# Patient Record
Sex: Male | Born: 1955 | Race: White | Hispanic: No | Smoking: Never smoker
Health system: Southern US, Community
[De-identification: ages and names within clinical notes are randomized; demographics above are authoritative.]

## PROBLEM LIST (undated history)

## (undated) DIAGNOSIS — E785 Hyperlipidemia, unspecified: Secondary | ICD-10-CM

## (undated) DIAGNOSIS — I2510 Atherosclerotic heart disease of native coronary artery without angina pectoris: Secondary | ICD-10-CM

## (undated) DIAGNOSIS — G629 Polyneuropathy, unspecified: Secondary | ICD-10-CM

## (undated) DIAGNOSIS — G8929 Other chronic pain: Secondary | ICD-10-CM

## (undated) DIAGNOSIS — I739 Peripheral vascular disease, unspecified: Secondary | ICD-10-CM

## (undated) DIAGNOSIS — F482 Pseudobulbar affect: Secondary | ICD-10-CM

## (undated) DIAGNOSIS — I639 Cerebral infarction, unspecified: Secondary | ICD-10-CM

## (undated) DIAGNOSIS — I82409 Acute embolism and thrombosis of unspecified deep veins of unspecified lower extremity: Secondary | ICD-10-CM

## (undated) DIAGNOSIS — E119 Type 2 diabetes mellitus without complications: Secondary | ICD-10-CM

## (undated) DIAGNOSIS — I219 Acute myocardial infarction, unspecified: Secondary | ICD-10-CM

## (undated) DIAGNOSIS — G5600 Carpal tunnel syndrome, unspecified upper limb: Secondary | ICD-10-CM

## (undated) DIAGNOSIS — M1990 Unspecified osteoarthritis, unspecified site: Secondary | ICD-10-CM

## (undated) DIAGNOSIS — E1169 Type 2 diabetes mellitus with other specified complication: Secondary | ICD-10-CM

## (undated) DIAGNOSIS — M25519 Pain in unspecified shoulder: Secondary | ICD-10-CM

## (undated) DIAGNOSIS — C4490 Unspecified malignant neoplasm of skin, unspecified: Secondary | ICD-10-CM

## (undated) DIAGNOSIS — H269 Unspecified cataract: Secondary | ICD-10-CM

## (undated) DIAGNOSIS — Z87442 Personal history of urinary calculi: Secondary | ICD-10-CM

## (undated) DIAGNOSIS — I10 Essential (primary) hypertension: Secondary | ICD-10-CM

## (undated) DIAGNOSIS — E669 Obesity, unspecified: Secondary | ICD-10-CM

## (undated) HISTORY — DX: Carpal tunnel syndrome, unspecified upper limb: G56.00

## (undated) HISTORY — DX: Pseudobulbar affect: F48.2

## (undated) HISTORY — PX: FEMOROPOPLITEAL THROMBECTOMY / EMBOLECTOMY: SUR432

## (undated) HISTORY — PX: SKIN CANCER EXCISION: SHX779

## (undated) HISTORY — DX: Atherosclerotic heart disease of native coronary artery without angina pectoris: I25.10

## (undated) HISTORY — PX: ORIF RADIUS & ULNA FRACTURES: SHX2129

## (undated) HISTORY — PX: POPLITEAL ARTERY STENT: SHX2243

## (undated) HISTORY — DX: Peripheral vascular disease, unspecified: I73.9

## (undated) HISTORY — DX: Type 2 diabetes mellitus with other specified complication: E11.69

## (undated) HISTORY — PX: LITHOTRIPSY: SUR834

## (undated) HISTORY — DX: Unspecified cataract: H26.9

## (undated) HISTORY — PX: FRACTURE SURGERY: SHX138

## (undated) HISTORY — PX: CYSTOSCOPY: SUR368

## (undated) HISTORY — PX: SHOULDER ARTHROSCOPY: SHX128

## (undated) HISTORY — PX: CORONARY ANGIOPLASTY: SHX604

## (undated) HISTORY — DX: Hyperlipidemia, unspecified: E78.5

## (undated) HISTORY — DX: Obesity, unspecified: E66.9

## (undated) HISTORY — PX: KNEE ARTHROSCOPY: SHX127

## (undated) HISTORY — DX: Acute myocardial infarction, unspecified: I21.9

---

## 1975-07-21 HISTORY — PX: APPENDECTOMY: SHX54

## 1989-03-20 HISTORY — PX: LAPAROSCOPIC CHOLECYSTECTOMY: SUR755

## 1998-08-15 ENCOUNTER — Ambulatory Visit (HOSPITAL_COMMUNITY): Admission: RE | Admit: 1998-08-15 | Discharge: 1998-08-15 | Payer: Self-pay | Admitting: Internal Medicine

## 1998-11-27 ENCOUNTER — Ambulatory Visit (HOSPITAL_COMMUNITY): Admission: RE | Admit: 1998-11-27 | Discharge: 1998-11-27 | Payer: Self-pay | Admitting: Internal Medicine

## 1999-03-21 HISTORY — PX: CARPAL TUNNEL RELEASE: SHX101

## 1999-04-07 ENCOUNTER — Ambulatory Visit (HOSPITAL_COMMUNITY): Admission: RE | Admit: 1999-04-07 | Discharge: 1999-04-07 | Payer: Self-pay | Admitting: Internal Medicine

## 1999-06-23 ENCOUNTER — Encounter: Admission: RE | Admit: 1999-06-23 | Discharge: 1999-06-23 | Payer: Self-pay | Admitting: Internal Medicine

## 1999-06-27 ENCOUNTER — Encounter: Payer: Self-pay | Admitting: Internal Medicine

## 1999-06-27 ENCOUNTER — Encounter: Admission: RE | Admit: 1999-06-27 | Discharge: 1999-06-27 | Payer: Self-pay | Admitting: Internal Medicine

## 1999-11-26 ENCOUNTER — Encounter: Admission: RE | Admit: 1999-11-26 | Discharge: 1999-11-26 | Payer: Self-pay | Admitting: Urology

## 1999-11-26 ENCOUNTER — Encounter: Payer: Self-pay | Admitting: Urology

## 1999-12-02 ENCOUNTER — Ambulatory Visit (HOSPITAL_COMMUNITY): Admission: RE | Admit: 1999-12-02 | Discharge: 1999-12-02 | Payer: Self-pay | Admitting: Urology

## 1999-12-04 ENCOUNTER — Encounter: Payer: Self-pay | Admitting: Urology

## 1999-12-04 ENCOUNTER — Ambulatory Visit (HOSPITAL_COMMUNITY): Admission: RE | Admit: 1999-12-04 | Discharge: 1999-12-04 | Payer: Self-pay | Admitting: Urology

## 1999-12-10 ENCOUNTER — Encounter: Payer: Self-pay | Admitting: Emergency Medicine

## 1999-12-10 ENCOUNTER — Emergency Department (HOSPITAL_COMMUNITY): Admission: EM | Admit: 1999-12-10 | Discharge: 1999-12-10 | Payer: Self-pay | Admitting: Emergency Medicine

## 1999-12-11 ENCOUNTER — Encounter: Payer: Self-pay | Admitting: Urology

## 1999-12-11 ENCOUNTER — Ambulatory Visit (HOSPITAL_COMMUNITY): Admission: RE | Admit: 1999-12-11 | Discharge: 1999-12-11 | Payer: Self-pay | Admitting: Hematology and Oncology

## 1999-12-16 ENCOUNTER — Encounter: Payer: Self-pay | Admitting: Urology

## 1999-12-16 ENCOUNTER — Ambulatory Visit (HOSPITAL_COMMUNITY): Admission: RE | Admit: 1999-12-16 | Discharge: 1999-12-16 | Payer: Self-pay | Admitting: Urology

## 2000-01-02 ENCOUNTER — Encounter: Payer: Self-pay | Admitting: Urology

## 2000-01-02 ENCOUNTER — Inpatient Hospital Stay (HOSPITAL_COMMUNITY): Admission: RE | Admit: 2000-01-02 | Discharge: 2000-01-06 | Payer: Self-pay | Admitting: Urology

## 2000-01-03 ENCOUNTER — Encounter: Payer: Self-pay | Admitting: Urology

## 2000-01-05 ENCOUNTER — Encounter: Payer: Self-pay | Admitting: Urology

## 2000-03-02 ENCOUNTER — Encounter: Admission: RE | Admit: 2000-03-02 | Discharge: 2000-03-02 | Payer: Self-pay | Admitting: *Deleted

## 2000-03-02 ENCOUNTER — Encounter: Payer: Self-pay | Admitting: *Deleted

## 2000-07-09 ENCOUNTER — Encounter: Payer: Self-pay | Admitting: Urology

## 2000-07-09 ENCOUNTER — Encounter: Admission: RE | Admit: 2000-07-09 | Discharge: 2000-07-09 | Payer: Self-pay | Admitting: Urology

## 2000-07-20 HISTORY — PX: CARDIAC CATHETERIZATION: SHX172

## 2000-07-20 HISTORY — PX: CORONARY ARTERY BYPASS GRAFT: SHX141

## 2001-02-17 DIAGNOSIS — I219 Acute myocardial infarction, unspecified: Secondary | ICD-10-CM

## 2001-02-17 HISTORY — DX: Acute myocardial infarction, unspecified: I21.9

## 2001-03-18 ENCOUNTER — Encounter: Admission: RE | Admit: 2001-03-18 | Discharge: 2001-03-18 | Payer: Self-pay | Admitting: Internal Medicine

## 2001-03-18 ENCOUNTER — Encounter: Payer: Self-pay | Admitting: Internal Medicine

## 2001-03-24 ENCOUNTER — Inpatient Hospital Stay (HOSPITAL_COMMUNITY): Admission: EM | Admit: 2001-03-24 | Discharge: 2001-03-30 | Payer: Self-pay | Admitting: *Deleted

## 2001-03-24 ENCOUNTER — Encounter: Payer: Self-pay | Admitting: Thoracic Surgery (Cardiothoracic Vascular Surgery)

## 2001-03-25 ENCOUNTER — Encounter: Payer: Self-pay | Admitting: Thoracic Surgery (Cardiothoracic Vascular Surgery)

## 2001-03-26 ENCOUNTER — Encounter: Payer: Self-pay | Admitting: Thoracic Surgery (Cardiothoracic Vascular Surgery)

## 2001-03-27 ENCOUNTER — Encounter: Payer: Self-pay | Admitting: Surgery

## 2001-04-05 ENCOUNTER — Encounter: Payer: Self-pay | Admitting: Urology

## 2001-04-05 ENCOUNTER — Encounter: Admission: RE | Admit: 2001-04-05 | Discharge: 2001-04-05 | Payer: Self-pay | Admitting: Urology

## 2001-04-26 ENCOUNTER — Encounter (HOSPITAL_COMMUNITY): Admission: RE | Admit: 2001-04-26 | Discharge: 2001-07-25 | Payer: Self-pay | Admitting: *Deleted

## 2002-01-26 ENCOUNTER — Encounter: Payer: Self-pay | Admitting: Emergency Medicine

## 2002-01-26 ENCOUNTER — Emergency Department (HOSPITAL_COMMUNITY): Admission: AC | Admit: 2002-01-26 | Discharge: 2002-01-26 | Payer: Self-pay | Admitting: Emergency Medicine

## 2003-09-05 ENCOUNTER — Encounter: Admission: RE | Admit: 2003-09-05 | Discharge: 2003-09-05 | Payer: Self-pay | Admitting: Orthopedic Surgery

## 2004-02-12 ENCOUNTER — Ambulatory Visit (HOSPITAL_COMMUNITY): Admission: RE | Admit: 2004-02-12 | Discharge: 2004-02-12 | Payer: Self-pay | Admitting: Urology

## 2004-02-12 ENCOUNTER — Ambulatory Visit (HOSPITAL_BASED_OUTPATIENT_CLINIC_OR_DEPARTMENT_OTHER): Admission: RE | Admit: 2004-02-12 | Discharge: 2004-02-12 | Payer: Self-pay | Admitting: Urology

## 2004-02-14 ENCOUNTER — Ambulatory Visit (HOSPITAL_COMMUNITY): Admission: RE | Admit: 2004-02-14 | Discharge: 2004-02-14 | Payer: Self-pay | Admitting: Urology

## 2004-02-23 ENCOUNTER — Emergency Department (HOSPITAL_COMMUNITY): Admission: EM | Admit: 2004-02-23 | Discharge: 2004-02-23 | Payer: Self-pay | Admitting: Emergency Medicine

## 2004-11-26 ENCOUNTER — Encounter: Admission: RE | Admit: 2004-11-26 | Discharge: 2004-11-26 | Payer: Self-pay | Admitting: Internal Medicine

## 2005-01-13 ENCOUNTER — Encounter: Admission: RE | Admit: 2005-01-13 | Discharge: 2005-01-13 | Payer: Self-pay | Admitting: *Deleted

## 2005-01-15 ENCOUNTER — Ambulatory Visit (HOSPITAL_COMMUNITY): Admission: RE | Admit: 2005-01-15 | Discharge: 2005-01-15 | Payer: Self-pay | Admitting: *Deleted

## 2005-02-04 ENCOUNTER — Encounter: Admission: RE | Admit: 2005-02-04 | Discharge: 2005-02-04 | Payer: Self-pay | Admitting: Internal Medicine

## 2007-03-16 ENCOUNTER — Emergency Department (HOSPITAL_COMMUNITY): Admission: EM | Admit: 2007-03-16 | Discharge: 2007-03-17 | Payer: Self-pay | Admitting: Emergency Medicine

## 2009-07-29 ENCOUNTER — Encounter: Admission: RE | Admit: 2009-07-29 | Discharge: 2009-07-29 | Payer: Self-pay | Admitting: Cardiovascular Disease

## 2009-08-02 ENCOUNTER — Inpatient Hospital Stay (HOSPITAL_COMMUNITY): Admission: RE | Admit: 2009-08-02 | Discharge: 2009-08-03 | Payer: Self-pay | Admitting: Cardiovascular Disease

## 2009-11-18 ENCOUNTER — Ambulatory Visit (HOSPITAL_COMMUNITY): Admission: RE | Admit: 2009-11-18 | Discharge: 2009-11-19 | Payer: Self-pay | Admitting: Cardiovascular Disease

## 2009-11-22 ENCOUNTER — Emergency Department (HOSPITAL_COMMUNITY): Admission: EM | Admit: 2009-11-22 | Discharge: 2009-11-22 | Payer: Self-pay | Admitting: Emergency Medicine

## 2009-11-22 ENCOUNTER — Ambulatory Visit: Payer: Self-pay | Admitting: Vascular Surgery

## 2010-06-16 ENCOUNTER — Encounter: Admission: RE | Admit: 2010-06-16 | Discharge: 2010-06-16 | Payer: Self-pay | Admitting: Cardiovascular Disease

## 2010-06-23 ENCOUNTER — Ambulatory Visit (HOSPITAL_COMMUNITY)
Admission: RE | Admit: 2010-06-23 | Discharge: 2010-06-24 | Payer: Self-pay | Source: Home / Self Care | Admitting: Cardiovascular Disease

## 2010-07-07 ENCOUNTER — Inpatient Hospital Stay (HOSPITAL_COMMUNITY)
Admission: AD | Admit: 2010-07-07 | Discharge: 2010-07-10 | Payer: Self-pay | Attending: Cardiovascular Disease | Admitting: Cardiovascular Disease

## 2010-09-29 LAB — GLUCOSE, CAPILLARY
Glucose-Capillary: 149 mg/dL — ABNORMAL HIGH (ref 70–99)
Glucose-Capillary: 226 mg/dL — ABNORMAL HIGH (ref 70–99)
Glucose-Capillary: 249 mg/dL — ABNORMAL HIGH (ref 70–99)
Glucose-Capillary: 288 mg/dL — ABNORMAL HIGH (ref 70–99)
Glucose-Capillary: 378 mg/dL — ABNORMAL HIGH (ref 70–99)
Glucose-Capillary: 220 mg/dL — ABNORMAL HIGH (ref 70–99)

## 2010-09-29 LAB — DIFFERENTIAL
Basophils Absolute: 0 10*3/uL (ref 0.0–0.1)
Eosinophils Absolute: 0.1 10*3/uL (ref 0.0–0.7)
Monocytes Absolute: 0.6 10*3/uL (ref 0.1–1.0)
Neutro Abs: 4.5 10*3/uL (ref 1.7–7.7)

## 2010-09-29 LAB — BASIC METABOLIC PANEL
BUN: 10 mg/dL (ref 6–23)
BUN: 15 mg/dL (ref 6–23)
BUN: 13 mg/dL (ref 6–23)
CO2: 25 mEq/L (ref 19–32)
CO2: 28 mEq/L (ref 19–32)
CO2: 26 mEq/L (ref 19–32)
Calcium: 8.4 mg/dL (ref 8.4–10.5)
Calcium: 8.4 mg/dL (ref 8.4–10.5)
Chloride: 104 mEq/L (ref 96–112)
Chloride: 110 mEq/L (ref 96–112)
Chloride: 104 mEq/L (ref 96–112)
Creatinine, Ser: 0.72 mg/dL (ref 0.4–1.5)
Creatinine, Ser: 0.81 mg/dL (ref 0.4–1.5)
Creatinine, Ser: 0.87 mg/dL (ref 0.4–1.5)
Creatinine, Ser: 0.68 mg/dL (ref 0.4–1.5)
GFR calc Af Amer: >60 mL/min (ref >60)
GFR calc Af Amer: >60 mL/min (ref >60)
GFR calc Af Amer: >60 mL/min (ref >60)
GFR calc non Af Amer: >60 mL/min (ref >60)
Glucose, Bld: 217 mg/dL — ABNORMAL HIGH (ref 70–99)
Potassium: 3.9 mEq/L (ref 3.5–5.1)
Potassium: 4.2 mEq/L (ref 3.5–5.1)

## 2010-09-29 LAB — PROTIME-INR
INR: 0.94 (ref 0.00–1.49)
INR: 0.95 (ref 0.00–1.49)

## 2010-09-29 LAB — HEPATIC FUNCTION PANEL
Albumin: 2.7 g/dL — ABNORMAL LOW (ref 3.5–5.2)
Indirect Bilirubin: 0.9 mg/dL (ref 0.3–0.9)
Total Protein: 5.1 g/dL — ABNORMAL LOW (ref 6.0–8.3)

## 2010-09-29 LAB — MAGNESIUM
Magnesium: 2 mg/dL (ref 1.5–2.5)
Magnesium: 2 mg/dL (ref 1.5–2.5)
Magnesium: 2 mg/dL (ref 1.5–2.5)

## 2010-09-29 LAB — URINALYSIS, ROUTINE W REFLEX MICROSCOPIC
Bilirubin Urine: NEGATIVE
Glucose, UA: 500 mg/dL — AB
Glucose, UA: >1000 mg/dL — AB
Hgb urine dipstick: NEGATIVE
Ketones, ur: NEGATIVE mg/dL
Leukocytes, UA: NEGATIVE
Nitrite: NEGATIVE
Specific Gravity, Urine: >1.046 — ABNORMAL HIGH (ref 1.005–1.030)
Urobilinogen, UA: 0.2 mg/dL (ref 0.0–1.0)
Urobilinogen, UA: 1 mg/dL (ref 0.0–1.0)

## 2010-09-29 LAB — URINE MICROSCOPIC-ADD ON

## 2010-09-29 LAB — CBC
HCT: 41.6 % (ref 39.0–52.0)
HCT: 42.1 % (ref 39.0–52.0)
HCT: 40.5 % (ref 39.0–52.0)
Hemoglobin: 14.1 g/dL (ref 13.0–17.0)
Hemoglobin: 13.8 g/dL (ref 13.0–17.0)
MCH: 28.5 pg (ref 26.0–34.0)
MCHC: 34.1 g/dL (ref 30.0–36.0)
MCV: 84 fL (ref 78.0–100.0)
MCV: 84 fL (ref 78.0–100.0)
Platelets: 203 10*3/uL (ref 150–400)
Platelets: 233 10*3/uL (ref 150–400)
Platelets: 240 10*3/uL (ref 150–400)
RBC: 5.01 MIL/uL (ref 4.22–5.81)
RDW: 13.1 % (ref 11.5–15.5)
WBC: 7.3 10*3/uL (ref 4.0–10.5)
WBC: 8 10*3/uL (ref 4.0–10.5)
WBC: 8.6 10*3/uL (ref 4.0–10.5)

## 2010-09-29 LAB — COMPREHENSIVE METABOLIC PANEL
ALT: 13 U/L (ref 0–53)
AST: 17 U/L (ref 0–37)
Albumin: 3.5 g/dL (ref 3.5–5.2)
BUN: 13 mg/dL (ref 6–23)
GFR calc Af Amer: >60 mL/min (ref >60)
Glucose, Bld: 463 mg/dL — ABNORMAL HIGH (ref 70–99)
Potassium: 4.4 mEq/L (ref 3.5–5.1)
Total Bilirubin: 0.6 mg/dL (ref 0.3–1.2)
Total Protein: 6.7 g/dL (ref 6.0–8.3)

## 2010-09-29 LAB — CARDIAC PANEL(CRET KIN+CKTOT+MB+TROPI)
CK, MB: 1.6 ng/mL (ref 0.3–4.0)
Relative Index: INVALID (ref 0.0–2.5)
Total CK: 65 U/L (ref 7–232)

## 2010-09-29 LAB — APTT: aPTT: 27 seconds (ref 24–37)

## 2010-09-29 LAB — LACTATE DEHYDROGENASE: LDH: 531 U/L — ABNORMAL HIGH (ref 94–250)

## 2010-09-29 LAB — MRSA PCR SCREENING: MRSA by PCR: NEGATIVE

## 2010-10-04 LAB — CBC
HCT: 41.5 % (ref 39.0–52.0)
Hemoglobin: 14.5 g/dL (ref 13.0–17.0)
MCHC: 35.2 g/dL (ref 30.0–36.0)
MCV: 87.1 fL (ref 78.0–100.0)
MCV: 87.1 fL (ref 78.0–100.0)
Platelets: 201 10*3/uL (ref 150–400)
RDW: 12.6 % (ref 11.5–15.5)
RDW: 12.7 % (ref 11.5–15.5)
WBC: 8.2 10*3/uL (ref 4.0–10.5)

## 2010-10-04 LAB — BASIC METABOLIC PANEL
BUN: 14 mg/dL (ref 6–23)
BUN: 14 mg/dL (ref 6–23)
CO2: 26 mEq/L (ref 19–32)
Calcium: 9 mg/dL (ref 8.4–10.5)
Chloride: 101 mEq/L (ref 96–112)
Chloride: 102 mEq/L (ref 96–112)
Creatinine, Ser: 0.71 mg/dL (ref 0.4–1.5)
GFR calc non Af Amer: >60 mL/min (ref >60)
Glucose, Bld: 255 mg/dL — ABNORMAL HIGH (ref 70–99)
Glucose, Bld: 287 mg/dL — ABNORMAL HIGH (ref 70–99)
Potassium: 4.3 mEq/L (ref 3.5–5.1)
Sodium: 133 mEq/L — ABNORMAL LOW (ref 135–145)

## 2010-10-04 LAB — PROTIME-INR
INR: 0.89 (ref 0.00–1.49)
Prothrombin Time: 12 seconds (ref 11.6–15.2)

## 2010-10-04 LAB — GLUCOSE, CAPILLARY

## 2010-10-07 LAB — POCT I-STAT, CHEM 8
Chloride: 105 mEq/L (ref 96–112)
Glucose, Bld: 182 mg/dL — ABNORMAL HIGH (ref 70–99)
HCT: 44 % (ref 39.0–52.0)
Hemoglobin: 15 g/dL (ref 13.0–17.0)
Potassium: 4.6 mEq/L (ref 3.5–5.1)
Sodium: 139 mEq/L (ref 135–145)

## 2010-10-07 LAB — BASIC METABOLIC PANEL
CO2: 25 mEq/L (ref 19–32)
Calcium: 8.7 mg/dL (ref 8.4–10.5)
Creatinine, Ser: 0.66 mg/dL (ref 0.4–1.5)
GFR calc Af Amer: >60 mL/min (ref >60)
Sodium: 137 mEq/L (ref 135–145)

## 2010-10-07 LAB — GLUCOSE, CAPILLARY
Glucose-Capillary: 154 mg/dL — ABNORMAL HIGH (ref 70–99)
Glucose-Capillary: 166 mg/dL — ABNORMAL HIGH (ref 70–99)
Glucose-Capillary: 95 mg/dL (ref 70–99)

## 2010-10-07 LAB — CBC
Hemoglobin: 14 g/dL (ref 13.0–17.0)
RBC: 4.59 MIL/uL (ref 4.22–5.81)
WBC: 8.9 10*3/uL (ref 4.0–10.5)

## 2010-12-02 NOTE — Procedures (Signed)
[name], [name]                   ACCOUNT NO.:  [id number]   MEDICAL RECORD NO.:  [id number]          PATIENT TYPE:  INP   LOCATION:  2807                         FACILITY:  [facility]   PHYSICIAN:  Nanetta Batty, M.D.   DATE OF BIRTH:  20-Nov-1955   DATE OF PROCEDURE:  08/02/2009  DATE OF DISCHARGE:                    PERIPHERAL VASCULAR INVASIVE PROCEDURE   PROCEDURE:  Peripheral angiogram / percutaneous transluminal angioplasty  stent report.   HISTORY:  [name] is a delightful 55 year old mildly overweight widowed  white male father of two, grandfather to two grandchildren who was  referred by Dr. Patsy Lager for left lower extremity claudication.  His  cardiologist is Dr. Armanda Magic who he saw over a year ago.  He has had  coronary artery bypass graft surgery back in 2002 and has documented  occluded grafts.  He has had an ERCP in the past.  He is also positive  for hypertension, hyperlipidemia and insulin dependent diabetes.  Dopplers in our office suggested an occluded left popliteal with a right  ABI of 1.07 and a left at 0.57.  He presents now for angiography and  potential intervention for lifestyle limiting claudication.   DESCRIPTION OF PROCEDURE:  The patient was brought to the second floor  of the [facility] suite in post absorptive state.  He  was premedicated with p.o. Valium, IV Versed and fentanyl.  His right  groin was prepped and shaved in the usual sterile fashion.  Xylocaine 1%  was used for local anesthesia.  A 5 French sheath was inserted into the  right femoral artery using standard Seldinger technique.  A 5 French  pigtail catheter was used for abdominal aortography with bifemoral  runoff using digital subtraction with step table bolus chase technique.  Visipaque dye was used for the entirety of the case.  Retrograde aortic  pressure was monitored during the case.   ANGIOGRAPHIC RESULTS:  1. Abdominal aorta.      a.     Renal arteries -  normal.      b.     Infrarenal abdominal aorta - normal.  2. Left lower extremity.      a.     Short segment occlusion left above the knee popliteal with       three vessel runoff.  3. Right lower extremity.      a.     60% segmental right above the knee popliteal with two vessel       runoff.  The anterior tibial was occluded.   IMPRESSION:  [name] has an occluded above the knee popliteal.  We will  proceed with attempted PTCA and stenting.   Contralateral access was obtained with a 5 [place] crossover catheter.  The sheath was then exchanged over a Wholey wire for a 6 [place]  crossover sheath.  The patient received 3000 units of heparin  intravenously.  The [facility] wire was then advanced to the point of  occlusion and exchanged for an Ental pull across catheter.  Using a long  3-5 angled Glidewire I was able to cross the occlusion and document that  I remained intravascular with the wire in the below the knee pop.  This  had been exchanged for the [facility] wire and QT was performed with a 4 x  80 Powerflex.  Following this the above the knee pop was stented with a  5 x 60 Abbott precise Nitinol self-expanding stent making sure that the  stent did not cross the flexion point.  Arterial nitroglycerin 200 mcg  was administered.  Post dilatation was performed with 5-4 Powerflex  resulting in reduction of first segment total occlusion.  Change that to  [employer].  Short segment total occlusion at zero percent residual two  vessel runoff.  The patient tolerated the procedure well.  He was given  4 baby aspirin and 300 mg of p.o. Plavix along with 25 mg of IV Pepcid.  He left the lab in stable condition.  The long crossover sheath was  exchanged for a short 6 [place] sheath.  The sheaths will  be removed and pressure will be held on the groin to achieve hemostasis  __________.  The patient will be discharged home in the morning and will  get followup Dopplers and ABIs after which he will see  me back in  followup.  He will remain on aspirin and Plavix.      Nanetta Batty, M.D.  Electronically Signed     JB/MEDQ  D:  08/02/2009  T:  08/02/2009  Job:  [id number]   cc:   [facility]  Pearline Cables, MD

## 2010-12-02 NOTE — Consult Note (Signed)
[name], [name]                   ACCOUNT NO.:  [id number]   MEDICAL RECORD NO.:  [id number]          PATIENT TYPE:  EMS   LOCATION:  ED                           FACILITY:  [facility]   PHYSICIAN:  Hollice Espy, M.D.DATE OF BIRTH:  [birth year]   DATE OF CONSULTATION:  03/17/2007  DATE OF DISCHARGE:  03/17/2007                                 CONSULTATION   PRIMARY CARE PHYSICIAN:  Candyce Churn, M.D.   CHIEF COMPLAINT:  Abdominal pain.   HISTORY OF PRESENT ILLNESS:  The patient is a 55 year old white male  with past medical history of CAD with ongoing angina as well as obesity  and diabetes mellitus who was doing otherwise well when he had lunch  earlier on in the day on August 27 and started feeling some fullness in  his mid epigastric area.  He went to the restroom and was unable to move  his bowels, but made himself vomit a few hours later which he said  continued to improve.  He started having some form of some pain in the  midepigastric area and extending down into the rest of his belly.  When  the patient tried to eat some Jell-O in the evening, he started feeling  worse, the pain returned severely, and he started throwing up.  When he  came into the emergency room for further evaluation.  In the emergency  room, he underwent plain films which showed dilated air fluid levels in  the small bowel consistent with a possible small bowel obstruction,  although, difficult to say because of the patient's obesity.  CT scan  was done.  The patient was also noted to have a white count of 15.2.  At  this point the ER attending contacted myself and felt the patient, even  though he was starting to feel better in the emergency room, warranted  an overnight observation.  When the patient underwent his CT scan in the  early morning hours of March 17, 2007.  Soon after the CT scan,  however, he said he started feeling better and went to the bathroom in  the emergency room and moved  his bowels quite a bit.  He said this  tremendously relieved his symptoms and he no longer had any pain.  After  hearing this, the patient was given a meal to eat which he was able to  tolerate without any kind of nausea, vomiting, or belly pain.  In the  meantime, his CT scan came back and showed very early signs of small  bowel obstruction but looked to not be severe.  The patient is currently  feeling better, denies any headaches, vision changes, dysphagia, chest  pain, palpitations, shortness of breath, wheezing, coughing.  He has  some residual abdominal soreness, but he says nothing acute.  He denies  any hematuria, dysuria, or diarrhea.  He just moved his bowel, so he  says that his constipation is no longer an issue.  He denies any focal  extremity numbness, weakness, or pain.   REVIEW OF SYSTEMS:  Otherwise negative.  PAST MEDICAL HISTORY:  CABG, appendectomy, CAD, carpal tunnel release,  cholecystectomy, diabetes, hyperlipidemia, and ongoing CAD and he was  supposed to undergo heart surgery after failed catheterization attempt,  but has not had this done.  He also has a history of lithotripsy.   MEDICATIONS:  1. Micardis 80 mg.  2. Lantus 35 subcutaneous q.h.s.  3. IMDUR 30 mg.  4. Plavix 75 mg.  5. Vytorin 10/20 mg daily.  6. Metformin 1000 b.i.d.  7. Tricor 145 mg.  8. Norvasc 2.5.   ALLERGIES:  No known drug allergies.   SOCIAL HISTORY:  He denies any tobacco, alcohol, or drug use.   FAMILY HISTORY:  Noncontributory.   PHYSICAL EXAMINATION:  VITAL SIGNS:  Blood pressure 162/97, respirations  17, pulse 86, and temperature 97.8.  Systolic blood pressure in the  140s.   EKG notes some inverted T waves laterally, RSR pattern V1 and V2.  UA  shows greater than 1000 glucose, but only 0-2 red cells.  White count  15.2, H&H 15 and 43, MCV 341, 82% neutrophils.  CT and abdominal x-rays  are as per HPI.   ASSESSMENT:  1. Resolved small bowel obstruction.  Given the  fact that he showed      very early sign of obstruction on CT and x-ray and then the fact      the patient was able to move bowels and tolerate p.o. without any      complaint, he was amenable to the plan of being discharged home      with follow-up in the next 12 to 36 hours with his PCP, Dr. Kevan Ny.      I have advised the patient that should he start having abdominal      pain, nausea or vomiting, to return back to the emergency room      which he tells me he will indeed do so.  2. Coronary artery disease, ongoing.  We will continue his      medications.  3. Diabetes mellitus.  Continue Lantus.  4. Leukocytosis.  Likely stress related secondary to his small bowel      obstruction.  We will plan to repeat lab work in Dr. Kevan Ny office.      Hollice Espy, M.D.  Electronically Signed     SKK/MEDQ  D:  03/17/2007  T:  03/17/2007  Job:  [id number]   cc:   Candyce Churn, M.D.  Fax: [phone]

## 2010-12-02 NOTE — Procedures (Signed)
[name], [name]                   ACCOUNT NO.:  [id number]   MEDICAL RECORD NO.:  [id number]          PATIENT TYPE:  OIB   LOCATION:  6527                         FACILITY:  [facility]   PHYSICIAN:  Nanetta Batty, M.D.   DATE OF BIRTH:  Sep 29, 1955   DATE OF PROCEDURE:  06/23/2010  DATE OF DISCHARGE:                    PERIPHERAL VASCULAR INVASIVE PROCEDURE    [name] is a delightful 55 year old mildly overweight, widowed white  male, father of 3, grandfather of 2 grandchildren, who I last saw in the  office on November 15.  He has a history of CAD status post bypass  grafting x4 in 2002 after myocardial infarction.  He has been  catheterized since and has had 2 occluded grafts, has undergone EECP.  His other problems include remote tobacco abuse, dyslipidemia, diabetes,  and hypertension.  I initially saw him for claudication with left ABI  0.57.  He was found to have an occluded left popliteal angiographically  on August 02, 2009.  Ultimately, we stented him with marked improvement  in his symptoms on his Dopplers with some ABI looking greater than 1.  Subsequent to that, he developed recurrent symptoms.  He was re-  angiogramed on May 2 revealing 80% restenosis which I  angioplastied/atherectomized using cutting balloon.  His ABI increased  to 0.98.  He returns now for recurrent symptoms, an ABI of 0.81 and high-  frequency signal within the stent with the intent of using a Viabahn  self-expanding coverage stent to prevent in-stent restenosis.   PROCEDURE DESCRIPTION:  The patient was brought to the second floor  [facility] in the post absorptive state.  He was  premedicated with p.o. Valium, IV Versed, and fentanyl.  His right groin  was prepped and shaved in the usual sterile fashion.  Xylocaine 1% was  used for local anesthesia.  A short 6-French sheath was inserted into  the right femoral artery using standard Seldinger technique.  A  contralateral  access was obtained with a 5-French crossover catheter,  Versacore, and angled Glidewire.  A 6-French crossover sheath was then  advanced over the bifurcation.  The patient received 5000 units of  heparin intravenously with an ACT of 240 at the beginning of the case  and 190 at the end of the case.  The Versacore wire was then advanced to  the point of the popliteal through a 5-French 90-cm long end-hole  catheter.  This was then exchanged for a 300-cm long Spartacore wire  0.014.  PTA was performed with a 5 x 6 Sterling over-the-wire balloon at  8 atmospheres.  Stenting was then performed with a 6 x 100-mm length  Viabahn covered stent on 0.018 platform and postdilated with a same 5 x  6 balloon at 6-8 atmospheres resulting reduction of 99% in-stent  restenosis to 0% residual.  There was two-vessel runoff performed after  the case.  The patient tolerated the procedure well.  The sheath was  then withdrawn across the bifurcation over the [facility] wire and the  patient left lab in stable condition.  He was on aspirin and Plavix.  IMPRESSION:  Successful percutaneous transluminal angioplasty and  stenting using Gore Viabahn covered stent for a second episode of in-  stent restenosis.  The patient will be treated on aspirin and Plavix,  gently hydrated, discharged home in the morning.  I will get follow up  Dopplers and ABIs.  We will see him back in the office.  He left lab in  stable condition.      Nanetta Batty, M.D.      JB/MEDQ  D:  06/23/2010  T:  06/24/2010  Job:  [id number]   cc:   Second [facility]  [facility]  Dorisann Frames, M.D.  Candyce Churn, M.D.   Electronically Signed by Nanetta Batty M.D. on 07/06/2010 03:00:49 PM

## 2010-12-02 NOTE — Procedures (Signed)
[name], [name]                   ACCOUNT NO.:  [id number]   MEDICAL RECORD NO.:  [id number]          PATIENT TYPE:  INP   LOCATION:  2503                         FACILITY:  [facility]   PHYSICIAN:  Nanetta Batty, M.D.   DATE OF BIRTH:  04-20-1956   DATE OF PROCEDURE:  DATE OF DISCHARGE:                    PERIPHERAL VASCULAR INVASIVE PROCEDURE   [name] is a 55 year old mildly overweight widowed white male, father of  three, grandfather of two grandchildren, who I recently saw November 12, 2009.  He has a history of coronary artery bypass grafting x4 in 2002  after myocardial infarction.  He has other problems including marked  tobacco abuse, dyslipidemia, diabetes, and hypertension.  I stented his  left popliteal using a 5 x 6 absolute Pro Nitinol self-expanding stent  on August 02, 2009 which improved his symptoms and ABIs.  Recently he  has noticed increasing claudication with a decrease in his left ABI from  1.06 to 0.89 with high frequency signal in his left popliteal.  He  presents now for angiography and potentially intervention.   DESCRIPTION OF PROCEDURE:  The patient was brought to the 2nd floor  [facility] suite in the postabsorptive state.  He was  premedicated with p.o. Valium, IV Versed, and fentanyl.  His right groin  was prepped and shaved in the usual sterile fashion.  Xylocaine 1% was  used for local anesthesia.  Five upgrade to a 7 [place] crossover sheath  was inserted into the right femoral artery using standard Seldinger  technique.  A 5 [place] tennis racket catheter was used for abdominal  aortography with bifemoral runoff using digital subtraction  bolus chase  stop table technique.  Visipaque dye was used for the entirety of the  case.  Retrograde aortic pressure was monitored during the case.   ANGIOGRAPHIC RESULTS:  1. Left lower extremity.      a.     90% in-stent restenosis within the left popliteal stent.      b.     80% focal stenosis,  chest PI in the stented segment with 2-       vessel runoff.  2. Right lower extremity.      a.     60% segmental right popliteal with 2-vessel runoff.   The patient received 3000 units of heparin intravenously.  Using an 0.14  long Spartacore wire along with a 5 x 4 AngioSculpt atherectomy balloon,  a cutting balloon atherectomy was performed.  Overlapping inflations to  10 atmospheres were performed, resulting in reduction of a 90% in-stent  restenosis to less than 20%.  He did have a focal lesion just beyond  the stent which was also AngioSculpt at 6 atmospheres, resulting in  reduction of an 80% stenosis to 0% residual.   IMPRESSION:  Successful cutting balloon atherectomy of a previously  placed absolute Nitinol self-expanding stent 5 months ago within the  popliteal artery on the left with aggressive early restent restenosis  and successful cutting balloon atherectomy.  The guide wire was removed  and the sheath was pulled across the iliac bifurcation.  The  patient left the lab in stable condition.  The sheath will be removed  once the ACT falls below 200.  The patient will be gently hydrated  overnight, treated with aspirin and Plavix, and discharged home in the  morning, to follow up with Dopplers and ABIs.  Will see him back in the  office for follow-up.  He left the lab in stable condition.      Nanetta Batty, M.D.     JB/MEDQ  D:  11/18/2009  T:  11/19/2009  Job:  [id number]   cc:   [facility]  [name], M.D.   Electronically Signed by Nanetta Batty M.D. on 12/03/2009 03:25:39 PM

## 2010-12-02 NOTE — Procedures (Signed)
[name], [name]                   ACCOUNT NO.:  [id number]   MEDICAL RECORD NO.:  [id number]          PATIENT TYPE:  INP   LOCATION:  2906                         FACILITY:  [facility]   PHYSICIAN:  Nanetta Batty, M.D.   DATE OF BIRTH:  Apr 21, 1956   DATE OF PROCEDURE:  DATE OF DISCHARGE:                    PERIPHERAL VASCULAR INVASIVE PROCEDURE    PROCEDURE:  Peripheral angiogram/AngioJet rheolytic mechanical  thrombectomy/power-pulse spray pharmacologic thrombectomy, PTA and  stent.   HISTORY:  [name] is a 55 year old mildly overweight widowed white male,  father of three, grandfather of two grandchildren with a history of CAD,  status post bypass grafting x4 after myocardial infarction in 2002.  He  apparently has been catheterized since that time and was found to have  two occluded grafts and has undergone EECP in the past visit.   His other problems include remote tobacco abuse, diabetes, dyslipidemia,  and hypertension.  I recently saw him for evaluation of claudication.  Left ABI of 0.57 with an occluded popliteal, which I angiogrammed and  intervened in January of this year with PTA and stenting.  He had  recurrent symptoms in May.  I re-angiogrammed him, revealing 80% in-  stent restenosis, and I performed cutting balloon atherectomy with  excellent result.  He came back several weeks ago with recurrent in-  stent restenosis and underwent Viabahn endoprosthesis implantation with  excellent result.  Followup Dopplers a week or two later were normal.  Four days ago, he developed acute left calf pain and was dopplered in  our office yesterday, revealing an occluded SFA.  He was admitted from  the office, heparinized, and presents now for angiography and  endovascular recanalization.   PROCEDURE IN DETAIL:  The patient was brought to the second floor [facility] angi[facility] suite in the post-absorptive state.  He was  premedicated with p.o. Valium, IV Versed, and fentanyl.   His right leg  was prepped and shaved in the usual sterile fashion; 1% Xylocaine was  used for local anesthesia.  A 6-French cross-over sheath was inserted  into the right femoral artery using standard Seldinger technique.  Contralateral access was obtained with a cross-over catheter, glide  wire, Versacore wire.  The patient received Angiomax bolus and drip with  an ACT of 295.   The thrombosed segment was crossed with a stiff-angled glide wire and an  0.035 Quick-Cross catheter.  Once I crossed into the popliteal below the  knee after angiogram had revealed the occlusion, I exchanged for a NAV6  distal protection wire and then placed a large NAV6 distal protection  filter below the knee because of the large thrombus burden.  Following  this, Angiojet was performed to debulk and then power-pulse spray with  10 mg of TNK which was allowed to permeate the clot for 15 minutes.  Power pulse spray was then continued and 200 mcg of intra-arterial  nitroglycerin was administered.  There was excellent angiographic  result; however, there was residual disease just above the previously  stented segment, and this was re-stented with a 6 x 6 Abbott Absolute  Pro Nitinol Self-Expanding  stent and postdilated to 5 x 6 balloon.  There was thrombosis initially of the posterior tibial; however,  ultimately this was recanalized with thrombolysis.  The patient did  receive 600 mg of Plavix prior to getting off the table.  The sheath was  then withdrawn across the bifurcation after completion of the angiogram  was performed and exchanged for a short 6-French sheath.   IMPRESSION:  Successful AngioJet mechanical thrombectomy, power pulse  spray pharmacologic thrombectomy, PTA and stenting of an occluded  recently placed Viabahn stent.  The patient does have a 50% segmental  popliteal lesion beyond the knee.  It is unclear what the etiology of  the thrombosis was.  I am going to continue Angiomax at  a  reduced dose, overnight for 18 hours, given his thrombus burden, and  we will remove the sheath in the morning and treat him with aspirin and  Plavix.  He will probably go home at 48 hours at which time he will get  followup Dopplers.  He left the lab in stable condition with palpable  pedal pulse.      Nanetta Batty, M.D.      JB/MEDQ  D:  07/08/2010  T:  07/09/2010  Job:  [id number]   cc:   [facility]  Candyce Churn, M.D.   Electronically Signed by Nanetta Batty M.D. on 08/06/2010 05:39:44 PM

## 2010-12-05 NOTE — Discharge Summary (Signed)
[facility]  Patient:    [name], [name]                          MRN: [id number] Adm. Date:  [id number] Disc. Date: [id number] Attending:  Londell Moh CC:         Jamison Neighbor, M.D.                           Discharge Summary  ADMISSION DIAGNOSIS:  Renal calculus.  DISCHARGE DIAGNOSIS:  Renal calculus.  PRINCIPAL PROCEDURE:  Percutaneous nephrolithrotripsy and intraoperative flexible ureteroscopy report on January 02, 2000.  HISTORY:  This 55 -year-old male had ESWL and had stone fragments present. The patient did not tolerate her double-J catheter well.  It became somewhat obstructed hence removal of the stent.  The patient had a percutaneous nephrostomy tube placed and _____ undergo intraoperative lithotripsy removal of the residual stones.  The patient underwent nephrolithotripsy and had the stone fragments removed. His postoperative course unremarkable.  Foley catheter was removed.  The patient had some leakage around the nephrostomy tube.  When his catheter was plugged he developed increasing pain and it was felt that he was obstructed. The patient had an initial nephrostogram that showed what was first thought to be some filling defects in the distal ureter.  _____ it was felt that was most likely due to blood.  The patient had his nephrostomy tube clamped for 24 hours and was ready for discharge on January 06, 2000.  He was pain free at that time.  The nephrostomy tube was removed, a dressing was applied and the patient was sent home with a prescription for Levaquin as well as Lorcet.  He will continue on allopurinol and Polycitra to try to resolve his remaining stones which were felt to be uric acid, return to the office in follow-up in 2-3 weeks time. DD:  01/20/00 TD:  01/20/00 Job: [id number]

## 2010-12-05 NOTE — Discharge Summary (Signed)
[place]. [facility] [address]  Patient:    [name], [name] Visit Number: [id number] MRN: [id number]          Service Type: MED Location: 2000 2033 01 Attending Physician:  Charlett Lango Dictated by:   Areta Haber, P.A.C. Admit Date:  03/24/2001 Disc. Date: 03/30/01   CC:         Meade Maw, M.D.  Pearla Dubonnet, M.D.   Discharge Summary  HISTORY OF PRESENT ILLNESS:  This is a 55 year old male who has actually noted the onset of chest pain and decrease in exercise tolerance for the past several months.  In the recent past three weeks, however, the chest pain and shortness of breath have become progressively worse.  He is now unable to perform his job without resting frequently. The past week prior to admission while at the beach, he noted onset of chest tightness associated with left arm pain and shortness of breath.  He presented to the emergency room for evaluation but left prior to completion of the full work-up.  He was subsequently referred to Meade Maw, M.D., for further evaluation including stress testing.  On full Bruce stress Cardiolite at four minutes, the patient had his typical chest symptoms and developed ECG changes.  Cardiolite images had revealed decreased uptake in the anterior, anterolateral, as well as the inferior apical wall.  He was referred for cardiac catheterization which revealed a 90% proximal LAD lesion, a total occlusion of his mid and left circumflex before an OM-II which can be faintly seen filling via collaterals as well as a 90% ostial right coronary stenosis.  Due to these findings, the patient was felt to be a surgical revascularization candidate and consultation was obtained with Viviann Spare C. Dorris Fetch, M.D., who evaluated the patient and the studies and agreed with the recommendations for the procedure.  PAST MEDICAL HISTORY: 1. Type 2 diabetes mellitus. 2. Dyslipidemia and severe hypertriglyceridemia with  a previous level of    triglycerides measured at more than 4000. 3. History of proteinuria. 4. History of nephrolithiasis. 5. History of cholecystectomy. 6. History of appendectomy. 7. History of left knee arthroscopy. 8. Remote history of atrial fibrillation in 1980 that was resolved. 9. Depression and insomnia related to his wifes death approximately one year    ago.  MEDICATIONS ON ADMISSION: 1. Actos 30 mg p.o. q.d. 2. Aspirin 325 mg q.d. 3. Glucophage 1 g b.i.d. 4. Ramipril 5 mg q.d. 5. Lipitor 20 mg q.d. 6. Lopid 600 mg b.i.d. 7. Altace 5 mg q.d. 8. Sublingual nitroglycerin p.r.n.  ALLERGIES:  No known drug allergies.  For family history, social history, review of systems and physical examination please see the history and physical done at the time of admission.  HOSPITAL COURSE:  The patient was admitted and taken to the cardiac operating room on March 25, 2001, where he underwent the following procedure: Coronary artery bypass grafting x 3. The following grafts were placed:  (1) Left radial artery to the right coronary artery, (2) Saphenous vein graft to the obtuse marginal, and (3) Left internal mammary artery to the LAD. Crossclamp time was 61 minutes, pump time 98 minutes.  The patient tolerated the procedure well and was taken to the surgical intensive care unit in stable condition.  POSTOPERATIVE HOSPITAL COURSE:  The patient has done well.  He has remained hemodynamically stable in normal sinus rhythm.  He has responded well to a gentle diuresis.  Laboratory values are stable. He does have a mild postoperative  anemia.  Most recent hemoglobin and hematocrit dated March 27, 2001, are 9.2 and 26.6, respectively. These will be repeated on the date of discharge for follow-up comparison.  His electrolytes, BUN and creatinine have remained stable.  Upon examination, his incisions are healing well.  His left arm is healing well and neurovascularly intact.  He has  shown a good and gradual improvement in toleration of activities using cardiac rehabilitation phase I modalities.  Oxygen has been weaned and he maintained good saturations on room air.  Diabetic control has been good during the postoperative phase.  Overall, he is felt to be tentatively stable for discharge in the morning of March 30, 2001, pending morning round reevaluation.  MEDICATIONS ON DISCHARGE: 1. Aspirin 325 mg daily. 2. Toprol XL 50 mg daily. 3. Actos 45 mg daily. 4. Lipitor 40 mg daily. 5. Lasix 40 mg daily x 7 days. 6. Potassium chloride 20 mEq daily x 7 days. 7. Imdur 30 mg daily for radial artery graft. 8. Glucophage 1 g twice a day. 9. Tylox one or two every for to six hours as needed.  FINAL DIAGNOSES: 1. Severe coronary artery disease as described. 2. Diabetes mellitus. 3. Depression. 4. Nephrolithiasis. 5. Sciatica. 6. Remote atrial fibrillation. 7. Hyperlipidemia. 8. Previous cholecystectomy, appendectomy, and left knee arthroscopy.  DISCHARGE INSTRUCTIONS:  The patient received written instructions in regard to medications, activity, diet, wound care, and follow-up.  FOLLOW-UP:  He will follow up with Dr. Dorris Fetch on Wednesday, April 20, 2001, at 9:30, Dr. Fraser Din on April 12, 2001, with a chest x-ray to be done at that time. Dictated by:   Areta Haber, P.A.C. Attending Physician:  Charlett Lango DD:  03/29/01 TD:  03/29/01 Job: [id number]

## 2010-12-05 NOTE — Op Note (Signed)
[place]. [facility]  Patient:    [name], [name] Visit Number: [id number] MRN: [id number]          Service Type: MED Location: 2000 2033 01 Attending Physician:  Charlett Lango Dictated by:   Salvatore Decent. Dorris Fetch, M.D. Proc. Date: 03/25/01 Admit Date:  03/24/2001   CC:         Pearla Dubonnet, M.D.  Meade Maw, M.D.   Operative Report  PREOPERATIVE DIAGNOSIS:  Three vessel coronary disease with unstable angina.  POSTOPERATIVE DIAGNOSIS:  Three vessel coronary disease with unstable angina.  OPERATION:  Median sternotomy, extracorporeal circulation, coronary artery bypass grafting x 3 ( left internal mammary artery to LAD, left radial artery to distal right coronary, saphenous vein graft to obtuse marginal 2).  SURGEON:  Salvatore Decent. Dorris Fetch, M.D.  ASSISTANT:  Areta Haber, P.A.  ANESTHESIA:  General.  FINDINGS:  Difficult operation secondary to body habitus, good quality conduits, good quality targets, left ventricular hypertrophy and dilatation.  CLINICAL NOTE:  The patient is a 55 year old gentleman with no history of prior coronary disease.  He presents with progressive unstable angina.  He had an episode approximately 2 weeks ago, while at the beach, consistent with severe unstable angina, and possible non Q wave MI.  He sought medical attention at that time.  His first set of enzymes in the emergency room were negative, and he then left the hospital refusing further work-up.  He continued to have waxing and waning symptoms and was having shortness of breath and chest pressure with even minimal exertion.  He had a cardiolite test which was positive and then underwent cardiac catheterization which revealed 3 vessel disease with a 80-90% proximal LAD stenosis, 90% ostial right stenosis, and a total occlusion of his left circumflex before OM-2.  He was referred for coronary artery bypass grafting.  The indications, risks,  benefits and alternative treatments were discussed in detail with the patient.  The operative plan with left mammary artery to LAD, and left radial artery to the right was discussed with the patient.  He understood the additional risks of radial artery harvest as well as the likely benefits.  He understood and accepted the risks of the procedure, and agreed with the operative plan.  DESCRIPTION OF PROCEDURE:  The patient was brought to the preoperative holding area on March 25, 2001, anesthesia placed lines to monitor arterial central venous and pulmonary arterial pressure.  EKG leads were placed for continuous telemetry.  The patient was taken to the operating room, anesthetized and intubated.  A Foley catheter was placed.  Intravenous antibiotics were administered.  The chest, abdomen, legs, and left arm were prepped and draped in the usual fashion.  The patient had a normal Allens test preoperatively with rapid return of blood flow to the entire hand with release of ulnar compression, and a good pulse oxygen wave form on his thumb with radial compression.  An incision was made on the volar aspect of the left wrist over the palpable radial pulse.  The incision was approximately 10 cm in length.  It was carried through the skin and subcutaneous tissue.  Hemostasis was achieved with cautery.  The fascia overlying the radial artery was incised.  A short segment of the radial was mobilized.  Branches were doubly clipped and divided.  A soft bulldog clamp was placed across the radial artery.  There was an excellent pulse distally.  An incision was made to proceed with harvest at the radial  artery which was then performed using standard technique.  The patient was given 5000 units of heparin intravenously.  The distal end of the radial limb was divided.  There was an excellent backbleeding from the stump.  Papaverine was injected into the lumen of the radial artery and the bulldog clamp  was placed across it distally.  The artery was allowed to distend.  The artery then was divided proximally.  Both the proximal distal stumps were suture ligated with 4-0 Prolene sutures.  The wound then was closed in a standard fashion with a subcuticular skin closure.  An incision then was made in the medial aspect of the right leg and the greater saphenous vein was harvested from the ankle to the mid calf.  It was of good quality as was the radial artery.  Again, this wound was closed in a similar fashion.  Next, a median sternotomy was performed.  The left internal mammary artery was harvested in a standard fashion.  This was difficult secondary to the patients body habitus.  The mammary artery was a good quality vessel.  The patient was given the remainder of the full heparin dose prior to dividing the distal end of the mammary artery.  There was excellent flow through the cut end of the vessel.  The mammary was placed in a papaverine soaked sponge and placed into the left pleural space.  The pericardium was opened.  The ascending aorta was inspected and palpated. It was very difficult to reach the apex of the ascending aorta.  At the takeoff of the innominate the aorta was cannulated in that site via 2-0 Ethibond nonpledgetted pursestring sutures.  A dual stage venous cannula was placed via pursestring suture in the right atrial appendage.  Cardiopulmonary bypass was instituted and the patient was cooled to 32 degrees Celsius.  The coronary arteries were inspected and anastomotic sites were chosen.  The conduits were chosen and cut to length.  A foam pad was placed in the pericardium to protect the left phrenic nerve.  A temperature probe was placed in the myocardial septum and a cardioplegia cannula was placed in the ascending aorta.  The aorta was crossclamped.  The left ventricle was emptied via the aortic root vac.  Cardiac arrest was then achieved with a combination of  cold  antegrade blood cardioplegia and topical iced saline.  After achieving a complete diastolic arrest and a myocardial septal temperature of 12 degrees Celsius the following distal anastomoses were performed.  First a reverse saphenous vein graft was placed end-to-side to the second obtuse marginal branch and the left circumflex coronary artery.  The left circumflex was totalled prior to the takeoff of the OM-2.  The OM-2 itself was a good quality vessel.  It was 1.5 mm in diameter.  The vein was of good quality.  The anastomosis was performed with a running 7-0 Prolene suture in an end-to-side fashion.  There was good flow to the graft.  Cardioplegia was administered and there was good hemostasis.  Next, the distal end of the left radial artery was spatulated.  It was anastomosed end-to-side to the distal right coronary artery.  The arteriotomy on the distal right was performed and then carried on to the proximal aspect of the posterior descending.  There was some minimal atherosclerotic plaque laterally in the distal right coronary and none in the posterior descending. The end-to-side anastomosis was performed with a running 8-0 Prolene suture. The graft was gently flushed.  There was excellent flow.  Cardioplegia was administered down the aortic root.  There was good back bleeding.  The soft bulldog clamp then was placed on the radial proximally.  The left internal mammary artery then was brought through a window in the pericardium.  The distal end was spatulated.  It was a 2.5 mm good quality conduit.  The LAD was a 2.0 mm good quality target.  The anastomosis was performed to the mid LAD in an end-to-side fashion using a running 8-0 Prolene suture.  At the completion of this anastomosis the bulldog clamp was briefly removed from the mammary artery to inspect for hemostasis.  It then was replaced. There was good hemostasis.  The mammary pedicle was tacked to the  epicardial surface of the heart with 6-0 Prolene sutures.  Additional cardioplegia then was administered via the vein graft in the aortic root.  The grafts were cut to length.  The cardioplegia cannula was removed.  The proximal radial and vein graft anastomosis were performed to 4.0 mm punch aortotomies.  The radial anastomosis were performed first with a running 7-0 Prolene suture. The vein graft anastomosis then was performed with a running 6-0 Prolene suture.   At the completion of the vein graft anastomosis before tying the suture the patient was placed in Trendelenburg position.  The bulldog clamp was removed from the mammary artery.  Immediate and rapid septal rewarming was once again noted.  Lidocaine was administered.  Deairing maneuvers were performed.  The aortic crossclamp was removed. The total crossclamp time was 61 minutes.  The patient initially fibrillated and required a single defibrillation with 20 joules and then remained in sinus rhythm.  All proximal and distal anastomosis were inspected for hemostasis.  The epicardial pacing wires were placed on the right ventricle and the right atrium.  Rewarming was begun during the mammary to LAD anastomosis and when the core temperature reached 37 degrees Celsius the patient was weaned from cardiopulmonary bypass.  He was on no inotropic support at the time of separation from bypass.  The total bypass time was 98 minutes.  His initial cardiac index was greater than 3 liters per minute per meter squared and he remained hemodynamically stable throughout the post bypass period.  A low dose dopamine drip was subsequently started secondary to relatively low urine output while on bypass.  A test dose of protamine was administered and was well tolerated.  The atrial and aortic cannulae were removed.  The remainder of the protamine was administered without incident.  The chest was irrigated with 1 liter of warm normal saline  containing 1 gram of vancomycin.  Hemostasis was achieved.  A left pleural and 2 mediastinal chest tubes were placed through separate subcostal incisions.  The pericardium itself could not be reapproximated.  The sternum was closed with heavy gauge interrupted stainless steel wires.  The pectoralis fascia was closed with a running #1 Vicryl suture.  The subcutaneous tissue was closed with a running 2-0 Vicryl suture and the skin was closed with a subcuticular suture.  All sponge, needle and instrument counts were correct at the end of the procedure.  The patient remained hemodynamically stable and was taken from the operating room to the surgical intensive care unit.   y Dictated by:   Salvatore Decent. Dorris Fetch, M.D. Attending Physician:  Charlett Lango DD:  03/25/01 TD:  03/27/01 Job: [id number]

## 2010-12-05 NOTE — Consult Note (Signed)
[place]. [facility]  Patient:    [name], [name] Visit Number: [id number] MRN: [id number]          Service Type: Attending:  Salvatore Decent. Dorris Fetch, M.D. Dictated by:   Salvatore Decent Dorris Fetch, M.D. Proc. Date: 03/24/01 Adm. Date:  03/24/01   CC:         Pearla Dubonnet, M.D.  Meade Maw, M.D.   Consultation Report  REASON FOR CONSULTATION:  Three vessel coronary disease and unstable angina.  CHIEF COMPLAINT:  Chest pain and shortness of breath with minimal exertion.  HISTORY OF PRESENT ILLNESS:  The patient is a 55 year old gentleman with no prior history of coronary disease.  He does have a history of type 2 diabetes, hypertension and hypercholesterolemia.  He had noted over the past several months decreased exercise tolerance and easy fatigue.  Over the past month this has become quite severe.  He was at the beach 2 weeks ago for a golf tournament when he developed minimal exertion, an episode of severe chest pressure described as an elephant standing on my chest with radiation to his left arm with severe diaphoresis and shortness of breath.  He went to the emergency room there, and was found to have negative enzymes initially.  An EKG was done but the results were not available.  He was advised to stay for further testing to elected to leave.  He subsequently went and played golf without difficulty, although he did feel very tired throughout the entire weekend.  He saw Dr. Kevan Ny on March 15, 2001, and was scheduled for an adenosine cardiolite study which was positive.  The cardiolite showed an ejection fraction of 50%, with decreased uptake in the anterior wall and mid base approaching the apex and decreased uptake in the posterior wall as well.  He subsequently was scheduled for cardiac catheterization which was performed today and revealed 3 vessel disease.  He states that currently he has almost 0 exercise tolerance and if he walks  approximately 50 feet he becomes shortness of breath, and has to stop and rest.  He has not had any nocturnal symptoms. He has not had any syncope or presyncope and no palpitations.  PAST MEDICAL HISTORY:  Significant for: 1. Type 2 diabetes mellitus. 2. Dyslipidemia with severe hypertriglyceridemia with initial triglyceride    level of more than 4000. 3. Proteinuria resolved. 4. Nephrolithiasis recently treated. 5. A history of cholecystectomy. 6. History of appendectomy. 7. History of left knee arthroscopy. 8. History of atrial fibrillation in 1980 resolved. 9. Depression and insomnia related to his wifes death approximately 1 year    ago.  MEDICATIONS:  At admission: 1. Actos 30 mg p.o. q.d. 2. Aspirin 325 mg p.o. q.d. 3. Glucophage 1000 mg p.o. b.i.d. 4. Ramipril 5 mg p.o. q.d. 5. Lipitor 20 mg p.o. q.d. 6. Lopid 600 mg p.o. b.i.d. 7. Altace 5 mg p.o. q.d. 8. Sublingual nitroglycerin p.r.n.  ALLERGIES:  He had no known drug allergies.  FAMILY HISTORY:  Positive for cancer.  No cardiac disease.  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  He is widowed approximately 1 year ago.  He rides motorcycles frequently.  He does not use alcohol or tobacco.  REVIEW OF SYSTEMS:  Easy fatigue, even with minimal exertion.  He has been depressed over the past year secondary to the loss of his wife.  He has not had any stroke or TIA symptoms.  No seizures.  No orthopnea,  paroxysmal nocturnal dyspnea, or peripheral edema, no claudication, DVT, abnormal bleeding or clotting.  No recent change in bowel or bladder habits.  All other systems are negative.  PHYSICAL EXAMINATION:  GENERAL:  The patient is a 55 year old white male in no acute distress.  Blood pressure is 119/79, pulse is 81 and regular.  Respirations are 16.  Oxygen saturations is 99% on room air.  NEUROLOGIC:  He is alert and oriented x 3, grossly intact.  HEENT:   Unremarkable.  NECK:  Supple without thyromegaly, adenopathy or bruits.  LUNGS:  Clear to auscultation and percussion.  CARDIAC:  Regular rate and rhythm, normal S1, S2.  No rubs, murmurs or gallops.  ABDOMEN:  Obese, soft, and nontender.  EXTREMITIES:  Recent arthroscopy with well-healed scars on his left knee.  He has 1+ dorsalis pedis pulses bilaterally.  There is no edema and no varicosities.  SKIN:  Intact, warm and dry.  EXTREMITIES:  His left upper extremity has 2+ radial and ulnar pulses.  He has a normal Allens test with rapid reperfusion of the hand despite radial compression and pulse oxygen shows good wave form with radial compression.  LABORATORY DATA:  Hemoglobin 13.1, hematocrit 36, white count 8.2, platelets 341, PT is 10.9, PTT 30, glucose is 152, potassium 4.8, BUN and creatinine 13 and 0.9.  His EKG shows nonspecific T wave abnormalities and normal sinus rhythm.  His carotid Dopplers have shown no plaque bilaterally.  He has a normal Allens test bilaterally.  ABIs are greater than 1 bilaterally. Cardiac catheterization reveals a 90% proximal LAD lesion, a total occlusion of his mid and left circumflex, before an OM-2 which you can see faintly filling via collaterals and a 90% ostial right coronary stenosis.  IMPRESSION:  The patient is a 55 year old gentleman with multiple risk factors including non-insulin dependent diabetes mellitus, hypertension, hypercholesterolemia who presents with unstable angina, progressive angina with minimal exertion, and by catheterization has severe 3 vessel coronary disease with mildly impaired left ventricular function.  It is very likely that he had a non Q wave MI approximately 2 weeks ago.  He has continued to have pain since that time.  Given his 3 vessel disease and diabetes, the optimal treatment is coronary  artery bypass grafting for survival benefit, relief of symptoms and quality of life.  I have discussed with  the patient the indications, risks, benefits and alternative treatments.  He understands that coronary artery bypass grafting is the treatment most likely to provide survival benefit and good quality of life.  He understands that the procedure does have risks, and that the risks include but are not limited to death, stroke, MI, bleeding, possible need for blood transfusions, infections, DVT, PE and other organ system dysfunction including respiratory, renal, hepatic and GI.  He understands that the expected length of stay as well as postoperative recuperation and expected return to work.  Given his young age, a radial artery graft may provide increased interval before re-do procedures and a longer graft longevity and a possible avoidance of future re-do procedures.  We will plan to do a left radial artery graft to his right coronary saphenous vein to OM-2 and a left internal mammary artery to LAD.  He understands that there is a risk of hand ischemia or nerve damage with subsequent numbness in the thumb with radial artery harvest.  The patient understands and accepts all issues that were discussed.  He had an opportunity to ask questions.  He has done so, and  he agrees to proceed.  We will plan for surgery first case in the morning. Dictated by:   Salvatore Decent Dorris Fetch, M.D. Attending:  Salvatore Decent. Dorris Fetch, M.D. DD:  03/24/01 TD:  03/25/01 Job: [id number]

## 2010-12-05 NOTE — Op Note (Signed)
[facility]  Patient:    [name], [name]                          MRN: [id number] Proc. Date: 12/02/99 Adm. Date:  [id number] Disc. Date: [id number] Attending:  Londell Moh CC:         Jamison Neighbor, M.D.                           Operative Report  PREOPERATIVE DIAGNOSIS:  Left renal calculus.  POSTOPERATIVE DIAGNOSIS:  Left renal calculus.  OPERATION PERFORMED:  Cystoscopy, bilateral retrograde and left double-J catheter insertion.  SURGEON:  Jamison Neighbor, M.D.  ANESTHESIA:  General.  COMPLICATIONS:  None.  DRAINS:  None.  INDICATIONS FOR PROCEDURE:  This 55 year old male developed left-sided flank pain and CT scan showed a large stone 6 x 13 in the left renal pelvis. Patient is to undergo stent placement in preparation for ESWL later this week. He understands the risks and benefits of the procedure and gave full informed consent.  DESCRIPTION OF PROCEDURE:  After successful induction of general anesthesia, the patient was placed in dorsal lithotomy position and prepped with Betadine and draped in the usual sterile fashion.  Cystoscopy was performed and the bladder was carefully inspected and was free of any tumor or stones.  Both ureteral orifices were normal in configuration and location.  Retrogrades ____________ on the left hand side showed a normal ureter and what did appear to be a stone in the left renal pelvis.  It was quite blurry, however, due some problems with the fluoroscopy unit.  The wire was passed up into the kidney where it coiled normally.  A 6 French x 22 cm double-J catheter was passed over the wire and allowed to coil normally within the renal pelvis and within the bladder.  The retrograde study done on the opposite side showed a completely normal renal pelvis with no sign of any filling defect and drain out.  The bladder was drained.  The patient tolerated the procedure well and was taken to recovery room in good  condition. DD:  12/02/99 TD:  12/05/99 Job: [id number]

## 2010-12-05 NOTE — H&P (Signed)
. [facility]  Patient:    [name], [name] Visit Number: [id number] MRN: [id number]          Service Type: Attending:  Meade Maw, M.D. Dictated by:   Meade Maw, M.D.   CC:         Pearla Dubonnet, M.D.   History and Physical  REFERRING PHYSICIAN:  Pearla Dubonnet, M.D.  INDICATION FOR ADMISSION:  Unstable angina.  HISTORY:  [name] is a 55 year old gentleman who has actually noted onset of chest pain and decrease in exercise tolerance for the past several months.  In the recent past three weeks, however, the chest pain and shortness of breath has become more progressively worse.  He is now unable to perform his job without resting frequently.  This past week, while at the beach, he noted an onset of chest tightness associated with left arm pain and shortness of breath.  He presented to the local emergency room for evaluation but left prior to the completion of that evaluation.  He subsequently was transferred to me for further stress testing.  On a full Bruce stress Cardiolite at four minutes into the exertion he was noted to develop ECG changes and his typical chest tightness.  Cardiolite images had revealed decreased uptake in the anterior, anterolateral, as well as the inferior apical wall.  He was subsequently referred for cardiac catheterization.  Of note, he underwent an adenosine Cardiolite in 1998 which revealed no evidence of inadequate coronary flow reserve.  Review of systems reveals he has had decreased exercise tolerance for several months.  He now notes diaphoresis and chest tightness with minimal exertion.  CORONARY RISK FACTORS:  Male sex, diabetes mellitus, dislipidemia.  PAST MEDICAL HISTORY: 1. Severe hypertriglyceridemia with a triglyceride level of more than 4000    prior to treatment. 2. Adult onset diabetes mellitus. 3. Proteinuria. 4. Depression and insomnia related to the recent loss of his wife. 5.  Nephrolithiasis. 6. Sciatica of the right lower extremity. 7. Atrial fibrillation in 1980.  CURRENT MEDICATIONS: 1. Aspirin daily. 2. Actos 30 mg p.o. q.d. 3. Glucophage 1 gm b.i.d. 4. Lipitor 20 mg p.o. q.d. 5. Lopid 600 mg p.o. b.i.d. 6. Altace 5 mg p.o. q.d.  ALLERGIES:  No known drug allergies.  PAST SURGICAL HISTORY: 1. Extraction of kidney stone. 2. Appendectomy in December 1977. 3. Cholecystectomy in 1997.  FAMILY HISTORY:  Significant for breast cancer in his mother.  Father passed at age 40 with a meningioma.  SOCIAL HISTORY:  He is a nonsmoker, nondrinker.  Recent loss of his wife from brain cancer.  He has two daughters.  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  REVIEW OF SYSTEMS:  Is as noted above.  He has had no history of tachycardia, presyncope, syncope.  He has been compliant with his medical regimen.  LABORATORY DATA:  Coronary angiography was performed and revealed three-vessel disease involving the proximal LAD, total occlusion of the mid circumflex, and ostial occlusion of the right.  IMPRESSION: 1. Unstable angina with severe three-vessel disease and mild left ventricular    dysfunction.  He will be admitted.  Cardiovascular surgery consultation    will  be obtained.  He will be continued on aspirin daily.  Should he    develop ongoing chest pain, consideration to initiation of heparin. 2. Diabetes mellitus.  We will hold his Glucophage, continue with Altace and a    sliding scale insulin. 3. Dislipidemia.  Will continue  with Lipitor and Lopid as previously    prescribed. 4. Diabetic proteinuria.  Continue with Altace 5 mg p.o. q.d. Dictated by: Meade Maw, M.D. Attending:  Meade Maw, M.D. DD:  03/24/01 TD:  03/24/01 Job: [id number]

## 2010-12-05 NOTE — Op Note (Signed)
[facility]  Patient:    [name], [name]                          MRN: [id number] Proc. Date: 01/02/00 Adm. Date:  [id number] Attending:  Londell Moh CC:         Pearla Dubonnet, M.D.             Jamison Neighbor, M.D.                           Operative Report  SERVICE:  Urology.  PREOPERATIVE DIAGNOSIS:  Retained renal and ureteral fragments status post ESWL.  POSTOPERATIVE DIAGNOSIS:  Retained renal and ureteral fragments status post ESWL.  PROCEDURE:  Percutaneous nephrolithotripsy and intraoperative flexible ureteroscopy as well as flexible pyeloscopy.  SURGEON:  Jamison Neighbor, M.D.  RADIOLOGIST:  Arnell Sieving, M.D.  ANESTHESIA:  General.  COMPLICATIONS:  None.  DRAIN:  A 20 French Councill-tip Foley catheter as a nephrostomy tube.  HISTORY OF PRESENT ILLNESS:  This patient was known to have a 1.5 cm stone in the left renal pelvis. This was easily visualized on CT but poorly visualized on plane film and it was felt to possibly be due to uric acid. The patient had a double J catheter placed in preparation for ESWL. ESWl was done in order to fully visualize the stone. The patient needed to receive intravenous contrast. The stone did appear to fragment and the patient passed a large number of pieces but became obstructed after his double J catheter was removed. The patient had significant discomfort with the double J in place and refused to have this inserted and for that reason had a percutaneous nephrostomy tube placed on an emergency basis to alleviate his severe stone pain. The antegrade nephrotomogram done at that time showed residual stone fragments as well as fragments within the ureter. The patient has been placed on allopurinol and Polycitra to decrease the stone burden, and he is now scheduled to undergo percutaneous nephrolithotripsy and flexible ureteroscopy done through the nephrostomy tube in order to determine  if there is residual stone material. He understands the risks and benefits of the procedure including the possibility that he might require stent and he gave and full and informed consent.  DESCRIPTION OF PROCEDURE:  After successful induction of general anesthesia, the patient was placed on the operating room table in the prone position. His previously placed nephrostomy tube was disconnected from the drainage bag, the entire area was then prepped and draped. The nephrostomy tube was cut and Dr. Michail Jewels obtained access by passing a guidewire down through the nephrostomy tube and into the collecting system and then down the ureter. A combi catheter was then passed and a nephrostomy tube study through that showed good flow through the distal ureter. The patient had a second safety wire obtained. The track master assist was then used to dilate the percutaneous track and access was obtained. The nephroscope was then inserted. A few small clots were irrigated out of the system. Careful inspection showed a few tiny small stone fragments which were sucked out but most of the stone material appears to have cleared having dissolved in the Polycitra and allopurinol. The flexible ureteroscope was then used to carefully inspect caliceal system and nothing but tiny small dust fragments were seen and there were no large chunks of stone. The ureteroscope was then passed down along  the ureter and it did help to flush out stone fragments from the distal ureter and these should easily be voided. The scope was advanced all the way down into the bladder and then the ureter was visualized as it was withdrawn and there was no obstruction. The ureter itself appeared unremarkable with no mucosal abnormalities or any significant trauma. The sheath was removed and a Councill-tip catheter was then passed over the safety wire and allowed to enter the upper pole. The balloon was filled and sat nicely in the pelvis. This  tube was secured with a 3-0 silk. The patient tolerated the procedure well and was taken to the recovery room in good condition. He will have a nephrostomy tube study performed tomorrow, if that looks good that can be clamped and removed either tomorrow or after discharge. The patient should be able to return to work sometime next week. He will be sent home with a prescription for Lorcet plus to take as needed for pain. He will be covered with Levaquin postoperatively but during this hospital stay will be on Ancef and tobramycin. He will continue on the allopurinol and Polycitra for now. DD:  01/02/00 TD:  01/06/00 Job: [id number]

## 2010-12-05 NOTE — Cardiovascular Report (Signed)
[name], [name]                   ACCOUNT NO.:  [id number]   MEDICAL RECORD NO.:  [id number]          PATIENT TYPE:  OIB   LOCATION:  2899                         FACILITY:  [facility]   PHYSICIAN:  Meade Maw, M.D.    DATE OF BIRTH:  [birth year]   DATE OF PROCEDURE:  DATE OF DISCHARGE:                              CARDIAC CATHETERIZATION   REFERRING PHYSICIAN:  Candyce Churn, M.D.   INDICATIONS FOR PROCEDURE:  Unstable angina.   PROCEDURE:  After obtaining written informed consent. the patient was  brought to the cardiac catheterization lab in a post absorptive state.  Preoperative sedation was achieved using IV Versed. The right groin was  prepped and draped in the usual sterile fashion. Local anesthesia was  achieved using 1% Xylocaine. A 6-French hemostasis sheath was placed into  the right femoral artery using a modified Seldinger technique. Selective  coronary angiography was performed using JL-4, JR-4 Judkins catheter. The  left internal mammary artery was engaged using a JR-4.  Saphenous vein graft  to the first obtuse marginal was engaged using a JR-4. The free radial graft  to the right coronary artery was a difficult engagement, more than 45  minutes was attempted at engagement. Dr. Tresa Endo was consulted for additional  assistance.  Final engagement of the free radial graft was achieved using an  AR-1. There were no immediate complications. The patient was transferred to  the holding area. The hemostasis sheath was removed. The film will be  reviewed by Dr. Corliss Marcus for possible intervention on the distal LAD.   FINDINGS:  The aortic pressure is 139/88, LV pressure was 140/11. Single-  plane ventriculogram reveals normal wall motion, ejection fraction of 65%.  There was no mitral regurgitation noted.  Coronary angiography: The left  main coronary artery bifurcates into the left anterior descending and  circumflex vessel. There is no disease noted in the left main  coronary  artery. Left anterior descending: Left anterior descending has a proximal 70-  80% lesion, gives rise to the D1. The left anterior descending is then 100%  occluded. The left internal mammary artery to the left anterior descending  is patent. There is a distal 70-80% lesion prior to the apex noted.  Circumflex vessels: The circumflex vessel has a 60% proximal occlusion,  gives rise to a moderate first obtuse marginal, then is totally occluded.  The saphenous vein graft to the obtuse marginal is ostially occluded. The  right coronary artery: The right coronary artery is a 100% proximally  occluded. The free radial graft to the right coronary artery remains patent.   FINAL IMPRESSION:  1.  Occluded saphenous vein graft to the obtuse marginal.  2.  Critical distal left anterior descending disease distal to the      anastomotic site.  3.  Patent free radial graft.  4.  Normal wall motion, ejection fraction 65%.  5.  Dr. Amil Amen will be consulted for further intervention.      HP/MEDQ  D:  01/15/2005  T:  01/15/2005  Job:  [id number]

## 2010-12-05 NOTE — Op Note (Signed)
NAME:  [name], [name]                             ACCOUNT NO.:  [id number]   MEDICAL RECORD NO.:  [id number]                   PATIENT TYPE:  AMB   LOCATION:  [facility]                                 FACILITY:  [facility]   PHYSICIAN:  Jamison Neighbor, M.D.               DATE OF BIRTH:  11-May-1956   DATE OF PROCEDURE:  02/12/2004  DATE OF DISCHARGE:                                 OPERATIVE REPORT   PREOPERATIVE DIAGNOSIS:  Left renal calculus.   POSTOPERATIVE DIAGNOSIS:  Left renal calculus.   PROCEDURE:  Cystoscopy, left retrograde with interpretation.  Left double J  catheter insertion.   SURGEON:  Jamison Neighbor, M.D.   ANESTHESIA:  General anesthesia.   COMPLICATIONS:  None.   DRAINS:  [name] 6 French x 26 cm double J catheter.   BRIEF HISTORY:  This 55 year old male has [name] past history of stones.  He  required percutaneous nephrolithotripsy at one point.  He had been stone-  free for several years but now has [name] large stone in the left kidney.  This  is amendable to ESWL but because of the large __________ it was felt [name] stent  should be placed in preparation for the procedure.  The patient understands  the risks and benefits of the procedure and gave full informed consent.   DESCRIPTION OF PROCEDURE:  After successful induction of general anesthesia,  the patient was placed in the dorsal lithotomy position and prepped with  Betadine and draped in the usual sterile fashion.  Cystoscopy was performed.  The urethra was visualized in it entirety and found to be normal.  Beyond  the verumontanum the prostate was not particularly enlarged.  Bladder was  free of trabeculation, stones or other abnormalities.  Right ureteral  orifice was normal.  The left ureteral orifice was unremarkable.  [name]  retrograde study performed on that side showed somewhat dilated ureter  secondary to previous surgical intervention.  The patient was found to have  [name] stone impacted at the left UPJ and this was [name] large  stone and will be  difficult to pass.  For that reason [name] wire was passed beyond the stone and [name]  6 [place] x26 cm double J catheter was inserted.  The patient tolerated the  procedure well and was taken to the recovery room in good condition and will  be given several days' worth of Septra.  He will have ESWL in two days and  he does have pain medication.                                               Jamison Neighbor, M.D.    RJE/MEDQ  D:  02/12/2004  T:  02/12/2004  Job:  [id number]

## 2010-12-25 ENCOUNTER — Encounter (INDEPENDENT_AMBULATORY_CARE_PROVIDER_SITE_OTHER): Payer: BC Managed Care – PPO | Admitting: Vascular Surgery

## 2010-12-25 DIAGNOSIS — I70219 Atherosclerosis of native arteries of extremities with intermittent claudication, unspecified extremity: Secondary | ICD-10-CM

## 2010-12-26 NOTE — Assessment & Plan Note (Signed)
OFFICE VISIT  [name], [name] [name] DOB:  Nov 22, 1955                                       12/25/2010 BJYNW#:[id number]  CHIEF COMPLAINT:  Left leg pain.  HISTORY OF PRESENT ILLNESS:  The patient is [name] 55 year old male referred by Dr. Nanetta [name] for evaluation of left leg pain.  The patient stated that he began to develop pain in the left leg from the knee down with numbness and tingling since May 2012.  He has had rest pain for several weeks.  He was started on Vicodin yesterday by Dr. Allyson [name] but really has had no relief of his symptoms.  His right leg is asymptomatic.  The patient has extensive vascular history. Previously in January 2011, he had [name] stent placed in his left leg which by review of the arteriogram appears to be in the popliteal artery.  This subsequently occluded in April 2011 and the stenting procedure was redone.  After this, he re-occluded in November 2011 and he was restudied and recanalized.  He subsequently occluded again 1 week after this.  He has been managed conservatively since that time.  Of note, he previously had right saphenous vein harvested from the lower leg for coronary bypass grafting.  He also had [name] left radial artery harvesting for coronary bypass grafting.  He apparently had [name] recent stress test which was negative per the patient.  He is currently on Plavix and aspirin for his leg.  Chronic medical problems include diabetes, elevated cholesterol, and coronary artery disease.  These are all currently stable and followed by Dr. Kevan [name] and Dr. Allyson [name].  PAST SURGICAL HISTORY:  Coronary artery bypass grafting, cholecystectomy, appendectomy, 2 knee operations, shoulder operation, kidney stones, carpal tunnel release and the above-mentioned stenting procedures.  SOCIAL HISTORY:  He is widowed and he has 2 children.  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  FAMILY  HISTORY:  Not remarkable for early vascular disease or diabetes.  REVIEW OF SYSTEMS:  He is 5 feet 11 and 240 pounds.  All other systems are negative.  MEDICATIONS:  Aspirin 325 mg once [name] day, Plavix 75 mg once [name] day, glyburide/metformin 2.5/500 twice [name] day, metoprolol 25 mg once [name] day, simvastatin 80 mg once [name] day, TriCor 45 mg once [name] day, lisinopril hydrochlorothiazide 12.5 once [name] day, gabapentin 300 mg 3 times [name] day, Lantus insulin 75 units once [name] day.  ALLERGIES:  He has no known drug allergies.  PHYSICAL EXAMINATION:  Blood pressure 170/95 in the left arm, heart rate 79 and regular, respirations 20. HEENT:  Unremarkable. NECK:  Has 2+ carotid pulses without bruits. CHEST:  Clear to auscultation. CARDIAC:  Regular rate and rhythm without murmur. ABDOMEN:  Slightly obese, soft, nontender, nondistended.  No masses. MUSCULOSKELETAL:  Exam shows no obvious major joint deformities. NEUROLOGIC:  He has symmetric upper extremity and lower extremity motor strength.  He does have some decreased sensation to light touch in the left foot compared to the right. SKINS:  No open ulcers or rashes. VASCULAR:  He has 2+ femoral pulses bilaterally.  He had [name] left leg ABI performed at Dr. Hazle Coca office on June 21 which shows an ABI on the left of 0.37.  This is similar to May 2012.  In summary, the patient has  developed severe and progressive peripheral arterial disease and now has ABIs which would put him at risk of limb loss.  He currently has no ulcerations on the feet but certainly has [name] component of rest pain.  I believe the best option for him at this point would be to repeat his arteriogram for preoperative planning purposes. He will most likely need [name] tibial bypass based on his previous angiographic findings but potentially we could maybe do [name] bypass to the popliteal artery if this has been preserved after the recent stent occlusions.  I discussed all the risks, benefits, possible  complications and procedure details of the arteriogram as well as bypass grafting as well as patency rate of long-term bypass grafting as well as risk of long-term limb loss risk.  The patient understands and agrees to proceed.  His arteriogram is scheduled for December 31, 2010 by Dr. Arlys [name]. I have placed on [name] scheduled for [name] bypass operation on January 07, 2011 pending the results of the arteriogram.  I also discussed with him that if Dr. Myra [name] has room on the OR schedule and consents to this, he could potentially do his bypass before this since I am out of town next week.    [name] Hora. [name], MD Electronically Signed  CEF/MEDQ  D:  12/26/2010  T:  12/26/2010  Job:  [id number]  cc:   [name] [name], M.D.

## 2010-12-31 ENCOUNTER — Ambulatory Visit (HOSPITAL_COMMUNITY)
Admission: RE | Admit: 2010-12-31 | Discharge: 2010-12-31 | Disposition: A | Discharge status: Home / Self Care | Payer: BC Managed Care – PPO | Source: Ambulatory Visit | Attending: Surgery | Admitting: Surgery

## 2010-12-31 DIAGNOSIS — E119 Type 2 diabetes mellitus without complications: Secondary | ICD-10-CM | POA: Insufficient documentation

## 2010-12-31 DIAGNOSIS — I70219 Atherosclerosis of native arteries of extremities with intermittent claudication, unspecified extremity: Secondary | ICD-10-CM

## 2010-12-31 DIAGNOSIS — Z951 Presence of aortocoronary bypass graft: Secondary | ICD-10-CM | POA: Insufficient documentation

## 2010-12-31 DIAGNOSIS — E7800 Pure hypercholesterolemia, unspecified: Secondary | ICD-10-CM | POA: Insufficient documentation

## 2010-12-31 DIAGNOSIS — Z7902 Long term (current) use of antithrombotics/antiplatelets: Secondary | ICD-10-CM | POA: Insufficient documentation

## 2010-12-31 DIAGNOSIS — Z7982 Long term (current) use of aspirin: Secondary | ICD-10-CM | POA: Insufficient documentation

## 2010-12-31 DIAGNOSIS — Z794 Long term (current) use of insulin: Secondary | ICD-10-CM | POA: Insufficient documentation

## 2010-12-31 DIAGNOSIS — Z79899 Other long term (current) drug therapy: Secondary | ICD-10-CM | POA: Insufficient documentation

## 2010-12-31 DIAGNOSIS — I2510 Atherosclerotic heart disease of native coronary artery without angina pectoris: Secondary | ICD-10-CM | POA: Insufficient documentation

## 2010-12-31 LAB — POCT I-STAT, CHEM 8
BUN: 20 mg/dL (ref 6–23)
Calcium, Ion: 1.23 mmol/L (ref 1.12–1.32)
Chloride: 107 mEq/L (ref 96–112)
HCT: 42 % (ref 39.0–52.0)
Potassium: 4 mEq/L (ref 3.5–5.1)
Sodium: 139 mEq/L (ref 135–145)

## 2010-12-31 LAB — GLUCOSE, CAPILLARY: Glucose-Capillary: 193 mg/dL — ABNORMAL HIGH (ref 70–99)

## 2011-01-05 ENCOUNTER — Encounter (HOSPITAL_COMMUNITY)
Admit: 2011-01-05 | Discharge: 2011-01-05 | Disposition: A | Discharge status: Home / Self Care | Payer: BC Managed Care – PPO | Attending: Vascular Surgery | Admitting: Vascular Surgery

## 2011-01-05 ENCOUNTER — Encounter: Payer: Self-pay | Admitting: Surgery

## 2011-01-05 LAB — URINALYSIS, ROUTINE W REFLEX MICROSCOPIC
Glucose, UA: >1000 mg/dL — AB
Leukocytes, UA: NEGATIVE
Protein, ur: NEGATIVE mg/dL
Specific Gravity, Urine: 1.033 — ABNORMAL HIGH (ref 1.005–1.030)
Urobilinogen, UA: 0.2 mg/dL (ref 0.0–1.0)

## 2011-01-05 LAB — CBC
Hemoglobin: 14.3 g/dL (ref 13.0–17.0)
MCHC: 35 g/dL (ref 30.0–36.0)
WBC: 9.7 10*3/uL (ref 4.0–10.5)

## 2011-01-05 LAB — COMPREHENSIVE METABOLIC PANEL
BUN: 13 mg/dL (ref 6–23)
Calcium: 9.6 mg/dL (ref 8.4–10.5)
GFR calc Af Amer: >60 mL/min (ref >60)
Glucose, Bld: 269 mg/dL — ABNORMAL HIGH (ref 70–99)
Sodium: 136 mEq/L (ref 135–145)
Total Protein: 6.9 g/dL (ref 6.0–8.3)

## 2011-01-05 LAB — URINE MICROSCOPIC-ADD ON

## 2011-01-05 LAB — APTT: aPTT: 29 seconds (ref 24–37)

## 2011-01-05 LAB — PROTIME-INR: INR: 0.88 (ref 0.00–1.49)

## 2011-01-07 ENCOUNTER — Inpatient Hospital Stay (HOSPITAL_COMMUNITY): Payer: BC Managed Care – PPO

## 2011-01-07 ENCOUNTER — Ambulatory Visit (HOSPITAL_COMMUNITY)
Admission: RE | Admit: 2011-01-07 | Discharge: 2011-01-07 | Disposition: A | Discharge status: Home / Self Care | Payer: BC Managed Care – PPO | Source: Ambulatory Visit | Attending: Vascular Surgery | Admitting: Vascular Surgery

## 2011-01-07 ENCOUNTER — Ambulatory Visit (HOSPITAL_COMMUNITY): Admission: RE | Admit: 2011-01-07 | Payer: BC Managed Care – PPO | Source: Ambulatory Visit | Admitting: Surgery

## 2011-01-07 ENCOUNTER — Other Ambulatory Visit: Payer: Self-pay | Admitting: Surgery

## 2011-01-07 ENCOUNTER — Inpatient Hospital Stay (HOSPITAL_COMMUNITY)
Admission: RE | Admit: 2011-01-07 | Discharge: 2011-01-12 | DRG: 797 | Disposition: A | Discharge status: Home / Self Care | Payer: BC Managed Care – PPO | Source: Ambulatory Visit | Attending: Vascular Surgery | Admitting: Vascular Surgery

## 2011-01-07 DIAGNOSIS — I739 Peripheral vascular disease, unspecified: Principal | ICD-10-CM | POA: Diagnosis present

## 2011-01-07 DIAGNOSIS — E7800 Pure hypercholesterolemia, unspecified: Secondary | ICD-10-CM | POA: Diagnosis present

## 2011-01-07 DIAGNOSIS — Z951 Presence of aortocoronary bypass graft: Secondary | ICD-10-CM

## 2011-01-07 DIAGNOSIS — I70219 Atherosclerosis of native arteries of extremities with intermittent claudication, unspecified extremity: Secondary | ICD-10-CM

## 2011-01-07 DIAGNOSIS — I10 Essential (primary) hypertension: Secondary | ICD-10-CM | POA: Diagnosis present

## 2011-01-07 DIAGNOSIS — Z01812 Encounter for preprocedural laboratory examination: Secondary | ICD-10-CM

## 2011-01-07 DIAGNOSIS — I2510 Atherosclerotic heart disease of native coronary artery without angina pectoris: Secondary | ICD-10-CM | POA: Diagnosis present

## 2011-01-07 DIAGNOSIS — M1990 Unspecified osteoarthritis, unspecified site: Secondary | ICD-10-CM | POA: Diagnosis present

## 2011-01-07 DIAGNOSIS — Z7982 Long term (current) use of aspirin: Secondary | ICD-10-CM

## 2011-01-07 DIAGNOSIS — Z79899 Other long term (current) drug therapy: Secondary | ICD-10-CM

## 2011-01-07 DIAGNOSIS — I252 Old myocardial infarction: Secondary | ICD-10-CM

## 2011-01-07 DIAGNOSIS — E119 Type 2 diabetes mellitus without complications: Secondary | ICD-10-CM | POA: Diagnosis present

## 2011-01-07 HISTORY — PX: FEMORAL-TIBIAL BYPASS GRAFT: SHX938

## 2011-01-07 LAB — GLUCOSE, CAPILLARY
Glucose-Capillary: 264 mg/dL — ABNORMAL HIGH (ref 70–99)
Glucose-Capillary: 207 mg/dL — ABNORMAL HIGH (ref 70–99)
Glucose-Capillary: 163 mg/dL — ABNORMAL HIGH (ref 70–99)
Glucose-Capillary: 183 mg/dL — ABNORMAL HIGH (ref 70–99)

## 2011-01-08 LAB — CBC
Platelets: 219 10*3/uL (ref 150–400)
RDW: 14.4 % (ref 11.5–15.5)
WBC: 13 10*3/uL — ABNORMAL HIGH (ref 4.0–10.5)

## 2011-01-08 LAB — BASIC METABOLIC PANEL
Chloride: 100 mEq/L (ref 96–112)
GFR calc Af Amer: >60 mL/min (ref >60)
Potassium: 4.3 mEq/L (ref 3.5–5.1)

## 2011-01-08 LAB — GLUCOSE, CAPILLARY
Glucose-Capillary: 217 mg/dL — ABNORMAL HIGH (ref 70–99)
Glucose-Capillary: 248 mg/dL — ABNORMAL HIGH (ref 70–99)
Glucose-Capillary: 256 mg/dL — ABNORMAL HIGH (ref 70–99)

## 2011-01-09 LAB — GLUCOSE, CAPILLARY
Glucose-Capillary: 269 mg/dL — ABNORMAL HIGH (ref 70–99)
Glucose-Capillary: 240 mg/dL — ABNORMAL HIGH (ref 70–99)

## 2011-01-10 DIAGNOSIS — M79609 Pain in unspecified limb: Secondary | ICD-10-CM

## 2011-01-10 LAB — GLUCOSE, CAPILLARY
Glucose-Capillary: 168 mg/dL — ABNORMAL HIGH (ref 70–99)
Glucose-Capillary: 273 mg/dL — ABNORMAL HIGH (ref 70–99)
Glucose-Capillary: 289 mg/dL — ABNORMAL HIGH (ref 70–99)

## 2011-01-10 LAB — HEMOGLOBIN A1C: Mean Plasma Glucose: 226 mg/dL — ABNORMAL HIGH (ref <117)

## 2011-01-11 LAB — GLUCOSE, CAPILLARY
Glucose-Capillary: 323 mg/dL — ABNORMAL HIGH (ref 70–99)
Glucose-Capillary: 236 mg/dL — ABNORMAL HIGH (ref 70–99)
Glucose-Capillary: 328 mg/dL — ABNORMAL HIGH (ref 70–99)

## 2011-01-11 NOTE — Op Note (Addendum)
NAMECLESTON, [name]                   ACCOUNT NO.:  [id number]  MEDICAL RECORD NO.:  [id number]  LOCATION:  XRAY                         FACILITY:  [facility]  PHYSICIAN:  Janetta Hora. Fields, MD  DATE OF BIRTH:  [birth year]  DATE OF PROCEDURE:  01/07/2011 DATE OF DISCHARGE:  01/07/2011                              OPERATIVE REPORT   PROCEDURE:  Left femoral to posterior tibial artery bypass using reversed left greater saphenous vein.  PREOPERATIVE DIAGNOSIS:  Rest pain, left foot.  POSTOPERATIVE DIAGNOSIS:  Rest pain, left foot.  ANESTHESIA:  General.  ASSISTANT:  Pecola Leisure, PA  OPERATIVE FINDINGS: 1. Left greater saphenous vein of good quality approximately 3-3.5 mm     in diameter. 2. Diffuse spasm around arterial anastomosis on completion arteriogram     with 1-vessel runoff via the posterior tibial artery.  OPERATIVE DETAILS:  After obtaining informed consent, the patient was taken to the operating room.  The patient was placed in supine position on the operating table.  After induction of general anesthesia endotracheal intubation, the patient's entire left lower extremity was prepped and draped in usual sterile fashion.  Next, a curvilinear incision was made in the left groin, carried down through the subcutaneous tissues down the level of left common femoral artery.  This was soft in character.  Common femoral artery was dissected free circumferentially all the way at the level of the inguinal ligament and then down to the femoral bifurcation.  Vessel loops placed around the common femoral artery, profunda femoris, and superficial femoral artery. Attention was then turned to the medial portion incision and the greater saphenous vein was harvested through several skip incisions on the medial aspect of the left leg.  The vein was of good quality approximately 3.5-4 mm in diameter through most portions.  There were 2 varicosities, one in the proximal third of the vein,  and then one in the distal third of the vein.  The vein was harvested all the way down to the midcalf in the left leg.  The incision at the lowest portion of the left calf was deepened and the gastrocnemius muscle pulled posteriorly and the posterior tibial artery exposed by removing the overlying fascia.  The vessel was soft in caliber and of a reasonable diameter approximately 2.5 mm.  This was dissected free circumferentially and Vesseloops placed around it.  Vein was then doubly clipped distally and ligated proximally at the saphenofemoral junction.  The vein was then removed and inspected for hemostasis.  It was gently distended with heparinized saline.  A 2 areas of varicosity were oversewn with a running 7-0 Prolene suture to reinforce the strength in these 2 areas. The more proximal portion of the vein was an area approximately a 1.5 cm long and the more distal portion was about a centimeter long.  Vein was placed in a reversed configuration.  It was then slightly spatulated. The patient was given heparin bolus and after 2 minutes of circulation time, the common femoral artery was clamped and the profunda femoris and superficial profunda femoris arteries controlled with Vesseloops.  A longitudinal opening was made in the common femoral artery.  The vein  was then sewn in a reversed configuration end of vein to side of artery using a running 5-0 Prolene suture.  Just prior to completion anastomosis, it was forebled, backbled, and thoroughly flushed. Anastomosis was secured, clamps released, has good pulsatile flow in the vein graft immediately.  It was then marked for orientation and then brought through a tunnel.  A tunnel was created in a deep plane from the posterior tibial artery between the heads of the gastrocnemius muscle posterior to the knee and subsartorial all the way up to the level of the groin.  Vein was pulled down through this tunnel down to the level of the  posterior tibial artery.  The graft was briefly opened and there was good pulsatile flow confirming that there was no twist in the vein graft.  At this point, the posterior tibial artery was controlled proximally and distally with fine bulldog clamps.  Longitudinal opening was made in the posterior tibial artery and the vein graft was cut to length with the leg straightened and spatulated and sewn end of vein to side of artery using a running 6-0 Prolene suture.  Just prior to completion anastomosis, it was forebled, backbled, and thoroughly flushed.  Anastomosis was secured, clamps released.  There was good pulsatile flow in the artery immediately.  There was good Doppler flow around the anastomosis.  The posterior tibial Doppler signal at the ankle was faint initially, but then this became quite brisk and essentially triphasic in character after a few minutes.  Completion arteriogram was obtained using in-flow occlusion.  An introducer needle was placed at the level of the distal anastomosis, this showed a patent anastomosis with 1-vessel runoff via the posterior tibial artery.  There was diffuse spasm over a couple of centimeters proximal and distal anastomosis where the artery had been clamped.  This resolved almost completely by the end of case.  Introducer needle was removed from the vein graft and the hole repaired with a single 7-0 Prolene suture. Hemostasis was obtained in all incisions.  Then, the groin was closed in multiple layers of running 2-0 and 3-0 Vicryl suture and a 4-0 Vicryl subcuticular stitch in the skin.  The other incisions were closed with a running 3-0 Vicryl in the subcutaneous layer and staples in the skin. The patient had a palpable posterior tibial pulse at the level of the ankle at the end of the case.  The patient tolerate the procedure well and there were no complications.  Instrument, sponge, and needle counts were correct at the end of the case.  The  patient was taken to the recovery room in stable condition.     Janetta Hora. Fields, MD     CEF/MEDQ  D:  01/08/2011  T:  01/09/2011  Job:  [id number]  cc:   Nanetta Batty, M.D.  Electronically Signed by Fabienne Bruns MD on 03/19/2011 06:25:44 PM

## 2011-01-13 NOTE — Discharge Summary (Addendum)
[name], [name]                   ACCOUNT NO.:  [id number]  MEDICAL RECORD NO.:  [id number]  LOCATION:  2032                         FACILITY:  [facility]  PHYSICIAN:  Janetta Hora. Fields, MD  DATE OF BIRTH:  01-09-1956  DATE OF ADMISSION:  01/07/2011 DATE OF DISCHARGE:  01/12/2011                              DISCHARGE SUMMARY   CHIEF COMPLAINT:  Left femoral posterior tibial bypass with greater saphenous vein for rest pain in the left foot.  HISTORY OF PRESENT ILLNESS:  [name] is a 55 year old gentleman referred by Dr. [name] for evaluation of left leg pain.  He began to develop pain in the left leg and he developed numbness and tingling in May 2012.  He has had rest pain for several weeks.  His right leg is asymptomatic.  He has extensive vascular history.  He has stent placed in left leg in the popliteal area.  This subsequently occluded in April 2011 and the stenting procedure was redone.  He re-occluded in November 2012, and was restudied and recanalized.  He subsequently re-occluded. He has been managed conservatively at this time.  He had a previous right saphenous vein harvested from the lower leg for coronary bypass grafting as well as his left radial artery.  He is currently on Plavix and aspirin for the stents.  He was admitted for a left femoral to posterior tibial artery bypass.  PAST MEDICAL HISTORY:  Significant for: 1. Diabetes. 2. Hypercholesterolemia. 3. Coronary artery disease. 4. Peripheral vascular disease. 5. Coronary artery bypass grafting. 6. Cholecystectomy. 7. Appendectomy. 8. Two knee operations. 9. Kidney stones. 10.Carpal tunnel release.  HOSPITAL COURSE:  The patient was taken to the operating room on January 07, 2011, for a left femoral posterior tibial bypass using reverse left greater saphenous vein angiogram x1.  He had a palpable PT at the end of the case at the ankle.  He was begun on ambulation.  His sugars were followed daily.  His Lantus  was increased to 85 mg daily.  His calf was soft.  The wounds were healing well.  He was ambulating, voiding and taking p.o.  He had moderate swelling in the leg as expected.  He will be discharged to home on January 12, 2011.  DISCHARGE MEDICATIONS: 1. Hydrocodone 10/325 one tablet every 4-6 hours as needed for pain,     30 tablets were given. 2. Lantus was changed to 85 units at bedtime. 3. Aspirin 325 mg daily. 4. Plavix 75 mg daily. 5. Glyburide/metformin 2.5/500 mg twice daily. 6. Gabapentin 300 mg three times a day. 7. Lisinopril/hydrochlorothiazide 20/12.5 mg daily. 8. Metoprolol 25 mg daily. 9. Simvastatin 80 mg daily. 10.TriCor 145 mg daily.  FINAL DIAGNOSIS:  Rest pain.  The patient with left femoral to tibial occlusive disease status post left fem-pop bypass with vein.  His sugars were fairly well controlled while in-house.  His Lantus insulin was increased per diabetes coordinator.  Otherwise, his other medical issues were stable.  DISPOSITION:  The patient will be discharged to home.  He will follow up with Dr. Darrick Penna in 2 weeks for staple removal.     [name] Goo, PA-C  ______________________________ Janetta Hora Fields, MD    RR/MEDQ  D:  01/12/2011  T:  01/13/2011  Job:  [id number]  Electronically Signed by [name] Goo PA on 01/13/2011 02:28:03 PM Electronically Signed by Fabienne Bruns MD on 02/02/2011 09:47:26 AM

## 2011-01-19 ENCOUNTER — Encounter: Payer: Self-pay | Admitting: Vascular Surgery

## 2011-01-29 ENCOUNTER — Encounter: Payer: Self-pay | Admitting: Vascular Surgery

## 2011-01-29 ENCOUNTER — Ambulatory Visit (INDEPENDENT_AMBULATORY_CARE_PROVIDER_SITE_OTHER): Payer: BC Managed Care – PPO | Admitting: Vascular Surgery

## 2011-01-29 VITALS — BP 160/82 | HR 103 | Temp 97.4°F

## 2011-01-29 DIAGNOSIS — I739 Peripheral vascular disease, unspecified: Secondary | ICD-10-CM

## 2011-01-29 NOTE — Progress Notes (Signed)
Postop Left Fem-Tib on 01-07-11.

## 2011-01-29 NOTE — Progress Notes (Signed)
Patient is a 55 year old male who returns for followup today after a left femoral to posterior tibial bypass on 01/07/2011. He is ambulatory but is not really walking enough at this point to see if his claudication symptoms have resolved. He denies any fever or chills. He has had no significant drainage from his incisions.  Physical exam:  Left lower extremity-the groin incision is well healed all leg incisions are well-healed, he has a 2+ left posterior tibial pulse there is some edema in the left foot  Assessment: Patent left femoral to posterior tibial bypass  Plan: #1 patient will have a graft duplex in September  #2 patient will remain out of work for at least 2 more weeks

## 2011-02-01 NOTE — Op Note (Signed)
[name], [name]                   ACCOUNT NO.:  [id number]  MEDICAL RECORD NO.:  [id number]  LOCATION:  SDSC                         FACILITY:  [facility]  PHYSICIAN:  Juleen China IV, MDDATE OF BIRTH:  February 17, 1956  DATE OF PROCEDURE:  12/31/2010 DATE OF DISCHARGE:  12/31/2010                              OPERATIVE REPORT   SURGEON: 1. Durene Cal IV, MD  PREOPERATIVE DIAGNOSIS:  Left leg rest pain.  POSTOPERATIVE DIAGNOSIS:  Left leg rest pain.  PROCEDURES PERFORMED: 1. Ultrasound accessed right femoral artery. 2. Abdominal aortogram. 3. Bilateral runoff. 4. Second-order catheterization.  INDICATIONS:  [name] is a 55 year old gentleman who previously undergone left leg percutaneous revascularizations, which has subsequently occluded.  He now suffers from rest pain.  He comes in today for preoperative evaluation for bypass.  PROCEDURE:  The patient was identified in the holding area and taken to the room #8, and placed supine on the table.  Both groins were prepped and draped in usual fashion.  A time-out was called.  The right femoral artery was evaluated with ultrasound and was found to be patent. Lidocaine 1% was used for local anesthesia.  The right femoral artery was accessed under ultrasound guidance with an 18-gauge needle.  An 0.035 wire was advanced into the aorta under fluoroscopic visualization. A 5-French sheath was placed.  Over the wire, an Omniflush catheter was advanced to the level of L1.  Abdominal aortogram was obtained.  Next, using the Omniflush catheter and a Glidewire followed by an end-hole 4- [place] straight catheter, the aortic bifurcation was crossed.  Catheter was placed in the left external iliac artery and left leg runoff was performed.  Retrograde injections of the sheath were performed for right leg evaluation.  Aortogram:  Visualized portions of the suprarenal abdominal aorta showed no significant disease.  There are single renal  arteries bilaterally without stenosis.  The infrarenal abdominal aorta is widely patent. Bilateral common, external, and internal iliac arteries are widely patent.  Left lower extremity:  The left common femoral artery is widely patent. The left profunda femoral artery is widely patent.  The left superficial femoral artery is patent down to the adductor canal where there is a preexisting stent, which is occluded.  There is reconstitution of the popliteal artery at the level of the patella.  The patient has diffuse tibial disease.  The anterior tibial is patent down to the ankle.  The posterior tibial is occluded proximally, but reconstitutes in the midcalf and is the dominant vessel up to the foot.  Right lower extremity:  The right common femoral artery is widely patent.  The right profunda femoral artery is widely patent.  The right superficial femoral artery is widely patent.  There is approximately 30% stenosis of the proximal popliteal artery.  Popliteal artery behind the knee is somewhat irregular with luminal narrowing up to about 50%, diffuse tibial disease is present with peroneal being the dominant runoff.  After the above images were obtained, decision was made to terminate the procedure.  Catheters and wires were removed.  The patient was taken to the holding area for sheath pull.  IMPRESSION: 1. Occluded left superficial  femoral and popliteal stents with     reconstitution of the popliteal artery below the knee with diffuse     tibial disease.  Anterior tibial artery appears patent down to the     ankle where it terminates.  The posterior tibial artery is occluded     proximally, but reconstitutes in the midcalf and is the dominant     vessel across the ankle. 2. Moderate disease in the right leg.     Juleen China IV, MD     VWB/MEDQ  D:  12/31/2010  T:  01/01/2011  Job:  [id number]  Electronically Signed by Arelia Longest IV MD on 02/01/2011 10:57:36 PM

## 2011-02-18 ENCOUNTER — Ambulatory Visit (INDEPENDENT_AMBULATORY_CARE_PROVIDER_SITE_OTHER): Payer: BC Managed Care – PPO

## 2011-02-18 ENCOUNTER — Encounter (INDEPENDENT_AMBULATORY_CARE_PROVIDER_SITE_OTHER): Payer: BC Managed Care – PPO

## 2011-02-18 DIAGNOSIS — M7989 Other specified soft tissue disorders: Secondary | ICD-10-CM

## 2011-02-18 DIAGNOSIS — M79609 Pain in unspecified limb: Secondary | ICD-10-CM

## 2011-02-18 DIAGNOSIS — I7092 Chronic total occlusion of artery of the extremities: Secondary | ICD-10-CM

## 2011-02-18 NOTE — Assessment & Plan Note (Signed)
OFFICE VISIT  [name], [name] DOB:  [birth year]                                       02/18/2011 ZOXWR#:[id number]  The patient presents for evaluation of left lower extremity swelling. He is status post left femoral to posterior tibial artery bypass with vein on January 07, 2011 by Dr. Darrick [name].  The patient was seen in follow-up status post a procedure on January 29, 2011, at which time he was doing well.  He had a palpable pulse and denied fevers and chills.  The incisions were healing well, and he had minimal edema in the left foot. Today the patient states that he has continued to have persistent edema, and he is concerned about going back to work.  He does not complain of any increased pain.  However, he is concerned that when wearing steel- toed shoes at work, that with the increased swelling in his foot, that he will have difficulty.  He denies fevers, chills.  He states that he has increased his activity level significantly.  He is elevating his foot when he is not ambulating.  PHYSICAL EXAMINATION:  All the incisions are healing well.  There is 1+ edema present in the foot up to the level of the ankle.  The calf is soft and nontender.  The foot is warm and well-perfused.  Venous duplex performed on February 18, 2011 reveals no evidence of DVT. There is an enlarged lymph node noted in the left groin.  The left femoral to tibial bypass is patent.  ASSESSMENT/PLAN:  The patient is reassured that he does not have a DVT and that the swelling in his foot is to be expected with his surgery. He has increased his physical activity, and this is also likely a possible reason for increased edema.  The patient is encouraged to continue to increase his physical activity and to elevate his lower extremity when not active.  He has requested an additional week out of work and considering he stands on his feet for 10-12 hours a day wearing steel-toed shoes, this is reasonable.   The patient was written out of work until March 02, 2011.  He understands that this is set in stone unless he is reevaluated, and Dr. Darrick [name] feels that he is still not capable to return.  The patient knows to call with any questions, issues, or problems in the interim.  [name] Leisure, PA  [name] Kindle. Edilia [name], M.D. Electronically Signed  AY/MEDQ  D:  02/18/2011  T:  02/18/2011  Job:  [id number]

## 2011-03-03 NOTE — Procedures (Unsigned)
DUPLEX DEEP VENOUS EXAM - LOWER EXTREMITY  INDICATION:  Left lower extremity pain and swelling.  HISTORY:  Edema:  Yes. Trauma/Surgery:  Left femoral to tibial bypass graft placed 01/07/2011. Pain:  Yes. PE:  No. Previous DVT:  No. Anticoagulants:  No. Other:  DUPLEX EXAM:               CFV   SFV   PopV  PTV    GSV               R  L  R  L  R  L  R   L  R  L Thrombosis    o  o     o     o      o     Absent Spontaneous   +  +     +     +      + Phasic        +  +     +     +      + Augmentation  +  +     +     +      + Compressible  +  +     +     +      + Competent     +  +     +     +      +  Legend:  + - yes  o - no  p - partial  D - decreased  IMPRESSION: 1. No evidence of deep venous thrombosis involving the left lower     extremity. 2. Enlarged lymph node is present involving the left groin. 3. An anechoic area is noted at the level of the distal medial left     calf, measuring approximately 0.93 cm X 1.81 cm in diameter and     does not appear to be vascular in nature. 4. The proximal and distal anastomosis of the left femoral to tibial     bypass graft demonstrates normal multiphasic Doppler signals     present. 5. The contralateral common femoral vein appears patent with normal     spontaneous and phasic flow.   _____________________________ Janetta Hora Fields, MD  SH/MEDQ  D:  02/18/2011  T:  02/18/2011  Job:  [id number]

## 2011-03-13 ENCOUNTER — Encounter (INDEPENDENT_AMBULATORY_CARE_PROVIDER_SITE_OTHER): Payer: BC Managed Care – PPO

## 2011-03-13 DIAGNOSIS — I70229 Atherosclerosis of native arteries of extremities with rest pain, unspecified extremity: Secondary | ICD-10-CM

## 2011-03-13 DIAGNOSIS — L819 Disorder of pigmentation, unspecified: Secondary | ICD-10-CM

## 2011-03-13 DIAGNOSIS — Z48812 Encounter for surgical aftercare following surgery on the circulatory system: Secondary | ICD-10-CM

## 2011-03-18 ENCOUNTER — Encounter: Payer: Self-pay | Admitting: Vascular Surgery

## 2011-03-19 ENCOUNTER — Ambulatory Visit (INDEPENDENT_AMBULATORY_CARE_PROVIDER_SITE_OTHER): Payer: BC Managed Care – PPO | Admitting: Vascular Surgery

## 2011-03-19 ENCOUNTER — Encounter: Payer: Self-pay | Admitting: Vascular Surgery

## 2011-03-19 VITALS — BP 134/78 | HR 82 | Resp 16 | Ht 71.0 in | Wt 240.0 lb

## 2011-03-19 DIAGNOSIS — I70219 Atherosclerosis of native arteries of extremities with intermittent claudication, unspecified extremity: Secondary | ICD-10-CM

## 2011-03-19 NOTE — Progress Notes (Signed)
Pt returns for follow up today after left femoral to posterior tibial bypass with reversed greater saphenous vein in June.  He denies claudication or rest pain.  All incisions are healed.  PE:  Blood pressure 134/78, pulse 82, resp. rate 16, height 5\' 11"  (1.803 m), weight 240 lb (108.863 kg), SpO2 99.00%.  Left leg- all incisions healed mild edema, left great toenail ecchymosis from recent shoe trauma  Recent ABI 03/13/11 >1 biphasic  Assessment:  Patent bypass  Plan:   Followup Duplex of Graft in September

## 2011-04-08 ENCOUNTER — Encounter (INDEPENDENT_AMBULATORY_CARE_PROVIDER_SITE_OTHER): Payer: BC Managed Care – PPO | Admitting: *Deleted

## 2011-04-08 DIAGNOSIS — I739 Peripheral vascular disease, unspecified: Secondary | ICD-10-CM

## 2011-04-08 DIAGNOSIS — Z48812 Encounter for surgical aftercare following surgery on the circulatory system: Secondary | ICD-10-CM

## 2011-04-17 NOTE — Procedures (Unsigned)
BYPASS GRAFT EVALUATION  INDICATION:  Left fem PT graft.  HISTORY: Diabetes:  Yes. Cardiac: Hypertension:  Yes. Smoking:  No. Previous Surgery:  Left fem PTA graft, 01/07/2011.  SINGLE LEVEL ARTERIAL EXAM                              RIGHT              LEFT Brachial: Anterior tibial: Posterior tibial: Peroneal: Ankle/brachial index:  PREVIOUS ABI:  Date: 03/13/11  RIGHT:  1.12  LEFT:  1.01  LOWER EXTREMITY BYPASS GRAFT DUPLEX EXAM:  DUPLEX: 1. Patent left fem PTA graft with significantly elevated velocities at     the proximal anastomosis.  Inflow waveforms are triphasic, but     within the graft they are hyperemic. 2. This area is difficult to visualize due to collaterals, swelling,     and enlarged lymph nodes. 3. Unable to determine if elevated velocities are due to caliber     change from native vessel to vein graft or stenosis.  IMPRESSION:  Patent left femoral percutaneous transluminal angioplasty graft with elevated velocities, as described above.  ___________________________________________ Janetta Hora. Fields, MD  LT/MEDQ  D:  04/09/2011  T:  04/09/2011  Job:  [id number]

## 2011-04-22 ENCOUNTER — Encounter: Payer: Self-pay | Admitting: Vascular Surgery

## 2011-04-23 ENCOUNTER — Ambulatory Visit (INDEPENDENT_AMBULATORY_CARE_PROVIDER_SITE_OTHER): Payer: BC Managed Care – PPO | Admitting: Vascular Surgery

## 2011-04-23 ENCOUNTER — Encounter: Payer: Self-pay | Admitting: Vascular Surgery

## 2011-04-23 VITALS — BP 160/91 | HR 76 | Resp 20 | Ht 71.0 in | Wt 230.0 lb

## 2011-04-23 DIAGNOSIS — I70219 Atherosclerosis of native arteries of extremities with intermittent claudication, unspecified extremity: Secondary | ICD-10-CM

## 2011-04-23 DIAGNOSIS — I739 Peripheral vascular disease, unspecified: Secondary | ICD-10-CM | POA: Insufficient documentation

## 2011-04-23 NOTE — Progress Notes (Signed)
[facility] [place] HISTORY AND PHYSICAL   History of Present Illness:  Patient is a 55 y.o. year old male who presents for evaluation of possible recurrent stenosis of left femoral posterior tibial bypass with vein 12/2010.  Other medical problems include CAD, DM, hyperlipidemia.  All currently controlled and followed by primary care.  He denies claudication or rest pain in the left foot.  He still has some swelling.  His left leg is better than preop but still not 100%.  Recent duplex scan from September was reviewed which showed  Increased velocity at proximal anastomosis 461cm/s.  Past Medical History  Diagnosis Date  . CAD (coronary artery disease)   . Diabetes mellitus   . Hyperlipidemia   . Myocardial infarction 02/2001  . Peripheral vascular disease, unspecified   . Kidney stones   . Carpal tunnel syndrome     Past Surgical History  Procedure Date  . Coronary artery bypass graft   . Cholecystectomy   . Appendectomy   . Knee surgery     X 2  . Carpal tunnel release   . Shoulder surgery   . Femoral-tibial bypass graft 01/07/11    Left fem-posterior tibial BPG using reversed left GSV    Review of systems: Cardiac-denies chest pain, respiratory-denies shortness of breath  Social History History  Substance Use Topics  . Smoking status: Never Smoker   . Smokeless tobacco: Never Used  . Alcohol Use: No    Family History Family History  Problem Relation Age of Onset  . Cancer Mother   . Cancer Father     Allergies  No Known Allergies   Current Outpatient Prescriptions  Medication Sig Dispense Refill  . ANDROGEL PUMP 20.25 MG/ACT (1.62%) GEL       . aspirin 325 MG tablet Take 325 mg by mouth daily.        . clopidogrel (PLAVIX) 75 MG tablet Take 75 mg by mouth daily.        . fenofibrate (TRICOR) 145 MG tablet Take 145 mg by mouth daily.        [name] gabapentin (NEURONTIN) 300 MG capsule Take 300 mg by mouth 3 (three) times daily.        [name]  glyBURIDE-metformin (GLUCOVANCE) 2.5-500 MG per tablet Take 1 tablet by mouth 2 (two) times daily with a meal.        . insulin glargine (LANTUS) 100 UNIT/ML injection Inject 75 Units into the skin at bedtime.        [name] lisinopril-hydrochlorothiazide (PRINZIDE,ZESTORETIC) 20-12.5 MG per tablet Take 1 tablet by mouth daily.        . metoprolol tartrate (LOPRESSOR) 25 MG tablet Take 25 mg by mouth daily.        . simvastatin (ZOCOR) 80 MG tablet Take 80 mg by mouth at bedtime.          Physical Examination  Filed Vitals:   04/23/11 0840  BP: 160/91  Pulse: 76  Resp: 20  Height: 5\' 11"  (1.803 m)  Weight: 230 lb (104.327 kg)    Body mass index is 32.08 kg/(m^2).  General:  Alert and oriented, no acute distress HEENT: Normal Pulmonary: Clear to auscultation bilaterally Cardiac: Regular Rate and Rhythm without murmur Gastrointestinal: Soft, non-tender, non-distended, no mass, no scars Skin: No rash Extremity Pulses:  2+ right femoral, 1+ left femoral, 2+ left posterior tibial pulse Musculoskeletal: trace edema entire left lower extremity   DATA: Noninvasive vascular exam as above, ABI was 1.01  on the left 1.12 on the right   ASSESSMENT: Possible recurrent stenosis proximal left femoral posterior tibial bypass graft   PLAN: We'll schedule for arteriogram left lower extremity runoff possible angioplasty stenting of proximal anastomosis 4 05/08/2011. Risks benefits possible complications and procedure details were explained the patient today including but not limited to bleeding infection graft injury. He understands and agrees to proceed.

## 2011-05-01 LAB — URINALYSIS, ROUTINE W REFLEX MICROSCOPIC
Bilirubin Urine: NEGATIVE
Glucose, UA: >1000 — AB
Hgb urine dipstick: NEGATIVE
Ketones, ur: 40 — AB
Leukocytes, UA: NEGATIVE
Nitrite: NEGATIVE
Protein, ur: NEGATIVE
Specific Gravity, Urine: 1.044 — ABNORMAL HIGH
Urobilinogen, UA: 0.2
pH: 6

## 2011-05-01 LAB — DIFFERENTIAL
Basophils Absolute: 0
Basophils Relative: 0
Eosinophils Absolute: 0.1
Eosinophils Relative: 0
Lymphocytes Relative: 12
Lymphs Abs: 1.7
Monocytes Absolute: 0.9 — ABNORMAL HIGH
Monocytes Relative: 6
Neutro Abs: 12.5 — ABNORMAL HIGH
Neutrophils Relative %: 82 — ABNORMAL HIGH

## 2011-05-01 LAB — CBC
HCT: 43.7
Hemoglobin: 15.5
MCHC: 35.3
MCV: 83.9
Platelets: 341
RBC: 5.22
RDW: 12.4
WBC: 15.2 — ABNORMAL HIGH

## 2011-05-01 LAB — COMPREHENSIVE METABOLIC PANEL WITH GFR
ALT: 27
AST: 25
Albumin: 3.8
CO2: 25
Chloride: 99
Creatinine, Ser: 0.62
GFR calc Af Amer: >60
GFR calc non Af Amer: >60
Potassium: 4.1
Sodium: 136
Total Bilirubin: 1.6 — ABNORMAL HIGH

## 2011-05-01 LAB — POCT CARDIAC MARKERS
CKMB, poc: 4.9
CKMB, poc: 7.5
Myoglobin, poc: 116
Myoglobin, poc: 85.8
Operator id: 4761
Operator id: 4761
Troponin i, poc: <0.05
Troponin i, poc: <0.05

## 2011-05-01 LAB — LIPASE, BLOOD: Lipase: 12

## 2011-05-01 LAB — COMPREHENSIVE METABOLIC PANEL
Alkaline Phosphatase: 96
BUN: 12
Calcium: 9.9
Glucose, Bld: 379 — ABNORMAL HIGH
Total Protein: 5.5 — ABNORMAL LOW

## 2011-05-01 LAB — URINE MICROSCOPIC-ADD ON

## 2011-05-08 ENCOUNTER — Ambulatory Visit (HOSPITAL_COMMUNITY)
Admission: RE | Admit: 2011-05-08 | Discharge: 2011-05-08 | Disposition: A | Discharge status: Home / Self Care | Payer: BC Managed Care – PPO | Source: Ambulatory Visit | Attending: Vascular Surgery | Admitting: Vascular Surgery

## 2011-05-08 DIAGNOSIS — T82898A Other specified complication of vascular prosthetic devices, implants and grafts, initial encounter: Secondary | ICD-10-CM | POA: Insufficient documentation

## 2011-05-08 DIAGNOSIS — I70219 Atherosclerosis of native arteries of extremities with intermittent claudication, unspecified extremity: Secondary | ICD-10-CM

## 2011-05-08 DIAGNOSIS — Y832 Surgical operation with anastomosis, bypass or graft as the cause of abnormal reaction of the patient, or of later complication, without mention of misadventure at the time of the procedure: Secondary | ICD-10-CM | POA: Insufficient documentation

## 2011-05-08 LAB — POCT I-STAT, CHEM 8
Calcium, Ion: 1.18 mmol/L (ref 1.12–1.32)
Chloride: 106 mEq/L (ref 96–112)
HCT: 44 % (ref 39.0–52.0)
Hemoglobin: 15 g/dL (ref 13.0–17.0)
TCO2: 23 mmol/L (ref 0–100)

## 2011-05-08 LAB — GLUCOSE, CAPILLARY: Glucose-Capillary: 229 mg/dL — ABNORMAL HIGH (ref 70–99)

## 2011-05-12 NOTE — Op Note (Signed)
[name], [name]                   ACCOUNT NO.:  [id number]  MEDICAL RECORD NO.:  [id number]  LOCATION:  [facility]                         FACILITY:  [facility]  PHYSICIAN:  Janetta Hora. Yameli Delamater, MD  DATE OF BIRTH:  1955-09-07  DATE OF PROCEDURE:  05/08/2011 DATE OF DISCHARGE:                              OPERATIVE REPORT   PROCEDURE:  Left lower extremity arteriogram.  PREOPERATIVE DIAGNOSIS:  Vein graft stenosis.  POSTOPERATIVE DIAGNOSIS:  Vein graft stenosis.  ANESTHESIA:  Local.  INDICATIONS:  The patient recently had a left femoral to posterior tibial bypass in July of this year.  Recent duplex ultrasound showed stenosis at the proximal portion of the vein grafts.  He was taken to the [facility] today for further evaluation of this.  OPERATIVE DETAILS:  After obtaining informed consent, the patient was taken to the PV Lab.  The patient was placed in a supine position on the angio table.  Both groins were prepped and draped in usual fashion. Local anesthesia was infiltrated over the right common femoral artery. Introducer needle was used to cannulate the right common femoral artery without difficulty.  A 0.035 Versacore wire was then threaded up into the abdominal aorta under fluoroscopic guidance.  Next, a 5-French sheath was placed over the guidewire into the right common femoral artery.  This was thoroughly flushed with heparinized saline.  A 5- [place] crossover catheter was then brought up in the operative field. This was advanced over the guidewire and the guidewire pulled back and the left common iliac artery selectively catheterized.  The 0.035 angled Glidewire was then brought up in the operative field.  This was advanced down the left common iliac into the external iliac at the level of the common femoral artery.  The crossover catheter was then removed and replaced with a 5-French straight catheter.  Left lower extremity arteriogram was then performed through this catheter.   Left common femoral and distal external iliac artery was patent.  There was a 50% to 70% stenosis just after the hood of the anastomosis of the femoral posterior tibial bypass grafts.  The main portion diameter of the vein is 2.3 mm.  This was a reversed vein.  The stenosis extended over approximately 2 cm at most.  The profunda femoris artery was patent. The distal left superficial femoral artery was occluded chronically. The vein graft was then patent throughout the remainder of its course. The distal anastomosis was to the mid posterior tibial artery.  There was retrograde filling of the proximal posterior tibial artery as well as portions of the peroneal artery; however, the peroneal artery did not really fill at the level of the ankle.  The only visualized vessel at the level of the ankle of the foot was the posterior tibial artery.  The distal anastomosis was widely patent.  At this point, the 5-French straight catheter was removed over a guidewire.  The 5-French sheath was thoroughly flushed with heparinized saline and left in place to be pulled in the holding area.  The patient tolerated the procedure well.  There were no complications.  OPERATIVE FINDINGS:  50% to 70% proximal stenosis just after the proximal  anastomosis of the vein graft extending over 1-2 cm, although this is a reversed the vein graft and this may be due to caliber difference from that.  Actual measurements of the vein graft versus stenosis showed a 44% stenosis and is probably somewhat slightly higher than this.  It was discussed with the patient that we would obtain a followup duplex scan in approximately 4-6 weeks to see if the velocities have changed or stabilized and consider further intervention from that point if necessary.     Janetta Hora. Karen Kinnard, MD     CEF/MEDQ  D:  05/08/2011  T:  05/08/2011  Job:  [id number]  Electronically Signed by Fabienne Bruns MD on 05/12/2011 12:33:12 PM

## 2011-06-17 ENCOUNTER — Encounter: Payer: Self-pay | Admitting: Vascular Surgery

## 2011-06-18 ENCOUNTER — Encounter: Payer: Self-pay | Admitting: Vascular Surgery

## 2011-06-18 ENCOUNTER — Other Ambulatory Visit: Payer: Self-pay

## 2011-06-18 ENCOUNTER — Ambulatory Visit (INDEPENDENT_AMBULATORY_CARE_PROVIDER_SITE_OTHER): Payer: BC Managed Care – PPO | Admitting: Vascular Surgery

## 2011-06-18 VITALS — BP 148/82 | HR 84 | Resp 16 | Ht 71.0 in | Wt 239.0 lb

## 2011-06-18 DIAGNOSIS — I70219 Atherosclerosis of native arteries of extremities with intermittent claudication, unspecified extremity: Secondary | ICD-10-CM

## 2011-06-18 DIAGNOSIS — I739 Peripheral vascular disease, unspecified: Secondary | ICD-10-CM

## 2011-06-18 DIAGNOSIS — Z48812 Encounter for surgical aftercare following surgery on the circulatory system: Secondary | ICD-10-CM

## 2011-06-18 NOTE — Progress Notes (Signed)
HISTORY AND PHYSICAL     History of Present Illness:  Patient is a 55 y.o. year old male who presents for evaluation of possible recurrent stenosis of left femoral posterior tibial bypass with vein 12/2010.  Other medical problems include CAD, DM, hyperlipidemia.  All currently controlled and followed by primary care.  He now has claudication  in the left calf.  He still has some swelling.  Recent duplex scan from September was reviewed which showed  Increased velocity at proximal anastomosis 461cm/s. Arteriogram showed narrowing of the vein proximally mainly due to size caliber and it was not amenable to PTA.     Past Medical History   Diagnosis  Date   .  CAD (coronary artery disease)     .  Diabetes mellitus     .  Hyperlipidemia     .  Myocardial infarction  02/2001   .  Peripheral vascular disease, unspecified     .  Kidney stones     .  Carpal tunnel syndrome         Past Surgical History   Procedure  Date   .  Coronary artery bypass graft     .  Cholecystectomy     .  Appendectomy     .  Knee surgery         X 2   .  Carpal tunnel release     .  Shoulder surgery     .  Femoral-tibial bypass graft  01/07/11       Left fem-posterior tibial BPG using reversed left GSV      Review of systems: Cardiac-denies chest pain, respiratory-denies shortness of breath   Social History History   Substance Use Topics   .  Smoking status:  Never Smoker    .  Smokeless tobacco:  Never Used   .  Alcohol Use:  No      Family History Family History   Problem  Relation  Age of Onset   .  Cancer  Mother     .  Cancer  Father        Allergies   No Known Allergies      Current Outpatient Prescriptions   Medication  Sig  Dispense  Refill   .  ANDROGEL PUMP 20.25 MG/ACT (1.62%) GEL           .  aspirin 325 MG tablet  Take 325 mg by mouth daily.           .  clopidogrel (PLAVIX) 75 MG tablet  Take 75 mg by mouth daily.           .  fenofibrate (TRICOR) 145 MG tablet  Take 145 mg  by mouth daily.           [name]  gabapentin (NEURONTIN) 300 MG capsule  Take 300 mg by mouth 3 (three) times daily.           [name]  glyBURIDE-metformin (GLUCOVANCE) 2.5-500 MG per tablet  Take 1 tablet by mouth 2 (two) times daily with a meal.           .  insulin glargine (LANTUS) 100 UNIT/ML injection  Inject 75 Units into the skin at bedtime.           [name]  lisinopril-hydrochlorothiazide (PRINZIDE,ZESTORETIC) 20-12.5 MG per tablet  Take 1 tablet by mouth daily.           .  metoprolol tartrate (LOPRESSOR) 25 MG tablet  Take  25 mg by mouth daily.           .  simvastatin (ZOCOR) 80 MG tablet  Take 80 mg by mouth at bedtime.              Physical Examination    Filed Vitals:   06/18/11 1416  BP: 148/82  Pulse: 84  Resp: 16  Height: 5\' 11"  (1.803 m)  Weight: 239 lb (108.41 kg)  SpO2: 98%   Body mass index is 32.08 kg/(m^2).   General:  Alert and oriented, no acute distress HEENT: Normal Pulmonary: Clear to auscultation bilaterally Cardiac: Regular Rate and Rhythm without murmur Gastrointestinal: Soft, non-tender, non-distended, no mass, no scars Skin: No rash Extremity Pulses:  2+ right femoral, 1+ left femoral, absent pedal pulses on the left Musculoskeletal: trace edema entire left lower extremity       DATA: Noninvasive vascular exam as above, ABI was 1.01 on the left 0.67 on the right. 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 Risks benefits possible complications and procedure details were explained the patient today including but not limited to bleeding infection graft injury. He understands and agrees to proceed. It was also discussed with the patient today that being such a young  age with multiple failures a revascularization his left lower extremity that certainly he is at risk for limb loss. Stop his Plavix today and covered and aspirin alone. His bypass is scheduled for December the 5th         Not recorded              Level of Service     PR OFFICE/OUTPT VISIT,EST,LEVL III [id number]      Follow-up and Disposition     Return for Pt for arteriogram 05/08/11.        All Flowsheet Templates (all recorded)     Encounter Vitals Flowsheet    Custom Formula Data Flowsheet    Anthropometrics Flowsheet               Referring Provider          Pearla Dubonnet, MD       All Charges for This Encounter       Code Description Service Date Service Provider Modifiers Quantity    99213 PR OFFICE/OUTPT VISIT,EST,LEVL III 04/23/2011 Sherren Kerns, MD   1        Other Encounter Related Information     Allergies & Medications         Problem List         History         Patient-Entered Questionnaires     No data filed

## 2011-06-22 ENCOUNTER — Other Ambulatory Visit: Payer: Self-pay

## 2011-06-22 DIAGNOSIS — I739 Peripheral vascular disease, unspecified: Secondary | ICD-10-CM

## 2011-06-23 ENCOUNTER — Inpatient Hospital Stay (HOSPITAL_COMMUNITY): Admission: RE | Admit: 2011-06-23 | Discharge: 2011-06-23 | Payer: BC Managed Care – PPO | Source: Ambulatory Visit

## 2011-06-24 ENCOUNTER — Encounter (HOSPITAL_COMMUNITY): Admission: RE | Payer: Self-pay | Source: Ambulatory Visit

## 2011-06-24 ENCOUNTER — Inpatient Hospital Stay (HOSPITAL_COMMUNITY)
Admission: RE | Admit: 2011-06-24 | Payer: BC Managed Care – PPO | Source: Ambulatory Visit | Admitting: Vascular Surgery

## 2011-06-24 SURGERY — CREATION, BYPASS, ARTERIAL, FEMORAL TO TIBIAL, USING GRAFT
Anesthesia: General | Laterality: Left

## 2011-06-25 ENCOUNTER — Telehealth: Payer: Self-pay | Admitting: Vascular Surgery

## 2011-06-25 NOTE — Telephone Encounter (Signed)
I discussed with the patient that he has a subtotal occlusion of his left fe femoral to posterior tibial artery bypass and currently only has claudication symptoms. I believe that revision of this would most likely require prosthetic below the knee. The durability of this would be limited. I believe that this would potentially accelerate his symptoms it is occluded in the near future. He currently has claudication symptoms with an ABI of 0.76. I believe the best option at this point would be conservative management with continued observation. If he develops rest pain or tissue loss in the future and we would consider a redo posterior tibial bypass. I would not consider redo in a bypass at this point for claudication symptoms alone. The patient was in agreement with this plan. We will schedule him for followup in the near future. He'll return sooner if he has rest pain or tissue loss symptoms.

## 2011-07-16 ENCOUNTER — Encounter: Payer: Self-pay | Admitting: Vascular Surgery

## 2011-07-16 ENCOUNTER — Ambulatory Visit (INDEPENDENT_AMBULATORY_CARE_PROVIDER_SITE_OTHER): Payer: BC Managed Care – PPO | Admitting: Vascular Surgery

## 2011-07-16 VITALS — BP 147/81 | HR 89 | Resp 16 | Ht 71.0 in | Wt 240.0 lb

## 2011-07-16 DIAGNOSIS — Z48812 Encounter for surgical aftercare following surgery on the circulatory system: Secondary | ICD-10-CM

## 2011-07-16 DIAGNOSIS — I739 Peripheral vascular disease, unspecified: Secondary | ICD-10-CM

## 2011-07-16 NOTE — Progress Notes (Signed)
LLE arterial duplex performed @ [facility] 07/16/2011

## 2011-07-16 NOTE — Progress Notes (Signed)
History of Present Illness: Patient is a 55 y.o. year old male who presents for evaluation of PAD.  He has known recurrent stenosis and most likely occlusion of a left femoral posterior tibial bypass with vein 12/2010. Other medical problems include CAD, DM, hyperlipidemia. All currently controlled and followed by primary care. He now has claudication in the left calf. This occurs at 2-3 blocks.  He still has some swelling. Recent duplex scan from September was reviewed which showed Increased velocity at proximal anastomosis 461cm/s with no flow in the middle section of the graft. Arteriogram showed narrowing of the vein proximally mainly due to size caliber and it was not amenable to PTA prior to this.   Past Medical History  Diagnosis Date  . CAD (coronary artery disease)  . Diabetes mellitus  . Hyperlipidemia  . Myocardial infarction 02/2001  . Peripheral vascular disease, unspecified  . Kidney stones  . Carpal tunnel syndrome  Past Surgical History  Procedure Date  . Coronary artery bypass graft  . Cholecystectomy  . Appendectomy  . Knee surgery  X 2  . Carpal tunnel release  . Shoulder surgery  . Femoral-tibial bypass graft 01/07/11  Left fem-posterior tibial BPG using reversed left GSV   Review of systems: Cardiac-denies chest pain, respiratory-denies shortness of breath   Social History  History  Substance Use Topics  . Smoking status: Never Smoker  . Smokeless tobacco: Never Used  . Alcohol Use: No  Family History  Family History  Problem Relation Age of Onset  . Cancer Mother  . Cancer Father  Allergies  No Known Allergies  Current Outpatient Prescriptions  Medication Sig Dispense Refill  . ANDROGEL PUMP 20.25 MG/ACT (1.62%) GEL  . aspirin 325 MG tablet Take 325 mg by mouth daily.  . clopidogrel (PLAVIX) 75 MG tablet Take 75 mg by mouth daily.  . fenofibrate (TRICOR) 145 MG tablet Take 145 mg by mouth daily.  [name] gabapentin (NEURONTIN) 300 MG capsule Take 300 mg by  mouth 3 (three) times daily.  [name] glyBURIDE-metformin (GLUCOVANCE) 2.5-500 MG per tablet Take 1 tablet by mouth 2 (two) times daily with a meal.  . insulin glargine (LANTUS) 100 UNIT/ML injection Inject 75 Units into the skin at bedtime.  [name] lisinopril-hydrochlorothiazide (PRINZIDE,ZESTORETIC) 20-12.5 MG per tablet Take 1 tablet by mouth daily.  . metoprolol tartrate (LOPRESSOR) 25 MG tablet Take 25 mg by mouth daily.  . simvastatin (ZOCOR) 80 MG tablet Take 80 mg by mouth at bedtime.  Physical Examination  Filed Vitals:    06/18/11 1416   BP:  148/82   Pulse:  84   Resp:  16   Height:  5\' 11"  (1.803 m)   Weight:  239 lb (108.41 kg)   SpO2:  98%   Body mass index is 32.08 kg/(m^2).  General: Alert and oriented, no acute distress  Extremity Pulses: 2+ right femoral, 2+ left femoral, absent pedal pulses on the left  Musculoskeletal: trace edema entire left lower extremity   DATA: Noninvasive vascular exam as above, ABI was 1.01 on the left 0.67 on the right. That his duplex scan which shows either a very high-grade stenosis or possibly occlusion with some filling of the distal and proximal aspect of the graft This is unchanged from his prior visit.   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 Since he currently has an ABI  of 0.67 and has claudication symptoms alone I'm going to reserve this procedure and until that time he has critical ischemia. He will continue to walk 30 minutes daily. We will see him in close followup. He'll return for followup in 3 months time with repeat ABIs.

## 2011-07-16 NOTE — Progress Notes (Signed)
ABI performed 07/16/2011 @ [facility]

## 2011-07-17 NOTE — Procedures (Unsigned)
BYPASS GRAFT EVALUATION  INDICATION:  Peripheral vascular disease.  HISTORY: Diabetes:  Yes. Cardiac:  No. Hypertension:  Yes. Smoking:  No. Previous Surgery:  Left femoral to posterior tibial artery bypass graft on 01/07/2011.  SINGLE LEVEL ARTERIAL EXAM                              RIGHT              LEFT Brachial: Anterior tibial: Posterior tibial: Peroneal: Ankle/brachial index:        0.95               0.67  PREVIOUS ABI:  Date:  04/08/2011  RIGHT:  1.07  LEFT:  0.97  LOWER EXTREMITY BYPASS GRAFT DUPLEX EXAM:  DUPLEX: 1. Elevated velocities suggestive of greater than 75% stenosis with     small vessel caliber present and a peak systolic velocity of 544     cm/s. 2. No Doppler waveform or color flow could be obtained within the mid     graft segment, suggestive of graft occlusion. 3. Reconstitution of flow identified at the knee segment and followed     in continuity to a patent distal anastomosis.   IMPRESSION: 1. Short segment occlusion with proximal anastomotic stenosis of the     left femoral to posterior tibial artery bypass graft. 2. Right ankle brachial index is 0.95 and considered normal. 3. The left ankle brachial index is 0.67 and is in the moderate     claudication range. 4. A decrease in the ankle brachial index with stenosis present on the     left since study on 04/08/2011.  ___________________________________________ Janetta Hora. Fields, MD  SH/MEDQ  D:  06/18/2011  T:  06/18/2011  Job:  [id number]

## 2011-07-31 NOTE — Procedures (Unsigned)
BYPASS GRAFT EVALUATION  INDICATION:  Follow up peripheral vascular disease.  HISTORY: Diabetes:  Yes. Cardiac:  No. Hypertension:  Yes. Smoking:  No. Previous Surgery:  Left femoral to posterior tibial artery bypass graft on 01/07/2011.  SINGLE LEVEL ARTERIAL EXAM                              RIGHT              LEFT Brachial: Anterior tibial: Posterior tibial: Peroneal: Ankle/brachial index:        1.07               0.60  PREVIOUS ABI:  Date: 06/18/2011  RIGHT:  0.95  LEFT:  0.67  LOWER EXTREMITY BYPASS GRAFT DUPLEX EXAM:  DUPLEX: 1. Elevated velocities of >75% stenosis with small vessel caliber     present, and a peak systolic velocity of 508 cm/s. 2. No Doppler waveform or color-flow could be obtained within the mid     graft segment, suggestive of graft occlusion. 3. Reconstitution of flow identified at the knee segment and followed     in continuity to a patent distal anastomosis.  IMPRESSION: 1. Short segment occlusion with proximal anastomotic stenosis of the     left femoral to posterior tibial bypass graft. 2. Right ankle brachial index is 1.07 and considered normal. 3. Left ankle brachial index is 0.61 and in the moderate claudication     range with a slight decrease since the study on 06/18/2011.  ___________________________________________ Janetta Hora. Fields, MD  SH/MEDQ  D:  07/16/2011  T:  07/16/2011  Job:  [id number]

## 2011-10-12 ENCOUNTER — Other Ambulatory Visit: Payer: Self-pay | Admitting: *Deleted

## 2011-10-12 DIAGNOSIS — I739 Peripheral vascular disease, unspecified: Secondary | ICD-10-CM

## 2011-10-14 ENCOUNTER — Encounter: Payer: Self-pay | Admitting: Vascular Surgery

## 2011-10-15 ENCOUNTER — Encounter: Payer: Self-pay | Admitting: Vascular Surgery

## 2011-10-15 ENCOUNTER — Ambulatory Visit (INDEPENDENT_AMBULATORY_CARE_PROVIDER_SITE_OTHER): Payer: BC Managed Care – PPO | Admitting: Vascular Surgery

## 2011-10-15 ENCOUNTER — Other Ambulatory Visit: Payer: Self-pay | Admitting: *Deleted

## 2011-10-15 VITALS — BP 145/75 | HR 89 | Resp 20 | Ht 71.0 in | Wt 238.0 lb

## 2011-10-15 DIAGNOSIS — I70219 Atherosclerosis of native arteries of extremities with intermittent claudication, unspecified extremity: Secondary | ICD-10-CM

## 2011-10-15 DIAGNOSIS — I739 Peripheral vascular disease, unspecified: Secondary | ICD-10-CM

## 2011-10-15 DIAGNOSIS — Z48812 Encounter for surgical aftercare following surgery on the circulatory system: Secondary | ICD-10-CM

## 2011-10-15 NOTE — Progress Notes (Signed)
Patient returns for followup today. He previously underwent a left femoral to posterior tibial artery bypass graft which failed. He currently still extremity claudication symptoms in his left leg. He currently has claudication at about one block. 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 However, he does not appear to have rest pain. He has had no nonhealing ulcers. He states his walking distance has not changed. He is trying to walk on a daily basis and also try to walk some on-and-off worse rather than riding a cart.  Review of systems: He denies chest pain or shortness of breath  Physical exam: Filed Vitals:   10/15/11 1412  BP: 145/75  Pulse: 89  Resp: 20  Height: 5\' 11"  (1.803 m)  Weight: 238 lb (107.956 kg)  SpO2: 98%    Lower extremities: 2+ femoral pulses, absent popliteal and pedal pulses left foot  Skin: No ulcer or rash on the foot  Data: Patient had bilateral ABIs today which I reviewed and interpreted. The right leg is an ABI 1.01 with triphasic waveforms left is 0.64 with monophasic waveforms this is unchanged from 07/16/2011.  Assessment: Claudication left lower extremity with superficial femoral popliteal occlusive disease. He currently does not have limb threatening ischemia. His ABIs are stable. He has short distance claudication.  Plan: Continue daily walking program and risk factor modification. The patient will return for followup ABIs in 6 months time. He'll return sooner if he develops rest pain or nonhealing wounds on the left foot.  Fabienne Bruns, MD [facility] Office: [phone] Pager: [phone]

## 2011-12-08 ENCOUNTER — Encounter (HOSPITAL_COMMUNITY): Payer: Self-pay | Admitting: Pharmacy Technician

## 2011-12-10 ENCOUNTER — Other Ambulatory Visit (HOSPITAL_COMMUNITY): Payer: Self-pay | Admitting: Orthopaedic Surgery

## 2011-12-15 ENCOUNTER — Encounter (HOSPITAL_COMMUNITY)
Admission: RE | Admit: 2011-12-15 | Discharge: 2011-12-15 | Disposition: A | Discharge status: Home / Self Care | Payer: BC Managed Care – PPO | Source: Ambulatory Visit | Attending: Orthopaedic Surgery | Admitting: Orthopaedic Surgery

## 2011-12-15 ENCOUNTER — Encounter (HOSPITAL_COMMUNITY): Payer: Self-pay

## 2011-12-15 LAB — BASIC METABOLIC PANEL
CO2: 28 mEq/L (ref 19–32)
Chloride: 101 mEq/L (ref 96–112)
Potassium: 4.3 mEq/L (ref 3.5–5.1)
Sodium: 140 mEq/L (ref 135–145)

## 2011-12-15 LAB — CBC
HCT: 42 % (ref 39.0–52.0)
MCV: 85.9 fL (ref 78.0–100.0)
RBC: 4.89 MIL/uL (ref 4.22–5.81)
WBC: 9.7 10*3/uL (ref 4.0–10.5)

## 2011-12-15 NOTE — Progress Notes (Signed)
12/15/11 1444  OBSTRUCTIVE SLEEP APNEA  Have you ever been diagnosed with sleep apnea through a sleep study? No  Do you snore loudly (loud enough to be heard through closed doors)?  0  Do you often feel tired, fatigued, or sleepy during the daytime? 0  Has anyone observed you stop breathing during your sleep? 0  Do you have, or are you being treated for high blood pressure? 1  BMI more than 35 kg/m2? 1  Age over 58 years old? 1  Neck circumference greater than 40 cm/18 inches? 0  Gender: 1  Obstructive Sleep Apnea Score 4   Score 4 or greater  Updated health history;Results sent to PCP

## 2011-12-15 NOTE — Patient Instructions (Addendum)
20 BURLIN MCNAIR  12/15/2011   Your procedure is scheduled on:  12/18/11  Friday   Surgery [id number]  Report to [facility] at  0515     AM.  Call this number if you have problems the morning of surgery: [phone]     Or PST   [id number]  [facility]             DO NOT TAKE ANY MEDICINE FOR BLOOD SUGAR ON Friday MORNING  Remember:             TAKE 1/2 INSULIN INJECTION ON Thursday NIGHT  Do not eat food: or fluids After Midnight. Thursday NIGHT        BUT HAVE SNACK BEFORE BEDTIME ON THURSDAY     Clear liquids include soda, tea, black coffee, apple or grape juice, broth.  Take these medicines the morning of surgery with A SIP OF WATER: NEURONTIN, LOPRESSOR, PLAVIX   Do not wear jewelry, make-up or nail polish.  Do not wear lotions, powders, or perfumes. You may wear deodorant.  Do not shave 48 hours prior to surgery.  Do not bring valuables to the hospital.  Contacts, dentures or bridgework may not be worn into surgery.  Leave suitcase in the car. After surgery it may be brought to your room.  For patients admitted to the hospital, checkout time is 11:00 AM the day of discharge.   Patients discharged the day of surgery will not be allowed to drive home.  Name and phone number of your driver: Flint Melter                                                                     Special Instructions: CHG Shower Use Special Wash: 1/2 bottle night before surgery and 1/2 bottle morning of surgery. REGULAR SOAP FACE AND PRIVATES                           MEN-MAY SHAVE FACE MORNING OF SURGERY  Please read over the following fact sheets that you were given: MRSA Information

## 2011-12-15 NOTE — Pre-Procedure Instructions (Signed)
Per Dr Acey Lav- OK to take Plavix am of surgery- pt instructed at PST visit

## 2011-12-15 NOTE — Pre-Procedure Instructions (Signed)
Per Cordelia Pen at Dr Vevelyn Royals office- per MD OK for pt to continue aspirin and Plavix

## 2011-12-18 ENCOUNTER — Ambulatory Visit (HOSPITAL_COMMUNITY)
Admission: RE | Admit: 2011-12-18 | Discharge: 2011-12-19 | Disposition: A | Discharge status: Home / Self Care | Payer: BC Managed Care – PPO | Source: Ambulatory Visit | Attending: Orthopaedic Surgery | Admitting: Orthopaedic Surgery

## 2011-12-18 ENCOUNTER — Encounter (HOSPITAL_COMMUNITY)
Admission: RE | Disposition: A | Discharge status: Home / Self Care | Payer: Self-pay | Source: Ambulatory Visit | Attending: Orthopaedic Surgery

## 2011-12-18 ENCOUNTER — Encounter (HOSPITAL_COMMUNITY): Payer: Self-pay | Admitting: *Deleted

## 2011-12-18 ENCOUNTER — Ambulatory Visit (HOSPITAL_COMMUNITY): Payer: BC Managed Care – PPO | Admitting: *Deleted

## 2011-12-18 DIAGNOSIS — G5602 Carpal tunnel syndrome, left upper limb: Secondary | ICD-10-CM

## 2011-12-18 DIAGNOSIS — Z794 Long term (current) use of insulin: Secondary | ICD-10-CM | POA: Insufficient documentation

## 2011-12-18 DIAGNOSIS — G4730 Sleep apnea, unspecified: Secondary | ICD-10-CM | POA: Insufficient documentation

## 2011-12-18 DIAGNOSIS — I2510 Atherosclerotic heart disease of native coronary artery without angina pectoris: Secondary | ICD-10-CM | POA: Insufficient documentation

## 2011-12-18 DIAGNOSIS — M7542 Impingement syndrome of left shoulder: Secondary | ICD-10-CM

## 2011-12-18 DIAGNOSIS — M719 Bursopathy, unspecified: Secondary | ICD-10-CM | POA: Insufficient documentation

## 2011-12-18 DIAGNOSIS — G5600 Carpal tunnel syndrome, unspecified upper limb: Secondary | ICD-10-CM | POA: Insufficient documentation

## 2011-12-18 DIAGNOSIS — Z79899 Other long term (current) drug therapy: Secondary | ICD-10-CM | POA: Insufficient documentation

## 2011-12-18 DIAGNOSIS — Z01812 Encounter for preprocedural laboratory examination: Secondary | ICD-10-CM | POA: Insufficient documentation

## 2011-12-18 DIAGNOSIS — E119 Type 2 diabetes mellitus without complications: Secondary | ICD-10-CM | POA: Insufficient documentation

## 2011-12-18 DIAGNOSIS — M67919 Unspecified disorder of synovium and tendon, unspecified shoulder: Secondary | ICD-10-CM | POA: Insufficient documentation

## 2011-12-18 DIAGNOSIS — M7500 Adhesive capsulitis of unspecified shoulder: Secondary | ICD-10-CM

## 2011-12-18 DIAGNOSIS — M25819 Other specified joint disorders, unspecified shoulder: Secondary | ICD-10-CM | POA: Insufficient documentation

## 2011-12-18 DIAGNOSIS — E785 Hyperlipidemia, unspecified: Secondary | ICD-10-CM | POA: Insufficient documentation

## 2011-12-18 DIAGNOSIS — I252 Old myocardial infarction: Secondary | ICD-10-CM | POA: Insufficient documentation

## 2011-12-18 DIAGNOSIS — S43439A Superior glenoid labrum lesion of unspecified shoulder, initial encounter: Secondary | ICD-10-CM | POA: Insufficient documentation

## 2011-12-18 DIAGNOSIS — X58XXXA Exposure to other specified factors, initial encounter: Secondary | ICD-10-CM | POA: Insufficient documentation

## 2011-12-18 HISTORY — PX: CARPAL TUNNEL RELEASE: SHX101

## 2011-12-18 HISTORY — PX: SHOULDER ARTHROSCOPY: SHX128

## 2011-12-18 LAB — GLUCOSE, CAPILLARY
Glucose-Capillary: 234 mg/dL — ABNORMAL HIGH (ref 70–99)
Glucose-Capillary: 183 mg/dL — ABNORMAL HIGH (ref 70–99)
Glucose-Capillary: 196 mg/dL — ABNORMAL HIGH (ref 70–99)
Glucose-Capillary: 196 mg/dL — ABNORMAL HIGH (ref 70–99)

## 2011-12-18 SURGERY — SHOULDER ARTHROSCOPY WITH SUBACROMIAL DECOMPRESSION
Anesthesia: General | Site: Shoulder | Laterality: Left

## 2011-12-18 MED ORDER — ACETAMINOPHEN 10 MG/ML IV SOLN
INTRAVENOUS | Status: AC
  Filled 2011-12-18: qty 100

## 2011-12-18 MED ORDER — INSULIN ASPART 100 UNIT/ML ~~LOC~~ SOLN: 0.0000 [IU] | SUBCUTANEOUS | Status: DC

## 2011-12-18 MED ORDER — MEPERIDINE HCL 50 MG/ML IJ SOLN: 6.2500 mg | INTRAMUSCULAR | Status: DC | PRN

## 2011-12-18 MED ORDER — EPINEPHRINE HCL 1 MG/ML IJ SOLN
INTRAMUSCULAR | Status: DC | PRN
  Administered 2011-12-18: 1 mg

## 2011-12-18 MED ORDER — NEOSTIGMINE METHYLSULFATE 1 MG/ML IJ SOLN
INTRAMUSCULAR | Status: DC | PRN
  Administered 2011-12-18: 3 mg via INTRAVENOUS

## 2011-12-18 MED ORDER — METHOCARBAMOL 500 MG PO TABS
500.0000 mg | ORAL_TABLET | Freq: Four times a day (QID) | ORAL | Status: DC | PRN
  Administered 2011-12-18 – 2011-12-19 (×2): 500 mg via ORAL
  Filled 2011-12-18 (×2): qty 1

## 2011-12-18 MED ORDER — HYDROMORPHONE HCL PF 1 MG/ML IJ SOLN: 0.2500 mg | INTRAMUSCULAR | Status: DC | PRN

## 2011-12-18 MED ORDER — INSULIN ASPART 100 UNIT/ML ~~LOC~~ SOLN
SUBCUTANEOUS | Status: AC
  Administered 2011-12-18: 5 [IU] via SUBCUTANEOUS
  Filled 2011-12-18: qty 1

## 2011-12-18 MED ORDER — METOCLOPRAMIDE HCL 10 MG PO TABS: 5.0000 mg | ORAL_TABLET | Freq: Three times a day (TID) | ORAL | Status: DC | PRN

## 2011-12-18 MED ORDER — GLYCOPYRROLATE 0.2 MG/ML IJ SOLN
INTRAMUSCULAR | Status: DC | PRN
  Administered 2011-12-18: 0.4 mg via INTRAVENOUS

## 2011-12-18 MED ORDER — CISATRACURIUM BESYLATE (PF) 10 MG/5ML IV SOLN
INTRAVENOUS | Status: DC | PRN
  Administered 2011-12-18: 6 mg via INTRAVENOUS
  Administered 2011-12-18 (×2): 2 mg via INTRAVENOUS

## 2011-12-18 MED ORDER — INSULIN ASPART 100 UNIT/ML ~~LOC~~ SOLN
0.0000 [IU] | Freq: Three times a day (TID) | SUBCUTANEOUS | Status: DC
  Administered 2011-12-18 – 2011-12-19 (×2): 4 [IU] via SUBCUTANEOUS

## 2011-12-18 MED ORDER — ZOLPIDEM TARTRATE 5 MG PO TABS: 5.0000 mg | ORAL_TABLET | Freq: Every evening | ORAL | Status: DC | PRN

## 2011-12-18 MED ORDER — PROMETHAZINE HCL 25 MG/ML IJ SOLN
6.2500 mg | INTRAMUSCULAR | Status: DC | PRN
  Administered 2011-12-18: 6.25 mg via INTRAVENOUS

## 2011-12-18 MED ORDER — MORPHINE SULFATE 2 MG/ML IJ SOLN
1.0000 mg | INTRAMUSCULAR | Status: DC | PRN
  Administered 2011-12-18 – 2011-12-19 (×3): 1 mg via INTRAVENOUS
  Filled 2011-12-18 (×3): qty 1

## 2011-12-18 MED ORDER — OXYCODONE HCL 5 MG PO TABS
5.0000 mg | ORAL_TABLET | ORAL | Status: DC | PRN
  Administered 2011-12-18 – 2011-12-19 (×2): 10 mg via ORAL
  Filled 2011-12-18 (×2): qty 2

## 2011-12-18 MED ORDER — MIDAZOLAM HCL 5 MG/5ML IJ SOLN
INTRAMUSCULAR | Status: DC | PRN
  Administered 2011-12-18 (×2): 1 mg via INTRAVENOUS

## 2011-12-18 MED ORDER — ASPIRIN 325 MG PO TABS
325.0000 mg | ORAL_TABLET | Freq: Every day | ORAL | Status: DC
  Administered 2011-12-19: 325 mg via ORAL
  Filled 2011-12-18 (×2): qty 1

## 2011-12-18 MED ORDER — GABAPENTIN 300 MG PO CAPS
300.0000 mg | ORAL_CAPSULE | Freq: Three times a day (TID) | ORAL | Status: DC
  Administered 2011-12-18 (×2): 300 mg via ORAL
  Filled 2011-12-18 (×5): qty 1

## 2011-12-18 MED ORDER — CLOPIDOGREL BISULFATE 75 MG PO TABS
75.0000 mg | ORAL_TABLET | Freq: Every day | ORAL | Status: DC
  Filled 2011-12-18: qty 1

## 2011-12-18 MED ORDER — INSULIN ASPART 100 UNIT/ML ~~LOC~~ SOLN: 0.0000 [IU] | Freq: Every day | SUBCUTANEOUS | Status: DC

## 2011-12-18 MED ORDER — ACETAMINOPHEN 10 MG/ML IV SOLN
INTRAVENOUS | Status: DC | PRN
  Administered 2011-12-18: 1000 mg via INTRAVENOUS

## 2011-12-18 MED ORDER — ROPIVACAINE HCL 5 MG/ML IJ SOLN
INTRAMUSCULAR | Status: DC | PRN
  Administered 2011-12-18: 30 mL

## 2011-12-18 MED ORDER — LACTATED RINGERS IV SOLN
INTRAVENOUS | Status: DC | PRN
  Administered 2011-12-18 (×2): via INTRAVENOUS

## 2011-12-18 MED ORDER — ONDANSETRON HCL 4 MG/2ML IJ SOLN: 4.0000 mg | Freq: Four times a day (QID) | INTRAMUSCULAR | Status: DC | PRN

## 2011-12-18 MED ORDER — SUCCINYLCHOLINE CHLORIDE 20 MG/ML IJ SOLN
INTRAMUSCULAR | Status: DC | PRN
  Administered 2011-12-18: 100 mg via INTRAVENOUS

## 2011-12-18 MED ORDER — BUPIVACAINE-EPINEPHRINE 0.25% -1:200000 IJ SOLN
INTRAMUSCULAR | Status: AC
  Filled 2011-12-18: qty 1

## 2011-12-18 MED ORDER — INSULIN GLARGINE 100 UNIT/ML ~~LOC~~ SOLN
70.0000 [IU] | Freq: Two times a day (BID) | SUBCUTANEOUS | Status: DC
  Administered 2011-12-18 – 2011-12-19 (×2): 70 [IU] via SUBCUTANEOUS

## 2011-12-18 MED ORDER — LISINOPRIL-HYDROCHLOROTHIAZIDE 20-12.5 MG PO TABS: 1.0000 | ORAL_TABLET | Freq: Every day | ORAL | Status: DC

## 2011-12-18 MED ORDER — METOPROLOL TARTRATE 25 MG PO TABS
25.0000 mg | ORAL_TABLET | Freq: Every day | ORAL | Status: DC
  Filled 2011-12-18: qty 1

## 2011-12-18 MED ORDER — EPINEPHRINE HCL 1 MG/ML IJ SOLN
INTRAMUSCULAR | Status: AC
  Filled 2011-12-18: qty 2

## 2011-12-18 MED ORDER — OXYCODONE-ACETAMINOPHEN 5-325 MG PO TABS: 1.0000 | ORAL_TABLET | ORAL | Status: AC | PRN

## 2011-12-18 MED ORDER — CEFAZOLIN SODIUM-DEXTROSE 2-3 GM-% IV SOLR: 2.0000 g | INTRAVENOUS | Status: DC

## 2011-12-18 MED ORDER — HYDROCODONE-ACETAMINOPHEN 5-325 MG PO TABS
1.0000 | ORAL_TABLET | ORAL | Status: DC | PRN
  Administered 2011-12-18 (×2): 1 via ORAL
  Filled 2011-12-18 (×2): qty 1

## 2011-12-18 MED ORDER — METFORMIN HCL 500 MG PO TABS
500.0000 mg | ORAL_TABLET | Freq: Two times a day (BID) | ORAL | Status: DC
  Administered 2011-12-18 – 2011-12-19 (×2): 500 mg via ORAL
  Filled 2011-12-18 (×4): qty 1

## 2011-12-18 MED ORDER — ONDANSETRON HCL 4 MG/2ML IJ SOLN
INTRAMUSCULAR | Status: DC | PRN
  Administered 2011-12-18 (×2): 2 mg via INTRAVENOUS

## 2011-12-18 MED ORDER — BUPIVACAINE HCL (PF) 0.25 % IJ SOLN
INTRAMUSCULAR | Status: AC
  Filled 2011-12-18: qty 30

## 2011-12-18 MED ORDER — HYDROMORPHONE HCL PF 1 MG/ML IJ SOLN
INTRAMUSCULAR | Status: DC | PRN
  Administered 2011-12-18 (×2): 1 mg via INTRAVENOUS

## 2011-12-18 MED ORDER — CEFAZOLIN SODIUM-DEXTROSE 2-3 GM-% IV SOLR
INTRAVENOUS | Status: AC
  Filled 2011-12-18: qty 50

## 2011-12-18 MED ORDER — DIPHENHYDRAMINE HCL 12.5 MG/5ML PO ELIX: 12.5000 mg | ORAL_SOLUTION | ORAL | Status: DC | PRN

## 2011-12-18 MED ORDER — METHOCARBAMOL 100 MG/ML IJ SOLN
500.0000 mg | Freq: Four times a day (QID) | INTRAVENOUS | Status: DC | PRN
  Filled 2011-12-18: qty 5

## 2011-12-18 MED ORDER — LACTATED RINGERS IR SOLN
Status: DC | PRN
  Administered 2011-12-18: 3000 mL

## 2011-12-18 MED ORDER — SODIUM CHLORIDE 0.9 % IV SOLN
INTRAVENOUS | Status: DC
  Administered 2011-12-18: 20 mL/h via INTRAVENOUS

## 2011-12-18 MED ORDER — ROPIVACAINE HCL 5 MG/ML IJ SOLN
INTRAMUSCULAR | Status: AC
  Filled 2011-12-18: qty 30

## 2011-12-18 MED ORDER — METOCLOPRAMIDE HCL 5 MG/ML IJ SOLN: 5.0000 mg | Freq: Three times a day (TID) | INTRAMUSCULAR | Status: DC | PRN

## 2011-12-18 MED ORDER — CEFAZOLIN SODIUM 1-5 GM-% IV SOLN
INTRAVENOUS | Status: DC | PRN
  Administered 2011-12-18: 2 g via INTRAVENOUS

## 2011-12-18 MED ORDER — LISINOPRIL 20 MG PO TABS
20.0000 mg | ORAL_TABLET | Freq: Every day | ORAL | Status: DC
  Filled 2011-12-18: qty 1

## 2011-12-18 MED ORDER — PHENYLEPHRINE HCL 10 MG/ML IJ SOLN
INTRAMUSCULAR | Status: DC | PRN
  Administered 2011-12-18 (×4): 20 ug via INTRAVENOUS
  Administered 2011-12-18: 40 ug via INTRAVENOUS

## 2011-12-18 MED ORDER — ONDANSETRON HCL 4 MG PO TABS: 4.0000 mg | ORAL_TABLET | Freq: Four times a day (QID) | ORAL | Status: DC | PRN

## 2011-12-18 MED ORDER — ETOMIDATE 2 MG/ML IV SOLN
INTRAVENOUS | Status: DC | PRN
  Administered 2011-12-18: 16 mg via INTRAVENOUS

## 2011-12-18 MED ORDER — LIDOCAINE HCL (CARDIAC) 20 MG/ML IV SOLN
INTRAVENOUS | Status: DC | PRN
  Administered 2011-12-18: 75 mg via INTRAVENOUS

## 2011-12-18 MED ORDER — FENTANYL CITRATE 0.05 MG/ML IJ SOLN
INTRAMUSCULAR | Status: DC | PRN
  Administered 2011-12-18 (×3): 50 ug via INTRAVENOUS

## 2011-12-18 MED ORDER — LACTATED RINGERS IV SOLN
INTRAVENOUS | Status: DC
  Administered 2011-12-18: 11:00:00 via INTRAVENOUS

## 2011-12-18 MED ORDER — GLYBURIDE-METFORMIN 2.5-500 MG PO TABS: 1.0000 | ORAL_TABLET | Freq: Two times a day (BID) | ORAL | Status: DC

## 2011-12-18 MED ORDER — HYDROCHLOROTHIAZIDE 12.5 MG PO CAPS
12.5000 mg | ORAL_CAPSULE | Freq: Every day | ORAL | Status: DC
  Filled 2011-12-18: qty 1

## 2011-12-18 MED ORDER — PROMETHAZINE HCL 25 MG/ML IJ SOLN
INTRAMUSCULAR | Status: AC
  Filled 2011-12-18: qty 1

## 2011-12-18 MED ORDER — BUPIVACAINE HCL 0.25 % IJ SOLN
INTRAMUSCULAR | Status: DC | PRN
  Administered 2011-12-18: 8 mL

## 2011-12-18 MED ORDER — FENOFIBRATE 160 MG PO TABS
160.0000 mg | ORAL_TABLET | Freq: Every day | ORAL | Status: DC
  Filled 2011-12-18 (×2): qty 1

## 2011-12-18 MED ORDER — GLYBURIDE 1.25 MG PO TABS
1.2500 mg | ORAL_TABLET | Freq: Two times a day (BID) | ORAL | Status: DC
  Administered 2011-12-18 – 2011-12-19 (×2): 1.25 mg via ORAL
  Filled 2011-12-18 (×4): qty 1

## 2011-12-18 SURGICAL SUPPLY — 62 items
ANCHOR PUSHLOCK PEEK 3.5X19.5 (Anchor) ×3 IMPLANT
BANDAGE ELASTIC 3 VELCRO ST LF (GAUZE/BANDAGES/DRESSINGS) ×6 IMPLANT
BANDAGE ELASTIC 4 VELCRO ST LF (GAUZE/BANDAGES/DRESSINGS) ×3 IMPLANT
BANDAGE GAUZE ELAST BULKY 4 IN (GAUZE/BANDAGES/DRESSINGS) ×3 IMPLANT
BLADE 4.2CUDA (BLADE) ×3 IMPLANT
BLADE SURG SZ11 CARB STEEL (BLADE) ×3 IMPLANT
BOOTIES KNEE HIGH SLOAN (MISCELLANEOUS) ×6 IMPLANT
BUR VERTEX HOODED 4.5 (BURR) ×3 IMPLANT
CANNULA 5.75X7 CRYSTAL CLEAR (CANNULA) ×3 IMPLANT
CANNULA SHOULDER 7CM (CANNULA) ×3 IMPLANT
CANNULA TWIST IN 8.25X7CM (CANNULA) IMPLANT
CLOTH BEACON ORANGE TIMEOUT ST (SAFETY) ×3 IMPLANT
CORDS BIPOLAR (ELECTRODE) ×3 IMPLANT
CUFF TOURN SGL QUICK 18 (TOURNIQUET CUFF) IMPLANT
CUFF TOURN SGL QUICK 24 (TOURNIQUET CUFF)
CUFF TRNQT CYL 24X4X40X1 (TOURNIQUET CUFF) IMPLANT
DRAPE INCISE IOBAN 66X45 STRL (DRAPES) ×3 IMPLANT
DRAPE SHOULDER BEACH CHAIR (DRAPES) ×3 IMPLANT
DRAPE SURG 17X11 SM STRL (DRAPES) IMPLANT
DRAPE U-SHAPE 47X51 STRL (DRAPES) ×3 IMPLANT
DRSG PAD ABDOMINAL 8X10 ST (GAUZE/BANDAGES/DRESSINGS) ×3 IMPLANT
DURAPREP 26ML APPLICATOR (WOUND CARE) ×3 IMPLANT
GAUZE XEROFORM 1X8 LF (GAUZE/BANDAGES/DRESSINGS) ×6 IMPLANT
GLOVE BIO SURGEON STRL SZ7.5 (GLOVE) ×3 IMPLANT
GLOVE BIO SURGEON STRL SZ8 (GLOVE) ×6 IMPLANT
GLOVE BIOGEL PI IND STRL 8 (GLOVE) ×2 IMPLANT
GLOVE BIOGEL PI INDICATOR 8 (GLOVE) ×1
GLOVE ECLIPSE 8.0 STRL XLNG CF (GLOVE) ×3 IMPLANT
GLOVE ORTHO TXT STRL SZ7.5 (GLOVE) ×3 IMPLANT
GOWN PREVENTION PLUS LG XLONG (DISPOSABLE) ×3 IMPLANT
GOWN PREVENTION PLUS XLARGE (GOWN DISPOSABLE) ×3 IMPLANT
GOWN STRL NON-REIN LRG LVL3 (GOWN DISPOSABLE) ×3 IMPLANT
GOWN STRL REIN XL XLG (GOWN DISPOSABLE) ×6 IMPLANT
IV LACTATED RINGER IRRG 3000ML (IV SOLUTION) ×3
IV LR IRRIG 3000ML ARTHROMATIC (IV SOLUTION) ×6 IMPLANT
KIT BASIN OR (CUSTOM PROCEDURE TRAY) ×3 IMPLANT
MANIFOLD NEPTUNE II (INSTRUMENTS) ×3 IMPLANT
NEEDLE HYPO 22GX1.5 SAFETY (NEEDLE) IMPLANT
NEEDLE HYPO 25X1 1.5 SAFETY (NEEDLE) IMPLANT
NEEDLE SCORPION (NEEDLE) ×3 IMPLANT
NEEDLE SPNL 18GX3.5 QUINCKE PK (NEEDLE) ×3 IMPLANT
NS IRRIG 1000ML POUR BTL (IV SOLUTION) ×3 IMPLANT
PACK LOWER EXTREMITY WL (CUSTOM PROCEDURE TRAY) ×3 IMPLANT
PACK SHOULDER CUSTOM OPM052 (CUSTOM PROCEDURE TRAY) ×3 IMPLANT
PAD CAST 4YDX4 CTTN HI CHSV (CAST SUPPLIES) ×2 IMPLANT
PADDING CAST COTTON 4X4 STRL (CAST SUPPLIES) ×1
POSITIONER SURGICAL ARM (MISCELLANEOUS) ×3 IMPLANT
SET ARTHROSCOPY TUBING (MISCELLANEOUS) ×1
SET ARTHROSCOPY TUBING LN (MISCELLANEOUS) ×2 IMPLANT
SLING ARM IMMOBILIZER LRG (SOFTGOODS) IMPLANT
SPONGE GAUZE 4X4 12PLY (GAUZE/BANDAGES/DRESSINGS) ×6 IMPLANT
STAPLER VISISTAT 35W (STAPLE) IMPLANT
STRIP CLOSURE SKIN 1/2X4 (GAUZE/BANDAGES/DRESSINGS) IMPLANT
SUCTION FRAZIER TIP 10 FR DISP (SUCTIONS) ×3 IMPLANT
SUT ETHILON 3 0 PS 1 (SUTURE) ×3 IMPLANT
SUT ETHILON 4 0 PS 2 18 (SUTURE) ×3 IMPLANT
SYR CONTROL 10ML LL (SYRINGE) IMPLANT
TAPE CLOTH SURG 4X10 WHT LF (GAUZE/BANDAGES/DRESSINGS) ×3 IMPLANT
TOWEL OR 17X26 10 PK STRL BLUE (TOWEL DISPOSABLE) ×6 IMPLANT
TUBING CONNECTING 10 (TUBING) ×3 IMPLANT
WAND 90 DEG TURBOVAC W/CORD (SURGICAL WAND) ×3 IMPLANT
WATER STERILE IRR 1500ML POUR (IV SOLUTION) ×3 IMPLANT

## 2011-12-18 NOTE — Anesthesia Postprocedure Evaluation (Signed)
  Anesthesia Post-op Note  Patient: [name]  Procedure(s) Performed: Procedure(s) (LRB): SHOULDER ARTHROSCOPY WITH SUBACROMIAL DECOMPRESSION (Left) CARPAL TUNNEL RELEASE (Left)  Patient Location: PACU  Anesthesia Type: GA combined with regional for post-op pain  Level of Consciousness: awake and alert   Airway and Oxygen Therapy: Patient Spontanous Breathing  Post-op Pain: mild  Post-op Assessment: Post-op Vital signs reviewed, Patient's Cardiovascular Status Stable, Respiratory Function Stable, Patent Airway and No signs of Nausea or vomiting  Post-op Vital Signs: stable  Complications: No apparent anesthesia complications

## 2011-12-18 NOTE — Anesthesia Preprocedure Evaluation (Addendum)
Anesthesia Evaluation  Patient identified by MRN, date of birth, ID band Patient awake    Reviewed: Allergy & Precautions, H&P , NPO status , Patient's Chart, lab work & pertinent test results  Airway Mallampati: II TM Distance: >3 FB Neck ROM: Full    Dental No notable dental hx.    Pulmonary neg pulmonary ROS, sleep apnea ,  breath sounds clear to auscultation  Pulmonary exam normal       Cardiovascular + CAD and + Past MI negative cardio ROS  Rhythm:Regular Rate:Normal     Neuro/Psych negative neurological ROS  negative psych ROS   GI/Hepatic negative GI ROS, Neg liver ROS,   Endo/Other  negative endocrine ROSDiabetes mellitus-, Insulin Dependent  Renal/GU negative Renal ROS  negative genitourinary   Musculoskeletal negative musculoskeletal ROS (+)   Abdominal   Peds negative pediatric ROS (+)  Hematology negative hematology ROS (+)   Anesthesia Other Findings   Reproductive/Obstetrics negative OB ROS                          Anesthesia Physical Anesthesia Plan  ASA: II  Anesthesia Plan: General   Post-op Pain Management:    Induction: Intravenous  Airway Management Planned: Oral ETT  Additional Equipment:   Intra-op Plan:   Post-operative Plan: Extubation in OR  Informed Consent: I have reviewed the patients History and Physical, chart, labs and discussed the procedure including the risks, benefits and alternatives for the proposed anesthesia with the patient or authorized representative who has indicated his/her understanding and acceptance.   Dental advisory given  Plan Discussed with: CRNA  Anesthesia Plan Comments:        Anesthesia Quick Evaluation

## 2011-12-18 NOTE — Preoperative (Signed)
Beta Blockers   Reason not to administer Beta Blockers:Not Applicablept took beta blocker this a.m. 

## 2011-12-18 NOTE — Anesthesia Procedure Notes (Signed)
Anesthesia Regional Block:  Supraclavicular block  Pre-Anesthetic Checklist: ,, timeout performed, Correct Patient, Correct Site, Correct Laterality, Correct Procedure, Correct Position, site marked, Risks and benefits discussed,  Surgical consent,  Pre-op evaluation,  At surgeon's request and post-op pain management  Laterality: Left  Prep: chloraprep       Needles:  Injection technique: Single-shot  Needle Type: Stimiplex     Needle Length:cm 4 cm Needle Gauge: 21 G    Additional Needles:  Procedures: ultrasound guided and nerve stimulator Supraclavicular block Narrative:  Injection made incrementally with aspirations every 5 mL.  Performed by: Personally  Anesthesiologist: Phillips Grout MD  Additional Notes: Risks, benefits and alternative to block explained extensively.  Patient tolerated procedure well, without complications.  Supraclavicular block

## 2011-12-18 NOTE — Progress Notes (Signed)
Dr. Acey Lav made aware of patient's CBG results in PACU- 198- no Insulin coverage to be given at this time- made aware of patient being diaphoretic when he first came in to PACU- now less diaphoretic- EKG readings- NSR- no complainats voiced by patient- Dr. Magnus Ivan plans to discharge patient today.

## 2011-12-18 NOTE — H&P (Signed)
[name] is an 56 y.o. male.   Chief Complaint:  1)  Left shoulder pain 2) left hand pain/numbness/tingling HPI:   56 yo male with multiple medical problems including severe PVD and heart disease.  I've followed him for some time now with severe left shoulder impingement and severe left carpal tunnel syndrome.  He has failed conservative treatment with multiple injections.  He wishes to proceed with surgery.  He understands the risks fully given his health status and does wish to proceed.  Past Medical History  Diagnosis Date  . Diabetes mellitus   . Hyperlipidemia   . Myocardial infarction 02/2001  . Kidney stones   . Sleep apnea     STOP BANG SCORE 4  . Carpal tunnel syndrome     peripheral neuropathy  . CAD (coronary artery disease)     OV, Dr Doristine Bosworth, [facility] 5/12 on chart  EKG 10/12 EPIC,  chest x ray 01/07/11 EPIC  . Peripheral vascular disease, unspecified     Past Surgical History  Procedure Date  . Cholecystectomy   . Appendectomy   . Knee surgery       arthroscopy  left x 2  . Carpal tunnel release     right  . Shoulder surgery     right arthroscopy  . Femoral-tibial bypass graft 01/07/11    Left fem-posterior tibial BPG using reversed left GSV               12/15/11 OK BY DR Magnus Ivan TO CONTINUE ASA AND PLAVIX  . Coronary artery bypass graft 2002  . Cardiac catheterization 2006  . Fracture surgery     left  radius/ulna  . Popiteal artery stent 2011    x 4    LEFT/ s/p thrombectomy    Family History  Problem Relation Age of Onset  . Cancer Mother   . Cancer Father    Social History:  reports that he has never smoked. He has never used smokeless tobacco. He reports that he does not drink alcohol or use illicit drugs.  Allergies: No Known Allergies  Medications Prior to Admission  Medication Sig Dispense Refill  . aspirin 325 MG tablet Take 325 mg by mouth daily with breakfast.       . clopidogrel (PLAVIX) 75 MG tablet Take 75 mg by mouth daily with  breakfast.       . fenofibrate (TRICOR) 145 MG tablet Take 145 mg by mouth daily with breakfast.       . gabapentin (NEURONTIN) 300 MG capsule Take 300 mg by mouth 3 (three) times daily.       [name] glyBURIDE-metformin (GLUCOVANCE) 2.5-500 MG per tablet Take 1 tablet by mouth 2 (two) times daily with a meal.       . insulin glargine (LANTUS) 100 UNIT/ML injection Inject 70 Units into the skin 2 (two) times daily.       [name] lisinopril-hydrochlorothiazide (PRINZIDE,ZESTORETIC) 20-12.5 MG per tablet Take 1 tablet by mouth daily with breakfast.       . metoprolol tartrate (LOPRESSOR) 25 MG tablet Take 25 mg by mouth daily with breakfast.         Results for orders placed during the hospital encounter of 12/18/11 (from the past 48 hour(s))  GLUCOSE, CAPILLARY     Status: Abnormal   Collection Time   12/18/11  6:00 AM      Component Value Range Comment   Glucose-Capillary 234 (*) 70 - 99 (mg/dL)    Comment 1  Documented in Chart      No results found.  Review of Systems  All other systems reviewed and are negative.    Blood pressure 138/81, pulse 78, temperature 97.8 F (36.6 C), temperature source Oral, resp. rate 18, SpO2 97.00%. Physical Exam  Constitutional: He is oriented to person, place, and time. He appears well-developed and well-nourished.  HENT:  Head: Normocephalic and atraumatic.  Eyes: EOM are normal. Pupils are equal, round, and reactive to light.  Neck: Normal range of motion. Neck supple.  Cardiovascular: Normal rate and regular rhythm.   Respiratory: Effort normal and breath sounds normal.  GI: Soft. Bowel sounds are normal.  Musculoskeletal:       Left shoulder: He exhibits decreased range of motion, tenderness and bony tenderness.       Left hand: decreased sensation noted. Decreased sensation is present in the medial distribution.  Neurological: He is alert and oriented to person, place, and time.  Skin: Skin is warm.  Psychiatric: He has a normal mood and affect.      Assessment/Plan To the OR today for a left shoulder arthroscopy with debridement and subacromial decompression and a left open carpal tunnel release.  Janissa Bertram Y 12/18/2011, 7:17 AM

## 2011-12-18 NOTE — Brief Op Note (Signed)
12/18/2011  9:18 AM  PATIENT:  Redge Gainer Akhavan  56 y.o. male  PRE-OPERATIVE DIAGNOSIS:  Left shoulder impingement syndrome, left severe carpal tunnel syndrome  POST-OPERATIVE DIAGNOSIS:  syndrome, left severe carpal tunnel syndrome, left shoulder impingment syndrome   PROCEDURE:  Procedure(s) (LRB): SHOULDER ARTHROSCOPY WITH SUBACROMIAL DECOMPRESSION (Left) CARPAL TUNNEL RELEASE (Left)  SURGEON:  Surgeon(s) and Role:    * [name], MD - Primary  PHYSICIAN ASSISTANT:   ASSISTANTS: none   ANESTHESIA:   local, regional and general  EBL:  Total I/O In: 1000 [I.V.:1000] Out: -   BLOOD ADMINISTERED:none  DRAINS: none   LOCAL MEDICATIONS USED:  MARCAINE     SPECIMEN:  No Specimen  DISPOSITION OF SPECIMEN:  N/A  COUNTS:  YES  TOURNIQUET:   Total Tourniquet Time Documented: Upper Arm (Left) - 9 minutes  DICTATION: .Other Dictation: Dictation Number [id number]  PLAN OF CARE: Discharge to home after PACU  PATIENT DISPOSITION:  PACU - hemodynamically stable.   Delay start of Pharmacological VTE agent (>24hrs) due to surgical blood loss or risk of bleeding: no

## 2011-12-18 NOTE — Transfer of Care (Signed)
Immediate Anesthesia Transfer of Care Note  Patient: [name]  Procedure(s) Performed: Procedure(s) (LRB): SHOULDER ARTHROSCOPY WITH SUBACROMIAL DECOMPRESSION (Left) CARPAL TUNNEL RELEASE (Left)  Patient Location: PACU  Anesthesia Type: GA combined with regional for post-op pain  Level of Consciousness: awake, oriented, patient cooperative and lethargic  Airway & Oxygen Therapy: Patient Spontanous Breathing and Patient connected to face mask oxygen  Post-op Assessment: Report given to PACU RN, Post -op Vital signs reviewed and stable and Patient moving all extremities  Post vital signs: Reviewed and stable  Complications: No apparent anesthesia complications

## 2011-12-19 MED ORDER — OXYCODONE-ACETAMINOPHEN 5-325 MG PO TABS: 1.0000 | ORAL_TABLET | ORAL | Status: AC | PRN

## 2011-12-19 NOTE — Progress Notes (Signed)
Discharged from floor via w/c, spouse with pt. No changes in assessment. Viona Hosking   

## 2011-12-19 NOTE — Plan of Care (Signed)
Problem: Discharge Progression Outcomes Goal: Tubes and drains discontinued if indicated Outcome: Completed/Met Date Met:  12/19/11 iv

## 2011-12-19 NOTE — Progress Notes (Signed)
Patient ID: [name], male   DOB: August 02, 1955, 56 y.o.   MRN: [id number] Doing great.  Can d/c home.

## 2011-12-19 NOTE — Op Note (Signed)
[name], [name]                   ACCOUNT NO.:  [id number]  MEDICAL RECORD NO.:  [id number]  LOCATION:  1610                         FACILITY:  [facility]  PHYSICIAN:  Vanita Panda. Magnus Ivan, M.D.DATE OF BIRTH:  [birth year]  DATE OF PROCEDURE:  12/18/2011 DATE OF DISCHARGE:                              OPERATIVE REPORT   PREOPERATIVE DIAGNOSES: 1. Left carpal tunnel syndrome. 2. Left shoulder impingement syndrome.  POSTOPERATIVE DIAGNOSES: 1. Left carpal tunnel syndrome. 2. Left shoulder impingement syndrome.  PROCEDURES: 1. Left open carpal tunnel release. 2. Left shoulder arthroscopy with limited debridement and subacromial     decompression.  SURGEON:  Vanita Panda. Magnus Ivan, M.D.  ANESTHESIA: 1. Left regional shoulder block. 2. General. 3. Local infiltration of left hand incision with 0.5% plain Marcaine.  BLOOD LOSS:  Minimal.  COMPLICATIONS:  None.  INDICATIONS:  Mr. Padget is a 56 year old gentleman with severe left shoulder impingement syndrome and severe carpal tunnel syndrome involving his left upper extremity.  He is someone also with heart disease and peripheral vascular disease, and it is recommended he undergo a left open carpal tunnel release and a left shoulder arthroscopy on the same setting due to his peripheral vascular disease event in clearance from his primary care physician, as well as vascular surgeon for this.  He has been on Plavix, but we are not stopping this secondary to the nature of this and severity of this disease.  He understands the risks and benefits of the surgery in detail, and does wish to proceed.  PROCEDURE DESCRIPTION:  After informed consent was obtained, appropriate left shoulder and upper extremity were marked.  Anesthesia was obtained with regional shoulder block.  He was brought to the operating room, placed supine on the operating table.  General anesthesia was then obtained.  We addressed the left carpal tunnel first.  A  nonsterile tourniquet was placed around his upper left arm, and his left hand and wrist and forearm were prepped and draped with DuraPrep and sterile drapes.  Time-out was called, identified the correct patient, correct left wrist.  I then used an Esmarch wrap out the wrist and tourniquet was inflated to 250 mm pressure.  I made an incision in the palm and dissected down to the distal edge of the transverse carpal ligament, which I was able to identify.  I protected the median nerve and then divided the transverse carpal ligament in its entirety, but under direct visualization.  This was very tight and thickened hypertrophic tissue. I then identified the median nerve and its motor branch.  I irrigated this tissues and then closed the incisions with interrupted 4-0 nylon suture.  I infiltrated the incision with 0.5% plain Marcaine.  I placed Xeroform and a well-padded sterile dressing.  We took the drapes off. We then repositioned him for the shoulder arthroscopic portion of the case.  His left shoulder was prepped and draped with DuraPrep and sterile arthroscopy drapes including sterile stockinette, and his left arm was placed in an in-line skeletal traction using the fishing pole traction device and 10 pounds of traction.  Time-out was called again, identified the correct patient, correct left shoulder.  I then made  a posterior arthroscopy portal after setting him up in the beach-chair position as well.  Of note, we did have sequential compression devices on his legs during the case as well.  I made a posterolateral arthroscopy portal then and entered the glenohumeral joint.  I found some degenerative labral tearing.  I made an anterior portal in the rotator interval.  I was able to perform a minimal debridement in the glenohumeral joint.  I found the subscapularis tendon and biceps tendon and the rotator cuff, all to be intact, and minimal inflamed tissue over the dense degenerative  SLAP tear that was debrided back to a stable margin.  I then entered the subacromial space through the posterior portal, made a separate lateral portal.  I found extensive bursitis and tendinosis of the rotator cuff in somewhat adequate space.  Using a soft tissue ablation wand, I was able to perform a complete bursectomy and clean the tissue off the rotator cuff as well.  We did get into some bleeding at that point, and I did not perform a distal clavicle resection secondary to the disrupted view of the camera.  I removed all instrumentation and I closed each portal site with interrupted nylon suture.  Xeroform followed by well-padded sterile dressing was applied. His arm was placed in a sling.  He was awakened, extubated, and taken to the recovery room in stable condition.  All final counts were correct and there were no complications noted.     Vanita Panda. Magnus Ivan, M.D.     CYB/MEDQ  D:  12/18/2011  T:  12/19/2011  Job:  [id number]

## 2011-12-19 NOTE — Discharge Summary (Signed)
Patient ID: [name] MRN: [id number] DOB/AGE: January 18, 1956 56 y.o.  Admit date: 12/18/2011 Discharge date: 12/19/2011  Admission Diagnoses:  Principal Problem:  *Shoulder impingement syndrome, left Active Problems:  Carpal tunnel syndrome of left wrist   Discharge Diagnoses:  Same  Past Medical History  Diagnosis Date  . Diabetes mellitus   . Hyperlipidemia   . Myocardial infarction 02/2001  . Kidney stones   . Sleep apnea     STOP BANG SCORE 4  . Carpal tunnel syndrome     peripheral neuropathy  . CAD (coronary artery disease)     OV, Dr Doristine Bosworth, [facility] 5/12 on chart  EKG 10/12 EPIC,  chest x ray 01/07/11 EPIC  . Peripheral vascular disease, unspecified     Surgeries: Procedure(s): SHOULDER ARTHROSCOPY WITH SUBACROMIAL DECOMPRESSION CARPAL TUNNEL RELEASE on 12/18/2011   Consultants:    Discharged Condition: Improved  Hospital Course: [name] is an 56 y.o. male who was admitted 12/18/2011 for operative treatment ofShoulder impingement syndrome, left. Patient has severe unremitting pain that affects sleep, daily activities, and work/hobbies. After pre-op clearance the patient was taken to the operating room on 12/18/2011 and underwent  Procedure(s): SHOULDER ARTHROSCOPY WITH SUBACROMIAL DECOMPRESSION CARPAL TUNNEL RELEASE.    Patient was given perioperative antibiotics: Anti-infectives     Start     Dose/Rate Route Frequency Ordered Stop   12/18/11 0536   ceFAZolin (ANCEF) IVPB 2 g/50 mL premix  Status:  Discontinued        2 g 100 mL/hr over 30 Minutes Intravenous 60 min pre-op 12/18/11 0536 12/18/11 1148           Patient was given sequential compression devices, early ambulation, and chemoprophylaxis to prevent DVT.  Patient benefited maximally from hospital stay and there were no complications.    Recent vital signs: Patient Vitals for the past 24 hrs:  BP Temp Temp src Pulse Resp SpO2 Height Weight  12/19/11 0535 131/79 mmHg 98.6 F (37 C) - 83  15   100 % - -  12/19/11 0152 151/80 mmHg 98.7 F (37.1 C) Oral 99  16  99 % - -  12/19/11 0050 - - - 97  - 99 % - -  12/18/11 2154 147/78 mmHg 99 F (37.2 C) Oral 104  19  100 % - -  12/18/11 1820 122/65 mmHg 97.3 F (36.3 C) Oral 68  14  100 % - -  12/18/11 1430 126/78 mmHg 97.1 F (36.2 C) Oral 65  14  99 % - -  12/18/11 1330 125/78 mmHg 97.2 F (36.2 C) Oral 70  14  100 % - -  12/18/11 1230 108/71 mmHg 97.6 F (36.4 C) Oral 72  15  100 % - -  12/18/11 1130 133/80 mmHg 97.8 F (36.6 C) Oral 62  13  99 % 5\' 11"  (1.803 m) 113.399 kg (250 lb)  12/18/11 1127 133/80 mmHg 97.8 F (36.6 C) - 62  13  99 % - -  12/18/11 1100 116/63 mmHg 97.5 F (36.4 C) - 74  12  99 % - -  12/18/11 1045 113/58 mmHg - - 74  12  99 % - -  12/18/11 1030 128/52 mmHg - - 65  12  100 % - -  12/18/11 1015 123/55 mmHg 98.1 F (36.7 C) - 61  12  100 % - -  12/18/11 1000 128/52 mmHg - - 62  13  98 % - -  12/18/11 9563 - - - 79  15  98 % - -  12/18/11 0945 146/71 mmHg - - 64  11  100 % - -  12/18/11 0930 153/83 mmHg - - 65  9  100 % - -  12/18/11 0916 150/75 mmHg 98.2 F (36.8 C) - 72  10  100 % - -  12/18/11 0726 - - - 75  - 95 % - -  12/18/11 0722 - - - 75  - 97 % - -  12/18/11 0720 147/74 mmHg - - 73  - 98 % - -     Recent laboratory studies: No results found for this basename: WBC:2,HGB:2,HCT:2,PLT:2,NA:2,K:2,CL:2,CO2:2,BUN:2,CREATININE:2,GLUCOSE:2,PT:2,INR:2,CALCIUM,2: in the last 72 hours   Discharge Medications:   Medication List  As of 12/19/2011  6:58 AM   TAKE these medications         aspirin 325 MG tablet   Take 325 mg by mouth daily with breakfast.      clopidogrel 75 MG tablet   Commonly known as: PLAVIX   Take 75 mg by mouth daily with breakfast.      fenofibrate 145 MG tablet   Commonly known as: TRICOR   Take 145 mg by mouth daily with breakfast.      gabapentin 300 MG capsule   Commonly known as: NEURONTIN   Take 300 mg by mouth 3 (three) times daily.      glyBURIDE-metformin  2.5-500 MG per tablet   Commonly known as: GLUCOVANCE   Take 1 tablet by mouth 2 (two) times daily with a meal.      insulin glargine 100 UNIT/ML injection   Commonly known as: LANTUS   Inject 70 Units into the skin 2 (two) times daily.      lisinopril-hydrochlorothiazide 20-12.5 MG per tablet   Commonly known as: PRINZIDE,ZESTORETIC   Take 1 tablet by mouth daily with breakfast.      metoprolol tartrate 25 MG tablet   Commonly known as: LOPRESSOR   Take 25 mg by mouth daily with breakfast.      oxyCODONE-acetaminophen 5-325 MG per tablet   Commonly known as: PERCOCET   Take 1-2 tablets by mouth every 4 (four) hours as needed for pain.      oxyCODONE-acetaminophen 5-325 MG per tablet   Commonly known as: PERCOCET   Take 1-2 tablets by mouth every 4 (four) hours as needed for pain.            Diagnostic Studies: No results found.  Disposition: 01-Home or Self Care  Discharge Orders    Future Appointments: Provider: Department: Dept Phone: Center:   04/14/2012 10:00 AM [facility] [phone] [facility]   04/14/2012 10:30 AM Sherren Kerns, MD [facility] [phone] [facility]     Future Orders Please Complete By Expires   Diet - low sodium heart healthy      Diet - low sodium heart healthy      Call MD / Call 911      Comments:   If you experience chest pain or shortness of breath, CALL 911 and be transported to the hospital emergency room.  If you develope a fever above 101 F, pus (white drainage) or increased drainage or redness at the wound, or calf pain, call your surgeon's office.   Constipation Prevention      Comments:   Drink plenty of fluids.  Prune juice may be helpful.  You may use a stool softener, such as Colace (over the counter) 100 mg twice a day.  Use MiraLax (over the  counter) for constipation as needed.   Increase activity slowly as tolerated      Discharge instructions      Comments:   Increase your activities as comfort allows. Expect  swelling with your shoulder and hand.  Ice and elevation as needed. You may remove your dressings in 2 days and place daily band-aids as needed. Wait about 4 days before getting your incisions wet in the shower. Follow-up in the office in 1 week. Call [phone] with questions/concerns.   Call MD / Call 911      Comments:   If you experience chest pain or shortness of breath, CALL 911 and be transported to the hospital emergency room.  If you develope a fever above 101 F, pus (white drainage) or increased drainage or redness at the wound, or calf pain, call your surgeon's office.   Constipation Prevention      Comments:   Drink plenty of fluids.  Prune juice may be helpful.  You may use a stool softener, such as Colace (over the counter) 100 mg twice a day.  Use MiraLax (over the counter) for constipation as needed.   Increase activity slowly as tolerated      Discharge instructions      Comments:   Increase your activities as comfort allows. No heavy lifting or gripping with your left hand. Remove your left wrist dressing Sunday and place big band-aid daily. You can get your incisions wet starting Monday.   Discharge patient      Discharge patient            Signed: [name] 12/19/2011, 6:58 AM

## 2011-12-21 ENCOUNTER — Encounter (HOSPITAL_COMMUNITY): Payer: Self-pay | Admitting: Orthopaedic Surgery

## 2012-04-14 ENCOUNTER — Ambulatory Visit: Payer: BC Managed Care – PPO | Admitting: Neurosurgery

## 2012-04-25 ENCOUNTER — Encounter: Payer: Self-pay | Admitting: Neurosurgery

## 2012-04-26 ENCOUNTER — Encounter: Payer: Self-pay | Admitting: Neurosurgery

## 2012-04-26 ENCOUNTER — Encounter (INDEPENDENT_AMBULATORY_CARE_PROVIDER_SITE_OTHER): Payer: BC Managed Care – PPO | Admitting: *Deleted

## 2012-04-26 ENCOUNTER — Ambulatory Visit (INDEPENDENT_AMBULATORY_CARE_PROVIDER_SITE_OTHER): Payer: BC Managed Care – PPO | Admitting: Neurosurgery

## 2012-04-26 VITALS — BP 138/77 | HR 82 | Resp 20 | Ht 71.0 in | Wt 247.0 lb

## 2012-04-26 DIAGNOSIS — I739 Peripheral vascular disease, unspecified: Secondary | ICD-10-CM | POA: Insufficient documentation

## 2012-04-26 DIAGNOSIS — Z48812 Encounter for surgical aftercare following surgery on the circulatory system: Secondary | ICD-10-CM

## 2012-04-26 DIAGNOSIS — I70219 Atherosclerosis of native arteries of extremities with intermittent claudication, unspecified extremity: Secondary | ICD-10-CM

## 2012-04-26 NOTE — Progress Notes (Signed)
[facility] [place] [facility] Office Note  CC: Six-month PVD surveillance Referring Physician: Fields  History of Present Illness: 56 year old male patient of Dr. 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 The patient reports no worsening of his symptoms, he still has claudication left greater than right. The patient denies rest pain or ulcerations. The patient denies any other vascular problems.  Past Medical History  Diagnosis Date  . Diabetes mellitus   . Hyperlipidemia   . Myocardial infarction 02/2001  . Kidney stones   . Sleep apnea     STOP BANG SCORE 4  . Carpal tunnel syndrome     peripheral neuropathy  . CAD (coronary artery disease)     OV, Dr Doristine Bosworth, [facility] 5/12 on chart  EKG 10/12 EPIC,  chest x ray 01/07/11 EPIC  . Peripheral vascular disease, unspecified     ROS: [x]  Positive   [ ]  Denies    General: [ ]  Weight loss, [ ]  Fever, [ ]  chills Neurologic: [ ]  Dizziness, [ ]  Blackouts, [ ]  Seizure [ ]  Stroke, [ ]  "Mini stroke", [ ]  Slurred speech, [ ]  Temporary blindness; [ ]  weakness in arms or legs, [ ]  Hoarseness Cardiac: [ ]  Chest pain/pressure, [ ]  Shortness of breath at rest [ ]  Shortness of breath with exertion, [ ]  Atrial fibrillation or irregular heartbeat Vascular: [ ]  Pain in legs with walking, [ ]  Pain in legs at rest, [ ]  Pain in legs at night,  [ ]  Non-healing ulcer, [ ]  Blood clot in vein/DVT,   Pulmonary: [ ]  Home oxygen, [ ]  Productive cough, [ ]  Coughing up blood, [ ]  Asthma,  [ ]  Wheezing Musculoskeletal:  [ ]  Arthritis, [ ]  Low back pain, [ ]  Joint pain Hematologic: [ ]  Easy Bruising, [ ]  Anemia; [ ]  Hepatitis Gastrointestinal: [ ]  Blood in stool, [ ]  Gastroesophageal Reflux/heartburn, [ ]  Trouble swallowing Urinary: [ ]  chronic Kidney disease, [ ]  on HD - [ ]  MWF or [ ]  TTHS, [ ]  Burning with urination, [ ]  Difficulty urinating Skin: [ ]  Rashes, [ ]   Wounds Psychological: [ ]  Anxiety, [ ]  Depression   Social History History  Substance Use Topics  . Smoking status: Never Smoker   . Smokeless tobacco: Never Used  . Alcohol Use: No    Family History Family History  Problem Relation Age of Onset  . Cancer Mother   . Cancer Father     No Known Allergies  Current Outpatient Prescriptions  Medication Sig Dispense Refill  . aspirin 325 MG tablet Take 325 mg by mouth daily with breakfast.       . clopidogrel (PLAVIX) 75 MG tablet Take 75 mg by mouth daily with breakfast.       . fenofibrate (TRICOR) 145 MG tablet Take 145 mg by mouth daily with breakfast.       . gabapentin (NEURONTIN) 300 MG capsule Take 300 mg by mouth 3 (three) times daily.       [name] glyBURIDE-metformin (GLUCOVANCE) 2.5-500 MG per tablet Take 1 tablet by mouth 2 (two) times daily with a meal.       . insulin glargine (LANTUS) 100 UNIT/ML injection Inject 70 Units into the skin 2 (two) times daily.       [name] lisinopril-hydrochlorothiazide (PRINZIDE,ZESTORETIC) 20-12.5 MG per tablet Take 1 tablet by mouth daily with breakfast.       . metoprolol  tartrate (LOPRESSOR) 25 MG tablet Take 25 mg by mouth daily with breakfast.         Physical Examination  Filed Vitals:   04/26/12 1610  BP: 138/77  Pulse: 82  Resp: 20    Body mass index is 34.45 kg/(m^2).  General:  WDWN in NAD Gait: Normal HEENT: WNL Eyes: Pupils equal Pulmonary: normal non-labored breathing , without Rales, rhonchi,  wheezing Cardiac: RRR, without  Murmurs, rubs or gallops; No carotid bruits Abdomen: soft, NT, no masses Skin: no rashes, ulcers noted Vascular Exam/Pulses: Palpable PT and DP pulses on the right no palpable lower extremity pulses on the left but he is well perfused  Extremities without ischemic changes, no Gangrene , no cellulitis; no open wounds;  Musculoskeletal: no muscle wasting or atrophy  Neurologic: A&O X 3; Appropriate Affect ; SENSATION: normal; MOTOR FUNCTION:   moving all extremities equally. Speech is fluent/normal  Non-Invasive Vascular Imaging: ABIs today are 1.03 and triphasic on the right, 0.66 and monophasic on the left which is consistent with previous exam in March 2013  ASSESSMENT/PLAN: Assessment as above, the patient will followup here in 6 months with repeat ABIs. The patient will be seen in my clinic when Dr. Darrick Penna is in the clinic. The patient's questions were encouraged and answered, he is in agreement with this plan.  Lauree Chandler ANP  Clinic M.D.: [facility]

## 2012-04-27 NOTE — Addendum Note (Signed)
Addended by: Sharee Pimple on: 04/27/2012 10:24 AM   Modules accepted: Orders

## 2012-06-21 ENCOUNTER — Other Ambulatory Visit (HOSPITAL_COMMUNITY): Payer: Self-pay | Admitting: Orthopaedic Surgery

## 2012-07-01 ENCOUNTER — Encounter (HOSPITAL_COMMUNITY): Payer: Self-pay | Admitting: Pharmacy Technician

## 2012-07-05 ENCOUNTER — Encounter (HOSPITAL_COMMUNITY)
Admission: RE | Admit: 2012-07-05 | Discharge: 2012-07-05 | Disposition: A | Discharge status: Home / Self Care | Payer: BC Managed Care – PPO | Source: Ambulatory Visit | Attending: Orthopaedic Surgery | Admitting: Orthopaedic Surgery

## 2012-07-05 ENCOUNTER — Encounter (HOSPITAL_COMMUNITY): Payer: Self-pay

## 2012-07-05 ENCOUNTER — Ambulatory Visit (HOSPITAL_COMMUNITY)
Admission: RE | Admit: 2012-07-05 | Discharge: 2012-07-05 | Disposition: A | Discharge status: Home / Self Care | Payer: BC Managed Care – PPO | Source: Ambulatory Visit | Attending: Orthopaedic Surgery | Admitting: Orthopaedic Surgery

## 2012-07-05 DIAGNOSIS — Z01818 Encounter for other preprocedural examination: Secondary | ICD-10-CM | POA: Insufficient documentation

## 2012-07-05 DIAGNOSIS — Z01812 Encounter for preprocedural laboratory examination: Secondary | ICD-10-CM | POA: Insufficient documentation

## 2012-07-05 DIAGNOSIS — I9589 Other hypotension: Secondary | ICD-10-CM | POA: Insufficient documentation

## 2012-07-05 HISTORY — DX: Polyneuropathy, unspecified: G62.9

## 2012-07-05 HISTORY — DX: Unspecified osteoarthritis, unspecified site: M19.90

## 2012-07-05 HISTORY — DX: Essential (primary) hypertension: I10

## 2012-07-05 LAB — CBC
HCT: 41.4 % (ref 39.0–52.0)
MCHC: 33.8 g/dL (ref 30.0–36.0)
MCV: 83.8 fL (ref 78.0–100.0)
RDW: 12.8 % (ref 11.5–15.5)

## 2012-07-05 LAB — BASIC METABOLIC PANEL
BUN: 19 mg/dL (ref 6–23)
Chloride: 95 mEq/L — ABNORMAL LOW (ref 96–112)
Creatinine, Ser: 0.92 mg/dL (ref 0.50–1.35)
GFR calc Af Amer: >90 mL/min (ref >90)
GFR calc non Af Amer: >90 mL/min (ref >90)

## 2012-07-05 NOTE — Pre-Procedure Instructions (Signed)
PREOP CBC, BMET, CXR WERE DONE TODAY AT [facility] AS PER ANESTHESIOLOGIST'S GUIDELINES.  PT HAS EKG REPORT AND CARDIOLOGY OFFICE NOTE ON CHART FROM DR. BERRY FROM 05/04/12.

## 2012-07-05 NOTE — Patient Instructions (Signed)
YOUR SURGERY IS SCHEDULED AT [facility]  ON:  Friday  12/20  REPORT TO [facility] AT:  12:30 PM      PHONE # FOR SHORT STAY IS [phone]  STOP ASPIRIN AND PLAVIX AS OF TODAY --Tuesday 07/05/12 - PER DR. C. BLACKMAN  YOU MAY HAVE LIGHT BREAKFAST OF TOAST AND CEREAL WITH MILK - BEFORE 6:30 AM DAY OF SURGERY.   YOU ARE NOT TO EAT ANYTHING AFTER 6:30 AM. YOU MAY HAVE CLEAR LIQUIDS TO DRINK FROM MIDNIGHT UNTIL 4 HOURS BEFORE SURGERY -UNTIL 10:30 AM DAY OF SURGERY -WATER AND TEA.  NOTHING TO DRINK AFTER 10:30 AM DAY OF SURGERY.  PLEASE TAKE THE FOLLOWING MEDICATIONS THE AM OF YOUR SURGERY WITH A FEW SIPS OF WATER:  GABAPENTIN AND METOPROLOL.  IF YOU USE INHALERS--USE YOUR INHALERS THE AM OF YOUR SURGERY AND BRING INHALERS TO THE HOSPITAL -TAKE TO SURGERY.    IF YOU ARE DIABETIC:  DO NOT TAKE ANY DIABETIC MEDICATIONS THE AM OF YOUR SURGERY.  IF YOU TAKE INSULIN IN THE EVENINGS--PLEASE ONLY TAKE 1/2 NORMAL EVENING DOSE THE NIGHT BEFORE YOUR SURGERY.  NO INSULIN THE AM OF YOUR SURGERY.  IF YOU HAVE SLEEP APNEA AND USE CPAP OR BIPAP--PLEASE BRING THE MASK AND THE TUBING.  DO NOT BRING YOUR MACHINE.  DO NOT BRING VALUABLES, MONEY, CREDIT CARDS.  DO NOT WEAR JEWELRY, MAKE-UP, NAIL POLISH AND NO METAL PINS OR CLIPS IN YOUR HAIR. CONTACT LENS, DENTURES / PARTIALS, GLASSES SHOULD NOT BE WORN TO SURGERY AND IN MOST CASES-HEARING AIDS WILL NEED TO BE REMOVED.  BRING YOUR GLASSES CASE, ANY EQUIPMENT NEEDED FOR YOUR CONTACT LENS. FOR PATIENTS ADMITTED TO THE HOSPITAL--CHECK OUT TIME THE DAY OF DISCHARGE IS 11:00 AM.  ALL INPATIENT ROOMS ARE PRIVATE - WITH BATHROOM, TELEPHONE, TELEVISION AND WIFI INTERNET.  IF YOU ARE BEING DISCHARGED THE SAME DAY OF YOUR SURGERY--YOU CAN NOT DRIVE YOURSELF HOME--AND SHOULD NOT GO HOME ALONE BY TAXI OR BUS.  NO DRIVING OR OPERATING MACHINERY FOR 24 HOURS FOLLOWING ANESTHESIA / PAIN MEDICATIONS.  PLEASE MAKE ARRANGEMENTS FOR SOMEONE TO BE WITH YOU  AT HOME THE FIRST 24 HOURS AFTER SURGERY. RESPONSIBLE DRIVER'S NAME___________________________                                               PHONE #   _______________________                               PLEASE READ OVER ANY  FACT SHEETS THAT YOU WERE GIVEN: MRSA INFORMATION, BLOOD TRANSFUSION INFORMATION, INCENTIVE SPIROMETER INFORMATION. FAILURE TO FOLLOW THESE INSTRUCTIONS MAY RESULT IN THE CANCELLATION OF YOUR SURGERY.   PATIENT SIGNATURE_________________________________

## 2012-07-05 NOTE — Pre-Procedure Instructions (Signed)
SHERRY-DR. Alben Spittle SURGERY SCHEDULER NOTIFIED PT TAKING PLAVIX AND ASPIRIN -HAS CAD AND PERIPHERAL VASCULAR DISEASE--DOES DR. BLACKMAN WANT PT TO CONTINUE PLAVIX AND ASPIRIN-PT STATES HE WAS NOT GIVEN ANY INSTRUCTIONS ABOUT STOPPING--HE LAST TOOK PLAVIX AND ASPIRIN YESTERDAY 07/04/12.  SHERRY ASKED DR. [facility] AND DR. BLACKMAN DOES WANT PT TO STOP PLAVIX AND ASPIRIN AS OF TODAY.  PT INSTRUCTED. DR. Council Mechanic NOTIFIED OF PT'S SURGERY SCHEDULED FOR 07/05/12 AT 2:30 PM AND PT STATES DR. C. BLACKMAN TOLD HIM HE COULD HAVE A LIGHT BREAKFAST DAY OF SURGERY--PT STATES HE IS DIABETIC AND CAN'T GO FROM MIDNIGHT UNTIL 2:30PM WITHOUT EATING.  DR. Council Mechanic STATES TO INSTRUCT PT LIGHT BREAKFAST OF TOAST AND CEREAL WITH MILK BEFORE 6:30 AM DAY OF SURGERY-NOTHING TO EAT AFTER 6:30 AM AND PT MAY HAVE CLEAR LIQUIDS TO DRINK UNTIL 4 HOURS BEFORE SURGERY ( 10:30 AM)  - WATER, TEA AND THEN NOTHING TO DRINK AFTER 10:30 AM.  PT INSTRUCTED.

## 2012-07-06 NOTE — Pre-Procedure Instructions (Signed)
Pt's preop bmet report faxed to Dr. Alben Spittle office and Cordelia Pen at the office (surgery scheduler) notified of fax-pt's blood sugar [phone]--please ask Dr. Magnus Ivan to review in epic.

## 2012-07-08 ENCOUNTER — Ambulatory Visit (HOSPITAL_COMMUNITY)
Admission: RE | Admit: 2012-07-08 | Discharge: 2012-07-09 | Disposition: A | Discharge status: Home / Self Care | Payer: BC Managed Care – PPO | Source: Ambulatory Visit | Attending: Orthopaedic Surgery | Admitting: Orthopaedic Surgery

## 2012-07-08 ENCOUNTER — Ambulatory Visit (HOSPITAL_COMMUNITY): Payer: BC Managed Care – PPO | Admitting: Anesthesiology

## 2012-07-08 ENCOUNTER — Encounter (HOSPITAL_COMMUNITY): Payer: Self-pay | Admitting: Anesthesiology

## 2012-07-08 ENCOUNTER — Encounter (HOSPITAL_COMMUNITY): Payer: Self-pay | Admitting: *Deleted

## 2012-07-08 ENCOUNTER — Encounter (HOSPITAL_COMMUNITY)
Admission: RE | Disposition: A | Discharge status: Home / Self Care | Payer: Self-pay | Source: Ambulatory Visit | Attending: Orthopaedic Surgery

## 2012-07-08 DIAGNOSIS — M25619 Stiffness of unspecified shoulder, not elsewhere classified: Secondary | ICD-10-CM | POA: Insufficient documentation

## 2012-07-08 DIAGNOSIS — I739 Peripheral vascular disease, unspecified: Secondary | ICD-10-CM | POA: Insufficient documentation

## 2012-07-08 DIAGNOSIS — M25519 Pain in unspecified shoulder: Secondary | ICD-10-CM | POA: Insufficient documentation

## 2012-07-08 DIAGNOSIS — E785 Hyperlipidemia, unspecified: Secondary | ICD-10-CM | POA: Insufficient documentation

## 2012-07-08 DIAGNOSIS — I252 Old myocardial infarction: Secondary | ICD-10-CM | POA: Insufficient documentation

## 2012-07-08 DIAGNOSIS — E119 Type 2 diabetes mellitus without complications: Secondary | ICD-10-CM | POA: Insufficient documentation

## 2012-07-08 DIAGNOSIS — M7500 Adhesive capsulitis of unspecified shoulder: Secondary | ICD-10-CM | POA: Insufficient documentation

## 2012-07-08 DIAGNOSIS — I10 Essential (primary) hypertension: Secondary | ICD-10-CM | POA: Insufficient documentation

## 2012-07-08 DIAGNOSIS — I2510 Atherosclerotic heart disease of native coronary artery without angina pectoris: Secondary | ICD-10-CM | POA: Insufficient documentation

## 2012-07-08 DIAGNOSIS — Z951 Presence of aortocoronary bypass graft: Secondary | ICD-10-CM | POA: Insufficient documentation

## 2012-07-08 HISTORY — PX: SHOULDER ARTHROSCOPY: SHX128

## 2012-07-08 LAB — GLUCOSE, CAPILLARY
Glucose-Capillary: 290 mg/dL — ABNORMAL HIGH (ref 70–99)
Glucose-Capillary: 231 mg/dL — ABNORMAL HIGH (ref 70–99)
Glucose-Capillary: 210 mg/dL — ABNORMAL HIGH (ref 70–99)

## 2012-07-08 SURGERY — ARTHROSCOPY, SHOULDER
Anesthesia: General | Site: Shoulder | Laterality: Left | Wound class: Clean

## 2012-07-08 MED ORDER — INSULIN ASPART 100 UNIT/ML ~~LOC~~ SOLN
0.0000 [IU] | Freq: Three times a day (TID) | SUBCUTANEOUS | Status: DC
  Administered 2012-07-08: 7 [IU] via SUBCUTANEOUS
  Administered 2012-07-09: 11 [IU] via SUBCUTANEOUS

## 2012-07-08 MED ORDER — LIDOCAINE HCL (CARDIAC) 20 MG/ML IV SOLN
INTRAVENOUS | Status: DC | PRN
  Administered 2012-07-08: 80 mg via INTRAVENOUS

## 2012-07-08 MED ORDER — LACTATED RINGERS IR SOLN
Status: DC | PRN
  Administered 2012-07-08: 6000 mL

## 2012-07-08 MED ORDER — HYDROMORPHONE HCL PF 1 MG/ML IJ SOLN
1.0000 mg | INTRAMUSCULAR | Status: DC | PRN
  Administered 2012-07-09: 1 mg via INTRAVENOUS
  Filled 2012-07-08: qty 1

## 2012-07-08 MED ORDER — ROPIVACAINE HCL 5 MG/ML IJ SOLN
INTRAMUSCULAR | Status: DC | PRN
  Administered 2012-07-08: 25 mL

## 2012-07-08 MED ORDER — EPINEPHRINE HCL 1 MG/ML IJ SOLN
INTRAMUSCULAR | Status: DC | PRN
  Administered 2012-07-08: 2 mg

## 2012-07-08 MED ORDER — METFORMIN HCL 500 MG PO TABS
500.0000 mg | ORAL_TABLET | Freq: Two times a day (BID) | ORAL | Status: DC
  Administered 2012-07-09: 500 mg via ORAL
  Filled 2012-07-08 (×3): qty 1

## 2012-07-08 MED ORDER — DIPHENHYDRAMINE HCL 12.5 MG/5ML PO ELIX: 12.5000 mg | ORAL_SOLUTION | ORAL | Status: DC | PRN

## 2012-07-08 MED ORDER — ONDANSETRON HCL 4 MG/2ML IJ SOLN
INTRAMUSCULAR | Status: DC | PRN
  Administered 2012-07-08: 4 mg via INTRAVENOUS

## 2012-07-08 MED ORDER — FENTANYL CITRATE 0.05 MG/ML IJ SOLN
50.0000 ug | Freq: Once | INTRAMUSCULAR | Status: AC
  Administered 2012-07-08: 100 ug via INTRAVENOUS

## 2012-07-08 MED ORDER — FENOFIBRATE 160 MG PO TABS
160.0000 mg | ORAL_TABLET | Freq: Every day | ORAL | Status: DC
  Administered 2012-07-08 – 2012-07-09 (×2): 160 mg via ORAL
  Filled 2012-07-08 (×2): qty 1

## 2012-07-08 MED ORDER — FENTANYL CITRATE 0.05 MG/ML IJ SOLN
INTRAMUSCULAR | Status: AC
  Filled 2012-07-08: qty 2

## 2012-07-08 MED ORDER — CLOPIDOGREL BISULFATE 75 MG PO TABS
75.0000 mg | ORAL_TABLET | Freq: Every day | ORAL | Status: DC
  Administered 2012-07-09: 75 mg via ORAL
  Filled 2012-07-08 (×2): qty 1

## 2012-07-08 MED ORDER — MIDAZOLAM HCL 2 MG/2ML IJ SOLN
INTRAMUSCULAR | Status: AC
  Filled 2012-07-08: qty 2

## 2012-07-08 MED ORDER — ONDANSETRON HCL 4 MG/2ML IJ SOLN: 4.0000 mg | Freq: Four times a day (QID) | INTRAMUSCULAR | Status: DC | PRN

## 2012-07-08 MED ORDER — HYDROCHLOROTHIAZIDE 12.5 MG PO CAPS
12.5000 mg | ORAL_CAPSULE | Freq: Every day | ORAL | Status: DC
  Administered 2012-07-09: 12.5 mg via ORAL
  Filled 2012-07-08: qty 1

## 2012-07-08 MED ORDER — PROPOFOL 10 MG/ML IV BOLUS
INTRAVENOUS | Status: DC | PRN
  Administered 2012-07-08: 200 mg via INTRAVENOUS

## 2012-07-08 MED ORDER — LACTATED RINGERS IV SOLN
INTRAVENOUS | Status: DC | PRN
  Administered 2012-07-08: 14:00:00 via INTRAVENOUS

## 2012-07-08 MED ORDER — BUPIVACAINE HCL (PF) 0.25 % IJ SOLN
INTRAMUSCULAR | Status: AC
  Filled 2012-07-08: qty 30

## 2012-07-08 MED ORDER — KETOROLAC TROMETHAMINE 15 MG/ML IJ SOLN
15.0000 mg | Freq: Three times a day (TID) | INTRAMUSCULAR | Status: DC | PRN
Start: 2012-07-08 — End: 2012-07-09

## 2012-07-08 MED ORDER — HYDROCODONE-ACETAMINOPHEN 5-325 MG PO TABS
1.0000 | ORAL_TABLET | ORAL | Status: DC | PRN
  Administered 2012-07-08: 1 via ORAL
  Administered 2012-07-09 (×3): 2 via ORAL
  Filled 2012-07-08 (×2): qty 2
  Filled 2012-07-08: qty 1
  Filled 2012-07-08: qty 2

## 2012-07-08 MED ORDER — LISINOPRIL-HYDROCHLOROTHIAZIDE 20-12.5 MG PO TABS: 1.0000 | ORAL_TABLET | Freq: Every day | ORAL | Status: DC

## 2012-07-08 MED ORDER — SODIUM CHLORIDE 0.9 % IV SOLN
INTRAVENOUS | Status: DC
  Administered 2012-07-08: 20 mL/h via INTRAVENOUS

## 2012-07-08 MED ORDER — GLYBURIDE-METFORMIN 2.5-500 MG PO TABS: 1.0000 | ORAL_TABLET | Freq: Two times a day (BID) | ORAL | Status: DC

## 2012-07-08 MED ORDER — GABAPENTIN 300 MG PO CAPS
300.0000 mg | ORAL_CAPSULE | Freq: Three times a day (TID) | ORAL | Status: DC
  Administered 2012-07-08 – 2012-07-09 (×2): 300 mg via ORAL
  Filled 2012-07-08 (×4): qty 1

## 2012-07-08 MED ORDER — BUPIVACAINE-EPINEPHRINE PF 0.25-1:200000 % IJ SOLN
INTRAMUSCULAR | Status: AC
  Filled 2012-07-08: qty 30

## 2012-07-08 MED ORDER — METHOCARBAMOL 500 MG PO TABS
500.0000 mg | ORAL_TABLET | Freq: Four times a day (QID) | ORAL | Status: DC | PRN
  Administered 2012-07-08 – 2012-07-09 (×2): 500 mg via ORAL
  Filled 2012-07-08 (×2): qty 1

## 2012-07-08 MED ORDER — EPINEPHRINE HCL 1 MG/ML IJ SOLN
INTRAMUSCULAR | Status: AC
  Filled 2012-07-08: qty 2

## 2012-07-08 MED ORDER — INSULIN ASPART 100 UNIT/ML ~~LOC~~ SOLN
10.0000 [IU] | Freq: Once | SUBCUTANEOUS | Status: AC
  Administered 2012-07-08: 10 [IU] via SUBCUTANEOUS
  Filled 2012-07-08: qty 1

## 2012-07-08 MED ORDER — ONDANSETRON HCL 4 MG PO TABS: 4.0000 mg | ORAL_TABLET | Freq: Four times a day (QID) | ORAL | Status: DC | PRN

## 2012-07-08 MED ORDER — METOCLOPRAMIDE HCL 5 MG/ML IJ SOLN
5.0000 mg | Freq: Three times a day (TID) | INTRAMUSCULAR | Status: DC | PRN
Start: 2012-07-08 — End: 2012-07-09

## 2012-07-08 MED ORDER — METHOCARBAMOL 100 MG/ML IJ SOLN
500.0000 mg | Freq: Four times a day (QID) | INTRAVENOUS | Status: DC | PRN
  Filled 2012-07-08: qty 5

## 2012-07-08 MED ORDER — GLYBURIDE 2.5 MG PO TABS
2.5000 mg | ORAL_TABLET | Freq: Two times a day (BID) | ORAL | Status: DC
  Administered 2012-07-09: 2.5 mg via ORAL
  Filled 2012-07-08 (×3): qty 1

## 2012-07-08 MED ORDER — SUCCINYLCHOLINE CHLORIDE 20 MG/ML IJ SOLN
INTRAMUSCULAR | Status: DC | PRN
  Administered 2012-07-08: 140 mg via INTRAVENOUS

## 2012-07-08 MED ORDER — CEFAZOLIN SODIUM-DEXTROSE 2-3 GM-% IV SOLR
2.0000 g | INTRAVENOUS | Status: AC
  Administered 2012-07-08: 2 g via INTRAVENOUS

## 2012-07-08 MED ORDER — METOCLOPRAMIDE HCL 10 MG PO TABS: 5.0000 mg | ORAL_TABLET | Freq: Three times a day (TID) | ORAL | Status: DC | PRN

## 2012-07-08 MED ORDER — METOPROLOL TARTRATE 25 MG PO TABS
25.0000 mg | ORAL_TABLET | Freq: Every day | ORAL | Status: DC
  Administered 2012-07-09: 25 mg via ORAL
  Filled 2012-07-08 (×2): qty 1

## 2012-07-08 MED ORDER — OXYCODONE HCL 5 MG PO TABS: 5.0000 mg | ORAL_TABLET | ORAL | Status: DC | PRN

## 2012-07-08 MED ORDER — BUPIVACAINE-EPINEPHRINE PF 0.25-1:200000 % IJ SOLN
INTRAMUSCULAR | Status: DC | PRN
  Administered 2012-07-08: 30 mL

## 2012-07-08 MED ORDER — INSULIN ASPART 100 UNIT/ML ~~LOC~~ SOLN
0.0000 [IU] | Freq: Every day | SUBCUTANEOUS | Status: DC
  Administered 2012-07-08: 2 [IU] via SUBCUTANEOUS

## 2012-07-08 MED ORDER — MIDAZOLAM HCL 2 MG/2ML IJ SOLN
1.0000 mg | INTRAMUSCULAR | Status: DC | PRN
  Administered 2012-07-08: 2 mg via INTRAVENOUS

## 2012-07-08 MED ORDER — FENTANYL CITRATE 0.05 MG/ML IJ SOLN
INTRAMUSCULAR | Status: DC | PRN
  Administered 2012-07-08: 50 ug via INTRAVENOUS

## 2012-07-08 MED ORDER — INSULIN GLARGINE 100 UNIT/ML ~~LOC~~ SOLN
70.0000 [IU] | Freq: Two times a day (BID) | SUBCUTANEOUS | Status: DC
  Administered 2012-07-08 – 2012-07-09 (×2): 70 [IU] via SUBCUTANEOUS

## 2012-07-08 MED ORDER — MORPHINE SULFATE 2 MG/ML IJ SOLN: 1.0000 mg | INTRAMUSCULAR | Status: DC | PRN

## 2012-07-08 MED ORDER — ASPIRIN 325 MG PO TABS
325.0000 mg | ORAL_TABLET | Freq: Every day | ORAL | Status: DC
  Administered 2012-07-09: 325 mg via ORAL
  Filled 2012-07-08 (×2): qty 1

## 2012-07-08 MED ORDER — ZOLPIDEM TARTRATE 5 MG PO TABS: 5.0000 mg | ORAL_TABLET | Freq: Every evening | ORAL | Status: DC | PRN

## 2012-07-08 MED ORDER — ROPIVACAINE HCL 5 MG/ML IJ SOLN
INTRAMUSCULAR | Status: AC
  Filled 2012-07-08: qty 30

## 2012-07-08 MED ORDER — LISINOPRIL 20 MG PO TABS
20.0000 mg | ORAL_TABLET | Freq: Every day | ORAL | Status: DC
  Administered 2012-07-09: 20 mg via ORAL
  Filled 2012-07-08: qty 1

## 2012-07-08 MED ORDER — CEFAZOLIN SODIUM-DEXTROSE 2-3 GM-% IV SOLR
INTRAVENOUS | Status: AC
  Filled 2012-07-08: qty 50

## 2012-07-08 SURGICAL SUPPLY — 42 items
BAG ZIPLOCK 12X15 (MISCELLANEOUS) ×2 IMPLANT
BLADE 4.2CUDA (BLADE) ×2 IMPLANT
BLADE SURG SZ11 CARB STEEL (BLADE) ×2 IMPLANT
BOOTIES KNEE HIGH SLOAN (MISCELLANEOUS) IMPLANT
BUR OVAL 4.0 (BURR) ×2 IMPLANT
BUR VERTEX HOODED 4.5 (BURR) ×2 IMPLANT
CANNULA SHOULDER 7CM (CANNULA) IMPLANT
CANNULA TWIST IN 8.25X7CM (CANNULA) IMPLANT
CLOTH BEACON ORANGE TIMEOUT ST (SAFETY) ×2 IMPLANT
COVER SURGICAL LIGHT HANDLE (MISCELLANEOUS) ×2 IMPLANT
DECANTER SPIKE VIAL GLASS SM (MISCELLANEOUS) ×2 IMPLANT
DRAPE SHOULDER BEACH CHAIR (DRAPES) ×2 IMPLANT
DRAPE U-SHAPE 47X51 STRL (DRAPES) ×2 IMPLANT
DRSG PAD ABDOMINAL 8X10 ST (GAUZE/BANDAGES/DRESSINGS) ×2 IMPLANT
ELECT REM PT RETURN 9FT ADLT (ELECTROSURGICAL) ×2
ELECTRODE REM PT RTRN 9FT ADLT (ELECTROSURGICAL) ×1 IMPLANT
GAUZE XEROFORM 1X8 LF (GAUZE/BANDAGES/DRESSINGS) ×2 IMPLANT
GLOVE BIO SURGEON STRL SZ7.5 (GLOVE) ×2 IMPLANT
GLOVE ORTHO TXT STRL SZ7.5 (GLOVE) ×2 IMPLANT
GOWN STRL REIN XL XLG (GOWN DISPOSABLE) ×4 IMPLANT
IV LACTATED RINGER IRRG 3000ML (IV SOLUTION) ×6
IV LR IRRIG 3000ML ARTHROMATIC (IV SOLUTION) ×6 IMPLANT
KIT BASIN OR (CUSTOM PROCEDURE TRAY) ×2 IMPLANT
KIT SHOULDER TRACTION (DRAPES) ×2 IMPLANT
MANIFOLD NEPTUNE II (INSTRUMENTS) ×2 IMPLANT
NDL SAFETY ECLIPSE 18X1.5 (NEEDLE) ×2 IMPLANT
NEEDLE HYPO 18GX1.5 SHARP (NEEDLE) ×2
NEEDLE HYPO 22GX1.5 SAFETY (NEEDLE) ×2 IMPLANT
NEEDLE SCORPION (NEEDLE) IMPLANT
NEEDLE SPNL 18GX3.5 QUINCKE PK (NEEDLE) ×2 IMPLANT
PACK SHOULDER CUSTOM OPM052 (CUSTOM PROCEDURE TRAY) ×2 IMPLANT
POSITIONER SURGICAL ARM (MISCELLANEOUS) ×4 IMPLANT
SET ARTHROSCOPY TUBING (MISCELLANEOUS) ×1
SET ARTHROSCOPY TUBING LN (MISCELLANEOUS) ×1 IMPLANT
SLING ARM IMMOBILIZER LRG (SOFTGOODS) ×2 IMPLANT
SLING ARM IMMOBILIZER MED (SOFTGOODS) IMPLANT
SPONGE GAUZE 4X4 12PLY (GAUZE/BANDAGES/DRESSINGS) ×2 IMPLANT
SUT ETHILON 3 0 PS 1 (SUTURE) ×2 IMPLANT
SYR 3ML LL SCALE MARK (SYRINGE) ×4 IMPLANT
TAPE CLOTH SURG 6X10 WHT LF (GAUZE/BANDAGES/DRESSINGS) ×2 IMPLANT
TOWEL OR 17X26 10 PK STRL BLUE (TOWEL DISPOSABLE) ×4 IMPLANT
WAND 90 DEG TURBOVAC W/CORD (SURGICAL WAND) ×2 IMPLANT

## 2012-07-08 NOTE — Progress Notes (Signed)
Patient taken to OR prior to time for glucose re-check. Left note on chart and notified Sandy in OR to check glucose.

## 2012-07-08 NOTE — Addendum Note (Signed)
Addendum  created 07/08/12 1702 by Thornell Mule, CRNA   Modules edited:Charges VN

## 2012-07-08 NOTE — Anesthesia Procedure Notes (Signed)
Anesthesia Regional Block:  Interscalene brachial plexus block  Pre-Anesthetic Checklist: ,, timeout performed, Correct Patient, Correct Site, Correct Laterality, Correct Procedure, Correct Position, site marked, Risks and benefits discussed,  Surgical consent,  Pre-op evaluation,  At surgeon's request and post-op pain management   Prep: chloraprep       Needles:  Injection technique: Single-shot  Needle Type: Echogenic Needle     Needle Length:cm 9 cm Needle Gauge: 21 G    Additional Needles:  Procedures: ultrasound guided (picture in chart) Interscalene brachial plexus block Narrative:  Injection made incrementally with aspirations every 5 mL.  Performed by: Personally  Anesthesiologist: Eilene Ghazi MD  Additional Notes: Patient tolerated the procedure well without complications  Interscalene brachial plexus block

## 2012-07-08 NOTE — Transfer of Care (Signed)
Immediate Anesthesia Transfer of Care Note  Patient: [name]  Procedure(s) Performed: Procedure(s) (LRB) with comments: ARTHROSCOPY SHOULDER (Left) - Left Shoulder Arthroscopy with Manipulation and Extensive Debridement  Patient Location: PACU  Anesthesia Type:General  Level of Consciousness: awake, alert , oriented, patient cooperative and responds to stimulation  Airway & Oxygen Therapy: Patient Spontanous Breathing and Patient connected to face mask oxygen  Post-op Assessment: Report given to PACU RN, Post -op Vital signs reviewed and stable and Patient moving all extremities X 4  Post vital signs: Reviewed and stable  Complications: No apparent anesthesia complications

## 2012-07-08 NOTE — H&P (Signed)
[name] is an 56 y.o. male.   Chief Complaint:   Severe left shoulder pain and limited motion HPI:   56 yo diabetic male who underwent a left shoulder scope in May of this year.  He has slowly developed worsening pain and clinical signs of a frozen shoulder.  He has failed multiple injections, PT, NSAID, rest and time.  He wishes to proceed with a repeat arthroscopy.  Past Medical History  Diagnosis Date  . Diabetes mellitus   . Hyperlipidemia   . Myocardial infarction 02/2001  . CAD (coronary artery disease)     OV, Dr Doristine Bosworth, [facility] 5/12 on chart  EKG 10/12 EP[facility],  chest x ray 01/07/11 EP[facility]  . Peripheral vascular disease, unspecified   . Hypertension   . Arthritis   . Pain     LEFT SHOULDER - PREVIOUS LEFT SHOULDER SURGERY MAY 2013  . Neuropathy, peripheral     Past Surgical History  Procedure Date  . Cholecystectomy   . Appendectomy   . Knee surgery       arthroscopy  left x 2  . Carpal tunnel release     right  . Shoulder surgery     right arthroscopy  . Femoral-tibial bypass graft 01/07/11    Left fem-posterior tibial BPG using reversed left GSV               12/15/11 OK BY DR Magnus Ivan TO CONTINUE ASA AND PLAVIX  . Coronary artery bypass graft 2002  . Cardiac catheterization 2006  . Fracture surgery     left  radius/ulna  . Popiteal artery stent 2011    x 4    LEFT/ s/p thrombectomy  . Carpal tunnel release 12/18/2011    Procedure: CARPAL TUNNEL RELEASE;  Surgeon: [name], MD;  Location: [facility];  Service: Orthopedics;  Laterality: Left;  Left Open Carpal Tunnel Release  . Left shoulder surgery  12/18/2011    Family History  Problem Relation Age of Onset  . Cancer Mother   . Cancer Father    Social History:  reports that he has never smoked. He has never used smokeless tobacco. He reports that he does not drink alcohol or use illicit drugs.  Allergies: No Known Allergies  No prescriptions prior to admission    No results found for this or  any previous visit (from the past 48 hour(s)). No results found.  Review of Systems  All other systems reviewed and are negative.    There were no vitals taken for this visit. Physical Exam  Constitutional: He is oriented to person, place, and time. He appears well-developed and well-nourished.  HENT:  Head: Normocephalic and atraumatic.  Eyes: EOM are normal. Pupils are equal, round, and reactive to light.  Neck: Normal range of motion. Neck supple.  Cardiovascular: Normal rate and regular rhythm.   Respiratory: Effort normal and breath sounds normal.  GI: Bowel sounds are normal.  Musculoskeletal:       Left shoulder: He exhibits decreased range of motion, tenderness, bony tenderness and decreased strength.  Neurological: He is alert and oriented to person, place, and time.  Skin: Skin is warm and dry.  Psychiatric: He has a normal mood and affect.     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  Puja Caffey Y 07/08/2012, 11:50 AM

## 2012-07-08 NOTE — Brief Op Note (Signed)
07/08/2012  4:19 PM  PATIENT:  [name]  56 y.o. male  PRE-OPERATIVE DIAGNOSIS:  Left shoulder bursitis and frozen shoulder  POST-OPERATIVE DIAGNOSIS:  Left shoulder bursitis and frozen shoulder  PROCEDURE:  Procedure(s) (LRB) with comments: ARTHROSCOPY SHOULDER (Left) - Left Shoulder Arthroscopy with Manipulation and Extensive Debridement  SURGEON:  Surgeon(s) and Role:    * [name], MD - Primary  PHYSICIAN ASSISTANT:   ASSISTANTS: none   ANESTHESIA:   regional and general  EBL:     BLOOD ADMINISTERED:none  DRAINS: none   LOCAL MEDICATIONS USED:  NONE  SPECIMEN:  No Specimen  DISPOSITION OF SPECIMEN:  N/A  COUNTS:  YES  TOURNIQUET:  * No tourniquets in log *  DICTATION: .Other Dictation: Dictation Number [id number]  PLAN OF CARE: Admit for overnight observation  PATIENT DISPOSITION:  PACU - hemodynamically stable.   Delay start of Pharmacological VTE agent (>24hrs) due to surgical blood loss or risk of bleeding: no

## 2012-07-08 NOTE — Anesthesia Postprocedure Evaluation (Signed)
  Anesthesia Post-op Note  Patient: [name]  Procedure(s) Performed: Procedure(s) (LRB): ARTHROSCOPY SHOULDER (Left)  Patient Location: PACU  Anesthesia Type: GA combined with regional for post-op pain  Level of Consciousness: awake and alert   Airway and Oxygen Therapy: Patient Spontanous Breathing  Post-op Pain: mild  Post-op Assessment: Post-op Vital signs reviewed, Patient's Cardiovascular Status Stable, Respiratory Function Stable, Patent Airway and No signs of Nausea or vomiting  Last Vitals:  Filed Vitals:   07/08/12 1625  BP: 165/85  Pulse: 92  Temp: 37.2 C  Resp: 18    Post-op Vital Signs: stable   Complications: No apparent anesthesia complications

## 2012-07-08 NOTE — Anesthesia Preprocedure Evaluation (Addendum)
Anesthesia Evaluation  Patient identified by MRN, date of birth, ID band Patient awake    Reviewed: Allergy & Precautions, H&P , NPO status , Patient's Chart, lab work & pertinent test results  Airway Mallampati: III TM Distance: <3 FB Neck ROM: Full    Dental No notable dental hx.    Pulmonary neg pulmonary ROS,  breath sounds clear to auscultation  + decreased breath sounds      Cardiovascular hypertension, + CAD, + Past MI, + CABG and + Peripheral Vascular Disease Rhythm:Regular Rate:Normal     Neuro/Psych negative neurological ROS  negative psych ROS   GI/Hepatic negative GI ROS, Neg liver ROS,   Endo/Other  diabetes, Type 1, Insulin Dependent  Renal/GU negative Renal ROS  negative genitourinary   Musculoskeletal negative musculoskeletal ROS (+)   Abdominal   Peds negative pediatric ROS (+)  Hematology negative hematology ROS (+)   Anesthesia Other Findings   Reproductive/Obstetrics negative OB ROS                           Anesthesia Physical Anesthesia Plan  ASA: III  Anesthesia Plan: General   Post-op Pain Management:    Induction: Intravenous  Airway Management Planned: Oral ETT and Video Laryngoscope Planned  Additional Equipment:   Intra-op Plan:   Post-operative Plan: Extubation in OR  Informed Consent: I have reviewed the patients History and Physical, chart, labs and discussed the procedure including the risks, benefits and alternatives for the proposed anesthesia with the patient or authorized representative who has indicated his/her understanding and acceptance.   Dental advisory given  Plan Discussed with: CRNA and Surgeon  Anesthesia Plan Comments:        Anesthesia Quick Evaluation

## 2012-07-08 NOTE — Progress Notes (Signed)
Notified Dr. Okey Dupre about patient's glucose and NPO status. Order received for insulin.

## 2012-07-09 LAB — GLUCOSE, CAPILLARY: Glucose-Capillary: 257 mg/dL — ABNORMAL HIGH (ref 70–99)

## 2012-07-09 NOTE — Op Note (Signed)
NAMEMARISOL, [name]                   ACCOUNT NO.:  [id number]  MEDICAL RECORD NO.:  [id number]  LOCATION:  1607                         FACILITY:  [facility]  PHYSICIAN:  Vanita Panda. 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  PROCEDURES: 1. Left shoulder manipulation under anesthesia. 2. Left shoulder arthroscopy with extensive debridement.  FINDINGS:  Scar tissue, bursitis and inflamed tissue, and synovitis throughout the left shoulder.  SURGEON:  Vanita Panda. Magnus Ivan, M.D.  ANESTHESIA: 1. Regional left shoulder block. 2. General.  BLOOD LOSS:  Minimal.  COMPLICATIONS:  None.  INDICATIONS:  [name] is a 56 year old poorly-controlled diabetic, who had some left shoulder and impingement syndrome, which we took him to the operating room in May of this year.  Postoperatively, he did well for a little while.  He went through extensive physical therapy and his pain has come back to the point that he was starting to have a lot of guarding of his shoulder, developed an arthrofibrosis and frozen shoulder.  We have tried some steroid injections in his shoulder.  At this point, we could not do that anymore because his blood glucose has been under such poor control.  At this point, due to the failure of conservative treatment and physical therapy, rest time, and even anti- inflammatories, we had to proceed with an arthroscopic intervention. The risks and benefits of surgery were explained to him in detail and he did wish to proceed with the manipulation under anesthesia  and extensive debridement with the goal of getting his shoulder moving as quickly as possible.  PROCEDURE DESCRIPTION:  After informed consent was obtained, appropriate left shoulder was marked, anesthesia was obtained at regional shoulder block, he was then brought to the operating room and placed on the operating table, general anesthesia was then obtained.  He was then fashioned into a beach-chair position with appropriate position of the head and neck, bending of the waist and knees, and palpable pulses in his feet.  He had padding of the down on the operative right arm, and his left arm was prepped and draped with DuraPrep and sterile drapes and then placed in in-line skeletal traction using the fishing pole traction device and 10 pounds of traction.  Of note, prior to setting him up, a time-out was called, and he was identified as correct patient and correct left lower extremity.  I then was able to manipulate his shoulder before setting him up with bringing his shoulder all the way to an abducted and forward flexion and extended positions as well as externally rotated.  I could feel the scar tissue freeing up and I was able to get his overhead motion full.  I then entered the glenohumeral joint to the posterior portal and I found a scarring of the labrum and inflamed tissue.  The anterior capsule was scar tissue.  I found the undersurface of the rotator cuff, and his biceps tendon and subscapularis tendons all be intact.  I made an incision in the rotator interval in the front of his shoulder and placed an arthroscopic soft tissue ablation wand and released tissue and capsule anteriorly, complete frayed tissue that was all around the labrum and the undersurface of the rotator cuff as an inflamed tissue.  I then entered the subacromial space to the posterior portal, made a separate lateral portal.  I found his rotator cuff to be intact and it looked like he had adequate space  between the rotator cuff and the acromion with at least two widths of high-speed bur in between.  We used a soft tissue ablation wand and carried out extensive debridement in the subacromial space.  I then debrided between the distal clavicle and the acromion with the high- speed bur and the soft tissue ablation wand.  I released the remnants of CA ligament again once his shoulder moved fluently.  I did a lot of cleaning again in the subdeltoid area as well.  I then removed all instrumentation and closed each portal site with interrupted nylon suture.  Xeroform followed by well-padded sterile dressings were applied.  His arm was placed in a sling.  He was awakened, extubated and taken to the recovery room in stable condition.  All final counts were correct.  There were no complications noted.  Postoperatively, I am going to have him work on shoulder motion as much as possible with follow up in the office in a week.     Vanita Panda. Magnus Ivan, M.D.     CYB/MEDQ  D:  07/08/2012  T:  07/09/2012  Job:  [id number]

## 2012-07-09 NOTE — Progress Notes (Signed)
Patient ID: [name], male   DOB: 06-06-1956, 56 y.o.   MRN: [id number] Looks good.  Can discharge to home today.

## 2012-07-09 NOTE — Discharge Summary (Signed)
Patient ID: [name] MRN: [id number] DOB/AGE: 1956-06-03 56 y.o.  Admit date: 07/08/2012 Discharge date: 07/09/2012  Admission Diagnoses:  Active Problems:  Frozen shoulder syndrome   Discharge Diagnoses:  Same  Past Medical History  Diagnosis Date  . Diabetes mellitus   . Hyperlipidemia   . Myocardial infarction 02/2001  . CAD (coronary artery disease)     OV, Dr Doristine Bosworth, MYOVIEW 5/12 on chart  EKG 10/12 EPIC,  chest x ray 01/07/11 EPIC  . Peripheral vascular disease, unspecified   . Hypertension   . Arthritis   . Pain     LEFT SHOULDER - PREVIOUS LEFT SHOULDER SURGERY MAY 2013  . Neuropathy, peripheral     Surgeries: Procedure(s): ARTHROSCOPY SHOULDER on 07/08/2012   Consultants:    Discharged Condition: Improved  Hospital Course: [name] is an 56 y.o. male who was admitted 07/08/2012 for operative treatment of<principal problem not specified>. Patient has severe unremitting pain that affects sleep, daily activities, and work/hobbies. After pre-op clearance the patient was taken to the operating room on 07/08/2012 and underwent  Procedure(s): ARTHROSCOPY SHOULDER.    Patient was given perioperative antibiotics: Anti-infectives     Start     Dose/Rate Route Frequency Ordered Stop   07/08/12 1241   ceFAZolin (ANCEF) IVPB 2 g/50 mL premix        2 g 100 mL/hr over 30 Minutes Intravenous 60 min pre-op 07/08/12 1241 07/08/12 1503           Patient was given sequential compression devices, early ambulation, and chemoprophylaxis to prevent DVT.  Patient benefited maximally from hospital stay and there were no complications.    Recent vital signs: Patient Vitals for the past 24 hrs:  BP Temp Temp src Pulse Resp SpO2 Height Weight  07/09/12 0513 130/76 mmHg 98.2 F (36.8 C) Oral 73  16  97 % - -  07/09/12 0150 161/88 mmHg 98.5 F (36.9 C) Oral 84  20  96 % - -  07/08/12 2040 158/75 mmHg 98.3 F (36.8 C) Oral 86  16  99 % - -  07/08/12 1945 149/72 mmHg  98.7 F (37.1 C) Oral 88  16  100 % - -  07/08/12 1830 172/77 mmHg 98.4 F (36.9 C) - 79  16  98 % - -  07/08/12 1737 156/84 mmHg 98.2 F (36.8 C) - 88  16  95 % - -  07/08/12 1730 - 98.2 F (36.8 C) - 93  16  95 % 5\' 11"  (1.803 m) 111.131 kg (245 lb)  07/08/12 1719 153/85 mmHg 98.4 F (36.9 C) - 87  16  - - -  07/08/12 1715 153/85 mmHg - - 87  16  100 % - -  07/08/12 1700 141/81 mmHg - - 89  16  99 % - -  07/08/12 1645 158/84 mmHg - - 89  17  100 % - -  07/08/12 1630 157/86 mmHg - - 91  18  100 % - -  07/08/12 1625 165/85 mmHg 98.9 F (37.2 C) - 92  18  100 % - -  07/08/12 1445 - - - - - 97 % - -  07/08/12 1435 - - - 89  - 98 % - -  07/08/12 1434 - - - 90  - 96 % - -  07/08/12 1433 - - - 89  - 95 % - -  07/08/12 1432 - - - 91  - 96 % - -  07/08/12 1431 - - -  91  - 95 % - -  07/08/12 1430 153/78 mmHg - - 88  - 94 % - -  07/08/12 1429 - - - 88  - 95 % - -  07/08/12 1428 - - - 90  - 95 % - -  07/08/12 1427 - - - 91  - 96 % - -  07/08/12 1426 - - - 87  - 98 % - -  07/08/12 1425 - - - 84  - 99 % - -  07/08/12 1424 - - - 84  - 98 % - -  07/08/12 1423 - - - 82  - 98 % - -  07/08/12 1422 - - - 82  - 98 % - -  07/08/12 1421 - - - 82  - 98 % - -  07/08/12 1420 - - - 81  - 98 % - -  07/08/12 1419 - - - 83  - 99 % - -  07/08/12 1418 - - - 82  - 98 % - -  07/08/12 1237 155/81 mmHg 98.7 F (37.1 C) Oral 91  18  99 % - -     Recent laboratory studies: No results found for this basename: WBC:2,HGB:2,HCT:2,PLT:2,NA:2,K:2,CL:2,CO2:2,BUN:2,CREATININE:2,GLUCOSE:2,PT:2,INR:2,CALCIUM,2: in the last 72 hours   Discharge Medications:     Medication List     As of 07/09/2012 10:21 AM    TAKE these medications         aspirin 325 MG tablet   Take 325 mg by mouth daily with breakfast.      clopidogrel 75 MG tablet   Commonly known as: PLAVIX   Take 75 mg by mouth daily with breakfast.      fenofibrate 145 MG tablet   Commonly known as: TRICOR   Take 145 mg by mouth daily with  breakfast.      gabapentin 300 MG capsule   Commonly known as: NEURONTIN   Take 300 mg by mouth 3 (three) times daily.      glyBURIDE-metformin 2.5-500 MG per tablet   Commonly known as: GLUCOVANCE   Take 1 tablet by mouth 2 (two) times daily with a meal.      insulin glargine 100 UNIT/ML injection   Commonly known as: LANTUS   Inject 70 Units into the skin 2 (two) times daily.      lisinopril-hydrochlorothiazide 20-12.5 MG per tablet   Commonly known as: PRINZIDE,ZESTORETIC   Take 1 tablet by mouth daily with breakfast.      metoprolol tartrate 25 MG tablet   Commonly known as: LOPRESSOR   Take 25 mg by mouth daily with breakfast.        Diagnostic Studies: Dg Chest 2 View  07/05/2012  *RADIOLOGY REPORT*  Clinical Data: Preop left shoulder arthroplasty  CHEST - 2 VIEW  Comparison: 01/07/2011  Findings: Previous median sternotomy and CABG.  The heart size and mediastinal contours are within normal limits.  Both lungs are clear.  The visualized skeletal structures are unremarkable.  IMPRESSION: No active cardiopulmonary abnormalities.   Original Report Authenticated By: Signa Kell, M.D.     Disposition: 01-Home or Self Care      Discharge Orders    Future Appointments: Provider: Department: Dept Phone: Center:   10/27/2012 10:30 AM [facility] [facility] [phone] [facility]   10/27/2012 11:00 AM Evern Bio, NP [facility] [phone] [facility]     Future Orders Please Complete By Expires   Diet - low sodium heart  healthy      Call MD / Call 911      Comments:   If you experience chest pain or shortness of breath, CALL 911 and be transported to the hospital emergency room.  If you develope a fever above 101 F, pus (white drainage) or increased drainage or redness at the wound, or calf pain, call your surgeon's office.   Constipation Prevention      Comments:   Drink plenty of fluids.  Prune juice may be helpful.   You may use a stool softener, such as Colace (over the counter) 100 mg twice a day.  Use MiraLax (over the counter) for constipation as needed.   Increase activity slowly as tolerated      Discharge instructions      Comments:   You can shower as soon as you want and get your incisions wet.  Daily band-aids as needed. Ice several times throughout the day for the next several days. Come out of your sling as comfort allows and get your shoulder moving as much as possible.   Discharge patient         Follow-up Information    Follow up with [name], MD. In 1 week.   Contact information:   [address] [place] [address] [phone]           Signed: [name] 07/09/2012, 10:21 AM

## 2012-07-09 NOTE — Progress Notes (Signed)
Pt to d/c home. AVS reviewed and "My Chart" discussed with pt. Pt capable of verbalizing medications and follow-up appointments. Remains hemodynamically stable. No signs and symptoms of distress. Educated pt to return to ER in the case of SOB, dizziness, or chest pain.  

## 2012-07-11 ENCOUNTER — Encounter (HOSPITAL_COMMUNITY): Payer: Self-pay | Admitting: Orthopaedic Surgery

## 2012-10-04 ENCOUNTER — Encounter: Payer: Self-pay | Admitting: Dietician

## 2012-10-04 ENCOUNTER — Encounter: Payer: BC Managed Care – PPO | Attending: Endocrinology | Admitting: Dietician

## 2012-10-04 VITALS — Ht 71.0 in | Wt 243.9 lb

## 2012-10-04 DIAGNOSIS — E119 Type 2 diabetes mellitus without complications: Secondary | ICD-10-CM

## 2012-10-04 DIAGNOSIS — Z713 Dietary counseling and surveillance: Secondary | ICD-10-CM | POA: Insufficient documentation

## 2012-10-04 NOTE — Progress Notes (Signed)
Medical Nutrition Therapy:  Appt start time: 1615 end time:  1745.    Assessment:  Primary concerns today: Comes for dietary review. History of Type 2 diabetes for 16 years.  The majority of the time he has not controlled his glucose levels.  He recently learned that on lab testing, he has the beginnings of some issues with his kidneys.  He has started to try to control his blood glucose.  He has eliminated sweetened tea, concentrated sweets, and diet soda from his diet.  He is regularly checking his blood glucose.  He has a history of CABG and cardiac rehab in the past.  In the last year, he has had 2 surgeries to his left shoulder and is now doing rehab exercises at home and says that the shoulder "is not where it should be."  He is having pain in his left leg where he previously had a venous graph placed.  This circulation pain is accompanied by some peripheral neuropathy in both legs.  Mobility is becoming more of an issue and he verbalizes distress regarding the proposed complex surgical regimen for the LF leg venous repair.  Today he is accompanied by his significant other who is supportive of him.  MEDICATIONS: Completed review of medications.  DM meds include:  Lantus 70 units in AM and PM; Glyburide/Metformin 5/200 2 tabs in the morning.  HYPERGLYCEMIA:  Having fewer of the symptoms.  Continues to drink a lot of water.  HYPOGLYCEMIA:  Has had 1-2 instances of the shaky, sweaty symptoms and blood glucose was at the 80-90 range.  Treated with eating something.  Reviewed the Rule of 15 with him.  FOOT SELF-EXAM:  Wife checks his feet 3 times per week.  Ask that given his neuropathy and circulation problems, they do daily exams.  Dilated Eye Exam:  Had exam in 2013.  BLOOD GLUCOSE MONITORING:  Currently monitoring fasting and PRN levels.  Fasting: 120-150 mg/dl  ("if I eat late or the wrong thing, it can go to 200 mg, but not like it use to.")   DIETARY INTAKE:  Usual eating pattern  includes 3 meals and 1-2 snacks per day.   Avoided foods include Sweetened tea and diet soda.    24-hr recall:  B ( AM): 5:00 Use to have big ice tea (sweet) with a biscuit or sandwich.  Now:  Sandwich or pack of cracker or bottle of water, or unsweetened tea.  8:30 breakfast oatmeal 8 oz with a little brown sugar. 2 slices of wheat toast rare margarine.   OR a couple of scrambled eggs with 2 slice of whole wheat toast OR an egg sandwich  Snk ( AM): None  L ( PM): 12:15-12:30  Steak and nothing with it.   Snk ( PM): Have unsweetened tea and balona and cheese sandwich. D ( PM): 6:00 BBQ, coleslaw and french fries and unsweeten tea Snk ( PM): rarely Beverages: Unsweetened tea, water.   Usual physical activity: Currently not doing any aerobic exercise.  He is doing shoulder and upper body exercises 3-4 times per week.  Acknowledges that he needs to do some walking.  He and wife verbalized a plan to begin walking.  Estimated energy needs:HT: 71 in   WT: 243.9 lb   BMI:  34/1 kg/m2  Adj WT: 199 lb (91 kg) 1800 calories 200-205 g carbohydrates 130-135 g protein 48-50 g fat  Progress Towards Goal(s):  No progress.   Nutritional Diagnosis:  [place]-2.1 Inpaired nutrition utilization As related  to glucose.  As evidenced by History of DM type 2 for 16 years, A1C at 10.8%, and peripheral neuropathy and circulation issures..    Intervention:  Nutrition Has eliminated sweetened tea, diet soda and concentrated sweets from diet.  Eating more whole grain foods.  Is reading some food labels and generally trying to eat better and gain control of his blood glucose.  Needs to start some low level walking and advance as the claudication and the peripheral neuropathy will allow.  Handouts given during visit include:  Living Well with Diabetes  Controlling Blood Glucose  Yellow card with diet prescription and change list  Carb Counting Guide   Monitoring/Evaluation:  Dietary intake, exercise, blood  glucose levels, and body weight as needed. and to call or e-mail with questions.

## 2012-10-05 ENCOUNTER — Encounter: Payer: Self-pay | Admitting: Dietician

## 2012-10-05 NOTE — Patient Instructions (Signed)
   You or your wife check your feet daily for any changes that indicate possible infection or inflammation.  If problem occurs, have MD examine immediately.  Continue to check daily glucose levels.  Check especially with the symptoms of low blood sugar.  Record and take to all MD appointments.  Try to get some after meal glucose levels to give you an idea of what the meals are doing to your blood glucose.  Continue to eliminate the sweetened beverages and the concentrated sweets in your diet.  Try to start walking.   Start low and slow, but start.  Aim for 30-45 minutes 3-4 times per week.  Take your insulin at the same time every day.  You get better coverage taking it every 12 hours.  Call the [phone] for questions.

## 2012-10-27 ENCOUNTER — Ambulatory Visit: Payer: BC Managed Care – PPO | Admitting: Neurosurgery

## 2012-11-09 ENCOUNTER — Encounter: Payer: Self-pay | Admitting: Vascular Surgery

## 2012-11-10 ENCOUNTER — Encounter: Payer: Self-pay | Admitting: Vascular Surgery

## 2012-11-10 ENCOUNTER — Encounter (INDEPENDENT_AMBULATORY_CARE_PROVIDER_SITE_OTHER): Payer: BC Managed Care – PPO | Admitting: *Deleted

## 2012-11-10 ENCOUNTER — Ambulatory Visit (INDEPENDENT_AMBULATORY_CARE_PROVIDER_SITE_OTHER): Payer: BC Managed Care – PPO | Admitting: Vascular Surgery

## 2012-11-10 VITALS — BP 122/75 | HR 84 | Resp 20 | Ht 70.0 in | Wt 245.0 lb

## 2012-11-10 DIAGNOSIS — Z48812 Encounter for surgical aftercare following surgery on the circulatory system: Secondary | ICD-10-CM | POA: Insufficient documentation

## 2012-11-10 DIAGNOSIS — I739 Peripheral vascular disease, unspecified: Secondary | ICD-10-CM

## 2012-11-10 NOTE — Progress Notes (Signed)
Patient is a 57 year old male who returns for followup today. He has known peripheral arterial disease. He previously underwent multiple peripheral interventions in his lower extremities by Dr. Gery Pray. He ultimately ended up with a left leg bypass which failed early. He has previously had vein harvested from the right leg in the left leg. He denies rest pain. He can walk greater than 1 mile currently without claudication symptoms. He is not smoking. He has changed his diet significantly to try to improve his diabetes. He is seeing a nutritionist. I congratulated him today on many positive for lifestyle changes. He does get some shortness of breath at a mile and this occurs certainly before any claudication symptoms.  Review of systems: Dyspnea with exertion as mentioned above. He denies chest pain.  Physical exam:  Filed Vitals:   11/10/12 1308  BP: 122/75  Pulse: 84  Resp: 20  Height: 5\' 10"  (1.778 m)  Weight: 245 lb (111.131 kg)  SpO2: 100%   Neck: No carotid bruits  S.: Clear to auscultation bilaterally  Cardiac: Regular rate and rhythm without murmur  Abdomen: Soft nontender slightly obese  Chimneys: 2+ femoral pulses bilaterally with absent popliteal and pedal pulses  Skin: No ulcers  Patient had bilateral ABIs today which I reviewed and interpreted. ABI on the right was 1.07 with toe pressure of 87 left was 0.98 with toe pressure of 61.  He also had evidence of medial calcification  Assessment: Stable peripheral arterial disease bilateral lower extremities  Plan: Follow up one year with repeat ABIs  Fabienne Bruns, MD [facility] Office: [phone] Pager: [phone]

## 2012-11-10 NOTE — Addendum Note (Signed)
Addended by: Adria Dill L on: 11/10/2012 03:38 PM   Modules accepted: Orders

## 2012-11-11 ENCOUNTER — Telehealth (HOSPITAL_COMMUNITY): Payer: Self-pay | Admitting: *Deleted

## 2013-01-18 ENCOUNTER — Other Ambulatory Visit (HOSPITAL_COMMUNITY): Payer: Self-pay | Admitting: Cardiovascular Disease

## 2013-01-18 ENCOUNTER — Telehealth (HOSPITAL_COMMUNITY): Payer: Self-pay | Admitting: Cardiovascular Disease

## 2013-01-18 DIAGNOSIS — I25810 Atherosclerosis of coronary artery bypass graft(s) without angina pectoris: Secondary | ICD-10-CM

## 2013-01-18 NOTE — Telephone Encounter (Signed)
LEFT MESSAGE FOR PATIENT TO CALL AND SCHEDULE APPT 

## 2013-02-13 ENCOUNTER — Telehealth (HOSPITAL_COMMUNITY): Payer: Self-pay | Admitting: Cardiovascular Disease

## 2013-02-27 ENCOUNTER — Telehealth (HOSPITAL_COMMUNITY): Payer: Self-pay | Admitting: Cardiovascular Disease

## 2013-02-27 NOTE — Telephone Encounter (Signed)
Sent letter to patient to call back and schedule testing ordered by Dr. [name]

## 2013-03-06 ENCOUNTER — Encounter (HOSPITAL_COMMUNITY): Payer: Self-pay | Admitting: Cardiovascular Disease

## 2013-03-16 ENCOUNTER — Ambulatory Visit (HOSPITAL_COMMUNITY)
Admission: RE | Admit: 2013-03-16 | Discharge: 2013-03-16 | Disposition: A | Discharge status: Home / Self Care | Payer: BC Managed Care – PPO | Source: Ambulatory Visit | Attending: Cardiovascular Disease | Admitting: Cardiovascular Disease

## 2013-03-16 DIAGNOSIS — Z794 Long term (current) use of insulin: Secondary | ICD-10-CM | POA: Insufficient documentation

## 2013-03-16 DIAGNOSIS — I10 Essential (primary) hypertension: Secondary | ICD-10-CM | POA: Insufficient documentation

## 2013-03-16 DIAGNOSIS — R5381 Other malaise: Secondary | ICD-10-CM | POA: Insufficient documentation

## 2013-03-16 DIAGNOSIS — I252 Old myocardial infarction: Secondary | ICD-10-CM | POA: Insufficient documentation

## 2013-03-16 DIAGNOSIS — E669 Obesity, unspecified: Secondary | ICD-10-CM | POA: Insufficient documentation

## 2013-03-16 DIAGNOSIS — I739 Peripheral vascular disease, unspecified: Secondary | ICD-10-CM | POA: Insufficient documentation

## 2013-03-16 DIAGNOSIS — I2510 Atherosclerotic heart disease of native coronary artery without angina pectoris: Secondary | ICD-10-CM

## 2013-03-16 DIAGNOSIS — E119 Type 2 diabetes mellitus without complications: Secondary | ICD-10-CM | POA: Insufficient documentation

## 2013-03-16 DIAGNOSIS — R0609 Other forms of dyspnea: Secondary | ICD-10-CM | POA: Insufficient documentation

## 2013-03-16 DIAGNOSIS — Z951 Presence of aortocoronary bypass graft: Secondary | ICD-10-CM | POA: Insufficient documentation

## 2013-03-16 DIAGNOSIS — I25810 Atherosclerosis of coronary artery bypass graft(s) without angina pectoris: Secondary | ICD-10-CM

## 2013-03-16 DIAGNOSIS — R0989 Other specified symptoms and signs involving the circulatory and respiratory systems: Secondary | ICD-10-CM | POA: Insufficient documentation

## 2013-03-16 DIAGNOSIS — Z87891 Personal history of nicotine dependence: Secondary | ICD-10-CM | POA: Insufficient documentation

## 2013-03-16 DIAGNOSIS — R42 Dizziness and giddiness: Secondary | ICD-10-CM | POA: Insufficient documentation

## 2013-03-16 MED ORDER — TECHNETIUM TC 99M SESTAMIBI GENERIC - CARDIOLITE
10.4000 | Freq: Once | INTRAVENOUS | Status: AC | PRN
  Administered 2013-03-16: 10 via INTRAVENOUS

## 2013-03-16 MED ORDER — REGADENOSON 0.4 MG/5ML IV SOLN
0.4000 mg | Freq: Once | INTRAVENOUS | Status: AC
  Administered 2013-03-16: 0.4 mg via INTRAVENOUS

## 2013-03-16 MED ORDER — TECHNETIUM TC 99M SESTAMIBI GENERIC - CARDIOLITE
30.1000 | Freq: Once | INTRAVENOUS | Status: AC | PRN
  Administered 2013-03-16: 30.1 via INTRAVENOUS

## 2013-03-16 NOTE — Procedures (Addendum)
[facility] [address] [place] [address] [place] [address] [phone]  Cardiology Nuclear Med Study  [name] is a 57 y.o. male     MRN : [id number]     DOB: 1955/08/03  Procedure Date: 03/16/2013  Nuclear Med Background Indication for Stress Test:  Graft Patency History:  cad;mi;cabg x4--03/2001 Cardiac Risk Factors: History of Smoking, Hypertension, IDDM Type 2, Lipids, Obesity and PVD  Symptoms:  DOE, Fatigue and Light-Headedness   Nuclear Pre-Procedure Caffeine/Decaff Intake:  12:00am NPO After: 10am   IV Site: R Hand  IV 0.9% NS with Angio Cath:  22g  Chest Size (in):  46"  IV Started by: Emmit Pomfret, RN  Height: 5\' 10"  (1.778 m)  Cup Size: n/a  BMI:  Body mass index is 35.15 kg/(m^2). Weight:  245 lb (111.131 kg)   Tech Comments:  n/a    Nuclear Med Study 1 or 2 day study: 1 day  Stress Test Type:  Lexiscan  Order Authorizing Provider:  Christiane Ha berry,md   Resting Radionuclide: Technetium 44m Sestamibi  Resting Radionuclide Dose: 10.4 mCi   Stress Radionuclide:  Technetium 58m Sestamibi  Stress Radionuclide Dose: 30.1 mCi           Stress Protocol Rest HR: 85 Stress HR: 105  Rest BP: 149/84 Stress BP: 149/85  Exercise Time (min): n/a METS: n/a   Predicted Max HR: 162 bpm % Max HR: 64.81 bpm Rate Pressure Product: 46962  Dose of Adenosine (mg):  n/a Dose of Lexiscan: 0.4 mg  Dose of Atropine (mg): n/a Dose of Dobutamine: n/a mcg/kg/min (at max HR)  Stress Test Technologist: Esperanza Sheets, CCT Nuclear Technologist: Gonzella Lex, CNMT   Rest Procedure:  Myocardial perfusion imaging was performed at rest 45 minutes following the intravenous administration of Technetium 69m Sestamibi. Stress Procedure:  The patient received IV Lexiscan 0.4 mg over 15-seconds.  Technetium 37m Sestamibi injected at 30-seconds.  There were no significant changes with Lexiscan.  Quantitative spect images were obtained after a 45  minute delay.  Transient Ischemic Dilatation (Normal <1.22):  0.98 Lung/Heart Ratio (Normal <0.45):  0.34 QGS EDV:  110 ml QGS ESV:  51 ml LV Ejection Fraction: 54%  Signed by      Rest ECG: NSR with non-specific inferolateral T wave changes   Stress ECG: No significant change from baseline ECG  QPS Raw Data Images:  Normal; no motion artifact; normal heart/lung ratio. Stress Images:  Normal homogeneous uptake in all areas of the myocardium. Rest Images:  Normal homogeneous uptake in all areas of the myocardium. Subtraction (SDS):  No evidence of ischemia.  Impression Exercise Capacity:  Lexiscan with no exercise. BP Response:  Normal blood pressure response. Clinical Symptoms:  No significant symptoms noted. ECG Impression:  No significant new ST segment change suggestive of ischemia. Comparison with Prior Nuclear Study: No significant change from previous study; EF has improved from 49% to 54%.  Overall Impression:  Normal stress nuclear study.  LV Wall Motion:  NL LV Function, EF 54%; NL Wall Motion   Lennette Bihari, MD  03/16/2013 6:24 PM

## 2013-03-23 ENCOUNTER — Encounter: Payer: Self-pay | Admitting: *Deleted

## 2013-05-18 ENCOUNTER — Other Ambulatory Visit: Payer: Self-pay | Admitting: Orthopaedic Surgery

## 2013-05-18 DIAGNOSIS — M25512 Pain in left shoulder: Secondary | ICD-10-CM

## 2013-05-27 ENCOUNTER — Ambulatory Visit
Admission: RE | Admit: 2013-05-27 | Discharge: 2013-05-27 | Disposition: A | Discharge status: Home / Self Care | Payer: BC Managed Care – PPO | Source: Ambulatory Visit | Attending: Orthopaedic Surgery | Admitting: Orthopaedic Surgery

## 2013-05-27 DIAGNOSIS — M25512 Pain in left shoulder: Secondary | ICD-10-CM

## 2013-06-14 ENCOUNTER — Other Ambulatory Visit (HOSPITAL_COMMUNITY): Payer: Self-pay | Admitting: Orthopaedic Surgery

## 2013-07-03 ENCOUNTER — Other Ambulatory Visit (HOSPITAL_COMMUNITY): Payer: Self-pay | Admitting: Cardiovascular Disease

## 2013-07-06 ENCOUNTER — Encounter (HOSPITAL_COMMUNITY): Payer: Self-pay

## 2013-07-06 ENCOUNTER — Encounter (HOSPITAL_COMMUNITY)
Admission: RE | Admit: 2013-07-06 | Discharge: 2013-07-06 | Disposition: A | Discharge status: Home / Self Care | Payer: BC Managed Care – PPO | Source: Ambulatory Visit | Attending: Orthopaedic Surgery | Admitting: Orthopaedic Surgery

## 2013-07-06 ENCOUNTER — Encounter (HOSPITAL_COMMUNITY): Payer: Self-pay | Admitting: Pharmacy Technician

## 2013-07-06 ENCOUNTER — Other Ambulatory Visit (HOSPITAL_COMMUNITY): Payer: Self-pay | Admitting: Orthopaedic Surgery

## 2013-07-06 ENCOUNTER — Ambulatory Visit (HOSPITAL_COMMUNITY)
Admission: RE | Admit: 2013-07-06 | Discharge: 2013-07-06 | Disposition: A | Discharge status: Home / Self Care | Payer: BC Managed Care – PPO | Source: Ambulatory Visit | Attending: Orthopaedic Surgery | Admitting: Orthopaedic Surgery

## 2013-07-06 DIAGNOSIS — Z01818 Encounter for other preprocedural examination: Secondary | ICD-10-CM | POA: Insufficient documentation

## 2013-07-06 DIAGNOSIS — Z01812 Encounter for preprocedural laboratory examination: Secondary | ICD-10-CM | POA: Insufficient documentation

## 2013-07-06 DIAGNOSIS — Z951 Presence of aortocoronary bypass graft: Secondary | ICD-10-CM | POA: Insufficient documentation

## 2013-07-06 DIAGNOSIS — M7500 Adhesive capsulitis of unspecified shoulder: Secondary | ICD-10-CM | POA: Insufficient documentation

## 2013-07-06 DIAGNOSIS — I10 Essential (primary) hypertension: Secondary | ICD-10-CM | POA: Insufficient documentation

## 2013-07-06 DIAGNOSIS — E119 Type 2 diabetes mellitus without complications: Secondary | ICD-10-CM | POA: Insufficient documentation

## 2013-07-06 LAB — BASIC METABOLIC PANEL
Chloride: 97 mEq/L (ref 96–112)
Creatinine, Ser: 0.86 mg/dL (ref 0.50–1.35)
GFR calc Af Amer: >90 mL/min (ref >90)
Potassium: 4.3 mEq/L (ref 3.5–5.1)

## 2013-07-06 LAB — CBC
HCT: 37.1 % — ABNORMAL LOW (ref 39.0–52.0)
RDW: 12.6 % (ref 11.5–15.5)
WBC: 9.4 10*3/uL (ref 4.0–10.5)

## 2013-07-06 NOTE — Telephone Encounter (Signed)
Rx was sent to pharmacy electronically. 

## 2013-07-06 NOTE — Patient Instructions (Addendum)
20 LEAMAN ABE  07/06/2013   Your procedure is scheduled on: 07-14-2013  Report to [facility] at 530  AM.  Call this number if you have problems the morning of surgery [phone]   Remember: call DR [facility] AND ASK ABOUT STOPPING ASPIRIN AND PLAVIX   Do not eat food or drink liquids :After Midnight.     Take these medicines the morning of surgery with A SIP OF WATER: Take 1/2 dose  PM LEVEMIR insulin on 07-13-2013, GABAPENTIN, METOPROLOL SUCCINATE                                SEE [place] PREPARING FOR SURGERY SHEET             You may not have any metal on your body including hair pins and piercings  Do not wear jewelry, make-up.  Do not wear lotions, powders, or perfumes. You may wear deodorant.   Men may shave face and neck.  Do not bring valuables to the hospital.  IS NOT RESPONSIBLE FOR VALUEABLES.  Contacts, dentures or bridgework may not be worn into surgery.  Leave suitcase in the car. After surgery it may be brought to your room.  For patients admitted to the hospital, checkout time is 11:00 AM the day of discharge.   Patients discharged the day of surgery will not be allowed to drive home.  Name and phone number of your driver:  Special Instructions: N/A   Please read over the following fact sheets that you were given:   Call Cain Sieve RN pre op nurse if needed [phone]    FAILURE TO FOLLOW THESE INSTRUCTIONS MAY RESULT IN THE CANCELLATION OF YOUR SURGERY.  PATIENT SIGNATURE___________________________________________  NURSE SIGNATURE_____________________________________________

## 2013-07-06 NOTE — Progress Notes (Signed)
07/06/13 1438  OBSTRUCTIVE SLEEP APNEA  Have you ever been diagnosed with sleep apnea through a sleep study? No  Do you snore loudly (loud enough to be heard through closed doors)?  1  Do you often feel tired, fatigued, or sleepy during the daytime? 1  Has anyone observed you stop breathing during your sleep? 1  Do you have, or are you being treated for high blood pressure? 1  BMI more than 35 kg/m2? 0  Age over 59 years old? 1  Neck circumference greater than 40 cm/18 inches? 0  Gender: 1  Obstructive Sleep Apnea Score 6  Score 4 or greater  Results sent to PCP

## 2013-07-06 NOTE — Progress Notes (Addendum)
Myocardial perfusion with resting ekg 03-16-13 epic under cv proc lov dr fields vascular 11-10-12 epic lov dr berry cardiology 05-04-12 on chart ekg 03-16-13 epic

## 2013-07-07 NOTE — Progress Notes (Signed)
bmet results faxed by epic to dr olin 

## 2013-07-14 ENCOUNTER — Encounter (HOSPITAL_COMMUNITY): Payer: BC Managed Care – PPO | Admitting: Anesthesiology

## 2013-07-14 ENCOUNTER — Encounter (HOSPITAL_COMMUNITY)
Admission: RE | Disposition: A | Discharge status: Home / Self Care | Payer: Self-pay | Source: Ambulatory Visit | Attending: Orthopaedic Surgery

## 2013-07-14 ENCOUNTER — Ambulatory Visit (HOSPITAL_COMMUNITY): Payer: BC Managed Care – PPO | Admitting: Anesthesiology

## 2013-07-14 ENCOUNTER — Ambulatory Visit (HOSPITAL_COMMUNITY)
Admission: RE | Admit: 2013-07-14 | Discharge: 2013-07-14 | Disposition: A | Discharge status: Home / Self Care | Payer: BC Managed Care – PPO | Source: Ambulatory Visit | Attending: Orthopaedic Surgery | Admitting: Orthopaedic Surgery

## 2013-07-14 ENCOUNTER — Encounter (HOSPITAL_COMMUNITY): Payer: Self-pay | Admitting: *Deleted

## 2013-07-14 DIAGNOSIS — Z9889 Other specified postprocedural states: Secondary | ICD-10-CM

## 2013-07-14 DIAGNOSIS — E109 Type 1 diabetes mellitus without complications: Secondary | ICD-10-CM | POA: Insufficient documentation

## 2013-07-14 DIAGNOSIS — M75110 Incomplete rotator cuff tear or rupture of unspecified shoulder, not specified as traumatic: Secondary | ICD-10-CM

## 2013-07-14 DIAGNOSIS — M19019 Primary osteoarthritis, unspecified shoulder: Secondary | ICD-10-CM | POA: Insufficient documentation

## 2013-07-14 DIAGNOSIS — M7500 Adhesive capsulitis of unspecified shoulder: Secondary | ICD-10-CM | POA: Insufficient documentation

## 2013-07-14 DIAGNOSIS — Z79899 Other long term (current) drug therapy: Secondary | ICD-10-CM | POA: Insufficient documentation

## 2013-07-14 DIAGNOSIS — E785 Hyperlipidemia, unspecified: Secondary | ICD-10-CM | POA: Insufficient documentation

## 2013-07-14 DIAGNOSIS — Z7982 Long term (current) use of aspirin: Secondary | ICD-10-CM | POA: Insufficient documentation

## 2013-07-14 DIAGNOSIS — Z794 Long term (current) use of insulin: Secondary | ICD-10-CM | POA: Insufficient documentation

## 2013-07-14 DIAGNOSIS — I739 Peripheral vascular disease, unspecified: Secondary | ICD-10-CM | POA: Insufficient documentation

## 2013-07-14 DIAGNOSIS — I2510 Atherosclerotic heart disease of native coronary artery without angina pectoris: Secondary | ICD-10-CM | POA: Insufficient documentation

## 2013-07-14 DIAGNOSIS — M24619 Ankylosis, unspecified shoulder: Secondary | ICD-10-CM | POA: Insufficient documentation

## 2013-07-14 DIAGNOSIS — Z951 Presence of aortocoronary bypass graft: Secondary | ICD-10-CM | POA: Insufficient documentation

## 2013-07-14 DIAGNOSIS — I252 Old myocardial infarction: Secondary | ICD-10-CM | POA: Insufficient documentation

## 2013-07-14 DIAGNOSIS — M75112 Incomplete rotator cuff tear or rupture of left shoulder, not specified as traumatic: Secondary | ICD-10-CM

## 2013-07-14 DIAGNOSIS — I10 Essential (primary) hypertension: Secondary | ICD-10-CM | POA: Insufficient documentation

## 2013-07-14 DIAGNOSIS — G5790 Unspecified mononeuropathy of unspecified lower limb: Secondary | ICD-10-CM | POA: Insufficient documentation

## 2013-07-14 HISTORY — PX: SHOULDER ARTHROSCOPY WITH ROTATOR CUFF REPAIR: SHX5685

## 2013-07-14 LAB — GLUCOSE, CAPILLARY
Glucose-Capillary: 240 mg/dL — ABNORMAL HIGH (ref 70–99)
Glucose-Capillary: 239 mg/dL — ABNORMAL HIGH (ref 70–99)

## 2013-07-14 SURGERY — ARTHROSCOPY, SHOULDER, WITH ROTATOR CUFF REPAIR
Anesthesia: General | Site: Shoulder | Laterality: Left

## 2013-07-14 MED ORDER — INSULIN ASPART 100 UNIT/ML ~~LOC~~ SOLN: 15.0000 [IU] | Freq: Three times a day (TID) | SUBCUTANEOUS | Status: DC

## 2013-07-14 MED ORDER — SODIUM CHLORIDE 0.9 % IV SOLN
10.0000 mg | INTRAVENOUS | Status: DC | PRN
  Administered 2013-07-14: 20 ug/min via INTRAVENOUS

## 2013-07-14 MED ORDER — INSULIN ASPART 100 UNIT/ML ~~LOC~~ SOLN: 0.0000 [IU] | Freq: Three times a day (TID) | SUBCUTANEOUS | Status: DC

## 2013-07-14 MED ORDER — SUCCINYLCHOLINE CHLORIDE 20 MG/ML IJ SOLN
INTRAMUSCULAR | Status: DC | PRN
  Administered 2013-07-14: 100 mg via INTRAVENOUS

## 2013-07-14 MED ORDER — LACTATED RINGERS IR SOLN
Status: DC | PRN
  Administered 2013-07-14: 1000 mL
  Administered 2013-07-14: 6000 mL

## 2013-07-14 MED ORDER — LACTATED RINGERS IV SOLN: INTRAVENOUS | Status: DC

## 2013-07-14 MED ORDER — FENTANYL CITRATE 0.05 MG/ML IJ SOLN
INTRAMUSCULAR | Status: AC
  Filled 2013-07-14: qty 2

## 2013-07-14 MED ORDER — CEFAZOLIN SODIUM-DEXTROSE 2-3 GM-% IV SOLR
INTRAVENOUS | Status: AC
  Filled 2013-07-14: qty 50

## 2013-07-14 MED ORDER — BUPIVACAINE-EPINEPHRINE PF 0.5-1:200000 % IJ SOLN
INTRAMUSCULAR | Status: AC
  Filled 2013-07-14: qty 30

## 2013-07-14 MED ORDER — FENTANYL CITRATE 0.05 MG/ML IJ SOLN
INTRAMUSCULAR | Status: DC | PRN
  Administered 2013-07-14 (×2): 50 ug via INTRAVENOUS

## 2013-07-14 MED ORDER — DEXAMETHASONE SODIUM PHOSPHATE 10 MG/ML IJ SOLN
INTRAMUSCULAR | Status: AC
  Filled 2013-07-14: qty 1

## 2013-07-14 MED ORDER — DEXAMETHASONE SODIUM PHOSPHATE 10 MG/ML IJ SOLN
INTRAMUSCULAR | Status: DC | PRN
  Administered 2013-07-14: 10 mg via INTRAVENOUS

## 2013-07-14 MED ORDER — METOCLOPRAMIDE HCL 5 MG/ML IJ SOLN: 5.0000 mg | Freq: Three times a day (TID) | INTRAMUSCULAR | Status: DC | PRN

## 2013-07-14 MED ORDER — PROPOFOL 10 MG/ML IV BOLUS
INTRAVENOUS | Status: DC | PRN
  Administered 2013-07-14: 150 mg via INTRAVENOUS

## 2013-07-14 MED ORDER — CEFAZOLIN SODIUM-DEXTROSE 2-3 GM-% IV SOLR
2.0000 g | INTRAVENOUS | Status: AC
  Administered 2013-07-14: 2 g via INTRAVENOUS

## 2013-07-14 MED ORDER — DIPHENHYDRAMINE HCL 12.5 MG/5ML PO ELIX: 12.5000 mg | ORAL_SOLUTION | ORAL | Status: DC | PRN

## 2013-07-14 MED ORDER — ROPIVACAINE HCL 5 MG/ML IJ SOLN
INTRAMUSCULAR | Status: DC | PRN
  Administered 2013-07-14: 30.9 mL

## 2013-07-14 MED ORDER — FENOFIBRATE 160 MG PO TABS
160.0000 mg | ORAL_TABLET | Freq: Every day | ORAL | Status: DC
  Filled 2013-07-14: qty 1

## 2013-07-14 MED ORDER — MEPERIDINE HCL 50 MG/ML IJ SOLN: 6.2500 mg | INTRAMUSCULAR | Status: DC | PRN

## 2013-07-14 MED ORDER — MORPHINE SULFATE 2 MG/ML IJ SOLN: 2.0000 mg | INTRAMUSCULAR | Status: DC | PRN

## 2013-07-14 MED ORDER — METFORMIN HCL ER 500 MG PO TB24
500.0000 mg | ORAL_TABLET | Freq: Two times a day (BID) | ORAL | Status: DC
  Filled 2013-07-14 (×2): qty 1

## 2013-07-14 MED ORDER — METHOCARBAMOL 500 MG PO TABS: 500.0000 mg | ORAL_TABLET | Freq: Four times a day (QID) | ORAL | Status: DC | PRN

## 2013-07-14 MED ORDER — SODIUM CHLORIDE 0.9 % IV SOLN: INTRAVENOUS | Status: DC

## 2013-07-14 MED ORDER — ONDANSETRON HCL 4 MG/2ML IJ SOLN
INTRAMUSCULAR | Status: AC
  Filled 2013-07-14: qty 2

## 2013-07-14 MED ORDER — HYDROCHLOROTHIAZIDE 12.5 MG PO CAPS: 12.5000 mg | ORAL_CAPSULE | Freq: Every day | ORAL | Status: DC

## 2013-07-14 MED ORDER — EPINEPHRINE HCL 1 MG/ML IJ SOLN
INTRAMUSCULAR | Status: AC
  Filled 2013-07-14: qty 2

## 2013-07-14 MED ORDER — ROCURONIUM BROMIDE 100 MG/10ML IV SOLN
INTRAVENOUS | Status: AC
  Filled 2013-07-14: qty 1

## 2013-07-14 MED ORDER — LIDOCAINE HCL (CARDIAC) 20 MG/ML IV SOLN
INTRAVENOUS | Status: DC | PRN
  Administered 2013-07-14: 100 mg via INTRAVENOUS

## 2013-07-14 MED ORDER — PROMETHAZINE HCL 25 MG/ML IJ SOLN: 6.2500 mg | INTRAMUSCULAR | Status: DC | PRN

## 2013-07-14 MED ORDER — GABAPENTIN 300 MG PO CAPS
300.0000 mg | ORAL_CAPSULE | Freq: Three times a day (TID) | ORAL | Status: DC
  Filled 2013-07-14 (×2): qty 1

## 2013-07-14 MED ORDER — FENTANYL CITRATE 0.05 MG/ML IJ SOLN: 25.0000 ug | INTRAMUSCULAR | Status: DC | PRN

## 2013-07-14 MED ORDER — METOCLOPRAMIDE HCL 10 MG PO TABS: 5.0000 mg | ORAL_TABLET | Freq: Three times a day (TID) | ORAL | Status: DC | PRN

## 2013-07-14 MED ORDER — ONDANSETRON HCL 4 MG/2ML IJ SOLN
INTRAMUSCULAR | Status: DC | PRN
  Administered 2013-07-14: 4 mg via INTRAVENOUS

## 2013-07-14 MED ORDER — METHOCARBAMOL 100 MG/ML IJ SOLN
500.0000 mg | Freq: Four times a day (QID) | INTRAVENOUS | Status: DC | PRN
  Filled 2013-07-14: qty 5

## 2013-07-14 MED ORDER — INSULIN DETEMIR 100 UNIT/ML ~~LOC~~ SOLN
70.0000 [IU] | Freq: Two times a day (BID) | SUBCUTANEOUS | Status: DC
  Filled 2013-07-14: qty 0.7

## 2013-07-14 MED ORDER — 0.9 % SODIUM CHLORIDE (POUR BTL) OPTIME
TOPICAL | Status: DC | PRN
  Administered 2013-07-14: 1000 mL

## 2013-07-14 MED ORDER — OXYCODONE HCL 5 MG PO TABS: 5.0000 mg | ORAL_TABLET | ORAL | Status: DC | PRN

## 2013-07-14 MED ORDER — ONDANSETRON HCL 4 MG PO TABS: 4.0000 mg | ORAL_TABLET | Freq: Four times a day (QID) | ORAL | Status: DC | PRN

## 2013-07-14 MED ORDER — LACTATED RINGERS IV SOLN
INTRAVENOUS | Status: DC | PRN
  Administered 2013-07-14 (×2): via INTRAVENOUS

## 2013-07-14 MED ORDER — CEFAZOLIN SODIUM 1-5 GM-% IV SOLN
1.0000 g | Freq: Four times a day (QID) | INTRAVENOUS | Status: DC
  Administered 2013-07-14: 1 g via INTRAVENOUS
  Filled 2013-07-14 (×2): qty 50

## 2013-07-14 MED ORDER — CLOPIDOGREL BISULFATE 75 MG PO TABS: 75.0000 mg | ORAL_TABLET | Freq: Every day | ORAL | Status: DC

## 2013-07-14 MED ORDER — LIDOCAINE HCL (CARDIAC) 20 MG/ML IV SOLN
INTRAVENOUS | Status: AC
  Filled 2013-07-14: qty 5

## 2013-07-14 MED ORDER — HYDROCODONE-ACETAMINOPHEN 5-325 MG PO TABS: 1.0000 | ORAL_TABLET | ORAL | Status: DC | PRN

## 2013-07-14 MED ORDER — MIDAZOLAM HCL 2 MG/2ML IJ SOLN
INTRAMUSCULAR | Status: AC
  Filled 2013-07-14: qty 2

## 2013-07-14 MED ORDER — MIDAZOLAM HCL 5 MG/5ML IJ SOLN
INTRAMUSCULAR | Status: DC | PRN
  Administered 2013-07-14: 2 mg via INTRAVENOUS

## 2013-07-14 MED ORDER — METOPROLOL SUCCINATE ER 25 MG PO TB24: 25.0000 mg | ORAL_TABLET | Freq: Every day | ORAL | Status: DC

## 2013-07-14 MED ORDER — PHENYLEPHRINE HCL 10 MG/ML IJ SOLN
INTRAMUSCULAR | Status: AC
  Filled 2013-07-14: qty 1

## 2013-07-14 MED ORDER — ZOLPIDEM TARTRATE 5 MG PO TABS: 5.0000 mg | ORAL_TABLET | Freq: Every evening | ORAL | Status: DC | PRN

## 2013-07-14 MED ORDER — ROPIVACAINE HCL 5 MG/ML IJ SOLN
Freq: Once | INTRAMUSCULAR | Status: DC
  Filled 2013-07-14 (×5): qty 30

## 2013-07-14 MED ORDER — INSULIN ASPART 100 UNIT/ML ~~LOC~~ SOLN: 0.0000 [IU] | Freq: Every day | SUBCUTANEOUS | Status: DC

## 2013-07-14 MED ORDER — PROPOFOL 10 MG/ML IV BOLUS
INTRAVENOUS | Status: AC
  Filled 2013-07-14: qty 20

## 2013-07-14 MED ORDER — LISINOPRIL-HYDROCHLOROTHIAZIDE 20-12.5 MG PO TABS: 1.0000 | ORAL_TABLET | Freq: Every day | ORAL | Status: DC

## 2013-07-14 MED ORDER — ONDANSETRON HCL 4 MG/2ML IJ SOLN: 4.0000 mg | Freq: Four times a day (QID) | INTRAMUSCULAR | Status: DC | PRN

## 2013-07-14 MED ORDER — PHENYLEPHRINE HCL 10 MG/ML IJ SOLN
INTRAMUSCULAR | Status: DC | PRN
  Administered 2013-07-14: 80 ug via INTRAVENOUS

## 2013-07-14 MED ORDER — LISINOPRIL 20 MG PO TABS: 20.0000 mg | ORAL_TABLET | Freq: Every day | ORAL | Status: DC

## 2013-07-14 MED ORDER — ASPIRIN 325 MG PO TABS
325.0000 mg | ORAL_TABLET | Freq: Every day | ORAL | Status: DC
  Filled 2013-07-14: qty 1

## 2013-07-14 MED ORDER — BUPIVACAINE-EPINEPHRINE 0.5% -1:200000 IJ SOLN
INTRAMUSCULAR | Status: DC | PRN
  Administered 2013-07-14: 20 mL

## 2013-07-14 SURGICAL SUPPLY — 37 items
BLADE CUTTER GATOR 3.5 (BLADE) ×2 IMPLANT
BLADE SURG SZ11 CARB STEEL (BLADE) ×2 IMPLANT
BUR OVAL 4.0 (BURR) ×2 IMPLANT
CANNULA ACUFO 5X76 (CANNULA) ×2 IMPLANT
CHLORAPREP W/TINT 26ML (MISCELLANEOUS) IMPLANT
DECANTER SPIKE VIAL GLASS SM (MISCELLANEOUS) ×2 IMPLANT
DRAPE ORTHO SPLIT 77X108 STRL (DRAPES)
DRAPE SHOULDER BEACH CHAIR (DRAPES) ×2 IMPLANT
DRAPE SURG ORHT 6 SPLT 77X108 (DRAPES) IMPLANT
DRAPE U-SHAPE 47X51 STRL (DRAPES) ×4 IMPLANT
DURAPREP 26ML APPLICATOR (WOUND CARE) ×2 IMPLANT
GAUZE XEROFORM 1X8 LF (GAUZE/BANDAGES/DRESSINGS) ×2 IMPLANT
GLOVE BIO SURGEON STRL SZ7.5 (GLOVE) IMPLANT
GLOVE BIOGEL PI IND STRL 8 (GLOVE) ×2 IMPLANT
GLOVE BIOGEL PI INDICATOR 8 (GLOVE) ×2
GLOVE ECLIPSE 8.0 STRL XLNG CF (GLOVE) ×4 IMPLANT
GLOVE INDICATOR 6.5 STRL GRN (GLOVE) ×2 IMPLANT
GLOVE SURG SS PI 6.5 STRL IVOR (GLOVE) ×4 IMPLANT
GOWN PREVENTION PLUS LG XLONG (DISPOSABLE) ×4 IMPLANT
GOWN PREVENTION PLUS XLARGE (GOWN DISPOSABLE) IMPLANT
GOWN STRL REIN XL XLG (GOWN DISPOSABLE) ×2 IMPLANT
KIT BASIN OR (CUSTOM PROCEDURE TRAY) ×2 IMPLANT
MANIFOLD NEPTUNE II (INSTRUMENTS) ×4 IMPLANT
NEEDLE SPNL 18GX3.5 QUINCKE PK (NEEDLE) ×2 IMPLANT
PACK SHOULDER CUSTOM OPM052 (CUSTOM PROCEDURE TRAY) ×2 IMPLANT
PAD ABD 8X10 STRL (GAUZE/BANDAGES/DRESSINGS) ×4 IMPLANT
POSITIONER SURGICAL ARM (MISCELLANEOUS) ×2 IMPLANT
SET ARTHROSCOPY TUBING (MISCELLANEOUS) ×1
SET ARTHROSCOPY TUBING LN (MISCELLANEOUS) ×1 IMPLANT
SLING ARM IMMOBILIZER LRG (SOFTGOODS) ×2 IMPLANT
SLING ARM IMMOBILIZER MED (SOFTGOODS) IMPLANT
SPONGE GAUZE 4X4 12PLY (GAUZE/BANDAGES/DRESSINGS) ×2 IMPLANT
SUT ETHILON 4 0 PS 2 18 (SUTURE) ×2 IMPLANT
SUT VIC AB 2-0 CT2 27 (SUTURE) IMPLANT
SUT VICRYL 0-0 OS 2 NEEDLE (SUTURE) ×2 IMPLANT
TUBING CONNECTING 10 (TUBING) ×2 IMPLANT
WAND 90 DEG TURBOVAC W/CORD (SURGICAL WAND) ×2 IMPLANT

## 2013-07-14 NOTE — Anesthesia Postprocedure Evaluation (Signed)
  Anesthesia Post-op Note  Patient: [name]  Procedure(s) Performed: Procedure(s) (LRB): LEFT SHOULDER ARTHROSCOPY WITH EXTENSIVE DEBRIDEMENT, DISTAL CLAVICLE REPAIR (Left)  Patient Location: PACU  Anesthesia Type: GA combined with regional for post-op pain  Level of Consciousness: awake and alert   Airway and Oxygen Therapy: Patient Spontanous Breathing  Post-op Pain: mild  Post-op Assessment: Post-op Vital signs reviewed, Patient's Cardiovascular Status Stable, Respiratory Function Stable, Patent Airway and No signs of Nausea or vomiting  Last Vitals:  Filed Vitals:   07/14/13 1030  BP: 105/50  Pulse: 75  Temp:   Resp: 16    Post-op Vital Signs: stable   Complications: No apparent anesthesia complications

## 2013-07-14 NOTE — Brief Op Note (Signed)
07/14/2013  8:54 AM  PATIENT:  [name]  57 y.o. 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  PROCEDURE:  Procedure(s): LEFT SHOULDER ARTHROSCOPY WITH EXTENSIVE DEBRIDEMENT, DISTAL CLAVICLE REPAIR (Left)  SURGEON:  Surgeon(s) and Role:    * [name], MD - Primary  PHYSICIAN ASSISTANT:   ASSISTANTS: none   ANESTHESIA:   regional and general  EBL:  Total I/O In: 1600 [I.V.:1600] Out: -   BLOOD ADMINISTERED:none  DRAINS: none   LOCAL MEDICATIONS USED:  MARCAINE     SPECIMEN:  No Specimen  DISPOSITION OF SPECIMEN:  N/A  COUNTS:  YES  TOURNIQUET:  * No tourniquets in log *  DICTATION: .Other Dictation: Dictation Number [id number]  PLAN OF CARE: Admit for overnight observation  PATIENT DISPOSITION:  PACU - hemodynamically stable.   Delay start of Pharmacological VTE agent (>24hrs) due to surgical blood loss or risk of bleeding: not applicable

## 2013-07-14 NOTE — Progress Notes (Signed)
Patient ID: [name], male   DOB: [birth year], 57 y.o.   MRN: [id number] He looks real good post-op.  His left shoulder block is working nicely.  His vitals are stable.  He can be discharged to home this evening.

## 2013-07-14 NOTE — Op Note (Signed)
[name], [name]                   ACCOUNT NO.:  [id number]  MEDICAL RECORD NO.:  [id number]  LOCATION:  [facility]                         FACILITY:  [facility]  PHYSICIAN:  Vanita Panda. Magnus Ivan, M.D.DATE OF BIRTH:  [birth year]  DATE OF PROCEDURE:  07/14/2013 DATE OF DISCHARGE:                              OPERATIVE REPORT   PREOPERATIVE DIAGNOSES:  Recurrent left shoulder inflammation with tendinosis of the rotator cuff, bursitis, and acromioclavicular joint arthropathy, and questionable rotator cuff tear.  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  PROCEDURE:  Left shoulder arthroscopy with debridement, and arthroscopic distal clavicle resection.  SURGEON:  Vanita Panda. Magnus Ivan, MD  ANESTHESIA: 1. Regional left shoulder block. 2. General.  BLOOD LOSS:  Minimal.  COMPLICATIONS:  None.  INDICATIONS:  [name] is a 57 year old poorly controlled diabetic with recurrent left shoulder pain, weakness and decreased motion.  He has been through 2 arthroscopic interventions and significant physical therapy and numerous injections and this has not helped his shoulder. We obtained a recent MRI that showed severe tendinosis again of the rotator cuff and AC joint arthropathy with downsloping acromion even though we performed a minimal acromioplasty before and it suggested a small full-thickness tear of the rotator cuff tendon.  Due to his continued pain, I even offered him another opinion for his shoulder but he wished for another intervention with the failure of all conservative treatment modalities.  PROCEDURE DESCRIPTION:  After informed consent was obtained, appropriate left shoulder was marked, anesthesia was obtained to the region of the shoulder block, he was then brought to the operating room, placed supine on the operating table.  General anesthesia was then obtained.  He was then fashioned in a  beach-chair position with appropriate positioning of the head and neck and padding of the down operative right arm.  His left shoulder was prepped and draped with ChloraPrep and sterile drapes.  He was placed in an in-line skeletal traction using a fishing pole traction device and 10 pounds of traction, neutral rotation and 45 degrees forward flexion.  Time-out was called to identify the correct patient, correct left shoulder.  I then made a posterolateral arthroscopy portal and entered the glenohumeral joint and found degenerative tearing of the anterior labrum and some mild inflamed capsular thickening anterior but I found no rotator cuff tear from the undersurface and just mild inflamed tissue.  I made an anterior portal in the rotator interval and carried out a capsular release in the front of the capsule and in the axillary recess.  I debrided the anterior labrum and the undersurfaces of inflamed tissue and the cuff itself but found really the main structures to be intact.  The cartilage was also much better and the glenohumeral joint than the MRI indicated.  The arthritic changes were very mild.  I then entered the subacromial space through the posterior portal and made a separate lateral portal.  The bursal surface of the rotator cuff showed significant thickened tissue again and some subdeltoid bursitis.  Using a soft tissue ablation wand, I carried out thinning of the rotator cuff, bursa tissue, as well as thickened tissue on  the cuff itself, but the cuff was entirely intact with internal and external rotation and probing it did not find any tear at all.  Finally, at the [facility] joint, I did have an anterior portal that I performed an arthroscopic distal clavicle resection as well as a partial acromioplasty in this area.  I then allowed fluid lavage to the shoulder and removed all instrumentation.  I closed the 3 portal sites with interrupted nylon suture, placed Xeroform and  well-padded sterile dressing, and put his arm in a sling.  He was awakened, extubated, and taken to recovery room in stable condition.  All final counts were correct and there were no complications noted.     Vanita Panda. Magnus Ivan, M.D.     CYB/MEDQ  D:  07/14/2013  T:  07/14/2013  Job:  [id number]

## 2013-07-14 NOTE — Anesthesia Preprocedure Evaluation (Signed)
Anesthesia Evaluation  Patient identified by MRN, date of birth, ID band Patient awake    Reviewed: Allergy & Precautions, H&P , NPO status , Patient's Chart, lab work & pertinent test results  Airway Mallampati: III TM Distance: <3 FB Neck ROM: Full    Dental no notable dental hx.    Pulmonary neg pulmonary ROS,  breath sounds clear to auscultation  + decreased breath sounds      Cardiovascular hypertension, + CAD, + Past MI, + CABG and + Peripheral Vascular Disease Rhythm:Regular Rate:Normal     Neuro/Psych negative neurological ROS  negative psych ROS   GI/Hepatic negative GI ROS, Neg liver ROS,   Endo/Other  diabetes, Type 1, Insulin Dependent  Renal/GU negative Renal ROS  negative genitourinary   Musculoskeletal negative musculoskeletal ROS (+)   Abdominal   Peds negative pediatric ROS (+)  Hematology negative hematology ROS (+)   Anesthesia Other Findings   Reproductive/Obstetrics negative OB ROS                           Anesthesia Physical  Anesthesia Plan  ASA: III  Anesthesia Plan: General   Post-op Pain Management:    Induction: Intravenous  Airway Management Planned: Oral ETT and Video Laryngoscope Planned  Additional Equipment:   Intra-op Plan:   Post-operative Plan: Extubation in OR  Informed Consent: I have reviewed the patients History and Physical, chart, labs and discussed the procedure including the risks, benefits and alternatives for the proposed anesthesia with the patient or authorized representative who has indicated his/her understanding and acceptance.   Dental advisory given  Plan Discussed with: CRNA and Surgeon  Anesthesia Plan Comments: (SCB)        Anesthesia Quick Evaluation

## 2013-07-14 NOTE — Anesthesia Procedure Notes (Signed)
Anesthesia Regional Block:  Supraclavicular block  Pre-Anesthetic Checklist: ,, timeout performed, Correct Patient, Correct Site, Correct Laterality, Correct Procedure, Correct Position, site marked, Risks and benefits discussed,  Surgical consent,  Pre-op evaluation,  At surgeon's request and post-op pain management  Laterality: Left and Upper  Prep: chloraprep       Needles:  Injection technique: Single-shot  Needle Type: Echogenic Needle     Needle Length: 10cm 10 cm Needle Gauge: 20 and 20 G    Additional Needles:  Procedures: ultrasound guided (picture in chart) and nerve stimulator Supraclavicular block Narrative:  Injection made incrementally with aspirations every 5 mL.  Performed by: Personally  Anesthesiologist: Phillips Grout MD  Additional Notes: Risks, benefits and alternative to block explained extensively.  Patient tolerated procedure well, without complications.

## 2013-07-14 NOTE — Transfer of Care (Signed)
Immediate Anesthesia Transfer of Care Note  Patient: [name]  Procedure(s) Performed: Procedure(s): LEFT SHOULDER ARTHROSCOPY WITH EXTENSIVE DEBRIDEMENT, DISTAL CLAVICLE REPAIR (Left)  Patient Location: PACU  Anesthesia Type:General  Level of Consciousness: sedated  Airway & Oxygen Therapy: Patient Spontanous Breathing and Patient connected to face mask oxygen  Post-op Assessment: Report given to PACU RN and Post -op Vital signs reviewed and stable  Post vital signs: Reviewed and stable  Complications: No apparent anesthesia complications

## 2013-07-14 NOTE — Discharge Summary (Signed)
Patient ID: [name] MRN: [id number] DOB/AGE: January 29, 1956 57 y.o.  Admit date: 07/14/2013 Discharge date: 07/14/2013  Admission Diagnoses:  Principal Problem:   Incomplete rotator cuff tear left shoulder Active Problems:   S/P arthroscopy of shoulder   Discharge Diagnoses:  Same  Past Medical History  Diagnosis Date  . Diabetes mellitus   . Hyperlipidemia   . Myocardial infarction 02/2001  . CAD (coronary artery disease)     OV, Dr Doristine Bosworth, MYOVIEW 5/12 on chart  EKG 10/12 EPIC,  chest x ray 01/07/11 EPIC  . Peripheral vascular disease, unspecified   . Hypertension   . Arthritis   . Pain     LEFT SHOULDER - PREVIOUS LEFT SHOULDER SURGERY MAY 2013  . Neuropathy, peripheral     both feet    Surgeries: Procedure(s): LEFT SHOULDER ARTHROSCOPY WITH EXTENSIVE DEBRIDEMENT, DISTAL CLAVICLE REPAIR on 07/14/2013   Consultants:    Discharged Condition: Improved  Hospital Course: [name] is an 57 y.o. male who was admitted 07/14/2013 for operative treatment ofIncomplete rotator cuff tear. Patient has severe unremitting pain that affects sleep, daily activities, and work/hobbies. After pre-op clearance the patient was taken to the operating room on 07/14/2013 and underwent  Procedure(s): LEFT SHOULDER ARTHROSCOPY WITH EXTENSIVE DEBRIDEMENT, DISTAL CLAVICLE REPAIR.    Patient was given perioperative antibiotics: Anti-infectives   Start     Dose/Rate Route Frequency Ordered Stop   07/14/13 1300  ceFAZolin (ANCEF) IVPB 1 g/50 mL premix     1 g 100 mL/hr over 30 Minutes Intravenous Every 6 hours 07/14/13 1225 07/15/13 0659   07/14/13 0535  ceFAZolin (ANCEF) IVPB 2 g/50 mL premix     2 g 100 mL/hr over 30 Minutes Intravenous On call to O.R. 07/14/13 0535 07/14/13 0727       Patient was given sequential compression devices, early ambulation, and chemoprophylaxis to prevent DVT.  Patient benefited maximally from hospital stay and there were no complications.    Recent  vital signs: Patient Vitals for the past 24 hrs:  BP Temp Temp src Pulse Resp SpO2 Height Weight  07/14/13 1532 118/73 mmHg - - 82 16 96 % - -  07/14/13 1429 132/72 mmHg 97.8 F (36.6 C) - 85 16 96 % - -  07/14/13 1331 123/74 mmHg 97.8 F (36.6 C) - 80 16 95 % - -  07/14/13 1300 - - - - - - 5\' 11"  (1.803 m) 111.041 kg (244 lb 12.8 oz)  07/14/13 1219 129/75 mmHg 98 F (36.7 C) - 69 - 97 % - -  07/14/13 1200 117/55 mmHg 99.2 F (37.3 C) - 72 16 99 % - -  07/14/13 1130 109/51 mmHg - - 74 18 99 % - -  07/14/13 1100 122/47 mmHg - - 74 18 99 % - -  07/14/13 1030 105/50 mmHg - - 75 16 99 % - -  07/14/13 1015 109/51 mmHg - - 72 15 100 % - -  07/14/13 1000 108/49 mmHg 98.8 F (37.1 C) - 72 15 100 % - -  07/14/13 0945 115/51 mmHg - - 72 16 99 % - -  07/14/13 0930 110/47 mmHg - - 71 14 100 % - -  07/14/13 0915 100/48 mmHg - - 80 18 100 % - -  07/14/13 0900 113/52 mmHg - - 77 17 100 % - -  07/14/13 0858 112/49 mmHg 98.3 F (36.8 C) - 75 12 100 % - -  07/14/13 0535 139/75 mmHg 97.6 F (36.4 C)  Oral 90 18 97 % - -     Recent laboratory studies: No results found for this basename: WBC, HGB, HCT, PLT, NA, K, CL, CO2, BUN, CREATININE, GLUCOSE, PT, INR, CALCIUM, 2,  in the last 72 hours   Discharge Medications:     Medication List         aspirin 325 MG tablet  Take 325 mg by mouth daily with breakfast.     clopidogrel 75 MG tablet  Commonly known as:  PLAVIX  Take 1 tablet (75 mg total) by mouth daily.     fenofibrate 145 MG tablet  Commonly known as:  TRICOR  Take 145 mg by mouth every morning.     gabapentin 300 MG capsule  Commonly known as:  NEURONTIN  Take 300 mg by mouth 3 (three) times daily.     insulin aspart 100 UNIT/ML injection  Commonly known as:  novoLOG  Inject 15-20 Units into the skin 3 (three) times daily before meals.     insulin detemir 100 UNIT/ML injection  Commonly known as:  LEVEMIR  Inject 70 Units into the skin 2 (two) times daily.      lisinopril-hydrochlorothiazide 20-12.5 MG per tablet  Commonly known as:  PRINZIDE,ZESTORETIC  Take 1 tablet by mouth daily with breakfast.     metFORMIN 500 MG 24 hr tablet  Commonly known as:  GLUCOPHAGE-XR  Take 500 mg by mouth 2 (two) times daily.     metoprolol succinate 25 MG 24 hr tablet  Commonly known as:  TOPROL-XL  TAKE 1 TABLET BY MOUTH EVERY DAY, NEEDS OFFICE VISIT     oxyCODONE 5 MG immediate release tablet  Commonly known as:  Oxy IR/ROXICODONE  Take 1-2 tablets (5-10 mg total) by mouth every 4 (four) hours as needed for severe pain or breakthrough pain.     simvastatin 80 MG tablet  Commonly known as:  ZOCOR  Take 80 mg by mouth at bedtime.        Diagnostic Studies: Dg Chest 2 View  07/06/2013   CLINICAL DATA:  Preoperative for left shoulder arthroscopy, history of diabetes and hypertension and CABG  EXAM: CHEST  2 VIEW  COMPARISON:  July 05, 2012.  [name]  FINDINGS: The lungs are reasonably well inflated. There is no focal infiltrate. There is no pleural effusion or pneumothorax. The mediastinum is normal in width. The cardiopericardial silhouette is normal in size. The pulmonary vascularity is not engorged. The patient has undergone previous median sternotomy and CABG. There are 6 intact sternal wires demonstrated. Mild degenerative disc change of the mid thoracic spine is demonstrated. There is calcification of portions of the anterior longitudinal ligament.  IMPRESSION: There is no evidence of pneumonia nor CHF nor other acute cardiopulmonary abnormality.   Electronically Signed   By: David  [place]   On: 07/06/2013 15:31    Disposition: 01-Home or Self Care      Discharge Orders   Future Appointments Provider Department Dept Phone   08/01/2013 9:00 AM Runell Gess, MD [facility] [phone]   11/16/2013 12:30 PM [facility] [address] [phone]   11/16/2013 1:15 PM Sherren Kerns, MD [facility] [phone]   Future Orders Complete By Expires   Call MD / Call 911  As directed    Comments:     If you experience chest pain or shortness of breath, CALL 911 and be transported to the hospital emergency room.  If you  develope a fever above 101 F, pus (white drainage) or increased drainage or redness at the wound, or calf pain, call your surgeon's office.   Constipation Prevention  As directed    Comments:     Drink plenty of fluids.  Prune juice may be helpful.  You may use a stool softener, such as Colace (over the counter) 100 mg twice a day.  Use MiraLax (over the counter) for constipation as needed.   Diet - low sodium heart healthy  As directed    Discharge instructions  As directed    Comments:     Increase activity as comfort allows. Ice as needed for swelling. You can remove your dressing 12/27 and get your incisions wet in the shower. Daily band-aids over your incisions. Move your shoulder as much as comfort allows.   Discharge patient  As directed    Increase activity slowly as tolerated  As directed       Follow-up Information   Follow up with [name], MD. Schedule an appointment as soon as possible for a visit in 10 days.   Specialty:  Orthopedic Surgery   Contact information:   [address] [place] [address] [phone]        Signed: [name] 07/14/2013, 4:31 PM

## 2013-07-14 NOTE — H&P (Signed)
[name] is an 57 y.o. male.   Chief Complaint:   Left shoulder pain, weakness, decreased motion HPI:   57 yo diabetic male with recurrent left should pain, decreased motion, and weakness.  He has had two previous shoulder scopes, but continues to have signs of arthrofibrosis and increased weakness as well as severe pain.  He wishes to return to the OR today for a repeat arthroscopy.  A recent MRI does show signs of capsulitis in his shoulder as well as a new rotator cuff tear.  Past Medical History  Diagnosis Date  . Diabetes mellitus   . Hyperlipidemia   . Myocardial infarction 02/2001  . CAD (coronary artery disease)     OV, Dr Doristine Bosworth, MYOVIEW 5/12 on chart  EKG 10/12 EPIC,  chest x ray 01/07/11 EPIC  . Peripheral vascular disease, unspecified   . Hypertension   . Arthritis   . Pain     LEFT SHOULDER - PREVIOUS LEFT SHOULDER SURGERY MAY 2013  . Neuropathy, peripheral     both feet    Past Surgical History  Procedure Laterality Date  . Cholecystectomy    . Appendectomy    . Knee surgery        arthroscopy  left x 2  . Carpal tunnel release      right  . Shoulder surgery      right arthroscopy  . Femoral-tibial bypass graft  01/07/11    Left fem-posterior tibial BPG using reversed left GSV               12/15/11 OK BY DR Magnus Ivan TO CONTINUE ASA AND PLAVIX  . Cardiac catheterization  2006  . Fracture surgery      left  radius/ulna  . Popiteal artery stent  2011    x 4    LEFT/ s/p thrombectomy  . Carpal tunnel release  12/18/2011    Procedure: CARPAL TUNNEL RELEASE;  Surgeon: [name], MD;  Location: [facility];  Service: Orthopedics;  Laterality: Left;  Left Open Carpal Tunnel Release  . Left shoulder surgery   12/18/2011  . Shoulder arthroscopy  07/08/2012    Procedure: ARTHROSCOPY SHOULDER;  Surgeon: [name], MD;  Location: [facility];  Service: Orthopedics;  Laterality: Left;  Left Shoulder Arthroscopy with Manipulation and Extensive Debridement   . Coronary artery bypass graft  2002    x 4  . Cystoscopy      several done in past  . Lithotrispy      several done in past    Family History  Problem Relation Age of Onset  . Cancer Mother   . Cancer Father    Social History:  reports that he has never smoked. He has never used smokeless tobacco. He reports that he does not drink alcohol or use illicit drugs.  Allergies: No Known Allergies  Medications Prior to Admission  Medication Sig Dispense Refill  . fenofibrate (TRICOR) 145 MG tablet Take 145 mg by mouth every morning.      . gabapentin (NEURONTIN) 300 MG capsule Take 300 mg by mouth 3 (three) times daily.       . insulin aspart (NOVOLOG) 100 UNIT/ML injection Inject 15-20 Units into the skin 3 (three) times daily before meals.      . insulin detemir (LEVEMIR) 100 UNIT/ML injection Inject 70 Units into the skin 2 (two) times daily.      [name] lisinopril-hydrochlorothiazide (PRINZIDE,ZESTORETIC) 20-12.5 MG per tablet Take 1 tablet by mouth daily  with breakfast.       . metFORMIN (GLUCOPHAGE-XR) 500 MG 24 hr tablet Take 500 mg by mouth 2 (two) times daily.      . metoprolol succinate (TOPROL-XL) 25 MG 24 hr tablet TAKE 1 TABLET BY MOUTH EVERY DAY, NEEDS OFFICE VISIT  90 tablet  0  . simvastatin (ZOCOR) 80 MG tablet Take 80 mg by mouth at bedtime.      [name] aspirin 325 MG tablet Take 325 mg by mouth daily with breakfast.       . clopidogrel (PLAVIX) 75 MG tablet Take 1 tablet (75 mg total) by mouth daily.  90 tablet  0    Results for orders placed during the hospital encounter of 07/14/13 (from the past 48 hour(s))  GLUCOSE, CAPILLARY     Status: Abnormal   Collection Time    07/14/13  5:49 AM      Result Value Range   Glucose-Capillary 240 (*) 70 - 99 mg/dL   Comment 1 Documented in Chart     No results found.  Review of Systems  All other systems reviewed and are negative.    Blood pressure 139/75, pulse 90, temperature 97.6 F (36.4 C), temperature source Oral, resp.  rate 18, SpO2 97.00%. Physical Exam  Constitutional: He is oriented to person, place, and time. He appears well-developed and well-nourished.  HENT:  Head: Normocephalic and atraumatic.  Eyes: EOM are normal. Pupils are equal, round, and reactive to light.  Neck: Normal range of motion. Neck supple.  Cardiovascular: Normal rate and regular rhythm.   Respiratory: Effort normal and breath sounds normal.  GI: Soft. Bowel sounds are normal.  Musculoskeletal:       Left shoulder: He exhibits decreased range of motion, bony tenderness and decreased strength.  Neurological: He is alert and oriented to person, place, and time.  Skin: Skin is warm and dry.  Psychiatric: He has a normal mood and affect.     Assessment/Plan Left shoulder adhesive capsulitis and rotator cuff tear 1)   To the OR today for a repeat left shoulder arthroscopy.  Shaleigh Laubscher Y 07/14/2013, 7:21 AM

## 2013-07-17 ENCOUNTER — Encounter (HOSPITAL_COMMUNITY): Payer: Self-pay | Admitting: Orthopaedic Surgery

## 2013-08-01 ENCOUNTER — Encounter: Payer: Self-pay | Admitting: Cardiovascular Disease

## 2013-08-01 ENCOUNTER — Ambulatory Visit (INDEPENDENT_AMBULATORY_CARE_PROVIDER_SITE_OTHER): Payer: BC Managed Care – PPO | Admitting: Cardiovascular Disease

## 2013-08-01 VITALS — BP 120/82 | HR 91 | Ht 71.0 in | Wt 239.0 lb

## 2013-08-01 DIAGNOSIS — E785 Hyperlipidemia, unspecified: Secondary | ICD-10-CM | POA: Insufficient documentation

## 2013-08-01 DIAGNOSIS — I2510 Atherosclerotic heart disease of native coronary artery without angina pectoris: Secondary | ICD-10-CM | POA: Insufficient documentation

## 2013-08-01 DIAGNOSIS — I739 Peripheral vascular disease, unspecified: Secondary | ICD-10-CM

## 2013-08-01 DIAGNOSIS — I10 Essential (primary) hypertension: Secondary | ICD-10-CM | POA: Insufficient documentation

## 2013-08-01 NOTE — Assessment & Plan Note (Signed)
History of coronary artery bypass grafting x4 after a myocardial infarction that occurred back in 2002 his last cardiac catheterization occurred 01/15/05 by Dr. Jaci Standard revealing an occluded into an obtuse marginal branch as well as critical disease in the LAD distal to the left internal mammary artery which apparently was intervened upon by Dr. Ilda Foil. He denies chest pain or shortness of breath. He had a Myoview stress test performed in August of last year which was nonischemic done prior to orthopedic surgery

## 2013-08-01 NOTE — Patient Instructions (Signed)
Your physician wants you to follow-up in: 1 year with Dr Berry. You will receive a reminder letter in the mail two months in advance. If you don't receive a letter, please call our office to schedule the follow-up appointment.  

## 2013-08-01 NOTE — Assessment & Plan Note (Signed)
Status post multiple interventions of the left lower extremity the most recent performed by myself recanalizing an occluded distal SFA with a Viabahn stent graft 07/08/2010. Was ultimately closed and the patient underwent above to below the knee bypass grafting by Dr. Juanda Crumble fields

## 2013-08-01 NOTE — Assessment & Plan Note (Signed)
On statin therapy followed by his PCP 

## 2013-08-01 NOTE — Progress Notes (Signed)
08/01/2013 [name]   06/30/1956  [id number]  Primary Physician Lorretta Harp, MD Primary Cardiologist: Lorretta Harp MD Renae Gloss   HPI:  The patient returns today for followup. He was last seen 10/16/13He is a 58 year old, moderately overweight, widowed Caucasian male, father of 31, grandfather to 3 grandchildren who has a history of ischemic heart disease status post MI back in 2002 with bypass grafting x4 at that time. He has had occluded graft by cath and has had EECP in the past. His other problems include remote tobacco abuse, diabetes with peripheral neuropathy, dyslipidemia and hypertension. He also has peripheral vascular occlusive disease. I stented his left lower extremity several times, the most recent with a Viabahn endoprosthesis with an excellent result. This ultimated closed probably because of his failure to continue his Plavix. He ultimately underwent above-to-below-the-knee bypass grafting by Dr. Ruta Hinds which eventually closed as well. Dr. Oneida Alar recently did Dopplers on him revealing a right ABI of 1.03 and a left of 0.66. His last Myoview performed Dec 12, 2011, revealed inferolateral thinning towards the apex. He is otherwise asymptomatic.his lipids are followed by Dr. Chalmers Cater, his endocrinologist. He did have a negative Myoview in August of last year prior to orthopedic surgery. He denies chest pain or shortness of breath.    Current Outpatient Prescriptions  Medication Sig Dispense Refill  . aspirin 325 MG tablet Take 325 mg by mouth daily with breakfast.       . clopidogrel (PLAVIX) 75 MG tablet Take 1 tablet (75 mg total) by mouth daily.  90 tablet  0  . doxycycline (VIBRA-TABS) 100 MG tablet Take 100 mg by mouth 2 (two) times daily.      . fenofibrate (TRICOR) 145 MG tablet Take 145 mg by mouth every morning.      . gabapentin (NEURONTIN) 300 MG capsule Take 300 mg by mouth 3 (three) times daily.       . insulin aspart (NOVOLOG) 100  UNIT/ML injection Inject 15-20 Units into the skin 3 (three) times daily before meals.      . insulin detemir (LEVEMIR) 100 UNIT/ML injection Inject 70 Units into the skin 2 (two) times daily.      [name] lisinopril-hydrochlorothiazide (PRINZIDE,ZESTORETIC) 20-12.5 MG per tablet Take 1 tablet by mouth daily with breakfast.       . metFORMIN (GLUCOPHAGE-XR) 500 MG 24 hr tablet Take 500 mg by mouth 2 (two) times daily.      . metoprolol succinate (TOPROL-XL) 25 MG 24 hr tablet TAKE 1 TABLET BY MOUTH EVERY DAY, NEEDS OFFICE VISIT  90 tablet  0  . oxyCODONE (OXY IR/ROXICODONE) 5 MG immediate release tablet Take 1-2 tablets (5-10 mg total) by mouth every 4 (four) hours as needed for severe pain or breakthrough pain.  90 tablet  0  . simvastatin (ZOCOR) 80 MG tablet Take 80 mg by mouth at bedtime.       No current facility-administered medications for this visit.    No Known Allergies  History   Social History  . Marital Status: Single    Spouse Name: N/A    Number of Children: N/A  . Years of Education: N/A   Occupational History  . Not on file.   Social History Main Topics  . Smoking status: Never Smoker   . Smokeless tobacco: Never Used  . Alcohol Use: No  . Drug Use: No  . Sexual Activity: Not on file   Other Topics Concern  . Not  on file   Social History Narrative  . No narrative on file     Review of Systems: General: negative for chills, fever, night sweats or weight changes.  Cardiovascular: negative for chest pain, dyspnea on exertion, edema, orthopnea, palpitations, paroxysmal nocturnal dyspnea or shortness of breath Dermatological: negative for rash Respiratory: negative for cough or wheezing Urologic: negative for hematuria Abdominal: negative for nausea, vomiting, diarrhea, bright red blood per rectum, melena, or hematemesis Neurologic: negative for visual changes, syncope, or dizziness All other systems reviewed and are otherwise negative except as noted  above.    Blood pressure 120/82, pulse 91, height 5\' 11"  (1.803 m), weight 239 lb (108.41 kg).  General appearance: alert and no distress Neck: no adenopathy, no carotid bruit, no JVD, supple, symmetrical, trachea midline and thyroid not enlarged, symmetric, no tenderness/mass/nodules Lungs: clear to auscultation bilaterally Heart: regular rate and rhythm, S1, S2 normal, no murmur, click, rub or gallop Extremities: extremities normal, atraumatic, no cyanosis or edema and 11+ left pedal pulse  EKG normal sinus rhythm at 91 with poor progression  ASSESSMENT AND PLAN:   Coronary artery disease History of coronary artery bypass grafting x4 after a myocardial infarction that occurred back in 2002 his last cardiac catheterization occurred 01/15/05 by Dr. Jaci Standard revealing an occluded into an obtuse marginal branch as well as critical disease in the LAD distal to the left internal mammary artery which apparently was intervened upon by Dr. Ilda Foil. He denies chest pain or shortness of breath. He had a Myoview stress test performed in August of last year which was nonischemic done prior to orthopedic surgery  Essential hypertension Controlled on current medications  Hyperlipidemia On statin therapy followed by his PCP  PAD (peripheral artery disease) Status post multiple interventions of the left lower extremity the most recent performed by myself recanalizing an occluded distal SFA with a Viabahn stent graft 07/08/2010. Was ultimately closed and the patient underwent above to below the knee bypass grafting by Dr. Juanda Crumble fields      Lorretta Harp MD [facility], [facility] 08/01/2013 9:42 AM

## 2013-08-01 NOTE — Assessment & Plan Note (Signed)
Controlled on current medications 

## 2013-08-18 ENCOUNTER — Ambulatory Visit (INDEPENDENT_AMBULATORY_CARE_PROVIDER_SITE_OTHER): Payer: BC Managed Care – PPO | Admitting: Physical Therapy

## 2013-08-18 DIAGNOSIS — M25519 Pain in unspecified shoulder: Secondary | ICD-10-CM

## 2013-08-18 DIAGNOSIS — M25619 Stiffness of unspecified shoulder, not elsewhere classified: Secondary | ICD-10-CM

## 2013-08-18 DIAGNOSIS — M7500 Adhesive capsulitis of unspecified shoulder: Secondary | ICD-10-CM

## 2013-08-21 ENCOUNTER — Encounter (INDEPENDENT_AMBULATORY_CARE_PROVIDER_SITE_OTHER): Payer: BC Managed Care – PPO | Admitting: Physical Therapy

## 2013-08-21 DIAGNOSIS — M7500 Adhesive capsulitis of unspecified shoulder: Secondary | ICD-10-CM

## 2013-08-21 DIAGNOSIS — M25519 Pain in unspecified shoulder: Secondary | ICD-10-CM

## 2013-08-21 DIAGNOSIS — M25619 Stiffness of unspecified shoulder, not elsewhere classified: Secondary | ICD-10-CM

## 2013-08-23 ENCOUNTER — Encounter (INDEPENDENT_AMBULATORY_CARE_PROVIDER_SITE_OTHER): Payer: BC Managed Care – PPO | Admitting: Physical Therapy

## 2013-08-23 DIAGNOSIS — M25619 Stiffness of unspecified shoulder, not elsewhere classified: Secondary | ICD-10-CM

## 2013-08-23 DIAGNOSIS — M7500 Adhesive capsulitis of unspecified shoulder: Secondary | ICD-10-CM

## 2013-08-23 DIAGNOSIS — M25519 Pain in unspecified shoulder: Secondary | ICD-10-CM

## 2013-08-24 ENCOUNTER — Encounter (INDEPENDENT_AMBULATORY_CARE_PROVIDER_SITE_OTHER): Payer: BC Managed Care – PPO

## 2013-08-24 DIAGNOSIS — M7500 Adhesive capsulitis of unspecified shoulder: Secondary | ICD-10-CM

## 2013-08-24 DIAGNOSIS — M25619 Stiffness of unspecified shoulder, not elsewhere classified: Secondary | ICD-10-CM

## 2013-08-24 DIAGNOSIS — M25519 Pain in unspecified shoulder: Secondary | ICD-10-CM

## 2013-08-28 ENCOUNTER — Encounter (INDEPENDENT_AMBULATORY_CARE_PROVIDER_SITE_OTHER): Payer: BC Managed Care – PPO

## 2013-08-28 DIAGNOSIS — M25519 Pain in unspecified shoulder: Secondary | ICD-10-CM

## 2013-08-28 DIAGNOSIS — M7500 Adhesive capsulitis of unspecified shoulder: Secondary | ICD-10-CM

## 2013-08-28 DIAGNOSIS — M25619 Stiffness of unspecified shoulder, not elsewhere classified: Secondary | ICD-10-CM

## 2013-08-30 ENCOUNTER — Encounter: Payer: BC Managed Care – PPO | Admitting: Physical Therapy

## 2013-09-01 ENCOUNTER — Encounter (INDEPENDENT_AMBULATORY_CARE_PROVIDER_SITE_OTHER): Payer: BC Managed Care – PPO | Admitting: Physical Therapy

## 2013-09-01 DIAGNOSIS — M25619 Stiffness of unspecified shoulder, not elsewhere classified: Secondary | ICD-10-CM

## 2013-09-01 DIAGNOSIS — M7500 Adhesive capsulitis of unspecified shoulder: Secondary | ICD-10-CM

## 2013-09-01 DIAGNOSIS — M25519 Pain in unspecified shoulder: Secondary | ICD-10-CM

## 2013-09-04 ENCOUNTER — Encounter (INDEPENDENT_AMBULATORY_CARE_PROVIDER_SITE_OTHER): Payer: BC Managed Care – PPO | Admitting: Physical Therapy

## 2013-09-04 DIAGNOSIS — M24519 Contracture, unspecified shoulder: Secondary | ICD-10-CM

## 2013-09-04 DIAGNOSIS — M7500 Adhesive capsulitis of unspecified shoulder: Secondary | ICD-10-CM

## 2013-09-04 DIAGNOSIS — M25519 Pain in unspecified shoulder: Secondary | ICD-10-CM

## 2013-09-04 DIAGNOSIS — M25619 Stiffness of unspecified shoulder, not elsewhere classified: Secondary | ICD-10-CM

## 2013-09-06 ENCOUNTER — Encounter: Payer: BC Managed Care – PPO | Admitting: Physical Therapy

## 2013-09-08 ENCOUNTER — Encounter (INDEPENDENT_AMBULATORY_CARE_PROVIDER_SITE_OTHER): Payer: BC Managed Care – PPO | Admitting: Physical Therapy

## 2013-09-08 DIAGNOSIS — M4802 Spinal stenosis, cervical region: Secondary | ICD-10-CM

## 2013-09-08 DIAGNOSIS — M25619 Stiffness of unspecified shoulder, not elsewhere classified: Secondary | ICD-10-CM

## 2013-09-08 DIAGNOSIS — M25519 Pain in unspecified shoulder: Secondary | ICD-10-CM

## 2013-09-11 ENCOUNTER — Encounter (INDEPENDENT_AMBULATORY_CARE_PROVIDER_SITE_OTHER): Payer: BC Managed Care – PPO | Admitting: Physical Therapy

## 2013-09-11 DIAGNOSIS — M25519 Pain in unspecified shoulder: Secondary | ICD-10-CM

## 2013-09-11 DIAGNOSIS — M7500 Adhesive capsulitis of unspecified shoulder: Secondary | ICD-10-CM

## 2013-09-11 DIAGNOSIS — M25619 Stiffness of unspecified shoulder, not elsewhere classified: Secondary | ICD-10-CM

## 2013-09-13 ENCOUNTER — Encounter (INDEPENDENT_AMBULATORY_CARE_PROVIDER_SITE_OTHER): Payer: BC Managed Care – PPO

## 2013-09-13 DIAGNOSIS — M25619 Stiffness of unspecified shoulder, not elsewhere classified: Secondary | ICD-10-CM

## 2013-09-13 DIAGNOSIS — M25519 Pain in unspecified shoulder: Secondary | ICD-10-CM

## 2013-09-13 DIAGNOSIS — M7500 Adhesive capsulitis of unspecified shoulder: Secondary | ICD-10-CM

## 2013-09-15 ENCOUNTER — Encounter (INDEPENDENT_AMBULATORY_CARE_PROVIDER_SITE_OTHER): Payer: BC Managed Care – PPO | Admitting: Physical Therapy

## 2013-09-15 DIAGNOSIS — M25519 Pain in unspecified shoulder: Secondary | ICD-10-CM

## 2013-09-15 DIAGNOSIS — M25619 Stiffness of unspecified shoulder, not elsewhere classified: Secondary | ICD-10-CM

## 2013-09-15 DIAGNOSIS — M7500 Adhesive capsulitis of unspecified shoulder: Secondary | ICD-10-CM

## 2013-09-18 ENCOUNTER — Encounter (INDEPENDENT_AMBULATORY_CARE_PROVIDER_SITE_OTHER): Payer: BC Managed Care – PPO | Admitting: Physical Therapy

## 2013-09-18 ENCOUNTER — Other Ambulatory Visit: Payer: Self-pay | Admitting: Cardiovascular Disease

## 2013-09-18 DIAGNOSIS — M25519 Pain in unspecified shoulder: Secondary | ICD-10-CM

## 2013-09-18 DIAGNOSIS — M25619 Stiffness of unspecified shoulder, not elsewhere classified: Secondary | ICD-10-CM

## 2013-09-18 DIAGNOSIS — M7500 Adhesive capsulitis of unspecified shoulder: Secondary | ICD-10-CM

## 2013-09-18 NOTE — Telephone Encounter (Signed)
Rx was sent to pharmacy electronically. 

## 2013-09-21 ENCOUNTER — Encounter (INDEPENDENT_AMBULATORY_CARE_PROVIDER_SITE_OTHER): Payer: BC Managed Care – PPO | Admitting: Physical Therapy

## 2013-09-21 DIAGNOSIS — M25619 Stiffness of unspecified shoulder, not elsewhere classified: Secondary | ICD-10-CM

## 2013-09-21 DIAGNOSIS — M7500 Adhesive capsulitis of unspecified shoulder: Secondary | ICD-10-CM

## 2013-09-21 DIAGNOSIS — M25519 Pain in unspecified shoulder: Secondary | ICD-10-CM

## 2013-09-25 ENCOUNTER — Encounter (INDEPENDENT_AMBULATORY_CARE_PROVIDER_SITE_OTHER): Payer: BC Managed Care – PPO | Admitting: Physical Therapy

## 2013-09-25 DIAGNOSIS — M25519 Pain in unspecified shoulder: Secondary | ICD-10-CM

## 2013-09-25 DIAGNOSIS — M7500 Adhesive capsulitis of unspecified shoulder: Secondary | ICD-10-CM

## 2013-09-25 DIAGNOSIS — M25619 Stiffness of unspecified shoulder, not elsewhere classified: Secondary | ICD-10-CM

## 2013-09-28 ENCOUNTER — Encounter (INDEPENDENT_AMBULATORY_CARE_PROVIDER_SITE_OTHER): Payer: BC Managed Care – PPO | Admitting: Physical Therapy

## 2013-09-28 DIAGNOSIS — M25519 Pain in unspecified shoulder: Secondary | ICD-10-CM

## 2013-09-28 DIAGNOSIS — M7500 Adhesive capsulitis of unspecified shoulder: Secondary | ICD-10-CM

## 2013-09-28 DIAGNOSIS — M25619 Stiffness of unspecified shoulder, not elsewhere classified: Secondary | ICD-10-CM

## 2013-10-02 ENCOUNTER — Encounter (INDEPENDENT_AMBULATORY_CARE_PROVIDER_SITE_OTHER): Payer: BC Managed Care – PPO | Admitting: Physical Therapy

## 2013-10-02 DIAGNOSIS — M7500 Adhesive capsulitis of unspecified shoulder: Secondary | ICD-10-CM

## 2013-10-02 DIAGNOSIS — M25676 Stiffness of unspecified foot, not elsewhere classified: Secondary | ICD-10-CM

## 2013-10-02 DIAGNOSIS — M25673 Stiffness of unspecified ankle, not elsewhere classified: Secondary | ICD-10-CM

## 2013-10-02 DIAGNOSIS — M25519 Pain in unspecified shoulder: Secondary | ICD-10-CM

## 2013-10-04 ENCOUNTER — Encounter (INDEPENDENT_AMBULATORY_CARE_PROVIDER_SITE_OTHER): Payer: BC Managed Care – PPO

## 2013-10-04 DIAGNOSIS — M7500 Adhesive capsulitis of unspecified shoulder: Secondary | ICD-10-CM

## 2013-10-04 DIAGNOSIS — M25519 Pain in unspecified shoulder: Secondary | ICD-10-CM

## 2013-10-04 DIAGNOSIS — M25619 Stiffness of unspecified shoulder, not elsewhere classified: Secondary | ICD-10-CM

## 2013-10-05 ENCOUNTER — Other Ambulatory Visit: Payer: Self-pay

## 2013-10-05 MED ORDER — METOPROLOL SUCCINATE ER 25 MG PO TB24: 25.0000 mg | ORAL_TABLET | Freq: Every day | ORAL | Status: DC

## 2013-10-05 NOTE — Telephone Encounter (Signed)
Rx was sent to pharmacy electronically. 

## 2013-10-06 ENCOUNTER — Encounter (INDEPENDENT_AMBULATORY_CARE_PROVIDER_SITE_OTHER): Payer: BC Managed Care – PPO | Admitting: Physical Therapy

## 2013-10-06 DIAGNOSIS — M7500 Adhesive capsulitis of unspecified shoulder: Secondary | ICD-10-CM

## 2013-10-06 DIAGNOSIS — M25519 Pain in unspecified shoulder: Secondary | ICD-10-CM

## 2013-10-06 DIAGNOSIS — M25619 Stiffness of unspecified shoulder, not elsewhere classified: Secondary | ICD-10-CM

## 2013-10-11 ENCOUNTER — Encounter (INDEPENDENT_AMBULATORY_CARE_PROVIDER_SITE_OTHER): Payer: BC Managed Care – PPO | Admitting: Physical Therapy

## 2013-10-11 DIAGNOSIS — M7500 Adhesive capsulitis of unspecified shoulder: Secondary | ICD-10-CM

## 2013-10-11 DIAGNOSIS — M25519 Pain in unspecified shoulder: Secondary | ICD-10-CM

## 2013-10-11 DIAGNOSIS — M25619 Stiffness of unspecified shoulder, not elsewhere classified: Secondary | ICD-10-CM

## 2013-10-13 ENCOUNTER — Encounter (INDEPENDENT_AMBULATORY_CARE_PROVIDER_SITE_OTHER): Payer: BC Managed Care – PPO | Admitting: Physical Therapy

## 2013-10-13 DIAGNOSIS — R293 Abnormal posture: Secondary | ICD-10-CM

## 2013-10-13 DIAGNOSIS — M545 Low back pain, unspecified: Secondary | ICD-10-CM

## 2013-10-13 DIAGNOSIS — M6281 Muscle weakness (generalized): Secondary | ICD-10-CM

## 2013-10-16 ENCOUNTER — Encounter (INDEPENDENT_AMBULATORY_CARE_PROVIDER_SITE_OTHER): Payer: BC Managed Care – PPO | Admitting: Physical Therapy

## 2013-10-16 DIAGNOSIS — M25519 Pain in unspecified shoulder: Secondary | ICD-10-CM

## 2013-10-16 DIAGNOSIS — M25619 Stiffness of unspecified shoulder, not elsewhere classified: Secondary | ICD-10-CM

## 2013-10-16 DIAGNOSIS — M7500 Adhesive capsulitis of unspecified shoulder: Secondary | ICD-10-CM

## 2013-10-17 ENCOUNTER — Other Ambulatory Visit: Payer: Self-pay | Admitting: *Deleted

## 2013-10-17 MED ORDER — CLOPIDOGREL BISULFATE 75 MG PO TABS: 75.0000 mg | ORAL_TABLET | Freq: Every day | ORAL | Status: DC

## 2013-10-17 NOTE — Telephone Encounter (Signed)
Rx refill sent to patients pharmacy  

## 2013-10-23 ENCOUNTER — Encounter (INDEPENDENT_AMBULATORY_CARE_PROVIDER_SITE_OTHER): Payer: BC Managed Care – PPO | Admitting: Physical Therapy

## 2013-10-23 DIAGNOSIS — M25619 Stiffness of unspecified shoulder, not elsewhere classified: Secondary | ICD-10-CM

## 2013-10-23 DIAGNOSIS — M25519 Pain in unspecified shoulder: Secondary | ICD-10-CM

## 2013-10-23 DIAGNOSIS — M7500 Adhesive capsulitis of unspecified shoulder: Secondary | ICD-10-CM

## 2013-11-16 ENCOUNTER — Ambulatory Visit: Payer: BC Managed Care – PPO | Admitting: Vascular Surgery

## 2013-11-16 ENCOUNTER — Encounter (HOSPITAL_COMMUNITY): Payer: BC Managed Care – PPO

## 2013-12-06 ENCOUNTER — Encounter: Payer: Self-pay | Admitting: Vascular Surgery

## 2013-12-07 ENCOUNTER — Ambulatory Visit (HOSPITAL_COMMUNITY)
Admission: RE | Admit: 2013-12-07 | Discharge: 2013-12-07 | Disposition: A | Discharge status: Home / Self Care | Payer: BC Managed Care – PPO | Source: Ambulatory Visit | Attending: Vascular Surgery | Admitting: Vascular Surgery

## 2013-12-07 ENCOUNTER — Encounter: Payer: Self-pay | Admitting: Vascular Surgery

## 2013-12-07 ENCOUNTER — Ambulatory Visit (INDEPENDENT_AMBULATORY_CARE_PROVIDER_SITE_OTHER): Payer: BC Managed Care – PPO | Admitting: Vascular Surgery

## 2013-12-07 VITALS — BP 134/73 | HR 73 | Ht 71.0 in | Wt 237.0 lb

## 2013-12-07 DIAGNOSIS — I739 Peripheral vascular disease, unspecified: Secondary | ICD-10-CM

## 2013-12-07 DIAGNOSIS — Z48812 Encounter for surgical aftercare following surgery on the circulatory system: Secondary | ICD-10-CM | POA: Insufficient documentation

## 2013-12-07 NOTE — Progress Notes (Signed)
Patient is a 58 year old male who returns for followup today. He has known peripheral arterial disease. He previously underwent multiple peripheral interventions in his lower extremities by Dr. Gwenlyn Found. He ultimately ended up with a left leg bypass which failed early. He has previously had vein harvested from the right leg in the left leg. He denies rest pain. He can walk greater than 1 mile currently without claudication symptoms. He is not smoking. He has changed his diet significantly to try to improve his diabetes. I congratulated him today on many positive for lifestyle changes. He does get some shortness of breath at a mile and this occurs certainly before any claudication symptoms.  Review of systems: Dyspnea with exertion as mentioned above. He denies chest pain.  Physical exam:  Filed Vitals:   12/07/13 1104  BP: 134/73  Pulse: 73  Height: 5\' 11"  (1.803 m)  Weight: 237 lb (107.502 kg)  SpO2: 98%   Neck: No carotid bruits  Chest: Clear to auscultation bilaterally  Cardiac: Regular rate and rhythm without murmur  Abdomen: Soft nontender slightly obese  Chimneys: 2+ femoral pulses bilaterally with absent popliteal and pedal pulses  Skin: No ulcers  Patient had bilateral ABIs today which I reviewed and interpreted. ABI on the right was 1.01 left was 0.74   Assessment: Stable peripheral arterial disease bilateral lower extremities  Plan: Follow up one year with repeat ABIs  Ruta Hinds, MD [facility] [place] Office: [phone] Pager: [phone]

## 2013-12-07 NOTE — Addendum Note (Signed)
Addended by: Mena Goes on: 12/07/2013 02:02 PM   Modules accepted: Orders

## 2014-01-23 ENCOUNTER — Other Ambulatory Visit: Payer: Self-pay | Admitting: Cardiovascular Disease

## 2014-01-23 NOTE — Telephone Encounter (Signed)
Rx refill sent to patient pharmacy   

## 2014-08-10 ENCOUNTER — Other Ambulatory Visit: Payer: Self-pay | Admitting: Cardiovascular Disease

## 2014-11-06 ENCOUNTER — Other Ambulatory Visit: Payer: Self-pay | Admitting: Cardiovascular Disease

## 2014-11-06 NOTE — Telephone Encounter (Signed)
Rx refill sent to patient pharmacy   

## 2014-11-30 ENCOUNTER — Other Ambulatory Visit: Payer: Self-pay | Admitting: Cardiovascular Disease

## 2014-12-12 ENCOUNTER — Encounter: Payer: Self-pay | Admitting: Family

## 2014-12-13 ENCOUNTER — Ambulatory Visit (INDEPENDENT_AMBULATORY_CARE_PROVIDER_SITE_OTHER): Payer: BLUE CROSS/BLUE SHIELD | Admitting: Family

## 2014-12-13 ENCOUNTER — Encounter: Payer: Self-pay | Admitting: Family

## 2014-12-13 ENCOUNTER — Ambulatory Visit (HOSPITAL_COMMUNITY)
Admission: RE | Admit: 2014-12-13 | Discharge: 2014-12-13 | Disposition: A | Discharge status: Home / Self Care | Payer: BLUE CROSS/BLUE SHIELD | Source: Ambulatory Visit | Attending: Vascular Surgery | Admitting: Vascular Surgery

## 2014-12-13 VITALS — BP 122/74 | HR 83 | Resp 16 | Ht 71.0 in | Wt 241.0 lb

## 2014-12-13 DIAGNOSIS — I779 Disorder of arteries and arterioles, unspecified: Secondary | ICD-10-CM

## 2014-12-13 DIAGNOSIS — E1159 Type 2 diabetes mellitus with other circulatory complications: Secondary | ICD-10-CM

## 2014-12-13 DIAGNOSIS — I739 Peripheral vascular disease, unspecified: Secondary | ICD-10-CM

## 2014-12-13 DIAGNOSIS — R252 Cramp and spasm: Secondary | ICD-10-CM | POA: Insufficient documentation

## 2014-12-13 DIAGNOSIS — Z87891 Personal history of nicotine dependence: Secondary | ICD-10-CM | POA: Diagnosis not present

## 2014-12-13 DIAGNOSIS — M25569 Pain in unspecified knee: Secondary | ICD-10-CM | POA: Insufficient documentation

## 2014-12-13 DIAGNOSIS — E1151 Type 2 diabetes mellitus with diabetic peripheral angiopathy without gangrene: Secondary | ICD-10-CM

## 2014-12-13 NOTE — Patient Instructions (Signed)

## 2014-12-13 NOTE — Progress Notes (Signed)
[facility] [place] HISTORY AND PHYSICAL -PAD  History of Present Illness MARKEEM [name] is a 59 y.o. male patient of Dr. Oneida Alar who returns for followup today. He has known peripheral arterial disease. He previously underwent multiple peripheral interventions in his lower extremities by Dr. Gwenlyn Found. He ultimately ended up with a left leg bypass which failed early. He has previously had vein harvested from the right leg in the left leg. He denies rest pain.   He has changed his diet significantly to try to improve his diabetes. After walking about 100 yards his left calf cramps, relieved by rest, denies non healing wounds.  He reports that the swelling in his lower legs is less in the morning. Pt denies any history of stroke or TIA.   The patient denies New Medical or Surgical History.  Pt Diabetic: Yes, home glucose 230-235 in the last 3 months: fasting and HS, uncontrolled Pt smoker: never smoked  Pt meds include: Statin :Yes Betablocker: Yes ASA: Yes Other anticoagulants/antiplatelets: Plavix  Past Medical History  Diagnosis Date  . Diabetes mellitus   . Hyperlipidemia   . Myocardial infarction 02/2001  . CAD (coronary artery disease)     OV, Dr Harlow Asa, MYOVIEW 5/12 on chart  EKG 10/12 EPIC,  chest x ray 01/07/11 EPIC  . Peripheral vascular disease, unspecified   . Hypertension   . Arthritis   . Pain     LEFT SHOULDER - PREVIOUS LEFT SHOULDER SURGERY MAY 2013  . Neuropathy, peripheral     both feet    Social History History  Substance Use Topics  . Smoking status: Never Smoker   . Smokeless tobacco: Never Used  . Alcohol Use: No    Family History Family History  Problem Relation Age of Onset  . Cancer Mother     Breast and Brain tumor  . Cancer Father     Blood vessel tumor    Past Surgical History  Procedure Laterality Date  . Cholecystectomy    . Appendectomy    . Knee surgery        arthroscopy  left x 2  . Carpal tunnel release       right  . Shoulder surgery      right arthroscopy  . Femoral-tibial bypass graft  01/07/11    Left fem-posterior tibial BPG using reversed left GSV               12/15/11 OK BY DR Ninfa Linden TO CONTINUE ASA AND PLAVIX  . Cardiac catheterization  2006  . Fracture surgery      left  radius/ulna  . Popiteal artery stent  2011    x 4    LEFT/ s/p thrombectomy  . Carpal tunnel release  12/18/2011    Procedure: CARPAL TUNNEL RELEASE;  Surgeon: Mcarthur Rossetti, MD;  Location: [facility];  Service: Orthopedics;  Laterality: Left;  Left Open Carpal Tunnel Release  . Left shoulder surgery   12/18/2011  . Shoulder arthroscopy  07/08/2012    Procedure: ARTHROSCOPY SHOULDER;  Surgeon: Mcarthur Rossetti, MD;  Location: [facility];  Service: Orthopedics;  Laterality: Left;  Left Shoulder Arthroscopy with Manipulation and Extensive Debridement  . Coronary artery bypass graft  2002    x 4  . Cystoscopy      several done in past  . Lithotrispy      several done in past  . Shoulder arthroscopy with rotator cuff repair Left 07/14/2013    Procedure: LEFT  SHOULDER ARTHROSCOPY WITH EXTENSIVE DEBRIDEMENT, DISTAL CLAVICLE REPAIR;  Surgeon: Mcarthur Rossetti, MD;  Location: [facility];  Service: Orthopedics;  Laterality: Left;    No Known Allergies  Current Outpatient Prescriptions  Medication Sig Dispense Refill  . aspirin 325 MG tablet Take 325 mg by mouth daily with breakfast.     . clopidogrel (PLAVIX) 75 MG tablet Take 1 tablet (75 mg total) by mouth daily. 90 tablet 3  . doxycycline (VIBRA-TABS) 100 MG tablet Take 100 mg by mouth 2 (two) times daily.    . fenofibrate (TRICOR) 145 MG tablet TAKE 1 TABLET AT BEDTIME 90 tablet 3  . gabapentin (NEURONTIN) 300 MG capsule Take 300 mg by mouth 3 (three) times daily.     . insulin aspart (NOVOLOG) 100 UNIT/ML injection Inject 15-20 Units into the skin 3 (three) times daily before meals.    . insulin detemir (LEVEMIR) 100 UNIT/ML injection Inject 70 Units  into the skin 2 (two) times daily.    [name] lisinopril-hydrochlorothiazide (PRINZIDE,ZESTORETIC) 20-12.5 MG per tablet Take 1 tablet by mouth daily with breakfast.     . metFORMIN (GLUCOPHAGE-XR) 500 MG 24 hr tablet Take 500 mg by mouth 2 (two) times daily.    . metoprolol succinate (TOPROL-XL) 25 MG 24 hr tablet TAKE 1 TABLET BY MOUTH DAILY, NEED APPT FOR FURTHER REFILLS 30 tablet 1  . simvastatin (ZOCOR) 80 MG tablet Take 80 mg by mouth at bedtime.    [name] oxyCODONE (OXY IR/ROXICODONE) 5 MG immediate release tablet Take 1-2 tablets (5-10 mg total) by mouth every 4 (four) hours as needed for severe pain or breakthrough pain. (Patient not taking: Reported on 12/13/2014) 90 tablet 0   No current facility-administered medications for this visit.    ROS: See HPI for pertinent positives and negatives.   Physical Examination  Filed Vitals:   12/13/14 1124  BP: 122/74  Pulse: 83  Resp: 16  Height: 5\' 11"  (1.803 m)  Weight: 241 lb (109.317 kg)  SpO2: 97%   Body mass index is 33.63 kg/(m^2).  General: A&O x 3, WDWN, obese male. Gait: normal Eyes: PERRLA. Pulmonary: CTAB, without wheezes , rales or rhonchi. Cardiac: regular Rythm , without detected murmur.         Carotid Bruits Right Left   Negative Negative  Aorta is not palpable. Radial pulses: 2+ palpable and =                           VASCULAR EXAM: Extremities without ischemic changes,  without Gangrene; without open wounds. 2+ pitting edema in both lower legs.                                                                                                          LE Pulses Right Left       FEMORAL  1+ palpable  2+ palpable        POPLITEAL  not palpable   not palpable       POSTERIOR TIBIAL  not palpable, biphasic by  Doppler   not palpable, monophasic by Doppler        DORSALIS PEDIS      ANTERIOR TIBIAL not palpable, biphasic by  Doppler  not palpable, monophasic by Doppler    Abdomen: soft, NT, no palpable  masses. Skin: no rashes, no ulcers. Musculoskeletal: no muscle wasting or atrophy.  Neurologic: A&O X 3; Appropriate Affect ; SENSATION: normal; MOTOR FUNCTION:  moving all extremities equally, motor strength 5/5 throughout. Speech is fluent/normal. CN 2-12 intact.    Non-Invasive Vascular Imaging: DATE: 12/13/2014 ABI: RIGHT: 0.79 (1.01, 12/07/13), Waveforms: biphasic, TBI: not performed;  LEFT: 0.66 (0.74),  Waveforms: monophasic, TBI: not performed   ASSESSMENT: [name] is a 59 y.o. male who previously underwent multiple peripheral interventions in his lower extremities by Dr. Gwenlyn Found. He ultimately ended up with a left leg bypass which failed early. He has previously had vein harvested from the right leg in the left leg. He denies rest pain.  After walking about 100 yards his left calf cramps, relieved by rest, denies non healing wounds. ABI's today are significantly worse in both legs compared to a year ago with moderate bilateral arterial occlusive disease. His prominent atherosclerotic risk factor is his uncontrolled DM. Fortunately he has never used tobacco.    PLAN:  Pt advised to work closely with his medical provider that helps him manage his DM to get his DM under as good control as possible.  Graduated walking program discussed in detail.  I discussed in depth with the patient the nature of atherosclerosis, and emphasized the importance of maximal medical management including strict control of blood pressure, blood glucose, and lipid levels, obtaining regular exercise, and continued cessation of smoking.  The patient is aware that without maximal medical management the underlying atherosclerotic disease process will progress, limiting the benefit of any interventions.  Based on the patient's vascular studies and examination, pt will return to clinic in 6 months with ABI's; he knows to call sooner if he develops non healing wounds or worsening claudication.  The patient was  given information about PAD including signs, symptoms, treatment, what symptoms should prompt the patient to seek immediate medical care, and risk reduction measures to take.  Clemon Chambers, RN, MSN, FNP-C [facility] Phone: [phone]  Clinic MD: [facility]  12/13/2014 11:44 AM

## 2015-01-02 ENCOUNTER — Other Ambulatory Visit: Payer: Self-pay | Admitting: Cardiovascular Disease

## 2015-01-15 ENCOUNTER — Other Ambulatory Visit: Payer: Self-pay | Admitting: Cardiovascular Disease

## 2015-01-15 NOTE — Telephone Encounter (Signed)
REFILL 

## 2015-01-16 ENCOUNTER — Encounter: Payer: Self-pay | Admitting: Cardiovascular Disease

## 2015-01-16 ENCOUNTER — Ambulatory Visit (INDEPENDENT_AMBULATORY_CARE_PROVIDER_SITE_OTHER): Payer: BLUE CROSS/BLUE SHIELD | Admitting: Cardiovascular Disease

## 2015-01-16 VITALS — BP 132/76 | HR 82 | Ht 71.0 in | Wt 235.0 lb

## 2015-01-16 DIAGNOSIS — I739 Peripheral vascular disease, unspecified: Secondary | ICD-10-CM

## 2015-01-16 DIAGNOSIS — I2510 Atherosclerotic heart disease of native coronary artery without angina pectoris: Secondary | ICD-10-CM | POA: Diagnosis not present

## 2015-01-16 DIAGNOSIS — R0602 Shortness of breath: Secondary | ICD-10-CM

## 2015-01-16 DIAGNOSIS — I2583 Coronary atherosclerosis due to lipid rich plaque: Secondary | ICD-10-CM

## 2015-01-16 DIAGNOSIS — E785 Hyperlipidemia, unspecified: Secondary | ICD-10-CM

## 2015-01-16 DIAGNOSIS — I10 Essential (primary) hypertension: Secondary | ICD-10-CM | POA: Diagnosis not present

## 2015-01-16 DIAGNOSIS — Z79899 Other long term (current) drug therapy: Secondary | ICD-10-CM | POA: Diagnosis not present

## 2015-01-16 NOTE — Progress Notes (Signed)
01/16/2015 [name]   1956/01/16  [id number]  Primary Physician No PCP Per Patient Primary Cardiologist: Lorretta Harp MD Renae Gloss   HPI:  The patient returns today for followup. He was last seen 08/01/13. He is a 59 year old, moderately overweight, widowed Caucasian male, father of 7, grandfather to 3 grandchildren who has a history of ischemic heart disease status post MI back in 2002 with bypass grafting x4 at that time. He has had occluded graft by cath and has had EECP in the past. His other problems include remote tobacco abuse, diabetes with peripheral neuropathy, dyslipidemia and hypertension. He also has peripheral vascular occlusive disease. I stented his left lower extremity several times, the most recent with a Viabahn endoprosthesis with an excellent result. This ultimated closed probably because of his failure to continue his Plavix. He ultimately underwent above-to-below-the-knee bypass grafting by Dr. Ruta Hinds which eventually closed as well. Dr. Oneida Alar recently did Dopplers on him revealing a right ABI of 1.03 and a left of 0.66. His last Myoview performed Dec 12, 2011, revealed inferolateral thinning towards the apex. He is otherwise asymptomatic.his lipids are followed by Dr. Chalmers Cater, his endocrinologist. He did have a negative Myoview in August of last year prior to orthopedic surgery. He denies chest pain but does complain of increasing dyspnea on exertion. Recent lower extremity Dopplers performed by Dr. Oneida Alar revealed a right ABI of 0.76 and a left of 0.61. He does complain of bilateral lower extremity lifestyle limiting claudication.  Current Outpatient Prescriptions  Medication Sig Dispense Refill  . aspirin 325 MG tablet Take 325 mg by mouth daily with breakfast.     . clopidogrel (PLAVIX) 75 MG tablet Take 1 tablet (75 mg total) by mouth daily. PLEASE KEEP SCHEDULED APPOINTMENT FOR 01/16/15 IN ORDER TO RECEIVE FUTURE REFILLS. THANKS. 15 tablet 0    . gabapentin (NEURONTIN) 300 MG capsule Take 300 mg by mouth 3 (three) times daily.     [name] HYDROcodone-acetaminophen (NORCO/VICODIN) 5-325 MG per tablet Take 1 tablet by mouth as needed.  0  . insulin aspart (NOVOLOG) 100 UNIT/ML injection Inject 15-20 Units into the skin 3 (three) times daily before meals.    . insulin detemir (LEVEMIR) 100 UNIT/ML injection Inject 70 Units into the skin 2 (two) times daily.    [name] lisinopril-hydrochlorothiazide (PRINZIDE,ZESTORETIC) 20-12.5 MG per tablet Take 1 tablet by mouth daily with breakfast.     . metFORMIN (GLUCOPHAGE-XR) 500 MG 24 hr tablet Take 500 mg by mouth 2 (two) times daily.    . metoprolol succinate (TOPROL-XL) 25 MG 24 hr tablet Take 1 tablet (25 mg total) by mouth daily. PLEASE KEEP SCHEDULED APPOINTMENT TO RECEIVE FUTURE REFILLS. THANKS. 15 tablet 0  . simvastatin (ZOCOR) 80 MG tablet Take 80 mg by mouth at bedtime.     No current facility-administered medications for this visit.    No Known Allergies  History   Social History  . Marital Status: Single    Spouse Name: N/A  . Number of Children: N/A  . Years of Education: N/A   Occupational History  . Not on file.   Social History Main Topics  . Smoking status: Never Smoker   . Smokeless tobacco: Never Used  . Alcohol Use: No  . Drug Use: No  . Sexual Activity: Not on file   Other Topics Concern  . Not on file   Social History Narrative     Review of Systems: General: negative for chills, fever, night  sweats or weight changes.  Cardiovascular: negative for chest pain, dyspnea on exertion, edema, orthopnea, palpitations, paroxysmal nocturnal dyspnea or shortness of breath Dermatological: negative for rash Respiratory: negative for cough or wheezing Urologic: negative for hematuria Abdominal: negative for nausea, vomiting, diarrhea, bright red blood per rectum, melena, or hematemesis Neurologic: negative for visual changes, syncope, or dizziness All other systems  reviewed and are otherwise negative except as noted above.    Blood pressure 132/76, pulse 82, height 5\' 11"  (1.803 m), weight 235 lb (106.595 kg).  General appearance: alert and no distress Neck: no adenopathy, no carotid bruit, no JVD, supple, symmetrical, trachea midline and thyroid not enlarged, symmetric, no tenderness/mass/nodules Lungs: clear to auscultation bilaterally Heart: regular rate and rhythm, S1, S2 normal, no murmur, click, rub or gallop Extremities: extremities normal, atraumatic, no cyanosis or edema  EKG normal sinus rhythm at 82 with inferolateral T-wave inversion new from prior EKGs. I personally reviewed this EKG  ASSESSMENT AND PLAN:   PVD (peripheral vascular disease) History of PAD status post multiple attempts at PTCA and stenting of the left SFA CTO as recently as 07/07/10 for "in-stent restenosis ultimately requiring above to the below the knee bypass grafting by Dr. Oneida Alar. This resulted in marked improvement in his symptoms. He does complain of lifestyle limiting claudication. His most recent Dopplers performed by Dr. Oneida Alar 12/13/14 revealed a right ABI 0.79 and a left upward 68. I suspect that he has had progression of disease on his right side involving his distal right SFA based on his prior angiogram and potentially of his bypass graft. I'm going to repeat his lower extremity arterial Dopplers and we'll see him back after that for further evaluation.  Hyperlipidemia History of hyperlipidemia on simvastatin to 80 mg a day. We'll recheck a lipid and liver profile  Essential hypertension History of hypertension with blood pressure measured at 132/76. He is on metoprolol, lisinopril and hydrochlorothiazide. Continue current meds at current dosing  Coronary artery disease History of coronary artery disease status post myocardial infarction back in 2002 with subsequent bypass grafting. He had an occluded graft by cath and has had EECP in the past. His most recent  Myoview performed 12/12/11 revealed inferolateral thinning towards the apex without ischemia. He does complain of increasing dyspnea on exertion but denies chest pain. I'm going to repeat a functional study as well as a 2-D echocardiogram.      Lorretta Harp MD [facility], [facility] 01/16/2015 12:09 PM

## 2015-01-16 NOTE — Assessment & Plan Note (Signed)
History of coronary artery disease status post myocardial infarction back in 2002 with subsequent bypass grafting. He had an occluded graft by cath and has had EECP in the past. His most recent Myoview performed 12/12/11 revealed inferolateral thinning towards the apex without ischemia. He does complain of increasing dyspnea on exertion but denies chest pain. I'm going to repeat a functional study as well as a 2-D echocardiogram.

## 2015-01-16 NOTE — Assessment & Plan Note (Signed)
History of hypertension with blood pressure measured at 132/76. He is on metoprolol, lisinopril and hydrochlorothiazide. Continue current meds at current dosing

## 2015-01-16 NOTE — Patient Instructions (Addendum)
Medication Instructions:  Dr Gwenlyn Found has made no changes in your current medications or treatment plan.  Labwork: Your physician recommends that you return for lab work at your earliest [place].  Testing/Procedures: Your physician has requested that you have a lexiscan myoview. For further information please visit [email or web address]. Please follow instruction sheet, as given.  Your physician has requested that you have an echocardiogram. Echocardiography is a painless test that uses sound waves to create images of your heart. It provides your doctor with information about the size and shape of your heart and how well your heart's chambers and valves are working. This procedure takes approximately one hour. There are no restrictions for this procedure.  Your physician has requested that you have a lower extremity arterial duplex. This test is an ultrasound of the arteries in the legs. It looks at arterial blood flow in the legs. Allow one hour for Lower Arterial scans. There are no restrictions or special instructions.  Follow-Up: Dr Gwenlyn Found recommends that you schedule a follow-up appointment in after you have completed these studies.

## 2015-01-16 NOTE — Assessment & Plan Note (Signed)
History of PAD status post multiple attempts at PTCA and stenting of the left SFA CTO as recently as 07/07/10 for "in-stent restenosis ultimately requiring above to the below the knee bypass grafting by Dr. Oneida Alar. This resulted in marked improvement in his symptoms. He does complain of lifestyle limiting claudication. His most recent Dopplers performed by Dr. Oneida Alar 12/13/14 revealed a right ABI 0.79 and a left upward 68. I suspect that he has had progression of disease on his right side involving his distal right SFA based on his prior angiogram and potentially of his bypass graft. I'm going to repeat his lower extremity arterial Dopplers and we'll see him back after that for further evaluation.

## 2015-01-16 NOTE — Assessment & Plan Note (Signed)
History of hyperlipidemia on simvastatin to 80 mg a day. We'll recheck a lipid and liver profile

## 2015-01-19 LAB — HEPATIC FUNCTION PANEL
ALK PHOS: 107 U/L (ref 39–117)
ALT: 10 U/L (ref 0–53)
AST: 10 U/L (ref 0–37)
Albumin: 3.8 g/dL (ref 3.5–5.2)
BILIRUBIN INDIRECT: 0.3 mg/dL (ref 0.2–1.2)
Bilirubin, Direct: 0.1 mg/dL (ref 0.0–0.3)
Total Bilirubin: 0.4 mg/dL (ref 0.2–1.2)
Total Protein: 6.8 g/dL (ref 6.0–8.3)

## 2015-01-19 LAB — LIPID PANEL
CHOL/HDL RATIO: 13.2 ratio
Cholesterol: 500 mg/dL — ABNORMAL HIGH (ref 0–200)
HDL: 38 mg/dL — ABNORMAL LOW (ref >=40)
Triglycerides: 1769 mg/dL — ABNORMAL HIGH (ref <150)

## 2015-01-19 LAB — LDL CHOLESTEROL, DIRECT: LDL DIRECT: 87 mg/dL

## 2015-01-22 ENCOUNTER — Other Ambulatory Visit: Payer: Self-pay | Admitting: *Deleted

## 2015-01-22 MED ORDER — METOPROLOL SUCCINATE ER 25 MG PO TB24: 25.0000 mg | ORAL_TABLET | Freq: Every day | ORAL | Status: DC

## 2015-01-24 ENCOUNTER — Telehealth: Payer: Self-pay | Admitting: Cardiovascular Disease

## 2015-01-24 ENCOUNTER — Encounter: Payer: Self-pay | Admitting: *Deleted

## 2015-01-24 DIAGNOSIS — E785 Hyperlipidemia, unspecified: Secondary | ICD-10-CM

## 2015-01-24 DIAGNOSIS — Z79899 Other long term (current) drug therapy: Secondary | ICD-10-CM

## 2015-01-24 MED ORDER — FENOFIBRATE 145 MG PO TABS: 145.0000 mg | ORAL_TABLET | Freq: Every day | ORAL | Status: DC

## 2015-01-24 MED ORDER — SIMVASTATIN 80 MG PO TABS: 80.0000 mg | ORAL_TABLET | Freq: Every day | ORAL | Status: DC

## 2015-01-24 NOTE — Telephone Encounter (Signed)
I contacted patient.  He does not recall taking his simvastatin and/or Tricor.  He commented that the just lines up the prescription bottles that he has and takes the meds.  He admits to not following up if he runs out of a medication.  According to our records, he was still taking the simvastatin 80, but stopped the Tricor some time ago.  He is not sure what he is taking.  I advised that I sent in both RX and will mail a lab slip for recheck.  He says that he is not concerned about his cholesterol and that his numbers are always high.

## 2015-01-24 NOTE — Telephone Encounter (Signed)
Per Answering Service: Returning your call,concerning his test results.

## 2015-01-24 NOTE — Telephone Encounter (Signed)
-----   Message from Lorretta Harp, MD sent at 01/20/2015 12:54 PM EDT ----- Pt needs to be referred to Dr Lavetta Nielsen at [facility] for more intensive lipid management

## 2015-01-26 ENCOUNTER — Other Ambulatory Visit: Payer: Self-pay | Admitting: Cardiovascular Disease

## 2015-02-05 ENCOUNTER — Encounter (HOSPITAL_COMMUNITY): Payer: BLUE CROSS/BLUE SHIELD

## 2015-02-05 ENCOUNTER — Telehealth (HOSPITAL_COMMUNITY): Payer: Self-pay

## 2015-02-05 ENCOUNTER — Ambulatory Visit (HOSPITAL_COMMUNITY)
Admission: RE | Admit: 2015-02-05 | Discharge: 2015-02-05 | Disposition: A | Discharge status: Home / Self Care | Payer: BLUE CROSS/BLUE SHIELD | Source: Ambulatory Visit | Attending: Cardiovascular Disease | Admitting: Cardiovascular Disease

## 2015-02-05 DIAGNOSIS — I70201 Unspecified atherosclerosis of native arteries of extremities, right leg: Secondary | ICD-10-CM | POA: Insufficient documentation

## 2015-02-05 DIAGNOSIS — I708 Atherosclerosis of other arteries: Secondary | ICD-10-CM | POA: Insufficient documentation

## 2015-02-05 DIAGNOSIS — I739 Peripheral vascular disease, unspecified: Secondary | ICD-10-CM | POA: Diagnosis not present

## 2015-02-05 NOTE — Telephone Encounter (Signed)
Encounter complete. 

## 2015-02-07 ENCOUNTER — Ambulatory Visit (HOSPITAL_COMMUNITY)
Admission: RE | Admit: 2015-02-07 | Discharge: 2015-02-07 | Disposition: A | Discharge status: Home / Self Care | Payer: BLUE CROSS/BLUE SHIELD | Source: Ambulatory Visit | Attending: Cardiovascular Disease | Admitting: Cardiovascular Disease

## 2015-02-07 ENCOUNTER — Ambulatory Visit (HOSPITAL_BASED_OUTPATIENT_CLINIC_OR_DEPARTMENT_OTHER)
Admission: RE | Admit: 2015-02-07 | Discharge: 2015-02-07 | Disposition: A | Discharge status: Home / Self Care | Payer: BLUE CROSS/BLUE SHIELD | Source: Ambulatory Visit | Attending: Cardiovascular Disease | Admitting: Cardiovascular Disease

## 2015-02-07 DIAGNOSIS — R0600 Dyspnea, unspecified: Secondary | ICD-10-CM | POA: Diagnosis present

## 2015-02-07 DIAGNOSIS — I340 Nonrheumatic mitral (valve) insufficiency: Secondary | ICD-10-CM | POA: Insufficient documentation

## 2015-02-07 DIAGNOSIS — R0602 Shortness of breath: Secondary | ICD-10-CM | POA: Diagnosis not present

## 2015-02-07 LAB — MYOCARDIAL PERFUSION IMAGING
CHL CUP NUCLEAR SSS: 7
LV dias vol: 123 mL
LVSYSVOL: 60 mL
NUC STRESS TID: 1
Peak HR: 98 {beats}/min
Rest HR: 75 {beats}/min
SDS: 4
SRS: 3

## 2015-02-07 MED ORDER — TECHNETIUM TC 99M SESTAMIBI GENERIC - CARDIOLITE
32.0000 | Freq: Once | INTRAVENOUS | Status: AC | PRN
  Administered 2015-02-07: 32 via INTRAVENOUS

## 2015-02-07 MED ORDER — TECHNETIUM TC 99M SESTAMIBI GENERIC - CARDIOLITE
10.9000 | Freq: Once | INTRAVENOUS | Status: AC | PRN
  Administered 2015-02-07: 10.9 via INTRAVENOUS

## 2015-02-07 MED ORDER — REGADENOSON 0.4 MG/5ML IV SOLN
0.4000 mg | Freq: Once | INTRAVENOUS | Status: AC
  Administered 2015-02-07: 0.4 mg via INTRAVENOUS

## 2015-02-11 ENCOUNTER — Telehealth: Payer: Self-pay | Admitting: Cardiovascular Disease

## 2015-02-11 NOTE — Telephone Encounter (Signed)
Opened in error---[name] took call without another encounter.

## 2015-02-22 ENCOUNTER — Encounter: Payer: Self-pay | Admitting: Cardiovascular Disease

## 2015-02-22 ENCOUNTER — Ambulatory Visit (INDEPENDENT_AMBULATORY_CARE_PROVIDER_SITE_OTHER): Payer: BLUE CROSS/BLUE SHIELD | Admitting: Cardiovascular Disease

## 2015-02-22 VITALS — BP 149/80 | HR 88 | Ht 70.0 in | Wt 232.8 lb

## 2015-02-22 DIAGNOSIS — I739 Peripheral vascular disease, unspecified: Secondary | ICD-10-CM

## 2015-02-22 DIAGNOSIS — D689 Coagulation defect, unspecified: Secondary | ICD-10-CM | POA: Diagnosis not present

## 2015-02-22 DIAGNOSIS — Z01811 Encounter for preprocedural respiratory examination: Secondary | ICD-10-CM

## 2015-02-22 DIAGNOSIS — Z79899 Other long term (current) drug therapy: Secondary | ICD-10-CM

## 2015-02-22 DIAGNOSIS — R0609 Other forms of dyspnea: Secondary | ICD-10-CM

## 2015-02-22 DIAGNOSIS — Z01812 Encounter for preprocedural laboratory examination: Secondary | ICD-10-CM

## 2015-02-22 DIAGNOSIS — R0600 Dyspnea, unspecified: Secondary | ICD-10-CM

## 2015-02-22 NOTE — Patient Instructions (Addendum)
Dr. Gwenlyn Found has ordered a peripheral angiogram to be done at [facility].  This procedure is going to look at the bloodflow in your lower extremities.  If Dr. Gwenlyn Found is able to open up the arteries, you will have to spend one night in the hospital.  If he is not able to open the arteries, you will be able to go home that same day.    After the procedure, you will not be allowed to drive for 3 days or push, pull, or lift anything greater than 10 lbs for one week.    You will be required to have the following tests prior to the procedure:  1. Blood work-the blood work can be done no more than 7 days prior to the procedure.  It can be done at any [facility] lab.  There is one downstairs on the first floor of this building and one in the [facility] building [phone])  2. Chest Xray-the chest xray order has already been placed at the [facility].     *REPS Scott Left groin   You have an appointment with Erasmo Downer, our pharmacist, 8/11 at 2:30 in the afternoon.

## 2015-02-22 NOTE — Assessment & Plan Note (Signed)
[name] was complaining of increasing dyspnea on exertion. 2-D echo was normal as was a Myoview stress . He is not a smoker.

## 2015-02-22 NOTE — Assessment & Plan Note (Signed)
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 His recent lipid profile performed 01/16/15 revealed total cholesterol 500, triglyceride  level of 1769. He has not had pancreatitis to my knowledge. He may benefit from being on a PCS K9 monoclonal injectable

## 2015-02-22 NOTE — Assessment & Plan Note (Signed)
History of hypertension blood pressure measured today 149/80. He is in lisinopril, hydrochlorothiazide and metoprolol. Continue current meds at current dosing

## 2015-02-22 NOTE — Assessment & Plan Note (Signed)
Mr. Decaire severe limiting claudication with known peripheral vascular disease. Recent multiple procedures in his left popliteal status post stenting and ultimately SFA to below-the-knee popliteal bypass grafting by Dr. Oneida Alar which has since occluded. Recent lower extremity arterial Doppler studies performed 02/05/15 revealed a right ABI of 0.79 with a high-frequency signal in his right popliteal artery and one vessel runoff via posterior tibial, left ABI 0.66 with an occluded popliteal and 1 vessel runoff via a posterior tibial are in agreement with him and undergo angiography and potential intervention on his right leg imaging of his left leg as well.

## 2015-02-22 NOTE — Progress Notes (Signed)
02/22/2015 [name]   August 02, 1955  [id number]  Primary Physician No PCP Per Patient Primary Cardiologist: Lorretta Harp MD Renae Gloss   HPI:  The patient returns today for followup. He was last seen 01/16/15. He is a 59 year old, moderately overweight, widowed Caucasian male, father of 64, grandfather to 3 grandchildren who has a history of ischemic heart disease status post MI back in 2002 with bypass grafting x4 at that time. He has had occluded graft by cath and has had EECP in the past. His other problems include remote tobacco abuse, diabetes with peripheral neuropathy, dyslipidemia and hypertension. He also has peripheral vascular occlusive disease. I stented his left lower extremity several times, the most recent with a Viabahn endoprosthesis with an excellent result. This ultimated closed probably because of his failure to continue his Plavix. He ultimately underwent above-to-below-the-knee bypass grafting by Dr. Ruta Hinds which eventually closed as well. Dr. Oneida Alar recently did Dopplers on him revealing a right ABI of 1.03 and a left of 0.66. His last Myoview performed Dec 12, 2011, revealed inferolateral thinning towards the apex. He is otherwise asymptomatic.his lipids are followed by Dr. Chalmers Cater, his endocrinologist. He did have a negative Myoview in August of last year prior to orthopedic surgery. He denies chest pain but does complain of increasing dyspnea on exertion. Recent lower extremity Dopplers performed by Dr. Oneida Alar revealed a right ABI of 0.76 and a left of 0.61. He does complain of bilateral lower extremity lifestyle limiting claudication. Recent Dopplers performed 02/05/15 revealed a right ABI 0.79 with hypertonicity signal in the right popliteal artery and one-vessel runoff, left ABI of 0.66 with an occluded popliteal artery. A 2-D echo and Myoview stress test were unrevealing.   Current Outpatient Prescriptions  Medication Sig Dispense Refill  . aspirin  325 MG tablet Take 325 mg by mouth daily with breakfast.     . clopidogrel (PLAVIX) 75 MG tablet Take 1 tablet (75 mg total) by mouth daily. PLEASE KEEP SCHEDULED APPOINTMENT FOR 01/16/15 IN ORDER TO RECEIVE FUTURE REFILLS. THANKS. 15 tablet 0  . fenofibrate (TRICOR) 145 MG tablet Take 1 tablet (145 mg total) by mouth daily. 90 tablet 3  . gabapentin (NEURONTIN) 300 MG capsule Take 300 mg by mouth 3 (three) times daily.     . insulin aspart (NOVOLOG) 100 UNIT/ML injection Inject 15-20 Units into the skin 3 (three) times daily before meals.    . insulin detemir (LEVEMIR) 100 UNIT/ML injection Inject 70 Units into the skin 2 (two) times daily.    [name] lisinopril-hydrochlorothiazide (PRINZIDE,ZESTORETIC) 20-12.5 MG per tablet Take 1 tablet by mouth daily with breakfast.     . metFORMIN (GLUCOPHAGE-XR) 500 MG 24 hr tablet Take 500 mg by mouth 2 (two) times daily.    . metoprolol succinate (TOPROL-XL) 25 MG 24 hr tablet Take 1 tablet (25 mg total) by mouth daily. 30 tablet 6  . simvastatin (ZOCOR) 80 MG tablet Take 1 tablet (80 mg total) by mouth at bedtime. 90 tablet 3   No current facility-administered medications for this visit.    No Known Allergies  History   Social History  . Marital Status: Single    Spouse Name: N/A  . Number of Children: N/A  . Years of Education: N/A   Occupational History  . Not on file.   Social History Main Topics  . Smoking status: Never Smoker   . Smokeless tobacco: Never Used  . Alcohol Use: No  . Drug Use: No  .  Sexual Activity: Not on file   Other Topics Concern  . Not on file   Social History Narrative     Review of Systems: General: negative for chills, fever, night sweats or weight changes.  Cardiovascular: negative for chest pain, dyspnea on exertion, edema, orthopnea, palpitations, paroxysmal nocturnal dyspnea or shortness of breath Dermatological: negative for rash Respiratory: negative for cough or wheezing Urologic: negative for  hematuria Abdominal: negative for nausea, vomiting, diarrhea, bright red blood per rectum, melena, or hematemesis Neurologic: negative for visual changes, syncope, or dizziness All other systems reviewed and are otherwise negative except as noted above.    Blood pressure 149/80, pulse 88, height 5\' 10"  (1.778 m), weight 232 lb 12.8 oz (105.597 kg).  General appearance: alert and no distress Neck: no adenopathy, no carotid bruit, no JVD, supple, symmetrical, trachea midline and thyroid not enlarged, symmetric, no tenderness/mass/nodules Lungs: clear to auscultation bilaterally Heart: regular rate and rhythm, S1, S2 normal, no murmur, click, rub or gallop Extremities: extremities normal, atraumatic, no cyanosis or edema  EKG not performed today  ASSESSMENT AND PLAN:   PVD (peripheral vascular disease) [name] severe limiting claudication with known peripheral vascular disease. Recent multiple procedures in his left popliteal status post stenting and ultimately SFA to below-the-knee popliteal bypass grafting by Dr. Oneida Alar which has since occluded. Recent lower extremity arterial Doppler studies performed 02/05/15 revealed a right ABI of 0.79 with a high-frequency signal in his right popliteal artery and one vessel runoff via posterior tibial, left ABI 0.66 with an occluded popliteal and 1 vessel runoff via a posterior tibial are in agreement with him and undergo angiography and potential intervention on his right leg imaging of his left leg as well.  Hyperlipidemia History of familial hyperlipidemia on simvastatin and fenofibrate. His recent lipid profile performed 01/16/15 revealed total cholesterol 500, triglyceride  level of 1769. He has not had pancreatitis to my knowledge. He may benefit from being on a PCS K9 monoclonal injectable  Essential hypertension History of hypertension blood pressure measured today 149/80. He is in lisinopril, hydrochlorothiazide and metoprolol. Continue current  meds at current dosing  Dyspnea on exertion [name] was complaining of increasing dyspnea on exertion. 2-D echo was normal as was a Myoview stress . He is not a smoker.      Lorretta Harp MD FACP,FACC,FAHA, [facility] 02/22/2015 10:30 AM

## 2015-02-26 ENCOUNTER — Ambulatory Visit
Admission: RE | Admit: 2015-02-26 | Discharge: 2015-02-26 | Disposition: A | Discharge status: Home / Self Care | Payer: BLUE CROSS/BLUE SHIELD | Source: Ambulatory Visit | Attending: Cardiovascular Disease | Admitting: Cardiovascular Disease

## 2015-02-26 DIAGNOSIS — Z01811 Encounter for preprocedural respiratory examination: Secondary | ICD-10-CM

## 2015-02-27 ENCOUNTER — Encounter: Payer: Self-pay | Admitting: *Deleted

## 2015-02-28 ENCOUNTER — Ambulatory Visit (INDEPENDENT_AMBULATORY_CARE_PROVIDER_SITE_OTHER): Payer: BLUE CROSS/BLUE SHIELD | Admitting: Pharmacist Clinician (PhC)/ Clinical Pharmacy Specialist

## 2015-02-28 ENCOUNTER — Encounter: Payer: Self-pay | Admitting: Pharmacist Clinician (PhC)/ Clinical Pharmacy Specialist

## 2015-02-28 VITALS — Ht 70.0 in | Wt 232.7 lb

## 2015-02-28 DIAGNOSIS — E785 Hyperlipidemia, unspecified: Secondary | ICD-10-CM | POA: Diagnosis not present

## 2015-02-28 NOTE — Assessment & Plan Note (Signed)
Patient has highly elevated triglycerides.  He has been taking fenofibrate 145 mg, although admits to not having great compliance.  He also admits that his DM is out of control, last Aic ws > 13.  He is on Novolog and Levimir and admits that he misses morning and lunch Novolog doses most days.  He states Dr Chalmers Cater told him it was okay to take Novolog within a hour of his meals, as his sugar usually runs between 300-400, but he still doesn't comply.  I explained today that until he gets better control of the blood sugars, there isn't much we can do to lower his triglycerides.  Reviewed the risk of pancreatitis with elevated trigs as well.  Patient understands that he needs to have better control of his DM.  For now I gave him some tips to be more compliant with insulins and stressed the need to take his fenofibrate daily with food.  He is due to repeat labs with Dr. Chalmers Cater in late September.  We will review those and decide what to do at that point.  He is not eligible for PCSK-9 inhibitor therapy, as his direct LDL is <100.

## 2015-02-28 NOTE — Progress Notes (Signed)
02/28/2015 [name] [birth year] [id number]   HPI:  [name] is a 59 y.o. male patient of Dr Gwenlyn Found, who presents today for a lipid clinic evaluation.    RF:  DM type 2, uncontrolled; PVD  Meds: simvastatini 80 mg qd, fenofibrate 145 mg qd   Family history: no significant CV family history, both parents died from cancer, 38 of 4 siblings may be diabetic  Diet: admits not the best.  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    Exercise: not able because of claudication in legs    Labs:  12/2014 - TC 500, TG 1769. HDL 38, LDL (direct) 87   Current Outpatient Prescriptions  Medication Sig Dispense Refill  . aspirin 325 MG tablet Take 325 mg by mouth daily with breakfast.     . clopidogrel (PLAVIX) 75 MG tablet Take 1 tablet (75 mg total) by mouth daily. PLEASE KEEP SCHEDULED APPOINTMENT FOR 01/16/15 IN ORDER TO RECEIVE FUTURE REFILLS. THANKS. 15 tablet 0  . fenofibrate (TRICOR) 145 MG tablet Take 1 tablet (145 mg total) by mouth daily. 90 tablet 3  . gabapentin (NEURONTIN) 300 MG capsule Take 300 mg by mouth 3 (three) times daily.     . insulin aspart (NOVOLOG) 100 UNIT/ML injection Inject 15-20 Units into the skin 3 (three) times daily before meals.    . insulin detemir (LEVEMIR) 100 UNIT/ML injection Inject 70 Units into the skin 2 (two) times daily.    [name] lisinopril-hydrochlorothiazide (PRINZIDE,ZESTORETIC) 20-12.5 MG per tablet Take 1 tablet by mouth daily with breakfast.     . metFORMIN (GLUCOPHAGE-XR) 500 MG 24 hr tablet Take 500 mg by mouth 2 (two) times daily.    . metoprolol succinate (TOPROL-XL) 25 MG 24 hr tablet Take 1 tablet (25 mg total) by mouth daily. 30 tablet 6  . simvastatin (ZOCOR) 80 MG tablet Take 1 tablet (80 mg total) by mouth at bedtime. 90 tablet 3   No current facility-administered medications for this visit.    No Known Allergies  Past Medical  History  Diagnosis Date  . Diabetes mellitus   . Hyperlipidemia   . Myocardial infarction 02/2001  . CAD (coronary artery disease)     OV, Dr Harlow Asa, MYOVIEW 5/12 on chart  EKG 10/12 EPIC,  chest x ray 01/07/11 EPIC  . Peripheral vascular disease, unspecified   . Hypertension   . Arthritis   . Pain     LEFT SHOULDER - PREVIOUS LEFT SHOULDER SURGERY MAY 2013  . Neuropathy, peripheral     both feet  . Kidney stones   . Sleep apnea     Per hospital discharge 12/19/2011--Stop Bang score 4  . Carpal tunnel syndrome     peripheral neuropathy    Height 5\' 10"  (1.778 m), weight 232 lb 11.2 oz (105.552 kg).   ASSESSMENT AND PLAN:  Tommy Medal PharmD CPP [facility]

## 2015-02-28 NOTE — Patient Instructions (Signed)
Continue with your fenofibrate daily with dinner.  It is best taken with food.  Work to get your blood sugars better controlled.  Try to get your insulin in daily and check your blood sugars regularly.  Repeat cholesterol labs in mid to late September.

## 2015-03-21 DIAGNOSIS — I739 Peripheral vascular disease, unspecified: Secondary | ICD-10-CM

## 2015-03-21 HISTORY — DX: Peripheral vascular disease, unspecified: I73.9

## 2015-03-22 LAB — BASIC METABOLIC PANEL
BUN: 14 mg/dL (ref 7–25)
CHLORIDE: 103 mmol/L (ref 98–110)
CO2: 30 mmol/L (ref 20–31)
Calcium: 9.2 mg/dL (ref 8.6–10.3)
Creat: 0.93 mg/dL (ref 0.70–1.25)
GLUCOSE: 224 mg/dL — AB (ref 65–99)
POTASSIUM: 4.9 mmol/L (ref 3.5–5.3)
SODIUM: 140 mmol/L (ref 135–146)

## 2015-03-22 LAB — CBC
HEMATOCRIT: 38.5 % — AB (ref 39.0–52.0)
HEMOGLOBIN: 12.9 g/dL — AB (ref 13.0–17.0)
MCH: 28.7 pg (ref 26.0–34.0)
MCHC: 33.5 g/dL (ref 30.0–36.0)
MCV: 85.6 fL (ref 78.0–100.0)
MPV: 9.7 fL (ref 8.6–12.4)
Platelets: 268 10*3/uL (ref 150–400)
RBC: 4.5 MIL/uL (ref 4.22–5.81)
RDW: 13.4 % (ref 11.5–15.5)
WBC: 7.2 10*3/uL (ref 4.0–10.5)

## 2015-03-22 LAB — PROTIME-INR
INR: 0.89 (ref <1.50)
Prothrombin Time: 12.1 seconds (ref 11.6–15.2)

## 2015-03-22 LAB — APTT: aPTT: 27 seconds (ref 24–37)

## 2015-03-22 LAB — TSH: TSH: 2.272 u[IU]/mL (ref 0.350–4.500)

## 2015-03-26 ENCOUNTER — Encounter: Payer: Self-pay | Admitting: *Deleted

## 2015-03-28 ENCOUNTER — Other Ambulatory Visit: Payer: Self-pay | Admitting: Physician Assistant

## 2015-03-28 ENCOUNTER — Encounter (HOSPITAL_COMMUNITY): Payer: Self-pay | Admitting: Cardiovascular Disease

## 2015-03-28 ENCOUNTER — Encounter (HOSPITAL_COMMUNITY)
Admission: RE | Disposition: A | Discharge status: Home / Self Care | Payer: Self-pay | Source: Ambulatory Visit | Attending: Cardiovascular Disease

## 2015-03-28 ENCOUNTER — Ambulatory Visit (HOSPITAL_COMMUNITY)
Admission: RE | Admit: 2015-03-28 | Discharge: 2015-03-29 | Disposition: A | Discharge status: Home / Self Care | Payer: BLUE CROSS/BLUE SHIELD | Source: Ambulatory Visit | Attending: Cardiovascular Disease | Admitting: Cardiovascular Disease

## 2015-03-28 DIAGNOSIS — Z7982 Long term (current) use of aspirin: Secondary | ICD-10-CM | POA: Diagnosis not present

## 2015-03-28 DIAGNOSIS — I739 Peripheral vascular disease, unspecified: Secondary | ICD-10-CM

## 2015-03-28 DIAGNOSIS — Z794 Long term (current) use of insulin: Secondary | ICD-10-CM | POA: Insufficient documentation

## 2015-03-28 DIAGNOSIS — E785 Hyperlipidemia, unspecified: Secondary | ICD-10-CM | POA: Diagnosis not present

## 2015-03-28 DIAGNOSIS — I10 Essential (primary) hypertension: Secondary | ICD-10-CM | POA: Diagnosis not present

## 2015-03-28 DIAGNOSIS — Z23 Encounter for immunization: Secondary | ICD-10-CM | POA: Insufficient documentation

## 2015-03-28 DIAGNOSIS — I70211 Atherosclerosis of native arteries of extremities with intermittent claudication, right leg: Secondary | ICD-10-CM | POA: Diagnosis not present

## 2015-03-28 DIAGNOSIS — I70213 Atherosclerosis of native arteries of extremities with intermittent claudication, bilateral legs: Secondary | ICD-10-CM | POA: Insufficient documentation

## 2015-03-28 DIAGNOSIS — E1165 Type 2 diabetes mellitus with hyperglycemia: Secondary | ICD-10-CM | POA: Insufficient documentation

## 2015-03-28 DIAGNOSIS — R0609 Other forms of dyspnea: Secondary | ICD-10-CM

## 2015-03-28 DIAGNOSIS — Z01811 Encounter for preprocedural respiratory examination: Secondary | ICD-10-CM

## 2015-03-28 DIAGNOSIS — I252 Old myocardial infarction: Secondary | ICD-10-CM | POA: Diagnosis not present

## 2015-03-28 DIAGNOSIS — R0600 Dyspnea, unspecified: Secondary | ICD-10-CM

## 2015-03-28 DIAGNOSIS — E119 Type 2 diabetes mellitus without complications: Secondary | ICD-10-CM

## 2015-03-28 DIAGNOSIS — D689 Coagulation defect, unspecified: Secondary | ICD-10-CM

## 2015-03-28 DIAGNOSIS — Z79899 Other long term (current) drug therapy: Secondary | ICD-10-CM

## 2015-03-28 DIAGNOSIS — Z01812 Encounter for preprocedural laboratory examination: Secondary | ICD-10-CM

## 2015-03-28 HISTORY — DX: Type 2 diabetes mellitus without complications: E11.9

## 2015-03-28 HISTORY — DX: Unspecified malignant neoplasm of skin, unspecified: C44.90

## 2015-03-28 HISTORY — DX: Acute embolism and thrombosis of unspecified deep veins of unspecified lower extremity: I82.409

## 2015-03-28 HISTORY — DX: Other chronic pain: G89.29

## 2015-03-28 HISTORY — DX: Other chronic pain: M25.519

## 2015-03-28 HISTORY — PX: PERIPHERAL VASCULAR CATHETERIZATION: SHX172C

## 2015-03-28 LAB — GLUCOSE, CAPILLARY
GLUCOSE-CAPILLARY: 215 mg/dL — AB (ref 65–99)
GLUCOSE-CAPILLARY: 291 mg/dL — AB (ref 65–99)
Glucose-Capillary: 320 mg/dL — ABNORMAL HIGH (ref 65–99)
Glucose-Capillary: 277 mg/dL — ABNORMAL HIGH (ref 65–99)
Glucose-Capillary: 231 mg/dL — ABNORMAL HIGH (ref 65–99)
Glucose-Capillary: 348 mg/dL — ABNORMAL HIGH (ref 65–99)

## 2015-03-28 LAB — POCT ACTIVATED CLOTTING TIME
ACTIVATED CLOTTING TIME: 214 s
ACTIVATED CLOTTING TIME: 239 s
Activated Clotting Time: 190 seconds

## 2015-03-28 SURGERY — LOWER EXTREMITY ANGIOGRAPHY
Laterality: Right

## 2015-03-28 MED ORDER — INSULIN ASPART 100 UNIT/ML ~~LOC~~ SOLN
0.0000 [IU] | Freq: Every day | SUBCUTANEOUS | Status: DC
  Administered 2015-03-28: 4 [IU] via SUBCUTANEOUS

## 2015-03-28 MED ORDER — INSULIN ASPART 100 UNIT/ML ~~LOC~~ SOLN: 0.0000 [IU] | Freq: Three times a day (TID) | SUBCUTANEOUS | Status: DC

## 2015-03-28 MED ORDER — FENOFIBRATE 160 MG PO TABS
160.0000 mg | ORAL_TABLET | Freq: Every day | ORAL | Status: DC
  Administered 2015-03-28 – 2015-03-29 (×2): 160 mg via ORAL
  Filled 2015-03-28 (×2): qty 1

## 2015-03-28 MED ORDER — INSULIN DETEMIR 100 UNIT/ML ~~LOC~~ SOLN
70.0000 [IU] | Freq: Two times a day (BID) | SUBCUTANEOUS | Status: DC
  Administered 2015-03-28 – 2015-03-29 (×2): 70 [IU] via SUBCUTANEOUS
  Filled 2015-03-28 (×4): qty 0.7

## 2015-03-28 MED ORDER — HYDROCHLOROTHIAZIDE 12.5 MG PO CAPS
12.5000 mg | ORAL_CAPSULE | Freq: Every day | ORAL | Status: DC
  Administered 2015-03-29: 10:00:00 12.5 mg via ORAL
  Filled 2015-03-28: qty 1

## 2015-03-28 MED ORDER — LIDOCAINE HCL (PF) 1 % IJ SOLN
INTRAMUSCULAR | Status: AC
  Filled 2015-03-28: qty 30

## 2015-03-28 MED ORDER — MIDAZOLAM HCL 2 MG/2ML IJ SOLN
INTRAMUSCULAR | Status: AC
  Filled 2015-03-28: qty 4

## 2015-03-28 MED ORDER — IODIXANOL 320 MG/ML IV SOLN
INTRAVENOUS | Status: DC | PRN
  Administered 2015-03-28: 225 mL via INTRA_ARTERIAL

## 2015-03-28 MED ORDER — CLOPIDOGREL BISULFATE 75 MG PO TABS
75.0000 mg | ORAL_TABLET | Freq: Every day | ORAL | Status: DC
Start: 2015-03-28 — End: 2015-03-29
  Administered 2015-03-28 – 2015-03-29 (×2): 75 mg via ORAL
  Filled 2015-03-28 (×2): qty 1

## 2015-03-28 MED ORDER — INSULIN ASPART 100 UNIT/ML ~~LOC~~ SOLN
SUBCUTANEOUS | Status: AC
  Administered 2015-03-28: 11 [IU] via SUBCUTANEOUS
  Filled 2015-03-28: qty 1

## 2015-03-28 MED ORDER — INSULIN ASPART 100 UNIT/ML ~~LOC~~ SOLN: 15.0000 [IU] | Freq: Three times a day (TID) | SUBCUTANEOUS | Status: DC

## 2015-03-28 MED ORDER — SODIUM CHLORIDE 0.9 % IV SOLN: INTRAVENOUS | Status: AC

## 2015-03-28 MED ORDER — ASPIRIN 81 MG PO CHEW
CHEWABLE_TABLET | ORAL | Status: AC
  Administered 2015-03-28: 81 mg via ORAL
  Filled 2015-03-28: qty 1

## 2015-03-28 MED ORDER — HEPARIN SODIUM (PORCINE) 1000 UNIT/ML IJ SOLN
INTRAMUSCULAR | Status: DC | PRN
  Administered 2015-03-28: 4000 [IU] via INTRAVENOUS
  Administered 2015-03-28: 6000 [IU] via INTRAVENOUS

## 2015-03-28 MED ORDER — ACETAMINOPHEN 325 MG PO TABS: 650.0000 mg | ORAL_TABLET | ORAL | Status: DC | PRN

## 2015-03-28 MED ORDER — SODIUM CHLORIDE 0.9 % IJ SOLN: 3.0000 mL | INTRAMUSCULAR | Status: DC | PRN

## 2015-03-28 MED ORDER — METOPROLOL SUCCINATE ER 25 MG PO TB24
25.0000 mg | ORAL_TABLET | Freq: Every day | ORAL | Status: DC
  Administered 2015-03-28 – 2015-03-29 (×2): 25 mg via ORAL
  Filled 2015-03-28 (×2): qty 1

## 2015-03-28 MED ORDER — HEPARIN (PORCINE) IN NACL 2-0.9 UNIT/ML-% IJ SOLN
INTRAMUSCULAR | Status: AC
Start: 2015-03-28 — End: 2015-03-28
  Filled 2015-03-28: qty 1000

## 2015-03-28 MED ORDER — HEPARIN (PORCINE) IN NACL 2-0.9 UNIT/ML-% IJ SOLN
INTRAMUSCULAR | Status: DC | PRN
  Administered 2015-03-28: 25 mL

## 2015-03-28 MED ORDER — GABAPENTIN 300 MG PO CAPS
300.0000 mg | ORAL_CAPSULE | Freq: Three times a day (TID) | ORAL | Status: DC
  Administered 2015-03-28 – 2015-03-29 (×2): 300 mg via ORAL
  Filled 2015-03-28 (×2): qty 1

## 2015-03-28 MED ORDER — PNEUMOCOCCAL VAC POLYVALENT 25 MCG/0.5ML IJ INJ
0.5000 mL | INJECTION | INTRAMUSCULAR | Status: AC
  Administered 2015-03-29: 10:00:00 0.5 mL via INTRAMUSCULAR
  Filled 2015-03-28: qty 0.5

## 2015-03-28 MED ORDER — MIDAZOLAM HCL 2 MG/2ML IJ SOLN
INTRAMUSCULAR | Status: DC | PRN
  Administered 2015-03-28 (×2): 1 mg via INTRAVENOUS

## 2015-03-28 MED ORDER — ONDANSETRON HCL 4 MG/2ML IJ SOLN: 4.0000 mg | Freq: Four times a day (QID) | INTRAMUSCULAR | Status: DC | PRN

## 2015-03-28 MED ORDER — ATORVASTATIN CALCIUM 40 MG PO TABS
40.0000 mg | ORAL_TABLET | Freq: Every day | ORAL | Status: DC
  Administered 2015-03-28: 22:00:00 40 mg via ORAL
  Filled 2015-03-28: qty 1

## 2015-03-28 MED ORDER — SODIUM CHLORIDE 0.9 % WEIGHT BASED INFUSION: 1.0000 mL/kg/h | INTRAVENOUS | Status: DC

## 2015-03-28 MED ORDER — LISINOPRIL-HYDROCHLOROTHIAZIDE 20-12.5 MG PO TABS: 1.0000 | ORAL_TABLET | Freq: Every day | ORAL | Status: DC

## 2015-03-28 MED ORDER — SODIUM CHLORIDE 0.9 % WEIGHT BASED INFUSION
3.0000 mL/kg/h | INTRAVENOUS | Status: DC
  Administered 2015-03-28: 3 mL/kg/h via INTRAVENOUS

## 2015-03-28 MED ORDER — LIVING WELL WITH DIABETES BOOK
Freq: Once | Status: AC
  Administered 2015-03-28
  Filled 2015-03-28: qty 1

## 2015-03-28 MED ORDER — MORPHINE SULFATE (PF) 2 MG/ML IV SOLN: 2.0000 mg | INTRAVENOUS | Status: DC | PRN

## 2015-03-28 MED ORDER — FENTANYL CITRATE (PF) 100 MCG/2ML IJ SOLN
INTRAMUSCULAR | Status: AC
  Filled 2015-03-28: qty 4

## 2015-03-28 MED ORDER — INSULIN ASPART 100 UNIT/ML ~~LOC~~ SOLN
0.0000 [IU] | Freq: Three times a day (TID) | SUBCUTANEOUS | Status: DC
  Administered 2015-03-28: 14:00:00 2 [IU] via SUBCUTANEOUS
  Administered 2015-03-28: 11 [IU] via SUBCUTANEOUS
  Administered 2015-03-29: 5 [IU] via SUBCUTANEOUS
  Filled 2015-03-28: qty 0.15

## 2015-03-28 MED ORDER — ASPIRIN 81 MG PO CHEW
81.0000 mg | CHEWABLE_TABLET | ORAL | Status: AC
  Administered 2015-03-28: 81 mg via ORAL

## 2015-03-28 MED ORDER — LISINOPRIL 10 MG PO TABS
20.0000 mg | ORAL_TABLET | Freq: Every day | ORAL | Status: DC
  Administered 2015-03-29: 20 mg via ORAL
  Filled 2015-03-28: qty 2

## 2015-03-28 MED ORDER — HEPARIN SODIUM (PORCINE) 1000 UNIT/ML IJ SOLN
INTRAMUSCULAR | Status: AC
  Filled 2015-03-28: qty 1

## 2015-03-28 MED ORDER — FENTANYL CITRATE (PF) 100 MCG/2ML IJ SOLN
INTRAMUSCULAR | Status: DC | PRN
  Administered 2015-03-28 (×2): 25 ug via INTRAVENOUS

## 2015-03-28 MED ORDER — ASPIRIN 325 MG PO TABS
325.0000 mg | ORAL_TABLET | Freq: Every day | ORAL | Status: DC
  Administered 2015-03-29: 10:00:00 325 mg via ORAL
  Filled 2015-03-28: qty 1

## 2015-03-28 MED ORDER — HYDRALAZINE HCL 20 MG/ML IJ SOLN: 10.0000 mg | INTRAMUSCULAR | Status: DC | PRN

## 2015-03-28 SURGICAL SUPPLY — 26 items
BAG SNAP BAND KOVER 36X36 (MISCELLANEOUS) ×3 IMPLANT
BALLN LUTONIX DCB 4X80X130 (BALLOONS) ×3
BALLOON LUTONIX DCB 4X80X130 (BALLOONS) ×2 IMPLANT
CATH ANGIO 5F PIGTAIL 65CM (CATHETERS) ×3 IMPLANT
CATH CROSS OVER TEMPO 5F (CATHETERS) ×3 IMPLANT
CATH HAWKONE LS STANDARD TIP (CATHETERS) ×3
CATH HAWKONE LS STD TIP (CATHETERS) ×2 IMPLANT
CATH STRAIGHT 5FR 65CM (CATHETERS) ×3 IMPLANT
DEVICE CONTINUOUS FLUSH (MISCELLANEOUS) ×3 IMPLANT
DEVICE SPIDERFX EMB PROT 5MM (WIRE) ×3 IMPLANT
KIT ENCORE 26 ADVANTAGE (KITS) ×3 IMPLANT
KIT PV (KITS) ×3 IMPLANT
SHEATH BRITE TIP 8FR 35CM (SHEATH) ×3 IMPLANT
SHEATH HIGHFLEX ANSEL 7FR 55CM (SHEATH) ×3 IMPLANT
SHEATH PINNACLE 5F 10CM (SHEATH) ×3 IMPLANT
SHEATH PINNACLE 7F 10CM (SHEATH) ×3 IMPLANT
SHEATH PINNACLE MP 7F 45CM (SHEATH) ×3 IMPLANT
STOPCOCK MORSE 400PSI 3WAY (MISCELLANEOUS) ×3 IMPLANT
SYRINGE MEDRAD AVANTA MACH 7 (SYRINGE) ×3 IMPLANT
TAPE RADIOPAQUE TURBO (MISCELLANEOUS) ×3 IMPLANT
TRANSDUCER W/STOPCOCK (MISCELLANEOUS) ×3 IMPLANT
TRAY PV CATH (CUSTOM PROCEDURE TRAY) ×3 IMPLANT
TUBING CIL FLEX 10 FLL-RA (TUBING) ×3 IMPLANT
WIRE AMPLATZ SS-J .035X180CM (WIRE) ×3 IMPLANT
WIRE HITORQ VERSACORE ST 145CM (WIRE) ×3 IMPLANT
WIRE SPARTACORE .014X300CM (WIRE) ×3 IMPLANT

## 2015-03-28 NOTE — H&P (View-Only) (Signed)
02/28/2015 [name] 07/19/1956 [id number]   HPI:  [name] is a 58 y.o. male patient of Dr Berry, who presents today for a lipid clinic evaluation.    RF:  DM type 2, uncontrolled; PVD  Meds: simvastatini 80 mg qd, fenofibrate 145 mg qd   Family history: no significant CV family history, both parents died from cancer, 1 of 4 siblings may be diabetic  Diet: admits not the best.  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    Exercise: not able because of claudication in legs    Labs:  12/2014 - TC 500, TG 1769. HDL 38, LDL (direct) 87   Current Outpatient Prescriptions  Medication Sig Dispense Refill  . aspirin 325 MG tablet Take 325 mg by mouth daily with breakfast.     . clopidogrel (PLAVIX) 75 MG tablet Take 1 tablet (75 mg total) by mouth daily. PLEASE KEEP SCHEDULED APPOINTMENT FOR 01/16/15 IN ORDER TO RECEIVE FUTURE REFILLS. THANKS. 15 tablet 0  . fenofibrate (TRICOR) 145 MG tablet Take 1 tablet (145 mg total) by mouth daily. 90 tablet 3  . gabapentin (NEURONTIN) 300 MG capsule Take 300 mg by mouth 3 (three) times daily.     . insulin aspart (NOVOLOG) 100 UNIT/ML injection Inject 15-20 Units into the skin 3 (three) times daily before meals.    . insulin detemir (LEVEMIR) 100 UNIT/ML injection Inject 70 Units into the skin 2 (two) times daily.    . lisinopril-hydrochlorothiazide (PRINZIDE,ZESTORETIC) 20-12.5 MG per tablet Take 1 tablet by mouth daily with breakfast.     . metFORMIN (GLUCOPHAGE-XR) 500 MG 24 hr tablet Take 500 mg by mouth 2 (two) times daily.    . metoprolol succinate (TOPROL-XL) 25 MG 24 hr tablet Take 1 tablet (25 mg total) by mouth daily. 30 tablet 6  . simvastatin (ZOCOR) 80 MG tablet Take 1 tablet (80 mg total) by mouth at bedtime. 90 tablet 3   No current facility-administered medications for this visit.    No Known Allergies  Past Medical  History  Diagnosis Date  . Diabetes mellitus   . Hyperlipidemia   . Myocardial infarction 02/2001  . CAD (coronary artery disease)     OV, Dr Berry,[facility], MYOVIEW 5/12 on chart  EKG 10/12 EPIC,  chest x ray 01/07/11 EPIC  . Peripheral vascular disease, unspecified   . Hypertension   . Arthritis   . Pain     LEFT SHOULDER - PREVIOUS LEFT SHOULDER SURGERY MAY 2013  . Neuropathy, peripheral     both feet  . Kidney stones   . Sleep apnea     Per hospital discharge 12/19/2011--Stop Bang score 4  . Carpal tunnel syndrome     peripheral neuropathy    Height 5' 10" (1.778 m), weight 232 lb 11.2 oz (105.552 kg).   ASSESSMENT AND PLAN:  [name] PharmD CPP [facility]      

## 2015-03-28 NOTE — Interval H&P Note (Signed)
History and Physical Interval Note:  03/28/2015 7:38 AM  [name]  has presented today for surgery, with the diagnosis of pvd  The various methods of treatment have been discussed with the patient and family. After consideration of risks, benefits and other options for treatment, the patient has consented to  Procedure(s): Lower Extremity Angiography (N/A) as a surgical intervention .  The patient's history has been reviewed, patient examined, no change in status, stable for surgery.  I have reviewed the patient's chart and labs.  Questions were answered to the patient's satisfaction.     Quay Burow

## 2015-03-29 ENCOUNTER — Encounter (HOSPITAL_COMMUNITY): Payer: Self-pay | Admitting: Physician Assistant

## 2015-03-29 DIAGNOSIS — I739 Peripheral vascular disease, unspecified: Secondary | ICD-10-CM

## 2015-03-29 DIAGNOSIS — I70213 Atherosclerosis of native arteries of extremities with intermittent claudication, bilateral legs: Secondary | ICD-10-CM | POA: Diagnosis not present

## 2015-03-29 LAB — BASIC METABOLIC PANEL
Anion gap: 9 (ref 5–15)
BUN: 14 mg/dL (ref 6–20)
CHLORIDE: 101 mmol/L (ref 101–111)
CO2: 25 mmol/L (ref 22–32)
CREATININE: 0.67 mg/dL (ref 0.61–1.24)
Calcium: 8.5 mg/dL — ABNORMAL LOW (ref 8.9–10.3)
GFR calc non Af Amer: >60 mL/min (ref >60)
Glucose, Bld: 256 mg/dL — ABNORMAL HIGH (ref 65–99)
POTASSIUM: 3.8 mmol/L (ref 3.5–5.1)
Sodium: 135 mmol/L (ref 135–145)

## 2015-03-29 LAB — CBC
HEMATOCRIT: 36.9 % — AB (ref 39.0–52.0)
HEMOGLOBIN: 12.5 g/dL — AB (ref 13.0–17.0)
MCH: 29.6 pg (ref 26.0–34.0)
MCHC: 33.9 g/dL (ref 30.0–36.0)
MCV: 87.4 fL (ref 78.0–100.0)
PLATELETS: 219 10*3/uL (ref 150–400)
RBC: 4.22 MIL/uL (ref 4.22–5.81)
RDW: 13.3 % (ref 11.5–15.5)
WBC: 7.4 10*3/uL (ref 4.0–10.5)

## 2015-03-29 LAB — GLUCOSE, CAPILLARY: Glucose-Capillary: 250 mg/dL — ABNORMAL HIGH (ref 65–99)

## 2015-03-29 LAB — POCT ACTIVATED CLOTTING TIME: Activated Clotting Time: 177 seconds

## 2015-03-29 MED ORDER — CLOPIDOGREL BISULFATE 75 MG PO TABS: 75.0000 mg | ORAL_TABLET | Freq: Every day | ORAL | Status: DC

## 2015-03-29 NOTE — Discharge Instructions (Signed)

## 2015-03-29 NOTE — Discharge Summary (Signed)
CARDIOLOGY DISCHARGE SUMMARY   Patient ID: [name] MRN: [id number] DOB/AGE: 11-12-1955 59 y.o.  Admit date: 03/28/2015 Discharge date: 03/29/2015  PCP: No PCP Per Patient Primary Cardiologist: Dr Gwenlyn Found  Primary Discharge Diagnosis:    PVD (peripheral vascular disease) Secondary Discharge Diagnosis:    Claudication  Procedures Performed: 1. Abdominal aortogram, bilateral iliac angiogram, bifemoral runoff 2. Placement of spider distal protection device in the below-the-knee popliteal artery on the right 3. Talk 1 directional atherectomy right P1 segment of popliteal artery 4. Drug eluding balloon angioplasty right P1 segment popliteal artery   Hospital Course: KAIMANI CLAYSON is a 59 y.o. male with a history of MI back in 2002 with bypass grafting x4 at that time. He has had occluded graft by cath and has had EECP in the past. His other problems include remote tobacco abuse, diabetes with peripheral neuropathy, dyslipidemia and hypertension. He was evaluated by Dr Gwenlyn Found for claudication symptoms and scheduled for peripheral catheterization. He came to the hospital for the procedure on 03/28/2015.  Procedure results are below. He had directional atherectomy and balloon angioplasty of the right popliteal artery and tolerated the procedure well.  On 03/29/2015, he was seen by Dr. Martinique and all data were reviewed. His cath site was without significant ecchymosis or hematoma. He was having no chest pain or shortness of breath. His labs were stable. No further inpatient workup was indicated and he is considered stable for discharge, to follow-up as an outpatient.   BP 142/75 mmHg  Pulse 73  Temp(Src) 97.6 F (36.4 C) (Oral)  Resp 19  Ht 5\' 11"  (1.803 m)  Wt 233 lb 14.5 oz (106.1 kg)  BMI 32.64 kg/m2  SpO2 97% General: Well developed, well nourished, male in no acute distress Head: Eyes PERRLA, No xanthomas.   Normocephalic and  atraumatic  Lungs: Clear bilaterally to auscultation. Heart: HRRR S1 S2, without RG. Soft systolic murmur noted. No carotid bruit. No JVD. Abdomen: Bowel sounds are present, abdomen soft and non-tender without masses or  hernias noted. Msk: Normal strength and tone for age. Extremities: No clubbing, cyanosis or edema. Distal pulses are weak but palpable. Bilateral femoral bruits are soft but noted. There is no hematoma or ecchymosis at the left groin cath site. Skin:  No rashes or lesions noted. Neuro: Alert and oriented X 3. Psych:  Good affect, responds appropriately  Labs:   Lab Results  Component Value Date   WBC 7.4 03/29/2015   HGB 12.5* 03/29/2015   HCT 36.9* 03/29/2015   MCV 87.4 03/29/2015   PLT 219 03/29/2015     Recent Labs Lab 03/29/15 0334  NA 135  K 3.8  CL 101  CO2 25  BUN 14  CREATININE 0.67  CALCIUM 8.5*  GLUCOSE 256*   Peripheral Cath: 03/28/2015 1: Abdominal aortogram-distal dominant aorta was free of significant disease 2: Left lower extremity-there was no occluded left SFA in the midportion. There was a patent left femoral posterior tibial bypass graft which filled the peroneal retrograde. There was two-vessel runoff 3: Right lower extremity-60% focal mid right SFA, 50% segmental distal right SFA, 80-90% segmental P1 segment right popliteal artery, occluded peroneal IMPRESSION:Mr. Saralyn Pilar has and it occluded left SFA with a patent left femoral posterior tibial bypass graft. He does have high-grade right popliteal artery stenosis corresponding to his duplex ultrasound. Will proceed with directional atherectomy and drug-eluting balloon angioplasty Final Impression: successful Hawk 1 directional atherectomy, PTA using drug-eluting balloon of high-grade right P1  segment of popliteal artery stenosis for lifestyle limiting claudication. The patient tolerated the procedure well. The sheath will be removed once the ACT falls below 170 and pressure held. He'll be  hydrated overnight and will be treated with dual antiplatelets therapy. He'll be discharged on the morning and will obtain lower extremity arterial Doppler studies in our [place] office next week. I will see him back in the office in 2-3 weeks for follow-up. He left the lab in stable condition.   FOLLOW UP PLANS AND APPOINTMENTS No Known Allergies   Medication List    TAKE these medications        aspirin 325 MG tablet  Take 325 mg by mouth daily with breakfast.     clopidogrel 75 MG tablet  Commonly known as:  PLAVIX  Take 1 tablet (75 mg total) by mouth daily.     fenofibrate 145 MG tablet  Commonly known as:  TRICOR  Take 1 tablet (145 mg total) by mouth daily.     gabapentin 300 MG capsule  Commonly known as:  NEURONTIN  Take 300 mg by mouth 3 (three) times daily.     insulin aspart 100 UNIT/ML injection  Commonly known as:  novoLOG  Inject 15-20 Units into the skin 3 (three) times daily before meals.     insulin detemir 100 UNIT/ML injection  Commonly known as:  LEVEMIR  Inject 70 Units into the skin 2 (two) times daily.     lisinopril-hydrochlorothiazide 20-12.5 MG per tablet  Commonly known as:  PRINZIDE,ZESTORETIC  Take 1 tablet by mouth daily with breakfast.     metFORMIN 500 MG 24 hr tablet  Commonly known as:  GLUCOPHAGE-XR  Take 500 mg by mouth 2 (two) times daily.  Notes to Patient:  HOLD for 48 hours, restart on 04/01/2015     metoprolol succinate 25 MG 24 hr tablet  Commonly known as:  TOPROL-XL  Take 1 tablet (25 mg total) by mouth daily.     simvastatin 80 MG tablet  Commonly known as:  ZOCOR  Take 1 tablet (80 mg total) by mouth at bedtime.        Discharge Instructions    Diet - low sodium heart healthy    Complete by:  As directed      Diet Carb Modified    Complete by:  As directed      Increase activity slowly    Complete by:  As directed           Follow-up Information    Follow up with [facility] On 04/03/2015.   Why:   Arterial Dopplers at 1:30 pm, please arrive 15 minutes early for paperwork.   Contact information:   [address] [place] [address] [phone]      Follow up with Quay Burow, MD On 04/16/2015.   Specialties:  Cardiology, Radiology   Why:  See MD at 3:45 pm, please arrive 15 minutes early for paperwork.   Contact information:   [place] [address] [phone]       BRING ALL MEDICATIONS WITH YOU TO FOLLOW UP APPOINTMENTS  Time spent with patient to include physician time: 38 min Signed: Rosaria Ferries, PA-C 03/29/2015, 7:52 AM Co-Sign MD  Patient seen and examined and history reviewed. Agree with above findings and plan. Patient is doing well post PCI of the right popliteal stenosis. Continue ASA and Plavix. No groin hematoma. Vitals stable. Will plan DC today with follow up  dopplers in office. Will need to hold metformin for 2 days.  Jacari Iannello Martinique, [place] 03/29/2015 8:38 AM

## 2015-04-03 ENCOUNTER — Ambulatory Visit (HOSPITAL_COMMUNITY)
Admission: RE | Admit: 2015-04-03 | Discharge: 2015-04-03 | Disposition: A | Discharge status: Home / Self Care | Payer: BLUE CROSS/BLUE SHIELD | Source: Ambulatory Visit | Attending: Physician Assistant | Admitting: Physician Assistant

## 2015-04-03 ENCOUNTER — Other Ambulatory Visit: Payer: Self-pay | Admitting: Physician Assistant

## 2015-04-03 DIAGNOSIS — I739 Peripheral vascular disease, unspecified: Secondary | ICD-10-CM | POA: Insufficient documentation

## 2015-04-03 DIAGNOSIS — I70201 Unspecified atherosclerosis of native arteries of extremities, right leg: Secondary | ICD-10-CM | POA: Insufficient documentation

## 2015-04-03 DIAGNOSIS — E785 Hyperlipidemia, unspecified: Secondary | ICD-10-CM | POA: Insufficient documentation

## 2015-04-03 DIAGNOSIS — E119 Type 2 diabetes mellitus without complications: Secondary | ICD-10-CM | POA: Diagnosis not present

## 2015-04-03 DIAGNOSIS — I10 Essential (primary) hypertension: Secondary | ICD-10-CM | POA: Diagnosis not present

## 2015-04-16 ENCOUNTER — Encounter: Payer: Self-pay | Admitting: Cardiovascular Disease

## 2015-04-16 ENCOUNTER — Ambulatory Visit (INDEPENDENT_AMBULATORY_CARE_PROVIDER_SITE_OTHER): Payer: BLUE CROSS/BLUE SHIELD | Admitting: Cardiovascular Disease

## 2015-04-16 VITALS — BP 116/64 | HR 80 | Ht 71.0 in | Wt 231.0 lb

## 2015-04-16 DIAGNOSIS — I739 Peripheral vascular disease, unspecified: Secondary | ICD-10-CM | POA: Diagnosis not present

## 2015-04-16 DIAGNOSIS — E785 Hyperlipidemia, unspecified: Secondary | ICD-10-CM

## 2015-04-16 NOTE — Patient Instructions (Signed)
Medication Instructions:  Your physician recommends that you continue on your current medications as directed. Please refer to the Current Medication list given to you today.   Labwork: none  Testing/Procedures: Your physician has requested that you have a lower extremity arterial doppler- During this test, ultrasound is used to evaluate arterial blood flow in the legs. Allow approximately one hour for this exam.  IN 6 MONTHS  Follow-Up: Your physician wants you to follow-up in: [place]...AFTER HE HAS LEA COMPLETED. You will receive a reminder letter in the mail two months in advance. If you don't receive a letter, please call our office to schedule the follow-up appointment.   Any Other Special Instructions Will Be Listed Below (If Applicable).  Dr. Gwenlyn Found has referred you to: Dr. Lavetta Nielsen with [facility].

## 2015-04-16 NOTE — Assessment & Plan Note (Addendum)
[name] returns for follow-up of his recent lower extremity endovascular procedure that I performed on 03/28/15. He has a history of multiple attempts to revascularize a physician of his left SFA which ultimately failed resulting in left femoral posterior tibial bypass by Dr. Oneida Alar. He was complaining of right calf claudication at Doppler suggesting a popliteal lesion. Angiogram him revealing an 80% stenosis in the P1 segment of the right lower extremity and he underwent Hawk one directional atherectomy and drug-eluting balloon angioplasty.his Dopplers improved with an ABI that went from 0.79 2.84 as did his velocities. His claudication resolved.

## 2015-04-16 NOTE — Progress Notes (Signed)
04/16/2015 [name]   [birth year]  [id number]  Primary Physician No PCP Per Patient Primary Cardiologist: Lorretta Harp MD Renae Gloss   HPI:  Mr. Piercefield is a 59 year old, moderately overweight, widowed Caucasian male, father of 11, grandfather to 3 grandchildren who has a history of ischemic heart disease status post MI back in 2002 with bypass grafting x4 at that time. He has had occluded graft by cath and has had EECP in the past. His other problems include remote tobacco abuse, diabetes with peripheral neuropathy, dyslipidemia and hypertension. He also has peripheral vascular occlusive disease. I stented his left lower extremity several times, the most recent with a Viabahn endoprosthesis with an excellent result. This ultimated closed probably because of his failure to continue his Plavix. He ultimately underwent above-to-below-the-knee bypass grafting by Dr. Ruta Hinds which eventually closed as well. Dr. Oneida Alar recently did Dopplers on him revealing a right ABI of 1.03 and a left of 0.66. His last Myoview performed Dec 12, 2011, revealed inferolateral thinning towards the apex. He is otherwise asymptomatic.his lipids are followed by Dr. Chalmers Cater, his endocrinologist. He did have a negative Myoview in August of last year prior to orthopedic surgery. He denies chest pain but does complain of increasing dyspnea on exertion. Recent lower extremity Dopplers performed by Dr. Oneida Alar revealed a right ABI of 0.76 and a left of 0.61. He does complain of bilateral lower extremity lifestyle limiting claudication. Recent Dopplers performed 02/05/15 revealed a right ABI 0.79 with a high frequency signal in the right popliteal artery and one-vessel runoff, left ABI of 0.66 with an occluded popliteal artery. A 2-D echo and Myoview stress test were unrevealing. I performed angiography on him 03/28/15 revealing an occluded left SFA with a patent left phlegm to posterior tibial bypass. He had an 80%  stenosis in the P1 segment of the right lower extremity with two-vessel runoff (occluded peroneal). I performed [facility]  one directional atherectomy followed by drug eluding financial plasty with excellent angiographic and clinical result. His Dopplers improved and his claudication resolved.  Current Outpatient Prescriptions  Medication Sig Dispense Refill  . aspirin 325 MG tablet Take 325 mg by mouth daily with breakfast.     . clopidogrel (PLAVIX) 75 MG tablet Take 1 tablet (75 mg total) by mouth daily. 30 tablet 11  . fenofibrate (TRICOR) 145 MG tablet Take 1 tablet (145 mg total) by mouth daily. 90 tablet 3  . gabapentin (NEURONTIN) 300 MG capsule Take 300 mg by mouth 3 (three) times daily.     . insulin aspart (NOVOLOG) 100 UNIT/ML injection Inject 15-20 Units into the skin 3 (three) times daily before meals.    . insulin detemir (LEVEMIR) 100 UNIT/ML injection Inject 70 Units into the skin 2 (two) times daily.    [name] lisinopril-hydrochlorothiazide (PRINZIDE,ZESTORETIC) 20-12.5 MG per tablet Take 1 tablet by mouth daily with breakfast.     . metFORMIN (GLUCOPHAGE-XR) 500 MG 24 hr tablet Take 500 mg by mouth 2 (two) times daily.    . metoprolol succinate (TOPROL-XL) 25 MG 24 hr tablet Take 1 tablet (25 mg total) by mouth daily. 30 tablet 6  . simvastatin (ZOCOR) 80 MG tablet Take 1 tablet (80 mg total) by mouth at bedtime. 90 tablet 3   No current facility-administered medications for this visit.    No Known Allergies  Social History   Social History  . Marital Status: Widowed    Spouse Name: N/A  . Number of Children: N/A  .  Years of Education: N/A   Occupational History  . Not on file.   Social History Main Topics  . Smoking status: Never Smoker   . Smokeless tobacco: Never Used  . Alcohol Use: No  . Drug Use: No  . Sexual Activity: Yes   Other Topics Concern  . Not on file   Social History Narrative     Review of Systems: General: negative for chills, fever, night  sweats or weight changes.  Cardiovascular: negative for chest pain, dyspnea on exertion, edema, orthopnea, palpitations, paroxysmal nocturnal dyspnea or shortness of breath Dermatological: negative for rash Respiratory: negative for cough or wheezing Urologic: negative for hematuria Abdominal: negative for nausea, vomiting, diarrhea, bright red blood per rectum, melena, or hematemesis Neurologic: negative for visual changes, syncope, or dizziness All other systems reviewed and are otherwise negative except as noted above.    Blood pressure 116/64, pulse 80, height 5\' 11"  (1.803 m), weight 231 lb (104.781 kg).  General appearance: alert and no distress Neck: no adenopathy, no carotid bruit, no JVD, supple, symmetrical, trachea midline and thyroid not enlarged, symmetric, no tenderness/mass/nodules Lungs: clear to auscultation bilaterally Heart: regular rate and rhythm, S1, S2 normal, no murmur, click, rub or gallop Extremities: 2+ bipedal pulse  EKG not performed today  ASSESSMENT AND PLAN:   PAD (peripheral artery disease) Mr. Sleeth returns for follow-up of his recent lower extremity endovascular procedure that I performed on 03/28/15. He has a history of multiple attempts to revascularize a physician of his left SFA which ultimately failed resulting in left femoral posterior tibial bypass by Dr. Oneida Alar. He was complaining of right calf claudication at Doppler suggesting a popliteal lesion. Angiogram him revealing an 80% stenosis in the P1 segment of the right lower extremity and he underwent Hawk one directional atherectomy and drug-eluting balloon angioplasty.his Dopplers improved with an ABI that went from 0.79 2.84 as did his velocities. His claudication resolved.  Hyperlipidemia History of hyperlipidemia of fenofibrate and high-dose statin therapy with recent lipid profile performed 01/16/15 revealed a total cholesterol 500, tenderness or level of 1789 and HDL of 38. I'm going to refer him  to Dr. Arther Dames at [facility] for further evaluation and treatment.      Lorretta Harp MD FACP,FACC,FAHA, FSCAI 04/16/2015 4:00 PM

## 2015-04-16 NOTE — Assessment & Plan Note (Signed)
History of hyperlipidemia of fenofibrate and high-dose statin therapy with recent lipid profile performed 01/16/15 revealed a total cholesterol 500, tenderness or level of 1789 and HDL of 38. I'm going to refer him to Dr. Arther Dames at [facility] for further evaluation and treatment.

## 2015-04-17 ENCOUNTER — Telehealth: Payer: Self-pay | Admitting: *Deleted

## 2015-04-17 NOTE — Telephone Encounter (Signed)
Called and gave patient appointment date, time location ([address]--[place], [place]?) for appointment with Dr. Lavetta Nielsen.  Patient was also given phone number if he needs to reschedule--[phone].  Patient should receive a new patient packet in the mail with appointment information and directions.  Patient voiced his understanding.

## 2015-07-04 ENCOUNTER — Encounter (HOSPITAL_COMMUNITY): Payer: BLUE CROSS/BLUE SHIELD

## 2015-07-04 ENCOUNTER — Ambulatory Visit: Payer: BLUE CROSS/BLUE SHIELD | Admitting: Family

## 2015-07-25 ENCOUNTER — Other Ambulatory Visit: Payer: Self-pay | Admitting: Cardiovascular Disease

## 2015-07-25 DIAGNOSIS — I739 Peripheral vascular disease, unspecified: Secondary | ICD-10-CM

## 2015-07-25 DIAGNOSIS — E785 Hyperlipidemia, unspecified: Secondary | ICD-10-CM

## 2015-08-02 ENCOUNTER — Encounter: Payer: Self-pay | Admitting: Family

## 2015-08-07 ENCOUNTER — Other Ambulatory Visit: Payer: Self-pay | Admitting: *Deleted

## 2015-08-07 DIAGNOSIS — I739 Peripheral vascular disease, unspecified: Secondary | ICD-10-CM

## 2015-08-08 ENCOUNTER — Ambulatory Visit (HOSPITAL_COMMUNITY)
Admission: RE | Admit: 2015-08-08 | Discharge: 2015-08-08 | Disposition: A | Discharge status: Home / Self Care | Payer: BLUE CROSS/BLUE SHIELD | Source: Ambulatory Visit | Attending: Family | Admitting: Family

## 2015-08-08 ENCOUNTER — Other Ambulatory Visit: Payer: Self-pay

## 2015-08-08 ENCOUNTER — Ambulatory Visit (INDEPENDENT_AMBULATORY_CARE_PROVIDER_SITE_OTHER): Payer: BLUE CROSS/BLUE SHIELD | Admitting: Vascular Surgery

## 2015-08-08 ENCOUNTER — Encounter: Payer: Self-pay | Admitting: Family

## 2015-08-08 VITALS — BP 135/74 | HR 78 | Temp 98.6°F | Resp 16 | Ht 71.0 in | Wt 241.0 lb

## 2015-08-08 DIAGNOSIS — I739 Peripheral vascular disease, unspecified: Secondary | ICD-10-CM | POA: Diagnosis not present

## 2015-08-08 NOTE — Progress Notes (Signed)
Patient is a 60 year old male who returns for followup today. He has known peripheral arterial disease. He previously underwent multiple peripheral interventions in his lower extremities by Dr. Gwenlyn Found. He ultimately ended up with a left leg bypass which failed was thought to have failed early. He has previously had vein harvested from the right leg in the left leg. He describes numbness and tingling in both legs extending from the knee down that the foot. He also describes pain in his calf which may represent some element of claudication. He does not really describe classic rest pain. However, he does complain of worsening pain overall in the left leg and foot. He states this is been slowly progressive but does not really recall if this is been worse since his arteriogram in September. He has no nonhealing wounds on his feet. He is not smoking. He has changed his diet significantly to try to improve his diabetes but still reports his glucoses regularly greater than 200.  He does get some shortness of breath at a mile and this occurs certainly before any claudication symptoms.   He recently had atherectomy and drug coated balloon angioplasty of his right popliteal artery by Dr. Gwenlyn Found.  Review of systems: Dyspnea with exertion as mentioned above. He denies chest pain.  Physical exam:  Filed Vitals:   08/08/15 1144  BP: 135/74  Pulse: 78  Temp: 98.6 F (37 C)  TempSrc: Oral  Resp: 16  Height: 5\' 11"  (1.803 m)  Weight: 241 lb (109.317 kg)  SpO2: 98%      Neck: No carotid bruits  Chest: Clear to auscultation bilaterally  Cardiac: Regular rate and rhythm without murmur  Abdomen: Soft nontender slightly obese  Extremities: 2+ femoral pulses bilaterally with absent popliteal and pedal pulses  Skin: No ulcers  Data:  Patient had bilateral ABIs today which I reviewed and interpreted. ABI on the right was 0.71 left was 0.38.  September 2016 he was 0.7 on the left and 0.8 on the right I  reviewed the patient's arteriogram by Dr. Gwenlyn Found from September 2016. This actually showed that the left femoral to posterior tibial artery bypass is widely patent rather than occluded.  Assessment: Patient with worsening symptoms in his left foot.  His posterior tibial artery bypass in the left leg was patent as of 4 months ago. The fact that his ABIs have decreased by 50% are concerning that his bypass may have occluded at this point.  Plan: Aortogram bilateral lower extremity runoff possible intervention scheduled for 08/23/2015. Risks benefits possible, locations and procedure details were discussed with the patient today including not limited to bleeding infection vessel injury contrast reaction. He understands and agrees to proceed. If we find that his bypass is patent and most likely return to regular scheduled follow-up. If his bypass is occluded we will need to consider whether or not he is a candidate for redo bypass.  Ruta Hinds, MD [facility] Office: [phone] Pager: [phone]

## 2015-08-23 ENCOUNTER — Telehealth: Payer: Self-pay | Admitting: Vascular Surgery

## 2015-08-23 ENCOUNTER — Encounter (HOSPITAL_COMMUNITY)
Admission: RE | Disposition: A | Discharge status: Home / Self Care | Payer: Self-pay | Source: Ambulatory Visit | Attending: Vascular Surgery

## 2015-08-23 ENCOUNTER — Other Ambulatory Visit: Payer: Self-pay | Admitting: *Deleted

## 2015-08-23 ENCOUNTER — Ambulatory Visit (HOSPITAL_COMMUNITY)
Admission: RE | Admit: 2015-08-23 | Discharge: 2015-08-23 | Disposition: A | Discharge status: Home / Self Care | Payer: BLUE CROSS/BLUE SHIELD | Source: Ambulatory Visit | Attending: Vascular Surgery | Admitting: Vascular Surgery

## 2015-08-23 DIAGNOSIS — I70222 Atherosclerosis of native arteries of extremities with rest pain, left leg: Secondary | ICD-10-CM | POA: Diagnosis not present

## 2015-08-23 DIAGNOSIS — Z01810 Encounter for preprocedural cardiovascular examination: Secondary | ICD-10-CM

## 2015-08-23 DIAGNOSIS — I739 Peripheral vascular disease, unspecified: Secondary | ICD-10-CM

## 2015-08-23 DIAGNOSIS — I70223 Atherosclerosis of native arteries of extremities with rest pain, bilateral legs: Secondary | ICD-10-CM | POA: Insufficient documentation

## 2015-08-23 HISTORY — PX: PERIPHERAL VASCULAR CATHETERIZATION: SHX172C

## 2015-08-23 LAB — POCT I-STAT, CHEM 8
BUN: 18 mg/dL (ref 6–20)
CREATININE: 0.8 mg/dL (ref 0.61–1.24)
Calcium, Ion: 1.1 mmol/L — ABNORMAL LOW (ref 1.13–1.30)
Chloride: 102 mmol/L (ref 101–111)
Glucose, Bld: 342 mg/dL — ABNORMAL HIGH (ref 65–99)
HEMATOCRIT: 39 % (ref 39.0–52.0)
HEMOGLOBIN: 13.3 g/dL (ref 13.0–17.0)
POTASSIUM: 4.2 mmol/L (ref 3.5–5.1)
SODIUM: 135 mmol/L (ref 135–145)
TCO2: 24 mmol/L (ref 0–100)

## 2015-08-23 LAB — GLUCOSE, CAPILLARY
GLUCOSE-CAPILLARY: 278 mg/dL — AB (ref 65–99)
Glucose-Capillary: 252 mg/dL — ABNORMAL HIGH (ref 65–99)

## 2015-08-23 SURGERY — ABDOMINAL AORTOGRAM
Anesthesia: LOCAL

## 2015-08-23 MED ORDER — INSULIN ASPART 100 UNIT/ML ~~LOC~~ SOLN
10.0000 [IU] | Freq: Once | SUBCUTANEOUS | Status: AC
  Administered 2015-08-23: 10 [IU] via SUBCUTANEOUS

## 2015-08-23 MED ORDER — SODIUM CHLORIDE 0.45 % IV SOLN: INTRAVENOUS | Status: DC

## 2015-08-23 MED ORDER — ONDANSETRON HCL 4 MG/2ML IJ SOLN: 4.0000 mg | Freq: Four times a day (QID) | INTRAMUSCULAR | Status: DC | PRN

## 2015-08-23 MED ORDER — METOPROLOL TARTRATE 1 MG/ML IV SOLN: 2.0000 mg | INTRAVENOUS | Status: DC | PRN

## 2015-08-23 MED ORDER — HYDRALAZINE HCL 20 MG/ML IJ SOLN: 5.0000 mg | INTRAMUSCULAR | Status: DC | PRN

## 2015-08-23 MED ORDER — LABETALOL HCL 5 MG/ML IV SOLN: 10.0000 mg | INTRAVENOUS | Status: DC | PRN

## 2015-08-23 MED ORDER — INSULIN ASPART 100 UNIT/ML ~~LOC~~ SOLN
SUBCUTANEOUS | Status: AC
  Administered 2015-08-23: 10 [IU] via SUBCUTANEOUS
  Filled 2015-08-23: qty 1

## 2015-08-23 MED ORDER — LIDOCAINE HCL (PF) 1 % IJ SOLN
INTRAMUSCULAR | Status: DC | PRN
  Administered 2015-08-23: 11 mL

## 2015-08-23 MED ORDER — OXYCODONE HCL 5 MG PO TABS: 5.0000 mg | ORAL_TABLET | ORAL | Status: DC | PRN

## 2015-08-23 MED ORDER — MORPHINE SULFATE (PF) 10 MG/ML IV SOLN
2.0000 mg | INTRAVENOUS | Status: DC | PRN
Start: 2015-08-23 — End: 2015-08-23

## 2015-08-23 MED ORDER — ACETAMINOPHEN 325 MG PO TABS: 325.0000 mg | ORAL_TABLET | ORAL | Status: DC | PRN

## 2015-08-23 MED ORDER — SODIUM CHLORIDE 0.9 % IV SOLN
INTRAVENOUS | Status: DC
  Administered 2015-08-23: 09:00:00 via INTRAVENOUS

## 2015-08-23 MED ORDER — ACETAMINOPHEN 325 MG RE SUPP: 325.0000 mg | RECTAL | Status: DC | PRN

## 2015-08-23 SURGICAL SUPPLY — 9 items
CATH ANGIO 5F PIGTAIL 65CM (CATHETERS) ×2 IMPLANT
COVER PRB 48X5XTLSCP FOLD TPE (BAG) ×1 IMPLANT
COVER PROBE 5X48 (BAG) ×1
KIT PV (KITS) ×2 IMPLANT
SHEATH PINNACLE 5F 10CM (SHEATH) ×2 IMPLANT
SYR MEDRAD MARK V 150ML (SYRINGE) ×2 IMPLANT
TRANSDUCER W/STOPCOCK (MISCELLANEOUS) ×2 IMPLANT
TRAY PV CATH (CUSTOM PROCEDURE TRAY) ×2 IMPLANT
WIRE HITORQ VERSACORE ST 145CM (WIRE) ×2 IMPLANT

## 2015-08-23 NOTE — Op Note (Signed)
Procedure: Abdominal aortogram with bilateral lower extremity runoff  Preoperative diagnosis: Rest pain left foot postoperative diagnosis: Same Anesthesia: Local  Operative findings: #1 occlusion left common femoral to posterior tibial artery bypass #2 high-grade stenosis right above-knee and distal popliteal artery greater than 70% two-vessel runoff right foot anterior posterior tibial artery  Operative details: After obtaining informed consent, the patient was taken PV lab. The patient was placed in supine position the Angio table. Both groins were prepped and draped in usual sterile fashion. Local anesthesia was insured of the right common femoral artery. Ultrasound was used to identify the right common femoral artery. An introducer needle was used to cannulate the right common femoral artery under ultrasound guidance. An 035 versacore wire was then threaded up the abdominal aorta under fluoroscopic guidance. A 5 French sheath was placed over the guidewire the right common femoral artery. This was thoroughly flushed with heparinized saline. A 5 French pigtail catheter was then placed over the guidewire and advanced into the abdominal aorta. Abdominal aortogram was obtained. Left and right renal arteries are widely patent. The infrarenal abdominal aorta is patent. The left and right common external and internal iliac arteries are all widely patent.  Next the pectoral catheter was pulled down just above the bifurcation and a pelvic Angio Phillip Heal obtained confirming the above findings. We then proceeded to do lower extremity runoff views through the pigtail catheter.  In the left lower extremity, the left common femoral artery is patent. The left profunda femoris artery is patent. A previously placed left femoral to posterior tibial artery bypass is occluded throughout its entirety. The left superficial femoral artery is patent. However this occludes at the adductor hiatus. The popliteal artery is  occluded throughout its course. There is reconstitution of a left posterior tibial artery approximately 10 cm above the ankle.  In the right lower extremity, the right common femoral artery is patent. The right profunda femoris and superficial femoral arteries are patent. The right posterior tibial artery has a 70% stenosis above the below-knee popliteal artery is diffusely diseased proximal 70%. There are two-vessel runoff via the anterior tibial and posterior tibial arteries. The posterior tibial artery is small and very diseased. The anterior tibial artery is a dominant runoff vessel to the right foot.  At this point because catheter was pulled back over the guidewire. The 5 French sheath was thoroughly flushed with heparinized saline. The patient was taken to the holding area to have the sheath removed in the holding area. The patient tolerated procedure well and there were no complications. The patient was taken to the holding area in stable condition.  Operative management: The patient will have an evaluation of his upper extremities with vein mapping to see if he has adequate conduit to redo his left leg bypass which would be a superficial femoral artery to posterior tibial artery bypass. We will also have him have a cardiac evaluation prior to this.  Ruta Hinds, MD [facility] Office: [phone] Pager: [phone]

## 2015-08-23 NOTE — Interval H&P Note (Signed)
History and Physical Interval Note:  08/23/2015 8:40 AM  [name]  has presented today for surgery, with the diagnosis of pvd  The various methods of treatment have been discussed with the patient and family. After consideration of risks, benefits and other options for treatment, the patient has consented to  Procedure(s): Abdominal Aortogram (N/A) as a surgical intervention .  The patient's history has been reviewed, patient examined, no change in status, stable for surgery.  I have reviewed the patient's chart and labs.  Questions were answered to the patient's satisfaction.     Ruta Hinds

## 2015-08-23 NOTE — Telephone Encounter (Signed)
LM for pt with both appts"  Vein Mapping: 08/26/15 @ 2:00pm Dr Gwenlyn Found: 08/30/15 @ 10:45am

## 2015-08-23 NOTE — H&P (View-Only) (Signed)
Patient is a 60 year old male who returns for followup today. He has known peripheral arterial disease. He previously underwent multiple peripheral interventions in his lower extremities by Dr. Gwenlyn Found. He ultimately ended up with a left leg bypass which failed was thought to have failed early. He has previously had vein harvested from the right leg in the left leg. He describes numbness and tingling in both legs extending from the knee down that the foot. He also describes pain in his calf which may represent some element of claudication. He does not really describe classic rest pain. However, he does complain of worsening pain overall in the left leg and foot. He states this is been slowly progressive but does not really recall if this is been worse since his arteriogram in September. He has no nonhealing wounds on his feet. He is not smoking. He has changed his diet significantly to try to improve his diabetes but still reports his glucoses regularly greater than 200.  He does get some shortness of breath at a mile and this occurs certainly before any claudication symptoms.   He recently had atherectomy and drug coated balloon angioplasty of his right popliteal artery by Dr. Gwenlyn Found.  Review of systems: Dyspnea with exertion as mentioned above. He denies chest pain.  Physical exam:  Filed Vitals:   08/08/15 1144  BP: 135/74  Pulse: 78  Temp: 98.6 F (37 C)  TempSrc: Oral  Resp: 16  Height: 5\' 11"  (1.803 m)  Weight: 241 lb (109.317 kg)  SpO2: 98%      Neck: No carotid bruits  Chest: Clear to auscultation bilaterally  Cardiac: Regular rate and rhythm without murmur  Abdomen: Soft nontender slightly obese  Extremities: 2+ femoral pulses bilaterally with absent popliteal and pedal pulses  Skin: No ulcers  Data:  Patient had bilateral ABIs today which I reviewed and interpreted. ABI on the right was 0.71 left was 0.38.  September 2016 he was 0.7 on the left and 0.8 on the right I  reviewed the patient's arteriogram by Dr. Gwenlyn Found from September 2016. This actually showed that the left femoral to posterior tibial artery bypass is widely patent rather than occluded.  Assessment: Patient with worsening symptoms in his left foot.  His posterior tibial artery bypass in the left leg was patent as of 4 months ago. The fact that his ABIs have decreased by 50% are concerning that his bypass may have occluded at this point.  Plan: Aortogram bilateral lower extremity runoff possible intervention scheduled for 08/23/2015. Risks benefits possible, locations and procedure details were discussed with the patient today including not limited to bleeding infection vessel injury contrast reaction. He understands and agrees to proceed. If we find that his bypass is patent and most likely return to regular scheduled follow-up. If his bypass is occluded we will need to consider whether or not he is a candidate for redo bypass.  Ruta Hinds, MD [facility] Office: [phone] Pager: [phone]

## 2015-08-23 NOTE — Discharge Instructions (Signed)
Do not Restart Metformin until Monday 08-26-2015  Angiogram, Care After These instructions give you information about caring for yourself after your procedure. Your doctor may also give you more specific instructions. Call your doctor if you have any problems or questions after your procedure.  HOME CARE  Take medicines only as told by your doctor.  Follow your doctor's instructions about:  Care of the area where the tube was inserted.  Bandage (dressing) changes and removal.  You may shower 24-48 hours after the procedure or as told by your doctor.  Do not take baths, swim, or use a hot tub until your doctor approves.  Every day, check the area where the tube was inserted. Watch for:  Redness, swelling, or pain.  Fluid, blood, or pus.  Do not apply powder or lotion to the site.  Do not lift anything that is heavier than 10 lb (4.5 kg) for 5 days or as told by your doctor.  Ask your doctor when you can:  Return to work or school.  Do physical activities or play sports.  Have sex.  Do not drive or operate heavy machinery for 24 hours or as told by your doctor.  Have someone with you for the first 24 hours after the procedure.  Keep all follow-up visits as told by your doctor. This is important. GET HELP IF:  You have a fever.   You have chills.   You have more bleeding from the area where the tube was inserted. Hold pressure on the area.  You have redness, swelling, or pain in the area where the tube was inserted.  You have fluid or pus coming from the area. GET HELP RIGHT AWAY IF:   You have a lot of pain in the area where the tube was inserted.  The area where the tube was inserted is bleeding, and the bleeding does not stop after 30 minutes of holding steady pressure on the area.  The area near or just beyond the insertion site becomes pale, cool, tingly, or numb.   This information is not intended to replace advice given to you by your health care  provider. Make sure you discuss any questions you have with your health care provider.   Document Released: 10/02/2008 Document Revised: 07/27/2014 Document Reviewed: 12/07/2012 Elsevier Interactive Patient Education [employer].

## 2015-08-23 NOTE — Telephone Encounter (Signed)
-----   Message from Mena Goes, RN sent at 08/23/2015 11:50 AM EST ----- Regarding: Surgery preop appt   ----- Message -----    From: Elam Dutch, MD    Sent: 08/23/2015  11:33 AM      To: [facility] Charge Pool  Aortogram with bilat runoff Right groin [place]  He needs redo left SFA to PT bypass He needs cardiac eval by Gwenlyn Found first  He also needs bilateral arm vein mapping in our vascular lab preop cephalic and basilic vein  Please schedule after cardiac eval and vein map done  [employer]

## 2015-08-26 ENCOUNTER — Encounter (HOSPITAL_COMMUNITY): Payer: Self-pay | Admitting: Vascular Surgery

## 2015-08-26 ENCOUNTER — Ambulatory Visit (HOSPITAL_COMMUNITY)
Admit: 2015-08-26 | Discharge: 2015-08-26 | Disposition: A | Discharge status: Home / Self Care | Payer: BLUE CROSS/BLUE SHIELD | Attending: Surgery | Admitting: Surgery

## 2015-08-26 ENCOUNTER — Other Ambulatory Visit: Payer: Self-pay | Admitting: *Deleted

## 2015-08-26 DIAGNOSIS — I739 Peripheral vascular disease, unspecified: Secondary | ICD-10-CM

## 2015-08-26 DIAGNOSIS — Z951 Presence of aortocoronary bypass graft: Secondary | ICD-10-CM | POA: Insufficient documentation

## 2015-08-26 DIAGNOSIS — Z01810 Encounter for preprocedural cardiovascular examination: Secondary | ICD-10-CM | POA: Diagnosis not present

## 2015-08-26 DIAGNOSIS — I70222 Atherosclerosis of native arteries of extremities with rest pain, left leg: Secondary | ICD-10-CM

## 2015-08-26 MED ORDER — HYDROCODONE-ACETAMINOPHEN 5-325 MG PO TABS: 1.0000 | ORAL_TABLET | ORAL | Status: DC | PRN

## 2015-08-30 ENCOUNTER — Encounter: Payer: Self-pay | Admitting: Cardiovascular Disease

## 2015-08-30 ENCOUNTER — Other Ambulatory Visit: Payer: Self-pay

## 2015-08-30 ENCOUNTER — Ambulatory Visit (INDEPENDENT_AMBULATORY_CARE_PROVIDER_SITE_OTHER): Payer: BLUE CROSS/BLUE SHIELD | Admitting: Cardiovascular Disease

## 2015-08-30 VITALS — BP 120/68 | HR 60 | Ht 71.0 in | Wt 242.4 lb

## 2015-08-30 DIAGNOSIS — E785 Hyperlipidemia, unspecified: Secondary | ICD-10-CM | POA: Diagnosis not present

## 2015-08-30 DIAGNOSIS — I10 Essential (primary) hypertension: Secondary | ICD-10-CM

## 2015-08-30 DIAGNOSIS — I739 Peripheral vascular disease, unspecified: Secondary | ICD-10-CM | POA: Diagnosis not present

## 2015-08-30 DIAGNOSIS — I2510 Atherosclerotic heart disease of native coronary artery without angina pectoris: Secondary | ICD-10-CM | POA: Diagnosis not present

## 2015-08-30 DIAGNOSIS — I2583 Coronary atherosclerosis due to lipid rich plaque: Secondary | ICD-10-CM

## 2015-08-30 NOTE — Assessment & Plan Note (Signed)
History of peripheral arterial disease status post left above the below the knee bypass grafting by Dr. Oneida Alar. I recently performed directional atherectomy and drug-eluting balloon angioplasty on his right popliteal artery 03/28/15 with excellent angiographic and Doppler result which resulted in improvement in his claudication. Dr. Oneida Alar recently angiogramed him as well showing an occluded left femoropopliteal bypass graft. Patient is symptomatic and is scheduled to have this read done next week. He had a low risk Myoview in July of last year and will be cleared at low risk.

## 2015-08-30 NOTE — Patient Instructions (Signed)
Cleared low risk for surgery    Your physician wants you to follow-up in: 12 months. You will receive a reminder letter in the mail two months in advance. If you don't receive a letter, please call our office to schedule the follow-up appointment.

## 2015-08-30 NOTE — Assessment & Plan Note (Signed)
History of hyperlipidemia on statin therapy with recent lipid profile performed 01/16/15 revealed a total cholesterol 500, triglyceride level of 1769 and HDL 38. He is scheduled to see Dr. Lavetta Nielsen at [facility] who is a lipid office for further evaluation and treatment

## 2015-08-30 NOTE — Progress Notes (Signed)
08/30/2015 [name]   Aug 26, 1955  [id number]  Primary Physician No PCP Per Patient Primary Cardiologist: Lorretta Harp MD Renae Gloss   HPI:  [name] is a 60 year old, moderately overweight, widowed Caucasian male, father of 6, grandfather to 3 grandchildren who has a history of ischemic heart disease status post MI back in 2002 with bypass grafting x4 at that time. I last saw him in the office 04/16/15.He has had occluded graft by cath and has had EECP in the past. His other problems include remote tobacco abuse, diabetes with peripheral neuropathy, dyslipidemia and hypertension. He also has peripheral vascular occlusive disease. I stented his left lower extremity several times, the most recent with a Viabahn endoprosthesis with an excellent result. This ultimated closed probably because of his failure to continue his Plavix. He ultimately underwent above-to-below-the-knee bypass grafting by Dr. Ruta Hinds which eventually closed as well. Dr. Oneida Alar recently did Dopplers on him revealing a right ABI of 1.03 and a left of 0.66. His last Myoview performed Dec 12, 2011, revealed inferolateral thinning towards the apex. He is otherwise asymptomatic.his lipids are followed by Dr. Chalmers Cater, his endocrinologist. He did have a negative Myoview in August of last year prior to orthopedic surgery. He denies chest pain but does complain of increasing dyspnea on exertion. Recent lower extremity Dopplers performed by Dr. Oneida Alar revealed a right ABI of 0.76 and a left of 0.61. He does complain of bilateral lower extremity lifestyle limiting claudication. Recent Dopplers performed 02/05/15 revealed a right ABI 0.79 with a high frequency signal in the right popliteal artery and one-vessel runoff, left ABI of 0.66 with an occluded popliteal artery. A 2-D echo and Myoview stress test were unrevealing. I performed angiography on him 03/28/15 revealing an occluded left SFA with a patent left phlegm to  posterior tibial bypass. He had an 80% stenosis in the P1 segment of the right lower extremity with two-vessel runoff (occluded peroneal). I performed [facility] one directional atherectomy followed by drug eluding balloon angioplasty  with excellent angiographic and clinical result. His Dopplers improved and his claudication resolved. Here he had antrum by Dr. Oneida Alar that revealed a patent popliteal artery on the right with occluded left femoropopliteal bypass graft and is scheduled for redo surgery next week. He denies chest pain or shortness of breath. Given his recent negative Myoview stress test I'm clearing him for surgery at low risk.   Current Outpatient Prescriptions  Medication Sig Dispense Refill  . aspirin 325 MG tablet Take 325 mg by mouth daily with breakfast.     . clopidogrel (PLAVIX) 75 MG tablet Take 1 tablet (75 mg total) by mouth daily. 30 tablet 11  . fenofibrate (TRICOR) 145 MG tablet Take 1 tablet (145 mg total) by mouth daily. 90 tablet 3  . gabapentin (NEURONTIN) 300 MG capsule Take 300 mg by mouth 3 (three) times daily.     [name] HYDROcodone-acetaminophen (NORCO/VICODIN) 5-325 MG tablet Take 1 tablet by mouth every 4 (four) hours as needed. 50 tablet 0  . insulin aspart (NOVOLOG) 100 UNIT/ML injection Inject 35 Units into the skin 3 (three) times daily before meals.     [name] LEVEMIR FLEXTOUCH 100 UNIT/ML Pen Inject 75 Units into the skin daily at 10 pm. Reported on 08/21/2015  4  . lisinopril-hydrochlorothiazide (PRINZIDE,ZESTORETIC) 20-12.5 MG per tablet Take 1 tablet by mouth daily with breakfast.     . metFORMIN (GLUCOPHAGE-XR) 500 MG 24 hr tablet Take 500 mg by mouth 2 (two)  times daily.    . metoprolol succinate (TOPROL-XL) 25 MG 24 hr tablet Take 1 tablet (25 mg total) by mouth daily. 30 tablet 6  . simvastatin (ZOCOR) 80 MG tablet Take 1 tablet (80 mg total) by mouth at bedtime. 90 tablet 3   No current facility-administered medications for this visit.    No Known  Allergies  Social History   Social History  . Marital Status: Widowed    Spouse Name: N/A  . Number of Children: N/A  . Years of Education: N/A   Occupational History  . Not on file.   Social History Main Topics  . Smoking status: Never Smoker   . Smokeless tobacco: Never Used  . Alcohol Use: No  . Drug Use: No  . Sexual Activity: Yes   Other Topics Concern  . Not on file   Social History Narrative     Review of Systems: General: negative for chills, fever, night sweats or weight changes.  Cardiovascular: negative for chest pain, dyspnea on exertion, edema, orthopnea, palpitations, paroxysmal nocturnal dyspnea or shortness of breath Dermatological: negative for rash Respiratory: negative for cough or wheezing Urologic: negative for hematuria Abdominal: negative for nausea, vomiting, diarrhea, bright red blood per rectum, melena, or hematemesis Neurologic: negative for visual changes, syncope, or dizziness All other systems reviewed and are otherwise negative except as noted above.    Blood pressure 120/68, pulse 60, height 5\' 11"  (1.803 m), weight 242 lb 7 oz (109.969 kg).  General appearance: alert and no distress Neck: no adenopathy, no carotid bruit, no JVD, supple, symmetrical, trachea midline and thyroid not enlarged, symmetric, no tenderness/mass/nodules Lungs: clear to auscultation bilaterally Heart: regular rate and rhythm, S1, S2 normal, no murmur, click, rub or gallop Extremities: extremities normal, atraumatic, no cyanosis or edema  EKG normal sinus rhythm at 61 with nonspecific ST and T-wave changes. I personally reviewed his EKG  ASSESSMENT AND PLAN:   PAD (peripheral artery disease) History of peripheral arterial disease status post left above the below the knee bypass grafting by Dr. Oneida Alar. I recently performed directional atherectomy and drug-eluting balloon angioplasty on his right popliteal artery 03/28/15 with excellent angiographic and Doppler  result which resulted in improvement in his claudication. Dr. Oneida Alar recently angiogramed him as well showing an occluded left femoropopliteal bypass graft. Patient is symptomatic and is scheduled to have this read done next week. He had a low risk Myoview in July of last year and will be cleared at low risk.  Essential hypertension History of hypertension blood pressure measured today at 120/68. He is on lisinopril, hydrochlorothiazide and metoprolol. Continue current meds at current dosing  Hyperlipidemia History of hyperlipidemia on statin therapy with recent lipid profile performed 01/16/15 revealed a total cholesterol 500, triglyceride level of 1769 and HDL 38. He is scheduled to see [facility] Lavetta [facility] at [facility] who is a lipid office for further evaluation and treatment      Lorretta Harp MD [facility], [facility] 08/30/2015 11:33 AM

## 2015-08-30 NOTE — Assessment & Plan Note (Signed)
History of coronary artery disease status post myocardial infarction in 2002 with bypass grafting at that time. He's had an occluded graft by EECP in the past. His Last Myoview Stress Test Performed 02/11/15 Was Nonischemic.

## 2015-08-30 NOTE — Assessment & Plan Note (Signed)
History of hypertension blood pressure measured today at 120/68. He is on lisinopril, hydrochlorothiazide and metoprolol. Continue current meds at current dosing

## 2015-09-04 ENCOUNTER — Encounter (HOSPITAL_COMMUNITY): Payer: Self-pay

## 2015-09-04 ENCOUNTER — Encounter (HOSPITAL_COMMUNITY)
Admission: RE | Admit: 2015-09-04 | Discharge: 2015-09-04 | Disposition: A | Discharge status: Home / Self Care | Payer: BLUE CROSS/BLUE SHIELD | Source: Ambulatory Visit | Attending: Vascular Surgery | Admitting: Vascular Surgery

## 2015-09-04 HISTORY — DX: Personal history of urinary calculi: Z87.442

## 2015-09-04 LAB — URINALYSIS, ROUTINE W REFLEX MICROSCOPIC
Bilirubin Urine: NEGATIVE
Glucose, UA: >1000 mg/dL — AB
Hgb urine dipstick: NEGATIVE
Ketones, ur: NEGATIVE mg/dL
Leukocytes, UA: NEGATIVE
NITRITE: NEGATIVE
PROTEIN: NEGATIVE mg/dL
SPECIFIC GRAVITY, URINE: 1.039 — AB (ref 1.005–1.030)
pH: 5 (ref 5.0–8.0)

## 2015-09-04 LAB — TYPE AND SCREEN
ABO/RH(D): O NEG
Antibody Screen: NEGATIVE

## 2015-09-04 LAB — COMPREHENSIVE METABOLIC PANEL
ALBUMIN: 3.5 g/dL (ref 3.5–5.0)
ALK PHOS: 59 U/L (ref 38–126)
ALT: 17 U/L (ref 17–63)
AST: 15 U/L (ref 15–41)
Anion gap: 12 (ref 5–15)
BUN: 13 mg/dL (ref 6–20)
CALCIUM: 9.7 mg/dL (ref 8.9–10.3)
CHLORIDE: 100 mmol/L — AB (ref 101–111)
CO2: 23 mmol/L (ref 22–32)
CREATININE: 1.14 mg/dL (ref 0.61–1.24)
GFR calc Af Amer: >60 mL/min (ref >60)
GFR calc non Af Amer: >60 mL/min (ref >60)
GLUCOSE: 440 mg/dL — AB (ref 65–99)
Potassium: 4.4 mmol/L (ref 3.5–5.1)
SODIUM: 135 mmol/L (ref 135–145)
Total Bilirubin: 0.3 mg/dL (ref 0.3–1.2)
Total Protein: 6.5 g/dL (ref 6.5–8.1)

## 2015-09-04 LAB — SURGICAL PCR SCREEN
MRSA, PCR: NEGATIVE
Staphylococcus aureus: NEGATIVE

## 2015-09-04 LAB — PROTIME-INR
INR: 0.95 (ref 0.00–1.49)
Prothrombin Time: 12.9 seconds (ref 11.6–15.2)

## 2015-09-04 LAB — CBC
HCT: 38.2 % — ABNORMAL LOW (ref 39.0–52.0)
Hemoglobin: 12.6 g/dL — ABNORMAL LOW (ref 13.0–17.0)
MCH: 28.8 pg (ref 26.0–34.0)
MCHC: 33 g/dL (ref 30.0–36.0)
MCV: 87.2 fL (ref 78.0–100.0)
Platelets: 243 10*3/uL (ref 150–400)
RBC: 4.38 MIL/uL (ref 4.22–5.81)
RDW: 12.7 % (ref 11.5–15.5)
WBC: 8.6 10*3/uL (ref 4.0–10.5)

## 2015-09-04 LAB — URINE MICROSCOPIC-ADD ON
BACTERIA UA: NONE SEEN
RBC / HPF: NONE SEEN RBC/hpf (ref 0–5)

## 2015-09-04 LAB — GLUCOSE, CAPILLARY: GLUCOSE-CAPILLARY: 390 mg/dL — AB (ref 65–99)

## 2015-09-04 LAB — APTT: APTT: 27 s (ref 24–37)

## 2015-09-04 NOTE — Progress Notes (Signed)
   09/04/15 1518  OBSTRUCTIVE SLEEP APNEA  Have you ever been diagnosed with sleep apnea through a sleep study? No  Do you snore loudly (loud enough to be heard through closed doors)?  0  Do you often feel tired, fatigued, or sleepy during the daytime (such as falling asleep during driving or talking to someone)? 0  Has anyone observed you stop breathing during your sleep? 1  Do you have, or are you being treated for high blood pressure? 1  BMI more than 35 kg/m2? 0  Age > 50 (1-yes) 1  Neck circumference greater than:Male 16 inches or larger, Male 17inches or larger? 1 (70)  Male Gender (Yes=1) 1  Obstructive Sleep Apnea Score 5  Score 5 or greater  Results sent to PCP

## 2015-09-04 NOTE — Pre-Procedure Instructions (Addendum)
[name]  09/04/2015      [id number] [place], [place] - [address] [place] [address] Phone: [phone] Fax: [phone]    Your procedure is scheduled on 09/06/15  Report to [facility] Admitting at 530 A.M.  Call this number if you have problems the morning of surgery:  [phone]   Remember:  Do not eat food or drink liquids after midnight.  Take these medicines the morning of surgery with A SIP OF WATER gabapentin, metoprolol, hydrocodone if needed for pain  Aspirin per dr  [name] all herbel meds, nsaids (aleve,naproxen,advil,ibuprofen) starting TODAY including vitamns    NO metformin am of surgery            plavix per dr   How to Manage Your Diabetes Before Surgery   Why is it important to control my blood sugar before and after surgery?   Improving blood sugar levels before and after surgery helps healing and can limit problems.  A way of improving blood sugar control is eating a healthy diet by:  - Eating less sugar and carbohydrates  - Increasing activity/exercise  - Talk with your doctor about reaching your blood sugar goals  High blood sugars (greater than 180 mg/dL) can raise your risk of infections and slow down your recovery so you will need to focus on controlling your diabetes during the weeks before surgery.  Make sure that the doctor who takes care of your diabetes knows about your planned surgery including the date and location.  How do I manage my blood sugars before surgery?   Check your blood sugar at least 4 times a day, 2 days before surgery to make sure that they are not too high or low.   Check your blood sugar the morning of your surgery when you wake up and every 2               hours until you get to the Short-Stay unit.  If your blood sugar is less than 70 mg/dL, you will need to treat for low blood sugar by:  Treat a low blood sugar (less than 70 mg/dL) with 1/2 cup of clear juice  (cranberry or apple), 4 glucose tablets, OR glucose gel.  Recheck blood sugar in 15 minutes after treatment (to make sure it is greater than 70 mg/dL).  If blood sugar is not greater than 70 mg/dL on re-check, call [phone] for further instructions.   Report your blood sugar to the Short-Stay nurse when you get to Short-Stay.  References:  [facility], 2007 "How to Manage your Diabetes Before and After Surgery".  What do I do about my diabetes medications?   Do not take oral diabetes medicines (pills) the morning of surgery.(metformin)                                                                                 THE NIGHT BEFORE SURGERY, take 60 units of levimir Insulin. Dinner/bedtime dose    novolog  take reg meal dose/ no bedtime dose   THE MORNING OF SURGERY, take37 units of levimir Insulin.    novolog  No am dose except as follows:   If your CBG is greater than 220 mg/dL, you may take 1/2 of your sliding scale (correction) dose of insulin.    Do not wear jewelry, make-up or nail polish.  Do not wear lotions, powders, or perfumes.  You may wear deodorant.  Do not shave 48 hours prior to surgery.  Men may shave face and neck.  Do not bring valuables to the hospital.  [facility] is not responsible for any belongings or valuables.  Contacts, dentures or bridgework may not be worn into surgery.  Leave your suitcase in the car.  After surgery it may be brought to your room.  For patients admitted to the hospital, discharge time will be determined by your treatment team.  Patients discharged the day of surgery will not be allowed to drive home.   Name and phone number of your driver:    Special instructions:   Special Instructions: [facility] - Preparing for Surgery  Before surgery, you can play an important role.  Because skin is not sterile, your skin needs to be as free of germs as possible.  You can reduce the number of germs on you skin  by washing with CHG (chlorahexidine gluconate) soap before surgery.  CHG is an antiseptic cleaner which kills germs and bonds with the skin to continue killing germs even after washing.  Please DO NOT use if you have an allergy to CHG or antibacterial soaps.  If your skin becomes reddened/irritated stop using the CHG and inform your nurse when you arrive at Short Stay.  Do not shave (including legs and underarms) for at least 48 hours prior to the first CHG shower.  You may shave your face.  Please follow these instructions carefully:   1.  Shower with CHG Soap the night before surgery and the morning of Surgery.  2.  If you choose to wash your hair, wash your hair first as usual with your normal shampoo.  3.  After you shampoo, rinse your hair and body thoroughly to remove the Shampoo.  4.  Use CHG as you would any other liquid soap.  You can apply chg directly  to the skin and wash gently with scrungie or a clean washcloth.  5.  Apply the CHG Soap to your body ONLY FROM THE NECK DOWN.  Do not use on open wounds or open sores.  Avoid contact with your eyes ears, mouth and genitals (private parts).  Wash genitals (private parts)       with your normal soap.  6.  Wash thoroughly, paying special attention to the area where your surgery will be performed.  7.  Thoroughly rinse your body with warm water from the neck down.  8.  DO NOT shower/wash with your normal soap after using and rinsing off the CHG Soap.  9.  Pat yourself dry with a clean towel.            10.  Wear clean pajamas.            11.  Place clean sheets on your bed the night of your first shower and do not sleep with pets.  Day of Surgery  Do not apply any lotions/deodorants the morning of surgery.  Please wear clean clothes to the hospital/surgery center.  Please read over the following fact sheets that you were given. Pain Booklet, Coughing and Deep Breathing, Blood Transfusion Information, MRSA Information and Surgical Site  Infection Prevention

## 2015-09-04 NOTE — Progress Notes (Addendum)
cbg 390 at preadmit. inst pt to try to lower before surgery - chance could be canceled if too high am of surgery. req'd office notes from endocrinologist dr balan at [facility]. plavix d/ c 08/29/15.  Patient stated was taking vein from right arm--- no iv's or sticks please Pos apnea screening  No pcp

## 2015-09-05 LAB — HEMOGLOBIN A1C
Hgb A1c MFr Bld: 12.7 % — ABNORMAL HIGH (ref 4.8–5.6)
Mean Plasma Glucose: 318 mg/dL

## 2015-09-05 MED ORDER — DEXTROSE 5 % IV SOLN
1.5000 g | INTRAVENOUS | Status: AC
  Administered 2015-09-06: 1.5 g via INTRAVENOUS
  Filled 2015-09-05: qty 1.5

## 2015-09-05 MED ORDER — CHLORHEXIDINE GLUCONATE CLOTH 2 % EX PADS: 6.0000 | MEDICATED_PAD | Freq: Once | CUTANEOUS | Status: DC

## 2015-09-05 MED ORDER — SODIUM CHLORIDE 0.9 % IV SOLN: INTRAVENOUS | Status: DC

## 2015-09-05 NOTE — Progress Notes (Signed)
Anesthesia Chart Review: Patient is a 60 year old male scheduled for redo left SFA to PTA bypass on 09/06/15 by Dr. Oneida Alar.  History includes CAD/MI s/p CABG (LIMA-LAD, left RA-distal RCA, SVG-OM2) 03/24/01 (with occluded OM graft by '06 cath), former smoker, DM2 with peripheral neuropathy, dyslipidemia, HTN, PAD s/p multiple intervention (last right popliteal stent) and left CFA-PTA bypass (occluded). OSA screening score was 5.   Cardiologist is Dr. Gwenlyn Found, last visit 08/30/15. He cleared patient with low CV risk.  Endocrinologist is Dr. Chalmers Cater.   Meds include ASA, Plavix, Tricor, Neurontin, Novolog, Levemir, lisinopril-HCTZ, Toprol XL, Zocor. Plavix on hold since 08/29/15.  08/30/15 EKG: SR, first degree AVB, poor anterior r wave progression, inferolateral T wave abnormality, consider ischemia. Occasional PVC. PVC is new, but otherwise I think EKG appears stable when compared to 01/16/15 tracing.   02/07/15 Nuclear stress test:  Overall Study Impression Myocardial perfusion is normal. The study is normal. This is a low risk study. Overall left ventricular systolic function was normal. Nuclear stress EF: 52%. The left ventricular ejection fraction is mildly decreased (45-54%). There are no significant changes in comparison to the prior study.  02/07/15 Echo: Study Conclusions - Left ventricle: The cavity size was normal. Wall thickness was normal. Systolic function was normal. The estimated ejection fraction was in the range of 55% to 60%. Left ventricular diastolic function parameters were normal. - Mitral valve: There was mild regurgitation. - Left atrium: The atrium was mildly dilated.  01/15/05 Cardiac cath: FINAL IMPRESSION: 1. Occluded saphenous vein graft to the obtuse marginal. 2. Critical distal left anterior descending disease distal to the  anastomotic site. 3. Patent free radial graft. 4. Normal wall motion, ejection fraction 65%. 5. Dr. Leonia Reeves will be consulted for  further intervention. (I could not locate any other cath report in Epic. Dr. Kennon Holter notes in Epic mention history of occluded graft and older notes scanned under the Media tab mention history of two occluded grafts. He has had multiple stress tests since, last in 2016.)  02/26/15 CXR: IMPRESSION: No acute findings.  Preoperative labs noted. Non-fasting glucose 440 with A1c 12.7, consistent with mean plasma glucose of 318. I left a voice message for patient to call me, but according to his PAT RN notes, his typical fasting glucose runs around 245. I have notified [facility] RN Colletta Maryland about patient's poor glucose control. Patient was also told by his PAT RN that a significantly high fasting glucose on the day of surgery could cancel his procedure.  She reviewed with Dr. Oneida Alar who felt procedure needed to proceed as planned, even if it meant starting patient on a glucomander on arrival. Of note, patient told us that we should not access his right arm because it may be used for vein harvest.   Myra Gianotti, PA-C [facility]/Anesthesiology Phone [phone] 09/05/2015 11:48 AM

## 2015-09-05 NOTE — Anesthesia Preprocedure Evaluation (Addendum)
Anesthesia Evaluation  Patient identified by MRN, date of birth, ID band Patient awake    Reviewed: Allergy & Precautions, NPO status , Patient's Chart, lab work & pertinent test results  Airway Mallampati: II  TM Distance: >3 FB Neck ROM: Full    Dental no notable dental hx. (+) Missing, Dental Advisory Given   Pulmonary    Pulmonary exam normal breath sounds clear to auscultation       Cardiovascular hypertension, Pt. on medications + CAD, + Past MI and + Peripheral Vascular Disease  Normal cardiovascular exam Rhythm:Regular Rate:Normal     Neuro/Psych    GI/Hepatic   Endo/Other  diabetes, Poorly Controlled, Type 2  Renal/GU      Musculoskeletal   Abdominal   Peds  Hematology   Anesthesia Other Findings   Reproductive/Obstetrics                         Anesthesia Physical Anesthesia Plan  ASA: III  Anesthesia Plan: General   Post-op Pain Management:    Induction: Intravenous  Airway Management Planned: Oral ETT  Additional Equipment: Arterial line  Intra-op Plan:   Post-operative Plan: Extubation in OR  Informed Consent: I have reviewed the patients History and Physical, chart, labs and discussed the procedure including the risks, benefits and alternatives for the proposed anesthesia with the patient or authorized representative who has indicated his/her understanding and acceptance.   Dental advisory given  Plan Discussed with: CRNA  Anesthesia Plan Comments: (Aline, 2x piv)      Anesthesia Quick Evaluation                                  Anesthesia Evaluation  Patient identified by MRN, date of birth, ID band Patient awake    Reviewed: Allergy & Precautions, H&P , NPO status , Patient's Chart, lab work & pertinent test results  Airway Mallampati: III TM Distance: <3 FB Neck ROM: Full    Dental No notable dental hx.    Pulmonary neg pulmonary ROS,   breath sounds clear to auscultation  + decreased breath sounds      Cardiovascular hypertension, + CAD, + Past MI, + CABG and + Peripheral Vascular Disease Rhythm:Regular Rate:Normal     Neuro/Psych negative neurological ROS  negative psych ROS   GI/Hepatic negative GI ROS, Neg liver ROS,   Endo/Other  diabetes, Type 1, Insulin Dependent  Renal/GU negative Renal ROS  negative genitourinary   Musculoskeletal negative musculoskeletal ROS (+)   Abdominal   Peds negative pediatric ROS (+)  Hematology negative hematology ROS (+)   Anesthesia Other Findings   Reproductive/Obstetrics negative OB ROS                           Anesthesia Physical Anesthesia Plan  ASA: III  Anesthesia Plan: General   Post-op Pain Management:    Induction: Intravenous  Airway Management Planned: Oral ETT and Video Laryngoscope Planned  Additional Equipment:   Intra-op Plan:   Post-operative Plan: Extubation in OR  Informed Consent: I have reviewed the patients History and Physical, chart, labs and discussed the procedure including the risks, benefits and alternatives for the proposed anesthesia with the patient or authorized representative who has indicated his/her understanding and acceptance.   Dental advisory given  Plan Discussed with: CRNA and Surgeon  Anesthesia Plan Comments:  Anesthesia Quick Evaluation                                   Anesthesia Evaluation  Patient identified by MRN, date of birth, ID band Patient awake    Reviewed: Allergy & Precautions, H&P , NPO status , Patient's Chart, lab work & pertinent test results  Airway Mallampati: III TM Distance: <3 FB Neck ROM: Full    Dental no notable dental hx.    Pulmonary neg pulmonary ROS,  breath sounds clear to auscultation  + decreased breath sounds      Cardiovascular hypertension, + CAD, + Past MI, + CABG and + Peripheral Vascular Disease  Rhythm:Regular Rate:Normal     Neuro/Psych negative neurological ROS  negative psych ROS   GI/Hepatic negative GI ROS, Neg liver ROS,   Endo/Other  diabetes, Type 1, Insulin Dependent  Renal/GU negative Renal ROS  negative genitourinary   Musculoskeletal negative musculoskeletal ROS (+)   Abdominal   Peds negative pediatric ROS (+)  Hematology negative hematology ROS (+)   Anesthesia Other Findings   Reproductive/Obstetrics negative OB ROS                           Anesthesia Physical  Anesthesia Plan  ASA: III  Anesthesia Plan: General   Post-op Pain Management:    Induction: Intravenous  Airway Management Planned: Oral ETT and Video Laryngoscope Planned  Additional Equipment:   Intra-op Plan:   Post-operative Plan: Extubation in OR  Informed Consent: I have reviewed the patients History and Physical, chart, labs and discussed the procedure including the risks, benefits and alternatives for the proposed anesthesia with the patient or authorized representative who has indicated his/her understanding and acceptance.   Dental advisory given  Plan Discussed with: CRNA and Surgeon  Anesthesia Plan Comments: (SCB)        Anesthesia Quick Evaluation

## 2015-09-06 ENCOUNTER — Encounter (HOSPITAL_COMMUNITY)
Admission: RE | Disposition: A | Discharge status: Home / Self Care | Payer: Self-pay | Source: Ambulatory Visit | Attending: Vascular Surgery

## 2015-09-06 ENCOUNTER — Inpatient Hospital Stay (HOSPITAL_COMMUNITY)
Admission: RE | Admit: 2015-09-06 | Discharge: 2015-09-09 | DRG: 253 | Disposition: A | Discharge status: Home / Self Care | Payer: BLUE CROSS/BLUE SHIELD | Source: Ambulatory Visit | Attending: Vascular Surgery | Admitting: Vascular Surgery

## 2015-09-06 ENCOUNTER — Inpatient Hospital Stay (HOSPITAL_COMMUNITY): Payer: BLUE CROSS/BLUE SHIELD | Admitting: Certified Registered Nurse Anesthetist

## 2015-09-06 ENCOUNTER — Encounter (HOSPITAL_COMMUNITY): Payer: Self-pay | Admitting: *Deleted

## 2015-09-06 ENCOUNTER — Inpatient Hospital Stay (HOSPITAL_COMMUNITY): Payer: BLUE CROSS/BLUE SHIELD | Admitting: Vascular Surgery

## 2015-09-06 DIAGNOSIS — I739 Peripheral vascular disease, unspecified: Secondary | ICD-10-CM | POA: Diagnosis present

## 2015-09-06 DIAGNOSIS — I10 Essential (primary) hypertension: Secondary | ICD-10-CM | POA: Diagnosis present

## 2015-09-06 DIAGNOSIS — Z85828 Personal history of other malignant neoplasm of skin: Secondary | ICD-10-CM

## 2015-09-06 DIAGNOSIS — Z794 Long term (current) use of insulin: Secondary | ICD-10-CM | POA: Diagnosis not present

## 2015-09-06 DIAGNOSIS — Z87891 Personal history of nicotine dependence: Secondary | ICD-10-CM

## 2015-09-06 DIAGNOSIS — I252 Old myocardial infarction: Secondary | ICD-10-CM | POA: Diagnosis not present

## 2015-09-06 DIAGNOSIS — Z86718 Personal history of other venous thrombosis and embolism: Secondary | ICD-10-CM | POA: Diagnosis not present

## 2015-09-06 DIAGNOSIS — I70222 Atherosclerosis of native arteries of extremities with rest pain, left leg: Secondary | ICD-10-CM | POA: Diagnosis not present

## 2015-09-06 DIAGNOSIS — E1151 Type 2 diabetes mellitus with diabetic peripheral angiopathy without gangrene: Principal | ICD-10-CM | POA: Diagnosis present

## 2015-09-06 DIAGNOSIS — E785 Hyperlipidemia, unspecified: Secondary | ICD-10-CM | POA: Diagnosis present

## 2015-09-06 DIAGNOSIS — Z79899 Other long term (current) drug therapy: Secondary | ICD-10-CM | POA: Diagnosis not present

## 2015-09-06 DIAGNOSIS — E1142 Type 2 diabetes mellitus with diabetic polyneuropathy: Secondary | ICD-10-CM | POA: Diagnosis present

## 2015-09-06 DIAGNOSIS — Y832 Surgical operation with anastomosis, bypass or graft as the cause of abnormal reaction of the patient, or of later complication, without mention of misadventure at the time of the procedure: Secondary | ICD-10-CM | POA: Diagnosis not present

## 2015-09-06 DIAGNOSIS — Z7902 Long term (current) use of antithrombotics/antiplatelets: Secondary | ICD-10-CM | POA: Diagnosis not present

## 2015-09-06 DIAGNOSIS — Z95820 Peripheral vascular angioplasty status with implants and grafts: Secondary | ICD-10-CM

## 2015-09-06 DIAGNOSIS — L7632 Postprocedural hematoma of skin and subcutaneous tissue following other procedure: Secondary | ICD-10-CM | POA: Diagnosis not present

## 2015-09-06 DIAGNOSIS — I25810 Atherosclerosis of coronary artery bypass graft(s) without angina pectoris: Secondary | ICD-10-CM | POA: Diagnosis present

## 2015-09-06 DIAGNOSIS — Y92239 Unspecified place in hospital as the place of occurrence of the external cause: Secondary | ICD-10-CM | POA: Diagnosis not present

## 2015-09-06 DIAGNOSIS — I2510 Atherosclerotic heart disease of native coronary artery without angina pectoris: Secondary | ICD-10-CM | POA: Diagnosis present

## 2015-09-06 DIAGNOSIS — Z9889 Other specified postprocedural states: Secondary | ICD-10-CM | POA: Diagnosis not present

## 2015-09-06 HISTORY — PX: VEIN HARVEST: SHX6363

## 2015-09-06 HISTORY — PX: FEMORAL-TIBIAL BYPASS GRAFT: SHX938

## 2015-09-06 LAB — GLUCOSE, CAPILLARY
GLUCOSE-CAPILLARY: 242 mg/dL — AB (ref 65–99)
Glucose-Capillary: 229 mg/dL — ABNORMAL HIGH (ref 65–99)
Glucose-Capillary: 171 mg/dL — ABNORMAL HIGH (ref 65–99)

## 2015-09-06 LAB — MRSA PCR SCREENING: MRSA by PCR: NEGATIVE

## 2015-09-06 SURGERY — CREATION, BYPASS, ARTERIAL, FEMORAL TO TIBIAL, USING GRAFT
Anesthesia: General | Site: Leg Upper | Laterality: Right

## 2015-09-06 MED ORDER — STERILE WATER FOR INJECTION IJ SOLN
INTRAMUSCULAR | Status: AC
  Filled 2015-09-06: qty 10

## 2015-09-06 MED ORDER — LACTATED RINGERS IV SOLN
INTRAVENOUS | Status: DC | PRN
  Administered 2015-09-06 (×4): via INTRAVENOUS

## 2015-09-06 MED ORDER — HYDROMORPHONE HCL 1 MG/ML IJ SOLN
0.2500 mg | INTRAMUSCULAR | Status: DC | PRN
  Administered 2015-09-06 (×2): 0.5 mg via INTRAVENOUS

## 2015-09-06 MED ORDER — MIDAZOLAM HCL 5 MG/5ML IJ SOLN
INTRAMUSCULAR | Status: DC | PRN
  Administered 2015-09-06 (×2): 2 mg via INTRAVENOUS

## 2015-09-06 MED ORDER — ALBUMIN HUMAN 5 % IV SOLN
INTRAVENOUS | Status: DC | PRN
  Administered 2015-09-06: 12:00:00 via INTRAVENOUS

## 2015-09-06 MED ORDER — GUAIFENESIN-DM 100-10 MG/5ML PO SYRP: 15.0000 mL | ORAL_SOLUTION | ORAL | Status: DC | PRN

## 2015-09-06 MED ORDER — DEXTROSE 5 % IV SOLN
1.5000 g | Freq: Two times a day (BID) | INTRAVENOUS | Status: AC
  Administered 2015-09-06 – 2015-09-07 (×2): 1.5 g via INTRAVENOUS
  Filled 2015-09-06 (×3): qty 1.5

## 2015-09-06 MED ORDER — ACETAMINOPHEN 325 MG RE SUPP
325.0000 mg | RECTAL | Status: DC | PRN
  Filled 2015-09-06: qty 2

## 2015-09-06 MED ORDER — DEXTROSE 5 % IV SOLN
1.5000 g | INTRAVENOUS | Status: AC
  Administered 2015-09-06: 1.5 g via INTRAVENOUS
  Filled 2015-09-06: qty 1.5

## 2015-09-06 MED ORDER — GABAPENTIN 300 MG PO CAPS
300.0000 mg | ORAL_CAPSULE | Freq: Three times a day (TID) | ORAL | Status: DC
  Administered 2015-09-06 – 2015-09-08 (×7): 300 mg via ORAL
  Filled 2015-09-06 (×7): qty 1

## 2015-09-06 MED ORDER — OXYCODONE-ACETAMINOPHEN 5-325 MG PO TABS
1.0000 | ORAL_TABLET | ORAL | Status: DC | PRN
  Administered 2015-09-06 – 2015-09-07 (×2): 2 via ORAL
  Administered 2015-09-07: 1 via ORAL
  Administered 2015-09-07 – 2015-09-09 (×6): 2 via ORAL
  Filled 2015-09-06 (×5): qty 2
  Filled 2015-09-06: qty 1
  Filled 2015-09-06 (×3): qty 2

## 2015-09-06 MED ORDER — ROCURONIUM BROMIDE 100 MG/10ML IV SOLN
INTRAVENOUS | Status: DC | PRN
  Administered 2015-09-06 (×3): 50 mg via INTRAVENOUS
  Administered 2015-09-06: 20 mg via INTRAVENOUS

## 2015-09-06 MED ORDER — EPHEDRINE SULFATE 50 MG/ML IJ SOLN
INTRAMUSCULAR | Status: AC
  Filled 2015-09-06: qty 1

## 2015-09-06 MED ORDER — SUCCINYLCHOLINE CHLORIDE 20 MG/ML IJ SOLN
INTRAMUSCULAR | Status: AC
  Filled 2015-09-06: qty 1

## 2015-09-06 MED ORDER — PHENYLEPHRINE HCL 10 MG/ML IJ SOLN
10.0000 mg | INTRAMUSCULAR | Status: DC | PRN
  Administered 2015-09-06: 12:00:00 via INTRAVENOUS
  Administered 2015-09-06: 40 ug/min via INTRAVENOUS

## 2015-09-06 MED ORDER — FENTANYL CITRATE (PF) 250 MCG/5ML IJ SOLN
INTRAMUSCULAR | Status: AC
  Filled 2015-09-06: qty 5

## 2015-09-06 MED ORDER — PROPOFOL 10 MG/ML IV BOLUS
INTRAVENOUS | Status: AC
  Filled 2015-09-06: qty 20

## 2015-09-06 MED ORDER — FENTANYL CITRATE (PF) 250 MCG/5ML IJ SOLN
INTRAMUSCULAR | Status: DC | PRN
  Administered 2015-09-06 (×2): 50 ug via INTRAVENOUS
  Administered 2015-09-06: 150 ug via INTRAVENOUS
  Administered 2015-09-06 (×2): 50 ug via INTRAVENOUS
  Administered 2015-09-06: 100 ug via INTRAVENOUS
  Administered 2015-09-06 (×2): 50 ug via INTRAVENOUS
  Administered 2015-09-06: 100 ug via INTRAVENOUS
  Administered 2015-09-06: 50 ug via INTRAVENOUS

## 2015-09-06 MED ORDER — ATORVASTATIN CALCIUM 40 MG PO TABS
40.0000 mg | ORAL_TABLET | Freq: Every day | ORAL | Status: DC
  Administered 2015-09-07 – 2015-09-08 (×2): 40 mg via ORAL
  Filled 2015-09-06 (×2): qty 1

## 2015-09-06 MED ORDER — INSULIN DETEMIR 100 UNIT/ML ~~LOC~~ SOLN
75.0000 [IU] | Freq: Two times a day (BID) | SUBCUTANEOUS | Status: DC
  Administered 2015-09-06 – 2015-09-08 (×5): 75 [IU] via SUBCUTANEOUS
  Filled 2015-09-06 (×10): qty 0.75

## 2015-09-06 MED ORDER — MIDAZOLAM HCL 2 MG/2ML IJ SOLN
INTRAMUSCULAR | Status: AC
  Filled 2015-09-06: qty 2

## 2015-09-06 MED ORDER — ENOXAPARIN SODIUM 40 MG/0.4ML ~~LOC~~ SOLN
40.0000 mg | SUBCUTANEOUS | Status: DC
  Administered 2015-09-07 – 2015-09-09 (×3): 40 mg via SUBCUTANEOUS
  Filled 2015-09-06 (×3): qty 0.4

## 2015-09-06 MED ORDER — MAGNESIUM SULFATE 2 GM/50ML IV SOLN
2.0000 g | Freq: Every day | INTRAVENOUS | Status: DC | PRN
  Filled 2015-09-06: qty 50

## 2015-09-06 MED ORDER — HEPARIN SODIUM (PORCINE) 1000 UNIT/ML IJ SOLN
INTRAMUSCULAR | Status: AC
  Filled 2015-09-06: qty 1

## 2015-09-06 MED ORDER — LIDOCAINE HCL (CARDIAC) 20 MG/ML IV SOLN
INTRAVENOUS | Status: AC
  Filled 2015-09-06: qty 5

## 2015-09-06 MED ORDER — METOPROLOL SUCCINATE ER 25 MG PO TB24
25.0000 mg | ORAL_TABLET | Freq: Every day | ORAL | Status: DC
  Administered 2015-09-06 – 2015-09-08 (×3): 25 mg via ORAL
  Filled 2015-09-06 (×4): qty 1

## 2015-09-06 MED ORDER — LISINOPRIL-HYDROCHLOROTHIAZIDE 20-12.5 MG PO TABS: 1.0000 | ORAL_TABLET | Freq: Every day | ORAL | Status: DC

## 2015-09-06 MED ORDER — HYDROMORPHONE HCL 1 MG/ML IJ SOLN
INTRAMUSCULAR | Status: AC
Start: 2015-09-06 — End: 2015-09-06
  Administered 2015-09-06: 0.5 mg via INTRAVENOUS
  Filled 2015-09-06: qty 1

## 2015-09-06 MED ORDER — ONDANSETRON HCL 4 MG/2ML IJ SOLN: 4.0000 mg | Freq: Four times a day (QID) | INTRAMUSCULAR | Status: DC | PRN

## 2015-09-06 MED ORDER — SUGAMMADEX SODIUM 500 MG/5ML IV SOLN
INTRAVENOUS | Status: AC
  Filled 2015-09-06: qty 5

## 2015-09-06 MED ORDER — FENOFIBRATE 54 MG PO TABS
54.0000 mg | ORAL_TABLET | Freq: Every day | ORAL | Status: DC
  Administered 2015-09-06 – 2015-09-08 (×3): 54 mg via ORAL
  Filled 2015-09-06 (×6): qty 1

## 2015-09-06 MED ORDER — 0.9 % SODIUM CHLORIDE (POUR BTL) OPTIME
TOPICAL | Status: DC | PRN
  Administered 2015-09-06 (×2): 1000 mL

## 2015-09-06 MED ORDER — FLEET ENEMA 7-19 GM/118ML RE ENEM: 1.0000 | ENEMA | Freq: Once | RECTAL | Status: DC | PRN

## 2015-09-06 MED ORDER — METOCLOPRAMIDE HCL 5 MG/ML IJ SOLN
INTRAMUSCULAR | Status: AC
  Filled 2015-09-06: qty 2

## 2015-09-06 MED ORDER — DOCUSATE SODIUM 100 MG PO CAPS
100.0000 mg | ORAL_CAPSULE | Freq: Every day | ORAL | Status: DC
  Administered 2015-09-07 – 2015-09-08 (×2): 100 mg via ORAL
  Filled 2015-09-06 (×2): qty 1

## 2015-09-06 MED ORDER — ONDANSETRON HCL 4 MG/2ML IJ SOLN
INTRAMUSCULAR | Status: DC | PRN
  Administered 2015-09-06: 4 mg via INTRAVENOUS

## 2015-09-06 MED ORDER — METFORMIN HCL ER 500 MG PO TB24
500.0000 mg | ORAL_TABLET | Freq: Two times a day (BID) | ORAL | Status: DC
  Administered 2015-09-06 – 2015-09-08 (×5): 500 mg via ORAL
  Filled 2015-09-06 (×5): qty 1

## 2015-09-06 MED ORDER — THROMBIN 20000 UNITS EX SOLR
CUTANEOUS | Status: DC | PRN
  Administered 2015-09-06: 20 mL via TOPICAL

## 2015-09-06 MED ORDER — PAPAVERINE HCL 30 MG/ML IJ SOLN
INTRAMUSCULAR | Status: AC
  Filled 2015-09-06: qty 2

## 2015-09-06 MED ORDER — POTASSIUM CHLORIDE CRYS ER 20 MEQ PO TBCR: 20.0000 meq | EXTENDED_RELEASE_TABLET | Freq: Every day | ORAL | Status: DC | PRN

## 2015-09-06 MED ORDER — PHENOL 1.4 % MT LIQD: 1.0000 | OROMUCOSAL | Status: DC | PRN

## 2015-09-06 MED ORDER — CLOPIDOGREL BISULFATE 75 MG PO TABS
75.0000 mg | ORAL_TABLET | Freq: Every day | ORAL | Status: DC
  Administered 2015-09-07 – 2015-09-08 (×2): 75 mg via ORAL
  Filled 2015-09-06 (×2): qty 1

## 2015-09-06 MED ORDER — THROMBIN 20000 UNITS EX SOLR
CUTANEOUS | Status: AC
  Filled 2015-09-06: qty 20000

## 2015-09-06 MED ORDER — ASPIRIN 325 MG PO TABS
325.0000 mg | ORAL_TABLET | Freq: Every day | ORAL | Status: DC
  Administered 2015-09-07 – 2015-09-09 (×3): 325 mg via ORAL
  Filled 2015-09-06 (×3): qty 1

## 2015-09-06 MED ORDER — METOCLOPRAMIDE HCL 5 MG/ML IJ SOLN
INTRAMUSCULAR | Status: DC | PRN
  Administered 2015-09-06 (×2): 5 mg via INTRAVENOUS

## 2015-09-06 MED ORDER — PANTOPRAZOLE SODIUM 40 MG PO TBEC
40.0000 mg | DELAYED_RELEASE_TABLET | Freq: Every day | ORAL | Status: DC
  Administered 2015-09-07 – 2015-09-08 (×2): 40 mg via ORAL
  Filled 2015-09-06 (×2): qty 1

## 2015-09-06 MED ORDER — SODIUM CHLORIDE 0.9 % IV SOLN
INTRAVENOUS | Status: DC
  Administered 2015-09-06 – 2015-09-08 (×2): via INTRAVENOUS

## 2015-09-06 MED ORDER — HEPARIN SODIUM (PORCINE) 1000 UNIT/ML IJ SOLN
INTRAMUSCULAR | Status: DC | PRN
  Administered 2015-09-06: 10 mL via INTRAVENOUS

## 2015-09-06 MED ORDER — ROCURONIUM BROMIDE 50 MG/5ML IV SOLN
INTRAVENOUS | Status: AC
  Filled 2015-09-06: qty 1

## 2015-09-06 MED ORDER — METOPROLOL TARTRATE 1 MG/ML IV SOLN: 2.0000 mg | INTRAVENOUS | Status: DC | PRN

## 2015-09-06 MED ORDER — BISACODYL 5 MG PO TBEC: 5.0000 mg | DELAYED_RELEASE_TABLET | Freq: Every day | ORAL | Status: DC | PRN

## 2015-09-06 MED ORDER — ROCURONIUM BROMIDE 50 MG/5ML IV SOLN
INTRAVENOUS | Status: AC
  Filled 2015-09-06: qty 2

## 2015-09-06 MED ORDER — INSULIN ASPART 100 UNIT/ML ~~LOC~~ SOLN
35.0000 [IU] | Freq: Three times a day (TID) | SUBCUTANEOUS | Status: DC
  Administered 2015-09-07 – 2015-09-09 (×7): 35 [IU] via SUBCUTANEOUS

## 2015-09-06 MED ORDER — SUCCINYLCHOLINE CHLORIDE 20 MG/ML IJ SOLN
INTRAMUSCULAR | Status: DC | PRN
  Administered 2015-09-06: 100 mg via INTRAVENOUS

## 2015-09-06 MED ORDER — PROMETHAZINE HCL 25 MG/ML IJ SOLN: 6.2500 mg | INTRAMUSCULAR | Status: DC | PRN

## 2015-09-06 MED ORDER — HYDRALAZINE HCL 20 MG/ML IJ SOLN: 5.0000 mg | INTRAMUSCULAR | Status: DC | PRN

## 2015-09-06 MED ORDER — PHENYLEPHRINE HCL 10 MG/ML IJ SOLN
INTRAMUSCULAR | Status: AC
  Filled 2015-09-06: qty 1

## 2015-09-06 MED ORDER — LIDOCAINE HCL (CARDIAC) 20 MG/ML IV SOLN
INTRAVENOUS | Status: DC | PRN
  Administered 2015-09-06: 100 mg via INTRAVENOUS

## 2015-09-06 MED ORDER — SUGAMMADEX SODIUM 200 MG/2ML IV SOLN
INTRAVENOUS | Status: DC | PRN
  Administered 2015-09-06: 500 mg via INTRAVENOUS

## 2015-09-06 MED ORDER — MEPERIDINE HCL 25 MG/ML IJ SOLN: 6.2500 mg | INTRAMUSCULAR | Status: DC | PRN

## 2015-09-06 MED ORDER — HYDROCHLOROTHIAZIDE 12.5 MG PO CAPS
12.5000 mg | ORAL_CAPSULE | Freq: Every day | ORAL | Status: DC
  Administered 2015-09-07 – 2015-09-08 (×2): 12.5 mg via ORAL
  Filled 2015-09-06 (×2): qty 1

## 2015-09-06 MED ORDER — INSULIN ASPART 100 UNIT/ML ~~LOC~~ SOLN
0.0000 [IU] | Freq: Three times a day (TID) | SUBCUTANEOUS | Status: DC
  Administered 2015-09-07: 8 [IU] via SUBCUTANEOUS
  Administered 2015-09-07: 3 [IU] via SUBCUTANEOUS
  Administered 2015-09-08: 2 [IU] via SUBCUTANEOUS
  Administered 2015-09-08: 5 [IU] via SUBCUTANEOUS

## 2015-09-06 MED ORDER — ACETAMINOPHEN 325 MG PO TABS: 325.0000 mg | ORAL_TABLET | ORAL | Status: DC | PRN

## 2015-09-06 MED ORDER — SODIUM CHLORIDE 0.9 % IV SOLN
INTRAVENOUS | Status: DC | PRN
  Administered 2015-09-06: 500 mL

## 2015-09-06 MED ORDER — LABETALOL HCL 5 MG/ML IV SOLN: 10.0000 mg | INTRAVENOUS | Status: DC | PRN

## 2015-09-06 MED ORDER — PROPOFOL 10 MG/ML IV BOLUS
INTRAVENOUS | Status: DC | PRN
  Administered 2015-09-06: 200 mg via INTRAVENOUS

## 2015-09-06 MED ORDER — SENNOSIDES-DOCUSATE SODIUM 8.6-50 MG PO TABS: 1.0000 | ORAL_TABLET | Freq: Every evening | ORAL | Status: DC | PRN

## 2015-09-06 MED ORDER — LISINOPRIL 10 MG PO TABS
20.0000 mg | ORAL_TABLET | Freq: Every day | ORAL | Status: DC
  Administered 2015-09-07 – 2015-09-08 (×2): 20 mg via ORAL
  Filled 2015-09-06: qty 2
  Filled 2015-09-06: qty 1

## 2015-09-06 MED ORDER — ALUM & MAG HYDROXIDE-SIMETH 200-200-20 MG/5ML PO SUSP: 15.0000 mL | ORAL | Status: DC | PRN

## 2015-09-06 MED ORDER — SODIUM CHLORIDE 0.9 % IV SOLN: 500.0000 mL | Freq: Once | INTRAVENOUS | Status: DC | PRN

## 2015-09-06 SURGICAL SUPPLY — 73 items
BAG DECANTER FOR FLEXI CONT (MISCELLANEOUS) ×3 IMPLANT
BAG ISOLATION DRAPE 18X18 (DRAPES) IMPLANT
BANDAGE ACE 6X5 VEL STRL LF (GAUZE/BANDAGES/DRESSINGS) ×3 IMPLANT
BANDAGE ESMARK 6X9 LF (GAUZE/BANDAGES/DRESSINGS) IMPLANT
BIOPATCH RED 1 DISK 7.0 (GAUZE/BANDAGES/DRESSINGS) ×3 IMPLANT
BNDG ESMARK 6X9 LF (GAUZE/BANDAGES/DRESSINGS)
CANISTER SUCTION 2500CC (MISCELLANEOUS) ×3 IMPLANT
CANNULA VESSEL 3MM 2 BLNT TIP (CANNULA) ×6 IMPLANT
CLIP TI MEDIUM 24 (CLIP) ×3 IMPLANT
CLIP TI WIDE RED SMALL 24 (CLIP) ×6 IMPLANT
CUFF TOURNIQUET SINGLE 24IN (TOURNIQUET CUFF) IMPLANT
CUFF TOURNIQUET SINGLE 34IN LL (TOURNIQUET CUFF) IMPLANT
CUFF TOURNIQUET SINGLE 44IN (TOURNIQUET CUFF) IMPLANT
DRAIN SNY WOU (WOUND CARE) IMPLANT
DRAPE ISOLATION BAG 18X18 (DRAPES)
DRAPE ORTHO SPLIT 77X108 STRL (DRAPES) ×2
DRAPE PROXIMA HALF (DRAPES) IMPLANT
DRAPE SURG ORHT 6 SPLT 77X108 (DRAPES) ×4 IMPLANT
DRAPE U-SHAPE 47X51 STRL (DRAPES) ×3 IMPLANT
DRAPE X-RAY CASS 24X20 (DRAPES) IMPLANT
DRSG COVADERM 4X8 (GAUZE/BANDAGES/DRESSINGS) ×9 IMPLANT
ELECT CAUTERY BLADE 6.4 (BLADE) ×6 IMPLANT
ELECT REM PT RETURN 9FT ADLT (ELECTROSURGICAL) ×3
ELECTRODE REM PT RTRN 9FT ADLT (ELECTROSURGICAL) ×2 IMPLANT
EVACUATOR SILICONE 100CC (DRAIN) IMPLANT
GAUZE SPONGE 4X4 16PLY XRAY LF (GAUZE/BANDAGES/DRESSINGS) ×3 IMPLANT
GLOVE BIO SURGEON STRL SZ 6 (GLOVE) ×3 IMPLANT
GLOVE BIO SURGEON STRL SZ 6.5 (GLOVE) ×6 IMPLANT
GLOVE BIO SURGEON STRL SZ7.5 (GLOVE) ×6 IMPLANT
GLOVE BIOGEL PI IND STRL 6.5 (GLOVE) ×10 IMPLANT
GLOVE BIOGEL PI IND STRL 7.0 (GLOVE) ×4 IMPLANT
GLOVE BIOGEL PI IND STRL 7.5 (GLOVE) ×6 IMPLANT
GLOVE BIOGEL PI INDICATOR 6.5 (GLOVE) ×5
GLOVE BIOGEL PI INDICATOR 7.0 (GLOVE) ×2
GLOVE BIOGEL PI INDICATOR 7.5 (GLOVE) ×3
GLOVE ECLIPSE 6.5 STRL STRAW (GLOVE) ×12 IMPLANT
GLOVE SURG SS PI 7.5 STRL IVOR (GLOVE) ×9 IMPLANT
GOWN STRL REUS W/ TWL LRG LVL3 (GOWN DISPOSABLE) ×14 IMPLANT
GOWN STRL REUS W/ TWL XL LVL3 (GOWN DISPOSABLE) ×8 IMPLANT
GOWN STRL REUS W/TWL LRG LVL3 (GOWN DISPOSABLE) ×7
GOWN STRL REUS W/TWL XL LVL3 (GOWN DISPOSABLE) ×4
GRAFT PROPATEN THIN WALL 6X40 (Vascular Products) ×3 IMPLANT
KIT BASIN OR (CUSTOM PROCEDURE TRAY) ×3 IMPLANT
KIT ROOM TURNOVER OR (KITS) ×3 IMPLANT
LIQUID BAND (GAUZE/BANDAGES/DRESSINGS) ×3 IMPLANT
NS IRRIG 1000ML POUR BTL (IV SOLUTION) ×6 IMPLANT
PACK PERIPHERAL VASCULAR (CUSTOM PROCEDURE TRAY) ×3 IMPLANT
PAD ARMBOARD 7.5X6 YLW CONV (MISCELLANEOUS) ×6 IMPLANT
PADDING CAST COTTON 6X4 STRL (CAST SUPPLIES) IMPLANT
PENCIL BUTTON HOLSTER BLD 10FT (ELECTRODE) ×6 IMPLANT
SET COLLECT BLD 21X3/4 12 (NEEDLE) IMPLANT
SPONGE LAP 18X18 X RAY DECT (DISPOSABLE) ×3 IMPLANT
SPONGE SURGIFOAM ABS GEL 100 (HEMOSTASIS) ×3 IMPLANT
STAPLER VISISTAT 35W (STAPLE) ×3 IMPLANT
STOPCOCK 4 WAY LG BORE MALE ST (IV SETS) IMPLANT
SUT PROLENE 5 0 C 1 24 (SUTURE) ×3 IMPLANT
SUT PROLENE 6 0 CC (SUTURE) ×9 IMPLANT
SUT PROLENE 7 0 BV 1 (SUTURE) ×15 IMPLANT
SUT PROLENE 7 0 BV1 MDA (SUTURE) IMPLANT
SUT SILK 2 0 FS (SUTURE) ×3 IMPLANT
SUT SILK 2 0 SH (SUTURE) ×3 IMPLANT
SUT SILK 3 0 (SUTURE) ×3
SUT SILK 3-0 18XBRD TIE 12 (SUTURE) ×6 IMPLANT
SUT VIC AB 2-0 SH 27 (SUTURE) ×2
SUT VIC AB 2-0 SH 27XBRD (SUTURE) ×4 IMPLANT
SUT VIC AB 3-0 SH 27 (SUTURE) ×7
SUT VIC AB 3-0 SH 27X BRD (SUTURE) ×14 IMPLANT
SUT VICRYL 4-0 PS2 18IN ABS (SUTURE) ×18 IMPLANT
TAPE UMBILICAL COTTON 1/8X30 (MISCELLANEOUS) ×3 IMPLANT
TRAY FOLEY W/METER SILVER 16FR (SET/KITS/TRAYS/PACK) ×3 IMPLANT
TUBING EXTENTION W/L.L. (IV SETS) IMPLANT
UNDERPAD 30X30 INCONTINENT (UNDERPADS AND DIAPERS) ×3 IMPLANT
WATER STERILE IRR 1000ML POUR (IV SOLUTION) ×3 IMPLANT

## 2015-09-06 NOTE — Transfer of Care (Signed)
Immediate Anesthesia Transfer of Care Note  Patient: [name]  Procedure(s) Performed: Procedure(s): LEFT FEMORAL-POSTERIOR TIBIAL ARTERY BYPASS GRAFT WITH COMPOSITE PTFE AND RIGHT ARM VEIN (Left) RIGHT ARM VEIN HARVEST (Right)  Patient Location: PACU  Anesthesia Type:General  Level of Consciousness: awake and alert   Airway & Oxygen Therapy: Patient Spontanous Breathing and Patient connected to nasal cannula oxygen  Post-op Assessment: Report given to RN, Post -op Vital signs reviewed and stable and Patient moving all extremities X 4  Post vital signs: Reviewed and stable  Last Vitals:  Filed Vitals:   09/06/15 0606 09/06/15 1444  BP: 124/67   Pulse: 79   Temp: 37.4 C 36.9 C  Resp: 16     Complications: No apparent anesthesia complications

## 2015-09-06 NOTE — Anesthesia Procedure Notes (Signed)
Procedure Name: Intubation Date/Time: 09/06/2015 7:54 AM Performed by: Ollen Bowl Pre-anesthesia Checklist: Emergency Drugs available, Patient identified, Suction available, Patient being monitored and Timeout performed Patient Re-evaluated:Patient Re-evaluated prior to inductionOxygen Delivery Method: Circle system utilized and Simple face mask Preoxygenation: Pre-oxygenation with 100% oxygen Intubation Type: IV induction Ventilation: Mask ventilation without difficulty Laryngoscope Size: Miller and 3 Grade View: Grade I Tube type: Oral Tube size: 7.5 mm Number of attempts: 1 Airway Equipment and Method: Patient positioned with wedge pillow and Stylet Placement Confirmation: ETT inserted through vocal cords under direct vision,  positive ETCO2 and breath sounds checked- equal and bilateral Secured at: 22 cm Tube secured with: Tape Dental Injury: Teeth and Oropharynx as per pre-operative assessment

## 2015-09-06 NOTE — Progress Notes (Signed)
  Day of Surgery Note    Subjective:  No complaints  Filed Vitals:   09/06/15 1444 09/06/15 1445  BP:  146/78  Pulse:  93  Temp: 98.4 F (36.9 C)   Resp:  17    Incisions:   Bandages are clean and dry Extremities:  + doppler signal left DP/PT Cardiac:  regular Lungs:  Non labored   Assessment/Plan:  This is a 60 y.o. male who is s/p Left superficial femoral to posterior tibial artery bypass using non-reversed right upper extremity vein composite PTFE  -there is +doppler signal left DP/PT -right arm bandaged -to [facility] when bed available   Leontine Locket, PA-C 09/06/2015 3:43 PM

## 2015-09-06 NOTE — Interval H&P Note (Signed)
History and Physical Interval Note:  09/06/2015 7:11 AM  [name]  has presented today for surgery, with the diagnosis of Peripheral vascular disease with left lower extremity rest pain I70.222  The various methods of treatment have been discussed with the patient and family. After consideration of risks, benefits and other options for treatment, the patient has consented to  Procedure(s): BYPASS GRAFT FEMORAL-POSTERIOR TIBIAL ARTERY (Left) as a surgical intervention .  The patient's history has been reviewed, patient examined, no change in status, stable for surgery.  I have reviewed the patient's chart and labs.  Questions were answered to the patient's satisfaction.     Ruta Hinds

## 2015-09-06 NOTE — Op Note (Signed)
Procedure: Left superficial femoral to posterior tibial artery bypass using non-reversed right upper extremity vein composite PTFE  Preoperative diagnosis: Rest pain with nonhealing wound left foot  Postoperative diagnosis: Same  Anesthesia: Gen.  Assistant: Silva Bandy PA-C, Gerri Lins PA-C  Operative findings: #16 mm PTFE extending from the distal left superficial femoral artery to just above the knee joint non-reversed right upper extremity arm vein extending from the knee joint to the distal posterior tibial artery  Operative details: After obtaining informed consent, the patient was taken to the operating room. The patient was placed in supine position operating table. After induction of general anesthesia and endotracheal intubation, the patient's entire right upper extremity and left lower extremities were prepped and draped in usual sterile fashion. Next a longitudinal incision was made in the right arm at the level of the wrist over the cephalic vein. Cephalic vein was fairly small at the wrist level. This proceeded to be of much better quality vein as I proceeded of the arm. The cephalic vein was dissected free circumferentially and small side branches ligated and divided between silk ties. Several skip incisions were placed in the forearm. At the level of the antecubital fossa, the cephalic vein became smaller and crossing the antecubital area the basilic vein seemed to be of better quality so I followed this up to the upper arm. The basilic vein and then ended in the mid upper arm so I harvested extending into the deep brachial vein all the way up to the level of the axilla. It was of good quality through this section approximately 3-4 mm diameter. The vein was transected at the wrist level and ligated with 0 silk tie. The vein was then also ligated with a 2-0 silk tie up in the axilla and vein divided and passed to the heparinized saline solution.  Next I proceeded to expose the left  superficial femoral artery approximately 10 cm above the knee joint. A longitudinal incision was made in this location and the sartorius muscle reflected posteriorly and the superficial femoral artery dissected free circumferentially. It was soft and character. A small side branches were ligated and divided between silk ties to provide extra mobilization.  Next the posterior tibial artery was exposed through a medial incision on the leg approximately 10 cm above the medial malleolus. Incision was carried down through the saphenous tissues and down through the fascia. The posterior tibial artery was dissected free circumferentially. It was approximately 1-1/2 mm in diameter. A tunnel was then created by making an additional incision on the medial aspect of the leg and deepening this to the above-knee popliteal space. The tunnel was then created connecting the posterior tibial incision to the above-knee popliteal space and then up to the subsartorial space and the thigh at the superficial femoral artery. Patient was given 10,000 units of intravenous heparin. Superficial femoral artery was inspected and did not feel I could go any more distally because the artery became more diseased. I noticed that the punctate the vein was not going to reach all the way to the posterior tibial artery. Therefore a piece of 6 mm propaten was brought in the operative field and this was spatulated as well as the vein and these were sewn end in together using a running 7-0 Prolene suture. The vein was placed in a non-reversed configuration. At this point the graft was cut to length and the superficial femoral artery. Proximal and distally with Henley clamps. A longitudinal opening was made in the superficial  femoral artery graft was beveled and sewn end of graft to side of artery using a running 6-0 Prolene suture. Just prior completion of the anastomosis it was forebled backbled and thoroughly flushed. Anastomosis was completed and  this was packed with thrombin and Gelfoam. The vein portion of the graft was then marked for orientation and brought through the tunnel down to the posterior tibial artery. The posterior tibial artery was controlled proximally and distally with fine bulldog clamps. Longitudinal opening was made in the posterior tibial artery and several small side branches of the artery controlled with clips. The vein was cut to length and spatulated and sewn end of vein to side of artery using running 6-0 Prolene suture. Just prior completion of the anastomosis it was forebled backbled and thoroughly flushed. Anastomosis was secured clamps released there was good pulsatile flow posterior tibial artery immediately. There was good biphasic Doppler flow at the level of the ankle which augmented approximate 75% with unclamping the graft. At this point hemostasis was obtained in all incisions using thrombin Gelfoam. The arm incisions were closed in multiple layers or running 3 0 and 4 0 Vicryl subcuticular in the skin. After hemostasis was obtained in the leg the deeper layers the incisions were closed running 3-0 Vicryl suture. The skin was then closed staples. The patient tolerated procedure well and there were no complications. Instrument sponge and needle count was correct in the case. The patient was taken to the recovery room in stable condition.  Ruta Hinds, MD [facility] Office: [phone] Pager: [phone]

## 2015-09-06 NOTE — Progress Notes (Signed)
Inpatient Diabetes Program Recommendations  AACE/ADA: New Consensus Statement on Inpatient Glycemic Control (2015)  Target Ranges:  Prepandial:   less than 140 mg/dL      Peak postprandial:   less than 180 mg/dL (1-2 hours)      Critically ill patients:  140 - 180 mg/dL  Results for [name], [name] (MRN [id number]) as of 09/06/2015 08:32  Ref. Range 09/06/2015 06:23  Glucose-Capillary Latest Ref Range: 65-99 mg/dL 229 (H)   Results for [name], [name] (MRN [id number]) as of 09/06/2015 08:32  Ref. Range 09/04/2015 15:31  Hemoglobin A1C Latest Ref Range: 4.8-5.6 % 12.7 (H)   Review of Glycemic Control  Diabetes history: DM2 Outpatient Diabetes medications: Levemir 75 units BID, Novolog 35 units TID, Metformin XR 500 mg BID Current orders for Inpatient glycemic control: None; in OR for surgical procedure   Inpatient Diabetes Program Recommendations: HgbA1C: A1C 12.7% on 09/04/15 which indicates an average glucose of 318 mg/dl. Patient needs to follow up with PCP and/or Endocrinologist regarding glycemic control.  Thanks, Barnie Alderman, RN, MSN, CDE Diabetes Coordinator Inpatient Diabetes Program [phone] (Team Pager from [place] to [place]) [phone] ([facility] office) [phone] [facility] office) [phone] [facility] office)

## 2015-09-06 NOTE — Anesthesia Postprocedure Evaluation (Signed)
Anesthesia Post Note  Patient: [name]  Procedure(s) Performed: Procedure(s) (LRB): LEFT FEMORAL-POSTERIOR TIBIAL ARTERY BYPASS GRAFT WITH COMPOSITE PTFE AND RIGHT ARM VEIN (Left) RIGHT ARM VEIN HARVEST (Right)  Patient location during evaluation: PACU Anesthesia Type: General Level of consciousness: sedated and patient cooperative Pain management: pain level controlled Vital Signs Assessment: post-procedure vital signs reviewed and stable Respiratory status: spontaneous breathing Cardiovascular status: stable Anesthetic complications: no    Last Vitals:  Filed Vitals:   09/06/15 1515 09/06/15 1600  BP: 135/82 133/68  Pulse: 90 82  Temp:    Resp: 12 14    Last Pain:  Filed Vitals:   09/06/15 1613  PainSc: [name]

## 2015-09-06 NOTE — H&P (View-Only) (Signed)
Patient is a 60 year old male who returns for followup today. He has known peripheral arterial disease. He previously underwent multiple peripheral interventions in his lower extremities by Dr. Gwenlyn Found. He ultimately ended up with a left leg bypass which failed was thought to have failed early. He has previously had vein harvested from the right leg in the left leg. He describes numbness and tingling in both legs extending from the knee down that the foot. He also describes pain in his calf which may represent some element of claudication. He does not really describe classic rest pain. However, he does complain of worsening pain overall in the left leg and foot. He states this is been slowly progressive but does not really recall if this is been worse since his arteriogram in September. He has no nonhealing wounds on his feet. He is not smoking. He has changed his diet significantly to try to improve his diabetes but still reports his glucoses regularly greater than 200.  He does get some shortness of breath at a mile and this occurs certainly before any claudication symptoms.   He recently had atherectomy and drug coated balloon angioplasty of his right popliteal artery by Dr. Gwenlyn Found.  Review of systems: Dyspnea with exertion as mentioned above. He denies chest pain.  Physical exam:  Filed Vitals:   08/08/15 1144  BP: 135/74  Pulse: 78  Temp: 98.6 F (37 C)  TempSrc: Oral  Resp: 16  Height: 5\' 11"  (1.803 m)  Weight: 241 lb (109.317 kg)  SpO2: 98%      Neck: No carotid bruits  Chest: Clear to auscultation bilaterally  Cardiac: Regular rate and rhythm without murmur  Abdomen: Soft nontender slightly obese  Extremities: 2+ femoral pulses bilaterally with absent popliteal and pedal pulses  Skin: No ulcers  Data:  Patient had bilateral ABIs today which I reviewed and interpreted. ABI on the right was 0.71 left was 0.38.  September 2016 he was 0.7 on the left and 0.8 on the right I  reviewed the patient's arteriogram by Dr. Gwenlyn Found from September 2016. This actually showed that the left femoral to posterior tibial artery bypass is widely patent rather than occluded.  Assessment: Patient with worsening symptoms in his left foot.  His posterior tibial artery bypass in the left leg was patent as of 4 months ago. The fact that his ABIs have decreased by 50% are concerning that his bypass may have occluded at this point.  Plan: Aortogram bilateral lower extremity runoff possible intervention scheduled for 08/23/2015. Risks benefits possible, locations and procedure details were discussed with the patient today including not limited to bleeding infection vessel injury contrast reaction. He understands and agrees to proceed. If we find that his bypass is patent and most likely return to regular scheduled follow-up. If his bypass is occluded we will need to consider whether or not he is a candidate for redo bypass.  Ruta Hinds, MD [facility] Office: [phone] Pager: [phone]

## 2015-09-07 LAB — GLUCOSE, CAPILLARY
Glucose-Capillary: 190 mg/dL — ABNORMAL HIGH (ref 65–99)
Glucose-Capillary: 251 mg/dL — ABNORMAL HIGH (ref 65–99)
Glucose-Capillary: 102 mg/dL — ABNORMAL HIGH (ref 65–99)
Glucose-Capillary: 150 mg/dL — ABNORMAL HIGH (ref 65–99)

## 2015-09-07 LAB — CBC
HCT: 31 % — ABNORMAL LOW (ref 39.0–52.0)
Hemoglobin: 10.3 g/dL — ABNORMAL LOW (ref 13.0–17.0)
MCH: 29.3 pg (ref 26.0–34.0)
MCHC: 33.2 g/dL (ref 30.0–36.0)
MCV: 88.3 fL (ref 78.0–100.0)
PLATELETS: 206 10*3/uL (ref 150–400)
RBC: 3.51 MIL/uL — AB (ref 4.22–5.81)
RDW: 13.4 % (ref 11.5–15.5)
WBC: 10.1 10*3/uL (ref 4.0–10.5)

## 2015-09-07 LAB — BASIC METABOLIC PANEL
ANION GAP: 12 (ref 5–15)
BUN: 14 mg/dL (ref 6–20)
CO2: 24 mmol/L (ref 22–32)
Calcium: 8.3 mg/dL — ABNORMAL LOW (ref 8.9–10.3)
Chloride: 102 mmol/L (ref 101–111)
Creatinine, Ser: 1.01 mg/dL (ref 0.61–1.24)
Glucose, Bld: 238 mg/dL — ABNORMAL HIGH (ref 65–99)
POTASSIUM: 4 mmol/L (ref 3.5–5.1)
SODIUM: 138 mmol/L (ref 135–145)

## 2015-09-07 NOTE — Progress Notes (Signed)
   09/07/15 2135  Vitals  Temp 99.7 F (37.6 C)  Temp Source Oral  BP (!) 101/57 mmHg  BP Location Left Arm  BP Method Automatic  Patient Position (if appropriate) Sitting  Pulse Rate (!) 109  Pulse Rate Source Dinamap  Resp 20  Oxygen Therapy  SpO2 97 %  O2 Device Room Air   Patient arrived from [facility]. Pt. Oriented to room, call bell, and call light. CCmd called and verified. Family at bedside. All needs met. No further needs at this time. Will continue to monitor closely.

## 2015-09-07 NOTE — Evaluation (Signed)
Physical Therapy Evaluation Patient Details Name: [name] MRN: [id number] DOB: [birth year] Today's Date: 09/07/2015   History of Present Illness  This is a 60 y.o. male who is s/p Left superficial femoral to posterior tibial artery bypass using non-reversed right upper extremity vein composite PTFE. PMH/PSH: DM, previous peipheral intervention to LEs by Dr. Gwenlyn Found.  Clinical Impression  Pt tolerated OOB mobility well despite pain but even reported decrease in pain s/p ambulation. Suspect pt with progress well to d/c home with assist of girlfriend with use of recommended DME.    Follow Up Recommendations No PT follow up;Supervision/Assistance - 24 hour    Equipment Recommendations  Rolling walker with 5" wheels;3in1 (PT)    Recommendations for Other Services       Precautions / Restrictions Precautions Precautions: Fall Restrictions Weight Bearing Restrictions: No      Mobility  Bed Mobility Overal bed mobility: Needs Assistance Bed Mobility: Supine to Sit     Supine to sit: Min assist     General bed mobility comments: pt able to bring LEs off EOB, minA for trunk elevation due to limited use of R UE due to pain  Transfers Overall transfer level: Needs assistance Equipment used: Rolling walker (2 wheeled) Transfers: Sit to/from Stand Sit to Stand: Min guard         General transfer comment: v/c's to push up from bed, increased time but did not need physical assist  Ambulation/Gait Ambulation/Gait assistance: Min guard Ambulation Distance (Feet): 75 Feet Assistive device: Rolling walker (2 wheeled) Gait Pattern/deviations: Step-through pattern;Decreased stride length Gait velocity: slow Gait velocity interpretation: Below normal speed for age/gender General Gait Details: pt initially with antalgic gait with short steps however progressed to increased step length and more fluid gait pattern with time. pt reported decreased pain as well.  Stairs             Wheelchair Mobility    Modified Rankin (Stroke Patients Only)       Balance Overall balance assessment: Needs assistance             Standing balance comment: needs RW for amb                             Pertinent Vitals/Pain Pain Assessment: 0-10 Pain Score: 10-Worst pain ever (10 prior to PT, 6-7 after ambulation) Pain Location: L LE, heel primarily Pain Descriptors / Indicators: Burning;Throbbing Pain Intervention(s): Monitored during session    Home Living Family/patient expects to be discharged to:: Private residence Living Arrangements: Spouse/significant other Available Help at Discharge: Family;Friend(s);Available 24 hours/day Type of Home: House Home Access: Stairs to enter Entrance Stairs-Rails: Can reach both;Right;Left Entrance Stairs-Number of Steps: 3 Home Layout: One level Home Equipment: None      Prior Function Level of Independence: Independent               Hand Dominance        Extremity/Trunk Assessment   Upper Extremity Assessment: RUE deficits/detail RUE Deficits / Details: full AROM just slow and guarded due to soreness         Lower Extremity Assessment: LLE deficits/detail   LLE Deficits / Details: slow and guarded due to pain  Cervical / Trunk Assessment: Normal  Communication   Communication: No difficulties  Cognition Arousal/Alertness: Awake/alert Behavior During Therapy: WFL for tasks assessed/performed Overall Cognitive Status: Within Functional Limits for tasks assessed  General Comments General comments (skin integrity, edema, etc.): L foot red and swollen    Exercises General Exercises - Lower Extremity Ankle Circles/Pumps: AROM;Both;10 reps;Seated Long Arc Quad: AROM;Both;10 reps;Seated Hip Flexion/Marching: AROM;Left;5 reps;Seated      Assessment/Plan    PT Assessment Patient needs continued PT services  PT Diagnosis Difficulty walking;Abnormality of  gait;Generalized weakness;Acute pain   PT Problem List Decreased strength;Decreased activity tolerance;Decreased balance;Decreased mobility;Decreased knowledge of use of DME  PT Treatment Interventions DME instruction;Gait training;Stair training;Functional mobility training;Therapeutic activities;Therapeutic exercise;Balance training   PT Goals (Current goals can be found in the Care Plan section) Acute Rehab PT Goals Patient Stated Goal: home PT Goal Formulation: With patient Time For Goal Achievement: 09/14/15 Potential to Achieve Goals: Good    Frequency Min 3X/week   Barriers to discharge        Co-evaluation               End of Session Equipment Utilized During Treatment: Gait belt Activity Tolerance: Patient tolerated treatment well Patient left: in chair;with call bell/phone within reach Nurse Communication: Mobility status         Time: [id number] PT Time Calculation (min) (ACUTE ONLY): 33 min   Charges:   PT Evaluation $PT Eval Moderate Complexity: 1 Procedure PT Treatments $Gait Training: 8-22 mins   PT G CodesKingsley Callander 09/07/2015, 9:28 AM  Kittie Plater, PT, DPT Pager #: [phone] Office #: [phone]

## 2015-09-07 NOTE — Progress Notes (Addendum)
  Progress Note    09/07/2015 9:08 AM 1 Day Post-Op  Subjective:  No complaints  Afebrile HR  80's-90's NSR 99991111 systolic [id number]  Filed Vitals:   09/07/15 0400 09/07/15 0755  BP: 120/65 112/60  Pulse: 82 87  Temp:  98.2 F (36.8 C)  Resp: 17 18    Physical Exam: Cardiac:  regular Lungs:  Non labored Incisions:  Right arm incisions are c/d/i; left lower incisions are clean and dry with staples in tact.  Left thigh incision with clean bandage Extremities:  Brisk left peroneal/PT doppler signals and monophasic left DP   CBC    Component Value Date/Time   WBC 10.1 09/07/2015 0032   RBC 3.51* 09/07/2015 0032   HGB 10.3* 09/07/2015 0032   HCT 31.0* 09/07/2015 0032   PLT 206 09/07/2015 0032   MCV 88.3 09/07/2015 0032   MCH 29.3 09/07/2015 0032   MCHC 33.2 09/07/2015 0032   RDW 13.4 09/07/2015 0032   LYMPHSABS 2.1 07/07/2010 1613   MONOABS 0.6 07/07/2010 1613   EOSABS 0.1 07/07/2010 1613   BASOSABS 0.0 07/07/2010 1613    BMET    Component Value Date/Time   NA 138 09/07/2015 0032   K 4.0 09/07/2015 0032   CL 102 09/07/2015 0032   CO2 24 09/07/2015 0032   GLUCOSE 238* 09/07/2015 0032   BUN 14 09/07/2015 0032   CREATININE 1.01 09/07/2015 0032   CREATININE 0.93 03/22/2015 0001   CALCIUM 8.3* 09/07/2015 0032   GFRNONAA >60 09/07/2015 0032   GFRAA >60 09/07/2015 0032    INR    Component Value Date/Time   INR 0.95 09/04/2015 1530     Intake/Output Summary (Last 24 hours) at 09/07/15 0908 Last data filed at 09/07/15 0800  Gross per 24 hour  Intake 5624.58 ml  Output   3505 ml  Net 2119.58 ml     Assessment:  60 y.o. male is s/p:  Left superficial femoral to posterior tibial artery bypass using non-reversed right upper extremity vein composite PTFE  1 Day Post-Op  Plan: -pt doing well this morning with patent left leg bypass with brisk doppler flow left PT/peroneal.  Monophasic flow left DP -walked with PT already this morning and did  well -transfer to 2 west and continue increasing mobilization -elevate right arm -DVT prophylaxis:  Lovenox   Leontine Locket, PA-C [facility] [phone] 09/07/2015 9:08 AM   Addendum  I have independently interviewed and examined the patient, and I agree with the physician assistant's findings.   Minimal right drainage at incision.  Mild L leg swelling with faintly palpable PT pulse.  Adele Barthel, MD [facility] [place] Office: [phone] Pager: [phone]  09/08/2015, 9:14 AM

## 2015-09-08 ENCOUNTER — Inpatient Hospital Stay (HOSPITAL_COMMUNITY): Payer: BLUE CROSS/BLUE SHIELD

## 2015-09-08 DIAGNOSIS — Z9889 Other specified postprocedural states: Secondary | ICD-10-CM

## 2015-09-08 LAB — GLUCOSE, CAPILLARY
GLUCOSE-CAPILLARY: 130 mg/dL — AB (ref 65–99)
Glucose-Capillary: 241 mg/dL — ABNORMAL HIGH (ref 65–99)
Glucose-Capillary: 88 mg/dL (ref 65–99)

## 2015-09-08 NOTE — Progress Notes (Addendum)
  Progress Note    09/08/2015 7:28 AM 2 Days Post-Op  Subjective:  C/o some soreness  Tm 99.7 now afebrile HR  90's-110's NSR/ST 0000000 systolic [id number] RA  Filed Vitals:   09/07/15 2135 09/08/15 0612  BP: 101/57 112/60  Pulse: 109 88  Temp: 99.7 F (37.6 C) 98.3 F (36.8 C)  Resp: 20 17    Physical Exam: Cardiac:  regular Lungs:  Non labored Incisions:  Some bloody ooze from distal incision overnight, but not actively oozing now.  Proximal incision with small hematoma laterally Extremities:  Palpable left PT pulse; LLE edema   CBC    Component Value Date/Time   WBC 10.1 09/07/2015 0032   RBC 3.51* 09/07/2015 0032   HGB 10.3* 09/07/2015 0032   HCT 31.0* 09/07/2015 0032   PLT 206 09/07/2015 0032   MCV 88.3 09/07/2015 0032   MCH 29.3 09/07/2015 0032   MCHC 33.2 09/07/2015 0032   RDW 13.4 09/07/2015 0032   LYMPHSABS 2.1 07/07/2010 1613   MONOABS 0.6 07/07/2010 1613   EOSABS 0.1 07/07/2010 1613   BASOSABS 0.0 07/07/2010 1613    BMET    Component Value Date/Time   NA 138 09/07/2015 0032   K 4.0 09/07/2015 0032   CL 102 09/07/2015 0032   CO2 24 09/07/2015 0032   GLUCOSE 238* 09/07/2015 0032   BUN 14 09/07/2015 0032   CREATININE 1.01 09/07/2015 0032   CREATININE 0.93 03/22/2015 0001   CALCIUM 8.3* 09/07/2015 0032   GFRNONAA >60 09/07/2015 0032   GFRAA >60 09/07/2015 0032    INR    Component Value Date/Time   INR 0.95 09/04/2015 1530     Intake/Output Summary (Last 24 hours) at 09/08/15 0728 Last data filed at 09/07/15 1800  Gross per 24 hour  Intake   1615 ml  Output   1150 ml  Net    465 ml     Assessment:  60 y.o. male is s/p:  Left superficial femoral to posterior tibial artery bypass using non-reversed right upper extremity vein composite PTFE   2 Days Post-Op  Plan: -pt with patent bypass graft with palpable left PT -bloody ooze from distal incision-not actively oozing now; some fullness at proximal incision-most likely hematoma,  which will resolve. -ambulated 3x yesterday -heplock IVF -DVT prophylaxis:  Lovenox -anticipate discharge home soon   Leontine Locket, [place] [facility] [phone] 09/08/2015 7:28 AM   Addendum  I have independently interviewed and examined the patient, and I agree with the physician assistant's findings.  More swollen left leg today, not unexpected.  Awaiting ABI.  Suspect home Mon/Tuesday  Adele Barthel, MD [facility] [place] Office: [phone] Pager: [phone]  09/08/2015, 10:02 AM

## 2015-09-08 NOTE — Progress Notes (Signed)
VASCULAR LAB PRELIMINARY  ARTERIAL  ABI completed:    RIGHT    LEFT    PRESSURE WAVEFORM  PRESSURE WAVEFORM  BRACHIAL 131 Tri BRACHIAL 127 Tri  DP 66 mono DP    AT   AT 100 mono  PT 82 mono PT 152 Tri   PER   PER    GREAT TOE  NA GREAT TOE  NA    RIGHT LEFT  ABI 0.63 1.16   Dr. Bridgett Larsson ok'd doing ABI even though cuffs would go over incisions.     Landry Mellow, RDMS, RVT  09/08/2015, 1:00 PM

## 2015-09-09 ENCOUNTER — Encounter (HOSPITAL_COMMUNITY): Payer: Self-pay | Admitting: Vascular Surgery

## 2015-09-09 LAB — POCT I-STAT 4, (NA,K, GLUC, HGB,HCT)
Glucose, Bld: 210 mg/dL — ABNORMAL HIGH (ref 65–99)
HCT: 33 % — ABNORMAL LOW (ref 39.0–52.0)
Hemoglobin: 11.2 g/dL — ABNORMAL LOW (ref 13.0–17.0)
POTASSIUM: 4.2 mmol/L (ref 3.5–5.1)
SODIUM: 135 mmol/L (ref 135–145)

## 2015-09-09 LAB — GLUCOSE, CAPILLARY
GLUCOSE-CAPILLARY: 92 mg/dL (ref 65–99)
Glucose-Capillary: 107 mg/dL — ABNORMAL HIGH (ref 65–99)

## 2015-09-09 MED ORDER — METOPROLOL SUCCINATE ER 25 MG PO TB24: 25.0000 mg | ORAL_TABLET | Freq: Every day | ORAL | Status: DC

## 2015-09-09 MED ORDER — OXYCODONE-ACETAMINOPHEN 5-325 MG PO TABS: 1.0000 | ORAL_TABLET | ORAL | Status: DC | PRN

## 2015-09-09 NOTE — Progress Notes (Signed)
Physical Therapy Treatment Patient Details Name: [name] MRN: [id number] DOB: 02/12/1956 Today's Date: 09/09/2015    History of Present Illness This is a 60 y.o. male who is s/p Left superficial femoral to posterior tibial artery bypass using non-reversed right upper extremity vein composite PTFE. PMH/PSH: DM, previous peipheral intervention to LEs by Dr. Gwenlyn Found.    PT Comments    Pt performed increased gait distance to 250 ft with decreased pain. Pt performed stair training x 3 stairs with S for technique.  Pt tolerated tx well and set to d/c this am.  Pt educated on continued mobility and ROM at d/c.  Pt able to teach back education post education from PTA.    Follow Up Recommendations  No PT follow up;Supervision/Assistance - 24 hour     Equipment Recommendations       Recommendations for Other Services       Precautions / Restrictions Precautions Precautions: Fall Restrictions Weight Bearing Restrictions: No    Mobility  Bed Mobility Overal bed mobility:  (not assessed discussed technique with patient and no further concerns noted.  )                Transfers Overall transfer level: Independent (Pt standing at sink brushing beard in front of mirror at sink.  Pt performed toilet transfer independently as well.  )                  Ambulation/Gait Ambulation/Gait assistance: Independent;Supervision (supervision initially for cueing of reciprocal armswing and weight shifting.  ) Ambulation Distance (Feet): 250 Feet Assistive device: None Gait Pattern/deviations: Decreased stance time - left;Antalgic;Step-through pattern Gait velocity: slow   General Gait Details: Step length improved with decreased knee flexion in swing phase on LLE.  Pt edcuated to continue walking at home to improve gait symmetry as pain resolves.     Stairs Stairs: Yes Stairs assistance: Supervision Stair Management: Two rails Number of Stairs: 3 General stair comments: Cues for  sequencing to ascend.  Pt performed descending sequencing correctly without cueing.  Pt performed forward non-reciprocal pattern.    Wheelchair Mobility    Modified Rankin (Stroke Patients Only)       Balance                                    Cognition Arousal/Alertness: Awake/alert Behavior During Therapy: WFL for tasks assessed/performed Overall Cognitive Status: Within Functional Limits for tasks assessed                      Exercises      General Comments        Pertinent Vitals/Pain Pain Assessment: 0-10 Pain Score: 2  Pain Intervention(s): Monitored during session    Home Living                      Prior Function            PT Goals (current goals can now be found in the care plan section) Acute Rehab PT Goals Potential to Achieve Goals: Good Progress towards PT goals: Progressing toward goals (all goals met for d/c only required supervision for stair training due to first time performing.  )    Frequency  Min 3X/week    PT Plan      Co-evaluation             End of  Session Equipment Utilized During Treatment: Gait belt Activity Tolerance: Patient tolerated treatment well Patient left: in chair;with call bell/phone within reach     Time: 0828-0842 PT Time Calculation (min) (ACUTE ONLY): 14 min  Charges:  $Gait Training: 8-22 mins                    G Codes:      Cristela Blue 09/30/2015, 8:54 AM  Governor Rooks, PTA pager [phone]

## 2015-09-09 NOTE — Progress Notes (Signed)
Order received to discharge.  Telemetry removed and CCMD notified.  IV removed with catheter intact.  Discharge education given with wife at bedside.  All questions answered.  Pt states does not  Need 3in1 or walker.  Pt denies pain to incision sites.  Pt denies chest pain or sob.  Pt stable at discharge.

## 2015-09-09 NOTE — Care Management Note (Signed)
Case Management Note Marvetta Gibbons RN, BSN Unit 2W-Case Manager [phone]  Patient Details  Name: [name] MRN: [id number] Date of Birth: [birth year]  Subjective/Objective:  Pt admitted with PAD s/p bypass graft                  Action/Plan: PTA pt lived at home- no recommendations for PT follow up- DME orders placed for RW and 3n1- on day of discharge- pt stated that he did not want DME any longer- did not feel like he needed them- no DME delivered to pt. No further DM needs noted  Expected Discharge Date:    09/09/15             Expected Discharge Plan:  Home/Self Care  In-House Referral:     Discharge planning Services  CM Consult  Post Acute Care Choice:  Durable Medical Equipment Choice offered to:     DME Arranged:  3-N-1, Walker rolling DME Agency:  [place]:    [place] Agency:     Status of Service:  Completed, signed off  Medicare Important Message Given:    Date Medicare IM Given:    Medicare IM give by:    Date Additional Medicare IM Given:    Additional Medicare Important Message give by:     If discussed at Industry of Stay Meetings, dates discussed:    Discharge Disposition: Home/self care   Additional Comments:  Dawayne Patricia, RN 09/09/2015, 11:03 AM
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
## 2015-09-09 NOTE — Progress Notes (Addendum)
  [facility] Progress Note  Subjective  - POD #3  No complaints today. Ready to go home.   Objective Filed Vitals:   09/08/15 2113 09/09/15 0435  BP: 119/62 110/62  Pulse: 93 89  Temp: 98.9 F (37.2 C) 98.3 F (36.8 C)  Resp:  17    Intake/Output Summary (Last 24 hours) at 09/09/15 [id number] Last data filed at 09/08/15 1902  Gross per 24 hour  Intake    480 ml  Output    800 ml  Net   -320 ml    Right arm incisions c/d/i Left leg swollen intact incisions. No drainage seen.  Brisk left PT and DP doppler flow.   ABIs 09/08/15  RIGHT    LEFT    PRESSURE WAVEFORM  PRESSURE WAVEFORM  BRACHIAL 131 Tri BRACHIAL 127 Tri  DP 66 mono DP    AT   AT 100 mono  PT 82 mono PT 152 Tri   PER   PER    GREAT TOE  NA GREAT TOE  NA    RIGHT LEFT  ABI 0.63 1.16          Assessment/Planning: 60 y.o. male is s/p: Left superficial femoral to posterior tibial artery bypass using non-reversed right upper extremity vein composite PTFE 3 Days Post-Op   Bypass is patent. ABIs reflect bypass patency.  Incisions clean and intact. Encourage elevation for leg swelling.  Has ambulated well in halls.  Discharge home today. Follow-up in 2 weeks.   Alvia Grove 09/09/2015 7:42 AM --  Laboratory CBC    Component Value Date/Time   WBC 10.1 09/07/2015 0032   HGB 10.3* 09/07/2015 0032   HCT 31.0* 09/07/2015 0032   PLT 206 09/07/2015 0032    BMET    Component Value Date/Time   NA 138 09/07/2015 0032   K 4.0 09/07/2015 0032   CL 102 09/07/2015 0032   CO2 24 09/07/2015 0032   GLUCOSE 238* 09/07/2015 0032   BUN 14 09/07/2015 0032   CREATININE 1.01 09/07/2015 0032   CREATININE 0.93 03/22/2015 0001   CALCIUM 8.3* 09/07/2015 0032   GFRNONAA >60 09/07/2015 0032   GFRAA >60 09/07/2015 0032    COAG Lab Results  Component Value Date   INR 0.95 09/04/2015   INR 0.89 03/22/2015   INR 0.88 01/05/2011   No  results found for: PTT  Antibiotics Anti-infectives    Start     Dose/Rate Route Frequency Ordered Stop   09/06/15 2000  cefUROXime (ZINACEF) 1.5 g in dextrose 5 % 50 mL IVPB     1.5 g 100 mL/hr over 30 Minutes Intravenous Every 12 hours 09/06/15 1842 09/07/15 1134   09/06/15 1200  cefUROXime (ZINACEF) 1.5 g in dextrose 5 % 50 mL IVPB     1.5 g 100 mL/hr over 30 Minutes Intravenous To Surgery 09/06/15 1154 09/06/15 1210   09/06/15 0700  cefUROXime (ZINACEF) 1.5 g in dextrose 5 % 50 mL IVPB     1.5 g 100 mL/hr over 30 Minutes Intravenous To ShortStay Surgical 09/05/15 0940 09/06/15 0815       Virgina Jock, PA-C [facility] Office: [phone] Pager: [phone] 09/09/2015 7:42 AM  Addendum  I have independently interviewed and examined the patient, and I agree with the physician assistant's findings.  PT clears the pt.  He can be d/c today.  Adele Barthel, MD [facility] [place] Office: [phone] Pager: [phone]  09/09/2015, 7:56 AM

## 2015-09-09 NOTE — Evaluation (Signed)
Occupational Therapy Evaluation Patient Details Name: [name] MRN: [id number] DOB: [birth year] Today's Date: 09/09/2015    History of Present Illness This is a 60 y.o. male who is s/p Left superficial femoral to posterior tibial artery bypass using non-reversed right upper extremity vein composite PTFE. PMH/PSH: DM, previous peipheral intervention to LEs by Dr. Gwenlyn Found.   Clinical Impression   Pt is performing ADL and ADL transfers at a min assist to modified independent level.  No OT needs.  Wife will be home to help him for one week.     Follow Up Recommendations  No OT follow up    Equipment Recommendations  None recommended by OT    Recommendations for Other Services       Precautions / Restrictions Precautions Precautions: Fall Restrictions Weight Bearing Restrictions: No      Mobility Bed Mobility              General bed mobility comments: pt in chair  Transfers Overall transfer level: Modified independent Equipment used: None   Sit to Stand: Modified independent (Device/Increase time)         General transfer comment: no assist from chair    Balance                                            ADL Overall ADL's : Needs assistance/impaired Eating/Feeding: Independent   Grooming: Wash/dry hands;Standing;Modified independent   Upper Body Bathing: Set up;Standing   Lower Body Bathing: Sit to/from stand;Minimal assistance   Upper Body Dressing : Set up;Sitting   Lower Body Dressing: Minimal assistance;Sit to/from stand   Toilet Transfer: Modified Independent;Ambulation   Toileting- Clothing Manipulation and Hygiene: Modified independent         General ADL Comments: Pt's wife will assist with LB ADL, educated in availability of AE.     Vision     Perception     Praxis      Pertinent Vitals/Pain Pain Assessment: 0-10 Pain Score: 2  Pain Location: lLE Pain Descriptors / Indicators: Sore Pain Intervention(s):  Repositioned     Hand Dominance Right   Extremity/Trunk Assessment Upper Extremity Assessment Upper Extremity Assessment: Overall WFL for tasks assessed (multiple incision sites on R UE, guarded use)   Lower Extremity Assessment Lower Extremity Assessment: Defer to PT evaluation   Cervical / Trunk Assessment Cervical / Trunk Assessment: Normal   Communication Communication Communication: No difficulties   Cognition Arousal/Alertness: Awake/alert Behavior During Therapy: WFL for tasks assessed/performed Overall Cognitive Status: Within Functional Limits for tasks assessed                     General Comments       Exercises       Shoulder Instructions      Home Living Family/patient expects to be discharged to:: Private residence Living Arrangements: Spouse/significant other Available Help at Discharge: Family;Available 24 hours/day Type of Home: House Home Access: Stairs to enter [employer] of Steps: 3 Entrance Stairs-Rails: Can reach both;Right;Left Home Layout: One level     Bathroom Shower/Tub: Occupational psychologist: Standard     Home Equipment: None          Prior Functioning/Environment Level of Independence: Independent             OT Diagnosis: Generalized weakness;Acute pain   OT Problem List:  OT Treatment/Interventions:      OT Goals(Current goals can be found in the care plan section) Acute Rehab OT Goals Patient Stated Goal: home  OT Frequency:     Barriers to D/C:            Co-evaluation              End of Session Equipment Utilized During Treatment: Gait belt  Activity Tolerance: Patient tolerated treatment well Patient left: in chair;with call bell/phone within reach;with family/visitor present   Time: [id number] OT Time Calculation (min): 15 min Charges:  OT General Charges $OT Visit: 1 Procedure OT Evaluation $OT Eval Low Complexity: 1 Procedure G-Codes:    Malka So 09/09/2015, 9:45 AM

## 2015-09-11 ENCOUNTER — Telehealth: Payer: Self-pay | Admitting: Vascular Surgery

## 2015-09-11 NOTE — Telephone Encounter (Signed)
Spoke with pt, dpm °

## 2015-09-11 NOTE — Telephone Encounter (Signed)
-----   Message from Mena Goes, RN sent at 09/09/2015  9:41 AM EST ----- Regarding: schedule   ----- Message -----    From: Gabriel Earing, PA-C    Sent: 09/07/2015   9:14 AM      To: [facility] Charge Pool  S/p left leg bypass with right arm vein harvest.  F/u with [facility] in 2 weeks.  Thanks, Aldona Bar

## 2015-09-20 ENCOUNTER — Other Ambulatory Visit: Payer: Self-pay | Admitting: Cardiovascular Disease

## 2015-09-20 NOTE — Telephone Encounter (Signed)
Rx request sent to pharmacy.  

## 2015-09-23 ENCOUNTER — Encounter: Payer: Self-pay | Admitting: Vascular Surgery

## 2015-09-26 ENCOUNTER — Encounter: Payer: Self-pay | Admitting: Vascular Surgery

## 2015-09-26 ENCOUNTER — Ambulatory Visit (INDEPENDENT_AMBULATORY_CARE_PROVIDER_SITE_OTHER): Payer: Self-pay | Admitting: Vascular Surgery

## 2015-09-26 VITALS — BP 144/67 | HR 70 | Temp 98.9°F | Resp 18 | Ht 71.0 in | Wt 244.0 lb

## 2015-09-26 DIAGNOSIS — I739 Peripheral vascular disease, unspecified: Secondary | ICD-10-CM

## 2015-09-26 NOTE — Progress Notes (Signed)
Filed Vitals:   09/26/15 1507 09/26/15 1511  BP: 156/75 144/67  Pulse: 70   Temp: 98.9 F (37.2 C)   TempSrc: Oral   Resp: 18   Height: 5\' 11"  (1.803 m)   Weight: 244 lb (110.678 kg)   SpO2: 97%

## 2015-09-26 NOTE — Progress Notes (Signed)
Patient name: [name] MRN: [id number] DOB: 10/17/1955 Sex: male  REASON FOR VISIT: Postoperative follow-up   HPI: [name] is a 60 y.o. male who presents for follow-up status post left superficial femoral to posterior tibial artery bypass using composite non-reversed right upper extremity vein and PTFE on 09/06/2015. This was performed for rest pain. Today, the patient states that he has rest pain has completely resolved. The patient is doing well without any other complaints. He is not smoking.   Current Outpatient Prescriptions  Medication Sig Dispense Refill  . aspirin 325 MG tablet Take 325 mg by mouth daily with breakfast.     . clopidogrel (PLAVIX) 75 MG tablet TAKE 1 TABLET BY MOUTH EVERY DAY 90 tablet 3  . fenofibrate (TRICOR) 145 MG tablet Take 1 tablet (145 mg total) by mouth daily. 90 tablet 3  . gabapentin (NEURONTIN) 300 MG capsule Take 300 mg by mouth 3 (three) times daily.     [name] HYDROcodone-acetaminophen (NORCO/VICODIN) 5-325 MG tablet Take 1 tablet by mouth every 4 (four) hours as needed. 50 tablet 0  . insulin aspart (NOVOLOG) 100 UNIT/ML injection Inject 35 Units into the skin 3 (three) times daily before meals.     [name] LEVEMIR FLEXTOUCH 100 UNIT/ML Pen Inject 75 Units into the skin 2 (two) times daily. Reported on 08/21/2015  4  . lisinopril-hydrochlorothiazide (PRINZIDE,ZESTORETIC) 20-12.5 MG per tablet Take 1 tablet by mouth daily with breakfast.     . metFORMIN (GLUCOPHAGE-XR) 500 MG 24 hr tablet Take 500 mg by mouth 2 (two) times daily.    . metoprolol succinate (TOPROL-XL) 25 MG 24 hr tablet Take 1 tablet (25 mg total) by mouth daily. 30 tablet 6  . oxyCODONE-acetaminophen (PERCOCET/ROXICET) 5-325 MG tablet Take 1-2 tablets by mouth every 4 (four) hours as needed for moderate pain. 30 tablet 0  . simvastatin (ZOCOR) 80 MG tablet Take 1 tablet (80 mg total) by mouth at bedtime. 90 tablet 3   No current facility-administered medications for this visit.    REVIEW  OF SYSTEMS:  [X]  denotes positive finding, [ ]  denotes negative finding Cardiac  Comments:  Chest pain or chest pressure:    Shortness of breath upon exertion:    Short of breath when lying flat:    Irregular heart rhythm:    Constitutional    Fever or chills:      PHYSICAL EXAM: Filed Vitals:   09/26/15 1507 09/26/15 1511  BP: 156/75 144/67  Pulse: 70   Temp: 98.9 F (37.2 C)   TempSrc: Oral   Resp: 18   Height: 5\' 11"  (1.803 m)   Weight: 244 lb (110.678 kg)   SpO2: 97%     GENERAL: The patient is a well-nourished male, in no acute distress. The vital signs are documented above. PULMONARY: Nonlabored respiratory effort. VASCULAR: Brisk left PT Doppler flow. Left thigh swelling. Left thigh and lower leg stapled incisions clean and intact. Minimal serous sanguinous drainage at proximal thigh incision. Right upper arm incisions healing well. Minor official separation at proximal arm incision.   MEDICAL ISSUES: Status post left superficial femoral to posterior tibial artery bypass using composite non-reversed right upper extremity vein and PTFE on 09/06/2015  The patient is doing well postoperatively. His rest pain has resolved. His bypass is patent with brisk Doppler flow to left PT. Given that he does have some swelling in his left leg, we will remove half of his staples today. He does have some superficial separation of  his proximal right arm incision. Advised the patient to wash the wounds daily and keep them dry. He will follow-up in 2 weeks to have his remaining staples removed.  Virgina Jock, PA-C [facility] [place]   History and exam details as above. Patient has a patent left posterior tibial bypass with resolution of his rest pain symptoms. He will follow-up in a couple weeks for removal of the remainder staples. He will then need follow-up in 3 months for graft duplex scan and ABIs.  Ruta Hinds, MD [facility]  [place] Office: [phone] Pager: [phone]

## 2015-09-27 ENCOUNTER — Inpatient Hospital Stay (HOSPITAL_COMMUNITY): Admission: RE | Admit: 2015-09-27 | Payer: BLUE CROSS/BLUE SHIELD | Source: Ambulatory Visit

## 2015-10-07 ENCOUNTER — Encounter: Payer: Self-pay | Admitting: Family

## 2015-10-07 DIAGNOSIS — Z0279 Encounter for issue of other medical certificate: Secondary | ICD-10-CM

## 2015-10-10 ENCOUNTER — Encounter (HOSPITAL_BASED_OUTPATIENT_CLINIC_OR_DEPARTMENT_OTHER): Payer: Self-pay | Admitting: *Deleted

## 2015-10-10 ENCOUNTER — Inpatient Hospital Stay (HOSPITAL_BASED_OUTPATIENT_CLINIC_OR_DEPARTMENT_OTHER)
Admission: EM | Admit: 2015-10-10 | Discharge: 2015-10-12 | DRG: 065 | Disposition: A | Discharge status: Home / Self Care | Payer: BLUE CROSS/BLUE SHIELD | Attending: Internal Medicine | Admitting: Internal Medicine

## 2015-10-10 ENCOUNTER — Emergency Department (HOSPITAL_BASED_OUTPATIENT_CLINIC_OR_DEPARTMENT_OTHER): Payer: BLUE CROSS/BLUE SHIELD

## 2015-10-10 DIAGNOSIS — R29810 Facial weakness: Secondary | ICD-10-CM | POA: Diagnosis present

## 2015-10-10 DIAGNOSIS — E669 Obesity, unspecified: Secondary | ICD-10-CM | POA: Diagnosis present

## 2015-10-10 DIAGNOSIS — Z951 Presence of aortocoronary bypass graft: Secondary | ICD-10-CM | POA: Diagnosis not present

## 2015-10-10 DIAGNOSIS — Z7902 Long term (current) use of antithrombotics/antiplatelets: Secondary | ICD-10-CM

## 2015-10-10 DIAGNOSIS — I252 Old myocardial infarction: Secondary | ICD-10-CM | POA: Diagnosis not present

## 2015-10-10 DIAGNOSIS — E1142 Type 2 diabetes mellitus with diabetic polyneuropathy: Secondary | ICD-10-CM | POA: Diagnosis present

## 2015-10-10 DIAGNOSIS — Z7982 Long term (current) use of aspirin: Secondary | ICD-10-CM | POA: Diagnosis not present

## 2015-10-10 DIAGNOSIS — I639 Cerebral infarction, unspecified: Secondary | ICD-10-CM | POA: Diagnosis present

## 2015-10-10 DIAGNOSIS — Z9861 Coronary angioplasty status: Secondary | ICD-10-CM | POA: Diagnosis not present

## 2015-10-10 DIAGNOSIS — I651 Occlusion and stenosis of basilar artery: Secondary | ICD-10-CM | POA: Diagnosis present

## 2015-10-10 DIAGNOSIS — Z85828 Personal history of other malignant neoplasm of skin: Secondary | ICD-10-CM

## 2015-10-10 DIAGNOSIS — I739 Peripheral vascular disease, unspecified: Secondary | ICD-10-CM | POA: Diagnosis not present

## 2015-10-10 DIAGNOSIS — R4781 Slurred speech: Secondary | ICD-10-CM | POA: Diagnosis present

## 2015-10-10 DIAGNOSIS — E781 Pure hyperglyceridemia: Secondary | ICD-10-CM | POA: Diagnosis present

## 2015-10-10 DIAGNOSIS — G8194 Hemiplegia, unspecified affecting left nondominant side: Secondary | ICD-10-CM | POA: Diagnosis present

## 2015-10-10 DIAGNOSIS — I10 Essential (primary) hypertension: Secondary | ICD-10-CM | POA: Diagnosis present

## 2015-10-10 DIAGNOSIS — IMO0002 Reserved for concepts with insufficient information to code with codable children: Secondary | ICD-10-CM

## 2015-10-10 DIAGNOSIS — R531 Weakness: Secondary | ICD-10-CM | POA: Diagnosis present

## 2015-10-10 DIAGNOSIS — Z6833 Body mass index (BMI) 33.0-33.9, adult: Secondary | ICD-10-CM | POA: Diagnosis not present

## 2015-10-10 DIAGNOSIS — E1165 Type 2 diabetes mellitus with hyperglycemia: Secondary | ICD-10-CM | POA: Diagnosis present

## 2015-10-10 DIAGNOSIS — Z79899 Other long term (current) drug therapy: Secondary | ICD-10-CM

## 2015-10-10 DIAGNOSIS — Z794 Long term (current) use of insulin: Secondary | ICD-10-CM

## 2015-10-10 DIAGNOSIS — R4701 Aphasia: Secondary | ICD-10-CM | POA: Diagnosis present

## 2015-10-10 DIAGNOSIS — Z7984 Long term (current) use of oral hypoglycemic drugs: Secondary | ICD-10-CM

## 2015-10-10 DIAGNOSIS — E0865 Diabetes mellitus due to underlying condition with hyperglycemia: Secondary | ICD-10-CM | POA: Diagnosis not present

## 2015-10-10 DIAGNOSIS — E1151 Type 2 diabetes mellitus with diabetic peripheral angiopathy without gangrene: Secondary | ICD-10-CM

## 2015-10-10 DIAGNOSIS — I6789 Other cerebrovascular disease: Secondary | ICD-10-CM | POA: Diagnosis not present

## 2015-10-10 DIAGNOSIS — I2510 Atherosclerotic heart disease of native coronary artery without angina pectoris: Secondary | ICD-10-CM | POA: Diagnosis present

## 2015-10-10 DIAGNOSIS — Z86718 Personal history of other venous thrombosis and embolism: Secondary | ICD-10-CM

## 2015-10-10 DIAGNOSIS — E119 Type 2 diabetes mellitus without complications: Secondary | ICD-10-CM

## 2015-10-10 HISTORY — DX: Cerebral infarction, unspecified: I63.9

## 2015-10-10 LAB — COMPREHENSIVE METABOLIC PANEL
ALBUMIN: 3.4 g/dL — AB (ref 3.5–5.0)
ALK PHOS: 69 U/L (ref 38–126)
ALT: 18 U/L (ref 17–63)
ANION GAP: 11 (ref 5–15)
AST: 22 U/L (ref 15–41)
BUN: 20 mg/dL (ref 6–20)
CALCIUM: 9.4 mg/dL (ref 8.9–10.3)
CHLORIDE: 99 mmol/L — AB (ref 101–111)
CO2: 25 mmol/L (ref 22–32)
Creatinine, Ser: 1.02 mg/dL (ref 0.61–1.24)
GFR calc non Af Amer: >60 mL/min (ref >60)
GLUCOSE: 379 mg/dL — AB (ref 65–99)
Potassium: 4.1 mmol/L (ref 3.5–5.1)
SODIUM: 135 mmol/L (ref 135–145)
Total Bilirubin: 0.7 mg/dL (ref 0.3–1.2)
Total Protein: 6.8 g/dL (ref 6.5–8.1)

## 2015-10-10 LAB — RAPID URINE DRUG SCREEN, HOSP PERFORMED
AMPHETAMINES: NOT DETECTED
BARBITURATES: NOT DETECTED
BENZODIAZEPINES: NOT DETECTED
COCAINE: NOT DETECTED
Opiates: NOT DETECTED
TETRAHYDROCANNABINOL: NOT DETECTED

## 2015-10-10 LAB — URINALYSIS, ROUTINE W REFLEX MICROSCOPIC
BILIRUBIN URINE: NEGATIVE
HGB URINE DIPSTICK: NEGATIVE
KETONES UR: NEGATIVE mg/dL
Leukocytes, UA: NEGATIVE
Nitrite: NEGATIVE
PROTEIN: NEGATIVE mg/dL
Specific Gravity, Urine: 1.037 — ABNORMAL HIGH (ref 1.005–1.030)
pH: 6.5 (ref 5.0–8.0)

## 2015-10-10 LAB — CBC
HCT: 38 % — ABNORMAL LOW (ref 39.0–52.0)
Hemoglobin: 12.4 g/dL — ABNORMAL LOW (ref 13.0–17.0)
MCH: 28.5 pg (ref 26.0–34.0)
MCHC: 32.6 g/dL (ref 30.0–36.0)
MCV: 87.4 fL (ref 78.0–100.0)
PLATELETS: 296 10*3/uL (ref 150–400)
RBC: 4.35 MIL/uL (ref 4.22–5.81)
RDW: 13.6 % (ref 11.5–15.5)
WBC: 8.4 10*3/uL (ref 4.0–10.5)

## 2015-10-10 LAB — ETHANOL

## 2015-10-10 LAB — DIFFERENTIAL
BASOS PCT: 0 %
Basophils Absolute: 0 10*3/uL (ref 0.0–0.1)
EOS PCT: 3 %
Eosinophils Absolute: 0.3 10*3/uL (ref 0.0–0.7)
LYMPHS PCT: 24 %
Lymphs Abs: 2 10*3/uL (ref 0.7–4.0)
MONO ABS: 0.7 10*3/uL (ref 0.1–1.0)
Monocytes Relative: 9 %
NEUTROS ABS: 5.3 10*3/uL (ref 1.7–7.7)
NEUTROS PCT: 64 %

## 2015-10-10 LAB — PROTIME-INR
INR: 0.9 (ref 0.00–1.49)
PROTHROMBIN TIME: 12.4 s (ref 11.6–15.2)

## 2015-10-10 LAB — TROPONIN I

## 2015-10-10 LAB — CBG MONITORING, ED
GLUCOSE-CAPILLARY: 284 mg/dL — AB (ref 65–99)
Glucose-Capillary: 227 mg/dL — ABNORMAL HIGH (ref 65–99)
Glucose-Capillary: 297 mg/dL — ABNORMAL HIGH (ref 65–99)

## 2015-10-10 LAB — URINE MICROSCOPIC-ADD ON
BACTERIA UA: NONE SEEN
WBC UA: NONE SEEN WBC/hpf (ref 0–5)

## 2015-10-10 LAB — APTT: aPTT: 28 seconds (ref 24–37)

## 2015-10-10 LAB — GLUCOSE, CAPILLARY: Glucose-Capillary: 269 mg/dL — ABNORMAL HIGH (ref 65–99)

## 2015-10-10 MED ORDER — HYDROCHLOROTHIAZIDE 12.5 MG PO CAPS
12.5000 mg | ORAL_CAPSULE | Freq: Every day | ORAL | Status: DC
  Administered 2015-10-11 – 2015-10-12 (×2): 12.5 mg via ORAL
  Filled 2015-10-10 (×2): qty 1

## 2015-10-10 MED ORDER — STROKE: EARLY STAGES OF RECOVERY BOOK
Freq: Once | Status: AC
  Administered 2015-10-11
  Filled 2015-10-10 (×2): qty 1

## 2015-10-10 MED ORDER — LISINOPRIL-HYDROCHLOROTHIAZIDE 20-12.5 MG PO TABS
1.0000 | ORAL_TABLET | Freq: Every day | ORAL | Status: DC
Start: 2015-10-11 — End: 2015-10-10

## 2015-10-10 MED ORDER — ATORVASTATIN CALCIUM 40 MG PO TABS
40.0000 mg | ORAL_TABLET | Freq: Every day | ORAL | Status: DC
  Administered 2015-10-11: 40 mg via ORAL
  Filled 2015-10-10: qty 1

## 2015-10-10 MED ORDER — FENOFIBRATE 54 MG PO TABS
54.0000 mg | ORAL_TABLET | Freq: Every day | ORAL | Status: DC
  Administered 2015-10-11 – 2015-10-12 (×2): 54 mg via ORAL
  Filled 2015-10-10 (×3): qty 1

## 2015-10-10 MED ORDER — INSULIN DETEMIR 100 UNIT/ML ~~LOC~~ SOLN
75.0000 [IU] | Freq: Two times a day (BID) | SUBCUTANEOUS | Status: DC
  Administered 2015-10-11 – 2015-10-12 (×4): 75 [IU] via SUBCUTANEOUS
  Filled 2015-10-10 (×6): qty 0.75

## 2015-10-10 MED ORDER — INSULIN ASPART 100 UNIT/ML ~~LOC~~ SOLN
6.0000 [IU] | Freq: Once | SUBCUTANEOUS | Status: AC
  Administered 2015-10-10: 6 [IU] via SUBCUTANEOUS
  Filled 2015-10-10: qty 1

## 2015-10-10 MED ORDER — LISINOPRIL 20 MG PO TABS
20.0000 mg | ORAL_TABLET | Freq: Every day | ORAL | Status: DC
  Administered 2015-10-11 – 2015-10-12 (×2): 20 mg via ORAL
  Filled 2015-10-10 (×2): qty 1

## 2015-10-10 MED ORDER — ASPIRIN 81 MG PO CHEW
324.0000 mg | CHEWABLE_TABLET | Freq: Once | ORAL | Status: AC
  Administered 2015-10-10: 324 mg via ORAL
  Filled 2015-10-10: qty 4

## 2015-10-10 MED ORDER — METOPROLOL SUCCINATE ER 25 MG PO TB24
25.0000 mg | ORAL_TABLET | Freq: Every day | ORAL | Status: DC
  Administered 2015-10-11 – 2015-10-12 (×2): 25 mg via ORAL
  Filled 2015-10-10 (×2): qty 1

## 2015-10-10 MED ORDER — CLOPIDOGREL BISULFATE 75 MG PO TABS
75.0000 mg | ORAL_TABLET | Freq: Every day | ORAL | Status: DC
Start: 2015-10-11 — End: 2015-10-12
  Administered 2015-10-11 – 2015-10-12 (×2): 75 mg via ORAL
  Filled 2015-10-10 (×2): qty 1

## 2015-10-10 MED ORDER — INSULIN ASPART 100 UNIT/ML ~~LOC~~ SOLN
0.0000 [IU] | Freq: Three times a day (TID) | SUBCUTANEOUS | Status: DC
  Administered 2015-10-11: 3 [IU] via SUBCUTANEOUS
  Administered 2015-10-11: 7 [IU] via SUBCUTANEOUS
  Administered 2015-10-11 – 2015-10-12 (×2): 4 [IU] via SUBCUTANEOUS
  Administered 2015-10-12: 10 [IU] via SUBCUTANEOUS

## 2015-10-10 MED ORDER — SENNOSIDES-DOCUSATE SODIUM 8.6-50 MG PO TABS: 1.0000 | ORAL_TABLET | Freq: Every evening | ORAL | Status: DC | PRN

## 2015-10-10 MED ORDER — INSULIN ASPART 100 UNIT/ML ~~LOC~~ SOLN
0.0000 [IU] | Freq: Every day | SUBCUTANEOUS | Status: DC
  Administered 2015-10-11: 3 [IU] via SUBCUTANEOUS

## 2015-10-10 MED ORDER — GABAPENTIN 300 MG PO CAPS
300.0000 mg | ORAL_CAPSULE | Freq: Three times a day (TID) | ORAL | Status: DC
  Administered 2015-10-11 – 2015-10-12 (×5): 300 mg via ORAL
  Filled 2015-10-10 (×5): qty 1

## 2015-10-10 MED ORDER — ASPIRIN 325 MG PO TABS
325.0000 mg | ORAL_TABLET | Freq: Every day | ORAL | Status: DC
  Administered 2015-10-11 – 2015-10-12 (×2): 325 mg via ORAL
  Filled 2015-10-10 (×2): qty 1

## 2015-10-10 MED ORDER — METFORMIN HCL ER 500 MG PO TB24
500.0000 mg | ORAL_TABLET | Freq: Two times a day (BID) | ORAL | Status: DC
  Administered 2015-10-11 – 2015-10-12 (×4): 500 mg via ORAL
  Filled 2015-10-10 (×4): qty 1

## 2015-10-10 MED ORDER — ENOXAPARIN SODIUM 40 MG/0.4ML ~~LOC~~ SOLN
40.0000 mg | SUBCUTANEOUS | Status: DC
  Administered 2015-10-11 – 2015-10-12 (×2): 40 mg via SUBCUTANEOUS
  Filled 2015-10-10 (×2): qty 0.4

## 2015-10-10 MED ORDER — CLOPIDOGREL BISULFATE 75 MG PO TABS
75.0000 mg | ORAL_TABLET | Freq: Once | ORAL | Status: AC
  Administered 2015-10-10: 75 mg via ORAL
  Filled 2015-10-10: qty 1

## 2015-10-10 NOTE — ED Notes (Signed)
[facility] is aware of the room5 to  5C13  Transfer.

## 2015-10-10 NOTE — ED Notes (Signed)
Pt in CT.
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
## 2015-10-10 NOTE — ED Notes (Signed)
Pt has a urinal at bedside and is aware of need for sample 

## 2015-10-10 NOTE — ED Notes (Signed)
Family at bedside. 

## 2015-10-10 NOTE — Progress Notes (Signed)
60 y/o male presents with left side weakness, and aphasia X 2 days, rCT head with no acute CVA, but high clinical suspicion for CVA ,patient accepted to telemetry for CVA workup. - Recent left lower extremity bypass surgery by vascular surgery, still have staples . - Patient on aspirin and Plavix at home, he already received minutes in ED. Phillips Climes MD

## 2015-10-10 NOTE — Progress Notes (Signed)
Received from [facility] into 5C04.  A&Ox4, unsteady gait, tele applied, oriented to room, safety precautions & orders.  Family at bedside.

## 2015-10-10 NOTE — ED Notes (Signed)
Patient states around 1200 noon two days ago he developed a sudden onset of left side weakness and slurred speech.  States he had vascular surgery on his left leg four weeks ago, and was doing great until two days ago.  States now, he does not feel good.  States his family has been trying to get him to come in.  Patient has an obvious left facial drooping.

## 2015-10-10 NOTE — ED Notes (Signed)
Pt on automatic VS, continuous cardiac and pulse ox monitoring. 

## 2015-10-10 NOTE — ED Notes (Signed)
Swallow screen completed without difficulty. Pt reports he has had some episodes of coughing after eating or drinking since his surgery in February
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
## 2015-10-11 ENCOUNTER — Inpatient Hospital Stay (HOSPITAL_COMMUNITY): Payer: BLUE CROSS/BLUE SHIELD

## 2015-10-11 ENCOUNTER — Ambulatory Visit: Payer: BLUE CROSS/BLUE SHIELD | Admitting: Family

## 2015-10-11 DIAGNOSIS — I639 Cerebral infarction, unspecified: Secondary | ICD-10-CM

## 2015-10-11 DIAGNOSIS — I6789 Other cerebrovascular disease: Secondary | ICD-10-CM

## 2015-10-11 LAB — LIPID PANEL
CHOLESTEROL: 392 mg/dL — AB (ref 0–200)
LDL CALC: UNDETERMINED mg/dL (ref 0–99)
TRIGLYCERIDES: 1408 mg/dL — AB (ref <150)
VLDL: UNDETERMINED mg/dL (ref 0–40)

## 2015-10-11 LAB — GLUCOSE, CAPILLARY
GLUCOSE-CAPILLARY: 182 mg/dL — AB (ref 65–99)
GLUCOSE-CAPILLARY: 186 mg/dL — AB (ref 65–99)
Glucose-Capillary: 215 mg/dL — ABNORMAL HIGH (ref 65–99)
Glucose-Capillary: 213 mg/dL — ABNORMAL HIGH (ref 65–99)

## 2015-10-11 LAB — BASIC METABOLIC PANEL
Anion gap: 10 (ref 5–15)
BUN: 18 mg/dL (ref 6–20)
CO2: 24 mmol/L (ref 22–32)
CREATININE: 0.82 mg/dL (ref 0.61–1.24)
Calcium: 8.9 mg/dL (ref 8.9–10.3)
Chloride: 102 mmol/L (ref 101–111)
GFR calc Af Amer: >60 mL/min (ref >60)
GLUCOSE: 244 mg/dL — AB (ref 65–99)
POTASSIUM: 3.8 mmol/L (ref 3.5–5.1)
Sodium: 136 mmol/L (ref 135–145)

## 2015-10-11 LAB — ECHOCARDIOGRAM COMPLETE
Height: 71 in
Weight: 3840 oz

## 2015-10-11 MED ORDER — INSULIN ASPART 100 UNIT/ML ~~LOC~~ SOLN
10.0000 [IU] | Freq: Three times a day (TID) | SUBCUTANEOUS | Status: DC
Start: 2015-10-11 — End: 2015-10-12
  Administered 2015-10-11 – 2015-10-12 (×4): 10 [IU] via SUBCUTANEOUS

## 2015-10-11 NOTE — Progress Notes (Signed)
*  PRELIMINARY RESULTS* Vascular Ultrasound Carotid Duplex (Doppler) has been completed.  Preliminary findings: Bilateral: No significant (1-39%) ICA stenosis. Antegrade vertebral flow.    Landry Mellow, RDMS, RVT  10/11/2015, 2:13 PM

## 2015-10-11 NOTE — Evaluation (Signed)
Physical Therapy Evaluation Patient Details Name: [name] MRN: [id number] DOB: 1955/09/15 Today's Date: 10/11/2015   History of Present Illness  This 60 y.o. male admitted with Lt sided weakness and slurred speech, onset ~ 2 days PTA.  Head CT negative for acute infarct.  Blood glucose was 1000.  PMH includes:  DM, PVD, HTN, CAD, s/p CABG  Clinical Impression  Pt near/at baseline with mobility.  Unable to do stairs due to neurology needing to see pt, but pt and wife have no concerns about managing these.  No further PT needs identified and will sign off.    Follow Up Recommendations No PT follow up    Equipment Recommendations  None recommended by PT    Recommendations for Other Services       Precautions / Restrictions Precautions Precautions: None      Mobility  Bed Mobility Overal bed mobility: Independent                Transfers Overall transfer level: Independent                  Ambulation/Gait Ambulation/Gait assistance: Modified independent (Device/Increase time) Ambulation Distance (Feet): 100 Feet Assistive device: None Gait Pattern/deviations: Step-through pattern     General Gait Details: Slight decrease in balance with head nods, but no LOB.  Neuroogy PA came by during gait and needed to see pt at that time.  Stairs            Wheelchair Mobility    Modified Rankin (Stroke Patients Only) Modified Rankin (Stroke Patients Only) Pre-Morbid Rankin Score: No symptoms Modified Rankin: No symptoms     Balance Overall balance assessment: No apparent balance deficits (not formally assessed)                                           Pertinent Vitals/Pain Pain Assessment: No/denies pain    Home Living Family/patient expects to be discharged to:: Private residence Living Arrangements: Spouse/significant other Available Help at Discharge: Family;Available 24 hours/day;Friend(s) Type of Home: House Home Access:  Stairs to enter Entrance Stairs-Rails: Can reach both;Right;Left Entrance Stairs-Number of Steps: 2 Home Layout: One level Home Equipment: None      Prior Function Level of Independence: Independent         Comments: Pt works full time      Journalist, newspaper   Dominant Hand: Right    Extremity/Trunk Assessment   Upper Extremity Assessment: Defer to OT evaluation           Lower Extremity Assessment: Overall WFL for tasks assessed      Cervical / Trunk Assessment: Normal  Communication   Communication: No difficulties  Cognition Arousal/Alertness: Awake/alert Behavior During Therapy: WFL for tasks assessed/performed Overall Cognitive Status: Within Functional Limits for tasks assessed                      General Comments General comments (skin integrity, edema, etc.): Wife present.  Pt and wife both feel that pt is at/near baseline.  Session shortened by PA needing to see pt, but stopped by later and spoke with wife, while pt with OT.  OT reviewed s/s of CVA.  They are not concerned about stairs.    Exercises        Assessment/Plan    PT Assessment Patent does not need any further PT services  PT Diagnosis Difficulty walking   PT Problem List    PT Treatment Interventions     PT Goals (Current goals can be found in the Care Plan section) Acute Rehab PT Goals Patient Stated Goal: to go home  PT Goal Formulation: All assessment and education complete, DC therapy    Frequency     Barriers to discharge        Co-evaluation               End of Session Equipment Utilized During Treatment: Gait belt Activity Tolerance: Patient tolerated treatment well Patient left: in chair;with call bell/phone within reach;with family/visitor present;Other (comment) (neuro PA) Nurse Communication: Mobility status         Time: [id number] PT Time Calculation (min) (ACUTE ONLY): 10 min   Charges:   PT Evaluation $PT Eval Low Complexity: 1  Procedure     PT G Codes:        Sari Cogan LUBECK 10/11/2015, 12:52 PM

## 2015-10-11 NOTE — Progress Notes (Signed)
  Echocardiogram 2D Echocardiogram has been performed.  [name] 10/11/2015, 2:42 PM

## 2015-10-11 NOTE — Consult Note (Signed)
Pt was scheduled for office visit today from left posterior tibial bypass about 5 weeks ago.  He was admitted with stroke today.  His right arm and left leg incisions are healed.  He has no symptoms of rest pain or claudication.  Will d/c staples today.  Carotid duplex pending.  Please call us if he has significant carotid disease as source of his CVA  Ruta Hinds, MD [facility]: [phone] Pager: [phone]

## 2015-10-11 NOTE — Care Management Note (Signed)
Case Management Note  Patient Details  Name: [name] MRN: [id number] Date of Birth: 10/26/1955  Subjective/Objective:                    Action/Plan: Patient was admitted for a CVA. Will follow for discharge needs pending PT/OT evals and physician orders.  Expected Discharge Date:  10/12/15               Expected Discharge Plan:     In-House Referral:     Discharge planning Services     Post Acute Care Choice:    Choice offered to:     DME Arranged:    DME Agency:     HH Arranged:    HH Agency:     Status of Service:  In process, will continue to follow  Medicare Important Message Given:    Date Medicare IM Given:    Medicare IM give by:    Date Additional Medicare IM Given:    Additional Medicare Important Message give by:     If discussed at [place] of Stay Meetings, dates discussed:    Additional CommentsRolm Baptise, RN 10/11/2015, 9:09 AM [phone]

## 2015-10-11 NOTE — Evaluation (Signed)
Occupational Therapy Evaluation Patient Details Name: [name] MRN: [id number] DOB: 10-Nov-1955 Today's Date: 10/11/2015    History of Present Illness This 60 y.o. male admitted with Lt sided weakness and slurred speech, onset ~ 2 days PTA.  Head CT negative for acute infarct.  Blood glucose was 1000.  PMH includes:  DM, PVD, HTN, CAD, s/p CABG   Clinical Impression   .Patient evaluated by Occupational Therapy with no further acute OT needs identified. All education has been completed and the patient has no further questions. Pt is back to baseline.  No deficits noted.  He is independent with ADLs.  He was instructed in [facility]FAST.   See below for any follow-up Occupational Therapy or equipment needs. OT is signing off. Thank you for this referral.      Follow Up Recommendations  No OT follow up    Equipment Recommendations  None recommended by OT    Recommendations for Other Services       Precautions / Restrictions Precautions Precautions: None      Mobility Bed Mobility Overal bed mobility: Independent                Transfers Overall transfer level: Independent                    Balance Overall balance assessment: No apparent balance deficits (not formally assessed)                                          ADL Overall ADL's : Independent                                             Vision Vision Assessment?: Yes Eye Alignment: Within Functional Limits Alignment/Gaze Preference: Within Defined Limits Tracking/Visual Pursuits: Able to track stimulus in all quads without difficulty Saccades: Within functional limits Convergence: Within functional limits Additional Comments: Pt able to read without difficulty    Perception Perception Perception Tested?: Yes   Praxis Praxis Praxis tested?: Within functional limits    Pertinent Vitals/Pain Pain Assessment: No/denies pain     Hand Dominance Right    Extremity/Trunk Assessment Upper Extremity Assessment Upper Extremity Assessment: Overall WFL for tasks assessed   Lower Extremity Assessment Lower Extremity Assessment: Defer to PT evaluation   Cervical / Trunk Assessment Cervical / Trunk Assessment: Normal   Communication Communication Communication: No difficulties   Cognition Arousal/Alertness: Awake/alert Behavior During Therapy: WFL for tasks assessed/performed Overall Cognitive Status: Within Functional Limits for tasks assessed                     General Comments       Exercises       Shoulder Instructions      Home Living Family/patient expects to be discharged to:: Private residence Living Arrangements: Spouse/significant other Available Help at Discharge: Family;Available 24 hours/day;Friend(s) Type of Home: House Home Access: Stairs to enter [employer] of Steps: 2 Entrance Stairs-Rails: Can reach both;Right;Left Home Layout: One level     Bathroom Shower/Tub: Occupational psychologist: Standard     Home Equipment: None          Prior Functioning/Environment Level of Independence: Independent        Comments: Pt works  full time     OT Diagnosis:     OT Problem List:     OT Treatment/Interventions:      OT Goals(Current goals can be found in the care plan section) Acute Rehab OT Goals Patient Stated Goal: to go home  OT Goal Formulation: All assessment and education complete, DC therapy  OT Frequency:     Barriers to D/C:            Co-evaluation              End of Session Nurse Communication: Mobility status  Activity Tolerance: Patient tolerated treatment well Patient left: in bed;with call bell/phone within reach;with family/visitor present   Time: [id number] OT Time Calculation (min): 19 min Charges:  OT General Charges $OT Visit: 1 Procedure OT Evaluation $OT Eval Low Complexity: 1 Procedure G-Codes:    [name] 05-Nov-2015,  12:18 PM

## 2015-10-11 NOTE — Progress Notes (Signed)
[facility]                                                                              Patient Demographics  [name], is a 60 y.o. male, DOB - Dec 15, 1955, [id number]  Admit date - 10/10/2015   Admitting Physician Albertine Patricia, MD  Outpatient Primary MD for the patient is No PCP Per Patient  LOS - 1  days    Chief Complaint  Patient presents with  . Numbness       Brief HPI  Patient is a 60 year old male with DM2, HTN, PVD and CAD sp CABG , presented to ED this am with left sided weakness and slurred speech for 2 days. Patient reported that he was recovering well from his left lower extremity fempop tibial bypass graft done on February 17 and was supposed to see vascular surgeon on 3/24 to get the staples out. However on Tuesday, he felt weakness in his left leg, left arm with slurred speech and facial drooping. Patient felt the symptoms persisting the next day, however came to the ER on 3/23 2 days later. In ED, CT head showed mild cerebral atrophy, small lacunar infarcts in left basal ganglia and right periventricular adjacent to caudate nucleus  Assessment & Plan    Left-sided weakness with left facial droop: Symptoms have significantly improved - Not a TPA candidate due to delayed presentation - MRI/MRA pending, 2-D echo, carotid Dopplers pending - Lipid panel showed cholesterol 392, triglycerides 1408, LDL unable to calculate, on Lipitor and fenofibrate. Patient reports that he has a known history of hypertriglyceridemia, and his cardiologist, Dr Gwenlyn Found has referred him to endocrinology at [facility] for further workup. - Hemoglobin A1c pending -Patient is on aspirin 325 mg daily with Plavix prior to admission, will continue - Neurology consult called, spoke with Dr Cheral Marker    Active Problems:   PVD (peripheral vascular disease) ([place]) with recentleft LE fem-pop tibial bypass on Feb 17 - Patient still has staples, discussed with Dr. Oneida Alar, will see the  patient to assess if the staples need to be taken out this time -Continue aspirin and Plavix     Essential hypertension -Currently stable continue ACE inhibitor, beta blocker, HCTZ     Coronary artery disease, history of CABG - Currently no chest pain, shortness of breath, continue aspirin, Plavix, beta blocker,ACEI, statin, fenofibrate    Diabetes type 2, uncontrolled ([place]) - Follow hemoglobin A1c,  -Continue Levemir, added meal coverage, continue sliding scale insulin    Code Status:Full CODE STATUS  Family Communication: Discussed in detail with the patient, all imaging results, lab results explained to the patient and wife at the bedside   Disposition Plan:   Time Spent in minutes  25 minutes  Procedures  CT head  Consults   Neuro VVS  DVT Prophylaxis  Lovenox  Medications  Scheduled Meds: . aspirin  325 mg Oral Q breakfast  . atorvastatin  40 mg Oral q1800  . clopidogrel  75 mg Oral Daily  . enoxaparin (LOVENOX) injection  40 mg Subcutaneous Q24H  . fenofibrate  54 mg Oral Daily  . gabapentin  300 mg  Oral TID  . lisinopril  20 mg Oral Q breakfast   And  . hydrochlorothiazide  12.5 mg Oral Q breakfast  . insulin aspart  0-20 Units Subcutaneous TID WC  . insulin aspart  0-5 Units Subcutaneous QHS  . insulin detemir  75 Units Subcutaneous BID  . metFORMIN  500 mg Oral BID WC  . metoprolol succinate  25 mg Oral Daily   Continuous Infusions:  PRN Meds:.senna-docusate   Antibiotics   Anti-infectives    None        Subjective:   Jaan Filar was seen and examined today. Left-sided weakness improved, wife at the bedside confirms back to baseline speech.  Patient denies dizziness, chest pain, shortness of breath, abdominal pain, N/V/D/C, new weakness, numbess, tingling. No acute events overnight.    Objective:   Filed Vitals:   10/11/15 0300 10/11/15 0500 10/11/15 0700 10/11/15 0924  BP: 150/80 116/78 142/80 141/72  Pulse: 78 72 76 80  Temp: 98.6 F (37  C) 98.6 F (37 C) 98.6 F (37 C) 97.9 F (36.6 C)  TempSrc: Oral Oral Oral Oral  Resp: 18 20  20   Height:      Weight:      SpO2: 100% 98% 100% 98%    Intake/Output Summary (Last 24 hours) at 10/11/15 1106 Last data filed at 10/11/15 0545  Gross per 24 hour  Intake      0 ml  Output    550 ml  Net   -550 ml     Wt Readings from Last 3 Encounters:  10/10/15 108.863 kg (240 lb)  09/26/15 110.678 kg (244 lb)  09/06/15 121.201 kg (267 lb 3.2 oz)     Exam  General: Alert and oriented x 3, NAD  HEENT:  PERRLA, EOMI, Anicteric Sclera, mucous membranes moist.   Neck: Supple, no JVD, no masses  CVS: S1 S2 auscultated, no rubs, murmurs or gallops. Regular rate and rhythm.  Respiratory: Clear to auscultation bilaterally, no wheezing, rales or rhonchi  Abdomen: Soft, nontender, nondistended, + bowel sounds  Ext: no cyanosis clubbing or edema  Neuro: AAOx3, Cr N's II- XII.  mild left UE, LLE 4+/5, right upper and lower extremity 5/5.   Skin: Staples intact on left lower extremity   Psych: Normal affect and demeanor, alert and oriented x3    Data Reviewed:  I have personally reviewed following labs and imaging studies  Micro Results No results found for this or any previous visit (from the past 240 hour(s)).  Radiology Reports Ct Head Wo Contrast  10/10/2015  CLINICAL DATA:  Left arm and left leg weakness and numbness for 2 days, slurred speech EXAM: CT HEAD WITHOUT CONTRAST TECHNIQUE: Contiguous axial images were obtained from the base of the skull through the vertex without intravenous contrast. COMPARISON:  None. FINDINGS: No skull fracture is noted. Paranasal sinuses and mastoid air cells are unremarkable. No intracranial hemorrhage, mass effect or midline shift. No mass lesion is noted on this unenhanced scan. Small lacunar infarcts are noted left basal ganglia and right periventricular adjacent to caudate nucleus. No definite acute cortical infarction. No mass lesion  is noted on this unenhanced scan. Mild cerebral atrophy. Paranasal sinuses and mastoid air cells are unremarkable. Mild atherosclerotic calcifications of carotid siphon. Atherosclerotic calcifications of vertebral arteries. IMPRESSION: Mild cerebral atrophy. Small lacunar infarcts are noted left basal ganglia and right periventricular adjacent to caudate nucleus. No definite acute cortical infarction. No intracranial hemorrhage. If there is high clinical suspicious for acute  infarction further correlation with MRI with diffusion imaging is recommended. Electronically Signed   By: Lahoma Crocker M.D.   On: 10/10/2015 11:48    CBC  Recent Labs Lab 10/10/15 1055  WBC 8.4  HGB 12.4*  HCT 38.0*  PLT 296  MCV 87.4  MCH 28.5  MCHC 32.6  RDW 13.6  LYMPHSABS 2.0  MONOABS 0.7  EOSABS 0.3  BASOSABS 0.0    Chemistries   Recent Labs Lab 10/10/15 1055 10/11/15 0800  NA 135 136  K 4.1 3.8  CL 99* 102  CO2 25 24  GLUCOSE 379* 244*  BUN 20 18  CREATININE 1.02 0.82  CALCIUM 9.4 8.9  AST 22  --   ALT 18  --   ALKPHOS 69  --   BILITOT 0.7  --    ------------------------------------------------------------------------------------------------------------------ estimated creatinine clearance is 118.7 mL/min (by C-G formula based on Cr of 0.82). ------------------------------------------------------------------------------------------------------------------ No results for input(s): HGBA1C in the last 72 hours. ------------------------------------------------------------------------------------------------------------------  Recent Labs  10/11/15 0440  CHOL 392*  HDL NOT REPORTED DUE TO HIGH TRIGLYCERIDES  LDLCALC UNABLE TO CALCULATE IF TRIGLYCERIDE OVER 400 mg/dL  TRIG 1408*  CHOLHDL NOT REPORTED DUE TO HIGH TRIGLYCERIDES   ------------------------------------------------------------------------------------------------------------------ No results for input(s): TSH, T4TOTAL, T3FREE,  THYROIDAB in the last 72 hours.  Invalid input(s): FREET3 ------------------------------------------------------------------------------------------------------------------ No results for input(s): VITAMINB12, FOLATE, FERRITIN, TIBC, IRON, RETICCTPCT in the last 72 hours.  Coagulation profile  Recent Labs Lab 10/10/15 1055  INR 0.90    No results for input(s): DDIMER in the last 72 hours.  Cardiac Enzymes  Recent Labs Lab 10/10/15 1055  TROPONINI <0.03   ------------------------------------------------------------------------------------------------------------------ Invalid input(s): POCBNP   Recent Labs  10/10/15 1424 10/10/15 1557 10/10/15 1909 10/10/15 2259 10/11/15 0645  GLUCAP 284* 227* 297* 60* 215*     RAI,RIPUDEEP M.D. [facility] 10/11/2015, 11:06 AM  Pager: [phone] Between 7am to 7pm - call Pager - [phone]  After 7pm go to [email or web address] - password [email or web address]  Call night coverage person covering after 7pm
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
## 2015-10-12 ENCOUNTER — Other Ambulatory Visit: Payer: Self-pay | Admitting: *Deleted

## 2015-10-12 DIAGNOSIS — Z48812 Encounter for surgical aftercare following surgery on the circulatory system: Secondary | ICD-10-CM

## 2015-10-12 DIAGNOSIS — E0865 Diabetes mellitus due to underlying condition with hyperglycemia: Secondary | ICD-10-CM

## 2015-10-12 DIAGNOSIS — I639 Cerebral infarction, unspecified: Principal | ICD-10-CM

## 2015-10-12 DIAGNOSIS — Z794 Long term (current) use of insulin: Secondary | ICD-10-CM

## 2015-10-12 DIAGNOSIS — E1165 Type 2 diabetes mellitus with hyperglycemia: Secondary | ICD-10-CM

## 2015-10-12 DIAGNOSIS — I739 Peripheral vascular disease, unspecified: Secondary | ICD-10-CM

## 2015-10-12 DIAGNOSIS — E781 Pure hyperglyceridemia: Secondary | ICD-10-CM

## 2015-10-12 LAB — GLUCOSE, CAPILLARY
GLUCOSE-CAPILLARY: 193 mg/dL — AB (ref 65–99)
Glucose-Capillary: 153 mg/dL — ABNORMAL HIGH (ref 65–99)

## 2015-10-12 LAB — BASIC METABOLIC PANEL
Anion gap: 10 (ref 5–15)
BUN: 17 mg/dL (ref 6–20)
CHLORIDE: 104 mmol/L (ref 101–111)
CO2: 23 mmol/L (ref 22–32)
CREATININE: 0.76 mg/dL (ref 0.61–1.24)
Calcium: 8.9 mg/dL (ref 8.9–10.3)
GFR calc non Af Amer: >60 mL/min (ref >60)
Glucose, Bld: 179 mg/dL — ABNORMAL HIGH (ref 65–99)
POTASSIUM: 4 mmol/L (ref 3.5–5.1)
Sodium: 137 mmol/L (ref 135–145)

## 2015-10-12 LAB — HEMOGLOBIN A1C
Hgb A1c MFr Bld: 10.3 % — ABNORMAL HIGH (ref 4.8–5.6)
MEAN PLASMA GLUCOSE: 249 mg/dL

## 2015-10-12 MED ORDER — INSULIN ASPART 100 UNIT/ML ~~LOC~~ SOLN: 40.0000 [IU] | Freq: Three times a day (TID) | SUBCUTANEOUS | Status: DC

## 2015-10-12 MED ORDER — LEVEMIR FLEXTOUCH 100 UNIT/ML ~~LOC~~ SOPN: 80.0000 [IU] | PEN_INJECTOR | Freq: Two times a day (BID) | SUBCUTANEOUS | Status: DC

## 2015-10-12 MED ORDER — ATORVASTATIN CALCIUM 80 MG PO TABS: 80.0000 mg | ORAL_TABLET | Freq: Every day | ORAL | Status: DC

## 2015-10-12 NOTE — Progress Notes (Signed)
Patient in room with family at his bedside. He denies having any pain this morning, has no requests this morning-Will continue to monitor.

## 2015-10-12 NOTE — Discharge Summary (Signed)
Physician Discharge Summary  [name] PVV:[id number] DOB: [birth year] DOA: 10/10/2015  PCP: Patient does not have a PCP Admit date: 10/10/2015 Discharge date: 10/12/2015  Time spent: 35 minutes  Recommendations for Outpatient Follow-up:  1. Discharge home with outpatient follow-up with endocrinologist  2. Referral made to outpatient neurology.   Discharge Diagnoses:  Principal Problem:   Acute ischemic stroke [facility])   Active Problems:   PVD (peripheral vascular disease) ([place])   Essential hypertension   Coronary artery disease, history of CABG   Diabetes type 2, controlled ([place])   CVA (cerebral infarction)   Hypertriglyceridemia   Uncontrolled diabetes mellitus with hyperglycemia, with long-term current use of insulin ([place])   Discharge Condition: Fair  Diet recommendation: Heart healthy/diabetic  CODE STATUS: Full code  Filed Weights   10/10/15 1029  Weight: 108.863 kg (240 lb)    History of present illness:  Please refer to admission H&P for details, in brief, 60 year old male with history of uncontrolled diabetes mellitus on insulin, hypertension, peripheral vascular disease, significant hypertriglyceridemia ,CAD status post CABG, PVD with recent left posterior tibial bypass 5 weeks back presented to the ED with left-sided weakness and slurred speech. Symptoms started 2 days back. Head CT in the ED was unremarkable. Blood work unremarkable except for UA showing glucosuria. Symptoms started with left leg weakness followed by weakness in his left arm. His family noticed that his speech was slurred. Patient was unable to walk or lift things up with his left hand. The symptoms persisted next day so he came to the ED. Patient was out of the therapeutic window for IV tPA showed he did not receive it. Patient admitted to hospital service and neurology consulted. MRI of the brain showed an acute lacunar infarct in the right pons with no hemorrhage or mass effect. Occlusion of the  distal right vertebral artery proximal to PICA seen. Moderately advanced chronic small vessel disease primarily in the bilateral basal ganglia and cerebral white matter also seen.    Hospital Course:  Acute ischemic stroke Acute lacunar infarct in the right pons. Symptoms resolved by the time he came to the hospital. Patient already on full dose aspirin and Plavix at home which was continued. 2-D echo showed normal EF without cardiac source of emboli. Carotid Doppler without significant stenosis. Lipid panel showed triglycerides of 1400 and unable to calculate LDL. A1c of 10.3. -Seen by PT/OT and no further recommendations. -Seen by stroke team this afternoon and recommended to continue aspirin and Plavix. Patient is on Zocor 80 mg daily which I will switch to Lipitor 80 mg daily. -Counseled on diet and medication adherence. Needs aggressive stroke risk  reduction. -Outpatient referral to neurology met.  Dyslipidemia with severe hypertriglyceridemia Continue fibrate. Zocor switched to Lipitor. His cardiologist Dr. Gwenlyn Found has referred him to a specialist at [facility] for his dyslipidemia. Reports having an appointment next week.  Uncontrolled diabetes mellitus type 2 One long-acting insulin and pre-meal coverage. Also on metformin. A1c of 10.3. I have increased his Levemir to 75 units twice a day and pre-meal aspart to 40 units 3 times a day. Continue Neurontin for peripheral neuropathy.  Coronary artery disease/A. fib vascular disease Continue aspirin and Plavix and statin. Continue beta blocker . Recent left posterior tibial bypass. Vascular surgery had removed staples during this hospitalization and will follow up with him as outpatient.  Essential hypertension   continue lisinopril and HCTZ.   Procedures: MRI brain/MRA head 2-D echo Carotid Doppler   Consultations:  Neurology  Vascular surgery  Discharge Exam: Filed Vitals:   10/12/15 0934 10/12/15 1355  BP: 137/71 138/64   Pulse: 78 84  Temp:  98.6 F (37 C)  Resp: 20 20    General: Middle aged male not in distress HEENT: No pallor, moist mucosa Chest: Clear bilaterally, no added sounds CVS: Normal S1 and S2, no murmurs rub or gallop GI: Soft, nondistended, nontender, bowel sounds present Musculoskeletal: Warm, no edema CNS: Alert and oriented, nonfocal   Discharge Instructions   Discharge Instructions    Ambulatory referral to Neurology    Complete by:  As directed   An appointment is requested in approximately: 8 weeks          Current Discharge Medication List    START taking these medications   Details  atorvastatin (LIPITOR) 80 MG tablet Take 1 tablet (80 mg total) by mouth daily. Qty: 30 tablet, Refills: 0      CONTINUE these medications which have CHANGED   Details  insulin aspart (NOVOLOG) 100 UNIT/ML injection Inject 40 Units into the skin 3 (three) times daily before meals. Qty: 10 mL, Refills: 11    LEVEMIR FLEXTOUCH 100 UNIT/ML Pen Inject 80 Units into the skin 2 (two) times daily. Reported on 08/21/2015 Qty: 15 mL, Refills: 4      CONTINUE these medications which have NOT CHANGED   Details  aspirin 325 MG tablet Take 325 mg by mouth daily with breakfast.     clopidogrel (PLAVIX) 75 MG tablet TAKE 1 TABLET BY MOUTH EVERY DAY Qty: 90 tablet, Refills: 3    fenofibrate (TRICOR) 145 MG tablet Take 1 tablet (145 mg total) by mouth daily. Qty: 90 tablet, Refills: 3    gabapentin (NEURONTIN) 300 MG capsule Take 300 mg by mouth 3 (three) times daily.     HYDROcodone-acetaminophen (NORCO/VICODIN) 5-325 MG tablet Take 1 tablet by mouth every 4 (four) hours as needed. Qty: 50 tablet, Refills: 0   Associated Diagnoses: Atherosclerosis of native artery of left lower extremity with rest pain (HCC)    lisinopril-hydrochlorothiazide (PRINZIDE,ZESTORETIC) 20-12.5 MG per tablet Take 1 tablet by mouth daily with breakfast.     metFORMIN (GLUCOPHAGE-XR) 500 MG 24 hr tablet Take 500  mg by mouth 2 (two) times daily.    metoprolol succinate (TOPROL-XL) 25 MG 24 hr tablet Take 1 tablet (25 mg total) by mouth daily. Qty: 30 tablet, Refills: 6    oxyCODONE-acetaminophen (PERCOCET/ROXICET) 5-325 MG tablet Take 1-2 tablets by mouth every 4 (four) hours as needed for moderate pain. Qty: 30 tablet, Refills: 0      STOP taking these medications     simvastatin (ZOCOR) 80 MG tablet        No Known Allergies Follow-up Information    Please follow up.   Why:  referral to neurology in 8 weeks       The results of significant diagnostics from this hospitalization (including imaging, microbiology, ancillary and laboratory) are listed below for reference.    Significant Diagnostic Studies: Dg Chest 2 View  10/11/2015  CLINICAL DATA:  Patient had a stroke. EXAM: CHEST  2 VIEW COMPARISON:  02/26/2015. FINDINGS: The heart size and mediastinal contours are within normal limits. Prior median sternotomy for CABG. Both lungs are clear. The visualized skeletal structures are unremarkable. IMPRESSION: No active cardiopulmonary disease.  Stable appearance from priors. Electronically Signed   By: Staci Righter M.D.   On: 10/11/2015 21:29   Ct Head Wo Contrast  10/10/2015  CLINICAL DATA:  Left arm and left leg weakness and numbness for 2 days, slurred speech EXAM: CT HEAD WITHOUT CONTRAST TECHNIQUE: Contiguous axial images were obtained from the base of the skull through the vertex without intravenous contrast. COMPARISON:  None. FINDINGS: No skull fracture is noted. Paranasal sinuses and mastoid air cells are unremarkable. No intracranial hemorrhage, mass effect or midline shift. No mass lesion is noted on this unenhanced scan. Small lacunar infarcts are noted left basal ganglia and right periventricular adjacent to caudate nucleus. No definite acute cortical infarction. No mass lesion is noted on this unenhanced scan. Mild cerebral atrophy. Paranasal sinuses and mastoid air cells are  unremarkable. Mild atherosclerotic calcifications of carotid siphon. Atherosclerotic calcifications of vertebral arteries. IMPRESSION: Mild cerebral atrophy. Small lacunar infarcts are noted left basal ganglia and right periventricular adjacent to caudate nucleus. No definite acute cortical infarction. No intracranial hemorrhage. If there is high clinical suspicious for acute infarction further correlation with MRI with diffusion imaging is recommended. Electronically Signed   By: Lahoma Crocker M.D.   On: 10/10/2015 11:48   Mr Brain Wo Contrast  10/11/2015  ADDENDUM REPORT: 10/11/2015 21:22 ADDENDUM: Study discussed by telephone with Provider M. Donnal Debar (covering for Provider Roney Jaffe ) on 10/11/2015 at 2115 hours. Electronically Signed   By: Genevie Ann M.D.   On: 10/11/2015 21:22  10/11/2015  CLINICAL DATA:  60 year old male with left side weakness and slurred speech for 2 days. Initial encounter. EXAM: MRI HEAD WITHOUT CONTRAST MRA HEAD WITHOUT CONTRAST TECHNIQUE: Multiplanar, multiecho pulse sequences of the brain and surrounding structures were obtained without intravenous contrast. Angiographic images of the head were obtained using MRA technique without contrast. COMPARISON:  Head CT without contrast 10/10/2015. FINDINGS: MRI HEAD FINDINGS Patchy and nodular restricted diffusion along the ventral surface of the right pons (series 4, image 13). Associated mild T2 and FLAIR hyperintensity with no mass effect or definite hemorrhage. No other restricted diffusion. Loss of the distal right vertebral artery flow void proximal to the right PICA. See below. Other Major intracranial vascular flow voids are preserved. Chronic lacunar infarcts in the bilateral caudate nuclei. Additional T2 hyperintensity in the bilateral lentiform nuclei may simply be perivascular spaces. Scattered bilateral cerebral white matter T2 and FLAIR hyperintensity which for the most part most resembles chronic lacunar infarcts. No cortical  encephalomalacia. Chronic micro hemorrhage near the posterior limb of the left internal capsule. No other chronic cerebral blood products. No midline shift, mass effect, evidence of mass lesion, ventriculomegaly, extra-axial collection or acute intracranial hemorrhage. Cervicomedullary junction and pituitary are within normal limits. Negative visualized cervical spine. Visible internal auditory structures appear normal. Mastoids are clear. Trace paranasal sinus mucosal thickening. Negative orbit and scalp soft tissues. Normal bone marrow signal. MRA HEAD FINDINGS Loss of flow signal in the distal right vertebral artery aside from minimal probably retrograde flow from the vertebrobasilar junction to the right PICA. Asymmetrically decreased right PICA flow. Preserved distal left vertebral artery flow. Preserved basilar artery flow, but with moderate to severe irregularity and stenosis of the distal basilar artery just proximal to the SCA origins (series 5, image 101). Both SCA and PCA origins remain within normal limits. Posterior communicating arteries are diminutive or absent. Bilateral PCA branches are within normal limits. Antegrade flow in both ICA siphons with mild tortuosity and irregularity, no siphon stenosis. Normal ophthalmic artery origins. Patent carotid termini. MCA and ACA origins are within normal limits. Anterior communicating artery and visualized bilateral ACA branches are within normal limits. Mild bilateral MCA  M1 segment irregularity. Both MCA bifurcations/trifurcations are patent and visualized bilateral MCA branches are within normal limits. IMPRESSION: 1. Acute lacunar infarct in the right pons with no hemorrhage or mass effect. 2. Occlusion of the distal right vertebral artery proximal to PICA, suspicious for associated acute emergent large vessel occlusion. 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 3. Comparatively mild anterior circulation atherosclerosis  with no significant stenosis. 4. Underlying moderately advanced chronic small vessel disease primarily in the bilateral basal ganglia and cerebral white matter. Electronically Signed: By: Genevie Ann M.D. On: 10/11/2015 20:29   [name] Head/brain Wo Cm  10/11/2015  ADDENDUM REPORT: 10/11/2015 21:22 ADDENDUM: Study discussed by telephone with Provider M. Donnal Debar (covering for Provider Roney Jaffe ) on 10/11/2015 at 2115 hours. Electronically Signed   By: Genevie Ann M.D.   On: 10/11/2015 21:22  10/11/2015  CLINICAL DATA:  60 year old male with left side weakness and slurred speech for 2 days. Initial encounter. EXAM: MRI HEAD WITHOUT CONTRAST MRA HEAD WITHOUT CONTRAST TECHNIQUE: Multiplanar, multiecho pulse sequences of the brain and surrounding structures were obtained without intravenous contrast. Angiographic images of the head were obtained using MRA technique without contrast. COMPARISON:  Head CT without contrast 10/10/2015. FINDINGS: MRI HEAD FINDINGS Patchy and nodular restricted diffusion along the ventral surface of the right pons (series 4, image 13). Associated mild T2 and FLAIR hyperintensity with no mass effect or definite hemorrhage. No other restricted diffusion. Loss of the distal right vertebral artery flow void proximal to the right PICA. See below. Other Major intracranial vascular flow voids are preserved. Chronic lacunar infarcts in the bilateral caudate nuclei. Additional T2 hyperintensity in the bilateral lentiform nuclei may simply be perivascular spaces. Scattered bilateral cerebral white matter T2 and FLAIR hyperintensity which for the most part most resembles chronic lacunar infarcts. No cortical encephalomalacia. Chronic micro hemorrhage near the posterior limb of the left internal capsule. No other chronic cerebral blood products. No midline shift, mass effect, evidence of mass lesion, ventriculomegaly, extra-axial collection or acute intracranial hemorrhage. Cervicomedullary junction and  pituitary are within normal limits. Negative visualized cervical spine. Visible internal auditory structures appear normal. Mastoids are clear. Trace paranasal sinus mucosal thickening. Negative orbit and scalp soft tissues. Normal bone marrow signal. MRA HEAD FINDINGS Loss of flow signal in the distal right vertebral artery aside from minimal probably retrograde flow from the vertebrobasilar junction to the right PICA. Asymmetrically decreased right PICA flow. Preserved distal left vertebral artery flow. Preserved basilar artery flow, but with moderate to severe irregularity and stenosis of the distal basilar artery just proximal to the SCA origins (series 5, image 101). Both SCA and PCA origins remain within normal limits. Posterior communicating arteries are diminutive or absent. Bilateral PCA branches are within normal limits. Antegrade flow in both ICA siphons with mild tortuosity and irregularity, no siphon stenosis. Normal ophthalmic artery origins. Patent carotid termini. MCA and ACA origins are within normal limits. Anterior communicating artery and visualized bilateral ACA branches are within normal limits. Mild bilateral MCA M1 segment irregularity. Both MCA bifurcations/trifurcations are patent and visualized bilateral MCA branches are within normal limits. IMPRESSION: 1. Acute lacunar infarct in the right pons with no hemorrhage or mass effect. 2. Occlusion of the distal right vertebral artery proximal to PICA, suspicious for associated acute emergent large vessel occlusion. 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 3. Comparatively mild anterior circulation atherosclerosis with no significant stenosis. 4. Underlying moderately advanced chronic  small vessel disease primarily in the bilateral basal ganglia and cerebral white matter. Electronically Signed: By: Genevie Ann M.D. On: 10/11/2015 20:29    Microbiology: No results found for this or any previous visit (from the  past 240 hour(s)).   Labs: Basic Metabolic Panel:  Recent Labs Lab 10/10/15 1055 10/11/15 0800 10/12/15 0621  NA 135 136 137  K 4.1 3.8 4.0  CL 99* 102 104  CO2 25 24 23   GLUCOSE 379* 244* 179*  BUN 20 18 17   CREATININE 1.02 0.82 0.76  CALCIUM 9.4 8.9 8.9   Liver Function Tests:  Recent Labs Lab 10/10/15 1055  AST 22  ALT 18  ALKPHOS 69  BILITOT 0.7  PROT 6.8  ALBUMIN 3.4*   No results for input(s): LIPASE, AMYLASE in the last 168 hours. No results for input(s): AMMONIA in the last 168 hours. CBC:  Recent Labs Lab 10/10/15 1055  WBC 8.4  NEUTROABS 5.3  HGB 12.4*  HCT 38.0*  MCV 87.4  PLT 296   Cardiac Enzymes:  Recent Labs Lab 10/10/15 1055  TROPONINI <0.03   BNP: BNP (last 3 results) No results for input(s): BNP in the last 8760 hours.  ProBNP (last 3 results) No results for input(s): PROBNP in the last 8760 hours.  CBG:  Recent Labs Lab 10/11/15 1140 10/11/15 1603 10/11/15 2142 10/12/15 0632 10/12/15 1137  GLUCAP 213* 182* 186* 153* 193*       Signed:  Louellen Molder MD.  [facility] 10/12/2015, 4:16 PM

## 2015-10-12 NOTE — Progress Notes (Addendum)
Patient is educated and encouraged on the importance of adhering to reccommended diet--> stated he's trying

## 2015-10-12 NOTE — Progress Notes (Signed)
[facility]  Subjective  - feels ok   Objective 117/66 85 97.9 F (36.6 C) (Oral) 20 99%  Intake/Output Summary (Last 24 hours) at 10/12/15 0825 Last data filed at 10/11/15 1332  Gross per 24 hour  Intake    720 ml  Output      0 ml  Net    720 ml   Staples out yesterday Assessment/Planning: No significant carotid stenosis We will arrange to see him in follow up in 3 months  Ruta Hinds 10/12/2015 8:25 AM --  Laboratory Lab Results:  Recent Labs  10/10/15 1055  WBC 8.4  HGB 12.4*  HCT 38.0*  PLT 296   BMET  Recent Labs  10/11/15 0800 10/12/15 0621  NA 136 137  K 3.8 4.0  CL 102 104  CO2 24 23  GLUCOSE 244* 179*  BUN 18 17  CREATININE 0.82 0.76  CALCIUM 8.9 8.9    COAG Lab Results  Component Value Date   INR 0.90 10/10/2015   INR 0.95 09/04/2015   INR 0.89 03/22/2015   No results found for: PTT

## 2015-10-12 NOTE — Progress Notes (Addendum)
STROKE TEAM PROGRESS NOTE   HISTORY OF PRESENT ILLNESS AT [facility] TIME OF ADMISSION [name] is an 60 y.o. male with hx DM2, HTN, PVD and CAD s/p CABG , presented to ED this am with left sided weakness and slurred speech.Two days ago on Tuesday night he felt weakness in the left leg. A while later his left arm felt weak also. His family was telling him that his speech was slurred.He also noted he could not hold objects in his left hand and when he went to pick up stuff he was clumsy. Head CT in ED was negative for anything acute but did show small lacunar infarcts are noted left basal ganglia and right periventricular adjacent to caudate nucleus. Currently he feels the symptoms have resolved. He states he takes his ASA and Plavix daily.   Date last known well: 3.21.2017 Time last known well: Unable to determine tPA Given: No: out of window    SUBJECTIVE (INTERVAL HISTORY) His significant other is at the bedside.  Overall he feels his condition is rapidly improving. He reports improvement in slurred speech, left arm is 100% and left left is 90%.  He explained he challenges with compliance and stated understanding of the importance.  He was once referred to a [facility] specialist for the >>>>TGs, but did not go.   OBJECTIVE Temp:  [97.7 F (36.5 C)-97.9 F (36.6 C)] 97.9 F (36.6 C) (03/25 0509) Pulse Rate:  [80-91] 85 (03/25 0509) Cardiac Rhythm:  [-] Heart block (03/24 1913) Resp:  [20] 20 (03/25 0509) BP: (117-156)/(64-88) 117/66 mmHg (03/25 0509) SpO2:  [98 %-100 %] 99 % (03/25 0509)  CBC:  Recent Labs Lab 10/10/15 1055  WBC 8.4  NEUTROABS 5.3  HGB 12.4*  HCT 38.0*  MCV 87.4  PLT 0000000    Basic Metabolic Panel:  Recent Labs Lab 10/11/15 0800 10/12/15 0621  NA 136 137  K 3.8 4.0  CL 102 104  CO2 24 23  GLUCOSE 244* 179*  BUN 18 17  CREATININE 0.82 0.76  CALCIUM 8.9 8.9    Lipid Panel:    Component Value Date/Time   CHOL 392* 10/11/2015 0440   TRIG 1408* 10/11/2015  0440   HDL NOT REPORTED DUE TO HIGH TRIGLYCERIDES 10/11/2015 0440   CHOLHDL NOT REPORTED DUE TO HIGH TRIGLYCERIDES 10/11/2015 0440   VLDL UNABLE TO CALCULATE IF TRIGLYCERIDE OVER 400 mg/dL 10/11/2015 0440   LDLCALC UNABLE TO CALCULATE IF TRIGLYCERIDE OVER 400 mg/dL 10/11/2015 0440   HgbA1c:  Lab Results  Component Value Date   HGBA1C 10.3* 10/11/2015   Urine Drug Screen:    Component Value Date/Time   LABOPIA NONE DETECTED 10/10/2015 1140   COCAINSCRNUR NONE DETECTED 10/10/2015 1140   LABBENZ NONE DETECTED 10/10/2015 1140   AMPHETMU NONE DETECTED 10/10/2015 1140   THCU NONE DETECTED 10/10/2015 1140   LABBARB NONE DETECTED 10/10/2015 1140      IMAGING  Dg Chest 2 View 10/11/2015   No active cardiopulmonary disease.  Stable appearance from priors.   Ct Head Wo Contrast 10/10/2015   Mild cerebral atrophy. Small lacunar infarcts are noted left basal ganglia and right periventricular adjacent to caudate nucleus. No definite acute cortical infarction. No intracranial hemorrhage. If there is high clinical suspicious for acute infarction further correlation with MRI with diffusion imaging is recommended.   [name] Head/brain Wo Cm 10/11/2015  1. Acute lacunar infarct in the right pons with no hemorrhage or mass effect.  2. 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  3. Comparatively mild anterior circulation atherosclerosis with no significant stenosis.  4. Underlying moderately advanced chronic small vessel disease primarily in the bilateral basal ganglia and cerebral white matter.   Physical Exam General - Well nourished, well developed, in NAD   Cardiovascular - Regular rate and rhythm Pulmonary: CTA Abdomen: NT, ND, normal bowel sounds Extremities: No C/C/E; well healed and healing incisions on both arms and left  LE  Neurological Exam Mental Status: Normal Orientation:  Oriented to person, place and time Speech:  Fluent; slight dysarthria Cranial Nerves:  PERRL; EOMI; visual fields full, face grossly symmetric, hearing grossly intact; shrug symmetric and tongue midline  Motor Exam:  Tone:  Within normal limits; Strength: 5/5 throughout; there is minor drift in the Left UE  Sensory: Intact to light touch throughout  Coordination:  Intact finger to nose  Gait: slightly wide based but steady  ASSESSMENT/PLAN [name] is a 60 y.o. male with history of hyperlipidemia, coronary artery disease, peripheral vascular disease, hypertension, previous MI, previous DVT, and diabetes mellitus,  presenting with left hemiparesis and slurred speech.. He did not receive IV t-PA due to late presentation.  Stroke:  Non-dominant infarct secondary to small vessel disease.  Resultant  Slurred speech, left-sided  MRI  Acute lacunar infarct in the right pons with no hemorrhage or mass effect.   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 antegrade vertebral flow.  2D Echo  EF 55-60%. No cardiac source of emboli identified.  LDL not calculated secondary to elevated triglycerides of 1408  HgbA1c 10.3  VTE prophylaxis - Lovenox  Diet heart healthy/carb modified Room service appropriate?: Yes; Fluid consistency:: Thin  aspirin 325 mg daily and clopidogrel 75 mg daily prio to adm., now on aspirin 325 mg daily and clopidogrel 75 mg daily  Patient counseled to be compliant with his antithrombotic medications  Ongoing aggressive stroke risk factor management   Therapy recommendations: no follow-up physical therapy  Disposition: pending  Hypertension  Stable  Permissive hypertension (OK if < 220/120) but gradually normalize in 5-7 days  Hyperlipidemia  Home meds:   fenofibrate 145 mg daily  prior to admission  LDL unable to calculate, goal < 70  Now on Lipitor 40 mg daily and fenofibrate 24 mg daily  Continue statin at discharge  Diabetes  HgbA1c 10.3, goal < 7.0  Uncontrolled   Other Stroke Risk Factors  Advanced age  Obesity, Body mass index is 33.49 kg/(m^2).   Coronary artery disease   Hospital day # 2  ATTENDING NOTE: Patient was seen and examined by me personally. Documentation reflects findings. The laboratory and radiographic studies reviewed by me. ROS completed by me personally and pertinent positives fully documented.  No new complaints  Condition:  Stable for discharge  Assessment and plan completed by me personally and fully documented above. Plans/Recommendations include:     Please discharge with clear follow-up instructions for lipid and diabetes management  May wish to increase Lipitor for aggressive management and encourage compliance with referral to specialist  Request RN to provide nutrition education  I explained the dangers of brainstem strokes in detail; and risk factors in details   SIGNED BY: Dr. Elissa Hefty     To contact Stroke Continuity provider, please  refer to [email or web address] After hours, contact General Neurology

## 2015-10-12 NOTE — Discharge Instructions (Signed)
Stroke Prevention Some medical conditions and behaviors are associated with an increased chance of having a stroke. You may prevent a stroke by making healthy choices and managing medical conditions. HOW CAN I REDUCE MY RISK OF HAVING A STROKE?   Stay physically active. Get at least 30 minutes of activity on most or all days.  Do not smoke. It may also be helpful to avoid exposure to secondhand smoke.  Limit alcohol use. Moderate alcohol use is considered to be:  No more than 2 drinks per day for men.  No more than 1 drink per day for nonpregnant women.  Eat healthy foods. This involves:  Eating 5 or more servings of fruits and vegetables a day.  Making dietary changes that address high blood pressure (hypertension), high cholesterol, diabetes, or obesity.  Manage your cholesterol levels.  Making food choices that are high in fiber and low in saturated fat, trans fat, and cholesterol may control cholesterol levels.  Take any prescribed medicines to control cholesterol as directed by your health care provider.  Manage your diabetes.  Controlling your carbohydrate and sugar intake is recommended to manage diabetes.  Take any prescribed medicines to control diabetes as directed by your health care provider.  Control your hypertension.  Making food choices that are low in salt (sodium), saturated fat, trans fat, and cholesterol is recommended to manage hypertension.  Ask your health care provider if you need treatment to lower your blood pressure. Take any prescribed medicines to control hypertension as directed by your health care provider.  If you are 18-39 years of age, have your blood pressure checked every 3-5 years. If you are 40 years of age or older, have your blood pressure checked every year.  Maintain a healthy weight.  Reducing calorie intake and making food choices that are low in sodium, saturated fat, trans fat, and cholesterol are recommended to manage  weight.  Stop drug abuse.  Avoid taking birth control pills.  Talk to your health care provider about the risks of taking birth control pills if you are over 35 years old, smoke, get migraines, or have ever had a blood clot.  Get evaluated for sleep disorders (sleep apnea).  Talk to your health care provider about getting a sleep evaluation if you snore a lot or have excessive sleepiness.  Take medicines only as directed by your health care provider.  For some people, aspirin or blood thinners (anticoagulants) are helpful in reducing the risk of forming abnormal blood clots that can lead to stroke. If you have the irregular heart rhythm of atrial fibrillation, you should be on a blood thinner unless there is a good reason you cannot take them.  Understand all your medicine instructions.  Make sure that other conditions (such as anemia or atherosclerosis) are addressed. SEEK IMMEDIATE MEDICAL CARE IF:   You have sudden weakness or numbness of the face, arm, or leg, especially on one side of the body.  Your face or eyelid droops to one side.  You have sudden confusion.  You have trouble speaking (aphasia) or understanding.  You have sudden trouble seeing in one or both eyes.  You have sudden trouble walking.  You have dizziness.  You have a loss of balance or coordination.  You have a sudden, severe headache with no known cause.  You have new chest pain or an irregular heartbeat. Any of these symptoms may represent a serious problem that is an emergency. Do not wait to see if the symptoms will   go away. Get medical help at once. Call your local emergency services (911 in [place] Do not drive yourself to the hospital.   This information is not intended to replace advice given to you by your health care provider. Make sure you discuss any questions you have with your health care provider.   Document Released: 08/13/2004 Document Revised: 07/27/2014 Document Reviewed:  01/06/2013 Elsevier Interactive Patient Education 2016 [employer]  

## 2015-10-12 NOTE — Progress Notes (Signed)
D./c instructions reviewed with patient. He assisted downstairs with wheelchair downstairs to be picked up by spouse.

## 2015-10-16 ENCOUNTER — Telehealth: Payer: Self-pay

## 2015-10-16 ENCOUNTER — Other Ambulatory Visit: Payer: Self-pay | Admitting: Physical Medicine and Rehabilitation

## 2015-10-16 ENCOUNTER — Telehealth: Payer: Self-pay | Admitting: Vascular Surgery

## 2015-10-16 DIAGNOSIS — M545 Low back pain: Secondary | ICD-10-CM

## 2015-10-16 NOTE — Telephone Encounter (Signed)
Requesting surgical clearance:   1. Type of surgery: Spinal Epidural Steroid Injection  2. Surgeon: Dr. Ernestina Patches   3. Surgical date: pending clearance  4. Medications that need to be held: Plavix - 7days before  5. CAD: Yes     6. I will defer to: Berry/PharmD  ** Pt had vascular surgery with Dr. Oneida Alar, [facility] in 08/2015 and PV angio with stents in 03/2015.    Ulyess Mort; Dr. Rolla Flatten Scheduling [phone] fax [phone] phone

## 2015-10-16 NOTE — Telephone Encounter (Signed)
Spoke with pt, dpm °

## 2015-10-16 NOTE — Telephone Encounter (Signed)
-----   Message from Mena Goes, RN sent at 10/12/2015  3:31 PM EDT ----- Regarding: schedule   ----- Message -----    From: Elam Dutch, MD    Sent: 10/12/2015   8:26 AM      To: [facility] Charge Pool  Needs follow up appt in 3 months with bilateral ABI and graft duplex  Ruta Hinds

## 2015-10-20 NOTE — Telephone Encounter (Signed)
OK to interrupt plavix for spinal injection. Stop at least 7 days prior to the injection.  JJB

## 2015-10-23 ENCOUNTER — Telehealth: Payer: Self-pay

## 2015-10-23 ENCOUNTER — Ambulatory Visit
Admission: RE | Admit: 2015-10-23 | Discharge: 2015-10-23 | Disposition: A | Discharge status: Home / Self Care | Payer: BLUE CROSS/BLUE SHIELD | Source: Ambulatory Visit | Attending: Physical Medicine and Rehabilitation | Admitting: Physical Medicine and Rehabilitation

## 2015-10-23 DIAGNOSIS — M545 Low back pain: Secondary | ICD-10-CM

## 2015-10-23 NOTE — Telephone Encounter (Signed)
Attempted to return call to pt. In response to voice message left on Triage phone with report of sore on his leg; left voice message for pt. to call back to the office.

## 2015-10-23 NOTE — Telephone Encounter (Signed)
Fax sent to [facility].

## 2015-10-24 ENCOUNTER — Encounter: Payer: Self-pay | Admitting: Vascular Surgery

## 2015-10-24 ENCOUNTER — Encounter: Payer: Self-pay | Admitting: *Deleted

## 2015-10-24 ENCOUNTER — Ambulatory Visit (INDEPENDENT_AMBULATORY_CARE_PROVIDER_SITE_OTHER): Payer: Self-pay | Admitting: Vascular Surgery

## 2015-10-24 VITALS — BP 140/74 | HR 84 | Temp 98.5°F | Resp 16 | Ht 71.0 in | Wt 242.0 lb

## 2015-10-24 DIAGNOSIS — R269 Unspecified abnormalities of gait and mobility: Secondary | ICD-10-CM

## 2015-10-24 DIAGNOSIS — I6930 Unspecified sequelae of cerebral infarction: Secondary | ICD-10-CM

## 2015-10-24 NOTE — Progress Notes (Signed)
Patient name: [name]    MRN: [id number]        DOB: [birth year]         Sex: male  REASON FOR VISIT: Postoperative follow-up   HPI: [name] is a 60 y.o. male who presents for follow-up status post left superficial femoral to posterior tibial artery bypass using composite non-reversed right upper extremity vein and PTFE on 09/06/2015. This was performed for rest pain. Today, the patient states that he has rest pain has completely resolved. The patient is doing well without any other complaints. He is not smoking.  He recently developed several ulcers posterior to his distal incision. This was after an event or his leg swelled up significantly. He also had a recent stroke which involved his left side. Carotid duplex workup was negative.     Current Outpatient Prescriptions   Medication  Sig  Dispense  Refill   .  aspirin 325 MG tablet  Take 325 mg by mouth daily with breakfast.        .  clopidogrel (PLAVIX) 75 MG tablet  TAKE 1 TABLET BY MOUTH EVERY DAY  90 tablet  3   .  fenofibrate (TRICOR) 145 MG tablet  Take 1 tablet (145 mg total) by mouth daily.  90 tablet  3   .  gabapentin (NEURONTIN) 300 MG capsule  Take 300 mg by mouth 3 (three) times daily.        [name]  HYDROcodone-acetaminophen (NORCO/VICODIN) 5-325 MG tablet  Take 1 tablet by mouth every 4 (four) hours as needed.  50 tablet  0   .  insulin aspart (NOVOLOG) 100 UNIT/ML injection  Inject 35 Units into the skin 3 (three) times daily before meals.        [name]  LEVEMIR FLEXTOUCH 100 UNIT/ML Pen  Inject 75 Units into the skin 2 (two) times daily. Reported on 08/21/2015    4   .  lisinopril-hydrochlorothiazide (PRINZIDE,ZESTORETIC) 20-12.5 MG per tablet  Take 1 tablet by mouth daily with breakfast.        .  metFORMIN (GLUCOPHAGE-XR) 500 MG 24 hr tablet  Take 500 mg by mouth 2 (two) times daily.       .  metoprolol succinate (TOPROL-XL) 25 MG 24 hr tablet  Take 1 tablet (25 mg total) by mouth daily.  30 tablet  6   .   oxyCODONE-acetaminophen (PERCOCET/ROXICET) 5-325 MG tablet  Take 1-2 tablets by mouth every 4 (four) hours as needed for moderate pain.  30 tablet  0   .  simvastatin (ZOCOR) 80 MG tablet  Take 1 tablet (80 mg total) by mouth at bedtime.  90 tablet  3      No current facility-administered medications for this visit.     REVIEW OF SYSTEMS:   [X]  denotes positive finding, [ ]  denotes negative finding Cardiac    Comments:   Chest pain or chest pressure:       Shortness of breath upon exertion:       Short of breath when lying flat:       Irregular heart rhythm:       Constitutional       Fever or chills:         PHYSICAL EXAM:  Filed Vitals:   10/24/15 1135  BP: 140/74  Pulse: 84  Temp: 98.5 F (36.9 C)  TempSrc: Oral  Resp: 16  Height: 5\' 11"  (1.803 m)  Weight: 242 lb (109.77 kg)  SpO2:  97%   GENERAL: The patient is a well-nourished male, in no acute distress. The vital signs are documented above. PULMONARY: Nonlabored respiratory effort. VASCULAR: Left leg edema improved 3 ulcers just posterior to the distal most incision each about 2 cm in diameter with some dry eschar no compromise of the skin incision itself which is well-healed.   MEDICAL ISSUES: Status post left superficial femoral to posterior tibial artery bypass using composite non-reversed right upper extremity vein and PTFE on 09/06/2015  Soap and water cleansing of wounds daily otherwise keep dry follow-up one month to recheck  Ruta Hinds, MD [facility]: [phone] Pager: [phone]

## 2015-10-24 NOTE — Telephone Encounter (Signed)
Contacted pt. this AM.  Reported left lower leg incision had blisters that have opened, and now has "1 large and 2 small sores with scabs."  Reported that there has been a thin white / light bloody drainage.  Denied fever/ chills.  Denied any signs of redness or warmth.  Reported that with his diabetes, he is concerned that this is not healing.  Requested an appt. To have the incision checked.  Appt. given at 11:00 AM with [facility].  Pt. Agreed.

## 2015-10-28 ENCOUNTER — Other Ambulatory Visit: Payer: Self-pay | Admitting: *Deleted

## 2015-10-28 DIAGNOSIS — Z8673 Personal history of transient ischemic attack (TIA), and cerebral infarction without residual deficits: Secondary | ICD-10-CM

## 2015-10-28 DIAGNOSIS — R2689 Other abnormalities of gait and mobility: Secondary | ICD-10-CM

## 2015-11-07 ENCOUNTER — Encounter: Payer: BLUE CROSS/BLUE SHIELD | Admitting: *Deleted

## 2015-11-11 ENCOUNTER — Ambulatory Visit: Payer: BLUE CROSS/BLUE SHIELD | Attending: Vascular Surgery | Admitting: Occupational Therapy

## 2015-11-11 ENCOUNTER — Encounter: Payer: Self-pay | Admitting: Occupational Therapy

## 2015-11-11 DIAGNOSIS — M6281 Muscle weakness (generalized): Secondary | ICD-10-CM | POA: Diagnosis present

## 2015-11-11 DIAGNOSIS — R270 Ataxia, unspecified: Secondary | ICD-10-CM

## 2015-11-11 DIAGNOSIS — I69318 Other symptoms and signs involving cognitive functions following cerebral infarction: Secondary | ICD-10-CM
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
## 2015-11-11 NOTE — Patient Instructions (Signed)
Home program for putty grip, pinches at home 1-2 x/day x5-10 reps each. Min vc's required, handout issued and reviewed and performed in clinic today left hand.

## 2015-11-13 ENCOUNTER — Ambulatory Visit: Payer: BLUE CROSS/BLUE SHIELD | Admitting: Physical Therapy

## 2015-11-17 ENCOUNTER — Other Ambulatory Visit: Payer: Self-pay | Admitting: Cardiovascular Disease

## 2015-11-18 NOTE — Telephone Encounter (Signed)
Rx(s) sent to pharmacy electronically.  

## 2015-11-19 ENCOUNTER — Ambulatory Visit: Payer: BLUE CROSS/BLUE SHIELD | Admitting: Physical Therapy

## 2015-11-19 ENCOUNTER — Ambulatory Visit: Payer: BLUE CROSS/BLUE SHIELD | Attending: Family Medicine | Admitting: Occupational Therapy

## 2015-11-19 DIAGNOSIS — R2689 Other abnormalities of gait and mobility: Secondary | ICD-10-CM | POA: Diagnosis present

## 2015-11-19 DIAGNOSIS — M6281 Muscle weakness (generalized): Secondary | ICD-10-CM

## 2015-11-19 DIAGNOSIS — I69318 Other symptoms and signs involving cognitive functions following cerebral infarction: Secondary | ICD-10-CM | POA: Diagnosis present

## 2015-11-19 DIAGNOSIS — R2681 Unsteadiness on feet: Secondary | ICD-10-CM | POA: Insufficient documentation

## 2015-11-19 DIAGNOSIS — M5441 Lumbago with sciatica, right side: Secondary | ICD-10-CM | POA: Diagnosis present

## 2015-11-19 DIAGNOSIS — R270 Ataxia, unspecified: Secondary | ICD-10-CM | POA: Diagnosis present

## 2015-11-19 NOTE — Patient Instructions (Addendum)
  Coordination Activities  Perform the following activities for 20 minutes 1 times per day with left hand(s).   Rotate ball in fingertips (clockwise and counter-clockwise).  Toss ball in air and catch with the same hand.  Juggle 2 balls.  Flip cards 1 at a time as fast as you can.  Deal cards with your thumb (Hold deck in hand and push card off top with thumb).  Pick up coins and stack.  Pick up coins one at a time until you get 5-10 in your hand, then move coins from palm to fingertips to place in coin bank/container one at a time.  Flip card between each finger.  Rotate 2 golf balls in your hand each direction as able.  1. Grip Strengthening (Resistive Putty)   Squeeze putty using thumb and all fingers. Repeat 20 times. Do 1 sessions per day.   Extension (Assistive Putty)   Roll putty back and forth, being sure to use all fingertips. Repeat 3 times. Do 1 sessions per day.  Then pinch as below.   Palmar Pinch Strengthening (Resistive Putty)   Pinch putty between thumb and each fingertip in turn after rolling out    Finger and Thumb Extension (Resistive Putty)   With thumb and all fingers in center of putty donut, stretch out. Repeat 5-10 times. Do 1 sessions per day.   MP Flexion (Resistive Putty)   Bending only at large knuckles, press putty down against thumb. Keep fingertips straight. Repeat 10 times. Do 1 sessions per day.
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
## 2015-11-21 ENCOUNTER — Ambulatory Visit: Payer: BLUE CROSS/BLUE SHIELD | Admitting: Occupational Therapy

## 2015-11-21 ENCOUNTER — Ambulatory Visit: Payer: BLUE CROSS/BLUE SHIELD | Admitting: Physical Therapy

## 2015-11-21 DIAGNOSIS — R270 Ataxia, unspecified: Secondary | ICD-10-CM

## 2015-11-21 DIAGNOSIS — M6281 Muscle weakness (generalized): Secondary | ICD-10-CM

## 2015-11-21 DIAGNOSIS — R2689 Other abnormalities of gait and mobility: Secondary | ICD-10-CM

## 2015-11-21 DIAGNOSIS — R2681 Unsteadiness on feet: Secondary | ICD-10-CM
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
## 2015-11-21 NOTE — Patient Instructions (Signed)
With Support    Stand on one leg in neutral spine holding support. Hold _10-15___ seconds.  Gradually decrease support of hands. Repeat on other leg. Do _5___ repetitions, _1___ sets. Perform 2-3 times per day.  http://bt.exer.us/34   Copyright  VHI. All rights reserved.

## 2015-11-21 NOTE — Patient Instructions (Addendum)
Strengthening: Resisted Flexion   Attach tube to door.  Hold tubing with one arm at side. Pull forward and up. Move shoulder through pain-free range of motion. Repeat 15 times per set.  Do 1-2 sessions per day.    Strengthening: Resisted Extension   Attach one end to door.  Hold tubing in one hand, arm forward. Pull arm back, elbow straight. Repeat 15 times per set. Do 1-2 sessions per day.   Resisted Horizontal Abduction: Bilateral   Sit or stand, tubing in both hands, arms out in front. Keeping arms straight, pinch shoulder blades together and stretch arms out. Repeat 15 times per set.  Do 1-2 sessions per day.
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
## 2015-11-22 ENCOUNTER — Encounter: Payer: Self-pay | Admitting: Vascular Surgery

## 2015-11-25 ENCOUNTER — Ambulatory Visit: Payer: BLUE CROSS/BLUE SHIELD | Admitting: Physical Therapy

## 2015-11-25 ENCOUNTER — Encounter: Payer: Self-pay | Admitting: Occupational Therapy

## 2015-11-25 ENCOUNTER — Ambulatory Visit: Payer: BLUE CROSS/BLUE SHIELD | Admitting: Occupational Therapy

## 2015-11-25 DIAGNOSIS — M6281 Muscle weakness (generalized): Secondary | ICD-10-CM

## 2015-11-25 DIAGNOSIS — R2681 Unsteadiness on feet: Secondary | ICD-10-CM

## 2015-11-25 DIAGNOSIS — R2689 Other abnormalities of gait and mobility: Secondary | ICD-10-CM

## 2015-11-25 DIAGNOSIS — I69318 Other symptoms and signs involving cognitive functions following cerebral infarction: Secondary | ICD-10-CM

## 2015-11-25 DIAGNOSIS — R270 Ataxia, unspecified: Secondary | ICD-10-CM

## 2015-11-25 NOTE — Patient Instructions (Signed)
ABDUCTION: Standing - Resistance Band (Active)   Stand, feet flat. Against red resistance band, lift right leg out to side.  Repeat to other side. Complete _1__ sets of _10__ repetitions. Perform _2-3__ sessions per day.  ADDUCTION: Standing - Stable: Resistance Band (Active)   Stand, right leg out to side as far as possible. Against red resistance band, draw leg in across midline.  Repeat with other leg. Complete __1_ sets of __10_ repetitions. Perform _2-3__ sessions per day.  Strengthening: Hip Flexion - Resisted   With tubing around left ankle, anchor behind, bring leg forward, keeping knee straight.  Repeat with other leg. Repeat __10__ times per set. Do _1___ sets per session. Do _2-3___ sessions per day.  Strengthening: Hip Extension - Resisted   With tubing around right ankle, face anchor and pull leg straight back.  Repeat with other leg. Repeat __10__ times per set. Do _1___ sets per session. Do __2-3__ sessions per day.
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
## 2015-11-27 ENCOUNTER — Ambulatory Visit: Payer: BLUE CROSS/BLUE SHIELD | Admitting: Physical Therapy

## 2015-11-27 ENCOUNTER — Encounter: Payer: Self-pay | Admitting: Occupational Therapy

## 2015-11-27 ENCOUNTER — Ambulatory Visit: Payer: BLUE CROSS/BLUE SHIELD | Admitting: Occupational Therapy

## 2015-11-27 ENCOUNTER — Encounter: Payer: BLUE CROSS/BLUE SHIELD | Admitting: *Deleted

## 2015-11-27 ENCOUNTER — Ambulatory Visit: Payer: BLUE CROSS/BLUE SHIELD

## 2015-11-27 DIAGNOSIS — R2689 Other abnormalities of gait and mobility: Secondary | ICD-10-CM

## 2015-11-27 DIAGNOSIS — M6281 Muscle weakness (generalized): Secondary | ICD-10-CM

## 2015-11-27 DIAGNOSIS — R270 Ataxia, unspecified: Secondary | ICD-10-CM

## 2015-11-27 DIAGNOSIS — I69318 Other symptoms and signs involving cognitive functions following cerebral infarction: Secondary | ICD-10-CM

## 2015-11-27 DIAGNOSIS — R2681 Unsteadiness on feet: Secondary | ICD-10-CM
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
## 2015-11-28 ENCOUNTER — Other Ambulatory Visit: Payer: Self-pay

## 2015-11-28 ENCOUNTER — Encounter: Payer: Self-pay | Admitting: Vascular Surgery

## 2015-11-28 ENCOUNTER — Ambulatory Visit (INDEPENDENT_AMBULATORY_CARE_PROVIDER_SITE_OTHER): Payer: Self-pay | Admitting: Vascular Surgery

## 2015-11-28 VITALS — BP 138/82 | HR 85 | Ht 71.0 in | Wt 238.0 lb

## 2015-11-28 DIAGNOSIS — I739 Peripheral vascular disease, unspecified: Secondary | ICD-10-CM

## 2015-11-28 NOTE — Progress Notes (Signed)
Patient name: [name]    MRN: [id number]        DOB: Dec 24, 1955        Sex: male  REASON FOR VISIT: Postoperative follow-up   HPI: [name] is a 60 y.o. male who presents for follow-up status post left superficial femoral to posterior tibial artery bypass using composite non-reversed right upper extremity vein and PTFE on 09/06/2015. This was performed for rest pain. Today, the patient states that he has rest pain has completely resolved. The patient is doing well without any other complaints. He is not smoking.   He also had a recent stroke which involved his left side. Carotid duplex workup was negative.  He continues to walk but is still having some weakness difficulties and some cognitive difficulties post stroke. I reviewed his physical therapy and occupational therapy assessment today.     Current Outpatient Prescriptions    Medication   Sig   Dispense   Refill    .   aspirin 325 MG tablet   Take 325 mg by mouth daily with breakfast.           .   clopidogrel (PLAVIX) 75 MG tablet   TAKE 1 TABLET BY MOUTH EVERY DAY   90 tablet   3    .   fenofibrate (TRICOR) 145 MG tablet   Take 1 tablet (145 mg total) by mouth daily.   90 tablet   3    .   gabapentin (NEURONTIN) 300 MG capsule   Take 300 mg by mouth 3 (three) times daily.           [name]   HYDROcodone-acetaminophen (NORCO/VICODIN) 5-325 MG tablet   Take 1 tablet by mouth every 4 (four) hours as needed.   50 tablet   0    .   insulin aspart (NOVOLOG) 100 UNIT/ML injection   Inject 35 Units into the skin 3 (three) times daily before meals.           [name]   LEVEMIR FLEXTOUCH 100 UNIT/ML Pen   Inject 75 Units into the skin 2 (two) times daily. Reported on 08/21/2015      4    .   lisinopril-hydrochlorothiazide (PRINZIDE,ZESTORETIC) 20-12.5 MG per tablet   Take 1 tablet by mouth daily with breakfast.           .   metFORMIN (GLUCOPHAGE-XR) 500 MG 24 hr tablet   Take 500 mg by mouth 2 (two) times daily.          .   metoprolol succinate  (TOPROL-XL) 25 MG 24 hr tablet   Take 1 tablet (25 mg total) by mouth daily.   30 tablet   6    .   oxyCODONE-acetaminophen (PERCOCET/ROXICET) 5-325 MG tablet   Take 1-2 tablets by mouth every 4 (four) hours as needed for moderate pain.   30 tablet   0    .   simvastatin (ZOCOR) 80 MG tablet   Take 1 tablet (80 mg total) by mouth at bedtime.   90 tablet   3       No current facility-administered medications for this visit.      REVIEW OF SYSTEMS:   [X]  denotes positive finding, [ ]  denotes negative finding Cardiac      Comments:    Chest pain or chest pressure:          Shortness of breath upon exertion:  Short of breath when lying flat:          Irregular heart rhythm:          Constitutional          Fever or chills:            PHYSICAL EXAM:  Filed Vitals:   11/28/15 0921  BP: 138/82  Pulse: 85  Height: 5\' 11"  (1.803 m)  Weight: 238 lb (107.956 kg)  SpO2: 97%   VASCULAR: Left leg edema improved All incisions healed 2+ posterior tibial pulse left foot Neuro: Symmetric upper extremity and lower extremity motor strength although subtle weakness in the left leg   MEDICAL ISSUES: Patient slowly recovering from his recent stroke. He also still has some weakness in his left leg from his operation. I do not believe he is quite ready to go back to work yet. He should be able to return to work towards the end of May. He was given a note for work today. He will follow-up with me in 3 months with a graft duplex at that time and ABIs.  Ruta Hinds, MD [facility] Office: [phone] Pager: [phone]

## 2015-11-28 NOTE — Addendum Note (Signed)
Addended by: Dorothyann Gibbs on: 11/28/2015 11:22 AM   Modules accepted: Orders

## 2015-12-02 ENCOUNTER — Ambulatory Visit: Payer: BLUE CROSS/BLUE SHIELD | Admitting: Physical Therapy

## 2015-12-02 ENCOUNTER — Ambulatory Visit: Payer: BLUE CROSS/BLUE SHIELD | Admitting: Occupational Therapy

## 2015-12-02 ENCOUNTER — Encounter: Payer: BLUE CROSS/BLUE SHIELD | Admitting: Occupational Therapy

## 2015-12-02 DIAGNOSIS — M6281 Muscle weakness (generalized): Secondary | ICD-10-CM

## 2015-12-02 DIAGNOSIS — R2689 Other abnormalities of gait and mobility: Secondary | ICD-10-CM

## 2015-12-02 DIAGNOSIS — R2681 Unsteadiness on feet: Secondary | ICD-10-CM

## 2015-12-02 DIAGNOSIS — R270 Ataxia, unspecified: Secondary | ICD-10-CM
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
## 2015-12-04 ENCOUNTER — Ambulatory Visit: Payer: BLUE CROSS/BLUE SHIELD | Admitting: Occupational Therapy

## 2015-12-04 ENCOUNTER — Encounter: Payer: Self-pay | Admitting: Occupational Therapy

## 2015-12-04 ENCOUNTER — Ambulatory Visit: Payer: BLUE CROSS/BLUE SHIELD | Admitting: Physical Therapy

## 2015-12-04 DIAGNOSIS — M6281 Muscle weakness (generalized): Secondary | ICD-10-CM

## 2015-12-04 DIAGNOSIS — R270 Ataxia, unspecified: Secondary | ICD-10-CM

## 2015-12-04 DIAGNOSIS — R2681 Unsteadiness on feet: Secondary | ICD-10-CM

## 2015-12-04 DIAGNOSIS — R2689 Other abnormalities of gait and mobility: Secondary | ICD-10-CM

## 2015-12-04 NOTE — Therapy (Signed)
[facility] [address]  [place], [place], [address] Phone: [phone]   Fax:  [phone]  Occupational Therapy Treatment  Patient Details  Name: [name] MRN: [id number] Date of Birth: Dec 23, 1955 Referring Provider: Dr Oneida Alar  Encounter Date: 12/04/2015      OT End of Session - 12/04/15 0859    Visit Number 7   Number of Visits 12   Date for OT Re-Evaluation 01/06/16   Authorization Type BCBS/BCBS, no visit limit, no auth required   OT Start Time 0800   OT Stop Time 0845   OT Time Calculation (min) 45 min   Activity Tolerance Patient tolerated treatment well      Past Medical History  Diagnosis Date  . Hyperlipidemia   . CAD (coronary artery disease)     OV, Dr Harlow Asa, [facility] 5/12 on chart  EKG 10/12 EPIC,  chest x ray 01/07/11 EPIC  . Peripheral vascular disease, unspecified ([place]) 03/2015    PCI to the right popliteal  . Hypertension   . Neuropathy, peripheral (HCC)     both feet  . Carpal tunnel syndrome     peripheral neuropathy  . Myocardial infarction ([place]) 02/2001  . DVT (deep venous thrombosis) (HCC)     hx LLE  . Type II diabetes mellitus ([place])   . Arthritis     "hx; cleaned it out of both shoulders"  . Skin cancer     "have had them cut or burned off my face" (03/28/2015)  . Chronic shoulder pain     "both"  . History of kidney stones     Past Surgical History  Procedure Laterality Date  . Knee arthroscopy Left X 2  . Carpal tunnel release Right 2000's  . Shoulder arthroscopy Left   . Femoral-tibial bypass graft Left 01/07/11    fem-posterior tibial BPG using reversed left GSV               12/15/11 OK BY DR Ninfa Linden TO CONTINUE ASA AND PLAVIX  . Orif radius & ulna fractures Left   . Popliteal artery stent Left 2010-2012 X 4  . Carpal tunnel release  12/18/2011    Procedure: CARPAL TUNNEL RELEASE;  Surgeon: Mcarthur Rossetti, MD;  Location: [facility];  Service: Orthopedics;   Laterality: Left;  Left Open Carpal Tunnel Release  . Shoulder arthroscopy Right 12/18/2011  . Shoulder arthroscopy  07/08/2012    Procedure: ARTHROSCOPY SHOULDER;  Surgeon: Mcarthur Rossetti, MD;  [facility]: [facility];  Service: Orthopedics;  Laterality: Left;  Left Shoulder Arthroscopy with Manipulation and Extensive Debridement  . Cystoscopy  several done in past  . Lithotripsy  several done in past  . Shoulder arthroscopy with rotator cuff repair Left 07/14/2013    Procedure: LEFT SHOULDER ARTHROSCOPY WITH EXTENSIVE DEBRIDEMENT, DISTAL CLAVICLE REPAIR;  Surgeon: Mcarthur Rossetti, MD;  [facility];  Service: Orthopedics;  Laterality: Left;  . Peripheral vascular catheterization N/A 03/28/2015    Procedure: Lower Extremity Angiography;  Surgeon: Lorretta Harp, MD;  Location: [facility];  Service: Cardiovascular;  Laterality: N/A;  . Peripheral vascular catheterization Right 03/28/2015    Procedure: Peripheral Vascular Atherectomy;  Surgeon: Lorretta Harp, MD;  Location: [facility];  Service: Cardiovascular;  Laterality: Right;  popliteal;   . Appendectomy  1977  . Laparoscopic cholecystectomy  1990's  . Femoropopliteal thrombectomy / embolectomy  ~ 2010  . Skin cancer excision      "  left side of my forehead"  . Coronary artery bypass graft  2002    CABG X 4  . Cardiac catheterization  2002       . Fracture surgery    . Coronary angioplasty    . Peripheral vascular catheterization N/A 08/23/2015    Procedure: Abdominal Aortogram;  Surgeon: Elam Dutch, MD;  Location: [facility];  Service: Cardiovascular;  Laterality: N/A;  . Femoral-tibial bypass graft Left 09/06/2015    Procedure: LEFT FEMORAL-POSTERIOR TIBIAL ARTERY BYPASS GRAFT WITH COMPOSITE PTFE AND RIGHT ARM VEIN;  Surgeon: Elam Dutch, MD;  Location: [facility];  Service: Vascular;  Laterality: Left;  . Vein harvest Right 09/06/2015    Procedure: RIGHT ARM VEIN HARVEST;  Surgeon: Elam Dutch, MD;  Location: [facility];  Service: Vascular;  Laterality: Right;    There were no vitals filed for this visit.      Subjective Assessment - 12/04/15 0801    Pertinent History hx of neuropathy bilateral hands; hx of L frozen shoulder with 3 surgeries   Patient Stated Goals I need to get back to work   Currently in Pain? Yes   Pain Location Leg  and lower back   Pain Orientation Right   Pain Descriptors / Indicators Aching;Dull   Pain Type Chronic pain   Pain Frequency Intermittent   Aggravating Factors  riding in the car too long   Pain Relieving Factors rest            [facility] OT Assessment - 12/04/15 0001    Hand Function   Right Hand Grip (lbs) 90   Left Hand Grip (lbs) 73                  OT Treatments/Exercises (OP) - 12/04/15 0001    ADLs   Functional Mobility Ambulating while tossing ball, bouncing ball, catching ball while walking forward/backwards with LOB x 1. Pt then retrieving/replacing items from high shelf with back stepping Rt/Lt side  and LOB x2 on LLE when lifting Rt for directional changes   Shoulder Exercises: ROM/Strengthening   UBE (Upper Arm Bike) x 5 minutes Level 5 resistance for strength/endurance   Hand Exercises   Other Hand Exercises Gripper set at 55 lbs resistance to pick up blocks Lt hand for sustained grip strength with min difficulty    Functional Reaching Activities   High Level High level reaching to place large pegs in pegboard vertical surface with 2 lb weight on LUE requiring 1 rest break to complete 4 rows                  OT Short Term Goals - 11/27/15 0904    OT SHORT TERM GOAL #1   Title Pt will be I home program LUE   Time 4   Period Weeks   Status Achieved   OT SHORT TERM GOAL #2   Title Pt will demonstrate increased functional use LUE as seen by ability to button and fasten clothing Mod I   Time 4   Period Weeks   Status Achieved  11/21/15:  met per pt report   OT SHORT TERM GOAL #3   Title  Further assess vision in functional context during therapy sessions   Time 4   Period Weeks   Status Achieved  Pt appears to have resolved overshooting/depth perception issues.    OT SHORT TERM GOAL #4   Title Further assess cognition in functional context of therapy session -  ie. cooking,home safety assessments at mod I level   Time 4   Period Weeks   Status Achieved   OT SHORT TERM GOAL #5   Title Pt will I'ly state signs, symptoms, and precautions CVA   Status Achieved  11/21/15           OT Long Term Goals - 11/27/15 0905    OT LONG TERM GOAL #1   Title Pt will demonstrate improved coordination LUE as seen by improved 9 Hold peg test score by 5 seconds or greater   Time 8   Period Weeks   Status On-going   OT LONG TERM GOAL #2   Title Pt will be I upgraded home program for LUE   Time 8   Period Weeks   Status On-going   OT LONG TERM GOAL #3   Title Pt will be Mod I simple snack prep in kitchen demonstrating Mod I safety awareness   Time 8   Period Weeks   Status Achieved   OT LONG TERM GOAL #4   Title Pt will demonstrate improved cognition as seen by his ability to I'ly state precautions related to ADL's/home safety   Time 8   Period Weeks   Status On-going               Plan - 12/04/15 0900    Clinical Impression Statement Pt with improved grip strength Lt hand. Pt with overall increased endurance/strength   OT Treatment/Interventions Self-care/ADL training;Therapeutic exercise;Patient/family education;Neuromuscular education;Therapeutic exercises;Therapeutic activities;DME and/or AE instruction;Cognitive remediation/compensation;Visual/perceptual remediation/compensation   Plan Theraband ex's    OT Home Exercise Plan Education provided:  11/19/15 coordination HEP, green putty HEP; 11/21/15 yellow HEP   Consulted and Agree with Plan of Care Patient      Patient will benefit from skilled therapeutic intervention in order to improve the following deficits and  impairments:  Abnormal gait, Decreased coordination, Impaired sensation, Decreased safety awareness, Impaired UE functional use, Decreased knowledge of use of DME, Decreased cognition, Decreased strength, Impaired vision/preception, Decreased activity tolerance  Visit Diagnosis: Muscle weakness (generalized)  Other abnormalities of gait and mobility  Ataxia, unspecified    Problem List Patient Active Problem List   Diagnosis Date Noted  . Hypertriglyceridemia 10/12/2015  . Uncontrolled diabetes mellitus with hyperglycemia, with long-term current use of insulin ([place]) 10/12/2015  . Acute ischemic stroke ([place]) 10/10/2015  . Diabetes type 2, controlled ([place]) 10/10/2015  . CVA (cerebral infarction) 10/10/2015  . Claudication ([place]) 03/28/2015  . Dyspnea on exertion 02/22/2015  . Pain in joint, lower leg 12/13/2014  . Cramp of both lower extremities 12/13/2014  . Essential hypertension 08/01/2013  . Hyperlipidemia 08/01/2013  . Coronary artery disease, history of CABG 08/01/2013  . Incomplete rotator cuff tear left shoulder 07/14/2013  . S/P arthroscopy of shoulder 07/14/2013  . Aftercare following surgery of the circulatory system, [place] 11/10/2012  . PVD (peripheral vascular disease) ([place]) 04/26/2012  . 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 Carpal tunnel syndrome of left wrist 12/18/2011  . Peripheral vascular disease, unspecified ([place]) 07/16/2011  . PAD (peripheral artery disease) ([place]) 04/23/2011    Carey Bullocks, OTR/L 12/04/2015, 9:02 AM  [facility] [address]  [place], [place], [address] Phone: [phone]   Fax:  [phone]  Name: [name] MRN: [id number] Date of Birth: 12-24-1955

## 2015-12-04 NOTE — Therapy (Addendum)
[facility] [address]  [place], [place], [address] Phone: [phone]   Fax:  [phone]  Physical Therapy Treatment  Patient Details  Name: [name] MRN: [id number] Date of Birth: [birth year] Referring Provider: Dr. Oneida Alar  Encounter Date: 12/04/2015      PT End of Session - 12/04/15 0904    Visit Number 5   Number of Visits 8   Date for PT Re-Evaluation 12/19/15   PT Start Time [id number]   PT Stop Time 0934   PT Time Calculation (min) 40 min   Activity Tolerance Patient tolerated treatment well   Behavior During Therapy [facility] for tasks assessed/performed      Past Medical History  Diagnosis Date  . Hyperlipidemia   . CAD (coronary artery disease)     OV, Dr Harlow Asa, MYOVIEW 5/12 on chart  EKG 10/12 EPIC,  chest x ray 01/07/11 EPIC  . Peripheral vascular disease, unspecified ([place]) 03/2015    PCI to the right popliteal  . Hypertension   . Neuropathy, peripheral (HCC)     both feet  . Carpal tunnel syndrome     peripheral neuropathy  . Myocardial infarction ([place]) 02/2001  . DVT (deep venous thrombosis) (HCC)     hx LLE  . Type II diabetes mellitus ([place])   . Arthritis     "hx; cleaned it out of both shoulders"  . Skin cancer     "have had them cut or burned off my face" (03/28/2015)  . Chronic shoulder pain     "both"  . History of kidney stones     Past Surgical History  Procedure Laterality Date  . Knee arthroscopy Left X 2  . Carpal tunnel release Right 2000's  . Shoulder arthroscopy Left   . Femoral-tibial bypass graft Left 01/07/11    fem-posterior tibial BPG using reversed left GSV               12/15/11 OK BY DR Ninfa Linden TO CONTINUE ASA AND PLAVIX  . Orif radius & ulna fractures Left   . Popliteal artery stent Left 2010-2012 X 4  . Carpal tunnel release  12/18/2011    Procedure: CARPAL TUNNEL RELEASE;  Surgeon: Mcarthur Rossetti, MD;  [facility];  Service: Orthopedics;   Laterality: Left;  Left Open Carpal Tunnel Release  . Shoulder arthroscopy Right 12/18/2011  . Shoulder arthroscopy  07/08/2012    Procedure: ARTHROSCOPY SHOULDER;  Surgeon: Mcarthur Rossetti, MD;  [facility];  Service: Orthopedics;  Laterality: Left;  Left Shoulder Arthroscopy with Manipulation and Extensive Debridement  . Cystoscopy  several done in past  . Lithotripsy  several done in past  . Shoulder arthroscopy with rotator cuff repair Left 07/14/2013    Procedure: LEFT SHOULDER ARTHROSCOPY WITH EXTENSIVE DEBRIDEMENT, DISTAL CLAVICLE REPAIR;  Surgeon: Mcarthur Rossetti, MD;  [facility];  Service: Orthopedics;  Laterality: Left;  . Peripheral vascular catheterization N/A 03/28/2015    Procedure: Lower Extremity Angiography;  Surgeon: Lorretta Harp, MD;  Location: [facility];  Service: Cardiovascular;  Laterality: N/A;  . Peripheral vascular catheterization Right 03/28/2015    Procedure: Peripheral Vascular Atherectomy;  Surgeon: Lorretta Harp, MD;  Location: [facility];  Service: Cardiovascular;  Laterality: Right;  popliteal;   . Appendectomy  1977  . Laparoscopic cholecystectomy  1990's  . Femoropopliteal thrombectomy / embolectomy  ~ 2010  . Skin cancer excision      "  left side of my forehead"  . Coronary artery bypass graft  2002    CABG X 4  . Cardiac catheterization  2002       . Fracture surgery    . Coronary angioplasty    . Peripheral vascular catheterization N/A 08/23/2015    Procedure: Abdominal Aortogram;  Surgeon: Elam Dutch, MD;  Location: [facility];  Service: Cardiovascular;  Laterality: N/A;  . Femoral-tibial bypass graft Left 09/06/2015    Procedure: LEFT FEMORAL-POSTERIOR TIBIAL ARTERY BYPASS GRAFT WITH COMPOSITE PTFE AND RIGHT ARM VEIN;  Surgeon: Elam Dutch, MD;  Location: [facility];  Service: Vascular;  Laterality: Left;  . Vein harvest Right 09/06/2015    Procedure: RIGHT ARM VEIN HARVEST;  Surgeon: Elam Dutch, MD;  Location: [facility];  Service: Vascular;  Laterality: Right;    There were no vitals filed for this visit.      Subjective Assessment - 12/04/15 0859    Subjective Pt noting more back pain & R buttucok pain today. States he has received an epidural injection for this ~3 wks ago but it hasn't really helped.   Currently in Pain? Yes   Pain Score 5    Pain Location Back   Pain Orientation Right   Pain Radiating Towards R buttock & posterior upper thigh   Aggravating Factors  riding in the car too long   Pain Relieving Factors rest           Today's Treatment  TherEx NuStep - lvl 4 x 4' B SKTC stretch 2x30" LTR 10x5" B Supine hamstring stretch - manual x30", with strap 2x30" Bridge + Hip ABD isometric with black TB x15 Bridge + Alternating Hip ABD/ER with black TB 5x2 Hooklying march with black TB x15 B Sidelying Hip ABD clam with black TB x15 Seated L Ankle PF with black TB x15 Seated L Ankle DF/IV/EV with red TB x15 each           PT Education - 12/04/15 1042    Education provided Yes   Education Details Low back stretches, Ankle TB exercises   Person(s) Educated Patient   Methods Explanation;Demonstration;Handout   Comprehension Verbalized understanding;Returned demonstration             PT Long Term Goals - 11/27/15 1619    PT LONG TERM GOAL #1   Title verbalize understanding of CVA risk factors/warning signs (12/19/15)   Time 4   Period Weeks   Status On-going   PT LONG TERM GOAL #2   Title independent with HEP (12/19/15)   Time 4   Period Weeks   Status On-going   PT LONG TERM GOAL #3   Title improve SLS on LLE to > 10 sec for improved strength and balance (12/19/15)   Time 4   Period Weeks   Status On-going   PT LONG TERM GOAL #4   Title improve dynamic gait index to >/= 21/24 for improved balance and mobility (12/19/15)   Time 4   Period Weeks   Status On-going   PT LONG TERM GOAL #5   Title improve timed up and go to < 9 sec for  improved mobility (12/19/15)   Time 4   Period Weeks   Status On-going               Plan - 12/04/15 1016    Clinical Impression Statement Pt with c/o increased LBP with radiation into R buttock, posterior upper thigh today, therefore  added stretching for low back and proximal LE musculature and continued focus on core strengthening. Reviewed ankle exercises and added to HEP along with low back stretches. Will continue to plan to progress to more CKC/weight bearing strengthening and balance training as able.    PT Treatment/Interventions ADLs/Self Care Home Management;Patient/family education;Neuromuscular re-education;Balance training;Therapeutic exercise;Therapeutic activities;Functional mobility training;Stair training;Gait training;Orthotic Fit/Training;Vestibular   PT Next Visit Plan Add balance/strengthening exercises to HEP PRN; compliant surface activities and dynamic gait   Consulted and Agree with Plan of Care Patient      Patient will benefit from skilled therapeutic intervention in order to improve the following deficits and impairments:  Abnormal gait, Decreased strength, Postural dysfunction, Difficulty walking, Decreased mobility, Decreased balance, Decreased coordination  Visit Diagnosis: Muscle weakness (generalized)  Unsteadiness on feet  Other abnormalities of gait and mobility     Problem List Patient Active Problem List   Diagnosis Date Noted  . Hypertriglyceridemia 10/12/2015  . Uncontrolled diabetes mellitus with hyperglycemia, with long-term current use of insulin ([place]) 10/12/2015  . Acute ischemic stroke ([place]) 10/10/2015  . Diabetes type 2, controlled ([place]) 10/10/2015  . CVA (cerebral infarction) 10/10/2015  . Claudication ([place]) 03/28/2015  . Dyspnea on exertion 02/22/2015  . Pain in joint, lower leg 12/13/2014  . Cramp of both lower extremities 12/13/2014  . Essential hypertension 08/01/2013  . Hyperlipidemia 08/01/2013  . Coronary artery  disease, history of CABG 08/01/2013  . Incomplete rotator cuff tear left shoulder 07/14/2013  . S/P arthroscopy of shoulder 07/14/2013  . Aftercare following surgery of the circulatory system, [place] 11/10/2012  . PVD (peripheral vascular disease) ([place]) 04/26/2012  . 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 Carpal tunnel syndrome of left wrist 12/18/2011  . Peripheral vascular disease, unspecified ([place]) 07/16/2011  . PAD (peripheral artery disease) ([place]) 04/23/2011    Percival Spanish, PT, MPT 12/04/2015, 10:43 AM  [facility] [address]  [place], [place], [address] Phone: [phone]   Fax:  [phone]  Name: KELYNN STAVELY MRN: [id number] Date of Birth: 03/08/1956

## 2015-12-05 ENCOUNTER — Encounter: Payer: BLUE CROSS/BLUE SHIELD | Admitting: Occupational Therapy

## 2015-12-05 ENCOUNTER — Ambulatory Visit: Payer: BLUE CROSS/BLUE SHIELD | Admitting: Physical Therapy

## 2015-12-09 ENCOUNTER — Ambulatory Visit: Payer: BLUE CROSS/BLUE SHIELD | Admitting: Physical Therapy

## 2015-12-09 ENCOUNTER — Ambulatory Visit: Payer: BLUE CROSS/BLUE SHIELD | Admitting: Occupational Therapy

## 2015-12-09 ENCOUNTER — Encounter: Payer: BLUE CROSS/BLUE SHIELD | Admitting: *Deleted

## 2015-12-11 ENCOUNTER — Ambulatory Visit: Payer: BLUE CROSS/BLUE SHIELD

## 2015-12-11 ENCOUNTER — Ambulatory Visit: Payer: BLUE CROSS/BLUE SHIELD | Admitting: Occupational Therapy

## 2015-12-11 ENCOUNTER — Encounter: Payer: Self-pay | Admitting: Occupational Therapy

## 2015-12-11 DIAGNOSIS — M6281 Muscle weakness (generalized): Secondary | ICD-10-CM

## 2015-12-11 DIAGNOSIS — R270 Ataxia, unspecified: Secondary | ICD-10-CM

## 2015-12-11 DIAGNOSIS — R2689 Other abnormalities of gait and mobility: Secondary | ICD-10-CM

## 2015-12-11 DIAGNOSIS — R2681 Unsteadiness on feet: Secondary | ICD-10-CM
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
## 2015-12-12 ENCOUNTER — Encounter: Payer: BLUE CROSS/BLUE SHIELD | Admitting: Occupational Therapy

## 2015-12-12 ENCOUNTER — Ambulatory Visit: Payer: BLUE CROSS/BLUE SHIELD | Admitting: Physical Therapy

## 2015-12-12 DIAGNOSIS — M6281 Muscle weakness (generalized): Secondary | ICD-10-CM

## 2015-12-12 DIAGNOSIS — R2689 Other abnormalities of gait and mobility: Secondary | ICD-10-CM

## 2015-12-12 DIAGNOSIS — M5441 Lumbago with sciatica, right side: Secondary | ICD-10-CM

## 2015-12-12 DIAGNOSIS — R2681 Unsteadiness on feet: Secondary | ICD-10-CM
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
## 2015-12-13 ENCOUNTER — Encounter: Payer: Self-pay | Admitting: *Deleted

## 2015-12-18 ENCOUNTER — Ambulatory Visit: Payer: BLUE CROSS/BLUE SHIELD

## 2015-12-23 ENCOUNTER — Encounter: Payer: Self-pay | Admitting: Occupational Therapy

## 2015-12-23 ENCOUNTER — Ambulatory Visit: Payer: BLUE CROSS/BLUE SHIELD | Attending: Family Medicine | Admitting: Occupational Therapy

## 2015-12-23 ENCOUNTER — Ambulatory Visit: Payer: BLUE CROSS/BLUE SHIELD

## 2015-12-23 VITALS — BP 142/90 | HR 92

## 2015-12-23 DIAGNOSIS — I69318 Other symptoms and signs involving cognitive functions following cerebral infarction: Secondary | ICD-10-CM | POA: Diagnosis present

## 2015-12-23 DIAGNOSIS — M6281 Muscle weakness (generalized): Secondary | ICD-10-CM | POA: Insufficient documentation

## 2015-12-23 DIAGNOSIS — R2681 Unsteadiness on feet: Secondary | ICD-10-CM | POA: Insufficient documentation
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
## 2015-12-25 ENCOUNTER — Ambulatory Visit: Payer: BLUE CROSS/BLUE SHIELD | Admitting: Physical Therapy

## 2015-12-25 ENCOUNTER — Ambulatory Visit: Payer: BLUE CROSS/BLUE SHIELD | Admitting: Occupational Therapy

## 2016-01-16 ENCOUNTER — Encounter (HOSPITAL_COMMUNITY): Payer: BLUE CROSS/BLUE SHIELD

## 2016-01-16 ENCOUNTER — Other Ambulatory Visit (HOSPITAL_COMMUNITY): Payer: BLUE CROSS/BLUE SHIELD

## 2016-01-16 ENCOUNTER — Ambulatory Visit: Payer: BLUE CROSS/BLUE SHIELD | Admitting: Vascular Surgery

## 2016-01-22 DIAGNOSIS — Z0279 Encounter for issue of other medical certificate: Secondary | ICD-10-CM | POA: Diagnosis not present

## 2016-01-28 ENCOUNTER — Emergency Department (HOSPITAL_COMMUNITY): Payer: BLUE CROSS/BLUE SHIELD

## 2016-01-28 ENCOUNTER — Encounter (HOSPITAL_COMMUNITY): Payer: Self-pay | Admitting: Emergency Medicine

## 2016-01-28 ENCOUNTER — Inpatient Hospital Stay (HOSPITAL_COMMUNITY)
Admission: EM | Admit: 2016-01-28 | Discharge: 2016-01-31 | DRG: 066 | Disposition: A | Discharge status: Home / Self Care | Payer: BLUE CROSS/BLUE SHIELD | Attending: Internal Medicine | Admitting: Internal Medicine

## 2016-01-28 DIAGNOSIS — I69320 Aphasia following cerebral infarction: Secondary | ICD-10-CM

## 2016-01-28 DIAGNOSIS — I6350 Cerebral infarction due to unspecified occlusion or stenosis of unspecified cerebral artery: Secondary | ICD-10-CM

## 2016-01-28 DIAGNOSIS — R471 Dysarthria and anarthria: Secondary | ICD-10-CM | POA: Diagnosis present

## 2016-01-28 DIAGNOSIS — G8929 Other chronic pain: Secondary | ICD-10-CM | POA: Diagnosis present

## 2016-01-28 DIAGNOSIS — I2510 Atherosclerotic heart disease of native coronary artery without angina pectoris: Secondary | ICD-10-CM | POA: Diagnosis present

## 2016-01-28 DIAGNOSIS — E1159 Type 2 diabetes mellitus with other circulatory complications: Secondary | ICD-10-CM

## 2016-01-28 DIAGNOSIS — Z87891 Personal history of nicotine dependence: Secondary | ICD-10-CM

## 2016-01-28 DIAGNOSIS — Z7984 Long term (current) use of oral hypoglycemic drugs: Secondary | ICD-10-CM

## 2016-01-28 DIAGNOSIS — Z794 Long term (current) use of insulin: Secondary | ICD-10-CM

## 2016-01-28 DIAGNOSIS — I771 Stricture of artery: Secondary | ICD-10-CM

## 2016-01-28 DIAGNOSIS — M25511 Pain in right shoulder: Secondary | ICD-10-CM | POA: Diagnosis present

## 2016-01-28 DIAGNOSIS — Z6833 Body mass index (BMI) 33.0-33.9, adult: Secondary | ICD-10-CM

## 2016-01-28 DIAGNOSIS — E669 Obesity, unspecified: Secondary | ICD-10-CM | POA: Diagnosis present

## 2016-01-28 DIAGNOSIS — Z7982 Long term (current) use of aspirin: Secondary | ICD-10-CM

## 2016-01-28 DIAGNOSIS — E1142 Type 2 diabetes mellitus with diabetic polyneuropathy: Secondary | ICD-10-CM | POA: Diagnosis present

## 2016-01-28 DIAGNOSIS — Z79899 Other long term (current) drug therapy: Secondary | ICD-10-CM

## 2016-01-28 DIAGNOSIS — G4730 Sleep apnea, unspecified: Secondary | ICD-10-CM | POA: Diagnosis present

## 2016-01-28 DIAGNOSIS — E1151 Type 2 diabetes mellitus with diabetic peripheral angiopathy without gangrene: Secondary | ICD-10-CM | POA: Diagnosis present

## 2016-01-28 DIAGNOSIS — I739 Peripheral vascular disease, unspecified: Secondary | ICD-10-CM | POA: Diagnosis present

## 2016-01-28 DIAGNOSIS — E781 Pure hyperglyceridemia: Secondary | ICD-10-CM | POA: Diagnosis present

## 2016-01-28 DIAGNOSIS — E1165 Type 2 diabetes mellitus with hyperglycemia: Secondary | ICD-10-CM

## 2016-01-28 DIAGNOSIS — I480 Paroxysmal atrial fibrillation: Secondary | ICD-10-CM

## 2016-01-28 DIAGNOSIS — Z7902 Long term (current) use of antithrombotics/antiplatelets: Secondary | ICD-10-CM

## 2016-01-28 DIAGNOSIS — I639 Cerebral infarction, unspecified: Secondary | ICD-10-CM | POA: Diagnosis present

## 2016-01-28 DIAGNOSIS — I252 Old myocardial infarction: Secondary | ICD-10-CM

## 2016-01-28 DIAGNOSIS — M25512 Pain in left shoulder: Secondary | ICD-10-CM | POA: Diagnosis present

## 2016-01-28 DIAGNOSIS — Z85828 Personal history of other malignant neoplasm of skin: Secondary | ICD-10-CM

## 2016-01-28 DIAGNOSIS — I651 Occlusion and stenosis of basilar artery: Secondary | ICD-10-CM | POA: Insufficient documentation

## 2016-01-28 DIAGNOSIS — Z951 Presence of aortocoronary bypass graft: Secondary | ICD-10-CM

## 2016-01-28 DIAGNOSIS — E785 Hyperlipidemia, unspecified: Secondary | ICD-10-CM | POA: Diagnosis present

## 2016-01-28 DIAGNOSIS — I10 Essential (primary) hypertension: Secondary | ICD-10-CM | POA: Diagnosis present

## 2016-01-28 HISTORY — DX: Cerebral infarction, unspecified: I63.9

## 2016-01-28 LAB — URINALYSIS, ROUTINE W REFLEX MICROSCOPIC
Bilirubin Urine: NEGATIVE
Hgb urine dipstick: NEGATIVE
KETONES UR: NEGATIVE mg/dL
LEUKOCYTES UA: NEGATIVE
Nitrite: NEGATIVE
PROTEIN: NEGATIVE mg/dL
Specific Gravity, Urine: 1.041 — ABNORMAL HIGH (ref 1.005–1.030)
pH: 5.5 (ref 5.0–8.0)

## 2016-01-28 LAB — BASIC METABOLIC PANEL
Anion gap: 10 (ref 5–15)
BUN: 16 mg/dL (ref 6–20)
CALCIUM: 9.8 mg/dL (ref 8.9–10.3)
CHLORIDE: 100 mmol/L — AB (ref 101–111)
CO2: 23 mmol/L (ref 22–32)
CREATININE: 0.95 mg/dL (ref 0.61–1.24)
GFR calc Af Amer: >60 mL/min (ref >60)
Glucose, Bld: 390 mg/dL — ABNORMAL HIGH (ref 65–99)
Potassium: 3.9 mmol/L (ref 3.5–5.1)
SODIUM: 133 mmol/L — AB (ref 135–145)

## 2016-01-28 LAB — CBC
HCT: 40.4 % (ref 39.0–52.0)
Hemoglobin: 13.4 g/dL (ref 13.0–17.0)
MCH: 28.5 pg (ref 26.0–34.0)
MCHC: 33.2 g/dL (ref 30.0–36.0)
MCV: 86 fL (ref 78.0–100.0)
PLATELETS: 310 10*3/uL (ref 150–400)
RBC: 4.7 MIL/uL (ref 4.22–5.81)
RDW: 13.4 % (ref 11.5–15.5)
WBC: 10.2 10*3/uL (ref 4.0–10.5)

## 2016-01-28 LAB — URINE MICROSCOPIC-ADD ON
Bacteria, UA: NONE SEEN
RBC / HPF: NONE SEEN RBC/hpf (ref 0–5)
Squamous Epithelial / LPF: NONE SEEN

## 2016-01-28 NOTE — ED Notes (Signed)
Dr. Glick at bedside.  

## 2016-01-28 NOTE — ED Notes (Signed)
Pt states yesterday morning he started feeling weakness in both lower legs and noticed that his speech was a little slurred. Pt states throughout the day his speech has gotten worse. Pt states he had a stroke 5 weeks ago. Pt denies any headache, dizziness, or blurred vision. Pt states while walking he feels "wobbing" since Sunday. Pt has equal grip strengths, no drift noted in arms or legs.

## 2016-01-28 NOTE — ED Notes (Addendum)
Pts girlfriend states that since Sunday he started coughing and gagging every time he would try to drink or eat something. She states that this is not normal for him. Pt states that since his leg surgery in February he would sometimes cough when he drank something.
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
## 2016-01-29 ENCOUNTER — Encounter (HOSPITAL_COMMUNITY): Payer: Self-pay | Admitting: Internal Medicine

## 2016-01-29 ENCOUNTER — Observation Stay (HOSPITAL_COMMUNITY): Payer: BLUE CROSS/BLUE SHIELD

## 2016-01-29 ENCOUNTER — Emergency Department (HOSPITAL_COMMUNITY): Payer: BLUE CROSS/BLUE SHIELD

## 2016-01-29 DIAGNOSIS — Z7984 Long term (current) use of oral hypoglycemic drugs: Secondary | ICD-10-CM | POA: Diagnosis not present

## 2016-01-29 DIAGNOSIS — Z79899 Other long term (current) drug therapy: Secondary | ICD-10-CM | POA: Diagnosis not present

## 2016-01-29 DIAGNOSIS — I252 Old myocardial infarction: Secondary | ICD-10-CM | POA: Diagnosis not present

## 2016-01-29 DIAGNOSIS — I2510 Atherosclerotic heart disease of native coronary artery without angina pectoris: Secondary | ICD-10-CM | POA: Diagnosis present

## 2016-01-29 DIAGNOSIS — I639 Cerebral infarction, unspecified: Secondary | ICD-10-CM | POA: Diagnosis present

## 2016-01-29 DIAGNOSIS — Z794 Long term (current) use of insulin: Secondary | ICD-10-CM | POA: Diagnosis not present

## 2016-01-29 DIAGNOSIS — E1142 Type 2 diabetes mellitus with diabetic polyneuropathy: Secondary | ICD-10-CM | POA: Diagnosis present

## 2016-01-29 DIAGNOSIS — Z85828 Personal history of other malignant neoplasm of skin: Secondary | ICD-10-CM | POA: Diagnosis not present

## 2016-01-29 DIAGNOSIS — I480 Paroxysmal atrial fibrillation: Secondary | ICD-10-CM | POA: Diagnosis present

## 2016-01-29 DIAGNOSIS — R471 Dysarthria and anarthria: Secondary | ICD-10-CM | POA: Diagnosis present

## 2016-01-29 DIAGNOSIS — Z951 Presence of aortocoronary bypass graft: Secondary | ICD-10-CM | POA: Diagnosis not present

## 2016-01-29 DIAGNOSIS — I10 Essential (primary) hypertension: Secondary | ICD-10-CM | POA: Diagnosis present

## 2016-01-29 DIAGNOSIS — G8929 Other chronic pain: Secondary | ICD-10-CM | POA: Diagnosis present

## 2016-01-29 DIAGNOSIS — I6350 Cerebral infarction due to unspecified occlusion or stenosis of unspecified cerebral artery: Secondary | ICD-10-CM

## 2016-01-29 DIAGNOSIS — M25511 Pain in right shoulder: Secondary | ICD-10-CM | POA: Diagnosis present

## 2016-01-29 DIAGNOSIS — Z87891 Personal history of nicotine dependence: Secondary | ICD-10-CM | POA: Diagnosis not present

## 2016-01-29 DIAGNOSIS — G4730 Sleep apnea, unspecified: Secondary | ICD-10-CM | POA: Diagnosis present

## 2016-01-29 DIAGNOSIS — I6789 Other cerebrovascular disease: Secondary | ICD-10-CM | POA: Diagnosis not present

## 2016-01-29 DIAGNOSIS — E781 Pure hyperglyceridemia: Secondary | ICD-10-CM | POA: Diagnosis present

## 2016-01-29 DIAGNOSIS — Z7982 Long term (current) use of aspirin: Secondary | ICD-10-CM | POA: Diagnosis not present

## 2016-01-29 DIAGNOSIS — E785 Hyperlipidemia, unspecified: Secondary | ICD-10-CM | POA: Diagnosis present

## 2016-01-29 DIAGNOSIS — Z7902 Long term (current) use of antithrombotics/antiplatelets: Secondary | ICD-10-CM | POA: Diagnosis not present

## 2016-01-29 DIAGNOSIS — I69320 Aphasia following cerebral infarction: Secondary | ICD-10-CM | POA: Diagnosis not present

## 2016-01-29 DIAGNOSIS — I739 Peripheral vascular disease, unspecified: Secondary | ICD-10-CM | POA: Diagnosis present

## 2016-01-29 DIAGNOSIS — Z6833 Body mass index (BMI) 33.0-33.9, adult: Secondary | ICD-10-CM | POA: Diagnosis not present

## 2016-01-29 DIAGNOSIS — E1151 Type 2 diabetes mellitus with diabetic peripheral angiopathy without gangrene: Secondary | ICD-10-CM | POA: Diagnosis present

## 2016-01-29 DIAGNOSIS — I63532 Cerebral infarction due to unspecified occlusion or stenosis of left posterior cerebral artery: Secondary | ICD-10-CM | POA: Diagnosis not present

## 2016-01-29 DIAGNOSIS — I651 Occlusion and stenosis of basilar artery: Secondary | ICD-10-CM | POA: Insufficient documentation

## 2016-01-29 DIAGNOSIS — M25512 Pain in left shoulder: Secondary | ICD-10-CM | POA: Diagnosis present

## 2016-01-29 DIAGNOSIS — E669 Obesity, unspecified: Secondary | ICD-10-CM | POA: Diagnosis present

## 2016-01-29 DIAGNOSIS — E1165 Type 2 diabetes mellitus with hyperglycemia: Secondary | ICD-10-CM | POA: Diagnosis present

## 2016-01-29 LAB — CBC
HCT: 39.9 % (ref 39.0–52.0)
Hemoglobin: 12.9 g/dL — ABNORMAL LOW (ref 13.0–17.0)
MCH: 27.9 pg (ref 26.0–34.0)
MCHC: 32.3 g/dL (ref 30.0–36.0)
MCV: 86.4 fL (ref 78.0–100.0)
Platelets: 271 10*3/uL (ref 150–400)
RBC: 4.62 MIL/uL (ref 4.22–5.81)
RDW: 13.6 % (ref 11.5–15.5)
WBC: 9.4 10*3/uL (ref 4.0–10.5)

## 2016-01-29 LAB — GLUCOSE, CAPILLARY
GLUCOSE-CAPILLARY: 370 mg/dL — AB (ref 65–99)
GLUCOSE-CAPILLARY: 293 mg/dL — AB (ref 65–99)
GLUCOSE-CAPILLARY: 217 mg/dL — AB (ref 65–99)
Glucose-Capillary: 319 mg/dL — ABNORMAL HIGH (ref 65–99)
Glucose-Capillary: 95 mg/dL (ref 65–99)
Glucose-Capillary: 231 mg/dL — ABNORMAL HIGH (ref 65–99)

## 2016-01-29 LAB — COMPREHENSIVE METABOLIC PANEL
ALBUMIN: 3.4 g/dL — AB (ref 3.5–5.0)
ALT: 19 U/L (ref 17–63)
ANION GAP: 9 (ref 5–15)
AST: 17 U/L (ref 15–41)
Alkaline Phosphatase: 74 U/L (ref 38–126)
BUN: 17 mg/dL (ref 6–20)
CHLORIDE: 102 mmol/L (ref 101–111)
CO2: 25 mmol/L (ref 22–32)
Calcium: 9.4 mg/dL (ref 8.9–10.3)
Creatinine, Ser: 0.95 mg/dL (ref 0.61–1.24)
GFR calc Af Amer: >60 mL/min (ref >60)
GFR calc non Af Amer: >60 mL/min (ref >60)
GLUCOSE: 357 mg/dL — AB (ref 65–99)
POTASSIUM: 4.4 mmol/L (ref 3.5–5.1)
SODIUM: 136 mmol/L (ref 135–145)
Total Bilirubin: 0.7 mg/dL (ref 0.3–1.2)
Total Protein: 6.5 g/dL (ref 6.5–8.1)

## 2016-01-29 LAB — RAPID URINE DRUG SCREEN, HOSP PERFORMED
AMPHETAMINES: NOT DETECTED
BARBITURATES: NOT DETECTED
BENZODIAZEPINES: NOT DETECTED
Cocaine: NOT DETECTED
Opiates: NOT DETECTED
TETRAHYDROCANNABINOL: NOT DETECTED

## 2016-01-29 LAB — HEMOGLOBIN A1C
Hgb A1c MFr Bld: 11.5 % — ABNORMAL HIGH (ref 4.8–5.6)
MEAN PLASMA GLUCOSE: 283 mg/dL

## 2016-01-29 LAB — LIPID PANEL
Cholesterol: 523 mg/dL — ABNORMAL HIGH (ref 0–200)
LDL CALC: UNDETERMINED mg/dL (ref 0–99)
Triglycerides: 1925 mg/dL — ABNORMAL HIGH (ref <150)
VLDL: UNDETERMINED mg/dL (ref 0–40)

## 2016-01-29 LAB — TROPONIN I: Troponin I: <0.03 ng/mL (ref <0.03)

## 2016-01-29 LAB — TSH: TSH: 2.497 u[IU]/mL (ref 0.350–4.500)

## 2016-01-29 MED ORDER — STROKE: EARLY STAGES OF RECOVERY BOOK
Freq: Once | Status: AC
  Administered 2016-01-29: 04:00:00
  Filled 2016-01-29: qty 1

## 2016-01-29 MED ORDER — CLOPIDOGREL BISULFATE 75 MG PO TABS
75.0000 mg | ORAL_TABLET | Freq: Every day | ORAL | Status: DC
  Administered 2016-01-29: 75 mg via ORAL
  Filled 2016-01-29: qty 1

## 2016-01-29 MED ORDER — INSULIN ASPART 100 UNIT/ML ~~LOC~~ SOLN
0.0000 [IU] | Freq: Every day | SUBCUTANEOUS | Status: DC
  Administered 2016-01-30 (×2): 3 [IU] via SUBCUTANEOUS

## 2016-01-29 MED ORDER — INSULIN DETEMIR 100 UNIT/ML FLEXPEN: 75.0000 [IU] | PEN_INJECTOR | Freq: Two times a day (BID) | SUBCUTANEOUS | Status: DC

## 2016-01-29 MED ORDER — LISINOPRIL 20 MG PO TABS
20.0000 mg | ORAL_TABLET | Freq: Every day | ORAL | Status: DC
  Administered 2016-01-29 – 2016-01-31 (×2): 20 mg via ORAL
  Filled 2016-01-29 (×3): qty 1

## 2016-01-29 MED ORDER — HEPARIN (PORCINE) IN NACL 100-0.45 UNIT/ML-% IJ SOLN
1450.0000 [IU]/h | INTRAMUSCULAR | Status: DC
  Administered 2016-01-29: 1200 [IU]/h via INTRAVENOUS
  Filled 2016-01-29 (×3): qty 250

## 2016-01-29 MED ORDER — FENOFIBRATE 160 MG PO TABS
160.0000 mg | ORAL_TABLET | Freq: Every day | ORAL | Status: DC
  Administered 2016-01-29 – 2016-01-31 (×2): 160 mg via ORAL
  Filled 2016-01-29 (×3): qty 1

## 2016-01-29 MED ORDER — IOPAMIDOL (ISOVUE-370) INJECTION 76%
50.0000 mL | Freq: Once | INTRAVENOUS | Status: AC | PRN
  Administered 2016-01-29: 50 mL via INTRAVENOUS

## 2016-01-29 MED ORDER — ASPIRIN 325 MG PO TABS
325.0000 mg | ORAL_TABLET | Freq: Every day | ORAL | Status: DC
  Administered 2016-01-29: 325 mg via ORAL
  Filled 2016-01-29: qty 1

## 2016-01-29 MED ORDER — INSULIN DETEMIR 100 UNIT/ML ~~LOC~~ SOLN
75.0000 [IU] | Freq: Two times a day (BID) | SUBCUTANEOUS | Status: DC
  Administered 2016-01-29 (×2): 75 [IU] via SUBCUTANEOUS
  Filled 2016-01-29 (×4): qty 0.75

## 2016-01-29 MED ORDER — GABAPENTIN 300 MG PO CAPS
300.0000 mg | ORAL_CAPSULE | Freq: Every day | ORAL | Status: DC
  Administered 2016-01-29 – 2016-01-30 (×2): 300 mg via ORAL
  Filled 2016-01-29 (×2): qty 1

## 2016-01-29 MED ORDER — INSULIN ASPART 100 UNIT/ML ~~LOC~~ SOLN
0.0000 [IU] | Freq: Three times a day (TID) | SUBCUTANEOUS | Status: DC
  Administered 2016-01-31: 5 [IU] via SUBCUTANEOUS
  Administered 2016-01-31: 2 [IU] via SUBCUTANEOUS
  Administered 2016-01-31: 5 [IU] via SUBCUTANEOUS

## 2016-01-29 MED ORDER — INSULIN ASPART 100 UNIT/ML ~~LOC~~ SOLN
0.0000 [IU] | Freq: Three times a day (TID) | SUBCUTANEOUS | Status: DC
  Administered 2016-01-29: 7 [IU] via SUBCUTANEOUS
  Administered 2016-01-29: 9 [IU] via SUBCUTANEOUS

## 2016-01-29 MED ORDER — GABAPENTIN 300 MG PO CAPS: 300.0000 mg | ORAL_CAPSULE | Freq: Two times a day (BID) | ORAL | Status: DC

## 2016-01-29 MED ORDER — ATORVASTATIN CALCIUM 80 MG PO TABS
80.0000 mg | ORAL_TABLET | Freq: Every day | ORAL | Status: DC
Start: 2016-01-29 — End: 2016-01-31
  Administered 2016-01-29 – 2016-01-31 (×3): 80 mg via ORAL
  Filled 2016-01-29 (×4): qty 1

## 2016-01-29 MED ORDER — INSULIN ASPART 100 UNIT/ML ~~LOC~~ SOLN
35.0000 [IU] | Freq: Three times a day (TID) | SUBCUTANEOUS | Status: DC
  Administered 2016-01-29 – 2016-01-30 (×3): 35 [IU] via SUBCUTANEOUS

## 2016-01-29 MED ORDER — ENOXAPARIN SODIUM 40 MG/0.4ML ~~LOC~~ SOLN: 40.0000 mg | SUBCUTANEOUS | Status: DC

## 2016-01-29 MED ORDER — METOPROLOL SUCCINATE ER 25 MG PO TB24
25.0000 mg | ORAL_TABLET | Freq: Every day | ORAL | Status: DC
Start: 2016-01-29 — End: 2016-01-31
  Administered 2016-01-29 – 2016-01-31 (×2): 25 mg via ORAL
  Filled 2016-01-29 (×3): qty 1

## 2016-01-29 MED ORDER — ENOXAPARIN SODIUM 40 MG/0.4ML ~~LOC~~ SOLN
40.0000 mg | SUBCUTANEOUS | Status: DC
  Administered 2016-01-29: 40 mg via SUBCUTANEOUS
  Filled 2016-01-29: qty 0.4

## 2016-01-29 MED ORDER — GABAPENTIN 600 MG PO TABS
600.0000 mg | ORAL_TABLET | Freq: Every day | ORAL | Status: DC
  Administered 2016-01-29 – 2016-01-31 (×2): 600 mg via ORAL
  Filled 2016-01-29 (×3): qty 1

## 2016-01-29 MED ORDER — ASPIRIN 300 MG RE SUPP: 300.0000 mg | Freq: Every day | RECTAL | Status: DC

## 2016-01-29 MED ORDER — SODIUM CHLORIDE 0.9 % IV SOLN
INTRAVENOUS | Status: DC
  Administered 2016-01-29: 1000 mL via INTRAVENOUS

## 2016-01-29 NOTE — Progress Notes (Signed)
Shift event: This NP paged at shift change regarding Mr. Condello by attending, Dr. Algis Liming. Apparently, around shift change, pt began to have worsening stroke symptoms with increased weakness of RLE (I can't walk now), increased facial droop and slurred speech. Pt was getting emergency CTA. CTA showed no change since MRI. However, this NP called Dr. Nicole Kindred of neuro who reviewed the CTA and saw the pt. He agreed with the worsening of sx and advised d/c Plavix and ASA and place pt on Heparin drip, low dose, no bolus. Orders placed to that affect and RN knows plan.  Dr. Hal Hope of [facility] notified of pt change and new plan as NP is not in the house on [facility]. KJKG, NP [facility]

## 2016-01-29 NOTE — ED Notes (Signed)
Patient transported to MRI 

## 2016-01-29 NOTE — Progress Notes (Signed)
OT Cancellation Note  Patient Details Name: [name] MRN: [id number] DOB: March 21, 1956   Cancelled Treatment:    Reason Eval/Treat Not Completed: Patient at procedure or test/ unavailable  Vonita Moss   OTR/L Pager: [phone] Office: [phone] .  01/29/2016, 2:44 PM

## 2016-01-29 NOTE — Progress Notes (Addendum)
PROGRESS NOTE  [name]  [id number] DOB: April 27, 1956  DOA: 01/28/2016 PCP: No PCP Per Patient  Primary Cardiologist: Dr. Quay Burow.  Brief Narrative:  60 y.o. male with history of stroke in March 2017, CAD status post CABG, DM type 2, HLD, PAD status post femoro/tibial bypass in February 2017, HTN presents to the ER because of persistent difficulty with expressive aphasia and dizziness. Patient's symptoms started on 01/27/2016 when he woke up in the morning. CT of the head did not show anything acute. MRI of the brain shows left pontine infarct. Neurology consulted. Cardiology consulted for evaluation of new onset A. fib.   Assessment & Plan:   Principal Problem:   Left pontine stroke ([place]) Active Problems:   PVD (peripheral vascular disease) ([place])   Essential hypertension   Coronary artery disease, history of CABG   Hypertriglyceridemia   Uncontrolled diabetes mellitus with hyperglycemia, with long-term current use of insulin ([facility])   Stroke (cerebrum) ([facility])   Acute left brain stroke - Resultant dysarthria and facial asymmetry - CT head 7/11: No acute intracranial pathology. - MRI brain 01/29/16: Acute small left pontine infarct. Old right pontine infarcts. 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 Request repeat 2-D echo. - Carotid Dopplers: 10/11/15: 1-39 percent stenosis involving the right internal carotid artery and the left internal carotid artery. - LDL: Unable to calculate due to hypertriglyceridemia - A1c: 10.3 on 10/11/15. Follow repeat A1c. - Patient was on aspirin 325 MG daily and Plavix 75 MG daily prior to admission,  continue same. Concern for new onset A. fib-pending cardiology and neurology follow-up, may consider anticoagulation with a NOAC. - Etiology: ? Embolic. - PT, OT and ST evaluation.  Type II DM, uncontrolled with peripheral neuropathy - A1c: 10.3 on 10/11/15. Follow repeat A1c. Patient on high doses of Levemir and aspart insulin. SSI added. Monitor closely. Will need further adjustment.  Essential hypertension - Allowing for permissive hypertension given recent stroke. HCTZ held. Lisinopril and metoprolol continue.  Dyslipidemia - Cholesterol 523, triglycerides 1925, unable to calculate HDL, LDL or VLDL. - Continue atorvastatin 80 mg daily and fenofibrate 160 daily. May need further titration.  CAD status post CABG - Asymptomatic of chest pain  PAD status post left lower extremity bypass February 2017 - No acute issues. If anticoagulation started for suspected A. fib, we will need to stop aspirin and Plavix.  Suspected new onset A. Fib, paroxysmal - Requested cardiology consultation. Currently in sinus rhythm.   DVT prophylaxis: lovenox Code Status: Full Family Communication: Discussed with patient's male friend at bedside. Disposition Plan: DC home when medically stable.   Consultants:   Neurology  Cardiology-pending  Procedures:   None  Antimicrobials:   None    Subjective: States that his speech feels slightly better. Denies headache, chest pain, palpitations or dyspnea. No asymmetric limb weakness or numbness.  Objective:  Filed Vitals:   01/29/16 0800 01/29/16 0900 01/29/16 1008 01/29/16 1100  BP: 138/113 155/79 142/77 130/70  Pulse: 86 72  72  Temp:      TempSrc:      Resp: 20 16  16   Height:      Weight:      SpO2: 98% 98%  98%    Intake/Output  Summary (Last 24 hours) at 01/29/16 1251 Last data filed at 01/29/16 1000  Gross per 24 hour  Intake    240 ml  Output      0 ml  Net    240 ml   Filed Weights   01/28/16 1933 01/29/16 0327  Weight:  107.956 kg (238 lb) 108.863 kg (240 lb)    Examination:  General exam: Pleasant middle-aged male sitting up comfortably in chair this morning. Respiratory system: Clear to auscultation. Respiratory effort normal. Cardiovascular system: S1 & S2 heard, RRR. No JVD, murmurs, rubs, gallops or clicks. No pedal edema. Telemetry: Was in A. fib/A flutter during initial part of admission with controlled ventricular rate. Anteverted to sinus rhythm. Gastrointestinal system: Abdomen is nondistended, soft and nontender. No organomegaly or masses felt. Normal bowel sounds heard. Central nervous system: Alert and oriented. Dysarthria +. Mild right lower facial weakness (diminished right nasolabial fold). Extremities: Symmetric 5 x 5 power. No pronator drift. Surgical scar left mid thigh. Skin: No rashes, lesions or ulcers Psychiatry: Judgement and insight appear normal. Mood & affect appropriate.     Data Reviewed: I have personally reviewed following labs and imaging studies  CBC:  Recent Labs Lab 01/28/16 1951 01/29/16 0540  WBC 10.2 9.4  HGB 13.4 12.9*  HCT 40.4 39.9  MCV 86.0 86.4  PLT 310 [id number]   Basic Metabolic Panel:  Recent Labs Lab 01/28/16 1951 01/29/16 0540  NA 133* 136  K 3.9 4.4  CL 100* 102  CO2 23 25  GLUCOSE 390* 357*  BUN 16 17  CREATININE 0.95 0.95  CALCIUM 9.8 9.4   GFR: Estimated Creatinine Clearance: 102.4 mL/min (by C-G formula based on Cr of 0.95). Liver Function Tests:  Recent Labs Lab 01/29/16 0540  AST 17  ALT 19  ALKPHOS 74  BILITOT 0.7  PROT 6.5  ALBUMIN 3.4*   No results for input(s): LIPASE, AMYLASE in the last 168 hours. No results for input(s): AMMONIA in the last 168 hours. Coagulation Profile: No results for input(s): INR, PROTIME in the last 168 hours. Cardiac Enzymes: No results for input(s): CKTOTAL, CKMB, CKMBINDEX, TROPONINI in the last 168 hours. BNP (last 3 results) No results for input(s): PROBNP in the last 8760  hours. HbA1C: No results for input(s): HGBA1C in the last 72 hours. CBG:  Recent Labs Lab 01/29/16 0551 01/29/16 1018 01/29/16 1153  GLUCAP 370* 319* 293*   Lipid Profile:  Recent Labs  01/29/16 0538  CHOL 523*  HDL NOT REPORTED DUE TO HIGH TRIGLYCERIDES  LDLCALC UNABLE TO CALCULATE IF TRIGLYCERIDE OVER 400 mg/dL  TRIG 1925*  CHOLHDL NOT REPORTED DUE TO HIGH TRIGLYCERIDES   Thyroid Function Tests: No results for input(s): TSH, T4TOTAL, FREET4, T3FREE, THYROIDAB in the last 72 hours. Anemia Panel: No results for input(s): VITAMINB12, FOLATE, FERRITIN, TIBC, IRON, RETICCTPCT in the last 72 hours.  Sepsis Labs: No results for input(s): PROCALCITON, LATICACIDVEN in the last 168 hours.  No results found for this or any previous visit (from the past 240 hour(s)).       Radiology Studies: Ct Head Wo Contrast  01/28/2016  CLINICAL DATA:  Slurred speech, lethargic for 3 days EXAM: CT HEAD WITHOUT CONTRAST TECHNIQUE: Contiguous axial images were obtained from the base of the skull through the vertex without intravenous contrast. COMPARISON:  MR brain 10/11/2015 FINDINGS: There is no evidence of mass effect, midline shift or extra-axial fluid collections. There is no evidence of a space-occupying lesion or intracranial hemorrhage. There is no  evidence of a cortical-based area of acute infarction. There is a small lacunar infarct along the roof of the left lateral ventricle. The ventricles and sulci are appropriate for the patient's age. The basal cisterns are patent. Visualized portions of the orbits are unremarkable. The visualized portions of the paranasal sinuses and mastoid air cells are unremarkable. The osseous structures are unremarkable. IMPRESSION: No acute intracranial pathology. Electronically Signed   By: Kathreen Devoid   On: 01/28/2016 20:31   Mr Brain Wo Contrast  01/29/2016  CLINICAL DATA:  Lower extremity weakness and slurred speech beginning yesterday. History of stroke  5 weeks ago, with known RIGHT vertebral artery occlusion. Aphasia and ataxia. History of hypertension, hyperlipidemia, diabetes. EXAM: MRI HEAD WITHOUT CONTRAST TECHNIQUE: Multiplanar, multiecho pulse sequences of the brain and surrounding structures were obtained without intravenous contrast. COMPARISON:  CT HEAD January 28, 2016 and MRI of the head October 11, 2015 FINDINGS: INTRACRANIAL CONTENTS: 3 subcentimeter foci of reduced diffusion LEFT ventral pons with low ADC values. No susceptibility artifact to suggest hemorrhage. The ventricles and sulci are normal for patient's age. Scattered subcentimeter supratentorial white matter FLAIR T2 hyperintensities compatible with chronic small vessel ischemic disease, a few which demonstrate underlying cystic changes. RIGHT ventral pontine old infarcts. Old bilateral caudate head lacunar infarcts. No suspicious parenchymal signal, masses or mass effect. No abnormal extra-axial fluid collections. No extra-axial masses though, contrast enhanced sequences would be more sensitive. Loss of normal RIGHT vertebral artery flow void. Irregular signal central basilar artery was present previously suggesting thrombus or dissection . ORBITS: The included ocular globes and orbital contents are non-suspicious. SINUSES: The mastoid air-cells and included paranasal sinuses are well-aerated. SKULL/SOFT TISSUES: No abnormal sellar expansion. No suspicious calvarial bone marrow signal. Craniocervical junction maintained. IMPRESSION: Acute small LEFT pontine infarcts. Old RIGHT pontine infarcts. 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 Stable chronic changes including moderate chronic small vessel ischemic disease and old caudate lacunar infarcts. Electronically Signed   By: Elon Alas M.D.   On: 01/29/2016 01:11   [name] Head/brain Wo Cm  01/29/2016  CLINICAL DATA:  60 year old male with acute left brainstem infarct discovered on  MRI for lower extremity weakness and slurred speech. Known right vertebral artery occlusion. Initial encounter. EXAM: MRA HEAD WITHOUT CONTRAST TECHNIQUE: Angiographic images of the Circle of Willis were obtained using MRA technique without intravenous contrast. COMPARISON:  Brain MRI 01/29/2016. Brain MRI and intracranial MRA 10/11/2015 FINDINGS: Continued absence of antegrade flow in the distal right vertebral artery. Preserved antegrade flow in the distal left vertebral artery, but increased left V4 irregularity and mild stenosis (series 3, image 119). This is just proximal to the left PICA origin which remains patent. The left vertebral supplies the basilar. Multifocal basilar artery irregularity again noted. Suspect progressed chronic stenosis at the distal 3rd basilar artery, moderate to severe (series 306, image 9). AICA origins appear to remain patent. SCA and PCA origins are stable and patent. Left PCA branches are stable. Suggestion of increased and moderate to severe stenosis in the right distal PCA superior division today. Antegrade flow in both ICA siphons is stable. Both ophthalmic artery origins remain patent. No siphon stenosis. Carotid termini are stable and patent. Mild irregularity at the left ICA terminus. The left ACA A1 segment is non dominant. MCA and ACA origins otherwise within normal limits. Anterior communicating artery and visualized bilateral ACA branches are stable and within normal limits. MCA M1 segments and bifurcations are stable with mild irregularity.  Visualized bilateral MCA branches are stable, no major branch occlusion. IMPRESSION: 1. 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 2. Chronic distal right vertebral artery occlusion. Progressed mild atherosclerotic stenosis in the distal left vertebral artery. 3. Stable anterior circulation with comparatively mild  atherosclerosis. Electronically Signed   By: Genevie Ann M.D.   On: 01/29/2016 08:30        Scheduled Meds: . aspirin  300 mg Rectal Daily   Or  . aspirin  325 mg Oral Daily  . atorvastatin  80 mg Oral q1800  . clopidogrel  75 mg Oral Daily  . enoxaparin (LOVENOX) injection  40 mg Subcutaneous Q24H  . fenofibrate  160 mg Oral Daily  . gabapentin  300 mg Oral QHS  . gabapentin  600 mg Oral Daily  . insulin aspart  0-9 Units Subcutaneous TID WC  . insulin aspart  35 Units Subcutaneous TID AC  . insulin detemir  75 Units Subcutaneous BID  . lisinopril  20 mg Oral Daily  . metoprolol succinate  25 mg Oral Daily   Continuous Infusions: . sodium chloride 1,000 mL (01/29/16 0345)     LOS: 0 days    Time spent: 45 minutes.    [facility], MD [facility] Pager [phone]  If 7PM-7AM, please contact night-coverage [email or web address] Password [facility] 01/29/2016, 12:51 PM

## 2016-01-29 NOTE — Progress Notes (Signed)
RN spoke with [facility], Lionel December, which is consulting neurologist on patients treatment plan going forward. [facility] informed of patient assessment and modified NIH. Patient is having right facial droop, slurred/garbled speech, RLE decreased sensation; see flow sheet. Order for heparin drip with no bolus will be entered per MD. RN will continue to monitor.

## 2016-01-29 NOTE — Progress Notes (Signed)
Pt to CT 1 stat with Mertie Moores NT

## 2016-01-29 NOTE — Progress Notes (Signed)
[name] is demonstrating worsening of clinical status with worsening of dysarthria and dysphagia as well as right facial droop and right-sided weakness, not present on examination by me at 2:00 AM today. Repeat CT scan of his head showed no acute abnormality. CT angiogram showed right vertebral artery occlusion at the origin and patent left vertebral artery in the neck can 50% stenosis at the foramen magnum. 70% stenosis of distal basilar artery was noted. Patient has known posterior circulation disease with evidence of worsening since March 2017. No evidence of thrombus was reported. Patient is currently on aspirin and Plavix, and is demonstrating progressive worsening of deficits, in addition to acute left pontine infarct seen on MRI earlier today.  Recommend discontinuing aspirin and Plavix at this point and initiating anticoagulation with IV heparin, low-dose with no bolus. Patient is scheduled for catheter angiogram on 01/30/2016.  Rush Farmer M.D. [facility] [phone]

## 2016-01-29 NOTE — ED Notes (Signed)
Dr. Stewart at the bedside. 

## 2016-01-29 NOTE — Progress Notes (Signed)
Pt has had a n acute change in mental status. This morning he was able to ambulate to bathroom, with little to no help. Pt had drooping in right side of face, mouth and eye. Pt was unable to ambulate without increased difficulty. He also had a hard time swallowing. This nurse was concerned due to pt getting choked.

## 2016-01-29 NOTE — Evaluation (Signed)
Physical Therapy Evaluation Patient Details Name: [name] MRN: [id number] DOB: 1956/04/21 Today's Date: 01/29/2016   History of Present Illness  [name] is a 60 y.o. male with history of stroke in March 2017, CAD status post CABG, diabetes mellitus type 2, hypertriglyceridemia, peripheral vascular disease status post femoro/tibial bypass in February 2017. Pt was admitted to the hospital due to persistent difficulty with expressive aphasia and dizziness. MRI of the brain shows left pontine infarct.  Clinical Impression  Pt was admitted with the above. Pt continues to have difficultly with expressive aphasia. Strength is WFL but does have loss of sensation on L LE below the knee. Pt denies dizziness or change in vision. Pt ambulated 300 with min guard due to occasional staggering to the right.  He was able to complete 3+2 stairs with R handrail only, min guard. Pt states that he does not feel like he is back at baseline, but states he is better than back in March. PT recommended outpatient neuro therapy and pt agreed, stating that he went there before and wants to work on balance. Pt lives with his significant other and the patient's daughter is able to take patient to OP therapy. Continue acute rehab follow.       Follow Up Recommendations Outpatient PT;Supervision - Intermittent (OP Neuro)    Equipment Recommendations  None recommended by PT    Recommendations for Other Services Rehab consult     Precautions / Restrictions Precautions Precautions: Fall Restrictions Weight Bearing Restrictions: No      Mobility  Bed Mobility               General bed mobility comments: Pt found sitting in recliner.  Transfers Overall transfer level: Needs assistance Equipment used: None Transfers: Sit to/from Stand Sit to Stand: Supervision            Ambulation/Gait Ambulation/Gait assistance: Min guard Ambulation Distance (Feet): 300 Feet Assistive device: None Gait  Pattern/deviations: Step-through pattern;Staggering right   Gait velocity interpretation: at or above normal speed for age/gender General Gait Details: Pt ocassionally staggering to the right but able to self-correct.  Stairs Stairs: Yes Stairs assistance: Min guard Stair Management: One rail Right;Alternating pattern Number of Stairs: 3 (+2 x 4) General stair comments: Pt initially used two rails but was able to use just the right when prompted.  Wheelchair Mobility    Modified Rankin (Stroke Patients Only) Modified Rankin (Stroke Patients Only) Pre-Morbid Rankin Score: No symptoms Modified Rankin: Moderate disability     Balance Overall balance assessment: Needs assistance Sitting-balance support: Feet supported;No upper extremity supported Sitting balance-Leahy Scale: Good     Standing balance support: No upper extremity supported Standing balance-Leahy Scale: Fair                   Standardized Balance Assessment Standardized Balance Assessment : Dynamic Gait Index   Dynamic Gait Index Level Surface: Mild Impairment Change in Gait Speed: Mild Impairment Gait with Horizontal Head Turns: Mild Impairment Gait with Vertical Head Turns: Mild Impairment Gait and Pivot Turn: Normal Step Over Obstacle: Mild Impairment Step Around Obstacles: Normal Steps: Mild Impairment Total Score: 18       Pertinent Vitals/Pain Pain Assessment: No/denies pain    Home Living Family/patient expects to be discharged to:: Private residence Living Arrangements: Spouse/significant other Available Help at Discharge: Family Type of Home: House Home Access: Stairs to enter Entrance Stairs-Rails: Can reach both;Right;Left Entrance Stairs-Number of Steps: 2 Home Layout: One level Home  Equipment: Grab bars - tub/shower      Prior Function Level of Independence: Independent               Hand Dominance        Extremity/Trunk Assessment   Upper Extremity Assessment:  Overall WFL for tasks assessed           Lower Extremity Assessment: LLE deficits/detail   LLE Deficits / Details: MMT WFL   Cervical / Trunk Assessment: Kyphotic  Communication   Communication: Expressive difficulties  Cognition Arousal/Alertness: Awake/alert Behavior During Therapy: WFL for tasks assessed/performed Overall Cognitive Status: Within Functional Limits for tasks assessed                      General Comments General comments (skin integrity, edema, etc.): Pt denied any change of vision from before the incident. He demonstrated decreased ability to track R to end of range with bilateral eyes. Pt denied diplopia or blurred vision during PT vision test, but shook his head twice as if he were trying to clear his vision.     Exercises        Assessment/Plan    PT Assessment Patient needs continued PT services  PT Diagnosis Difficulty walking;Abnormality of gait;Generalized weakness   PT Problem List Decreased strength;Decreased balance;Decreased mobility;Decreased safety awareness;Impaired sensation;Decreased range of motion;Decreased coordination  PT Treatment Interventions Gait training;Stair training;Functional mobility training;Therapeutic activities;Therapeutic exercise;Balance training;Neuromuscular re-education;Patient/family education   PT Goals (Current goals can be found in the Care Plan section) Acute Rehab PT Goals Patient Stated Goal: Work on speech. PT Goal Formulation: With patient Time For Goal Achievement: 02/05/16 Potential to Achieve Goals: Good Additional Goals Additional Goal #1: Pt will achieve a score of >19 on the dynamic gait index to demonstrate decreased risk of falls.    Frequency Min 4X/week   Barriers to discharge        Co-evaluation               End of Session Equipment Utilized During Treatment: Gait belt Activity Tolerance: Patient tolerated treatment well Patient left: in chair;with call bell/phone within  reach;with family/visitor present Nurse Communication: Mobility status    Functional Assessment Tool Used: clinical judgement Functional Limitation: Mobility: Walking and moving around Mobility: Walking and Moving Around Current Status [id number]): At least 20 percent but less than 40 percent impaired, limited or restricted Mobility: Walking and Moving Around Goal Status [phone]): At least 1 percent but less than 20 percent impaired, limited or restricted    Time: 1354-1420 PT Time Calculation (min) (ACUTE ONLY): 26 min   Charges:   PT Evaluation $PT Eval Moderate Complexity: 1 Procedure PT Treatments $Gait Training: 8-22 mins   PT G Codes:   PT G-Codes **NOT FOR INPATIENT CLASS** Functional Assessment Tool Used: clinical judgement Functional Limitation: Mobility: Walking and moving around Mobility: Walking and Moving Around Current Status [id number]): At least 20 percent but less than 40 percent impaired, limited or restricted Mobility: Walking and Moving Around Goal Status [phone]): At least 1 percent but less than 20 percent impaired, limited or restricted    Daymon Larsen 01/29/2016, 4:11 PM  Tawni Millers, SPT (student physical therapist) [place] [phone]
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
## 2016-01-29 NOTE — Consult Note (Addendum)
Reason for Consult: atrial fib and CVA  Referring Physician: Dr. Algis Liming   PCP:  No PCP Per Patient  Primary Cardiologist:Dr. Burnett Harry is an 60 y.o. male.    Chief Complaint: aphasia- new Stroke admitted 01/29/16    HPI: Asked to see 60 year old male with a history of ischemic heart disease status post MI back in 2002 with bypass grafting x4 at that time. He has had occluded graft to VG->OM, Patent free radial graft and critical distal left anterior descending disease distal to the anastomotic site by cath 2006 and has had EECP in the past. His other problems include remote tobacco abuse, diabetes with peripheral neuropathy, dyslipidemia and hypertension. He also has peripheral vascular occlusive disease. Dr. Adora Fridge stented his left lower extremity several times, the most recent with a Viabahn endoprosthesis with an excellent result. This ultimated closed probably because of his failure to continue his Plavix. He ultimately underwent above-to-below-the-knee bypass grafting by Dr. Ruta Hinds which eventually closed as well. Dr. Oneida Alar recently did Dopplers on him revealing a right ABI of 1.03 and a left of 0.66. His last Myoview performed 02/10/2015, revealed  EF 52% and low risk study, normal. He is otherwise asymptomatic.his lipids are followed by Dr. Chalmers Cater, his endocrinologist. Recent lower extremity Dopplers performed by Dr. Oneida Alar revealed a right ABI of 0.76 and a left of 0.61.  09/05/12 he underwent left superficial femoral to posterior tibial artery bypass using non-reversed right upper extremity vein composite PTFE by Dr. Oneida Alar  In 09/2015 he had acute ischemic stroke MRI of the brain showed an acute lacunar infarct in the right pons with no hemorrhage or mass effect. Occlusion of the distal right vertebral artery proximal to PICA seen. Moderately advanced chronic small vessel disease primarily in the bilateral basal ganglia and cerebral white matter also seen.   Echo then EF 55-60% LA mildly dilated and no source of cardiac emboli.  Pt was discharged on plavix and ASA.   Pt now admitted 01/29/16 early AM with expressive aphasia and dizziness.  MRI reveals multiple small left pontine infarcts.  EKG with lots of artifact - SR no acute changes.  At 0455 AM pt went into a fib rate controlled and converted back to SR at 0520.  Now in SR with PACs and non conducted PACs. Looking back through other hospital stays no a fib noted.     CHA2DS2VASC score of 5 needs anticoagulation, if clear with Neuro.  No blood in his stool or urine, no hx of GI bleed per pt.    He has had some chest pressure over last few days seems worse with deep breath -he thought he was catching a cold.  Improved today.  No troponins drawn.     His girlfriend tells him he stops breathing at night.   Will need sleep study.    Past Medical History  Diagnosis Date  . Hyperlipidemia   . CAD (coronary artery disease)     OV, Dr Harlow Asa, MYOVIEW 5/12 on chart  EKG 10/12 EPIC,  chest x ray 01/07/11 EPIC  . Peripheral vascular disease, unspecified ([place]) 03/2015    PCI to the right popliteal  . Hypertension   . Neuropathy, peripheral (HCC)     both feet  . Carpal tunnel syndrome     peripheral neuropathy  . Myocardial infarction ([place]) 02/2001  . DVT (deep venous thrombosis) (HCC)     hx LLE  .  Type II diabetes mellitus ([place])   . Arthritis     "hx; cleaned it out of both shoulders"  . Skin cancer     "have had them cut or burned off my face" (03/28/2015)  . Chronic shoulder pain     "both"  . History of kidney stones   . Stroke [facility]) 10/10/2015    Past Surgical History  Procedure Laterality Date  . Knee arthroscopy Left X 2  . Carpal tunnel release Right 2000's  . Shoulder arthroscopy Left   . Femoral-tibial bypass graft Left 01/07/11    fem-posterior tibial BPG using reversed left GSV               12/15/11 OK BY DR Ninfa Linden TO CONTINUE ASA AND PLAVIX  . Orif radius & ulna  fractures Left   . Popliteal artery stent Left 2010-2012 X 4  . Carpal tunnel release  12/18/2011    Procedure: CARPAL TUNNEL RELEASE;  Surgeon: Mcarthur Rossetti, MD;  Location: [facility];  Service: Orthopedics;  Laterality: Left;  Left Open Carpal Tunnel Release  . Shoulder arthroscopy Right 12/18/2011  . Shoulder arthroscopy  07/08/2012    Procedure: ARTHROSCOPY SHOULDER;  Surgeon: Mcarthur Rossetti, MD;  Location: [facility];  Service: Orthopedics;  Laterality: Left;  Left Shoulder Arthroscopy with Manipulation and Extensive Debridement  . Cystoscopy  several done in past  . Lithotripsy  several done in past  . Shoulder arthroscopy with rotator cuff repair Left 07/14/2013    Procedure: LEFT SHOULDER ARTHROSCOPY WITH EXTENSIVE DEBRIDEMENT, DISTAL CLAVICLE REPAIR;  Surgeon: Mcarthur Rossetti, MD;  Location: [facility];  Service: Orthopedics;  Laterality: Left;  . Peripheral vascular catheterization N/A 03/28/2015    Procedure: Lower Extremity Angiography;  Surgeon: Lorretta Harp, MD;  Location: [place] [facility];  Service: Cardiovascular;  Laterality: N/A;  . Peripheral vascular catheterization Right 03/28/2015    Procedure: Peripheral Vascular Atherectomy;  Surgeon: Lorretta Harp, MD;  Location: [place] [facility];  Service: Cardiovascular;  Laterality: Right;  popliteal;   . Appendectomy  1977  . Laparoscopic cholecystectomy  1990's  . Femoropopliteal thrombectomy / embolectomy  ~ 2010  . Skin cancer excision      "left side of my forehead"  . Coronary artery bypass graft  2002    CABG X 4  . Cardiac catheterization  2002       . Fracture surgery    . Coronary angioplasty    . Peripheral vascular catheterization N/A 08/23/2015    Procedure: Abdominal Aortogram;  Surgeon: Elam Dutch, MD;  Location: [place] [facility];  Service: Cardiovascular;  Laterality: N/A;  . Femoral-tibial bypass graft Left 09/06/2015    Procedure: LEFT FEMORAL-POSTERIOR TIBIAL ARTERY BYPASS GRAFT WITH  COMPOSITE PTFE AND RIGHT ARM VEIN;  Surgeon: Elam Dutch, MD;  Location: [place];  Service: Vascular;  Laterality: Left;  . Vein harvest Right 09/06/2015    Procedure: RIGHT ARM VEIN HARVEST;  Surgeon: Elam Dutch, MD;  Location[facility] OR;  Service: Vascular;  Laterality: Right;    Family History  Problem Relation Age of Onset  . Cancer Mother     Breast and Brain tumor  . Cancer Father     Blood vessel tumor   Social History:  reports that he has never smoked. He has never used smokeless tobacco. He reports that he does not drink alcohol or use illicit drugs.  Allergies: No Known Allergies  OUTPATIENT MEDICATIONS: No current facility-administered medications  on file prior to encounter.   Current Outpatient Prescriptions on File Prior to Encounter  Medication Sig Dispense Refill  . aspirin 325 MG tablet Take 325 mg by mouth daily with breakfast.     . atorvastatin (LIPITOR) 80 MG tablet Take 1 tablet (80 mg total) by mouth daily. 30 tablet 0  . clopidogrel (PLAVIX) 75 MG tablet TAKE 1 TABLET BY MOUTH EVERY DAY 90 tablet 3  . fenofibrate (TRICOR) 145 MG tablet Take 1 tablet (145 mg total) by mouth daily. 90 tablet 3  . gabapentin (NEURONTIN) 300 MG capsule Take 300-600 mg by mouth 2 (two) times daily. Take 683m in the morning and 3087min the evening.    . insulin aspart (NOVOLOG) 100 UNIT/ML injection Inject 40 Units into the skin 3 (three) times daily before meals. (Patient taking differently: Inject 35 Units into the skin 3 (three) times daily before meals. ) 10 mL 11  . LEVEMIR FLEXTOUCH 100 UNIT/ML Pen Inject 80 Units into the skin 2 (two) times daily. Reported on 08/21/2015 (Patient taking differently: Inject 75 Units into the skin 2 (two) times daily. Reported on 08/21/2015) 15 mL 4  . lisinopril-hydrochlorothiazide (PRINZIDE,ZESTORETIC) 20-12.5 MG per tablet Take 1 tablet by mouth daily with breakfast.     . metFORMIN (GLUCOPHAGE-XR) 500 MG 24 hr tablet Take 500 mg by mouth 2  (two) times daily.    . metoprolol succinate (TOPROL-XL) 25 MG 24 hr tablet TAKE 1 TABLET (25 MG TOTAL) BY MOUTH DAILY. 30 tablet 8   CURRENT MEDICATIONS: Scheduled Meds: . aspirin  300 mg Rectal Daily   Or  . aspirin  325 mg Oral Daily  . atorvastatin  80 mg Oral q1800  . clopidogrel  75 mg Oral Daily  . enoxaparin (LOVENOX) injection  40 mg Subcutaneous Q24H  . fenofibrate  160 mg Oral Daily  . gabapentin  300 mg Oral QHS  . gabapentin  600 mg Oral Daily  . insulin aspart  0-9 Units Subcutaneous TID WC  . insulin aspart  35 Units Subcutaneous TID AC  . insulin detemir  75 Units Subcutaneous BID  . lisinopril  20 mg Oral Daily  . metoprolol succinate  25 mg Oral Daily   Continuous Infusions: . sodium chloride 1,000 mL (01/29/16 0345)   PRN Meds:.    Results for orders placed or performed during the hospital encounter of 01/28/16 (from the past 48 hour(s))  Urinalysis, Routine w reflex microscopic     Status: Abnormal   Collection Time: 01/28/16  7:40 PM  Result Value Ref Range   Color, Urine YELLOW YELLOW   APPearance CLEAR CLEAR   Specific Gravity, Urine 1.041 (H) 1.005 - 1.030   pH 5.5 5.0 - 8.0   Glucose, UA >1000 (A) NEGATIVE mg/dL   Hgb urine dipstick NEGATIVE NEGATIVE   Bilirubin Urine NEGATIVE NEGATIVE   Ketones, ur NEGATIVE NEGATIVE mg/dL   Protein, ur NEGATIVE NEGATIVE mg/dL   Nitrite NEGATIVE NEGATIVE   Leukocytes, UA NEGATIVE NEGATIVE  Urine microscopic-add on     Status: None   Collection Time: 01/28/16  7:40 PM  Result Value Ref Range   Squamous Epithelial / LPF NONE SEEN NONE SEEN   WBC, UA 0-5 0 - 5 WBC/hpf   RBC / HPF NONE SEEN 0 - 5 RBC/hpf   Bacteria, UA NONE SEEN NONE SEEN  Basic metabolic panel     Status: Abnormal   Collection Time: 01/28/16  7:51 PM  Result Value Ref Range  Sodium 133 (L) 135 - 145 mmol/L   Potassium 3.9 3.5 - 5.1 mmol/L   Chloride 100 (L) 101 - 111 mmol/L   CO2 23 22 - 32 mmol/L   Glucose, Bld 390 (H) 65 - 99 mg/dL    BUN 16 6 - 20 mg/dL   Creatinine, Ser 0.95 0.61 - 1.24 mg/dL   Calcium 9.8 8.9 - 10.3 mg/dL   GFR calc non Af Amer >60 >60 mL/min   GFR calc Af Amer >60 >60 mL/min    Comment: (NOTE) The eGFR has been calculated using the CKD EPI equation. This calculation has not been validated in all clinical situations. eGFR's persistently <60 mL/min signify possible Chronic Kidney Disease.    Anion gap 10 5 - 15  CBC     Status: None   Collection Time: 01/28/16  7:51 PM  Result Value Ref Range   WBC 10.2 4.0 - 10.5 K/uL   RBC 4.70 4.22 - 5.81 MIL/uL   Hemoglobin 13.4 13.0 - 17.0 g/dL   HCT 40.4 39.0 - 52.0 %   MCV 86.0 78.0 - 100.0 fL   MCH 28.5 26.0 - 34.0 pg   MCHC 33.2 30.0 - 36.0 g/dL   RDW 13.4 11.5 - 15.5 %   Platelets 310 150 - 400 K/uL  Lipid panel     Status: Abnormal   Collection Time: 01/29/16  5:38 AM  Result Value Ref Range   Cholesterol 523 (H) 0 - 200 mg/dL   Triglycerides 1925 (H) <150 mg/dL    Comment: RESULTS CONFIRMED BY MANUAL DILUTION SLIGHT LIPEMIA    HDL NOT REPORTED DUE TO HIGH TRIGLYCERIDES >40 mg/dL   Total CHOL/HDL Ratio NOT REPORTED DUE TO HIGH TRIGLYCERIDES RATIO   VLDL UNABLE TO CALCULATE IF TRIGLYCERIDE OVER 400 mg/dL 0 - 40 mg/dL   LDL Cholesterol UNABLE TO CALCULATE IF TRIGLYCERIDE OVER 400 mg/dL 0 - 99 mg/dL  CBC     Status: Abnormal   Collection Time: 01/29/16  5:40 AM  Result Value Ref Range   WBC 9.4 4.0 - 10.5 K/uL   RBC 4.62 4.22 - 5.81 MIL/uL   Hemoglobin 12.9 (L) 13.0 - 17.0 g/dL   HCT 39.9 39.0 - 52.0 %   MCV 86.4 78.0 - 100.0 fL   MCH 27.9 26.0 - 34.0 pg   MCHC 32.3 30.0 - 36.0 g/dL   RDW 13.6 11.5 - 15.5 %   Platelets 271 150 - 400 K/uL  Comprehensive metabolic panel     Status: Abnormal   Collection Time: 01/29/16  5:40 AM  Result Value Ref Range   Sodium 136 135 - 145 mmol/L   Potassium 4.4 3.5 - 5.1 mmol/L   Chloride 102 101 - 111 mmol/L   CO2 25 22 - 32 mmol/L   Glucose, Bld 357 (H) 65 - 99 mg/dL    Comment: SERUM SL LIPEMIC     BUN 17 6 - 20 mg/dL   Creatinine, Ser 0.95 0.61 - 1.24 mg/dL   Calcium 9.4 8.9 - 10.3 mg/dL   Total Protein 6.5 6.5 - 8.1 g/dL   Albumin 3.4 (L) 3.5 - 5.0 g/dL   AST 17 15 - 41 U/L   ALT 19 17 - 63 U/L   Alkaline Phosphatase 74 38 - 126 U/L   Total Bilirubin 0.7 0.3 - 1.2 mg/dL   GFR calc non Af Amer >60 >60 mL/min   GFR calc Af Amer >60 >60 mL/min    Comment: (NOTE) The eGFR has been  calculated using the CKD EPI equation. This calculation has not been validated in all clinical situations. eGFR's persistently <60 mL/min signify possible Chronic Kidney Disease.    Anion gap 9 5 - 15  Glucose, capillary     Status: Abnormal   Collection Time: 01/29/16  5:51 AM  Result Value Ref Range   Glucose-Capillary 370 (H) 65 - 99 mg/dL   Comment 1 Notify RN    Comment 2 Document in Chart   Glucose, capillary     Status: Abnormal   Collection Time: 01/29/16 10:18 AM  Result Value Ref Range   Glucose-Capillary 319 (H) 65 - 99 mg/dL  Glucose, capillary     Status: Abnormal   Collection Time: 01/29/16 11:53 AM  Result Value Ref Range   Glucose-Capillary 293 (H) 65 - 99 mg/dL   Ct Head Wo Contrast  01/28/2016  CLINICAL DATA:  Slurred speech, lethargic for 3 days EXAM: CT HEAD WITHOUT CONTRAST TECHNIQUE: Contiguous axial images were obtained from the base of the skull through the vertex without intravenous contrast. COMPARISON:  MR brain 10/11/2015 FINDINGS: There is no evidence of mass effect, midline shift or extra-axial fluid collections. There is no evidence of a space-occupying lesion or intracranial hemorrhage. There is no evidence of a cortical-based area of acute infarction. There is a small lacunar infarct along the roof of the left lateral ventricle. The ventricles and sulci are appropriate for the patient's age. The basal cisterns are patent. Visualized portions of the orbits are unremarkable. The visualized portions of the paranasal sinuses and mastoid air cells are unremarkable. The  osseous structures are unremarkable. IMPRESSION: No acute intracranial pathology. Electronically Signed   By: Kathreen Devoid   On: 01/28/2016 20:31   Mr Brain Wo Contrast  01/29/2016  CLINICAL DATA:  Lower extremity weakness and slurred speech beginning yesterday. History of stroke 5 weeks ago, with known RIGHT vertebral artery occlusion. Aphasia and ataxia. History of hypertension, hyperlipidemia, diabetes. EXAM: MRI HEAD WITHOUT CONTRAST TECHNIQUE: Multiplanar, multiecho pulse sequences of the brain and surrounding structures were obtained without intravenous contrast. COMPARISON:  CT HEAD January 28, 2016 and MRI of the head October 11, 2015 FINDINGS: INTRACRANIAL CONTENTS: 3 subcentimeter foci of reduced diffusion LEFT ventral pons with low ADC values. No susceptibility artifact to suggest hemorrhage. The ventricles and sulci are normal for patient's age. Scattered subcentimeter supratentorial white matter FLAIR T2 hyperintensities compatible with chronic small vessel ischemic disease, a few which demonstrate underlying cystic changes. RIGHT ventral pontine old infarcts. Old bilateral caudate head lacunar infarcts. No suspicious parenchymal signal, masses or mass effect. No abnormal extra-axial fluid collections. No extra-axial masses though, contrast enhanced sequences would be more sensitive. Loss of normal RIGHT vertebral artery flow void. Irregular signal central basilar artery was present previously suggesting thrombus or dissection . ORBITS: The included ocular globes and orbital contents are non-suspicious. SINUSES: The mastoid air-cells and included paranasal sinuses are well-aerated. SKULL/SOFT TISSUES: No abnormal sellar expansion. No suspicious calvarial bone marrow signal. Craniocervical junction maintained. IMPRESSION: Acute small LEFT pontine infarcts. Old RIGHT pontine infarcts. 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 Stable chronic changes  including moderate chronic small vessel ischemic disease and old caudate lacunar infarcts. Electronically Signed   By: Elon Alas M.D.   On: 01/29/2016 01:11   [name] Head/brain Wo Cm  01/29/2016  CLINICAL DATA:  60 year old male with acute left brainstem infarct discovered on MRI for lower extremity weakness and slurred speech. Known right vertebral artery  occlusion. Initial encounter. EXAM: MRA HEAD WITHOUT CONTRAST TECHNIQUE: Angiographic images of the Circle of Willis were obtained using MRA technique without intravenous contrast. COMPARISON:  Brain MRI 01/29/2016. Brain MRI and intracranial MRA 10/11/2015 FINDINGS: Continued absence of antegrade flow in the distal right vertebral artery. Preserved antegrade flow in the distal left vertebral artery, but increased left V4 irregularity and mild stenosis (series 3, image 119). This is just proximal to the left PICA origin which remains patent. The left vertebral supplies the basilar. Multifocal basilar artery irregularity again noted. Suspect progressed chronic stenosis at the distal 3rd basilar artery, moderate to severe (series 306, image 9). AICA origins appear to remain patent. SCA and PCA origins are stable and patent. Left PCA branches are stable. Suggestion of increased and moderate to severe stenosis in the right distal PCA superior division today. Antegrade flow in both ICA siphons is stable. Both ophthalmic artery origins remain patent. No siphon stenosis. Carotid termini are stable and patent. Mild irregularity at the left ICA terminus. The left ACA A1 segment is non dominant. MCA and ACA origins otherwise within normal limits. Anterior communicating artery and visualized bilateral ACA branches are stable and within normal limits. MCA M1 segments and bifurcations are stable with mild irregularity. Visualized bilateral MCA branches are stable, no major branch occlusion. IMPRESSION: 1. 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 2. Chronic distal right vertebral artery occlusion. Progressed mild atherosclerotic stenosis in the distal left vertebral artery. 3. Stable anterior circulation with comparatively mild atherosclerosis. Electronically Signed   By: Genevie Ann M.D.   On: 01/29/2016 08:30    ROS: General:no colds or fevers, no weight changes Skin:no rashes or ulcers HEENT:no blurred vision, no congestion, aphasia  CV:see HPI PUL:see HPI GI:no diarrhea constipation or melena, no indigestion GU:no hematuria, no dysuria MS:no joint pain, chronic claudication Neuro:no syncope, no lightheadedness Endo:+ diabetes poorly controlled, no thyroid disease  Blood pressure 130/70, pulse 72, temperature 98.5 F (36.9 C), temperature source Oral, resp. rate 16, height 5' 11"  (1.803 m), weight 240 lb (108.863 kg), SpO2 98 %.  Wt Readings from Last 3 Encounters:  01/29/16 240 lb (108.863 kg)  11/28/15 238 lb (107.956 kg)  10/24/15 242 lb (109.77 kg)    PE: General:Pleasant affect, NAD Skin:Warm and dry, brisk capillary refill HEENT:normocephalic, sclera clear, mucus membranes moist, speech slurred Neck:supple, no JVD, no bruits  Heart:S1S2 RRR without murmur, gallup, rub or click Lungs:clear without rales, rhonchi, or wheezes TGP:QDIYM, soft, non tender, + BS, do not palpate liver spleen or masses Ext:no lower ext edema, 2+ pedal pulses, 2+ radial pulses Neuro:alert and oriented, MAE, follows commands, + facial symmetry  Tele: SR with PACs and non conducted PACs and one episode of 25 min of a fib rate controlled.   Assessment/Plan Principal Problem:   Left pontine stroke ([place])- acute- and now with 25 min of PAF. CHA2DS2VASC score of 5 if   Neuro agrees would begin NOAC    --Carotid dopplers:  09/2015 Findings consistent with 1-39 percent stenosis involving the right internal carotid artery and the left internal carotid  artery.  PAF new, see above, for anticoagulation possible increase BB check TSH  Active Problems:   PVD (peripheral vascular disease) (HCC)   Essential hypertension- mostly controlled on toprol 25, lisinopril 20 increase BB to 50 BID if BP allows    Coronary artery disease, history of CABG and graft dysfunction some chest pressure for  last day or so, would come and go seems worse with deep breath. Check troponin and EKG    Hypertriglyceridemia- significant --on lipitor 80- on fenofibrate- needs control of diabetes. Needs PCP    Uncontrolled diabetes mellitus with hyperglycemia, with long-term current use of insulin ([place]) followed by IM     Stroke (cerebrum) (HCC)      Probable Sleep apnea, periods of apnea in his sleep will need outpt sleep study.     Cecilie Kicks  Nurse Practitioner Certified [place] Pager [phone] or after 5pm or weekends call [phone] 01/29/2016, 12:18 PM  The patient has been seen in conjunction with Cecilie Kicks, NP. All aspects of care have been considered and discussed. The patient has been personally interviewed, examined, and all clinical data has been reviewed.   I agree with interpretation of strips as atrial fibrillation.  Agree with chronic long term anticoagulation. If we use NOAC, I would recommend discontinuation of aspirin and plavix. Before making changes, we should allow IR to perform angio and decide about treatment. If endovascualr therapy recommended then antiplatelet therapy will need continuation but at increased risk of bleeding.

## 2016-01-29 NOTE — Progress Notes (Signed)
ANTICOAGULATION CONSULT NOTE - Initial Consult  Pharmacy Consult for Heparin Indication: atrial fibrillation and stroke  No Known Allergies  Patient Measurements: Height: 5\' 11"  (180.3 cm) Weight: 240 lb (108.863 kg) IBW/kg (Calculated) : 75.3 Heparin Dosing Weight: 98 kg  Vital Signs: Temp: 97.9 F (36.6 C) (07/12 1811) Temp Source: Oral (07/12 1811) BP: 114/70 mmHg (07/12 1836) Pulse Rate: 85 (07/12 1836)  Labs:  Recent Labs  01/28/16 1951 01/29/16 0540 01/29/16 1516  HGB 13.4 12.9*  --   HCT 40.4 39.9  --   PLT 310 271  --   CREATININE 0.95 0.95  --   TROPONINI  --   --  <0.03    Estimated Creatinine Clearance: 102.4 mL/min (by C-G formula based on Cr of 0.95).   Medical History: Past Medical History  Diagnosis Date  . Hyperlipidemia   . CAD (coronary artery disease)     OV, Dr Harlow Asa, [facility] 5/12 on chart  EKG 10/12 EP[facility],  chest x ray 01/07/11 EP[facility]  . Peripheral vascular disease, unspecified ([place]) 03/2015    PCI to the right popliteal  . Hypertension   . Neuropathy, peripheral ([facility])     both feet  . Carpal tunnel syndrome     peripheral neuropathy  . Myocardial infarction ([place]) 02/2001  . DVT (deep venous thrombosis) ([facility])     hx LLE  . Type II diabetes mellitus ([place])   . Arthritis     "hx; cleaned it out of both shoulders"  . Skin cancer     "have had them cut or burned off my face" (03/28/2015)  . Chronic shoulder pain     "both"  . History of kidney stones   . Stroke ([place]) 10/10/2015    Medications:  Prescriptions prior to admission  Medication Sig Dispense Refill Last Dose  . aspirin 325 MG tablet Take 325 mg by mouth daily with breakfast.    01/28/2016 at Unknown time  . atorvastatin (LIPITOR) 80 MG tablet Take 1 tablet (80 mg total) by mouth daily. 30 tablet 0 01/27/2016 at Unknown time  . clopidogrel (PLAVIX) 75 MG tablet TAKE 1 TABLET BY MOUTH EVERY DAY 90 tablet 3 01/28/2016 at Unknown time  . fenofibrate (TRICOR) 145 MG tablet Take 1  tablet (145 mg total) by mouth daily. 90 tablet 3 01/27/2016 at Unknown time  . gabapentin (NEURONTIN) 300 MG capsule Take 300-600 mg by mouth 2 (two) times daily. Take 600mg  in the morning and 300mg  in the evening.   01/28/2016 at am  . insulin aspart (NOVOLOG) 100 UNIT/ML injection Inject 40 Units into the skin 3 (three) times daily before meals. (Patient taking differently: Inject 35 Units into the skin 3 (three) times daily before meals. ) 10 mL 11 01/28/2016 at Unknown time  . LEVEMIR FLEXTOUCH 100 UNIT/ML Pen Inject 80 Units into the skin 2 (two) times daily. Reported on 08/21/2015 (Patient taking differently: Inject 75 Units into the skin 2 (two) times daily. Reported on 08/21/2015) 15 mL 4 01/28/2016 at Unknown time  . lisinopril-hydrochlorothiazide (PRINZIDE,ZESTORETIC) 20-12.5 MG per tablet Take 1 tablet by mouth daily with breakfast.    01/28/2016 at Unknown time  . metFORMIN (GLUCOPHAGE-XR) 500 MG 24 hr tablet Take 500 mg by mouth 2 (two) times daily.   01/28/2016 at am  . metoprolol succinate (TOPROL-XL) 25 MG 24 hr tablet TAKE 1 TABLET (25 MG TOTAL) BY MOUTH DAILY. 30 tablet 8 01/28/2016 at 1200   Scheduled:  . atorvastatin  80 mg Oral q1800  . [  START ON 01/31/2016] enoxaparin (LOVENOX) injection  40 mg Subcutaneous Q24H  . fenofibrate  160 mg Oral Daily  . gabapentin  300 mg Oral QHS  . gabapentin  600 mg Oral Daily  . insulin aspart  0-15 Units Subcutaneous TID WC  . insulin aspart  0-5 Units Subcutaneous QHS  . insulin aspart  35 Units Subcutaneous TID AC  . insulin detemir  75 Units Subcutaneous BID  . lisinopril  20 mg Oral Daily  . metoprolol succinate  25 mg Oral Daily   Infusions:    Assessment: 60yo male presents with stroke and is found to be in Afib. Pharmacy is consulted to dose heparin for stroke. Will start heparin with no bolus and at lower goal.  Goal of Therapy:  Heparin level 0.3-0.5 units/ml Monitor platelets by anticoagulation protocol: Yes   Plan:  Start heparin  infusion at 1200 units/hr Check anti-Xa level in 8 hours and daily while on heparin Continue to monitor H&H and platelets  Andrey Cota. Diona Foley, PharmD, [place] Clinical Pharmacist Pager [phone] 01/29/2016,8:12 PM

## 2016-01-29 NOTE — Progress Notes (Signed)
STROKE TEAM PROGRESS NOTE   HISTORY OF PRESENT ILLNESS (per record) [name] is a 60 y.o. male with history of stroke in March 2017, CAD status post CABG, diabetes mellitus type 2, hypertriglyceridemia, peripheral vascular disease status post femoro/tibial bypass in February 2017 presents to the ER because of persistent difficulty with expressive aphasia and dizziness. Patient's symptoms started on 01/27/2016 when he woke up in the morning. CT of the head did not show anything acute. MRI of the brain shows left pontine infarct. On-call neurologist has been consulted and patient admitted for further workup. Patient on exam is able to move all extremities without difficulty but still has expressive aphasia. No obvious facial asymmetry. Patient is beyond the window period for TPA. He was admitted for further evaluation and treatment.   SUBJECTIVE (INTERVAL HISTORY) His wife and daughter are at the bedside.  Overall he feels his condition is stable. He was told he may go home this morning, that he may need to stay.   OBJECTIVE Temp:  [98.3 F (36.8 C)-98.5 F (36.9 C)] 98.5 F (36.9 C) (07/12 0327) Pulse Rate:  [63-118] 72 (07/12 1100) Cardiac Rhythm:  [-] Heart block (07/12 0700) Resp:  [15-30] 16 (07/12 1100) BP: (110-165)/(16-113) 130/70 mmHg (07/12 1100) SpO2:  [96 %-99 %] 98 % (07/12 1100) Weight:  [107.956 kg (238 lb)-108.863 kg (240 lb)] 108.863 kg (240 lb) (07/12 0327)  CBC:  Recent Labs Lab 01/28/16 1951 01/29/16 0540  WBC 10.2 9.4  HGB 13.4 12.9*  HCT 40.4 39.9  MCV 86.0 86.4  PLT 310 99991111    Basic Metabolic Panel:  Recent Labs Lab 01/28/16 1951 01/29/16 0540  NA 133* 136  K 3.9 4.4  CL 100* 102  CO2 23 25  GLUCOSE 390* 357*  BUN 16 17  CREATININE 0.95 0.95  CALCIUM 9.8 9.4    Lipid Panel:    Component Value Date/Time   CHOL 523* 01/29/2016 0538   TRIG 1925* 01/29/2016 0538   HDL NOT REPORTED DUE TO HIGH TRIGLYCERIDES 01/29/2016 0538   CHOLHDL NOT  REPORTED DUE TO HIGH TRIGLYCERIDES 01/29/2016 0538   VLDL UNABLE TO CALCULATE IF TRIGLYCERIDE OVER 400 mg/dL 01/29/2016 0538   LDLCALC UNABLE TO CALCULATE IF TRIGLYCERIDE OVER 400 mg/dL 01/29/2016 0538   HgbA1c:  Lab Results  Component Value Date   HGBA1C 10.3* 10/11/2015   Urine Drug Screen:    Component Value Date/Time   LABOPIA NONE DETECTED 10/10/2015 1140   COCAINSCRNUR NONE DETECTED 10/10/2015 1140   LABBENZ NONE DETECTED 10/10/2015 1140   AMPHETMU NONE DETECTED 10/10/2015 1140   THCU NONE DETECTED 10/10/2015 1140   LABBARB NONE DETECTED 10/10/2015 1140      IMAGING  Ct Head Wo Contrast  01/28/2016  CLINICAL DATA:  Slurred speech, lethargic for 3 days EXAM: CT HEAD WITHOUT CONTRAST TECHNIQUE: Contiguous axial images were obtained from the base of the skull through the vertex without intravenous contrast. COMPARISON:  MR brain 10/11/2015 FINDINGS: There is no evidence of mass effect, midline shift or extra-axial fluid collections. There is no evidence of a space-occupying lesion or intracranial hemorrhage. There is no evidence of a cortical-based area of acute infarction. There is a small lacunar infarct along the roof of the left lateral ventricle. The ventricles and sulci are appropriate for the patient's age. The basal cisterns are patent. Visualized portions of the orbits are unremarkable. The visualized portions of the paranasal sinuses and mastoid air cells are unremarkable. The osseous structures are unremarkable. IMPRESSION: No acute  intracranial pathology. Electronically Signed   By: Kathreen Devoid   On: 01/28/2016 20:31   Mr Brain Wo Contrast  01/29/2016  CLINICAL DATA:  Lower extremity weakness and slurred speech beginning yesterday. History of stroke 5 weeks ago, with known RIGHT vertebral artery occlusion. Aphasia and ataxia. History of hypertension, hyperlipidemia, diabetes. EXAM: MRI HEAD WITHOUT CONTRAST TECHNIQUE: Multiplanar, multiecho pulse sequences of the brain and  surrounding structures were obtained without intravenous contrast. COMPARISON:  CT HEAD January 28, 2016 and MRI of the head October 11, 2015 FINDINGS: INTRACRANIAL CONTENTS: 3 subcentimeter foci of reduced diffusion LEFT ventral pons with low ADC values. No susceptibility artifact to suggest hemorrhage. The ventricles and sulci are normal for patient's age. Scattered subcentimeter supratentorial white matter FLAIR T2 hyperintensities compatible with chronic small vessel ischemic disease, a few which demonstrate underlying cystic changes. RIGHT ventral pontine old infarcts. Old bilateral caudate head lacunar infarcts. No suspicious parenchymal signal, masses or mass effect. No abnormal extra-axial fluid collections. No extra-axial masses though, contrast enhanced sequences would be more sensitive. Loss of normal RIGHT vertebral artery flow void. Irregular signal central basilar artery was present previously suggesting thrombus or dissection . ORBITS: The included ocular globes and orbital contents are non-suspicious. SINUSES: The mastoid air-cells and included paranasal sinuses are well-aerated. SKULL/SOFT TISSUES: No abnormal sellar expansion. No suspicious calvarial bone marrow signal. Craniocervical junction maintained. IMPRESSION: Acute small LEFT pontine infarcts. Old RIGHT pontine infarcts. 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 Stable chronic changes including moderate chronic small vessel ischemic disease and old caudate lacunar infarcts. Electronically Signed   By: Elon Alas M.D.   On: 01/29/2016 01:11   [name] Head/brain Wo Cm  01/29/2016  CLINICAL DATA:  60 year old male with acute left brainstem infarct discovered on MRI for lower extremity weakness and slurred speech. Known right vertebral artery occlusion. Initial encounter. EXAM: MRA HEAD WITHOUT CONTRAST TECHNIQUE: Angiographic images of the Circle of Willis were obtained using MRA  technique without intravenous contrast. COMPARISON:  Brain MRI 01/29/2016. Brain MRI and intracranial MRA 10/11/2015 FINDINGS: Continued absence of antegrade flow in the distal right vertebral artery. Preserved antegrade flow in the distal left vertebral artery, but increased left V4 irregularity and mild stenosis (series 3, image 119). This is just proximal to the left PICA origin which remains patent. The left vertebral supplies the basilar. Multifocal basilar artery irregularity again noted. Suspect progressed chronic stenosis at the distal 3rd basilar artery, moderate to severe (series 306, image 9). AICA origins appear to remain patent. SCA and PCA origins are stable and patent. Left PCA branches are stable. Suggestion of increased and moderate to severe stenosis in the right distal PCA superior division today. Antegrade flow in both ICA siphons is stable. Both ophthalmic artery origins remain patent. No siphon stenosis. Carotid termini are stable and patent. Mild irregularity at the left ICA terminus. The left ACA A1 segment is non dominant. MCA and ACA origins otherwise within normal limits. Anterior communicating artery and visualized bilateral ACA branches are stable and within normal limits. MCA M1 segments and bifurcations are stable with mild irregularity. Visualized bilateral MCA branches are stable, no major branch occlusion. IMPRESSION: 1. 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 2. Chronic distal right vertebral artery occlusion. Progressed mild atherosclerotic stenosis in the distal left vertebral artery. 3. Stable anterior circulation with comparatively mild atherosclerosis. Electronically Signed   By: Herminio Heads.D.  On: 01/29/2016 08:30       PHYSICAL EXAM  Physical exam: Exam: Gen: NAD, conversant                CV: RRR, no MRG. No Carotid Bruits. No peripheral edema, warm,  nontender Eyes: Conjunctivae clear without exudates or hemorrhage  Neuro: Detailed Neurologic Exam  Speech:    Speech is dysarthric Cognition:    The patient is oriented to person, place, and time;  Cranial Nerves:    The pupils are equal, round, and reactive to light. The fundi are normal and spontaneous venous pulsations are present. Visual fields are full to finger confrontation. Extraocular movements are intact. Trigeminal sensation is intact and the muscles of mastication are normal. Left droop. The palate elevates in the midline. Hearing intact. Voice is normal. Shoulder shrug is normal. The tongue has normal motion without fasciculations.   Coordination:    No dysmetria  Motor Observation:    No asymmetry, no atrophy, and no involuntary movements noted. Tone:    Normal muscle tone.      Strength:    Strength is V/V in the upper and lower limbs.      Sensation: intact to LT     Reflex Exam:  DTR's:    Deep tendon reflexes in the upper and lower extremities are normal bilaterally.   Toes:    The toes are downgoing bilaterally.   Clonus:    Clonus is absent.    ASSESSMENT/PLAN [name] is a 60 y.o. male with history of stroke in March 2017, CAD status post CABG, diabetes mellitus type 2, hypertriglyceridemia, peripheral vascular disease status post femoro/tibial bypass in February 2017 presenting with expressive aphasia and dizziness. He did not receive IV t-PA due to delay in arrival.   Stroke:  L pontine infarct in setting of recent R pontine infarct with progressive BA stenosis  MRI  Acute small left pontine infarct. Old right pontine infarct. 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 Chronic right VA occlusion.progress atherosclerotic disease left VA. Stable anterior circulation  Carotid  Doppler  No carotid disease per Doppler done March 2017  2D Echo  No source of embolus, EF 55-60% per echo done March 2017. 2-D echo this admission pending.  Cerebral angiogram recommended to look at progressive basilar artery stenosis since March. Order placed. Dr. Estanislado Pandy to see.  LDL unable to calculate  HgbA1c 10.3 and March  Lovenox 40 mg sq daily for VTE prophylaxis  Diet Heart Room service appropriate?: Yes; Fluid consistency:: Thin  aspirin 325 mg daily and clopidogrel 75 mg daily prior to admission, now on aspirin 325 mg daily and clopidogrel 75 mg daily  Patient counseled to be compliant with his antithrombotic medications  Ongoing aggressive stroke risk factor management  Therapy recommendations:  pending   Disposition:  pending   Hypertension  Stable  Permissive hypertension (OK if < 220/120) but gradually normalize in 5-7 days  Long-term BP goal normotensive  Hyperlipidemia Hypertriglyceridemia  Home meds:  Lipitor 80, and TriCor 145, resumed in hospital  LDL unable to calculate, goal < 70  Continue statin at discharge  Diabetes type II  HgbA1c 10.3 in March, goal < 7.0  Uncontrolled  Other Stroke Risk Factors  Obesity, Body mass index is 33.49 kg/(m^2)., recommend weight loss, diet and exercise as appropriate   Hx stroke/TIA  R pontine  infarct i setting of R VA occlusion and moderate to severe VA stenosis  Coronary artery disease s/p CABG 2002  PVD s/p feb-tib bypass 08/2015  Hospital day # 0  BIBY,SHARON  [place] for Pager information 01/29/2016 1:24 PM    Personally examined patient and images, and have participated in and made any corrections needed to history, physical, neuro exam,assessment and plan as stated above.  I have personally obtained the history, evaluated lab date, reviewed imaging studies and agree with radiology interpretations. Patient presented with dysarthria, previous history of pontine  stroke and basilar stenosis. Repeat imaging shows MRI  Acute small left pontine infarct, Old right pontine infarct, and probable mid BA thrombosis/dissection. Given the findings in the basilar artery we have referred patient for cerebral angiogram and possible intervention. At this point we'll keep him on aspirin and Plavix. Had a discussion with family at bedside and they agree with the plan.    Sarina Ill, MD Stroke Neurology [phone] [facility]      To contact Stroke Continuity provider, please refer to [email or web address] After hours, contact General Neurology

## 2016-01-29 NOTE — Progress Notes (Signed)
   01/29/16 0900  SLP G-Codes **NOT FOR INPATIENT CLASS**  Functional Assessment Tool Used (clinical judgement)  Functional Limitations Swallowing  Swallow Current Status [id number]) CK  Swallow Goal Status [id number]) CI  SLP Evaluations  $ SLP Speech Visit 1 Procedure  SLP Evaluations  $BSS Swallow 1 Procedure  $Swallowing Treatment 1 Procedure
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
## 2016-01-29 NOTE — Consult Note (Signed)
Admission H&P    Chief Complaint: New onset dizziness and slurred speech.  HPI: [name] is an 60 y.o. male with a history of hypertension, hyperlipidemia, coronary artery disease, diabetes mellitus, deep vein thrombosis and stroke in March 2017, presenting with new-onset dizziness and slurred speech. Patient was last known well on 01/26/2016. Deficits were present when he woke up following morning and have gotten somewhat worse. He's been taking aspirin and Plavix daily. CT scan of his head showed no acute intracranial abnormality. MRI showed multiple small left pontine acute ischemic infarctions. NIH stroke score was 2.  LSN: 01/26/2016 tPA Given: No: Beyond time under for treatment consideration mRankin:  Past Medical History  Diagnosis Date  . Hyperlipidemia   . CAD (coronary artery disease)     OV, Dr Harlow Asa, [facility] 5/12 on chart  EKG 10/12 EPIC,  chest x ray 01/07/11 EPIC  . Peripheral vascular disease, unspecified ([place]) 03/2015    PCI to the right popliteal  . Hypertension   . Neuropathy, peripheral (HCC)     both feet  . Carpal tunnel syndrome     peripheral neuropathy  . Myocardial infarction ([place]) 02/2001  . DVT (deep venous thrombosis) (HCC)     hx LLE  . Type II diabetes mellitus ([place])   . Arthritis     "hx; cleaned it out of both shoulders"  . Skin cancer     "have had them cut or burned off my face" (03/28/2015)  . Chronic shoulder pain     "both"  . History of kidney stones   . Stroke [facility]) 10/10/2015    Past Surgical History  Procedure Laterality Date  . Knee arthroscopy Left X 2  . Carpal tunnel release Right 2000's  . Shoulder arthroscopy Left   . Femoral-tibial bypass graft Left 01/07/11    fem-posterior tibial BPG using reversed left GSV               12/15/11 OK BY DR Ninfa Linden TO CONTINUE ASA AND PLAVIX  . Orif radius & ulna fractures Left   . Popliteal artery stent Left 2010-2012 X 4  . Carpal tunnel release  12/18/2011    Procedure: CARPAL TUNNEL  RELEASE;  Surgeon: Mcarthur Rossetti, MD;  Location: [facility];  Service: Orthopedics;  Laterality: Left;  Left Open Carpal Tunnel Release  . Shoulder arthroscopy Right 12/18/2011  . Shoulder arthroscopy  07/08/2012    Procedure: ARTHROSCOPY SHOULDER;  Surgeon: Mcarthur Rossetti, MD;  Location: [facility];  Service: Orthopedics;  Laterality: Left;  Left Shoulder Arthroscopy with Manipulation and Extensive Debridement  . Cystoscopy  several done in past  . Lithotripsy  several done in past  . Shoulder arthroscopy with rotator cuff repair Left 07/14/2013    Procedure: LEFT SHOULDER ARTHROSCOPY WITH EXTENSIVE DEBRIDEMENT, DISTAL CLAVICLE REPAIR;  Surgeon: Mcarthur Rossetti, MD;  Location: [facility];  Service: Orthopedics;  Laterality: Left;  . Peripheral vascular catheterization N/A 03/28/2015    Procedure: Lower Extremity Angiography;  Surgeon: Lorretta Harp, MD;  Location: [place] [facility] LAB;  Service: Cardiovascular;  Laterality: N/A;  . Peripheral vascular catheterization Right 03/28/2015    Procedure: Peripheral Vascular Atherectomy;  Surgeon: Lorretta Harp, MD;  Location: [place] [facility] LAB;  Service: Cardiovascular;  Laterality: Right;  popliteal;   . Appendectomy  1977  . Laparoscopic cholecystectomy  1990's  . Femoropopliteal thrombectomy / embolectomy  ~ 2010  . Skin cancer excision      "left side of my  forehead"  . Coronary artery bypass graft  2002    CABG X 4  . Cardiac catheterization  2002       . Fracture surgery    . Coronary angioplasty    . Peripheral vascular catheterization N/A 08/23/2015    Procedure: Abdominal Aortogram;  Surgeon: Elam Dutch, MD;  Location: [place] [facility];  Service: Cardiovascular;  Laterality: N/A;  . Femoral-tibial bypass graft Left 09/06/2015    Procedure: LEFT FEMORAL-POSTERIOR TIBIAL ARTERY BYPASS GRAFT WITH COMPOSITE PTFE AND RIGHT ARM VEIN;  Surgeon: Elam Dutch, MD;  Location: [place];  Service: Vascular;  Laterality: Left;  .  Vein harvest Right 09/06/2015    Procedure: RIGHT ARM VEIN HARVEST;  Surgeon: Elam Dutch, MD;  Location: [facility] OR;  Service: Vascular;  Laterality: Right;    Family History  Problem Relation Age of Onset  . Cancer Mother     Breast and Brain tumor  . Cancer Father     Blood vessel tumor   Social History:  reports that he has never smoked. He has never used smokeless tobacco. He reports that he does not drink alcohol or use illicit drugs.  Allergies: No Known Allergies  Medications: Preadmission medications were reviewed by me.  ROS: History obtained from spouse and the patient  General ROS: negative for - chills, fatigue, fever, night sweats, weight gain or weight loss Psychological ROS: negative for - behavioral disorder, hallucinations, memory difficulties, mood swings or suicidal ideation Ophthalmic ROS: negative for - blurry vision, double vision, eye pain or loss of vision ENT ROS: negative for - epistaxis, nasal discharge, oral lesions, sore throat, tinnitus or vertigo Allergy and Immunology ROS: negative for - hives or itchy/watery eyes Hematological and Lymphatic ROS: negative for - bleeding problems, bruising or swollen lymph nodes Endocrine ROS: negative for - galactorrhea, hair pattern changes, polydipsia/polyuria or temperature intolerance Respiratory ROS: negative for - cough, hemoptysis, shortness of breath or wheezing Cardiovascular ROS: negative for - chest pain, dyspnea on exertion, edema or irregular heartbeat Gastrointestinal ROS: Swallowing difficulty as well as frequent hiccups over the past several weeks Genito-Urinary ROS: negative for - dysuria, hematuria, incontinence or urinary frequency/urgency Musculoskeletal ROS: negative for - joint swelling or muscular weakness Neurological ROS: as noted in HPI Dermatological ROS: negative for rash and skin lesion changes  Physical Examination: Blood pressure 134/75, pulse 63, temperature 98.4 F (36.9 C),  temperature source Oral, resp. rate 19, height 5' 11"  (1.803 m), weight 107.956 kg (238 lb), SpO2 96 %.  HEENT-  Normocephalic, no lesions, without obvious abnormality.  Normal external eye and conjunctiva.  Normal TM's bilaterally.  Normal auditory canals and external ears. Normal external nose, mucus membranes and septum.  Normal pharynx. Neck supple with no masses, nodes, nodules or enlargement. Cardiovascular - irregularly irregular rhythm, S1, S2 normal and no S3 or S4 Lungs - chest clear, no wheezing, rales, normal symmetric air entry Abdomen - soft, non-tender; bowel sounds normal; no masses,  no organomegaly Extremities - no joint deformities, effusion, or inflammation  Neurologic Examination: Mental Status: Alert, oriented, thought content appropriate.  Speech moderately slurred without evidence of aphasia. Able to follow commands without difficulty. Cranial Nerves: II-Visual fields were normal. III/IV/VI-Pupils were equal and reacted normally to light. Extraocular movements were full and conjugate.    V/VII-no facial numbness; mild left lower facial weakness. VIII-normal. X-moderate dysarthria symmetrical palatal movement. XI: trapezius strength/neck flexion strength normal bilaterally XII-midline tongue extension with normal strength. Motor: 5/5 bilaterally with  normal tone and bulk Sensory: Normal throughout. Deep Tendon Reflexes: 1+ and symmetric. Plantars: Mute bilaterally Cerebellar: Normal finger-to-nose testing. Carotid auscultation: Normal  Results for orders placed or performed during the hospital encounter of 01/28/16 (from the past 48 hour(s))  Urinalysis, Routine w reflex microscopic     Status: Abnormal   Collection Time: 01/28/16  7:40 PM  Result Value Ref Range   Color, Urine YELLOW YELLOW   APPearance CLEAR CLEAR   Specific Gravity, Urine 1.041 (H) 1.005 - 1.030   pH 5.5 5.0 - 8.0   Glucose, UA >1000 (A) NEGATIVE mg/dL   Hgb urine dipstick NEGATIVE  NEGATIVE   Bilirubin Urine NEGATIVE NEGATIVE   Ketones, ur NEGATIVE NEGATIVE mg/dL   Protein, ur NEGATIVE NEGATIVE mg/dL   Nitrite NEGATIVE NEGATIVE   Leukocytes, UA NEGATIVE NEGATIVE  Urine microscopic-add on     Status: None   Collection Time: 01/28/16  7:40 PM  Result Value Ref Range   Squamous Epithelial / LPF NONE SEEN NONE SEEN   WBC, UA 0-5 0 - 5 WBC/hpf   RBC / HPF NONE SEEN 0 - 5 RBC/hpf   Bacteria, UA NONE SEEN NONE SEEN  Basic metabolic panel     Status: Abnormal   Collection Time: 01/28/16  7:51 PM  Result Value Ref Range   Sodium 133 (L) 135 - 145 mmol/L   Potassium 3.9 3.5 - 5.1 mmol/L   Chloride 100 (L) 101 - 111 mmol/L   CO2 23 22 - 32 mmol/L   Glucose, Bld 390 (H) 65 - 99 mg/dL   BUN 16 6 - 20 mg/dL   Creatinine, Ser 0.95 0.61 - 1.24 mg/dL   Calcium 9.8 8.9 - 10.3 mg/dL   GFR calc non Af Amer >60 >60 mL/min   GFR calc Af Amer >60 >60 mL/min    Comment: (NOTE) The eGFR has been calculated using the CKD EPI equation. This calculation has not been validated in all clinical situations. eGFR's persistently <60 mL/min signify possible Chronic Kidney Disease.    Anion gap 10 5 - 15  CBC     Status: None   Collection Time: 01/28/16  7:51 PM  Result Value Ref Range   WBC 10.2 4.0 - 10.5 K/uL   RBC 4.70 4.22 - 5.81 MIL/uL   Hemoglobin 13.4 13.0 - 17.0 g/dL   HCT 40.4 39.0 - 52.0 %   MCV 86.0 78.0 - 100.0 fL   MCH 28.5 26.0 - 34.0 pg   MCHC 33.2 30.0 - 36.0 g/dL   RDW 13.4 11.5 - 15.5 %   Platelets 310 150 - 400 K/uL   Ct Head Wo Contrast  01/28/2016  CLINICAL DATA:  Slurred speech, lethargic for 3 days EXAM: CT HEAD WITHOUT CONTRAST TECHNIQUE: Contiguous axial images were obtained from the base of the skull through the vertex without intravenous contrast. COMPARISON:  MR brain 10/11/2015 FINDINGS: There is no evidence of mass effect, midline shift or extra-axial fluid collections. There is no evidence of a space-occupying lesion or intracranial hemorrhage. There  is no evidence of a cortical-based area of acute infarction. There is a small lacunar infarct along the roof of the left lateral ventricle. The ventricles and sulci are appropriate for the patient's age. The basal cisterns are patent. Visualized portions of the orbits are unremarkable. The visualized portions of the paranasal sinuses and mastoid air cells are unremarkable. The osseous structures are unremarkable. IMPRESSION: No acute intracranial pathology. Electronically Signed   By: Kathreen Devoid  On: 01/28/2016 20:31   Mr Brain Wo Contrast  01/29/2016  CLINICAL DATA:  Lower extremity weakness and slurred speech beginning yesterday. History of stroke 5 weeks ago, with known RIGHT vertebral artery occlusion. Aphasia and ataxia. History of hypertension, hyperlipidemia, diabetes. EXAM: MRI HEAD WITHOUT CONTRAST TECHNIQUE: Multiplanar, multiecho pulse sequences of the brain and surrounding structures were obtained without intravenous contrast. COMPARISON:  CT HEAD January 28, 2016 and MRI of the head October 11, 2015 FINDINGS: INTRACRANIAL CONTENTS: 3 subcentimeter foci of reduced diffusion LEFT ventral pons with low ADC values. No susceptibility artifact to suggest hemorrhage. The ventricles and sulci are normal for patient's age. Scattered subcentimeter supratentorial white matter FLAIR T2 hyperintensities compatible with chronic small vessel ischemic disease, a few which demonstrate underlying cystic changes. RIGHT ventral pontine old infarcts. Old bilateral caudate head lacunar infarcts. No suspicious parenchymal signal, masses or mass effect. No abnormal extra-axial fluid collections. No extra-axial masses though, contrast enhanced sequences would be more sensitive. Loss of normal RIGHT vertebral artery flow void. Irregular signal central basilar artery was present previously suggesting thrombus or dissection . ORBITS: The included ocular globes and orbital contents are non-suspicious. SINUSES: The mastoid  air-cells and included paranasal sinuses are well-aerated. SKULL/SOFT TISSUES: No abnormal sellar expansion. No suspicious calvarial bone marrow signal. Craniocervical junction maintained. IMPRESSION: Acute small LEFT pontine infarcts. Old RIGHT pontine infarcts. 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 Stable chronic changes including moderate chronic small vessel ischemic disease and old caudate lacunar infarcts. Electronically Signed   By: Elon Alas M.D.   On: 01/29/2016 01:11    Assessment: 60 y.o. male with multiple risk factors for stroke as well as history of stroke in March 2017, presenting with multiple small acute left pontine ischemic infarctions. Patient is also presenting with atrial fibrillation, which apparently has not been previously documented.  Stroke Risk Factors - atrial fibrillation, diabetes mellitus, hyperlipidemia and hypertension  Plan: 1. HgbA1c, fasting lipid panel 2. MRA  of the brain without contrast 3. PT consult, OT consult, Speech consult 4. Prophylactic therapy-Antiplatelet med: Aspirin and Plavix, may need anticoagulation instead for atrial fibrillation 5. Risk factor modification 6. Telemetry monitoring  C.R. Nicole Kindred, MD [facility] [phone]  01/29/2016, 2:16 AM

## 2016-01-29 NOTE — Progress Notes (Signed)
Admitted to 5C17 via ED W/C.  Gait steady, slurred speech, states difficulty swallowing water at home.  ST eval ordered in ED, although they passed him on Swallow Screen.  Denies pain, placed on tele & CCMD notified / verified.  Dr Hal Hope notified of admit location.

## 2016-01-30 ENCOUNTER — Ambulatory Visit (HOSPITAL_COMMUNITY): Payer: BLUE CROSS/BLUE SHIELD

## 2016-01-30 ENCOUNTER — Inpatient Hospital Stay (HOSPITAL_COMMUNITY): Payer: BLUE CROSS/BLUE SHIELD

## 2016-01-30 DIAGNOSIS — E1165 Type 2 diabetes mellitus with hyperglycemia: Secondary | ICD-10-CM

## 2016-01-30 DIAGNOSIS — I639 Cerebral infarction, unspecified: Secondary | ICD-10-CM | POA: Insufficient documentation

## 2016-01-30 DIAGNOSIS — I63532 Cerebral infarction due to unspecified occlusion or stenosis of left posterior cerebral artery: Secondary | ICD-10-CM

## 2016-01-30 DIAGNOSIS — I2510 Atherosclerotic heart disease of native coronary artery without angina pectoris: Secondary | ICD-10-CM

## 2016-01-30 DIAGNOSIS — I480 Paroxysmal atrial fibrillation: Secondary | ICD-10-CM

## 2016-01-30 DIAGNOSIS — Z794 Long term (current) use of insulin: Secondary | ICD-10-CM

## 2016-01-30 DIAGNOSIS — I10 Essential (primary) hypertension: Secondary | ICD-10-CM

## 2016-01-30 DIAGNOSIS — I6350 Cerebral infarction due to unspecified occlusion or stenosis of unspecified cerebral artery: Secondary | ICD-10-CM | POA: Insufficient documentation

## 2016-01-30 LAB — GLUCOSE, CAPILLARY
GLUCOSE-CAPILLARY: 270 mg/dL — AB (ref 65–99)
GLUCOSE-CAPILLARY: 213 mg/dL — AB (ref 65–99)
GLUCOSE-CAPILLARY: 270 mg/dL — AB (ref 65–99)
Glucose-Capillary: 110 mg/dL — ABNORMAL HIGH (ref 65–99)
Glucose-Capillary: 156 mg/dL — ABNORMAL HIGH (ref 65–99)
Glucose-Capillary: 241 mg/dL — ABNORMAL HIGH (ref 65–99)
Glucose-Capillary: 301 mg/dL — ABNORMAL HIGH (ref 65–99)

## 2016-01-30 LAB — BASIC METABOLIC PANEL
ANION GAP: 7 (ref 5–15)
BUN: 19 mg/dL (ref 6–20)
CALCIUM: 9.3 mg/dL (ref 8.9–10.3)
CO2: 26 mmol/L (ref 22–32)
CREATININE: 0.88 mg/dL (ref 0.61–1.24)
Chloride: 102 mmol/L (ref 101–111)
Glucose, Bld: 251 mg/dL — ABNORMAL HIGH (ref 65–99)
Potassium: 4.1 mmol/L (ref 3.5–5.1)
Sodium: 135 mmol/L (ref 135–145)

## 2016-01-30 LAB — CBC
HEMATOCRIT: 38.8 % — AB (ref 39.0–52.0)
HEMOGLOBIN: 12.6 g/dL — AB (ref 13.0–17.0)
MCH: 28.1 pg (ref 26.0–34.0)
MCHC: 32.5 g/dL (ref 30.0–36.0)
MCV: 86.6 fL (ref 78.0–100.0)
Platelets: 289 10*3/uL (ref 150–400)
RBC: 4.48 MIL/uL (ref 4.22–5.81)
RDW: 13.7 % (ref 11.5–15.5)
WBC: 10.4 10*3/uL (ref 4.0–10.5)

## 2016-01-30 LAB — PROTIME-INR
INR: 1.05 (ref 0.00–1.49)
PROTHROMBIN TIME: 13.9 s (ref 11.6–15.2)

## 2016-01-30 LAB — APTT: APTT: 34 s (ref 24–37)

## 2016-01-30 LAB — HEPARIN LEVEL (UNFRACTIONATED)
Heparin Unfractionated: <0.1 IU/mL — ABNORMAL LOW (ref 0.30–0.70)
Heparin Unfractionated: <0.1 IU/mL — ABNORMAL LOW (ref 0.30–0.70)

## 2016-01-30 MED ORDER — MIDAZOLAM HCL 2 MG/2ML IJ SOLN
INTRAMUSCULAR | Status: AC
  Filled 2016-01-30: qty 2

## 2016-01-30 MED ORDER — HEPARIN SODIUM (PORCINE) 1000 UNIT/ML IJ SOLN
INTRAMUSCULAR | Status: AC
  Filled 2016-01-30: qty 2

## 2016-01-30 MED ORDER — HEPARIN SODIUM (PORCINE) 1000 UNIT/ML IJ SOLN
INTRAMUSCULAR | Status: AC | PRN
  Administered 2016-01-30: 1000 [IU] via INTRAVENOUS

## 2016-01-30 MED ORDER — IOPAMIDOL (ISOVUE-300) INJECTION 61%
INTRAVENOUS | Status: AC
  Filled 2016-01-30: qty 50

## 2016-01-30 MED ORDER — SODIUM CHLORIDE 0.9 % IV SOLN
INTRAVENOUS | Status: AC
  Administered 2016-01-30: 17:00:00 via INTRAVENOUS

## 2016-01-30 MED ORDER — INSULIN DETEMIR 100 UNIT/ML ~~LOC~~ SOLN
50.0000 [IU] | Freq: Two times a day (BID) | SUBCUTANEOUS | Status: DC
  Administered 2016-01-30: 50 [IU] via SUBCUTANEOUS
  Filled 2016-01-30 (×2): qty 0.5

## 2016-01-30 MED ORDER — MIDAZOLAM HCL 2 MG/2ML IJ SOLN
INTRAMUSCULAR | Status: AC | PRN
  Administered 2016-01-30: 1 mg via INTRAVENOUS

## 2016-01-30 MED ORDER — LIDOCAINE HCL 1 % IJ SOLN
INTRAMUSCULAR | Status: AC
  Filled 2016-01-30: qty 20

## 2016-01-30 MED ORDER — FENTANYL CITRATE (PF) 100 MCG/2ML IJ SOLN
INTRAMUSCULAR | Status: AC | PRN
  Administered 2016-01-30: 25 ug via INTRAVENOUS

## 2016-01-30 MED ORDER — LIDOCAINE HCL (PF) 1 % IJ SOLN
INTRAMUSCULAR | Status: DC | PRN
  Administered 2016-01-30: 10 mL

## 2016-01-30 MED ORDER — HEPARIN (PORCINE) IN NACL 100-0.45 UNIT/ML-% IJ SOLN
1750.0000 [IU]/h | INTRAMUSCULAR | Status: DC
  Administered 2016-01-31: 1450 [IU]/h via INTRAVENOUS
  Filled 2016-01-30 (×2): qty 250

## 2016-01-30 MED ORDER — IOPAMIDOL (ISOVUE-300) INJECTION 61%
INTRAVENOUS | Status: AC
  Administered 2016-01-30: 70 mL
  Filled 2016-01-30: qty 150

## 2016-01-30 MED ORDER — FENTANYL CITRATE (PF) 100 MCG/2ML IJ SOLN
INTRAMUSCULAR | Status: AC
  Filled 2016-01-30: qty 2

## 2016-01-30 NOTE — Sedation Documentation (Signed)
Patient is resting comfortably. 

## 2016-01-30 NOTE — Progress Notes (Addendum)
ANTICOAGULATION CONSULT NOTE - Follow-up Consult  Pharmacy Consult for Heparin Indication: atrial fibrillation and stroke  No Known Allergies  Patient Measurements: Height: 5\' 11"  (180.3 cm) Weight: 240 lb (108.863 kg) IBW/kg (Calculated) : 75.3 Heparin Dosing Weight: 98 kg  Vital Signs: Temp: 98.1 F (36.7 C) (07/13 1330) Temp Source: Oral (07/13 1330) BP: 132/42 mmHg (07/13 1330) Pulse Rate: 72 (07/13 1330)  Labs:  Recent Labs  01/28/16 1951 01/29/16 0540 01/29/16 1516 01/30/16 0425 01/30/16 0434 01/30/16 1215  HGB 13.4 12.9*  --  12.6*  --   --   HCT 40.4 39.9  --  38.8*  --   --   PLT 310 271  --  289  --   --   APTT  --   --   --  34  --   --   LABPROT  --   --   --  13.9  --   --   INR  --   --   --  1.05  --   --   HEPARINUNFRC  --   --   --   --  <0.10* <0.10*  CREATININE 0.95 0.95  --  0.88  --   --   TROPONINI  --   --  <0.03  --   --   --     Estimated Creatinine Clearance: 110.6 mL/min (by C-G formula based on Cr of 0.88).  Assessment: 60yo male presented 01/28/16 PM. Admitted  with stroke and was found to be in Afib. Pharmacy was consulted on 7/12 to dose heparin for stroke - recommended by neurology. Heparin rate was increased this AM to 1450 units/hr.  The 6 hour HL <0.1 again,  BUT.... Heparin drip was stopped @~09:30 for TEE, pt got bumped on the cerebral angio schedule today (ie. Not done yet) per RN's report. RN states that patient returned to [facility] and she restarted IV heparin drip at ~ 11:00 AM, thus HL is LOW & doesn't tell us anything. RN not sure when he will go for cerebral angio but hopefully today. No bleeding per RN's report  Goal of Therapy:  Heparin level 0.3-0.5 units/ml Monitor platelets by anticoagulation protocol: Yes   Plan:  Continue heparin at current rate 1450 units/hr I will f/u later today to either check 6h HL if he does not go for cerebral angio today or f/u after cerebral angio for restart of IV heparin and order heparin  level.     Nicole Cella, RPh Clinical Pharmacist Pager: [phone] 01/30/2016,1:40 PM

## 2016-01-30 NOTE — Progress Notes (Signed)
Pt's heparin stopped to go to IR, also blood sugar is 110

## 2016-01-30 NOTE — Sedation Documentation (Signed)
Patient denies pain and is resting comfortably.  

## 2016-01-30 NOTE — Progress Notes (Signed)
Subjective: Not in room in IR  Another episode of a fib this AM rate controlled.   Objective: Vital signs in last 24 hours: Temp:  [97.7 F (36.5 C)-98.2 F (36.8 C)] 97.9 F (36.6 C) (07/13 0930) Pulse Rate:  [56-93] 84 (07/13 0930) Resp:  [16-20] 18 (07/13 0930) BP: (105-142)/(65-77) 120/71 mmHg (07/13 0930) SpO2:  [97 %-99 %] 99 % (07/13 0930) Weight change:  Last BM Date: 01/28/16 Intake/Output from previous day: not managed 07/12 0701 - 07/13 0700 In: 240 [P.O.:240] Out: -  Intake/Output this shift:    [id number] in IR  Tele:  SR with PACs and non conducted PACs A fib rate controlled around 0900 AM  Lab Results:  Recent Labs  01/29/16 0540 01/30/16 0425  WBC 9.4 10.4  HGB 12.9* 12.6*  HCT 39.9 38.8*  PLT 271 289   BMET  Recent Labs  01/29/16 0540 01/30/16 0425  NA 136 135  K 4.4 4.1  CL 102 102  CO2 25 26  GLUCOSE 357* 251*  BUN 17 19  CREATININE 0.95 0.88  CALCIUM 9.4 9.3    Recent Labs  01/29/16 1516  TROPONINI <0.03    Lab Results  Component Value Date   CHOL 523* 01/29/2016   HDL NOT REPORTED DUE TO HIGH TRIGLYCERIDES 01/29/2016   LDLCALC UNABLE TO CALCULATE IF TRIGLYCERIDE OVER 400 mg/dL 01/29/2016   LDLDIRECT 87 01/16/2015   TRIG 1925* 01/29/2016   CHOLHDL NOT REPORTED DUE TO HIGH TRIGLYCERIDES 01/29/2016   Lab Results  Component Value Date   HGBA1C 11.5* 01/29/2016     Lab Results  Component Value Date   TSH 2.497 01/29/2016    Hepatic Function Panel  Recent Labs  01/29/16 0540  PROT 6.5  ALBUMIN 3.4*  AST 17  ALT 19  ALKPHOS 74  BILITOT 0.7    Recent Labs  01/29/16 0538  CHOL 523*   No results for input(s): PROTIME in the last 72 hours.     Studies/Results: Ct Angio Head W Or Wo Contrast  01/29/2016  CLINICAL DATA:  Followup code stroke.  New neurological symptoms. EXAM: CT ANGIOGRAPHY HEAD AND NECK TECHNIQUE: Multidetector CT imaging of the head and neck was performed using the  standard protocol during bolus administration of intravenous contrast. Multiplanar CT image reconstructions and MIPs were obtained to evaluate the vascular anatomy. Carotid stenosis measurements (when applicable) are obtained utilizing NASCET criteria, using the distal internal carotid diameter as the denominator. CONTRAST:  50 cc Isovue 370 COMPARISON:  MR exams same day.  CT 01/28/2016.  MRI 10/11/2015. FINDINGS: CT HEAD No sign of acute infarction by CT. Acute infarction demonstrated in the pons by MRI is not resolved. No cerebellar insult. Old lacunar infarctions in the basal ganglia appear the same. Mild chronic small-vessel ischemic changes of the white matter appear the same. No hemorrhage, hydrocephalus or extra-axial collection. No calvarial abnormality. Sinuses are clear. CTA NECK Aortic arch: Aortic atherosclerosis. No aneurysm or dissection. Previous CABG. Right carotid system: Common carotid artery widely patent to the bifurcation. Atherosclerotic plaque affecting the carotid bifurcation. Minimal diameter in the ICA bulb measures 3.5 mm. Compared to a more distal cervical ICA diameter of 5 mm, this indicates a 30% stenosis. Left carotid system: Common carotid artery widely patent to the bifurcation. Mild atherosclerotic plaque at carotid bifurcation but no stenosis. 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 Left vertebral artery origin widely patent. Left vertebral is a large vessel widely  patent through the cervical region. Skeleton: Ordinary mild cervical spondylosis. Other neck: No mass or lymphadenopathy.  Lung apices are clear. CTA HEAD Anterior circulation: Internal carotid arteries are widely patent through the skullbase and siphon regions. Atherosclerotic calcification in the carotid siphon regions but no stenosis greater than 30%. Supraclinoid internal carotid artery is widely patent. The anterior and middle cerebral vessels are patent without proximal stenosis,  aneurysm or vascular malformation. No missing branch vessels detected. Posterior circulation: No antegrade flow in the distal right vertebral artery. Left vertebral artery is widely patent to the foramen magnum. There is atherosclerotic disease of the left vertebral artery at the foramen magnum with stenosis of 50%. Beyond that, the vertebral is widely patent to the basilar. Retrograde flow in the right vertebral artery to PICA. Focal stenosis of the basilar artery as demonstrated at MR angiography estimated at 70%. Venous sinuses: Patent and normal Anatomic variants: None significant Delayed phase: No abnormal enhancement IMPRESSION: Atherosclerotic plaque affecting the right carotid bifurcation. 30% stenosis of the proximal ICA. No left carotid bifurcation stenosis. 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 Left vertebral artery widely patent in the neck. 50% stenosis of the left vertebral artery at the foramen magnum. 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 30% stenosis of the right carotid siphon. Anterior and middle cerebral vessels widely patent. CT the brain does not show any new finding. Electronically Signed   By: Nelson Chimes M.D.   On: 01/29/2016 19:41   Ct Head Wo Contrast  01/29/2016  CLINICAL DATA:  Followup code stroke.  New neurological symptoms. EXAM: CT ANGIOGRAPHY HEAD AND NECK TECHNIQUE: Multidetector CT imaging of the head and neck was performed using the standard protocol during bolus administration of intravenous contrast. Multiplanar CT image reconstructions and MIPs were obtained to evaluate the vascular anatomy. Carotid stenosis measurements (when applicable) are obtained utilizing NASCET criteria, using the distal internal carotid diameter as the denominator. CONTRAST:  50 cc Isovue 370 COMPARISON:  MR exams same day.  CT 01/28/2016.  MRI 10/11/2015. FINDINGS: CT HEAD No sign of acute infarction by CT. Acute infarction demonstrated in the pons by MRI is not resolved. No  cerebellar insult. Old lacunar infarctions in the basal ganglia appear the same. Mild chronic small-vessel ischemic changes of the white matter appear the same. No hemorrhage, hydrocephalus or extra-axial collection. No calvarial abnormality. Sinuses are clear. CTA NECK Aortic arch: Aortic atherosclerosis. No aneurysm or dissection. Previous CABG. Right carotid system: Common carotid artery widely patent to the bifurcation. Atherosclerotic plaque affecting the carotid bifurcation. Minimal diameter in the ICA bulb measures 3.5 mm. Compared to a more distal cervical ICA diameter of 5 mm, this indicates a 30% stenosis. Left carotid system: Common carotid artery widely patent to the bifurcation. Mild atherosclerotic plaque at carotid bifurcation but no stenosis. 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 Left vertebral artery origin widely patent. Left vertebral is a large vessel widely patent through the cervical region. Skeleton: Ordinary mild cervical spondylosis. Other neck: No mass or lymphadenopathy.  Lung apices are clear. CTA HEAD Anterior circulation: Internal carotid arteries are widely patent through the skullbase and siphon regions. Atherosclerotic calcification in the carotid siphon regions but no stenosis greater than 30%. Supraclinoid internal carotid artery is widely patent. The anterior and middle cerebral vessels are patent without proximal stenosis, aneurysm or vascular malformation. No missing branch vessels detected. Posterior circulation: No antegrade flow in the distal right vertebral artery. Left vertebral artery is widely patent to the foramen  magnum. There is atherosclerotic disease of the left vertebral artery at the foramen magnum with stenosis of 50%. Beyond that, the vertebral is widely patent to the basilar. Retrograde flow in the right vertebral artery to PICA. Focal stenosis of the basilar artery as demonstrated at MR angiography estimated at 70%. Venous  sinuses: Patent and normal Anatomic variants: None significant Delayed phase: No abnormal enhancement IMPRESSION: Atherosclerotic plaque affecting the right carotid bifurcation. 30% stenosis of the proximal ICA. No left carotid bifurcation stenosis. 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 Left vertebral artery widely patent in the neck. 50% stenosis of the left vertebral artery at the foramen magnum. 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 30% stenosis of the right carotid siphon. Anterior and middle cerebral vessels widely patent. CT the brain does not show any new finding. Electronically Signed   By: Nelson Chimes M.D.   On: 01/29/2016 19:41   Ct Head Wo Contrast  01/28/2016  CLINICAL DATA:  Slurred speech, lethargic for 3 days EXAM: CT HEAD WITHOUT CONTRAST TECHNIQUE: Contiguous axial images were obtained from the base of the skull through the vertex without intravenous contrast. COMPARISON:  MR brain 10/11/2015 FINDINGS: There is no evidence of mass effect, midline shift or extra-axial fluid collections. There is no evidence of a space-occupying lesion or intracranial hemorrhage. There is no evidence of a cortical-based area of acute infarction. There is a small lacunar infarct along the roof of the left lateral ventricle. The ventricles and sulci are appropriate for the patient's age. The basal cisterns are patent. Visualized portions of the orbits are unremarkable. The visualized portions of the paranasal sinuses and mastoid air cells are unremarkable. The osseous structures are unremarkable. IMPRESSION: No acute intracranial pathology. Electronically Signed   By: Kathreen Devoid   On: 01/28/2016 20:31   Ct Angio Neck W Or Wo Contrast  01/29/2016  CLINICAL DATA:  Followup code stroke.  New neurological symptoms. EXAM: CT ANGIOGRAPHY HEAD AND NECK TECHNIQUE: Multidetector CT imaging of the head and neck was performed using the standard protocol during bolus administration of intravenous contrast.  Multiplanar CT image reconstructions and MIPs were obtained to evaluate the vascular anatomy. Carotid stenosis measurements (when applicable) are obtained utilizing NASCET criteria, using the distal internal carotid diameter as the denominator. CONTRAST:  50 cc Isovue 370 COMPARISON:  MR exams same day.  CT 01/28/2016.  MRI 10/11/2015. FINDINGS: CT HEAD No sign of acute infarction by CT. Acute infarction demonstrated in the pons by MRI is not resolved. No cerebellar insult. Old lacunar infarctions in the basal ganglia appear the same. Mild chronic small-vessel ischemic changes of the white matter appear the same. No hemorrhage, hydrocephalus or extra-axial collection. No calvarial abnormality. Sinuses are clear. CTA NECK Aortic arch: Aortic atherosclerosis. No aneurysm or dissection. Previous CABG. Right carotid system: Common carotid artery widely patent to the bifurcation. Atherosclerotic plaque affecting the carotid bifurcation. Minimal diameter in the ICA bulb measures 3.5 mm. Compared to a more distal cervical ICA diameter of 5 mm, this indicates a 30% stenosis. Left carotid system: Common carotid artery widely patent to the bifurcation. Mild atherosclerotic plaque at carotid bifurcation but no stenosis. 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 Left vertebral artery origin widely patent. Left vertebral is a large vessel widely patent through the cervical region. Skeleton: Ordinary mild cervical spondylosis. Other neck: No mass or lymphadenopathy.  Lung apices are clear. CTA HEAD Anterior circulation: Internal carotid arteries are widely patent through the skullbase and siphon regions.  Atherosclerotic calcification in the carotid siphon regions but no stenosis greater than 30%. Supraclinoid internal carotid artery is widely patent. The anterior and middle cerebral vessels are patent without proximal stenosis, aneurysm or vascular malformation. No missing branch vessels detected.  Posterior circulation: No antegrade flow in the distal right vertebral artery. Left vertebral artery is widely patent to the foramen magnum. There is atherosclerotic disease of the left vertebral artery at the foramen magnum with stenosis of 50%. Beyond that, the vertebral is widely patent to the basilar. Retrograde flow in the right vertebral artery to PICA. Focal stenosis of the basilar artery as demonstrated at MR angiography estimated at 70%. Venous sinuses: Patent and normal Anatomic variants: None significant Delayed phase: No abnormal enhancement IMPRESSION: Atherosclerotic plaque affecting the right carotid bifurcation. 30% stenosis of the proximal ICA. No left carotid bifurcation stenosis. 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 Left vertebral artery widely patent in the neck. 50% stenosis of the left vertebral artery at the foramen magnum. 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 30% stenosis of the right carotid siphon. Anterior and middle cerebral vessels widely patent. CT the brain does not show any new finding. Electronically Signed   By: Nelson Chimes M.D.   On: 01/29/2016 19:41   Mr Brain Wo Contrast  01/29/2016  CLINICAL DATA:  Lower extremity weakness and slurred speech beginning yesterday. History of stroke 5 weeks ago, with known RIGHT vertebral artery occlusion. Aphasia and ataxia. History of hypertension, hyperlipidemia, diabetes. EXAM: MRI HEAD WITHOUT CONTRAST TECHNIQUE: Multiplanar, multiecho pulse sequences of the brain and surrounding structures were obtained without intravenous contrast. COMPARISON:  CT HEAD January 28, 2016 and MRI of the head October 11, 2015 FINDINGS: INTRACRANIAL CONTENTS: 3 subcentimeter foci of reduced diffusion LEFT ventral pons with low ADC values. No susceptibility artifact to suggest hemorrhage. The ventricles and sulci are normal for patient's age. Scattered subcentimeter supratentorial white matter FLAIR T2 hyperintensities compatible with chronic small  vessel ischemic disease, a few which demonstrate underlying cystic changes. RIGHT ventral pontine old infarcts. Old bilateral caudate head lacunar infarcts. No suspicious parenchymal signal, masses or mass effect. No abnormal extra-axial fluid collections. No extra-axial masses though, contrast enhanced sequences would be more sensitive. Loss of normal RIGHT vertebral artery flow void. Irregular signal central basilar artery was present previously suggesting thrombus or dissection . ORBITS: The included ocular globes and orbital contents are non-suspicious. SINUSES: The mastoid air-cells and included paranasal sinuses are well-aerated. SKULL/SOFT TISSUES: No abnormal sellar expansion. No suspicious calvarial bone marrow signal. Craniocervical junction maintained. IMPRESSION: Acute small LEFT pontine infarcts. Old RIGHT pontine infarcts. 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 Stable chronic changes including moderate chronic small vessel ischemic disease and old caudate lacunar infarcts. Electronically Signed   By: Elon Alas M.D.   On: 01/29/2016 01:11   [name] Head/brain Wo Cm  01/29/2016  CLINICAL DATA:  60 year old male with acute left brainstem infarct discovered on MRI for lower extremity weakness and slurred speech. Known right vertebral artery occlusion. Initial encounter. EXAM: MRA HEAD WITHOUT CONTRAST TECHNIQUE: Angiographic images of the Circle of Willis were obtained using MRA technique without intravenous contrast. COMPARISON:  Brain MRI 01/29/2016. Brain MRI and intracranial MRA 10/11/2015 FINDINGS: Continued absence of antegrade flow in the distal right vertebral artery. Preserved antegrade flow in the distal left vertebral artery, but increased left V4 irregularity and mild stenosis (series 3, image 119). This is just proximal to the left PICA origin which remains patent.  The left vertebral supplies the basilar. Multifocal basilar  artery irregularity again noted. Suspect progressed chronic stenosis at the distal 3rd basilar artery, moderate to severe (series 306, image 9). AICA origins appear to remain patent. SCA and PCA origins are stable and patent. Left PCA branches are stable. Suggestion of increased and moderate to severe stenosis in the right distal PCA superior division today. Antegrade flow in both ICA siphons is stable. Both ophthalmic artery origins remain patent. No siphon stenosis. Carotid termini are stable and patent. Mild irregularity at the left ICA terminus. The left ACA A1 segment is non dominant. MCA and ACA origins otherwise within normal limits. Anterior communicating artery and visualized bilateral ACA branches are stable and within normal limits. MCA M1 segments and bifurcations are stable with mild irregularity. Visualized bilateral MCA branches are stable, no major branch occlusion. IMPRESSION: 1. 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 2. Chronic distal right vertebral artery occlusion. Progressed mild atherosclerotic stenosis in the distal left vertebral artery. 3. Stable anterior circulation with comparatively mild atherosclerosis. Electronically Signed   By: Genevie Ann M.D.   On: 01/29/2016 08:30    Medications: I have reviewed the patient's current medications. Scheduled Meds: . atorvastatin  80 mg Oral q1800  . fenofibrate  160 mg Oral Daily  . gabapentin  300 mg Oral QHS  . gabapentin  600 mg Oral Daily  . insulin aspart  0-15 Units Subcutaneous TID WC  . insulin aspart  0-5 Units Subcutaneous QHS  . insulin aspart  35 Units Subcutaneous TID AC  . insulin detemir  75 Units Subcutaneous BID  . lisinopril  20 mg Oral Daily  . metoprolol succinate  25 mg Oral Daily   Continuous Infusions: . heparin 1,450 Units/hr (01/30/16 0559)   PRN Meds:.  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 CHA2DS2VASC score of 5 if Neuro agrees would begin NOAC   --Carotid dopplers: 09/2015 Findings consistent with 1-39 percent stenosis involving the right internal carotid artery and the left internal carotid artery. 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 -- Chronic distal right vertebral artery occlusion. Progressed mild atherosclerotic stenosis in the distal left vertebral artery. -- Stable anterior circulation with comparatively mild Atherosclerosis. For cerebral angiogram today.    PAF new, see above, for anticoagulation possible increase BB check TSH  Now on IV heparin.    PVD (peripheral vascular disease) ([place])- recent surgery with Dr. Oneida Alar    Essential hypertension- mostly controlled on toprol 25, lisinopril 20 increase BB to 50 BID if BP allows    Coronary artery disease, history of CABG and graft dysfunction some chest pressure for last day or so, would come and go seems worse with deep breath. Check troponin and EKG   Hypertriglyceridemia- significant --on lipitor 80- on fenofibrate- needs control of diabetes. Needs PCP   Uncontrolled diabetes mellitus with hyperglycemia, with long-term current use of insulin ([place]) followed by IM    Stroke (cerebrum) ([facility])    Probable Sleep apnea, periods of apnea in his sleep will need outpt sleep study.   LOS: 1 day   Time spent with pt. : 10 minutes. To review chart and strips Cecilie Kicks  Nurse Practitioner Certified Pager [phone] or after 5pm and on weekends call [phone] 01/30/2016, 10:47 AM  The patient has been  seen in conjunction with Cecilie Kicks, NP. All aspects of care have been considered and discussed. The patient has been personally interviewed, examined, and all clinical data has been reviewed.   Awaiting cerebral angiography.  Events  over the evening are noted. Agree with IV heparin. We'll start oral Xa-inhibitor therapy (Eliquis) once invasive evaluation is completed. Agree with discontinuing antiplatelet therapy.  Beta blocker therapy for rate control when in atrial fibrillation.  Watch blood pressure closely.

## 2016-01-30 NOTE — Progress Notes (Signed)
Addendum  Status post cerebral angiogram. Diet resumed. Reduce Levemir to 50 units twice a day (missed this morning's dose), continue NovoLog SSI. Mealtime NovoLog discontinued this morning (had been nothing by mouth since morning). Monitor overnight and quickly up titrate insulin's in the morning based on CBGs.  Vernell Leep, MD, FACP, FHM. [facility] Pager [phone]  If 7PM-7AM, please contact night-coverage [email or web address] Password [email or web address] 01/30/2016, 4:55 PM

## 2016-01-30 NOTE — Care Management Note (Signed)
Case Management Note  Patient Details  Name: [name] MRN: [id number] Date of Birth: 1956/06/27  Subjective/Objective:    Pt admitted with CVA. He is from home with his spouse.                 Action/Plan: PT recommends outpatient services. Awaiting OT recommendations. CM following for discharge needs.   Expected Discharge Date:  01/31/16               Expected Discharge Plan:  Home/Self Care  In-House Referral:     Discharge planning Services     Post Acute Care Choice:    Choice offered to:     DME Arranged:    DME Agency:     HH Arranged:    HH Agency:     Status of Service:  In process, will continue to follow  If discussed at Long Length of Stay Meetings, dates discussed:    Additional Comments:  Pollie Friar, RN 01/30/2016, 12:05 PM

## 2016-01-30 NOTE — Procedures (Signed)
S/P bilateral common carotid and LT Vertbral arteriogram. Rt cfa APPROACH. FINDINGS. 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 2.Mild to moderate stenosis of RT ICA supraclinoid seg. 3.Occluded RT VA , with no distal reconstituition. 4 NO PCOMS bilaterally

## 2016-01-30 NOTE — Progress Notes (Addendum)
Inpatient Diabetes Program Recommendations  AACE/ADA: New Consensus Statement on Inpatient Glycemic Control (2015)  Target Ranges:  Prepandial:   less than 140 mg/dL      Peak postprandial:   less than 180 mg/dL (1-2 hours)      Critically ill patients:  140 - 180 mg/dL  Results for SHONDALE, SLAVINSKI (MRN [id number]) as of 01/30/2016 11:34  Ref. Range 01/29/2016 20:21 01/30/2016 00:06 01/30/2016 03:59 01/30/2016 09:01 01/30/2016 10:45  Glucose-Capillary Latest Ref Range: 65-99 mg/dL 231 (H) 301 (H) 270 (H) 241 (H) 213 (H)   Review of Glycemic Control  Diabetes history: DM 2 Outpatient Diabetes medications: Levemir 75 units bid + Nov 35 tid + Metformin 500 bid Current orders for Inpatient glycemic control: Levemir 75 bid + Nov 35 tid + 0-9 tid Inpatient Diabetes Program Recommendations:  Patient received 35 units Novolog this am (patient is NPO). Spoke with nurse Deedre to clarify insulin dose and requested to check CBGs frequently to determine if post hypoglycemia. If patient is NPO, will need decrease in Levemir dosage  (0.5 units/ kg = 55 units daily) and change Novolog correction 0-20 to q 4 hrs. While NPO and stop meal coverage.  Thank you, Nani Gasser. Teresita Fanton, RN, MSN, CDE Inpatient Glycemic Control Team Team Pager [phone] (8am-5pm) 01/30/2016 11:42 AM

## 2016-01-30 NOTE — Progress Notes (Signed)
ANTICOAGULATION CONSULT NOTE - Follow-up Consult  Pharmacy Consult for Heparin Indication: atrial fibrillation and stroke  No Known Allergies  Patient Measurements: Height: 5\' 11"  (180.3 cm) Weight: 240 lb (108.863 kg) IBW/kg (Calculated) : 75.3 Heparin Dosing Weight: 98 kg  Vital Signs: Temp: 98.2 F (36.8 C) (07/13 0212) Temp Source: Oral (07/13 0212) BP: 131/71 mmHg (07/13 0212) Pulse Rate: 83 (07/13 0212)  Labs:  Recent Labs  01/28/16 1951 01/29/16 0540 01/29/16 1516 01/30/16 0425 01/30/16 0434  HGB 13.4 12.9*  --  12.6*  --   HCT 40.4 39.9  --  38.8*  --   PLT 310 271  --  289  --   HEPARINUNFRC  --   --   --   --  <0.10*  CREATININE 0.95 0.95  --   --   --   TROPONINI  --   --  <0.03  --   --     Estimated Creatinine Clearance: 102.4 mL/min (by C-G formula based on Cr of 0.95).  Assessment: 60yo male presents with stroke and is found to be in Afib. Pharmacy is consulted to dose heparin for stroke - recommended by neurology. Heparin level undetectable on 1200 units/hr. CBC stable. No issues with bleeding reported per RN. Heparin line was off/occluded from ~2316 to midnight so this level is only ~4.5 hrs post restart.  Goal of Therapy:  Heparin level 0.3-0.5 units/ml Monitor platelets by anticoagulation protocol: Yes   Plan:  Increase heparin to 1450 units/hr F/u 6 hr heparin level  Sherlon Handing, PharmD, BCPS Clinical pharmacist, pager [phone] 01/30/2016,5:33 AM

## 2016-01-30 NOTE — Progress Notes (Signed)
RN spoke with pharmacy technician. Heparin increased to 14.5 ml/h. Pharmacy tech informed that patient required peripheral IV maintenance and infusion was reconnected after 0000 on 01/30/2016. RN will increase infusion and continue to monitor patient.

## 2016-01-30 NOTE — Sedation Documentation (Signed)
Pt rerurning to floor- MD has emergency case. Will try to do his case later today. MD spoke with pt.

## 2016-01-30 NOTE — Progress Notes (Signed)
PT Cancellation Note  Patient Details Name: [name] MRN: [id number] DOB: 1955-12-19   Cancelled Treatment:    Reason Eval/Treat Not Completed: Patient not medically ready Pt recently back from medical procedure on bed rest   Nash M[place], [place] [id number]  01/30/2016, 4:18 PM

## 2016-01-30 NOTE — Progress Notes (Signed)
STROKE TEAM PROGRESS NOTE    SUBJECTIVE (INTERVAL HISTORY) His wife and daughter and other family members are at the bedside. He was supposed to have a cerebral angio today but has been delayed given acute stroke in the ED. Had neuro worsening over night for which IB heparin was started. He also was confirmed to have atrial fibrillation, again, on IV heparin with recommendations for [place]. Stat CT done with neuro symptoms worsening during the night, stable. RN unsure rhythm at that time. RN reports waxing and waning of symptoms. Patient feels dysarthria better today than yesterday.    OBJECTIVE Temp:  [97.7 F (36.5 C)-98.2 F (36.8 C)] 97.9 F (36.6 C) (07/13 0930) Pulse Rate:  [56-93] 80 (07/13 1116) Cardiac Rhythm:  [-] Atrial fibrillation (07/13 1258) Resp:  [16-20] 20 (07/13 1116) BP: (105-142)/(65-77) 128/71 mmHg (07/13 1116) SpO2:  [96 %-99 %] 96 % (07/13 1116)  CBC:   Recent Labs Lab 01/29/16 0540 01/30/16 0425  WBC 9.4 10.4  HGB 12.9* 12.6*  HCT 39.9 38.8*  MCV 86.4 86.6  PLT 271 [id number]    Basic Metabolic Panel:   Recent Labs Lab 01/29/16 0540 01/30/16 0425  NA 136 135  K 4.4 4.1  CL 102 102  CO2 25 26  GLUCOSE 357* 251*  BUN 17 19  CREATININE 0.95 0.88  CALCIUM 9.4 9.3    Lipid Panel:     Component Value Date/Time   CHOL 523* 01/29/2016 0538   TRIG 1925* 01/29/2016 0538   HDL NOT REPORTED DUE TO HIGH TRIGLYCERIDES 01/29/2016 0538   CHOLHDL NOT REPORTED DUE TO HIGH TRIGLYCERIDES 01/29/2016 0538   VLDL UNABLE TO CALCULATE IF TRIGLYCERIDE OVER 400 mg/dL 01/29/2016 0538   LDLCALC UNABLE TO CALCULATE IF TRIGLYCERIDE OVER 400 mg/dL 01/29/2016 0538   HgbA1c:  Lab Results  Component Value Date   HGBA1C 11.5* 01/29/2016   Urine Drug Screen:     Component Value Date/Time   LABOPIA NONE DETECTED 01/29/2016 1326   COCAINSCRNUR NONE DETECTED 01/29/2016 1326   LABBENZ NONE DETECTED 01/29/2016 1326   AMPHETMU NONE DETECTED 01/29/2016 1326   THCU NONE  DETECTED 01/29/2016 1326   LABBARB NONE DETECTED 01/29/2016 1326      IMAGING  Ct Angio Head W Or Wo Contrast  01/29/2016  CLINICAL DATA:  Followup code stroke.  New neurological symptoms. EXAM: CT ANGIOGRAPHY HEAD AND NECK TECHNIQUE: Multidetector CT imaging of the head and neck was performed using the standard protocol during bolus administration of intravenous contrast. Multiplanar CT image reconstructions and MIPs were obtained to evaluate the vascular anatomy. Carotid stenosis measurements (when applicable) are obtained utilizing NASCET criteria, using the distal internal carotid diameter as the denominator. CONTRAST:  50 cc Isovue 370 COMPARISON:  MR exams same day.  CT 01/28/2016.  MRI 10/11/2015. FINDINGS: CT HEAD No sign of acute infarction by CT. Acute infarction demonstrated in the pons by MRI is not resolved. No cerebellar insult. Old lacunar infarctions in the basal ganglia appear the same. Mild chronic small-vessel ischemic changes of the white matter appear the same. No hemorrhage, hydrocephalus or extra-axial collection. No calvarial abnormality. Sinuses are clear. CTA NECK Aortic arch: Aortic atherosclerosis. No aneurysm or dissection. Previous CABG. Right carotid system: Common carotid artery widely patent to the bifurcation. Atherosclerotic plaque affecting the carotid bifurcation. Minimal diameter in the ICA bulb measures 3.5 mm. Compared to a more distal cervical ICA diameter of 5 mm, this indicates a 30% stenosis. Left carotid system: Common carotid artery widely patent to the  bifurcation. Mild atherosclerotic plaque at carotid bifurcation but no stenosis. 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 Left vertebral artery origin widely patent. Left vertebral is a large vessel widely patent through the cervical region. Skeleton: Ordinary mild cervical spondylosis. Other neck: No mass or lymphadenopathy.  Lung apices are clear. CTA HEAD Anterior circulation:  Internal carotid arteries are widely patent through the skullbase and siphon regions. Atherosclerotic calcification in the carotid siphon regions but no stenosis greater than 30%. Supraclinoid internal carotid artery is widely patent. The anterior and middle cerebral vessels are patent without proximal stenosis, aneurysm or vascular malformation. No missing branch vessels detected. Posterior circulation: No antegrade flow in the distal right vertebral artery. Left vertebral artery is widely patent to the foramen magnum. There is atherosclerotic disease of the left vertebral artery at the foramen magnum with stenosis of 50%. Beyond that, the vertebral is widely patent to the basilar. Retrograde flow in the right vertebral artery to PICA. Focal stenosis of the basilar artery as demonstrated at MR angiography estimated at 70%. Venous sinuses: Patent and normal Anatomic variants: None significant Delayed phase: No abnormal enhancement IMPRESSION: Atherosclerotic plaque affecting the right carotid bifurcation. 30% stenosis of the proximal ICA. No left carotid bifurcation stenosis. 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 Left vertebral artery widely patent in the neck. 50% stenosis of the left vertebral artery at the foramen magnum. 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 30% stenosis of the right carotid siphon. Anterior and middle cerebral vessels widely patent. CT the brain does not show any new finding. Electronically Signed   By: Nelson Chimes M.D.   On: 01/29/2016 19:41   Ct Head Wo Contrast  01/29/2016  CLINICAL DATA:  Followup code stroke.  New neurological symptoms. EXAM: CT ANGIOGRAPHY HEAD AND NECK TECHNIQUE: Multidetector CT imaging of the head and neck was performed using the standard protocol during bolus administration of intravenous contrast. Multiplanar CT image reconstructions and MIPs were obtained to evaluate the vascular anatomy. Carotid stenosis measurements (when applicable) are obtained  utilizing NASCET criteria, using the distal internal carotid diameter as the denominator. CONTRAST:  50 cc Isovue 370 COMPARISON:  MR exams same day.  CT 01/28/2016.  MRI 10/11/2015. FINDINGS: CT HEAD No sign of acute infarction by CT. Acute infarction demonstrated in the pons by MRI is not resolved. No cerebellar insult. Old lacunar infarctions in the basal ganglia appear the same. Mild chronic small-vessel ischemic changes of the white matter appear the same. No hemorrhage, hydrocephalus or extra-axial collection. No calvarial abnormality. Sinuses are clear. CTA NECK Aortic arch: Aortic atherosclerosis. No aneurysm or dissection. Previous CABG. Right carotid system: Common carotid artery widely patent to the bifurcation. Atherosclerotic plaque affecting the carotid bifurcation. Minimal diameter in the ICA bulb measures 3.5 mm. Compared to a more distal cervical ICA diameter of 5 mm, this indicates a 30% stenosis. Left carotid system: Common carotid artery widely patent to the bifurcation. Mild atherosclerotic plaque at carotid bifurcation but no stenosis. 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 Left vertebral artery origin widely patent. Left vertebral is a large vessel widely patent through the cervical region. Skeleton: Ordinary mild cervical spondylosis. Other neck: No mass or lymphadenopathy.  Lung apices are clear. CTA HEAD Anterior circulation: Internal carotid arteries are widely patent through the skullbase and siphon regions. Atherosclerotic calcification in the carotid siphon regions but no stenosis greater than 30%. Supraclinoid internal carotid artery is widely patent. The anterior and middle cerebral vessels are patent  without proximal stenosis, aneurysm or vascular malformation. No missing branch vessels detected. Posterior circulation: No antegrade flow in the distal right vertebral artery. Left vertebral artery is widely patent to the foramen magnum. There is  atherosclerotic disease of the left vertebral artery at the foramen magnum with stenosis of 50%. Beyond that, the vertebral is widely patent to the basilar. Retrograde flow in the right vertebral artery to PICA. Focal stenosis of the basilar artery as demonstrated at MR angiography estimated at 70%. Venous sinuses: Patent and normal Anatomic variants: None significant Delayed phase: No abnormal enhancement IMPRESSION: Atherosclerotic plaque affecting the right carotid bifurcation. 30% stenosis of the proximal ICA. No left carotid bifurcation stenosis. 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 Left vertebral artery widely patent in the neck. 50% stenosis of the left vertebral artery at the foramen magnum. 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 30% stenosis of the right carotid siphon. Anterior and middle cerebral vessels widely patent. CT the brain does not show any new finding. Electronically Signed   By: Nelson Chimes M.D.   On: 01/29/2016 19:41   Ct Head Wo Contrast  01/28/2016  CLINICAL DATA:  Slurred speech, lethargic for 3 days EXAM: CT HEAD WITHOUT CONTRAST TECHNIQUE: Contiguous axial images were obtained from the base of the skull through the vertex without intravenous contrast. COMPARISON:  MR brain 10/11/2015 FINDINGS: There is no evidence of mass effect, midline shift or extra-axial fluid collections. There is no evidence of a space-occupying lesion or intracranial hemorrhage. There is no evidence of a cortical-based area of acute infarction. There is a small lacunar infarct along the roof of the left lateral ventricle. The ventricles and sulci are appropriate for the patient's age. The basal cisterns are patent. Visualized portions of the orbits are unremarkable. The visualized portions of the paranasal sinuses and mastoid air cells are unremarkable. The osseous structures are unremarkable. IMPRESSION: No acute intracranial pathology. Electronically Signed   By: Kathreen Devoid   On: 01/28/2016  20:31   Ct Angio Neck W Or Wo Contrast  01/29/2016  CLINICAL DATA:  Followup code stroke.  New neurological symptoms. EXAM: CT ANGIOGRAPHY HEAD AND NECK TECHNIQUE: Multidetector CT imaging of the head and neck was performed using the standard protocol during bolus administration of intravenous contrast. Multiplanar CT image reconstructions and MIPs were obtained to evaluate the vascular anatomy. Carotid stenosis measurements (when applicable) are obtained utilizing NASCET criteria, using the distal internal carotid diameter as the denominator. CONTRAST:  50 cc Isovue 370 COMPARISON:  MR exams same day.  CT 01/28/2016.  MRI 10/11/2015. FINDINGS: CT HEAD No sign of acute infarction by CT. Acute infarction demonstrated in the pons by MRI is not resolved. No cerebellar insult. Old lacunar infarctions in the basal ganglia appear the same. Mild chronic small-vessel ischemic changes of the white matter appear the same. No hemorrhage, hydrocephalus or extra-axial collection. No calvarial abnormality. Sinuses are clear. CTA NECK Aortic arch: Aortic atherosclerosis. No aneurysm or dissection. Previous CABG. Right carotid system: Common carotid artery widely patent to the bifurcation. Atherosclerotic plaque affecting the carotid bifurcation. Minimal diameter in the ICA bulb measures 3.5 mm. Compared to a more distal cervical ICA diameter of 5 mm, this indicates a 30% stenosis. Left carotid system: Common carotid artery widely patent to the bifurcation. Mild atherosclerotic plaque at carotid bifurcation but no stenosis. 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 Left vertebral artery origin widely patent. Left vertebral is a large vessel widely patent through the cervical region.  Skeleton: Ordinary mild cervical spondylosis. Other neck: No mass or lymphadenopathy.  Lung apices are clear. CTA HEAD Anterior circulation: Internal carotid arteries are widely patent through the skullbase and siphon  regions. Atherosclerotic calcification in the carotid siphon regions but no stenosis greater than 30%. Supraclinoid internal carotid artery is widely patent. The anterior and middle cerebral vessels are patent without proximal stenosis, aneurysm or vascular malformation. No missing branch vessels detected. Posterior circulation: No antegrade flow in the distal right vertebral artery. Left vertebral artery is widely patent to the foramen magnum. There is atherosclerotic disease of the left vertebral artery at the foramen magnum with stenosis of 50%. Beyond that, the vertebral is widely patent to the basilar. Retrograde flow in the right vertebral artery to PICA. Focal stenosis of the basilar artery as demonstrated at MR angiography estimated at 70%. Venous sinuses: Patent and normal Anatomic variants: None significant Delayed phase: No abnormal enhancement IMPRESSION: Atherosclerotic plaque affecting the right carotid bifurcation. 30% stenosis of the proximal ICA. No left carotid bifurcation stenosis. 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 Left vertebral artery widely patent in the neck. 50% stenosis of the left vertebral artery at the foramen magnum. 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 30% stenosis of the right carotid siphon. Anterior and middle cerebral vessels widely patent. CT the brain does not show any new finding. Electronically Signed   By: Nelson Chimes M.D.   On: 01/29/2016 19:41   Mr Brain Wo Contrast  01/29/2016  CLINICAL DATA:  Lower extremity weakness and slurred speech beginning yesterday. History of stroke 5 weeks ago, with known RIGHT vertebral artery occlusion. Aphasia and ataxia. History of hypertension, hyperlipidemia, diabetes. EXAM: MRI HEAD WITHOUT CONTRAST TECHNIQUE: Multiplanar, multiecho pulse sequences of the brain and surrounding structures were obtained without intravenous contrast. COMPARISON:  CT HEAD January 28, 2016 and MRI of the head October 11, 2015 FINDINGS:  INTRACRANIAL CONTENTS: 3 subcentimeter foci of reduced diffusion LEFT ventral pons with low ADC values. No susceptibility artifact to suggest hemorrhage. The ventricles and sulci are normal for patient's age. Scattered subcentimeter supratentorial white matter FLAIR T2 hyperintensities compatible with chronic small vessel ischemic disease, a few which demonstrate underlying cystic changes. RIGHT ventral pontine old infarcts. Old bilateral caudate head lacunar infarcts. No suspicious parenchymal signal, masses or mass effect. No abnormal extra-axial fluid collections. No extra-axial masses though, contrast enhanced sequences would be more sensitive. Loss of normal RIGHT vertebral artery flow void. Irregular signal central basilar artery was present previously suggesting thrombus or dissection . ORBITS: The included ocular globes and orbital contents are non-suspicious. SINUSES: The mastoid air-cells and included paranasal sinuses are well-aerated. SKULL/SOFT TISSUES: No abnormal sellar expansion. No suspicious calvarial bone marrow signal. Craniocervical junction maintained. IMPRESSION: Acute small LEFT pontine infarcts. Old RIGHT pontine infarcts. 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 Stable chronic changes including moderate chronic small vessel ischemic disease and old caudate lacunar infarcts. Electronically Signed   By: Elon Alas M.D.   On: 01/29/2016 01:11   [name] Head/brain Wo Cm  01/29/2016  CLINICAL DATA:  60 year old male with acute left brainstem infarct discovered on MRI for lower extremity weakness and slurred speech. Known right vertebral artery occlusion. Initial encounter. EXAM: MRA HEAD WITHOUT CONTRAST TECHNIQUE: Angiographic images of the Circle of Willis were obtained using MRA technique without intravenous contrast. COMPARISON:  Brain MRI 01/29/2016. Brain MRI and intracranial MRA 10/11/2015 FINDINGS: Continued absence of  antegrade flow in the distal right vertebral artery.  Preserved antegrade flow in the distal left vertebral artery, but increased left V4 irregularity and mild stenosis (series 3, image 119). This is just proximal to the left PICA origin which remains patent. The left vertebral supplies the basilar. Multifocal basilar artery irregularity again noted. Suspect progressed chronic stenosis at the distal 3rd basilar artery, moderate to severe (series 306, image 9). AICA origins appear to remain patent. SCA and PCA origins are stable and patent. Left PCA branches are stable. Suggestion of increased and moderate to severe stenosis in the right distal PCA superior division today. Antegrade flow in both ICA siphons is stable. Both ophthalmic artery origins remain patent. No siphon stenosis. Carotid termini are stable and patent. Mild irregularity at the left ICA terminus. The left ACA A1 segment is non dominant. MCA and ACA origins otherwise within normal limits. Anterior communicating artery and visualized bilateral ACA branches are stable and within normal limits. MCA M1 segments and bifurcations are stable with mild irregularity. Visualized bilateral MCA branches are stable, no major branch occlusion. IMPRESSION: 1. 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 2. Chronic distal right vertebral artery occlusion. Progressed mild atherosclerotic stenosis in the distal left vertebral artery. 3. Stable anterior circulation with comparatively mild atherosclerosis. Electronically Signed   By: Genevie Ann M.D.   On: 01/29/2016 08:30    PHYSICAL EXAM Exam: Gen: NAD, conversant                CV: RRR, no MRG. No Carotid Bruits. No peripheral edema, warm, nontender Eyes: Conjunctivae clear without exudates or hemorrhage  Neuro: Detailed Neurologic Exam  Speech:    Speech is mildly dysarthric Cognition:    The patient  is oriented to person, place, and time;  Cranial Nerves:    The pupils are equal, round, and reactive to light. The fundi are normal and spontaneous venous pulsations are present. Visual fields are full to finger confrontation. Extraocular movements are intact. Trigeminal sensation is intact and the muscles of mastication are normal. Left droop. The palate elevates in the midline. Hearing intact. Voice is normal. Shoulder shrug is normal. The tongue has normal motion without fasciculations.   Coordination:    No dysmetria  Motor Observation:    No asymmetry, no atrophy, and no involuntary movements noted. Tone:    Normal muscle tone.     Strength:    Strength is V/V in the upper and lower limbs.      Sensation: intact to LT  DTR's:    Deep tendon reflexes in the upper and lower extremities are normal bilaterally.   Toes:    The toes are downgoing bilaterally.   Clonus:    Clonus is absent.    ASSESSMENT/PLAN [name] is a 60 y.o. male with history of stroke in March 2017, CAD status post CABG, diabetes mellitus type 2, hypertriglyceridemia, peripheral vascular disease status post femoro/tibial bypass in February 2017 presenting with expressive aphasia and dizziness. He did not receive IV t-PA due to delay in arrival.   Stroke:  L pontine infarct in setting of recent R pontine infarct with progressive BA stenosis  MRI  Acute small left pontine infarct. Old right pontine infarct. 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 Chronic right VA occlusion.progress atherosclerotic disease left VA. Stable anterior circulation  Carotid Doppler  No carotid disease per Doppler done March 2017  2D Echo  No source of embolus, EF 55-60% per echo done March 2017. 2-D echo this admission pending.  Cerebral angiogram pending  (progressive basilar artery stenosis, ? Need for stent. Concerned for dual antiplatelets in setting of anticoagulation)  LDL unable to calculate  HgbA1c 10.3 and March  Lovenox 40 mg sq daily for VTE prophylaxis Diet NPO time specified Except for: Sips with Meds  aspirin 325 mg daily and clopidogrel 75 mg daily prior to admission, now on aspirin 325 mg daily and clopidogrel 75 mg daily  Patient counseled to be compliant with his antithrombotic medications  Ongoing aggressive stroke risk factor management  Therapy recommendations:  OP OT, PT  Disposition:  pending   Take it easy today, monitor  Consider discharge tomorrow pending testing   Atrial Fibrillation  New diagnosis  Not felt to be cause of recurrent posterior circulation infarcts  Agree with IV heparin with transition to [facility] following cerebral angiogram   Hypertension  Stable  Permissive hypertension (OK if < 220/120) but gradually normalize in 5-7 days  Long-term BP goal normotensive  Hyperlipidemia Hypertriglyceridemia  Home meds:  Lipitor 80, and TriCor 145, resumed in hospital  LDL unable to calculate, goal < 70  Lipids poorly controlled  Continue statin at discharge  Diabetes type II  HgbA1c 10.3 in March, goal < 7.0  Uncontrolled  Other Stroke Risk Factors  Obesity, Body mass index is 33.49 kg/(m^2)., recommend weight loss, diet and exercise as appropriate   Hx stroke/TIA  R pontine infarct i setting of R VA occlusion and moderate to severe VA stenosis  Coronary artery disease s/p CABG 2002  PVD s/p feb-tib bypass 08/2015  Hospital day # [employer] for Pager information 01/30/2016 1:10 PM  I have personally examined this patient, reviewed notes, independently viewed imaging studies, participated in medical decision making and plan of care. I have made any additions or clarifications directly to the above note. Agree with note above. The  patient has symptomatic severe basilar stenosis as well as new onset atrial fibrillation flutter which puts him at significant risk for recurrent stroke and TIAs. He has failed medical therapy and may be considered for endovascular treatment with basilar angioplasty/stenting but he is at high risk for bleeding as he likely will need to stay on long-term anticoagulation plus antiplatelet therapy if he gets a stent. I had a long discussion with the patient, wife, daughter and discussed this difficult and tricky situation. They voiced understanding. Greater than 50% time during this 35 minute visit was spent on counseling and coordination of care about his stroke risk, prevention and treatment options  Antony Contras, MD Medical Director [facility] Pager: [phone] 01/30/2016 4:36 PM   To contact Stroke Continuity provider, please refer to [email or web address] After hours, contact General Neurology

## 2016-01-30 NOTE — Progress Notes (Signed)
PROGRESS NOTE  [name]  [id number] DOB: 06/03/1956  DOA: 01/28/2016 PCP: No PCP Per Patient  Primary Cardiologist: Dr. Quay Burow.  Brief Narrative:  60 y.o. male with history of stroke in March 2017, CAD status post CABG, DM type 2, HLD, PAD status post femoro/tibial bypass in February 2017, HTN presents to the ER because of persistent difficulty with expressive aphasia and dizziness. Patient's symptoms started on 01/27/2016 when he woke up in the morning. CT of the head did not show anything acute. MRI of the brain shows left pontine infarct. Neurology consulted. Cardiology consulted for evaluation of new onset A. fib.   Assessment & Plan:   Principal Problem:   Left pontine stroke ([place]) Active Problems:   PVD (peripheral vascular disease) ([place])   Essential hypertension   Coronary artery disease, history of CABG   Hypertriglyceridemia   Uncontrolled diabetes mellitus with hyperglycemia, with long-term current use of insulin ([facility])   Stroke (cerebrum) ([facility])   Occlusion and stenosis of basilar artery   Acute CVA (cerebrovascular accident) ([place])   Paroxysmal atrial fibrillation ([place])   Acute left brain stroke: Left pontine infarct in the setting of recent right pontine infarct with progressive basilar artery stenosis - Resultant dysarthria and facial asymmetry - CT head 7/11: No acute intracranial pathology. - MRI brain 01/29/16: Acute small left pontine infarct. Old right pontine infarcts. 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 Repeat 2-D echo this admission is pending. - Carotid Dopplers: 10/11/15: 1-39 percent stenosis involving  the right internal carotid artery and the left internal carotid artery. - CT Brain 01/29/16: No new finding. - CTA head 01/29/16:30% stenosis of the proximal ICA. 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 Left vertebral artery widely patent in the neck. 50% stenosis of the left vertebral artery at the foramen magnum. 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 30% stenosis of the right carotid siphon. - LDL: Unable to calculate due to hypertriglyceridemia - A1c: 10.3 on 10/11/15. Repeat hemoglobin A1c 7/12:11.5 - Patient was on aspirin 325 MG daily and Plavix 75 MG daily prior to admission, was continued on these on admission until evening of 7/12 when he developed worsening of neurological symptoms. Repeat CTA head was performed following which neurology discontinued aspirin and Plavix and placed him on IV heparin drip pending cerebral angiography 7/13.  - Cardiology consulted and recommend starting Eliquis once invasive evaluation is completed at which time antiplatelet therapy to be discontinued. - Etiology: ? Embolic. - PT, OT and ST evaluation: Outpatient PT. Awaiting others to evaluate.  Paroxysmal A. Fib - New diagnosis - Currently on IV heparin drip awaiting cerebral angiogram. - Cardiology consulted and recommend starting Eliquis once invasive evaluation is completed at which time antiplatelet therapy to be discontinued.   Type II DM, uncontrolled with peripheral neuropathy - A1c: 10.3 on 10/11/15. Follow repeat A1c: 11.5 suggesting poor outpatient control. Patient on high doses of Levemir and aspart insulin. SSI added. Monitor closely. Will need further adjustment. - 7/12 PM dose of Levemir not given by RN due to concern for hypoglycemia. Patient was nothing by mouth after midnight last night for cerebral angiogram this morning. Missed a.m. dose of Levemir but did receive NovoLog-CBGs monitored closely without hypoglycemia. If diet  not resume post cerebral angiogram, we'll have to cut back  on Levemir dose. Meal NovoLog currently held. Discussed at length with patient's RN  Essential hypertension - Allowing for permissive hypertension given recent stroke. HCTZ held. Lisinopril and metoprolol continue.  Dyslipidemia - Cholesterol 523, triglycerides 1925, unable to calculate HDL, LDL or VLDL. - Continue atorvastatin 80 mg daily and fenofibrate 160 daily. May need further titration.  CAD status post CABG - Asymptomatic of chest pain  PAD status post left lower extremity bypass February 2017 - No acute issues. If anticoagulation started for suspected A. fib, we will need to stop aspirin and Plavix.   DVT prophylaxis:Currently on IV heparin  Code Status: Full Family Communication: Discussed with patient's male friend at bedside. Disposition Plan: DC home when medically stable.   Consultants:   Neurology  Cardiology  Procedures:   None  Antimicrobials:   None    Subjective: Overnight events noted. Acute onset of worsening left facial droop, slurred speech and lower extremity weakness. Patient and friend stated that this lasted for about 45 minutes to an hour and then gradually improved. This morning indicates that he feels much better. Speech has improved but not back to baseline. States that he ambulated to the bathroom with assistance. Patient was seen prior to procedure.  Objective:  Filed Vitals:   01/30/16 1506 01/30/16 1510 01/30/16 1519 01/30/16 1522  BP: 133/72 129/75 126/73 146/76  Pulse: 84 85 88 88  Temp:      TempSrc:      Resp: 15 14 17 15   Height:      Weight:      SpO2: 96% 96% 98% 94%   No intake or output data in the 24 hours ending 01/30/16 1548 Filed Weights   01/28/16 1933 01/29/16 0327  Weight: 107.956 kg (238 lb) 108.863 kg (240 lb)    Examination:  General exam: Pleasant middle-aged male sitting up comfortably in bed this morning. Respiratory system: Clear to auscultation. Respiratory effort normal. Cardiovascular system:  S1 & S2 heard, RRR. No JVD, murmurs, rubs, gallops or clicks. No pedal edema. Telemetry: sinus rhythm. Gastrointestinal system: Abdomen is nondistended, soft and nontender. No organomegaly or masses felt. Normal bowel sounds heard. Central nervous system: Alert and oriented. Dysarthria +. Mild right lower facial weakness (diminished right nasolabial fold). Findings about the same as 7/12 AM.  Extremities: Symmetric 5 x 5 power. No pronator drift. Surgical scar left mid thigh. Skin: No rashes, lesions or ulcers Psychiatry: Judgement and insight appear normal. Mood & affect appropriate.     Data Reviewed: I have personally reviewed following labs and imaging studies  CBC:  Recent Labs Lab 01/28/16 1951 01/29/16 0540 01/30/16 0425  WBC 10.2 9.4 10.4  HGB 13.4 12.9* 12.6*  HCT 40.4 39.9 38.8*  MCV 86.0 86.4 86.6  PLT 310 271 [id number]   Basic Metabolic Panel:  Recent Labs Lab 01/28/16 1951 01/29/16 0540 01/30/16 0425  NA 133* 136 135  K 3.9 4.4 4.1  CL 100* 102 102  CO2 23 25 26   GLUCOSE 390* 357* 251*  BUN 16 17 19   CREATININE 0.95 0.95 0.88  CALCIUM 9.8 9.4 9.3   GFR: Estimated Creatinine Clearance: 110.6 mL/min (by C-G formula based on Cr of 0.88). Liver Function Tests:  Recent Labs Lab 01/29/16 0540  AST 17  ALT 19  ALKPHOS 74  BILITOT 0.7  PROT 6.5  ALBUMIN 3.4*   No results for input(s): LIPASE, AMYLASE in the last 168 hours. No results  for input(s): AMMONIA in the last 168 hours. Coagulation Profile:  Recent Labs Lab 01/30/16 0425  INR 1.05   Cardiac Enzymes:  Recent Labs Lab 01/29/16 1516  TROPONINI <0.03   BNP (last 3 results) No results for input(s): PROBNP in the last 8760 hours. HbA1C:  Recent Labs  01/29/16 0538  HGBA1C 11.5*   CBG:  Recent Labs Lab 01/30/16 0359 01/30/16 0901 01/30/16 1045 01/30/16 1221 01/30/16 1351  GLUCAP 270* 241* 213* 156* 110*   Lipid Profile:  Recent Labs  01/29/16 0538  CHOL 523*  HDL NOT  REPORTED DUE TO HIGH TRIGLYCERIDES  LDLCALC UNABLE TO CALCULATE IF TRIGLYCERIDE OVER 400 mg/dL  TRIG 1925*  CHOLHDL NOT REPORTED DUE TO HIGH TRIGLYCERIDES   Thyroid Function Tests:  Recent Labs  01/29/16 1516  TSH 2.497   Anemia Panel: No results for input(s): VITAMINB12, FOLATE, FERRITIN, TIBC, IRON, RETICCTPCT in the last 72 hours.  Sepsis Labs: No results for input(s): PROCALCITON, LATICACIDVEN in the last 168 hours.  No results found for this or any previous visit (from the past 240 hour(s)).       Radiology Studies: Ct Angio Head W Or Wo Contrast  01/29/2016  CLINICAL DATA:  Followup code stroke.  New neurological symptoms. EXAM: CT ANGIOGRAPHY HEAD AND NECK TECHNIQUE: Multidetector CT imaging of the head and neck was performed using the standard protocol during bolus administration of intravenous contrast. Multiplanar CT image reconstructions and MIPs were obtained to evaluate the vascular anatomy. Carotid stenosis measurements (when applicable) are obtained utilizing NASCET criteria, using the distal internal carotid diameter as the denominator. CONTRAST:  50 cc Isovue 370 COMPARISON:  MR exams same day.  CT 01/28/2016.  MRI 10/11/2015. FINDINGS: CT HEAD No sign of acute infarction by CT. Acute infarction demonstrated in the pons by MRI is not resolved. No cerebellar insult. Old lacunar infarctions in the basal ganglia appear the same. Mild chronic small-vessel ischemic changes of the white matter appear the same. No hemorrhage, hydrocephalus or extra-axial collection. No calvarial abnormality. Sinuses are clear. CTA NECK Aortic arch: Aortic atherosclerosis. No aneurysm or dissection. Previous CABG. Right carotid system: Common carotid artery widely patent to the bifurcation. Atherosclerotic plaque affecting the carotid bifurcation. Minimal diameter in the ICA bulb measures 3.5 mm. Compared to a more distal cervical ICA diameter of 5 mm, this indicates a 30% stenosis. Left carotid  system: Common carotid artery widely patent to the bifurcation. Mild atherosclerotic plaque at carotid bifurcation but no stenosis. 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 Left vertebral artery origin widely patent. Left vertebral is a large vessel widely patent through the cervical region. Skeleton: Ordinary mild cervical spondylosis. Other neck: No mass or lymphadenopathy.  Lung apices are clear. CTA HEAD Anterior circulation: Internal carotid arteries are widely patent through the skullbase and siphon regions. Atherosclerotic calcification in the carotid siphon regions but no stenosis greater than 30%. Supraclinoid internal carotid artery is widely patent. The anterior and middle cerebral vessels are patent without proximal stenosis, aneurysm or vascular malformation. No missing branch vessels detected. Posterior circulation: No antegrade flow in the distal right vertebral artery. Left vertebral artery is widely patent to the foramen magnum. There is atherosclerotic disease of the left vertebral artery at the foramen magnum with stenosis of 50%. Beyond that, the vertebral is widely patent to the basilar. Retrograde flow in the right vertebral artery to PICA. Focal stenosis of the basilar artery as demonstrated at MR angiography estimated at 70%. Venous sinuses: Patent and normal  Anatomic variants: None significant Delayed phase: No abnormal enhancement IMPRESSION: Atherosclerotic plaque affecting the right carotid bifurcation. 30% stenosis of the proximal ICA. No left carotid bifurcation stenosis. 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 Left vertebral artery widely patent in the neck. 50% stenosis of the left vertebral artery at the foramen magnum. 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 30% stenosis of the right carotid siphon. Anterior and middle cerebral vessels widely patent. CT the brain does not show any new finding. Electronically Signed   By: Nelson Chimes M.D.   On:  01/29/2016 19:41   Ct Head Wo Contrast  01/29/2016  CLINICAL DATA:  Followup code stroke.  New neurological symptoms. EXAM: CT ANGIOGRAPHY HEAD AND NECK TECHNIQUE: Multidetector CT imaging of the head and neck was performed using the standard protocol during bolus administration of intravenous contrast. Multiplanar CT image reconstructions and MIPs were obtained to evaluate the vascular anatomy. Carotid stenosis measurements (when applicable) are obtained utilizing NASCET criteria, using the distal internal carotid diameter as the denominator. CONTRAST:  50 cc Isovue 370 COMPARISON:  MR exams same day.  CT 01/28/2016.  MRI 10/11/2015. FINDINGS: CT HEAD No sign of acute infarction by CT. Acute infarction demonstrated in the pons by MRI is not resolved. No cerebellar insult. Old lacunar infarctions in the basal ganglia appear the same. Mild chronic small-vessel ischemic changes of the white matter appear the same. No hemorrhage, hydrocephalus or extra-axial collection. No calvarial abnormality. Sinuses are clear. CTA NECK Aortic arch: Aortic atherosclerosis. No aneurysm or dissection. Previous CABG. Right carotid system: Common carotid artery widely patent to the bifurcation. Atherosclerotic plaque affecting the carotid bifurcation. Minimal diameter in the ICA bulb measures 3.5 mm. Compared to a more distal cervical ICA diameter of 5 mm, this indicates a 30% stenosis. Left carotid system: Common carotid artery widely patent to the bifurcation. Mild atherosclerotic plaque at carotid bifurcation but no stenosis. 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 Left vertebral artery origin widely patent. Left vertebral is a large vessel widely patent through the cervical region. Skeleton: Ordinary mild cervical spondylosis. Other neck: No mass or lymphadenopathy.  Lung apices are clear. CTA HEAD Anterior circulation: Internal carotid arteries are widely patent through the skullbase and siphon  regions. Atherosclerotic calcification in the carotid siphon regions but no stenosis greater than 30%. Supraclinoid internal carotid artery is widely patent. The anterior and middle cerebral vessels are patent without proximal stenosis, aneurysm or vascular malformation. No missing branch vessels detected. Posterior circulation: No antegrade flow in the distal right vertebral artery. Left vertebral artery is widely patent to the foramen magnum. There is atherosclerotic disease of the left vertebral artery at the foramen magnum with stenosis of 50%. Beyond that, the vertebral is widely patent to the basilar. Retrograde flow in the right vertebral artery to PICA. Focal stenosis of the basilar artery as demonstrated at MR angiography estimated at 70%. Venous sinuses: Patent and normal Anatomic variants: None significant Delayed phase: No abnormal enhancement IMPRESSION: Atherosclerotic plaque affecting the right carotid bifurcation. 30% stenosis of the proximal ICA. No left carotid bifurcation stenosis. 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 Left vertebral artery widely patent in the neck. 50% stenosis of the left vertebral artery at the foramen magnum. 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 30% stenosis of the right carotid siphon. Anterior and middle cerebral vessels widely patent. CT the brain does not show any new finding. Electronically Signed   By: Nelson Chimes M.D.   On: 01/29/2016  19:41   Ct Head Wo Contrast  01/28/2016  CLINICAL DATA:  Slurred speech, lethargic for 3 days EXAM: CT HEAD WITHOUT CONTRAST TECHNIQUE: Contiguous axial images were obtained from the base of the skull through the vertex without intravenous contrast. COMPARISON:  MR brain 10/11/2015 FINDINGS: There is no evidence of mass effect, midline shift or extra-axial fluid collections. There is no evidence of a space-occupying lesion or intracranial hemorrhage. There is no evidence of a cortical-based area of acute infarction.  There is a small lacunar infarct along the roof of the left lateral ventricle. The ventricles and sulci are appropriate for the patient's age. The basal cisterns are patent. Visualized portions of the orbits are unremarkable. The visualized portions of the paranasal sinuses and mastoid air cells are unremarkable. The osseous structures are unremarkable. IMPRESSION: No acute intracranial pathology. Electronically Signed   By: Kathreen Devoid   On: 01/28/2016 20:31   Ct Angio Neck W Or Wo Contrast  01/29/2016  CLINICAL DATA:  Followup code stroke.  New neurological symptoms. EXAM: CT ANGIOGRAPHY HEAD AND NECK TECHNIQUE: Multidetector CT imaging of the head and neck was performed using the standard protocol during bolus administration of intravenous contrast. Multiplanar CT image reconstructions and MIPs were obtained to evaluate the vascular anatomy. Carotid stenosis measurements (when applicable) are obtained utilizing NASCET criteria, using the distal internal carotid diameter as the denominator. CONTRAST:  50 cc Isovue 370 COMPARISON:  MR exams same day.  CT 01/28/2016.  MRI 10/11/2015. FINDINGS: CT HEAD No sign of acute infarction by CT. Acute infarction demonstrated in the pons by MRI is not resolved. No cerebellar insult. Old lacunar infarctions in the basal ganglia appear the same. Mild chronic small-vessel ischemic changes of the white matter appear the same. No hemorrhage, hydrocephalus or extra-axial collection. No calvarial abnormality. Sinuses are clear. CTA NECK Aortic arch: Aortic atherosclerosis. No aneurysm or dissection. Previous CABG. Right carotid system: Common carotid artery widely patent to the bifurcation. Atherosclerotic plaque affecting the carotid bifurcation. Minimal diameter in the ICA bulb measures 3.5 mm. Compared to a more distal cervical ICA diameter of 5 mm, this indicates a 30% stenosis. Left carotid system: Common carotid artery widely patent to the bifurcation. Mild atherosclerotic  plaque at carotid bifurcation but no stenosis. 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 Left vertebral artery origin widely patent. Left vertebral is a large vessel widely patent through the cervical region. Skeleton: Ordinary mild cervical spondylosis. Other neck: No mass or lymphadenopathy.  Lung apices are clear. CTA HEAD Anterior circulation: Internal carotid arteries are widely patent through the skullbase and siphon regions. Atherosclerotic calcification in the carotid siphon regions but no stenosis greater than 30%. Supraclinoid internal carotid artery is widely patent. The anterior and middle cerebral vessels are patent without proximal stenosis, aneurysm or vascular malformation. No missing branch vessels detected. Posterior circulation: No antegrade flow in the distal right vertebral artery. Left vertebral artery is widely patent to the foramen magnum. There is atherosclerotic disease of the left vertebral artery at the foramen magnum with stenosis of 50%. Beyond that, the vertebral is widely patent to the basilar. Retrograde flow in the right vertebral artery to PICA. Focal stenosis of the basilar artery as demonstrated at MR angiography estimated at 70%. Venous sinuses: Patent and normal Anatomic variants: None significant Delayed phase: No abnormal enhancement IMPRESSION: Atherosclerotic plaque affecting the right carotid bifurcation. 30% stenosis of the proximal ICA. No left carotid bifurcation stenosis. 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 Left vertebral  artery widely patent in the neck. 50% stenosis of the left vertebral artery at the foramen magnum. 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 30% stenosis of the right carotid siphon. Anterior and middle cerebral vessels widely patent. CT the brain does not show any new finding. Electronically Signed   By: Nelson Chimes M.D.   On: 01/29/2016 19:41   Mr Brain Wo Contrast  01/29/2016  CLINICAL DATA:  Lower  extremity weakness and slurred speech beginning yesterday. History of stroke 5 weeks ago, with known RIGHT vertebral artery occlusion. Aphasia and ataxia. History of hypertension, hyperlipidemia, diabetes. EXAM: MRI HEAD WITHOUT CONTRAST TECHNIQUE: Multiplanar, multiecho pulse sequences of the brain and surrounding structures were obtained without intravenous contrast. COMPARISON:  CT HEAD January 28, 2016 and MRI of the head October 11, 2015 FINDINGS: INTRACRANIAL CONTENTS: 3 subcentimeter foci of reduced diffusion LEFT ventral pons with low ADC values. No susceptibility artifact to suggest hemorrhage. The ventricles and sulci are normal for patient's age. Scattered subcentimeter supratentorial white matter FLAIR T2 hyperintensities compatible with chronic small vessel ischemic disease, a few which demonstrate underlying cystic changes. RIGHT ventral pontine old infarcts. Old bilateral caudate head lacunar infarcts. No suspicious parenchymal signal, masses or mass effect. No abnormal extra-axial fluid collections. No extra-axial masses though, contrast enhanced sequences would be more sensitive. Loss of normal RIGHT vertebral artery flow void. Irregular signal central basilar artery was present previously suggesting thrombus or dissection . ORBITS: The included ocular globes and orbital contents are non-suspicious. SINUSES: The mastoid air-cells and included paranasal sinuses are well-aerated. SKULL/SOFT TISSUES: No abnormal sellar expansion. No suspicious calvarial bone marrow signal. Craniocervical junction maintained. IMPRESSION: Acute small LEFT pontine infarcts. Old RIGHT pontine infarcts. 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 Stable chronic changes including moderate chronic small vessel ischemic disease and old caudate lacunar infarcts. Electronically Signed   By: Elon Alas M.D.   On: 01/29/2016 01:11   [name] Head/brain Wo Cm  01/29/2016   CLINICAL DATA:  60 year old male with acute left brainstem infarct discovered on MRI for lower extremity weakness and slurred speech. Known right vertebral artery occlusion. Initial encounter. EXAM: MRA HEAD WITHOUT CONTRAST TECHNIQUE: Angiographic images of the Circle of Willis were obtained using MRA technique without intravenous contrast. COMPARISON:  Brain MRI 01/29/2016. Brain MRI and intracranial MRA 10/11/2015 FINDINGS: Continued absence of antegrade flow in the distal right vertebral artery. Preserved antegrade flow in the distal left vertebral artery, but increased left V4 irregularity and mild stenosis (series 3, image 119). This is just proximal to the left PICA origin which remains patent. The left vertebral supplies the basilar. Multifocal basilar artery irregularity again noted. Suspect progressed chronic stenosis at the distal 3rd basilar artery, moderate to severe (series 306, image 9). AICA origins appear to remain patent. SCA and PCA origins are stable and patent. Left PCA branches are stable. Suggestion of increased and moderate to severe stenosis in the right distal PCA superior division today. Antegrade flow in both ICA siphons is stable. Both ophthalmic artery origins remain patent. No siphon stenosis. Carotid termini are stable and patent. Mild irregularity at the left ICA terminus. The left ACA A1 segment is non dominant. MCA and ACA origins otherwise within normal limits. Anterior communicating artery and visualized bilateral ACA branches are stable and within normal limits. MCA M1 segments and bifurcations are stable with mild irregularity. Visualized bilateral MCA branches are stable, no major branch occlusion. IMPRESSION: 1. Progressed chronic posterior circulation atherosclerosis since March,  including evidence now of moderate to severe distal 3rd basilar artery stenosis. 2. Chronic distal right vertebral artery occlusion. Progressed mild atherosclerotic stenosis in the distal left  vertebral artery. 3. Stable anterior circulation with comparatively mild atherosclerosis. Electronically Signed   By: Genevie Ann M.D.   On: 01/29/2016 08:30        Scheduled Meds: . atorvastatin  80 mg Oral q1800  . fenofibrate  160 mg Oral Daily  . fentaNYL      . gabapentin  300 mg Oral QHS  . gabapentin  600 mg Oral Daily  . heparin      . insulin aspart  0-15 Units Subcutaneous TID WC  . insulin aspart  0-5 Units Subcutaneous QHS  . insulin detemir  75 Units Subcutaneous BID  . lidocaine      . lisinopril  20 mg Oral Daily  . metoprolol succinate  25 mg Oral Daily  . midazolam       Continuous Infusions: . heparin 1,450 Units/hr (01/30/16 0559)     LOS: 1 day    Time spent: 45 minutes.    [facility], MD [facility] Pager [phone]  If 7PM-7AM, please contact night-coverage [email or web address] Password [facility] 01/30/2016, 3:48 PM

## 2016-01-30 NOTE — Progress Notes (Signed)
SLP Cancellation Note  Patient Details Name: [name] MRN: [id number] DOB: 1956-06-30   Cancelled treatment:       Reason Eval/Treat Not Completed: Patient at procedure or test/unavailable. SLP aware of change in status and worsening dysphagia. Pt apparently will have a procedure today, so SLP will plan to f/u tomorrow unless procedure is completed early in the day. Please page if pt ready for swallow eval by 12pm . [name], MA CCC-SLP [phone]    Fabrizzio Marcella, Katherene Ponto 01/30/2016, 7:20 AM

## 2016-01-30 NOTE — Progress Notes (Signed)
Occupational Therapy Evaluation Patient Details Name: [name] MRN: [id number] DOB: April 14, 1956 Today's Date: 01/30/2016    History of Present Illness [name] is a 60 y.o. male with history of stroke in March 2017, CAD status post CABG, diabetes mellitus type 2, hypertriglyceridemia, peripheral vascular disease status post femoro/tibial bypass in February 2017. Pt was admitted to the hospital due to persistent difficulty with expressive aphasia and dizziness. MRI of the brain shows left pontine infarct.   Clinical Impression   PTA, pt independent with ADL and mobility, including driving. Pt presents with deficits listed below, including mild RUE coordination deficits, and currently requires min A for ADL and minguard A for mobility. Recommend pt follow up with OT at the neuro outpt center. Will follow up prior to D/C to establish a HEP and educate pt on use of AE for LB ADL.     Follow Up Recommendations  Outpatient OT;Supervision - Intermittent    Equipment Recommendations  3 in 1 bedside comode    Recommendations for Other Services       Precautions / Restrictions Precautions Precautions: Fall Restrictions Weight Bearing Restrictions: No      Mobility Bed Mobility Overal bed mobility: Needs Assistance Bed Mobility: Supine to Sit     Supine to sit: Min assist     General bed mobility comments: from flat  Transfers Overall transfer level: Needs assistance Equipment used: None Transfers: Sit to/from [facility] Sit to Stand: Supervision Stand pivot transfers: Supervision            Balance     Sitting balance-Leahy Scale: Good       Standing balance-Leahy Scale: Fair                              ADL Overall ADL's : Needs assistance/impaired Eating/Feeding: NPO   Grooming: Set up;Standing   Upper Body Bathing: Set up;Sitting   Lower Body Bathing: Minimal assistance;Sit to/from stand   Upper Body Dressing : Set  up;Sitting   Lower Body Dressing: Minimal assistance;Sit to/from stand   Toilet Transfer: Min guard;Ambulation   Toileting- Clothing Manipulation and Hygiene: Supervision/safety;Sit to/from stand       Functional mobility during ADLs: Min guard General ADL Comments: Pt has difficulty reaching his feet and would benefit from use of AE.     Vision Vision Assessment?: Yes;No apparent visual deficits Eye Alignment: Within Functional Limits Ocular Range of Motion: Within Functional Limits Alignment/Gaze Preference: Within Defined Limits Tracking/Visual Pursuits: Able to track stimulus in all quads without difficulty Saccades: Within functional limits Convergence: Within functional limits Visual Fields: No apparent deficits   Perception     Praxis Praxis Praxis tested?: Within functional limits    Pertinent Vitals/Pain Pain Assessment: No/denies pain     Hand Dominance Right   Extremity/Trunk Assessment Upper Extremity Assessment Upper Extremity Assessment: RUE deficits/detail RUE Deficits / Details: strength WFL. mild difficulty with coordination. Apparent ataxia RUE Coordination: decreased fine motor;decreased gross motor   Lower Extremity Assessment Lower Extremity Assessment: Defer to PT evaluation LLE Sensation:  (~60% from knee down )   Cervical / Trunk Assessment Cervical / Trunk Assessment: Normal   Communication Communication Communication: Expressive difficulties   Cognition Arousal/Alertness: Awake/alert Behavior During Therapy: WFL for tasks assessed/performed Overall Cognitive Status: Within Functional Limits for tasks assessed                     General  Comments       Exercises Exercises: Other exercises Other Exercises Other Exercises: BUE integrated tasks Other Exercises: gross motor coordination activities   Shoulder Instructions      Home Living Family/patient expects to be discharged to:: Private residence Living Arrangements:  Spouse/significant other Available Help at Discharge: Family Type of Home: House Home Access: Stairs to enter Technical brewer of Steps: 2 Entrance Stairs-Rails: Can reach both;Right;Left Home Layout: One level     Bathroom Shower/Tub: Tub/shower unit;Walk-in shower Shower/tub characteristics: Architectural technologist: Standard Bathroom Accessibility: Yes How Accessible: Accessible via walker;Accessible via wheelchair Home Equipment: Grab bars - tub/shower          Prior Functioning/Environment Level of Independence: Independent             OT Diagnosis: Generalized weakness;Ataxia   OT Problem List: Impaired balance (sitting and/or standing);Decreased coordination;Decreased knowledge of use of DME or AE;Obesity;Impaired sensation;Impaired UE functional use   OT Treatment/Interventions: Self-care/ADL training;Therapeutic exercise;Neuromuscular education;DME and/or AE instruction;Therapeutic activities;Patient/family education;Balance training    OT Goals(Current goals can be found in the care plan section) Acute Rehab OT Goals Patient Stated Goal: Get back to normal OT Goal Formulation: With patient Time For Goal Achievement: 02/13/16 Potential to Achieve Goals: Good ADL Goals Pt Will Perform Lower Body Bathing: with set-up;with supervision;with caregiver independent in assisting;sit to/from stand;with adaptive equipment Pt Will Perform Lower Body Dressing: with set-up;with supervision;with adaptive equipment;with caregiver independent in assisting;sit to/from stand Pt Will Transfer to Toilet: with modified independence;ambulating;bedside commode Pt/caregiver will Perform Home Exercise Program: Right Upper extremity;With Supervision;With written HEP provided (fine/gross motor/coordinaiton/ BUE integrated tasks)  OT Frequency: Min 2X/week   Barriers to D/C:            Co-evaluation              End of Session Equipment Utilized During Treatment: Gait  belt Nurse Communication: Mobility status  Activity Tolerance: Patient tolerated treatment well Patient left: in chair;with call bell/phone within reach;with family/visitor present   Time: [id number] OT Time Calculation (min): 27 min Charges:  OT General Charges $OT Visit: 1 Procedure OT Evaluation $OT Eval Moderate Complexity: 1 Procedure OT Treatments $Self Care/Home Management : 8-22 mins G-Codes:    Alexandar Weisenberger,HILLARY 02/25/16, 9:26 AM   Maurie Boettcher, OTR/L  [phone] 02/25/16

## 2016-01-31 ENCOUNTER — Inpatient Hospital Stay (HOSPITAL_COMMUNITY): Payer: BLUE CROSS/BLUE SHIELD

## 2016-01-31 DIAGNOSIS — I6789 Other cerebrovascular disease: Secondary | ICD-10-CM

## 2016-01-31 DIAGNOSIS — I739 Peripheral vascular disease, unspecified: Secondary | ICD-10-CM

## 2016-01-31 DIAGNOSIS — I639 Cerebral infarction, unspecified: Principal | ICD-10-CM

## 2016-01-31 LAB — CBC
HEMATOCRIT: 39.9 % (ref 39.0–52.0)
HEMOGLOBIN: 13.2 g/dL (ref 13.0–17.0)
MCH: 28.7 pg (ref 26.0–34.0)
MCHC: 33.1 g/dL (ref 30.0–36.0)
MCV: 86.7 fL (ref 78.0–100.0)
Platelets: 308 10*3/uL (ref 150–400)
RBC: 4.6 MIL/uL (ref 4.22–5.81)
RDW: 13.6 % (ref 11.5–15.5)
WBC: 8.8 10*3/uL (ref 4.0–10.5)

## 2016-01-31 LAB — HEPARIN LEVEL (UNFRACTIONATED): Heparin Unfractionated: 0.18 IU/mL — ABNORMAL LOW (ref 0.30–0.70)

## 2016-01-31 LAB — GLUCOSE, CAPILLARY
GLUCOSE-CAPILLARY: 115 mg/dL — AB (ref 65–99)
GLUCOSE-CAPILLARY: 225 mg/dL — AB (ref 65–99)
GLUCOSE-CAPILLARY: 153 mg/dL — AB (ref 65–99)
Glucose-Capillary: 251 mg/dL — ABNORMAL HIGH (ref 65–99)

## 2016-01-31 LAB — ECHOCARDIOGRAM COMPLETE
Height: 71 in
Weight: 3840 oz

## 2016-01-31 MED ORDER — PERFLUTREN LIPID MICROSPHERE
1.0000 mL | INTRAVENOUS | Status: AC | PRN
Start: 2016-01-31 — End: 2016-01-31
  Administered 2016-01-31: 4 mL via INTRAVENOUS
  Filled 2016-01-31: qty 10

## 2016-01-31 MED ORDER — ASPIRIN 81 MG PO CHEW
81.0000 mg | CHEWABLE_TABLET | Freq: Every day | ORAL | Status: DC
  Administered 2016-01-31: 81 mg via ORAL
  Filled 2016-01-31: qty 1

## 2016-01-31 MED ORDER — INSULIN ASPART 100 UNIT/ML ~~LOC~~ SOLN
20.0000 [IU] | Freq: Three times a day (TID) | SUBCUTANEOUS | Status: DC
Start: 2016-01-31 — End: 2016-01-31
  Administered 2016-01-31 (×3): 20 [IU] via SUBCUTANEOUS

## 2016-01-31 MED ORDER — METFORMIN HCL ER 500 MG PO TB24: 500.0000 mg | ORAL_TABLET | Freq: Two times a day (BID) | ORAL | Status: DC

## 2016-01-31 MED ORDER — CLOPIDOGREL BISULFATE 75 MG PO TABS
75.0000 mg | ORAL_TABLET | Freq: Every day | ORAL | Status: DC
Start: 2016-01-31 — End: 2016-01-31
  Administered 2016-01-31: 75 mg via ORAL
  Filled 2016-01-31: qty 1

## 2016-01-31 MED ORDER — HEPARIN (PORCINE) IN NACL 100-0.45 UNIT/ML-% IJ SOLN
2000.0000 [IU]/h | INTRAMUSCULAR | Status: DC
  Administered 2016-01-31: 2000 [IU]/h via INTRAVENOUS
  Filled 2016-01-31 (×2): qty 250

## 2016-01-31 MED ORDER — INSULIN DETEMIR 100 UNIT/ML ~~LOC~~ SOLN
65.0000 [IU] | Freq: Two times a day (BID) | SUBCUTANEOUS | Status: DC
  Administered 2016-01-31: 65 [IU] via SUBCUTANEOUS
  Filled 2016-01-31 (×2): qty 0.65

## 2016-01-31 MED ORDER — PERFLUTREN LIPID MICROSPHERE
INTRAVENOUS | Status: AC
  Filled 2016-01-31: qty 10

## 2016-01-31 MED ORDER — ASPIRIN EC 81 MG PO TBEC: 81.0000 mg | DELAYED_RELEASE_TABLET | Freq: Every day | ORAL | Status: DC

## 2016-01-31 NOTE — Discharge Summary (Signed)
Physician Discharge Summary  [name] [id number] DOB: [birth year]  PCP: No PCP Per Patient  Admit date: 01/28/2016 Discharge date: 01/31/2016  Admitted From: Home  Disposition:  Home  Recommendations for Outpatient Follow-up:  1. Dr. Luanne Bras, IR for procedure on 02/04/16. 2. Dr. Jacelyn Pi, Endocrinology in 2 weeks- re management of DM. 3. Dr. Quay Burow, Cardiology in 1 month.  Home Health: Outpatient PT and OT. Equipment/Devices: None    Discharge Condition: Improved and stable  CODE STATUS: Full  Diet recommendation: Heart Healthy & Diabetic diet.  Discharge Diagnoses:  Principal Problem:   Left pontine stroke [facility]) Active Problems:   PVD (peripheral vascular disease) ([place])   Essential hypertension   Coronary artery disease, history of CABG   Hypertriglyceridemia   Uncontrolled diabetes mellitus with hyperglycemia, with long-term current use of insulin (HCC)   Stroke (cerebrum) (H[facility])   Occlusion and stenosis of basilar artery   Acute CVA (cerebrovascular accident) ([place])   Paroxysmal atrial fibrillation ([place])   Left pontine CVA ([place])   Brief/Interim Summary: 60 y.o. male with history of stroke in March 2017, CAD status post CABG, DM type 2, HLD, PAD status post femoro/tibial bypass in February 2017, HTN presents to the ER because of persistent difficulty with expressive aphasia and dizziness. Patient's symptoms started on 01/27/2016 when he woke up in the morning. CT of the head did not show anything acute. MRI of the brain shows left pontine infarct. Neurology consulted. Cardiology consulted for evaluation of new onset A. fib.  Acute left brain stroke: Left pontine infarct in the setting of recent right pontine infarct with progressive severe basilar artery stenosis - Resultant dysarthria and facial asymmetry - CT head 7/11: No acute intracranial pathology. - MRI brain 01/29/16: Acute small left pontine infarct. Old right pontine infarcts. 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 Repeat 2-D echo this admission: LVEF 45-50%. - Carotid Dopplers: 10/11/15: 1-39 percent stenosis involving the right internal carotid artery and the left internal carotid artery. - CT Brain 01/29/16: No new finding. - CTA head 01/29/16:30% stenosis of the proximal ICA. 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 Left vertebral artery widely patent in the neck. 50% stenosis of the left vertebral artery at the foramen magnum. 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 30% stenosis of the right carotid siphon. - LDL: Unable to calculate due to hypertriglyceridemia - A1c: 10.3 on 10/11/15. Repeat hemoglobin A1c 7/12:11.5 - Patient was on aspirin 325 MG daily and Plavix 75 MG daily prior to admission, was continued on these on admission until evening of 7/12 when he developed worsening of neurological symptoms. Repeat CTA head was performed following which neurology placed him on IV heparin drip pending cerebral angiography 7/13.  - Cardiology consulted and recommend starting Eliquis once invasive evaluation is completed at which time antiplatelet therapy to be discontinued. - Etiology: ? Embolic. - PT, OT and ST evaluation: Outpatient PT.  - Stroke team follow up noted: Patient has symptomatic severe basilar stenosis as well as new onset atrial fibrillation which puts him at significant risk of recurrent stroke and TIAs. He has failed medical therapy and may be considered for endovascular treatment with basilar angioplasty/stenting but he is at high risk for bleeding  as he likely will need to stay on long-term anticoagulation plus  antiplatelet therapy if he gets a stent. This was discussed in detail by neurology with patient verbalized understanding.. Discussed with Dr. Leonie Man, Neurology who recommended discharging home on aspirin and Plavix and return for elective basilar stenting on 02/04/16 following which anticoagulation with Eliquis + Plavix continue and DC Aspirin.  Paroxysmal A. Fib - New diagnosis - Cardiology consultation and follow-up appreciated. Management of antiplatelets and anticoagulation therapy as indicated above. Outpatient follow-up with cardiology. Discussed with Dr. Tamala Julian. - This was not felt to be the cause of recurrent posterior circulation infarcts.  Type II DM, uncontrolled with peripheral neuropathy - A1c: 10.3 on 10/11/15. Follow repeat A1c: 11.5 suggesting poor outpatient control. Patient on high doses of Levemir and aspart insulin. SSI added while hospitalized.  - Advised close outpatient follow-up with endocrinology for better control of his diabetes and he verbalizes understanding.  Essential hypertension - Allowed for permissive hypertension given recent stroke. Resume home antihypertensives at discharge.  Dyslipidemia - Cholesterol 523, triglycerides 1925, unable to calculate HDL, LDL or VLDL. - Continue atorvastatin 80 mg daily and fenofibrate 160 daily. May need further titration. Outpatient follow-up with endocrinology.-   CAD status post CABG - Asymptomatic of chest pain  PAD status post left lower extremity bypass February 2017 - No acute issues. Anticoagulation as discussed above.  Obesity/Body mass index is 33.49 kg/(m^2). - Recommended diet, exercise and weight loss.   Discussed with patient's friend at bedside. Updated care and answered questions.   Consultants:   Neurology  Cardiology  IR  Procedures:   2-D echo 01/31/16: Study Conclusions  - Procedure narrative: Transthoracic echocardiography. Image  quality was suboptimal. The study was technically  difficult, as a  result of poor acoustic windows, poor sound wave transmission,  and body habitus. Intravenous contrast (Definity) was  administered. - Left ventricle: The cavity size was normal. Wall thickness was  increased in a pattern of mild LVH. Systolic function was mildly  reduced. The estimated ejection fraction was in the range of 45%  to 50%.   Cerebral angiogram 01/30/16: FINDINGS. 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 2.Mild to moderate stenosis of RT ICA supraclinoid seg. 3.Occluded RT VA , with no distal reconstituition. 4 NO PCOMS bilaterally         Discharge Instructions      Discharge Instructions    Ambulatory referral to Occupational Therapy    Complete by:  As directed      Ambulatory referral to Physical Therapy    Complete by:  As directed      Call MD for:    Complete by:  As directed   Strokelike symptoms.     Diet - low sodium heart healthy    Complete by:  As directed      Diet Carb Modified    Complete by:  As directed      Increase activity slowly    Complete by:  As directed             Medication List    STOP taking these medications        aspirin 325 MG tablet  Replaced by:  aspirin EC 81 MG tablet      TAKE these medications        aspirin EC 81 MG tablet  Take 1 tablet (81 mg total) by mouth daily.     atorvastatin 80 MG tablet  Commonly  known as:  LIPITOR  Take 1 tablet (80 mg total) by mouth daily.     clopidogrel 75 MG tablet  Commonly known as:  PLAVIX  TAKE 1 TABLET BY MOUTH EVERY DAY     fenofibrate 145 MG tablet  Commonly known as:  TRICOR  Take 1 tablet (145 mg total) by mouth daily.     gabapentin 300 MG capsule  Commonly known as:  NEURONTIN  Take 300-600 mg by mouth 2 (two) times daily. Take 600mg  in the morning and 300mg  in the evening.     insulin aspart 100 UNIT/ML injection  Commonly known as:  novoLOG  Inject 40 Units into the skin 3 (three) times  daily before meals.     LEVEMIR FLEXTOUCH 100 UNIT/ML Pen  Generic drug:  Insulin Detemir  Inject 80 Units into the skin 2 (two) times daily. Reported on 08/21/2015     lisinopril-hydrochlorothiazide 20-12.5 MG tablet  Commonly known as:  PRINZIDE,ZESTORETIC  Take 1 tablet by mouth daily with breakfast.     metFORMIN 500 MG 24 hr tablet  Commonly known as:  GLUCOPHAGE-XR  Take 1 tablet (500 mg total) by mouth 2 (two) times daily.  Start taking on:  02/01/2016     metoprolol succinate 25 MG 24 hr tablet  Commonly known as:  TOPROL-XL  TAKE 1 TABLET (25 MG TOTAL) BY MOUTH DAILY.       Follow-up Information    Follow up with [facility].   Specialty:  Rehabilitation   Why:  they will contact you to set up the appointment.   Contact information:   [place] [id number] [place] [phone]      Follow up with Rob Hickman, MD On 02/04/2016.   Specialty:  Interventional Radiology   Why:  Keep your appointment for vascular procedure.   Contact information:   [address] [place] [address] [phone]       Follow up with Jacelyn Pi, MD. Schedule an appointment as soon as possible for a visit in 2 weeks.   Specialty:  Endocrinology   Why:  Follow-up regarding better control of your diabetes.   Contact information:   [address] [place] [address] [phone]       Follow up with Quay Burow, MD. Schedule an appointment as soon as possible for a visit in 1 month.   Specialties:  Cardiology, Radiology   Contact information:   [address] [place] [address] [phone]      No Known Allergies   Procedures/Studies: Ct Angio Head W Or Wo Contrast  01/29/2016  CLINICAL DATA:  Followup code stroke.  New neurological symptoms. EXAM: CT ANGIOGRAPHY HEAD AND NECK TECHNIQUE: Multidetector CT imaging of the head and  neck was performed using the standard protocol during bolus administration of intravenous contrast. Multiplanar CT image reconstructions and MIPs were obtained to evaluate the vascular anatomy. Carotid stenosis measurements (when applicable) are obtained utilizing NASCET criteria, using the distal internal carotid diameter as the denominator. CONTRAST:  50 cc Isovue 370 COMPARISON:  MR exams same day.  CT 01/28/2016.  MRI 10/11/2015. FINDINGS: CT HEAD No sign of acute infarction by CT. Acute infarction demonstrated in the pons by MRI is not resolved. No cerebellar insult. Old lacunar infarctions in the basal ganglia appear the same. Mild chronic small-vessel ischemic changes of the white matter appear the same. No hemorrhage, hydrocephalus or  extra-axial collection. No calvarial abnormality. Sinuses are clear. CTA NECK Aortic arch: Aortic atherosclerosis. No aneurysm or dissection. Previous CABG. Right carotid system: Common carotid artery widely patent to the bifurcation. Atherosclerotic plaque affecting the carotid bifurcation. Minimal diameter in the ICA bulb measures 3.5 mm. Compared to a more distal cervical ICA diameter of 5 mm, this indicates a 30% stenosis. Left carotid system: Common carotid artery widely patent to the bifurcation. Mild atherosclerotic plaque at carotid bifurcation but no stenosis. 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 Left vertebral artery origin widely patent. Left vertebral is a large vessel widely patent through the cervical region. Skeleton: Ordinary mild cervical spondylosis. Other neck: No mass or lymphadenopathy.  Lung apices are clear. CTA HEAD Anterior circulation: Internal carotid arteries are widely patent through the skullbase and siphon regions. Atherosclerotic calcification in the carotid siphon regions but no stenosis greater than 30%. Supraclinoid internal carotid artery is widely patent. The anterior and middle cerebral vessels are patent  without proximal stenosis, aneurysm or vascular malformation. No missing branch vessels detected. Posterior circulation: No antegrade flow in the distal right vertebral artery. Left vertebral artery is widely patent to the foramen magnum. There is atherosclerotic disease of the left vertebral artery at the foramen magnum with stenosis of 50%. Beyond that, the vertebral is widely patent to the basilar. Retrograde flow in the right vertebral artery to PICA. Focal stenosis of the basilar artery as demonstrated at MR angiography estimated at 70%. Venous sinuses: Patent and normal Anatomic variants: None significant Delayed phase: No abnormal enhancement IMPRESSION: Atherosclerotic plaque affecting the right carotid bifurcation. 30% stenosis of the proximal ICA. No left carotid bifurcation stenosis. 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 Left vertebral artery widely patent in the neck. 50% stenosis of the left vertebral artery at the foramen magnum. 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 30% stenosis of the right carotid siphon. Anterior and middle cerebral vessels widely patent. CT the brain does not show any new finding. Electronically Signed   By: Nelson Chimes M.D.   On: 01/29/2016 19:41   Ct Head Wo Contrast  01/29/2016  CLINICAL DATA:  Followup code stroke.  New neurological symptoms. EXAM: CT ANGIOGRAPHY HEAD AND NECK TECHNIQUE: Multidetector CT imaging of the head and neck was performed using the standard protocol during bolus administration of intravenous contrast. Multiplanar CT image reconstructions and MIPs were obtained to evaluate the vascular anatomy. Carotid stenosis measurements (when applicable) are obtained utilizing NASCET criteria, using the distal internal carotid diameter as the denominator. CONTRAST:  50 cc Isovue 370 COMPARISON:  MR exams same day.  CT 01/28/2016.  MRI 10/11/2015. FINDINGS: CT HEAD No sign of acute infarction by CT. Acute infarction demonstrated in the pons by MRI  is not resolved. No cerebellar insult. Old lacunar infarctions in the basal ganglia appear the same. Mild chronic small-vessel ischemic changes of the white matter appear the same. No hemorrhage, hydrocephalus or extra-axial collection. No calvarial abnormality. Sinuses are clear. CTA NECK Aortic arch: Aortic atherosclerosis. No aneurysm or dissection. Previous CABG. Right carotid system: Common carotid artery widely patent to the bifurcation. Atherosclerotic plaque affecting the carotid bifurcation. Minimal diameter in the ICA bulb measures 3.5 mm. Compared to a more distal cervical ICA diameter of 5 mm, this indicates a 30% stenosis. Left carotid system: Common carotid artery widely patent to the bifurcation. Mild atherosclerotic plaque at carotid bifurcation but no stenosis. 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 Left vertebral artery origin widely patent.  Left vertebral is a large vessel widely patent through the cervical region. Skeleton: Ordinary mild cervical spondylosis. Other neck: No mass or lymphadenopathy.  Lung apices are clear. CTA HEAD Anterior circulation: Internal carotid arteries are widely patent through the skullbase and siphon regions. Atherosclerotic calcification in the carotid siphon regions but no stenosis greater than 30%. Supraclinoid internal carotid artery is widely patent. The anterior and middle cerebral vessels are patent without proximal stenosis, aneurysm or vascular malformation. No missing branch vessels detected. Posterior circulation: No antegrade flow in the distal right vertebral artery. Left vertebral artery is widely patent to the foramen magnum. There is atherosclerotic disease of the left vertebral artery at the foramen magnum with stenosis of 50%. Beyond that, the vertebral is widely patent to the basilar. Retrograde flow in the right vertebral artery to PICA. Focal stenosis of the basilar artery as demonstrated at MR angiography estimated at  70%. Venous sinuses: Patent and normal Anatomic variants: None significant Delayed phase: No abnormal enhancement IMPRESSION: Atherosclerotic plaque affecting the right carotid bifurcation. 30% stenosis of the proximal ICA. No left carotid bifurcation stenosis. 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 Left vertebral artery widely patent in the neck. 50% stenosis of the left vertebral artery at the foramen magnum. 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 30% stenosis of the right carotid siphon. Anterior and middle cerebral vessels widely patent. CT the brain does not show any new finding. Electronically Signed   By: Nelson Chimes M.D.   On: 01/29/2016 19:41   Ct Head Wo Contrast  01/28/2016  CLINICAL DATA:  Slurred speech, lethargic for 3 days EXAM: CT HEAD WITHOUT CONTRAST TECHNIQUE: Contiguous axial images were obtained from the base of the skull through the vertex without intravenous contrast. COMPARISON:  MR brain 10/11/2015 FINDINGS: There is no evidence of mass effect, midline shift or extra-axial fluid collections. There is no evidence of a space-occupying lesion or intracranial hemorrhage. There is no evidence of a cortical-based area of acute infarction. There is a small lacunar infarct along the roof of the left lateral ventricle. The ventricles and sulci are appropriate for the patient's age. The basal cisterns are patent. Visualized portions of the orbits are unremarkable. The visualized portions of the paranasal sinuses and mastoid air cells are unremarkable. The osseous structures are unremarkable. IMPRESSION: No acute intracranial pathology. Electronically Signed   By: Kathreen Devoid   On: 01/28/2016 20:31   Ct Angio Neck W Or Wo Contrast  01/29/2016  CLINICAL DATA:  Followup code stroke.  New neurological symptoms. EXAM: CT ANGIOGRAPHY HEAD AND NECK TECHNIQUE: Multidetector CT imaging of the head and neck was performed using the standard protocol during bolus administration of intravenous  contrast. Multiplanar CT image reconstructions and MIPs were obtained to evaluate the vascular anatomy. Carotid stenosis measurements (when applicable) are obtained utilizing NASCET criteria, using the distal internal carotid diameter as the denominator. CONTRAST:  50 cc Isovue 370 COMPARISON:  MR exams same day.  CT 01/28/2016.  MRI 10/11/2015. FINDINGS: CT HEAD No sign of acute infarction by CT. Acute infarction demonstrated in the pons by MRI is not resolved. No cerebellar insult. Old lacunar infarctions in the basal ganglia appear the same. Mild chronic small-vessel ischemic changes of the white matter appear the same. No hemorrhage, hydrocephalus or extra-axial collection. No calvarial abnormality. Sinuses are clear. CTA NECK Aortic arch: Aortic atherosclerosis. No aneurysm or dissection. Previous CABG. Right carotid system: Common carotid artery widely patent to the bifurcation. Atherosclerotic plaque affecting the carotid bifurcation.  Minimal diameter in the ICA bulb measures 3.5 mm. Compared to a more distal cervical ICA diameter of 5 mm, this indicates a 30% stenosis. Left carotid system: Common carotid artery widely patent to the bifurcation. Mild atherosclerotic plaque at carotid bifurcation but no stenosis. 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 Left vertebral artery origin widely patent. Left vertebral is a large vessel widely patent through the cervical region. Skeleton: Ordinary mild cervical spondylosis. Other neck: No mass or lymphadenopathy.  Lung apices are clear. CTA HEAD Anterior circulation: Internal carotid arteries are widely patent through the skullbase and siphon regions. Atherosclerotic calcification in the carotid siphon regions but no stenosis greater than 30%. Supraclinoid internal carotid artery is widely patent. The anterior and middle cerebral vessels are patent without proximal stenosis, aneurysm or vascular malformation. No missing branch vessels  detected. Posterior circulation: No antegrade flow in the distal right vertebral artery. Left vertebral artery is widely patent to the foramen magnum. There is atherosclerotic disease of the left vertebral artery at the foramen magnum with stenosis of 50%. Beyond that, the vertebral is widely patent to the basilar. Retrograde flow in the right vertebral artery to PICA. Focal stenosis of the basilar artery as demonstrated at MR angiography estimated at 70%. Venous sinuses: Patent and normal Anatomic variants: None significant Delayed phase: No abnormal enhancement IMPRESSION: Atherosclerotic plaque affecting the right carotid bifurcation. 30% stenosis of the proximal ICA. No left carotid bifurcation stenosis. 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 Left vertebral artery widely patent in the neck. 50% stenosis of the left vertebral artery at the foramen magnum. 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 30% stenosis of the right carotid siphon. Anterior and middle cerebral vessels widely patent. CT the brain does not show any new finding. Electronically Signed   By: Nelson Chimes M.D.   On: 01/29/2016 19:41   Mr Brain Wo Contrast  01/29/2016  CLINICAL DATA:  Lower extremity weakness and slurred speech beginning yesterday. History of stroke 5 weeks ago, with known RIGHT vertebral artery occlusion. Aphasia and ataxia. History of hypertension, hyperlipidemia, diabetes. EXAM: MRI HEAD WITHOUT CONTRAST TECHNIQUE: Multiplanar, multiecho pulse sequences of the brain and surrounding structures were obtained without intravenous contrast. COMPARISON:  CT HEAD January 28, 2016 and MRI of the head October 11, 2015 FINDINGS: INTRACRANIAL CONTENTS: 3 subcentimeter foci of reduced diffusion LEFT ventral pons with low ADC values. No susceptibility artifact to suggest hemorrhage. The ventricles and sulci are normal for patient's age. Scattered subcentimeter supratentorial white matter FLAIR T2 hyperintensities compatible with  chronic small vessel ischemic disease, a few which demonstrate underlying cystic changes. RIGHT ventral pontine old infarcts. Old bilateral caudate head lacunar infarcts. No suspicious parenchymal signal, masses or mass effect. No abnormal extra-axial fluid collections. No extra-axial masses though, contrast enhanced sequences would be more sensitive. Loss of normal RIGHT vertebral artery flow void. Irregular signal central basilar artery was present previously suggesting thrombus or dissection . ORBITS: The included ocular globes and orbital contents are non-suspicious. SINUSES: The mastoid air-cells and included paranasal sinuses are well-aerated. SKULL/SOFT TISSUES: No abnormal sellar expansion. No suspicious calvarial bone marrow signal. Craniocervical junction maintained. IMPRESSION: Acute small LEFT pontine infarcts. Old RIGHT pontine infarcts. 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 Stable chronic changes including moderate chronic small vessel ischemic disease and old caudate lacunar infarcts. Electronically Signed   By: Elon Alas M.D.   On: 01/29/2016 01:11   [name] Head/brain Wo Cm  01/29/2016  CLINICAL DATA:  60 year old male with acute left brainstem infarct discovered on MRI for lower extremity weakness and slurred speech. Known right vertebral artery occlusion. Initial encounter. EXAM: MRA HEAD WITHOUT CONTRAST TECHNIQUE: Angiographic images of the Circle of Willis were obtained using MRA technique without intravenous contrast. COMPARISON:  Brain MRI 01/29/2016. Brain MRI and intracranial MRA 10/11/2015 FINDINGS: Continued absence of antegrade flow in the distal right vertebral artery. Preserved antegrade flow in the distal left vertebral artery, but increased left V4 irregularity and mild stenosis (series 3, image 119). This is just proximal to the left PICA origin which remains patent. The left vertebral supplies the basilar.  Multifocal basilar artery irregularity again noted. Suspect progressed chronic stenosis at the distal 3rd basilar artery, moderate to severe (series 306, image 9). AICA origins appear to remain patent. SCA and PCA origins are stable and patent. Left PCA branches are stable. Suggestion of increased and moderate to severe stenosis in the right distal PCA superior division today. Antegrade flow in both ICA siphons is stable. Both ophthalmic artery origins remain patent. No siphon stenosis. Carotid termini are stable and patent. Mild irregularity at the left ICA terminus. The left ACA A1 segment is non dominant. MCA and ACA origins otherwise within normal limits. Anterior communicating artery and visualized bilateral ACA branches are stable and within normal limits. MCA M1 segments and bifurcations are stable with mild irregularity. Visualized bilateral MCA branches are stable, no major branch occlusion. IMPRESSION: 1. 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 2. Chronic distal right vertebral artery occlusion. Progressed mild atherosclerotic stenosis in the distal left vertebral artery. 3. Stable anterior circulation with comparatively mild atherosclerosis. Electronically Signed   By: Genevie Ann M.D.   On: 01/29/2016 08:30      Subjective: Speech still slightly slurred but better than on admission. Denies any other complaints.   Discharge Exam:  Filed Vitals:   01/31/16 0139 01/31/16 0649 01/31/16 1000 01/31/16 1400  BP: 134/58 133/75 144/75 138/72  Pulse: 71 76 79 80  Temp: 98.4 F (36.9 C) 98.4 F (36.9 C) 98 F (36.7 C) 98.2 F (36.8 C)  TempSrc: Oral Oral Oral Oral  Resp: 17 18 16 17   Height:      Weight:      SpO2: 96% 98% 98% 99%    General exam: Pleasant middle-aged male sitting up comfortably in bed this morning.Friend at bedside.  Respiratory system: Clear to auscultation.  Respiratory effort normal. Cardiovascular system: S1 & S2 heard, RRR. No JVD, murmurs, rubs, gallops or clicks. No pedal edema. Telemetry: sinus rhythm. Gastrointestinal system: Abdomen is nondistended, soft and nontender. No organomegaly or masses felt. Normal bowel sounds heard. Central nervous system: Alert and oriented. Dysarthria +. Mild right lower facial weakness (diminished right nasolabial fold).  Extremities: Symmetric 5 x 5 power. No pronator drift. Surgical scar left mid thigh. Skin: No rashes, lesions or ulcers Psychiatry: Judgement and insight appear normal. Mood & affect appropriate.     The results of significant diagnostics from this hospitalization (including imaging, microbiology, ancillary and laboratory) are listed below for reference.     Microbiology: No results found for this or any previous visit (from the past 240 hour(s)).   Labs: BNP (last 3 results) No results for input(s): BNP in the last 8760 hours. Basic Metabolic Panel:  Recent Labs Lab 01/28/16 1951 01/29/16 0540 01/30/16 0425  NA 133* 136 135  K 3.9 4.4 4.1  CL 100* 102 102  CO2 23 25 26   GLUCOSE 390* 357* 251*  BUN 16 17 19   CREATININE 0.95 0.95 0.88  CALCIUM 9.8 9.4 9.3   Liver Function Tests:  Recent Labs Lab 01/29/16 0540  AST 17  ALT 19  ALKPHOS 74  BILITOT 0.7  PROT 6.5  ALBUMIN 3.4*   No results for input(s): LIPASE, AMYLASE in the last 168 hours. No results for input(s): AMMONIA in the last 168 hours. CBC:  Recent Labs Lab 01/28/16 1951 01/29/16 0540 01/30/16 0425 01/31/16 1603  WBC 10.2 9.4 10.4 8.8  HGB 13.4 12.9* 12.6* 13.2  HCT 40.4 39.9 38.8* 39.9  MCV 86.0 86.4 86.6 86.7  PLT 310 271 289 308   Cardiac Enzymes:  Recent Labs Lab 01/29/16 1516  TROPONINI <0.03   BNP: Invalid input(s): POCBNP CBG:  Recent Labs Lab 01/30/16 1558 01/30/16 2217 01/31/16 0648 01/31/16 1151 01/31/16 1701  GLUCAP 115* 270* 251* 225* 153*   D-Dimer No results for  input(s): DDIMER in the last 72 hours. Hgb A1c  Recent Labs  01/29/16 0538  HGBA1C 11.5*   Lipid Profile  Recent Labs  01/29/16 0538  CHOL 523*  HDL NOT REPORTED DUE TO HIGH TRIGLYCERIDES  LDLCALC UNABLE TO CALCULATE IF TRIGLYCERIDE OVER 400 mg/dL  TRIG 1925*  CHOLHDL NOT REPORTED DUE TO HIGH TRIGLYCERIDES   Thyroid function studies  Recent Labs  01/29/16 1516  TSH 2.497   Anemia work up No results for input(s): VITAMINB12, FOLATE, FERRITIN, TIBC, IRON, RETICCTPCT in the last 72 hours. Urinalysis    Component Value Date/Time   COLORURINE YELLOW 01/28/2016 1940   APPEARANCEUR CLEAR 01/28/2016 1940   LABSPEC 1.041* 01/28/2016 1940   PHURINE 5.5 01/28/2016 1940   GLUCOSEU >1000* 01/28/2016 1940   HGBUR NEGATIVE 01/28/2016 1940   BILIRUBINUR NEGATIVE 01/28/2016 1940   KETONESUR NEGATIVE 01/28/2016 1940   PROTEINUR NEGATIVE 01/28/2016 1940   UROBILINOGEN 0.2 01/05/2011 0918   NITRITE NEGATIVE 01/28/2016 1940   LEUKOCYTESUR NEGATIVE 01/28/2016 1940   Sepsis Labs Invalid input(s): PROCALCITONIN,  WBC,  LACTICIDVEN    Time coordinating discharge: Over 30 minutes  SIGNED:  Vernell Leep, MD, FACP, [place]. [facility] Pager [phone]  If 7PM-7AM, please contact night-coverage [email or web address] Password [facility] 01/31/2016, 5:48 PM

## 2016-01-31 NOTE — Discharge Instructions (Signed)
Atrial Fibrillation °Atrial fibrillation is a type of irregular or rapid heartbeat (arrhythmia). In atrial fibrillation, the heart quivers continuously in a chaotic pattern. This occurs when parts of the heart receive disorganized signals that make the heart unable to pump blood normally. This can increase the risk for stroke, heart failure, and other heart-related conditions. There are different types of atrial fibrillation, including: °· Paroxysmal atrial fibrillation. This type starts suddenly, and it usually stops on its own shortly after it starts. °· Persistent atrial fibrillation. This type often lasts longer than a week. It may stop on its own or with treatment. °· Long-lasting persistent atrial fibrillation. This type lasts longer than 12 months. °· Permanent atrial fibrillation. This type does not go away. °Talk with your health care provider to learn about the type of atrial fibrillation that you have. °CAUSES °This condition is caused by some heart-related conditions or procedures, including: °· A heart attack. °· Coronary artery disease. °· Heart failure. °· Heart valve conditions. °· High blood pressure. °· Inflammation of the sac that surrounds the heart (pericarditis). °· Heart surgery. °· Certain heart rhythm disorders, such as Wolf-Parkinson-White syndrome. °Other causes include: °· Pneumonia. °· Obstructive sleep apnea. °· Blockage of an artery in the lungs (pulmonary embolism, or PE). °· Lung cancer. °· Chronic lung disease. °· Thyroid problems, especially if the thyroid is overactive (hyperthyroidism). °· Caffeine. °· Excessive alcohol use or illegal drug use. °· Use of some medicines, including certain decongestants and diet pills. °Sometimes, the cause cannot be found. °RISK FACTORS °This condition is more likely to develop in: °· People who are older in age. °· People who smoke. °· People who have diabetes mellitus. °· People who are overweight (obese). °· Athletes who exercise  vigorously. °SYMPTOMS °Symptoms of this condition include: °· A feeling that your heart is beating rapidly or irregularly. °· A feeling of discomfort or pain in your chest. °· Shortness of breath. °· Sudden light-headedness or weakness. °· Getting tired easily during exercise. °In some cases, there are no symptoms. °DIAGNOSIS °Your health care provider may be able to detect atrial fibrillation when taking your pulse. If detected, this condition may be diagnosed with: °· An electrocardiogram (ECG). °· A Holter monitor test that records your heartbeat patterns over a 24-hour period. °· Transthoracic echocardiogram (TTE) to evaluate how blood flows through your heart. °· Transesophageal echocardiogram (TEE) to view more detailed images of your heart. °· A stress test. °· Imaging tests, such as a CT scan or chest X-ray. °· Blood tests. °TREATMENT °The main goals of treatment are to prevent blood clots from forming and to keep your heart beating at a normal rate and rhythm. The type of treatment that you receive depends on many factors, such as your underlying medical conditions and how you feel when you are experiencing atrial fibrillation. °This condition may be treated with: °· Medicine to slow down the heart rate, bring the heart's rhythm back to normal, or prevent clots from forming. °· Electrical cardioversion. This is a procedure that resets your heart's rhythm by delivering a controlled, low-energy shock to the heart through your skin. °· Different types of ablation, such as catheter ablation, catheter ablation with pacemaker, or surgical ablation. These procedures destroy the heart tissues that send abnormal signals. When the pacemaker is used, it is placed under your skin to help your heart beat in a regular rhythm. °HOME CARE INSTRUCTIONS °· Take over-the counter and prescription medicines only as told by your health care provider. °·   If your health care provider prescribed a blood-thinning medicine  (anticoagulant), take it exactly as told. Taking too much blood-thinning medicine can cause bleeding. If you do not take enough blood-thinning medicine, you will not have the protection that you need against stroke and other problems.  Do not use tobacco products, including cigarettes, chewing tobacco, and e-cigarettes. If you need help quitting, ask your health care provider.  If you have obstructive sleep apnea, manage your condition as told by your health care provider.  Do not drink alcohol.  Do not drink beverages that contain caffeine, such as coffee, soda, and tea.  Maintain a healthy weight. Do not use diet pills unless your health care provider approves. Diet pills may make heart problems worse.  Follow diet instructions as told by your health care provider.  Exercise regularly as told by your health care provider.  Keep all follow-up visits as told by your health care provider. This is important. PREVENTION  Avoid drinking beverages that contain caffeine or alcohol.  Avoid certain medicines, especially medicines that are used for breathing problems.  Avoid certain herbs and herbal medicines, such as those that contain ephedra or ginseng.  Do not use illegal drugs, such as cocaine and amphetamines.  Do not smoke.  Manage your high blood pressure. SEEK MEDICAL CARE IF:  You notice a change in the rate, rhythm, or strength of your heartbeat.  You are taking an anticoagulant and you notice increased bruising.  You tire more easily when you exercise or exert yourself. SEEK IMMEDIATE MEDICAL CARE IF:  You have chest pain, abdominal pain, sweating, or weakness.  You feel nauseous.  You notice blood in your vomit, bowel movement, or urine.  You have shortness of breath.  You suddenly have swollen feet and ankles.  You feel dizzy.  You have sudden weakness or numbness of the face, arm, or leg, especially on one side of the body.  You have trouble speaking,  trouble understanding, or both (aphasia).  Your face or your eyelid droops on one side. These symptoms may represent a serious problem that is an emergency. Do not wait to see if the symptoms will go away. Get medical help right away. Call your local emergency services (911 in the [place] Do not drive yourself to the hospital.   This information is not intended to replace advice given to you by your health care provider. Make sure you discuss any questions you have with your health care provider.   Document Released: 07/06/2005 Document Revised: 03/27/2015 Document Reviewed: 10/31/2014 Elsevier Interactive Patient Education 2016 [employer].  Ischemic Stroke Treated Without Warfarin An ischemic stroke (cerebrovascular accident) is the sudden death of brain tissue. It is a medical emergency. An ischemic stroke can cause permanent loss of brain function. This can cause problems with different parts of your body. CAUSES An ischemic stroke is caused by a decrease of oxygen supply to an area of your brain. It is usually the result of a small blood clot (embolus) or collection of cholesterol or fat (plaque) that blocks blood flow in the brain. An ischemic stroke can also be caused by blocked or damaged carotid arteries. RISK FACTORS  High blood pressure (hypertension).  High cholesterol.  Diabetes mellitus.  Heart disease.  The buildup of plaque in the blood vessels (peripheral artery disease or atherosclerosis).  The buildup of plaque in the blood vessels that provide blood and oxygen to the brain (carotid artery stenosis).  An abnormal heart rhythm (atrial fibrillation).  Obesity.  Smoking cigarettes.  Taking oral contraceptives, especially in combination with using tobacco.  Physical inactivity.  A diet that is high in fats, salt (sodium), and calories.  Excessive alcohol use.  Use of illegal drugs, especially cocaine and methamphetamine.  Being African American.  Being  over the age of 63 years.  Family history of stroke.  Previous history of blood clots, stroke, TIA (transient ischemic attack), or heart attack.  Sickle cell disease. SIGNS AND SYMPTOMS These symptoms usually develop suddenly, or you may notice them after waking up from sleep. Symptoms may include sudden:  Weakness or numbness in your face, arm, or leg, especially on one side of your body.  Confusion.  Trouble speaking (aphasia) or understanding speech.  Trouble seeing with one or both eyes.  Trouble walking or difficulty moving your arms or legs.  Dizziness.  Loss of balance or coordination.  Severe headache with no known cause. The headache is often described as the worst headache ever experienced. DIAGNOSIS Your health care provider can often determine the presence or absence of an ischemic stroke based on your symptoms, history, and physical exam. CT (computed tomography) of the brain is usually performed to confirm the stroke, determine causes, and determine stroke severity. Other tests may be done to find the cause of the stroke. These tests may include:  ECG (electrocardiogram).  Continuous heart monitoring.  Echocardiogram.  Carotid ultrasound.  MRI.  A scan of the brain circulation.  Blood tests. TREATMENT It is very important to seek treatment at the first sign of stroke symptoms. Your health care provider may perform the following treatments within 6 hours of the onset of stroke symptoms:  Medicine to dissolve the blood clot (thrombolytic).  Inserting a device into the affected artery to remove the blood clot. These treatments may not be effective if too much time has passed since your stroke symptoms began. Even if you do not know when your symptoms began, get treatment as soon as possible. There are other treatment options that may be given, such as:  Oxygen.  IV fluids.  Medicines to thin the blood (anticoagulants).  A procedure to widen blocked  arteries. Your treatment will depend on how long you have had your symptoms, the severity of your symptoms, and the cause of your symptoms. Your health care provider will take measures to prevent short-term and long-term complications of stroke, such as:  Breathing foreign material into the lungs (aspiration pneumonia).  Blood clots in the legs.  Bedsores.  Falls. Medicines and dietary changes may be used to help treat and manage risk factors for stroke, such as diabetes and high blood pressure. If any of your body's functions were impaired by stroke, you may work with physical, speech, or occupational therapists to help you recover. HOME CARE INSTRUCTIONS  Take medicines only as directed by your health care provider. Follow the directions carefully. Medicines may be used to control risk factors for a stroke. Be sure that you understand all your medicine instructions.  If swallow studies have determined that your swallowing reflex is present, you should eat healthy foods. Foods may need to be a soft or pureed consistency, or you may need to take small bites in order to avoid aspirating or choking.  Follow physical activity guidelines as directed by your health care team.  Do not use any tobacco products, including cigarettes, chewing tobacco, or electronic cigarettes. If you smoke, quit. If you need help quitting, ask your health care provider.  Limit or stop alcohol use.  A safe home environment is important to reduce the risk of falls. Your health care provider may arrange for specialists to evaluate your home. Having grab bars in the bedroom and bathroom is often important. Your health care provider may arrange for equipment to be used at home, such as raised toilets and a seat for the shower.  Ongoing physical, occupational, and speech therapy may be needed to maximize your recovery after a stroke. If you have been advised to use a walker or a cane, use it at all times. Be sure to keep  your therapy appointments.  Keep all follow-up visits with your health care provider. This is very important. This includes any referrals, therapy, rehabilitation, and lab tests. Proper follow-up can prevent another stroke from occurring. PREVENTION The risk of a stroke can be decreased by appropriately treating high blood pressure, high cholesterol, diabetes, heart disease, and obesity. It can also be decreased by quitting smoking, limiting alcohol, and staying physically active. SEEK IMMEDIATE MEDICAL CARE IF:  You have sudden weakness or numbness in your face, arm, or leg, especially on one side of your body.  You have sudden confusion.  You have sudden trouble speaking (aphasia) or understanding.  You have sudden trouble seeing with one or both eyes.  You have sudden trouble walking or difficulty moving your arms or legs.  You have sudden dizziness.  You have a sudden loss of balance or coordination.  You have a sudden, severe headache with no known cause.  You have a partial or total loss of consciousness. Any of these symptoms may represent a serious problem that is an emergency. Do not wait to see if the symptoms will go away. Get medical help right away. Call your local emergency services (911 in [place] Do not drive yourself to the hospital.   This information is not intended to replace advice given to you by your health care provider. Make sure you discuss any questions you have with your health care provider.   Document Released: 04/20/2014 Document Reviewed: 04/20/2014 Elsevier Interactive Patient Education [employer].

## 2016-01-31 NOTE — Progress Notes (Signed)
  Echocardiogram 2D Echocardiogram has been performed.  [name] 01/31/2016, 11:26 AM

## 2016-01-31 NOTE — Progress Notes (Signed)
ANTICOAGULATION CONSULT NOTE - Follow-up Consult  Pharmacy Consult for Heparin Indication: atrial fibrillation and stroke  No Known Allergies  Patient Measurements: Height: 5\' 11"  (180.3 cm) Weight: 240 lb (108.863 kg) IBW/kg (Calculated) : 75.3 Heparin Dosing Weight: 98 kg  Vital Signs: Temp: 98.4 F (36.9 C) (07/14 0649) Temp Source: Oral (07/14 0649) BP: 133/75 mmHg (07/14 0649) Pulse Rate: 76 (07/14 0649)  Labs:  Recent Labs  01/28/16 1951 01/29/16 0540 01/29/16 1516 01/30/16 0425  01/30/16 1215 01/31/16 0105 01/31/16 0832  HGB 13.4 12.9*  --  12.6*  --   --   --   --   HCT 40.4 39.9  --  38.8*  --   --   --   --   PLT 310 271  --  289  --   --   --   --   APTT  --   --   --  34  --   --   --   --   LABPROT  --   --   --  13.9  --   --   --   --   INR  --   --   --  1.05  --   --   --   --   HEPARINUNFRC  --   --   --   --   < > <0.10* <0.10* 0.18*  CREATININE 0.95 0.95  --  0.88  --   --   --   --   TROPONINI  --   --  <0.03  --   --   --   --   --   < > = values in this interval not displayed.  Estimated Creatinine Clearance: 110.6 mL/min (by C-G formula based on Cr of 0.88).  Assessment: 60yo male presented 01/28/16 PM. Admitted  with stroke and was found to be in Afib. Pharmacy was consulted on 7/12 to dose heparin for stroke - recommended by neurology. Heparin level this AM is 0.18, still subtherapeutic but has  finally started to increase toward goal after heparin rate increased to 1750 units/hr ~2am this AM. Previous heparin levels have been undetectable, although usefullness of these low HLs complicated due to IV occlusion and infusion had been stopped and resumed yesterday. Patient is obese, TBW 108.9 kg,  Dosing weight 98kg. No issues with line or bleeding reported today per RN.  Goal of Therapy:  Heparin level 0.3-0.5 units/ml Monitor platelets by anticoagulation protocol: Yes   Plan:  Increase heparin to 2000 units/hr F/u 6 hr heparin level Daily  HL and CBC.  Nicole Cella, RPh Clinical Pharmacist Pager: [phone] 01/31/2016,9:28 AM

## 2016-01-31 NOTE — Progress Notes (Signed)
Occupational Therapy Treatment Patient Details Name: [name] MRN: [id number] DOB: [birth year] Today's Date: 01/31/2016    History of present illness [name] is a 60 y.o. male with history of stroke in March 2017, CAD status post CABG, diabetes mellitus type 2, hypertriglyceridemia, peripheral vascular disease status post femoro/tibial bypass in February 2017. Pt was admitted to the hospital due to persistent difficulty with expressive aphasia and dizziness. MRI of the brain shows left pontine infarct.   OT comments  Pt provided fine motor handout and demonstration. Pt provided small objects to pick up off table surface and place in foam cup with small hole in lid. Pt at adequate level to return home at this time.    Follow Up Recommendations  Outpatient OT;Supervision - Intermittent    Equipment Recommendations  3 in 1 bedside comode    Recommendations for Other Services      Precautions / Restrictions Precautions Precautions: Fall       Mobility Bed Mobility Overal bed mobility: Needs Assistance Bed Mobility: Supine to Sit     Supine to sit: Min guard;HOB elevated     General bed mobility comments: heavy use of bed rails  Transfers                      Balance                                   ADL Overall ADL's : Needs assistance/impaired                     Lower Body Dressing: Modified independent;With adaptive equipment Lower Body Dressing Details (indicate cue type and reason): pt educated on sock aide and where he can purchase. pt plans to purchase [employer] and has the number written down Toilet Transfer: Copy Details (indicate cue type and reason): simulated eob to chair           General ADL Comments: pt educated on fine motor with x2 handout and reviewed in detail. pt with good return. pt with girlfriend present. Pt educated on use of cell phone apps to help with fine motor       Vision                     Perception     Praxis      Cognition   Behavior During Therapy: [facility] for tasks assessed/performed Overall Cognitive Status: Within Functional Limits for tasks assessed                       Extremity/Trunk Assessment               Exercises     Shoulder Instructions       General Comments      Pertinent Vitals/ Pain       Pain Assessment: No/denies pain  Home Living                                          Prior Functioning/Environment              Frequency Min 2X/week     Progress Toward Goals  OT Goals(current goals can now be found in the care plan section)  Progress towards  OT goals: Progressing toward goals  Acute Rehab OT Goals Patient Stated Goal: Get back to normal OT Goal Formulation: With patient Time For Goal Achievement: 02/13/16 Potential to Achieve Goals: Good ADL Goals Pt Will Perform Lower Body Bathing: with set-up;with supervision;with caregiver independent in assisting;sit to/from stand;with adaptive equipment Pt Will Perform Lower Body Dressing: with set-up;with supervision;with adaptive equipment;with caregiver independent in assisting;sit to/from stand Pt Will Transfer to Toilet: with modified independence;ambulating;bedside commode Pt/caregiver will Perform Home Exercise Program: Right Upper extremity;With Supervision;With written HEP provided  Plan Discharge plan remains appropriate    Co-evaluation                 End of Session Equipment Utilized During Treatment: Gait belt   Activity Tolerance Patient tolerated treatment well   Patient Left in chair;with call bell/phone within reach;with family/visitor present   Nurse Communication Mobility status        Time: [id number] OT Time Calculation (min): 20 min  Charges: OT General Charges $OT Visit: 1 Procedure OT Treatments $Therapeutic Exercise: 8-22 mins  [name] 01/31/2016, 8:08  AM   [name]   OTR/L Pager: [phone] Office: [phone] .

## 2016-01-31 NOTE — Progress Notes (Signed)
STROKE TEAM PROGRESS NOTE    SUBJECTIVE (INTERVAL HISTORY) His family is not at the bedside .He is neurologically stable. Cerebral catheter angiogram yesterday showed severe basilar stenosis. Plans are for elective revascularization next week.. Patient wants to go home and return for the procedure  OBJECTIVE Temp:  [98 F (36.7 C)-99.6 F (37.6 C)] 98.2 F (36.8 C) (07/14 1400) Pulse Rate:  [67-91] 80 (07/14 1400) Cardiac Rhythm:  [-] Heart block (07/14 0700) Resp:  [16-18] 17 (07/14 1400) BP: (129-144)/(58-75) 138/72 mmHg (07/14 1400) SpO2:  [96 %-99 %] 99 % (07/14 1400)  CBC:   Recent Labs Lab 01/30/16 0425 01/31/16 1603  WBC 10.4 8.8  HGB 12.6* 13.2  HCT 38.8* 39.9  MCV 86.6 86.7  PLT 289 [id number]    Basic Metabolic Panel:   Recent Labs Lab 01/29/16 0540 01/30/16 0425  NA 136 135  K 4.4 4.1  CL 102 102  CO2 25 26  GLUCOSE 357* 251*  BUN 17 19  CREATININE 0.95 0.88  CALCIUM 9.4 9.3    Lipid Panel:     Component Value Date/Time   CHOL 523* 01/29/2016 0538   TRIG 1925* 01/29/2016 0538   HDL NOT REPORTED DUE TO HIGH TRIGLYCERIDES 01/29/2016 0538   CHOLHDL NOT REPORTED DUE TO HIGH TRIGLYCERIDES 01/29/2016 0538   VLDL UNABLE TO CALCULATE IF TRIGLYCERIDE OVER 400 mg/dL 01/29/2016 0538   LDLCALC UNABLE TO CALCULATE IF TRIGLYCERIDE OVER 400 mg/dL 01/29/2016 0538   HgbA1c:  Lab Results  Component Value Date   HGBA1C 11.5* 01/29/2016   Urine Drug Screen:     Component Value Date/Time   LABOPIA NONE DETECTED 01/29/2016 1326   COCAINSCRNUR NONE DETECTED 01/29/2016 1326   LABBENZ NONE DETECTED 01/29/2016 1326   AMPHETMU NONE DETECTED 01/29/2016 1326   THCU NONE DETECTED 01/29/2016 1326   LABBARB NONE DETECTED 01/29/2016 1326      IMAGING  Ct Angio Head W Or Wo Contrast  01/29/2016  CLINICAL DATA:  Followup code stroke.  New neurological symptoms. EXAM: CT ANGIOGRAPHY HEAD AND NECK TECHNIQUE: Multidetector CT imaging of the head and neck was performed using  the standard protocol during bolus administration of intravenous contrast. Multiplanar CT image reconstructions and MIPs were obtained to evaluate the vascular anatomy. Carotid stenosis measurements (when applicable) are obtained utilizing NASCET criteria, using the distal internal carotid diameter as the denominator. CONTRAST:  50 cc Isovue 370 COMPARISON:  MR exams same day.  CT 01/28/2016.  MRI 10/11/2015. FINDINGS: CT HEAD No sign of acute infarction by CT. Acute infarction demonstrated in the pons by MRI is not resolved. No cerebellar insult. Old lacunar infarctions in the basal ganglia appear the same. Mild chronic small-vessel ischemic changes of the white matter appear the same. No hemorrhage, hydrocephalus or extra-axial collection. No calvarial abnormality. Sinuses are clear. CTA NECK Aortic arch: Aortic atherosclerosis. No aneurysm or dissection. Previous CABG. Right carotid system: Common carotid artery widely patent to the bifurcation. Atherosclerotic plaque affecting the carotid bifurcation. Minimal diameter in the ICA bulb measures 3.5 mm. Compared to a more distal cervical ICA diameter of 5 mm, this indicates a 30% stenosis. Left carotid system: Common carotid artery widely patent to the bifurcation. Mild atherosclerotic plaque at carotid bifurcation but no stenosis. 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 Left vertebral artery origin widely patent. Left vertebral is a large vessel widely patent through the cervical region. Skeleton: Ordinary mild cervical spondylosis. Other neck: No mass or lymphadenopathy.  Lung apices are clear. CTA HEAD  Anterior circulation: Internal carotid arteries are widely patent through the skullbase and siphon regions. Atherosclerotic calcification in the carotid siphon regions but no stenosis greater than 30%. Supraclinoid internal carotid artery is widely patent. The anterior and middle cerebral vessels are patent without proximal stenosis,  aneurysm or vascular malformation. No missing branch vessels detected. Posterior circulation: No antegrade flow in the distal right vertebral artery. Left vertebral artery is widely patent to the foramen magnum. There is atherosclerotic disease of the left vertebral artery at the foramen magnum with stenosis of 50%. Beyond that, the vertebral is widely patent to the basilar. Retrograde flow in the right vertebral artery to PICA. Focal stenosis of the basilar artery as demonstrated at MR angiography estimated at 70%. Venous sinuses: Patent and normal Anatomic variants: None significant Delayed phase: No abnormal enhancement IMPRESSION: Atherosclerotic plaque affecting the right carotid bifurcation. 30% stenosis of the proximal ICA. No left carotid bifurcation stenosis. 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 Left vertebral artery widely patent in the neck. 50% stenosis of the left vertebral artery at the foramen magnum. 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 30% stenosis of the right carotid siphon. Anterior and middle cerebral vessels widely patent. CT the brain does not show any new finding. Electronically Signed   By: Nelson Chimes M.D.   On: 01/29/2016 19:41   Ct Head Wo Contrast  01/29/2016  CLINICAL DATA:  Followup code stroke.  New neurological symptoms. EXAM: CT ANGIOGRAPHY HEAD AND NECK TECHNIQUE: Multidetector CT imaging of the head and neck was performed using the standard protocol during bolus administration of intravenous contrast. Multiplanar CT image reconstructions and MIPs were obtained to evaluate the vascular anatomy. Carotid stenosis measurements (when applicable) are obtained utilizing NASCET criteria, using the distal internal carotid diameter as the denominator. CONTRAST:  50 cc Isovue 370 COMPARISON:  MR exams same day.  CT 01/28/2016.  MRI 10/11/2015. FINDINGS: CT HEAD No sign of acute infarction by CT. Acute infarction demonstrated in the pons by MRI is not resolved. No  cerebellar insult. Old lacunar infarctions in the basal ganglia appear the same. Mild chronic small-vessel ischemic changes of the white matter appear the same. No hemorrhage, hydrocephalus or extra-axial collection. No calvarial abnormality. Sinuses are clear. CTA NECK Aortic arch: Aortic atherosclerosis. No aneurysm or dissection. Previous CABG. Right carotid system: Common carotid artery widely patent to the bifurcation. Atherosclerotic plaque affecting the carotid bifurcation. Minimal diameter in the ICA bulb measures 3.5 mm. Compared to a more distal cervical ICA diameter of 5 mm, this indicates a 30% stenosis. Left carotid system: Common carotid artery widely patent to the bifurcation. Mild atherosclerotic plaque at carotid bifurcation but no stenosis. 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 Left vertebral artery origin widely patent. Left vertebral is a large vessel widely patent through the cervical region. Skeleton: Ordinary mild cervical spondylosis. Other neck: No mass or lymphadenopathy.  Lung apices are clear. CTA HEAD Anterior circulation: Internal carotid arteries are widely patent through the skullbase and siphon regions. Atherosclerotic calcification in the carotid siphon regions but no stenosis greater than 30%. Supraclinoid internal carotid artery is widely patent. The anterior and middle cerebral vessels are patent without proximal stenosis, aneurysm or vascular malformation. No missing branch vessels detected. Posterior circulation: No antegrade flow in the distal right vertebral artery. Left vertebral artery is widely patent to the foramen magnum. There is atherosclerotic disease of the left vertebral artery at the foramen magnum with stenosis of 50%. Beyond that, the vertebral is  widely patent to the basilar. Retrograde flow in the right vertebral artery to PICA. Focal stenosis of the basilar artery as demonstrated at MR angiography estimated at 70%. Venous  sinuses: Patent and normal Anatomic variants: None significant Delayed phase: No abnormal enhancement IMPRESSION: Atherosclerotic plaque affecting the right carotid bifurcation. 30% stenosis of the proximal ICA. No left carotid bifurcation stenosis. 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 Left vertebral artery widely patent in the neck. 50% stenosis of the left vertebral artery at the foramen magnum. 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 30% stenosis of the right carotid siphon. Anterior and middle cerebral vessels widely patent. CT the brain does not show any new finding. Electronically Signed   By: Nelson Chimes M.D.   On: 01/29/2016 19:41   Ct Angio Neck W Or Wo Contrast  01/29/2016  CLINICAL DATA:  Followup code stroke.  New neurological symptoms. EXAM: CT ANGIOGRAPHY HEAD AND NECK TECHNIQUE: Multidetector CT imaging of the head and neck was performed using the standard protocol during bolus administration of intravenous contrast. Multiplanar CT image reconstructions and MIPs were obtained to evaluate the vascular anatomy. Carotid stenosis measurements (when applicable) are obtained utilizing NASCET criteria, using the distal internal carotid diameter as the denominator. CONTRAST:  50 cc Isovue 370 COMPARISON:  MR exams same day.  CT 01/28/2016.  MRI 10/11/2015. FINDINGS: CT HEAD No sign of acute infarction by CT. Acute infarction demonstrated in the pons by MRI is not resolved. No cerebellar insult. Old lacunar infarctions in the basal ganglia appear the same. Mild chronic small-vessel ischemic changes of the white matter appear the same. No hemorrhage, hydrocephalus or extra-axial collection. No calvarial abnormality. Sinuses are clear. CTA NECK Aortic arch: Aortic atherosclerosis. No aneurysm or dissection. Previous CABG. Right carotid system: Common carotid artery widely patent to the bifurcation. Atherosclerotic plaque affecting the carotid bifurcation. Minimal diameter in the ICA bulb  measures 3.5 mm. Compared to a more distal cervical ICA diameter of 5 mm, this indicates a 30% stenosis. Left carotid system: Common carotid artery widely patent to the bifurcation. Mild atherosclerotic plaque at carotid bifurcation but no stenosis. 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 Left vertebral artery origin widely patent. Left vertebral is a large vessel widely patent through the cervical region. Skeleton: Ordinary mild cervical spondylosis. Other neck: No mass or lymphadenopathy.  Lung apices are clear. CTA HEAD Anterior circulation: Internal carotid arteries are widely patent through the skullbase and siphon regions. Atherosclerotic calcification in the carotid siphon regions but no stenosis greater than 30%. Supraclinoid internal carotid artery is widely patent. The anterior and middle cerebral vessels are patent without proximal stenosis, aneurysm or vascular malformation. No missing branch vessels detected. Posterior circulation: No antegrade flow in the distal right vertebral artery. Left vertebral artery is widely patent to the foramen magnum. There is atherosclerotic disease of the left vertebral artery at the foramen magnum with stenosis of 50%. Beyond that, the vertebral is widely patent to the basilar. Retrograde flow in the right vertebral artery to PICA. Focal stenosis of the basilar artery as demonstrated at MR angiography estimated at 70%. Venous sinuses: Patent and normal Anatomic variants: None significant Delayed phase: No abnormal enhancement IMPRESSION: Atherosclerotic plaque affecting the right carotid bifurcation. 30% stenosis of the proximal ICA. No left carotid bifurcation stenosis. 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 Left vertebral artery widely patent in the neck. 50% stenosis of the left vertebral artery at the foramen magnum. 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  30% stenosis of the right carotid siphon. Anterior and middle cerebral  vessels widely patent. CT the brain does not show any new finding. Electronically Signed   By: Nelson Chimes M.D.   On: 01/29/2016 19:41   Cerebral catheter angio 01/30/16 shows severe focal midbasilar stenosis  PHYSICAL EXAM Exam: Gen: NAD, conversant                CV: RRR, no MRG. No Carotid Bruits. No peripheral edema, warm, nontender Eyes: Conjunctivae clear without exudates or hemorrhage  Neuro: Detailed Neurologic Exam  Speech:    Speech is mildly dysarthric Cognition:    The patient is oriented to person, place, and time;  Cranial Nerves:    The pupils are equal, round, and reactive to light. The fundi are normal and spontaneous venous pulsations are present. Visual fields are full to finger confrontation. Extraocular movements are intact. Trigeminal sensation is intact and the muscles of mastication are normal. Left droop. The palate elevates in the midline. Hearing intact. Voice is normal. Shoulder shrug is normal. The tongue has normal motion without fasciculations.   Coordination:    No dysmetria  Motor Observation:    No asymmetry, no atrophy, and no involuntary movements noted. Tone:    Normal muscle tone.     Strength:    Strength is V/V in the upper and lower limbs.      Sensation: intact to LT  DTR's:    Deep tendon reflexes in the upper and lower extremities are normal bilaterally.   Toes:    The toes are downgoing bilaterally.   Clonus:    Clonus is absent.    ASSESSMENT/PLAN [name] is a 60 y.o. male with history of stroke in March 2017, CAD status post CABG, diabetes mellitus type 2, hypertriglyceridemia, peripheral vascular disease status post femoro/tibial bypass in February 2017 presenting with expressive aphasia and dizziness. He did not receive IV t-PA due to delay in arrival.   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 Old right pontine  infarct. 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 Chronic right VA occlusion.progress atherosclerotic disease left VA. Stable anterior circulation  Carotid Doppler  No carotid disease per Doppler done March 2017  2D Echo  No source of embolus, EF 55-60% per echo done March 2017. 2-D echo this admission pending.  Cerebral angiogram pending (progressive basilar artery stenosis, ? Need for stent. Concerned for dual antiplatelets in setting of anticoagulation)  LDL unable to calculate  HgbA1c 10.3 and March  Lovenox 40 mg sq daily for VTE prophylaxis Diet Carb Modified Fluid consistency:: Thin; Room service appropriate?: Yes  aspirin 325 mg daily and clopidogrel 75 mg daily prior to admission, now on aspirin 325 mg daily and clopidogrel 75 mg daily  Patient counseled to be compliant with his antithrombotic medications  Ongoing aggressive stroke risk factor management  Therapy recommendations:  OP OT, PT  Disposition:  pending   Take it easy today, monitor  Consider discharge today with elcetive basilar stenting next weekting   Atrial Fibrillation  New diagnosis  Not felt to be cause of recurrent posterior circulation infarcts  Agree with IV heparin with transition to [facility] following cerebral angiogram   Hypertension  Stable  Permissive hypertension (OK if < 220/120)  but gradually normalize in 5-7 days  Long-term BP goal normotensive  Hyperlipidemia Hypertriglyceridemia  Home meds:  Lipitor 80, and Tricor 145, resumed in hospital  LDL unable to calculate, goal < 70  Lipids poorly controlled  Continue statin at discharge  Diabetes type II  HgbA1c 10.3 in March, goal < 7.0  Uncontrolled  Other Stroke Risk Factors  Obesity, Body mass index is 33.49 kg/(m^2)., recommend weight loss, diet and  exercise as appropriate   Hx stroke/TIA  R pontine infarct i setting of R VA occlusion and moderate to severe VA stenosis  Coronary artery disease s/p CABG 2002  PVD s/p feb-tib bypass 08/2015  Hospital day # [place] for Pager information 01/31/2016 5:13 PM  I have personally examined this patient, reviewed notes, independently viewed imaging studies, participated in medical decision making and plan of care. I have made any additions or clarifications directly to the above note. Agree with note above. The patient has symptomatic severe basilar stenosis as well as new onset atrial fibrillation flutter which puts him at significant risk for recurrent stroke and TIAs. He has failed medical therapy and may be considered for endovascular treatment with basilar angioplasty/stenting but he is at high risk for bleeding as he likely will need to stay on long-term anticoagulation plus antiplatelet therapy if he gets a stent. I had a long discussion with the patient, and discussed this difficult and tricky situation. They voiced understanding. Greater than 50% time during this 25 minute visit was spent on counseling and coordination of care about his stroke risk, prevention and treatment options Recommend discharge home today on aspirin and Plavix and return for elective basilar stenting next week on next Tuesday. Start anticoagulation with eliquis for atrial fibrillation after the basilar stenting procedure Discussed with the patient, Dr. Estanislado Pandy and Dr. Queen Slough, [place] Pager: [phone] 01/31/2016 5:13 PM   To contact Stroke Continuity provider, please refer to [email or web address] After hours, contact General Neurology

## 2016-01-31 NOTE — Progress Notes (Signed)
ANTICOAGULATION CONSULT NOTE - Follow-up Consult  Pharmacy Consult for Heparin Indication: atrial fibrillation and stroke  No Known Allergies  Patient Measurements: Height: 5\' 11"  (180.3 cm) Weight: 240 lb (108.863 kg) IBW/kg (Calculated) : 75.3 Heparin Dosing Weight: 98 kg  Vital Signs: Temp: 98.4 F (36.9 C) (07/14 0139) Temp Source: Oral (07/14 0139) BP: 134/58 mmHg (07/14 0139) Pulse Rate: 71 (07/14 0139)  Labs:  Recent Labs  01/28/16 1951 01/29/16 0540 01/29/16 1516 01/30/16 0425 01/30/16 0434 01/30/16 1215 01/31/16 0105  HGB 13.4 12.9*  --  12.6*  --   --   --   HCT 40.4 39.9  --  38.8*  --   --   --   PLT 310 271  --  289  --   --   --   APTT  --   --   --  34  --   --   --   LABPROT  --   --   --  13.9  --   --   --   INR  --   --   --  1.05  --   --   --   HEPARINUNFRC  --   --   --   --  <0.10* <0.10* <0.10*  CREATININE 0.95 0.95  --  0.88  --   --   --   TROPONINI  --   --  <0.03  --   --   --   --     Estimated Creatinine Clearance: 110.6 mL/min (by C-G formula based on Cr of 0.88).  Assessment: 60yo male presented 01/28/16 PM. Admitted  with stroke and was found to be in Afib. Pharmacy was consulted on 7/12 to dose heparin for stroke - recommended by neurology. Heparin level continues to be undetectable on 1450 units/hr. No issues with line or bleeding reported per RN.  Goal of Therapy:  Heparin level 0.3-0.5 units/ml Monitor platelets by anticoagulation protocol: Yes   Plan:  Increase heparin to 1750 units/hr F/u 6 hr heparin level  Sherlon Handing, PharmD, BCPS Clinical pharmacist, pager [phone] 01/31/2016,2:07 AM

## 2016-01-31 NOTE — Care Management Note (Signed)
Case Management Note  Patient Details  Name: [name] MRN: [id number] Date of Birth: [birth year]  Subjective/Objective:                    Action/Plan: Plan is for patient to discharge home today with family. CM consulted for outpatient therapy. CM met with the patient and his family and asked about outpatient therapy. Pt interested in attending therapy at the [place] [facility]. Orders placed in EPIC and information on the AVS. Bedside RN updated.   Expected Discharge Date:  01/31/16               Expected Discharge Plan:  Home/Self Care  In-House Referral:     Discharge planning Services     Post Acute Care Choice:    Choice offered to:     DME Arranged:    DME Agency:     HH Arranged:    HH Agency:     Status of Service:  Completed, signed off  If discussed at [employer] of Stay Meetings, dates discussed:    Additional Comments:  Pollie Friar, RN 01/31/2016, 4:54 PM

## 2016-01-31 NOTE — Progress Notes (Signed)
   I spoke with Dr.Deveshwar this a.m. The patient has severe distal basal artery obstruction and will require interventional therapy. Typically after intracranial interventional procedures, dual antiplatelet therapy will be required.  I recommend continuing IV heparin as protection against embolic CVA due to intermittent atrial fibrillation. Would not start oral anticoagulation therapy until intracranial endovascular therapy has been completed.  Postprocedure the patient may require triple drug therapy for a period of time (aspirin/Plavix/anticoagulation). Follow recommendations concerning the exact treatment components will be dependent upon course  Will resume aspirin and Plavix today in preparation for basal artery intervention.

## 2016-01-31 NOTE — Progress Notes (Signed)
Discharge instruction reviewed with patient/family. All questions answered at this time. Transport home by family.  Kymani Shimabukuro, RN 

## 2016-01-31 NOTE — Progress Notes (Signed)
Physical Therapy Treatment Patient Details Name: [name] MRN: [id number] DOB: 1955/07/25 Today's Date: 01/31/2016    History of Present Illness BENCE MI is a 60 y.o. male with history of stroke in March 2017, CAD status post CABG, diabetes mellitus type 2, hypertriglyceridemia, peripheral vascular disease status post femoro/tibial bypass in February 2017. Pt was admitted to the hospital due to persistent difficulty with expressive aphasia and dizziness. MRI of the brain shows left pontine infarct.    PT Comments    Patient continues to demo balance deficits especially with vertical head turns. Min guard/min A for safe ambulation with unsteady gait pattern. Current plan remains appropriate.   Follow Up Recommendations  Outpatient PT;Supervision - Intermittent (OP Neuro)     Equipment Recommendations  None recommended by PT    Recommendations for Other Services Rehab consult     Precautions / Restrictions Precautions Precautions: Fall Restrictions Weight Bearing Restrictions: No    Mobility  Bed Mobility               General bed mobility comments: OOB in chair upon arrival  Transfers Overall transfer level: Needs assistance Equipment used: None   Sit to Stand: Supervision         General transfer comment: supervision for safety  Ambulation/Gait Ambulation/Gait assistance: Min guard;Min assist Ambulation Distance (Feet): 315 Feet Assistive device: None Gait Pattern/deviations: Step-through pattern;Staggering left;Staggering right;Wide base of support     General Gait Details: pt with unsteady gait and required min A to regain balance X2 with vertical head turns   Stairs   Stairs assistance: Min guard Stair Management: Two rails;Forwards Number of Stairs:  (2 steps X2 and 3 steps X 2) General stair comments: Pt initially used two rails but was able to use just the right when prompted.  Wheelchair Mobility    Modified Rankin (Stroke Patients  Only) Modified Rankin (Stroke Patients Only) Pre-Morbid Rankin Score: No symptoms Modified Rankin: Moderate disability     Balance     Sitting balance-Leahy Scale: Good       Standing balance-Leahy Scale: Fair                      Cognition Arousal/Alertness: Awake/alert Behavior During Therapy: WFL for tasks assessed/performed Overall Cognitive Status: Within Functional Limits for tasks assessed                      Exercises      General Comments        Pertinent Vitals/Pain Pain Assessment: No/denies pain    Home Living                      Prior Function            PT Goals (current goals can now be found in the care plan section) Acute Rehab PT Goals Patient Stated Goal: Work on speech. PT Goal Formulation: With patient Time For Goal Achievement: 02/05/16 Potential to Achieve Goals: Good Progress towards PT goals: Progressing toward goals    Frequency  Min 4X/week    PT Plan Current plan remains appropriate    Co-evaluation             End of Session Equipment Utilized During Treatment: Gait belt Activity Tolerance: Patient tolerated treatment well Patient left: in chair;with call bell/phone within reach;with family/visitor present     Time: [id number] PT Time Calculation (min) (ACUTE ONLY): 23 min  Charges:  $Gait  Training: 8-22 mins $Therapeutic Activity: 8-22 mins                       Salina April, [place] Pager: [phone]   01/31/2016, 3:10 PM

## 2016-02-03 ENCOUNTER — Other Ambulatory Visit (HOSPITAL_COMMUNITY): Payer: Self-pay | Admitting: *Deleted

## 2016-02-03 ENCOUNTER — Other Ambulatory Visit: Payer: Self-pay | Admitting: General Surgery

## 2016-02-03 ENCOUNTER — Telehealth (HOSPITAL_COMMUNITY): Payer: Self-pay | Admitting: *Deleted

## 2016-02-03 NOTE — Progress Notes (Signed)
I spoke with [name] and notified him of arrival time of 5:30 am, main entrance [facility].  Patient was instructed to not take any Insulin in am.  I told him to not take Metformin in am. I instructed patient to take 1/2 of evening dose of Levemir- patient reports that he takes 75 units 2 times a day.  I asked him to take 37 units of Levemir.  I also instructed patient to not eat or drink anything except a sip of water to take pills: Aspirin, Plavix, Metoprolol, Gabapentin.  I asked patient to check CBG in am and if CBG > 220 to take 1/2 of SS coverage per hospital protocal..

## 2016-02-04 ENCOUNTER — Ambulatory Visit (HOSPITAL_COMMUNITY): Payer: BLUE CROSS/BLUE SHIELD | Admitting: Certified Registered Nurse Anesthetist

## 2016-02-04 ENCOUNTER — Inpatient Hospital Stay (HOSPITAL_COMMUNITY)
Admission: RE | Admit: 2016-02-04 | Discharge: 2016-02-05 | DRG: 039 | Disposition: A | Discharge status: Home / Self Care | Payer: BLUE CROSS/BLUE SHIELD | Source: Ambulatory Visit | Attending: Interventional Radiology | Admitting: Interventional Radiology

## 2016-02-04 ENCOUNTER — Encounter (HOSPITAL_COMMUNITY): Payer: Self-pay

## 2016-02-04 ENCOUNTER — Ambulatory Visit (HOSPITAL_COMMUNITY)
Admission: RE | Admit: 2016-02-04 | Discharge: 2016-02-04 | Disposition: A | Discharge status: Home / Self Care | Payer: BLUE CROSS/BLUE SHIELD | Source: Ambulatory Visit | Attending: Nurse Practitioner | Admitting: Nurse Practitioner

## 2016-02-04 ENCOUNTER — Encounter (HOSPITAL_COMMUNITY): Payer: Self-pay | Admitting: Certified Registered Nurse Anesthetist

## 2016-02-04 ENCOUNTER — Encounter (HOSPITAL_COMMUNITY)
Admission: RE | Disposition: A | Discharge status: Home / Self Care | Payer: Self-pay | Source: Ambulatory Visit | Attending: Interventional Radiology

## 2016-02-04 ENCOUNTER — Ambulatory Visit (HOSPITAL_COMMUNITY)
Admission: RE | Admit: 2016-02-04 | Discharge: 2016-02-04 | Disposition: A | Discharge status: Home / Self Care | Payer: BLUE CROSS/BLUE SHIELD | Source: Ambulatory Visit | Attending: Interventional Radiology | Admitting: Interventional Radiology

## 2016-02-04 DIAGNOSIS — I252 Old myocardial infarction: Secondary | ICD-10-CM | POA: Diagnosis not present

## 2016-02-04 DIAGNOSIS — Z7984 Long term (current) use of oral hypoglycemic drugs: Secondary | ICD-10-CM

## 2016-02-04 DIAGNOSIS — Z85828 Personal history of other malignant neoplasm of skin: Secondary | ICD-10-CM | POA: Diagnosis not present

## 2016-02-04 DIAGNOSIS — M1990 Unspecified osteoarthritis, unspecified site: Secondary | ICD-10-CM | POA: Diagnosis present

## 2016-02-04 DIAGNOSIS — Z7982 Long term (current) use of aspirin: Secondary | ICD-10-CM

## 2016-02-04 DIAGNOSIS — Z794 Long term (current) use of insulin: Secondary | ICD-10-CM | POA: Diagnosis not present

## 2016-02-04 DIAGNOSIS — I2510 Atherosclerotic heart disease of native coronary artery without angina pectoris: Secondary | ICD-10-CM | POA: Diagnosis present

## 2016-02-04 DIAGNOSIS — M25511 Pain in right shoulder: Secondary | ICD-10-CM | POA: Diagnosis present

## 2016-02-04 DIAGNOSIS — I10 Essential (primary) hypertension: Secondary | ICD-10-CM | POA: Diagnosis present

## 2016-02-04 DIAGNOSIS — E785 Hyperlipidemia, unspecified: Secondary | ICD-10-CM | POA: Diagnosis present

## 2016-02-04 DIAGNOSIS — Z86718 Personal history of other venous thrombosis and embolism: Secondary | ICD-10-CM

## 2016-02-04 DIAGNOSIS — IMO0002 Reserved for concepts with insufficient information to code with codable children: Secondary | ICD-10-CM | POA: Diagnosis present

## 2016-02-04 DIAGNOSIS — E1151 Type 2 diabetes mellitus with diabetic peripheral angiopathy without gangrene: Secondary | ICD-10-CM | POA: Diagnosis present

## 2016-02-04 DIAGNOSIS — E781 Pure hyperglyceridemia: Secondary | ICD-10-CM | POA: Diagnosis present

## 2016-02-04 DIAGNOSIS — M25512 Pain in left shoulder: Secondary | ICD-10-CM | POA: Diagnosis present

## 2016-02-04 DIAGNOSIS — Z79899 Other long term (current) drug therapy: Secondary | ICD-10-CM | POA: Diagnosis not present

## 2016-02-04 DIAGNOSIS — Z951 Presence of aortocoronary bypass graft: Secondary | ICD-10-CM

## 2016-02-04 DIAGNOSIS — E1142 Type 2 diabetes mellitus with diabetic polyneuropathy: Secondary | ICD-10-CM | POA: Diagnosis present

## 2016-02-04 DIAGNOSIS — G8929 Other chronic pain: Secondary | ICD-10-CM | POA: Diagnosis present

## 2016-02-04 DIAGNOSIS — Z7902 Long term (current) use of antithrombotics/antiplatelets: Secondary | ICD-10-CM

## 2016-02-04 DIAGNOSIS — Z8673 Personal history of transient ischemic attack (TIA), and cerebral infarction without residual deficits: Secondary | ICD-10-CM | POA: Diagnosis not present

## 2016-02-04 DIAGNOSIS — I6322 Cerebral infarction due to unspecified occlusion or stenosis of basilar arteries: Secondary | ICD-10-CM | POA: Diagnosis present

## 2016-02-04 DIAGNOSIS — I4891 Unspecified atrial fibrillation: Secondary | ICD-10-CM | POA: Diagnosis present

## 2016-02-04 DIAGNOSIS — I639 Cerebral infarction, unspecified: Secondary | ICD-10-CM | POA: Diagnosis present

## 2016-02-04 DIAGNOSIS — E1159 Type 2 diabetes mellitus with other circulatory complications: Secondary | ICD-10-CM | POA: Diagnosis not present

## 2016-02-04 DIAGNOSIS — I651 Occlusion and stenosis of basilar artery: Principal | ICD-10-CM | POA: Diagnosis present

## 2016-02-04 HISTORY — PX: RADIOLOGY WITH ANESTHESIA: SHX6223

## 2016-02-04 LAB — CBC WITH DIFFERENTIAL/PLATELET
Basophils Absolute: 0 10*3/uL (ref 0.0–0.1)
Basophils Relative: 0 %
Eosinophils Absolute: 0.2 10*3/uL (ref 0.0–0.7)
Eosinophils Relative: 2 %
HEMATOCRIT: 41.3 % (ref 39.0–52.0)
HEMOGLOBIN: 13.4 g/dL (ref 13.0–17.0)
LYMPHS ABS: 2.2 10*3/uL (ref 0.7–4.0)
LYMPHS PCT: 22 %
MCH: 28 pg (ref 26.0–34.0)
MCHC: 32.4 g/dL (ref 30.0–36.0)
MCV: 86.4 fL (ref 78.0–100.0)
MONO ABS: 0.8 10*3/uL (ref 0.1–1.0)
MONOS PCT: 8 %
NEUTROS ABS: 6.8 10*3/uL (ref 1.7–7.7)
Neutrophils Relative %: 68 %
Platelets: 299 10*3/uL (ref 150–400)
RBC: 4.78 MIL/uL (ref 4.22–5.81)
RDW: 13.6 % (ref 11.5–15.5)
WBC: 10 10*3/uL (ref 4.0–10.5)

## 2016-02-04 LAB — BASIC METABOLIC PANEL
ANION GAP: 11 (ref 5–15)
BUN: 20 mg/dL (ref 6–20)
CALCIUM: 9.8 mg/dL (ref 8.9–10.3)
CHLORIDE: 103 mmol/L (ref 101–111)
CO2: 22 mmol/L (ref 22–32)
Creatinine, Ser: 1.07 mg/dL (ref 0.61–1.24)
GFR calc Af Amer: >60 mL/min (ref >60)
GFR calc non Af Amer: >60 mL/min (ref >60)
GLUCOSE: 191 mg/dL — AB (ref 65–99)
POTASSIUM: 3.8 mmol/L (ref 3.5–5.1)
Sodium: 136 mmol/L (ref 135–145)

## 2016-02-04 LAB — HEPARIN LEVEL (UNFRACTIONATED): Heparin Unfractionated: <0.1 IU/mL — ABNORMAL LOW (ref 0.30–0.70)

## 2016-02-04 LAB — PROTIME-INR
INR: 1.01 (ref 0.00–1.49)
Prothrombin Time: 13.5 seconds (ref 11.6–15.2)

## 2016-02-04 LAB — GLUCOSE, CAPILLARY
GLUCOSE-CAPILLARY: 171 mg/dL — AB (ref 65–99)
GLUCOSE-CAPILLARY: 183 mg/dL — AB (ref 65–99)
Glucose-Capillary: 184 mg/dL — ABNORMAL HIGH (ref 65–99)
Glucose-Capillary: 226 mg/dL — ABNORMAL HIGH (ref 65–99)

## 2016-02-04 LAB — PLATELET INHIBITION P2Y12: Platelet Function  P2Y12: 79 [PRU] — ABNORMAL LOW (ref 194–418)

## 2016-02-04 LAB — POCT ACTIVATED CLOTTING TIME
ACTIVATED CLOTTING TIME: 158 s
ACTIVATED CLOTTING TIME: 153 s
Activated Clotting Time: 164 seconds

## 2016-02-04 LAB — APTT: aPTT: 28 seconds (ref 24–37)

## 2016-02-04 SURGERY — RADIOLOGY WITH ANESTHESIA
Anesthesia: General

## 2016-02-04 MED ORDER — ACETAMINOPHEN 500 MG PO TABS: 1000.0000 mg | ORAL_TABLET | Freq: Four times a day (QID) | ORAL | Status: DC | PRN

## 2016-02-04 MED ORDER — ACETAMINOPHEN 650 MG RE SUPP: 650.0000 mg | Freq: Four times a day (QID) | RECTAL | Status: DC | PRN

## 2016-02-04 MED ORDER — HEPARIN (PORCINE) IN NACL 100-0.45 UNIT/ML-% IJ SOLN
950.0000 [IU]/h | INTRAMUSCULAR | Status: AC
  Filled 2016-02-04: qty 250

## 2016-02-04 MED ORDER — PHENYLEPHRINE HCL 10 MG/ML IJ SOLN
10.0000 mg | INTRAVENOUS | Status: DC | PRN
  Administered 2016-02-04: 40 ug/min via INTRAVENOUS

## 2016-02-04 MED ORDER — SODIUM CHLORIDE 0.9 % IV SOLN
INTRAVENOUS | Status: DC
  Administered 2016-02-05: 02:00:00 via INTRAVENOUS

## 2016-02-04 MED ORDER — ONDANSETRON HCL 4 MG/2ML IJ SOLN
INTRAMUSCULAR | Status: DC | PRN
  Administered 2016-02-04: 4 mg via INTRAVENOUS

## 2016-02-04 MED ORDER — ROCURONIUM BROMIDE 100 MG/10ML IV SOLN
INTRAVENOUS | Status: DC | PRN
  Administered 2016-02-04: 5 mg via INTRAVENOUS
  Administered 2016-02-04: 25 mg via INTRAVENOUS
  Administered 2016-02-04: 15 mg via INTRAVENOUS

## 2016-02-04 MED ORDER — MEPERIDINE HCL 25 MG/ML IJ SOLN: 6.2500 mg | INTRAMUSCULAR | Status: DC | PRN

## 2016-02-04 MED ORDER — ASPIRIN EC 325 MG PO TBEC: 325.0000 mg | DELAYED_RELEASE_TABLET | ORAL | Status: DC

## 2016-02-04 MED ORDER — IOPAMIDOL (ISOVUE-300) INJECTION 61%
INTRAVENOUS | Status: AC
  Administered 2016-02-04: 40 mL
  Filled 2016-02-04: qty 150

## 2016-02-04 MED ORDER — HYDRALAZINE HCL 20 MG/ML IJ SOLN
INTRAMUSCULAR | Status: DC | PRN
  Administered 2016-02-04 (×2): 10 mg via INTRAVENOUS

## 2016-02-04 MED ORDER — SUCCINYLCHOLINE CHLORIDE 20 MG/ML IJ SOLN
INTRAMUSCULAR | Status: DC | PRN
  Administered 2016-02-04: 120 mg via INTRAVENOUS

## 2016-02-04 MED ORDER — PROMETHAZINE HCL 25 MG/ML IJ SOLN: 6.2500 mg | INTRAMUSCULAR | Status: DC | PRN

## 2016-02-04 MED ORDER — CLOPIDOGREL BISULFATE 75 MG PO TABS: 75.0000 mg | ORAL_TABLET | ORAL | Status: DC

## 2016-02-04 MED ORDER — INSULIN ASPART 100 UNIT/ML ~~LOC~~ SOLN
0.0000 [IU] | Freq: Three times a day (TID) | SUBCUTANEOUS | Status: DC
  Administered 2016-02-04: 3 [IU] via SUBCUTANEOUS

## 2016-02-04 MED ORDER — SODIUM CHLORIDE 0.9 % IV SOLN: INTRAVENOUS | Status: DC

## 2016-02-04 MED ORDER — LACTATED RINGERS IV SOLN
INTRAVENOUS | Status: DC | PRN
  Administered 2016-02-04 (×2): via INTRAVENOUS

## 2016-02-04 MED ORDER — FENTANYL CITRATE (PF) 100 MCG/2ML IJ SOLN
INTRAMUSCULAR | Status: DC | PRN
  Administered 2016-02-04: 50 ug via INTRAVENOUS
  Administered 2016-02-04: 100 ug via INTRAVENOUS
  Administered 2016-02-04: 50 ug via INTRAVENOUS

## 2016-02-04 MED ORDER — ONDANSETRON HCL 4 MG/2ML IJ SOLN: 4.0000 mg | Freq: Four times a day (QID) | INTRAMUSCULAR | Status: DC | PRN

## 2016-02-04 MED ORDER — NITROGLYCERIN 1 MG/10 ML FOR IR/CATH LAB
INTRA_ARTERIAL | Status: AC
  Filled 2016-02-04: qty 10

## 2016-02-04 MED ORDER — HYDROMORPHONE HCL 1 MG/ML IJ SOLN: 0.2500 mg | INTRAMUSCULAR | Status: DC | PRN

## 2016-02-04 MED ORDER — INSULIN ASPART 100 UNIT/ML ~~LOC~~ SOLN
0.0000 [IU] | Freq: Three times a day (TID) | SUBCUTANEOUS | Status: DC
  Administered 2016-02-05 (×2): 8 [IU] via SUBCUTANEOUS

## 2016-02-04 MED ORDER — ASPIRIN 325 MG PO TABS
325.0000 mg | ORAL_TABLET | Freq: Every day | ORAL | Status: DC
  Administered 2016-02-05: 325 mg via ORAL
  Filled 2016-02-04: qty 1

## 2016-02-04 MED ORDER — NIMODIPINE 30 MG PO CAPS: 0.0000 mg | ORAL_CAPSULE | ORAL | Status: DC

## 2016-02-04 MED ORDER — CLOPIDOGREL BISULFATE 75 MG PO TABS
75.0000 mg | ORAL_TABLET | Freq: Every day | ORAL | Status: DC
  Administered 2016-02-05: 75 mg via ORAL
  Filled 2016-02-04: qty 1

## 2016-02-04 MED ORDER — PROPOFOL 10 MG/ML IV BOLUS
INTRAVENOUS | Status: DC | PRN
  Administered 2016-02-04: 200 mg via INTRAVENOUS

## 2016-02-04 MED ORDER — CEFAZOLIN SODIUM-DEXTROSE 2-4 GM/100ML-% IV SOLN
INTRAVENOUS | Status: AC
  Filled 2016-02-04: qty 100

## 2016-02-04 MED ORDER — CEFAZOLIN SODIUM-DEXTROSE 2-4 GM/100ML-% IV SOLN
2.0000 g | INTRAVENOUS | Status: AC
  Administered 2016-02-04: 2 g via INTRAVENOUS

## 2016-02-04 MED ORDER — PHENYLEPHRINE HCL 10 MG/ML IJ SOLN
INTRAMUSCULAR | Status: DC | PRN
  Administered 2016-02-04: 40 ug via INTRAVENOUS
  Administered 2016-02-04: 80 ug via INTRAVENOUS

## 2016-02-04 MED ORDER — LIDOCAINE HCL (CARDIAC) 20 MG/ML IV SOLN
INTRAVENOUS | Status: DC | PRN
  Administered 2016-02-04: 100 mg via INTRAVENOUS

## 2016-02-04 MED ORDER — LABETALOL HCL 5 MG/ML IV SOLN
INTRAVENOUS | Status: DC | PRN
  Administered 2016-02-04 (×2): 10 mg via INTRAVENOUS

## 2016-02-04 MED ORDER — NITROGLYCERIN 0.2 MG/ML ON CALL CATH LAB
INTRAVENOUS | Status: DC | PRN
  Administered 2016-02-04: 40 ug via INTRAVENOUS
  Administered 2016-02-04 (×2): 80 ug via INTRAVENOUS
  Administered 2016-02-04: 120 ug via INTRAVENOUS
  Administered 2016-02-04: 80 ug via INTRAVENOUS
  Administered 2016-02-04: 120 ug via INTRAVENOUS

## 2016-02-04 MED ORDER — HEPARIN SODIUM (PORCINE) 1000 UNIT/ML IJ SOLN
INTRAMUSCULAR | Status: DC | PRN
  Administered 2016-02-04 (×3): 500 [IU] via INTRAVENOUS
  Administered 2016-02-04: 3000 [IU] via INTRAVENOUS

## 2016-02-04 MED ORDER — NICARDIPINE HCL IN NACL 20-0.86 MG/200ML-% IV SOLN
5.0000 mg/h | INTRAVENOUS | Status: DC
  Filled 2016-02-04 (×2): qty 200

## 2016-02-04 MED ORDER — SUGAMMADEX SODIUM 200 MG/2ML IV SOLN
INTRAVENOUS | Status: DC | PRN
  Administered 2016-02-04: 200 mg via INTRAVENOUS

## 2016-02-04 MED ORDER — INSULIN ASPART 100 UNIT/ML ~~LOC~~ SOLN
0.0000 [IU] | Freq: Once | SUBCUTANEOUS | Status: AC
  Administered 2016-02-04: 2 [IU] via SUBCUTANEOUS

## 2016-02-04 MED ORDER — MIDAZOLAM HCL 5 MG/5ML IJ SOLN
INTRAMUSCULAR | Status: DC | PRN
  Administered 2016-02-04 (×2): 1 mg via INTRAVENOUS

## 2016-02-04 MED ORDER — HEPARIN (PORCINE) IN NACL 100-0.45 UNIT/ML-% IJ SOLN
800.0000 [IU]/h | INTRAMUSCULAR | Status: DC
  Administered 2016-02-04: 800 [IU]/h via INTRAVENOUS
  Filled 2016-02-04 (×2): qty 250

## 2016-02-04 MED ORDER — EPTIFIBATIDE 20 MG/10ML IV SOLN
INTRAVENOUS | Status: AC
  Filled 2016-02-04: qty 10

## 2016-02-04 MED ORDER — IOPAMIDOL (ISOVUE-300) INJECTION 61%
INTRAVENOUS | Status: AC
  Administered 2016-02-04: 60 mL
  Filled 2016-02-04: qty 150

## 2016-02-04 NOTE — Progress Notes (Signed)
   02/04/16 0715  OBSTRUCTIVE SLEEP APNEA  Have you ever been diagnosed with sleep apnea through a sleep study? No  Do you snore loudly (loud enough to be heard through closed doors)?  0  Do you often feel tired, fatigued, or sleepy during the daytime (such as falling asleep during driving or talking to someone)? 0  Has anyone observed you stop breathing during your sleep? 1  Do you have, or are you being treated for high blood pressure? 1  BMI more than 35 kg/m2? 0  Age > 50 (1-yes) 1  Neck circumference greater than:Male 16 inches or larger, Male 17inches or larger? 1  Male Gender (Yes=1) 1  Obstructive Sleep Apnea Score 5  Score 5 or greater  Results sent to PCP
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
## 2016-02-04 NOTE — Anesthesia Postprocedure Evaluation (Signed)
Anesthesia Post Note  Patient: [name]  Procedure(s) Performed: Procedure(s) (LRB): Basilar artery angioplasty with stenting (N/A)  Patient location during evaluation: PACU Anesthesia Type: General Level of consciousness: sedated Pain management: pain level controlled Vital Signs Assessment: post-procedure vital signs reviewed and stable Respiratory status: spontaneous breathing Cardiovascular status: stable Postop Assessment: no signs of nausea or vomiting Anesthetic complications: no     Last Vitals:  Filed Vitals:   02/04/16 1215 02/04/16 1230  BP: 101/60 109/59  Pulse: 80 81  Temp:    Resp: 13 16    Last Pain:  Filed Vitals:   02/04/16 1244  PainSc: 0-No pain   Pain Goal: Patients Stated Pain Goal: 2 (02/04/16 0718)  LLE Motor Response: Purposeful movement;Responds to commands (02/04/16 1230)   RLE Motor Response: Purposeful movement;Responds to commands (02/04/16 1230)        [place]

## 2016-02-04 NOTE — Progress Notes (Signed)
Patient ID: [name], male   DOB: 06-12-1956, 60 y.o.   MRN: [id number]    Referring Physician(s): Jaynee Eagles  Supervising Physician: Luanne Bras  Patient Status: inpt  Chief Complaint: S/p basilar artery angioplasty and stenting  Subjective: Patient feels well.  No complaints.  Hungry.  Allergies: Review of patient's allergies indicates no known allergies.  Medications: Prior to Admission medications   Medication Sig Start Date End Date Taking? Authorizing Provider  aspirin 325 MG tablet Take 325 mg by mouth daily.   Yes Historical Provider, MD  aspirin EC 81 MG tablet Take 1 tablet (81 mg total) by mouth daily. 01/31/16  Yes Modena Jansky, MD  atorvastatin (LIPITOR) 80 MG tablet Take 1 tablet (80 mg total) by mouth daily. 10/12/15  Yes Nishant Dhungel, MD  clopidogrel (PLAVIX) 75 MG tablet TAKE 1 TABLET BY MOUTH EVERY DAY 09/20/15  Yes Lorretta Harp, MD  fenofibrate (TRICOR) 145 MG tablet Take 1 tablet (145 mg total) by mouth daily. 01/24/15  Yes Lorretta Harp, MD  gabapentin (NEURONTIN) 300 MG capsule Take 300-600 mg by mouth 2 (two) times daily. Take 600mg  in the morning and 300mg  in the evening.   Yes Historical Provider, MD  insulin aspart (NOVOLOG) 100 UNIT/ML injection Inject 40 Units into the skin 3 (three) times daily before meals. Patient taking differently: Inject 35 Units into the skin 3 (three) times daily before meals.  10/12/15  Yes Nishant Dhungel, MD  LEVEMIR FLEXTOUCH 100 UNIT/ML Pen Inject 80 Units into the skin 2 (two) times daily. Reported on 08/21/2015 Patient taking differently: Inject 75 Units into the skin 2 (two) times daily. Reported on 08/21/2015 10/12/15  Yes Nishant Dhungel, MD  lisinopril-hydrochlorothiazide (PRINZIDE,ZESTORETIC) 20-12.5 MG per tablet Take 1 tablet by mouth daily with breakfast.    Yes Historical Provider, MD  metFORMIN (GLUCOPHAGE-XR) 500 MG 24 hr tablet Take 1 tablet (500 mg total) by mouth 2 (two) times daily. 02/01/16  Yes Modena Jansky, MD  metoprolol succinate (TOPROL-XL) 25 MG 24 hr tablet TAKE 1 TABLET (25 MG TOTAL) BY MOUTH DAILY. 11/18/15  Yes Lorretta Harp, MD    Vital Signs: BP 114/59 mmHg  Pulse 68  Temp(Src) 97.8 F (36.6 C)  Resp 15  Ht 5\' 11"  (1.803 m)  Wt 246 lb 4.1 oz (111.7 kg)  BMI 34.36 kg/m2  SpO2 98%  Physical Exam: Neuro: Neuro exam is normal except decreased fine motor of LUE along with zig zagging of his LUE with finger to nose movements.  Imaging: No results found.  Labs:  CBC:  Recent Labs  01/29/16 0540 01/30/16 0425 01/31/16 1603 02/04/16 0710  WBC 9.4 10.4 8.8 10.0  HGB 12.9* 12.6* 13.2 13.4  HCT 39.9 38.8* 39.9 41.3  PLT 271 289 308 299    COAGS:  Recent Labs  09/04/15 1530 10/10/15 1055 01/30/16 0425 02/04/16 0710  INR 0.95 0.90 1.05 1.01  APTT 27 28 34 28    BMP:  Recent Labs  01/28/16 1951 01/29/16 0540 01/30/16 0425 02/04/16 0710  NA 133* 136 135 136  K 3.9 4.4 4.1 3.8  CL 100* 102 102 103  CO2 23 25 26 22   GLUCOSE 390* 357* 251* 191*  BUN 16 17 19 20   CALCIUM 9.8 9.4 9.3 9.8  CREATININE 0.95 0.95 0.88 1.07  GFRNONAA >60 >60 >60 >60  GFRAA >60 >60 >60 >60    LIVER FUNCTION TESTS:  Recent Labs  09/04/15 1530 10/10/15 1055 01/29/16 0540  BILITOT 0.3 0.7 0.7  AST 15 22 17   ALT 17 18 19   ALKPHOS 59 69 74  PROT 6.5 6.8 6.5  ALBUMIN 3.5 3.4* 3.4*    Assessment and Plan: 1. S/p angiogram with angioplasty and stenting of the basilar artery -doing well. -start on a carb mod diet -SSI for DM -cont heparin drip while here -cont plavix and ASA -check P2Y12 in am -hopefully home in am.  Electronically Signed: Lataysha Vohra E 02/04/2016, 3:38 PM   I spent a total of 15 Minutes at the the patient's bedside AND on the patient's hospital floor or unit, greater than 50% of which was counseling/coordinating care for basilar artery stenosis

## 2016-02-04 NOTE — Transfer of Care (Signed)
Immediate Anesthesia Transfer of Care Note  Patient: [name]  Procedure(s) Performed: Procedure(s): Basilar artery angioplasty with stenting (N/A)  Patient Location: PACU  Anesthesia Type:General  Level of Consciousness: awake, alert  and patient cooperative  Airway & Oxygen Therapy: Patient Spontanous Breathing and Patient connected to nasal cannula oxygen  Post-op Assessment: Report given to RN, Post -op Vital signs reviewed and stable, Patient moving all extremities X 4 and Patient able to stick tongue midline  Post vital signs: Reviewed and stable  Last Vitals:  Filed Vitals:   02/04/16 0723  BP: 113/63    Last Pain: There were no vitals filed for this visit.    Patients Stated Pain Goal: 2 ([id number])  Complications: No apparent anesthesia complications

## 2016-02-04 NOTE — Progress Notes (Signed)
BP 113/63 Nimotop held as ordered.

## 2016-02-04 NOTE — Anesthesia Preprocedure Evaluation (Signed)
Anesthesia Evaluation  Patient identified by MRN, date of birth, ID band Patient awake    Reviewed: Allergy & Precautions, NPO status , Patient's Chart, lab work & pertinent test results  Airway Mallampati: II  TM Distance: >3 FB Neck ROM: Full    Dental  (+) Missing, Dental Advisory Given   Pulmonary    Pulmonary exam normal breath sounds clear to auscultation       Cardiovascular hypertension, Pt. on medications + CAD, + Past MI and + Peripheral Vascular Disease  Normal cardiovascular exam Rhythm:Regular Rate:Normal     Neuro/Psych    GI/Hepatic   Endo/Other  diabetes, Poorly Controlled, Type 2  Renal/GU      Musculoskeletal   Abdominal (+) + obese,   Peds  Hematology   Anesthesia Other Findings   Reproductive/Obstetrics                             Anesthesia Physical  Anesthesia Plan  ASA: III  Anesthesia Plan: General   Post-op Pain Management:    Induction: Intravenous  Airway Management Planned: Oral ETT  Additional Equipment: Arterial line  Intra-op Plan:   Post-operative Plan: Extubation in OR  Informed Consent: I have reviewed the patients History and Physical, chart, labs and discussed the procedure including the risks, benefits and alternatives for the proposed anesthesia with the patient or authorized representative who has indicated his/her understanding and acceptance.   Dental advisory given  Plan Discussed with: CRNA and Surgeon  Anesthesia Plan Comments: (Aline, 2x piv)        Anesthesia Quick Evaluation                                  Anesthesia Evaluation  Patient identified by MRN, date of birth, ID band Patient awake    Reviewed: Allergy & Precautions, H&P , NPO status , Patient's Chart, lab work & pertinent test results  Airway Mallampati: III TM Distance: <3 FB Neck ROM: Full    Dental No notable dental hx.    Pulmonary neg  pulmonary ROS,  breath sounds clear to auscultation  + decreased breath sounds      Cardiovascular hypertension, + CAD, + Past MI, + CABG and + Peripheral Vascular Disease Rhythm:Regular Rate:Normal     Neuro/Psych negative neurological ROS  negative psych ROS   GI/Hepatic negative GI ROS, Neg liver ROS,   Endo/Other  diabetes, Type 1, Insulin Dependent  Renal/GU negative Renal ROS  negative genitourinary   Musculoskeletal negative musculoskeletal ROS (+)   Abdominal   Peds negative pediatric ROS (+)  Hematology negative hematology ROS (+)   Anesthesia Other Findings   Reproductive/Obstetrics negative OB ROS                           Anesthesia Physical Anesthesia Plan  ASA: III  Anesthesia Plan: General   Post-op Pain Management:    Induction: Intravenous  Airway Management Planned: Oral ETT and Video Laryngoscope Planned  Additional Equipment:   Intra-op Plan:   Post-operative Plan: Extubation in OR  Informed Consent: I have reviewed the patients History and Physical, chart, labs and discussed the procedure including the risks, benefits and alternatives for the proposed anesthesia with the patient or authorized representative who has indicated his/her understanding and acceptance.   Dental advisory given  Plan Discussed with: CRNA and  Surgeon  Anesthesia Plan Comments:        Anesthesia Quick Evaluation                                   Anesthesia Evaluation  Patient identified by MRN, date of birth, ID band Patient awake    Reviewed: Allergy & Precautions, H&P , NPO status , Patient's Chart, lab work & pertinent test results  Airway Mallampati: III TM Distance: <3 FB Neck ROM: Full    Dental no notable dental hx.    Pulmonary neg pulmonary ROS,  breath sounds clear to auscultation  + decreased breath sounds      Cardiovascular hypertension, + CAD, + Past MI, + CABG and + Peripheral Vascular  Disease Rhythm:Regular Rate:Normal     Neuro/Psych negative neurological ROS  negative psych ROS   GI/Hepatic negative GI ROS, Neg liver ROS,   Endo/Other  diabetes, Type 1, Insulin Dependent  Renal/GU negative Renal ROS  negative genitourinary   Musculoskeletal negative musculoskeletal ROS (+)   Abdominal   Peds negative pediatric ROS (+)  Hematology negative hematology ROS (+)   Anesthesia Other Findings   Reproductive/Obstetrics negative OB ROS                           Anesthesia Physical  Anesthesia Plan  ASA: III  Anesthesia Plan: General   Post-op Pain Management:    Induction: Intravenous  Airway Management Planned: Oral ETT and Video Laryngoscope Planned  Additional Equipment:   Intra-op Plan:   Post-operative Plan: Extubation in OR  Informed Consent: I have reviewed the patients History and Physical, chart, labs and discussed the procedure including the risks, benefits and alternatives for the proposed anesthesia with the patient or authorized representative who has indicated his/her understanding and acceptance.   Dental advisory given  Plan Discussed with: CRNA and Surgeon  Anesthesia Plan Comments: (SCB)        Anesthesia Quick Evaluation

## 2016-02-04 NOTE — Anesthesia Procedure Notes (Signed)
Procedure Name: Intubation Date/Time: 02/04/2016 8:52 AM Performed by: Rejeana Brock L Pre-anesthesia Checklist: Patient identified, Emergency Drugs available, Suction available, Patient being monitored and Timeout performed Patient Re-evaluated:Patient Re-evaluated prior to inductionOxygen Delivery Method: Circle system utilized Preoxygenation: Pre-oxygenation with 100% oxygen Intubation Type: IV induction Ventilation: Mask ventilation without difficulty and Oral airway inserted - appropriate to patient size Laryngoscope Size: Mac and 4 Grade View: Grade I Tube type: Oral Tube size: 7.5 mm Number of attempts: 1 Airway Equipment and Method: Stylet Placement Confirmation: ETT inserted through vocal cords under direct vision,  positive ETCO2 and breath sounds checked- equal and bilateral Secured at: 22 cm Tube secured with: Tape Dental Injury: Teeth and Oropharynx as per pre-operative assessment

## 2016-02-04 NOTE — Progress Notes (Addendum)
ANTICOAGULATION CONSULT NOTE - FOLLOW UP    HL = < 0.1 (goal 0.1 - 0.25 units/mL) Heparin dosing weight = 98kg   Assessment: 59 YOM continues on IV heparin s/p left vertebral arteriogram followed by stent assisted angioplasty.  Heparin level is undetectable.  No bleeding reported.   Plan: - Increase heparin gtt to 950 units/hr - D/C tomorrow at 0700 per protocol   Sofija Antwi D. Mina Marble, PharmD, BCPS 02/04/2016, 7:50 PM

## 2016-02-04 NOTE — Procedures (Signed)
S/P Lt vertebral arteriogram,followed by stent assisted angioplasty of symptomaiic severe stenosis of mid basilar artery

## 2016-02-05 ENCOUNTER — Encounter (HOSPITAL_COMMUNITY): Payer: Self-pay | Admitting: Interventional Radiology

## 2016-02-05 ENCOUNTER — Other Ambulatory Visit: Payer: Self-pay | Admitting: Neurology

## 2016-02-05 DIAGNOSIS — I6350 Cerebral infarction due to unspecified occlusion or stenosis of unspecified cerebral artery: Secondary | ICD-10-CM

## 2016-02-05 DIAGNOSIS — E785 Hyperlipidemia, unspecified: Secondary | ICD-10-CM

## 2016-02-05 DIAGNOSIS — I639 Cerebral infarction, unspecified: Secondary | ICD-10-CM

## 2016-02-05 DIAGNOSIS — E1159 Type 2 diabetes mellitus with other circulatory complications: Secondary | ICD-10-CM

## 2016-02-05 DIAGNOSIS — Z8673 Personal history of transient ischemic attack (TIA), and cerebral infarction without residual deficits: Secondary | ICD-10-CM

## 2016-02-05 LAB — PLATELET INHIBITION P2Y12: PLATELET FUNCTION P2Y12: 228 [PRU] (ref 194–418)

## 2016-02-05 LAB — GLUCOSE, CAPILLARY
Glucose-Capillary: 290 mg/dL — ABNORMAL HIGH (ref 65–99)
Glucose-Capillary: 272 mg/dL — ABNORMAL HIGH (ref 65–99)

## 2016-02-05 LAB — BASIC METABOLIC PANEL
ANION GAP: 8 (ref 5–15)
BUN: 14 mg/dL (ref 6–20)
CALCIUM: 8.8 mg/dL — AB (ref 8.9–10.3)
CO2: 24 mmol/L (ref 22–32)
CREATININE: 0.86 mg/dL (ref 0.61–1.24)
Chloride: 105 mmol/L (ref 101–111)
GFR calc Af Amer: >60 mL/min (ref >60)
GLUCOSE: 267 mg/dL — AB (ref 65–99)
Potassium: 4.2 mmol/L (ref 3.5–5.1)
Sodium: 137 mmol/L (ref 135–145)

## 2016-02-05 LAB — CBC WITH DIFFERENTIAL/PLATELET
BASOS ABS: 0 10*3/uL (ref 0.0–0.1)
BASOS PCT: 0 %
EOS ABS: 0.3 10*3/uL (ref 0.0–0.7)
EOS PCT: 3 %
HCT: 33.5 % — ABNORMAL LOW (ref 39.0–52.0)
Hemoglobin: 10.8 g/dL — ABNORMAL LOW (ref 13.0–17.0)
Lymphocytes Relative: 23 %
Lymphs Abs: 2 10*3/uL (ref 0.7–4.0)
MCH: 28.1 pg (ref 26.0–34.0)
MCHC: 32.2 g/dL (ref 30.0–36.0)
MCV: 87.2 fL (ref 78.0–100.0)
MONO ABS: 0.7 10*3/uL (ref 0.1–1.0)
MONOS PCT: 8 %
NEUTROS ABS: 5.6 10*3/uL (ref 1.7–7.7)
Neutrophils Relative %: 66 %
PLATELETS: 218 10*3/uL (ref 150–400)
RBC: 3.84 MIL/uL — ABNORMAL LOW (ref 4.22–5.81)
RDW: 13.9 % (ref 11.5–15.5)
WBC: 8.6 10*3/uL (ref 4.0–10.5)

## 2016-02-05 LAB — HEPARIN LEVEL (UNFRACTIONATED)

## 2016-02-05 MED ORDER — METOPROLOL SUCCINATE ER 25 MG PO TB24
25.0000 mg | ORAL_TABLET | Freq: Every day | ORAL | Status: DC
  Administered 2016-02-05: 25 mg via ORAL
  Filled 2016-02-05: qty 1

## 2016-02-05 MED ORDER — CLOPIDOGREL BISULFATE 75 MG PO TABS
75.0000 mg | ORAL_TABLET | Freq: Once | ORAL | Status: AC
  Administered 2016-02-05: 75 mg via ORAL
  Filled 2016-02-05: qty 1

## 2016-02-05 MED ORDER — LISINOPRIL-HYDROCHLOROTHIAZIDE 20-12.5 MG PO TABS: 1.0000 | ORAL_TABLET | Freq: Every day | ORAL | Status: DC

## 2016-02-05 MED ORDER — LISINOPRIL 20 MG PO TABS
20.0000 mg | ORAL_TABLET | Freq: Every day | ORAL | Status: DC
  Administered 2016-02-05: 20 mg via ORAL
  Filled 2016-02-05: qty 1

## 2016-02-05 MED ORDER — APIXABAN 5 MG PO TABS: 5.0000 mg | ORAL_TABLET | Freq: Two times a day (BID) | ORAL | Status: DC

## 2016-02-05 MED ORDER — INSULIN DETEMIR 100 UNIT/ML ~~LOC~~ SOLN
75.0000 [IU] | Freq: Two times a day (BID) | SUBCUTANEOUS | Status: DC
  Administered 2016-02-05: 75 [IU] via SUBCUTANEOUS
  Filled 2016-02-05 (×2): qty 0.75

## 2016-02-05 MED ORDER — INSULIN ASPART 100 UNIT/ML ~~LOC~~ SOLN
35.0000 [IU] | Freq: Three times a day (TID) | SUBCUTANEOUS | Status: DC
  Administered 2016-02-05: 35 [IU] via SUBCUTANEOUS

## 2016-02-05 MED ORDER — GABAPENTIN 300 MG PO CAPS
600.0000 mg | ORAL_CAPSULE | Freq: Every day | ORAL | Status: DC
  Administered 2016-02-05: 600 mg via ORAL
  Filled 2016-02-05: qty 2

## 2016-02-05 MED ORDER — HYDROCHLOROTHIAZIDE 12.5 MG PO CAPS
12.5000 mg | ORAL_CAPSULE | Freq: Every day | ORAL | Status: DC
  Administered 2016-02-05: 12.5 mg via ORAL
  Filled 2016-02-05: qty 1

## 2016-02-05 MED ORDER — FENOFIBRATE 54 MG PO TABS
54.0000 mg | ORAL_TABLET | Freq: Every day | ORAL | Status: DC
  Administered 2016-02-05: 54 mg via ORAL
  Filled 2016-02-05: qty 1

## 2016-02-05 MED ORDER — GABAPENTIN 300 MG PO CAPS: 300.0000 mg | ORAL_CAPSULE | Freq: Every day | ORAL | Status: DC

## 2016-02-05 MED ORDER — ATORVASTATIN CALCIUM 80 MG PO TABS
80.0000 mg | ORAL_TABLET | Freq: Every day | ORAL | Status: DC
  Administered 2016-02-05: 80 mg via ORAL
  Filled 2016-02-05: qty 1

## 2016-02-05 NOTE — Discharge Summary (Signed)
Patient ID: [name] MRN: [id number] DOB/AGE: December 28, 1955 60 y.o.  Admit date: 02/04/2016 Discharge date: 02/05/2016  Supervising Physician: Luanne Bras  Admission Diagnoses: Basilar artery stenosis/CVA  Discharge Diagnoses:  Active Problems:   Basilar artery stenosis, infarct within 8 weeks [facility])   Discharged Condition: improved  Hospital Course: Hx CVA March and July 2017 Basilar artery stenosis per imaging Basilar artery angioplasty/stent placed in IR with Dr Estanislado Pandy 02/14/16 Tolerated procedure well; no complications Overnight stay in Neuro ICU without event Dr Estanislado Pandy has seen and evaluated pt. Eating well; slept well Denies headache; N/V Denies numbness; tingling; visual changes Speech still slightly slurred; maybe some better than prior to procedure UOP great VSS Neurology has seen pt---Recommendations: change ASA to Eliquis; Continue Plavix daily Plan for discharge home today To see Dr Estanislado Pandy in follow up 2 weeks  Consults: Neurology  Significant Diagnostic Studies: Cerebral arteriogram  Treatments:  Procedures    CEREBRAL ANGIOGRAM [id number] (Custom)]      Expand All Collapse All   S/P Lt vertebral arteriogram,followed by stent assisted angioplasty of symptomaiic severe stenosis of mid basilar artery       Discharge Exam: Blood pressure 139/92, pulse 67, temperature 98.6 F (37 C), temperature source Oral, resp. rate 13, height 5\' 11"  (1.803 m), weight 246 lb 4.1 oz (111.7 kg), SpO2 98 %.  PE: A/O Pleasant EOM Tongue midline Face symmetrical Speech minimally slurred--- as pre procedure Finger to nose normal with Rt; slower with left but improved Heart: RRR Lungs: CTA Abd: soft; +BS; NT Rt groin: NT no bleeding; no hematoma Rt foot 2+ pulses Extr: FROM Good strength and sensation B UOP good; yellow  Results for orders placed or performed during the hospital encounter of 02/04/16  APTT  Result Value Ref Range   aPTT 28  24 - 37 seconds  Basic metabolic panel  Result Value Ref Range   Sodium 136 135 - 145 mmol/L   Potassium 3.8 3.5 - 5.1 mmol/L   Chloride 103 101 - 111 mmol/L   CO2 22 22 - 32 mmol/L   Glucose, Bld 191 (H) 65 - 99 mg/dL   BUN 20 6 - 20 mg/dL   Creatinine, Ser 1.07 0.61 - 1.24 mg/dL   Calcium 9.8 8.9 - 10.3 mg/dL   GFR calc non Af Amer >60 >60 mL/min   GFR calc Af Amer >60 >60 mL/min   Anion gap 11 5 - 15  CBC WITH DIFFERENTIAL  Result Value Ref Range   WBC 10.0 4.0 - 10.5 K/uL   RBC 4.78 4.22 - 5.81 MIL/uL   Hemoglobin 13.4 13.0 - 17.0 g/dL   HCT 41.3 39.0 - 52.0 %   MCV 86.4 78.0 - 100.0 fL   MCH 28.0 26.0 - 34.0 pg   MCHC 32.4 30.0 - 36.0 g/dL   RDW 13.6 11.5 - 15.5 %   Platelets 299 150 - 400 K/uL   Neutrophils Relative % 68 %   Neutro Abs 6.8 1.7 - 7.7 K/uL   Lymphocytes Relative 22 %   Lymphs Abs 2.2 0.7 - 4.0 K/uL   Monocytes Relative 8 %   Monocytes Absolute 0.8 0.1 - 1.0 K/uL   Eosinophils Relative 2 %   Eosinophils Absolute 0.2 0.0 - 0.7 K/uL   Basophils Relative 0 %   Basophils Absolute 0.0 0.0 - 0.1 K/uL  Platelet inhibition p2y12 (not at [facility])  Result Value Ref Range   Platelet Function  P2Y12 79 (L) 194 - 418 PRU  Protime-INR  Result Value Ref Range   Prothrombin Time 13.5 11.6 - 15.2 seconds   INR 1.01 0.00 - 1.49  Glucose, capillary  Result Value Ref Range   Glucose-Capillary 184 (H) 65 - 99 mg/dL  Glucose, capillary  Result Value Ref Range   Glucose-Capillary 171 (H) 65 - 99 mg/dL   Comment 1 Notify RN    Comment 2 Document in Chart   Heparin level (unfractionated)  Result Value Ref Range   Heparin Unfractionated <0.10 (L) 0.30 - 0.70 IU/mL  Heparin level (unfractionated)  Result Value Ref Range   Heparin Unfractionated <0.10 (L) 0.30 - 0.70 IU/mL  Basic metabolic panel  Result Value Ref Range   Sodium 137 135 - 145 mmol/L   Potassium 4.2 3.5 - 5.1 mmol/L   Chloride 105 101 - 111 mmol/L   CO2 24 22 - 32 mmol/L   Glucose, Bld 267 (H) 65 - 99  mg/dL   BUN 14 6 - 20 mg/dL   Creatinine, Ser 0.86 0.61 - 1.24 mg/dL   Calcium 8.8 (L) 8.9 - 10.3 mg/dL   GFR calc non Af Amer >60 >60 mL/min   GFR calc Af Amer >60 >60 mL/min   Anion gap 8 5 - 15  CBC WITH DIFFERENTIAL  Result Value Ref Range   WBC 8.6 4.0 - 10.5 K/uL   RBC 3.84 (L) 4.22 - 5.81 MIL/uL   Hemoglobin 10.8 (L) 13.0 - 17.0 g/dL   HCT 33.5 (L) 39.0 - 52.0 %   MCV 87.2 78.0 - 100.0 fL   MCH 28.1 26.0 - 34.0 pg   MCHC 32.2 30.0 - 36.0 g/dL   RDW 13.9 11.5 - 15.5 %   Platelets 218 150 - 400 K/uL   Neutrophils Relative % 66 %   Neutro Abs 5.6 1.7 - 7.7 K/uL   Lymphocytes Relative 23 %   Lymphs Abs 2.0 0.7 - 4.0 K/uL   Monocytes Relative 8 %   Monocytes Absolute 0.7 0.1 - 1.0 K/uL   Eosinophils Relative 3 %   Eosinophils Absolute 0.3 0.0 - 0.7 K/uL   Basophils Relative 0 %   Basophils Absolute 0.0 0.0 - 0.1 K/uL  Platelet inhibition p2y12 (Not at [facility])  Result Value Ref Range   Platelet Function  P2Y12 228 194 - 418 PRU  Glucose, capillary  Result Value Ref Range   Glucose-Capillary 183 (H) 65 - 99 mg/dL  Glucose, capillary  Result Value Ref Range   Glucose-Capillary 226 (H) 65 - 99 mg/dL    Disposition: Basilar artery angioplasty/stent placed in IR with Dr Estanislado Pandy 02/04/16 Has done well overnight Plan for discharge to home Continue all home meds; restart Glucophage 7/20 am Neurology will discontinue ASA and change to Eliquis; Continue Plavix daily 2 week follow up with Dr Estanislado Pandy Pt will hear from scheduler for time and date Pt has good understanding of discharge instructions   Discharge Instructions    Call MD for:  difficulty breathing, headache or visual disturbances    Complete by:  As directed      Call MD for:  extreme fatigue    Complete by:  As directed      Call MD for:  hives    Complete by:  As directed      Call MD for:  persistant dizziness or light-headedness    Complete by:  As directed      Call MD for:  persistant nausea and  vomiting    Complete by:  As  directed      Call MD for:  redness, tenderness, or signs of infection (pain, swelling, redness, odor or green/yellow discharge around incision site)    Complete by:  As directed      Call MD for:  severe uncontrolled pain    Complete by:  As directed      Call MD for:  temperature >100.4    Complete by:  As directed      Diet - low sodium heart healthy    Complete by:  As directed      Discharge instructions    Complete by:  As directed   Follow up 2 weeks with Dr Estanislado Pandy; scheduler will call pt with time and date; call [phone] with question/concerns; resume all home meds; restart Glucophage 7/20     Discharge wound care:    Complete by:  As directed   May shower today; replace bandage with bandaid today; replace daily new band aid x 1 week     Driving Restrictions    Complete by:  As directed   No driving 2 weeks     Increase activity slowly    Complete by:  As directed      Lifting restrictions    Complete by:  As directed   No lifting over 10 lbs x 2 weeks            Medication List    TAKE these medications        aspirin 325 MG tablet  Take 325 mg by mouth daily.     aspirin EC 81 MG tablet  Take 1 tablet (81 mg total) by mouth daily.     atorvastatin 80 MG tablet  Commonly known as:  LIPITOR  Take 1 tablet (80 mg total) by mouth daily.     clopidogrel 75 MG tablet  Commonly known as:  PLAVIX  TAKE 1 TABLET BY MOUTH EVERY DAY     fenofibrate 145 MG tablet  Commonly known as:  TRICOR  Take 1 tablet (145 mg total) by mouth daily.     gabapentin 300 MG capsule  Commonly known as:  NEURONTIN  Take 300-600 mg by mouth 2 (two) times daily. Take 600mg  in the morning and 300mg  in the evening.     insulin aspart 100 UNIT/ML injection  Commonly known as:  novoLOG  Inject 40 Units into the skin 3 (three) times daily before meals.     LEVEMIR FLEXTOUCH 100 UNIT/ML Pen  Generic drug:  Insulin Detemir  Inject 80 Units into the  skin 2 (two) times daily. Reported on 08/21/2015     lisinopril-hydrochlorothiazide 20-12.5 MG tablet  Commonly known as:  PRINZIDE,ZESTORETIC  Take 1 tablet by mouth daily with breakfast.     metFORMIN 500 MG 24 hr tablet  Commonly known as:  GLUCOPHAGE-XR  Take 1 tablet (500 mg total) by mouth 2 (two) times daily.     metoprolol succinate 25 MG 24 hr tablet  Commonly known as:  TOPROL-XL  TAKE 1 TABLET (25 MG TOTAL) BY MOUTH DAILY.           Follow-up Information    Follow up with DEVESHWAR, Fritz Pickerel, MD In 2 weeks.   Specialty:  Interventional Radiology   Why:  will hear from scheduler with appt date and d=time of 2 week follow up; call [phone] if questions or concerns   Contact information:   [address] [place] [address] [phone]  Electronically Signed: Monia Sabal A 02/05/2016, 10:49 AM   I have spent Greater Than 30 Minutes discharging [place].

## 2016-02-05 NOTE — Consult Note (Signed)
STROKE TEAM CONSULT NOTE   HPI 60 yo M with hx of CAD s/p CABG, DM, HLD, PVD s/p bypass in 08/2015 had stroke in 09/2015. MRI showed right pontine infarct and MRA showed right VA occlusion and BA moderate to severe stenosis. A1C 10.3 and LDL not able to calculate. He was continued on ASA and plavix and lipitor 80 on discharge.   He was again admitted on 01/29/16 for severe dysarthria and dizziness. Found to have new diagnosed afib in ER but also MRI showed left pontine acute infarct. CTA again showed right VA chronic occlusion but this time BA stenosis progressed to high grade stenosis. He was continued with same medication on discharge but recommended BA stenting this week.   Patient admitted yesterday post BA stent placement by Dr. Estanislado Pandy. Admitted to neuro ICU. Doing well post procedure. On ASA and plavix. Due to afib, neurology was consulted for antithrombotic medication management. His P2Y12 is high this morning but in good level yesterday.   His HLD had been severe and TG near 2000 and total Chol 500s. On lipitor 80 but not effective. His DM still not in good control. He follows with Dr. Gwenlyn Found in cardiology.    Past Medical History  Diagnosis Date  . Hyperlipidemia   . CAD (coronary artery disease)     OV, Dr Harlow Asa, MYOVIEW 5/12 on chart  EKG 10/12 EPIC,  chest x ray 01/07/11 EPIC  . Peripheral vascular disease, unspecified ([place]) 03/2015    PCI to the right popliteal  . Hypertension   . Neuropathy, peripheral ([facility])     both feet  . Carpal tunnel syndrome     peripheral neuropathy  . Myocardial infarction ([place]) 02/2001  . DVT (deep venous thrombosis) ([facility])     hx LLE  . Type II diabetes mellitus ([place])   . Arthritis     "hx; cleaned it out of both shoulders"  . Skin cancer     "have had them cut or burned off my face" (03/28/2015)  . Chronic shoulder pain     "both"  . History of kidney stones   . Stroke [facility]) 10/10/2015   Social History   Social History  . Marital  Status: Widowed    Spouse Name: N/A  . Number of Children: N/A  . Years of Education: N/A   Occupational History  . Not on file.   Social History Main Topics  . Smoking status: Never Smoker   . Smokeless tobacco: Never Used  . Alcohol Use: No  . Drug Use: No  . Sexual Activity: Yes   Other Topics Concern  . Not on file   Social History Narrative    Family History  Problem Relation Age of Onset  . Cancer Mother     Breast and Brain tumor  . Cancer Father     Blood vessel tumor     OBJECTIVE Temp:  [98.2 F (36.8 C)-98.6 F (37 C)] 98.2 F (36.8 C) (07/19 1200) Pulse Rate:  [64-76] 64 (07/19 0900) Cardiac Rhythm:  [-] Normal sinus rhythm (07/19 0800) Resp:  [12-22] 17 (07/19 1200) BP: (117-151)/(54-92) 123/62 mmHg (07/19 1200) SpO2:  [96 %-100 %] 98 % (07/19 1200) Arterial Line BP: (134-179)/(59-72) 179/65 mmHg (07/19 0900)  CBC:   Recent Labs Lab 02/04/16 0710 02/05/16 0500  WBC 10.0 8.6  NEUTROABS 6.8 5.6  HGB 13.4 10.8*  HCT 41.3 33.5*  MCV 86.4 87.2  PLT 299 99991111    Basic Metabolic Panel:   Recent  Labs Lab 02/04/16 0710 02/05/16 0500  NA 136 137  K 3.8 4.2  CL 103 105  CO2 22 24  GLUCOSE 191* 267*  BUN 20 14  CREATININE 1.07 0.86  CALCIUM 9.8 8.8*    Lipid Panel:     Component Value Date/Time   CHOL 523* 01/29/2016 0538   TRIG 1925* 01/29/2016 0538   HDL NOT REPORTED DUE TO HIGH TRIGLYCERIDES 01/29/2016 0538   CHOLHDL NOT REPORTED DUE TO HIGH TRIGLYCERIDES 01/29/2016 0538   VLDL UNABLE TO CALCULATE IF TRIGLYCERIDE OVER 400 mg/dL 01/29/2016 0538   LDLCALC UNABLE TO CALCULATE IF TRIGLYCERIDE OVER 400 mg/dL 01/29/2016 0538   HgbA1c:  Lab Results  Component Value Date   HGBA1C 11.5* 01/29/2016   Urine Drug Screen:     Component Value Date/Time   LABOPIA NONE DETECTED 01/29/2016 1326   COCAINSCRNUR NONE DETECTED 01/29/2016 1326   LABBENZ NONE DETECTED 01/29/2016 1326   AMPHETMU NONE DETECTED 01/29/2016 1326   THCU NONE DETECTED  01/29/2016 1326   LABBARB NONE DETECTED 01/29/2016 1326      IMAGING I have personally reviewed the radiological images below and agree with the radiology interpretations.  [name] Head/brain Wo Cm 10/11/2015  1. Acute lacunar infarct in the right pons with no hemorrhage or mass effect.  2. Occlusion of the distal right vertebral artery proximal to PICA, suspicious for associated acute emergent large vessel occlusion. 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  3. Comparatively mild anterior circulation atherosclerosis with no significant stenosis.  4. Underlying moderately advanced chronic small vessel disease primarily in the bilateral basal ganglia and cerebral white matter.   Cerebral catheter angio 01/30/16 shows severe focal midbasilar stenosis  Ct Angio Head and neck W Or Wo Contrast  01/29/2016 IMPRESSION: Atherosclerotic plaque affecting the right carotid bifurcation. 30% stenosis of the proximal ICA. No left carotid bifurcation stenosis. 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 Left vertebral artery widely patent in the neck. 50% stenosis of the left vertebral artery at the foramen magnum. 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 30% stenosis of the right carotid siphon. Anterior and middle cerebral vessels widely patent. CT the brain does not show any new finding.    PHYSICAL EXAM  Temp:  [98.2 F (36.8 C)-98.6 F (37 C)] 98.2 F (36.8 C) (07/19 1200) Pulse Rate:  [64-76] 64 (07/19 0900) Resp:  [12-22] 17 (07/19 1200) BP: (117-151)/(54-92) 123/62 mmHg (07/19 1200) SpO2:  [96 %-100 %] 98 % (07/19 1200) Arterial Line BP: (134-179)/(59-72) 179/65 mmHg (07/19 0900)  General - Well nourished, well developed, in no apparent distress.  Ophthalmologic - Fundi not visualized due to small pupils.  Cardiovascular - Regular rate and rhythm.  Mental Status -  Level of arousal and orientation to time, place, and person were  intact. Language including expression, naming, repetition, comprehension was assessed and found intact. Fund of Knowledge was assessed and was intact.  Cranial Nerves II - XII - II - Visual field intact OU. III, IV, VI - Extraocular movements intact. V - Facial sensation intact bilaterally. VII - Facial movement intact bilaterally. VIII - Hearing & vestibular intact bilaterally. X - Palate elevates symmetrically, mild dysarthria. XI - Chin turning & shoulder shrug intact bilaterally. XII - Tongue protrusion intact.  Motor Strength - The patient's strength was normal in all extremities and pronator drift was absent.  Bulk was normal and fasciculations were absent.   Motor Tone - Muscle tone was assessed at the  neck and appendages and was normal.  Reflexes - The patient's reflexes were 1+ in all extremities and he had no pathological reflexes.  Sensory - Light touch, temperature/pinprick were assessed and were symmetrical.    Coordination - The patient had normal movements in the hands and feet with no ataxia or dysmetria.  Tremor was absent.  Gait and Station - deferred.   ASSESSMENT/PLAN Mr. DEMARR KANGAS is a 60 y.o. 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 Bronson Curb.   Mid basilar artery stenosis s/p BA stent  Stent placed 02/04/2016 by Dr. Bronson Curb Diet - low sodium heart healthy  aspirin 325 mg daily and clopidogrel 75 mg daily prior to admission held for procedure, was on IV heparin post procedure, off at 0700 this am, now on aspirin 325 mg daily and clopidogrel 75 mg daily as post elective basilar stenting on 02/04/16. will start anticoagulation with Eliquis + Plavix continue and DC Aspirin.  Patient counseled to be compliant with his  antithrombotic medications  Ongoing aggressive stroke risk factor management  Therapy recommendations:  Continue OP PT and OT as ordered last week  Disposition:  Discharge home planned  Atrial Fibrillation  New diagnosis during hospitalization last week   Home anticoagulation:  No anticoagulation  Start eliquis today along with plavix   Hypertension  Stable  Hyperlipidemia Hypertriglyceridemia  Home meds:  lipitor 80 and Tricor 145, resumed in hospital  LDL unable to calculate, goal < 70  High TG at near 2000 and high total Chol at 500s  Continue statin at discharge  Needs aggressive cholesterol management with PCSK-9 inhibitors such as praluent or repatha   Can follow up with PCP or cardiology Gwenlyn Found) for management - need PA  Pt stated that he will make appointment with Dr. Gwenlyn Found soon for consideration of PCSK 9 inhibitors  Diabetes type II  HgbA1c 11.5, goal < 7.0  Uncontrolled  Need close PCP follow up  DM education  Other Stroke Risk Factors  Obesity, Body mass index is 34.36 kg/(m^2)., recommend weight loss, diet and exercise as appropriate   Hx stroke/TIA  01/2016 L pontine infarct in setting of recent R pontine infarct with progressive severe BA stenosis  09/2015 R pontine infarct i setting of R VA occlusion and moderate to severe VA stenosis  Coronary artery disease s/p CABG  PVD s/p fem-tib bypass 08/2015  Hospital day # 1  Neurology will sign off. Please call with questions. Pt will follow up with [facility]. Leonie Man at [facility] in about 2 months. Thanks for the consult.  Rosalin Hawking, MD PhD Stroke Neurology 02/05/2016 6:50 PM      To contact Stroke Continuity provider, please refer to [email or web address] After hours, contact General Neurology

## 2016-02-06 ENCOUNTER — Telehealth (HOSPITAL_COMMUNITY): Payer: Self-pay

## 2016-02-06 ENCOUNTER — Telehealth: Payer: Self-pay | Admitting: Cardiovascular Disease

## 2016-02-06 NOTE — Telephone Encounter (Signed)
No answer. Left message to call back.   

## 2016-02-06 NOTE — Telephone Encounter (Signed)
New message   Pt just got out of hospital and needs to see Dr. Gwenlyn Found at an earlier time than available. He was offered the next available time and he he declined that time and to see a PA. Please call.

## 2016-02-06 NOTE — Telephone Encounter (Signed)
Called to schedule f/u, left message for pt to return call. AW 

## 2016-02-07 NOTE — Telephone Encounter (Signed)
Called patient on 02/07/16 at 4:47pm to call me back to schedule appt

## 2016-02-18 ENCOUNTER — Ambulatory Visit (HOSPITAL_COMMUNITY)
Admission: RE | Admit: 2016-02-18 | Discharge: 2016-02-18 | Disposition: A | Discharge status: Home / Self Care | Payer: BLUE CROSS/BLUE SHIELD | Source: Ambulatory Visit | Attending: Radiology | Admitting: Radiology

## 2016-02-18 DIAGNOSIS — I651 Occlusion and stenosis of basilar artery: Secondary | ICD-10-CM

## 2016-02-18 HISTORY — PX: IR GENERIC HISTORICAL: IMG1180011

## 2016-02-19 ENCOUNTER — Encounter: Payer: Self-pay | Admitting: Cardiovascular Disease

## 2016-02-19 ENCOUNTER — Ambulatory Visit (INDEPENDENT_AMBULATORY_CARE_PROVIDER_SITE_OTHER): Payer: BLUE CROSS/BLUE SHIELD | Admitting: Cardiovascular Disease

## 2016-02-19 VITALS — BP 156/76 | HR 78 | Ht 71.0 in | Wt 245.0 lb

## 2016-02-19 DIAGNOSIS — I2583 Coronary atherosclerosis due to lipid rich plaque: Secondary | ICD-10-CM

## 2016-02-19 DIAGNOSIS — I480 Paroxysmal atrial fibrillation: Secondary | ICD-10-CM

## 2016-02-19 DIAGNOSIS — R0600 Dyspnea, unspecified: Secondary | ICD-10-CM

## 2016-02-19 DIAGNOSIS — R0609 Other forms of dyspnea: Secondary | ICD-10-CM | POA: Diagnosis not present

## 2016-02-19 DIAGNOSIS — I2510 Atherosclerotic heart disease of native coronary artery without angina pectoris: Secondary | ICD-10-CM | POA: Diagnosis not present

## 2016-02-19 DIAGNOSIS — E785 Hyperlipidemia, unspecified: Secondary | ICD-10-CM

## 2016-02-19 DIAGNOSIS — I639 Cerebral infarction, unspecified: Secondary | ICD-10-CM

## 2016-02-19 DIAGNOSIS — I10 Essential (primary) hypertension: Secondary | ICD-10-CM | POA: Diagnosis not present

## 2016-02-19 DIAGNOSIS — I6350 Cerebral infarction due to unspecified occlusion or stenosis of unspecified cerebral artery: Secondary | ICD-10-CM

## 2016-02-19 NOTE — Assessment & Plan Note (Signed)
History of CAD status post coronary artery bypass grafting X 4  after a myocardial infarction in 2002. Subsequent catheter showed occluded grafts. He did have EECP because of intractable angina. His last Myoview stress test performed 01/18/15 was nonischemic. He does complain of increasing shortness of breath but denies chest pain.

## 2016-02-19 NOTE — Assessment & Plan Note (Signed)
History of prior left femoropopliteal bypass graft by Dr. Oneida Alar. I performed on 1 directional atherectomy followed by drug-eluting [place] plasty on his right popliteal artery 03/28/15 at that time I mistreated a patent femoropopliteal bypass graft. Subsequent to that doctor feels angiogram him revealing an occluded femoropopliteal bypass graft and he underwent redo grafting on 09/06/15 with composite non-reversed right upper extremity vein and PTFE.

## 2016-02-19 NOTE — Assessment & Plan Note (Signed)
The patient had PAF and subsequent stroke currently on oral to coagulation maintaining sinus rhythm.

## 2016-02-19 NOTE — Assessment & Plan Note (Signed)
History of hyperlipidemia and hypertriglyceridemia on fenofibrate and Lipitor. His lipid profile still nowhere near being at goal. He would be a candidate for aPC SK9 monoclonal injectable. He has appointment to see Dr. Lavetta Nielsen, Lipidologist , at [facility] in the next several weeks.

## 2016-02-19 NOTE — Assessment & Plan Note (Signed)
History of hypertension blood pressure measured 156/76. He is on lisinopril, hydrochlorothiazide and metoprolol. Continue current meds at current dosing

## 2016-02-19 NOTE — Assessment & Plan Note (Signed)
Mr. Olafson had a stroke back in March and again recently. He is on have a basilar artery stenosis and underwent basilar artery stenting by Dr. Patrecia Pour. He was also placed on oral hydration because of proximal atrial fibrillation.

## 2016-02-19 NOTE — Progress Notes (Signed)
02/19/2016 [name]   [birth year]  [id number]  Primary Physician No PCP Per Patient Primary Cardiologist: Lorretta Harp MD Renae Gloss  HPI:  [name] is a 60 year old, moderately overweight, widowed Caucasian male, father of 3, grandfather to 3 grandchildren who has a history of ischemic heart disease status post MI back in 2002 with bypass grafting x4 at that time. I last saw him in the office 08/30/15 .He has had occluded graft by cath and has had EECP in the past. His other problems include remote tobacco abuse, diabetes with peripheral neuropathy, dyslipidemia and hypertension. He also has peripheral vascular occlusive disease. I stented his left lower extremity several times, the most recent with a Viabahn endoprosthesis with an excellent result. This ultimated closed probably because of his failure to continue his Plavix. He ultimately underwent above-to-below-the-knee bypass grafting by Dr. Ruta Hinds which eventually closed as well. Dr. Oneida Alar recently did Dopplers on him revealing a right ABI of 1.03 and a left of 0.66. His last Myoview performed Dec 12, 2011, revealed inferolateral thinning towards the apex. He is otherwise asymptomatic.his lipids are followed by Dr. Chalmers Cater, his endocrinologist. He did have a negative Myoview in August of last year prior to orthopedic surgery. He denies chest pain but does complain of increasing dyspnea on exertion. Recent lower extremity Dopplers performed by Dr. Oneida Alar revealed a right ABI of 0.76 and a left of 0.61. He does complain of bilateral lower extremity lifestyle limiting claudication. Recent Dopplers performed 02/05/15 revealed a right ABI 0.79 with a high frequency signal in the right popliteal artery and one-vessel runoff, left ABI of 0.66 with an occluded popliteal artery. A 2-D echo and Myoview stress test were unrevealing. I performed angiography on him 03/28/15 revealing an occluded left SFA with a patent left phlegm to  posterior tibial bypass. He had an 80% stenosis in the P1 segment of the right lower extremity with two-vessel runoff (occluded peroneal). I performed [facility] one directional atherectomy followed by drug eluding balloon angioplasty  with excellent angiographic and clinical result. His Dopplers improved and his claudication resolved. He saw Dr. Oneida Alar after my last office visit at that time was noted that his left femoropopliteal bypass graft had occluded by angiography. He underwent redo composite vein/PTFE left fem-tib bypass grafting. He had a stroke back in March and a subsequent stroke last month. He was noted to be in paroxysmal A. fib and was started on Eliquis. He also underwent basilar artery stenting by Dr. Patrecia Pour. He is supposed to start drug rehabilitation. He also complains of increasing dyspnea on exertion since his stroke. A 2-D echo reveals slight decline in his EF down to the 45-50% range..   Current Outpatient Prescriptions  Medication Sig Dispense Refill  . apixaban (ELIQUIS) 5 MG TABS tablet Take 1 tablet (5 mg total) by mouth 2 (two) times daily. 60 tablet 2  . atorvastatin (LIPITOR) 80 MG tablet Take 1 tablet (80 mg total) by mouth daily. 30 tablet 0  . clopidogrel (PLAVIX) 75 MG tablet TAKE 1 TABLET BY MOUTH EVERY DAY 90 tablet 3  . fenofibrate (TRICOR) 145 MG tablet Take 1 tablet (145 mg total) by mouth daily. 90 tablet 3  . gabapentin (NEURONTIN) 300 MG capsule Take 300-600 mg by mouth 2 (two) times daily. Take 600mg  in the morning and 300mg  in the evening.    . insulin aspart (NOVOLOG) 100 UNIT/ML injection Inject 40 Units into the skin 3 (three) times daily before  meals. (Patient taking differently: Inject 35 Units into the skin 3 (three) times daily before meals. ) 10 mL 11  . LEVEMIR FLEXTOUCH 100 UNIT/ML Pen Inject 80 Units into the skin 2 (two) times daily. Reported on 08/21/2015 (Patient taking differently: Inject 75 Units into the skin 2 (two) times daily. Reported on  08/21/2015) 15 mL 4  . lisinopril-hydrochlorothiazide (PRINZIDE,ZESTORETIC) 20-12.5 MG per tablet Take 1 tablet by mouth daily with breakfast.     . metFORMIN (GLUCOPHAGE-XR) 500 MG 24 hr tablet Take 1 tablet (500 mg total) by mouth 2 (two) times daily.    . metoprolol succinate (TOPROL-XL) 25 MG 24 hr tablet TAKE 1 TABLET (25 MG TOTAL) BY MOUTH DAILY. 30 tablet 8   No current facility-administered medications for this visit.     No Known Allergies  Social History   Social History  . Marital status: Widowed    Spouse name: N/A  . Number of children: N/A  . Years of education: N/A   Occupational History  . Not on file.   Social History Main Topics  . Smoking status: Never Smoker  . Smokeless tobacco: Never Used  . Alcohol use No  . Drug use: No  . Sexual activity: Yes   Other Topics Concern  . Not on file   Social History Narrative  . No narrative on file     Review of Systems: General: negative for chills, fever, night sweats or weight changes.  Cardiovascular: negative for chest pain, dyspnea on exertion, edema, orthopnea, palpitations, paroxysmal nocturnal dyspnea or shortness of breath Dermatological: negative for rash Respiratory: negative for cough or wheezing Urologic: negative for hematuria Abdominal: negative for nausea, vomiting, diarrhea, bright red blood per rectum, melena, or hematemesis Neurologic: negative for visual changes, syncope, or dizziness All other systems reviewed and are otherwise negative except as noted above.    Blood pressure (!) 156/76, pulse 78, height 5\' 11"  (1.803 m), weight 245 lb (111.1 kg).  General appearance: alert and no distress Neck: no adenopathy, no carotid bruit, no JVD, supple, symmetrical, trachea midline and thyroid not enlarged, symmetric, no tenderness/mass/nodules Lungs: clear to auscultation bilaterally Heart: regular rate and rhythm, S1, S2 normal, no murmur, click, rub or gallop Extremities: extremities normal,  atraumatic, no cyanosis or edema  EKG normal sinus rhythm 78 with septal Q waves and lateral T-wave inversion. I personally reviewed this EKG  ASSESSMENT AND PLAN:   Coronary artery disease, history of CABG History of CAD status post coronary artery bypass grafting X 4  after a myocardial infarction in 2002. Subsequent catheter showed occluded grafts. He did have EECP because of intractable angina. His last Myoview stress test performed 01/18/15 was nonischemic. He does complain of increasing shortness of breath but denies chest pain.  PAD (peripheral artery disease) (HCC) History of prior left femoropopliteal bypass graft by Dr. Oneida Alar. I performed on 1 directional atherectomy followed by drug-eluting Whitaker plasty on his right popliteal artery 03/28/15 at that time I mistreated a patent femoropopliteal bypass graft. Subsequent to that doctor feels angiogram him revealing an occluded femoropopliteal bypass graft and he underwent redo grafting on 09/06/15 with composite non-reversed right upper extremity vein and PTFE.  Essential hypertension History of hypertension blood pressure measured 156/76. He is on lisinopril, hydrochlorothiazide and metoprolol. Continue current meds at current dosing  Hyperlipidemia History of hyperlipidemia and hypertriglyceridemia on fenofibrate and Lipitor. His lipid profile still nowhere near being at goal. He would be a candidate for aPC SK9 monoclonal injectable. He  has appointment to see Dr. Lavetta Nielsen, Lipidologist , at [facility] in the next several weeks.  Dyspnea on exertion Patient complains of increasing dyspnea on exertion exertion since his last stroke. His most recent 2-D echo reveals a slight decline in his LV function measured 45-50% on 01/31/16. We will continue to monitor this.  Paroxysmal atrial fibrillation (HCC) The patient had PAF and subsequent stroke currently on oral to coagulation maintaining sinus rhythm.  Left pontine stroke [facility]) Mr. Henegar had  a stroke back in March and again recently. He is on have a basilar artery stenosis and underwent basilar artery stenting by Dr. Patrecia Pour. He was also placed on oral hydration because of proximal atrial fibrillation.      Lorretta Harp MD FACP,FACC,FAHA, [facility] 02/19/2016 2:41 PM

## 2016-02-19 NOTE — Patient Instructions (Signed)
Medication Instructions:  Your physician recommends that you continue on your current medications as directed. Please refer to the Current Medication list given to you today.  Follow-Up: Your physician wants you to follow-up in: [place].   You will receive a reminder letter in the mail two months in advance. If you don't receive a letter, please call our office to schedule the follow-up appointment.   If you need a refill on your cardiac medications before your next appointment, please call your pharmacy.

## 2016-02-19 NOTE — Assessment & Plan Note (Signed)
Patient complains of increasing dyspnea on exertion exertion since his last stroke. His most recent 2-D echo reveals a slight decline in his LV function measured 45-50% on 01/31/16. We will continue to monitor this.

## 2016-02-21 ENCOUNTER — Encounter (HOSPITAL_COMMUNITY): Payer: Self-pay | Admitting: Interventional Radiology

## 2016-02-25 ENCOUNTER — Other Ambulatory Visit: Payer: Self-pay | Admitting: Cardiovascular Disease

## 2016-02-25 NOTE — Telephone Encounter (Signed)
Rx(s) sent to pharmacy electronically.  

## 2016-03-02 ENCOUNTER — Other Ambulatory Visit: Payer: Self-pay | Admitting: Cardiovascular Disease

## 2016-03-03 ENCOUNTER — Encounter: Payer: Self-pay | Admitting: Vascular Surgery

## 2016-03-04 ENCOUNTER — Other Ambulatory Visit: Payer: Self-pay

## 2016-03-04 ENCOUNTER — Telehealth: Payer: Self-pay | Admitting: Neurology

## 2016-03-04 DIAGNOSIS — I639 Cerebral infarction, unspecified: Secondary | ICD-10-CM

## 2016-03-04 DIAGNOSIS — I6350 Cerebral infarction due to unspecified occlusion or stenosis of unspecified cerebral artery: Secondary | ICD-10-CM

## 2016-03-04 NOTE — Telephone Encounter (Signed)
DR.Sethi this patient had outpatient order in [facility]. Pt is saying he cannot speak, walk well. Also he needs speech therapy. The pt was call by [facility] and now he wants in patient home health. Advise me thanks

## 2016-03-04 NOTE — Telephone Encounter (Signed)
Pt called in requesting rehab orders. 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 He has not been seen here before. He has an appt in Sept. But would like to start rehab as soon as possible. He says he needs help with walking and speaking . He can not walk, speak or swallow well. Pt says the hospital did not set any of this up for him. Please call and advise [phone]

## 2016-03-04 NOTE — Telephone Encounter (Signed)
Rn spoke with VI[facility] patients family member. Rn ask for patient but he was unavailable at the time. Rn stated pt refuse outpatient rehab and wanted in home therapy. Jocelyn Lamer stated pt can go to outpatient now. ORders put in for outpatient therapy at [facility].

## 2016-03-05 ENCOUNTER — Encounter: Payer: Self-pay | Admitting: Vascular Surgery

## 2016-03-05 ENCOUNTER — Encounter: Payer: Self-pay | Admitting: *Deleted

## 2016-03-05 ENCOUNTER — Ambulatory Visit (HOSPITAL_COMMUNITY)
Admission: RE | Admit: 2016-03-05 | Discharge: 2016-03-05 | Disposition: A | Discharge status: Home / Self Care | Payer: BLUE CROSS/BLUE SHIELD | Source: Ambulatory Visit | Attending: Cardiovascular Disease | Admitting: Cardiovascular Disease

## 2016-03-05 ENCOUNTER — Other Ambulatory Visit: Payer: Self-pay | Admitting: Vascular Surgery

## 2016-03-05 ENCOUNTER — Ambulatory Visit (INDEPENDENT_AMBULATORY_CARE_PROVIDER_SITE_OTHER): Payer: BLUE CROSS/BLUE SHIELD | Admitting: Vascular Surgery

## 2016-03-05 ENCOUNTER — Ambulatory Visit (INDEPENDENT_AMBULATORY_CARE_PROVIDER_SITE_OTHER)
Admission: RE | Admit: 2016-03-05 | Discharge: 2016-03-05 | Disposition: A | Discharge status: Home / Self Care | Payer: BLUE CROSS/BLUE SHIELD | Source: Ambulatory Visit | Attending: Vascular Surgery | Admitting: Vascular Surgery

## 2016-03-05 VITALS — BP 135/81 | HR 62 | Ht 71.0 in | Wt 246.6 lb

## 2016-03-05 DIAGNOSIS — Z95828 Presence of other vascular implants and grafts: Secondary | ICD-10-CM | POA: Insufficient documentation

## 2016-03-05 DIAGNOSIS — I739 Peripheral vascular disease, unspecified: Secondary | ICD-10-CM

## 2016-03-05 NOTE — Progress Notes (Signed)
Patient name: [name]    MRN: [id number]        DOB: 1955/12/02        Sex: male   REASON FOR VISIT: Postoperative follow-up    HPI: [name] is a 60 y.o. male who presents for follow-up status post left superficial femoral to posterior tibial artery bypass using composite non-reversed right arm vein and PTFE on 09/06/2015. This was performed for rest pain. Today, the patient states that he has rest pain has completely resolved. The patient is doing well without any other complaints in his legs. He is not smoking. He has had multiple strokes in the past year. This has left him fairly disabled with lower extremity weakness making his gait very slow and needing support. He also now has some swallowing issues and is unable to drink thin liquids. He also has some speech difficulty. He is on Plavix and Eliquis.    Carotid duplex workup was negative.   He continues to walk but is still having some weakness difficulties and some cognitive difficulties post stroke. I reviewed his physical therapy and occupational therapy assessment today.    Current Outpatient Prescriptions on File Prior to Visit  Medication Sig Dispense Refill  . apixaban (ELIQUIS) 5 MG TABS tablet Take 1 tablet (5 mg total) by mouth 2 (two) times daily. 60 tablet 2  . atorvastatin (LIPITOR) 80 MG tablet Take 1 tablet (80 mg total) by mouth daily. 30 tablet 0  . clopidogrel (PLAVIX) 75 MG tablet TAKE 1 TABLET BY MOUTH EVERY DAY 90 tablet 3  . fenofibrate (TRICOR) 145 MG tablet TAKE 1 TABLET (145 MG TOTAL) BY MOUTH DAILY. 90 tablet 3  . gabapentin (NEURONTIN) 300 MG capsule Take 300-600 mg by mouth 2 (two) times daily. Take 600mg  in the morning and 300mg  in the evening.    . insulin aspart (NOVOLOG) 100 UNIT/ML injection Inject 40 Units into the skin 3 (three) times daily before meals. (Patient taking differently: Inject 35 Units into the skin 3 (three) times daily before meals. ) 10 mL 11  . LEVEMIR FLEXTOUCH 100 UNIT/ML Pen  Inject 80 Units into the skin 2 (two) times daily. Reported on 08/21/2015 (Patient taking differently: Inject 75 Units into the skin 2 (two) times daily. Reported on 08/21/2015) 15 mL 4  . lisinopril-hydrochlorothiazide (PRINZIDE,ZESTORETIC) 20-12.5 MG per tablet Take 1 tablet by mouth daily with breakfast.     . metFORMIN (GLUCOPHAGE-XR) 500 MG 24 hr tablet Take 1 tablet (500 mg total) by mouth 2 (two) times daily.    . metoprolol succinate (TOPROL-XL) 25 MG 24 hr tablet TAKE 1 TABLET (25 MG TOTAL) BY MOUTH DAILY. 30 tablet 8   No current facility-administered medications on file prior to visit.      REVIEW OF SYSTEMS:    He has shortness of breath with exertion. He denies chest pain.    PHYSICAL EXAM:    Vitals:   03/05/16 1148  BP: 135/81  Pulse: 62  SpO2: 96%  Weight: 246 lb 9.6 oz (111.9 kg)  Height: 5\' 11"  (1.803 m)   VASCULAR: Left leg edema improved All incisions healed 2+ posterior tibial pulse left footAbsent pedal pulses right leg Neuro: Symmetric upper extremity and lower extremity motor strength although subtle weakness in the left leg  Data: Duplex ultrasound shows no significant narrowing in the bypass graft there is a caliber size mismatch at the distal anastomosis. ABI was greater than 1 on the left leg 0.75 on  the right leg     MEDICAL ISSUES: Patient slowly recovering from his recent stroke. He also still has some weakness in his left leg from his operation. He is unable to work at this point due to multiple disabilities from his strokes. He will follow-up with me in 3 months with a graft duplex at that time and ABIs.   Ruta Hinds, MD [facility] Office: [phone] Pager: [phone]

## 2016-03-05 NOTE — Addendum Note (Signed)
Addended by: Reola Calkins on: 03/05/2016 03:37 PM   Modules accepted: Orders

## 2016-03-07 ENCOUNTER — Other Ambulatory Visit: Payer: Self-pay | Admitting: Cardiovascular Disease

## 2016-03-09 DIAGNOSIS — Z0279 Encounter for issue of other medical certificate: Secondary | ICD-10-CM | POA: Diagnosis not present

## 2016-03-13 ENCOUNTER — Encounter: Payer: Self-pay | Admitting: *Deleted

## 2016-03-24 ENCOUNTER — Other Ambulatory Visit: Payer: Self-pay

## 2016-03-24 MED ORDER — METOPROLOL SUCCINATE ER 25 MG PO TB24: 25.0000 mg | ORAL_TABLET | Freq: Every day | ORAL | 3 refills | Status: DC

## 2016-03-30 ENCOUNTER — Telehealth: Payer: Self-pay | Admitting: Neurology

## 2016-03-30 NOTE — Telephone Encounter (Signed)
Patient was r/s with Dr. Leonie Man on Wednesday at 0800am.

## 2016-03-30 NOTE — Telephone Encounter (Signed)
Patient is returning your call.  

## 2016-04-01 ENCOUNTER — Encounter: Payer: Self-pay | Admitting: Neurology

## 2016-04-01 ENCOUNTER — Telehealth: Payer: Self-pay | Admitting: Neurology

## 2016-04-01 ENCOUNTER — Ambulatory Visit (INDEPENDENT_AMBULATORY_CARE_PROVIDER_SITE_OTHER): Payer: BLUE CROSS/BLUE SHIELD | Admitting: Neurology

## 2016-04-01 VITALS — BP 131/81 | HR 81 | Ht 71.0 in | Wt 245.8 lb

## 2016-04-01 DIAGNOSIS — I639 Cerebral infarction, unspecified: Secondary | ICD-10-CM

## 2016-04-01 NOTE — Patient Instructions (Addendum)
I had a long d/w patient and his wife about his recent Pontine strokes, basilar stenosis and angioplasty, atrial fibrillation, risk for recurrent stroke/TIAs, personally independently reviewed imaging studies and stroke evaluation results and answered questions.Continue clopidogrel 75 mg daily and Eliquis (apixaban) daily  for secondary stroke prevention and maintain strict control of hypertension with blood pressure goal below 130/90, diabetes with hemoglobin A1c goal below 6.5% and lipids with LDL cholesterol goal below 70 mg/dL. I also advised the patient to eat a healthy diet with plenty of whole grains, cereals, fruits and vegetables, exercise regularly and maintain ideal body weight .polysomnogram for sleep apnea and transcranial Doppler studies to follow his basilar stenosis. Followup in the future with my nurse practitioner in 6 months or call earlier if necessary. Stroke Prevention Some medical conditions and behaviors are associated with an increased chance of having a stroke. You may prevent a stroke by making healthy choices and managing medical conditions. HOW CAN I REDUCE MY RISK OF HAVING A STROKE?   Stay physically active. Get at least 30 minutes of activity on most or all days.  Do not smoke. It may also be helpful to avoid exposure to secondhand smoke.  Limit alcohol use. Moderate alcohol use is considered to be:  No more than 2 drinks per day for men.  No more than 1 drink per day for nonpregnant women.  Eat healthy foods. This involves:  Eating 5 or more servings of fruits and vegetables a day.  Making dietary changes that address high blood pressure (hypertension), high cholesterol, diabetes, or obesity.  Manage your cholesterol levels.  Making food choices that are high in fiber and low in saturated fat, trans fat, and cholesterol may control cholesterol levels.  Take any prescribed medicines to control cholesterol as directed by your health care provider.  Manage  your diabetes.  Controlling your carbohydrate and sugar intake is recommended to manage diabetes.  Take any prescribed medicines to control diabetes as directed by your health care provider.  Control your hypertension.  Making food choices that are low in salt (sodium), saturated fat, trans fat, and cholesterol is recommended to manage hypertension.  Ask your health care provider if you need treatment to lower your blood pressure. Take any prescribed medicines to control hypertension as directed by your health care provider.  If you are 69-94 years of age, have your blood pressure checked every 3-5 years. If you are 34 years of age or older, have your blood pressure checked every year.  Maintain a healthy weight.  Reducing calorie intake and making food choices that are low in sodium, saturated fat, trans fat, and cholesterol are recommended to manage weight.  Stop drug abuse.  Avoid taking birth control pills.  Talk to your health care provider about the risks of taking birth control pills if you are over 72 years old, smoke, get migraines, or have ever had a blood clot.  Get evaluated for sleep disorders (sleep apnea).  Talk to your health care provider about getting a sleep evaluation if you snore a lot or have excessive sleepiness.  Take medicines only as directed by your health care provider.  For some people, aspirin or blood thinners (anticoagulants) are helpful in reducing the risk of forming abnormal blood clots that can lead to stroke. If you have the irregular heart rhythm of atrial fibrillation, you should be on a blood thinner unless there is a good reason you cannot take them.  Understand all your medicine instructions.  Make  sure that other conditions (such as anemia or atherosclerosis) are addressed. SEEK IMMEDIATE MEDICAL CARE IF:   You have sudden weakness or numbness of the face, arm, or leg, especially on one side of the body.  Your face or eyelid droops to  one side.  You have sudden confusion.  You have trouble speaking (aphasia) or understanding.  You have sudden trouble seeing in one or both eyes.  You have sudden trouble walking.  You have dizziness.  You have a loss of balance or coordination.  You have a sudden, severe headache with no known cause.  You have new chest pain or an irregular heartbeat. Any of these symptoms may represent a serious problem that is an emergency. Do not wait to see if the symptoms will go away. Get medical help at once. Call your local emergency services (911 in [place] Do not drive yourself to the hospital.   This information is not intended to replace advice given to you by your health care provider. Make sure you discuss any questions you have with your health care provider.   Document Released: 08/13/2004 Document Revised: 07/27/2014 Document Reviewed: 01/06/2013 Elsevier Interactive Patient Education [employer].

## 2016-04-01 NOTE — Progress Notes (Signed)
[facility] [address] [place]. [place] [address] [phone]       OFFICE FOLLOW-UP NOTE  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 He is accompanied today by his wife.[name] is a 60 y.o. male with history of stroke in March 2017, CAD status post CABG, diabetes mellitus type 2, hypertriglyceridemia, peripheral vascular disease status post femoro/tibial bypass in February 2017 presents to the ER because of persistent difficulty with expressive aphasia and dizziness. Patient's symptoms started on 01/27/2016 when he woke up in the morning. CT of the head did not show anything acute. MRI of the brain shows left pontine infarct.  . Patient on exam is able to move all extremities without difficulty but still has expressive aphasia. No obvious facial asymmetry. Patient is beyond the window period for TPA. He was admitted for further evaluation and treatment. On the night after admission he developed worsening neurological status with dysarthria and dysphagia and right facial droop as well as right-sided weakness. Repeat CT scan showed no acute abnormality. 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 Patient had known evidence of posterior circulation disease from previous admission in March 2017. 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 He was seen by Dr. Estanislado Pandy and plan was to do elective basilar artery stenting. He returned a week later and underwent stenting by Dr. Estanislado Pandy on 02/04/16 which was uneventful. Patient postprocedure developed new-onset atrial fibrillation and was started on eliquis and Plavix was continued for the stent. Patient has multiple vascular risk factors including  peripheral vascular disease severe hyperlipidemia diabetes hypertension and mild obesity. He states his done well since discharge. He still has some slurred speech. His finish home physical occupational therapy but is starting outpatient therapy soon. Still has some occasional slurred speech and is tired. He can walk independently. His stamina and walking is limited mainly from his peripheral vascular disease. Patient is having only minor bruising but no bleeding. Patient does admit to snoring as well as wife has noticed witnessed apneas. He is not been evaluated for sleep apnea with a polysomnogram yet but is willing to get it done. He started seeing a physician and to help manage his triglycerides and lipids. He has an upcoming appointment to see Dr. Estanislado Pandy to follow this basilar stent.   ROS:   14 system review of systems is positive for fatigue, leg swelling, shortness of breath, easy bruising, slurred speech, decreased energy, runny nose, importance and all the systems negative  PMH:  Past Medical History:  Diagnosis Date  . Arthritis    "hx; cleaned it out of both shoulders"  . CAD (coronary artery disease)    OV, Dr Harlow Asa, MYOVIEW 5/12 on chart  EKG 10/12 EPIC,  chest x ray 01/07/11 EPIC  . Carpal tunnel syndrome    peripheral neuropathy  . Chronic shoulder pain    "both"  . DVT (deep venous thrombosis) (HCC)    hx LLE  . History of kidney stones   . Hyperlipidemia   . Hypertension   . Myocardial infarction ([place]) 02/2001  . Neuropathy, peripheral (HCC)    both feet  . Peripheral vascular disease, unspecified ([facility]) 03/2015   PCI to the right popliteal  . Skin cancer    "  have had them cut or burned off my face" (03/28/2015)  . Stroke ([place]) 10/10/2015  . Type II diabetes mellitus ([facility])     Social History:  Social History   Social History  . Marital status: Widowed    Spouse name: N/A  . Number of children: N/A  . Years of education: N/A   Occupational History  . Not  on file.   Social History Main Topics  . Smoking status: Never Smoker  . Smokeless tobacco: Never Used  . Alcohol use No  . Drug use: No  . Sexual activity: Yes   Other Topics Concern  . Not on file   Social History Narrative  . No narrative on file    Medications:   Current Outpatient Prescriptions on File Prior to Visit  Medication Sig Dispense Refill  . apixaban (ELIQUIS) 5 MG TABS tablet Take 1 tablet (5 mg total) by mouth 2 (two) times daily. 60 tablet 2  . atorvastatin (LIPITOR) 80 MG tablet Take 1 tablet (80 mg total) by mouth daily. 30 tablet 0  . clopidogrel (PLAVIX) 75 MG tablet TAKE 1 TABLET BY MOUTH EVERY [name] 90 tablet 3  . fenofibrate (TRICOR) 145 MG tablet TAKE 1 TABLET (145 MG TOTAL) BY MOUTH DAILY. 90 tablet 3  . gabapentin (NEURONTIN) 300 MG capsule Take 300-600 mg by mouth 2 (two) times daily. Take 600mg  in the morning and 300mg  in the evening.    . insulin aspart (NOVOLOG) 100 UNIT/ML injection Inject 40 Units into the skin 3 (three) times daily before meals. 10 mL 11  . LEVEMIR FLEXTOUCH 100 UNIT/ML Pen Inject 80 Units into the skin 2 (two) times daily. Reported on 08/21/2015 (Patient taking differently: Inject 75 Units into the skin 2 (two) times daily. Reported on 08/21/2015) 15 mL 4  . lisinopril-hydrochlorothiazide (PRINZIDE,ZESTORETIC) 20-12.5 MG per tablet Take 1 tablet by mouth daily with breakfast.     . metFORMIN (GLUCOPHAGE-XR) 500 MG 24 hr tablet Take 1 tablet (500 mg total) by mouth 2 (two) times daily.    . metoprolol succinate (TOPROL-XL) 25 MG 24 hr tablet Take 1 tablet (25 mg total) by mouth daily. 90 tablet 3   No current facility-administered medications on file prior to visit.     Allergies:  No Known Allergies  Physical Exam General: Mildly obese middle-aged Caucasian male seated, in no evident distress Head: head normocephalic and atraumatic.  Neck: supple with no carotid or supraclavicular bruits Cardiovascular: regular rate and rhythm, no  murmurs Musculoskeletal: no deformity Skin:  no rash/petichiae.Scars from prior peripheral vascular surgery in the forearm and legs Vascular:  Normal pulses all extremities Vitals:   04/01/16 0817  BP: 131/81  Pulse: 81   Neurologic Exam Mental Status: Awake and fully alert. Oriented to place and time. Recent and remote memory intact. Attention span, concentration and fund of knowledge appropriate. Mood and affect appropriate. Occasional slurs a few words. No aphasia Cranial Nerves: Fundoscopic exam reveals sharp disc margins. Pupils equal, briskly reactive to light. Extraocular movements full without nystagmus. Visual fields full to confrontation. Hearing intact. Facial sensation intact. Face, tongue, palate moves normally and symmetrically.  Motor: Normal bulk and tone. Normal strength in all tested extremity muscles. Sensory.: intact to touch ,pinprick .position and vibratory sensation.  Coordination: Rapid alternating movements normal in all extremities. Finger-to-nose and heel-to-shin performed accurately bilaterally. Gait and Station: Arises from chair without difficulty. Stance is normal. Gait demonstrates normal stride length and balance . Not able to heel, toe and  tandem walk without difficulty.  Reflexes: 1+ and symmetric. Toes downgoing.   NIHSS  1 Modified Rankin  2   ASSESSMENT: 54 year Caucasian male with recurrent pontine infarct secondary to symptomatic basilar stenosis in July 2017 status post elective angioplasty stenting. Also new diagnosis of atrial fibrillation. Multiple vascular risk factors of obesity, diabetes, hypertension, hyperlipidemia, peripheral vascular disease and intracranial stenosis    PLAN: I had a long d/w patient and his wife about his recent Pontine strokes, basilar stenosis and angioplasty, atrial fibrillation, risk for recurrent stroke/TIAs, personally independently reviewed imaging studies and stroke evaluation results and answered  questions.Continue clopidogrel 75 mg daily and Eliquis (apixaban) daily  for secondary stroke prevention and maintain strict control of hypertension with blood pressure goal below 130/90, diabetes with hemoglobin A1c goal below 6.5% and lipids with LDL cholesterol goal below 70 mg/dL. I also advised the patient to eat a healthy diet with plenty of whole grains, cereals, fruits and vegetables, exercise regularly and maintain ideal body weight .polysomnogram for sleep apnea and transcranial Doppler studies to follow his basilar stenosis. Followup in the future with my nurse practitioner in 6 months or call earlier if necessary. Greater than 50% of time during this 25 minute visit was spent on counseling,explanation of diagnosis, planning of further management, discussion with patient and family and coordination of care Antony Contras, MD  [facility] [address] [place], [place] [address]  Phone [phone] Fax [phone] Note: This document was prepared with digital dictation and possible smart phrase technology. Any transcriptional errors that result from this process are unintentional

## 2016-04-01 NOTE — Telephone Encounter (Signed)
Hi [name] Patient is ready to be scheduled for a Doppler. No Pa Needed. Thanks Hinton Dyer.

## 2016-04-03 ENCOUNTER — Ambulatory Visit: Payer: Self-pay | Admitting: Neurology

## 2016-04-08 ENCOUNTER — Ambulatory Visit (INDEPENDENT_AMBULATORY_CARE_PROVIDER_SITE_OTHER): Payer: BLUE CROSS/BLUE SHIELD

## 2016-04-08 DIAGNOSIS — I639 Cerebral infarction, unspecified: Secondary | ICD-10-CM

## 2016-04-09 ENCOUNTER — Encounter: Payer: Self-pay | Admitting: Neurology

## 2016-04-09 ENCOUNTER — Ambulatory Visit (INDEPENDENT_AMBULATORY_CARE_PROVIDER_SITE_OTHER): Payer: BLUE CROSS/BLUE SHIELD | Admitting: Neurology

## 2016-04-09 VITALS — BP 130/68 | HR 88 | Resp 20 | Ht 70.0 in | Wt 248.0 lb

## 2016-04-09 DIAGNOSIS — I25708 Atherosclerosis of coronary artery bypass graft(s), unspecified, with other forms of angina pectoris: Secondary | ICD-10-CM

## 2016-04-09 DIAGNOSIS — I739 Peripheral vascular disease, unspecified: Secondary | ICD-10-CM | POA: Diagnosis not present

## 2016-04-09 DIAGNOSIS — G4730 Sleep apnea, unspecified: Secondary | ICD-10-CM | POA: Diagnosis not present

## 2016-04-09 DIAGNOSIS — G4710 Hypersomnia, unspecified: Secondary | ICD-10-CM

## 2016-04-09 DIAGNOSIS — I6350 Cerebral infarction due to unspecified occlusion or stenosis of unspecified cerebral artery: Secondary | ICD-10-CM

## 2016-04-09 DIAGNOSIS — I639 Cerebral infarction, unspecified: Secondary | ICD-10-CM

## 2016-04-09 NOTE — Progress Notes (Signed)
SLEEP MEDICINE CLINIC   Provider:  Larey Seat, M D  Referring Provider: Garvin Fila, MD Primary Care Physician:  No PCP Per Patient  Chief Complaint  Patient presents with  . New Patient (Initial Visit)    snoring, never had sleep study    HPI:  [name] is a 60 y.o. male , seen here as a referral/ revisit  from Dr. Leonie [name] for sleep apnea evaluation, status post brainstem stroke, multi-morbid.  [name] is seen here today in the presence of his wife, who has noticed him to stop breathing at night and has witnessed snoring. Dr. city is concerned that this patient suffered a stroke and has additional risk factors such as peripheral vascular disease, type 2 diabetes mellitus, and a history of a deep venous thrombosis. He also has coronary artery disease he was followed with a Myoview in 2012 which is reviewable on Epic. The patient has chronic shoulder pain, history of kidney stones, hyperlipidemia, hypertension, peripheral neuropathy and has a history of skin cancer. 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 He had leg and arm veins harvested for coronary bypass as well. The surgery has been performed by Dr. Ruta Hinds. He suffered a brainstem or pontine stroke in July 2017.   Since [name] is no longer gainfully employed his sleep habits have not been as regular. Sleep habits are as follows: He usually goes to bed between 11 and 11:30, and he is very promptly asleep.[name] He may go to the bathroom 2-3 times each night, and this may be influenced by diabetes and diuretics. He seems to wake up spontaneously by 7 AM every morning but she may rise as late as 8:30 AM. His wife has noticed him to snore the loudest when on his back. The bedroom is described as cool quiet and dark. He rests on one pillow and usually sleeps mostly on his left poor in supine position. A comfortable  sleep position has changed since the stroke. Usually, if he stays at home he will watch TV after lunch and sometimes will fall asleep in front of the TV. These on scheduled naps may last 30 minutes. Some days he has 2 naps.  He usually does not have a sudden, irresistible urge to go to sleep. He has never before been evaluated for a sleep disorder.   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 He is accompanied today by his wife.[name] is a 60 y.o. male with history of stroke in March 2017, CAD status post CABG, diabetes mellitus type 2, hypertriglyceridemia, peripheral vascular disease status post femoro/tibial bypass in February 2017 presents to the ER because of persistent difficulty with expressive aphasia and dizziness. Patient's symptoms started on 01/27/2016 when he woke up in the morning. CT of the head did not show anything acute. MRI of the brain shows left pontine infarct.  . Patient on exam is able to move all extremities without difficulty but still has expressive aphasia. No obvious facial asymmetry. Patient is beyond the window period for TPA. He was admitted for further evaluation and treatment. On the night after admission he developed worsening neurological status with dysarthria and dysphagia and right facial droop  as well as right-sided weakness. Repeat CT scan showed no acute abnormality. 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 Patient had known evidence of posterior circulation disease from previous admission in March 2017. 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 He was seen by Dr. Estanislado Pandy and plan was to do elective basilar artery stenting. He returned a week later and underwent stenting by Dr.  Estanislado Pandy on 02/04/16 which was uneventful. Patient postprocedure developed new-onset atrial fibrillation and was started on eliquis and Plavix was continued for the stent. Patient has multiple vascular risk factors including peripheral vascular disease severe hyperlipidemia diabetes hypertension and mild obesity. He states his done well since discharge. He still has some slurred speech. His finish home physical occupational therapy but is starting outpatient therapy soon. Still has some occasional slurred speech and is tired. He can walk independently. His stamina and walking is limited mainly from his peripheral vascular disease. Patient is having only minor bruising but no bleeding. Patient does admit to snoring as well as wife has noticed witnessed apneas. He is not been evaluated for sleep apnea with a polysomnogram yet but is willing to get it done. He started seeing a physician and to help manage his triglycerides and lipids. He has an upcoming appointment to see Dr. Estanislado Pandy to follow this basilar stent.  Social history - caffeine user, no coffee, -iced tea, no soda. 24 ounces.  Review of Systems: Out of a complete 14 system review, the patient complains of only the following symptoms, and all other reviewed systems are negative. Patient has a history of diabetes for the last 20 years. He had peripheral arterial vascular occlusive disease for the last 3 years his first surgery was a femoropopliteal bypass.  Epworth score is endorsed at 13 points and the fatigue severity scale-FSS at 39 points. The geriatric depression score at 5 out of 15 points, the patient further endorsed snoring, easy bruising, muscle cramps and aches, trouble with swallowing and voice modulation, weakness, slurring of speech sleepiness and daytime and desinterest in activities.   Social History   Social History  . Marital status: Widowed    Spouse name: N/A  . Number of children: N/A  . Years of education: N/A    Occupational History  . Not on file.   Social History Main Topics  . Smoking status: Never Smoker  . Smokeless tobacco: Never Used  . Alcohol use No  . Drug use: No  . Sexual activity: Yes   Other Topics Concern  . Not on file   Social History Narrative  . No narrative on file    Family History  Problem Relation Age of Onset  . Cancer Mother     Breast and Brain tumor  . Cancer Father     Blood vessel tumor    Past Medical History:  Diagnosis Date  . Arthritis    "hx; cleaned it out of both shoulders"  . CAD (coronary artery disease)    OV, Dr Harlow Asa, MYOVIEW 5/12 on chart  EKG 10/12 EPIC,  chest x ray 01/07/11 EPIC  . Carpal tunnel syndrome    peripheral neuropathy  . Chronic shoulder pain    "both"  . DVT (deep venous thrombosis) (HCC)    hx LLE  . History of kidney stones   . Hyperlipidemia   . Hypertension   . Myocardial infarction ([place]) 02/2001  . Neuropathy, peripheral (HCC)    both feet  .  Peripheral vascular disease, unspecified ([place]) 03/2015   PCI to the right popliteal  . Skin cancer    "have had them cut or burned off my face" (03/28/2015)  . Stroke ([place]) 10/10/2015  . Type II diabetes mellitus ([place])     Past Surgical History:  Procedure Laterality Date  . APPENDECTOMY  1977  . CARDIAC CATHETERIZATION  2002      . CARPAL TUNNEL RELEASE Right 2000's  . CARPAL TUNNEL RELEASE  12/18/2011   Procedure: CARPAL TUNNEL RELEASE;  Surgeon: Mcarthur Rossetti, MD;  Location: [facility];  Service: Orthopedics;  Laterality: Left;  Left Open Carpal Tunnel Release  . CORONARY ANGIOPLASTY    . CORONARY ARTERY BYPASS GRAFT  2002   CABG X 4  . CYSTOSCOPY  several done in past  . FEMORAL-TIBIAL BYPASS GRAFT Left 01/07/11   fem-posterior tibial BPG using reversed left GSV               12/15/11 OK BY DR Ninfa Linden TO CONTINUE ASA AND PLAVIX  . FEMORAL-TIBIAL BYPASS GRAFT Left 09/06/2015   Procedure: LEFT FEMORAL-POSTERIOR TIBIAL ARTERY BYPASS GRAFT WITH  COMPOSITE PTFE AND RIGHT ARM VEIN;  Surgeon: Elam Dutch, MD;  Location: [facility];  Service: Vascular;  Laterality: Left;  . FEMOROPOPLITEAL THROMBECTOMY / EMBOLECTOMY  ~ 2010  . FRACTURE SURGERY    . IR GENERIC HISTORICAL  02/18/2016   IR RADIOLOGIST EVAL & MGMT 02/18/2016 [facility]  . KNEE ARTHROSCOPY Left X 2  . LAPAROSCOPIC CHOLECYSTECTOMY  1990's  . LITHOTRIPSY  several done in past  . ORIF RADIUS & ULNA FRACTURES Left   . PERIPHERAL VASCULAR CATHETERIZATION N/A 03/28/2015   Procedure: Lower Extremity Angiography;  Surgeon: Lorretta Harp, MD;  Location: [facility];  Service: Cardiovascular;  Laterality: N/A;  . PERIPHERAL VASCULAR CATHETERIZATION Right 03/28/2015   Procedure: Peripheral Vascular Atherectomy;  Surgeon: Lorretta Harp, MD;  Location: [facility];  Service: Cardiovascular;  Laterality: Right;  popliteal;   . PERIPHERAL VASCULAR CATHETERIZATION N/A 08/23/2015   Procedure: Abdominal Aortogram;  Surgeon: Elam Dutch, MD;  Location: [facility];  Service: Cardiovascular;  Laterality: N/A;  . POPLITEAL ARTERY STENT Left 2010-2012 X 4  . RADIOLOGY WITH ANESTHESIA N/A 02/04/2016   Procedure: Basilar artery angioplasty with stenting;  Surgeon: Luanne Bras, MD;  Location: [facility];  Service: Radiology;  Laterality: N/A;  . SHOULDER ARTHROSCOPY Left   . SHOULDER ARTHROSCOPY Right 12/18/2011  . SHOULDER ARTHROSCOPY  07/08/2012   Procedure: ARTHROSCOPY SHOULDER;  Surgeon: Mcarthur Rossetti, MD;  Location: [facility];  Service: Orthopedics;  Laterality: Left;  Left Shoulder Arthroscopy with Manipulation and Extensive Debridement  . SHOULDER ARTHROSCOPY WITH ROTATOR CUFF REPAIR Left 07/14/2013   Procedure: LEFT SHOULDER ARTHROSCOPY WITH EXTENSIVE DEBRIDEMENT, DISTAL CLAVICLE REPAIR;  Surgeon: Mcarthur Rossetti, MD;  Location: [facility];  Service: Orthopedics;  Laterality: Left;  . SKIN CANCER EXCISION     "left side of my forehead"  . VEIN HARVEST Right  09/06/2015   Procedure: RIGHT ARM VEIN HARVEST;  Surgeon: Elam Dutch, MD;  Location: [facility];  Service: Vascular;  Laterality: Right;    Current Outpatient Prescriptions  Medication Sig Dispense Refill  . apixaban (ELIQUIS) 5 MG TABS tablet Take 1 tablet (5 mg total) by mouth 2 (two) times daily. 60 tablet 2  . atorvastatin (LIPITOR) 80 MG tablet Take 1 tablet (80 mg total) by mouth daily. 30 tablet 0  . clopidogrel (PLAVIX)  75 MG tablet TAKE 1 TABLET BY MOUTH EVERY DAY 90 tablet 3  . fenofibrate (TRICOR) 145 MG tablet TAKE 1 TABLET (145 MG TOTAL) BY MOUTH DAILY. 90 tablet 3  . gabapentin (NEURONTIN) 300 MG capsule Take 300-600 mg by mouth 2 (two) times daily. Take 600mg  in the morning and 300mg  in the evening.    . insulin aspart (NOVOLOG) 100 UNIT/ML injection Inject 40 Units into the skin 3 (three) times daily before meals. 10 mL 11  . LEVEMIR FLEXTOUCH 100 UNIT/ML Pen Inject 80 Units into the skin 2 (two) times daily. Reported on 08/21/2015 (Patient taking differently: Inject 75 Units into the skin 2 (two) times daily. Reported on 08/21/2015) 15 mL 4  . lisinopril-hydrochlorothiazide (PRINZIDE,ZESTORETIC) 20-12.5 MG per tablet Take 1 tablet by mouth daily with breakfast.     . metFORMIN (GLUCOPHAGE-XR) 500 MG 24 hr tablet Take 1 tablet (500 mg total) by mouth 2 (two) times daily.    . metoprolol succinate (TOPROL-XL) 25 MG 24 hr tablet Take 1 tablet (25 mg total) by mouth daily. 90 tablet 3   No current facility-administered medications for this visit.     Allergies as of 04/09/2016  . (No Known Allergies)    Vitals: BP 130/68   Pulse 88   Resp 20   Ht 5\' 10"  (1.778 m)   Wt 248 lb (112.5 kg)   BMI 35.58 kg/m  Last Weight:  Wt Readings from Last 1 Encounters:  04/09/16 248 lb (112.5 kg)   [id number] mass index is 35.58 kg/m.     Last Height:   Ht Readings from Last 1 Encounters:  04/09/16 5\' 10"  (1.778 m)    Physical exam:  General: The patient is awake, alert and appears  not in acute distress. The patient is well groomed. Head: Normocephalic, atraumatic. Neck is supple. Mallampati  5,  neck circumference: 18. 5 Nasal airflow  Congested , Retrognathia is*seen. The patient has facial hair, and natural teeth. Cardiovascular:  Regular rate and rhythm , without  murmurs or carotid bruit, and without distended neck veins. Respiratory: Lungs are clear to auscultation. Skin:  Without evidence of edema, or rash Trunk: BMI is elevated .   Neurologic exam : The patient is awake and alert, oriented to place and time.   Memory subjective described as intact. Attention span & concentration ability appears normal.  Speech is fluent,  with dysarthria, dysphonia.  Mood and affect are appropriate.  Cranial nerves: Pupils are equal and briskly reactive to light. . Extraocular movements  in vertical and horizontal planes intact and without nystagmus. Visual fields by finger perimetry are intact. Hearing to finger rub intact.   Facial sensation intact to fine touch.  Facial motor strength droop in the left,  but tongue  moves midline.  I cannot appreciate the uvula. Shoulder shrug was symmetrical.   The patient was advised of the nature of the diagnosed sleep disorder , the treatment options and risks for general a health and wellness arising from not treating the condition.  I spent more than 35 minutes of face to face time with the patient. Greater than 50% of time was spent in counseling and coordination of care. We have discussed the diagnosis and differential and I answered the patient's questions.     Assessment:  After physical and neurologic examination, review of laboratory studies,  Personal review of imaging studies, reports of other /same  Imaging studies ,  Results of polysomnography/ neurophysiology testing and pre-existing records as far  as provided in visit., my assessment is   1) given Mr. Rockel past medical history and comorbidities as well as the observations  of his wife, I would like for him to undergo a sleep study ASAP and will order an in lab split-night polysomnography. I have no doubt that the patient will have an AHI between 20 and 40. Given that he has the comorbidities of peripheral arterial disease, coronary artery disease, diabetes, hypertension and previous stroke in the brainstem area.  Plan:  Treatment plan and additional workup :  SPLIT night, watch for hypoxemia.      Asencion Partridge Corra Kaine MD  04/09/2016   CC: [name], [place] [address] [place], [place] [address]

## 2016-04-13 ENCOUNTER — Ambulatory Visit: Payer: BLUE CROSS/BLUE SHIELD | Admitting: *Deleted

## 2016-04-13 ENCOUNTER — Ambulatory Visit: Payer: BLUE CROSS/BLUE SHIELD | Attending: Family Medicine

## 2016-04-13 VITALS — BP 145/89 | HR 89

## 2016-04-13 DIAGNOSIS — R2689 Other abnormalities of gait and mobility: Secondary | ICD-10-CM

## 2016-04-13 DIAGNOSIS — R2681 Unsteadiness on feet: Secondary | ICD-10-CM

## 2016-04-13 DIAGNOSIS — R471 Dysarthria and anarthria: Secondary | ICD-10-CM | POA: Insufficient documentation

## 2016-04-13 DIAGNOSIS — M6281 Muscle weakness (generalized): Secondary | ICD-10-CM | POA: Diagnosis present

## 2016-04-13 NOTE — Therapy (Signed)
[facility] [address] [place], [place], [address] Phone: [phone]   Fax:  [phone]  Speech Language Pathology Evaluation  Patient Details  Name: [name] MRN: [id number] Date of Birth: 1956-04-12 Referring Provider: Dr. Leonie Man  Encounter Date: 04/13/2016      End of Session - 04/13/16 1640    Visit Number 1   Number of Visits 1   SLP Start Time [id number]   SLP Stop Time  1400   SLP Time Calculation (min) 45 min   Activity Tolerance Patient tolerated treatment well      Past Medical History:  Diagnosis Date  . Arthritis    "hx; cleaned it out of both shoulders"  . CAD (coronary artery disease)    OV, Dr Harlow Asa, MYOVIEW 5/12 on chart  EKG 10/12 EPIC,  chest x ray 01/07/11 EPIC  . Carpal tunnel syndrome    peripheral neuropathy  . Chronic shoulder pain    "both"  . DVT (deep venous thrombosis) (HCC)    hx LLE  . History of kidney stones   . Hyperlipidemia   . Hypertension   . Myocardial infarction ([facility]) 02/2001  . Neuropathy, peripheral (HCC)    both feet  . Peripheral vascular disease, unspecified ([place]) 03/2015   PCI to the right popliteal  . Skin cancer    "have had them cut or burned off my face" (03/28/2015)  . Stroke ([place]) 10/10/2015  . Type II diabetes mellitus ([place])     Past Surgical History:  Procedure Laterality Date  . APPENDECTOMY  1977  . CARDIAC CATHETERIZATION  2002      . CARPAL TUNNEL RELEASE Right 2000's  . CARPAL TUNNEL RELEASE  12/18/2011   Procedure: CARPAL TUNNEL RELEASE;  Surgeon: Mcarthur Rossetti, MD;  Location: [facility];  Service: Orthopedics;  Laterality: Left;  Left Open Carpal Tunnel Release  . CORONARY ANGIOPLASTY    . CORONARY ARTERY BYPASS GRAFT  2002   CABG X 4  . CYSTOSCOPY  several done in past  . FEMORAL-TIBIAL BYPASS GRAFT Left 01/07/11   fem-posterior tibial BPG using reversed left GSV               12/15/11 OK BY DR Ninfa Linden TO CONTINUE ASA AND PLAVIX   . FEMORAL-TIBIAL BYPASS GRAFT Left 09/06/2015   Procedure: LEFT FEMORAL-POSTERIOR TIBIAL ARTERY BYPASS GRAFT WITH COMPOSITE PTFE AND RIGHT ARM VEIN;  Surgeon: Elam Dutch, MD;  Location: [facility];  Service: Vascular;  Laterality: Left;  . FEMOROPOPLITEAL THROMBECTOMY / EMBOLECTOMY  ~ 2010  . FRACTURE SURGERY    . IR GENERIC HISTORICAL  02/18/2016   IR RADIOLOGIST EVAL & MGMT 02/18/2016 [facility] RAD  . KNEE ARTHROSCOPY Left X 2  . LAPAROSCOPIC CHOLECYSTECTOMY  1990's  . LITHOTRIPSY  several done in past  . ORIF RADIUS & ULNA FRACTURES Left   . PERIPHERAL VASCULAR CATHETERIZATION N/A 03/28/2015   Procedure: Lower Extremity Angiography;  Surgeon: Lorretta Harp, MD;  Location: [facility];  Service: Cardiovascular;  Laterality: N/A;  . PERIPHERAL VASCULAR CATHETERIZATION Right 03/28/2015   Procedure: Peripheral Vascular Atherectomy;  Surgeon: Lorretta Harp, MD;  Location: [facility];  Service: Cardiovascular;  Laterality: Right;  popliteal;   . PERIPHERAL VASCULAR CATHETERIZATION N/A 08/23/2015   Procedure: Abdominal Aortogram;  Surgeon: Elam Dutch, MD;  Location: [facility];  Service: Cardiovascular;  Laterality: N/A;  . POPLITEAL ARTERY STENT Left 2010-2012 X 4  . RADIOLOGY  WITH ANESTHESIA N/A 02/04/2016   Procedure: Basilar artery angioplasty with stenting;  Surgeon: Luanne Bras, MD;  Location: [facility];  Service: Radiology;  Laterality: N/A;  . SHOULDER ARTHROSCOPY Left   . SHOULDER ARTHROSCOPY Right 12/18/2011  . SHOULDER ARTHROSCOPY  07/08/2012   Procedure: ARTHROSCOPY SHOULDER;  Surgeon: Mcarthur Rossetti, MD;  Location: [facility];  Service: Orthopedics;  Laterality: Left;  Left Shoulder Arthroscopy with Manipulation and Extensive Debridement  . SHOULDER ARTHROSCOPY WITH ROTATOR CUFF REPAIR Left 07/14/2013   Procedure: LEFT SHOULDER ARTHROSCOPY WITH EXTENSIVE DEBRIDEMENT, DISTAL CLAVICLE REPAIR;  Surgeon: Mcarthur Rossetti, MD;  Location: [facility];   Service: Orthopedics;  Laterality: Left;  . SKIN CANCER EXCISION     "left side of my forehead"  . VEIN HARVEST Right 09/06/2015   Procedure: RIGHT ARM VEIN HARVEST;  Surgeon: Elam Dutch, MD;  Location: [facility];  Service: Vascular;  Laterality: Right;    There were no vitals filed for this visit.      Subjective Assessment - 04/13/16 1327    Subjective "My speech gets slurred."   Currently in Pain? No/denies            SLP Evaluation [facility] - 04/13/16 1327      SLP Visit Information   SLP Received On 04/13/16   Referring Provider Dr. Leonie Man   Onset Date 01/27/16   Medical Diagnosis pontine CVA     Subjective   Subjective "I don't like coming over here."   Patient/Family Stated Goal "Don't want to have another stroke."     General Information   HPI Pt had a pontine CVA in July with resultant dysarthria.    Behavioral/Cognition WFL   Mobility Status walks without a device, PT eval today     Prior Functional Status   Cognitive/Linguistic Baseline Within functional limits  multiple recent strokes, but pt reports only the most recent    Lives With Significant other     Cognition   Overall Cognitive Status Within Functional Limits for tasks assessed     Auditory Comprehension   Overall Auditory Comprehension Appears within functional limits for tasks assessed     Visual Recognition/Discrimination   Discrimination --  ? if higher level deficits noted     Reading Comprehension   Reading Status Within funtional limits     Expression   Primary Mode of Expression Verbal     Verbal Expression   Overall Verbal Expression Appears within functional limits for tasks assessed     Oral Motor/Sensory Function   Overall Oral Motor/Sensory Function Impaired     Motor Speech   Overall Motor Speech Impaired   Respiration Impaired   Level of Impairment Conversation   Phonation Normal   Resonance Within functional limits   Articulation Impaired   Level of Impairment  Conversation   Intelligibility Intelligible   Motor Planning Witnin functional limits   Effective Techniques Over-articulate;Slow rate     Standardized Assessments   Standardized Assessments  Montreal Cognitive Assessment (MOCA)   Montreal Cognitive Assessment [facility])  29/30 possible points                         SLP Education - 04/13/16 1639    Education provided Yes   Education Details compensatory strategies for dysarthria   Person(s) Educated Patient   Methods Explanation;Demonstration   Comprehension Verbalized understanding;Returned demonstration              Plan - 04/13/16 1641  Clinical Impression Statement Pt seen for SLP eval following pontine CVA in July. Pt denies any ongoing dysphagia, aphasia or cognitive impairment. Mild dysarthria noted, however pt able to independently manage dysarthria with compensatory strategies. Assessment supported pt's opinion that cognitive-linguistic function is [facility] with a score of 29/30 possible points on the [place]. Given that pt is independent with speech strategies and fully intelligible, will not pursue SLP services at this time. Pt verbalized agreement with POC.   Consulted and Agree with Plan of Care Patient      Patient will benefit from skilled therapeutic intervention in order to improve the following deficits and impairments:   Dysarthria and anarthria      G-Codes - 2016/05/09 1644    Functional Assessment Tool Used clinician judgement and ASHA NOMS   Functional Limitations Motor speech   Motor Speech Current Status [phone]) At least 1 percent but less than 20 percent impaired, limited or restricted   Motor Speech Goal Status [id number]) At least 1 percent but less than 20 percent impaired, limited or restricted   Motor Speech Goal Status [id number]) At least 1 percent but less than 20 percent impaired, limited or restricted      Problem List Patient Active Problem List   Diagnosis Date Noted  . Basilar  artery stenosis, infarct within 8 weeks ([place]) 02/04/2016  . Paroxysmal atrial fibrillation ([place]) 01/30/2016  . Left pontine CVA ([place])   . Left pontine stroke ([place]) 01/29/2016  . Stroke (cerebrum) ([place]) 01/29/2016  . Acute CVA (cerebrovascular accident) ([place]) 01/29/2016  . Occlusion and stenosis of basilar artery   . Hypertriglyceridemia 10/12/2015  . Uncontrolled diabetes mellitus with hyperglycemia, with long-term current use of insulin ([facility]) 10/12/2015  . Acute ischemic stroke ([place]) 10/10/2015  . Diabetes type 2, controlled ([place]) 10/10/2015  . CVA (cerebral infarction) 10/10/2015  . Claudication ([facility]) 03/28/2015  . Dyspnea on exertion 02/22/2015  . Pain in joint, lower leg 12/13/2014  . Cramp of both lower extremities 12/13/2014  . Essential hypertension 08/01/2013  . Hyperlipidemia 08/01/2013  . Coronary artery disease, history of CABG 08/01/2013  . Incomplete rotator cuff tear left shoulder 07/14/2013  . S/P arthroscopy of shoulder 07/14/2013  . Aftercare following surgery of the circulatory system, [place] 11/10/2012  . PVD (peripheral vascular disease) ([place]) 04/26/2012  . 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 Carpal tunnel syndrome of left wrist 12/18/2011  . Peripheral vascular disease, unspecified ([place]) 07/16/2011  . PAD (peripheral artery disease) ([place]) 04/23/2011    Vinetta Bergamo MA, CCC-SLP 05/09/2016, 4:47 PM  [facility] [address] [place], [place], [address] Phone: [phone]   Fax:  [phone]  Name: [name] MRN: [id number] Date of Birth: 03-Oct-1955

## 2016-04-13 NOTE — Therapy (Signed)
[facility] [address] [place], [place], [address] Phone: [phone]   Fax:  [phone]  Physical Therapy Evaluation  Patient Details  Name: [name] MRN: [id number] Date of Birth: 08/22/1955 Referring Provider: Dr. Leonie Man  Encounter Date: 04/13/2016      PT End of Session - 04/13/16 1517    Visit Number 1   Number of Visits 17   Date for PT Re-Evaluation 06/12/16   Authorization Type BCBS but pt in the process of applying for disability   PT Start Time 1402   PT Stop Time 1451   PT Time Calculation (min) 49 min   Equipment Utilized During Treatment --  min guard to S prn   Activity Tolerance Patient tolerated treatment well   Behavior During Therapy [facility] for tasks assessed/performed      Past Medical History:  Diagnosis Date  . Arthritis    "hx; cleaned it out of both shoulders"  . CAD (coronary artery disease)    OV, Dr Harlow Asa, MYOVIEW 5/12 on chart  EKG 10/12 EPIC,  chest x ray 01/07/11 EPIC  . Carpal tunnel syndrome    peripheral neuropathy  . Chronic shoulder pain    "both"  . DVT (deep venous thrombosis) (HCC)    hx LLE  . History of kidney stones   . Hyperlipidemia   . Hypertension   . Myocardial infarction ([place]) 02/2001  . Neuropathy, peripheral (HCC)    both feet  . Peripheral vascular disease, unspecified ([place]) 03/2015   PCI to the right popliteal  . Skin cancer    "have had them cut or burned off my face" (03/28/2015)  . Stroke ([place]) 10/10/2015  . Type II diabetes mellitus ([place])     Past Surgical History:  Procedure Laterality Date  . APPENDECTOMY  1977  . CARDIAC CATHETERIZATION  2002      . CARPAL TUNNEL RELEASE Right 2000's  . CARPAL TUNNEL RELEASE  12/18/2011   Procedure: CARPAL TUNNEL RELEASE;  Surgeon: Mcarthur Rossetti, MD;  Location: [facility];  Service: Orthopedics;  Laterality: Left;  Left Open Carpal Tunnel Release  . CORONARY ANGIOPLASTY    . CORONARY ARTERY  BYPASS GRAFT  2002   CABG X 4  . CYSTOSCOPY  several done in past  . FEMORAL-TIBIAL BYPASS GRAFT Left 01/07/11   fem-posterior tibial BPG using reversed left GSV               12/15/11 OK BY DR Ninfa Linden TO CONTINUE ASA AND PLAVIX  . FEMORAL-TIBIAL BYPASS GRAFT Left 09/06/2015   Procedure: LEFT FEMORAL-POSTERIOR TIBIAL ARTERY BYPASS GRAFT WITH COMPOSITE PTFE AND RIGHT ARM VEIN;  Surgeon: Elam Dutch, MD;  Location: [facility];  Service: Vascular;  Laterality: Left;  . FEMOROPOPLITEAL THROMBECTOMY / EMBOLECTOMY  ~ 2010  . FRACTURE SURGERY    . IR GENERIC HISTORICAL  02/18/2016   IR RADIOLOGIST EVAL & MGMT 02/18/2016 [facility] RAD  . KNEE ARTHROSCOPY Left X 2  . LAPAROSCOPIC CHOLECYSTECTOMY  1990's  . LITHOTRIPSY  several done in past  . ORIF RADIUS & ULNA FRACTURES Left   . PERIPHERAL VASCULAR CATHETERIZATION N/A 03/28/2015   Procedure: Lower Extremity Angiography;  Surgeon: Lorretta Harp, MD;  Location: [facility];  Service: Cardiovascular;  Laterality: N/A;  . PERIPHERAL VASCULAR CATHETERIZATION Right 03/28/2015   Procedure: Peripheral Vascular Atherectomy;  Surgeon: Lorretta Harp, MD;  Location: [facility];  Service: Cardiovascular;  Laterality: Right;  popliteal;   .  PERIPHERAL VASCULAR CATHETERIZATION N/A 08/23/2015   Procedure: Abdominal Aortogram;  Surgeon: Elam Dutch, MD;  Location: [facility];  Service: Cardiovascular;  Laterality: N/A;  . POPLITEAL ARTERY STENT Left 2010-2012 X 4  . RADIOLOGY WITH ANESTHESIA N/A 02/04/2016   Procedure: Basilar artery angioplasty with stenting;  Surgeon: Luanne Bras, MD;  Location: [facility];  Service: Radiology;  Laterality: N/A;  . SHOULDER ARTHROSCOPY Left   . SHOULDER ARTHROSCOPY Right 12/18/2011  . SHOULDER ARTHROSCOPY  07/08/2012   Procedure: ARTHROSCOPY SHOULDER;  Surgeon: Mcarthur Rossetti, MD;  [facility];  Service: Orthopedics;  Laterality: Left;  Left Shoulder Arthroscopy with Manipulation and Extensive  Debridement  . SHOULDER ARTHROSCOPY WITH ROTATOR CUFF REPAIR Left 07/14/2013   Procedure: LEFT SHOULDER ARTHROSCOPY WITH EXTENSIVE DEBRIDEMENT, DISTAL CLAVICLE REPAIR;  Surgeon: Mcarthur Rossetti, MD;  [facility];  Service: Orthopedics;  Laterality: Left;  . SKIN CANCER EXCISION     "left side of my forehead"  . VEIN HARVEST Right 09/06/2015   Procedure: RIGHT ARM VEIN HARVEST;  Surgeon: Elam Dutch, MD;  Location: [facility];  Service: Vascular;  Laterality: Right;    Vitals:   04/13/16 1446  BP: (!) 145/89  Pulse: 89         Subjective Assessment - 04/13/16 1411    Subjective Pt reported LE weakness and balance issues during amb. began after first CVA 09/2015 but improved after he had PT. However, balance became worse after most recent CVA in 01/2016. He now has difficulty with walking and LE weakness. Pt reports he's really dedicated to getting better and will listen this time around.  Pt reports he becomes fatigued and experiences SOB after amb. Pt currently being seen by neurologist to test for sleep apnea.   Pertinent History Hx of CVA, newly diagnosed a-fib, peripheral neuropathy in B Feet, L fem-pop with RUE vein harvest 09/06/15; hx of CAD s/p CABG, HLD, PVD, HTN, MI, DM, arthritis, DVT, bil shoulder arthroscopy, bil knee arthroscopy, multiple LE bypass grafting, basilar (s/p basilar stent angioplasty) and vertebral artery stenosis   Patient Stated Goals Improve endurance, get legs strong enough to hold up my motorcycle, and improve balance.    Currently in Pain? No/denies            [facility] PT Assessment - 04/13/16 1418      Assessment   Medical Diagnosis L pontine CVA   Referring Provider Dr. Leonie Man   Onset Date/Surgical Date 01/28/16   Hand Dominance Right     Precautions   Precautions Fall   Precaution Comments based on FGA score     Restrictions   Weight Bearing Restrictions No     Balance Screen   Has the patient fallen in the past 6 months No   Has  the patient had a decrease in activity level because of a fear of falling?  Yes   Is the patient reluctant to leave their home because of a fear of falling?  No     Home Environment   Living Environment Private residence   Living Arrangements Spouse/significant other  girlfriend, Jocelyn Lamer   Available Help at Discharge Family   Type of [place] Beds to enter   Entrance Stairs-Number of Steps 2   Entrance Stairs-Rails Can reach both   [place] One level   [place] bars - tub/shower     Prior Function   Level of Independence Independent   Vocation Other (comment)  filed for disability   Vocation Requirements not currently working   Leisure golf, riding motorcycle, gamble, swim     Cognition   Overall Cognitive Status Impaired/Different from baseline  pt reports incr. time for processing     Observation/Other Assessments-Edema    Edema --  Pitting edema in BLE.     Sensation   Light Touch Impaired by gross assessment   Additional Comments Pt reports decr. light touch starting at knees and distal to feet. Pt N/T in feet constant.      Coordination   Gross Motor Movements are Fluid and Coordinated Yes   Fine Motor Movements are Fluid and Coordinated No   Heel Shin Test Decr. speed 2/2 weakness.     Posture/Postural Control   Posture/Postural Control Postural limitations   Postural Limitations Rounded Shoulders;Forward head     ROM / Strength   AROM / PROM / Strength AROM;Strength     AROM   Overall AROM  Deficits   Overall AROM Comments B shoulder flexion, ER and IR limited 2/2 pain. BLE AROM WFL.     Strength   Overall Strength Deficits  B hip ext and PF weakness suspected 2/2 gait deviations   Overall Strength Comments tested in sitting  hip abd/add: 4/5 grossly in sitting   Strength Assessment Site Hip;Knee;Ankle   Right/Left Hip Right;Left   Right Hip Flexion 4/5   Left Hip Flexion 4/5   Right Knee Flexion 3+/5   Right Knee  Extension 5/5   Left Knee Flexion 3+/5   Left Knee Extension 5/5   Right Ankle Dorsiflexion 4/5   Left Ankle Dorsiflexion 4/5     Transfers   Transfers Sit to Stand;Stand to Sit   Sit to Stand 5: Supervision;With upper extremity assist;From chair/3-in-1   Stand to Sit 5: Supervision;With upper extremity assist;To chair/3-in-1     Ambulation/Gait   Ambulation/Gait Yes   Ambulation/Gait Assistance 5: Supervision   Ambulation Distance (Feet) 100 Feet   Assistive device None   Gait Pattern Trendelenburg;Step-through pattern;Decreased stride length;Wide base of support;Decreased trunk rotation;Antalgic   Ambulation Surface Level;Indoor   Gait velocity 2.67ft/sec. no AD     Functional Gait  Assessment   Gait assessed  Yes   Gait Level Surface Walks 20 ft, slow speed, abnormal gait pattern, evidence for imbalance or deviates 10-15 in outside of the 12 in walkway width. Requires more than 7 sec to ambulate 20 ft.  7.7sec.   Change in Gait Speed Able to change speed, demonstrates mild gait deviations, deviates 6-10 in outside of the 12 in walkway width, or no gait deviations, unable to achieve a major change in velocity, or uses a change in velocity, or uses an assistive device.   Gait with Horizontal Head Turns Performs head turns smoothly with slight change in gait velocity (eg, minor disruption to smooth gait path), deviates 6-10 in outside 12 in walkway width, or uses an assistive device.   Gait with Vertical Head Turns Performs task with slight change in gait velocity (eg, minor disruption to smooth gait path), deviates 6 - 10 in outside 12 in walkway width or uses assistive device   Gait and Pivot Turn Pivot turns safely in greater than 3 sec and stops with no loss of balance, or pivot turns safely within 3 sec and stops with mild imbalance, requires small steps to catch balance.   Step Over Obstacle Is able to step over 2 stacked shoe boxes taped together (9 in total height) without  changing gait speed. No evidence of imbalance.   Gait with Narrow Base of Support Ambulates 4-7 steps.   Gait with Eyes Closed Walks 20 ft, uses assistive device, slower speed, mild gait deviations, deviates 6-10 in outside 12 in walkway width. Ambulates 20 ft in less than 9 sec but greater than 7 sec.   Ambulating Backwards Walks 20 ft, uses assistive device, slower speed, mild gait deviations, deviates 6-10 in outside 12 in walkway width.   Steps Alternating feet, must use rail.   Total Score 19                           PT Education - 04/13/16 1515    Education provided Yes   Education Details PT discussed frequency/duration and outcome measure results. PT spoke with speech therapist prior to PT eval and she reported pt c/o visuospatial issues and would benefit from OT eval, pt stated he will schedule OT eval. PT educated pt that PT can also perform occulomotor motor assessment next session prn.    Person(s) Educated Patient   Methods Explanation   Comprehension Verbalized understanding          PT Short Term Goals - 04/13/16 1524      PT SHORT TERM GOAL #1   Title Pt will verbalize understanding of risk factors and s/s of CVA, to reduce risk of another CVA. TARGET DATE FOR ALL STGS: 05/11/16   Time 4   Period Weeks   Status New     PT SHORT TERM GOAL #2   Title Pt will improve FGA score to >/=24/30 to reduce falls risk.   Time 4   Period Weeks   Status New     PT SHORT TERM GOAL #3   Title Pt will amb. 500' over even terrain with LRAD at MOD I level to improve functional mobility.    Time 4   Period Weeks   Status New     PT SHORT TERM GOAL #4   Title Pt will improve gait speed to >/=2.74ft/sec. with LRAD to safely amb. in the community.    Time 4   Period Weeks   Status New           PT Long Term Goals - 04/13/16 1526      PT LONG TERM GOAL #1   Title Pt will amb. 700' over even/uneven terrain with LRAD at MOD I level in order to travel  with family. TARGET DATE FOR ALL LTGS: 06/08/16   Time 8   Period Weeks   Status New     PT LONG TERM GOAL #2   Title Pt will improve FGA score to >/=28/30 to reduce falls risk.    Time 8   Period Weeks   Status New     PT LONG TERM GOAL #3   Title Perform 6MWT test and write goal as indicated.    Time 8   Period Weeks   Status New               Plan - 04/13/16 1517    Clinical Impression Statement Pt is a pleasant 60 y/o male presenting s/p multiple CVA (pontine) in 09/2015 and 01/2016. Pt's PMH significant for the following: Hx of CVA, newly diagnosed a-fib, peripheral neuropathy in B Feet, L fem-pop with RUE vein harvest 09/06/15; hx of CAD s/p CABG, HLD, PVD, HTN, MI, DM, arthritis, DVT, bil shoulder arthroscopy, bil knee arthroscopy, multiple LE  bypass grafting, basilar (s/p basilar stent angioplasty) and vertebral artery stenosis. Pt presented with the following deficits: gait deviations, impaired balance, decreased strength, decr. endurance, pitting edema, impaired posture, impaired sensation, and impaired coordination. Pt's FGA score indicates pt is at medium risk for falls. Pt's gait speed indicates pt is not able to safely amb. in the community. Pt requesting OPPT in [facility] [place] location, as this is close to his home.   Rehab Potential Good   Clinical Impairments Affecting Rehab Potential co-morbidities   PT Frequency 2x / week   PT Duration 8 weeks   PT Treatment/Interventions ADLs/Self Care Home Management;Patient/family education;Neuromuscular re-education;Balance training;Therapeutic exercise;Therapeutic activities;Functional mobility training;Stair training;Gait training;Orthotic Fit/Training;Vestibular;Manual techniques;Moist Heat;Electrical Stimulation;Cryotherapy;Biofeedback;DME Instruction   PT Next Visit Plan Perform 6MWT and write goal prn. Initiate walking program, strengthening and balance HEP.   Recommended Other Services OT consult, speech therapist performed  consult only   Consulted and Agree with Plan of Care Patient      Patient will benefit from skilled therapeutic intervention in order to improve the following deficits and impairments:  Abnormal gait, Decreased strength, Postural dysfunction, Difficulty walking, Decreased mobility, Decreased balance, Decreased coordination, Impaired flexibility, Pain, Decreased endurance, Decreased knowledge of use of DME, Increased edema, Impaired sensation  Visit Diagnosis: Other abnormalities of gait and mobility - Plan: PT plan of care cert/re-cert  Muscle weakness (generalized) - Plan: PT plan of care cert/re-cert  Unsteadiness on feet - Plan: PT plan of care cert/re-cert     Problem List Patient Active Problem List   Diagnosis Date Noted  . Basilar artery stenosis, infarct within 8 weeks ([place]) 02/04/2016  . Paroxysmal atrial fibrillation ([place]) 01/30/2016  . Left pontine CVA ([place])   . Left pontine stroke ([place]) 01/29/2016  . Stroke (cerebrum) ([place]) 01/29/2016  . Acute CVA (cerebrovascular accident) ([place]) 01/29/2016  . Occlusion and stenosis of basilar artery   . Hypertriglyceridemia 10/12/2015  . Uncontrolled diabetes mellitus with hyperglycemia, with long-term current use of insulin ([place]) 10/12/2015  . Acute ischemic stroke ([place]) 10/10/2015  . Diabetes type 2, controlled ([place]) 10/10/2015  . CVA (cerebral infarction) 10/10/2015  . Claudication ([place]) 03/28/2015  . Dyspnea on exertion 02/22/2015  . Pain in joint, lower leg 12/13/2014  . Cramp of both lower extremities 12/13/2014  . Essential hypertension 08/01/2013  . Hyperlipidemia 08/01/2013  . Coronary artery disease, history of CABG 08/01/2013  . Incomplete rotator cuff tear left shoulder 07/14/2013  . S/P arthroscopy of shoulder 07/14/2013  . Aftercare following surgery of the circulatory system, [place] 11/10/2012  . PVD (peripheral vascular disease) ([place]) 04/26/2012  . 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 Carpal tunnel syndrome of  left wrist 12/18/2011  . Peripheral vascular disease, unspecified ([place]) 07/16/2011  . PAD (peripheral artery disease) ([place]) 04/23/2011    Orlander Norwood L 04/13/2016, 3:31 PM  [facility] [address] [place], [place], [address] Phone: [phone]   Fax:  [phone]  Name: [name] MRN: [id number] Date of Birth: [birth year]  Geoffry Paradise, PT,DPT 04/13/16 3:31 PM Phone: [phone] Fax: [phone]

## 2016-04-15 ENCOUNTER — Ambulatory Visit: Payer: BLUE CROSS/BLUE SHIELD | Admitting: Physical Therapy

## 2016-04-17 ENCOUNTER — Ambulatory Visit: Payer: BLUE CROSS/BLUE SHIELD | Admitting: Physical Therapy

## 2016-04-17 DIAGNOSIS — R2689 Other abnormalities of gait and mobility: Secondary | ICD-10-CM | POA: Diagnosis not present

## 2016-04-17 DIAGNOSIS — M6281 Muscle weakness (generalized): Secondary | ICD-10-CM

## 2016-04-17 DIAGNOSIS — R2681 Unsteadiness on feet: Secondary | ICD-10-CM
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
## 2016-04-17 NOTE — Patient Instructions (Signed)
Standing Marching   Using a chair if necessary, march in place. Repeat __10__ times on each side. Do _1-2___ sessions per day.  http://gt2.exer.us/344   Copyright  VHI. All rights reserved.   Hip Backward Kick   Using a chair for balance, keep legs shoulder width apart and toes pointed for- ward. Slowly extend one leg back, keeping knee straight. Do not lean forward. Repeat with other leg. Repeat __10__ times on each side. Do __1-2__ sessions per day.  http://gt2.exer.us/340   Copyright  VHI. All rights reserved.   Hip Side Kick   Holding a chair for balance, keep legs shoulder width apart and toes pointed forward. Swing a leg out to side, keeping knee straight. Do not lean. Repeat using other leg. Repeat __10__ times. Do __1-2__ sessions per day.  http://gt2.exer.us/342   Copyright  VHI. All rights reserved.    Ankle Awareness    Using support for balance, stand with feet shoulder width apart. Rise up on toes. Hold __1-2__ seconds. Relax. Repeat __20__ times. Do __1-2__ sessions per day.  http://gt2.exer.us/856   Copyright  VHI. All rights reserved.

## 2016-04-20 ENCOUNTER — Ambulatory Visit: Payer: BLUE CROSS/BLUE SHIELD | Attending: Family Medicine

## 2016-04-20 ENCOUNTER — Ambulatory Visit: Payer: BLUE CROSS/BLUE SHIELD | Admitting: Occupational Therapy

## 2016-04-20 VITALS — BP 138/80

## 2016-04-20 DIAGNOSIS — R278 Other lack of coordination: Secondary | ICD-10-CM | POA: Insufficient documentation

## 2016-04-20 DIAGNOSIS — M6281 Muscle weakness (generalized): Secondary | ICD-10-CM | POA: Insufficient documentation

## 2016-04-20 DIAGNOSIS — R2681 Unsteadiness on feet: Secondary | ICD-10-CM

## 2016-04-20 DIAGNOSIS — R2689 Other abnormalities of gait and mobility: Secondary | ICD-10-CM
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
## 2016-04-20 NOTE — Therapy (Signed)
[facility] [address]  [place], [place], [address] Phone: [phone]   Fax:  [phone]  Physical Therapy Treatment  Patient Details  Name: [name] MRN: [id number] Date of Birth: Mar 07, 1956 Referring Provider: Dr. Leonie Man  Encounter Date: 04/20/2016      PT End of Session - 04/20/16 0938    Visit Number 3   Number of Visits 17   Date for PT Re-Evaluation 06/12/16   Authorization Type BCBS but pt in the process of applying for disability   PT Start Time 0932   PT Stop Time 1012   PT Time Calculation (min) 40 min   Activity Tolerance Patient tolerated treatment well   Behavior During Therapy [facility] for tasks assessed/performed      Past Medical History:  Diagnosis Date  . Arthritis    "hx; cleaned it out of both shoulders"  . CAD (coronary artery disease)    OV, Dr Harlow Asa, MYOVIEW 5/12 on chart  EKG 10/12 EPIC,  chest x ray 01/07/11 EPIC  . Carpal tunnel syndrome    peripheral neuropathy  . Chronic shoulder pain    "both"  . DVT (deep venous thrombosis) (HCC)    hx LLE  . History of kidney stones   . Hyperlipidemia   . Hypertension   . Myocardial infarction 02/2001  . Neuropathy, peripheral (HCC)    both feet  . Peripheral vascular disease, unspecified ([place]) 03/2015   PCI to the right popliteal  . Skin cancer    "have had them cut or burned off my face" (03/28/2015)  . Stroke ([place]) 10/10/2015  . Type II diabetes mellitus ([place])     Past Surgical History:  Procedure Laterality Date  . APPENDECTOMY  1977  . CARDIAC CATHETERIZATION  2002      . CARPAL TUNNEL RELEASE Right 2000's  . CARPAL TUNNEL RELEASE  12/18/2011   Procedure: CARPAL TUNNEL RELEASE;  Surgeon: Mcarthur Rossetti, MD;  [facility];  Service: Orthopedics;  Laterality: Left;  Left Open Carpal Tunnel Release  . CORONARY ANGIOPLASTY    . CORONARY ARTERY BYPASS GRAFT  2002   CABG X 4  . CYSTOSCOPY  several done in past  .  FEMORAL-TIBIAL BYPASS GRAFT Left 01/07/11   fem-posterior tibial BPG using reversed left GSV               12/15/11 OK BY DR Ninfa Linden TO CONTINUE ASA AND PLAVIX  . FEMORAL-TIBIAL BYPASS GRAFT Left 09/06/2015   Procedure: LEFT FEMORAL-POSTERIOR TIBIAL ARTERY BYPASS GRAFT WITH COMPOSITE PTFE AND RIGHT ARM VEIN;  Surgeon: Elam Dutch, MD;  Location: [place];  Service: Vascular;  Laterality: Left;  . FEMOROPOPLITEAL THROMBECTOMY / EMBOLECTOMY  ~ 2010  . FRACTURE SURGERY    . IR GENERIC HISTORICAL  02/18/2016   IR RADIOLOGIST EVAL & MGMT 02/18/2016 [facility]  . KNEE ARTHROSCOPY Left X 2  . LAPAROSCOPIC CHOLECYSTECTOMY  1990's  . LITHOTRIPSY  several done in past  . ORIF RADIUS & ULNA FRACTURES Left   . PERIPHERAL VASCULAR CATHETERIZATION N/A 03/28/2015   Procedure: Lower Extremity Angiography;  Surgeon: Lorretta Harp, MD;  Location: [place];  Service: Cardiovascular;  Laterality: N/A;  . PERIPHERAL VASCULAR CATHETERIZATION Right 03/28/2015   Procedure: Peripheral Vascular Atherectomy;  Surgeon: Lorretta Harp, MD;  Location: [place] [facility];  Service: Cardiovascular;  Laterality: Right;  popliteal;   . PERIPHERAL VASCULAR CATHETERIZATION N/A 08/23/2015   Procedure:  Abdominal Aortogram;  Surgeon: Elam Dutch, MD;  Location: [place] [facility];  Service: Cardiovascular;  Laterality: N/A;  . POPLITEAL ARTERY STENT Left 2010-2012 X 4  . RADIOLOGY WITH ANESTHESIA N/A 02/04/2016   Procedure: Basilar artery angioplasty with stenting;  Surgeon: Luanne Bras, MD;  Location: [facility];  Service: Radiology;  Laterality: N/A;  . SHOULDER ARTHROSCOPY Left   . SHOULDER ARTHROSCOPY Right 12/18/2011  . SHOULDER ARTHROSCOPY  07/08/2012   Procedure: ARTHROSCOPY SHOULDER;  Surgeon: Mcarthur Rossetti, MD;  [facility];  Service: Orthopedics;  Laterality: Left;  Left Shoulder Arthroscopy with Manipulation and Extensive Debridement  . SHOULDER ARTHROSCOPY WITH ROTATOR CUFF REPAIR Left  07/14/2013   Procedure: LEFT SHOULDER ARTHROSCOPY WITH EXTENSIVE DEBRIDEMENT, DISTAL CLAVICLE REPAIR;  Surgeon: Mcarthur Rossetti, MD;  [facility];  Service: Orthopedics;  Laterality: Left;  . SKIN CANCER EXCISION     "left side of my forehead"  . VEIN HARVEST Right 09/06/2015   Procedure: RIGHT ARM VEIN HARVEST;  Surgeon: Elam Dutch, MD;  Location: [place];  Service: Vascular;  Laterality: Right;    Vitals:   04/20/16 0936  BP: 138/80        Subjective Assessment - 04/20/16 0936    Subjective Pt. reporting he feels sore in the, "cheeks", today following performing the HEP over the weekend.     Patient Stated Goals Improve endurance, get legs strong enough to hold up my motorcycle, and improve balance.    Currently in Pain? No/denies   Pain Score 0-No pain   Multiple Pain Sites No       Today's treatment:   Therex: 138/80 mmHg initially today (also in vitals section)  NuStep: level 5, 5 min; 156/92 mmHg post-warmup (also in vitals section)  HEP review: Standing holding onto chair:        Alternating hip abduction x 10 reps each side         Alternating hip flexion march x 10 reps each side         Alternating hip extension x 10 reps each side    Neuro Re-education: Standing on blue airex pad:        Alternating hip abduction x 8 reps each side; 2 ski pole support        Alternating hip flexion march x 10 reps each side; 2 ski pole support          Alternating hip extension x 10 reps each side; 2 ski pole support          Standing with eyes closed 2 x 30 sec each        Standing with feet together and side<>side head turns; 1 ski poll support              PT Education - 04/20/16 1018    Education provided Yes   Education Details Walking program:    WEEK 1: walk once a day for 30 min: 6 min walk, 4 min rest x 3 cycles through     WEEK 2: walk once a day for 30 min: 7 min walk, 3 min rest x 3 cycles through     WEEK 3: walk once a day for 30 min: 8 min  walk, 2 min rest x 3 cycles through     Person(s) Educated Patient   Methods Handout   Comprehension Verbalized understanding;Need further instruction;Verbal cues required          PT Short Term Goals - 04/20/16  0938      PT SHORT TERM GOAL #1   Title Pt will verbalize understanding of risk factors and s/s of CVA, to reduce risk of another CVA. TARGET DATE FOR ALL STGS: 05/11/16   Time 4   Period Weeks   Status On-going     PT SHORT TERM GOAL #2   Title Pt will improve FGA score to >/=24/30 to reduce falls risk.   Time 4   Period Weeks   Status On-going     PT SHORT TERM GOAL #3   Title Pt will amb. 500' over even terrain with LRAD at MOD I level to improve functional mobility.    Time 4   Period Weeks   Status On-going     PT SHORT TERM GOAL #4   Title Pt will improve gait speed to >/=2.69ft/sec. with LRAD to safely amb. in the community.    Time 4   Period Weeks   Status On-going           PT Long Term Goals - 04/20/16 0939      PT LONG TERM GOAL #1   Title Pt will amb. 700' over even/uneven terrain with LRAD at MOD I level in order to travel with family. TARGET DATE FOR ALL LTGS: 06/08/16   Time 8   Period Weeks   Status On-going     PT LONG TERM GOAL #2   Title Pt will improve FGA score to >/=28/30 to reduce falls risk.    Time 8   Period Weeks   Status On-going     PT LONG TERM GOAL #3   Title Perform 6MWT test and write goal as indicated.    Time 8   Period Weeks   Status On-going               Plan - 04/20/16 0944    Clinical Impression Statement Today's treatment focused on standing balance and hip strengthening activities on compliant surface.  Pt. tolerated all activities well however required rest breaks throughout due to shortness of breath.  Pt. vitals responded to therex WNL.  Pt. tolerated addition of eyes closed balance on airex pad today well and remains willing and compliant with therapy.  Standing hip strengthening HEP reviewed  with pt. today with pt. demonstrating good technique.    PT Treatment/Interventions ADLs/Self Care Home Management;Patient/family education;Neuromuscular re-education;Balance training;Therapeutic exercise;Therapeutic activities;Functional mobility training;Stair training;Gait training;Orthotic Fit/Training;Vestibular;Manual techniques;Moist Heat;Electrical Stimulation;Cryotherapy;Biofeedback;DME Instruction   PT Next Visit Plan Monitor walking program tolerance (see pt. education section), continue balance and strengthening      Patient will benefit from skilled therapeutic intervention in order to improve the following deficits and impairments:  Abnormal gait, Decreased strength, Postural dysfunction, Difficulty walking, Decreased mobility, Decreased balance, Decreased coordination, Impaired flexibility, Pain, Decreased endurance, Decreased knowledge of use of DME, Increased edema, Impaired sensation  Visit Diagnosis: Other abnormalities of gait and mobility  Muscle weakness (generalized)  Unsteadiness on feet     Problem List Patient Active Problem List   Diagnosis Date Noted  . Basilar artery stenosis, infarct within 8 weeks 02/04/2016  . Paroxysmal atrial fibrillation ([place]) 01/30/2016  . Left pontine CVA ([place])   . Left pontine stroke ([place]) 01/29/2016  . Stroke (cerebrum) ([place]) 01/29/2016  . Acute CVA (cerebrovascular accident) ([place]) 01/29/2016  . Occlusion and stenosis of basilar artery   . Hypertriglyceridemia 10/12/2015  . Uncontrolled diabetes mellitus with hyperglycemia, with long-term current use of insulin ([place]) 10/12/2015  . Acute ischemic stroke ([place])  10/10/2015  . Diabetes type 2, controlled ([place]) 10/10/2015  . CVA (cerebral infarction) 10/10/2015  . Claudication ([place]) 03/28/2015  . Dyspnea on exertion 02/22/2015  . Pain in joint, lower leg 12/13/2014  . Cramp of both lower extremities 12/13/2014  . Essential hypertension 08/01/2013  . Hyperlipidemia 08/01/2013  .  Coronary artery disease, history of CABG 08/01/2013  . Incomplete rotator cuff tear left shoulder 07/14/2013  . S/P arthroscopy of shoulder 07/14/2013  . Aftercare following surgery of the circulatory system, [place] 11/10/2012  . PVD (peripheral vascular disease) ([place]) 04/26/2012  . 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 Carpal tunnel syndrome of left wrist 12/18/2011  . Peripheral vascular disease, unspecified 07/16/2011  . PAD (peripheral artery disease) ([place]) 04/23/2011    Bess Harvest, PTA 04/20/16 11:47 AM  [facility] [address]  [place], [place], [address] Phone: [phone]   Fax:  [phone]  Name: [name] MRN: [id number] Date of Birth: 1956-04-22

## 2016-04-22 ENCOUNTER — Ambulatory Visit: Payer: BLUE CROSS/BLUE SHIELD

## 2016-04-22 ENCOUNTER — Ambulatory Visit: Payer: BLUE CROSS/BLUE SHIELD | Admitting: Occupational Therapy

## 2016-04-22 VITALS — BP 142/88

## 2016-04-22 DIAGNOSIS — R278 Other lack of coordination: Secondary | ICD-10-CM

## 2016-04-22 DIAGNOSIS — R2681 Unsteadiness on feet: Secondary | ICD-10-CM

## 2016-04-22 DIAGNOSIS — R2689 Other abnormalities of gait and mobility: Secondary | ICD-10-CM | POA: Diagnosis not present

## 2016-04-22 DIAGNOSIS — M6281 Muscle weakness (generalized): Secondary | ICD-10-CM

## 2016-04-22 NOTE — Therapy (Signed)
[facility] [address]  [place], [place], [address] Phone: [phone]   Fax:  [phone]  Occupational Therapy Treatment  Patient Details  Name: [name] MRN: [id number] Date of Birth: [birth year] Referring Provider: Dr. Leonie Man  Encounter Date: 04/22/2016      OT End of Session - 04/22/16 0933    Visit Number 2   Number of Visits 8   Date for OT Re-Evaluation 05/21/16   Authorization Type BC/BS   OT Start Time 0850   OT Stop Time 0930   OT Time Calculation (min) 40 min   Activity Tolerance Patient tolerated treatment well      Past Medical History:  Diagnosis Date  . Arthritis    "hx; cleaned it out of both shoulders"  . CAD (coronary artery disease)    OV, Dr Harlow Asa, MYOVIEW 5/12 on chart  EKG 10/12 EPIC,  chest x ray 01/07/11 EPIC  . Carpal tunnel syndrome    peripheral neuropathy  . Chronic shoulder pain    "both"  . DVT (deep venous thrombosis) (HCC)    hx LLE  . History of kidney stones   . Hyperlipidemia   . Hypertension   . Myocardial infarction 02/2001  . Neuropathy, peripheral (HCC)    both feet  . Peripheral vascular disease, unspecified ([place]) 03/2015   PCI to the right popliteal  . Skin cancer    "have had them cut or burned off my face" (03/28/2015)  . Stroke ([place]) 10/10/2015  . Type II diabetes mellitus ([place])     Past Surgical History:  Procedure Laterality Date  . APPENDECTOMY  1977  . CARDIAC CATHETERIZATION  2002      . CARPAL TUNNEL RELEASE Right 2000's  . CARPAL TUNNEL RELEASE  12/18/2011   Procedure: CARPAL TUNNEL RELEASE;  Surgeon: Mcarthur Rossetti, MD;  [facility];  Service: Orthopedics;  Laterality: Left;  Left Open Carpal Tunnel Release  . CORONARY ANGIOPLASTY    . CORONARY ARTERY BYPASS GRAFT  2002   CABG X 4  . CYSTOSCOPY  several done in past  . FEMORAL-TIBIAL BYPASS GRAFT Left 01/07/11   fem-posterior tibial BPG using reversed left GSV                12/15/11 OK BY DR Ninfa Linden TO CONTINUE ASA AND PLAVIX  . FEMORAL-TIBIAL BYPASS GRAFT Left 09/06/2015   Procedure: LEFT FEMORAL-POSTERIOR TIBIAL ARTERY BYPASS GRAFT WITH COMPOSITE PTFE AND RIGHT ARM VEIN;  Surgeon: Elam Dutch, MD;  Location: [facility];  Service: Vascular;  Laterality: Left;  . FEMOROPOPLITEAL THROMBECTOMY / EMBOLECTOMY  ~ 2010  . FRACTURE SURGERY    . IR GENERIC HISTORICAL  02/18/2016   IR RADIOLOGIST EVAL & MGMT 02/18/2016 [facility]  . KNEE ARTHROSCOPY Left X 2  . LAPAROSCOPIC CHOLECYSTECTOMY  1990's  . LITHOTRIPSY  several done in past  . ORIF RADIUS & ULNA FRACTURES Left   . PERIPHERAL VASCULAR CATHETERIZATION N/A 03/28/2015   Procedure: Lower Extremity Angiography;  Surgeon: Lorretta Harp, MD;  Location: [facility];  Service: Cardiovascular;  Laterality: N/A;  . PERIPHERAL VASCULAR CATHETERIZATION Right 03/28/2015   Procedure: Peripheral Vascular Atherectomy;  Surgeon: Lorretta Harp, MD;  Location: [place] [facility];  Service: Cardiovascular;  Laterality: Right;  popliteal;   . PERIPHERAL VASCULAR CATHETERIZATION N/A 08/23/2015   Procedure: Abdominal Aortogram;  Surgeon: Elam Dutch, MD;  Location: [facility];  Service: Cardiovascular;  Laterality: N/A;  . POPLITEAL ARTERY STENT Left 2010-2012 X 4  . RADIOLOGY WITH ANESTHESIA N/A 02/04/2016   Procedure: Basilar artery angioplasty with stenting;  Surgeon: Luanne Bras, MD;  Location: [facility];  Service: Radiology;  Laterality: N/A;  . SHOULDER ARTHROSCOPY Left   . SHOULDER ARTHROSCOPY Right 12/18/2011  . SHOULDER ARTHROSCOPY  07/08/2012   Procedure: ARTHROSCOPY SHOULDER;  Surgeon: Mcarthur Rossetti, MD;  [facility];  Service: Orthopedics;  Laterality: Left;  Left Shoulder Arthroscopy with Manipulation and Extensive Debridement  . SHOULDER ARTHROSCOPY WITH ROTATOR CUFF REPAIR Left 07/14/2013   Procedure: LEFT SHOULDER ARTHROSCOPY WITH EXTENSIVE DEBRIDEMENT, DISTAL CLAVICLE REPAIR;   Surgeon: Mcarthur Rossetti, MD;  [facility];  Service: Orthopedics;  Laterality: Left;  . SKIN CANCER EXCISION     "left side of my forehead"  . VEIN HARVEST Right 09/06/2015   Procedure: RIGHT ARM VEIN HARVEST;  Surgeon: Elam Dutch, MD;  Location: [facility];  Service: Vascular;  Laterality: Right;    There were no vitals filed for this visit.      Subjective Assessment - 04/22/16 0853    Pertinent History 1st Stroke 10/08/2015, CAD, DM, CABG x4, A-Fib, hx of neuropathy bilateral hands; hx of L frozen shoulder with 3 surgeries   Patient Stated Goals To get stronger   Currently in Pain? No/denies                      OT Treatments/Exercises (OP) - 04/22/16 0001      ADLs   LB Dressing Practiced donnng/doffing shoes and socks with A/E. Pt shown all recommended A/E and told where he can purchase including: reacher, sock aide, LH sponge, LH shoe horn.      Hand Exercises   Other Hand Exercises Reviewed putty HEP - Pt demo each x 15 reps with updates/clarifications prn     Fine Motor Coordination   Other Fine Motor Exercises Reviewed coordination HEP previously issued. Therapist updated prn and pt demo each                   OT Short Term Goals - 04/20/16 0939      OT SHORT TERM GOAL #1   Title NONE           OT Long Term Goals - 04/22/16 0934      OT LONG TERM GOAL #1   Title Independent with updated coordination and putty HEP (Due 05/21/16)    Time 4   Period Weeks   Status Achieved     OT LONG TERM GOAL #2   Title Improve coordination as evidenced by performing 9 hole peg test in 30 sec or less    Baseline eval = 35.47 sec   Time 4   Period Weeks   Status New     OT LONG TERM GOAL #3   Title Improve grip strength Lt hand to 70 lbs or greater to assist with opening containers/jars   Baseline eval = 63 lbs   Time 4   Period Weeks   Status New     OT LONG TERM GOAL #4   Title Pt to verbalize understanding with A/E to assist  with washing feet and donning socks   Time 4   Period Weeks   Status Achieved               Plan - 04/22/16 0934    Clinical Impression Statement Pt met LTG's #1 and #4. Pt  progressing towards remaining goals   Rehab Potential Good   OT Frequency 2x / week   OT Duration 4 weeks   OT Treatment/Interventions Self-care/ADL training;Moist Heat;DME and/or AE instruction;Patient/family education;Therapeutic exercises;Therapeutic activities;Neuromuscular education;Functional Mobility Training;Passive range of motion;Cognitive remediation/compensation;Manual Therapy;Electrical Stimulation   Plan review theraband HEP, UBE, continue coordination, grip strength   OT Home Exercise Plan Education provided:  11/19/15 coordination HEP, green putty HEP; 11/21/15 yellow HEP   Consulted and Agree with Plan of Care Patient      Patient will benefit from skilled therapeutic intervention in order to improve the following deficits and impairments:  Decreased coordination, Impaired flexibility, Decreased endurance, Impaired sensation, Decreased activity tolerance, Pain, Impaired UE functional use, Decreased balance, Decreased cognition, Decreased strength, Decreased mobility  Visit Diagnosis: Muscle weakness (generalized)  Other lack of coordination    Problem List Patient Active Problem List   Diagnosis Date Noted  . Basilar artery stenosis, infarct within 8 weeks 02/04/2016  . Paroxysmal atrial fibrillation ([place]) 01/30/2016  . Left pontine CVA ([place])   . Left pontine stroke ([place]) 01/29/2016  . Stroke (cerebrum) ([place]) 01/29/2016  . Acute CVA (cerebrovascular accident) ([place]) 01/29/2016  . Occlusion and stenosis of basilar artery   . Hypertriglyceridemia 10/12/2015  . Uncontrolled diabetes mellitus with hyperglycemia, with long-term current use of insulin ([place]) 10/12/2015  . Acute ischemic stroke ([place]) 10/10/2015  . Diabetes type 2, controlled ([place]) 10/10/2015  . CVA (cerebral infarction)  10/10/2015  . Claudication ([place]) 03/28/2015  . Dyspnea on exertion 02/22/2015  . Pain in joint, lower leg 12/13/2014  . Cramp of both lower extremities 12/13/2014  . Essential hypertension 08/01/2013  . Hyperlipidemia 08/01/2013  . Coronary artery disease, history of CABG 08/01/2013  . Incomplete rotator cuff tear left shoulder 07/14/2013  . S/P arthroscopy of shoulder 07/14/2013  . Aftercare following surgery of the circulatory system, [place] 11/10/2012  . PVD (peripheral vascular disease) ([place]) 04/26/2012  . 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 Carpal tunnel syndrome of left wrist 12/18/2011  . Peripheral vascular disease, unspecified 07/16/2011  . PAD (peripheral artery disease) ([place]) 04/23/2011    Carey Bullocks, OTR/L 04/22/2016, 9:35 AM  [facility] [address]  [place], [place], [address] Phone: [phone]   Fax:  [phone]  Name: HARBERT FITTERER MRN: [id number] Date of Birth: 1956-04-27

## 2016-04-22 NOTE — Therapy (Signed)
[facility] [address]  [place], [place], [address] Phone: [phone]   Fax:  [phone]  Physical Therapy Treatment  Patient Details  Name: [name] MRN: [id number] Date of Birth: Apr 22, 1956 Referring Provider: Dr. Leonie Man  Encounter Date: 04/22/2016      PT End of Session - 04/22/16 0937    Visit Number 4   Number of Visits 17   Date for PT Re-Evaluation 06/12/16   Authorization Type [employer] but pt in the process of applying for disability   PT Start Time 0930   PT Stop Time 1012   PT Time Calculation (min) 42 min   Activity Tolerance Patient tolerated treatment well   Behavior During Therapy [facility] for tasks assessed/performed      Past Medical History:  Diagnosis Date  . Arthritis    "hx; cleaned it out of both shoulders"  . CAD (coronary artery disease)    OV, Dr Harlow Asa, MYOVIEW 5/12 on chart  EKG 10/12 EPIC,  chest x ray 01/07/11 EPIC  . Carpal tunnel syndrome    peripheral neuropathy  . Chronic shoulder pain    "both"  . DVT (deep venous thrombosis) (HCC)    hx LLE  . History of kidney stones   . Hyperlipidemia   . Hypertension   . Myocardial infarction 02/2001  . Neuropathy, peripheral (HCC)    both feet  . Peripheral vascular disease, unspecified ([place]) 03/2015   PCI to the right popliteal  . Skin cancer    "have had them cut or burned off my face" (03/28/2015)  . Stroke ([place]) 10/10/2015  . Type II diabetes mellitus ([place])     Past Surgical History:  Procedure Laterality Date  . APPENDECTOMY  1977  . CARDIAC CATHETERIZATION  2002      . CARPAL TUNNEL RELEASE Right 2000's  . CARPAL TUNNEL RELEASE  12/18/2011   Procedure: CARPAL TUNNEL RELEASE;  Surgeon: Mcarthur Rossetti, MD;  [facility]: [facility];  Service: Orthopedics;  Laterality: Left;  Left Open Carpal Tunnel Release  . CORONARY ANGIOPLASTY    . CORONARY ARTERY BYPASS GRAFT  2002   CABG X 4  . CYSTOSCOPY  several done in past  .  FEMORAL-TIBIAL BYPASS GRAFT Left 01/07/11   fem-posterior tibial BPG using reversed left GSV               12/15/11 OK BY DR Ninfa Linden TO CONTINUE ASA AND PLAVIX  . FEMORAL-TIBIAL BYPASS GRAFT Left 09/06/2015   Procedure: LEFT FEMORAL-POSTERIOR TIBIAL ARTERY BYPASS GRAFT WITH COMPOSITE PTFE AND RIGHT ARM VEIN;  Surgeon: Elam Dutch, MD;  Location: [facility];  Service: Vascular;  Laterality: Left;  . FEMOROPOPLITEAL THROMBECTOMY / EMBOLECTOMY  ~ 2010  . FRACTURE SURGERY    . IR GENERIC HISTORICAL  02/18/2016   IR RADIOLOGIST EVAL & MGMT 02/18/2016 [facility]  . KNEE ARTHROSCOPY Left X 2  . LAPAROSCOPIC CHOLECYSTECTOMY  1990's  . LITHOTRIPSY  several done in past  . ORIF RADIUS & ULNA FRACTURES Left   . PERIPHERAL VASCULAR CATHETERIZATION N/A 03/28/2015   Procedure: Lower Extremity Angiography;  Surgeon: Lorretta Harp, MD;  Location: [facility];  Service: Cardiovascular;  Laterality: N/A;  . PERIPHERAL VASCULAR CATHETERIZATION Right 03/28/2015   Procedure: Peripheral Vascular Atherectomy;  Surgeon: Lorretta Harp, MD;  Location: [facility];  Service: Cardiovascular;  Laterality: Right;  popliteal;   . PERIPHERAL VASCULAR CATHETERIZATION N/A 08/23/2015   Procedure:  Abdominal Aortogram;  Surgeon: Elam Dutch, MD;  Location: [facility];  Service: Cardiovascular;  Laterality: N/A;  . POPLITEAL ARTERY STENT Left 2010-2012 X 4  . RADIOLOGY WITH ANESTHESIA N/A 02/04/2016   Procedure: Basilar artery angioplasty with stenting;  Surgeon: Luanne Bras, MD;  Location: [facility];  Service: Radiology;  Laterality: N/A;  . SHOULDER ARTHROSCOPY Left   . SHOULDER ARTHROSCOPY Right 12/18/2011  . SHOULDER ARTHROSCOPY  07/08/2012   Procedure: ARTHROSCOPY SHOULDER;  Surgeon: Mcarthur Rossetti, MD;  [facility];  Service: Orthopedics;  Laterality: Left;  Left Shoulder Arthroscopy with Manipulation and Extensive Debridement  . SHOULDER ARTHROSCOPY WITH ROTATOR CUFF REPAIR Left  07/14/2013   Procedure: LEFT SHOULDER ARTHROSCOPY WITH EXTENSIVE DEBRIDEMENT, DISTAL CLAVICLE REPAIR;  Surgeon: Mcarthur Rossetti, MD;  [facility];  Service: Orthopedics;  Laterality: Left;  . SKIN CANCER EXCISION     "left side of my forehead"  . VEIN HARVEST Right 09/06/2015   Procedure: RIGHT ARM VEIN HARVEST;  Surgeon: Elam Dutch, MD;  Location: [facility];  Service: Vascular;  Laterality: Right;    Vitals:   04/22/16 0936  BP: (!) 142/88        Subjective Assessment - 04/22/16 0936    Subjective Pt. reporting he attempted walking program yesterday and felt good.     Patient Stated Goals Improve endurance, get legs strong enough to hold up my motorcycle, and improve balance.    Currently in Pain? No/denies   Pain Score 0-No pain   Multiple Pain Sites No         Today's treatment:   Therex: 142/88 mmHg initially today (also in vitals section)  NuStep: level 5, 5 min  Neuro Re-education: Standing alternating cone touch x 10 reps  Standing cross-over alternating cone touch x 10 reps  Standing alternating cone touch while on airex pad x 10 reps  Standing on airex pad holding onto 2 ski poles support:           Alternating hip abduction, extension, flexion x 10 reps each side Standing on airex pad with feet together and side<>side head turns, up/down, and diagonals x 30 sec each way Standing on airex pad with eyes closed and perturbations from therapist 2 x 30 sec   Therex: Standing holding onto treadmill with 2# on ankles:         Alternating hip flexion, ext., abduction x 10 reps each way         PT Short Term Goals - 04/20/16 [id number]      PT SHORT TERM GOAL #1   Title Pt will verbalize understanding of risk factors and s/s of CVA, to reduce risk of another CVA. TARGET DATE FOR ALL STGS: 05/11/16   Time 4   Period Weeks   Status On-going     PT SHORT TERM GOAL #2   Title Pt will improve FGA score to >/=24/30 to reduce falls risk.   Time 4    Period Weeks   Status On-going     PT SHORT TERM GOAL #3   Title Pt will amb. 500' over even terrain with LRAD at MOD I level to improve functional mobility.    Time 4   Period Weeks   Status On-going     PT SHORT TERM GOAL #4   Title Pt will improve gait speed to >/=2.65ft/sec. with LRAD to safely amb. in the community.    Time 4   Period Weeks   Status On-going  PT Long Term Goals - 04/20/16 0939      PT LONG TERM GOAL #1   Title Pt will amb. 700' over even/uneven terrain with LRAD at MOD I level in order to travel with family. TARGET DATE FOR ALL LTGS: 06/08/16   Time 8   Period Weeks   Status On-going     PT LONG TERM GOAL #2   Title Pt will improve FGA score to >/=28/30 to reduce falls risk.    Time 8   Period Weeks   Status On-going     PT LONG TERM GOAL #3   Title Perform 6MWT test and write goal as indicated.    Time 8   Period Weeks   Status On-going               Plan - 04/22/16 [id number]    Clinical Impression Statement Today's treatment focused on stepping and standing balance activity on compliant surface.  Pt. able to progress to alternating cross-over cone touch, and narrow stance head turns without overt LOB today.  Close CGA provided from therapist with all balance activity today however pt. able to self-correct balance throughout.  4-way standing SLR progressed to 2# today without fatigue reported.  Will plan to progress standing balance activity per pt. tolerance.     PT Treatment/Interventions ADLs/Self Care Home Management;Patient/family education;Neuromuscular re-education;Balance training;Therapeutic exercise;Therapeutic activities;Functional mobility training;Stair training;Gait training;Orthotic Fit/Training;Vestibular;Manual techniques;Moist Heat;Electrical Stimulation;Cryotherapy;Biofeedback;DME Instruction   PT Next Visit Plan Monitor walking program tolerance (see pt. education section), continue balance and strengthening       Patient will benefit from skilled therapeutic intervention in order to improve the following deficits and impairments:  Abnormal gait, Decreased strength, Postural dysfunction, Difficulty walking, Decreased mobility, Decreased balance, Decreased coordination, Impaired flexibility, Pain, Decreased endurance, Decreased knowledge of use of DME, Increased edema, Impaired sensation  Visit Diagnosis: Muscle weakness (generalized)  Other lack of coordination  Unsteadiness on feet     Problem List Patient Active Problem List   Diagnosis Date Noted  . Basilar artery stenosis, infarct within 8 weeks 02/04/2016  . Paroxysmal atrial fibrillation ([place]) 01/30/2016  . Left pontine CVA ([place])   . Left pontine stroke ([place]) 01/29/2016  . Stroke (cerebrum) ([place]) 01/29/2016  . Acute CVA (cerebrovascular accident) ([place]) 01/29/2016  . Occlusion and stenosis of basilar artery   . Hypertriglyceridemia 10/12/2015  . Uncontrolled diabetes mellitus with hyperglycemia, with long-term current use of insulin ([place]) 10/12/2015  . Acute ischemic stroke ([place]) 10/10/2015  . Diabetes type 2, controlled ([place]) 10/10/2015  . CVA (cerebral infarction) 10/10/2015  . Claudication ([place]) 03/28/2015  . Dyspnea on exertion 02/22/2015  . Pain in joint, lower leg 12/13/2014  . Cramp of both lower extremities 12/13/2014  . Essential hypertension 08/01/2013  . Hyperlipidemia 08/01/2013  . Coronary artery disease, history of CABG 08/01/2013  . Incomplete rotator cuff tear left shoulder 07/14/2013  . S/P arthroscopy of shoulder 07/14/2013  . Aftercare following surgery of the circulatory system, [place] 11/10/2012  . PVD (peripheral vascular disease) ([place]) 04/26/2012  . 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 Carpal tunnel syndrome of left wrist 12/18/2011  . Peripheral vascular disease, unspecified 07/16/2011  . PAD (peripheral artery disease) ([place]) 04/23/2011    Bess Harvest, PTA 04/22/16 10:24 AM   [facility] [address]  [place], [place], [address] Phone: [phone]   Fax:  [phone]  Name: [name] MRN: [id number] Date of Birth: [birth year]

## 2016-04-23 ENCOUNTER — Encounter (HOSPITAL_COMMUNITY): Payer: Self-pay

## 2016-04-23 ENCOUNTER — Emergency Department (HOSPITAL_COMMUNITY): Payer: BLUE CROSS/BLUE SHIELD

## 2016-04-23 ENCOUNTER — Emergency Department (HOSPITAL_COMMUNITY)
Admission: EM | Admit: 2016-04-23 | Discharge: 2016-04-23 | Disposition: A | Discharge status: Home / Self Care | Payer: BLUE CROSS/BLUE SHIELD | Attending: Emergency Medicine | Admitting: Emergency Medicine

## 2016-04-23 DIAGNOSIS — Z8673 Personal history of transient ischemic attack (TIA), and cerebral infarction without residual deficits: Secondary | ICD-10-CM | POA: Diagnosis not present

## 2016-04-23 DIAGNOSIS — R531 Weakness: Secondary | ICD-10-CM | POA: Diagnosis present

## 2016-04-23 DIAGNOSIS — Z951 Presence of aortocoronary bypass graft: Secondary | ICD-10-CM | POA: Diagnosis not present

## 2016-04-23 DIAGNOSIS — R471 Dysarthria and anarthria: Secondary | ICD-10-CM | POA: Diagnosis not present

## 2016-04-23 DIAGNOSIS — Z7901 Long term (current) use of anticoagulants: Secondary | ICD-10-CM | POA: Insufficient documentation

## 2016-04-23 DIAGNOSIS — E1140 Type 2 diabetes mellitus with diabetic neuropathy, unspecified: Secondary | ICD-10-CM | POA: Diagnosis not present

## 2016-04-23 DIAGNOSIS — Z85828 Personal history of other malignant neoplasm of skin: Secondary | ICD-10-CM | POA: Diagnosis not present

## 2016-04-23 DIAGNOSIS — I252 Old myocardial infarction: Secondary | ICD-10-CM | POA: Insufficient documentation

## 2016-04-23 DIAGNOSIS — Z794 Long term (current) use of insulin: Secondary | ICD-10-CM | POA: Diagnosis not present

## 2016-04-23 DIAGNOSIS — I10 Essential (primary) hypertension: Secondary | ICD-10-CM | POA: Insufficient documentation

## 2016-04-23 DIAGNOSIS — I2510 Atherosclerotic heart disease of native coronary artery without angina pectoris: Secondary | ICD-10-CM | POA: Insufficient documentation

## 2016-04-23 DIAGNOSIS — G459 Transient cerebral ischemic attack, unspecified: Secondary | ICD-10-CM | POA: Diagnosis not present

## 2016-04-23 LAB — DIFFERENTIAL
BASOS ABS: 0 10*3/uL (ref 0.0–0.1)
BASOS PCT: 0 %
EOS ABS: 0.2 10*3/uL (ref 0.0–0.7)
Eosinophils Relative: 2 %
Lymphocytes Relative: 24 %
Lymphs Abs: 1.9 10*3/uL (ref 0.7–4.0)
Monocytes Absolute: 0.7 10*3/uL (ref 0.1–1.0)
Monocytes Relative: 9 %
NEUTROS ABS: 5.3 10*3/uL (ref 1.7–7.7)
Neutrophils Relative %: 65 %

## 2016-04-23 LAB — COMPREHENSIVE METABOLIC PANEL
ALBUMIN: 3.7 g/dL (ref 3.5–5.0)
ALT: 16 U/L — ABNORMAL LOW (ref 17–63)
AST: 19 U/L (ref 15–41)
Alkaline Phosphatase: 46 U/L (ref 38–126)
Anion gap: 8 (ref 5–15)
BUN: 16 mg/dL (ref 6–20)
CHLORIDE: 106 mmol/L (ref 101–111)
CO2: 24 mmol/L (ref 22–32)
Calcium: 9.5 mg/dL (ref 8.9–10.3)
Creatinine, Ser: 1.02 mg/dL (ref 0.61–1.24)
GFR calc Af Amer: >60 mL/min (ref >60)
GFR calc non Af Amer: >60 mL/min (ref >60)
GLUCOSE: 223 mg/dL — AB (ref 65–99)
POTASSIUM: 4.2 mmol/L (ref 3.5–5.1)
SODIUM: 138 mmol/L (ref 135–145)
Total Bilirubin: 0.5 mg/dL (ref 0.3–1.2)
Total Protein: 6.8 g/dL (ref 6.5–8.1)

## 2016-04-23 LAB — I-STAT TROPONIN, ED: TROPONIN I, POC: 0.01 ng/mL (ref 0.00–0.08)

## 2016-04-23 LAB — I-STAT CHEM 8, ED
BUN: 21 mg/dL — AB (ref 6–20)
Calcium, Ion: 1.2 mmol/L (ref 1.15–1.40)
Chloride: 107 mmol/L (ref 101–111)
Creatinine, Ser: 1 mg/dL (ref 0.61–1.24)
Glucose, Bld: 225 mg/dL — ABNORMAL HIGH (ref 65–99)
HEMATOCRIT: 39 % (ref 39.0–52.0)
Hemoglobin: 13.3 g/dL (ref 13.0–17.0)
POTASSIUM: 4.2 mmol/L (ref 3.5–5.1)
SODIUM: 140 mmol/L (ref 135–145)
TCO2: 26 mmol/L (ref 0–100)

## 2016-04-23 LAB — CBC
HCT: 39.6 % (ref 39.0–52.0)
Hemoglobin: 12.8 g/dL — ABNORMAL LOW (ref 13.0–17.0)
MCH: 28.1 pg (ref 26.0–34.0)
MCHC: 32.3 g/dL (ref 30.0–36.0)
MCV: 87 fL (ref 78.0–100.0)
PLATELETS: 309 10*3/uL (ref 150–400)
RBC: 4.55 MIL/uL (ref 4.22–5.81)
RDW: 13.2 % (ref 11.5–15.5)
WBC: 8.1 10*3/uL (ref 4.0–10.5)

## 2016-04-23 LAB — PROTIME-INR
INR: 1.11
PROTHROMBIN TIME: 14.3 s (ref 11.4–15.2)

## 2016-04-23 LAB — APTT: APTT: 34 s (ref 24–36)

## 2016-04-23 LAB — CBG MONITORING, ED: Glucose-Capillary: 220 mg/dL — ABNORMAL HIGH (ref 65–99)

## 2016-04-23 MED ORDER — ASPIRIN 81 MG PO CHEW
324.0000 mg | CHEWABLE_TABLET | Freq: Once | ORAL | Status: DC
  Filled 2016-04-23: qty 4

## 2016-04-23 MED ORDER — LORAZEPAM 2 MG/ML IJ SOLN
1.0000 mg | Freq: Once | INTRAMUSCULAR | Status: AC
  Administered 2016-04-23: 1 mg via INTRAVENOUS
  Filled 2016-04-23: qty 1

## 2016-04-23 NOTE — ED Notes (Signed)
Patient transported to MRI 

## 2016-04-23 NOTE — ED Notes (Signed)
MD Tomi Bamberger gave verbal order for aspirin PO.

## 2016-04-23 NOTE — ED Triage Notes (Signed)
Patient here with daughter who states that he awoke with weakness and increased slurred speech. Has had strokes in past and last being in July with some residual weakness. Also states that he fell this am, alert and oriented, obvious slurred speech, denies pain.

## 2016-04-23 NOTE — ED Notes (Signed)
RN called speech therapy to discuss swallow screen situation. Waiting for call back from therapist.

## 2016-04-23 NOTE — ED Notes (Signed)
MD at bedside. 

## 2016-04-23 NOTE — Discharge Instructions (Signed)
Follow up with Dr Leonie Man as we discussed.  The speech therapist was going to contact Dr Leonie Man about a swallowing evaluation

## 2016-04-23 NOTE — ED Notes (Signed)
NOT A CODE STROKE

## 2016-04-23 NOTE — Evaluation (Signed)
Clinical/Bedside Swallow Evaluation Patient Details  Name: [name] MRN: [id number] Date of Birth: 12-19-1955  Today's Date: 04/23/2016 Time: SLP Start Time (ACUTE ONLY): 1500 SLP Stop Time (ACUTE ONLY): 1523 SLP Time Calculation (min) (ACUTE ONLY): 23 min  Past Medical History:  Past Medical History:  Diagnosis Date  . Arthritis    "hx; cleaned it out of both shoulders"  . CAD (coronary artery disease)    OV, Dr Harlow Asa, MYOVIEW 5/12 on chart  EKG 10/12 EPIC,  chest x ray 01/07/11 EPIC  . Carpal tunnel syndrome    peripheral neuropathy  . Chronic shoulder pain    "both"  . DVT (deep venous thrombosis) (HCC)    hx LLE  . History of kidney stones   . Hyperlipidemia   . Hypertension   . Myocardial infarction 02/2001  . Neuropathy, peripheral (HCC)    both feet  . Peripheral vascular disease, unspecified 03/2015   PCI to the right popliteal  . Skin cancer    "have had them cut or burned off my face" (03/28/2015)  . Stroke ([place]) 10/10/2015  . Type II diabetes mellitus ([place])    Past Surgical History:  Past Surgical History:  Procedure Laterality Date  . APPENDECTOMY  1977  . CARDIAC CATHETERIZATION  2002      . CARPAL TUNNEL RELEASE Right 2000's  . CARPAL TUNNEL RELEASE  12/18/2011   Procedure: CARPAL TUNNEL RELEASE;  Surgeon: Mcarthur Rossetti, MD;  [facility];  Service: Orthopedics;  Laterality: Left;  Left Open Carpal Tunnel Release  . CORONARY ANGIOPLASTY    . CORONARY ARTERY BYPASS GRAFT  2002   CABG X 4  . CYSTOSCOPY  several done in past  . FEMORAL-TIBIAL BYPASS GRAFT Left 01/07/11   fem-posterior tibial BPG using reversed left GSV               12/15/11 OK BY DR Ninfa Linden TO CONTINUE ASA AND PLAVIX  . FEMORAL-TIBIAL BYPASS GRAFT Left 09/06/2015   Procedure: LEFT FEMORAL-POSTERIOR TIBIAL ARTERY BYPASS GRAFT WITH COMPOSITE PTFE AND RIGHT ARM VEIN;  Surgeon: Elam Dutch, MD;  Location: [facility];  Service: Vascular;  Laterality: Left;  . FEMOROPOPLITEAL  THROMBECTOMY / EMBOLECTOMY  ~ 2010  . FRACTURE SURGERY    . IR GENERIC HISTORICAL  02/18/2016   IR RADIOLOGIST EVAL & MGMT 02/18/2016 [facility]  . KNEE ARTHROSCOPY Left X 2  . LAPAROSCOPIC CHOLECYSTECTOMY  1990's  . LITHOTRIPSY  several done in past  . ORIF RADIUS & ULNA FRACTURES Left   . PERIPHERAL VASCULAR CATHETERIZATION N/A 03/28/2015   Procedure: Lower Extremity Angiography;  Surgeon: Lorretta Harp, MD;  Location: [facility];  Service: Cardiovascular;  Laterality: N/A;  . PERIPHERAL VASCULAR CATHETERIZATION Right 03/28/2015   Procedure: Peripheral Vascular Atherectomy;  Surgeon: Lorretta Harp, MD;  Location: [facility];  Service: Cardiovascular;  Laterality: Right;  popliteal;   . PERIPHERAL VASCULAR CATHETERIZATION N/A 08/23/2015   Procedure: Abdominal Aortogram;  Surgeon: Elam Dutch, MD;  Location: [facility];  Service: Cardiovascular;  Laterality: N/A;  . POPLITEAL ARTERY STENT Left 2010-2012 X 4  . RADIOLOGY WITH ANESTHESIA N/A 02/04/2016   Procedure: Basilar artery angioplasty with stenting;  Surgeon: Luanne Bras, MD;  Location: [facility];  Service: Radiology;  Laterality: N/A;  . SHOULDER ARTHROSCOPY Left   . SHOULDER ARTHROSCOPY Right 12/18/2011  . SHOULDER ARTHROSCOPY  07/08/2012   Procedure: ARTHROSCOPY SHOULDER;  Surgeon: Mcarthur Rossetti, MD;  [facility];  Service: Orthopedics;  Laterality: Left;  Left Shoulder Arthroscopy with Manipulation and Extensive Debridement  . SHOULDER ARTHROSCOPY WITH ROTATOR CUFF REPAIR Left 07/14/2013   Procedure: LEFT SHOULDER ARTHROSCOPY WITH EXTENSIVE DEBRIDEMENT, DISTAL CLAVICLE REPAIR;  Surgeon: Mcarthur Rossetti, MD;  [facility];  Service: Orthopedics;  Laterality: Left;  . SKIN CANCER EXCISION     "left side of my forehead"  . VEIN HARVEST Right 09/06/2015   Procedure: RIGHT ARM VEIN HARVEST;  Surgeon: Elam Dutch, MD;  Location: [facility];  Service: Vascular;  Laterality: Right;    HPI:  [name] an 60 y.o.malewho presented to [facility] hospital on July 2017 and found to have a Pontine stroke. At that time patient's main symptoms was a aphasia. On the night after admission he developed worsening neurological status with dysarthria and dysphagia and right facial droop as well as right-sided weakness. Repeat CT scan showed no acute abnormality. Pt readmitted 10/5 secondary to having transient symptoms of worsening dysarthria, left arm weakness, and feeling as if he had increased bilateral lower extremity weakness. By the time he was evaluated by neurology in ED, symptoms had resolved. CT without evidence of acute changes, MRI pending. Evaluated at bedside during previous admission and patient was recommended for a regular diet, thin liquid with aspiration precautions (chin tuck) due to s/s of aspiration noted at bedside.    Assessment / Plan / Recommendation Clinical Impression  Bedside swallow evaluation complete. Patient presents with mild appearing dysphagia characterized by s/s of aspiration with thin liquids consistent with bedside swallow evaluation complete in July s/p pontine CVA. At that time, if s/s of aspiration persisted, FEES was recommended (given body habitus) however per patient, no SLP f/u was received. Patient declined trials of thickened liquids today, denies difficulty with solids despite presentation, however agreeable to instrumental study. Admission is pending MRI results. If patient is admitted, will complete FEES 10/6 as inpatient. If discharged home, recommend OP FEES. Per ER MD, neuro needs to order. Paged neuro without return call. Text sent. Recommend continuation of regular diet as patient appears at baseline since previous CVA and has been tolerating from a pulmonary standpoint.     Aspiration Risk  Moderate aspiration risk    Diet Recommendation Regular;Thin liquid   Liquid Administration via: Cup;No straw Medication Administration: Whole meds  with puree Supervision: Patient able to self feed;Intermittent supervision to cue for compensatory strategies Compensations: Slow rate;Small sips/bites Postural Changes: Seated upright at 90 degrees    Other  Recommendations Oral Care Recommendations: Oral care BID   Follow up Recommendations Outpatient SLP      Frequency and Duration            Prognosis        Swallow Study   General HPI: [name] an 60 y.o.malewho presented to [facility] hospital on July 2017 and found to have a Pontine stroke. At that time patient's main symptoms was a aphasia. On the night after admission he developed worsening neurological status with dysarthria and dysphagia and right facial droop as well as right-sided weakness. Repeat CT scan showed no acute abnormality. Pt readmitted 10/5 secondary to having transient symptoms of worsening dysarthria, left arm weakness, and feeling as if he had increased bilateral lower extremity weakness. By the time he was evaluated by neurology in ED, symptoms had resolved. CT without evidence of acute changes, MRI pending. Evaluated at bedside during previous admission and patient was recommended for a regular diet, thin liquid  with aspiration precautions (chin tuck) due to s/s of aspiration noted at bedside.  Type of Study: Bedside Swallow Evaluation Previous Swallow Assessment: see HPI Diet Prior to this Study: NPO (regular, thin prior to admission) Temperature Spikes Noted: No Respiratory Status: Room air History of Recent Intubation: No Behavior/Cognition: Alert;Cooperative;Pleasant mood Oral Cavity Assessment: Within Functional Limits Oral Care Completed by SLP: No Oral Cavity - Dentition: Adequate natural dentition Vision: Functional for self-feeding Self-Feeding Abilities: Able to feed self Patient Positioning: Upright in bed Baseline Vocal Quality: Normal Volitional Cough: Strong Volitional Swallow: Able to elicit    Oral/Motor/Sensory Function Overall  Oral Motor/Sensory Function:  (appears WFL despite mild-mod dysarthria)   Ice Chips Ice chips: Not tested   Thin Liquid Thin Liquid: Impaired Presentation: Straw;Cup;Self Fed Pharyngeal  Phase Impairments: Cough - Immediate    Nectar Thick Nectar Thick Liquid: Not tested   Honey Thick Honey Thick Liquid: Not tested   Puree Puree: Within functional limits Presentation: Self Fed;Spoon   Solid   GO   Solid: Within functional limits Presentation: Self Fed    Functional Assessment Tool Used: skilled clinical judgement Functional Limitations: Swallowing Swallow Current Status [id number]): At least 20 percent but less than 40 percent impaired, limited or restricted Swallow Goal Status [phone]): At least 1 percent but less than 20 percent impaired, limited or restricted  Gabriel Rainwater MA, CCC-SLP [phone]  Kristin Barcus Meryl 04/23/2016,3:33 PM

## 2016-04-23 NOTE — ED Notes (Signed)
Speech at bedside to evaluate.

## 2016-04-23 NOTE — ED Provider Notes (Signed)
[facility] DEPT Provider Note   CSN: [id number] Arrival date & time: 04/23/16  1059     History   Chief Complaint Chief Complaint  Patient presents with  . Weakness    HPI [name] is a 60 y.o. male.  HPI Pt woke up this am and noticed he was feeling weak in his legs.  He was going to the bathroom trying to get ready and he noticed he was having trouble.  He dropped something he was holding in his hand. He barely could pick up his phone.  He fell to the ground but was eventually able to get himself up. His speech is also more slurred than usual.  He does have a history of stroke back in July.  Sx do seem to be better than a couple of hours ago.  Speech is better as well. Overall strength is better now too.   Past Medical History:  Diagnosis Date  . Arthritis    "hx; cleaned it out of both shoulders"  . CAD (coronary artery disease)    OV, Dr Harlow Asa, [facility] 5/12 on chart  EKG 10/12 EPIC,  chest x ray 01/07/11 EPIC  . Carpal tunnel syndrome    peripheral neuropathy  . Chronic shoulder pain    "both"  . DVT (deep venous thrombosis) (HCC)    hx LLE  . History of kidney stones   . Hyperlipidemia   . Hypertension   . Myocardial infarction 02/2001  . Neuropathy, peripheral (HCC)    both feet  . Peripheral vascular disease, unspecified 03/2015   PCI to the right popliteal  . Skin cancer    "have had them cut or burned off my face" (03/28/2015)  . Stroke ([place]) 10/10/2015  . Type II diabetes mellitus [facility])     Patient Active Problem List   Diagnosis Date Noted  . Basilar artery stenosis, infarct within 8 weeks 02/04/2016  . Paroxysmal atrial fibrillation ([place]) 01/30/2016  . Left pontine CVA ([place])   . Left pontine stroke ([place]) 01/29/2016  . Stroke (cerebrum) ([place]) 01/29/2016  . Acute CVA (cerebrovascular accident) ([place]) 01/29/2016  . Occlusion and stenosis of basilar artery   . Hypertriglyceridemia 10/12/2015  . Uncontrolled diabetes mellitus with hyperglycemia,  with long-term current use of insulin ([place]) 10/12/2015  . Acute ischemic stroke ([place]) 10/10/2015  . Diabetes type 2, controlled ([place]) 10/10/2015  . CVA (cerebral infarction) 10/10/2015  . Claudication ([place]) 03/28/2015  . Dyspnea on exertion 02/22/2015  . Pain in joint, lower leg 12/13/2014  . Cramp of both lower extremities 12/13/2014  . Essential hypertension 08/01/2013  . Hyperlipidemia 08/01/2013  . Coronary artery disease, history of CABG 08/01/2013  . Incomplete rotator cuff tear left shoulder 07/14/2013  . S/P arthroscopy of shoulder 07/14/2013  . Aftercare following surgery of the circulatory system, [place] 11/10/2012  . PVD (peripheral vascular disease) ([place]) 04/26/2012  . 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 Carpal tunnel syndrome of left wrist 12/18/2011  . Peripheral vascular disease, unspecified 07/16/2011  . PAD (peripheral artery disease) ([place]) 04/23/2011    Past Surgical History:  Procedure Laterality Date  . APPENDECTOMY  1977  . CARDIAC CATHETERIZATION  2002      . CARPAL TUNNEL RELEASE Right 2000's  . CARPAL TUNNEL RELEASE  12/18/2011   Procedure: CARPAL TUNNEL RELEASE;  Surgeon: Mcarthur Rossetti, MD;  Location: [facility];  Service: Orthopedics;  Laterality: Left;  Left Open Carpal Tunnel Release  . CORONARY ANGIOPLASTY    . CORONARY ARTERY  BYPASS GRAFT  2002   CABG X 4  . CYSTOSCOPY  several done in past  . FEMORAL-TIBIAL BYPASS GRAFT Left 01/07/11   fem-posterior tibial BPG using reversed left GSV               12/15/11 OK BY DR Ninfa Linden TO CONTINUE ASA AND PLAVIX  . FEMORAL-TIBIAL BYPASS GRAFT Left 09/06/2015   Procedure: LEFT FEMORAL-POSTERIOR TIBIAL ARTERY BYPASS GRAFT WITH COMPOSITE PTFE AND RIGHT ARM VEIN;  Surgeon: Elam Dutch, MD;  Location: [facility];  Service: Vascular;  Laterality: Left;  . FEMOROPOPLITEAL THROMBECTOMY / EMBOLECTOMY  ~ 2010  . FRACTURE SURGERY    . IR GENERIC HISTORICAL  02/18/2016   IR RADIOLOGIST EVAL & MGMT 02/18/2016 [facility]-INTERV RAD   . KNEE ARTHROSCOPY Left X 2  . LAPAROSCOPIC CHOLECYSTECTOMY  1990's  . LITHOTRIPSY  several done in past  . ORIF RADIUS & ULNA FRACTURES Left   . PERIPHERAL VASCULAR CATHETERIZATION N/A 03/28/2015   Procedure: Lower Extremity Angiography;  Surgeon: Lorretta Harp, MD;  Location: [facility];  Service: Cardiovascular;  Laterality: N/A;  . PERIPHERAL VASCULAR CATHETERIZATION Right 03/28/2015   Procedure: Peripheral Vascular Atherectomy;  Surgeon: Lorretta Harp, MD;  Location: [facility];  Service: Cardiovascular;  Laterality: Right;  popliteal;   . PERIPHERAL VASCULAR CATHETERIZATION N/A 08/23/2015   Procedure: Abdominal Aortogram;  Surgeon: Elam Dutch, MD;  Location: [facility];  Service: Cardiovascular;  Laterality: N/A;  . POPLITEAL ARTERY STENT Left 2010-2012 X 4  . RADIOLOGY WITH ANESTHESIA N/A 02/04/2016   Procedure: Basilar artery angioplasty with stenting;  Surgeon: Luanne Bras, MD;  Location: [facility];  Service: Radiology;  Laterality: N/A;  . SHOULDER ARTHROSCOPY Left   . SHOULDER ARTHROSCOPY Right 12/18/2011  . SHOULDER ARTHROSCOPY  07/08/2012   Procedure: ARTHROSCOPY SHOULDER;  Surgeon: Mcarthur Rossetti, MD;  Location: [facility];  Service: Orthopedics;  Laterality: Left;  Left Shoulder Arthroscopy with Manipulation and Extensive Debridement  . SHOULDER ARTHROSCOPY WITH ROTATOR CUFF REPAIR Left 07/14/2013   Procedure: LEFT SHOULDER ARTHROSCOPY WITH EXTENSIVE DEBRIDEMENT, DISTAL CLAVICLE REPAIR;  Surgeon: Mcarthur Rossetti, MD;  Location: [facility];  Service: Orthopedics;  Laterality: Left;  . SKIN CANCER EXCISION     "left side of my forehead"  . VEIN HARVEST Right 09/06/2015   Procedure: RIGHT ARM VEIN HARVEST;  Surgeon: Elam Dutch, MD;  Location: [facility];  Service: Vascular;  Laterality: Right;       Home Medications    Prior to Admission medications   Medication Sig Start Date End Date Taking? Authorizing Provider  apixaban (ELIQUIS)  5 MG TABS tablet Take 1 tablet (5 mg total) by mouth 2 (two) times daily. 02/05/16  Yes Donzetta Starch, NP  atorvastatin (LIPITOR) 40 MG tablet Take 40 mg by mouth daily.   Yes Historical Provider, MD  clopidogrel (PLAVIX) 75 MG tablet TAKE 1 TABLET BY MOUTH EVERY DAY 09/20/15  Yes Lorretta Harp, MD  fenofibrate (TRICOR) 145 MG tablet TAKE 1 TABLET (145 MG TOTAL) BY MOUTH DAILY. 02/25/16  Yes Lorretta Harp, MD  gabapentin (NEURONTIN) 300 MG capsule Take 300-600 mg by mouth 2 (two) times daily. Take 600mg  in the morning and 300mg  in the evening.   Yes Historical Provider, MD  insulin aspart (NOVOLOG) 100 UNIT/ML injection Inject 40 Units into the skin 3 (three) times daily before meals. 10/12/15  Yes Nishant Dhungel, MD  JARDIANCE 10 MG TABS tablet Take 10 mg  by mouth daily. 04/12/16  Yes Historical Provider, MD  LEVEMIR FLEXTOUCH 100 UNIT/ML Pen Inject 80 Units into the skin 2 (two) times daily. Reported on 08/21/2015 Patient taking differently: Inject 75 Units into the skin 2 (two) times daily. Reported on 08/21/2015 10/12/15  Yes Nishant Dhungel, MD  lisinopril-hydrochlorothiazide (PRINZIDE,ZESTORETIC) 20-12.5 MG per tablet Take 1 tablet by mouth daily with breakfast.    Yes Historical Provider, MD  metFORMIN (GLUCOPHAGE-XR) 500 MG 24 hr tablet Take 1 tablet (500 mg total) by mouth 2 (two) times daily. 02/01/16  Yes Modena Jansky, MD  metoprolol succinate (TOPROL-XL) 25 MG 24 hr tablet Take 1 tablet (25 mg total) by mouth daily. 03/24/16  Yes Lorretta Harp, MD  atorvastatin (LIPITOR) 80 MG tablet Take 1 tablet (80 mg total) by mouth daily. Patient not taking: Reported on 04/23/2016 10/12/15   Louellen Molder, MD    Family History Family History  Problem Relation Age of Onset  . Cancer Mother     Breast and Brain tumor  . Cancer Father     Blood vessel tumor    Social History Social History  Substance Use Topics  . Smoking status: Never Smoker  . Smokeless tobacco: Never Used  . Alcohol use  No     Allergies   Review of patient's allergies indicates no known allergies.   Review of Systems Review of Systems  Constitutional: Negative for fever.  Respiratory: Negative for shortness of breath.   Cardiovascular: Negative for chest pain.  Gastrointestinal: Negative for abdominal pain.  Skin: Negative for rash.  Neurological: Negative for headaches.  All other systems reviewed and are negative.    Physical Exam Updated Vital Signs BP 122/80   Pulse 78   Temp 98.7 F (37.1 C)   Resp 16   Ht 5\' 11"  (1.803 m)   Wt 110.2 kg   SpO2 98%   BMI 33.89 kg/m   Physical Exam  Constitutional: He is oriented to person, place, and time. He appears well-developed and well-nourished. No distress.  HENT:  Head: Normocephalic and atraumatic.  Right Ear: External ear normal.  Left Ear: External ear normal.  Mouth/Throat: Oropharynx is clear and moist.  Eyes: Conjunctivae are normal. Right eye exhibits no discharge. Left eye exhibits no discharge. No scleral icterus.  Neck: Neck supple. No tracheal deviation present.  Cardiovascular: Normal rate, regular rhythm and intact distal pulses.   Pulmonary/Chest: Effort normal and breath sounds normal. No stridor. No respiratory distress. He has no wheezes. He has no rales.  Abdominal: Soft. Bowel sounds are normal. He exhibits no distension. There is no tenderness. There is no rebound and no guarding.  Musculoskeletal: He exhibits no edema or tenderness.  Neurological: He is alert and oriented to person, place, and time. He has normal strength. No cranial nerve deficit (No facial droop, extraocular movements intact, tongue midline  , speech slurred) or sensory deficit. He exhibits normal muscle tone. He displays no seizure activity. Coordination normal.  No pronator drift bilateral upper extrem, able to hold both legs off bed for 5 seconds, sensation intact in all extremities, no visual field cuts, no left or right sided neglect, normal  finger-nose exam bilaterally, no nystagmus noted   Skin: Skin is warm and dry. No rash noted.  Psychiatric: He has a normal mood and affect.  Nursing note and vitals reviewed.    ED Treatments / Results  Labs (all labs ordered are listed, but only abnormal results are displayed) Labs Reviewed  CBC -  Abnormal; Notable for the following:       Result Value   Hemoglobin 12.8 (*)    All other components within normal limits  COMPREHENSIVE METABOLIC PANEL - Abnormal; Notable for the following:    Glucose, Bld 223 (*)    ALT 16 (*)    All other components within normal limits  CBG MONITORING, ED - Abnormal; Notable for the following:    Glucose-Capillary 220 (*)    All other components within normal limits  I-STAT CHEM 8, ED - Abnormal; Notable for the following:    BUN 21 (*)    Glucose, Bld 225 (*)    All other components within normal limits  PROTIME-INR  APTT  DIFFERENTIAL  I-STAT TROPOININ, ED    EKG  EKG Interpretation  Date/Time:  Thursday April 23 2016 11:03:56 EDT Ventricular Rate:  84 PR Interval:  204 QRS Duration: 88 QT Interval:  390 QTC Calculation: 460 R Axis:   7 Text Interpretation:  Normal sinus rhythm Low voltage QRS Cannot rule out Anterior infarct , age undetermined Abnormal ECG No significant change since last tracing Confirmed by Loralee Weitzman  MD-J, Lola Czerwonka [phone]) on 04/23/2016 3:54:00 PM       Radiology Ct Head Wo Contrast  Result Date: 04/23/2016 CLINICAL DATA:  Pt fell forward today, pt is having general weakness, pt did not hit his head, no LOC, slurred speech, pt has had prior strokes in march and June this year EXAM: CT HEAD WITHOUT CONTRAST TECHNIQUE: Contiguous axial images were obtained from the base of the skull through the vertex without intravenous contrast. COMPARISON:  01/29/2016 FINDINGS: Brain: No evidence of acute infarction, hemorrhage, hydrocephalus, extra-axial collection or mass lesion/mass effect. Small lacune infarct in the deep white  matter of the posterior left frontal lobe, with another along the right caudate nucleus head, both stable from the prior study. Vascular: Vascular stent has been placed in the basilar artery. No hyperdense vessels. Skull: Normal. Negative for fracture or focal lesion. Sinuses/Orbits: Minor mucosal thickening along the floor of the right maxillary sinus. Sinuses otherwise clear. Globes and orbits are unremarkable. Other: Clear mastoid air cells. IMPRESSION: 1. No acute intracranial abnormalities. 2. Vascular stent noted in the basilar artery new since the prior CT. No other change. Electronically Signed   By: Lajean Manes M.D.   On: 04/23/2016 11:33   Mr Brain Wo Contrast  Result Date: 04/23/2016 CLINICAL DATA:  60 y/o M; history of pontine stroke July 2017 and readmitted 04/23/2016 for transient symptoms of worsening dysarthria, left arm weakness, and lower extremity weakness. EXAM: MRI HEAD WITHOUT CONTRAST TECHNIQUE: Multiplanar, multiecho pulse sequences of the brain and surrounding structures were obtained without intravenous contrast. COMPARISON:  04/23/2016 CT head.  01/29/2016 MRI brain. FINDINGS: Brain: No diffusion restriction to suggest acute or early subacute infarct. Encephalomalacia within the left anterior pons from prior infarct. Increased T2 signal within bilateral middle cerebral peduncle, possibly wallerian degeneration. Stable moderate chronic microvascular ischemic changes of white matter and mild parenchymal volume loss. Stable T2 hyperintense foci in the bilateral basal ganglia may represent old lacunar infarcts or prominent perivascular spaces. No new susceptibility hypointensity to indicate interval intracranial hemorrhage. No extra-axial collection or hydrocephalus. No focal mass effect. 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 Mid to distal basilar susceptibility artifact from stenting. Skull and upper cervical spine: Normal marrow signal.  Sinuses/Orbits: Right maxillary and anterior ethmoid sinus mucosal thickening. No abnormal signal of mastoid air cells. Orbits are unremarkable. Other: None.  IMPRESSION: 1. No acute intracranial abnormality is identified. 2. Left pontine encephalomalacia consistent with prior infarct. Increased signal within the middle cerebral peduncle may represent associated wallerian degeneration. 3. Background of chronic microvascular ischemic changes and parenchymal volume loss is stable. 4. 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 Basilar stent. Electronically Signed   By: Kristine Garbe M.D.   On: 04/23/2016 16:37    Procedures Procedures (including critical care time)  Medications Ordered in ED Medications  LORazepam (ATIVAN) injection 1 mg (1 mg Intravenous Given 04/23/16 1549)  given for the MRI   Initial Impression / Assessment and Plan / ED Course  I have reviewed the triage vital signs and the nursing notes.  Pertinent labs & imaging results that were available during my care of the patient were reviewed by me and considered in my medical decision making (see chart for details).  Clinical Course  Comment By Time  Discussed with neurology who will see the patient in the ED Dorie Rank, MD 10/05 1302  Pt did take his plavix and asa this am Dorie Rank, MD 10/05 1330  Pt seen by neurology.  Plan is for MRI of the brain.  If negative, pt can be discharged home.  Pt is comfortable with this plan because he would prefer to go home.    Dorie Rank, MD 10/05 1330    MRI without acute findings.  No acute stroke.  Pt is stable for discharge.  Speech therapy evaluated the patient while he was in the ED.  Pt did not complete his evaluation as an outpatient.  Plan is to contact Dr Leonie Man to arrange for outpatient evaluation.  Final Clinical Impressions(s) / ED Diagnoses   Final diagnoses:  Weakness    New Prescriptions New Prescriptions   No medications on file       Dorie Rank, MD 04/23/16 1651

## 2016-04-23 NOTE — ED Notes (Addendum)
RN discussed with Dr. Gifford Shave that even though pt has hx of dysphagia but swallow screen performed and no issues- no problems with swallowing and thus can eat and drink.

## 2016-04-23 NOTE — Consult Note (Signed)
Requesting Physician: Dr. Tomi Bamberger    Chief Complaint: bil LE weakness and L arm weakness    HPI:                                                                                                                                         [name] is an 60 y.o. male who presented to [facility] hospital on July 2017 and found to have a [place] stroke. At that time patient's main symptoms was a aphasia. She was beyond the TPA window during that hospital visit.  [name] admitted for further evaluation and treatment. On the night after admission he developed worsening neurological status with dysarthria and dysphagia and right facial droop as well as right-sided weakness. Repeat CT scan showed no acute abnormality. 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 Patient had known evidence of posterior circulation disease from previous admission in March 2017. 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 He was seen by Dr. Estanislado Pandy and plan was to do elective basilar artery stenting. He returned a week later and underwent stenting by Dr. Estanislado Pandy on 02/04/16 which was uneventful. Patient postprocedure developed new-onset atrial fibrillation and was started on eliquis and Plavix was continued for the stent. Patient was brought back to the hospital today secondary to having transient symptoms of worsening dysarthria, left arm weakness, and feeling as if he had increased bilateral lower extremity weakness. At time of consultation all symptoms had resolved.  Date last known well: Date: 04/22/2016 Time last known well: Unable to determine tPA Given: No: resolved symptoms   Past Medical History:  Diagnosis Date  . Arthritis    "hx; cleaned it out of both shoulders"  . CAD (coronary artery disease)    OV, Dr Harlow Asa, [facility] 5/12 on chart  EKG 10/12 EPIC,  chest x ray 01/07/11 EPIC  . Carpal tunnel syndrome    peripheral  neuropathy  . Chronic shoulder pain    "both"  . DVT (deep venous thrombosis) (HCC)    hx LLE  . History of kidney stones   . Hyperlipidemia   . Hypertension   . Myocardial infarction 02/2001  . Neuropathy, peripheral (HCC)    both feet  . Peripheral vascular disease, unspecified 03/2015   PCI to the right popliteal  . Skin cancer    "have had them cut or burned off my face" (03/28/2015)  . Stroke ([place]) 10/10/2015  . Type II diabetes mellitus ([place])     Past Surgical History:  Procedure Laterality Date  . APPENDECTOMY  1977  . CARDIAC CATHETERIZATION  2002      . CARPAL TUNNEL RELEASE Right 2000's  . CARPAL TUNNEL RELEASE  12/18/2011   Procedure: CARPAL TUNNEL RELEASE;  Surgeon: Mcarthur Rossetti, MD;  Location: [facility];  Service: Orthopedics;  Laterality: Left;  Left Open Carpal  Tunnel Release  . CORONARY ANGIOPLASTY    . CORONARY ARTERY BYPASS GRAFT  2002   CABG X 4  . CYSTOSCOPY  several done in past  . FEMORAL-TIBIAL BYPASS GRAFT Left 01/07/11   fem-posterior tibial BPG using reversed left GSV               12/15/11 OK BY DR Ninfa Linden TO CONTINUE ASA AND PLAVIX  . FEMORAL-TIBIAL BYPASS GRAFT Left 09/06/2015   Procedure: LEFT FEMORAL-POSTERIOR TIBIAL ARTERY BYPASS GRAFT WITH COMPOSITE PTFE AND RIGHT ARM VEIN;  Surgeon: Elam Dutch, MD;  Location: [place];  Service: Vascular;  Laterality: Left;  . FEMOROPOPLITEAL THROMBECTOMY / EMBOLECTOMY  ~ 2010  . FRACTURE SURGERY    . IR GENERIC HISTORICAL  02/18/2016   IR RADIOLOGIST EVAL & MGMT 02/18/2016 [facility]  . KNEE ARTHROSCOPY Left X 2  . LAPAROSCOPIC CHOLECYSTECTOMY  1990's  . LITHOTRIPSY  several done in past  . ORIF RADIUS & ULNA FRACTURES Left   . PERIPHERAL VASCULAR CATHETERIZATION N/A 03/28/2015   Procedure: Lower Extremity Angiography;  Surgeon: Lorretta Harp, MD;  Location: [place] [facility];  Service: Cardiovascular;  Laterality: N/A;  . PERIPHERAL VASCULAR CATHETERIZATION Right 03/28/2015   Procedure: Peripheral  Vascular Atherectomy;  Surgeon: Lorretta Harp, MD;  Location: [place] [facility];  Service: Cardiovascular;  Laterality: Right;  popliteal;   . PERIPHERAL VASCULAR CATHETERIZATION N/A 08/23/2015   Procedure: Abdominal Aortogram;  Surgeon: Elam Dutch, MD;  Location: [place] [facility];  Service: Cardiovascular;  Laterality: N/A;  . POPLITEAL ARTERY STENT Left 2010-2012 X 4  . RADIOLOGY WITH ANESTHESIA N/A 02/04/2016   Procedure: Basilar artery angioplasty with stenting;  Surgeon: Luanne Bras, MD;  Location: [place];  Service: Radiology;  Laterality: N/A;  . SHOULDER ARTHROSCOPY Left   . SHOULDER ARTHROSCOPY Right 12/18/2011  . SHOULDER ARTHROSCOPY  07/08/2012   Procedure: ARTHROSCOPY SHOULDER;  Surgeon: Mcarthur Rossetti, MD;  Location: [facility];  Service: Orthopedics;  Laterality: Left;  Left Shoulder Arthroscopy with Manipulation and Extensive Debridement  . SHOULDER ARTHROSCOPY WITH ROTATOR CUFF REPAIR Left 07/14/2013   Procedure: LEFT SHOULDER ARTHROSCOPY WITH EXTENSIVE DEBRIDEMENT, DISTAL CLAVICLE REPAIR;  Surgeon: Mcarthur Rossetti, MD;  Location: [facility];  Service: Orthopedics;  Laterality: Left;  . SKIN CANCER EXCISION     "left side of my forehead"  . VEIN HARVEST Right 09/06/2015   Procedure: RIGHT ARM VEIN HARVEST;  Surgeon: Elam Dutch, MD;  Location: [facility];  Service: Vascular;  Laterality: Right;    Family History  Problem Relation Age of Onset  . Cancer Mother     Breast and Brain tumor  . Cancer Father     Blood vessel tumor   Social History:  reports that he has never smoked. He has never used smokeless tobacco. He reports that he does not drink alcohol or use drugs.  Allergies: No Known Allergies  Medications:  Current Facility-Administered Medications  Medication Dose Route Frequency Provider Last Rate Last Dose  . aspirin  chewable tablet 324 mg  324 mg Oral Once Dorie Rank, MD       Current Outpatient Prescriptions  Medication Sig Dispense Refill  . apixaban (ELIQUIS) 5 MG TABS tablet Take 1 tablet (5 mg total) by mouth 2 (two) times daily. 60 tablet 2  . atorvastatin (LIPITOR) 40 MG tablet Take 40 mg by mouth daily.    . clopidogrel (PLAVIX) 75 MG tablet TAKE 1 TABLET BY MOUTH EVERY DAY 90 tablet 3  . fenofibrate (TRICOR) 145 MG tablet TAKE 1 TABLET (145 MG TOTAL) BY MOUTH DAILY. 90 tablet 3  . gabapentin (NEURONTIN) 300 MG capsule Take 300-600 mg by mouth 2 (two) times daily. Take 600mg  in the morning and 300mg  in the evening.    . insulin aspart (NOVOLOG) 100 UNIT/ML injection Inject 40 Units into the skin 3 (three) times daily before meals. 10 mL 11  . JARDIANCE 10 MG TABS tablet Take 10 mg by mouth daily.    [name] LEVEMIR FLEXTOUCH 100 UNIT/ML Pen Inject 80 Units into the skin 2 (two) times daily. Reported on 08/21/2015 (Patient taking differently: Inject 75 Units into the skin 2 (two) times daily. Reported on 08/21/2015) 15 mL 4  . lisinopril-hydrochlorothiazide (PRINZIDE,ZESTORETIC) 20-12.5 MG per tablet Take 1 tablet by mouth daily with breakfast.     . metFORMIN (GLUCOPHAGE-XR) 500 MG 24 hr tablet Take 1 tablet (500 mg total) by mouth 2 (two) times daily.    . metoprolol succinate (TOPROL-XL) 25 MG 24 hr tablet Take 1 tablet (25 mg total) by mouth daily. 90 tablet 3  . atorvastatin (LIPITOR) 80 MG tablet Take 1 tablet (80 mg total) by mouth daily. (Patient not taking: Reported on 04/23/2016) 30 tablet 0     ROS:                                                                                                                                       History obtained from the patient  General ROS: negative for - chills, fatigue, fever, night sweats, weight gain or weight loss Psychological ROS: negative for - behavioral disorder, hallucinations, memory difficulties, mood swings or suicidal ideation Ophthalmic ROS:  negative for - blurry vision, double vision, eye pain or loss of vision ENT ROS: negative for - epistaxis, nasal discharge, oral lesions, sore throat, tinnitus or vertigo Allergy and Immunology ROS: negative for - hives or itchy/watery eyes Hematological and Lymphatic ROS: negative for - bleeding problems, bruising or swollen lymph nodes Endocrine ROS: negative for - galactorrhea, hair pattern changes, polydipsia/polyuria or temperature intolerance Respiratory ROS: negative for - cough, hemoptysis, shortness of breath or wheezing Cardiovascular ROS: negative for - chest pain, dyspnea on exertion, edema or irregular heartbeat Gastrointestinal ROS: negative for - abdominal pain, diarrhea, hematemesis, nausea/vomiting or stool incontinence Genito-Urinary ROS: negative for - dysuria,  hematuria, incontinence or urinary frequency/urgency Musculoskeletal ROS: negative for - joint swelling or muscular weakness Neurological ROS: as noted in HPI Dermatological ROS: negative for rash and skin lesion changes  Neurologic Examination:                                                                                                      Blood pressure 146/74, pulse 80, temperature 98.7 F (37.1 C), resp. rate 14, height 5\' 11"  (1.803 m), weight 110.2 kg (243 lb), SpO2 98 %.  HEENT-  Normocephalic, no lesions, without obvious abnormality.  Normal external eye and conjunctiva.  Normal TM's bilaterally.  Normal auditory canals and external ears. Normal external nose, mucus membranes and septum.  Normal pharynx. Cardiovascular- S1, S2 normal, pulses palpable throughout   Lungs- chest clear, no wheezing, rales, normal symmetric air entry Abdomen- normal findings: bowel sounds normal Extremities- no edema Lymph-no adenopathy palpable Musculoskeletal-no joint tenderness, deformity or swelling Skin-warm and dry, no hyperpigmentation, vitiligo, or suspicious lesions  Neurological Examination Mental Status: Alert,  oriented, thought content appropriate.  Speech dysarthric without evidence of aphasia.  Able to follow 3 step commands without difficulty. Cranial Nerves: II:  Visual fields grossly normal, pupils equal, round, reactive to light and accommodation III,IV, VI: ptosis not present, extra-ocular motions intact bilaterally V,VII: smile symmetric, facial light touch sensation normal bilaterally VIII: hearing normal bilaterally IX,X: uvula rises symmetrically XI: bilateral shoulder shrug XII: midline tongue extension Motor: Right : Upper extremity   5/5    Left:     Upper extremity   5/5  Lower extremity   5/5     Lower extremity   5/5 Tone and bulk:normal tone throughout; no atrophy noted Sensory: Pinprick and light touch intact throughout, bilaterally Deep Tendon Reflexes: 2+ and symmetric throughout with no AJ Plantars: Right: downgoing   Left: downgoing Cerebellar: normal finger-to-nose Gait: not tested       Lab Results: Basic Metabolic Panel:  Recent Labs Lab 04/23/16 1108 04/23/16 1158  NA 138 140  K 4.2 4.2  CL 106 107  CO2 24  --   GLUCOSE 223* 225*  BUN 16 21*  CREATININE 1.02 1.00  CALCIUM 9.5  --     Liver Function Tests:  Recent Labs Lab 04/23/16 1108  AST 19  ALT 16*  ALKPHOS 46  BILITOT 0.5  PROT 6.8  ALBUMIN 3.7   No results for input(s): LIPASE, AMYLASE in the last 168 hours. No results for input(s): AMMONIA in the last 168 hours.  CBC:  Recent Labs Lab 04/23/16 1108 04/23/16 1158  WBC 8.1  --   NEUTROABS 5.3  --   HGB 12.8* 13.3  HCT 39.6 39.0  MCV 87.0  --   PLT 309  --     Cardiac Enzymes: No results for input(s): CKTOTAL, CKMB, CKMBINDEX, TROPONINI in the last 168 hours.  Lipid Panel: No results for input(s): CHOL, TRIG, HDL, CHOLHDL, VLDL, LDLCALC in the last 168 hours.  CBG:  Recent Labs Lab 04/23/16 1117  GLUCAP 220*    Microbiology: Results for orders placed or  performed during the hospital encounter of 09/06/15   MRSA PCR Screening     Status: None   Collection Time: 09/06/15  7:48 PM  Result Value Ref Range Status   MRSA by PCR NEGATIVE NEGATIVE Final    Comment:        The GeneXpert MRSA Assay (FDA approved for NASAL specimens only), is one component of a comprehensive MRSA colonization surveillance program. It is not intended to diagnose MRSA infection nor to guide or monitor treatment for MRSA infections.     Coagulation Studies:  Recent Labs  04/23/16 1108  LABPROT 14.3  INR 1.11    Imaging: Ct Head Wo Contrast  Result Date: 04/23/2016 CLINICAL DATA:  Pt fell forward today, pt is having general weakness, pt did not hit his head, no LOC, slurred speech, pt has had prior strokes in march and June this year EXAM: CT HEAD WITHOUT CONTRAST TECHNIQUE: Contiguous axial images were obtained from the base of the skull through the vertex without intravenous contrast. COMPARISON:  01/29/2016 FINDINGS: Brain: No evidence of acute infarction, hemorrhage, hydrocephalus, extra-axial collection or mass lesion/mass effect. Small lacune infarct in the deep white matter of the posterior left frontal lobe, with another along the right caudate nucleus head, both stable from the prior study. Vascular: Vascular stent has been placed in the basilar artery. No hyperdense vessels. Skull: Normal. Negative for fracture or focal lesion. Sinuses/Orbits: Minor mucosal thickening along the floor of the right maxillary sinus. Sinuses otherwise clear. Globes and orbits are unremarkable. Other: Clear mastoid air cells. IMPRESSION: 1. No acute intracranial abnormalities. 2. Vascular stent noted in the basilar artery new since the prior CT. No other change. Electronically Signed   By: Lajean Manes M.D.   On: 04/23/2016 11:33       Assessment and plan discussed with with attending physician and they are in agreement.    Etta Quill PA-C [facility] [phone]  04/23/2016, 1:40 PM   Assessment: 60  y.o. male presenting with transient symptoms of bilateral lower extremity weakness left arm weakness and worsening dysarthria. At time of physical exam patient's symptoms had improved. Patient does have a baseline mild dysarthria. Currently patient is on Eliquis and Plavix. Patient recently had a full stroke workup back in July. Cannot rule out possible TIA.  Stroke Risk Factors - diabetes mellitus, hyperlipidemia and hypertension   Recommend: -MRI of brain. If this is negative no further workup necessary. If positive would recommend further evaluation of intracranial vessels with CTA of head and neck. Echocardiogram, fasting lipid panel, HbA1c, physical therapy and stroke and will follow in the morning.

## 2016-04-23 NOTE — ED Notes (Signed)
Pt ambulated to restroom without distress.  

## 2016-04-23 NOTE — ED Notes (Signed)
Patient transported to CT 

## 2016-04-27 ENCOUNTER — Ambulatory Visit: Payer: BLUE CROSS/BLUE SHIELD | Admitting: Occupational Therapy

## 2016-04-27 ENCOUNTER — Ambulatory Visit: Payer: BLUE CROSS/BLUE SHIELD

## 2016-04-29 ENCOUNTER — Telehealth: Payer: Self-pay

## 2016-04-29 ENCOUNTER — Ambulatory Visit: Payer: BLUE CROSS/BLUE SHIELD

## 2016-04-29 VITALS — BP 138/80

## 2016-04-29 DIAGNOSIS — M6281 Muscle weakness (generalized): Secondary | ICD-10-CM

## 2016-04-29 DIAGNOSIS — R2689 Other abnormalities of gait and mobility: Secondary | ICD-10-CM

## 2016-04-29 DIAGNOSIS — R2681 Unsteadiness on feet: Secondary | ICD-10-CM

## 2016-04-29 NOTE — Telephone Encounter (Signed)
Dr. Leonie Man reviewed the transcranial doppler study. Per Dr.Sethi the results are mild hardening of arteries no worrisome findings. Rn will notify pt of results.

## 2016-04-29 NOTE — Therapy (Addendum)
[facility] [address]  [place], [place], [address] Phone: [phone]   Fax:  [phone]  Physical Therapy Treatment  Patient Details  Name: [name] MRN: [id number] Date of Birth: 07-Dec-1955 Referring Provider: Dr. Leonie Man  Encounter Date: 04/29/2016      PT End of Session - 04/29/16 0812    Visit Number 5   Number of Visits 17   Date for PT Re-Evaluation 06/12/16   Authorization Type [employer] but pt in the process of applying for disability   PT Start Time 0802   PT Stop Time 0845   PT Time Calculation (min) 43 min   Activity Tolerance Patient tolerated treatment well   Behavior During Therapy [facility] for tasks assessed/performed      Past Medical History:  Diagnosis Date  . Arthritis    "hx; cleaned it out of both shoulders"  . CAD (coronary artery disease)    OV, Dr Harlow Asa, MYOVIEW 5/12 on chart  EKG 10/12 EPIC,  chest x ray 01/07/11 EPIC  . Carpal tunnel syndrome    peripheral neuropathy  . Chronic shoulder pain    "both"  . DVT (deep venous thrombosis) (HCC)    hx LLE  . History of kidney stones   . Hyperlipidemia   . Hypertension   . Myocardial infarction 02/2001  . Neuropathy, peripheral (HCC)    both feet  . Peripheral vascular disease, unspecified 03/2015   PCI to the right popliteal  . Skin cancer    "have had them cut or burned off my face" (03/28/2015)  . Stroke ([place]) 10/10/2015  . Type II diabetes mellitus ([place])     Past Surgical History:  Procedure Laterality Date  . APPENDECTOMY  1977  . CARDIAC CATHETERIZATION  2002      . CARPAL TUNNEL RELEASE Right 2000's  . CARPAL TUNNEL RELEASE  12/18/2011   Procedure: CARPAL TUNNEL RELEASE;  Surgeon: Mcarthur Rossetti, MD;  Location: [facility];  Service: Orthopedics;  Laterality: Left;  Left Open Carpal Tunnel Release  . CORONARY ANGIOPLASTY    . CORONARY ARTERY BYPASS GRAFT  2002   CABG X 4  . CYSTOSCOPY  several done in past  .  FEMORAL-TIBIAL BYPASS GRAFT Left 01/07/11   fem-posterior tibial BPG using reversed left GSV               12/15/11 OK BY DR Ninfa Linden TO CONTINUE ASA AND PLAVIX  . FEMORAL-TIBIAL BYPASS GRAFT Left 09/06/2015   Procedure: LEFT FEMORAL-POSTERIOR TIBIAL ARTERY BYPASS GRAFT WITH COMPOSITE PTFE AND RIGHT ARM VEIN;  Surgeon: Elam Dutch, MD;  Location: [facility];  Service: Vascular;  Laterality: Left;  . FEMOROPOPLITEAL THROMBECTOMY / EMBOLECTOMY  ~ 2010  . FRACTURE SURGERY    . IR GENERIC HISTORICAL  02/18/2016   IR RADIOLOGIST EVAL & MGMT 02/18/2016 [facility] RAD  . KNEE ARTHROSCOPY Left X 2  . LAPAROSCOPIC CHOLECYSTECTOMY  1990's  . LITHOTRIPSY  several done in past  . ORIF RADIUS & ULNA FRACTURES Left   . PERIPHERAL VASCULAR CATHETERIZATION N/A 03/28/2015   Procedure: Lower Extremity Angiography;  Surgeon: Lorretta Harp, MD;  Location: [facility];  Service: Cardiovascular;  Laterality: N/A;  . PERIPHERAL VASCULAR CATHETERIZATION Right 03/28/2015   Procedure: Peripheral Vascular Atherectomy;  Surgeon: Lorretta Harp, MD;  Location: [facility];  Service: Cardiovascular;  Laterality: Right;  popliteal;   . PERIPHERAL VASCULAR CATHETERIZATION N/A 08/23/2015   Procedure: Abdominal  Aortogram;  Surgeon: Elam Dutch, MD;  Location: [facility];  Service: Cardiovascular;  Laterality: N/A;  . POPLITEAL ARTERY STENT Left 2010-2012 X 4  . RADIOLOGY WITH ANESTHESIA N/A 02/04/2016   Procedure: Basilar artery angioplasty with stenting;  Surgeon: Luanne Bras, MD;  Location: [facility];  Service: Radiology;  Laterality: N/A;  . SHOULDER ARTHROSCOPY Left   . SHOULDER ARTHROSCOPY Right 12/18/2011  . SHOULDER ARTHROSCOPY  07/08/2012   Procedure: ARTHROSCOPY SHOULDER;  Surgeon: Mcarthur Rossetti, MD;  [facility];  Service: Orthopedics;  Laterality: Left;  Left Shoulder Arthroscopy with Manipulation and Extensive Debridement  . SHOULDER ARTHROSCOPY WITH ROTATOR CUFF REPAIR Left  07/14/2013   Procedure: LEFT SHOULDER ARTHROSCOPY WITH EXTENSIVE DEBRIDEMENT, DISTAL CLAVICLE REPAIR;  Surgeon: Mcarthur Rossetti, MD;  [facility];  Service: Orthopedics;  Laterality: Left;  . SKIN CANCER EXCISION     "left side of my forehead"  . VEIN HARVEST Right 09/06/2015   Procedure: RIGHT ARM VEIN HARVEST;  Surgeon: Elam Dutch, MD;  Location: [facility];  Service: Vascular;  Laterality: Right;    Vitals:   04/29/16 0804  BP: 138/80        Subjective Assessment - 04/29/16 0804    Subjective  Pt. reporting he woke up in the morning last Thursday and had unusually weak legs.  Pt. walked to the bathroom and fell on the hard bathroom floor landing on his R elbow and R side of head.  Pt. reports he did not lose consciousness however was taken by daughter to ED at which point ED performed MRI and various testing determining no acute stroke.  Pt. reporting he has felt weak since this time.  Pt. reporting neuropathic symptoms in B feet have been worse over last few days.   Patient Stated Goals Improve endurance, get legs strong enough to hold up my motorcycle, and improve balance.    Currently in Pain? No/denies   Pain Score 0-No pain   Multiple Pain Sites No        Today's treatment:  Therex: * pre-warm up BP:  138/80 mmHg NuStep: lvl 3, 8 min  * post-warmup: 150/88 mmHg  Gait training:  Pt. ambulated with SPC x 2 laps ~ 180 ft around gym track with supervision from therapist; pt. only requiring verbal cues x 2 for proper sequencing; VC's provided to increased step length  Pt. ambulating with SPC and close supervision from therapist 250 ft x 4; pt. demonstrating good sequencing; however requiring sitting rest break following each ambulation trial due to shortness of breath    Therex:  Hooklying bridge x 15 reps; cues to prevent pt. holding breath  Standing alternating toe-touch to 6" step x 10 reps; close CGA from therapist  * Post therex BP: 136/84 mmHg         PT Short Term Goals - 04/20/16 4166      PT SHORT TERM GOAL #1   Title Pt will verbalize understanding of risk factors and s/s of CVA, to reduce risk of another CVA. TARGET DATE FOR ALL STGS: 05/11/16   Time 4   Period Weeks   Status On-going     PT SHORT TERM GOAL #2   Title Pt will improve FGA score to >/=24/30 to reduce falls risk.   Time 4   Period Weeks   Status On-going     PT SHORT TERM GOAL #3   Title Pt will amb. 500' over even terrain with LRAD at MOD  I level to improve functional mobility.    Time 4   Period Weeks   Status On-going     PT SHORT TERM GOAL #4   Title Pt will improve gait speed to >/=2.69f/sec. with LRAD to safely amb. in the community.    Time 4   Period Weeks   Status On-going           PT Long Term Goals - 04/20/16 0939      PT LONG TERM GOAL #1   Title Pt will amb. 700' over even/uneven terrain with LRAD at MOD I level in order to travel with family. TARGET DATE FOR ALL LTGS: 06/08/16   Time 8   Period Weeks   Status On-going     PT LONG TERM GOAL #2   Title Pt will improve FGA score to >/=28/30 to reduce falls risk.    Time 8   Period Weeks   Status On-going     PT LONG TERM GOAL #3   Title Perform 6MWT test and write goal as indicated.    Time 8   Period Weeks   Status On-going               Plan - 04/29/16 0813    Clinical Impression Statement Pt. reporting episode of feeling weakened legs and falling on Thursday at which point he landed on his R elbow and R side of head.  Pt. reporting he did not lose consciousness however was transported to ED by daughter where they ruled out acute stroke.  Pt. reports having weakened legs and increased neuropathic symptoms in B feet since Thursday however no more falls.  Pt. seen initially today with bruised R elbow.  Pt. BP remained therapeutic throughout treatment today however multiple reports of, "weak feeling" in B LE.  Pt. reports he intends to purchase a SPC to begin walking  with thus [facility].  Frequent sitting rest breaks taken throughout therex today and fatigue avoided.   PT Treatment/Interventions ADLs/Self Care Home Management;Patient/family education;Neuromuscular re-education;Balance training;Therapeutic exercise;Therapeutic activities;Functional mobility training;Stair training;Gait training;Orthotic Fit/Training;Vestibular;Manual techniques;Moist Heat;Electrical Stimulation;Cryotherapy;Biofeedback;DME Instruction   PT Next Visit Plan Begin checking STG's; Monitor walking program tolerance (see pt. education section), continue balance and strengthening      Patient will benefit from skilled therapeutic intervention in order to improve the following deficits and impairments:  Abnormal gait, Decreased strength, Postural dysfunction, Difficulty walking, Decreased mobility, Decreased balance, Decreased coordination, Impaired flexibility, Pain, Decreased endurance, Decreased knowledge of use of DME, Increased edema, Impaired sensation  Visit Diagnosis: Other abnormalities of gait and mobility  Muscle weakness (generalized)  Unsteadiness on feet     Problem List Patient Active Problem List   Diagnosis Date Noted  . Basilar artery stenosis, infarct within 8 weeks 02/04/2016  . Paroxysmal atrial fibrillation ([place] 01/30/2016  . Left pontine CVA ([facility]   . Left pontine stroke ([facility] 01/29/2016  . Stroke (cerebrum) ([place] 01/29/2016  . Acute CVA (cerebrovascular accident) ([place] 01/29/2016  . Occlusion and stenosis of basilar artery   . Hypertriglyceridemia 10/12/2015  . Uncontrolled diabetes mellitus with hyperglycemia, with long-term current use of insulin ([facility] 10/12/2015  . Acute ischemic stroke ([facility] 10/10/2015  . Diabetes type 2, controlled ([place] 10/10/2015  . CVA (cerebral infarction) 10/10/2015  . Claudication ([place] 03/28/2015  . Dyspnea on exertion 02/22/2015  . Pain in joint, lower leg 12/13/2014   . Cramp of both lower extremities 12/13/2014  . Essential hypertension 08/01/2013  .  Hyperlipidemia 08/01/2013  . Coronary artery disease, history of CABG 08/01/2013  . Incomplete rotator cuff tear left shoulder 07/14/2013  . S/P arthroscopy of shoulder 07/14/2013  . Aftercare following surgery of the circulatory system, [place] 11/10/2012  . PVD (peripheral vascular disease) ([place]) 04/26/2012  . 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 Carpal tunnel syndrome of left wrist 12/18/2011  . Peripheral vascular disease, unspecified 07/16/2011  . PAD (peripheral artery disease) ([place]) 04/23/2011    Bess Harvest, PTA 04/29/2016, 12:09 PM  [facility] [address]  [place], [place], [address] Phone: [phone]   Fax:  [phone]  Name: [name] MRN: [id number] Date of Birth: April 01, 1956         PHYSICAL THERAPY DISCHARGE SUMMARY  Visits from Start of Care: 5  Current functional level related to goals / functional outcomes: See above    Remaining deficits: Patient called to cancel remaining appointments as patient continues to report symptoms related to frequent TIAs. At this time additional medical workup has been initiated.    Education / Equipment: HEP  Plan: Patient agrees to discharge.  Patient goals were not met. Patient is being discharged due to a change in medical status.  ?????  Please reorder PT intervention when appropriate.   Laureen Abrahams, PT, DPT 05/11/16 2:14 PM  [facility] [place], [place] [address]  [phone] (office) [phone] (fax)

## 2016-05-04 ENCOUNTER — Encounter: Payer: BLUE CROSS/BLUE SHIELD | Admitting: Occupational Therapy

## 2016-05-04 ENCOUNTER — Telehealth: Payer: Self-pay | Admitting: Neurology

## 2016-05-04 ENCOUNTER — Other Ambulatory Visit: Payer: Self-pay | Admitting: Cardiovascular Disease

## 2016-05-04 NOTE — Telephone Encounter (Signed)
Message sent to Dr. Erlinda Hong the work in am for [facility] for review.

## 2016-05-04 NOTE — Telephone Encounter (Signed)
Dr. Erlinda Hong when you call patient or daughter can you give the results. Marval Regal, RN at 04/29/2016 4:14 PM   Status: Signed    Dr. Leonie Man reviewed the transcranial doppler study. Per Dr.Sethi the results are mild hardening of arteries no worrisome findings. Rn will notify pt of results.

## 2016-05-04 NOTE — Telephone Encounter (Signed)
Daughter Junie Panning called to advise, Father went to [facility] ER October 5th for stroke, "they released him, there wasn't much they could do", states patient "had another stroke yesterday, lost all feeling in legs, speech went, he wouldn't go to the hospital, he didn't want to go, it's his choice". Patient states the feeling in legs and speech came back after approx. 30 minutes. Junie Panning "asks if he needs to come in for follow up or is this something to be expected, to have these mini strokes?". Junie Panning advised that if he is exhibiting signs of stroke, they should call 911 or proceed to ER, Junie Panning again states "he won't go, he doesn't want to go".

## 2016-05-04 NOTE — Telephone Encounter (Signed)
Called pt at 6:20pm with the number on file. However, pt did not pick up the phone and his VM has not been setup yet. Will need to call again later.   Rosalin Hawking, MD PhD Stroke Neurology 05/04/2016 6:20 PM

## 2016-05-05 ENCOUNTER — Other Ambulatory Visit: Payer: Self-pay | Admitting: Neurology

## 2016-05-05 DIAGNOSIS — G450 Vertebro-basilar artery syndrome: Secondary | ICD-10-CM

## 2016-05-05 NOTE — Telephone Encounter (Signed)
Hi, Dr. Leonie Man, could you please give pt daughter a call to answer her questions? I tried twice she was not available. You probably know her and the patient better than me. Thanks.   Jurnie Garritano

## 2016-05-05 NOTE — Telephone Encounter (Signed)
I spoke to daughter Junie Panning and reviewed options. He is clearly having recurrent TIAs despite being on Eliquis and Plavix. It is unclear wether this if from restenosis in his intracranial stent or AFIB. Recommend check CT angio brain and neck and see Dr Estanislado Pandy for any possible endovascular treatment options. Changing Eliquis to Pradaxa , xarelto or Sayvasa not necessarily better treatment option. She voiced understanding

## 2016-05-05 NOTE — Telephone Encounter (Signed)
Called daughter at [phone] at 2:15pm and she was not available. I left VM for her to call back.   Rosalin Hawking, MD PhD Stroke Neurology 05/05/2016 2:15 PM

## 2016-05-05 NOTE — Telephone Encounter (Signed)
Pt's daughter Junie Panning, called back, said she requested to be called at [phone]. Please call her

## 2016-05-06 ENCOUNTER — Ambulatory Visit: Payer: BLUE CROSS/BLUE SHIELD

## 2016-05-06 ENCOUNTER — Ambulatory Visit: Payer: BLUE CROSS/BLUE SHIELD | Admitting: Occupational Therapy

## 2016-05-06 ENCOUNTER — Encounter: Payer: BLUE CROSS/BLUE SHIELD | Admitting: Occupational Therapy

## 2016-05-06 VITALS — BP 156/82 | HR 70

## 2016-05-06 NOTE — Telephone Encounter (Signed)
Dr.Sethi spoke with patients daughter on 05/05/2016 see phone note.

## 2016-05-06 NOTE — Therapy (Signed)
[facility] [address]  [place], [place], [address] Phone: [phone]   Fax:  [phone]  Patient Details  Name: [name] MRN: [id number] Date of Birth: Oct 18, 1955 Referring Provider:  Elam Dutch, MD  Encounter Date: 05/06/2016  Subjective: Pt. seen today initially reporting he didn't feel good enough to do therapy.  Chief complaint was B LE weakness however pain free.  Pt. reporting he has had two, "episodes" over the last 5 days.  When asked for more detail, pt. reports he had episode over weekend with B LE weakness without ability to stand from sitting position.  Pt. reporting he has not informed MD of two most recent, "episodes" of weakness.  Pt. instructed to inform MD of recent episodes and avoid driving as much as possible till seen by MD.  Pt. vitals WNL with BP 156/27mmHG and no report of dizziness.  Therapist walked with pt. to car with no report from pt. of dizziness, LE weakness, or shortness of breath.  Pt. instructed to inform PT of status following MD f/u.    Bess Harvest, PTA 05/06/16 10:47 AM  [facility] [address]  [place], [place], [address] Phone: [phone]   Fax:  [phone]

## 2016-05-06 NOTE — Telephone Encounter (Signed)
ORders put in for CT angio brain and neck, and TCD doppler and sleep referral.

## 2016-05-11 ENCOUNTER — Encounter: Payer: BLUE CROSS/BLUE SHIELD | Admitting: Occupational Therapy

## 2016-05-11 ENCOUNTER — Encounter: Payer: Self-pay | Admitting: Occupational Therapy

## 2016-05-11 NOTE — Therapy (Signed)
[facility] [address]  [place], [place], [address] Phone: [phone]   Fax:  [phone]  Patient Details  Name: [name] MRN: [id number] Date of Birth: 1956/01/19 Referring Provider:  Dr. Antony Contras Encounter Date: 05/11/2016   OCCUPATIONAL THERAPY DISCHARGE SUMMARY  Visits from Start of Care: 2  Current functional level related to goals / functional outcomes:     OT Long Term Goals - 04/22/16 0934      OT LONG TERM GOAL #1   Title Independent with updated coordination and putty HEP (Due 05/21/16)    Time 4   Period Weeks   Status Achieved     OT LONG TERM GOAL #2   Title Improve coordination as evidenced by performing 9 hole peg test in 30 sec or less    Baseline eval = 35.47 sec   Time 4   Period Weeks   Status Not met     OT LONG TERM GOAL #3   Title Improve grip strength Lt hand to 70 lbs or greater to assist with opening containers/jars   Baseline eval = 63 lbs   Time 4   Period Weeks   Status Not met     OT LONG TERM GOAL #4   Title Pt to verbalize understanding with A/E to assist with washing feet and donning socks   Time 4   Period Weeks   Status Achieved        Remaining deficits: Same as initial evaluation   Education / Equipment: HEP for coordination and putty  Plan: Patient agrees to discharge.  Patient goals were partially met. Patient is being discharged due to a change in medical status.  Pt is having multiple TIA's and is not safe to drive to outpatient rehab at this time. Pt will need to have new orders and be medically stable to return to outpatient rehab?????         Carey Bullocks, OTR/L 05/11/2016, 10:11 AM  [facility] [address]  [place], [place], [address] Phone: [phone]   Fax:  [phone]

## 2016-05-13 ENCOUNTER — Encounter: Payer: BLUE CROSS/BLUE SHIELD | Admitting: Occupational Therapy

## 2016-05-15 ENCOUNTER — Ambulatory Visit
Admission: RE | Admit: 2016-05-15 | Discharge: 2016-05-15 | Disposition: A | Discharge status: Home / Self Care | Payer: BLUE CROSS/BLUE SHIELD | Source: Ambulatory Visit | Attending: Neurology | Admitting: Neurology

## 2016-05-15 DIAGNOSIS — G450 Vertebro-basilar artery syndrome: Secondary | ICD-10-CM

## 2016-05-15 MED ORDER — IOPAMIDOL (ISOVUE-370) INJECTION 76%
75.0000 mL | Freq: Once | INTRAVENOUS | Status: AC | PRN
  Administered 2016-05-15: 75 mL via INTRAVENOUS

## 2016-05-18 ENCOUNTER — Encounter: Payer: BLUE CROSS/BLUE SHIELD | Admitting: Occupational Therapy

## 2016-05-21 ENCOUNTER — Telehealth: Payer: Self-pay | Admitting: Neurology

## 2016-05-21 NOTE — Telephone Encounter (Signed)
Pt was returning a call for sleep test. Please call

## 2016-06-10 ENCOUNTER — Encounter: Payer: Self-pay | Admitting: Vascular Surgery

## 2016-06-18 ENCOUNTER — Encounter: Payer: Self-pay | Admitting: Vascular Surgery

## 2016-06-18 ENCOUNTER — Ambulatory Visit (INDEPENDENT_AMBULATORY_CARE_PROVIDER_SITE_OTHER)
Admission: RE | Admit: 2016-06-18 | Discharge: 2016-06-18 | Disposition: A | Discharge status: Home / Self Care | Payer: BLUE CROSS/BLUE SHIELD | Source: Ambulatory Visit | Attending: Vascular Surgery | Admitting: Vascular Surgery

## 2016-06-18 ENCOUNTER — Ambulatory Visit (INDEPENDENT_AMBULATORY_CARE_PROVIDER_SITE_OTHER): Payer: BLUE CROSS/BLUE SHIELD | Admitting: Vascular Surgery

## 2016-06-18 ENCOUNTER — Ambulatory Visit (HOSPITAL_COMMUNITY)
Admission: RE | Admit: 2016-06-18 | Discharge: 2016-06-18 | Disposition: A | Discharge status: Home / Self Care | Payer: BLUE CROSS/BLUE SHIELD | Source: Ambulatory Visit | Attending: Vascular Surgery | Admitting: Vascular Surgery

## 2016-06-18 VITALS — BP 123/82 | HR 83 | Temp 97.9°F | Resp 18 | Ht 71.0 in | Wt 235.0 lb

## 2016-06-18 DIAGNOSIS — I739 Peripheral vascular disease, unspecified: Secondary | ICD-10-CM

## 2016-06-18 DIAGNOSIS — Z95828 Presence of other vascular implants and grafts: Secondary | ICD-10-CM | POA: Insufficient documentation

## 2016-06-18 DIAGNOSIS — I10 Essential (primary) hypertension: Secondary | ICD-10-CM | POA: Diagnosis not present

## 2016-06-18 DIAGNOSIS — E1151 Type 2 diabetes mellitus with diabetic peripheral angiopathy without gangrene: Secondary | ICD-10-CM | POA: Insufficient documentation

## 2016-06-18 DIAGNOSIS — E785 Hyperlipidemia, unspecified: Secondary | ICD-10-CM | POA: Insufficient documentation

## 2016-06-18 NOTE — Progress Notes (Signed)
Patient name: [name]    MRN: [id number]        DOB: January 11, 1956        Sex: male   REASON FOR VISIT: Postoperative follow-up    HPI: [name] is a 60 y.o. male who presents for follow-up status post left superficial femoral to posterior tibial artery bypass using composite non-reversed right arm vein and PTFE on 09/06/2015. This was performed for rest pain. Today, the patient states that he has rest pain has completely resolved. The patient is doing well without any other complaints in his legs. He is not smoking. He has had multiple strokes in the past year. This has left him fairly disabled with lower extremity weakness making his gait very slow and needing support. He also now has some swallowing issues and is unable to drink thin liquids. He also has some speech difficulty. He is on Plavix and Eliquis.  He has continued to have multiple TIAs. He is followed by Dr. Leonie Man from neurology. His most recent CT angiogram of the neck showed a patent vertebral basilar stent that had been placed by Dr. Estanislado Pandy. His carotids had no significant occlusive disease.    Carotid duplex workup was negative.   He continues to walk but is still having some weakness difficulties and some cognitive difficulties post stroke. I reviewed his physical therapy and occupational therapy assessment today.           Current Outpatient Prescriptions on File Prior to Visit  Medication Sig Dispense Refill  . apixaban (ELIQUIS) 5 MG TABS tablet Take 1 tablet (5 mg total) by mouth 2 (two) times daily. 60 tablet 2  . atorvastatin (LIPITOR) 80 MG tablet Take 1 tablet (80 mg total) by mouth daily. 30 tablet 0  . clopidogrel (PLAVIX) 75 MG tablet TAKE 1 TABLET BY MOUTH EVERY DAY 90 tablet 3  . fenofibrate (TRICOR) 145 MG tablet TAKE 1 TABLET (145 MG TOTAL) BY MOUTH DAILY. 90 tablet 3  . gabapentin (NEURONTIN) 300 MG capsule Take 300-600 mg by mouth 2 (two) times daily. Take 600mg  in the morning and 300mg  in the evening.       . insulin aspart (NOVOLOG) 100 UNIT/ML injection Inject 40 Units into the skin 3 (three) times daily before meals. (Patient taking differently: Inject 35 Units into the skin 3 (three) times daily before meals. ) 10 mL 11  . LEVEMIR FLEXTOUCH 100 UNIT/ML Pen Inject 80 Units into the skin 2 (two) times daily. Reported on 08/21/2015 (Patient taking differently: Inject 75 Units into the skin 2 (two) times daily. Reported on 08/21/2015) 15 mL 4  . lisinopril-hydrochlorothiazide (PRINZIDE,ZESTORETIC) 20-12.5 MG per tablet Take 1 tablet by mouth daily with breakfast.       . metFORMIN (GLUCOPHAGE-XR) 500 MG 24 hr tablet Take 1 tablet (500 mg total) by mouth 2 (two) times daily.      . metoprolol succinate (TOPROL-XL) 25 MG 24 hr tablet TAKE 1 TABLET (25 MG TOTAL) BY MOUTH DAILY. 30 tablet 8    No current facility-administered medications on file prior to visit.       REVIEW OF SYSTEMS:     He has shortness of breath with exertion. He denies chest pain.    PHYSICAL EXAM:     Vitals:   06/18/16 1047  BP: 123/82  Pulse: 83  Resp: 18  Temp: 97.9 F (36.6 C)  TempSrc: Oral  SpO2: 98%  Weight: 235 lb (106.6 kg)  Height: 5\' 11"  (  1.803 m)    VASCULAR: Left leg edema improved All incisions healed 2+ posterior tibial pulse left footAbsent pedal pulses right leg Neuro: Symmetric upper extremity and lower extremity motor strength although subtle weakness in the left leg   Data: Duplex ultrasound shows no significant narrowing in the bypass graft there is a caliber size mismatch at the distal anastomosis.This is unchanged from his duplex 3 months ago. ABI was greater than 1 on the left leg 0.75 on the right leg     MEDICAL ISSUES: Patient slowly recovering from his recent stroke. He also still has some weakness in his left leg from his operation. He is unable to work at this point due to multiple disabilities from his strokes. He will follow-up with me in 6 months with a graft duplex at that time  and ABIs.   Ruta Hinds, MD [facility] Office: [phone] Pager: [phone]

## 2016-06-30 ENCOUNTER — Other Ambulatory Visit (HOSPITAL_COMMUNITY): Payer: Self-pay | Admitting: Interventional Radiology

## 2016-06-30 DIAGNOSIS — I771 Stricture of artery: Secondary | ICD-10-CM

## 2016-07-23 ENCOUNTER — Encounter: Payer: Self-pay | Admitting: *Deleted

## 2016-07-28 ENCOUNTER — Other Ambulatory Visit: Payer: Self-pay | Admitting: General Surgery

## 2016-07-28 ENCOUNTER — Other Ambulatory Visit: Payer: Self-pay | Admitting: Radiology

## 2016-07-29 ENCOUNTER — Other Ambulatory Visit: Payer: Self-pay | Admitting: General Surgery

## 2016-07-30 ENCOUNTER — Ambulatory Visit (HOSPITAL_COMMUNITY)
Admission: RE | Admit: 2016-07-30 | Discharge: 2016-07-30 | Disposition: A | Discharge status: Home / Self Care | Payer: BLUE CROSS/BLUE SHIELD | Source: Ambulatory Visit | Attending: Interventional Radiology | Admitting: Interventional Radiology

## 2016-07-30 ENCOUNTER — Other Ambulatory Visit (HOSPITAL_COMMUNITY): Payer: Self-pay | Admitting: Interventional Radiology

## 2016-07-30 ENCOUNTER — Encounter (HOSPITAL_COMMUNITY): Payer: Self-pay

## 2016-07-30 DIAGNOSIS — Z803 Family history of malignant neoplasm of breast: Secondary | ICD-10-CM | POA: Diagnosis not present

## 2016-07-30 DIAGNOSIS — E785 Hyperlipidemia, unspecified: Secondary | ICD-10-CM | POA: Diagnosis not present

## 2016-07-30 DIAGNOSIS — I6523 Occlusion and stenosis of bilateral carotid arteries: Secondary | ICD-10-CM | POA: Diagnosis not present

## 2016-07-30 DIAGNOSIS — I10 Essential (primary) hypertension: Secondary | ICD-10-CM | POA: Insufficient documentation

## 2016-07-30 DIAGNOSIS — Z7902 Long term (current) use of antithrombotics/antiplatelets: Secondary | ICD-10-CM | POA: Insufficient documentation

## 2016-07-30 DIAGNOSIS — I252 Old myocardial infarction: Secondary | ICD-10-CM | POA: Diagnosis not present

## 2016-07-30 DIAGNOSIS — I2510 Atherosclerotic heart disease of native coronary artery without angina pectoris: Secondary | ICD-10-CM | POA: Diagnosis not present

## 2016-07-30 DIAGNOSIS — Z951 Presence of aortocoronary bypass graft: Secondary | ICD-10-CM | POA: Diagnosis not present

## 2016-07-30 DIAGNOSIS — M25511 Pain in right shoulder: Secondary | ICD-10-CM | POA: Diagnosis not present

## 2016-07-30 DIAGNOSIS — G8929 Other chronic pain: Secondary | ICD-10-CM | POA: Insufficient documentation

## 2016-07-30 DIAGNOSIS — Z808 Family history of malignant neoplasm of other organs or systems: Secondary | ICD-10-CM | POA: Insufficient documentation

## 2016-07-30 DIAGNOSIS — I771 Stricture of artery: Secondary | ICD-10-CM

## 2016-07-30 DIAGNOSIS — I651 Occlusion and stenosis of basilar artery: Secondary | ICD-10-CM | POA: Diagnosis not present

## 2016-07-30 DIAGNOSIS — Z87442 Personal history of urinary calculi: Secondary | ICD-10-CM | POA: Insufficient documentation

## 2016-07-30 DIAGNOSIS — E1151 Type 2 diabetes mellitus with diabetic peripheral angiopathy without gangrene: Secondary | ICD-10-CM | POA: Diagnosis not present

## 2016-07-30 DIAGNOSIS — Z8673 Personal history of transient ischemic attack (TIA), and cerebral infarction without residual deficits: Secondary | ICD-10-CM | POA: Insufficient documentation

## 2016-07-30 DIAGNOSIS — Z85828 Personal history of other malignant neoplasm of skin: Secondary | ICD-10-CM | POA: Diagnosis not present

## 2016-07-30 DIAGNOSIS — M25512 Pain in left shoulder: Secondary | ICD-10-CM | POA: Diagnosis not present

## 2016-07-30 HISTORY — PX: IR GENERIC HISTORICAL: IMG1180011

## 2016-07-30 LAB — BASIC METABOLIC PANEL
ANION GAP: 10 (ref 5–15)
BUN: 22 mg/dL — ABNORMAL HIGH (ref 6–20)
CALCIUM: 9.3 mg/dL (ref 8.9–10.3)
CO2: 23 mmol/L (ref 22–32)
CREATININE: 0.98 mg/dL (ref 0.61–1.24)
Chloride: 106 mmol/L (ref 101–111)
GFR calc Af Amer: >60 mL/min (ref >60)
Glucose, Bld: 208 mg/dL — ABNORMAL HIGH (ref 65–99)
Potassium: 4.1 mmol/L (ref 3.5–5.1)
Sodium: 139 mmol/L (ref 135–145)

## 2016-07-30 LAB — PROTIME-INR
INR: 0.92
PROTHROMBIN TIME: 12.4 s (ref 11.4–15.2)

## 2016-07-30 LAB — GLUCOSE, CAPILLARY
Glucose-Capillary: 203 mg/dL — ABNORMAL HIGH (ref 65–99)
Glucose-Capillary: 155 mg/dL — ABNORMAL HIGH (ref 65–99)

## 2016-07-30 LAB — CBC
HCT: 39.2 % (ref 39.0–52.0)
HEMOGLOBIN: 12.9 g/dL — AB (ref 13.0–17.0)
MCH: 28.3 pg (ref 26.0–34.0)
MCHC: 32.9 g/dL (ref 30.0–36.0)
MCV: 86 fL (ref 78.0–100.0)
Platelets: 290 10*3/uL (ref 150–400)
RBC: 4.56 MIL/uL (ref 4.22–5.81)
RDW: 13.8 % (ref 11.5–15.5)
WBC: 8.1 10*3/uL (ref 4.0–10.5)

## 2016-07-30 LAB — APTT: APTT: 29 s (ref 24–36)

## 2016-07-30 MED ORDER — SODIUM CHLORIDE 0.9 % IV SOLN: INTRAVENOUS | Status: AC

## 2016-07-30 MED ORDER — HEPARIN SODIUM (PORCINE) 1000 UNIT/ML IJ SOLN
INTRAMUSCULAR | Status: AC | PRN
  Administered 2016-07-30: 1000 [IU] via INTRAVENOUS

## 2016-07-30 MED ORDER — LIDOCAINE HCL 1 % IJ SOLN
INTRAMUSCULAR | Status: AC | PRN
  Administered 2016-07-30: 10 mL

## 2016-07-30 MED ORDER — FENTANYL CITRATE (PF) 100 MCG/2ML IJ SOLN
INTRAMUSCULAR | Status: AC
  Filled 2016-07-30: qty 2

## 2016-07-30 MED ORDER — HEPARIN SODIUM (PORCINE) 1000 UNIT/ML IJ SOLN
INTRAMUSCULAR | Status: AC
  Filled 2016-07-30: qty 2

## 2016-07-30 MED ORDER — MIDAZOLAM HCL 2 MG/2ML IJ SOLN
INTRAMUSCULAR | Status: AC
  Filled 2016-07-30: qty 2

## 2016-07-30 MED ORDER — MIDAZOLAM HCL 2 MG/2ML IJ SOLN
INTRAMUSCULAR | Status: AC | PRN
  Administered 2016-07-30: 1 mg via INTRAVENOUS

## 2016-07-30 MED ORDER — FENTANYL CITRATE (PF) 100 MCG/2ML IJ SOLN
INTRAMUSCULAR | Status: AC | PRN
  Administered 2016-07-30: 25 ug via INTRAVENOUS

## 2016-07-30 MED ORDER — SODIUM CHLORIDE 0.9 % IV SOLN: INTRAVENOUS | Status: DC

## 2016-07-30 MED ORDER — LIDOCAINE HCL 1 % IJ SOLN
INTRAMUSCULAR | Status: AC
  Filled 2016-07-30: qty 20

## 2016-07-30 MED ORDER — SODIUM CHLORIDE 0.9 % IV SOLN
INTRAVENOUS | Status: AC | PRN
  Administered 2016-07-30: 10 mL/h via INTRAVENOUS

## 2016-07-30 MED ORDER — IOPAMIDOL (ISOVUE-300) INJECTION 61%
INTRAVENOUS | Status: AC
  Filled 2016-07-30: qty 150

## 2016-07-30 NOTE — Procedures (Signed)
S/P bilateral common carotid arteriogram and RT Vert arteriogram. RT CFa approach. Findings. 1.Approx 50 % stenosis of basilar artery at prox stent struts and LT VBJ with a 50 %stenosis prox to PICA. 2.Approx 50 % stenosis of both ICAs in the supraclinoid region and of RT MCA mid M1 seg of 50 %

## 2016-07-30 NOTE — Sedation Documentation (Signed)
5 Fr. Exoseal to right groin 

## 2016-07-30 NOTE — Discharge Instructions (Signed)
Restart eliquis and plavix tonight  Angiogram, Care After Refer to this sheet in the next few weeks. These instructions provide you with information about caring for yourself after your procedure. Your health care provider may also give you more specific instructions. Your treatment has been planned according to current medical practices, but problems sometimes occur. Call your health care provider if you have any problems or questions after your procedure. What can I expect after the procedure? After your procedure, it is typical to have the following:  Bruising at the catheter insertion site that usually fades within 1-2 weeks.  Blood collecting in the tissue (hematoma) that may be painful to the touch. It should usually decrease in size and tenderness within 1-2 weeks. Follow these instructions at home:  Take medicines only as directed by your health care provider.  You may shower 24-48 hours after the procedure or as directed by your health care provider. Remove the bandage (dressing) and gently wash the site with plain soap and water. Pat the area dry with a clean towel. Do not rub the site, because this may cause bleeding.  Do not take baths, swim, or use a hot tub until your health care provider approves.  Check your insertion site every day for redness, swelling, or drainage.  Do not apply powder or lotion to the site.  Do not lift over 10 lb (4.5 kg) for 5 days after your procedure or as directed by your health care provider.  Ask your health care provider when it is okay to:  Return to work or school.  Resume usual physical activities or sports.  Resume sexual activity.  Do not drive home if you are discharged the same day as the procedure. Have someone else drive you.  You may drive 24 hours after the procedure unless otherwise instructed by your health care provider.  Do not operate machinery or power tools for 24 hours after the procedure or as directed by your health  care provider.  If your procedure was done as an outpatient procedure, which means that you went home the same day as your procedure, a responsible adult should be with you for the first 24 hours after you arrive home.  Keep all follow-up visits as directed by your health care provider. This is important. Contact a health care provider if:  You have a fever.  You have chills.  You have increased bleeding from the catheter insertion site. Hold pressure on the site. CALL 911 Get help right away if:  You have unusual pain at the catheter insertion site.  You have redness, warmth, or swelling at the catheter insertion site.  You have drainage (other than a small amount of blood on the dressing) from the catheter insertion site.  The catheter insertion site is bleeding, and the bleeding does not stop after 30 minutes of holding steady pressure on the site.  The area near or just beyond the catheter insertion site becomes pale, cool, tingly, or numb. This information is not intended to replace advice given to you by your health care provider. Make sure you discuss any questions you have with your health care provider. Document Released: 01/22/2005 Document Revised: 12/12/2015 Document Reviewed: 12/07/2012 Elsevier Interactive Patient Education  2017 [employer].

## 2016-07-30 NOTE — Sedation Documentation (Signed)
ETCO2 removed per Dr. Deveshwar 

## 2016-07-30 NOTE — H&P (Signed)
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 Antony Contras  Supervising Physician: Luanne Bras  Patient Status: [facility] - Out-pt  HPI: [name] is an 61 y.o. 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  He underwent revascularization of both these areas with stenting.  He has actually had at least one TIA since this procedure was done, but no further ones since October.  He admits to leg weakness from his prior strokes, but does not use any assistive devices to ambulate.  He presents today for a follow up angiogram.  He is still taking his plavix and last took it yesterday.   Past Medical History:  Past Medical History:  Diagnosis Date  . Arthritis    "hx; cleaned it out of both shoulders"  . CAD (coronary artery disease)    OV, Dr Harlow Asa, [facility] 5/12 on chart  EKG 10/12 EPIC,  chest x ray 01/07/11 EPIC  . Carpal tunnel syndrome    peripheral neuropathy  . Chronic shoulder pain    "both"  . DVT (deep venous thrombosis) (HCC)    hx LLE  . History of kidney stones   . Hyperlipidemia   . Hypertension   . Myocardial infarction 02/2001  . Neuropathy, peripheral (HCC)    both feet  . Peripheral vascular disease, unspecified 03/2015   PCI to the right popliteal  . Pseudobulbar affect   . Skin cancer    "have had them cut or burned off my face" (03/28/2015)  . Stroke ([place]) 10/10/2015  . Type II diabetes mellitus ([place])     Past Surgical History:  Past Surgical History:  Procedure Laterality Date  . APPENDECTOMY  1977  . CARDIAC CATHETERIZATION  2002      . CARPAL TUNNEL RELEASE Right 2000's  . CARPAL TUNNEL RELEASE  12/18/2011   Procedure: CARPAL TUNNEL RELEASE;  Surgeon: Mcarthur Rossetti, MD;  Location: [facility];  Service: Orthopedics;  Laterality: Left;  Left Open Carpal Tunnel  Release  . CORONARY ANGIOPLASTY    . CORONARY ARTERY BYPASS GRAFT  2002   CABG X 4  . CYSTOSCOPY  several done in past  . FEMORAL-TIBIAL BYPASS GRAFT Left 01/07/11   fem-posterior tibial BPG using reversed left GSV               12/15/11 OK BY DR Ninfa Linden TO CONTINUE ASA AND PLAVIX  . FEMORAL-TIBIAL BYPASS GRAFT Left 09/06/2015   Procedure: LEFT FEMORAL-POSTERIOR TIBIAL ARTERY BYPASS GRAFT WITH COMPOSITE PTFE AND RIGHT ARM VEIN;  Surgeon: Elam Dutch, MD;  Location: [place];  Service: Vascular;  Laterality: Left;  . FEMOROPOPLITEAL THROMBECTOMY / EMBOLECTOMY  ~ 2010  . FRACTURE SURGERY    . IR GENERIC HISTORICAL  02/18/2016   IR RADIOLOGIST EVAL & MGMT 02/18/2016 [facility]INTER[facility] RAD  . KNEE ARTHROSCOPY Left X 2  . LAPAROSCOPIC CHOLECYSTECTOMY  1990's  . LITHOTRIPSY  several done in past  . ORIF RADIUS & ULNA FRACTURES Left   . PERIPHERAL VASCULAR CATHETERIZATION N/A 03/28/2015   Procedure: Lower Extremity Angiography;  Surgeon: Lorretta Harp, MD;  Location: [place] [facility];  Service: Cardiovascular;  Laterality: N/A;  . PERIPHERAL VASCULAR CATHETERIZATION Right 03/28/2015   Procedure: Peripheral Vascular Atherectomy;  Surgeon: Lorretta Harp, MD;  Location: [place] [facility];  Service: Cardiovascular;  Laterality: Right;  popliteal;   . PERIPHERAL VASCULAR CATHETERIZATION N/A 08/23/2015   Procedure: Abdominal Aortogram;  Surgeon: Elam Dutch, MD;  Location: [place] [facility];  Service: Cardiovascular;  Laterality: N/A;  . POPLITEAL ARTERY STENT Left 2010-2012 X 4  . RADIOLOGY WITH ANESTHESIA N/A 02/04/2016   Procedure: Basilar artery angioplasty with stenting;  Surgeon: Luanne Bras, MD;  Location: [place];  Service: Radiology;  Laterality: N/A;  . SHOULDER ARTHROSCOPY Left   . SHOULDER ARTHROSCOPY Right 12/18/2011  . SHOULDER ARTHROSCOPY  07/08/2012   Procedure: ARTHROSCOPY SHOULDER;  Surgeon: Mcarthur Rossetti, MD;  Location: [facility];  Service: Orthopedics;  Laterality: Left;   Left Shoulder Arthroscopy with Manipulation and Extensive Debridement  . SHOULDER ARTHROSCOPY WITH ROTATOR CUFF REPAIR Left 07/14/2013   Procedure: LEFT SHOULDER ARTHROSCOPY WITH EXTENSIVE DEBRIDEMENT, DISTAL CLAVICLE REPAIR;  Surgeon: Mcarthur Rossetti, MD;  Location: [facility];  Service: Orthopedics;  Laterality: Left;  . SKIN CANCER EXCISION     "left side of my forehead"  . VEIN HARVEST Right 09/06/2015   Procedure: RIGHT ARM VEIN HARVEST;  Surgeon: Elam Dutch, MD;  Location: [facility];  Service: Vascular;  Laterality: Right;    Family History:  Family History  Problem Relation Age of Onset  . Cancer Mother     Breast and Brain tumor  . Cancer Father     Blood vessel tumor    Social History:  reports that he has never smoked. He has never used smokeless tobacco. He reports that he does not drink alcohol or use drugs.  Allergies: No Known Allergies  Medications: Medications reviewed in epic  Please HPI for pertinent positives, otherwise complete 10 system ROS negative, +slurring of speech.  Mallampati Score: MD Evaluation Airway: WNL Heart: WNL Abdomen: WNL Chest/ Lungs: WNL ASA  Classification: 3 Mallampati/Airway Score: Two  Physical Exam: BP (!) 155/78   Pulse 78   Temp 97.9 F (36.6 C) (Oral)   Resp 20   Ht 5' 10"  (1.778 m)   Wt 240 lb (108.9 kg)   SpO2 98%   BMI 34.44 kg/m  Body mass index is 34.44 kg/m. General: pleasant, WD, WN white male who is laying in bed in NAD HEENT: head is normocephalic, atraumatic.  Sclera are noninjected.  PERRL.  Ears and nose without any masses or lesions.  Mouth is pink and moist.  Slurring of his words at times, which is chronic Heart: regular, rate, and rhythm.  Normal s1,s2. No obvious murmurs, gallops, or rubs noted.  Palpable radial and pedal pulses bilaterally Lungs: CTAB, no wheezes, rhonchi, or rales noted.  Respiratory effort nonlabored Abd: soft, NT, ND, +BS, no masses, hernias, or organomegaly Psych: A&Ox3 with  an appropriate affect.   Labs: Results for orders placed or performed during the hospital encounter of 07/30/16 (from the past 48 hour(s))  APTT     Status: None   Collection Time: 07/30/16  7:00 AM  Result Value Ref Range   aPTT 29 24 - 36 seconds  Basic metabolic panel     Status: Abnormal   Collection Time: 07/30/16  7:00 AM  Result Value Ref Range   Sodium 139 135 - 145 mmol/L   Potassium 4.1 3.5 - 5.1 mmol/L   Chloride 106 101 - 111 mmol/L   CO2 23 22 - 32 mmol/L   Glucose, Bld 208 (H) 65 - 99 mg/dL   BUN 22 (H) 6 - 20 mg/dL   Creatinine, Ser 0.98 0.61 - 1.24 mg/dL  Calcium 9.3 8.9 - 10.3 mg/dL   GFR calc non Af Amer >60 >60 mL/min   GFR calc Af Amer >60 >60 mL/min    Comment: (NOTE) The eGFR has been calculated using the CKD EPI equation. This calculation has not been validated in all clinical situations. eGFR's persistently <60 mL/min signify possible Chronic Kidney Disease.    Anion gap 10 5 - 15  CBC     Status: Abnormal   Collection Time: 07/30/16  7:00 AM  Result Value Ref Range   WBC 8.1 4.0 - 10.5 K/uL   RBC 4.56 4.22 - 5.81 MIL/uL   Hemoglobin 12.9 (L) 13.0 - 17.0 g/dL   HCT 39.2 39.0 - 52.0 %   MCV 86.0 78.0 - 100.0 fL   MCH 28.3 26.0 - 34.0 pg   MCHC 32.9 30.0 - 36.0 g/dL   RDW 13.8 11.5 - 15.5 %   Platelets 290 150 - 400 K/uL  Protime-INR     Status: None   Collection Time: 07/30/16  7:00 AM  Result Value Ref Range   Prothrombin Time 12.4 11.4 - 15.2 seconds   INR 0.92   Glucose, capillary     Status: Abnormal   Collection Time: 07/30/16  7:03 AM  Result Value Ref Range   Glucose-Capillary 203 (H) 65 - 99 mg/dL    Imaging: No results found.  Assessment/Plan 1. S/p revascularization of left vertebral and basilar artery stenosis with stenting in July 2017. Recent TIAs -we will plan to proceed with a diagnostic cerebral arteriogram to further assess this revascularized areas as well as other areas to make sure no further treatment is  needed. -labs and vitals reviewed -Risks and Benefits discussed with the patient including, but not limited to bleeding, infection, vascular injury or contrast induced renal failure. All of the patient's questions were answered, patient is agreeable to proceed. Consent signed and in chart.   Thank you for this interesting consult.  I greatly enjoyed meeting REAKWON BARREN and look forward to participating in their care.  A copy of this report was sent to the requesting provider on this date.  Electronically Signed: Henreitta Cea 07/30/2016, 8:21 AM   I spent a total of    25 Minutes in face to face in clinical consultation, greater than 50% of which was counseling/coordinating care for vertebral and basilar artery stenosis

## 2016-07-30 NOTE — Sedation Documentation (Signed)
Pt here for cerebral angiogram. Awake and alert. Speech slightly slurred from previous stroke. MAE well. Oriented X3.

## 2016-07-30 NOTE — Sedation Documentation (Signed)
V-Paad to right groin

## 2016-07-31 ENCOUNTER — Encounter (HOSPITAL_COMMUNITY): Payer: Self-pay | Admitting: Interventional Radiology

## 2016-08-03 ENCOUNTER — Telehealth: Payer: Self-pay | Admitting: Neurology

## 2016-08-03 ENCOUNTER — Telehealth: Payer: Self-pay | Admitting: *Deleted

## 2016-08-03 NOTE — Telephone Encounter (Signed)
Patient left a msg on the refill vm requesting a new eliquis discount card. He stated that the rx will cost over $500 without the card, he has used one in the past but it has expired. Patient can be reached at [phone]. Thanks, MI

## 2016-08-04 ENCOUNTER — Telehealth: Payer: Self-pay | Admitting: Cardiovascular Disease

## 2016-08-04 NOTE — Telephone Encounter (Signed)
New Message      *STAT* If patient is at the pharmacy, call can be transferred to refill team.   1. Which medications need to be refilled? (please list name of each medication and dose if known) Eliquis   2. Which pharmacy/location (including street and city if local pharmacy) is medication to be sent to? [facility] on [address]   3. Do they need a 30 day or 90 day supply? 90   Do you have a discount card you can give him? He can not afford it ?

## 2016-08-07 NOTE — Telephone Encounter (Signed)
Called patient. He needs eliquis discount card. One left at front desk for patient. He states he will pick on Monday Jan 22

## 2016-08-07 NOTE — Telephone Encounter (Signed)
Patient notified eliquis discount card will be left for him to pick up.

## 2016-09-03 DIAGNOSIS — Z0271 Encounter for disability determination: Secondary | ICD-10-CM

## 2016-09-18 ENCOUNTER — Telehealth: Payer: Self-pay

## 2016-09-18 NOTE — Telephone Encounter (Signed)
Form for dental procedure left in Dr. Leonie Man desk for review.

## 2016-09-28 NOTE — Telephone Encounter (Signed)
Form fax to [facility] at [phone]. Form fax and receive twice.

## 2016-09-29 ENCOUNTER — Other Ambulatory Visit: Payer: Self-pay | Admitting: Cardiovascular Disease

## 2016-09-29 NOTE — Telephone Encounter (Signed)
Rx(s) sent to pharmacy electronically.  

## 2016-10-02 ENCOUNTER — Ambulatory Visit: Payer: BLUE CROSS/BLUE SHIELD | Admitting: Nurse Practitioner

## 2016-11-05 ENCOUNTER — Ambulatory Visit (INDEPENDENT_AMBULATORY_CARE_PROVIDER_SITE_OTHER): Payer: BLUE CROSS/BLUE SHIELD | Admitting: Nurse Practitioner

## 2016-11-05 ENCOUNTER — Encounter: Payer: Self-pay | Admitting: Nurse Practitioner

## 2016-11-05 VITALS — BP 127/71 | HR 59 | Wt 248.4 lb

## 2016-11-05 DIAGNOSIS — I639 Cerebral infarction, unspecified: Secondary | ICD-10-CM

## 2016-11-05 DIAGNOSIS — E785 Hyperlipidemia, unspecified: Secondary | ICD-10-CM | POA: Diagnosis not present

## 2016-11-05 DIAGNOSIS — I6350 Cerebral infarction due to unspecified occlusion or stenosis of unspecified cerebral artery: Secondary | ICD-10-CM

## 2016-11-05 DIAGNOSIS — R0683 Snoring: Secondary | ICD-10-CM | POA: Diagnosis not present

## 2016-11-05 DIAGNOSIS — I10 Essential (primary) hypertension: Secondary | ICD-10-CM

## 2016-11-05 NOTE — Patient Instructions (Signed)
Stressed the importance of management of risk factors to prevent further stroke Continue Plavix and Eliquis for secondary stroke prevention and atrial fib Maintain strict control of hypertension with blood pressure goal below 130/90, today's reading127/71 continue antihypertensive medications Control of diabetes with hemoglobin A1c below 6.5 followed by primary care most recent random glucose 155 continue diabetic medications Cholesterol with LDL cholesterol less than 70, followed by cardiology continue  statin drugs Exercise by walking, slowly increase , eat healthy diet with whole grains,  fresh fruits and vegetables Will check on sleep study F/U in 6 months

## 2016-11-05 NOTE — Progress Notes (Addendum)
[facility]  PATIENT: [name] DOB: 09/17/1955   REASON FOR VISIT: Follow-up for brain stem stroke HISTORY FROM: Patient    HISTORY OF PRESENT ILLNESS:UPDATE 04/19/2018CM  [name], 61 year old male returns for follow-up with hospital admission in July 2017 for pontine stroke. Patient has multiple vascular risk factors including peripheral vascular disease severe hyperlipidemia diabetes hypertension and mild obesity.He is currently on Plavix and eliquis for secondary stroke prevention and atrial fibrillation. He denies any further stroke or TIA symptoms, minimal bruising and no bleeding. He remains on Lipitor for hyperlipidemia, denies myalgias. He claims his diabetes is in better control but is still not where it needs to be. He still has not obtained his split night sleep study. He continues to have some slurring of speech particularly when fatigued. He has not exercising at present and  claims he has decreased stamina due to his peripheral vascular disease. He returns for reevaluation    HISTORY: 04/01/16 PSMr Brodhead is a 61 year old Caucasian male seen today for first office follow-up visit following hospital admission for pontine stroke in July 2017. He is accompanied today by his wife.[name] is a 29 y.o. malewith history of stroke in March 2017, CAD status post CABG, diabetes mellitus type 2, hypertriglyceridemia, peripheral vascular disease status post femoro/tibial bypass in February 2017 presents to the ER because of persistent difficulty with expressive aphasia and dizziness. Patient's symptoms started on 01/27/2016 when he woke up in the morning. CT of the head did not show anything acute. MRI of the brain shows left pontine infarct.  . Patient on exam is able to move all extremities without difficulty but still has expressive aphasia. No obvious facial asymmetry. Patient is beyond the window period for TPA. [name] admitted for further evaluation and treatment. On  the night after admission he developed worsening neurological status with dysarthria and dysphagia and right facial droop as well as right-sided weakness. Repeat CT scan showed no acute abnormality. 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 Patient had known evidence of posterior circulation disease from previous admission in March 2017. 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 He was seen by Dr. Estanislado Pandy and plan was to do elective basilar artery stenting. He returned a week later and underwent stenting by Dr. Estanislado Pandy on 02/04/16 which was uneventful. Patient postprocedure developed new-onset atrial fibrillation and was started on eliquis and Plavix was continued for the stent. Patient has multiple vascular risk factors including peripheral vascular disease severe hyperlipidemia diabetes hypertension and mild obesity. He states his done well since discharge. He still has some slurred speech. His finish home physical occupational therapy but is starting outpatient therapy soon. Still has some occasional slurred speech and is tired. He can walk independently. His stamina and walking is limited mainly from his peripheral vascular disease. Patient is having only minor bruising but no bleeding. Patient does admit to snoring as well as wife has noticed witnessed apneas. He is not been evaluated for sleep apnea with a polysomnogram yet but is willing to get it done. He started seeing a physician and to help manage his triglycerides and lipids. He has an upcoming appointment to see Dr. Estanislado Pandy to follow this basilar stent.    REVIEW OF SYSTEMS: Full 14 system review of systems performed and notable only for those listed, all others are neg:  Constitutional:  Fatigue Cardiovascular: neg Ear/Nose/Throat: neg  Skin: neg Eyes: neg  Respiratory:  Shortness of breath Gastroitestinal: neg  Hematology/Lymphatic:  neg  Endocrine: neg Musculoskeletal:neg Allergy/Immunology: neg Neurological: neg Psychiatric: neg Sleep :  Snoring  ALLERGIES: No Known Allergies  HOME MEDICATIONS: Outpatient Medications Prior to Visit  Medication Sig Dispense Refill  . atorvastatin (LIPITOR) 40 MG tablet Take 40 mg by mouth daily.    . clopidogrel (PLAVIX) 75 MG tablet Take 1 tablet (75 mg total) by mouth daily. PLEASE CONTACT OFFICE FOR ADDITIONAL REFILLS 30 tablet 0  . ELIQUIS 5 MG TABS tablet TAKE 1 TABLET BY MOUTH TWICE A DAY 60 tablet 6  . fenofibrate (TRICOR) 145 MG tablet TAKE 1 TABLET (145 MG TOTAL) BY MOUTH DAILY. 90 tablet 3  . gabapentin (NEURONTIN) 300 MG capsule Take 300-600 mg by mouth 2 (two) times daily. Take 600mg  in the morning and 300mg  in the evening.    [name] ibuprofen (ADVIL,MOTRIN) 200 MG tablet Take 200 mg by mouth every evening. Takes before taking Niacin to keep from getting the flushing with med    . insulin aspart (NOVOLOG) 100 UNIT/ML injection Inject 40 Units into the skin 3 (three) times daily before meals. 10 mL 11  . LEVEMIR FLEXTOUCH 100 UNIT/ML Pen Inject 80 Units into the skin 2 (two) times daily. Reported on 08/21/2015 (Patient taking differently: Inject 75 Units into the skin 2 (two) times daily. Reported on 08/21/2015) 15 mL 4  . lisinopril-hydrochlorothiazide (PRINZIDE,ZESTORETIC) 20-12.5 MG per tablet Take 1 tablet by mouth daily with breakfast.     . metFORMIN (GLUCOPHAGE-XR) 500 MG 24 hr tablet Take 1 tablet (500 mg total) by mouth 2 (two) times daily.    . metoprolol succinate (TOPROL-XL) 25 MG 24 hr tablet Take 1 tablet (25 mg total) by mouth daily. 90 tablet 3  . niacin 500 MG tablet Take 500 mg by mouth daily after supper.    [name] JARDIANCE 10 MG TABS tablet Take 10 mg by mouth daily.     No facility-administered medications prior to visit.     PAST MEDICAL HISTORY: Past Medical History:  Diagnosis Date  . Arthritis    "hx; cleaned it out of both shoulders"  . CAD (coronary  artery disease)    OV, Dr Harlow Asa, [facility] 5/12 on chart  EKG 10/12 EPIC,  chest x ray 01/07/11 EPIC  . Carpal tunnel syndrome    peripheral neuropathy  . Chronic shoulder pain    "both"  . DVT (deep venous thrombosis) (HCC)    hx LLE  . History of kidney stones   . Hyperlipidemia   . Hypertension   . Myocardial infarction ([place]) 02/2001  . Neuropathy, peripheral    both feet  . Peripheral vascular disease, unspecified ([place]) 03/2015   PCI to the right popliteal  . Pseudobulbar affect   . Skin cancer    "have had them cut or burned off my face" (03/28/2015)  . Stroke ([place]) 10/10/2015  . Type II diabetes mellitus ([place])     PAST SURGICAL HISTORY: Past Surgical History:  Procedure Laterality Date  . APPENDECTOMY  1977  . CARDIAC CATHETERIZATION  2002      . CARPAL TUNNEL RELEASE Right 2000's  . CARPAL TUNNEL RELEASE  12/18/2011   Procedure: CARPAL TUNNEL RELEASE;  Surgeon: Mcarthur Rossetti, MD;  Location: [facility];  Service: Orthopedics;  Laterality: Left;  Left Open Carpal Tunnel Release  . CORONARY ANGIOPLASTY    . CORONARY ARTERY BYPASS GRAFT  2002   CABG X 4  . CYSTOSCOPY  several done  in past  . FEMORAL-TIBIAL BYPASS GRAFT Left 01/07/11   fem-posterior tibial BPG using reversed left GSV               12/15/11 OK BY DR Ninfa Linden TO CONTINUE ASA AND PLAVIX  . FEMORAL-TIBIAL BYPASS GRAFT Left 09/06/2015   Procedure: LEFT FEMORAL-POSTERIOR TIBIAL ARTERY BYPASS GRAFT WITH COMPOSITE PTFE AND RIGHT ARM VEIN;  Surgeon: Elam Dutch, MD;  Location: [place];  Service: Vascular;  Laterality: Left;  . FEMOROPOPLITEAL THROMBECTOMY / EMBOLECTOMY  ~ 2010  . FRACTURE SURGERY    . IR GENERIC HISTORICAL  02/18/2016   IR RADIOLOGIST EVAL & MGMT 02/18/2016 [facility]  . IR GENERIC HISTORICAL  07/30/2016   IR ANGIO VERTEBRAL SEL VERTEBRAL UNI L MOD SED 07/30/2016 Luanne Bras, MD [facility]  . IR GENERIC HISTORICAL  07/30/2016   IR ANGIO INTRA EXTRACRAN SEL COM CAROTID INNOMINATE BILAT  MOD SED 07/30/2016 Luanne Bras, MD [facility]  . KNEE ARTHROSCOPY Left X 2  . LAPAROSCOPIC CHOLECYSTECTOMY  1990's  . LITHOTRIPSY  several done in past  . ORIF RADIUS & ULNA FRACTURES Left   . PERIPHERAL VASCULAR CATHETERIZATION N/A 03/28/2015   Procedure: Lower Extremity Angiography;  Surgeon: Lorretta Harp, MD;  Location: [place] [facility];  Service: Cardiovascular;  Laterality: N/A;  . PERIPHERAL VASCULAR CATHETERIZATION Right 03/28/2015   Procedure: Peripheral Vascular Atherectomy;  Surgeon: Lorretta Harp, MD;  Location: [place] [facility];  Service: Cardiovascular;  Laterality: Right;  popliteal;   . PERIPHERAL VASCULAR CATHETERIZATION N/A 08/23/2015   Procedure: Abdominal Aortogram;  Surgeon: Elam Dutch, MD;  Location: [place] [facility];  Service: Cardiovascular;  Laterality: N/A;  . POPLITEAL ARTERY STENT Left 2010-2012 X 4  . RADIOLOGY WITH ANESTHESIA N/A 02/04/2016   Procedure: Basilar artery angioplasty with stenting;  Surgeon: Luanne Bras, MD;  Location: [place];  Service: Radiology;  Laterality: N/A;  . SHOULDER ARTHROSCOPY Left   . SHOULDER ARTHROSCOPY Right 12/18/2011  . SHOULDER ARTHROSCOPY  07/08/2012   Procedure: ARTHROSCOPY SHOULDER;  Surgeon: Mcarthur Rossetti, MD;  Location: [facility];  Service: Orthopedics;  Laterality: Left;  Left Shoulder Arthroscopy with Manipulation and Extensive Debridement  . SHOULDER ARTHROSCOPY WITH ROTATOR CUFF REPAIR Left 07/14/2013   Procedure: LEFT SHOULDER ARTHROSCOPY WITH EXTENSIVE DEBRIDEMENT, DISTAL CLAVICLE REPAIR;  Surgeon: Mcarthur Rossetti, MD;  Location[facility];  Service: Orthopedics;  Laterality: Left;  . SKIN CANCER EXCISION     "left side of my forehead"  . VEIN HARVEST Right 09/06/2015   Procedure: RIGHT ARM VEIN HARVEST;  Surgeon: Elam Dutch, MD;  [facility];  Service: Vascular;  Laterality: Right;    FAMILY HISTORY: Family History  Problem Relation Age of Onset  . Cancer Mother     Breast  and Brain tumor  . Cancer Father     Blood vessel tumor    SOCIAL HISTORY: Social History   Social History  . Marital status: Widowed    Spouse name: N/A  . Number of children: N/A  . Years of education: N/A   Occupational History  . Not on file.   Social History Main Topics  . Smoking status: Never Smoker  . Smokeless tobacco: Never Used  . Alcohol use No  . Drug use: No  . Sexual activity: Yes   Other Topics Concern  . Not on file   Social History Narrative  . No narrative on file     PHYSICAL EXAM  Vitals:  11/05/16 0915  BP: 127/71  Pulse: (!) 59  Weight: 248 lb 6.4 oz (112.7 kg)   Body mass index is 35.64 kg/m.  Generalized: Well developed, Obese malein no acute distress  Head: normocephalic and atraumatic,. Oropharynx benign  Neck: Supple, no carotid bruits  Cardiac: Regular rate rhythm, no murmur  Musculoskeletal: No deformity   Neurological examination   Mentation: Alert oriented to time, place, history taking. Attention span and concentration appropriate. Recent and remote memory intact.  Follows all commands speech  with occasional slurred words and language fluent.   Cranial nerve II-XII: Pupils were equal round reactive to light extraocular movements were full, visual field were full on confrontational test. Facial sensation and strength were normal. hearing was intact to finger rubbing bilaterally. Uvula tongue midline. head turning and shoulder shrug were normal and symmetric.Tongue protrusion into cheek strength was normal. Motor: normal bulk and tone, full strength in the BUE, BLE, fine finger movements normal, no pronator drift. No focal weakness Sensory: normal and symmetric to light touch,  on the face arms and legs Coordination: finger-nose-finger, heel-to-shin bilaterally, no dysmetria Reflexes:  1+ upper lower and symmetric , plantar responses were flexor bilaterally. Gait and Station: Rising up from seated position without assistance,   wide based stance,  moderate stride, good arm swing, smooth turning, unable to perform tiptoe, and heel walking or tandem gait. DIAGNOSTIC DATA (LABS, IMAGING, TESTING) - I reviewed patient records, labs, notes, testing and imaging myself where available.  Lab Results  Component Value Date   WBC 8.1 07/30/2016   HGB 12.9 (L) 07/30/2016   HCT 39.2 07/30/2016   MCV 86.0 07/30/2016   PLT 290 07/30/2016      Component Value Date/Time   NA 139 07/30/2016 0700   K 4.1 07/30/2016 0700   CL 106 07/30/2016 0700   CO2 23 07/30/2016 0700   GLUCOSE 208 (H) 07/30/2016 0700   BUN 22 (H) 07/30/2016 0700   CREATININE 0.98 07/30/2016 0700   CREATININE 0.93 03/22/2015 0001   CALCIUM 9.3 07/30/2016 0700   PROT 6.8 04/23/2016 1108   ALBUMIN 3.7 04/23/2016 1108   AST 19 04/23/2016 1108   ALT 16 (L) 04/23/2016 1108   ALKPHOS 46 04/23/2016 1108   BILITOT 0.5 04/23/2016 1108   GFRNONAA >60 07/30/2016 0700   GFRAA >60 07/30/2016 0700   Lab Results  Component Value Date   CHOL 523 (H) 01/29/2016   HDL NOT REPORTED DUE TO HIGH TRIGLYCERIDES 01/29/2016   LDLCALC UNABLE TO CALCULATE IF TRIGLYCERIDE OVER 400 mg/dL 01/29/2016   LDLDIRECT 87 01/16/2015   TRIG 1,925 (H) 01/29/2016   CHOLHDL NOT REPORTED DUE TO HIGH TRIGLYCERIDES 01/29/2016   Lab Results  Component Value Date   HGBA1C 11.5 (H) 01/29/2016   No results found for: VITAMINB12 Lab Results  Component Value Date   TSH 2.497 01/29/2016      ASSESSMENT AND PLAN 44 year Caucasian male with recurrent pontine infarct secondary to symptomatic basilar stenosis in July 2017 status post elective angioplasty stenting. Also new diagnosis of atrial fibrillation. Multiple vascular risk factors of obesity, diabetes, hypertension, hyperlipidemia, peripheral vascular disease and intracranial stenosis. The patient is a current patient of Dr.Sethi   who is out of the office today . This note is sent to the work in doctor.     PLAN Stressed the  importance of management of risk factors to prevent further stroke Continue Plavix and Eliquis for secondary stroke prevention and atrial fib Maintain strict control of hypertension  with blood pressure goal below 130/90, today's reading127/71 continue antihypertensive medications Control of diabetes with hemoglobin A1c below 6.5 followed by primary care most recent random glucose 155 continue diabetic medications Cholesterol with LDL cholesterol less than 70, followed by cardiology continue  statin drugs Exercise by walking, slowly increase , eat healthy diet with whole grains,  fresh fruits and vegetables Will check on sleep study, ordered split study sleep test F/U in 6 months Discussed risk for recurrent stroke/ TIA and answered additional questions This was a visit requiring 30 minutes and medical decision making of high complexity with extensive review of history, hospital chart, counseling and answering questions for patient and wife Dennie Bible, [facility], [facility], [facility] [address] [place], [place] [address] [phone]  I reviewed the above note and documentation by the Nurse Practitioner and agree with the history, physical exam, assessment and plan as outlined above. I was immediately available for face-to-face consultation. Star Age, MD, PhD [facility])

## 2016-11-13 ENCOUNTER — Other Ambulatory Visit: Payer: Self-pay | Admitting: Cardiovascular Disease

## 2016-11-13 NOTE — Telephone Encounter (Signed)
REFILL 

## 2016-11-24 ENCOUNTER — Ambulatory Visit (INDEPENDENT_AMBULATORY_CARE_PROVIDER_SITE_OTHER): Payer: BLUE CROSS/BLUE SHIELD | Admitting: Neurology

## 2016-11-24 DIAGNOSIS — G4733 Obstructive sleep apnea (adult) (pediatric): Secondary | ICD-10-CM

## 2016-11-24 DIAGNOSIS — R0683 Snoring: Secondary | ICD-10-CM

## 2016-12-03 ENCOUNTER — Ambulatory Visit (INDEPENDENT_AMBULATORY_CARE_PROVIDER_SITE_OTHER): Payer: BLUE CROSS/BLUE SHIELD

## 2016-12-03 ENCOUNTER — Ambulatory Visit (INDEPENDENT_AMBULATORY_CARE_PROVIDER_SITE_OTHER): Payer: BLUE CROSS/BLUE SHIELD | Admitting: Orthopaedic Surgery

## 2016-12-03 ENCOUNTER — Encounter (INDEPENDENT_AMBULATORY_CARE_PROVIDER_SITE_OTHER): Payer: Self-pay | Admitting: Orthopaedic Surgery

## 2016-12-03 VITALS — Ht 70.0 in | Wt 248.0 lb

## 2016-12-03 DIAGNOSIS — G8929 Other chronic pain: Secondary | ICD-10-CM | POA: Diagnosis not present

## 2016-12-03 DIAGNOSIS — M25512 Pain in left shoulder: Secondary | ICD-10-CM

## 2016-12-03 MED ORDER — LIDOCAINE HCL 1 % IJ SOLN
3.0000 mL | INTRAMUSCULAR | Status: AC | PRN
  Administered 2016-12-03: 3 mL

## 2016-12-03 MED ORDER — METHYLPREDNISOLONE ACETATE 40 MG/ML IJ SUSP
40.0000 mg | INTRAMUSCULAR | Status: AC | PRN
  Administered 2016-12-03: 40 mg via INTRA_ARTICULAR

## 2016-12-03 NOTE — Progress Notes (Signed)
   Procedure Note  Patient: [name]             Date of Birth: [birth year]           MRN: [id number]             Visit Date: 12/03/2016  Procedures: Visit Diagnoses: Chronic left shoulder pain - Plan: XR Shoulder Left  Large Joint Inj Date/Time: 12/03/2016 5:55 PM Performed by: Mcarthur Rossetti Authorized by: Mcarthur Rossetti   Location:  Shoulder Site:  L subacromial bursa Ultrasound Guidance: No   Fluoroscopic Guidance: No   Arthrogram: No   Medications:  3 mL lidocaine 1 %; 40 mg methylPREDNISolone acetate 40 MG/ML

## 2016-12-03 NOTE — Progress Notes (Signed)
Mr. Onorato is well-known to me. 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 Since I seen him last he's had multiple strokes. He has very poor perfusion of the posterior neck circulation to his brain. They've been working on diabetic control as well as blood lipid control. He seen at [facility] as well. He comes in today requesting a subacromial steroid injection in his left shoulder for the pain. He's been working on exercising and strength of the pain is certainly almost debilitating for him and his left shoulder. He understands fully the risks and benefits of steroid injections and does wish to have one. We had a long and thorough discussion about this.  On examination his left shoulder it is located in moves as a unit and she's just stiff and painful. 3 views of the shoulder show well located left shoulder. There is adequate space in the subacromial outlet.  Per his wishes I did prep the posterior lateral aspect of the shoulder that alcohol dried and a injection of 3 mL lidocaine and 1 mL of Depo-Medrol in subacromial space as tolerated well. We'll see him back as needed.

## 2016-12-04 ENCOUNTER — Encounter: Payer: Self-pay | Admitting: Vascular Surgery

## 2016-12-04 ENCOUNTER — Telehealth: Payer: Self-pay | Admitting: Neurology

## 2016-12-04 DIAGNOSIS — G4733 Obstructive sleep apnea (adult) (pediatric): Secondary | ICD-10-CM

## 2016-12-04 DIAGNOSIS — G4734 Idiopathic sleep related nonobstructive alveolar hypoventilation: Secondary | ICD-10-CM

## 2016-12-04 DIAGNOSIS — G4730 Sleep apnea, unspecified: Secondary | ICD-10-CM

## 2016-12-04 DIAGNOSIS — I739 Peripheral vascular disease, unspecified: Secondary | ICD-10-CM

## 2016-12-04 DIAGNOSIS — G4710 Hypersomnia, unspecified: Secondary | ICD-10-CM

## 2016-12-04 DIAGNOSIS — J449 Chronic obstructive pulmonary disease, unspecified: Secondary | ICD-10-CM

## 2016-12-04 NOTE — Telephone Encounter (Signed)
No order/ result note found.  PATIENT'S NAME:  [name], [name] DOB:      1955/09/23      MR#:    [id number]     DATE OF RECORDING: 11/24/2016 REFERRING M.D.:  Antony Contras, MD Study Performed:   Baseline Polysomnogram HISTORY:  Dr. Leonie Man has been concerned that this patient , who suffered a stroke, has additional risk factors  for OSA ; such as peripheral vascular disease, type 2 diabetes mellitus, and a history of a deep venous thrombosis, CAD,  chronic shoulder pain, plus history of kidney stones, hyperlipidemia, hypertension, peripheral neuropathy and skin cancer. The patient is on chronic anti coagulation. Patient snores and wife reported frequent apneas, also noted during hospitalization.  The patient endorsed the Epworth Sleepiness Scale at 13 points.   The patient's weight 247 pounds with a height of 70 (inches), resulting in a BMI of 35.3 kg/m2. The patient's neck circumference measured 18 inches.  CURRENT MEDICATIONS:  Eliquis, Lipitor, Plavix, Tricor, Neurontin, Novolog, Levemir, Lisinopril, Metformin, Toprol   PROCEDURE:  This is a multichannel digital polysomnogram utilizing the Somnostar 11.2 system.  Electrodes and sensors were applied and monitored per AASM Specifications.   EEG, EOG, Chin and Limb EMG, were sampled at 200 Hz.  ECG, Snore and Nasal Pressure, Thermal Airflow, Respiratory Effort, CPAP Flow and Pressure, Oximetry was sampled at 50 Hz. Digital video and audio were recorded.      BASELINE STUDY Lights Out was at 22:56 and Lights On at 05:00.  Total recording time (TRT) was 364.5 minutes, with a total sleep time (TST) of 319 minutes.   The patient's sleep latency was 5 minutes.  REM latency was 65.5 minutes.  The sleep efficiency was 87.5 %.     SLEEP ARCHITECTURE: WASO (Wake after sleep onset) was 19 minutes.  There were 19 minutes in Stage N1, 177 minutes Stage N2, 73.5 minutes Stage N3 and 49.5 minutes in Stage REM.  The percentage of Stage N1 was 6.%, Stage N2 was 55.5%,  Stage N3 was 23.% and Stage R (REM sleep) was 15.5%.   RESPIRATORY ANALYSIS:  There were a total of 88 respiratory events:  9 obstructive apneas, 0 central apneas and 2 mixed apneas with a total of 11 apneas and an apnea index (AI) of 2.1 /hour. There were 77 hypopneas with a hypopnea index of 14.5 /hour. The patient also had 0 respiratory event related arousals (RERAs). The total APNEA/HYPOPNEA INDEX (AHI) was 16.6/hour and the total RESPIRATORY DISTURBANCE INDEX was 16.6 /hour.  44 events occurred in REM sleep and 74 events in NREM. The REM AHI was 53.3 /hour, versus a non-REM AHI of 9.8. The patient slept all night in supine position and 0 minutes in non-supine.. The supine AHI was 16.6 versus a non-supine AHI of 0.0.  OXYGEN SATURATION & C02:  The Wake baseline 02 saturation was 96%, with the lowest being 65%. Time spent below 89% saturation equaled 53 minutes.  PERIODIC LIMB MOVEMENTS:   The patient had a total of 0 Periodic Limb Movements. The arousals were noted as: 12 were spontaneous, 0 were associated with PLMs, and 22 were associated with respiratory events.  Audio and video analysis did not show any abnormal or unusual movements, behaviors, phonations or vocalizations. Snoring was noted. The patient took one bathroom break. EKG was in keeping with normal sinus rhythm (NSR).    Post-study, the patient indicated that sleep was the same as usual.    IMPRESSION:  1. Mild - moderate Obstructive  Sleep Apnea (OSA AHI was 16.6 /hr.) with REM accentuation to Ahi 53.3/hr.  2. Prolonged hypoxemia, with SpO2 nadir of 65% and duration of total desaturation time of  53 minutes.  RECOMMENDATIONS:  1. Advise full-night, attended, CPAP titration study to optimize therapy.   2. Further information regarding OSA may be obtained from [employer] (www.sleepfoundation.org) or American Sleep Apnea Association (www.sleepapnea.org). 3. A follow up appointment will be scheduled in the Sleep  Clinic at [facility]. The referring provider will be notified of the results.      I certify that I have reviewed the entire raw data recording prior to the issuance of this report in accordance with the Standards of Accreditation of the American Academy of Sleep Medicine (AASM)     Larey Seat, MD  12-04-16 Diplomat, American Board of Psychiatry and Neurology  Diplomat, American Board of [place] Director, [place] [facility] at [facility]

## 2016-12-04 NOTE — Procedures (Signed)
PATIENT'S NAME:  [name], [name] DOB:      [birth year]      MR#:    [id number]     DATE OF RECORDING: 11/24/2016 REFERRING M.D.:  Antony Contras, MD Study Performed:   Baseline Polysomnogram HISTORY:  Dr. Leonie Man has been concerned that this patient , who suffered a stroke, has additional risk factors  for OSA ; such as peripheral vascular disease, type 2 diabetes mellitus, and a history of a deep venous thrombosis, CAD,  chronic shoulder pain, plus history of kidney stones, hyperlipidemia, hypertension, peripheral neuropathy and skin cancer. The patient is on chronic anti coagulation. Patient snores and wife reported frequent apneas, also noted during hospitalization.  The patient endorsed the Epworth Sleepiness Scale at 13 points.   The patient's weight 247 pounds with a height of 70 (inches), resulting in a BMI of 35.3 kg/m2. The patient's neck circumference measured 18 inches.  CURRENT MEDICATIONS:  Eliquis, Lipitor, Plavix, Tricor, Neurontin, Novolog, Levemir, Lisinopril, Metformin, Toprol   PROCEDURE:  This is a multichannel digital polysomnogram utilizing the Somnostar 11.2 system.  Electrodes and sensors were applied and monitored per AASM Specifications.   EEG, EOG, Chin and Limb EMG, were sampled at 200 Hz.  ECG, Snore and Nasal Pressure, Thermal Airflow, Respiratory Effort, CPAP Flow and Pressure, Oximetry was sampled at 50 Hz. Digital video and audio were recorded.      BASELINE STUDY Lights Out was at 22:56 and Lights On at 05:00.  Total recording time (TRT) was 364.5 minutes, with a total sleep time (TST) of 319 minutes.   The patient's sleep latency was 5 minutes.  REM latency was 65.5 minutes.  The sleep efficiency was 87.5 %.     SLEEP ARCHITECTURE: WASO (Wake after sleep onset) was 19 minutes.  There were 19 minutes in Stage N1, 177 minutes Stage N2, 73.5 minutes Stage N3 and 49.5 minutes in Stage REM.  The percentage of Stage N1 was 6.%, Stage N2 was 55.5%, Stage N3 was 23.% and Stage R (REM  sleep) was 15.5%.   RESPIRATORY ANALYSIS:  There were a total of 88 respiratory events:  9 obstructive apneas, 0 central apneas and 2 mixed apneas with a total of 11 apneas and an apnea index (AI) of 2.1 /hour. There were 77 hypopneas with a hypopnea index of 14.5 /hour. The patient also had 0 respiratory event related arousals (RERAs). The total APNEA/HYPOPNEA INDEX (AHI) was 16.6/hour and the total RESPIRATORY DISTURBANCE INDEX was 16.6 /hour.  44 events occurred in REM sleep and 74 events in NREM. The REM AHI was 53.3 /hour, versus a non-REM AHI of 9.8. The patient slept all night in supine position and 0 minutes in non-supine.. The supine AHI was 16.6 versus a non-supine AHI of 0.0.  OXYGEN SATURATION & C02:  The Wake baseline 02 saturation was 96%, with the lowest being 65%. Time spent below 89% saturation equaled 53 minutes.  PERIODIC LIMB MOVEMENTS:   The patient had a total of 0 Periodic Limb Movements. The arousals were noted as: 12 were spontaneous, 0 were associated with PLMs, and 22 were associated with respiratory events.  Audio and video analysis did not show any abnormal or unusual movements, behaviors, phonations or vocalizations. Snoring was noted. The patient took one bathroom break. EKG was in keeping with normal sinus rhythm (NSR).    Post-study, the patient indicated that sleep was the same as usual.    IMPRESSION:  1. Mild - moderate Obstructive Sleep Apnea (OSA AHI was 16.6 /  hr.) with REM accentuation to Ahi 53.3/hr.  2. Prolonged hypoxemia, with SpO2 nadir of 65% and duration of total desaturation time of  53 minutes.  RECOMMENDATIONS:  1. Advise full-night, attended, CPAP titration study to optimize therapy.   2. Further information regarding OSA may be obtained from [employer] (www.sleepfoundation.org) or American Sleep Apnea Association (www.sleepapnea.org). 3. A follow up appointment will be scheduled in the Sleep Clinic at [facility]. The referring provider will be notified of the results.      I certify that I have reviewed the entire raw data recording prior to the issuance of this report in accordance with the Standards of Accreditation of the American Academy of Sleep Medicine (AASM)     Larey Seat, MD  12-04-16 Diplomat, American Board of Psychiatry and Neurology  Diplomat, American Board of [place] Director, [place] [facility] at [facility]

## 2016-12-07 ENCOUNTER — Other Ambulatory Visit: Payer: Self-pay | Admitting: Pharmacist

## 2016-12-07 ENCOUNTER — Other Ambulatory Visit: Payer: Self-pay | Admitting: Cardiovascular Disease

## 2016-12-07 MED ORDER — APIXABAN 5 MG PO TABS: 5.0000 mg | ORAL_TABLET | Freq: Two times a day (BID) | ORAL | 5 refills | Status: DC

## 2016-12-09 NOTE — Telephone Encounter (Signed)
I called pt. I advised pt that Dr. Brett Fairy reviewed their sleep study results and found that pt has mild to moderate osa with REM accentuation with prolonged hypoxemia. Dr. Brett Fairy recommends that pt return for a repeat sleep study in order to properly titrate the cpap and ensure a good mask fit. Pt is agreeable to returning for a titration study. I advised pt that our sleep lab will file with pt's insurance and call pt to schedule the sleep study when we hear back from the pt's insurance regarding coverage of this sleep study. Pt verbalized understanding of results. Pt had no questions at this time but was encouraged to call back if questions arise.

## 2016-12-09 NOTE — Telephone Encounter (Signed)
I called pt to discuss his sleep study results. No answer, left a message asking him to call me back. 

## 2016-12-13 ENCOUNTER — Other Ambulatory Visit: Payer: Self-pay | Admitting: Cardiovascular Disease

## 2016-12-15 NOTE — Telephone Encounter (Signed)
Rx(s) sent to pharmacy electronically.  

## 2016-12-17 ENCOUNTER — Ambulatory Visit (INDEPENDENT_AMBULATORY_CARE_PROVIDER_SITE_OTHER): Payer: BLUE CROSS/BLUE SHIELD | Admitting: Vascular Surgery

## 2016-12-17 ENCOUNTER — Encounter: Payer: Self-pay | Admitting: Vascular Surgery

## 2016-12-17 ENCOUNTER — Ambulatory Visit (HOSPITAL_COMMUNITY)
Admission: RE | Admit: 2016-12-17 | Discharge: 2016-12-17 | Disposition: A | Discharge status: Home / Self Care | Payer: BLUE CROSS/BLUE SHIELD | Source: Ambulatory Visit | Attending: Vascular Surgery | Admitting: Vascular Surgery

## 2016-12-17 ENCOUNTER — Ambulatory Visit (INDEPENDENT_AMBULATORY_CARE_PROVIDER_SITE_OTHER)
Admission: RE | Admit: 2016-12-17 | Discharge: 2016-12-17 | Disposition: A | Discharge status: Home / Self Care | Payer: BLUE CROSS/BLUE SHIELD | Source: Ambulatory Visit | Attending: Vascular Surgery | Admitting: Vascular Surgery

## 2016-12-17 VITALS — BP 120/62 | HR 59 | Temp 98.4°F | Resp 18 | Ht 71.0 in | Wt 245.0 lb

## 2016-12-17 DIAGNOSIS — Z95828 Presence of other vascular implants and grafts: Secondary | ICD-10-CM | POA: Diagnosis not present

## 2016-12-17 DIAGNOSIS — I739 Peripheral vascular disease, unspecified: Secondary | ICD-10-CM

## 2016-12-17 DIAGNOSIS — R9439 Abnormal result of other cardiovascular function study: Secondary | ICD-10-CM | POA: Insufficient documentation

## 2016-12-17 NOTE — Progress Notes (Signed)
Patient is a 61 year old male who returns for follow-up today. He had a left superficial femoral to posterior tibial artery bypass using composite non-reversed right arm vein and PTFE February 2017. This was done for rest pain. He currently has no symptoms in his left leg. The patient had a significant stroke in the past year and is gradually regaining strength. He does still have diabetes which is fairly well controlled. He is not smoking. He is on Plavix and Eliquis.  Review of systems: He denies shortness of breath. He denies chest pain.  Physical exam:  Vitals:   12/17/16 1144  BP: 120/62  Pulse: (!) 59  Resp: 18  Temp: 98.4 F (36.9 C)  SpO2: 96%  Weight: 245 lb (111.1 kg)  Height: 5\' 11"  (1.803 m)    Extremities: 2+ left posterior tibial pulse Chest: Clear to auscultation bilaterally  Cardiac: Regular rate and rhythm  Data: Patient had a duplex ultrasound of his bypass graft today which shows the graft is widely patent with some size mismatch at the distal anastomosis. ABI was 1.24.   Assessment: Patent left lower extremity bypass graft.  Plan: The patient will follow-up in one year with a graft duplex scan repeat ABIs. He will see our nurse practitioner that office visit.  Ruta Hinds, MD [facility] Office: [phone] Pager: [phone]

## 2016-12-20 ENCOUNTER — Ambulatory Visit (INDEPENDENT_AMBULATORY_CARE_PROVIDER_SITE_OTHER): Payer: BLUE CROSS/BLUE SHIELD | Admitting: Neurology

## 2016-12-20 DIAGNOSIS — G4710 Hypersomnia, unspecified: Secondary | ICD-10-CM

## 2016-12-20 DIAGNOSIS — G4734 Idiopathic sleep related nonobstructive alveolar hypoventilation: Secondary | ICD-10-CM

## 2016-12-20 DIAGNOSIS — J449 Chronic obstructive pulmonary disease, unspecified: Secondary | ICD-10-CM

## 2016-12-20 DIAGNOSIS — G4733 Obstructive sleep apnea (adult) (pediatric): Secondary | ICD-10-CM | POA: Diagnosis not present

## 2016-12-20 DIAGNOSIS — I739 Peripheral vascular disease, unspecified: Secondary | ICD-10-CM

## 2016-12-20 DIAGNOSIS — G4730 Sleep apnea, unspecified: Secondary | ICD-10-CM

## 2016-12-21 IMAGING — MR MR LUMBAR SPINE W/O CM
4 of 5 series · 26 of 48 positions shown · non-contrast
Comparison: MRI dated 02/04/2005

CLINICAL DATA: Chronic low back pain. Numbness in the buttocks and
right leg.

EXAM:
MRI LUMBAR SPINE WITHOUT CONTRAST
TECHNIQUE: Multiplanar, multisequence MR imaging of the lumbar spine was
performed. No intravenous contrast was administered.

[Series 4: T1 · sagittal · 4.0mm · 0.55mm/px · 6 of 13 slices shown (1 of 2)]
[im 1/13]
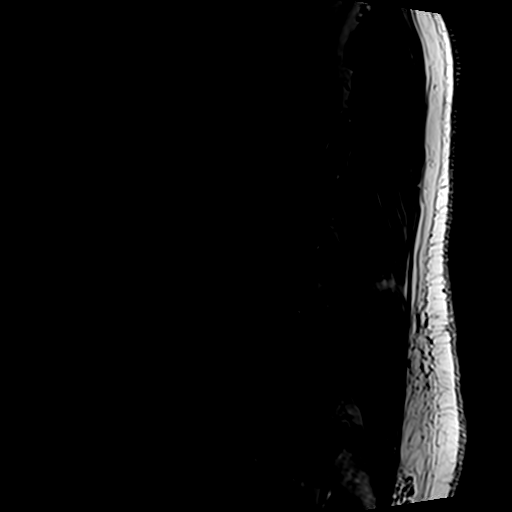
[im 3/13]
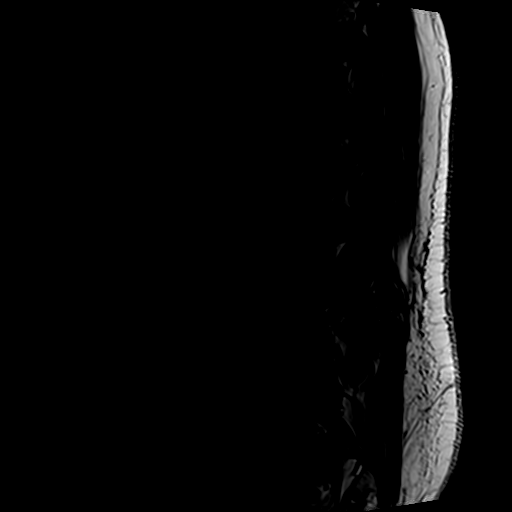
[im 5/13]
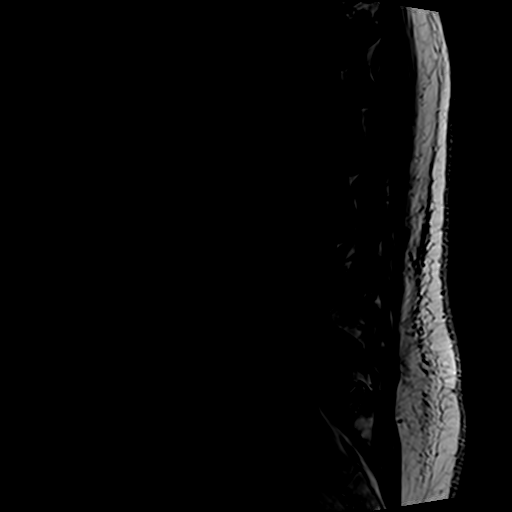
[im 8/13]
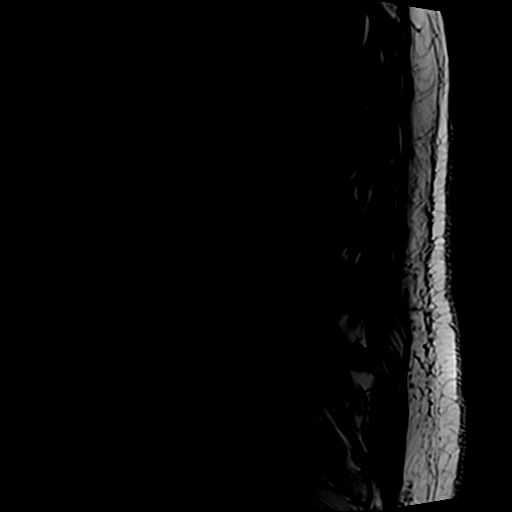
[im 10/13]
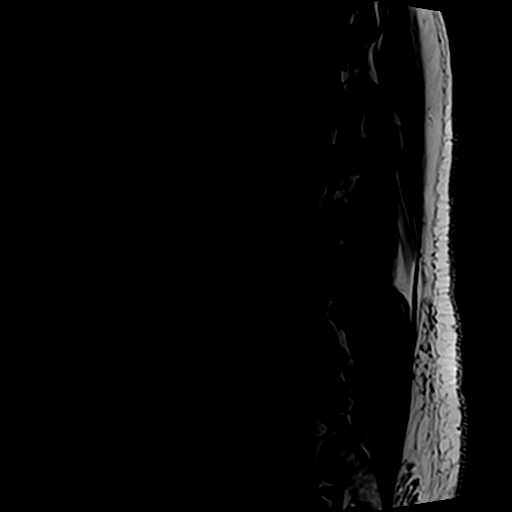
[im 13/13]
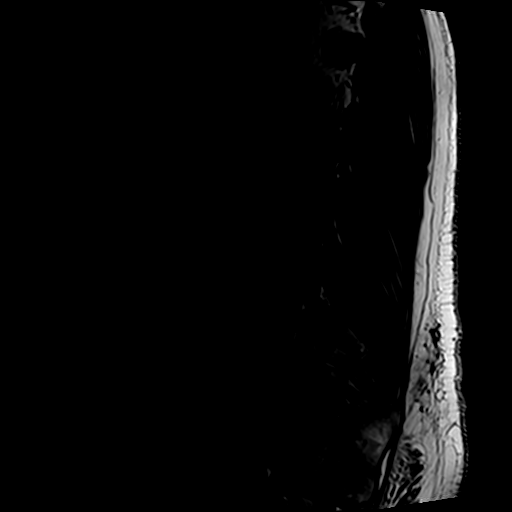

[Series 5: T2 post-contrast · sagittal · 4.0mm · 0.55mm/px · 6 of 13 slices shown]
[im 1/13]
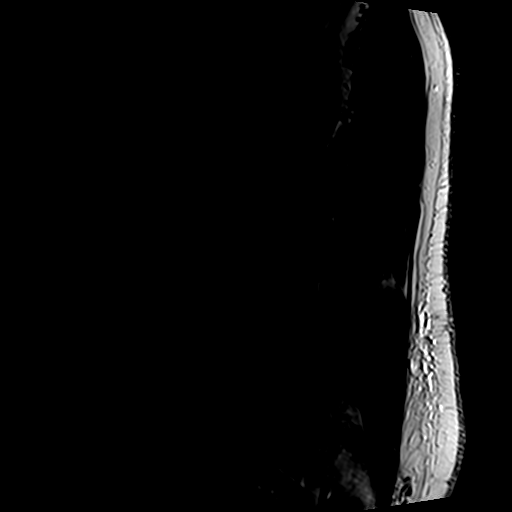
[im 3/13]
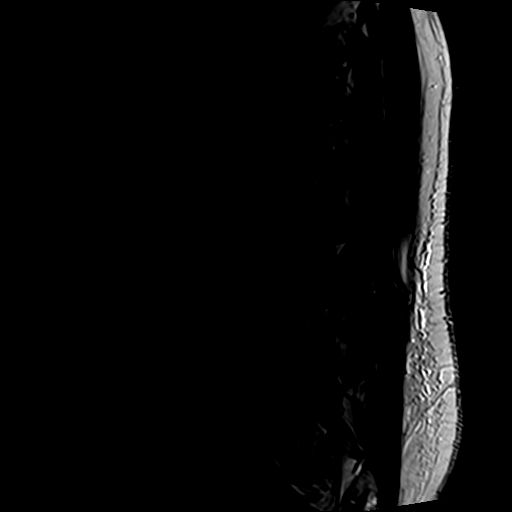
[im 5/13]
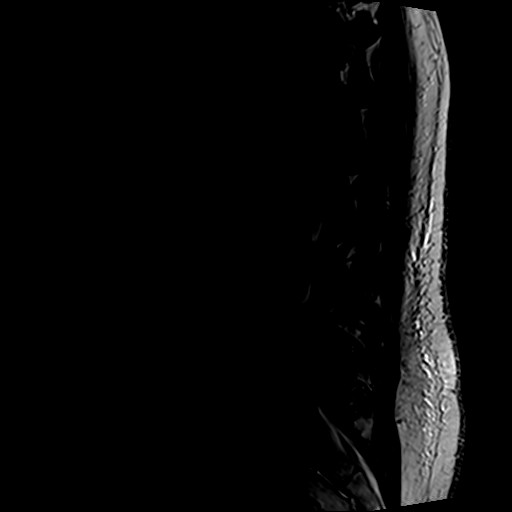
[im 8/13]
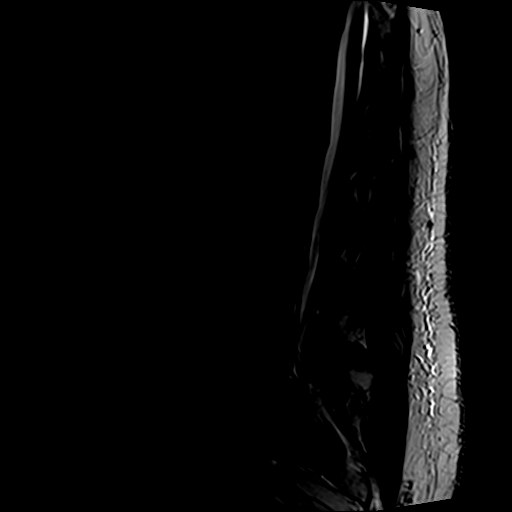
[im 10/13]
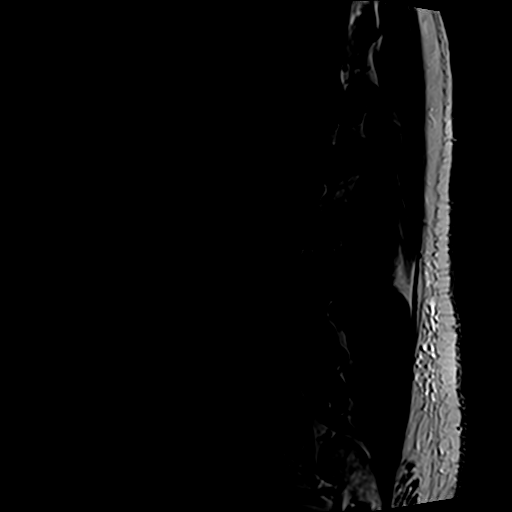
[im 13/13]
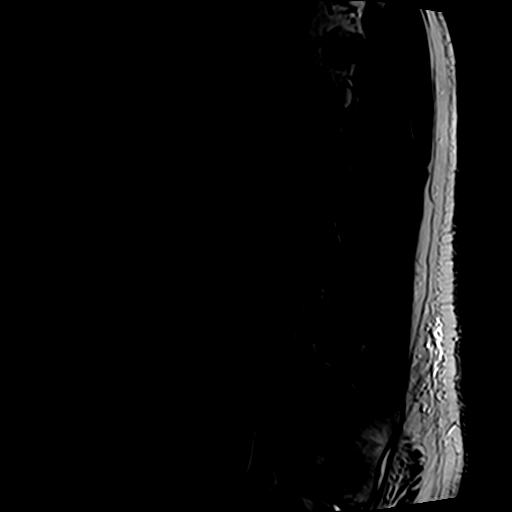

[Series 6: T2 · axial · 4.0mm · 0.70mm/px · z∈[-33,+148]mm · 9 of 33 slices shown]
[im 1/33]
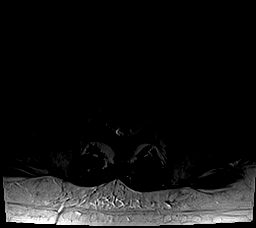
[im 5/33]
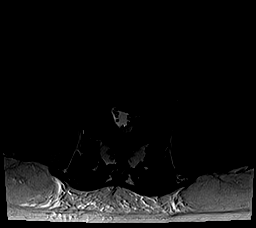
[im 10/33]
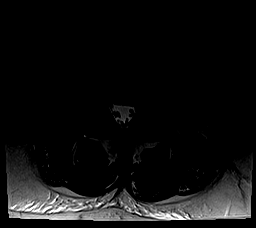
[im 14/33]
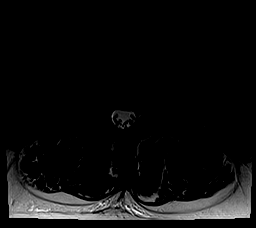
[im 17/33]
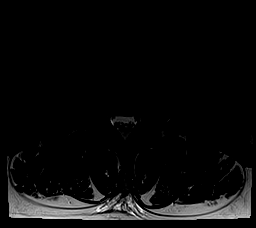
[im 19/33]
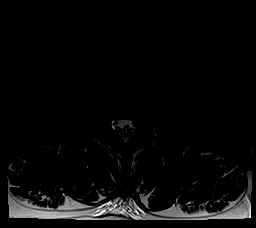
[im 23/33]
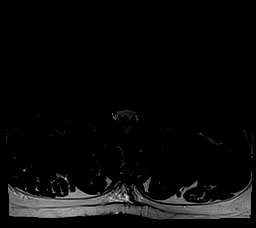
[im 28/33]
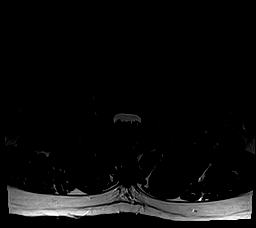
[im 33/33]
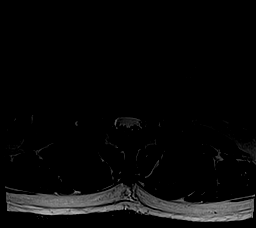

[Series 7: T1 · axial · 4.0mm · 0.35mm/px · z∈[-33,+123]mm · 5 of 33 slices shown (2 of 2)]
[im 1/33]
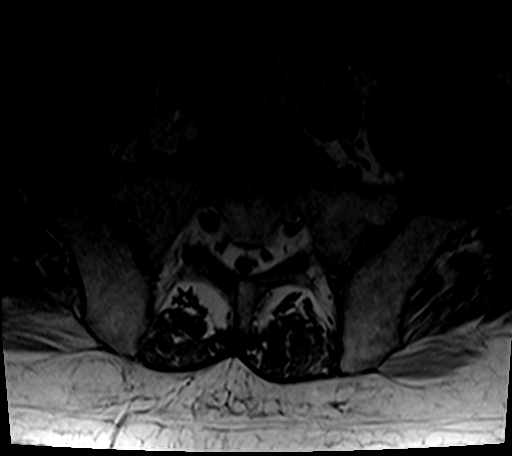
[im 5/33]
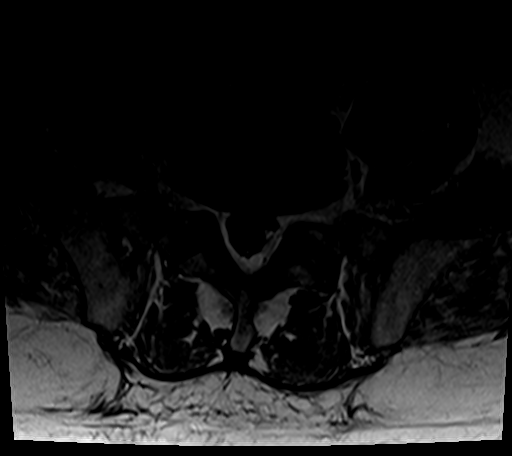
[im 10/33]
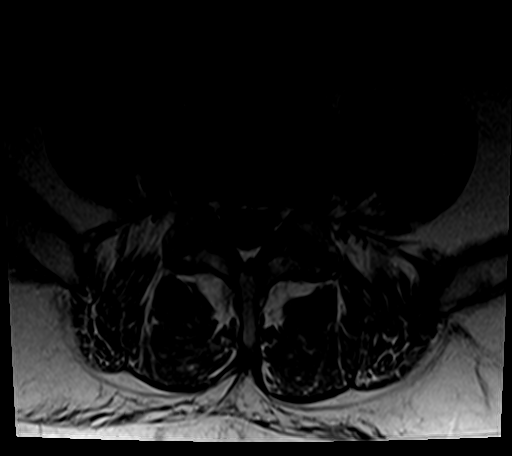
[im 17/33]
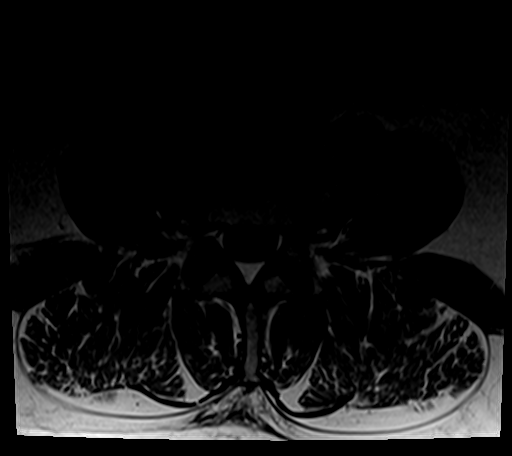
[im 28/33]
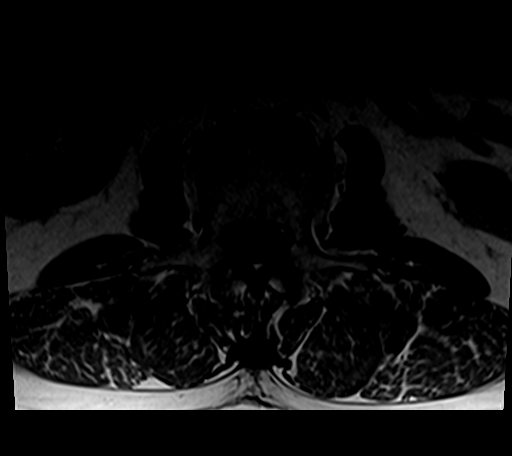

[26 of 48 positions shown; findings below may reference images not displayed]

FINDINGS: Normal conus tip at L1-2.  Normal paraspinal soft tissues.

T11-12 through L4-5:  Negative.

L5-S1: Bilateral pars defects without spondylolisthesis. Small
broad-based disc protrusion, more prominent centrally than on the
prior study but without neural impingement. No spinal or foraminal
stenosis.
IMPRESSION: Chronic bilateral pars defects at L5 with progressive degenerative
changes at the pars defects with increased degenerative disc disease
at L5-S1 with a small increased disc protrusion without neural
impingement.

No other significant abnormalities.

## 2016-12-23 NOTE — Addendum Note (Signed)
Addended by: Larey Seat on: 12/23/2016 02:19 PM   Modules accepted: Orders

## 2016-12-23 NOTE — Procedures (Signed)
PATIENT'S NAME:  [name], Broughton DOB:      02-20-1956      MR#:    [id number]     DATE OF RECORDING: 12/20/2016 REFERRING M.D.:  Antony Contras, MD Study Performed:   CPAP Titration HISTORY: This 61 year old male patient suffered a stroke and has additional risk factors such as peripheral vascular disease, type 2 diabetes mellitus, and had a deep venous thrombosis, has CAD, is morbidly obese. The patient has chronic shoulder pain. He snores and is excessively daytime sleepy. He returns for titratrion following a diagnostic PSG from 11-24-16, which resulted in an AHI 16.6, REM AHI of 53.3 and hypoxemia of 53 minutes, Sp02 nadir of 65% The patient endorsed the Epworth Sleepiness Scale at 13.  The patient's weight 248 pounds with a height of 70 (inches), resulting in a BMI of 35.3 kg/m2. The patient's neck circumference measured 18 inches.  CURRENT MEDICATIONS: Eliquis, Lipitor, Plavix, Tricor, Neurontin, Novolog, Levemir, Lisinopril, Metformin, Toprol PROCEDURE:  This is a multichannel digital polysomnogram utilizing the SomnoStar 11.2 system.  Electrodes and sensors were applied and monitored per AASM Specifications.   EEG, EOG, Chin and Limb EMG, were sampled at 200 Hz.  ECG, Snore and Nasal Pressure, Thermal Airflow, Respiratory Effort, CPAP Flow and Pressure, Oximetry was sampled at 50 Hz. Digital video and audio were recorded.      CPAP was initiated at 5.0 cmH20 with heated humidity per AASM split night standards and pressure was advanced to 14.0 cmH20 because of hypopneas, apneas and desaturations.  At a PAP pressure of 10 cmH20, there was a reduction of the AHI to 0.0 with improvement of the above symptoms of obstructive sleep apnea.    Lights Out was at 22:37 and Lights On at 04:57. Total recording time (TRT) was 381 minutes, with a total sleep time (TST) of 336 minutes. The patient's sleep latency was 34.5 minutes with 3 minutes of wake time after sleep onset. REM latency was 80.5 minutes.  The sleep  efficiency was 88.2 %.    SLEEP ARCHITECTURE: WASO (Wake after sleep onset) was 34 minutes.  There were 5 minutes in Stage N1, 262 minutes Stage N2, 0 minutes Stage N3 and 69 minutes in Stage REM.  The percentage of Stage N1 was 1.5%, Stage N2 was 78.%, Stage N3 was 0% and Stage R (REM sleep) was 20.5%.   RESPIRATORY ANALYSIS:  There were 21 respiratory events: 0 obstructive apneas, 6 central apneas and 15 hypopneas with 1 respiratory event related arousal (RERAs).  The total APNEA/HYPOPNEA INDEX (AHI) was 3.8 /hour and the total RESPIRATORY DISTURBANCE INDEX was 3.9 /hour.  9 events occurred in REM sleep and 12 events in NREM. The REM AHI was 7.8 /hour versus a non-REM AHI of 2.7 /hour.  The patient spent 336 minutes of total sleep time in the supine position and 0 minutes in non-supine. The supine AHI was 3.8, versus a non-supine AHI of 0.0.  OXYGEN SATURATION & C02:  The baseline 02 saturation was 98%, with the lowest being 84%. Time spent below 89% saturation equaled 7 minutes.  PERIODIC LIMB MOVEMENTS:  The patient had a total of 0 Periodic Limb Movements. The arousals were noted as: 27 were spontaneous, 0 were associated with PLMs, and 7 were associated with respiratory events. Audio and video analysis did not show any abnormal or unusual movements, behaviors, phonations or vocalizations.   The patient took 1 bathroom break. Loud snoring was noted. EKG mostly normal sinus rhythm (NSR) with some  isolated PVCs.  DIAGNOSIS CPAP did reduce the AHI at various pressure settings, but high pressures were associated with some additional apneas emerging. The best tolerated pressure was 10 cm water. The patient was fitted with a FFM, Simplus FFM by [employer] in medium size.  PLANS/RECOMMENDATIONS: auto-titration capable device to be provided with above named FFM, heated humidity and setting of 10 cm with 2 cm EPR.  Further advice: Educate patient in sleep hygiene measures.   a. Weight loss if  appropriate. b. Avoidance of medications with muscle relaxant properties. c. Avoidance of ingestion of alcohol prior to sleeping. d. Avoiding sleeping in the supine position (on one's back). e. Avoidance of driving if or when sleepy. A follow up appointment will be scheduled in the Sleep Clinic at [facility].   Please call [phone] with any questions.      I certify that I have reviewed the entire raw data recording prior to the issuance of this report in accordance with the Standards of Accreditation of the American Academy of Sleep Medicine (AASM)   Larey Seat, M.D.  12-23-2016 Diplomat, American Board of Psychiatry and Neurology  Diplomat, [place] [employer] Medical Director, [place] [facility] at [facility]

## 2016-12-24 ENCOUNTER — Telehealth: Payer: Self-pay

## 2016-12-24 NOTE — Telephone Encounter (Signed)
Referral sent to [facility].

## 2016-12-24 NOTE — Telephone Encounter (Signed)
I called pt. I advised pt that Dr. Brett Fairy reviewed their sleep study results and found that pt will need cpap machine. Pt is agreeable to starting a CPAP. I advised pt that an order will be sent to a DME, [facility], and they will call the pt within about one week after they file with the pt's insurance. They will show the pt how to use the machine, fit for masks, and troubleshoot the CPAP if needed. A follow up appt was made for insurance purposes with Dr. Brett Fairy on October 8th at 3:30pm. Pt verbalized understanding to arrive 15 minutes early and bring their CPAP. A letter with all of this information in it will be mailed to the pt as a reminder. I verified with the pt that the address we have on file is correct. Pt verbalized understanding of results. Pt had no questions at this time but was encouraged to call back if questions arise.

## 2016-12-25 NOTE — Addendum Note (Signed)
Addended by: Lianne Cure A on: 12/25/2016 02:34 PM   Modules accepted: Orders

## 2016-12-30 ENCOUNTER — Other Ambulatory Visit: Payer: Self-pay | Admitting: Cardiovascular Disease

## 2016-12-30 NOTE — Telephone Encounter (Signed)
Rx(s) sent to pharmacy electronically.  

## 2017-01-20 ENCOUNTER — Other Ambulatory Visit: Payer: Self-pay | Admitting: Cardiovascular Disease

## 2017-02-04 ENCOUNTER — Other Ambulatory Visit (HOSPITAL_COMMUNITY): Payer: Self-pay | Admitting: Interventional Radiology

## 2017-02-04 ENCOUNTER — Telehealth (HOSPITAL_COMMUNITY): Payer: Self-pay

## 2017-02-04 DIAGNOSIS — I771 Stricture of artery: Secondary | ICD-10-CM

## 2017-02-04 NOTE — Telephone Encounter (Signed)
Called to schedule 6 month f/u angio, left message for pt to return call. AW

## 2017-02-05 ENCOUNTER — Other Ambulatory Visit: Payer: Self-pay | Admitting: Internal Medicine

## 2017-02-11 ENCOUNTER — Other Ambulatory Visit: Payer: Self-pay | Admitting: Cardiovascular Disease

## 2017-02-15 ENCOUNTER — Telehealth: Payer: Self-pay | Admitting: Cardiovascular Disease

## 2017-02-15 MED ORDER — CLOPIDOGREL BISULFATE 75 MG PO TABS: 75.0000 mg | ORAL_TABLET | Freq: Every day | ORAL | 1 refills | Status: DC

## 2017-02-15 NOTE — Telephone Encounter (Signed)
New Message      *STAT* If patient is at the pharmacy, call can be transferred to refill team.   1. Which medications need to be refilled? (please list name of each medication and dose if known)  clopidogrel (PLAVIX) 75 MG tablet TAKE 1 TABLET (75 MG TOTAL) BY MOUTH DAILY. <PLEASE MAKE APPOINTMENT FOR REFILLS>     2. Which pharmacy/location (including street and city if local pharmacy) is medication to be sent to? [facility] on [address]   3. Do they need a 30 day or 90 day supply?  Has appt 04/02/17

## 2017-02-15 NOTE — Telephone Encounter (Signed)
Rx has been sent to the pharmacy electronically. ° °

## 2017-02-26 ENCOUNTER — Other Ambulatory Visit: Payer: Self-pay | Admitting: Radiology

## 2017-03-01 ENCOUNTER — Other Ambulatory Visit: Payer: Self-pay | Admitting: Radiology

## 2017-03-02 ENCOUNTER — Ambulatory Visit (HOSPITAL_COMMUNITY)
Admission: RE | Admit: 2017-03-02 | Discharge: 2017-03-02 | Disposition: A | Discharge status: Home / Self Care | Payer: BLUE CROSS/BLUE SHIELD | Source: Ambulatory Visit | Attending: Interventional Radiology | Admitting: Interventional Radiology

## 2017-03-02 ENCOUNTER — Other Ambulatory Visit (HOSPITAL_COMMUNITY): Payer: Self-pay | Admitting: Interventional Radiology

## 2017-03-02 ENCOUNTER — Encounter (HOSPITAL_COMMUNITY): Payer: Self-pay

## 2017-03-02 DIAGNOSIS — G8929 Other chronic pain: Secondary | ICD-10-CM | POA: Diagnosis not present

## 2017-03-02 DIAGNOSIS — E785 Hyperlipidemia, unspecified: Secondary | ICD-10-CM | POA: Insufficient documentation

## 2017-03-02 DIAGNOSIS — Z794 Long term (current) use of insulin: Secondary | ICD-10-CM | POA: Diagnosis not present

## 2017-03-02 DIAGNOSIS — M25512 Pain in left shoulder: Secondary | ICD-10-CM | POA: Diagnosis not present

## 2017-03-02 DIAGNOSIS — G450 Vertebro-basilar artery syndrome: Secondary | ICD-10-CM | POA: Insufficient documentation

## 2017-03-02 DIAGNOSIS — Z7901 Long term (current) use of anticoagulants: Secondary | ICD-10-CM | POA: Diagnosis not present

## 2017-03-02 DIAGNOSIS — I2510 Atherosclerotic heart disease of native coronary artery without angina pectoris: Secondary | ICD-10-CM | POA: Insufficient documentation

## 2017-03-02 DIAGNOSIS — I10 Essential (primary) hypertension: Secondary | ICD-10-CM | POA: Insufficient documentation

## 2017-03-02 DIAGNOSIS — I252 Old myocardial infarction: Secondary | ICD-10-CM | POA: Insufficient documentation

## 2017-03-02 DIAGNOSIS — Z86718 Personal history of other venous thrombosis and embolism: Secondary | ICD-10-CM | POA: Diagnosis not present

## 2017-03-02 DIAGNOSIS — Z951 Presence of aortocoronary bypass graft: Secondary | ICD-10-CM | POA: Insufficient documentation

## 2017-03-02 DIAGNOSIS — M25511 Pain in right shoulder: Secondary | ICD-10-CM | POA: Insufficient documentation

## 2017-03-02 DIAGNOSIS — E1142 Type 2 diabetes mellitus with diabetic polyneuropathy: Secondary | ICD-10-CM | POA: Insufficient documentation

## 2017-03-02 DIAGNOSIS — Z8673 Personal history of transient ischemic attack (TIA), and cerebral infarction without residual deficits: Secondary | ICD-10-CM | POA: Insufficient documentation

## 2017-03-02 DIAGNOSIS — I771 Stricture of artery: Secondary | ICD-10-CM

## 2017-03-02 DIAGNOSIS — E1151 Type 2 diabetes mellitus with diabetic peripheral angiopathy without gangrene: Secondary | ICD-10-CM | POA: Insufficient documentation

## 2017-03-02 DIAGNOSIS — Z7902 Long term (current) use of antithrombotics/antiplatelets: Secondary | ICD-10-CM | POA: Insufficient documentation

## 2017-03-02 HISTORY — PX: IR ANGIO VERTEBRAL SEL VERTEBRAL UNI L MOD SED: IMG5367

## 2017-03-02 HISTORY — PX: IR ANGIOGRAM EXTREMITY RIGHT: IMG652

## 2017-03-02 HISTORY — PX: IR ANGIO INTRA EXTRACRAN SEL COM CAROTID INNOMINATE BILAT MOD SED: IMG5360

## 2017-03-02 LAB — BASIC METABOLIC PANEL
Anion gap: 8 (ref 5–15)
BUN: 23 mg/dL — AB (ref 6–20)
CHLORIDE: 106 mmol/L (ref 101–111)
CO2: 24 mmol/L (ref 22–32)
Calcium: 9.5 mg/dL (ref 8.9–10.3)
Creatinine, Ser: 1.23 mg/dL (ref 0.61–1.24)
GFR calc Af Amer: >60 mL/min (ref >60)
GFR calc non Af Amer: >60 mL/min (ref >60)
Glucose, Bld: 271 mg/dL — ABNORMAL HIGH (ref 65–99)
POTASSIUM: 4.4 mmol/L (ref 3.5–5.1)
SODIUM: 138 mmol/L (ref 135–145)

## 2017-03-02 LAB — CBC
HEMATOCRIT: 40.4 % (ref 39.0–52.0)
Hemoglobin: 13.2 g/dL (ref 13.0–17.0)
MCH: 28.1 pg (ref 26.0–34.0)
MCHC: 32.7 g/dL (ref 30.0–36.0)
MCV: 86.1 fL (ref 78.0–100.0)
Platelets: 249 10*3/uL (ref 150–400)
RBC: 4.69 MIL/uL (ref 4.22–5.81)
RDW: 13.5 % (ref 11.5–15.5)
WBC: 8.1 10*3/uL (ref 4.0–10.5)

## 2017-03-02 LAB — GLUCOSE, CAPILLARY
GLUCOSE-CAPILLARY: 240 mg/dL — AB (ref 65–99)
GLUCOSE-CAPILLARY: 202 mg/dL — AB (ref 65–99)

## 2017-03-02 LAB — APTT: aPTT: 27 seconds (ref 24–36)

## 2017-03-02 LAB — PROTIME-INR
INR: 0.99
PROTHROMBIN TIME: 13 s (ref 11.4–15.2)

## 2017-03-02 MED ORDER — HYDRALAZINE HCL 20 MG/ML IJ SOLN
INTRAMUSCULAR | Status: AC
  Filled 2017-03-02: qty 1

## 2017-03-02 MED ORDER — INSULIN ASPART 100 UNIT/ML ~~LOC~~ SOLN
3.0000 [IU] | Freq: Once | SUBCUTANEOUS | Status: AC
  Administered 2017-03-02: 3 [IU] via SUBCUTANEOUS

## 2017-03-02 MED ORDER — IOPAMIDOL (ISOVUE-300) INJECTION 61%
INTRAVENOUS | Status: AC
  Administered 2017-03-02: 72 mL
  Filled 2017-03-02: qty 150

## 2017-03-02 MED ORDER — BUPIVACAINE HCL (PF) 0.25 % IJ SOLN
INTRAMUSCULAR | Status: AC
  Filled 2017-03-02: qty 30

## 2017-03-02 MED ORDER — MIDAZOLAM HCL 2 MG/2ML IJ SOLN
INTRAMUSCULAR | Status: AC | PRN
  Administered 2017-03-02: 1 mg via INTRAVENOUS

## 2017-03-02 MED ORDER — SODIUM CHLORIDE 0.9 % IV SOLN: INTRAVENOUS | Status: AC

## 2017-03-02 MED ORDER — INSULIN ASPART 100 UNIT/ML ~~LOC~~ SOLN
SUBCUTANEOUS | Status: AC
  Administered 2017-03-02: 3 [IU] via SUBCUTANEOUS
  Filled 2017-03-02: qty 1

## 2017-03-02 MED ORDER — SODIUM CHLORIDE 0.9 % IV SOLN
Freq: Once | INTRAVENOUS | Status: AC
  Administered 2017-03-02: 08:00:00 via INTRAVENOUS

## 2017-03-02 MED ORDER — FENTANYL CITRATE (PF) 100 MCG/2ML IJ SOLN
INTRAMUSCULAR | Status: AC | PRN
  Administered 2017-03-02: 25 ug via INTRAVENOUS

## 2017-03-02 MED ORDER — FENTANYL CITRATE (PF) 100 MCG/2ML IJ SOLN
INTRAMUSCULAR | Status: AC
  Filled 2017-03-02: qty 2

## 2017-03-02 MED ORDER — IOPAMIDOL (ISOVUE-300) INJECTION 61%
INTRAVENOUS | Status: AC
  Filled 2017-03-02: qty 50

## 2017-03-02 MED ORDER — LIDOCAINE HCL (PF) 1 % IJ SOLN
INTRAMUSCULAR | Status: AC | PRN
  Administered 2017-03-02: 5 mL

## 2017-03-02 MED ORDER — HEPARIN SODIUM (PORCINE) 1000 UNIT/ML IJ SOLN
INTRAMUSCULAR | Status: AC | PRN
  Administered 2017-03-02: 1000 [IU] via INTRAVENOUS

## 2017-03-02 MED ORDER — LIDOCAINE HCL (PF) 1 % IJ SOLN
INTRAMUSCULAR | Status: AC
  Filled 2017-03-02: qty 30

## 2017-03-02 MED ORDER — HYDRALAZINE HCL 20 MG/ML IJ SOLN
INTRAMUSCULAR | Status: AC | PRN
  Administered 2017-03-02: 5 mg via INTRAVENOUS

## 2017-03-02 MED ORDER — MIDAZOLAM HCL 2 MG/2ML IJ SOLN
INTRAMUSCULAR | Status: AC
  Filled 2017-03-02: qty 2

## 2017-03-02 MED ORDER — HEPARIN SODIUM (PORCINE) 1000 UNIT/ML IJ SOLN
INTRAMUSCULAR | Status: AC
  Filled 2017-03-02: qty 1

## 2017-03-02 NOTE — Sedation Documentation (Signed)
Pressure hold complete- bedrest 4 hrs, will be complete at 1445

## 2017-03-02 NOTE — Sedation Documentation (Signed)
dsg in place R groin

## 2017-03-02 NOTE — Sedation Documentation (Signed)
250cc NS IV Bolus given, (creatinine 1.23)

## 2017-03-02 NOTE — Procedures (Signed)
S/P bilateral common carotid arterigram and Lt vert arterigram. RT CFA approach. Findings. 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

## 2017-03-02 NOTE — Sedation Documentation (Signed)
Patient is resting comfortably. 

## 2017-03-02 NOTE — Sedation Documentation (Signed)
Pt oozing at groin site- IR team holding more pressure

## 2017-03-02 NOTE — Sedation Documentation (Signed)
Pressure hold R groin

## 2017-03-02 NOTE — Discharge Instructions (Signed)
Angiogram, Care After °This sheet gives you information about how to care for yourself after your procedure. Your health care provider may also give you more specific instructions. If you have problems or questions, contact your health care provider. °What can I expect after the procedure? °After the procedure, it is common to have bruising and tenderness at the catheter insertion area. °Follow these instructions at home: °Insertion site care  °· Follow instructions from your health care provider about how to take care of your insertion site. Make sure you: °¨ Wash your hands with soap and water before you change your bandage (dressing). If soap and water are not available, use hand sanitizer. °¨ Change your dressing as told by your health care provider. °¨ Leave stitches (sutures), skin glue, or adhesive strips in place. These skin closures may need to stay in place for 2 weeks or longer. If adhesive strip edges start to loosen and curl up, you may trim the loose edges. Do not remove adhesive strips completely unless your health care provider tells you to do that. °· Do not take baths, swim, or use a hot tub until your health care provider approves. °· You may shower 24-48 hours after the procedure or as told by your health care provider. °¨ Gently wash the site with plain soap and water. °¨ Pat the area dry with a clean towel. °¨ Do not rub the site. This may cause bleeding. °· Do not apply powder or lotion to the site. Keep the site clean and dry. °· Check your insertion site every day for signs of infection. Check for: °¨ Redness, swelling, or pain. °¨ Fluid or blood. °¨ Warmth. °¨ Pus or a bad smell. °Activity  °· Rest as told by your health care provider, usually for 1-2 days. °· Do not lift anything that is heavier than 10 lbs. (4.5 kg) or as told by your health care provider. °· Do not drive for 24 hours if you were given a medicine to help you relax (sedative). °· Do not drive or use heavy machinery while  taking prescription pain medicine. °General instructions  °· Return to your normal activities as told by your health care provider, usually in about a week. Ask your health care provider what activities are safe for you. °· If the catheter site starts bleeding, lie flat and put pressure on the site. If the bleeding does not stop, get help right away. This is a medical emergency. °· Drink enough fluid to keep your urine clear or pale yellow. This helps flush the contrast dye from your body. °· Take over-the-counter and prescription medicines only as told by your health care provider. °· Keep all follow-up visits as told by your health care provider. This is important. °Contact a health care provider if: °· You have a fever or chills. °· You have redness, swelling, or pain around your insertion site. °· You have fluid or blood coming from your insertion site. °· The insertion site feels warm to the touch. °· You have pus or a bad smell coming from your insertion site. °· You have bruising around the insertion site. °· You notice blood collecting in the tissue around the catheter site (hematoma). The hematoma may be painful to the touch. °Get help right away if: °· You have severe pain at the catheter insertion area. °· The catheter insertion area swells very fast. °· The catheter insertion area is bleeding, and the bleeding does not stop when you hold steady pressure on   the area. °· The area near or just beyond the catheter insertion site becomes pale, cool, tingly, or numb. °These symptoms may represent a serious problem that is an emergency. Do not wait to see if the symptoms will go away. Get medical help right away. Call your local emergency services (911 in the [place] Do not drive yourself to the hospital. °Summary °· After the procedure, it is common to have bruising and tenderness at the catheter insertion area. °· After the procedure, it is important to rest and drink plenty of fluids. °· Do not take baths,  swim, or use a hot tub until your health care provider says it is okay to do so. You may shower 24-48 hours after the procedure or as told by your health care provider. °· If the catheter site starts bleeding, lie flat and put pressure on the site. If the bleeding does not stop, get help right away. This is a medical emergency. °This information is not intended to replace advice given to you by your health care provider. Make sure you discuss any questions you have with your health care provider. °Document Released: 01/22/2005 Document Revised: 06/10/2016 Document Reviewed: 06/10/2016 °Elsevier Interactive Patient Education © 2017 [employer] ° °

## 2017-03-02 NOTE — Sedation Documentation (Signed)
Blood sugar recheck- 202

## 2017-03-02 NOTE — Sedation Documentation (Signed)
IR team continues pressure hold

## 2017-03-02 NOTE — H&P (Signed)
Chief Complaint: Patient was seen in consultation today for cerebral arteriogram at the request of Dr Royal Hawthorn  Referring Physician(s): Dr Royal Hawthorn  Supervising Physician: Luanne Bras  Patient Status: [facility] - Out-pt  History of Present Illness: [name] is a 61 y.o. male   Hx CVA Known vertebral and basilar artery stenosis Basilar artery angioplasty/stent placement 01/2016 07/2016 follow up arteriogram: IMPRESSION: Wide patency of the previously angioplastied and stented segment of the mid basilar artery with a 90% patency. Approximately less than 50% stenosis of the proximal basilar artery at the site of the proximal struts, and of the left vertebrobasilar junction just proximal to the left posterior-inferior cerebellar artery seen only on the AP projection. Wide patency demonstrated on the lateral projection in these regions. 50% stenosis of the internal carotid arteries bilaterally in the supraclinoid segments. Approximately 50% stenosis of mid M1 segment of the right middle cerebral artery. High-grade stenosis of the right vertebrobasilar junction just distal to the origin of the right posterior-inferior cerebellar artery. This has remained stable angiographically compared to the previous arteriogram of 01/30/2016.  Has done well since stenting Non smoker Speech still with some difficulties but better Denies headaches; Nausea Some leg weakness but does not use walker or cane LD Eliquis Sat (8/11) Blood sugar 240 this am; asymptomatic Scheduled for recheck cerebral arteriogram   Past Medical History:  Diagnosis Date  . Arthritis    "hx; cleaned it out of both shoulders"  . CAD (coronary artery disease)    OV, Dr Harlow Asa, MYOVIEW 5/12 on chart  EKG 10/12 EPIC,  chest x ray 01/07/11 EPIC  . Carpal tunnel syndrome    peripheral neuropathy  . Chronic shoulder pain    "both"  . DVT (deep venous thrombosis) (HCC)    hx LLE  . History of kidney stones    . Hyperlipidemia   . Hypertension   . Myocardial infarction ([place]) 02/2001  . Neuropathy, peripheral    both feet  . Peripheral vascular disease, unspecified ([place]) 03/2015   PCI to the right popliteal  . Pseudobulbar affect   . Skin cancer    "have had them cut or burned off my face" (03/28/2015)  . Stroke ([place]) 10/10/2015  . Type II diabetes mellitus ([place])     Past Surgical History:  Procedure Laterality Date  . APPENDECTOMY  1977  . CARDIAC CATHETERIZATION  2002      . CARPAL TUNNEL RELEASE Right 2000's  . CARPAL TUNNEL RELEASE  12/18/2011   Procedure: CARPAL TUNNEL RELEASE;  Surgeon: Mcarthur Rossetti, MD;  Location: [facility];  Service: Orthopedics;  Laterality: Left;  Left Open Carpal Tunnel Release  . CORONARY ANGIOPLASTY    . CORONARY ARTERY BYPASS GRAFT  2002   CABG X 4  . CYSTOSCOPY  several done in past  . FEMORAL-TIBIAL BYPASS GRAFT Left 01/07/11   fem-posterior tibial BPG using reversed left GSV               12/15/11 OK BY DR Ninfa Linden TO CONTINUE ASA AND PLAVIX  . FEMORAL-TIBIAL BYPASS GRAFT Left 09/06/2015   Procedure: LEFT FEMORAL-POSTERIOR TIBIAL ARTERY BYPASS GRAFT WITH COMPOSITE PTFE AND RIGHT ARM VEIN;  Surgeon: Elam Dutch, MD;  Location: [place];  Service: Vascular;  Laterality: Left;  . FEMOROPOPLITEAL THROMBECTOMY / EMBOLECTOMY  ~ 2010  . FRACTURE SURGERY    . IR GENERIC HISTORICAL  02/18/2016   IR RADIOLOGIST EVAL & MGMT 02/18/2016 [facility]  . IR  GENERIC HISTORICAL  07/30/2016   IR ANGIO VERTEBRAL SEL VERTEBRAL UNI L MOD SED 07/30/2016 Luanne Bras, MD [facility]  . IR GENERIC HISTORICAL  07/30/2016   IR ANGIO INTRA EXTRACRAN SEL COM CAROTID INNOMINATE BILAT MOD SED 07/30/2016 Luanne Bras, MD [facility]  . KNEE ARTHROSCOPY Left X 2  . LAPAROSCOPIC CHOLECYSTECTOMY  1990's  . LITHOTRIPSY  several done in past  . ORIF RADIUS & ULNA FRACTURES Left   . PERIPHERAL VASCULAR CATHETERIZATION N/A 03/28/2015   Procedure: Lower Extremity Angiography;   Surgeon: Lorretta Harp, MD;  Location: [place] [facility];  Service: Cardiovascular;  Laterality: N/A;  . PERIPHERAL VASCULAR CATHETERIZATION Right 03/28/2015   Procedure: Peripheral Vascular Atherectomy;  Surgeon: Lorretta Harp, MD;  Location: [place] [facility];  Service: Cardiovascular;  Laterality: Right;  popliteal;   . PERIPHERAL VASCULAR CATHETERIZATION N/A 08/23/2015   Procedure: Abdominal Aortogram;  Surgeon: Elam Dutch, MD;  Location: [place] [facility];  Service: Cardiovascular;  Laterality: N/A;  . POPLITEAL ARTERY STENT Left 2010-2012 X 4  . RADIOLOGY WITH ANESTHESIA N/A 02/04/2016   Procedure: Basilar artery angioplasty with stenting;  Surgeon: Luanne Bras, MD;  Location: [place];  Service: Radiology;  Laterality: N/A;  . SHOULDER ARTHROSCOPY Left   . SHOULDER ARTHROSCOPY Right 12/18/2011  . SHOULDER ARTHROSCOPY  07/08/2012   Procedure: ARTHROSCOPY SHOULDER;  Surgeon: Mcarthur Rossetti, MD;  Location: [facility];  Service: Orthopedics;  Laterality: Left;  Left Shoulder Arthroscopy with Manipulation and Extensive Debridement  . SHOULDER ARTHROSCOPY WITH ROTATOR CUFF REPAIR Left 07/14/2013   Procedure: LEFT SHOULDER ARTHROSCOPY WITH EXTENSIVE DEBRIDEMENT, DISTAL CLAVICLE REPAIR;  Surgeon: Mcarthur Rossetti, MD;  Location[facility];  Service: Orthopedics;  Laterality: Left;  . SKIN CANCER EXCISION     "left side of my forehead"  . VEIN HARVEST Right 09/06/2015   Procedure: RIGHT ARM VEIN HARVEST;  Surgeon: Elam Dutch, MD;  Location: [place];  Service: Vascular;  Laterality: Right;    Allergies: Patient has no known allergies.  Medications: Prior to Admission medications   Medication Sig Start Date End Date Taking? Authorizing Provider  apixaban (ELIQUIS) 5 MG TABS tablet Take 1 tablet (5 mg total) by mouth 2 (two) times daily. 12/07/16  Yes Lorretta Harp, MD  clopidogrel (PLAVIX) 75 MG tablet Take 1 tablet (75 mg total) by mouth daily. Keep appointment 09/04  for more refills 02/15/17  Yes Lorretta Harp, MD  dapagliflozin propanediol (FARXIGA) 10 MG TABS tablet Take 10 mg by mouth daily. 07/31/16 07/31/17 Yes [provider]  fenofibrate (TRICOR) 145 MG tablet TAKE 1 TABLET (145 MG TOTAL) BY MOUTH DAILY. 02/11/17  Yes Lorretta Harp, MD  gabapentin (NEURONTIN) 300 MG capsule Take 300-600 mg by mouth 2 (two) times daily. Take 300mg  in the morning and 600mg  in the evening.   Yes [provider]  insulin aspart (NOVOLOG) 100 UNIT/ML injection Inject 40 Units into the skin 3 (three) times daily before meals. 10/12/15  Yes Dhungel, Nishant, MD  LEVEMIR FLEXTOUCH 100 UNIT/ML Pen Inject 80 Units into the skin 2 (two) times daily. Reported on 08/21/2015 Patient taking differently: Inject 75 Units into the skin 2 (two) times daily. Reported on 08/21/2015 10/12/15  Yes Dhungel, Nishant, MD  lisinopril-hydrochlorothiazide (PRINZIDE,ZESTORETIC) 20-12.5 MG per tablet Take 1 tablet by mouth daily with breakfast.    Yes [provider]  metFORMIN (GLUCOPHAGE-XR) 500 MG 24 hr tablet Take 1 tablet (500 mg total) by mouth 2 (  two) times daily. 02/01/16  Yes Hongalgi, Lenis Dickinson, MD  metoprolol succinate (TOPROL-XL) 25 MG 24 hr tablet Take 1 tablet (25 mg total) by mouth daily. 03/24/16  Yes Lorretta Harp, MD  naproxen sodium (ALEVE) 220 MG tablet Take 220 mg by mouth daily.   Yes [provider]  niacin 500 MG tablet Take 500 mg by mouth daily after supper.   Yes [provider]     Family History  Problem Relation Age of Onset  . Cancer Mother        Breast and Brain tumor  . Cancer Father        Blood vessel tumor    Social History   Social History  . Marital status: Widowed    Spouse name: N/A  . Number of children: N/A  . Years of education: N/A   Social History Main Topics  . Smoking status: Never Smoker  . Smokeless tobacco: Never Used  . Alcohol use No  . Drug use: No  . Sexual activity: Yes   Other Topics  Concern  . None   Social History Narrative  . None    Review of Systems: A 12 point ROS discussed and pertinent positives are indicated in the HPI above.  All other systems are negative.  Review of Systems  Constitutional: Negative for activity change and fatigue.  Respiratory: Negative for shortness of breath.   Cardiovascular: Negative for chest pain.  Gastrointestinal: Negative for abdominal pain.  Musculoskeletal: Negative for gait problem.  Neurological: Positive for speech difficulty. Negative for dizziness, tremors, seizures, syncope, facial asymmetry, weakness, light-headedness, numbness and headaches.  Psychiatric/Behavioral: Negative for behavioral problems and confusion.    Vital Signs: BP (!) 159/88 (BP Location: Right Arm)   Pulse 78   Temp 98.1 F (36.7 C) (Oral)   Ht 5\' 10"  (1.778 m)   Wt 235 lb (106.6 kg)   SpO2 95%   BMI 33.72 kg/m   Physical Exam  Constitutional: He is oriented to person, place, and time. He appears well-nourished.  Speech with noticeable slowness; but this is new normal for pt since CVA  Cardiovascular: Normal rate, regular rhythm and normal heart sounds.   Pulmonary/Chest: Effort normal and breath sounds normal.  Abdominal: Soft. Bowel sounds are normal.  Musculoskeletal: Normal range of motion.  Neurological: He is alert and oriented to person, place, and time.  Skin: Skin is warm and dry.  Psychiatric: He has a normal mood and affect. His behavior is normal. Judgment and thought content normal.  Nursing note and vitals reviewed.   Mallampati Score:  MD Evaluation Airway: WNL Heart: WNL Abdomen: WNL ASA  Classification: 3 Mallampati/Airway Score: Two  Imaging: No results found.  Labs:  CBC:  Recent Labs  04/23/16 1108 04/23/16 1158 07/30/16 0700  WBC 8.1  --  8.1  HGB 12.8* 13.3 12.9*  HCT 39.6 39.0 39.2  PLT 309  --  290    COAGS:  Recent Labs  04/23/16 1108 07/30/16 0700  INR 1.11 0.92  APTT 34 29     BMP:  Recent Labs  04/23/16 1108 04/23/16 1158 07/30/16 0700  NA 138 140 139  K 4.2 4.2 4.1  CL 106 107 106  CO2 24  --  23  GLUCOSE 223* 225* 208*  BUN 16 21* 22*  CALCIUM 9.5  --  9.3  CREATININE 1.02 1.00 0.98  GFRNONAA >60  --  >60  GFRAA >60  --  >60    LIVER  FUNCTION TESTS:  Recent Labs  04/23/16 1108  BILITOT 0.5  AST 19  ALT 16*  ALKPHOS 46  PROT 6.8  ALBUMIN 3.7    TUMOR MARKERS: No results for input(s): AFPTM, CEA, CA199, CHROMGRNA in the last 8760 hours.  Assessment and Plan:  Previous angioplasty / stent basilar artery 01/2016 Hx CVA Doing well Using Eliquis and Plavix daily Scheduled now for cerebral arteriogram Risks and benefits discussed with the patient including, but not limited to bleeding, infection, vascular injury, contrast induced renal failure, stroke or even death. All of the patient's questions were answered, patient is agreeable to proceed. Consent signed and in chart.   Thank you for this interesting consult.  I greatly enjoyed meeting [name] and look forward to participating in their care.  A copy of this report was sent to the requesting provider on this date.  Electronically Signed: Lavonia Drafts, PA-C 03/02/2017, 8:18 AM   I spent a total of    25 Minutes in face to face in clinical consultation, greater than 50% of which was counseling/coordinating care for cerebral arteriogram

## 2017-03-02 NOTE — Progress Notes (Signed)
Pt to RM 15 SStay, RN checked groin, pulses 2t, VS stable. IR team signing off

## 2017-03-02 NOTE — Sedation Documentation (Signed)
IR tech placing exoseal and holding pressure to R groin

## 2017-03-03 ENCOUNTER — Encounter (HOSPITAL_COMMUNITY): Payer: Self-pay | Admitting: Interventional Radiology

## 2017-03-05 ENCOUNTER — Other Ambulatory Visit: Payer: Self-pay | Admitting: Cardiovascular Disease

## 2017-03-05 MED ORDER — FENOFIBRATE 145 MG PO TABS: 145.0000 mg | ORAL_TABLET | Freq: Every day | ORAL | 0 refills | Status: DC

## 2017-03-05 NOTE — Telephone Encounter (Signed)
Rx(s) sent to pharmacy electronically. MD OV 04/02/17

## 2017-03-16 ENCOUNTER — Other Ambulatory Visit: Payer: Self-pay | Admitting: Cardiovascular Disease

## 2017-03-23 ENCOUNTER — Other Ambulatory Visit: Payer: Self-pay | Admitting: *Deleted

## 2017-03-23 MED ORDER — FENOFIBRATE 145 MG PO TABS: 145.0000 mg | ORAL_TABLET | Freq: Every day | ORAL | 0 refills | Status: DC

## 2017-04-02 ENCOUNTER — Ambulatory Visit (INDEPENDENT_AMBULATORY_CARE_PROVIDER_SITE_OTHER): Payer: BLUE CROSS/BLUE SHIELD | Admitting: Cardiovascular Disease

## 2017-04-02 ENCOUNTER — Encounter: Payer: Self-pay | Admitting: Cardiovascular Disease

## 2017-04-02 DIAGNOSIS — I480 Paroxysmal atrial fibrillation: Secondary | ICD-10-CM

## 2017-04-02 DIAGNOSIS — I10 Essential (primary) hypertension: Secondary | ICD-10-CM

## 2017-04-02 DIAGNOSIS — I739 Peripheral vascular disease, unspecified: Secondary | ICD-10-CM | POA: Diagnosis not present

## 2017-04-02 DIAGNOSIS — E781 Pure hyperglyceridemia: Secondary | ICD-10-CM | POA: Diagnosis not present

## 2017-04-02 DIAGNOSIS — I2510 Atherosclerotic heart disease of native coronary artery without angina pectoris: Secondary | ICD-10-CM

## 2017-04-02 NOTE — Addendum Note (Signed)
Addended by: Alvina Filbert B on: 04/02/2017 05:31 PM   Modules accepted: Orders

## 2017-04-02 NOTE — Assessment & Plan Note (Signed)
History of hyperlipidemia on fenofibrate followed by Dr. Arther Dames at [facility].

## 2017-04-02 NOTE — Assessment & Plan Note (Signed)
History of peripheral arterial disease status post multiple procedures by myself of his left lower extremity ultimately requiring stenting with a Viabahn  endoprosthesis. This absolutely closed and he underwent above the below the knee bypass grafting by Dr. Oneida Alar which closed as well. This was reoperated on with a composite vein and PTFE graft, (fem-tib bypass graft which Dr. Oneida Alar follows as an outpatient. His most recent Doppler revealed this to be patent.

## 2017-04-02 NOTE — Patient Instructions (Signed)

## 2017-04-02 NOTE — Assessment & Plan Note (Signed)
History of chronic atrial fibrillation rate controlled on Eliquis oral anticoagulation. ?

## 2017-04-02 NOTE — Assessment & Plan Note (Signed)
History of CAD status post myocardial infarction back in 2002 and subsequent bypass grafting 4 at that time. He had occluded grafts by cath and has had EECP in the past. He had a negative Myoview in 02/07/15 prior to orthopedic surgery. He denies chest pain or shortness of breath.

## 2017-04-02 NOTE — Assessment & Plan Note (Signed)
History of essential hypertension blood pressure measured 146/79. He is on lisinopril, hydrochlorothiazide and metoprolol. Continue current meds at current dosing.

## 2017-04-02 NOTE — Progress Notes (Signed)
04/02/2017 [name]   30-Sep-1955  [id number]  Primary Physician Patient, No Pcp Per Primary Cardiologist: [name] Harp MD [name], [place]  HPI:  [name] is a 61 y.o. male  moderately overweight, widowed Caucasian male, father of 61, grandfather to 3 grandchildren who has a history of ischemic heart disease status post MI back in 2002 with bypass grafting x4 at that time. I last saw him in the office 02/19/16.He has had occluded graft by cath and has had EECP in the past. His other problems include remote tobacco abuse, diabetes with peripheral neuropathy, dyslipidemia and hypertension. He also has peripheral vascular occlusive disease. I stented his left lower extremity several times, the most recent with a Viabahn endoprosthesis with an excellent result. This ultimated closed probably because of his failure to continue his Plavix. He ultimately underwent above-to-below-the-knee bypass grafting by Dr. Ruta [name] which eventually closed as well. Dr. Oneida [name] recently did Dopplers on him revealing a right ABI of 1.03 and a left of 0.66. His last Myoview performed Dec 12, 2011, revealed inferolateral thinning towards the apex. He is otherwise asymptomatic.his lipids are followed by [name], his endocrinologist. He did have a negative Myoview in August of last year prior to orthopedic surgery. He denies chest pain but does complain of increasing dyspnea on exertion. Recent lower extremity Dopplers performed by Dr. Oneida [name] revealed a right ABI of 0.76 and a left of 0.61. He does complain of bilateral lower extremity lifestyle limiting claudication. Recent Dopplers performed 02/05/15 revealed a right ABI 0.79 with a high frequency signal in the right popliteal artery and one-vessel runoff, left ABI of 0.66 with an occluded popliteal artery. A 2-D echo and Myoview stress test were unrevealing. I performed angiography on him 03/28/15 revealing an occluded left SFA with a patent left phlegm to  posterior tibial bypass. He had an 80% stenosis in the P1 segment of the right lower extremity with two-vessel runoff (occluded peroneal). I performed [facility] one directional atherectomy followed by drug eluding balloon angioplasty with excellent angiographic and clinical result. His Dopplers improved and his claudication resolved. He saw Dr. Oneida [name] after my last office visit at that time was noted that his left femoropopliteal bypass graft had occluded by angiography. He underwent redo composite vein/PTFE left fem-tib bypass grafting. He had a stroke back in March and a subsequent stroke last month. He was noted to be in paroxysmal A. fib and was started on Eliquis. He also underwent basilar artery stenting by Dr. Patrecia [name]. He is supposed to start drug rehabilitation. He also complains of increasing dyspnea on exertion since his stroke. A 2-D echo reveals slight decline in his EF down to the 45-50% range. Since I saw him in the office a year ago he's done well. He denies chest pain or shortness of breath. He is followed as an outpatient additionally by Dr. Michiel [name] for his diabetes and hyperlipidemia as well as Dr. Oneida [name] and Dr. Patrecia [name].    Current Meds  Medication Sig  . apixaban (ELIQUIS) 5 MG TABS tablet Take 1 tablet (5 mg total) by mouth 2 (two) times daily.  . clopidogrel (PLAVIX) 75 MG tablet Take 1 tablet (75 mg total) by mouth daily. Keep appointment 09/04 for more refills  . dapagliflozin propanediol (FARXIGA) 10 MG TABS tablet Take 10 mg by mouth daily.  . fenofibrate (TRICOR) 145 MG tablet Take 1 tablet (145 mg total) by mouth daily.  [name] gabapentin (NEURONTIN) 300 MG capsule Take  300-600 mg by mouth 2 (two) times daily. Take 300mg  in the morning and 600mg  in the evening.  . insulin aspart (NOVOLOG) 100 UNIT/ML injection Inject 40 Units into the skin 3 (three) times daily before meals.  [name] LEVEMIR FLEXTOUCH 100 UNIT/ML Pen Inject 80 Units into the skin 2 (two) times daily. Reported on  08/21/2015 (Patient taking differently: Inject 75 Units into the skin 2 (two) times daily. Reported on 08/21/2015)  . lisinopril-hydrochlorothiazide (PRINZIDE,ZESTORETIC) 20-12.5 MG per tablet Take 1 tablet by mouth daily with breakfast.   . metFORMIN (GLUCOPHAGE-XR) 500 MG 24 hr tablet Take 1 tablet (500 mg total) by mouth 2 (two) times daily.  . metoprolol succinate (TOPROL-XL) 25 MG 24 hr tablet Take 1 tablet (25 mg total) by mouth daily.  . naproxen sodium (ALEVE) 220 MG tablet Take 220 mg by mouth daily.  . niacin 500 MG tablet Take 500 mg by mouth daily after supper.     No Known Allergies  Social History   Social History  . Marital status: Widowed    Spouse name: N/A  . Number of children: N/A  . Years of education: N/A   Occupational History  . Not on file.   Social History Main Topics  . Smoking status: Never Smoker  . Smokeless tobacco: Never Used  . Alcohol use No  . Drug use: No  . Sexual activity: Yes   Other Topics Concern  . Not on file   Social History Narrative  . No narrative on file     Review of Systems: General: negative for chills, fever, night sweats or weight changes.  Cardiovascular: negative for chest pain, dyspnea on exertion, edema, orthopnea, palpitations, paroxysmal nocturnal dyspnea or shortness of breath Dermatological: negative for rash Respiratory: negative for cough or wheezing Urologic: negative for hematuria Abdominal: negative for nausea, vomiting, diarrhea, bright red blood per rectum, melena, or hematemesis Neurologic: negative for visual changes, syncope, or dizziness All other systems reviewed and are otherwise negative except as noted above.    Blood pressure (!) 146/79, pulse 63, height 5\' 10"  (1.778 m), weight 255 lb (115.7 kg).  General appearance: alert and no distress Neck: no adenopathy, no carotid bruit, no JVD, supple, symmetrical, trachea midline and thyroid not enlarged, symmetric, no tenderness/mass/nodules Lungs:  clear to auscultation bilaterally Heart: irregularly irregular rhythm Extremities: extremities normal, atraumatic, no cyanosis or edema  EKG atrial fibrillation with a ventricular response of 63, lateral T-wave inversion with septal Q waves. I personally reviewed this EKG.  ASSESSMENT AND PLAN:   Coronary artery disease, history of CABG History of CAD status post myocardial infarction back in 2002 and subsequent bypass grafting 4 at that time. He had occluded grafts by cath and has had EECP in the past. He had a negative Myoview in 02/07/15 prior to orthopedic surgery. He denies chest pain or shortness of breath.  PAD (peripheral artery disease) (HCC) History of peripheral arterial disease status post multiple procedures by myself of his left lower extremity ultimately requiring stenting with a Viabahn  endoprosthesis. This absolutely closed and he underwent above the below the knee bypass grafting by Dr. Oneida [name] which closed as well. This was reoperated on with a composite vein and PTFE graft, (fem-tib bypass graft which Dr. Oneida [name] follows as an outpatient. His most recent Doppler revealed this to be patent.  Essential hypertension History of essential hypertension blood pressure measured 146/79. He is on lisinopril, hydrochlorothiazide and metoprolol. Continue current meds at current dosing.  Hyperlipidemia History of hyperlipidemia  on fenofibrate followed by Dr. Arther Dames at [facility].  Paroxysmal atrial fibrillation (HCC) History of chronic atrial fibrillation rate controlled on Eliquis oral anticoagulation.      [name] Harp MD FACP,FACC,FAHA, [facility] 04/02/2017 3:21 PM

## 2017-04-11 ENCOUNTER — Other Ambulatory Visit: Payer: Self-pay | Admitting: Cardiovascular Disease

## 2017-04-26 ENCOUNTER — Telehealth: Payer: Self-pay | Admitting: Neurology

## 2017-04-26 ENCOUNTER — Encounter: Payer: Self-pay | Admitting: Neurology

## 2017-04-26 ENCOUNTER — Ambulatory Visit (INDEPENDENT_AMBULATORY_CARE_PROVIDER_SITE_OTHER): Payer: BLUE CROSS/BLUE SHIELD | Admitting: Neurology

## 2017-04-26 VITALS — BP 125/66 | HR 65 | Ht 70.0 in | Wt 256.0 lb

## 2017-04-26 DIAGNOSIS — Z9989 Dependence on other enabling machines and devices: Secondary | ICD-10-CM | POA: Diagnosis not present

## 2017-04-26 DIAGNOSIS — G4733 Obstructive sleep apnea (adult) (pediatric): Secondary | ICD-10-CM

## 2017-04-26 DIAGNOSIS — I739 Peripheral vascular disease, unspecified: Secondary | ICD-10-CM | POA: Diagnosis not present

## 2017-04-26 DIAGNOSIS — R0682 Tachypnea, not elsewhere classified: Secondary | ICD-10-CM | POA: Diagnosis not present
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
## 2017-04-26 NOTE — Telephone Encounter (Signed)
[name] tried a ResMed interface so-called air touch at 20, in medium size. This mask feels comfortable to him that I would like to have [employer] replace his current mask when due with this model.

## 2017-04-28 ENCOUNTER — Telehealth: Payer: Self-pay | Admitting: Cardiovascular Disease

## 2017-05-07 NOTE — Progress Notes (Signed)
[facility]  PATIENT: [name] DOB: 08-06-1955   REASON FOR VISIT: Follow-up for brain stem stroke,  Obstructive sleep apnea with CPAP HISTORY FROM: Patient    HISTORY OF PRESENT ILLNESS:UPDATE 05/10/2017 CM  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 He is currently on Plavix and Eliquis for secondary stroke prevention.He has not had further stroke or TIA symptoms. He has minimal bruising and no bleeding. He remains on TriCor and Niacin for hyperlipidemia and elevated triglycerides. He denies myalgias. Recent random glucose 202. He continues to exercise but recently cut back due to a sore on his foot. His diabetes is in better control.  He denies any dizziness vertigo diplopia balance issues.   Or falls.  CPAP  Compliance  data 04/10/2017 through 05/09/2017  She has 97% greater than 4 hours.  Average usage 7 hours 54 minutes. 12 cm of pressure. EPR 3. AHI 6.2 He returns for reevaluation  UPDATE 04/19/2018CM  [name], 61 year old male returns for follow-up with hospital admission in July 2017 for pontine stroke. Patient has multiple vascular risk factors including peripheral vascular disease severe hyperlipidemia diabetes hypertension and mild obesity.He is currently on Plavix and eliquis for secondary stroke prevention and atrial fibrillation. He denies any further stroke or TIA symptoms, minimal bruising and no bleeding. He remains on Lipitor for hyperlipidemia, denies myalgias. He claims his diabetes is in better control but is still not where it needs to be. He still has not obtained his split night sleep study. He continues to have some slurring of speech particularly when fatigued. He has not exercising at present and  claims he has decreased stamina due to his peripheral vascular disease. He returns for reevaluation    HISTORY: 04/01/16 [name] is a 61 year old Caucasian male seen today for first office follow-up visit  following hospital admission for pontine stroke in July 2017. He is accompanied today by his wife.[name] is a 38 y.o. malewith history of stroke in March 2017, CAD status post CABG, diabetes mellitus type 2, hypertriglyceridemia, peripheral vascular disease status post femoro/tibial bypass in February 2017 presents to the ER because of persistent difficulty with expressive aphasia and dizziness. Patient's symptoms started on 01/27/2016 when he woke up in the morning. CT of the head did not show anything acute. MRI of the brain shows left pontine infarct.  . Patient on exam is able to move all extremities without difficulty but still has expressive aphasia. No obvious facial asymmetry. Patient is beyond the window period for TPA. [name] admitted for further evaluation and treatment. On the night after admission he developed worsening neurological status with dysarthria and dysphagia and right facial droop as well as right-sided weakness. Repeat CT scan showed no acute abnormality. 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 Patient had known evidence of posterior circulation disease from previous admission in March 2017. 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 He was seen by Dr. Estanislado [name] and plan was to do elective basilar artery stenting. He returned a week later and underwent stenting by Dr. Estanislado [name] on 02/04/16 which was uneventful. Patient postprocedure developed new-onset atrial fibrillation and was started on eliquis and Plavix was continued for the stent. Patient has multiple vascular risk factors including peripheral vascular disease severe hyperlipidemia diabetes hypertension and mild obesity. He states his done well since discharge. He still has some slurred speech. His finish  home physical occupational therapy but is starting outpatient therapy soon. Still has some occasional slurred speech and is  tired. He can walk independently. His stamina and walking is limited mainly from his peripheral vascular disease. Patient is having only minor bruising but no bleeding. Patient does admit to snoring as well as wife has noticed witnessed apneas. He is not been evaluated for sleep apnea with a polysomnogram yet but is willing to get it done. He started seeing a physician and to help manage his triglycerides and lipids. He has an upcoming appointment to see Dr. Estanislado [name] to follow this basilar stent.    REVIEW OF SYSTEMS: Full 14 system review of systems performed and notable only for those listed, all others are neg:  Constitutional:  Fatigue Cardiovascular: neg Ear/Nose/Throat: neg  Skin: neg Eyes: neg Respiratory:  Shortness of breath Gastroitestinal: neg  Hematology/Lymphatic: neg  Endocrine: neg Musculoskeletal:neg Allergy/Immunology: neg Neurological: history of stroke Psychiatric: neg Sleep :  Obstructive sleep apnea with CPaP ALLERGIES: No Known Allergies  HOME MEDICATIONS: Outpatient Medications Prior to Visit  Medication Sig Dispense Refill  . apixaban (ELIQUIS) 5 MG TABS tablet Take 1 tablet (5 mg total) by mouth 2 (two) times daily. 60 tablet 5  . clopidogrel (PLAVIX) 75 MG tablet Take 1 tablet (75 mg total) by mouth daily. 90 tablet 3  . dapagliflozin propanediol (FARXIGA) 10 MG TABS tablet Take 10 mg by mouth daily.    . fenofibrate (TRICOR) 145 MG tablet Take 1 tablet (145 mg total) by mouth daily. 90 tablet 0  . gabapentin (NEURONTIN) 300 MG capsule Take 300-600 mg by mouth 2 (two) times daily. Take 300mg  in the morning and 600mg  in the evening.    . insulin aspart (NOVOLOG) 100 UNIT/ML injection Inject 40 Units into the skin 3 (three) times daily before meals. 10 mL 11  . LEVEMIR FLEXTOUCH 100 UNIT/ML Pen Inject 80 Units into the skin 2 (two) times daily. Reported on 08/21/2015 (Patient taking differently: Inject 75 Units into the skin 2 (two) times daily. Reported on  08/21/2015) 15 mL 4  . lisinopril-hydrochlorothiazide (PRINZIDE,ZESTORETIC) 20-12.5 MG per tablet Take 1 tablet by mouth daily with breakfast.     . metFORMIN (GLUCOPHAGE-XR) 500 MG 24 hr tablet Take 1 tablet (500 mg total) by mouth 2 (two) times daily.    . metoprolol succinate (TOPROL-XL) 25 MG 24 hr tablet Take 1 tablet (25 mg total) by mouth daily. 90 tablet 3  . naproxen sodium (ALEVE) 220 MG tablet Take 220 mg by mouth daily.    . niacin 500 MG tablet Take 500 mg by mouth daily after supper.     No facility-administered medications prior to visit.     PAST MEDICAL HISTORY: Past Medical History:  Diagnosis Date  . Arthritis    "hx; cleaned it out of both shoulders"  . CAD (coronary artery disease)    OV, Dr Harlow Asa, [facility] 5/12 on chart  EKG 10/12 EPIC,  chest x ray 01/07/11 EPIC  . Carpal tunnel syndrome    peripheral neuropathy  . Chronic shoulder pain    "both"  . DVT (deep venous thrombosis) (HCC)    hx LLE  . History of kidney stones   . Hyperlipidemia   . Hypertension   . Myocardial infarction ([place]) 02/2001  . Neuropathy, peripheral    both feet  . Peripheral vascular disease, unspecified ([place]) 03/2015   PCI to the right popliteal  . Pseudobulbar affect   . Skin cancer    "  have had them cut or burned off my face" (03/28/2015)  . Stroke ([place]) 10/10/2015  . Type II diabetes mellitus ([place])     PAST SURGICAL HISTORY: Past Surgical History:  Procedure Laterality Date  . APPENDECTOMY  1977  . CARDIAC CATHETERIZATION  2002      . CARPAL TUNNEL RELEASE Right 2000's  . CARPAL TUNNEL RELEASE  12/18/2011   Procedure: CARPAL TUNNEL RELEASE;  Surgeon: Mcarthur Rossetti, MD;  Location: [facility];  Service: Orthopedics;  Laterality: Left;  Left Open Carpal Tunnel Release  . CORONARY ANGIOPLASTY    . CORONARY ARTERY BYPASS GRAFT  2002   CABG X 4  . CYSTOSCOPY  several done in past  . FEMORAL-TIBIAL BYPASS GRAFT Left 01/07/11   fem-posterior tibial BPG using reversed left  GSV               12/15/11 OK BY DR Ninfa Linden TO CONTINUE ASA AND PLAVIX  . FEMORAL-TIBIAL BYPASS GRAFT Left 09/06/2015   Procedure: LEFT FEMORAL-POSTERIOR TIBIAL ARTERY BYPASS GRAFT WITH COMPOSITE PTFE AND RIGHT ARM VEIN;  Surgeon: Elam Dutch, MD;  Location: [place];  Service: Vascular;  Laterality: Left;  . FEMOROPOPLITEAL THROMBECTOMY / EMBOLECTOMY  ~ 2010  . FRACTURE SURGERY    . IR ANGIO INTRA EXTRACRAN SEL COM CAROTID INNOMINATE BILAT MOD SED  03/02/2017  . IR ANGIO VERTEBRAL SEL VERTEBRAL UNI L MOD SED  03/02/2017  . IR ANGIOGRAM EXTREMITY RIGHT  03/02/2017  . IR GENERIC HISTORICAL  02/18/2016   IR RADIOLOGIST EVAL & MGMT 02/18/2016 [facility]  . IR GENERIC HISTORICAL  07/30/2016   IR ANGIO VERTEBRAL SEL VERTEBRAL UNI L MOD SED 07/30/2016 Luanne Bras, MD [facility]  . IR GENERIC HISTORICAL  07/30/2016   IR ANGIO INTRA EXTRACRAN SEL COM CAROTID INNOMINATE BILAT MOD SED 07/30/2016 Luanne Bras, MD [facility]  . KNEE ARTHROSCOPY Left X 2  . LAPAROSCOPIC CHOLECYSTECTOMY  1990's  . LITHOTRIPSY  several done in past  . ORIF RADIUS & ULNA FRACTURES Left   . PERIPHERAL VASCULAR CATHETERIZATION N/A 03/28/2015   Procedure: Lower Extremity Angiography;  Surgeon: Lorretta Harp, MD;  Location: [place] [facility];  Service: Cardiovascular;  Laterality: N/A;  . PERIPHERAL VASCULAR CATHETERIZATION Right 03/28/2015   Procedure: Peripheral Vascular Atherectomy;  Surgeon: Lorretta Harp, MD;  Location: [place] [facility];  Service: Cardiovascular;  Laterality: Right;  popliteal;   . PERIPHERAL VASCULAR CATHETERIZATION N/A 08/23/2015   Procedure: Abdominal Aortogram;  Surgeon: Elam Dutch, MD;  Location: [place] [facility];  Service: Cardiovascular;  Laterality: N/A;  . POPLITEAL ARTERY STENT Left 2010-2012 X 4  . RADIOLOGY WITH ANESTHESIA N/A 02/04/2016   Procedure: Basilar artery angioplasty with stenting;  Surgeon: Luanne Bras, MD;  Location: [place];  Service: Radiology;   Laterality: N/A;  . SHOULDER ARTHROSCOPY Left   . SHOULDER ARTHROSCOPY Right 12/18/2011  . SHOULDER ARTHROSCOPY  07/08/2012   Procedure: ARTHROSCOPY SHOULDER;  Surgeon: Mcarthur Rossetti, MD;  Location: [facility];  Service: Orthopedics;  Laterality: Left;  Left Shoulder Arthroscopy with Manipulation and Extensive Debridement  . SHOULDER ARTHROSCOPY WITH ROTATOR CUFF REPAIR Left 07/14/2013   Procedure: LEFT SHOULDER ARTHROSCOPY WITH EXTENSIVE DEBRIDEMENT, DISTAL CLAVICLE REPAIR;  Surgeon: Mcarthur Rossetti, MD;  Location: [facility];  Service: Orthopedics;  Laterality: Left;  . SKIN CANCER EXCISION     "left side of my forehead"  . VEIN HARVEST Right 09/06/2015   Procedure: RIGHT ARM VEIN HARVEST;  Surgeon: Jessy Oto  Fields, MD;  Location: [place];  Service: Vascular;  Laterality: Right;    FAMILY HISTORY: Family History  Problem Relation Age of Onset  . Cancer Mother        Breast and Brain tumor  . Cancer Father        Blood vessel tumor    SOCIAL HISTORY: Social History   Social History  . Marital status: Widowed    Spouse name: N/A  . Number of children: N/A  . Years of education: N/A   Occupational History  . Not on file.   Social History Main Topics  . Smoking status: Never Smoker  . Smokeless tobacco: Never Used  . Alcohol use No  . Drug use: No  . Sexual activity: Yes   Other Topics Concern  . Not on file   Social History Narrative  . No narrative on file     PHYSICAL EXAM  Vitals:   05/10/17 0937  BP: (!) 152/79  Pulse: 81  Weight: 255 lb 3.2 oz (115.8 kg)  Height: 5\' 10"  (1.778 m)   Body mass index is 36.62 kg/m.  Generalized: Well developed, Obese male in no acute distress  Head: normocephalic and atraumatic,. Oropharynx benign  Neck: Supple, no carotid bruits  Cardiac: Irregular rate rhythm, no murmur  Musculoskeletal: No deformity   Neurological examination   Mentation: Alert oriented to time, place, history taking. Attention span and  concentration appropriate. Recent and remote memory intact.  Follows all commands speech  with occasional slurred words and language fluent.   Cranial nerve II-XII: Pupils were equal round reactive to light extraocular movements were full, visual field were full on confrontational test. Facial sensation and strength were normal. hearing was intact to finger rubbing bilaterally. Uvula tongue midline. head turning and shoulder shrug were normal and symmetric.Tongue protrusion into cheek strength was normal. Motor: normal bulk and tone, full strength in the BUE, BLE, fine finger movements normal, no pronator drift. No focal weakness Sensory: normal and symmetric to light touch,  on the face arms and legs Coordination: finger-nose-finger, heel-to-shin bilaterally, no dysmetria Reflexes:  1+ upper lower and symmetric , plantar responses were flexor bilaterally. Gait and Station: Rising up from seated position without assistance,  wide based stance,  moderate stride, good arm swing, smooth turning, able to perform tiptoe, and heel walking .  Tandem gait is steady. DIAGNOSTIC DATA (LABS, IMAGING, TESTING) - I reviewed patient records, labs, notes, testing and imaging myself where available.  Lab Results  Component Value Date   WBC 8.1 03/02/2017   HGB 13.2 03/02/2017   HCT 40.4 03/02/2017   MCV 86.1 03/02/2017   PLT 249 03/02/2017      Component Value Date/Time   NA 138 03/02/2017 0811   K 4.4 03/02/2017 0811   CL 106 03/02/2017 0811   CO2 24 03/02/2017 0811   GLUCOSE 271 (H) 03/02/2017 0811   BUN 23 (H) 03/02/2017 0811   CREATININE 1.23 03/02/2017 0811   CREATININE 0.93 03/22/2015 0001   CALCIUM 9.5 03/02/2017 0811   PROT 6.8 04/23/2016 1108   ALBUMIN 3.7 04/23/2016 1108   AST 19 04/23/2016 1108   ALT 16 (L) 04/23/2016 1108   ALKPHOS 46 04/23/2016 1108   BILITOT 0.5 04/23/2016 1108   GFRNONAA >60 03/02/2017 0811   GFRAA >60 03/02/2017 0811   Lab Results  Component Value Date   CHOL  523 (H) 01/29/2016   HDL NOT REPORTED DUE TO HIGH TRIGLYCERIDES 01/29/2016   LDLCALC UNABLE TO  CALCULATE IF TRIGLYCERIDE OVER 400 mg/dL 01/29/2016   LDLDIRECT 87 01/16/2015   TRIG 1,925 (H) 01/29/2016   CHOLHDL NOT REPORTED DUE TO HIGH TRIGLYCERIDES 01/29/2016   Lab Results  Component Value Date   HGBA1C 11.5 (H) 01/29/2016    Lab Results  Component Value Date   TSH 2.497 01/29/2016      ASSESSMENT AND PLAN 81 year Caucasian male with recurrent pontine infarct secondary to symptomatic basilar stenosis in July 2017 status post elective angioplasty stenting,  atrial fibrillation. Multiple vascular risk factors of obesity, diabetes, hypertension, hyperlipidemia, peripheral vascular disease and intracranial stenosis.  Now with obstructive sleep apnea on CPAP,Compliance  data 04/10/2017 through 05/09/2017  She has 97% greater than 4 hours.  Average usage 7 hours 54 minutes. 12 cm of pressure. EPR 3. AHI 6.2   PLAN Stressed the importance of management of risk factors to prevent further stroke Continue Plavix and Eliquis for secondary stroke prevention and atrial fib Maintain strict control of hypertension with blood pressure goal below 130/90continue antihypertensive medications Control of diabetes with hemoglobin A1c below 6.5 followed by primary care most recent random glucose 202  continue diabetic medications Cholesterol with LDL cholesterol less than 70, followed by cardiology continue  statin drugs and niacin Exercise by walking, 30 min daily eat healthy diet with whole grains,  fresh fruits and vegetables Continue CPAP at current settings F/U in 6 months for repeat CPAP compliance Will dismiss from stroke clinic I spent  25 minutes in total face to face time with the patient more than 50% of which was spent counseling and coordination of care, reviewing test results reviewing medications and discussing and reviewing the diagnosis of  Stroke , management of risk factors,  obstructive  sleep apnea with compliance report review Dennie Bible, [facility], [facility], APRN  [facility] [address], [place], [place] [address] [phone]  I reviewed the above note and documentation by the Nurse Practitioner and agree with the history, physical exam, assessment and plan as outlined above. I was immediately available for face-to-face consultation. Star Age, MD, PhD [facility])

## 2017-05-09 ENCOUNTER — Encounter: Payer: Self-pay | Admitting: Nurse Practitioner

## 2017-05-10 ENCOUNTER — Encounter: Payer: Self-pay | Admitting: Nurse Practitioner

## 2017-05-10 ENCOUNTER — Ambulatory Visit (INDEPENDENT_AMBULATORY_CARE_PROVIDER_SITE_OTHER): Payer: BLUE CROSS/BLUE SHIELD | Admitting: Nurse Practitioner

## 2017-05-10 VITALS — BP 152/79 | HR 81 | Ht 70.0 in | Wt 255.2 lb

## 2017-05-10 DIAGNOSIS — I480 Paroxysmal atrial fibrillation: Secondary | ICD-10-CM

## 2017-05-10 DIAGNOSIS — I10 Essential (primary) hypertension: Secondary | ICD-10-CM | POA: Diagnosis not present

## 2017-05-10 DIAGNOSIS — G4733 Obstructive sleep apnea (adult) (pediatric): Secondary | ICD-10-CM | POA: Insufficient documentation

## 2017-05-10 DIAGNOSIS — E781 Pure hyperglyceridemia: Secondary | ICD-10-CM

## 2017-05-10 DIAGNOSIS — Z8673 Personal history of transient ischemic attack (TIA), and cerebral infarction without residual deficits: Secondary | ICD-10-CM | POA: Diagnosis not present

## 2017-05-10 DIAGNOSIS — Z9989 Dependence on other enabling machines and devices: Secondary | ICD-10-CM

## 2017-05-10 NOTE — Patient Instructions (Addendum)
Stressed the importance of management of risk factors to prevent further stroke Continue Plavix and Eliquis for secondary stroke prevention and atrial fib Maintain strict control of hypertension with blood pressure goal below 130/90continue antihypertensive medications Control of diabetes with hemoglobin A1c below 6.5 followed by primary care most recent random glucose 202  continue diabetic medications Cholesterol with LDL cholesterol less than 70, followed by cardiology continue  statin drugs and niacin Exercise by walking, 30 min daily eat healthy diet with whole grains,  fresh fruits and vegetables Continue CPAP at current settings F/U in 6 months for repeat CPAP compliance Will dismiss from stroke clinic Stroke Prevention Some medical conditions and behaviors are associated with an increased chance of having a stroke. You may prevent a stroke by making healthy choices and managing medical conditions. How can I reduce my risk of having a stroke?  Stay physically active. Get at least 30 minutes of activity on most or all days.  Do not smoke. It may also be helpful to avoid exposure to secondhand smoke.  Limit alcohol use. Moderate alcohol use is considered to be: ? No more than 2 drinks per day for men. ? No more than 1 drink per day for nonpregnant women.  Eat healthy foods. This involves: ? Eating 5 or more servings of fruits and vegetables a day. ? Making dietary changes that address high blood pressure (hypertension), high cholesterol, diabetes, or obesity.  Manage your cholesterol levels. ? Making food choices that are high in fiber and low in saturated fat, trans fat, and cholesterol may control cholesterol levels. ? Take any prescribed medicines to control cholesterol as directed by your health care provider.  Manage your diabetes. ? Controlling your carbohydrate and sugar intake is recommended to manage diabetes. ? Take any prescribed medicines to control diabetes as  directed by your health care provider.  Control your hypertension. ? Making food choices that are low in salt (sodium), saturated fat, trans fat, and cholesterol is recommended to manage hypertension. ? Ask your health care provider if you need treatment to lower your blood pressure. Take any prescribed medicines to control hypertension as directed by your health care provider. ? If you are 30-61 years of age, have your blood pressure checked every 3-5 years. If you are 64 years of age or older, have your blood pressure checked every year.  Maintain a healthy weight. ? Reducing calorie intake and making food choices that are low in sodium, saturated fat, trans fat, and cholesterol are recommended to manage weight.  Stop drug abuse.  Avoid taking birth control pills. ? Talk to your health care provider about the risks of taking birth control pills if you are over 73 years old, smoke, get migraines, or have ever had a blood clot.  Get evaluated for sleep disorders (sleep apnea). ? Talk to your health care provider about getting a sleep evaluation if you snore a lot or have excessive sleepiness.  Take medicines only as directed by your health care provider. ? For some people, aspirin or blood thinners (anticoagulants) are helpful in reducing the risk of forming abnormal blood clots that can lead to stroke. If you have the irregular heart rhythm of atrial fibrillation, you should be on a blood thinner unless there is a good reason you cannot take them. ? Understand all your medicine instructions.  Make sure that other conditions (such as anemia or atherosclerosis) are addressed. Get help right away if:  You have sudden weakness or numbness of the  face, arm, or leg, especially on one side of the body.  Your face or eyelid droops to one side.  You have sudden confusion.  You have trouble speaking (aphasia) or understanding.  You have sudden trouble seeing in one or both eyes.  You have  sudden trouble walking.  You have dizziness.  You have a loss of balance or coordination.  You have a sudden, severe headache with no known cause.  You have new chest pain or an irregular heartbeat. Any of these symptoms may represent a serious problem that is an emergency. Do not wait to see if the symptoms will go away. Get medical help at once. Call your local emergency services (911 in [place] Do not drive yourself to the hospital. This information is not intended to replace advice given to you by your health care provider. Make sure you discuss any questions you have with your health care provider. Document Released: 08/13/2004 Document Revised: 12/12/2015 Document Reviewed: 01/06/2013 Elsevier Interactive Patient Education  2017 [employer].

## 2017-05-10 NOTE — Progress Notes (Signed)
I agree with the above plan 

## 2017-05-11 ENCOUNTER — Other Ambulatory Visit: Payer: Self-pay | Admitting: Cardiovascular Disease

## 2017-05-11 NOTE — Telephone Encounter (Signed)
REFILL 

## 2017-05-13 NOTE — Telephone Encounter (Signed)
error 

## 2017-05-22 ENCOUNTER — Other Ambulatory Visit: Payer: Self-pay | Admitting: Cardiovascular Disease

## 2017-09-01 ENCOUNTER — Telehealth (HOSPITAL_COMMUNITY): Payer: Self-pay

## 2017-09-01 NOTE — Telephone Encounter (Signed)
Called to schedule a f/u mri, left message for pt to return call. AW

## 2017-09-02 ENCOUNTER — Other Ambulatory Visit (HOSPITAL_COMMUNITY): Payer: Self-pay | Admitting: Interventional Radiology

## 2017-09-02 ENCOUNTER — Encounter: Payer: Self-pay | Admitting: Cardiology

## 2017-09-02 DIAGNOSIS — I771 Stricture of artery: Secondary | ICD-10-CM

## 2017-09-15 ENCOUNTER — Ambulatory Visit (HOSPITAL_COMMUNITY)
Admission: RE | Admit: 2017-09-15 | Discharge: 2017-09-15 | Disposition: A | Discharge status: Home / Self Care | Payer: BLUE CROSS/BLUE SHIELD | Source: Ambulatory Visit | Attending: Interventional Radiology | Admitting: Interventional Radiology

## 2017-09-15 ENCOUNTER — Ambulatory Visit (HOSPITAL_COMMUNITY): Payer: BLUE CROSS/BLUE SHIELD

## 2017-09-15 DIAGNOSIS — I6501 Occlusion and stenosis of right vertebral artery: Secondary | ICD-10-CM | POA: Diagnosis not present

## 2017-09-15 DIAGNOSIS — Z95820 Peripheral vascular angioplasty status with implants and grafts: Secondary | ICD-10-CM | POA: Insufficient documentation

## 2017-09-15 DIAGNOSIS — I771 Stricture of artery: Secondary | ICD-10-CM

## 2017-09-15 DIAGNOSIS — I6621 Occlusion and stenosis of right posterior cerebral artery: Secondary | ICD-10-CM | POA: Diagnosis not present

## 2017-09-15 DIAGNOSIS — I639 Cerebral infarction, unspecified: Secondary | ICD-10-CM | POA: Insufficient documentation

## 2017-09-15 LAB — CREATININE, SERUM
CREATININE: 1.16 mg/dL (ref 0.61–1.24)
GFR calc non Af Amer: >60 mL/min (ref >60)

## 2017-09-15 MED ORDER — GADOBENATE DIMEGLUMINE 529 MG/ML IV SOLN
20.0000 mL | Freq: Once | INTRAVENOUS | Status: AC | PRN
  Administered 2017-09-15: 20 mL via INTRAVENOUS

## 2017-09-15 NOTE — Telephone Encounter (Signed)
Error

## 2017-10-06 ENCOUNTER — Telehealth (HOSPITAL_COMMUNITY): Payer: Self-pay

## 2017-10-06 NOTE — Telephone Encounter (Signed)
Pt agreed to f/u in 6 months with an mri/mra per Dr. Estanislado Pandy. AW

## 2017-11-05 NOTE — Progress Notes (Signed)
[facility]  PATIENT: [name] DOB: 07-Jan-1956   REASON FOR VISIT: Follow-up for obstructive sleep apnea with CPAP, history of stroke HISTORY FROM: Patient    HISTORY OF PRESENT ILLNESS:UPDATE 4/22/2019CM [name], 62 year old male returns for follow-up with a history of obstructive sleep apnea here for CPAP compliance.  He also has a history of stroke in July 2017 without recurrent stroke or TIA symptoms.  CPAP compliance data dated 10/09/2017- 11/07/2017 shows compliance greater than 4 hours at 93%.  Average usage 7 hours 43 minutes.  Set pressure of 12 cm AHI 3.1 EPR level 3.  He returns for reevaluation UPDATE 05/10/2017 CM  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 He is currently on Plavix and Eliquis for secondary stroke prevention.He has not had further stroke or TIA symptoms. He has minimal bruising and no bleeding. He remains on TriCor and Niacin for hyperlipidemia and elevated triglycerides. He denies myalgias. Recent random glucose 202. He continues to exercise but recently cut back due to a sore on his foot. His diabetes is in better control.  He denies any dizziness vertigo diplopia balance issues.   Or falls.  CPAP  Compliance  data 04/10/2017 through 05/09/2017  She has 97% greater than 4 hours.  Average usage 7 hours 54 minutes. 12 cm of pressure. EPR 3. AHI 6.2 He returns for reevaluation  UPDATE 04/19/2018CM  [name], 62 year old male returns for follow-up with hospital admission in July 2017 for pontine stroke. Patient has multiple vascular risk factors including peripheral vascular disease severe hyperlipidemia diabetes hypertension and mild obesity.He is currently on Plavix and eliquis for secondary stroke prevention and atrial fibrillation. He denies any further stroke or TIA symptoms, minimal bruising and no bleeding. He remains on Lipitor for hyperlipidemia, denies myalgias. He claims his diabetes is in  better control but is still not where it needs to be. He still has not obtained his split night sleep study. He continues to have some slurring of speech particularly when fatigued. He has not exercising at present and  claims he has decreased stamina due to his peripheral vascular disease. He returns for reevaluation    HISTORY: 04/01/16 PSMr [name] is a 62 year old Caucasian male seen today for first office follow-up visit following hospital admission for pontine stroke in July 2017. He is accompanied today by his wife.[name] is a 73 y.o. malewith history of stroke in March 2017, CAD status post CABG, diabetes mellitus type 2, hypertriglyceridemia, peripheral vascular disease status post femoro/tibial bypass in February 2017 presents to the ER because of persistent difficulty with expressive aphasia and dizziness. Patient's symptoms started on 01/27/2016 when he woke up in the morning. CT of the head did not show anything acute. MRI of the brain shows left pontine infarct.  . Patient on exam is able to move all extremities without difficulty but still has expressive aphasia. No obvious facial asymmetry. Patient is beyond the window period for TPA. [name] admitted for further evaluation and treatment. On the night after admission he developed worsening neurological status with dysarthria and dysphagia and right facial droop as well as right-sided weakness. Repeat CT scan showed no acute abnormality. 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 Patient had known evidence of posterior circulation disease from previous admission in March 2017. 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 He was seen by Dr. Estanislado Pandy and  plan was to do elective basilar artery stenting. He returned a week later and underwent stenting by Dr. Estanislado Pandy on 02/04/16 which was uneventful. Patient postprocedure developed new-onset atrial  fibrillation and was started on eliquis and Plavix was continued for the stent. Patient has multiple vascular risk factors including peripheral vascular disease severe hyperlipidemia diabetes hypertension and mild obesity. He states his done well since discharge. He still has some slurred speech. His finish home physical occupational therapy but is starting outpatient therapy soon. Still has some occasional slurred speech and is tired. He can walk independently. His stamina and walking is limited mainly from his peripheral vascular disease. Patient is having only minor bruising but no bleeding. Patient does admit to snoring as well as wife has noticed witnessed apneas. He is not been evaluated for sleep apnea with a polysomnogram yet but is willing to get it done. He started seeing a physician and to help manage his triglycerides and lipids. He has an upcoming appointment to see Dr. Estanislado Pandy to follow this basilar stent.    REVIEW OF SYSTEMS: Full 14 system review of systems performed and notable only for those listed, all others are neg:  Constitutional:  neg Cardiovascular: neg Ear/Nose/Throat: neg  Skin: neg Eyes: neg Respiratory:  neg Gastroitestinal: neg  Hematology/Lymphatic: neg  Endocrine: neg Musculoskeletal:neg Allergy/Immunology: neg Neurological: history of stroke Psychiatric: neg Sleep :  Obstructive sleep apnea with CPaP ALLERGIES: No Known Allergies  HOME MEDICATIONS: Outpatient Medications Prior to Visit  Medication Sig Dispense Refill  . clopidogrel (PLAVIX) 75 MG tablet Take 1 tablet (75 mg total) by mouth daily. 90 tablet 3  . ELIQUIS 5 MG TABS tablet TAKE 1 TABLET BY MOUTH TWICE A DAY 180 tablet 1  . fenofibrate (TRICOR) 145 MG tablet Take 1 tablet (145 mg total) by mouth daily. 90 tablet 0  . gabapentin (NEURONTIN) 300 MG capsule Take 300-600 mg by mouth 2 (two) times daily. Take 300mg  in the morning and 600mg  in the evening.    . insulin aspart (NOVOLOG) 100  UNIT/ML injection Inject 40 Units into the skin 3 (three) times daily before meals. 10 mL 11  . LEVEMIR FLEXTOUCH 100 UNIT/ML Pen Inject 80 Units into the skin 2 (two) times daily. Reported on 08/21/2015 (Patient taking differently: Inject 75 Units into the skin 2 (two) times daily. Reported on 08/21/2015) 15 mL 4  . lisinopril-hydrochlorothiazide (PRINZIDE,ZESTORETIC) 20-12.5 MG per tablet Take 1 tablet by mouth daily with breakfast.     . metFORMIN (GLUCOPHAGE-XR) 500 MG 24 hr tablet Take 1 tablet (500 mg total) by mouth 2 (two) times daily.    . metoprolol succinate (TOPROL-XL) 25 MG 24 hr tablet TAKE 1 TABLET (25 MG TOTAL) BY MOUTH DAILY. *DUE 9/17* 90 tablet 3  . naproxen sodium (ALEVE) 220 MG tablet Take 220 mg by mouth daily.    . niacin 500 MG tablet Take 500 mg by mouth daily after supper.    [name] apixaban (ELIQUIS) 5 MG TABS tablet Take 1 tablet (5 mg total) by mouth 2 (two) times daily. 60 tablet 5   No facility-administered medications prior to visit.     PAST MEDICAL HISTORY: Past Medical History:  Diagnosis Date  . Arthritis    "hx; cleaned it out of both shoulders"  . CAD (coronary artery disease)    OV, Dr Harlow Asa, [facility] 5/12 on chart  EKG 10/12 EPIC,  chest x ray 01/07/11 EPIC  . Carpal tunnel syndrome    peripheral neuropathy  . Chronic  shoulder pain    "both"  . DVT (deep venous thrombosis) (HCC)    hx LLE  . History of kidney stones   . Hyperlipidemia   . Hypertension   . Myocardial infarction ([place]) 02/2001  . Neuropathy, peripheral    both feet  . Peripheral vascular disease, unspecified ([place]) 03/2015   PCI to the right popliteal  . Pseudobulbar affect   . Skin cancer    "have had them cut or burned off my face" (03/28/2015)  . Stroke ([place]) 10/10/2015  . Type II diabetes mellitus ([place])     PAST SURGICAL HISTORY: Past Surgical History:  Procedure Laterality Date  . APPENDECTOMY  1977  . CARDIAC CATHETERIZATION  2002      . CARPAL TUNNEL RELEASE Right 2000's   . CARPAL TUNNEL RELEASE  12/18/2011   Procedure: CARPAL TUNNEL RELEASE;  Surgeon: Mcarthur Rossetti, MD;  Location: [facility];  Service: Orthopedics;  Laterality: Left;  Left Open Carpal Tunnel Release  . CORONARY ANGIOPLASTY    . CORONARY ARTERY BYPASS GRAFT  2002   CABG X 4  . CYSTOSCOPY  several done in past  . FEMORAL-TIBIAL BYPASS GRAFT Left 01/07/11   fem-posterior tibial BPG using reversed left GSV               12/15/11 OK BY DR Ninfa Linden TO CONTINUE ASA AND PLAVIX  . FEMORAL-TIBIAL BYPASS GRAFT Left 09/06/2015   Procedure: LEFT FEMORAL-POSTERIOR TIBIAL ARTERY BYPASS GRAFT WITH COMPOSITE PTFE AND RIGHT ARM VEIN;  Surgeon: Elam Dutch, MD;  Location: [facility];  Service: Vascular;  Laterality: Left;  . FEMOROPOPLITEAL THROMBECTOMY / EMBOLECTOMY  ~ 2010  . FRACTURE SURGERY    . IR ANGIO INTRA EXTRACRAN SEL COM CAROTID INNOMINATE BILAT MOD SED  03/02/2017  . IR ANGIO VERTEBRAL SEL VERTEBRAL UNI L MOD SED  03/02/2017  . IR ANGIOGRAM EXTREMITY RIGHT  03/02/2017  . IR GENERIC HISTORICAL  02/18/2016   IR RADIOLOGIST EVAL & MGMT 02/18/2016 [facility]  . IR GENERIC HISTORICAL  07/30/2016   IR ANGIO VERTEBRAL SEL VERTEBRAL UNI L MOD SED 07/30/2016 Luanne Bras, MD [facility]  . IR GENERIC HISTORICAL  07/30/2016   IR ANGIO INTRA EXTRACRAN SEL COM CAROTID INNOMINATE BILAT MOD SED 07/30/2016 Luanne Bras, MD [facility]  . KNEE ARTHROSCOPY Left X 2  . LAPAROSCOPIC CHOLECYSTECTOMY  1990's  . LITHOTRIPSY  several done in past  . ORIF RADIUS & ULNA FRACTURES Left   . PERIPHERAL VASCULAR CATHETERIZATION N/A 03/28/2015   Procedure: Lower Extremity Angiography;  Surgeon: Lorretta Harp, MD;  Location: [facility];  Service: Cardiovascular;  Laterality: N/A;  . PERIPHERAL VASCULAR CATHETERIZATION Right 03/28/2015   Procedure: Peripheral Vascular Atherectomy;  Surgeon: Lorretta Harp, MD;  Location: [facility];  Service: Cardiovascular;  Laterality: Right;  popliteal;   .  PERIPHERAL VASCULAR CATHETERIZATION N/A 08/23/2015   Procedure: Abdominal Aortogram;  Surgeon: Elam Dutch, MD;  Location: [facility];  Service: Cardiovascular;  Laterality: N/A;  . POPLITEAL ARTERY STENT Left 2010-2012 X 4  . RADIOLOGY WITH ANESTHESIA N/A 02/04/2016   Procedure: Basilar artery angioplasty with stenting;  Surgeon: Luanne Bras, MD;  Location: [facility];  Service: Radiology;  Laterality: N/A;  . SHOULDER ARTHROSCOPY Left   . SHOULDER ARTHROSCOPY Right 12/18/2011  . SHOULDER ARTHROSCOPY  07/08/2012   Procedure: ARTHROSCOPY SHOULDER;  Surgeon: Mcarthur Rossetti, MD;  Location: [facility];  Service: Orthopedics;  Laterality: Left;  Left Shoulder  Arthroscopy with Manipulation and Extensive Debridement  . SHOULDER ARTHROSCOPY WITH ROTATOR CUFF REPAIR Left 07/14/2013   Procedure: LEFT SHOULDER ARTHROSCOPY WITH EXTENSIVE DEBRIDEMENT, DISTAL CLAVICLE REPAIR;  Surgeon: Mcarthur Rossetti, MD;  Location: [facility];  Service: Orthopedics;  Laterality: Left;  . SKIN CANCER EXCISION     "left side of my forehead"  . VEIN HARVEST Right 09/06/2015   Procedure: RIGHT ARM VEIN HARVEST;  Surgeon: Elam Dutch, MD;  Location: [facility];  Service: Vascular;  Laterality: Right;    FAMILY HISTORY: Family History  Problem Relation Age of Onset  . Cancer Mother        Breast and Brain tumor  . Cancer Father        Blood vessel tumor    SOCIAL HISTORY: Social History   Socioeconomic History  . Marital status: Widowed    Spouse name: Not on file  . Number of children: Not on file  . Years of education: Not on file  . Highest education level: Not on file  Occupational History  . Not on file  Social Needs  . Financial resource strain: Not on file  . Food insecurity:    Worry: Not on file    Inability: Not on file  . Transportation needs:    Medical: Not on file    Non-medical: Not on file  Tobacco Use  . Smoking status: Never Smoker  . Smokeless tobacco: Never Used    Substance and Sexual Activity  . Alcohol use: No  . Drug use: No  . Sexual activity: Yes  Lifestyle  . Physical activity:    Days per week: Not on file    Minutes per session: Not on file  . Stress: Not on file  Relationships  . Social connections:    Talks on phone: Not on file    Gets together: Not on file    Attends religious service: Not on file    Active member of club or organization: Not on file    Attends meetings of clubs or organizations: Not on file    Relationship status: Not on file  . Intimate partner violence:    Fear of current or ex partner: Not on file    Emotionally abused: Not on file    Physically abused: Not on file    Forced sexual activity: Not on file  Other Topics Concern  . Not on file  Social History Narrative  . Not on file     PHYSICAL EXAM  Vitals:   11/08/17 1240  BP: 128/71  Pulse: 71  Weight: 251 lb 6.4 oz (114 kg)  Height: 5\' 10"  (1.778 m)   Body mass index is 36.07 kg/m.  Generalized: Well developed, Obese male in no acute distress  Head: normocephalic and atraumatic,. Oropharynx benign  Neck: Supple, no carotid bruits  Cardiac: Irregular rate rhythm,  Musculoskeletal: No deformity   Neurological examination   Mentation: Alert oriented to time, place, history taking. Attention span and concentration appropriate. Recent and remote memory intact.  Follows all commands speech is and language fluent.   Cranial nerve II-XII: Pupils were equal round reactive to light extraocular movements were full, visual field were full on confrontational test. Facial sensation and strength were normal. hearing was intact to finger rubbing bilaterally. Uvula tongue midline. head turning and shoulder shrug were normal and symmetric.Tongue protrusion into cheek strength was normal. Motor: normal bulk and tone, full strength in the BUE, BLE, fine finger movements normal, no pronator drift.  No focal weakness Sensory: normal and symmetric to light  touch,  on the face arms and legs Coordination: finger-nose-finger, heel-to-shin bilaterally, no dysmetria Gait and Station: Rising up from seated position without assistance,  wide based stance,  moderate stride, good arm swing, smooth turning, able to perform tiptoe, and heel walking .  Tandem gait is steady. DIAGNOSTIC DATA (LABS, IMAGING, TESTING) - I reviewed patient records, labs, notes, testing and imaging myself where available.  Lab Results  Component Value Date   WBC 8.1 03/02/2017   HGB 13.2 03/02/2017   HCT 40.4 03/02/2017   MCV 86.1 03/02/2017   PLT 249 03/02/2017      Component Value Date/Time   NA 138 03/02/2017 0811   K 4.4 03/02/2017 0811   CL 106 03/02/2017 0811   CO2 24 03/02/2017 0811   GLUCOSE 271 (H) 03/02/2017 0811   BUN 23 (H) 03/02/2017 0811   CREATININE 1.16 09/15/2017 1148   CREATININE 0.93 03/22/2015 0001   CALCIUM 9.5 03/02/2017 0811   PROT 6.8 04/23/2016 1108   ALBUMIN 3.7 04/23/2016 1108   AST 19 04/23/2016 1108   ALT 16 (L) 04/23/2016 1108   ALKPHOS 46 04/23/2016 1108   BILITOT 0.5 04/23/2016 1108   GFRNONAA >60 09/15/2017 1148   GFRAA >60 09/15/2017 1148   Lab Results  Component Value Date   CHOL 523 (H) 01/29/2016   HDL NOT REPORTED DUE TO HIGH TRIGLYCERIDES 01/29/2016   LDLCALC UNABLE TO CALCULATE IF TRIGLYCERIDE OVER 400 mg/dL 01/29/2016   LDLDIRECT 87 01/16/2015   TRIG 1,925 (H) 01/29/2016   CHOLHDL NOT REPORTED DUE TO HIGH TRIGLYCERIDES 01/29/2016   Lab Results  Component Value Date   HGBA1C 11.5 (H) 01/29/2016    Lab Results  Component Value Date   TSH 2.497 01/29/2016      ASSESSMENT AND PLAN 32 year Caucasian male with recurrent pontine infarct secondary to symptomatic basilar stenosis in July 2017 status post elective angioplasty stenting,  atrial fibrillation. Multiple vascular risk factors of obesity, diabetes, hypertension, hyperlipidemia, peripheral vascular disease and intracranial stenosis.  Now with obstructive  sleep apnea on CPAP,compliance data dated 10/09/2017- 11/07/2017 shows compliance greater than 4 hours at 93%.  Average usage 7 hours 43 minutes.  Set pressure of 12 cm AHI 3.1 EPR level 3.    PLAN CPAP compliance 93% Continue same settings Follow-up yearly for compliance Dennie Bible, [facility], [facility], APRN  [facility] [address] [place], [place] [address] [phone]

## 2017-11-07 ENCOUNTER — Encounter: Payer: Self-pay | Admitting: Nurse Practitioner

## 2017-11-08 ENCOUNTER — Ambulatory Visit: Payer: BLUE CROSS/BLUE SHIELD | Admitting: Nurse Practitioner

## 2017-11-08 ENCOUNTER — Encounter: Payer: Self-pay | Admitting: Nurse Practitioner

## 2017-11-08 VITALS — BP 128/71 | HR 71 | Ht 70.0 in | Wt 251.4 lb

## 2017-11-08 DIAGNOSIS — Z9989 Dependence on other enabling machines and devices: Secondary | ICD-10-CM | POA: Diagnosis not present

## 2017-11-08 DIAGNOSIS — G4733 Obstructive sleep apnea (adult) (pediatric): Secondary | ICD-10-CM | POA: Diagnosis not present

## 2017-11-08 NOTE — Patient Instructions (Signed)
CPAP compliance 93% Continue same settings Follow-up yearly for compliance

## 2017-12-23 ENCOUNTER — Inpatient Hospital Stay (HOSPITAL_COMMUNITY): Admission: RE | Admit: 2017-12-23 | Payer: BLUE CROSS/BLUE SHIELD | Source: Ambulatory Visit

## 2017-12-23 ENCOUNTER — Ambulatory Visit: Payer: BLUE CROSS/BLUE SHIELD | Admitting: Family

## 2018-02-11 ENCOUNTER — Other Ambulatory Visit (HOSPITAL_COMMUNITY): Payer: BLUE CROSS/BLUE SHIELD

## 2018-02-11 ENCOUNTER — Ambulatory Visit: Payer: BLUE CROSS/BLUE SHIELD | Admitting: Family

## 2018-02-11 ENCOUNTER — Encounter (HOSPITAL_COMMUNITY): Payer: BLUE CROSS/BLUE SHIELD

## 2018-02-19 ENCOUNTER — Other Ambulatory Visit: Payer: Self-pay | Admitting: Cardiovascular Disease

## 2018-02-21 ENCOUNTER — Telehealth: Payer: Self-pay | Admitting: Cardiovascular Disease

## 2018-02-21 NOTE — Telephone Encounter (Signed)
New Message   Patient is calling to request a discount card for Eliquis. Please call.

## 2018-02-21 NOTE — Telephone Encounter (Signed)
Spoke to patient.  Patient states he started medicare this month and eliquis for 3 months will be $1400.  RN  Informed patient that he will be unable to use saving card due to being on medicare now. patient has 2 weeks supply. Patient states he has issues with memory since he had a stroke. Patient states he can not afford medication if it going to cost that much. RN INFORMED PATIENT WILL TRY contact insurance for an alternate medication and contact patient back -- patient states he has atena -ppo

## 2018-03-10 ENCOUNTER — Ambulatory Visit: Payer: BLUE CROSS/BLUE SHIELD | Admitting: Family

## 2018-03-10 ENCOUNTER — Encounter (HOSPITAL_COMMUNITY): Payer: BLUE CROSS/BLUE SHIELD

## 2018-03-10 ENCOUNTER — Ambulatory Visit (INDEPENDENT_AMBULATORY_CARE_PROVIDER_SITE_OTHER)
Admission: RE | Admit: 2018-03-10 | Discharge: 2018-03-10 | Disposition: A | Discharge status: Home / Self Care | Payer: Medicare HMO | Source: Ambulatory Visit | Attending: Vascular Surgery | Admitting: Vascular Surgery

## 2018-03-10 ENCOUNTER — Other Ambulatory Visit (HOSPITAL_COMMUNITY): Payer: BLUE CROSS/BLUE SHIELD

## 2018-03-10 ENCOUNTER — Ambulatory Visit (HOSPITAL_COMMUNITY)
Admission: RE | Admit: 2018-03-10 | Discharge: 2018-03-10 | Disposition: A | Discharge status: Home / Self Care | Payer: Medicare HMO | Source: Ambulatory Visit | Attending: Vascular Surgery | Admitting: Vascular Surgery

## 2018-03-10 DIAGNOSIS — I739 Peripheral vascular disease, unspecified: Secondary | ICD-10-CM

## 2018-03-10 DIAGNOSIS — I2510 Atherosclerotic heart disease of native coronary artery without angina pectoris: Secondary | ICD-10-CM | POA: Diagnosis not present

## 2018-03-10 DIAGNOSIS — Z8673 Personal history of transient ischemic attack (TIA), and cerebral infarction without residual deficits: Secondary | ICD-10-CM | POA: Diagnosis not present

## 2018-03-10 DIAGNOSIS — I252 Old myocardial infarction: Secondary | ICD-10-CM | POA: Insufficient documentation

## 2018-03-10 DIAGNOSIS — Z95828 Presence of other vascular implants and grafts: Secondary | ICD-10-CM | POA: Insufficient documentation

## 2018-03-10 DIAGNOSIS — I10 Essential (primary) hypertension: Secondary | ICD-10-CM | POA: Diagnosis not present

## 2018-03-15 ENCOUNTER — Ambulatory Visit: Payer: BLUE CROSS/BLUE SHIELD | Admitting: Family

## 2018-03-15 ENCOUNTER — Encounter: Payer: Self-pay | Admitting: Family

## 2018-03-16 ENCOUNTER — Other Ambulatory Visit: Payer: Self-pay | Admitting: Cardiovascular Disease

## 2018-03-23 ENCOUNTER — Other Ambulatory Visit (HOSPITAL_COMMUNITY): Payer: Self-pay | Admitting: Interventional Radiology

## 2018-03-23 DIAGNOSIS — I771 Stricture of artery: Secondary | ICD-10-CM

## 2018-03-31 DIAGNOSIS — R69 Illness, unspecified: Secondary | ICD-10-CM | POA: Diagnosis not present

## 2018-04-05 ENCOUNTER — Encounter (HOSPITAL_COMMUNITY): Payer: BLUE CROSS/BLUE SHIELD

## 2018-04-05 ENCOUNTER — Other Ambulatory Visit (HOSPITAL_COMMUNITY): Payer: BLUE CROSS/BLUE SHIELD

## 2018-04-05 ENCOUNTER — Ambulatory Visit: Payer: BLUE CROSS/BLUE SHIELD | Admitting: Family

## 2018-04-10 ENCOUNTER — Other Ambulatory Visit: Payer: Self-pay | Admitting: Cardiovascular Disease

## 2018-04-13 ENCOUNTER — Ambulatory Visit (HOSPITAL_COMMUNITY): Payer: Medicare HMO

## 2018-04-13 ENCOUNTER — Ambulatory Visit (HOSPITAL_COMMUNITY)
Admission: RE | Admit: 2018-04-13 | Discharge: 2018-04-13 | Disposition: A | Discharge status: Home / Self Care | Payer: Medicare HMO | Source: Ambulatory Visit | Attending: Interventional Radiology | Admitting: Interventional Radiology

## 2018-04-13 DIAGNOSIS — Y838 Other surgical procedures as the cause of abnormal reaction of the patient, or of later complication, without mention of misadventure at the time of the procedure: Secondary | ICD-10-CM | POA: Insufficient documentation

## 2018-04-13 DIAGNOSIS — I6501 Occlusion and stenosis of right vertebral artery: Secondary | ICD-10-CM | POA: Insufficient documentation

## 2018-04-13 DIAGNOSIS — I63531 Cerebral infarction due to unspecified occlusion or stenosis of right posterior cerebral artery: Secondary | ICD-10-CM | POA: Diagnosis not present

## 2018-04-13 DIAGNOSIS — I771 Stricture of artery: Secondary | ICD-10-CM | POA: Insufficient documentation

## 2018-04-13 DIAGNOSIS — I669 Occlusion and stenosis of unspecified cerebral artery: Secondary | ICD-10-CM | POA: Insufficient documentation

## 2018-04-13 DIAGNOSIS — T82856A Stenosis of peripheral vascular stent, initial encounter: Secondary | ICD-10-CM | POA: Insufficient documentation

## 2018-04-13 LAB — POCT I-STAT CREATININE: CREATININE: 1 mg/dL (ref 0.61–1.24)

## 2018-04-13 MED ORDER — GADOBUTROL 1 MMOL/ML IV SOLN
10.0000 mL | Freq: Once | INTRAVENOUS | Status: AC | PRN
  Administered 2018-04-13: 10 mL via INTRAVENOUS

## 2018-04-15 DIAGNOSIS — I10 Essential (primary) hypertension: Secondary | ICD-10-CM | POA: Diagnosis not present

## 2018-04-15 DIAGNOSIS — E1121 Type 2 diabetes mellitus with diabetic nephropathy: Secondary | ICD-10-CM | POA: Diagnosis not present

## 2018-04-15 DIAGNOSIS — E7800 Pure hypercholesterolemia, unspecified: Secondary | ICD-10-CM | POA: Diagnosis not present

## 2018-04-15 DIAGNOSIS — Z23 Encounter for immunization: Secondary | ICD-10-CM | POA: Diagnosis not present

## 2018-04-15 DIAGNOSIS — E1165 Type 2 diabetes mellitus with hyperglycemia: Secondary | ICD-10-CM | POA: Diagnosis not present

## 2018-04-15 DIAGNOSIS — E1140 Type 2 diabetes mellitus with diabetic neuropathy, unspecified: Secondary | ICD-10-CM | POA: Diagnosis not present

## 2018-04-16 DIAGNOSIS — R69 Illness, unspecified: Secondary | ICD-10-CM | POA: Diagnosis not present

## 2018-04-18 ENCOUNTER — Telehealth (HOSPITAL_COMMUNITY): Payer: Self-pay

## 2018-04-18 NOTE — Telephone Encounter (Signed)
Pt agreed to f/u in 3 months with cerebral angiogram. AW

## 2018-04-18 NOTE — Telephone Encounter (Signed)
Called pt regarding recent mri/mra, no answer, left message for pt to return call. AW

## 2018-04-22 NOTE — Progress Notes (Signed)
[facility]  PATIENT: [name] DOB: Feb 17, 1956   REASON FOR VISIT: Follow-up for obstructive sleep apnea with CPAP, history of stroke HISTORY FROM: Patient    HISTORY OF PRESENT ILLNESS:UPDATE 10/8/2019CM [name] is 62 year old male returns for follow-up with a history of obstructive sleep apnea here for CPAP compliance.  He also has a history of stroke in 2017 and currently without stroke or TIA symptoms since that time.  He is doing well with his CPAP.  Data dated 03/26/2018-04/24/2018 shows compliance greater than 4 hours at 97%.  Average usage 8 hours 46 minutes.  Set pressure 12 cm.  EPR level 3.  Leak 95th percentile 4.5.  AHI 3.  He returns for reevaluation  UPDATE 4/22/2019CM [name], 62 year old male returns for follow-up with a history of obstructive sleep apnea here for CPAP compliance.  He also has a history of stroke in July 2017 without recurrent stroke or TIA symptoms.  CPAP compliance data dated 10/09/2017- 11/07/2017 shows compliance greater than 4 hours at 93%.  Average usage 7 hours 43 minutes.  Set pressure of 12 cm AHI 3.1 EPR level 3.  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 He is currently on Plavix and Eliquis for secondary stroke prevention.He has not had further stroke or TIA symptoms. He has minimal bruising and no bleeding. He remains on TriCor and Niacin for hyperlipidemia and elevated triglycerides. He denies myalgias. Recent random glucose 202. He continues to exercise but recently cut back due to a sore on his foot. His diabetes is in better control.  He denies any dizziness vertigo diplopia balance issues.   Or falls.  CPAP  Compliance  data 04/10/2017 through 05/09/2017  She has 97% greater than 4 hours.  Average usage 7 hours 54 minutes. 12 cm of pressure. EPR 3. AHI 6.2 He returns for reevaluation  UPDATE 04/19/2018CM  [name], 62 year old male  returns for follow-up with hospital admission in July 2017 for pontine stroke. Patient has multiple vascular risk factors including peripheral vascular disease severe hyperlipidemia diabetes hypertension and mild obesity.He is currently on Plavix and eliquis for secondary stroke prevention and atrial fibrillation. He denies any further stroke or TIA symptoms, minimal bruising and no bleeding. He remains on Lipitor for hyperlipidemia, denies myalgias. He claims his diabetes is in better control but is still not where it needs to be. He still has not obtained his split night sleep study. He continues to have some slurring of speech particularly when fatigued. He has not exercising at present and  claims he has decreased stamina due to his peripheral vascular disease. He returns for reevaluation    HISTORY: 04/01/16 [name] is a 62 year old Caucasian male seen today for first office follow-up visit following hospital admission for pontine stroke in July 2017. He is accompanied today by his wife.[name] is a 50 y.o. malewith history of stroke in March 2017, CAD status post CABG, diabetes mellitus type 2, hypertriglyceridemia, peripheral vascular disease status post femoro/tibial bypass in February 2017 presents to the ER because of persistent difficulty with expressive aphasia and dizziness. Patient's symptoms started on 01/27/2016 when he woke up in the morning. CT of the head did not show anything acute. MRI of the brain shows left pontine infarct.  . Patient on exam is able to move all extremities without difficulty but still has expressive aphasia. No obvious facial asymmetry. Patient  is beyond the window period for TPA. [name] admitted for further evaluation and treatment. On the night after admission he developed worsening neurological status with dysarthria and dysphagia and right facial droop as well as right-sided weakness. Repeat CT scan showed no acute abnormality. 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 Patient had known evidence of posterior circulation disease from previous admission in March 2017. 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 He was seen by Dr. Estanislado Pandy and plan was to do elective basilar artery stenting. He returned a week later and underwent stenting by Dr. Estanislado Pandy on 02/04/16 which was uneventful. Patient postprocedure developed new-onset atrial fibrillation and was started on eliquis and Plavix was continued for the stent. Patient has multiple vascular risk factors including peripheral vascular disease severe hyperlipidemia diabetes hypertension and mild obesity. He states his done well since discharge. He still has some slurred speech. His finish home physical occupational therapy but is starting outpatient therapy soon. Still has some occasional slurred speech and is tired. He can walk independently. His stamina and walking is limited mainly from his peripheral vascular disease. Patient is having only minor bruising but no bleeding. Patient does admit to snoring as well as wife has noticed witnessed apneas. He is not been evaluated for sleep apnea with a polysomnogram yet but is willing to get it done. He started seeing a physician and to help manage his triglycerides and lipids. He has an upcoming appointment to see Dr. Estanislado Pandy to follow this basilar stent.    REVIEW OF SYSTEMS: Full 14 system review of systems performed and notable only for those listed, all others are neg:  Constitutional:  neg Cardiovascular: neg Ear/Nose/Throat: neg  Skin: neg Eyes: neg Respiratory:  neg Gastroitestinal: neg  Hematology/Lymphatic: neg  Endocrine: neg Musculoskeletal:neg Allergy/Immunology: neg Neurological: history of stroke Psychiatric: neg Sleep :  Obstructive sleep apnea with CPAP ALLERGIES: No Known Allergies  HOME MEDICATIONS: Outpatient Medications Prior to Visit    Medication Sig Dispense Refill  . clopidogrel (PLAVIX) 75 MG tablet TAKE 1 TABLET BY MOUTH DAILY 90 tablet 0  . ELIQUIS 5 MG TABS tablet TAKE 1 TABLET BY MOUTH TWICE A DAY 180 tablet 1  . fenofibrate (TRICOR) 145 MG tablet TAKE 1 TABLET BY MOUTH EVERY DAY 90 tablet 0  . gabapentin (NEURONTIN) 300 MG capsule Take 300-600 mg by mouth 2 (two) times daily. Take 300mg  in the morning and 600mg  in the evening.    . insulin aspart (NOVOLOG) 100 UNIT/ML injection Inject 40 Units into the skin 3 (three) times daily before meals. 10 mL 11  . LEVEMIR FLEXTOUCH 100 UNIT/ML Pen Inject 80 Units into the skin 2 (two) times daily. Reported on 08/21/2015 (Patient taking differently: Inject 75 Units into the skin 2 (two) times daily. Reported on 08/21/2015) 15 mL 4  . lisinopril-hydrochlorothiazide (PRINZIDE,ZESTORETIC) 20-12.5 MG per tablet Take 1 tablet by mouth daily with breakfast.     . metFORMIN (GLUCOPHAGE-XR) 500 MG 24 hr tablet Take 1 tablet (500 mg total) by mouth 2 (two) times daily.    . metoprolol succinate (TOPROL-XL) 25 MG 24 hr tablet TAKE 1 TABLET (25 MG TOTAL) BY MOUTH DAILY. *DUE 9/17* 90 tablet 3  . naproxen sodium (ALEVE) 220 MG tablet Take 220 mg by mouth daily.    . niacin 500 MG tablet Take 500 mg by mouth daily after supper.    [name] apixaban (ELIQUIS) 5 MG TABS tablet Take  1 tablet (5 mg total) by mouth 2 (two) times daily. Schedule follow up appointment prior to next refill request 180 tablet 0   No facility-administered medications prior to visit.     PAST MEDICAL HISTORY: Past Medical History:  Diagnosis Date  . Arthritis    "hx; cleaned it out of both shoulders"  . CAD (coronary artery disease)    OV, Dr Harlow Asa, [facility] 5/12 on chart  EKG 10/12 EPIC,  chest x ray 01/07/11 EPIC  . Carpal tunnel syndrome    peripheral neuropathy  . Chronic shoulder pain    "both"  . DVT (deep venous thrombosis) (HCC)    hx LLE  . History of kidney stones   . Hyperlipidemia   . Hypertension   .  Myocardial infarction ([place]) 02/2001  . Neuropathy, peripheral    both feet  . Peripheral vascular disease, unspecified ([place]) 03/2015   PCI to the right popliteal  . Pseudobulbar affect   . Skin cancer    "have had them cut or burned off my face" (03/28/2015)  . Stroke ([place]) 10/10/2015  . Type II diabetes mellitus ([place])     PAST SURGICAL HISTORY: Past Surgical History:  Procedure Laterality Date  . APPENDECTOMY  1977  . CARDIAC CATHETERIZATION  2002      . CARPAL TUNNEL RELEASE Right 2000's  . CARPAL TUNNEL RELEASE  12/18/2011   Procedure: CARPAL TUNNEL RELEASE;  Surgeon: Mcarthur Rossetti, MD;  Location: [facility];  Service: Orthopedics;  Laterality: Left;  Left Open Carpal Tunnel Release  . CORONARY ANGIOPLASTY    . CORONARY ARTERY BYPASS GRAFT  2002   CABG X 4  . CYSTOSCOPY  several done in past  . FEMORAL-TIBIAL BYPASS GRAFT Left 01/07/11   fem-posterior tibial BPG using reversed left GSV               12/15/11 OK BY DR Ninfa Linden TO CONTINUE ASA AND PLAVIX  . FEMORAL-TIBIAL BYPASS GRAFT Left 09/06/2015   Procedure: LEFT FEMORAL-POSTERIOR TIBIAL ARTERY BYPASS GRAFT WITH COMPOSITE PTFE AND RIGHT ARM VEIN;  Surgeon: Elam Dutch, MD;  Location: [facility];  Service: Vascular;  Laterality: Left;  . FEMOROPOPLITEAL THROMBECTOMY / EMBOLECTOMY  ~ 2010  . FRACTURE SURGERY    . IR ANGIO INTRA EXTRACRAN SEL COM CAROTID INNOMINATE BILAT MOD SED  03/02/2017  . IR ANGIO VERTEBRAL SEL VERTEBRAL UNI L MOD SED  03/02/2017  . IR ANGIOGRAM EXTREMITY RIGHT  03/02/2017  . IR GENERIC HISTORICAL  02/18/2016   IR RADIOLOGIST EVAL & MGMT 02/18/2016 [facility]  . IR GENERIC HISTORICAL  07/30/2016   IR ANGIO VERTEBRAL SEL VERTEBRAL UNI L MOD SED 07/30/2016 Luanne Bras, MD [facility]  . IR GENERIC HISTORICAL  07/30/2016   IR ANGIO INTRA EXTRACRAN SEL COM CAROTID INNOMINATE BILAT MOD SED 07/30/2016 Luanne Bras, MD [facility]  . KNEE ARTHROSCOPY Left X 2  . LAPAROSCOPIC CHOLECYSTECTOMY  1990's  .  LITHOTRIPSY  several done in past  . ORIF RADIUS & ULNA FRACTURES Left   . PERIPHERAL VASCULAR CATHETERIZATION N/A 03/28/2015   Procedure: Lower Extremity Angiography;  Surgeon: Lorretta Harp, MD;  Location: [place] [facility] [facility];  Service: Cardiovascular;  Laterality: N/A;  . PERIPHERAL VASCULAR CATHETERIZATION Right 03/28/2015   Procedure: Peripheral Vascular Atherectomy;  Surgeon: Lorretta Harp, MD;  Location: [facility];  Service: Cardiovascular;  Laterality: Right;  popliteal;   . PERIPHERAL VASCULAR CATHETERIZATION N/A 08/23/2015   Procedure: Abdominal Aortogram;  Surgeon: Juanda Crumble  Antony Blackbird, MD;  Location: [facility] [facility];  Service: Cardiovascular;  Laterality: N/A;  . POPLITEAL ARTERY STENT Left 2010-2012 X 4  . RADIOLOGY WITH ANESTHESIA N/A 02/04/2016   Procedure: Basilar artery angioplasty with stenting;  Surgeon: Luanne Bras, MD;  Location: [facility];  Service: Radiology;  Laterality: N/A;  . SHOULDER ARTHROSCOPY Left   . SHOULDER ARTHROSCOPY Right 12/18/2011  . SHOULDER ARTHROSCOPY  07/08/2012   Procedure: ARTHROSCOPY SHOULDER;  Surgeon: Mcarthur Rossetti, MD;  Location: [facility];  Service: Orthopedics;  Laterality: Left;  Left Shoulder Arthroscopy with Manipulation and Extensive Debridement  . SHOULDER ARTHROSCOPY WITH ROTATOR CUFF REPAIR Left 07/14/2013   Procedure: LEFT SHOULDER ARTHROSCOPY WITH EXTENSIVE DEBRIDEMENT, DISTAL CLAVICLE REPAIR;  Surgeon: Mcarthur Rossetti, MD;  Location: [facility];  Service: Orthopedics;  Laterality: Left;  . SKIN CANCER EXCISION     "left side of my forehead"  . VEIN HARVEST Right 09/06/2015   Procedure: RIGHT ARM VEIN HARVEST;  Surgeon: Elam Dutch, MD;  Location: [facility];  Service: Vascular;  Laterality: Right;    FAMILY HISTORY: Family History  Problem Relation Age of Onset  . Cancer Mother        Breast and Brain tumor  . Cancer Father        Blood vessel tumor    SOCIAL HISTORY: Social History   Socioeconomic History    . Marital status: Widowed    Spouse name: Not on file  . Number of children: Not on file  . Years of education: Not on file  . Highest education level: Not on file  Occupational History  . Not on file  Social Needs  . Financial resource strain: Not on file  . Food insecurity:    Worry: Not on file    Inability: Not on file  . Transportation needs:    Medical: Not on file    Non-medical: Not on file  Tobacco Use  . Smoking status: Never Smoker  . Smokeless tobacco: Never Used  Substance and Sexual Activity  . Alcohol use: No  . Drug use: No  . Sexual activity: Yes  Lifestyle  . Physical activity:    Days per week: Not on file    Minutes per session: Not on file  . Stress: Not on file  Relationships  . Social connections:    Talks on phone: Not on file    Gets together: Not on file    Attends religious service: Not on file    Active member of club or organization: Not on file    Attends meetings of clubs or organizations: Not on file    Relationship status: Not on file  . Intimate partner violence:    Fear of current or ex partner: Not on file    Emotionally abused: Not on file    Physically abused: Not on file    Forced sexual activity: Not on file  Other Topics Concern  . Not on file  Social History Narrative  . Not on file     PHYSICAL EXAM  Vitals:   04/26/18 0938  BP: (!) 149/78  Pulse: 60  Weight: 253 lb 9.6 oz (115 kg)  Height: 5\' 10"  (1.778 m)   Body mass index is 36.39 kg/m.  Generalized: Well developed, Obese male in no acute distress  Head: normocephalic and atraumatic,. Oropharynx benign [place] 4-5 Neck: Supple, no carotid bruits supply of 18.5 Musculoskeletal: No deformity Skin no edema or rash  Neurological examination  Mentation: Alert oriented to time, place, history taking. Attention span and concentration appropriate. Recent and remote memory intact.  Follows all commands speech is and language fluent.   Cranial nerve II-XII:  Pupils were equal round reactive to light extraocular movements were full, visual field were full on confrontational test. Facial sensation and strength were normal. hearing was intact to finger rubbing bilaterally. Uvula tongue midline. head turning and shoulder shrug were normal and symmetric.Tongue protrusion into cheek strength was normal. Motor: normal bulk and tone, full strength in the BUE, BLE, fine finger movements normal, no pronator drift. No focal weakness Sensory: normal and symmetric to light touch,  on the face arms and legs Coordination: finger-nose-finger, heel-to-shin bilaterally, no dysmetria Gait and Station: Rising up from seated position without assistance,  wide based stance,  moderate stride, good arm swing, smooth turning, able to perform tiptoe, and heel walking .  Tandem gait is steady.  No assistive device DIAGNOSTIC DATA (LABS, IMAGING, TESTING) - I reviewed patient records, labs, notes, testing and imaging myself where available.  Lab Results  Component Value Date   WBC 8.1 03/02/2017   HGB 13.2 03/02/2017   HCT 40.4 03/02/2017   MCV 86.1 03/02/2017   PLT 249 03/02/2017      Component Value Date/Time   NA 138 03/02/2017 0811   K 4.4 03/02/2017 0811   CL 106 03/02/2017 0811   CO2 24 03/02/2017 0811   GLUCOSE 271 (H) 03/02/2017 0811   BUN 23 (H) 03/02/2017 0811   CREATININE 1.00 04/13/2018 1053   CREATININE 0.93 03/22/2015 0001   CALCIUM 9.5 03/02/2017 0811   PROT 6.8 04/23/2016 1108   ALBUMIN 3.7 04/23/2016 1108   AST 19 04/23/2016 1108   ALT 16 (L) 04/23/2016 1108   ALKPHOS 46 04/23/2016 1108   BILITOT 0.5 04/23/2016 1108   GFRNONAA >60 09/15/2017 1148   GFRAA >60 09/15/2017 1148   Lab Results  Component Value Date   CHOL 523 (H) 01/29/2016   HDL NOT REPORTED DUE TO HIGH TRIGLYCERIDES 01/29/2016   LDLCALC UNABLE TO CALCULATE IF TRIGLYCERIDE OVER 400 mg/dL 01/29/2016   LDLDIRECT 87 01/16/2015   TRIG 1,925 (H) 01/29/2016   CHOLHDL NOT REPORTED  DUE TO HIGH TRIGLYCERIDES 01/29/2016   Lab Results  Component Value Date   HGBA1C 11.5 (H) 01/29/2016    Lab Results  Component Value Date   TSH 2.497 01/29/2016      ASSESSMENT AND PLAN 36 year Caucasian male with recurrent pontine infarct secondary to symptomatic basilar stenosis in July 2017 status post elective angioplasty stenting,  atrial fibrillation. Multiple vascular risk factors of obesity, diabetes, hypertension, hyperlipidemia, peripheral vascular disease and intracranial stenosis.  Now with obstructive sleep apnea on CPAP.    Data dated 03/26/2018-04/24/2018 shows compliance greater than 4 hours at 97%.  Average usage 8 hours 46 minutes.  Set pressure 12 cm.  EPR level 3.  Leak 95th percentile 4.5.  AHI 3.   PLAN CPAP compliance 97% Continue same settings Will order supplies Follow-up yearly for repeat compliance Dennie Bible, [facility], [facility], APRN  [facility] [address], [place], [place] [address] [phone]

## 2018-04-26 ENCOUNTER — Encounter: Payer: Self-pay | Admitting: Nurse Practitioner

## 2018-04-26 ENCOUNTER — Ambulatory Visit: Payer: Medicare HMO | Admitting: Nurse Practitioner

## 2018-04-26 VITALS — BP 149/78 | HR 60 | Ht 70.0 in | Wt 253.6 lb

## 2018-04-26 DIAGNOSIS — G4733 Obstructive sleep apnea (adult) (pediatric): Secondary | ICD-10-CM | POA: Diagnosis not present

## 2018-04-26 DIAGNOSIS — Z9989 Dependence on other enabling machines and devices: Secondary | ICD-10-CM | POA: Diagnosis not present

## 2018-04-26 NOTE — Patient Instructions (Signed)
CPAP compliance 97% Continue same settings Will order supplies Follow-up yearly for compliance

## 2018-04-26 NOTE — Progress Notes (Signed)
cpap supplies   Received: Today  Message Contents  Kiger, Marina Goodell, Elliot Dally, RN    Message sent that had new cpap orders.     got it

## 2018-04-27 DIAGNOSIS — R69 Illness, unspecified: Secondary | ICD-10-CM | POA: Diagnosis not present

## 2018-05-10 DIAGNOSIS — G4733 Obstructive sleep apnea (adult) (pediatric): Secondary | ICD-10-CM | POA: Diagnosis not present

## 2018-05-12 DIAGNOSIS — R69 Illness, unspecified: Secondary | ICD-10-CM | POA: Diagnosis not present

## 2018-05-18 ENCOUNTER — Other Ambulatory Visit: Payer: Self-pay | Admitting: Cardiovascular Disease

## 2018-05-18 NOTE — Telephone Encounter (Signed)
Rx request sent to pharmacy.  

## 2018-05-19 ENCOUNTER — Other Ambulatory Visit: Payer: Self-pay | Admitting: Cardiovascular Disease

## 2018-05-19 NOTE — Telephone Encounter (Signed)
Rx has been sent to the pharmacy electronically. ° °

## 2018-05-27 ENCOUNTER — Telehealth: Payer: Self-pay | Admitting: Nurse Practitioner

## 2018-05-27 NOTE — Telephone Encounter (Signed)
Pt requesting a call to discuss his CPAP, did not wish to discuss with me

## 2018-05-27 NOTE — Telephone Encounter (Signed)
Spoke to pt.  He would like to have the mask that he had previously (04/2017) note from visit with Dr. Brett Fairy. He was uncertain of name.  But the one just sent to him was not working for him.  I told him that the [facility] would be the one to ask relating to masks and supplies, but would call for him this time.  I spoke to [facility] and she will call pt. I also gave # to pt [phone].

## 2018-06-09 DIAGNOSIS — R69 Illness, unspecified: Secondary | ICD-10-CM | POA: Diagnosis not present

## 2018-06-13 ENCOUNTER — Other Ambulatory Visit: Payer: Self-pay | Admitting: Cardiovascular Disease

## 2018-06-13 NOTE — Telephone Encounter (Signed)
Rx request sent to pharmacy.  

## 2018-06-17 ENCOUNTER — Other Ambulatory Visit: Payer: Self-pay | Admitting: Cardiovascular Disease

## 2018-07-04 ENCOUNTER — Other Ambulatory Visit: Payer: Self-pay | Admitting: Cardiovascular Disease

## 2018-07-04 NOTE — Telephone Encounter (Signed)
Rx request sent to pharmacy.  

## 2018-07-16 DIAGNOSIS — R69 Illness, unspecified: Secondary | ICD-10-CM | POA: Diagnosis not present

## 2018-07-25 ENCOUNTER — Telehealth (HOSPITAL_COMMUNITY): Payer: Self-pay

## 2018-07-25 ENCOUNTER — Other Ambulatory Visit (HOSPITAL_COMMUNITY): Payer: Self-pay | Admitting: Interventional Radiology

## 2018-07-25 DIAGNOSIS — I771 Stricture of artery: Secondary | ICD-10-CM

## 2018-07-25 NOTE — Telephone Encounter (Signed)
Called to schedule angiogram, no answer, left vm. AW 

## 2018-07-27 ENCOUNTER — Other Ambulatory Visit: Payer: Self-pay | Admitting: Cardiovascular Disease

## 2018-08-03 DIAGNOSIS — R69 Illness, unspecified: Secondary | ICD-10-CM | POA: Diagnosis not present

## 2018-08-08 ENCOUNTER — Other Ambulatory Visit: Payer: Self-pay | Admitting: Radiology

## 2018-08-09 ENCOUNTER — Ambulatory Visit (HOSPITAL_COMMUNITY)
Admission: RE | Admit: 2018-08-09 | Discharge: 2018-08-09 | Disposition: A | Discharge status: Home / Self Care | Payer: Medicare HMO | Source: Ambulatory Visit | Attending: Interventional Radiology | Admitting: Interventional Radiology

## 2018-08-09 ENCOUNTER — Encounter (HOSPITAL_COMMUNITY): Payer: Self-pay

## 2018-08-09 DIAGNOSIS — I771 Stricture of artery: Secondary | ICD-10-CM | POA: Diagnosis not present

## 2018-08-09 DIAGNOSIS — I651 Occlusion and stenosis of basilar artery: Secondary | ICD-10-CM | POA: Diagnosis not present

## 2018-08-09 DIAGNOSIS — I6622 Occlusion and stenosis of left posterior cerebral artery: Secondary | ICD-10-CM | POA: Diagnosis not present

## 2018-08-09 LAB — CBC
HCT: 42.7 % (ref 39.0–52.0)
Hemoglobin: 13.2 g/dL (ref 13.0–17.0)
MCH: 28.1 pg (ref 26.0–34.0)
MCHC: 30.9 g/dL (ref 30.0–36.0)
MCV: 91 fL (ref 80.0–100.0)
NRBC: 0 % (ref 0.0–0.2)
Platelets: 263 10*3/uL (ref 150–400)
RBC: 4.69 MIL/uL (ref 4.22–5.81)
RDW: 14.6 % (ref 11.5–15.5)
WBC: 7.6 10*3/uL (ref 4.0–10.5)

## 2018-08-09 LAB — BASIC METABOLIC PANEL
ANION GAP: 9 (ref 5–15)
BUN: 21 mg/dL (ref 8–23)
CALCIUM: 9.6 mg/dL (ref 8.9–10.3)
CHLORIDE: 108 mmol/L (ref 98–111)
CO2: 22 mmol/L (ref 22–32)
Creatinine, Ser: 1.4 mg/dL — ABNORMAL HIGH (ref 0.61–1.24)
GFR calc non Af Amer: 53 mL/min — ABNORMAL LOW (ref >60)
Glucose, Bld: 172 mg/dL — ABNORMAL HIGH (ref 70–99)
Potassium: 3.9 mmol/L (ref 3.5–5.1)
Sodium: 139 mmol/L (ref 135–145)

## 2018-08-09 LAB — PROTIME-INR
INR: 1.04
Prothrombin Time: 13.5 seconds (ref 11.4–15.2)

## 2018-08-09 MED ORDER — NITROGLYCERIN 1 MG/10 ML FOR IR/CATH LAB
INTRA_ARTERIAL | Status: AC
  Filled 2018-08-09: qty 10

## 2018-08-09 MED ORDER — FENTANYL CITRATE (PF) 100 MCG/2ML IJ SOLN
INTRAMUSCULAR | Status: AC
  Filled 2018-08-09: qty 4

## 2018-08-09 MED ORDER — SODIUM CHLORIDE 0.9 % IV SOLN
Freq: Once | INTRAVENOUS | Status: AC
  Administered 2018-08-09: 07:00:00 via INTRAVENOUS

## 2018-08-09 MED ORDER — SODIUM CHLORIDE 0.9 % IV SOLN
INTRAVENOUS | Status: DC | PRN
  Administered 2018-08-09: 10 mL/h via INTRAVENOUS

## 2018-08-09 MED ORDER — MIDAZOLAM HCL 2 MG/2ML IJ SOLN
INTRAMUSCULAR | Status: AC
  Filled 2018-08-09: qty 4

## 2018-08-09 MED ORDER — HEPARIN SODIUM (PORCINE) 1000 UNIT/ML IJ SOLN
INTRAMUSCULAR | Status: AC
  Filled 2018-08-09: qty 1

## 2018-08-09 MED ORDER — LIDOCAINE-PRILOCAINE 2.5-2.5 % EX CREA
TOPICAL_CREAM | CUTANEOUS | Status: AC
  Administered 2018-08-09: 07:00:00 via TOPICAL
  Filled 2018-08-09: qty 5

## 2018-08-09 MED ORDER — VERAPAMIL HCL 2.5 MG/ML IV SOLN
INTRAVENOUS | Status: AC
  Filled 2018-08-09: qty 2

## 2018-08-09 NOTE — H&P (Signed)
Chief Complaint: Patient was seen in consultation today for diagnostic cerebral angiogram.  Referring Physician(s): Deveshwar,Sanjeev  Supervising Physician: Luanne Bras  Patient Status: [facility] - Out-pt  History of Present Illness: [name] is a 63 y.o. male with a past medical history significant for CAD, DVT, HTN, HLD, MI (2002), PVD, DM II, neuropathy and CVA (02/04/2016) s/p stent assisted angioplasty of the basilar artery 02/07/16 with Dr. Estanislado Pandy who presents today for diagnostic cerebral angiogram. He underwent MR brain/MRA head on 04/13/18 which showed unchanged appearance of the stented basilar artery with approximately 40% proximal in stent stenosis; chronic severe narrowing of the right PCA P3 segment; unchanged chronic severe stenosis of the distal right vertebral artery; multiple small vessel infarct of the corpus striatum, corona radiate and pons (unchanged); short segment occlusion of the proximal pericallosal branch of the anterior cerebral arteries. Decision was made after review of results to proceed with follow up diagnostic cerebral angiogram to evaluate above findings.  On arrival to radiology, RN reports patient significant dyspneic which improved after resting for several minutes as well bilateral pedal edema. Patient reports that he has gained quite a bit of weight since a change was made in his diabetes medicine and that he "had to a walk a mile and a half to get here" which causes him to be out of breath. He denies dyspnea at rest or difficulty breathing when lying flat. He reports that the swelling in his lower extremities is most in his ankles and is worse if he has been standing for awhile. His wife reports that he spends a lot of time in his recliner with his feet "hanging off the sides" and they notice that this worsens the swelling in his ankles. He follows regularly with cardiology and has his yearly appointment with Dr. Gwenlyn Found in a few weeks. He states  understanding of procedure and wishes to proceed.   Past Medical History:  Diagnosis Date  . Arthritis    "hx; cleaned it out of both shoulders"  . CAD (coronary artery disease)    OV, Dr Harlow Asa, [facility] 5/12 on chart  EKG 10/12 EPIC,  chest x ray 01/07/11 EPIC  . Carpal tunnel syndrome    peripheral neuropathy  . Chronic shoulder pain    "both"  . DVT (deep venous thrombosis) (HCC)    hx LLE  . History of kidney stones   . Hyperlipidemia   . Hypertension   . Myocardial infarction ([employer]) 02/2001  . Neuropathy, peripheral    both feet  . Peripheral vascular disease, unspecified ([place]) 03/2015   PCI to the right popliteal  . Pseudobulbar affect   . Skin cancer    "have had them cut or burned off my face" (03/28/2015)  . Stroke ([place]) 10/10/2015  . Type II diabetes mellitus ([place])     Past Surgical History:  Procedure Laterality Date  . APPENDECTOMY  1977  . CARDIAC CATHETERIZATION  2002      . CARPAL TUNNEL RELEASE Right 2000's  . CARPAL TUNNEL RELEASE  12/18/2011   Procedure: CARPAL TUNNEL RELEASE;  Surgeon: Mcarthur Rossetti, MD;  Location: [facility];  Service: Orthopedics;  Laterality: Left;  Left Open Carpal Tunnel Release  . CORONARY ANGIOPLASTY    . CORONARY ARTERY BYPASS GRAFT  2002   CABG X 4  . CYSTOSCOPY  several done in past  . FEMORAL-TIBIAL BYPASS GRAFT Left 01/07/11   fem-posterior tibial BPG using reversed left GSV  12/15/11 OK BY DR Ninfa Linden TO CONTINUE ASA AND PLAVIX  . FEMORAL-TIBIAL BYPASS GRAFT Left 09/06/2015   Procedure: LEFT FEMORAL-POSTERIOR TIBIAL ARTERY BYPASS GRAFT WITH COMPOSITE PTFE AND RIGHT ARM VEIN;  Surgeon: Elam Dutch, MD;  Location: [place];  Service: Vascular;  Laterality: Left;  . FEMOROPOPLITEAL THROMBECTOMY / EMBOLECTOMY  ~ 2010  . FRACTURE SURGERY    . IR ANGIO INTRA EXTRACRAN SEL COM CAROTID INNOMINATE BILAT MOD SED  03/02/2017  . IR ANGIO VERTEBRAL SEL VERTEBRAL UNI L MOD SED  03/02/2017  . IR ANGIOGRAM EXTREMITY RIGHT   03/02/2017  . IR GENERIC HISTORICAL  02/18/2016   IR RADIOLOGIST EVAL & MGMT 02/18/2016 [facility]  . IR GENERIC HISTORICAL  07/30/2016   IR ANGIO VERTEBRAL SEL VERTEBRAL UNI L MOD SED 07/30/2016 Luanne Bras, MD [facility]  . IR GENERIC HISTORICAL  07/30/2016   IR ANGIO INTRA EXTRACRAN SEL COM CAROTID INNOMINATE BILAT MOD SED 07/30/2016 Luanne Bras, MD [facility]  . KNEE ARTHROSCOPY Left X 2  . LAPAROSCOPIC CHOLECYSTECTOMY  1990's  . LITHOTRIPSY  several done in past  . ORIF RADIUS & ULNA FRACTURES Left   . PERIPHERAL VASCULAR CATHETERIZATION N/A 03/28/2015   Procedure: Lower Extremity Angiography;  Surgeon: Lorretta Harp, MD;  Location: [place] [facility];  Service: Cardiovascular;  Laterality: N/A;  . PERIPHERAL VASCULAR CATHETERIZATION Right 03/28/2015   Procedure: Peripheral Vascular Atherectomy;  Surgeon: Lorretta Harp, MD;  Location: [place] [facility];  Service: Cardiovascular;  Laterality: Right;  popliteal;   . PERIPHERAL VASCULAR CATHETERIZATION N/A 08/23/2015   Procedure: Abdominal Aortogram;  Surgeon: Elam Dutch, MD;  Location: [place] [facility];  Service: Cardiovascular;  Laterality: N/A;  . POPLITEAL ARTERY STENT Left 2010-2012 X 4  . RADIOLOGY WITH ANESTHESIA N/A 02/04/2016   Procedure: Basilar artery angioplasty with stenting;  Surgeon: Luanne Bras, MD;  Location: [place];  Service: Radiology;  Laterality: N/A;  . SHOULDER ARTHROSCOPY Left   . SHOULDER ARTHROSCOPY Right 12/18/2011  . SHOULDER ARTHROSCOPY  07/08/2012   Procedure: ARTHROSCOPY SHOULDER;  Surgeon: Mcarthur Rossetti, MD;  Location: [facility];  Service: Orthopedics;  Laterality: Left;  Left Shoulder Arthroscopy with Manipulation and Extensive Debridement  . SHOULDER ARTHROSCOPY WITH ROTATOR CUFF REPAIR Left 07/14/2013   Procedure: LEFT SHOULDER ARTHROSCOPY WITH EXTENSIVE DEBRIDEMENT, DISTAL CLAVICLE REPAIR;  Surgeon: Mcarthur Rossetti, MD;  [facility];  Service: Orthopedics;   Laterality: Left;  . SKIN CANCER EXCISION     "left side of my forehead"  . VEIN HARVEST Right 09/06/2015   Procedure: RIGHT ARM VEIN HARVEST;  Surgeon: Elam Dutch, MD;  Location: [place];  Service: Vascular;  Laterality: Right;    Allergies: Patient has no known allergies.  Medications: Prior to Admission medications   Medication Sig Start Date End Date Taking? Authorizing Provider  atorvastatin (LIPITOR) 40 MG tablet Take 40 mg by mouth daily.   Yes [provider]  clopidogrel (PLAVIX) 75 MG tablet TAKE 1 TABLET BY MOUTH DAILY 07/04/18  Yes Lorretta Harp, MD  ELIQUIS 5 MG TABS tablet TAKE 1 TABLET BY MOUTH TWICE A DAY 05/24/17  Yes Lorretta Harp, MD  empagliflozin (JARDIANCE) 10 MG TABS tablet Take 10 mg by mouth daily.   Yes [provider]  fenofibrate (TRICOR) 145 MG tablet Take 1 tablet (145 mg total) by mouth daily. Please schedule appointment for refills. 06/13/18  Yes Lorretta Harp, MD  gabapentin (NEURONTIN) 300 MG capsule Take 300  mg by mouth 3 (three) times daily.    Yes [provider]  insulin NPH-regular Human (70-30) 100 UNIT/ML injection Inject 25-75 Units into the skin See admin instructions. 75 units with breakfast and dinner, and 25 units at lunch   Yes [provider]  lisinopril-hydrochlorothiazide (PRINZIDE,ZESTORETIC) 20-12.5 MG per tablet Take 1 tablet by mouth daily with breakfast.    Yes [provider]  metFORMIN (GLUCOPHAGE-XR) 500 MG 24 hr tablet Take 1 tablet (500 mg total) by mouth 2 (two) times daily. 02/01/16  Yes Hongalgi, Lenis Dickinson, MD  metoprolol succinate (TOPROL-XL) 25 MG 24 hr tablet TAKE 1 TABLET (25 MG TOTAL) BY MOUTH DAILY. *DUE 9/17* Patient taking differently: Take 25 mg by mouth daily.  05/18/18  Yes Lorretta Harp, MD  naproxen sodium (ALEVE) 220 MG tablet Take 220 mg by mouth daily.   Yes [provider]  niacin 500 MG tablet Take 500 mg by mouth daily after supper.   Yes  [provider]     Family History  Problem Relation Age of Onset  . Cancer Mother        Breast and Brain tumor  . Cancer Father        Blood vessel tumor    Social History   Socioeconomic History  . Marital status: Widowed    Spouse name: Not on file  . Number of children: Not on file  . Years of education: Not on file  . Highest education level: Not on file  Occupational History  . Not on file  Social Needs  . Financial resource strain: Not on file  . Food insecurity:    Worry: Not on file    Inability: Not on file  . Transportation needs:    Medical: Not on file    Non-medical: Not on file  Tobacco Use  . Smoking status: Never Smoker  . Smokeless tobacco: Never Used  Substance and Sexual Activity  . Alcohol use: No  . Drug use: No  . Sexual activity: Yes  Lifestyle  . Physical activity:    Days per week: Not on file    Minutes per session: Not on file  . Stress: Not on file  Relationships  . Social connections:    Talks on phone: Not on file    Gets together: Not on file    Attends religious service: Not on file    Active member of club or organization: Not on file    Attends meetings of clubs or organizations: Not on file    Relationship status: Not on file  Other Topics Concern  . Not on file  Social History Narrative  . Not on file     Review of Systems: A 12 point ROS discussed and pertinent positives are indicated in the HPI above.  All other systems are negative.  Review of Systems  Constitutional: Positive for appetite change (Increased since medication change). Negative for chills, diaphoresis and fever.  HENT: Negative for tinnitus and trouble swallowing.   Eyes: Negative for visual disturbance.  Respiratory: Positive for shortness of breath (with exertion). Negative for cough.   Cardiovascular: Positive for leg swelling. Negative for chest pain and palpitations.  Gastrointestinal: Negative for abdominal pain, blood in stool,  diarrhea, nausea and vomiting.  Genitourinary: Negative for dysuria and hematuria.  Neurological: Negative for dizziness and syncope.  Psychiatric/Behavioral: Negative for confusion.    Vital Signs: BP (!) 172/79   Pulse (!) 57   Temp 98.3 F (  36.8 C) (Oral)   Resp 20   Ht 5\' 10"  (1.778 m)   Wt 260 lb (117.9 kg)   SpO2 96%   BMI 37.31 kg/m   Physical Exam Vitals signs reviewed.  Constitutional:      General: He is not in acute distress.    Appearance: He is obese. He is not ill-appearing or diaphoretic.     Comments: Pleasant, talkative, able to speak in full sentences easily.   HENT:     Head: Normocephalic.  Cardiovascular:     Rate and Rhythm: Normal rate and regular rhythm.  Pulmonary:     Effort: Pulmonary effort is normal.     Breath sounds: Normal breath sounds.  Abdominal:     General: Bowel sounds are normal.     Tenderness: There is no abdominal tenderness.  Musculoskeletal:     Right lower leg: Edema (2+ pitting) present.     Left lower leg: Edema (2+ pitting) present.  Skin:    General: Skin is warm and dry.  Neurological:     Mental Status: He is alert and oriented to person, place, and time.  Psychiatric:        Mood and Affect: Mood normal.        Behavior: Behavior normal.        Thought Content: Thought content normal.        Judgment: Judgment normal.      MD Evaluation Airway: WNL Heart: WNL Abdomen: WNL Chest/ Lungs: WNL ASA  Classification: 3 Mallampati/Airway Score: Two   Imaging: No results found.  Labs:  CBC: Recent Labs    08/09/18 0648  WBC 7.6  HGB 13.2  HCT 42.7  PLT 263    COAGS: Recent Labs    08/09/18 0648  INR 1.04    BMP: Recent Labs    09/15/17 1148 04/13/18 1053 08/09/18 0648  NA  --   --  139  K  --   --  3.9  CL  --   --  108  CO2  --   --  22  GLUCOSE  --   --  172*  BUN  --   --  21  CALCIUM  --   --  9.6  CREATININE 1.16 1.00 1.40*  GFRNONAA >60  --  53*  GFRAA >60  --  >60     LIVER FUNCTION TESTS: No results for input(s): BILITOT, AST, ALT, ALKPHOS, PROT, ALBUMIN in the last 8760 hours.  TUMOR MARKERS: No results for input(s): AFPTM, CEA, CA199, CHROMGRNA in the last 8760 hours.  Assessment and Plan:  Patient with history of stent assisted angioplasty of the basilar artery 02/07/16 with Dr. Estanislado Pandy who presents today for diagnostic cerebral angiogram to further evaluate recent MRI/MRA findings.   Patient has been NPO since 10 pm last night, last dose of Eliquis 1/18. Afebrile, WBC 7.6, hgb 13.2, plt 263, INR 1.04, creatinine 1.40.  Risks and benefits of cerebral angiogram were discussed with the patient including, but not limited to bleeding, infection, vascular injury or contrast induced renal failure. This interventional procedure involves the use of X-rays and because of the nature of the planned procedure, it is possible that we will have prolonged use of X-ray fluoroscopy. Potential radiation risks to you include (but are not limited to) the following: - A slightly elevated risk for cancer  several years later in life. This risk is typically less than 0.5% percent. This risk is low in comparison to the normal  incidence of human cancer, which is 33% for women and 50% for men according to the [place]. - Radiation induced injury can include skin redness, resembling a rash, tissue breakdown / ulcers and hair loss (which can be temporary or permanent).  The likelihood of either of these occurring depends on the difficulty of the procedure and whether you are sensitive to radiation due to previous procedures, disease, or genetic conditions.  IF your procedure requires a prolonged use of radiation, you will be notified and given written instructions for further action.  It is your responsibility to monitor the irradiated area for the 2 weeks following the procedure and to notify your physician if you are concerned that you have suffered a radiation  induced injury.    All of the patient's questions were answered, patient is agreeable to proceed.  Consent signed and in chart.  Thank you for this interesting consult.  I greatly enjoyed meeting [name] and look forward to participating in their care.  A copy of this report was sent to the requesting provider on this date.  Electronically Signed: Joaquim Nam, PA-C 08/09/2018, 7:36 AM   I spent a total of  25 Minutes in face to face in clinical consultation, greater than 50% of which was counseling/coordinating care for diagnostic cerebral angiogram.

## 2018-08-09 NOTE — Progress Notes (Signed)
Patient was scheduled for an image-guided diagnostic cerebral angiogram this AM with Dr. Estanislado Pandy.  Unfortunately, there was an emergent code stroke that required Dr. Arlean Hopping immediate assistance this AM. Because of this, patient's procedure will not occur today and will be postponed to Tuesday 08/16/2018 at 1000 (arrive at 0830). Pre-procedure handout given to patient. Instructed patient last dose of Eliquis Saturday 08/14/2018. Instructed patient to hold all diabetic medications on day of procedure.  All questions answered and concerns addressed. Patient and wife convey understanding and agree with plan.  Bea Graff Millee Denise, PA-C 08/09/2018, 11:13 AM

## 2018-08-09 NOTE — Discharge Instructions (Signed)
Moderate Conscious Sedation, Adult, Care After °These instructions provide you with information about caring for yourself after your procedure. Your health care provider may also give you more specific instructions. Your treatment has been planned according to current medical practices, but problems sometimes occur. Call your health care provider if you have any problems or questions after your procedure. °What can I expect after the procedure? °After your procedure, it is common: °· To feel sleepy for several hours. °· To feel clumsy and have poor balance for several hours. °· To have poor judgment for several hours. °· To vomit if you eat too soon. °Follow these instructions at home: °For at least 24 hours after the procedure: ° °· Do not: °? Participate in activities where you could fall or become injured. °? Drive. °? Use heavy machinery. °? Drink alcohol. °? Take sleeping pills or medicines that cause drowsiness. °? Make important decisions or sign legal documents. °? Take care of children on your own. °· Rest. °Eating and drinking °· Follow the diet recommended by your health care provider. °· If you vomit: °? Drink water, juice, or soup when you can drink without vomiting. °? Make sure you have little or no nausea before eating solid foods. °General instructions °· Have a responsible adult stay with you until you are awake and alert. °· Take over-the-counter and prescription medicines only as told by your health care provider. °· If you smoke, do not smoke without supervision. °· Keep all follow-up visits as told by your health care provider. This is important. °Contact a health care provider if: °· You keep feeling nauseous or you keep vomiting. °· You feel light-headed. °· You develop a rash. °· You have a fever. °Get help right away if: °· You have trouble breathing. °This information is not intended to replace advice given to you by your health care provider. Make sure you discuss any questions you have  with your health care provider. °Document Released: 04/26/2013 Document Revised: 12/09/2015 Document Reviewed: 10/26/2015 °Elsevier Interactive Patient Education © 2019 [employer] Inc[employer] °Angiogram, Care After °This sheet gives you information about how to care for yourself after your procedure. Your doctor may also give you more specific instructions. If you have problems or questions, contact your doctor. °Follow these instructions at home: °Insertion site care °· Follow instructions from your doctor about how to take care of your long, thin tube (catheter) insertion area. Make sure you: °? Wash your hands with soap and water before you change your bandage (dressing). If you cannot use soap and water, use hand sanitizer. °? Change your bandage as told by your doctor. °? Leave stitches (sutures), skin glue, or skin tape (adhesive) strips in place. They may need to stay in place for 2 weeks or longer. If tape strips get loose and curl up, you may trim the loose edges. Do not remove tape strips completely unless your doctor says it is okay. °· Do not take baths, swim, or use a hot tub until your doctor says it is okay. °· You may shower 24-48 hours after the procedure or as told by your doctor. °? Gently wash the area with plain soap and water. °? Pat the area dry with a clean towel. °? Do not rub the area. This may cause bleeding. °· Do not apply powder or lotion to the area. Keep the area clean and dry. °· Check your insertion area every day for signs of infection. Check for: °? More redness, swelling, or pain. °?   Fluid or blood. °? Warmth. °? Pus or a bad smell. °Activity °· Rest as told by your doctor, usually for 1-2 days. °· Do not lift anything that is heavier than 10 lbs. (4.5 kg) or as told by your doctor. °· Do not drive for 24 hours if you were given a medicine to help you relax (sedative). °· Do not drive or use heavy machinery while taking prescription pain medicine. °General instructions ° °· Go back to  your normal activities as told by your doctor, usually in about a week. Ask your doctor what activities are safe for you. °· If the insertion area starts to bleed, lie flat and put pressure on the area. If the bleeding does not stop, get help right away. This is an emergency. °· Drink enough fluid to keep your pee (urine) clear or pale yellow. °· Take over-the-counter and prescription medicines only as told by your doctor. °· Keep all follow-up visits as told by your doctor. This is important. °Contact a doctor if: °· You have a fever. °· You have chills. °· You have more redness, swelling, or pain around your insertion area. °· You have fluid or blood coming from your insertion area. °· The insertion area feels warm to the touch. °· You have pus or a bad smell coming from your insertion area. °· You have more bruising around the insertion area. °· Blood collects in the tissue around the insertion area (hematoma) that may be painful to the touch. °Get help right away if: °· You have a lot of pain in the insertion area. °· The insertion area swells very fast. °· The insertion area is bleeding, and the bleeding does not stop after holding steady pressure on the area. °· The area near or just beyond the insertion area becomes pale, cool, tingly, or numb. °These symptoms may be an emergency. Do not wait to see if the symptoms will go away. Get medical help right away. Call your local emergency services (911 in the [place] Do not drive yourself to the hospital. °Summary °· After the procedure, it is common to have bruising and tenderness at the long, thin tube insertion area. °· After the procedure, it is important to rest and drink plenty of fluids. °· Do not take baths, swim, or use a hot tub until your doctor says it is okay to do so. You may shower 24-48 hours after the procedure or as told by your doctor. °· If the insertion area starts to bleed, lie flat and put pressure on the area. If the bleeding does not stop,  get help right away. This is an emergency. °This information is not intended to replace advice given to you by your health care provider. Make sure you discuss any questions you have with your health care provider. °Document Released: 10/02/2008 Document Revised: 06/30/2016 Document Reviewed: 06/30/2016 °Elsevier Interactive Patient Education © 2019 Elsevier Inc. ° °

## 2018-08-10 ENCOUNTER — Telehealth: Payer: Self-pay | Admitting: Cardiovascular Disease

## 2018-08-10 NOTE — Telephone Encounter (Signed)
Returned call to patient he stated for the past 2 weeks he has been sob and swelling in both feet.Stated hard to get shoes on.Stated he started Jardiance 4 weeks ago.Blood sugars have been good.He thought Jardiance may be causing sob.Offered patient appointment today, but he stated he cannot come today he has appointment at [facility] for diabetic check up.Appointment scheduled with Almyra Deforest PA 08/12/18 at 11:00 am.Advised to bring a list of all medications.Advised to go to ED if sob becomes worse.

## 2018-08-10 NOTE — Telephone Encounter (Signed)
New message    Pt c/o swelling: STAT is pt has developed SOB within 24 hours  1) How much weight have you gained and in what time span? 10lbs in less than a month (not sure)  2) If swelling, where is the swelling located? Feet, belly, everywhere   3) Are you currently taking a fluid pill? no  4) Are you currently SOB? Yes   5) Do you have a log of your daily weights (if so, list)? no  6) Have you gained 3 pounds in a day or 5 pounds in a week? Not sure   7) Have you traveled recently? no  Pt stated that hes taking   empagliflozin (JARDIANCE) 10 MG TABS tablet and thinks that might be it

## 2018-08-11 DIAGNOSIS — G4733 Obstructive sleep apnea (adult) (pediatric): Secondary | ICD-10-CM | POA: Diagnosis not present

## 2018-08-12 ENCOUNTER — Ambulatory Visit: Payer: Medicare HMO | Admitting: Physician Assistant

## 2018-08-12 VITALS — BP 104/70 | HR 52 | Ht 70.0 in | Wt 260.6 lb

## 2018-08-12 DIAGNOSIS — I5043 Acute on chronic combined systolic (congestive) and diastolic (congestive) heart failure: Secondary | ICD-10-CM | POA: Diagnosis not present

## 2018-08-12 DIAGNOSIS — I2510 Atherosclerotic heart disease of native coronary artery without angina pectoris: Secondary | ICD-10-CM

## 2018-08-12 DIAGNOSIS — G4733 Obstructive sleep apnea (adult) (pediatric): Secondary | ICD-10-CM | POA: Diagnosis not present

## 2018-08-12 DIAGNOSIS — I482 Chronic atrial fibrillation, unspecified: Secondary | ICD-10-CM

## 2018-08-12 DIAGNOSIS — E119 Type 2 diabetes mellitus without complications: Secondary | ICD-10-CM

## 2018-08-12 DIAGNOSIS — Z79899 Other long term (current) drug therapy: Secondary | ICD-10-CM

## 2018-08-12 DIAGNOSIS — R0602 Shortness of breath: Secondary | ICD-10-CM | POA: Diagnosis not present

## 2018-08-12 DIAGNOSIS — I739 Peripheral vascular disease, unspecified: Secondary | ICD-10-CM | POA: Diagnosis not present

## 2018-08-12 DIAGNOSIS — E785 Hyperlipidemia, unspecified: Secondary | ICD-10-CM

## 2018-08-12 DIAGNOSIS — I10 Essential (primary) hypertension: Secondary | ICD-10-CM | POA: Diagnosis not present

## 2018-08-12 DIAGNOSIS — Z9989 Dependence on other enabling machines and devices: Secondary | ICD-10-CM

## 2018-08-12 MED ORDER — LISINOPRIL 20 MG PO TABS: 20.0000 mg | ORAL_TABLET | Freq: Every day | ORAL | 6 refills | Status: DC

## 2018-08-12 MED ORDER — FUROSEMIDE 20 MG PO TABS: 20.0000 mg | ORAL_TABLET | Freq: Every day | ORAL | 6 refills | Status: DC

## 2018-08-12 NOTE — Progress Notes (Signed)
Cardiology Office Note    Date:  08/14/2018   ID:  [name], DOB 1956/04/19, MRN [id number]  PCP:  Patient, No Pcp Per  Cardiologist:  Dr. Gwenlyn Found  Chief Complaint  Patient presents with  . Follow-up    seen for Dr. Gwenlyn Found.     History of Present Illness:  [name] is a 63 y.o. male with PMH of CAD, hypertension, hyperlipidemia followed at [facility], history of DVT, chronic atrial fibrillation on eliquis, DM 2, PAD, OSA on CPAP followed by neurology and CVA.  Patient had history of CAD dating back to 2002 and underwent back CABG x4 at the time.  He also has peripheral neuropathy and peripheral arterial disease.  He had history of left lower extremity stent in the past.  He required above to below-knee bypass graft by Dr. Oneida Alar due to restenosis in the lower extremity artery.  Myoview obtained on 02/07/2015 showed EF 52%, overall low risk study.  In 2017, it was noted that his left femoral-popliteal bypass graft has occluded by angiography.  He underwent redo composite vein left fem-tibial bypass graft.  Due to recurrent stroke, he was diagnosed with atrial fibrillation and was started on Eliquis.  A repeat echocardiogram on 01/31/2016 showed EF 45 to 50%, mild LVH.  His last office visit with Dr. Gwenlyn Found was on 04/02/2017, at which time he was doing well.  Patient presents today for cardiology office visit, he complains that for the past week, he has been having increasing dyspnea and has gained 10 pounds.  He also noticed lower extremity and ankle edema.  On physical exam, he has 2+ pitting edema in the ankle area.  He denies any recent exertional chest pain.  He also feels mild abdominal distention as well.  I plan to discontinue his lisinopril-hydrochlorothiazide combination and start him on lisinopril 20 mg daily by itself.  I also wanted to start him on Lasix 20 mg daily.  He will need 1 week basic metabolic panel.  He will obtain echocardiogram before follow-up with me in 3 weeks for  reassessment.   Past Medical History:  Diagnosis Date  . Arthritis    "hx; cleaned it out of both shoulders"  . CAD (coronary artery disease)    OV, Dr Harlow Asa, MYOVIEW 5/12 on chart  EKG 10/12 EPIC,  chest x ray 01/07/11 EPIC  . Carpal tunnel syndrome    peripheral neuropathy  . Chronic shoulder pain    "both"  . DVT (deep venous thrombosis) (HCC)    hx LLE  . History of kidney stones   . Hyperlipidemia   . Hypertension   . Myocardial infarction ([place]) 02/2001  . Neuropathy, peripheral    both feet  . Peripheral vascular disease, unspecified ([place]) 03/2015   PCI to the right popliteal  . Pseudobulbar affect   . Skin cancer    "have had them cut or burned off my face" (03/28/2015)  . Stroke ([place]) 10/10/2015  . Type II diabetes mellitus ([place])     Past Surgical History:  Procedure Laterality Date  . APPENDECTOMY  1977  . CARDIAC CATHETERIZATION  2002      . CARPAL TUNNEL RELEASE Right 2000's  . CARPAL TUNNEL RELEASE  12/18/2011   Procedure: CARPAL TUNNEL RELEASE;  Surgeon: Mcarthur Rossetti, MD;  Location: [facility];  Service: Orthopedics;  Laterality: Left;  Left Open Carpal Tunnel Release  . CORONARY ANGIOPLASTY    . CORONARY ARTERY BYPASS GRAFT  2002   CABG  X 4  . CYSTOSCOPY  several done in past  . FEMORAL-TIBIAL BYPASS GRAFT Left 01/07/11   fem-posterior tibial BPG using reversed left GSV               12/15/11 OK BY DR Ninfa Linden TO CONTINUE ASA AND PLAVIX  . FEMORAL-TIBIAL BYPASS GRAFT Left 09/06/2015   Procedure: LEFT FEMORAL-POSTERIOR TIBIAL ARTERY BYPASS GRAFT WITH COMPOSITE PTFE AND RIGHT ARM VEIN;  Surgeon: Elam Dutch, MD;  Location: [place];  Service: Vascular;  Laterality: Left;  . FEMOROPOPLITEAL THROMBECTOMY / EMBOLECTOMY  ~ 2010  . FRACTURE SURGERY    . IR ANGIO INTRA EXTRACRAN SEL COM CAROTID INNOMINATE BILAT MOD SED  03/02/2017  . IR ANGIO VERTEBRAL SEL VERTEBRAL UNI L MOD SED  03/02/2017  . IR ANGIOGRAM EXTREMITY RIGHT  03/02/2017  . IR GENERIC  HISTORICAL  02/18/2016   IR RADIOLOGIST EVAL & MGMT 02/18/2016 [facility]  . IR GENERIC HISTORICAL  07/30/2016   IR ANGIO VERTEBRAL SEL VERTEBRAL UNI L MOD SED 07/30/2016 Luanne Bras, MD [facility]  . IR GENERIC HISTORICAL  07/30/2016   IR ANGIO INTRA EXTRACRAN SEL COM CAROTID INNOMINATE BILAT MOD SED 07/30/2016 Luanne Bras, MD [facility]  . KNEE ARTHROSCOPY Left X 2  . LAPAROSCOPIC CHOLECYSTECTOMY  1990's  . LITHOTRIPSY  several done in past  . ORIF RADIUS & ULNA FRACTURES Left   . PERIPHERAL VASCULAR CATHETERIZATION N/A 03/28/2015   Procedure: Lower Extremity Angiography;  Surgeon: Lorretta Harp, MD;  Location: [place];  Service: Cardiovascular;  Laterality: N/A;  . PERIPHERAL VASCULAR CATHETERIZATION Right 03/28/2015   Procedure: Peripheral Vascular Atherectomy;  Surgeon: Lorretta Harp, MD;  Location: [place] [facility];  Service: Cardiovascular;  Laterality: Right;  popliteal;   . PERIPHERAL VASCULAR CATHETERIZATION N/A 08/23/2015   Procedure: Abdominal Aortogram;  Surgeon: Elam Dutch, MD;  Location: [place] [facility];  Service: Cardiovascular;  Laterality: N/A;  . POPLITEAL ARTERY STENT Left 2010-2012 X 4  . RADIOLOGY WITH ANESTHESIA N/A 02/04/2016   Procedure: Basilar artery angioplasty with stenting;  Surgeon: Luanne Bras, MD;  Location: [facility];  Service: Radiology;  Laterality: N/A;  . SHOULDER ARTHROSCOPY Left   . SHOULDER ARTHROSCOPY Right 12/18/2011  . SHOULDER ARTHROSCOPY  07/08/2012   Procedure: ARTHROSCOPY SHOULDER;  Surgeon: Mcarthur Rossetti, MD;  Location: [facility];  Service: Orthopedics;  Laterality: Left;  Left Shoulder Arthroscopy with Manipulation and Extensive Debridement  . SHOULDER ARTHROSCOPY WITH ROTATOR CUFF REPAIR Left 07/14/2013   Procedure: LEFT SHOULDER ARTHROSCOPY WITH EXTENSIVE DEBRIDEMENT, DISTAL CLAVICLE REPAIR;  Surgeon: Mcarthur Rossetti, MD;  Location: [facility];  Service: Orthopedics;  Laterality: Left;  . SKIN  CANCER EXCISION     "left side of my forehead"  . VEIN HARVEST Right 09/06/2015   Procedure: RIGHT ARM VEIN HARVEST;  Surgeon: Elam Dutch, MD;  Location: [facility];  Service: Vascular;  Laterality: Right;    Current Medications: Outpatient Medications Prior to Visit  Medication Sig Dispense Refill  . atorvastatin (LIPITOR) 40 MG tablet Take 40 mg by mouth daily.    . clopidogrel (PLAVIX) 75 MG tablet TAKE 1 TABLET BY MOUTH DAILY 90 tablet 0  . ELIQUIS 5 MG TABS tablet TAKE 1 TABLET BY MOUTH TWICE A DAY 180 tablet 1  . empagliflozin (JARDIANCE) 10 MG TABS tablet Take 10 mg by mouth daily.    . fenofibrate (TRICOR) 145 MG tablet Take 1 tablet (145 mg total) by mouth daily. Please schedule  appointment for refills. 90 tablet 0  . gabapentin (NEURONTIN) 300 MG capsule Take 300 mg by mouth 3 (three) times daily.     . insulin NPH-regular Human (70-30) 100 UNIT/ML injection Inject 25-75 Units into the skin See admin instructions. 75 units with breakfast and dinner, and 25 units at lunch    . metFORMIN (GLUCOPHAGE-XR) 500 MG 24 hr tablet Take 1 tablet (500 mg total) by mouth 2 (two) times daily.    . metoprolol succinate (TOPROL-XL) 25 MG 24 hr tablet TAKE 1 TABLET (25 MG TOTAL) BY MOUTH DAILY. *DUE 9/17* 90 tablet 1  . naproxen sodium (ALEVE) 220 MG tablet Take 220 mg by mouth daily.    . niacin 500 MG tablet Take 500 mg by mouth daily after supper.    [name] lisinopril-hydrochlorothiazide (PRINZIDE,ZESTORETIC) 20-12.5 MG per tablet Take 1 tablet by mouth daily with breakfast.      No facility-administered medications prior to visit.      Allergies:   Patient has no known allergies.   Social History   Socioeconomic History  . Marital status: Widowed    Spouse name: Not on file  . Number of children: Not on file  . Years of education: Not on file  . Highest education level: Not on file  Occupational History  . Not on file  Social Needs  . Financial resource strain: Not on file  . Food  insecurity:    Worry: Not on file    Inability: Not on file  . Transportation needs:    Medical: Not on file    Non-medical: Not on file  Tobacco Use  . Smoking status: Never Smoker  . Smokeless tobacco: Never Used  Substance and Sexual Activity  . Alcohol use: No  . Drug use: No  . Sexual activity: Yes  Lifestyle  . Physical activity:    Days per week: Not on file    Minutes per session: Not on file  . Stress: Not on file  Relationships  . Social connections:    Talks on phone: Not on file    Gets together: Not on file    Attends religious service: Not on file    Active member of club or organization: Not on file    Attends meetings of clubs or organizations: Not on file    Relationship status: Not on file  Other Topics Concern  . Not on file  Social History Narrative  . Not on file     Family History:  The patient's family history includes Cancer in his father and mother.   ROS:   Please see the history of present illness.    ROS All other systems reviewed and are negative.   PHYSICAL EXAM:   VS:  BP 104/70   Pulse (!) 52   Ht 5\' 10"  (1.778 m)   Wt 260 lb 9.6 oz (118.2 kg)   BMI 37.39 kg/m    GEN: Well nourished, well developed, in no acute distress  HEENT: normal  Neck: no JVD, carotid bruits, or masses Cardiac: Irregularly irregular; no murmurs, rubs, or gallops,no edema  Respiratory:  clear to auscultation bilaterally, normal work of breathing GI: soft, nontender, nondistended, + BS MS: no deformity or atrophy  Skin: warm and dry, no rash Neuro:  Alert and Oriented x 3, Strength and sensation are intact Psych: euthymic mood, full affect  Wt Readings from Last 3 Encounters:  08/12/18 260 lb 9.6 oz (118.2 kg)  08/09/18 260 lb (117.9 kg)  04/26/18 253 lb 9.6 oz (115 kg)      Studies/Labs Reviewed:   EKG:  EKG is ordered today.  The ekg ordered today demonstrates atrial fibrillation, heart rate 52  Recent Labs: 08/09/2018: BUN 21; Creatinine, Ser  1.40; Hemoglobin 13.2; Platelets 263; Potassium 3.9; Sodium 139   Lipid Panel    Component Value Date/Time   CHOL 523 (H) 01/29/2016 0538   TRIG 1,925 (H) 01/29/2016 0538   HDL NOT REPORTED DUE TO HIGH TRIGLYCERIDES 01/29/2016 0538   CHOLHDL NOT REPORTED DUE TO HIGH TRIGLYCERIDES 01/29/2016 0538   VLDL UNABLE TO CALCULATE IF TRIGLYCERIDE OVER 400 mg/dL 01/29/2016 0538   LDLCALC UNABLE TO CALCULATE IF TRIGLYCERIDE OVER 400 mg/dL 01/29/2016 0538   LDLDIRECT 87 01/16/2015 0813    Additional studies/ records that were reviewed today include:   Myoview 02/07/2015 Study Highlights    Nuclear stress EF: 52%.  The left ventricular ejection fraction is mildly decreased (45-54%).  The study is normal.  This is a low risk study.   Nl myoview    Echo 01/31/2016 LV EF: 45% -   50% Study Conclusions  - Procedure narrative: Transthoracic echocardiography. Image   quality was suboptimal. The study was technically difficult, as a   result of poor acoustic windows, poor sound wave transmission,   and body habitus. Intravenous contrast (Definity) was   administered. - Left ventricle: The cavity size was normal. Wall thickness was   increased in a pattern of mild LVH. Systolic function was mildly   reduced. The estimated ejection fraction was in the range of 45%   to 50%.    ASSESSMENT:    1. Acute on chronic combined systolic and diastolic CHF (congestive heart failure) ([place])   2. Medication management   3. SOB (shortness of breath)   4. Coronary artery disease involving native coronary artery of native heart without angina pectoris   5. Essential hypertension   6. Hyperlipidemia LDL goal <70   7. Chronic atrial fibrillation   8. Controlled type 2 diabetes mellitus without complication, without long-term current use of insulin ([place])   9. OSA on CPAP   10. PAD (peripheral artery disease) (HCC)      PLAN:  In order of problems listed above:  1. Acute on chronic systolic  heart failure: Previous EF was 45 to 50% on last echocardiogram in 2017.  Lower extremity edema occurred recently.  He denies any recent chest discomfort.  I will discontinue his lisinopril-hydrochlorothiazide and start him on lisinopril 20 mg daily by itself.  I will also started him on Lasix 20 mg daily.  He will need basic metabolic panel in 1 week.  I will arrange repeat echocardiogram to make sure his ejection fraction is not getting worse.  If it does, he will need ischemic work-up.  2. CAD: Denies any recent chest discomfort.  Not on aspirin given the need for Eliquis  3. Chronic atrial fibrillation: Rate controlled.  Continue Eliquis 5 mg twice daily and Toprol-XL  4. Hypertension: Blood pressure stable on current therapy  5. DM 2: Managed by primary care provider.  On insulin  6. Hyperlipidemia: On Lipitor 40 mg daily  7. PAD: Followed by vascular surgery.  Previous lower extremity bypass    Medication Adjustments/Labs and Tests Ordered: Current medicines are reviewed at length with the patient today.  Concerns regarding medicines are outlined above.  Medication changes, Labs and Tests ordered today are listed in the Patient Instructions below. Patient Instructions  Medication Instructions:  STOP LISINOPRIL-HCTZ  START LISINOPRIL 20MG  DAILY START LASIX 20MG  DAILY If you need a refill on your cardiac medications before your next appointment, please call your pharmacy.  Labwork: BMET IN 1 WEEK HERE IN OUR OFFICE AT [facility]   You will NOT need to fast   Take the provided lab slips with you to the lab for your blood draw.   When you have your labs (blood work) drawn today and your tests are completely normal, you will receive your results only by MyChart Message (if you have MyChart) -OR-  A paper copy in the mail.  If you have any lab test that is abnormal or we need to change your treatment, we will call you to review these results.  Testing/Procedures: Echocardiogram - Your  physician has requested that you have an echocardiogram. Echocardiography is a painless test that uses sound waves to create images of your heart. It provides your doctor with information about the size and shape of your heart and how well your heart's chambers and valves are working. This procedure takes approximately one hour. There are no restrictions for this procedure. This will be performed at our [facility] location - [address].  Follow-Up: You will need a follow up appointment in 3 weeks-THIS SHOULD BE AFTER ECHO.  You may see Quay Burow, MD  [facility], PA-C or one of the following Advanced Practice Providers on your designated Care Team:  Kerin Ransom, [place]  Roby Lofts, PA-C  Sande Rives, [place]   At [facility], you and your health needs are our priority.  As part of our continuing mission to provide you with exceptional heart care, we have created designated Provider Care Teams.  These Care Teams include your primary Cardiologist (physician) and Advanced Practice Providers (APPs -  Physician Assistants and Nurse Practitioners) who all work together to provide you with the care you need, when you need it.  Thank you for choosing [facility] at [facility] [employer], [place]  08/14/2018 11:38 PM    [address], [place], [place]  [address] Phone: [phone]; Fax: [phone]

## 2018-08-12 NOTE — Patient Instructions (Signed)
Medication Instructions:  STOP LISINOPRIL-HCTZ  START LISINOPRIL 20MG  DAILY START LASIX 20MG  DAILY If you need a refill on your cardiac medications before your next appointment, please call your pharmacy.  Labwork: BMET IN 1 WEEK HERE IN OUR OFFICE AT [facility]   You will NOT need to fast   Take the provided lab slips with you to the lab for your blood draw.   When you have your labs (blood work) drawn today and your tests are completely normal, you will receive your results only by MyChart Message (if you have MyChart) -OR-  A paper copy in the mail.  If you have any lab test that is abnormal or we need to change your treatment, we will call you to review these results.  Testing/Procedures: Echocardiogram - Your physician has requested that you have an echocardiogram. Echocardiography is a painless test that uses sound waves to create images of your heart. It provides your doctor with information about the size and shape of your heart and how well your heart's chambers and valves are working. This procedure takes approximately one hour. There are no restrictions for this procedure. This will be performed at our [facility] location - [address], [address].  Follow-Up: You will need a follow up appointment in 3 weeks-THIS SHOULD BE AFTER ECHO.  You may see Quay Burow, MD  [facility], PA-C or one of the following Advanced Practice Providers on your designated Care Team:  Kerin Ransom, [place]  Roby Lofts, PA-C  Sande Rives, [place]   At [facility], you and your health needs are our priority.  As part of our continuing mission to provide you with exceptional heart care, we have created designated Provider Care Teams.  These Care Teams include your primary Cardiologist (physician) and Advanced Practice Providers (APPs -  Physician Assistants and Nurse Practitioners) who all work together to provide you with the care you need, when you need it.  Thank you for choosing [facility] at  [facility]!!

## 2018-08-14 ENCOUNTER — Encounter: Payer: Self-pay | Admitting: Physician Assistant

## 2018-08-15 ENCOUNTER — Other Ambulatory Visit: Payer: Self-pay | Admitting: Radiology

## 2018-08-16 ENCOUNTER — Ambulatory Visit: Payer: Medicare HMO | Admitting: Adult Health

## 2018-08-16 ENCOUNTER — Other Ambulatory Visit (HOSPITAL_COMMUNITY): Payer: Self-pay | Admitting: Interventional Radiology

## 2018-08-16 ENCOUNTER — Encounter (HOSPITAL_COMMUNITY): Payer: Self-pay

## 2018-08-16 ENCOUNTER — Ambulatory Visit (HOSPITAL_COMMUNITY)
Admission: RE | Admit: 2018-08-16 | Discharge: 2018-08-16 | Disposition: A | Discharge status: Home / Self Care | Payer: Medicare HMO | Source: Ambulatory Visit | Attending: Interventional Radiology | Admitting: Interventional Radiology

## 2018-08-16 ENCOUNTER — Other Ambulatory Visit: Payer: Self-pay

## 2018-08-16 DIAGNOSIS — G629 Polyneuropathy, unspecified: Secondary | ICD-10-CM | POA: Insufficient documentation

## 2018-08-16 DIAGNOSIS — I739 Peripheral vascular disease, unspecified: Secondary | ICD-10-CM | POA: Insufficient documentation

## 2018-08-16 DIAGNOSIS — I10 Essential (primary) hypertension: Secondary | ICD-10-CM | POA: Insufficient documentation

## 2018-08-16 DIAGNOSIS — I771 Stricture of artery: Secondary | ICD-10-CM

## 2018-08-16 DIAGNOSIS — Z7901 Long term (current) use of anticoagulants: Secondary | ICD-10-CM | POA: Insufficient documentation

## 2018-08-16 DIAGNOSIS — I2510 Atherosclerotic heart disease of native coronary artery without angina pectoris: Secondary | ICD-10-CM | POA: Diagnosis not present

## 2018-08-16 DIAGNOSIS — Z794 Long term (current) use of insulin: Secondary | ICD-10-CM | POA: Diagnosis not present

## 2018-08-16 DIAGNOSIS — Z79899 Other long term (current) drug therapy: Secondary | ICD-10-CM | POA: Diagnosis not present

## 2018-08-16 DIAGNOSIS — E785 Hyperlipidemia, unspecified: Secondary | ICD-10-CM | POA: Insufficient documentation

## 2018-08-16 DIAGNOSIS — Z8673 Personal history of transient ischemic attack (TIA), and cerebral infarction without residual deficits: Secondary | ICD-10-CM | POA: Diagnosis not present

## 2018-08-16 DIAGNOSIS — I252 Old myocardial infarction: Secondary | ICD-10-CM | POA: Diagnosis not present

## 2018-08-16 DIAGNOSIS — Z86718 Personal history of other venous thrombosis and embolism: Secondary | ICD-10-CM | POA: Insufficient documentation

## 2018-08-16 DIAGNOSIS — I6601 Occlusion and stenosis of right middle cerebral artery: Secondary | ICD-10-CM | POA: Diagnosis not present

## 2018-08-16 DIAGNOSIS — E119 Type 2 diabetes mellitus without complications: Secondary | ICD-10-CM | POA: Insufficient documentation

## 2018-08-16 HISTORY — PX: IR US GUIDE VASC ACCESS RIGHT: IMG2390

## 2018-08-16 HISTORY — PX: IR ANGIO VERTEBRAL SEL SUBCLAVIAN INNOMINATE UNI R MOD SED: IMG5365

## 2018-08-16 HISTORY — PX: IR ANGIO VERTEBRAL SEL VERTEBRAL UNI L MOD SED: IMG5367

## 2018-08-16 LAB — GLUCOSE, CAPILLARY
Glucose-Capillary: 151 mg/dL — ABNORMAL HIGH (ref 70–99)
Glucose-Capillary: 115 mg/dL — ABNORMAL HIGH (ref 70–99)

## 2018-08-16 LAB — BASIC METABOLIC PANEL
Anion gap: 8 (ref 5–15)
BUN: 20 mg/dL (ref 8–23)
CO2: 25 mmol/L (ref 22–32)
Calcium: 9.5 mg/dL (ref 8.9–10.3)
Chloride: 110 mmol/L (ref 98–111)
Creatinine, Ser: 1.42 mg/dL — ABNORMAL HIGH (ref 0.61–1.24)
GFR calc Af Amer: >60 mL/min (ref >60)
GFR calc non Af Amer: 52 mL/min — ABNORMAL LOW (ref >60)
Glucose, Bld: 171 mg/dL — ABNORMAL HIGH (ref 70–99)
Potassium: 4.5 mmol/L (ref 3.5–5.1)
Sodium: 143 mmol/L (ref 135–145)

## 2018-08-16 MED ORDER — MIDAZOLAM HCL 2 MG/2ML IJ SOLN
INTRAMUSCULAR | Status: AC | PRN
  Administered 2018-08-16 (×2): 1 mg via INTRAVENOUS

## 2018-08-16 MED ORDER — SODIUM CHLORIDE 0.9 % IV SOLN: INTRAVENOUS | Status: AC

## 2018-08-16 MED ORDER — VERAPAMIL HCL 2.5 MG/ML IV SOLN
INTRAVENOUS | Status: AC | PRN
  Administered 2018-08-16: 2.5 mg via INTRA_ARTERIAL

## 2018-08-16 MED ORDER — MIDAZOLAM HCL 2 MG/2ML IJ SOLN
INTRAMUSCULAR | Status: AC
  Filled 2018-08-16: qty 2

## 2018-08-16 MED ORDER — IOHEXOL 300 MG/ML  SOLN
150.0000 mL | Freq: Once | INTRAMUSCULAR | Status: AC | PRN
  Administered 2018-08-16: 90 mL via INTRA_ARTERIAL

## 2018-08-16 MED ORDER — LIDOCAINE HCL 1 % IJ SOLN
INTRAMUSCULAR | Status: AC
  Filled 2018-08-16: qty 20

## 2018-08-16 MED ORDER — HYDRALAZINE HCL 20 MG/ML IJ SOLN
INTRAMUSCULAR | Status: AC | PRN
  Administered 2018-08-16 (×2): 5 mg via INTRAVENOUS

## 2018-08-16 MED ORDER — FENTANYL CITRATE (PF) 100 MCG/2ML IJ SOLN
INTRAMUSCULAR | Status: AC
  Filled 2018-08-16: qty 2

## 2018-08-16 MED ORDER — HEPARIN SODIUM (PORCINE) 1000 UNIT/ML IJ SOLN
INTRAMUSCULAR | Status: AC | PRN
  Administered 2018-08-16: 2000 [IU] via INTRA_ARTERIAL

## 2018-08-16 MED ORDER — FENTANYL CITRATE (PF) 100 MCG/2ML IJ SOLN
INTRAMUSCULAR | Status: AC | PRN
  Administered 2018-08-16 (×3): 25 ug via INTRAVENOUS

## 2018-08-16 MED ORDER — NITROGLYCERIN 1 MG/10 ML FOR IR/CATH LAB
INTRA_ARTERIAL | Status: AC | PRN
  Administered 2018-08-16 (×2): 200 ug via INTRA_ARTERIAL

## 2018-08-16 MED ORDER — SODIUM CHLORIDE 0.9 % IV SOLN
INTRAVENOUS | Status: DC
  Administered 2018-08-16: 09:00:00 via INTRAVENOUS

## 2018-08-16 MED ORDER — HYDRALAZINE HCL 20 MG/ML IJ SOLN
INTRAMUSCULAR | Status: AC
  Filled 2018-08-16: qty 1

## 2018-08-16 MED ORDER — HEPARIN SODIUM (PORCINE) 1000 UNIT/ML IJ SOLN
INTRAMUSCULAR | Status: AC
  Filled 2018-08-16: qty 1

## 2018-08-16 MED ORDER — NITROGLYCERIN 1 MG/10 ML FOR IR/CATH LAB
INTRA_ARTERIAL | Status: AC
  Filled 2018-08-16: qty 10

## 2018-08-16 MED ORDER — VERAPAMIL HCL 2.5 MG/ML IV SOLN
INTRAVENOUS | Status: AC
  Filled 2018-08-16: qty 2

## 2018-08-16 MED ORDER — SODIUM CHLORIDE 0.9 % IV SOLN
INTRAVENOUS | Status: AC | PRN
  Administered 2018-08-16: 75 mL/h via INTRAVENOUS

## 2018-08-16 NOTE — Progress Notes (Signed)
Spoke with Jannifer [place], PA about blood work states to send blood for [facility] only.

## 2018-08-16 NOTE — Discharge Instructions (Signed)
Femoral Site Care  This sheet gives you information about how to care for yourself after your procedure. Your health care provider may also give you more specific instructions. If you have problems or questions, contact your health care provider.  What can I expect after the procedure?  After the procedure, it is common to have:   Bruising that usually fades within 1-2 weeks.   Tenderness at the site.  Follow these instructions at home:  Wound care   Follow instructions from your health care provider about how to take care of your insertion site. Make sure you:  ? Wash your hands with soap and water before you change your bandage (dressing). If soap and water are not available, use hand sanitizer.  ? Change your dressing as told by your health care provider.  ? Leave stitches (sutures), skin glue, or adhesive strips in place. These skin closures may need to stay in place for 2 weeks or longer. If adhesive strip edges start to loosen and curl up, you may trim the loose edges. Do not remove adhesive strips completely unless your health care provider tells you to do that.   Do not take baths, swim, or use a hot tub until your health care provider approves.   You may shower 24-48 hours after the procedure or as told by your health care provider.  ? Gently wash the site with plain soap and water.  ? Pat the area dry with a clean towel.  ? Do not rub the site. This may cause bleeding.   Do not apply powder or lotion to the site. Keep the site clean and dry.   Check your femoral site every day for signs of infection. Check for:  ? Redness, swelling, or pain.  ? Fluid or blood.  ? Warmth.  ? Pus or a bad smell.  Activity   For the first 2-3 days after your procedure, or as long as directed:  ? Avoid climbing stairs as much as possible.  ? Do not squat.   Do not lift anything that is heavier than 10 lb (4.5 kg), or the limit that you are told, until your health care provider says that it is safe.   Rest as  directed.  ? Avoid sitting for a long time without moving. Get up to take short walks every 1-2 hours.   Do not drive for 24 hours if you were given a medicine to help you relax (sedative).  General instructions   Take over-the-counter and prescription medicines only as told by your health care provider.   Keep all follow-up visits as told by your health care provider. This is important.  Contact a health care provider if you have:   A fever or chills.   You have redness, swelling, or pain around your insertion site.  Get help right away if:   The catheter insertion area swells very fast.   You pass out.   You suddenly start to sweat or your skin gets clammy.   The catheter insertion area is bleeding, and the bleeding does not stop when you hold steady pressure on the area.   The area near or just beyond the catheter insertion site becomes pale, cool, tingly, or numb.  These symptoms may represent a serious problem that is an emergency. Do not wait to see if the symptoms will go away. Get medical help right away. Call your local emergency services (911 in the [place] Do not drive yourself to the hospital.    Summary   After the procedure, it is common to have bruising that usually fades within 1-2 weeks.   Check your femoral site every day for signs of infection.   Do not lift anything that is heavier than 10 lb (4.5 kg), or the limit that you are told, until your health care provider says that it is safe.  This information is not intended to replace advice given to you by your health care provider. Make sure you discuss any questions you have with your health care provider.  Document Released: 03/09/2014 Document Revised: 07/19/2017 Document Reviewed: 07/19/2017  Elsevier Interactive Patient Education  2019 [employer]  Radial Site Care    This sheet gives you information about how to care for yourself after your procedure. Your health care provider may also give you more specific instructions. If you  have problems or questions, contact your health care provider.  What can I expect after the procedure?  After the procedure, it is common to have:   Bruising and tenderness at the catheter insertion area.  Follow these instructions at home:  Medicines   Take over-the-counter and prescription medicines only as told by your health care provider.  Insertion site care   Follow instructions from your health care provider about how to take care of your insertion site. Make sure you:  ? Wash your hands with soap and water before you change your bandage (dressing). If soap and water are not available, use hand sanitizer.  ? Change your dressing as told by your health care provider.  ? Leave stitches (sutures), skin glue, or adhesive strips in place. These skin closures may need to stay in place for 2 weeks or longer. If adhesive strip edges start to loosen and curl up, you may trim the loose edges. Do not remove adhesive strips completely unless your health care provider tells you to do that.   Check your insertion site every day for signs of infection. Check for:  ? Redness, swelling, or pain.  ? Fluid or blood.  ? Pus or a bad smell.  ? Warmth.   Do not take baths, swim, or use a hot tub until your health care provider approves.   You may shower 24-48 hours after the procedure, or as directed by your health care provider.  ? Remove the dressing and gently wash the site with plain soap and water.  ? Pat the area dry with a clean towel.  ? Do not rub the site. That could cause bleeding.   Do not apply powder or lotion to the site.  Activity     For 24 hours after the procedure, or as directed by your health care provider:  ? Do not flex or bend the affected arm.  ? Do not push or pull heavy objects with the affected arm.  ? Do not drive yourself home from the hospital or clinic. You may drive 24 hours after the procedure unless your health care provider tells you not to.  ? Do not operate machinery or power  tools.   Do not lift anything that is heavier than 10 lb (4.5 kg), or the limit that you are told, until your health care provider says that it is safe.   Ask your health care provider when it is okay to:  ? Return to work or school.  ? Resume usual physical activities or sports.  ? Resume sexual activity.  General instructions   If the catheter site starts to bleed, raise   your arm and put firm pressure on the site. If the bleeding does not stop, get help right away. This is a medical emergency.   If you went home on the same day as your procedure, a responsible adult should be with you for the first 24 hours after you arrive home.   Keep all follow-up visits as told by your health care provider. This is important.  Contact a health care provider if:   You have a fever.   You have redness, swelling, or yellow drainage around your insertion site.  Get help right away if:   You have unusual pain at the radial site.   The catheter insertion area swells very fast.   The insertion area is bleeding, and the bleeding does not stop when you hold steady pressure on the area.   Your arm or hand becomes pale, cool, tingly, or numb.  These symptoms may represent a serious problem that is an emergency. Do not wait to see if the symptoms will go away. Get medical help right away. Call your local emergency services (911 in the [place] Do not drive yourself to the hospital.  Summary   After the procedure, it is common to have bruising and tenderness at the site.   Follow instructions from your health care provider about how to take care of your radial site wound. Check the wound every day for signs of infection.   Do not lift anything that is heavier than 10 lb (4.5 kg), or the limit that you are told, until your health care provider says that it is safe.  This information is not intended to replace advice given to you by your health care provider. Make sure you discuss any questions you have with your health care  provider.  Document Released: 08/08/2010 Document Revised: 08/11/2017 Document Reviewed: 08/11/2017  Elsevier Interactive Patient Education  2019 [employer] Inc[employer]

## 2018-08-16 NOTE — Procedures (Signed)
S/P RT subclavian and LT vertebral arteriograms retrotonsillar abscess rad and RT CFA approach. Findings. 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

## 2018-08-16 NOTE — H&P (Signed)
Chief Complaint: Patient was seen in consultation today for cerebral arteriogram   Referring MD:  Dr Royal Hawthorn  Supervising Physician: Luanne Bras  Patient Status: [facility] - Out-pt  History of Present Illness: [name] is a 63 y.o. male   Hx Basilar artery angioplasty/stent 01/2016 Denies any neurological symptoms Denies headache; dizziness Denies change in vision or speech Speech has been slow since first CVA  Last imaging MRI 03/2018:  IMPRESSION: 1. Unchanged appearance of the stented basilar artery, with approximately 40% proximal in stent stenosis. 2. Chronic severe narrowing of the right PCA P3 segment. 3. 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 4. 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 5. Short segment occlusion of the proximal pericallosal branch of the anterior cerebral arteries, which was not included in the field-of-view of the prior study.  Pt was scheduled for follow up cerebral arteriogram 08/09/18 Was rescheduled to today secondary Code Stroke emergency that day  Scheduled now for cerebral arteriogram LD Eliquis Sat On Plavix  Past Medical History:  Diagnosis Date  . Arthritis    "hx; cleaned it out of both shoulders"  . CAD (coronary artery disease)    OV, Dr Harlow Asa, [facility] 5/12 on chart  EKG 10/12 EPIC,  chest x ray 01/07/11 EPIC  . Carpal tunnel syndrome    peripheral neuropathy  . Chronic shoulder pain    "both"  . DVT (deep venous thrombosis) (HCC)    hx LLE  . History of kidney stones   . Hyperlipidemia   . Hypertension   . Myocardial infarction ([place]) 02/2001  . Neuropathy, peripheral    both feet  . Peripheral vascular disease, unspecified ([place]) 03/2015   PCI to the right popliteal  . Pseudobulbar affect   . Skin cancer    "have had them cut or burned off my face" (03/28/2015)  . Stroke ([place]) 10/10/2015  . Type II diabetes mellitus ([place])      Past Surgical History:  Procedure Laterality Date  . APPENDECTOMY  1977  . CARDIAC CATHETERIZATION  2002      . CARPAL TUNNEL RELEASE Right 2000's  . CARPAL TUNNEL RELEASE  12/18/2011   Procedure: CARPAL TUNNEL RELEASE;  Surgeon: Mcarthur Rossetti, MD;  Location: [facility];  Service: Orthopedics;  Laterality: Left;  Left Open Carpal Tunnel Release  . CORONARY ANGIOPLASTY    . CORONARY ARTERY BYPASS GRAFT  2002   CABG X 4  . CYSTOSCOPY  several done in past  . FEMORAL-TIBIAL BYPASS GRAFT Left 01/07/11   fem-posterior tibial BPG using reversed left GSV               12/15/11 OK BY DR Ninfa Linden TO CONTINUE ASA AND PLAVIX  . FEMORAL-TIBIAL BYPASS GRAFT Left 09/06/2015   Procedure: LEFT FEMORAL-POSTERIOR TIBIAL ARTERY BYPASS GRAFT WITH COMPOSITE PTFE AND RIGHT ARM VEIN;  Surgeon: Elam Dutch, MD;  Location: [place];  Service: Vascular;  Laterality: Left;  . FEMOROPOPLITEAL THROMBECTOMY / EMBOLECTOMY  ~ 2010  . FRACTURE SURGERY    . IR ANGIO INTRA EXTRACRAN SEL COM CAROTID INNOMINATE BILAT MOD SED  03/02/2017  . IR ANGIO VERTEBRAL SEL VERTEBRAL UNI L MOD SED  03/02/2017  . IR ANGIOGRAM EXTREMITY RIGHT  03/02/2017  . IR GENERIC HISTORICAL  02/18/2016   IR RADIOLOGIST EVAL & MGMT 02/18/2016 [facility]  . IR GENERIC HISTORICAL  07/30/2016   IR ANGIO VERTEBRAL SEL VERTEBRAL UNI L MOD  SED 07/30/2016 Luanne Bras, MD [facility]  . IR GENERIC HISTORICAL  07/30/2016   IR ANGIO INTRA EXTRACRAN SEL COM CAROTID INNOMINATE BILAT MOD SED 07/30/2016 Luanne Bras, MD [facility]  . KNEE ARTHROSCOPY Left X 2  . LAPAROSCOPIC CHOLECYSTECTOMY  1990's  . LITHOTRIPSY  several done in past  . ORIF RADIUS & ULNA FRACTURES Left   . PERIPHERAL VASCULAR CATHETERIZATION N/A 03/28/2015   Procedure: Lower Extremity Angiography;  Surgeon: Lorretta Harp, MD;  Location: [place] [facility];  Service: Cardiovascular;  Laterality: N/A;  . PERIPHERAL VASCULAR CATHETERIZATION Right 03/28/2015   Procedure:  Peripheral Vascular Atherectomy;  Surgeon: Lorretta Harp, MD;  Location: [place] [facility];  Service: Cardiovascular;  Laterality: Right;  popliteal;   . PERIPHERAL VASCULAR CATHETERIZATION N/A 08/23/2015   Procedure: Abdominal Aortogram;  Surgeon: Elam Dutch, MD;  Location: [place] [facility];  Service: Cardiovascular;  Laterality: N/A;  . POPLITEAL ARTERY STENT Left 2010-2012 X 4  . RADIOLOGY WITH ANESTHESIA N/A 02/04/2016   Procedure: Basilar artery angioplasty with stenting;  Surgeon: Luanne Bras, MD;  Location: [place];  Service: Radiology;  Laterality: N/A;  . SHOULDER ARTHROSCOPY Left   . SHOULDER ARTHROSCOPY Right 12/18/2011  . SHOULDER ARTHROSCOPY  07/08/2012   Procedure: ARTHROSCOPY SHOULDER;  Surgeon: Mcarthur Rossetti, MD;  Location: [facility];  Service: Orthopedics;  Laterality: Left;  Left Shoulder Arthroscopy with Manipulation and Extensive Debridement  . SHOULDER ARTHROSCOPY WITH ROTATOR CUFF REPAIR Left 07/14/2013   Procedure: LEFT SHOULDER ARTHROSCOPY WITH EXTENSIVE DEBRIDEMENT, DISTAL CLAVICLE REPAIR;  Surgeon: Mcarthur Rossetti, MD;  Location: [facility];  Service: Orthopedics;  Laterality: Left;  . SKIN CANCER EXCISION     "left side of my forehead"  . VEIN HARVEST Right 09/06/2015   Procedure: RIGHT ARM VEIN HARVEST;  Surgeon: Elam Dutch, MD;  Location: [place];  Service: Vascular;  Laterality: Right;    Allergies: Patient has no known allergies.  Medications: Prior to Admission medications   Medication Sig Start Date End Date Taking? Authorizing Provider  atorvastatin (LIPITOR) 40 MG tablet Take 40 mg by mouth daily.   Yes [provider]  clopidogrel (PLAVIX) 75 MG tablet TAKE 1 TABLET BY MOUTH DAILY 07/04/18  Yes Lorretta Harp, MD  empagliflozin (JARDIANCE) 10 MG TABS tablet Take 10 mg by mouth daily.   Yes [provider]  fenofibrate (TRICOR) 145 MG tablet Take 1 tablet (145 mg total) by mouth daily. Please schedule  appointment for refills. 06/13/18  Yes Lorretta Harp, MD  furosemide (LASIX) 20 MG tablet Take 1 tablet (20 mg total) by mouth daily. 08/12/18 11/10/18 Yes Almyra Deforest, PA  gabapentin (NEURONTIN) 300 MG capsule Take 300 mg by mouth 3 (three) times daily.    Yes [provider]  insulin NPH-regular Human (70-30) 100 UNIT/ML injection Inject 25-75 Units into the skin See admin instructions. 75 units with breakfast and dinner, and 25 units at lunch   Yes [provider]  lisinopril (PRINIVIL,ZESTRIL) 20 MG tablet Take 1 tablet (20 mg total) by mouth daily. 08/12/18 11/10/18 Yes Almyra Deforest, PA  metFORMIN (GLUCOPHAGE-XR) 500 MG 24 hr tablet Take 1 tablet (500 mg total) by mouth 2 (two) times daily. 02/01/16  Yes Hongalgi, Lenis Dickinson, MD  metoprolol succinate (TOPROL-XL) 25 MG 24 hr tablet TAKE 1 TABLET (25 MG TOTAL) BY MOUTH DAILY. *DUE 9/17* 05/18/18  Yes Lorretta Harp, MD  naproxen sodium (ALEVE) 220 MG tablet Take 220 mg by  mouth daily.   Yes [provider]  niacin 500 MG tablet Take 500 mg by mouth daily after supper.   Yes [provider]  ELIQUIS 5 MG TABS tablet TAKE 1 TABLET BY MOUTH TWICE A DAY 05/24/17   Lorretta Harp, MD     Family History  Problem Relation Age of Onset  . Cancer Mother        Breast and Brain tumor  . Cancer Father        Blood vessel tumor    Social History   Socioeconomic History  . Marital status: Widowed    Spouse name: Not on file  . Number of children: Not on file  . Years of education: Not on file  . Highest education level: Not on file  Occupational History  . Not on file  Social Needs  . Financial resource strain: Not on file  . Food insecurity:    Worry: Not on file    Inability: Not on file  . Transportation needs:    Medical: Not on file    Non-medical: Not on file  Tobacco Use  . Smoking status: Never Smoker  . Smokeless tobacco: Never Used  Substance and Sexual Activity  . Alcohol use: No  . Drug  use: No  . Sexual activity: Yes  Lifestyle  . Physical activity:    Days per week: Not on file    Minutes per session: Not on file  . Stress: Not on file  Relationships  . Social connections:    Talks on phone: Not on file    Gets together: Not on file    Attends religious service: Not on file    Active member of club or organization: Not on file    Attends meetings of clubs or organizations: Not on file    Relationship status: Not on file  Other Topics Concern  . Not on file  Social History Narrative  . Not on file    Review of Systems: A 12 point ROS discussed and pertinent positives are indicated in the HPI above.  All other systems are negative.  Review of Systems  Constitutional: Negative for activity change, fatigue and fever.  HENT: Negative for hearing loss and tinnitus.   Respiratory: Negative for cough and shortness of breath.   Cardiovascular: Negative for chest pain.  Gastrointestinal: Negative for abdominal pain.  Neurological: Positive for speech difficulty. Negative for dizziness, tremors, seizures, syncope, facial asymmetry, weakness, light-headedness, numbness and headaches.  Psychiatric/Behavioral: Negative for behavioral problems and confusion.    Vital Signs: BP (!) 182/79   Pulse 66   Temp 97.7 F (36.5 C)   Ht 5\' 10"  (1.778 m)   Wt 250 lb (113.4 kg)   SpO2 98%   BMI 35.87 kg/m   Physical Exam Vitals signs reviewed.  Constitutional:      Appearance: Normal appearance.  HENT:     Head: Atraumatic.     Mouth/Throat:     Mouth: Mucous membranes are moist.  Eyes:     Extraocular Movements: Extraocular movements intact.  Cardiovascular:     Rate and Rhythm: Normal rate and regular rhythm.     Heart sounds: Normal heart sounds.  Pulmonary:     Effort: Pulmonary effort is normal.     Breath sounds: Normal breath sounds.  Abdominal:     General: Bowel sounds are normal.     Palpations: Abdomen is soft.     Tenderness: There is no abdominal  tenderness.  Musculoskeletal: Normal range of motion.  Skin:    General: Skin is warm and dry.  Neurological:     General: No focal deficit present.     Mental Status: He is oriented to person, place, and time.  Psychiatric:        Mood and Affect: Mood normal.        Behavior: Behavior normal.        Thought Content: Thought content normal.        Judgment: Judgment normal.     Imaging: No results found.  Labs:  CBC: Recent Labs    08/09/18 0648  WBC 7.6  HGB 13.2  HCT 42.7  PLT 263    COAGS: Recent Labs    08/09/18 0648  INR 1.04    BMP: Recent Labs    09/15/17 1148 04/13/18 1053 08/09/18 0648  NA  --   --  139  K  --   --  3.9  CL  --   --  108  CO2  --   --  22  GLUCOSE  --   --  172*  BUN  --   --  21  CALCIUM  --   --  9.6  CREATININE 1.16 1.00 1.40*  GFRNONAA >60  --  53*  GFRAA >60  --  >60    LIVER FUNCTION TESTS: No results for input(s): BILITOT, AST, ALT, ALKPHOS, PROT, ALBUMIN in the last 8760 hours.  TUMOR MARKERS: No results for input(s): AFPTM, CEA, CA199, CHROMGRNA in the last 8760 hours.  Assessment and Plan:  Hx Basilar artery angioplasty/stent 01/2016 CVA Scheduled for follow up arteriogram Pt is doing well Risks and benefits of cerebral angiogram with intervention were discussed with the patient including, but not limited to bleeding, infection, vascular injury, contrast induced renal failure, stroke or even death.  This interventional procedure involves the use of X-rays and because of the nature of the planned procedure, it is possible that we will have prolonged use of X-ray fluoroscopy.  Potential radiation risks to you include (but are not limited to) the following: - A slightly elevated risk for cancer  several years later in life. This risk is typically less than 0.5% percent. This risk is low in comparison to the normal incidence of human cancer, which is 33% for women and 50% for men according to the [employer]. - Radiation induced injury can include skin redness, resembling a rash, tissue breakdown / ulcers and hair loss (which can be temporary or permanent).   The likelihood of either of these occurring depends on the difficulty of the procedure and whether you are sensitive to radiation due to previous procedures, disease, or genetic conditions.   IF your procedure requires a prolonged use of radiation, you will be notified and given written instructions for further action.  It is your responsibility to monitor the irradiated area for the 2 weeks following the procedure and to notify your physician if you are concerned that you have suffered a radiation induced injury.    All of the patient's questions were answered, patient is agreeable to proceed.  Consent signed and in chart.   Thank you for this interesting consult.  I greatly enjoyed meeting [name] and look forward to participating in their care.  A copy of this report was sent to the requesting provider on this date.  Electronically Signed: Lavonia Drafts, PA-C 08/16/2018, 9:52 AM   I spent a total of  25 Minutes in face to face in clinical consultation, greater than 50% of which was counseling/coordinating care for cerebral arteriogram

## 2018-08-17 ENCOUNTER — Encounter (HOSPITAL_COMMUNITY): Payer: Self-pay | Admitting: Interventional Radiology

## 2018-08-18 ENCOUNTER — Other Ambulatory Visit (HOSPITAL_COMMUNITY): Payer: Medicare HMO

## 2018-08-18 DIAGNOSIS — R69 Illness, unspecified: Secondary | ICD-10-CM | POA: Diagnosis not present

## 2018-08-19 ENCOUNTER — Other Ambulatory Visit: Payer: Self-pay | Admitting: Cardiovascular Disease

## 2018-08-19 NOTE — Telephone Encounter (Signed)
Pt is a 63 yr old male who saw PA on 08/12/18, weight at that visit was 118.2Kg. Last SCr on 08/16/18 was 1.42. Will refill Eliquis 5mg  BID.

## 2018-08-22 ENCOUNTER — Ambulatory Visit (HOSPITAL_COMMUNITY): Payer: Medicare HMO | Attending: Cardiology

## 2018-08-22 ENCOUNTER — Encounter (HOSPITAL_COMMUNITY): Payer: Self-pay | Admitting: *Deleted

## 2018-08-22 DIAGNOSIS — E7800 Pure hypercholesterolemia, unspecified: Secondary | ICD-10-CM | POA: Diagnosis not present

## 2018-08-22 DIAGNOSIS — E1140 Type 2 diabetes mellitus with diabetic neuropathy, unspecified: Secondary | ICD-10-CM | POA: Diagnosis not present

## 2018-08-22 DIAGNOSIS — E1121 Type 2 diabetes mellitus with diabetic nephropathy: Secondary | ICD-10-CM | POA: Diagnosis not present

## 2018-08-22 DIAGNOSIS — R0602 Shortness of breath: Secondary | ICD-10-CM

## 2018-08-22 DIAGNOSIS — I5043 Acute on chronic combined systolic (congestive) and diastolic (congestive) heart failure: Secondary | ICD-10-CM

## 2018-08-22 DIAGNOSIS — I10 Essential (primary) hypertension: Secondary | ICD-10-CM | POA: Diagnosis not present

## 2018-08-22 DIAGNOSIS — E1165 Type 2 diabetes mellitus with hyperglycemia: Secondary | ICD-10-CM | POA: Diagnosis not present

## 2018-08-22 DIAGNOSIS — Z79899 Other long term (current) drug therapy: Secondary | ICD-10-CM

## 2018-08-22 NOTE — Progress Notes (Unsigned)
Patient ID: [name], male   DOB: [birth year], 63 y.o.   MRN: [id number]   Two attempts were made for IV access during the echocardiogram without success.  Venous Access is difficult due to multiple scars and bruises on both arms.  [name] refuses any further attempts at this time, and says if need be he would rather return at a later date.   Deliah Boston, RDCS

## 2018-08-23 LAB — BASIC METABOLIC PANEL
BUN/Creatinine Ratio: 19 (ref 10–24)
BUN: 26 mg/dL (ref 8–27)
CALCIUM: 10.1 mg/dL (ref 8.6–10.2)
CO2: 23 mmol/L (ref 20–29)
CREATININE: 1.4 mg/dL — AB (ref 0.76–1.27)
Chloride: 106 mmol/L (ref 96–106)
GFR calc Af Amer: 61 mL/min/{1.73_m2} (ref >59)
GFR calc non Af Amer: 53 mL/min/{1.73_m2} — ABNORMAL LOW (ref >59)
Glucose: 116 mg/dL — ABNORMAL HIGH (ref 65–99)
Potassium: 4.8 mmol/L (ref 3.5–5.2)
Sodium: 144 mmol/L (ref 134–144)

## 2018-08-24 NOTE — Progress Notes (Signed)
Kidney function and electrolyte ok.

## 2018-08-25 ENCOUNTER — Telehealth: Payer: Self-pay

## 2018-08-25 NOTE — Telephone Encounter (Signed)
Left voice message for patient for lab results

## 2018-08-25 NOTE — Telephone Encounter (Signed)
Left voice message for the patient for ECHO and lab results.   Despite recent swelling, pumping function of the heart actually much better than before. Mildly leaky arotic valve which does not need to be fixed.   Kidney function and electrolyte ok.  Per Almyra Deforest, PA-C

## 2018-08-25 NOTE — Progress Notes (Signed)
Left voice message for patient to call back for lab results

## 2018-08-25 NOTE — Progress Notes (Signed)
Left voice message for patient to call back for ECHO results.

## 2018-08-26 ENCOUNTER — Telehealth: Payer: Self-pay

## 2018-08-26 NOTE — Progress Notes (Signed)
Left detailed message for the patient of his ECHO and lab results also stated to call back if he has any questions

## 2018-08-26 NOTE — Progress Notes (Signed)
Left detailed message for the patient of his lab and ECHO results also stated to call back if he has any questions

## 2018-08-26 NOTE — Telephone Encounter (Signed)
Left detailed message for the patient of his ECHO and lab results stated to call the office back if he has any questions.  ECHO results: Despite recent swelling, pumping function of the heart actually much better than before. Mildly leaky arotic valve which does not need to be fixed  Lab results: Kidney function and electrolyte ok.

## 2018-08-29 ENCOUNTER — Telehealth: Payer: Self-pay

## 2018-08-29 NOTE — Progress Notes (Signed)
3rd attempt-Called and left voice message for the patient to call back for lab and ECHO results. A letter was also put in the mail.

## 2018-08-29 NOTE — Telephone Encounter (Signed)
3rd attempt-Called and left voice message for the patient to call back for lab and ECHO results. A letter was also put in the mail.

## 2018-09-09 ENCOUNTER — Encounter: Payer: Self-pay | Admitting: Physician Assistant

## 2018-09-09 ENCOUNTER — Ambulatory Visit: Payer: Medicare HMO | Admitting: Physician Assistant

## 2018-09-09 VITALS — BP 164/88 | HR 62 | Ht 70.0 in | Wt 269.2 lb

## 2018-09-09 DIAGNOSIS — Z9989 Dependence on other enabling machines and devices: Secondary | ICD-10-CM

## 2018-09-09 DIAGNOSIS — Z8673 Personal history of transient ischemic attack (TIA), and cerebral infarction without residual deficits: Secondary | ICD-10-CM

## 2018-09-09 DIAGNOSIS — I2510 Atherosclerotic heart disease of native coronary artery without angina pectoris: Secondary | ICD-10-CM

## 2018-09-09 DIAGNOSIS — E119 Type 2 diabetes mellitus without complications: Secondary | ICD-10-CM | POA: Diagnosis not present

## 2018-09-09 DIAGNOSIS — I482 Chronic atrial fibrillation, unspecified: Secondary | ICD-10-CM | POA: Diagnosis not present

## 2018-09-09 DIAGNOSIS — G4733 Obstructive sleep apnea (adult) (pediatric): Secondary | ICD-10-CM | POA: Diagnosis not present

## 2018-09-09 DIAGNOSIS — I10 Essential (primary) hypertension: Secondary | ICD-10-CM

## 2018-09-09 DIAGNOSIS — I5033 Acute on chronic diastolic (congestive) heart failure: Secondary | ICD-10-CM

## 2018-09-09 DIAGNOSIS — E785 Hyperlipidemia, unspecified: Secondary | ICD-10-CM

## 2018-09-09 MED ORDER — POTASSIUM CHLORIDE CRYS ER 20 MEQ PO TBCR: 20.0000 meq | EXTENDED_RELEASE_TABLET | Freq: Two times a day (BID) | ORAL | 1 refills | Status: DC

## 2018-09-09 MED ORDER — FUROSEMIDE 40 MG PO TABS: 40.0000 mg | ORAL_TABLET | Freq: Two times a day (BID) | ORAL | 1 refills | Status: DC

## 2018-09-09 MED ORDER — METOLAZONE 2.5 MG PO TABS: ORAL_TABLET | ORAL | 0 refills | Status: DC

## 2018-09-09 NOTE — Progress Notes (Signed)
Cardiology Office Note    Date:  09/09/2018   ID:  [name], DOB June 13, 1956, MRN [id number]  PCP:  Patient, No Pcp Per  Cardiologist:  Dr. Gwenlyn Found   Chief Complaint  Patient presents with  . Follow-up    seen for Dr. Gwenlyn Found.     History of Present Illness:  [name] is a 63 y.o. male with PMH of CAD, hypertension, hyperlipidemia followed at [facility], history of DVT, chronic atrial fibrillation on eliquis, DM 2, PAD, OSA on CPAP followed by neurology and CVA.  Patient had history of CAD dating back to 2002 and underwent back CABG x4 at the time.  He also has peripheral neuropathy and peripheral arterial disease.  He had history of left lower extremity stent in the past.  He required above to below-knee bypass graft by Dr. Oneida Alar due to restenosis in the lower extremity artery.  Myoview obtained on 02/07/2015 showed EF 52%, overall low risk study.  In 2017, it was noted that his left femoral-popliteal bypass graft has occluded by angiography.  He underwent redo composite vein left fem-tibial bypass graft.  Due to recurrent stroke, he was diagnosed with atrial fibrillation and was started on Eliquis.  A repeat echocardiogram on 01/31/2016 showed EF 45 to 50%, mild LVH.  His last office visit with Dr. Gwenlyn Found was on 04/02/2017, at which time he was doing well.  I saw the patient in January 2024 weight gain, increased dyspnea and lower extremity edema.  I discontinued his lisinopril-hydrochlorothiazide combination and started him on lisinopril 20 mg daily by itself.  I also started him on Lasix 20 mg daily.  Repeat echocardiogram on 08/22/2018 showed EF of 60 to 65%, moderate LAE, mild aortic regurgitation.  Patient presents today for cardiology office visit.  He cannot walk a few feet without severe short of breath.  He even has some degree of shortness of breath at rest.  His O2 saturation is 95%, however he has New York Heart Association functional class III or IV dyspnea.  He gained another 9 pounds.   Even though he says he goes to the bathroom every few minutes and has significantly cut back on the amount of fluid he is drinking, he has gained another 9 pounds.  He is now close to 20 pounds off of his baseline weight.  I discussed the case with Dr. Alvester Chou who has also seen the patient.  We emphasized on the importance of salt restriction.  We will increase his Lasix to 40 mg twice daily and add every other day of 2.5 mg metolazone to be taken 20 minutes prior to his morning Lasix.  I added potassium supplement 20 mEq twice daily to his medical regimen.  He will need basic metabolic panel next Tuesday or Wednesday.  I plan to see the patient back next Friday for reassessment.  If his breathing continue to worsen or if he developed paroxysmal nocturnal dyspnea, he will need to seek urgent medical attention.  I have very low threshold to admit the patient for acute on chronic diastolic heart failure.  Past Medical History:  Diagnosis Date  . Arthritis    "hx; cleaned it out of both shoulders"  . CAD (coronary artery disease)    OV, Dr Harlow Asa, MYOVIEW 5/12 on chart  EKG 10/12 EPIC,  chest x ray 01/07/11 EPIC  . Carpal tunnel syndrome    peripheral neuropathy  . Chronic shoulder pain    "both"  . DVT (deep venous thrombosis) (  HCC)    hx LLE  . History of kidney stones   . Hyperlipidemia   . Hypertension   . Myocardial infarction ([place]) 02/2001  . Neuropathy, peripheral    both feet  . Peripheral vascular disease, unspecified ([place]) 03/2015   PCI to the right popliteal  . Pseudobulbar affect   . Skin cancer    "have had them cut or burned off my face" (03/28/2015)  . Stroke ([place]) 10/10/2015  . Type II diabetes mellitus ([place])     Past Surgical History:  Procedure Laterality Date  . APPENDECTOMY  1977  . CARDIAC CATHETERIZATION  2002      . CARPAL TUNNEL RELEASE Right 2000's  . CARPAL TUNNEL RELEASE  12/18/2011   Procedure: CARPAL TUNNEL RELEASE;  Surgeon: Mcarthur Rossetti, MD;   Location: [facility];  Service: Orthopedics;  Laterality: Left;  Left Open Carpal Tunnel Release  . CORONARY ANGIOPLASTY    . CORONARY ARTERY BYPASS GRAFT  2002   CABG X 4  . CYSTOSCOPY  several done in past  . FEMORAL-TIBIAL BYPASS GRAFT Left 01/07/11   fem-posterior tibial BPG using reversed left GSV               12/15/11 OK BY DR Ninfa Linden TO CONTINUE ASA AND PLAVIX  . FEMORAL-TIBIAL BYPASS GRAFT Left 09/06/2015   Procedure: LEFT FEMORAL-POSTERIOR TIBIAL ARTERY BYPASS GRAFT WITH COMPOSITE PTFE AND RIGHT ARM VEIN;  Surgeon: Elam Dutch, MD;  Location: [place];  Service: Vascular;  Laterality: Left;  . FEMOROPOPLITEAL THROMBECTOMY / EMBOLECTOMY  ~ 2010  . FRACTURE SURGERY    . IR ANGIO INTRA EXTRACRAN SEL COM CAROTID INNOMINATE BILAT MOD SED  03/02/2017  . IR ANGIO VERTEBRAL SEL SUBCLAVIAN INNOMINATE UNI R MOD SED  08/16/2018  . IR ANGIO VERTEBRAL SEL VERTEBRAL UNI L MOD SED  03/02/2017  . IR ANGIO VERTEBRAL SEL VERTEBRAL UNI L MOD SED  08/16/2018  . IR ANGIOGRAM EXTREMITY RIGHT  03/02/2017  . IR GENERIC HISTORICAL  02/18/2016   IR RADIOLOGIST EVAL & MGMT 02/18/2016 [facility]  . IR GENERIC HISTORICAL  07/30/2016   IR ANGIO VERTEBRAL SEL VERTEBRAL UNI L MOD SED 07/30/2016 Luanne Bras, MD [facility]  . IR GENERIC HISTORICAL  07/30/2016   IR ANGIO INTRA EXTRACRAN SEL COM CAROTID INNOMINATE BILAT MOD SED 07/30/2016 Luanne Bras, MD [facility]  . IR US GUIDE VASC ACCESS RIGHT  08/16/2018  . KNEE ARTHROSCOPY Left X 2  . LAPAROSCOPIC CHOLECYSTECTOMY  1990's  . LITHOTRIPSY  several done in past  . ORIF RADIUS & ULNA FRACTURES Left   . PERIPHERAL VASCULAR CATHETERIZATION N/A 03/28/2015   Procedure: Lower Extremity Angiography;  Surgeon: Lorretta Harp, MD;  Location: [place] [facility];  Service: Cardiovascular;  Laterality: N/A;  . PERIPHERAL VASCULAR CATHETERIZATION Right 03/28/2015   Procedure: Peripheral Vascular Atherectomy;  Surgeon: Lorretta Harp, MD;  Location: [place] [facility];   Service: Cardiovascular;  Laterality: Right;  popliteal;   . PERIPHERAL VASCULAR CATHETERIZATION N/A 08/23/2015   Procedure: Abdominal Aortogram;  Surgeon: Elam Dutch, MD;  Location: [place] [facility];  Service: Cardiovascular;  Laterality: N/A;  . POPLITEAL ARTERY STENT Left 2010-2012 X 4  . RADIOLOGY WITH ANESTHESIA N/A 02/04/2016   Procedure: Basilar artery angioplasty with stenting;  Surgeon: Luanne Bras, MD;  Location: [place];  Service: Radiology;  Laterality: N/A;  . SHOULDER ARTHROSCOPY Left   . SHOULDER ARTHROSCOPY Right 12/18/2011  . SHOULDER ARTHROSCOPY  07/08/2012  Procedure: ARTHROSCOPY SHOULDER;  Surgeon: Mcarthur Rossetti, MD;  Location[facility];  Service: Orthopedics;  Laterality: Left;  Left Shoulder Arthroscopy with Manipulation and Extensive Debridement  . SHOULDER ARTHROSCOPY WITH ROTATOR CUFF REPAIR Left 07/14/2013   Procedure: LEFT SHOULDER ARTHROSCOPY WITH EXTENSIVE DEBRIDEMENT, DISTAL CLAVICLE REPAIR;  Surgeon: Mcarthur Rossetti, MD;  [facility];  Service: Orthopedics;  Laterality: Left;  . SKIN CANCER EXCISION     "left side of my forehead"  . VEIN HARVEST Right 09/06/2015   Procedure: RIGHT ARM VEIN HARVEST;  Surgeon: Elam Dutch, MD;  Location: [facility];  Service: Vascular;  Laterality: Right;    Current Medications: Outpatient Medications Prior to Visit  Medication Sig Dispense Refill  . apixaban (ELIQUIS) 5 MG TABS tablet Take 1 tablet (5 mg total) by mouth 2 (two) times daily. 60 tablet 10  . atorvastatin (LIPITOR) 40 MG tablet Take 40 mg by mouth daily.    . clopidogrel (PLAVIX) 75 MG tablet TAKE 1 TABLET BY MOUTH DAILY 90 tablet 0  . empagliflozin (JARDIANCE) 10 MG TABS tablet Take 10 mg by mouth daily.    . fenofibrate (TRICOR) 145 MG tablet Take 1 tablet (145 mg total) by mouth daily. Please schedule appointment for refills. 90 tablet 0  . gabapentin (NEURONTIN) 300 MG capsule Take 300 mg by mouth 3 (three) times daily.     .  insulin NPH-regular Human (70-30) 100 UNIT/ML injection Inject 25-75 Units into the skin See admin instructions. 75 units with breakfast and dinner, and 25 units at lunch    . lisinopril (PRINIVIL,ZESTRIL) 20 MG tablet Take 1 tablet (20 mg total) by mouth daily. 30 tablet 6  . metFORMIN (GLUCOPHAGE-XR) 500 MG 24 hr tablet Take 1 tablet (500 mg total) by mouth 2 (two) times daily.    . metoprolol succinate (TOPROL-XL) 25 MG 24 hr tablet TAKE 1 TABLET (25 MG TOTAL) BY MOUTH DAILY. *DUE 9/17* 90 tablet 1  . naproxen sodium (ALEVE) 220 MG tablet Take 220 mg by mouth daily.    . niacin 500 MG tablet Take 500 mg by mouth daily after supper.    . furosemide (LASIX) 20 MG tablet Take 1 tablet (20 mg total) by mouth daily. 30 tablet 6   No facility-administered medications prior to visit.      Allergies:   Patient has no known allergies.   Social History   Socioeconomic History  . Marital status: Widowed    Spouse name: Not on file  . Number of children: Not on file  . Years of education: Not on file  . Highest education level: Not on file  Occupational History  . Not on file  Social Needs  . Financial resource strain: Not on file  . Food insecurity:    Worry: Not on file    Inability: Not on file  . Transportation needs:    Medical: Not on file    Non-medical: Not on file  Tobacco Use  . Smoking status: Never Smoker  . Smokeless tobacco: Never Used  Substance and Sexual Activity  . Alcohol use: No  . Drug use: No  . Sexual activity: Yes  Lifestyle  . Physical activity:    Days per week: Not on file    Minutes per session: Not on file  . Stress: Not on file  Relationships  . Social connections:    Talks on phone: Not on file    Gets together: Not on file    Attends religious  service: Not on file    Active member of club or organization: Not on file    Attends meetings of clubs or organizations: Not on file    Relationship status: Not on file  Other Topics Concern  . Not on  file  Social History Narrative  . Not on file     Family History:  The patient's family history includes Cancer in his father and mother.   ROS:   Please see the history of present illness.    ROS All other systems reviewed and are negative.   PHYSICAL EXAM:   VS:  BP (!) 164/88   Pulse 62   Ht 5\' 10"  (1.778 m)   Wt 269 lb 3.2 oz (122.1 kg)   SpO2 95%   BMI 38.63 kg/m    GEN: Well nourished, well developed, in no acute distress  HEENT: normal  Neck: no JVD, carotid bruits, or masses Cardiac: Irregularly irregular; no murmurs, rubs, or gallops. 3+ pitting edema  Respiratory:  clear to auscultation bilaterally, normal work of breathing GI: soft, nontender, nondistended, + BS MS: no deformity or atrophy  Skin: warm and dry, no rash Neuro:  Alert and Oriented x 3, Strength and sensation are intact Psych: euthymic mood, full affect  Wt Readings from Last 3 Encounters:  09/09/18 269 lb 3.2 oz (122.1 kg)  08/16/18 250 lb (113.4 kg)  08/12/18 260 lb 9.6 oz (118.2 kg)      Studies/Labs Reviewed:   EKG:  EKG is not ordered today.    Recent Labs: 08/09/2018: Hemoglobin 13.2; Platelets 263 08/22/2018: BUN 26; Creatinine, Ser 1.40; Potassium 4.8; Sodium 144   Lipid Panel    Component Value Date/Time   CHOL 523 (H) 01/29/2016 0538   TRIG 1,925 (H) 01/29/2016 0538   HDL NOT REPORTED DUE TO HIGH TRIGLYCERIDES 01/29/2016 0538   CHOLHDL NOT REPORTED DUE TO HIGH TRIGLYCERIDES 01/29/2016 0538   VLDL UNABLE TO CALCULATE IF TRIGLYCERIDE OVER 400 mg/dL 01/29/2016 0538   LDLCALC UNABLE TO CALCULATE IF TRIGLYCERIDE OVER 400 mg/dL 01/29/2016 0538   LDLDIRECT 87 01/16/2015 0813    Additional studies/ records that were reviewed today include:   Echo 08/22/2018  1. The left ventricle has normal systolic function of 54-00%. The cavity size is mildly increased. There is no left ventricular wall thickness. The left ventricular diastology could not be evaluated secondary to atrial  fibrillation.  2. Moderately dilated left atrial size.  3. Normal right atrial size.  4. Normal tricuspid valve.  5. The aortic valve tricuspid. There is mild thickening and moderate sclerosis of the aortic valve. Aortic valve regurgitation is mild by color flow Doppler.  6. No atrial level shunt detected by color flow Doppler.  7. The interatrial septum appears to be lipomatous.  8. The inferior vena cava was dilated in size with >50% respiratory variablity.    ASSESSMENT:    1. Acute on chronic diastolic (congestive) heart failure ([place])   2. Coronary artery disease involving native coronary artery of native heart without angina pectoris   3. Essential hypertension   4. Hyperlipidemia LDL goal <70   5. Atrial fibrillation, chronic   6. OSA on CPAP   7. Controlled type 2 diabetes mellitus without complication, without long-term current use of insulin ([place])   8. H/O: CVA (cerebrovascular accident)      PLAN:  In order of problems listed above:  1. Acute on chronic diastolic heart failure: Despite switching him from hydrochlorothiazide to Lasix, he continued to  gain weight.  He was seen along with Dr. Alvester Chou today, we will increase his Lasix to 40 mg twice daily.  Added 20 mEq twice daily of potassium supplement.  I will also start him on every other day of metolazone 2.5 mg.  Hopefully metolazone will be only a short course.  He will need basic metabolic panel in 5 days and I plan to see him back in 1 week.  I have very low threshold to admit this patient if his breathing continued to worsen.  I did fill out disability car tag for him as he has New York Heart Association class III/class IV symptom with breathing and it is clear that he cannot walk for more than a few feet without getting short of breath  2. CAD: Denies any obvious chest discomfort.  Recent echocardiogram is reassuring.  3. Hypertension: Blood pressure elevated today, however was low last time.  I will hold off on  adjusting the blood pressure medication  4. Hyperlipidemia: On Lipitor 40 mg daily.  5. Chronic atrial fibrillation: Continue Eliquis 5 mg twice daily.  Heart rate very well controlled on current medication.  6. Obstructive sleep apnea: Compliant with CPAP therapy.  This is likely one of the reason why his lungs is clear despite his significant volume overload.  7. DM2: On insulin, managed by primary care provider  8. History of CVA: No recurrence    Medication Adjustments/Labs and Tests Ordered: Current medicines are reviewed at length with the patient today.  Concerns regarding medicines are outlined above.  Medication changes, Labs and Tests ordered today are listed in the Patient Instructions below. Patient Instructions  Medication Instructions:  INCREASE Lasix to 40mg  Take 1 tablet twice a day START Potassium 67meq Take 1 tablet twice a day START Metolazone 2.5 Take 20 minutes prior to taking Lasix and take 1 tablet every other day for 3 doses ONLY If you need a refill on your cardiac medications before your next appointment, please call your pharmacy.   Lab work: Your physician recommends that you return for lab work in: Tue or Wed BMET If you have labs (blood work) drawn today and your tests are completely normal, you will receive your results only by: [name] MyChart Message (if you have MyChart) OR . A paper copy in the mail If you have any lab test that is abnormal or we need to change your treatment, we will call you to review the results.  Testing/Procedures: None   Follow-Up: At [facility], you and your health needs are our priority.  As part of our continuing mission to provide you with exceptional heart care, we have created designated Provider Care Teams.  These Care Teams include your primary Cardiologist (physician) and Advanced Practice Providers (APPs -  Physician Assistants and Nurse Practitioners) who all work together to provide you with the care you need, when  you need it.  Your physician recommends that you schedule a follow-up appointment in: 1 week with Almyra Deforest, PA-C  Any Other Special Instructions Will Be Listed Below (If Applicable). [facility] DAILY AND KEEP A WEIGHT [address]      Hilbert Corrigan, [place]  09/09/2018 11:24 AM    [address], [place], [place]  [address] Phone: [phone]; Fax: [phone]

## 2018-09-09 NOTE — Patient Instructions (Addendum)
Medication Instructions:  INCREASE Lasix to 40mg  Take 1 tablet twice a day START Potassium 21meq Take 1 tablet twice a day START Metolazone 2.5 Take 20 minutes prior to taking Lasix and take 1 tablet every other day for 3 doses ONLY If you need a refill on your cardiac medications before your next appointment, please call your pharmacy.   Lab work: Your physician recommends that you return for lab work in: Tue or Wed BMET If you have labs (blood work) drawn today and your tests are completely normal, you will receive your results only by: [name] MyChart Message (if you have MyChart) OR . A paper copy in the mail If you have any lab test that is abnormal or we need to change your treatment, we will call you to review the results.  Testing/Procedures: None   Follow-Up: At [facility], you and your health needs are our priority.  As part of our continuing mission to provide you with exceptional heart care, we have created designated Provider Care Teams.  These Care Teams include your primary Cardiologist (physician) and Advanced Practice Providers (APPs -  Physician Assistants and Nurse Practitioners) who all work together to provide you with the care you need, when you need it.  Your physician recommends that you schedule a follow-up appointment in: 1 week with Almyra Deforest, PA-C  Any Other Special Instructions Will Be Listed Below (If Applicable). CHECK YOUR WEIGHT DAILY AND KEEP A WEIGHT DIARY

## 2018-09-11 ENCOUNTER — Other Ambulatory Visit: Payer: Self-pay | Admitting: Cardiovascular Disease

## 2018-09-14 DIAGNOSIS — I5033 Acute on chronic diastolic (congestive) heart failure: Secondary | ICD-10-CM | POA: Diagnosis not present

## 2018-09-14 LAB — BASIC METABOLIC PANEL
BUN/Creatinine Ratio: 28 — ABNORMAL HIGH (ref 10–24)
BUN: 49 mg/dL — ABNORMAL HIGH (ref 8–27)
CO2: 27 mmol/L (ref 20–29)
Calcium: 10.8 mg/dL — ABNORMAL HIGH (ref 8.6–10.2)
Chloride: 102 mmol/L (ref 96–106)
Creatinine, Ser: 1.76 mg/dL — ABNORMAL HIGH (ref 0.76–1.27)
GFR calc non Af Amer: 40 mL/min/{1.73_m2} — ABNORMAL LOW (ref >59)
GFR, EST AFRICAN AMERICAN: 46 mL/min/{1.73_m2} — AB (ref >59)
Glucose: 211 mg/dL — ABNORMAL HIGH (ref 65–99)
Potassium: 4.5 mmol/L (ref 3.5–5.2)
Sodium: 139 mmol/L (ref 134–144)

## 2018-09-15 NOTE — Progress Notes (Signed)
The patient has been notified of the result and verbalized understanding.  All questions (if any) were answered. Jacqulynn Cadet, CMA 09/15/2018 12:12 PM

## 2018-09-16 ENCOUNTER — Ambulatory Visit: Payer: Medicare HMO | Admitting: Physician Assistant

## 2018-09-16 ENCOUNTER — Encounter: Payer: Self-pay | Admitting: Physician Assistant

## 2018-09-16 VITALS — BP 118/76 | HR 65 | Ht 70.0 in | Wt 257.6 lb

## 2018-09-16 DIAGNOSIS — Z9989 Dependence on other enabling machines and devices: Secondary | ICD-10-CM

## 2018-09-16 DIAGNOSIS — I2510 Atherosclerotic heart disease of native coronary artery without angina pectoris: Secondary | ICD-10-CM

## 2018-09-16 DIAGNOSIS — I5032 Chronic diastolic (congestive) heart failure: Secondary | ICD-10-CM

## 2018-09-16 DIAGNOSIS — I10 Essential (primary) hypertension: Secondary | ICD-10-CM

## 2018-09-16 DIAGNOSIS — E119 Type 2 diabetes mellitus without complications: Secondary | ICD-10-CM | POA: Diagnosis not present

## 2018-09-16 DIAGNOSIS — I482 Chronic atrial fibrillation, unspecified: Secondary | ICD-10-CM

## 2018-09-16 DIAGNOSIS — I739 Peripheral vascular disease, unspecified: Secondary | ICD-10-CM

## 2018-09-16 DIAGNOSIS — Z79899 Other long term (current) drug therapy: Secondary | ICD-10-CM

## 2018-09-16 DIAGNOSIS — Z8673 Personal history of transient ischemic attack (TIA), and cerebral infarction without residual deficits: Secondary | ICD-10-CM | POA: Diagnosis not present

## 2018-09-16 DIAGNOSIS — E785 Hyperlipidemia, unspecified: Secondary | ICD-10-CM | POA: Diagnosis not present

## 2018-09-16 DIAGNOSIS — G4733 Obstructive sleep apnea (adult) (pediatric): Secondary | ICD-10-CM

## 2018-09-16 MED ORDER — METOLAZONE 2.5 MG PO TABS: ORAL_TABLET | ORAL | 3 refills | Status: DC

## 2018-09-16 NOTE — Progress Notes (Signed)
Cardiology Office Note    Date:  09/19/2018   ID:  AZREAL STTHOMAS, DOB 1956-06-25, MRN [id number]  PCP:  Patient, No Pcp Per  Cardiologist:  Dr. Gwenlyn Found  Chief Complaint  Patient presents with  . Follow-up    seen for Dr. Gwenlyn Found    History of Present Illness:  [name] is a 63 y.o. male with PMH ofCAD, hypertension, [facility], history of DVT,chronic atrial fibrillation on eliquis,DM 2, PAD, OSA on CPAP followed by neurologyand CVA. Patient had history of CAD dating back to 2002 and underwent back CABG x4 at the time. He also has peripheral neuropathy and peripheral arterial disease. He had history of left lower extremity stent in the past. He required above to below-knee bypass graft by Dr. Oneida Alar due to restenosis in the lower extremity artery. Myoview obtained on 02/07/2015 showed EF 52%, overall low risk study.In 2017, it was noted that his left femoral-popliteal bypass graft has occluded by angiography. He underwent redo composite vein left fem-tibial bypass graft. Due to recurrent stroke, he was diagnosed with atrial fibrillation and was started on Eliquis. A repeat echocardiogram on 01/31/2016 showed EF 45 to 50%, mild LVH. His last office visit with Dr. Gwenlyn Found was on 04/02/2017, at which time he was doing well.  I saw the patient in January 2024 weight gain, increased dyspnea and lower extremity edema.  I discontinued his lisinopril-hydrochlorothiazide combination and started him on lisinopril 20 mg daily by itself.  I also started him on Lasix 20 mg daily.  Repeat echocardiogram on 08/22/2018 showed EF of 60 to 65%, moderate LAE, mild aortic regurgitation.  I last saw the patient on 09/09/2018, he has New [place] Heart Association functional class III or IV dyspnea with exertion at the time.  He gained roughly 20 pounds of his baseline.  I discussed the case with Dr. Gwenlyn Found who also saw the patient.  We increased his Lasix to 40 mg twice daily and added every other  day of metolazone. I also added potassium supplement 20 mcg twice daily to his medical regimen as well.  Patient presents today for cardiology office visit.  After 3 doses of metolazone, he lost roughly 12 pounds.  He is breathing much better as well.  On physical exam, he no longer has any lower extremity edema.  I will scale back his metolazone to once weekly dosing.  He will need a basic metabolic panel next Friday and follow-up with Dr. Gwenlyn Found in 2 to 3 months.  He may take an additional dose of metolazone if his weight increased to 257 pounds or higher based on home scale.  His weight today is 251 pounds on home scale.   Past Medical History:  Diagnosis Date  . Arthritis    "hx; cleaned it out of both shoulders"  . CAD (coronary artery disease)    OV, Dr Harlow Asa, MYOVIEW 5/12 on chart  EKG 10/12 EP[facility],  chest x ray 01/07/11 [facility]  . Carpal tunnel syndrome    peripheral neuropathy  . Chronic shoulder pain    "both"  . DVT (deep venous thrombosis) (HCC)    hx LLE  . History of kidney stones   . Hyperlipidemia   . Hypertension   . Myocardial infarction ([place]) 02/2001  . Neuropathy, peripheral    both feet  . Peripheral vascular disease, unspecified ([place]) 03/2015   PCI to the right popliteal  . Pseudobulbar affect   . Skin cancer    "have had them  cut or burned off my face" (03/28/2015)  . Stroke ([place]) 10/10/2015  . Type II diabetes mellitus ([place])     Past Surgical History:  Procedure Laterality Date  . APPENDECTOMY  1977  . CARDIAC CATHETERIZATION  2002      . CARPAL TUNNEL RELEASE Right 2000's  . CARPAL TUNNEL RELEASE  12/18/2011   Procedure: CARPAL TUNNEL RELEASE;  Surgeon: Mcarthur Rossetti, MD;  Location: [facility];  Service: Orthopedics;  Laterality: Left;  Left Open Carpal Tunnel Release  . CORONARY ANGIOPLASTY    . CORONARY ARTERY BYPASS GRAFT  2002   CABG X 4  . CYSTOSCOPY  several done in past  . FEMORAL-TIBIAL BYPASS GRAFT Left 01/07/11   fem-posterior tibial BPG  using reversed left GSV               12/15/11 OK BY DR Ninfa Linden TO CONTINUE ASA AND PLAVIX  . FEMORAL-TIBIAL BYPASS GRAFT Left 09/06/2015   Procedure: LEFT FEMORAL-POSTERIOR TIBIAL ARTERY BYPASS GRAFT WITH COMPOSITE PTFE AND RIGHT ARM VEIN;  Surgeon: Elam Dutch, MD;  Location: [facility];  Service: Vascular;  Laterality: Left;  . FEMOROPOPLITEAL THROMBECTOMY / EMBOLECTOMY  ~ 2010  . FRACTURE SURGERY    . IR ANGIO INTRA EXTRACRAN SEL COM CAROTID INNOMINATE BILAT MOD SED  03/02/2017  . IR ANGIO VERTEBRAL SEL SUBCLAVIAN INNOMINATE UNI R MOD SED  08/16/2018  . IR ANGIO VERTEBRAL SEL VERTEBRAL UNI L MOD SED  03/02/2017  . IR ANGIO VERTEBRAL SEL VERTEBRAL UNI L MOD SED  08/16/2018  . IR ANGIOGRAM EXTREMITY RIGHT  03/02/2017  . IR GENERIC HISTORICAL  02/18/2016   IR RADIOLOGIST EVAL & MGMT 02/18/2016 [facility]  . IR GENERIC HISTORICAL  07/30/2016   IR ANGIO VERTEBRAL SEL VERTEBRAL UNI L MOD SED 07/30/2016 Luanne Bras, MD [facility]  . IR GENERIC HISTORICAL  07/30/2016   IR ANGIO INTRA EXTRACRAN SEL COM CAROTID INNOMINATE BILAT MOD SED 07/30/2016 Luanne Bras, MD [facility]  . IR US GUIDE VASC ACCESS RIGHT  08/16/2018  . KNEE ARTHROSCOPY Left X 2  . LAPAROSCOPIC CHOLECYSTECTOMY  1990's  . LITHOTRIPSY  several done in past  . ORIF RADIUS & ULNA FRACTURES Left   . PERIPHERAL VASCULAR CATHETERIZATION N/A 03/28/2015   Procedure: Lower Extremity Angiography;  Surgeon: Lorretta Harp, MD;  Location: [facility];  Service: Cardiovascular;  Laterality: N/A;  . PERIPHERAL VASCULAR CATHETERIZATION Right 03/28/2015   Procedure: Peripheral Vascular Atherectomy;  Surgeon: Lorretta Harp, MD;  Location: [facility];  Service: Cardiovascular;  Laterality: Right;  popliteal;   . PERIPHERAL VASCULAR CATHETERIZATION N/A 08/23/2015   Procedure: Abdominal Aortogram;  Surgeon: Elam Dutch, MD;  Location: [facility];  Service: Cardiovascular;  Laterality: N/A;  . POPLITEAL ARTERY STENT  Left 2010-2012 X 4  . RADIOLOGY WITH ANESTHESIA N/A 02/04/2016   Procedure: Basilar artery angioplasty with stenting;  Surgeon: Luanne Bras, MD;  Location: [facility];  Service: Radiology;  Laterality: N/A;  . SHOULDER ARTHROSCOPY Left   . SHOULDER ARTHROSCOPY Right 12/18/2011  . SHOULDER ARTHROSCOPY  07/08/2012   Procedure: ARTHROSCOPY SHOULDER;  Surgeon: Mcarthur Rossetti, MD;  Location: [facility];  Service: Orthopedics;  Laterality: Left;  Left Shoulder Arthroscopy with Manipulation and Extensive Debridement  . SHOULDER ARTHROSCOPY WITH ROTATOR CUFF REPAIR Left 07/14/2013   Procedure: LEFT SHOULDER ARTHROSCOPY WITH EXTENSIVE DEBRIDEMENT, DISTAL CLAVICLE REPAIR;  Surgeon: Mcarthur Rossetti, MD;  [facility];  Service: Orthopedics;  Laterality: Left;  .  SKIN CANCER EXCISION     "left side of my forehead"  . VEIN HARVEST Right 09/06/2015   Procedure: RIGHT ARM VEIN HARVEST;  Surgeon: Elam Dutch, MD;  [facility];  Service: Vascular;  Laterality: Right;    Current Medications: Outpatient Medications Prior to Visit  Medication Sig Dispense Refill  . apixaban (ELIQUIS) 5 MG TABS tablet Take 1 tablet (5 mg total) by mouth 2 (two) times daily. 60 tablet 10  . atorvastatin (LIPITOR) 40 MG tablet Take 40 mg by mouth daily.    . clopidogrel (PLAVIX) 75 MG tablet TAKE 1 TABLET BY MOUTH DAILY 90 tablet 0  . empagliflozin (JARDIANCE) 10 MG TABS tablet Take 10 mg by mouth daily.    . fenofibrate (TRICOR) 145 MG tablet TAKE 1 TABLET (145 MG TOTAL) BY MOUTH DAILY. PLEASE SCHEDULE APPOINTMENT FOR REFILLS. 90 tablet 2  . furosemide (LASIX) 40 MG tablet Take 1 tablet (40 mg total) by mouth 2 (two) times daily. 30 tablet 1  . gabapentin (NEURONTIN) 300 MG capsule Take 300 mg by mouth 3 (three) times daily.     . insulin NPH-regular Human (70-30) 100 UNIT/ML injection Inject 25-75 Units into the skin See admin instructions. 75 units with breakfast and dinner, and 25 units at lunch    .  lisinopril (PRINIVIL,ZESTRIL) 20 MG tablet Take 1 tablet (20 mg total) by mouth daily. 30 tablet 6  . metFORMIN (GLUCOPHAGE-XR) 500 MG 24 hr tablet Take 1 tablet (500 mg total) by mouth 2 (two) times daily.    . metoprolol succinate (TOPROL-XL) 25 MG 24 hr tablet TAKE 1 TABLET (25 MG TOTAL) BY MOUTH DAILY. *DUE 9/17* 90 tablet 1  . naproxen sodium (ALEVE) 220 MG tablet Take 220 mg by mouth daily.    . niacin 500 MG tablet Take 500 mg by mouth daily after supper.    . potassium chloride SA (KLOR-CON M20) 20 MEQ tablet Take 1 tablet (20 mEq total) by mouth 2 (two) times daily for 30 days. 30 tablet 1  . metolazone (ZAROXOLYN) 2.5 MG tablet Take 1 tablet every other day for 3 doses. 3 tablet 0   No facility-administered medications prior to visit.      Allergies:   Patient has no known allergies.   Social History   Socioeconomic History  . Marital status: Widowed    Spouse name: Not on file  . Number of children: Not on file  . Years of education: Not on file  . Highest education level: Not on file  Occupational History  . Not on file  Social Needs  . Financial resource strain: Not on file  . Food insecurity:    Worry: Not on file    Inability: Not on file  . Transportation needs:    Medical: Not on file    Non-medical: Not on file  Tobacco Use  . Smoking status: Never Smoker  . Smokeless tobacco: Never Used  Substance and Sexual Activity  . Alcohol use: No  . Drug use: No  . Sexual activity: Yes  Lifestyle  . Physical activity:    Days per week: Not on file    Minutes per session: Not on file  . Stress: Not on file  Relationships  . Social connections:    Talks on phone: Not on file    Gets together: Not on file    Attends religious service: Not on file    Active member of club or organization: Not on file  Attends meetings of clubs or organizations: Not on file    Relationship status: Not on file  Other Topics Concern  . Not on file  Social History Narrative  .  Not on file     Family History:  The patient's family history includes Cancer in his father and mother.   ROS:   Please see the history of present illness.    ROS All other systems reviewed and are negative.   PHYSICAL EXAM:   VS:  BP 118/76   Pulse 65   Ht 5\' 10"  (1.778 m)   Wt 257 lb 9.6 oz (116.8 kg)   BMI 36.96 kg/m    GEN: Well nourished, well developed, in no acute distress  HEENT: normal  Neck: no JVD, carotid bruits, or masses Cardiac: Irregularly irregular; no murmurs, rubs, or gallops,no edema  Respiratory:  clear to auscultation bilaterally, normal work of breathing GI: soft, nontender, nondistended, + BS MS: no deformity or atrophy  Skin: warm and dry, no rash Neuro:  Alert and Oriented x 3, Strength and sensation are intact Psych: euthymic mood, full affect  Wt Readings from Last 3 Encounters:  09/16/18 257 lb 9.6 oz (116.8 kg)  09/09/18 269 lb 3.2 oz (122.1 kg)  08/16/18 250 lb (113.4 kg)      Studies/Labs Reviewed:   EKG:  EKG is not ordered today.    Recent Labs: 08/09/2018: Hemoglobin 13.2; Platelets 263 09/14/2018: BUN 49; Creatinine, Ser 1.76; Potassium 4.5; Sodium 139   Lipid Panel    Component Value Date/Time   CHOL 523 (H) 01/29/2016 0538   TRIG 1,925 (H) 01/29/2016 0538   HDL NOT REPORTED DUE TO HIGH TRIGLYCERIDES 01/29/2016 0538   CHOLHDL NOT REPORTED DUE TO HIGH TRIGLYCERIDES 01/29/2016 0538   VLDL UNABLE TO CALCULATE IF TRIGLYCERIDE OVER 400 mg/dL 01/29/2016 0538   LDLCALC UNABLE TO CALCULATE IF TRIGLYCERIDE OVER 400 mg/dL 01/29/2016 0538   LDLDIRECT 87 01/16/2015 0813    Additional studies/ records that were reviewed today include:   Echo 08/22/2018 1. The left ventricle has normal systolic function of 03-50%. The cavity size is mildly increased. There is no left ventricular wall thickness. The left ventricular diastology could not be evaluated secondary to atrial fibrillation.  2. Moderately dilated left atrial size.  3. Normal right  atrial size.  4. Normal tricuspid valve.  5. The aortic valve tricuspid. There is mild thickening and moderate sclerosis of the aortic valve. Aortic valve regurgitation is mild by color flow Doppler.  6. No atrial level shunt detected by color flow Doppler.  7. The interatrial septum appears to be lipomatous.  8. The inferior vena cava was dilated in size with >50% respiratory variablity.     ASSESSMENT:    1. Chronic diastolic (congestive) heart failure ([place])   2. Medication management   3. Coronary artery disease involving native coronary artery of native heart without angina pectoris   4. Essential hypertension   5. Hyperlipidemia LDL goal <70   6. Atrial fibrillation, chronic   7. Controlled type 2 diabetes mellitus without complication, without long-term current use of insulin ([place])   8. PAD (peripheral artery disease) ([place])   9. OSA on CPAP   10. H/O: CVA (cerebrovascular accident)      PLAN:  In order of problems listed above:  1. Chronic diastolic heart failure: He has lost significant weight after recent diuresis.  I will switch him to once weekly dose of metolazone.  He will need 1 week basic metabolic  panel to check renal function and electrolyte  2. CAD: Denies any recent chest discomfort.  Recent echocardiogram showed a normal EF.  3. Hypertension: Blood pressure stable on current therapy  4. Hyperlipidemia: Continue Lipitor 40 mg daily.  5. Chronic atrial fibrillation: Rate controlled.  Continue on Eliquis  6. DM2: Managed by primary care provider  7. Obstructive sleep apnea: CPAP therapy managed by neurology  8. History of CVA: No recurrence    Medication Adjustments/Labs and Tests Ordered: Current medicines are reviewed at length with the patient today.  Concerns regarding medicines are outlined above.  Medication changes, Labs and Tests ordered today are listed in the Patient Instructions below. Patient Instructions  Medication Instructions:  Your  physician has recommended you make the following change in your medication:  1.  CHANGE the Metolazone to taking 2.5 mg once a week ON MONDAYS.  You may take 1 extra tablet if you reach 257 lbs on your home scale.  If you require more than 1 extra tablet in 1 week, call us before taking it.   If you need a refill on your cardiac medications before your next appointment, please call your pharmacy.   Lab work: 1 WEEK:  BMET  If you have labs (blood work) drawn today and your tests are completely normal, you will receive your results only by: [name] MyChart Message (if you have MyChart) OR . A paper copy in the mail If you have any lab test that is abnormal or we need to change your treatment, we will call you to review the results.  Testing/Procedures: None ordered  Follow-Up: Your physician recommends that you schedule a follow-up appointment in: 3 MONTHS WITH DR. Gwenlyn Found  Any Other Special Instructions Will Be Listed Below (If Applicable).       Hilbert Corrigan, [place]  09/19/2018 12:03 AM    [address], [place], [place]  [address] Phone: [phone]; Fax: [phone]

## 2018-09-16 NOTE — Patient Instructions (Signed)
Medication Instructions:  Your physician has recommended you make the following change in your medication:  1.  CHANGE the Metolazone to taking 2.5 mg once a week ON MONDAYS.  You may take 1 extra tablet if you reach 257 lbs on your home scale.  If you require more than 1 extra tablet in 1 week, call us before taking it.   If you need a refill on your cardiac medications before your next appointment, please call your pharmacy.   Lab work: 1 WEEK:  BMET  If you have labs (blood work) drawn today and your tests are completely normal, you will receive your results only by: [name] MyChart Message (if you have MyChart) OR . A paper copy in the mail If you have any lab test that is abnormal or we need to change your treatment, we will call you to review the results.  Testing/Procedures: None ordered  Follow-Up: Your physician recommends that you schedule a follow-up appointment in: 3 MONTHS WITH DR. Gwenlyn Found  Any Other Special Instructions Will Be Listed Below (If Applicable).

## 2018-09-18 DIAGNOSIS — R69 Illness, unspecified: Secondary | ICD-10-CM | POA: Diagnosis not present

## 2018-09-19 ENCOUNTER — Encounter: Payer: Self-pay | Admitting: Physician Assistant

## 2018-09-22 DIAGNOSIS — H524 Presbyopia: Secondary | ICD-10-CM | POA: Diagnosis not present

## 2018-09-22 DIAGNOSIS — E113293 Type 2 diabetes mellitus with mild nonproliferative diabetic retinopathy without macular edema, bilateral: Secondary | ICD-10-CM | POA: Diagnosis not present

## 2018-09-22 DIAGNOSIS — H5203 Hypermetropia, bilateral: Secondary | ICD-10-CM | POA: Diagnosis not present

## 2018-09-22 DIAGNOSIS — H25813 Combined forms of age-related cataract, bilateral: Secondary | ICD-10-CM | POA: Diagnosis not present

## 2018-09-25 ENCOUNTER — Other Ambulatory Visit: Payer: Self-pay | Admitting: Physician Assistant

## 2018-09-26 ENCOUNTER — Other Ambulatory Visit: Payer: Self-pay

## 2018-09-26 DIAGNOSIS — I10 Essential (primary) hypertension: Secondary | ICD-10-CM | POA: Diagnosis not present

## 2018-09-26 DIAGNOSIS — I639 Cerebral infarction, unspecified: Secondary | ICD-10-CM

## 2018-09-26 LAB — BASIC METABOLIC PANEL
BUN/Creatinine Ratio: 18 (ref 10–24)
BUN: 32 mg/dL — ABNORMAL HIGH (ref 8–27)
CO2: 24 mmol/L (ref 20–29)
CREATININE: 1.77 mg/dL — AB (ref 0.76–1.27)
Calcium: 10.6 mg/dL — ABNORMAL HIGH (ref 8.6–10.2)
Chloride: 101 mmol/L (ref 96–106)
GFR calc Af Amer: 46 mL/min/{1.73_m2} — ABNORMAL LOW (ref >59)
GFR, EST NON AFRICAN AMERICAN: 40 mL/min/{1.73_m2} — AB (ref >59)
Glucose: 175 mg/dL — ABNORMAL HIGH (ref 65–99)
Potassium: 4.9 mmol/L (ref 3.5–5.2)
Sodium: 143 mmol/L (ref 134–144)

## 2018-09-27 NOTE — Progress Notes (Signed)
The patient has been notified of the result and verbalized understanding.  All questions (if any) were answered. Jacqulynn Cadet, [place] 09/27/2018 5:07 PM

## 2018-10-02 ENCOUNTER — Other Ambulatory Visit: Payer: Self-pay | Admitting: Cardiovascular Disease

## 2018-10-04 ENCOUNTER — Other Ambulatory Visit: Payer: Self-pay | Admitting: Physician Assistant

## 2018-10-15 DIAGNOSIS — R69 Illness, unspecified: Secondary | ICD-10-CM | POA: Diagnosis not present

## 2018-11-10 DIAGNOSIS — R69 Illness, unspecified: Secondary | ICD-10-CM | POA: Diagnosis not present

## 2018-11-15 ENCOUNTER — Ambulatory Visit: Payer: BLUE CROSS/BLUE SHIELD | Admitting: Nurse Practitioner

## 2018-11-15 DIAGNOSIS — G4733 Obstructive sleep apnea (adult) (pediatric): Secondary | ICD-10-CM | POA: Diagnosis not present

## 2018-11-15 DIAGNOSIS — R69 Illness, unspecified: Secondary | ICD-10-CM | POA: Diagnosis not present

## 2018-11-16 ENCOUNTER — Other Ambulatory Visit: Payer: Self-pay | Admitting: Cardiovascular Disease

## 2018-11-16 NOTE — Telephone Encounter (Signed)
Metoprolol Succ 25 mg refilled.

## 2018-11-21 ENCOUNTER — Ambulatory Visit: Payer: Self-pay | Admitting: Neurology

## 2018-11-26 ENCOUNTER — Other Ambulatory Visit: Payer: Self-pay | Admitting: Physician Assistant

## 2018-11-27 ENCOUNTER — Other Ambulatory Visit: Payer: Self-pay | Admitting: Physician Assistant

## 2018-11-28 NOTE — Telephone Encounter (Signed)
Furosemide refilled. 

## 2018-11-28 NOTE — Telephone Encounter (Signed)
Metolazone and Klor-Con refilled.

## 2018-12-10 DIAGNOSIS — R69 Illness, unspecified: Secondary | ICD-10-CM | POA: Diagnosis not present

## 2018-12-19 DIAGNOSIS — G4733 Obstructive sleep apnea (adult) (pediatric): Secondary | ICD-10-CM | POA: Diagnosis not present

## 2018-12-20 ENCOUNTER — Ambulatory Visit: Payer: Medicare HMO | Admitting: Cardiovascular Disease

## 2018-12-28 ENCOUNTER — Other Ambulatory Visit: Payer: Self-pay | Admitting: Cardiovascular Disease

## 2019-01-04 ENCOUNTER — Encounter: Payer: Self-pay | Admitting: Cardiovascular Disease

## 2019-01-04 ENCOUNTER — Ambulatory Visit: Payer: Medicare HMO | Admitting: Cardiovascular Disease

## 2019-01-04 ENCOUNTER — Other Ambulatory Visit: Payer: Self-pay

## 2019-01-04 DIAGNOSIS — I2510 Atherosclerotic heart disease of native coronary artery without angina pectoris: Secondary | ICD-10-CM | POA: Diagnosis not present

## 2019-01-04 DIAGNOSIS — I480 Paroxysmal atrial fibrillation: Secondary | ICD-10-CM | POA: Diagnosis not present

## 2019-01-04 DIAGNOSIS — I739 Peripheral vascular disease, unspecified: Secondary | ICD-10-CM

## 2019-01-04 DIAGNOSIS — E782 Mixed hyperlipidemia: Secondary | ICD-10-CM

## 2019-01-04 DIAGNOSIS — R0609 Other forms of dyspnea: Secondary | ICD-10-CM

## 2019-01-04 DIAGNOSIS — I10 Essential (primary) hypertension: Secondary | ICD-10-CM

## 2019-01-04 DIAGNOSIS — R0600 Dyspnea, unspecified: Secondary | ICD-10-CM

## 2019-01-04 NOTE — Patient Instructions (Signed)
Medication Instructions:  Your physician recommends that you continue on your current medications as directed. Please refer to the Current Medication list given to you today.  If you need a refill on your cardiac medications before your next appointment, please call your pharmacy.   Lab work: Your physician recommends that you return for lab work in 1-2 weeks: lipid and liver panels  If you have labs (blood work) drawn today and your tests are completely normal, you will receive your results only by: [name] MyChart Message (if you have MyChart) OR . A paper copy in the mail If you have any lab test that is abnormal or we need to change your treatment, we will call you to review the results.  Testing/Procedures: none  Follow-Up: At [facility], you and your health needs are our priority.  As part of our continuing mission to provide you with exceptional heart care, we have created designated Provider Care Teams.  These Care Teams include your primary Cardiologist (physician) and Advanced Practice Providers (APPs -  Physician Assistants and Nurse Practitioners) who all work together to provide you with the care you need, when you need it. You will need a follow up appointment in 6 months with Almyra Deforest, PA-C and in 12 months with Dr. Gwenlyn Found.  Please call our office 2 months in advance to schedule this appointment.

## 2019-01-04 NOTE — Assessment & Plan Note (Signed)
History of CAD status post myocardial infarction back in 2002 with CABG x4 at that time.  He had cardiac catheterization for occluded grafts and EECP in the past.  He currently denies chest pain.  His last Myoview performed 02/07/2015 was nonischemic.

## 2019-01-04 NOTE — Addendum Note (Signed)
Addended by: Annita Brod on: 01/04/2019 12:14 PM   Modules accepted: Orders

## 2019-01-04 NOTE — Assessment & Plan Note (Signed)
History of essential hypertension with blood pressure measured today 140/68.  He is on metoprolol and lisinopril.

## 2019-01-04 NOTE — Assessment & Plan Note (Signed)
History of hyperlipidemia on statin therapy. We will recheck a lipid and liver profile 

## 2019-01-04 NOTE — Assessment & Plan Note (Signed)
History of paroxysmal atrial fibrillation maintaining sinus rhythm on Eliquis oral anticoagulation. 

## 2019-01-04 NOTE — Assessment & Plan Note (Signed)
History of dyspnea on exertion with diastolic heart failure which is chronic on twice daily Lasix and every other week Zaroxolyn.  He has no peripheral edema.  He does have moderate renal insufficiency.

## 2019-01-04 NOTE — Progress Notes (Signed)
01/04/2019 [name]   1956-05-16  [id number]  Primary Physician Patient, No Pcp Per Primary Cardiologist: [name] Harp MD [name], [place]  HPI:  [name] is a 63 y.o.  moderately overweight, widowed Caucasian male, father of 57, grandfather to 3 grandchildren who has a history of ischemic heart disease status post MI back in 2002 with bypass grafting x4 at that time. I last saw him in the office 04/02/2017.  He has seen [name] Deforest PA-C since that time.[name] KitchenHe has had occluded graft by cath and has had EECP in the past. His other problems include remote tobacco abuse, diabetes with peripheral neuropathy, dyslipidemia and hypertension. He also has peripheral vascular occlusive disease. I stented his left lower extremity several times, the most recent with a Viabahn endoprosthesis with an excellent result. This ultimated closed probably because of his failure to continue his Plavix. He ultimately underwent above-to-below-the-knee bypass grafting by [name] which eventually closed as well. Dr. Oneida [name] recently did Dopplers on him revealing a right ABI of 1.03 and a left of 0.66. His last Myoview performed Dec 12, 2011, revealed inferolateral thinning towards the apex. He is otherwise asymptomatic.his lipids are followed by [name], his endocrinologist. He did have a negative Myoview in August of last year prior to orthopedic surgery. He denies chest pain but does complain of increasing dyspnea on exertion. Recent lower extremity Dopplers performed by Dr. Oneida [name] revealed a right ABI of 0.76 and a left of 0.61. He does complain of bilateral lower extremity lifestyle limiting claudication. Recent Dopplers performed 02/05/15 revealed a right ABI 0.79 with a high frequency signal in the right popliteal artery and one-vessel runoff, left ABI of 0.66 with an occluded popliteal artery. A 2-D echo and Myoview stress test were unrevealing. I performed angiography on him 03/28/15 revealing an  occluded left SFA with a patent left phlegm to posterior tibial bypass. He had an 80% stenosis in the P1 segment of the right lower extremity with two-vessel runoff (occluded peroneal). I performed [facility] one directional atherectomy followed by drug eluding balloon angioplasty with excellent angiographic and clinical result. His Dopplers improved and his claudication resolved. He saw Dr. Oneida [name] after my last office visit at that time was noted that his left femoropopliteal bypass graft had occluded by angiography. He underwent redo composite vein/PTFE left fem-tib bypass grafting. He had a stroke back in March and a subsequent stroke last month. He was noted to be in paroxysmal A. fib and was started on Eliquis. He also underwent basilar artery stenting by Dr. Patrecia [name]. He is supposed to start drug rehabilitation. He also complains of increasing dyspnea on exertion since his stroke. A 2-D echo reveals slight decline in his EF down to the 45-50% range. Since I saw him in the office a year ago he's done well. He denies chest pain or shortness of breath. He is followed as an outpatient additionally by Dr. Michiel [name] for his diabetes and hyperlipidemia as well as Dr. Oneida [name] and Dr. Patrecia [name].  Since I saw him 2 years ago he continues to do well.  He was begun on diuretics for diastolic heart failure with preserved LV function.  Lower extremity Dopplers performed last year by Dr. Oneida [name] revealed his left femoropopliteal bypass graft to be patent with a patent right popliteal artery as well.  He denies claudication.   Current Meds  Medication Sig   apixaban (ELIQUIS) 5 MG TABS tablet Take 1 tablet (5 mg total) by  mouth 2 (two) times daily.   atorvastatin (LIPITOR) 40 MG tablet Take 40 mg by mouth daily.   clopidogrel (PLAVIX) 75 MG tablet TAKE 1 TABLET BY MOUTH EVERY DAY   empagliflozin (JARDIANCE) 10 MG TABS tablet Take 10 mg by mouth daily.   fenofibrate (TRICOR) 145 MG tablet TAKE 1 TABLET (145 MG TOTAL)  BY MOUTH DAILY. PLEASE SCHEDULE APPOINTMENT FOR REFILLS.   furosemide (LASIX) 40 MG tablet TAKE 1 TABLET BY MOUTH TWICE A DAY   gabapentin (NEURONTIN) 300 MG capsule Take 300 mg by mouth 3 (three) times daily.    insulin NPH-regular Human (70-30) 100 UNIT/ML injection Inject 25-75 Units into the skin See admin instructions. 75 units with breakfast and dinner, and 25 units at lunch   KLOR-CON M20 20 MEQ tablet TAKE 1 TABLET BY MOUTH TWICE A DAY FOR 30 DAYS   metFORMIN (GLUCOPHAGE-XR) 500 MG 24 hr tablet Take 1 tablet (500 mg total) by mouth 2 (two) times daily.   metolazone (ZAROXOLYN) 2.5 MG tablet TAKE 1 TABLET ONCE A WEEK ON MONDAYS, MAY TAKE AN EXTRA TABLET IF WT REACHES 257LBS   metoprolol succinate (TOPROL-XL) 25 MG 24 hr tablet TAKE 1 TABLET (25 MG TOTAL) BY MOUTH DAILY. *DUE 9/17*   naproxen sodium (ALEVE) 220 MG tablet Take 220 mg by mouth daily.   niacin 500 MG tablet Take 500 mg by mouth daily after supper.     No Known Allergies  Social History   Socioeconomic History   Marital status: Widowed    Spouse name: Not on file   Number of children: Not on file   Years of education: Not on file   Highest education level: Not on file  Occupational History   Not on file  Social Needs   Financial resource strain: Not on file   Food insecurity    Worry: Not on file    Inability: Not on file   Transportation needs    Medical: Not on file    Non-medical: Not on file  Tobacco Use   Smoking status: Never Smoker   Smokeless tobacco: Never Used  Substance and Sexual Activity   Alcohol use: No   Drug use: No   Sexual activity: Yes  Lifestyle   Physical activity    Days per week: Not on file    Minutes per session: Not on file   Stress: Not on file  Relationships   Social connections    Talks on phone: Not on file    Gets together: Not on file    Attends religious service: Not on file    Active member of club or organization: Not on file    Attends  meetings of clubs or organizations: Not on file    Relationship status: Not on file   Intimate partner violence    Fear of current or ex partner: Not on file    Emotionally abused: Not on file    Physically abused: Not on file    Forced sexual activity: Not on file  Other Topics Concern   Not on file  Social History Narrative   Not on file     Review of Systems: General: negative for chills, fever, night sweats or weight changes.  Cardiovascular: negative for chest pain, dyspnea on exertion, edema, orthopnea, palpitations, paroxysmal nocturnal dyspnea or shortness of breath Dermatological: negative for rash Respiratory: negative for cough or wheezing Urologic: negative for hematuria Abdominal: negative for nausea, vomiting, diarrhea, bright red blood per rectum, melena, or  hematemesis Neurologic: negative for visual changes, syncope, or dizziness All other systems reviewed and are otherwise negative except as noted above.    Blood pressure 140/68, pulse (!) 56, temperature 98.4 F (36.9 C), height 5\' 10"  (1.778 m), weight 266 lb (120.7 kg).  General appearance: alert and no distress Neck: no adenopathy, no carotid bruit, no JVD, supple, symmetrical, trachea midline and thyroid not enlarged, symmetric, no tenderness/mass/nodules Lungs: clear to auscultation bilaterally Heart: regular rate and rhythm, S1, S2 normal, no murmur, click, rub or gallop Extremities: extremities normal, atraumatic, no cyanosis or edema Pulses: 2+ and symmetric Skin: Skin color, texture, turgor normal. No rashes or lesions Neurologic: Alert and oriented X 3, normal strength and tone. Normal symmetric reflexes. Normal coordination and gait  EKG not performed today  ASSESSMENT AND PLAN:   Coronary artery disease, history of CABG History of CAD status post myocardial infarction back in 2002 with CABG x4 at that time.  He had cardiac catheterization for occluded grafts and EECP in the past.  He  currently denies chest pain.  His last Myoview performed 02/07/2015 was nonischemic.  PAD (peripheral artery disease) (HCC) History of PAD status post left above-the-knee to below-the-knee bypass grafting by Dr. Oneida [name] in the past.  Dopplers performed last year revealed this to be widely patent.  I have also performed [facility] 1 directional atherectomy followed by drug-coated balloon angioplasty on his right popliteal artery 03/28/2015 with Dopplers revealing this to be widely patent.  He denies claudication.  Essential hypertension History of essential hypertension with blood pressure measured today 140/68.  He is on metoprolol and lisinopril.  Hyperlipidemia History of hyperlipidemia on statin therapy.  We will recheck a lipid and liver profile  Dyspnea on exertion History of dyspnea on exertion with diastolic heart failure which is chronic on twice daily Lasix and every other week Zaroxolyn.  He has no peripheral edema.  He does have moderate renal insufficiency.  Paroxysmal atrial fibrillation (HCC) History of paroxysmal atrial fibrillation maintaining sinus rhythm on Eliquis oral anticoagulation.      [name] Harp MD FACP,FACC,FAHA, [facility] 01/04/2019 12:04 PM

## 2019-01-04 NOTE — Assessment & Plan Note (Signed)
History of PAD status post left above-the-knee to below-the-knee bypass grafting by Dr. Oneida Alar in the past.  Dopplers performed last year revealed this to be widely patent.  I have also performed [facility] 1 directional atherectomy followed by drug-coated balloon angioplasty on his right popliteal artery 03/28/2015 with Dopplers revealing this to be widely patent.  He denies claudication.

## 2019-01-08 DIAGNOSIS — R69 Illness, unspecified: Secondary | ICD-10-CM | POA: Diagnosis not present

## 2019-01-09 DIAGNOSIS — E782 Mixed hyperlipidemia: Secondary | ICD-10-CM | POA: Diagnosis not present

## 2019-01-09 LAB — HEPATIC FUNCTION PANEL
ALT: 15 IU/L (ref 0–44)
AST: 13 IU/L (ref 0–40)
Albumin: 4.1 g/dL (ref 3.8–4.8)
Alkaline Phosphatase: 49 IU/L (ref 39–117)
Bilirubin Total: 0.3 mg/dL (ref 0.0–1.2)
Bilirubin, Direct: 0.12 mg/dL (ref 0.00–0.40)
Total Protein: 6.8 g/dL (ref 6.0–8.5)

## 2019-01-09 LAB — LIPID PANEL
Chol/HDL Ratio: 6.6 ratio — ABNORMAL HIGH (ref 0.0–5.0)
Cholesterol, Total: 231 mg/dL — ABNORMAL HIGH (ref 100–199)
HDL: 35 mg/dL — ABNORMAL LOW (ref >39)
Triglycerides: 406 mg/dL — ABNORMAL HIGH (ref 0–149)

## 2019-01-17 ENCOUNTER — Telehealth: Payer: Self-pay | Admitting: Internal Medicine

## 2019-01-17 ENCOUNTER — Other Ambulatory Visit: Payer: Self-pay

## 2019-01-17 DIAGNOSIS — E781 Pure hyperglyceridemia: Secondary | ICD-10-CM

## 2019-01-17 NOTE — Telephone Encounter (Signed)
LVM for patient to schedule appt with Dr. Debara Pickett on a Lipid day for management of hypertriglceridemia.  Appointment request via staff message sent to [facility] Scheduling pool 01/17/19

## 2019-02-05 DIAGNOSIS — R69 Illness, unspecified: Secondary | ICD-10-CM | POA: Diagnosis not present

## 2019-02-14 ENCOUNTER — Telehealth: Payer: Self-pay | Admitting: Internal Medicine

## 2019-02-14 DIAGNOSIS — R69 Illness, unspecified: Secondary | ICD-10-CM | POA: Diagnosis not present

## 2019-02-14 NOTE — Telephone Encounter (Signed)

## 2019-02-15 ENCOUNTER — Telehealth (INDEPENDENT_AMBULATORY_CARE_PROVIDER_SITE_OTHER): Payer: Medicare HMO | Admitting: Internal Medicine

## 2019-02-15 ENCOUNTER — Encounter: Payer: Self-pay | Admitting: Internal Medicine

## 2019-02-15 VITALS — Ht 71.0 in | Wt 267.0 lb

## 2019-02-15 DIAGNOSIS — E781 Pure hyperglyceridemia: Secondary | ICD-10-CM | POA: Diagnosis not present

## 2019-02-15 DIAGNOSIS — Z8673 Personal history of transient ischemic attack (TIA), and cerebral infarction without residual deficits: Secondary | ICD-10-CM

## 2019-02-15 DIAGNOSIS — I2510 Atherosclerotic heart disease of native coronary artery without angina pectoris: Secondary | ICD-10-CM

## 2019-02-15 DIAGNOSIS — I739 Peripheral vascular disease, unspecified: Secondary | ICD-10-CM

## 2019-02-15 DIAGNOSIS — E785 Hyperlipidemia, unspecified: Secondary | ICD-10-CM

## 2019-02-15 MED ORDER — ICOSAPENT ETHYL 1 G PO CAPS: 2.0000 g | ORAL_CAPSULE | Freq: Two times a day (BID) | ORAL | 11 refills | Status: DC

## 2019-02-15 NOTE — Progress Notes (Signed)
Virtual Visit via Telephone Note   This visit type was conducted due to national recommendations for restrictions regarding the COVID-19 Pandemic (e.g. social distancing) in an effort to limit this patient's exposure and mitigate transmission in our community.  Due to his co-morbid illnesses, this patient is at least at moderate risk for complications without adequate follow up.  This format is felt to be most appropriate for this patient at this time.  The patient did not have access to video technology/had technical difficulties with video requiring transitioning to audio format only (telephone).  All issues noted in this document were discussed and addressed.  No physical exam could be performed with this format.  Please refer to the patient's chart for his  consent to telehealth for [facility].   Evaluation Performed:  Telephone visit  Date:  02/15/2019   ID:  [name], DOB 18-Oct-1955, MRN [id number]  Patient Location:  [place] [address]  Provider location:   [address], [address] [place], [place] [address]  PCP:  Patient, No Pcp Per  Cardiologist:  Quay Burow, MD Electrophysiologist:  None   Chief Complaint:  Manage dyslipidemia  History of Present Illness:    [name] is a 63 y.o. male who presents via audio/video conferencing for a telehealth visit today.  [name] is seen today in lipid clinic for follow-up and management of dyslipidemia.  This is a 63 year old patient of Dr. Alvester Chou with a history of coronary disease, peripheral arterial disease and multiple prior strokes.  He has dyslipidemia but has not managed to get to goal LDL less than 70.  His last lipid profile showed total cholesterol 231, triglycerides 406, HDL 35 the LDL could not be calculated.  3 years ago however his triglycerides were markedly elevated in the 1900 range.  He was previously seeing Dr. Lavetta Nielsen at [facility] was managing his triglycerides.  Therefore he has had  marked improvement in his triglycerides, possibly from better treatment of his diabetes.  He is on insulin.  He also takes fenofibrate 145 mg daily and atorvastatin 40 mg daily.  The patient does not have symptoms concerning for COVID-19 infection (fever, chills, cough, or new SHORTNESS OF BREATH).    Prior CV studies:   The following studies were reviewed today:  Chart reviewed Lab work  PMHx:  Past Medical History:  Diagnosis Date   Arthritis    "hx; cleaned it out of both shoulders"   CAD (coronary artery disease)    OV, Dr Harlow Asa, MYOVIEW 5/12 on chart  EKG 10/12 EPIC,  chest x ray 01/07/11 EPIC   Carpal tunnel syndrome    peripheral neuropathy   Chronic shoulder pain    "both"   DVT (deep venous thrombosis) (HCC)    hx LLE   History of kidney stones    Hyperlipidemia    Hypertension    Myocardial infarction ([place]) 02/2001   Neuropathy, peripheral    both feet   Peripheral vascular disease, unspecified ([place]) 03/2015   PCI to the right popliteal   Pseudobulbar affect    Skin cancer    "have had them cut or burned off my face" (03/28/2015)   Stroke ([place]) 10/10/2015   Type II diabetes mellitus ([place])     Past Surgical History:  Procedure Laterality Date   APPENDECTOMY  1977   CARDIAC CATHETERIZATION  2002       CARPAL TUNNEL RELEASE Right 2000's   CARPAL TUNNEL RELEASE  12/18/2011   Procedure: CARPAL TUNNEL RELEASE;  Surgeon: Mcarthur Rossetti, MD;  Location: [facility];  Service: Orthopedics;  Laterality: Left;  Left Open Carpal Tunnel Release   CORONARY ANGIOPLASTY     CORONARY ARTERY BYPASS GRAFT  2002   CABG X 4   CYSTOSCOPY  several done in past   FEMORAL-TIBIAL BYPASS GRAFT Left 01/07/11   fem-posterior tibial BPG using reversed left GSV               12/15/11 OK BY DR Ninfa Linden TO CONTINUE ASA AND PLAVIX   FEMORAL-TIBIAL BYPASS GRAFT Left 09/06/2015   Procedure: LEFT FEMORAL-POSTERIOR TIBIAL ARTERY BYPASS GRAFT WITH COMPOSITE PTFE AND  RIGHT ARM VEIN;  Surgeon: Elam Dutch, MD;  Location: [facility];  Service: Vascular;  Laterality: Left;   FEMOROPOPLITEAL THROMBECTOMY / EMBOLECTOMY  ~ 2010   FRACTURE SURGERY     IR ANGIO INTRA EXTRACRAN SEL COM CAROTID INNOMINATE BILAT MOD SED  03/02/2017   IR ANGIO VERTEBRAL SEL SUBCLAVIAN INNOMINATE UNI R MOD SED  08/16/2018   IR ANGIO VERTEBRAL SEL VERTEBRAL UNI L MOD SED  03/02/2017   IR ANGIO VERTEBRAL SEL VERTEBRAL UNI L MOD SED  08/16/2018   IR ANGIOGRAM EXTREMITY RIGHT  03/02/2017   IR GENERIC HISTORICAL  02/18/2016   IR RADIOLOGIST EVAL & MGMT 02/18/2016 [facility]   IR GENERIC HISTORICAL  07/30/2016   IR ANGIO VERTEBRAL SEL VERTEBRAL UNI L MOD SED 07/30/2016 Luanne Bras, MD [facility]   IR GENERIC HISTORICAL  07/30/2016   IR ANGIO INTRA EXTRACRAN SEL COM CAROTID INNOMINATE BILAT MOD SED 07/30/2016 Luanne Bras, MD [facility]   IR US GUIDE VASC ACCESS RIGHT  08/16/2018   KNEE ARTHROSCOPY Left X 2   LAPAROSCOPIC CHOLECYSTECTOMY  1990's   LITHOTRIPSY  several done in past   ORIF RADIUS & ULNA FRACTURES Left    PERIPHERAL VASCULAR CATHETERIZATION N/A 03/28/2015   Procedure: Lower Extremity Angiography;  Surgeon: Lorretta Harp, MD;  Location: [facility];  Service: Cardiovascular;  Laterality: N/A;   PERIPHERAL VASCULAR CATHETERIZATION Right 03/28/2015   Procedure: Peripheral Vascular Atherectomy;  Surgeon: Lorretta Harp, MD;  Location: [facility];  Service: Cardiovascular;  Laterality: Right;  popliteal;    PERIPHERAL VASCULAR CATHETERIZATION N/A 08/23/2015   Procedure: Abdominal Aortogram;  Surgeon: Elam Dutch, MD;  Location: [facility];  Service: Cardiovascular;  Laterality: N/A;   POPLITEAL ARTERY STENT Left 2010-2012 X 4   RADIOLOGY WITH ANESTHESIA N/A 02/04/2016   Procedure: Basilar artery angioplasty with stenting;  Surgeon: Luanne Bras, MD;  Location: [facility];  Service: Radiology;  Laterality: N/A;   SHOULDER  ARTHROSCOPY Left    SHOULDER ARTHROSCOPY Right 12/18/2011   SHOULDER ARTHROSCOPY  07/08/2012   Procedure: ARTHROSCOPY SHOULDER;  Surgeon: Mcarthur Rossetti, MD;  [facility];  Service: Orthopedics;  Laterality: Left;  Left Shoulder Arthroscopy with Manipulation and Extensive Debridement   SHOULDER ARTHROSCOPY WITH ROTATOR CUFF REPAIR Left 07/14/2013   Procedure: LEFT SHOULDER ARTHROSCOPY WITH EXTENSIVE DEBRIDEMENT, DISTAL CLAVICLE REPAIR;  Surgeon: Mcarthur Rossetti, MD;  [facility];  Service: Orthopedics;  Laterality: Left;   SKIN CANCER EXCISION     "left side of my forehead"   VEIN HARVEST Right 09/06/2015   Procedure: RIGHT ARM VEIN HARVEST;  Surgeon: Elam Dutch, MD;  Location: [facility] OR;  Service: Vascular;  Laterality: Right;    FAMHx:  Family History  Problem Relation Age of Onset  Cancer Mother        Breast and Brain tumor   Cancer Father        Blood vessel tumor    SOCHx:   reports that he has never smoked. He has never used smokeless tobacco. He reports that he does not drink alcohol or use drugs.  ALLERGIES:  No Known Allergies  MEDS:  Current Meds  Medication Sig   apixaban (ELIQUIS) 5 MG TABS tablet Take 1 tablet (5 mg total) by mouth 2 (two) times daily.   atorvastatin (LIPITOR) 40 MG tablet Take 40 mg by mouth daily.   clopidogrel (PLAVIX) 75 MG tablet TAKE 1 TABLET BY MOUTH EVERY DAY   empagliflozin (JARDIANCE) 10 MG TABS tablet Take 10 mg by mouth daily.   fenofibrate (TRICOR) 145 MG tablet TAKE 1 TABLET (145 MG TOTAL) BY MOUTH DAILY. PLEASE SCHEDULE APPOINTMENT FOR REFILLS.   furosemide (LASIX) 40 MG tablet TAKE 1 TABLET BY MOUTH TWICE A DAY   gabapentin (NEURONTIN) 300 MG capsule Take 300 mg by mouth 3 (three) times daily.    insulin NPH-regular Human (70-30) 100 UNIT/ML injection Inject 25-75 Units into the skin See admin instructions. 75 units with breakfast and dinner, and 25 units at lunch   KLOR-CON M20 20 MEQ  tablet TAKE 1 TABLET BY MOUTH TWICE A DAY FOR 30 DAYS   metFORMIN (GLUCOPHAGE-XR) 500 MG 24 hr tablet Take 1 tablet (500 mg total) by mouth 2 (two) times daily.   metolazone (ZAROXOLYN) 2.5 MG tablet TAKE 1 TABLET ONCE A WEEK ON MONDAYS, MAY TAKE AN EXTRA TABLET IF WT REACHES 257LBS   metoprolol succinate (TOPROL-XL) 25 MG 24 hr tablet TAKE 1 TABLET (25 MG TOTAL) BY MOUTH DAILY. *DUE 9/17*   naproxen sodium (ALEVE) 220 MG tablet Take 220 mg by mouth daily.   niacin 500 MG tablet Take 500 mg by mouth daily after supper.     ROS: Pertinent items noted in HPI and remainder of comprehensive ROS otherwise negative.  Labs/Other Tests and Data Reviewed:    Recent Labs: 08/09/2018: Hemoglobin 13.2; Platelets 263 09/26/2018: BUN 32; Creatinine, Ser 1.77; Potassium 4.9; Sodium 143 01/09/2019: ALT 15   Recent Lipid Panel Lab Results  Component Value Date/Time   CHOL 231 (H) 01/09/2019 08:18 AM   TRIG 406 (H) 01/09/2019 08:18 AM   HDL 35 (L) 01/09/2019 08:18 AM   CHOLHDL 6.6 (H) 01/09/2019 08:18 AM   CHOLHDL NOT REPORTED DUE TO HIGH TRIGLYCERIDES 01/29/2016 05:38 AM   LDLCALC Comment 01/09/2019 08:18 AM   LDLDIRECT 87 01/16/2015 08:13 AM    Wt Readings from Last 3 Encounters:  02/15/19 267 lb (121.1 kg)  01/04/19 266 lb (120.7 kg)  09/16/18 257 lb 9.6 oz (116.8 kg)     Exam:    Vital Signs:  Ht 5\' 11"  (1.803 m)    Wt 267 lb (121.1 kg)    BMI 37.24 kg/m    Exam not performed due to telephone visit  ASSESSMENT & PLAN:    1. Mixed dyslipidemia, goal LDL less than 70 2. PAD 3. CAD 4. Prior stroke 5. Type 2 diabetes on insulin  [name] has multiple cardiovascular risk factors and diffuse ASCVD.  Based on this his goal LDL is less than 70 however triglycerides also remain elevated.  I think is a good candidate to add Vascepa 2 g twice daily.  He should remain on his statin, fibrate, and low-dose niacin.  We will plan to repeat lipid profile including an  LP(a) in 3  months.  COVID-19 Education: The signs and symptoms of COVID-19 were discussed with the patient and how to seek care for testing (follow up with PCP or arrange E-visit).  The importance of social distancing was discussed today.   Patient Risk:   After full review of this patients clinical status, I feel that they are at least moderate risk at this time.  Time:     Today, I have spent 25 minutes with the patient with telehealth technology discussing dyslipidemia, PAD, CAD, diabetes control, prior strokes.     Medication Adjustments/Labs and Tests Ordered: Current medicines are reviewed at length with the patient today.  Concerns regarding medicines are outlined above.   Tests Ordered: Orders Placed This Encounter  Procedures   Lipid panel   LDL cholesterol, direct    Medication Changes: Meds ordered this encounter  Medications   Icosapent Ethyl 1 g CAPS    Sig: Take 2 capsules (2 g total) by mouth 2 (two) times a day.    Dispense:  120 capsule    Refill:  11    Disposition:  in 3 month(s)  Pixie Casino, MD, [facility], [place] Director of the Advanced Lipid Disorders &  Cardiovascular Risk Reduction Clinic Diplomate of the American Board of Clinical Lipidology Attending Cardiologist  Direct Dial: [phone]   Fax: [phone]  Website:  [email or web address]  Pixie Casino, MD  02/15/2019 9:52 PM

## 2019-02-15 NOTE — Patient Instructions (Signed)
Medication Instructions:  START vascepa 2 gram twice daily (2 capsules in AM, 2 capsules in PM)  If you need a refill on your cardiac medications before your next appointment, please call your pharmacy.   Lab work: FASTING lab work to check cholesterol in 3 months If you have labs (blood work) drawn today and your tests are completely normal, you will receive your results only by: [name] MyChart Message (if you have MyChart) OR . A paper copy in the mail If you have any lab test that is abnormal or we need to change your treatment, we will call you to review the results.  Testing/Procedures: NONE  Follow-Up: Dr. Debara Pickett recommends that you schedule a follow up visit with him the in the [place] in 3 months. Please have fasting blood work about 1 week prior to this visit and he will review the blood work results with you at your appointment.

## 2019-02-17 ENCOUNTER — Other Ambulatory Visit: Payer: Self-pay | Admitting: Physician Assistant

## 2019-02-21 ENCOUNTER — Other Ambulatory Visit: Payer: Self-pay | Admitting: Physician Assistant

## 2019-02-27 DIAGNOSIS — E1165 Type 2 diabetes mellitus with hyperglycemia: Secondary | ICD-10-CM | POA: Diagnosis not present

## 2019-02-27 DIAGNOSIS — E1121 Type 2 diabetes mellitus with diabetic nephropathy: Secondary | ICD-10-CM | POA: Diagnosis not present

## 2019-02-27 DIAGNOSIS — E7800 Pure hypercholesterolemia, unspecified: Secondary | ICD-10-CM | POA: Diagnosis not present

## 2019-02-27 DIAGNOSIS — E1140 Type 2 diabetes mellitus with diabetic neuropathy, unspecified: Secondary | ICD-10-CM | POA: Diagnosis not present

## 2019-02-27 DIAGNOSIS — I10 Essential (primary) hypertension: Secondary | ICD-10-CM | POA: Diagnosis not present

## 2019-03-01 ENCOUNTER — Other Ambulatory Visit: Payer: Self-pay | Admitting: Physician Assistant

## 2019-03-08 DIAGNOSIS — R69 Illness, unspecified: Secondary | ICD-10-CM | POA: Diagnosis not present

## 2019-03-19 DIAGNOSIS — R69 Illness, unspecified: Secondary | ICD-10-CM | POA: Diagnosis not present

## 2019-03-22 DIAGNOSIS — E113293 Type 2 diabetes mellitus with mild nonproliferative diabetic retinopathy without macular edema, bilateral: Secondary | ICD-10-CM | POA: Diagnosis not present

## 2019-03-22 DIAGNOSIS — H25813 Combined forms of age-related cataract, bilateral: Secondary | ICD-10-CM | POA: Diagnosis not present

## 2019-03-22 DIAGNOSIS — G4733 Obstructive sleep apnea (adult) (pediatric): Secondary | ICD-10-CM | POA: Diagnosis not present

## 2019-04-05 DIAGNOSIS — R69 Illness, unspecified: Secondary | ICD-10-CM | POA: Diagnosis not present

## 2019-04-09 DIAGNOSIS — R69 Illness, unspecified: Secondary | ICD-10-CM | POA: Diagnosis not present

## 2019-05-08 ENCOUNTER — Other Ambulatory Visit: Payer: Self-pay

## 2019-05-08 ENCOUNTER — Ambulatory Visit: Payer: Medicare HMO | Admitting: Neurology

## 2019-05-08 ENCOUNTER — Encounter: Payer: Self-pay | Admitting: Neurology

## 2019-05-08 VITALS — BP 151/62 | Temp 97.7°F | Ht 70.0 in | Wt 272.0 lb

## 2019-05-08 DIAGNOSIS — I6990 Unspecified sequelae of unspecified cerebrovascular disease: Secondary | ICD-10-CM | POA: Diagnosis not present

## 2019-05-08 DIAGNOSIS — R69 Illness, unspecified: Secondary | ICD-10-CM | POA: Diagnosis not present

## 2019-05-08 NOTE — Patient Instructions (Signed)
I had a long d/w patient about his remote  stroke, basilar stenosis and stenting, atrial fibrillation risk for recurrent stroke/TIAs, personally independently reviewed imaging studies and stroke evaluation results and answered questions.Continue Plavix 75 mg daily for basilar stenting and Eliquis for atrial fibrillation for secondary stroke prevention and maintain strict control of hypertension with blood pressure goal below 130/90, diabetes with hemoglobin A1c goal below 6.5% and lipids with LDL cholesterol goal below 70 mg/dL. I also advised the patient to eat a healthy diet with plenty of whole grains, cereals, fruits and vegetables, exercise regularly and maintain ideal body weight .I also advised him to be compliant with using his CPAP every night for his sleep apnea.  Followup in the future with my nurse practitioner Janett Billow in a year or call earlier if necessary.  Stroke Prevention Some medical conditions and behaviors are associated with a higher chance of having a stroke. You can help prevent a stroke by making nutrition, lifestyle, and other changes, including managing any medical conditions you may have. What nutrition changes can be made?   Eat healthy foods. You can do this by: ? Choosing foods high in fiber, such as fresh fruits and vegetables and whole grains. ? Eating at least 5 or more servings of fruits and vegetables a day. Try to fill half of your plate at each meal with fruits and vegetables. ? Choosing lean protein foods, such as lean cuts of meat, poultry without skin, fish, tofu, beans, and nuts. ? Eating low-fat dairy products. ? Avoiding foods that are high in salt (sodium). This can help lower blood pressure. ? Avoiding foods that have saturated fat, trans fat, and cholesterol. This can help prevent high cholesterol. ? Avoiding processed and premade foods.  Follow your health care provider's specific guidelines for losing weight, controlling high blood pressure  (hypertension), lowering high cholesterol, and managing diabetes. These may include: ? Reducing your daily calorie intake. ? Limiting your daily sodium intake to 1,500 milligrams (mg). ? Using only healthy fats for cooking, such as olive oil, canola oil, or sunflower oil. ? Counting your daily carbohydrate intake. What lifestyle changes can be made?  Maintain a healthy weight. Talk to your health care provider about your ideal weight.  Get at least 30 minutes of moderate physical activity at least 5 days a week. Moderate activity includes brisk walking, biking, and swimming.  Do not use any products that contain nicotine or tobacco, such as cigarettes and e-cigarettes. If you need help quitting, ask your health care provider. It may also be helpful to avoid exposure to secondhand smoke.  Limit alcohol intake to no more than 1 drink a day for nonpregnant women and 2 drinks a day for men. One drink equals 12 oz of beer, 5 oz of wine, or 1 oz of hard liquor.  Stop any illegal drug use.  Avoid taking birth control pills. Talk to your health care provider about the risks of taking birth control pills if: ? You are over 2 years old. ? You smoke. ? You get migraines. ? You have ever had a blood clot. What other changes can be made?  Manage your cholesterol levels. ? Eating a healthy diet is important for preventing high cholesterol. If cholesterol cannot be managed through diet alone, you may also need to take medicines. ? Take any prescribed medicines to control your cholesterol as told by your health care provider.  Manage your diabetes. ? Eating a healthy diet and exercising regularly are important parts  of managing your blood sugar. If your blood sugar cannot be managed through diet and exercise, you may need to take medicines. ? Take any prescribed medicines to control your diabetes as told by your health care provider.  Control your hypertension. ? To reduce your risk of stroke, try  to keep your blood pressure below 130/80. ? Eating a healthy diet and exercising regularly are an important part of controlling your blood pressure. If your blood pressure cannot be managed through diet and exercise, you may need to take medicines. ? Take any prescribed medicines to control hypertension as told by your health care provider. ? Ask your health care provider if you should monitor your blood pressure at home. ? Have your blood pressure checked every year, even if your blood pressure is normal. Blood pressure increases with age and some medical conditions.  Get evaluated for sleep disorders (sleep apnea). Talk to your health care provider about getting a sleep evaluation if you snore a lot or have excessive sleepiness.  Take over-the-counter and prescription medicines only as told by your health care provider. Aspirin or blood thinners (antiplatelets or anticoagulants) may be recommended to reduce your risk of forming blood clots that can lead to stroke.  Make sure that any other medical conditions you have, such as atrial fibrillation or atherosclerosis, are managed. What are the warning signs of a stroke? The warning signs of a stroke can be easily remembered as BEFAST.  B is for balance. Signs include: ? Dizziness. ? Loss of balance or coordination. ? Sudden trouble walking.  E is for eyes. Signs include: ? A sudden change in vision. ? Trouble seeing.  F is for face. Signs include: ? Sudden weakness or numbness of the face. ? The face or eyelid drooping to one side.  A is for arms. Signs include: ? Sudden weakness or numbness of the arm, usually on one side of the body.  S is for speech. Signs include: ? Trouble speaking (aphasia). ? Trouble understanding.  T is for time. ? These symptoms may represent a serious problem that is an emergency. Do not wait to see if the symptoms will go away. Get medical help right away. Call your local emergency services (911 in the  [place] Do not drive yourself to the hospital.  Other signs of stroke may include: ? A sudden, severe headache with no known cause. ? Nausea or vomiting. ? Seizure. Where to find more information For more information, visit:  American Stroke Association: www.strokeassociation.org  National Stroke Association: www.stroke.org Summary  You can prevent a stroke by eating healthy, exercising, not smoking, limiting alcohol intake, and managing any medical conditions you may have.  Do not use any products that contain nicotine or tobacco, such as cigarettes and e-cigarettes. If you need help quitting, ask your health care provider. It may also be helpful to avoid exposure to secondhand smoke.  Remember BEFAST for warning signs of stroke. Get help right away if you or a loved one has any of these signs. This information is not intended to replace advice given to you by your health care provider. Make sure you discuss any questions you have with your health care provider. Document Released: 08/13/2004 Document Revised: 06/18/2017 Document Reviewed: 08/11/2016 Elsevier Patient Education  2020 [employer].

## 2019-05-08 NOTE — Progress Notes (Signed)
[facility]  PATIENT: [name] DOB: 1956-05-02   REASON FOR VISIT: Follow-up for obstructive sleep apnea with CPAP, history of stroke HISTORY FROM: Patient    HISTORY OF PRESENT ILLNESS:UPDATE 10/8/2019CM [name] is 63 year old male returns for follow-up with a history of obstructive sleep apnea here for CPAP compliance.  He also has a history of stroke in 2017 and currently without stroke or TIA symptoms since that time.  He is doing well with his CPAP.  Data dated 03/26/2018-04/24/2018 shows compliance greater than 4 hours at 97%.  Average usage 8 hours 46 minutes.  Set pressure 12 cm.  EPR level 3.  Leak 95th percentile 4.5.  AHI 3.  He returns for reevaluation  UPDATE 4/22/2019CM [name], 63 year old male returns for follow-up with a history of obstructive sleep apnea here for CPAP compliance.  He also has a history of stroke in July 2017 without recurrent stroke or TIA symptoms.  CPAP compliance data dated 10/09/2017- 11/07/2017 shows compliance greater than 4 hours at 93%.  Average usage 7 hours 43 minutes.  Set pressure of 12 cm AHI 3.1 EPR level 3.  He returns for reevaluation UPDATE 05/10/2017 CM  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 He is currently on Plavix and Eliquis for secondary stroke prevention.He has not had further stroke or TIA symptoms. He has minimal bruising and no bleeding. He remains on TriCor and Niacin for hyperlipidemia and elevated triglycerides. He denies myalgias. Recent random glucose 202. He continues to exercise but recently cut back due to a sore on his foot. His diabetes is in better control.  He denies any dizziness vertigo diplopia balance issues.   Or falls.  CPAP  Compliance  data 04/10/2017 through 05/09/2017  She has 97% greater than 4 hours.  Average usage 7 hours 54 minutes. 12 cm of pressure. EPR 3. AHI 6.2 He returns for reevaluation  UPDATE 04/19/2018CM  [name], 63 year old male  returns for follow-up with hospital admission in July 2017 for pontine stroke. Patient has multiple vascular risk factors including peripheral vascular disease severe hyperlipidemia diabetes hypertension and mild obesity.He is currently on Plavix and eliquis for secondary stroke prevention and atrial fibrillation. He denies any further stroke or TIA symptoms, minimal bruising and no bleeding. He remains on Lipitor for hyperlipidemia, denies myalgias. He claims his diabetes is in better control but is still not where it needs to be. He still has not obtained his split night sleep study. He continues to have some slurring of speech particularly when fatigued. He has not exercising at present and  claims he has decreased stamina due to his peripheral vascular disease. He returns for reevaluation    HISTORY: 04/01/16 PSMr [name] is a 63 year old Caucasian male seen today for first office follow-up visit following hospital admission for pontine stroke in July 2017. He is accompanied today by his wife.[name] is a 63 y.o. malewith history of stroke in March 2017, CAD status post CABG, diabetes mellitus type 2, hypertriglyceridemia, peripheral vascular disease status post femoro/tibial bypass in February 2017 presents to the ER because of persistent difficulty with expressive aphasia and dizziness. Patient's symptoms started on 01/27/2016 when he woke up in the morning. CT of the head did not show anything acute. MRI of the brain shows left pontine infarct.  . Patient on exam is able to move all extremities without difficulty but still has expressive aphasia. No obvious facial asymmetry. Patient  is beyond the window period for TPA. [name] admitted for further evaluation and treatment. On the night after admission he developed worsening neurological status with dysarthria and dysphagia and right facial droop as well as right-sided weakness. Repeat CT scan showed no acute abnormality. 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 Patient had known evidence of posterior circulation disease from previous admission in March 2017. 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 He was seen by Dr. Estanislado Pandy and plan was to do elective basilar artery stenting. He returned a week later and underwent stenting by Dr. Estanislado Pandy on 02/04/16 which was uneventful. Patient postprocedure developed new-onset atrial fibrillation and was started on eliquis and Plavix was continued for the stent. Patient has multiple vascular risk factors including peripheral vascular disease severe hyperlipidemia diabetes hypertension and mild obesity. He states his done well since discharge. He still has some slurred speech. His finish home physical occupational therapy but is starting outpatient therapy soon. Still has some occasional slurred speech and is tired. He can walk independently. His stamina and walking is limited mainly from his peripheral vascular disease. Patient is having only minor bruising but no bleeding. Patient does admit to snoring as well as wife has noticed witnessed apneas. He is not been evaluated for sleep apnea with a polysomnogram yet but is willing to get it done. He started seeing a physician and to help manage his triglycerides and lipids. He has an upcoming appointment to see Dr. Estanislado Pandy to follow this basilar stent. Update 05/08/2019 : He returns for follow-up after last visit 2 years ago with [name] nurse practitioner.  He states he continues to do well from neurovascular standpoint without recurrent stroke or TIA symptoms now for 3 years.  He remains on Plavix which is tolerating well with only minor bruising.  He is also on Eliquis for his A. fib and denies any significant bleeding or bruising.  He states his blood pressure is better controlled at home though today it is elevated in office at 151/62.  He states his sugars have  been variable ranging from 100-200 in the fasting level.  He does see endocrinologist Dr. Chalmers Cater.  He says he has been compliant with using his CPAP daily.  He has not been exercising and knows he needs to lose weight.  He recently started joining water aerobics at the [facility] and plans to be regular.  Review of lab work showed that his total cholesterol was 261 on 01/09/2019 and triglycerides 406 and HDL 35 mg percent.  He has had no new neurological symptoms except does have mild peripheral neuropathy and paresthesias in his feet for which he takes gabapentin which helps   REVIEW OF SYSTEMS: Full 14 system review of systems performed and notable only for those listed, all others are neg: Knee pain, numbness and tingling in the legs, imbalance, snoring, sleep disturbance  ALLERGIES: No Known Allergies  HOME MEDICATIONS: Outpatient Medications Prior to Visit  Medication Sig Dispense Refill   apixaban (ELIQUIS) 5 MG TABS tablet Take 1 tablet (5 mg total) by mouth 2 (two) times daily. 60 tablet 10   atorvastatin (LIPITOR) 40 MG tablet Take 40 mg by mouth daily.     clopidogrel (PLAVIX) 75 MG tablet TAKE 1 TABLET BY MOUTH EVERY DAY 90 tablet 3   empagliflozin (JARDIANCE) 10 MG TABS tablet Take 10 mg by mouth daily.     fenofibrate (TRICOR) 145 MG tablet TAKE  1 TABLET (145 MG TOTAL) BY MOUTH DAILY. PLEASE SCHEDULE APPOINTMENT FOR REFILLS. 90 tablet 2   furosemide (LASIX) 40 MG tablet TAKE 1 TABLET BY MOUTH TWICE A DAY 180 tablet 0   gabapentin (NEURONTIN) 300 MG capsule Take 300 mg by mouth 3 (three) times daily.      Icosapent Ethyl 1 g CAPS Take 2 capsules (2 g total) by mouth 2 (two) times a day. 120 capsule 11   insulin NPH-regular Human (70-30) 100 UNIT/ML injection Inject 25-75 Units into the skin See admin instructions. 75 units with breakfast and dinner, and 25 units at lunch     KLOR-CON M20 20 MEQ tablet TAKE 1 TABLET BY MOUTH TWICE A DAY 180 tablet 0   metFORMIN (GLUCOPHAGE-XR) 500  MG 24 hr tablet Take 1 tablet (500 mg total) by mouth 2 (two) times daily.     metolazone (ZAROXOLYN) 2.5 MG tablet TAKE 1 TABLET ONCE A WEEK ON MONDAYS, MAY TAKE AN EXTRA TABLET IF WT REACHES 257LBS 15 tablet 3   metoprolol succinate (TOPROL-XL) 25 MG 24 hr tablet TAKE 1 TABLET (25 MG TOTAL) BY MOUTH DAILY. *DUE 9/17* 90 tablet 2   naproxen sodium (ALEVE) 220 MG tablet Take 220 mg by mouth daily.     niacin 500 MG tablet Take 500 mg by mouth daily after supper.     lisinopril (PRINIVIL,ZESTRIL) 20 MG tablet Take 1 tablet (20 mg total) by mouth daily. 30 tablet 6   No facility-administered medications prior to visit.     PAST MEDICAL HISTORY: Past Medical History:  Diagnosis Date   Arthritis    "hx; cleaned it out of both shoulders"   CAD (coronary artery disease)    OV, Dr Harlow Asa, MYOVIEW 5/12 on chart  EKG 10/12 EPIC,  chest x ray 01/07/11 EPIC   Carpal tunnel syndrome    peripheral neuropathy   Chronic shoulder pain    "both"   DVT (deep venous thrombosis) (HCC)    hx LLE   History of kidney stones    Hyperlipidemia    Hypertension    Myocardial infarction ([place]) 02/2001   Neuropathy, peripheral    both feet   Peripheral vascular disease, unspecified ([place]) 03/2015   PCI to the right popliteal   Pseudobulbar affect    Skin cancer    "have had them cut or burned off my face" (03/28/2015)   Stroke ([place]) 10/10/2015   Type II diabetes mellitus ([place])     PAST SURGICAL HISTORY: Past Surgical History:  Procedure Laterality Date   APPENDECTOMY  1977   CARDIAC CATHETERIZATION  2002       CARPAL TUNNEL RELEASE Right 2000's   CARPAL TUNNEL RELEASE  12/18/2011   Procedure: CARPAL TUNNEL RELEASE;  Surgeon: Mcarthur Rossetti, MD;  Location: [facility];  Service: Orthopedics;  Laterality: Left;  Left Open Carpal Tunnel Release   CORONARY ANGIOPLASTY     CORONARY ARTERY BYPASS GRAFT  2002   CABG X 4   CYSTOSCOPY  several done in past   FEMORAL-TIBIAL  BYPASS GRAFT Left 01/07/11   fem-posterior tibial BPG using reversed left GSV               12/15/11 OK BY DR Ninfa Linden TO CONTINUE ASA AND PLAVIX   FEMORAL-TIBIAL BYPASS GRAFT Left 09/06/2015   Procedure: LEFT FEMORAL-POSTERIOR TIBIAL ARTERY BYPASS GRAFT WITH COMPOSITE PTFE AND RIGHT ARM VEIN;  Surgeon: Elam Dutch, MD;  Location: [place];  Service: Vascular;  Laterality: Left;  FEMOROPOPLITEAL THROMBECTOMY / EMBOLECTOMY  ~ 2010   FRACTURE SURGERY     IR ANGIO INTRA EXTRACRAN SEL COM CAROTID INNOMINATE BILAT MOD SED  03/02/2017   IR ANGIO VERTEBRAL SEL SUBCLAVIAN INNOMINATE UNI R MOD SED  08/16/2018   IR ANGIO VERTEBRAL SEL VERTEBRAL UNI L MOD SED  03/02/2017   IR ANGIO VERTEBRAL SEL VERTEBRAL UNI L MOD SED  08/16/2018   IR ANGIOGRAM EXTREMITY RIGHT  03/02/2017   IR GENERIC HISTORICAL  02/18/2016   IR RADIOLOGIST EVAL & MGMT 02/18/2016 [facility]   IR GENERIC HISTORICAL  07/30/2016   IR ANGIO VERTEBRAL SEL VERTEBRAL UNI L MOD SED 07/30/2016 Luanne Bras, MD [facility]   IR GENERIC HISTORICAL  07/30/2016   IR ANGIO INTRA EXTRACRAN SEL COM CAROTID INNOMINATE BILAT MOD SED 07/30/2016 Luanne Bras, MD [facility]   IR US GUIDE VASC ACCESS RIGHT  08/16/2018   KNEE ARTHROSCOPY Left X 2   LAPAROSCOPIC CHOLECYSTECTOMY  1990's   LITHOTRIPSY  several done in past   ORIF RADIUS & ULNA FRACTURES Left    PERIPHERAL VASCULAR CATHETERIZATION N/A 03/28/2015   Procedure: Lower Extremity Angiography;  Surgeon: Lorretta Harp, MD;  Location: [place] [facility];  Service: Cardiovascular;  Laterality: N/A;   PERIPHERAL VASCULAR CATHETERIZATION Right 03/28/2015   Procedure: Peripheral Vascular Atherectomy;  Surgeon: Lorretta Harp, MD;  Location: [place] [facility];  Service: Cardiovascular;  Laterality: Right;  popliteal;    PERIPHERAL VASCULAR CATHETERIZATION N/A 08/23/2015   Procedure: Abdominal Aortogram;  Surgeon: Elam Dutch, MD;  Location: [place] [facility];  Service:  Cardiovascular;  Laterality: N/A;   POPLITEAL ARTERY STENT Left 2010-2012 X 4   RADIOLOGY WITH ANESTHESIA N/A 02/04/2016   Procedure: Basilar artery angioplasty with stenting;  Surgeon: Luanne Bras, MD;  Location: [place];  Service: Radiology;  Laterality: N/A;   SHOULDER ARTHROSCOPY Left    SHOULDER ARTHROSCOPY Right 12/18/2011   SHOULDER ARTHROSCOPY  07/08/2012   Procedure: ARTHROSCOPY SHOULDER;  Surgeon: Mcarthur Rossetti, MD;  Location: [facility];  Service: Orthopedics;  Laterality: Left;  Left Shoulder Arthroscopy with Manipulation and Extensive Debridement   SHOULDER ARTHROSCOPY WITH ROTATOR CUFF REPAIR Left 07/14/2013   Procedure: LEFT SHOULDER ARTHROSCOPY WITH EXTENSIVE DEBRIDEMENT, DISTAL CLAVICLE REPAIR;  Surgeon: Mcarthur Rossetti, MD;  Location: [facility];  Service: Orthopedics;  Laterality: Left;   SKIN CANCER EXCISION     "left side of my forehead"   VEIN HARVEST Right 09/06/2015   Procedure: RIGHT ARM VEIN HARVEST;  Surgeon: Elam Dutch, MD;  [facility];  Service: Vascular;  Laterality: Right;    FAMILY HISTORY: Family History  Problem Relation Age of Onset   Cancer Mother        Breast and Brain tumor   Cancer Father        Blood vessel tumor    SOCIAL HISTORY: Social History   Socioeconomic History   Marital status: Widowed    Spouse name: Not on file   Number of children: Not on file   Years of education: Not on file   Highest education level: Not on file  Occupational History   Not on file  Social Needs   Financial resource strain: Not on file   Food insecurity    Worry: Not on file    Inability: Not on file   Transportation needs    Medical: Not on file    Non-medical: Not on file  Tobacco Use   Smoking status: Never  Smoker   Smokeless tobacco: Never Used  Substance and Sexual Activity   Alcohol use: No   Drug use: No   Sexual activity: Yes  Lifestyle   Physical activity    Days per week: Not on file     Minutes per session: Not on file   Stress: Not on file  Relationships   Social connections    Talks on phone: Not on file    Gets together: Not on file    Attends religious service: Not on file    Active member of club or organization: Not on file    Attends meetings of clubs or organizations: Not on file    Relationship status: Not on file   Intimate partner violence    Fear of current or ex partner: Not on file    Emotionally abused: Not on file    Physically abused: Not on file    Forced sexual activity: Not on file  Other Topics Concern   Not on file  Social History Narrative   Not on file     PHYSICAL EXAM  Vitals:   05/08/19 0959  BP: (!) 151/62  Temp: 97.7 F (36.5 C)  Weight: 123.4 kg  Height: 5\' 10"  (1.778 m)   Body mass index is 39.03 kg/m.  Generalized: middle aged Caucasian, Obese male in no acute distress  Head: normocephalic and atraumatic,.   Neck: Supple, no carotid bruits   Musculoskeletal: No deformity Skin no edema or rash  Neurological examination   Mentation: Alert oriented to time, place, history taking. Attention span and concentration appropriate. Recent and remote memory intact.  Follows all commands speech is and language fluent.   Cranial nerve II-XII: Pupils were equal round reactive to light extraocular movements were full, visual field were full on confrontational test. Facial sensation and strength were normal. hearing was intact to finger rubbing bilaterally. Uvula tongue midline. head turning and shoulder shrug were normal and symmetric.Tongue protrusion into cheek strength was normal. Motor: normal bulk and tone, full strength in the BUE, BLE, fine finger movements normal, no pronator drift. No focal weakness Sensory: normal and symmetric to light touch,  on the face arms and legs Coordination: finger-nose-finger, heel-to-shin bilaterally, no dysmetria Gait and Station: Rising up from seated position without assistance,  wide  based stance,  moderate stride, good arm swing, smooth turning, able to perform tiptoe, and heel walking  But favors knee due to pain.  Tandem gait is unsteady.  No assistive device DIAGNOSTIC DATA (LABS, IMAGING, TESTING) - I reviewed patient records, labs, notes, testing and imaging myself where available.  Lab Results  Component Value Date   WBC 7.6 08/09/2018   HGB 13.2 08/09/2018   HCT 42.7 08/09/2018   MCV 91.0 08/09/2018   PLT 263 08/09/2018      Component Value Date/Time   NA 143 09/26/2018 1119   K 4.9 09/26/2018 1119   CL 101 09/26/2018 1119   CO2 24 09/26/2018 1119   GLUCOSE 175 (H) 09/26/2018 1119   GLUCOSE 171 (H) 08/16/2018 0920   BUN 32 (H) 09/26/2018 1119   CREATININE 1.77 (H) 09/26/2018 1119   CREATININE 0.93 03/22/2015 0001   CALCIUM 10.6 (H) 09/26/2018 1119   PROT 6.8 01/09/2019 0818   ALBUMIN 4.1 01/09/2019 0818   AST 13 01/09/2019 0818   ALT 15 01/09/2019 0818   ALKPHOS 49 01/09/2019 0818   BILITOT 0.3 01/09/2019 0818   GFRNONAA 40 (L) 09/26/2018 1119   GFRAA 46 (L) 09/26/2018  1119   Lab Results  Component Value Date   CHOL 231 (H) 01/09/2019   HDL 35 (L) 01/09/2019   LDLCALC Comment 01/09/2019   LDLDIRECT 87 01/16/2015   TRIG 406 (H) 01/09/2019   CHOLHDL 6.6 (H) 01/09/2019   Lab Results  Component Value Date   HGBA1C 11.5 (H) 01/29/2016    Lab Results  Component Value Date   TSH 2.497 01/29/2016      ASSESSMENT AND PLAN 56 year Caucasian male with recurrent pontine infarct secondary to symptomatic basilar stenosis in July 2017 status post elective angioplasty stenting,  atrial fibrillation. Multiple vascular risk factors of obesity, diabetes, hypertension, hyperlipidemia, peripheral vascular disease , intracranial stenosis. and obstructive sleep apnea on CPAP.     PLAN I had a long d/w patient about his remote  stroke, basilar stenosis and stenting, atrial fibrillation risk for recurrent stroke/TIAs, personally independently reviewed  imaging studies and stroke evaluation results and answered questions.Continue Plavix 75 mg daily for basilar stenting and Eliquis for atrial fibrillation for secondary stroke prevention and maintain strict control of hypertension with blood pressure goal below 130/90, diabetes with hemoglobin A1c goal below 6.5% and lipids with LDL cholesterol goal below 70 mg/dL. I also advised the patient to eat a healthy diet with plenty of whole grains, cereals, fruits and vegetables, exercise regularly and maintain ideal body weight .I also advised him to be compliant with using his CPAP every night for his sleep apnea.  Followup in the future with my nurse practitioner Janett Billow in a year or call earlier if necessary. Antony Contras, MD [facility] [address] [place], [place] [address] [phone]

## 2019-05-12 ENCOUNTER — Other Ambulatory Visit: Payer: Self-pay | Admitting: Physician Assistant

## 2019-05-21 ENCOUNTER — Other Ambulatory Visit: Payer: Self-pay | Admitting: Internal Medicine

## 2019-05-27 ENCOUNTER — Other Ambulatory Visit: Payer: Self-pay | Admitting: Cardiovascular Disease

## 2019-05-31 DIAGNOSIS — L821 Other seborrheic keratosis: Secondary | ICD-10-CM | POA: Diagnosis not present

## 2019-05-31 DIAGNOSIS — L570 Actinic keratosis: Secondary | ICD-10-CM | POA: Diagnosis not present

## 2019-05-31 DIAGNOSIS — C44619 Basal cell carcinoma of skin of left upper limb, including shoulder: Secondary | ICD-10-CM | POA: Diagnosis not present

## 2019-05-31 DIAGNOSIS — C44519 Basal cell carcinoma of skin of other part of trunk: Secondary | ICD-10-CM | POA: Diagnosis not present

## 2019-05-31 DIAGNOSIS — L820 Inflamed seborrheic keratosis: Secondary | ICD-10-CM | POA: Diagnosis not present

## 2019-05-31 DIAGNOSIS — D485 Neoplasm of uncertain behavior of skin: Secondary | ICD-10-CM | POA: Diagnosis not present

## 2019-05-31 DIAGNOSIS — B354 Tinea corporis: Secondary | ICD-10-CM | POA: Diagnosis not present

## 2019-06-07 DIAGNOSIS — R69 Illness, unspecified: Secondary | ICD-10-CM | POA: Diagnosis not present

## 2019-06-13 ENCOUNTER — Other Ambulatory Visit: Payer: Self-pay | Admitting: Physician Assistant

## 2019-06-13 NOTE — Telephone Encounter (Signed)
Rx request sent to pharmacy.  

## 2019-06-30 DIAGNOSIS — G4733 Obstructive sleep apnea (adult) (pediatric): Secondary | ICD-10-CM | POA: Diagnosis not present

## 2019-07-06 ENCOUNTER — Other Ambulatory Visit: Payer: Self-pay | Admitting: Cardiovascular Disease

## 2019-07-06 DIAGNOSIS — R69 Illness, unspecified: Secondary | ICD-10-CM | POA: Diagnosis not present

## 2019-07-06 NOTE — Telephone Encounter (Signed)
Refill Request.  

## 2019-07-12 NOTE — Telephone Encounter (Signed)
completed

## 2019-08-01 ENCOUNTER — Telehealth (HOSPITAL_COMMUNITY): Payer: Self-pay

## 2019-08-01 ENCOUNTER — Other Ambulatory Visit (HOSPITAL_COMMUNITY): Payer: Self-pay | Admitting: Interventional Radiology

## 2019-08-01 DIAGNOSIS — I771 Stricture of artery: Secondary | ICD-10-CM

## 2019-08-01 NOTE — Telephone Encounter (Signed)
Called to schedule f/u mri/mra, no answer, left vm. AW  

## 2019-08-13 DIAGNOSIS — R69 Illness, unspecified: Secondary | ICD-10-CM | POA: Diagnosis not present

## 2019-08-14 ENCOUNTER — Other Ambulatory Visit: Payer: Self-pay | Admitting: Cardiovascular Disease

## 2019-08-21 ENCOUNTER — Other Ambulatory Visit: Payer: Self-pay

## 2019-08-21 ENCOUNTER — Ambulatory Visit (HOSPITAL_COMMUNITY)
Admission: RE | Admit: 2019-08-21 | Discharge: 2019-08-21 | Disposition: A | Discharge status: Home / Self Care | Payer: Medicare HMO | Source: Ambulatory Visit | Attending: Interventional Radiology | Admitting: Interventional Radiology

## 2019-08-21 ENCOUNTER — Ambulatory Visit (HOSPITAL_COMMUNITY): Admission: RE | Admit: 2019-08-21 | Payer: Medicare HMO | Source: Ambulatory Visit

## 2019-08-21 DIAGNOSIS — I672 Cerebral atherosclerosis: Secondary | ICD-10-CM | POA: Insufficient documentation

## 2019-08-21 DIAGNOSIS — I6501 Occlusion and stenosis of right vertebral artery: Secondary | ICD-10-CM | POA: Insufficient documentation

## 2019-08-21 DIAGNOSIS — I771 Stricture of artery: Secondary | ICD-10-CM

## 2019-08-21 DIAGNOSIS — I6521 Occlusion and stenosis of right carotid artery: Secondary | ICD-10-CM | POA: Insufficient documentation

## 2019-08-21 DIAGNOSIS — I6601 Occlusion and stenosis of right middle cerebral artery: Secondary | ICD-10-CM | POA: Diagnosis not present

## 2019-08-21 DIAGNOSIS — I651 Occlusion and stenosis of basilar artery: Secondary | ICD-10-CM | POA: Diagnosis not present

## 2019-08-21 LAB — CREATININE, SERUM
Creatinine, Ser: 1.51 mg/dL — ABNORMAL HIGH (ref 0.61–1.24)
GFR calc Af Amer: 55 mL/min — ABNORMAL LOW (ref >60)
GFR calc non Af Amer: 48 mL/min — ABNORMAL LOW (ref >60)

## 2019-08-21 MED ORDER — GADOBUTROL 1 MMOL/ML IV SOLN
10.0000 mL | Freq: Once | INTRAVENOUS | Status: AC | PRN
  Administered 2019-08-21: 10 mL via INTRAVENOUS

## 2019-08-22 ENCOUNTER — Telehealth (HOSPITAL_COMMUNITY): Payer: Self-pay

## 2019-08-22 NOTE — Telephone Encounter (Signed)
Pt agreed to f/u in 3 months with diagnostic angiogram. He is not currently having any sx. AW

## 2019-09-04 DIAGNOSIS — E1165 Type 2 diabetes mellitus with hyperglycemia: Secondary | ICD-10-CM | POA: Diagnosis not present

## 2019-09-04 DIAGNOSIS — E1140 Type 2 diabetes mellitus with diabetic neuropathy, unspecified: Secondary | ICD-10-CM | POA: Diagnosis not present

## 2019-09-04 DIAGNOSIS — E1121 Type 2 diabetes mellitus with diabetic nephropathy: Secondary | ICD-10-CM | POA: Diagnosis not present

## 2019-09-04 DIAGNOSIS — E7800 Pure hypercholesterolemia, unspecified: Secondary | ICD-10-CM | POA: Diagnosis not present

## 2019-09-04 DIAGNOSIS — I10 Essential (primary) hypertension: Secondary | ICD-10-CM | POA: Diagnosis not present

## 2019-09-08 DIAGNOSIS — I509 Heart failure, unspecified: Secondary | ICD-10-CM | POA: Diagnosis not present

## 2019-09-08 DIAGNOSIS — E1165 Type 2 diabetes mellitus with hyperglycemia: Secondary | ICD-10-CM | POA: Diagnosis not present

## 2019-09-08 DIAGNOSIS — Z794 Long term (current) use of insulin: Secondary | ICD-10-CM | POA: Diagnosis not present

## 2019-09-08 DIAGNOSIS — I110 Hypertensive heart disease with heart failure: Secondary | ICD-10-CM | POA: Diagnosis not present

## 2019-09-08 DIAGNOSIS — I4891 Unspecified atrial fibrillation: Secondary | ICD-10-CM | POA: Diagnosis not present

## 2019-09-08 DIAGNOSIS — E785 Hyperlipidemia, unspecified: Secondary | ICD-10-CM | POA: Diagnosis not present

## 2019-09-08 DIAGNOSIS — E1151 Type 2 diabetes mellitus with diabetic peripheral angiopathy without gangrene: Secondary | ICD-10-CM | POA: Diagnosis not present

## 2019-09-08 DIAGNOSIS — D6869 Other thrombophilia: Secondary | ICD-10-CM | POA: Diagnosis not present

## 2019-09-08 DIAGNOSIS — E1142 Type 2 diabetes mellitus with diabetic polyneuropathy: Secondary | ICD-10-CM | POA: Diagnosis not present

## 2019-09-20 DIAGNOSIS — H25813 Combined forms of age-related cataract, bilateral: Secondary | ICD-10-CM | POA: Diagnosis not present

## 2019-09-20 DIAGNOSIS — E113293 Type 2 diabetes mellitus with mild nonproliferative diabetic retinopathy without macular edema, bilateral: Secondary | ICD-10-CM | POA: Diagnosis not present

## 2019-09-29 ENCOUNTER — Encounter: Payer: Self-pay | Admitting: Internal Medicine

## 2019-09-29 ENCOUNTER — Ambulatory Visit: Payer: Medicare HMO | Admitting: Internal Medicine

## 2019-09-29 ENCOUNTER — Other Ambulatory Visit: Payer: Self-pay

## 2019-09-29 VITALS — BP 130/76 | HR 60 | Temp 98.1°F | Ht 70.0 in | Wt 275.0 lb

## 2019-09-29 DIAGNOSIS — E785 Hyperlipidemia, unspecified: Secondary | ICD-10-CM

## 2019-09-29 DIAGNOSIS — Z8673 Personal history of transient ischemic attack (TIA), and cerebral infarction without residual deficits: Secondary | ICD-10-CM | POA: Diagnosis not present

## 2019-09-29 DIAGNOSIS — I739 Peripheral vascular disease, unspecified: Secondary | ICD-10-CM | POA: Diagnosis not present

## 2019-09-29 DIAGNOSIS — I2510 Atherosclerotic heart disease of native coronary artery without angina pectoris: Secondary | ICD-10-CM | POA: Diagnosis not present

## 2019-09-29 NOTE — Patient Instructions (Signed)
Medication Instructions:  Your physician recommends that you continue on your current medications as directed. Please refer to the Current Medication list given to you today.  *If you need a refill on your cardiac medications before your next appointment, please call your pharmacy*   Lab Work: FASTING lab work to check cholesterol next week   If you have labs (blood work) drawn today and your tests are completely normal, you will receive your results only by: [name] MyChart Message (if you have MyChart) OR . A paper copy in the mail If you have any lab test that is abnormal or we need to change your treatment, we will call you to review the results.   Testing/Procedures: NONE   Follow-Up: At [facility], you and your health needs are our priority.  As part of our continuing mission to provide you with exceptional heart care, we have created designated Provider Care Teams.  These Care Teams include your primary Cardiologist (physician) and Advanced Practice Providers (APPs -  Physician Assistants and Nurse Practitioners) who all work together to provide you with the care you need, when you need it.  We recommend signing up for the patient portal called "MyChart".  Sign up information is provided on this After Visit Summary.  MyChart is used to connect with patients for Virtual Visits (Telemedicine).  Patients are able to view lab/test results, encounter notes, upcoming appointments, etc.  Non-urgent messages can be sent to your provider as well.   To learn more about what you can do with MyChart, go to [email or web address].    Your next appointment:   6 month(s) - lipid clinic  The format for your next appointment:   Either In Person or Virtual  Provider:   K. Mali Hilty, MD   Other Instructions

## 2019-09-30 ENCOUNTER — Encounter: Payer: Self-pay | Admitting: Internal Medicine

## 2019-09-30 NOTE — Progress Notes (Signed)
LIPID CLINIC CONSULT NOTE  Chief Complaint:  Manage dyslipidemia  Primary Care Physician: Patient, No Pcp Per  Primary Cardiologist:  Quay Burow, MD  HPI:  [name] is a 64 y.o. male who is being seen today for the evaluation of Dyslipidemia at the request of Dr. Gwenlyn Found. This is a 86-year-old male patient of Dr. Alvester Chou with a history of coronary artery disease, hypertension, dyslipidemia, type 2 diabetes and prior stroke who presents for evaluation management of dyslipidemia.  Most recently his lipids were reassessed and remain uncontrolled.  His target LDL is less than 70.  Total cholesterol was 231, HDL 35 and direct LDL 87, triglycerides elevated 406.  He is currently on fenofibrate 145 mg daily as well as Vascepa 2 g twice daily.  He reports tolerating this without difficulty.  He also takes atorvastatin 40 mg daily.  PMHx:  Past Medical History:  Diagnosis Date  . Arthritis    "hx; cleaned it out of both shoulders"  . CAD (coronary artery disease)    OV, Dr Harlow Asa, [facility] 5/12 on chart  EKG 10/12 EPIC,  chest x ray 01/07/11 EPIC  . Carpal tunnel syndrome    peripheral neuropathy  . Chronic shoulder pain    "both"  . DVT (deep venous thrombosis) (HCC)    hx LLE  . History of kidney stones   . Hyperlipidemia   . Hypertension   . Myocardial infarction ([place]) 02/2001  . Neuropathy, peripheral    both feet  . Peripheral vascular disease, unspecified ([place]) 03/2015   PCI to the right popliteal  . Pseudobulbar affect   . Skin cancer    "have had them cut or burned off my face" (03/28/2015)  . Stroke ([place]) 10/10/2015  . Type II diabetes mellitus ([place])     Past Surgical History:  Procedure Laterality Date  . APPENDECTOMY  1977  . CARDIAC CATHETERIZATION  2002      . CARPAL TUNNEL RELEASE Right 2000's  . CARPAL TUNNEL RELEASE  12/18/2011   Procedure: CARPAL TUNNEL RELEASE;  Surgeon: Mcarthur Rossetti, MD;  Location: [facility];  Service: Orthopedics;  Laterality:  Left;  Left Open Carpal Tunnel Release  . CORONARY ANGIOPLASTY    . CORONARY ARTERY BYPASS GRAFT  2002   CABG X 4  . CYSTOSCOPY  several done in past  . FEMORAL-TIBIAL BYPASS GRAFT Left 01/07/11   fem-posterior tibial BPG using reversed left GSV               12/15/11 OK BY DR Ninfa Linden TO CONTINUE ASA AND PLAVIX  . FEMORAL-TIBIAL BYPASS GRAFT Left 09/06/2015   Procedure: LEFT FEMORAL-POSTERIOR TIBIAL ARTERY BYPASS GRAFT WITH COMPOSITE PTFE AND RIGHT ARM VEIN;  Surgeon: Elam Dutch, MD;  Location: [facility];  Service: Vascular;  Laterality: Left;  . FEMOROPOPLITEAL THROMBECTOMY / EMBOLECTOMY  ~ 2010  . FRACTURE SURGERY    . IR ANGIO INTRA EXTRACRAN SEL COM CAROTID INNOMINATE BILAT MOD SED  03/02/2017  . IR ANGIO VERTEBRAL SEL SUBCLAVIAN INNOMINATE UNI R MOD SED  08/16/2018  . IR ANGIO VERTEBRAL SEL VERTEBRAL UNI L MOD SED  03/02/2017  . IR ANGIO VERTEBRAL SEL VERTEBRAL UNI L MOD SED  08/16/2018  . IR ANGIOGRAM EXTREMITY RIGHT  03/02/2017  . IR GENERIC HISTORICAL  02/18/2016   IR RADIOLOGIST EVAL & MGMT 02/18/2016 [facility]  . IR GENERIC HISTORICAL  07/30/2016   IR ANGIO VERTEBRAL SEL VERTEBRAL UNI L MOD SED 07/30/2016 Luanne Bras, MD [facility]  . IR GENERIC HISTORICAL  07/30/2016   IR ANGIO INTRA EXTRACRAN SEL COM CAROTID INNOMINATE BILAT MOD SED 07/30/2016 Luanne Bras, MD [facility]  . IR US GUIDE VASC ACCESS RIGHT  08/16/2018  . KNEE ARTHROSCOPY Left X 2  . LAPAROSCOPIC CHOLECYSTECTOMY  1990's  . LITHOTRIPSY  several done in past  . ORIF RADIUS & ULNA FRACTURES Left   . PERIPHERAL VASCULAR CATHETERIZATION N/A 03/28/2015   Procedure: Lower Extremity Angiography;  Surgeon: Lorretta Harp, MD;  Location: [facility];  Service: Cardiovascular;  Laterality: N/A;  . PERIPHERAL VASCULAR CATHETERIZATION Right 03/28/2015   Procedure: Peripheral Vascular Atherectomy;  Surgeon: Lorretta Harp, MD;  Location: [facility];  Service: Cardiovascular;  Laterality: Right;   popliteal;   . PERIPHERAL VASCULAR CATHETERIZATION N/A 08/23/2015   Procedure: Abdominal Aortogram;  Surgeon: Elam Dutch, MD;  Location: [facility];  Service: Cardiovascular;  Laterality: N/A;  . POPLITEAL ARTERY STENT Left 2010-2012 X 4  . RADIOLOGY WITH ANESTHESIA N/A 02/04/2016   Procedure: Basilar artery angioplasty with stenting;  Surgeon: Luanne Bras, MD;  Location: [facility];  Service: Radiology;  Laterality: N/A;  . SHOULDER ARTHROSCOPY Left   . SHOULDER ARTHROSCOPY Right 12/18/2011  . SHOULDER ARTHROSCOPY  07/08/2012   Procedure: ARTHROSCOPY SHOULDER;  Surgeon: Mcarthur Rossetti, MD;  Location[facility];  Service: Orthopedics;  Laterality: Left;  Left Shoulder Arthroscopy with Manipulation and Extensive Debridement  . SHOULDER ARTHROSCOPY WITH ROTATOR CUFF REPAIR Left 07/14/2013   Procedure: LEFT SHOULDER ARTHROSCOPY WITH EXTENSIVE DEBRIDEMENT, DISTAL CLAVICLE REPAIR;  Surgeon: Mcarthur Rossetti, MD;  [facility];  Service: Orthopedics;  Laterality: Left;  . SKIN CANCER EXCISION     "left side of my forehead"  . VEIN HARVEST Right 09/06/2015   Procedure: RIGHT ARM VEIN HARVEST;  Surgeon: Elam Dutch, MD;  Location: [facility];  Service: Vascular;  Laterality: Right;    FAMHx:  Family History  Problem Relation Age of Onset  . Cancer Mother        Breast and Brain tumor  . Cancer Father        Blood vessel tumor    SOCHx:   reports that he has never smoked. He has never used smokeless tobacco. He reports that he does not drink alcohol or use drugs.  ALLERGIES:  No Known Allergies  ROS: Pertinent items noted in HPI and remainder of comprehensive ROS otherwise negative.  HOME MEDS: Current Outpatient Medications on File Prior to Visit  Medication Sig Dispense Refill  . atorvastatin (LIPITOR) 40 MG tablet Take 40 mg by mouth daily.    . BD INSULIN SYRINGE U/F 31G X 5/16" 1 ML MISC AS DIRECTED 3 TIMES A DAY SUBCUTANEOUS 30    . clopidogrel (PLAVIX)  75 MG tablet TAKE 1 TABLET BY MOUTH EVERY DAY 90 tablet 3  . ELIQUIS 5 MG TABS tablet TAKE 1 TABLET BY MOUTH TWICE A DAY 60 tablet 10  . empagliflozin (JARDIANCE) 10 MG TABS tablet Take 10 mg by mouth daily.    . fenofibrate (TRICOR) 145 MG tablet TAKE 1 TABLET (145 MG TOTAL) BY MOUTH DAILY. PLEASE SCHEDULE APPOINTMENT FOR REFILLS. 90 tablet 3  . furosemide (LASIX) 40 MG tablet Take 1 tablet (40 mg total) by mouth 2 (two) times daily. 60 tablet 6  . gabapentin (NEURONTIN) 300 MG capsule Take 300 mg by mouth 3 (three) times daily.     Vanessa Kick Ethyl 1 g CAPS Take  2 capsules (2 g total) by mouth 2 (two) times a day. 120 capsule 11  . insulin NPH-regular Human (70-30) 100 UNIT/ML injection Inject 25-75 Units into the skin See admin instructions. 75 units with breakfast and dinner, and 25 units at lunch    . KLOR-CON M20 20 MEQ tablet TAKE 1 TABLET BY MOUTH TWICE A DAY 180 tablet 2  . metFORMIN (GLUCOPHAGE-XR) 500 MG 24 hr tablet Take 1 tablet (500 mg total) by mouth 2 (two) times daily.    . metolazone (ZAROXOLYN) 2.5 MG tablet TAKE 1 TABLET ONCE A WEEK ON MONDAYS, MAY TAKE AN EXTRA TABLET IF WT REACHES 257LBS 15 tablet 3  . metoprolol succinate (TOPROL-XL) 25 MG 24 hr tablet TAKE 1 TABLET (25 MG TOTAL) BY MOUTH DAILY. *DUE 9/17* 90 tablet 1  . ONETOUCH VERIO test strip AS DIRECTED 3 TIMES A DAY 30    . lisinopril (PRINIVIL,ZESTRIL) 20 MG tablet Take 1 tablet (20 mg total) by mouth daily. 30 tablet 6   No current facility-administered medications on file prior to visit.    LABS/IMAGING: No results found for this or any previous visit (from the past 48 hour(s)). No results found.  LIPID PANEL:    Component Value Date/Time   CHOL 231 (H) 01/09/2019 0818   TRIG 406 (H) 01/09/2019 0818   HDL 35 (L) 01/09/2019 0818   CHOLHDL 6.6 (H) 01/09/2019 0818   CHOLHDL NOT REPORTED DUE TO HIGH TRIGLYCERIDES 01/29/2016 0538   VLDL UNABLE TO CALCULATE IF TRIGLYCERIDE OVER 400 mg/dL 01/29/2016 0538    LDLCALC Comment 01/09/2019 0818   LDLDIRECT 87 01/16/2015 0813    WEIGHTS: Wt Readings from Last 3 Encounters:  09/29/19 275 lb (124.7 kg)  05/08/19 272 lb (123.4 kg)  02/15/19 267 lb (121.1 kg)    VITALS: BP 130/76   Pulse 60   Temp 98.1 F (36.7 C)   Ht 5\' 10"  (1.778 m)   Wt 275 lb (124.7 kg)   SpO2 97%   BMI 39.46 kg/m   EXAM: Deferred  EKG: Deferred4  ASSESSMENT: 1. Mixed hyperlipidemia, goal LDL less than 70 2. Coronary disease 3. Hypertension 4. Prior stroke 5. Type 2 diabetes  PLAN: 1.   Mr. Hislop has a mixed dyslipidemia with elevated cholesterol and is above target LDL with persistently elevated triglycerides.  He has not had repeat testing since being on both Vascepa and fenofibrate.  His most previous LDL cholesterol was still also above 70.  He is on atorvastatin 40 mg as well.  Plan repeat fasting lipid profile and adjust medication accordingly.  Follow-up with me in 6 months.  Thanks for the kind referral.  Pixie Casino, MD, FACC, [place] Director of the [facility]  Cardiovascular Risk Reduction Clinic Diplomate of the American Board of Clinical Lipidology Attending Cardiologist  Direct Dial: [phone]  Fax: [phone]  Website:  [email or web address] [email or web address] Jamiel Goncalves 09/30/2019, 10:07 PM

## 2019-10-03 DIAGNOSIS — G4733 Obstructive sleep apnea (adult) (pediatric): Secondary | ICD-10-CM | POA: Diagnosis not present

## 2019-10-04 DIAGNOSIS — E785 Hyperlipidemia, unspecified: Secondary | ICD-10-CM | POA: Diagnosis not present

## 2019-10-04 DIAGNOSIS — E781 Pure hyperglyceridemia: Secondary | ICD-10-CM | POA: Diagnosis not present

## 2019-10-04 LAB — LIPID PANEL
Chol/HDL Ratio: 6.1 ratio — ABNORMAL HIGH (ref 0.0–5.0)
Cholesterol, Total: 183 mg/dL (ref 100–199)
HDL: 30 mg/dL — ABNORMAL LOW (ref >39)
LDL Chol Calc (NIH): 107 mg/dL — ABNORMAL HIGH (ref 0–99)
Triglycerides: 264 mg/dL — ABNORMAL HIGH (ref 0–149)
VLDL Cholesterol Cal: 46 mg/dL — ABNORMAL HIGH (ref 5–40)

## 2019-10-04 LAB — LDL CHOLESTEROL, DIRECT: LDL Direct: 108 mg/dL — ABNORMAL HIGH (ref 0–99)

## 2019-10-05 LAB — LIPOPROTEIN A (LPA): Lipoprotein (a): 9.6 nmol/L (ref <75.0)

## 2019-10-24 DIAGNOSIS — R69 Illness, unspecified: Secondary | ICD-10-CM | POA: Diagnosis not present

## 2019-11-13 ENCOUNTER — Other Ambulatory Visit: Payer: Self-pay | Admitting: Physician Assistant

## 2019-12-06 DIAGNOSIS — I87391 Chronic venous hypertension (idiopathic) with other complications of right lower extremity: Secondary | ICD-10-CM | POA: Diagnosis not present

## 2019-12-06 DIAGNOSIS — L0109 Other impetigo: Secondary | ICD-10-CM | POA: Diagnosis not present

## 2019-12-06 DIAGNOSIS — I87392 Chronic venous hypertension (idiopathic) with other complications of left lower extremity: Secondary | ICD-10-CM | POA: Diagnosis not present

## 2019-12-06 DIAGNOSIS — L570 Actinic keratosis: Secondary | ICD-10-CM | POA: Diagnosis not present

## 2019-12-06 DIAGNOSIS — I872 Venous insufficiency (chronic) (peripheral): Secondary | ICD-10-CM | POA: Diagnosis not present

## 2019-12-06 DIAGNOSIS — B354 Tinea corporis: Secondary | ICD-10-CM | POA: Diagnosis not present

## 2019-12-06 DIAGNOSIS — C44519 Basal cell carcinoma of skin of other part of trunk: Secondary | ICD-10-CM | POA: Diagnosis not present

## 2019-12-10 ENCOUNTER — Other Ambulatory Visit: Payer: Self-pay | Admitting: Internal Medicine

## 2019-12-20 ENCOUNTER — Telehealth (HOSPITAL_COMMUNITY): Payer: Self-pay

## 2019-12-20 ENCOUNTER — Other Ambulatory Visit (HOSPITAL_COMMUNITY): Payer: Self-pay | Admitting: Interventional Radiology

## 2019-12-20 DIAGNOSIS — I771 Stricture of artery: Secondary | ICD-10-CM

## 2019-12-20 NOTE — Telephone Encounter (Signed)
Called to schedule angiogram, no answer, left vm. AW 

## 2019-12-21 ENCOUNTER — Other Ambulatory Visit: Payer: Self-pay | Admitting: Cardiovascular Disease

## 2019-12-25 ENCOUNTER — Telehealth: Payer: Self-pay | Admitting: Cardiovascular Disease

## 2019-12-25 NOTE — Telephone Encounter (Signed)
I do not see a clearance form in the patient's chart or any mention of upcoming surgery. If requesting surgeon's office does need cardiac clearance, please have them fax a form to our office.   I will remove this message chain from pre-op pool and we will await clearance form..  Thank you!

## 2019-12-25 NOTE — Telephone Encounter (Signed)
Follow Up:    Caryl Pina is calling to check on the status of pt's clearance please. Please send via Epic.

## 2019-12-25 NOTE — Telephone Encounter (Signed)
Pharmacy, can you please comment on how long patient can hold Eliquis for upcoming procedure?  Thank you! 

## 2019-12-25 NOTE — Telephone Encounter (Signed)
   [facility] Pre-operative Risk Assessment    HEARTCARE STAFF: - Please ensure there is not already an duplicate clearance open for this procedure. - Under Visit Info/Reason for Call, type in Other and utilize the format Clearance MM/DD/YY or Clearance TBD. Do not use dashes or single digits. - If request is for dental extraction, please clarify the # of teeth to be extracted.  Request for surgical clearance:  1. What type of surgery is being performed? DIAGNOSTIC ANGIOGRAM    2. When is this surgery scheduled? January 04, 2020    3. What type of clearance is required (medical clearance vs. Pharmacy clearance to hold med vs. Both)? MEDICATION  4. Are there any medications that need to be held prior to surgery and how long? ELIQUIS- CAN PATIENT HOLD ELIQUIS 18 HOURS PRIOR TO HIS PROCEDURE?   5. Practice name and name of physician performing surgery? [facility]    6. What is the office phone number? [phone]   7.   What is the office fax number? [phone]  8.   Anesthesia type (None, local, MAC, general) ? NOT Tonita Phoenix 12/25/2019, 4:24 PM  _________________________________________________________________   (provider comments below)

## 2019-12-26 NOTE — Telephone Encounter (Signed)
Follow up    [name] from [facility] is calling and says to cancel  Clearance request to hold Eliquis.

## 2019-12-26 NOTE — Telephone Encounter (Signed)
Left message at Dr. Arlean Hopping office to call back to clarify if it is okay for the patient to only hold eliquis for 1 day prior to procedure given he is high risk due to history of multiple strokes.

## 2019-12-26 NOTE — Telephone Encounter (Signed)
Pre-op covering staff, can we please check with Dr. Estanislado Pandy to see if he is OK with only holding Eliquis for 1 day given he is high risk due to history of multiple strokes. If he needs to hold Eliquis for more than 24 hours, we will need to check with Dr. Gwenlyn Found.   Thank you!

## 2019-12-26 NOTE — Telephone Encounter (Signed)
Prefer to hold Eliquis for 2 days prior to an angio

## 2019-12-26 NOTE — Telephone Encounter (Signed)
Patient with diagnosis of a fib on Eliquis for anticoagulation.  Has history of multiple strokes (most recent 01/28/16, 10/10/15), MI (02/2001), and DVT.  Procedure: DIAGNOSTIC ANGIOGRAM   Date of procedure: 01/04/20  CHADS2-VASc score of  6  (HTN, AGE, DM2, stroke/tia x 2, CAD)  CrCl 65 mL/min using adjusted body weight  Patient is very high risk due to history of multiple strokes.  Prefer to hold for only 24 hours.   If longer hold is needed would consult MD.

## 2019-12-27 NOTE — Telephone Encounter (Signed)
   Primary Cardiologist: Quay Burow, MD  Chart reviewed as part of pre-operative protocol coverage. Patient scheduled to have diagnostic angiogram and we were asked to give our recommendations for holding Eliquis. CHA2DS2-VASc = 6. Patient is at a high risk coming off anticoagulation with a history of multiple strokes. However, per Dr. Gwenlyn Found, he would prefer to hold Eliquis for 2 days prior to an angiogram. This should be restarted as soon as it is felt safe from a bleeding standpoint following the procedure.   I will route this recommendation to the requesting party via Epic fax function and remove from pre-op pool.  Please call with questions.  Darreld Mclean, PA-C 12/27/2019, 8:57 AM

## 2019-12-27 NOTE — Telephone Encounter (Signed)
I s/w the pt and went over the recommendations per Dr. Gwenlyn Found to hold Eliquis x 2 days prior surgery. Pt tells me that he thought procedure was cancelled but was not sure.  I will fax notes to surgeon. I will remove from the pre op call back pool.

## 2019-12-28 ENCOUNTER — Telehealth: Payer: Self-pay

## 2019-12-28 NOTE — Telephone Encounter (Signed)
.  ° °   [facility] Pre-operative Risk Assessment    Request for surgical clearance:  1. What type of surgery is being performed? Diagnostic Angiogram    2. When is this surgery scheduled? 01/04/20   3. What type of clearance is required (medical clearance vs. Pharmacy clearance to hold med vs. Both)? Pharmacy  4. Are there any medications that need to be held prior to surgery and how long? Eliquis 48 hours prior to the procedure    5. Practice name and name of physician performing surgery? Dr. Estanislado Pandy    6. What is the office phone number? [phone]   7.   What is the office fax number? [phone]  8.   Anesthesia type (None, local, MAC, general) ?          Not noted

## 2019-12-28 NOTE — Telephone Encounter (Signed)
Please see other clearance encounter.  Per Dr Gwenlyn Found, ok to hold for 2 days.  However, patient believes procedure was canceled.  Waiting follow up from surgeon.

## 2020-01-02 ENCOUNTER — Other Ambulatory Visit (HOSPITAL_COMMUNITY): Payer: Self-pay | Admitting: Interventional Radiology

## 2020-01-02 DIAGNOSIS — I771 Stricture of artery: Secondary | ICD-10-CM

## 2020-01-04 ENCOUNTER — Ambulatory Visit (HOSPITAL_COMMUNITY): Admission: RE | Admit: 2020-01-04 | Payer: Medicare HMO | Source: Ambulatory Visit

## 2020-01-04 ENCOUNTER — Encounter (HOSPITAL_COMMUNITY): Payer: Self-pay

## 2020-01-08 DIAGNOSIS — G4733 Obstructive sleep apnea (adult) (pediatric): Secondary | ICD-10-CM | POA: Diagnosis not present

## 2020-01-16 ENCOUNTER — Other Ambulatory Visit: Payer: Self-pay

## 2020-01-16 ENCOUNTER — Ambulatory Visit (HOSPITAL_COMMUNITY)
Admission: RE | Admit: 2020-01-16 | Discharge: 2020-01-16 | Disposition: A | Discharge status: Home / Self Care | Payer: Medicare HMO | Source: Ambulatory Visit | Attending: Interventional Radiology | Admitting: Interventional Radiology

## 2020-01-16 DIAGNOSIS — I771 Stricture of artery: Secondary | ICD-10-CM | POA: Diagnosis not present

## 2020-01-16 DIAGNOSIS — I6601 Occlusion and stenosis of right middle cerebral artery: Secondary | ICD-10-CM | POA: Diagnosis not present

## 2020-01-16 DIAGNOSIS — I6503 Occlusion and stenosis of bilateral vertebral arteries: Secondary | ICD-10-CM | POA: Diagnosis not present

## 2020-01-16 DIAGNOSIS — I6523 Occlusion and stenosis of bilateral carotid arteries: Secondary | ICD-10-CM | POA: Diagnosis not present

## 2020-01-16 DIAGNOSIS — I672 Cerebral atherosclerosis: Secondary | ICD-10-CM | POA: Diagnosis not present

## 2020-01-16 DIAGNOSIS — I6623 Occlusion and stenosis of bilateral posterior cerebral arteries: Secondary | ICD-10-CM | POA: Diagnosis not present

## 2020-01-16 LAB — POCT I-STAT CREATININE: Creatinine, Ser: 1.5 mg/dL — ABNORMAL HIGH (ref 0.61–1.24)

## 2020-01-16 MED ORDER — IOHEXOL 350 MG/ML SOLN
75.0000 mL | Freq: Once | INTRAVENOUS | Status: AC | PRN
  Administered 2020-01-16: 75 mL via INTRAVENOUS

## 2020-01-17 ENCOUNTER — Telehealth (HOSPITAL_COMMUNITY): Payer: Self-pay

## 2020-01-17 NOTE — Telephone Encounter (Signed)
Called pt regarding recent imaging, no answer, left vm. AW  

## 2020-01-17 NOTE — Telephone Encounter (Signed)
Pt agreed to f/u in 3 months with diagnostic angiogram. AW

## 2020-02-07 DIAGNOSIS — G4733 Obstructive sleep apnea (adult) (pediatric): Secondary | ICD-10-CM | POA: Diagnosis not present

## 2020-02-13 DIAGNOSIS — G4733 Obstructive sleep apnea (adult) (pediatric): Secondary | ICD-10-CM | POA: Diagnosis not present

## 2020-02-15 ENCOUNTER — Other Ambulatory Visit: Payer: Self-pay | Admitting: Cardiovascular Disease

## 2020-02-16 DIAGNOSIS — M4696 Unspecified inflammatory spondylopathy, lumbar region: Secondary | ICD-10-CM | POA: Diagnosis not present

## 2020-02-16 DIAGNOSIS — M545 Low back pain: Secondary | ICD-10-CM | POA: Diagnosis not present

## 2020-02-16 DIAGNOSIS — Z8673 Personal history of transient ischemic attack (TIA), and cerebral infarction without residual deficits: Secondary | ICD-10-CM | POA: Diagnosis not present

## 2020-02-16 DIAGNOSIS — G4733 Obstructive sleep apnea (adult) (pediatric): Secondary | ICD-10-CM | POA: Diagnosis not present

## 2020-02-16 DIAGNOSIS — M62830 Muscle spasm of back: Secondary | ICD-10-CM | POA: Diagnosis not present

## 2020-02-16 DIAGNOSIS — Z7901 Long term (current) use of anticoagulants: Secondary | ICD-10-CM | POA: Diagnosis not present

## 2020-02-16 DIAGNOSIS — E1342 Other specified diabetes mellitus with diabetic polyneuropathy: Secondary | ICD-10-CM | POA: Diagnosis not present

## 2020-02-19 ENCOUNTER — Other Ambulatory Visit: Payer: Self-pay | Admitting: Internal Medicine

## 2020-03-03 ENCOUNTER — Other Ambulatory Visit: Payer: Self-pay | Admitting: Internal Medicine

## 2020-03-04 DIAGNOSIS — I10 Essential (primary) hypertension: Secondary | ICD-10-CM | POA: Diagnosis not present

## 2020-03-04 DIAGNOSIS — E7800 Pure hypercholesterolemia, unspecified: Secondary | ICD-10-CM | POA: Diagnosis not present

## 2020-03-04 DIAGNOSIS — E1140 Type 2 diabetes mellitus with diabetic neuropathy, unspecified: Secondary | ICD-10-CM | POA: Diagnosis not present

## 2020-03-04 DIAGNOSIS — E1165 Type 2 diabetes mellitus with hyperglycemia: Secondary | ICD-10-CM | POA: Diagnosis not present

## 2020-03-04 DIAGNOSIS — E1121 Type 2 diabetes mellitus with diabetic nephropathy: Secondary | ICD-10-CM | POA: Diagnosis not present

## 2020-03-09 DIAGNOSIS — G4733 Obstructive sleep apnea (adult) (pediatric): Secondary | ICD-10-CM | POA: Diagnosis not present

## 2020-04-15 ENCOUNTER — Other Ambulatory Visit (HOSPITAL_COMMUNITY): Payer: Self-pay | Admitting: Interventional Radiology

## 2020-04-15 ENCOUNTER — Telehealth: Payer: Self-pay | Admitting: Cardiovascular Disease

## 2020-04-15 ENCOUNTER — Telehealth (HOSPITAL_COMMUNITY): Payer: Self-pay

## 2020-04-15 ENCOUNTER — Telehealth: Payer: Self-pay

## 2020-04-15 DIAGNOSIS — I771 Stricture of artery: Secondary | ICD-10-CM

## 2020-04-15 NOTE — Telephone Encounter (Signed)
   [facility] Pre-operative Risk Assessment   Request for surgical clearance:  1. What type of surgery is being performed? DIAGNOSTIC ANGIOGRAM   2. When is this surgery scheduled? 04-23-2020   3. What type of clearance is required (medical clearance vs. Pharmacy clearance to hold med vs. Both)? PHARMACY  4. Are there any medications that need to be held prior to surgery and how long? ELIQUIS 48 HOURS PRIOR   5. Practice name and name of physician performing surgery? [facility]   6. What is the office phone number? [phone]   7.   What is the office fax number? [phone]  8.   Anesthesia type (None, local, MAC, general) ? NOT LISTED-[id number]

## 2020-04-15 NOTE — Telephone Encounter (Signed)
[name] is calling wanting to know if Dr. Gwenlyn Found can write him a prescription to get a scooter due to him having a lot of trouble getting around. Please advise.

## 2020-04-15 NOTE — Telephone Encounter (Signed)
Thanks fine with me

## 2020-04-15 NOTE — Telephone Encounter (Signed)
Called to schedule f/u angiogram, no answer, left vm. AW 

## 2020-04-16 NOTE — Telephone Encounter (Signed)
Called patient no answer.LMTC. 

## 2020-04-16 NOTE — Telephone Encounter (Signed)
Caryl Pina from Dr. Arlean Hopping office returning call. She states anesthesia type is moderate sedation.

## 2020-04-16 NOTE — Telephone Encounter (Signed)
Spoke to patient-he needs a prescription for the scooter with MD ID# on it and ICD code.

## 2020-04-16 NOTE — Telephone Encounter (Signed)
Per Dr. Kennon Holter note on 12/25/19, prefers to hold 2 days prior to angiogram

## 2020-04-16 NOTE — Telephone Encounter (Signed)
Spoke to patient 9/27.Letter stating patient requires the use of a scooter signed by Dr.Berry and left at front desk for pick up.

## 2020-04-16 NOTE — Telephone Encounter (Signed)
Patient with diagnosis of paroxysmal A Fib on Eliquis for anticoagulation.    Procedure: Diagnostic angiogram Date of procedure: 04/23/20  CHADS2-VASc score of  6 ( HTN, AGE, DM2, stroke/tia x 2, CAD)  CrCl 65.16mL/min using adjusted body weight Platelet count: no current labs  Per office protocol, patient can hold Eliquis for 2 days prior to procedure.     If not bridging, patient should restart Eliquis on the evening of procedure or day after, at discretion of procedure MD

## 2020-04-16 NOTE — Telephone Encounter (Signed)
Patient is calling to follow up. He states he additional information is needed for scooter eligibility.

## 2020-04-16 NOTE — Telephone Encounter (Signed)
Spoke to patient letter for scooter with Dr.Berry's NPI # and ICD 10 code faxed to [facility] in [place] fax # [phone].

## 2020-04-18 ENCOUNTER — Other Ambulatory Visit: Payer: Self-pay | Admitting: Radiology

## 2020-04-18 DIAGNOSIS — I739 Peripheral vascular disease, unspecified: Secondary | ICD-10-CM | POA: Diagnosis not present

## 2020-04-22 ENCOUNTER — Other Ambulatory Visit: Payer: Self-pay | Admitting: Student

## 2020-04-22 ENCOUNTER — Other Ambulatory Visit: Payer: Self-pay | Admitting: Physician Assistant

## 2020-04-23 ENCOUNTER — Ambulatory Visit (HOSPITAL_COMMUNITY)
Admission: RE | Admit: 2020-04-23 | Discharge: 2020-04-23 | Disposition: A | Discharge status: Home / Self Care | Payer: Medicare HMO | Source: Ambulatory Visit | Attending: Interventional Radiology | Admitting: Interventional Radiology

## 2020-04-23 ENCOUNTER — Other Ambulatory Visit: Payer: Self-pay

## 2020-04-23 ENCOUNTER — Other Ambulatory Visit (HOSPITAL_COMMUNITY): Payer: Self-pay | Admitting: Interventional Radiology

## 2020-04-23 DIAGNOSIS — Z8673 Personal history of transient ischemic attack (TIA), and cerebral infarction without residual deficits: Secondary | ICD-10-CM | POA: Diagnosis not present

## 2020-04-23 DIAGNOSIS — E1140 Type 2 diabetes mellitus with diabetic neuropathy, unspecified: Secondary | ICD-10-CM | POA: Diagnosis not present

## 2020-04-23 DIAGNOSIS — I771 Stricture of artery: Secondary | ICD-10-CM

## 2020-04-23 DIAGNOSIS — Z7901 Long term (current) use of anticoagulants: Secondary | ICD-10-CM | POA: Insufficient documentation

## 2020-04-23 DIAGNOSIS — I651 Occlusion and stenosis of basilar artery: Secondary | ICD-10-CM | POA: Diagnosis not present

## 2020-04-23 DIAGNOSIS — E785 Hyperlipidemia, unspecified: Secondary | ICD-10-CM | POA: Insufficient documentation

## 2020-04-23 DIAGNOSIS — I2510 Atherosclerotic heart disease of native coronary artery without angina pectoris: Secondary | ICD-10-CM | POA: Insufficient documentation

## 2020-04-23 DIAGNOSIS — G450 Vertebro-basilar artery syndrome: Secondary | ICD-10-CM | POA: Insufficient documentation

## 2020-04-23 DIAGNOSIS — I252 Old myocardial infarction: Secondary | ICD-10-CM | POA: Diagnosis not present

## 2020-04-23 DIAGNOSIS — I10 Essential (primary) hypertension: Secondary | ICD-10-CM | POA: Insufficient documentation

## 2020-04-23 DIAGNOSIS — Z7902 Long term (current) use of antithrombotics/antiplatelets: Secondary | ICD-10-CM | POA: Diagnosis not present

## 2020-04-23 DIAGNOSIS — Z79899 Other long term (current) drug therapy: Secondary | ICD-10-CM | POA: Insufficient documentation

## 2020-04-23 DIAGNOSIS — E1151 Type 2 diabetes mellitus with diabetic peripheral angiopathy without gangrene: Secondary | ICD-10-CM | POA: Insufficient documentation

## 2020-04-23 DIAGNOSIS — Z794 Long term (current) use of insulin: Secondary | ICD-10-CM | POA: Insufficient documentation

## 2020-04-23 DIAGNOSIS — T82858A Stenosis of vascular prosthetic devices, implants and grafts, initial encounter: Secondary | ICD-10-CM | POA: Diagnosis not present

## 2020-04-23 HISTORY — PX: IR ANGIO VERTEBRAL SEL VERTEBRAL UNI L MOD SED: IMG5367

## 2020-04-23 HISTORY — PX: IR ANGIO INTRA EXTRACRAN SEL COM CAROTID INNOMINATE BILAT MOD SED: IMG5360

## 2020-04-23 LAB — CBC
HCT: 47 % (ref 39.0–52.0)
Hemoglobin: 14.7 g/dL (ref 13.0–17.0)
MCH: 28.4 pg (ref 26.0–34.0)
MCHC: 31.3 g/dL (ref 30.0–36.0)
MCV: 90.7 fL (ref 80.0–100.0)
Platelets: 318 10*3/uL (ref 150–400)
RBC: 5.18 MIL/uL (ref 4.22–5.81)
RDW: 14.6 % (ref 11.5–15.5)
WBC: 9.1 10*3/uL (ref 4.0–10.5)
nRBC: 0 % (ref 0.0–0.2)

## 2020-04-23 LAB — BASIC METABOLIC PANEL
Anion gap: 11 (ref 5–15)
BUN: 18 mg/dL (ref 8–23)
CO2: 24 mmol/L (ref 22–32)
Calcium: 9.6 mg/dL (ref 8.9–10.3)
Chloride: 104 mmol/L (ref 98–111)
Creatinine, Ser: 1.45 mg/dL — ABNORMAL HIGH (ref 0.61–1.24)
GFR calc non Af Amer: 50 mL/min — ABNORMAL LOW (ref >60)
Glucose, Bld: 184 mg/dL — ABNORMAL HIGH (ref 70–99)
Potassium: 5.3 mmol/L — ABNORMAL HIGH (ref 3.5–5.1)
Sodium: 139 mmol/L (ref 135–145)

## 2020-04-23 LAB — GLUCOSE, CAPILLARY: Glucose-Capillary: 98 mg/dL (ref 70–99)

## 2020-04-23 LAB — PROTIME-INR
INR: 1 (ref 0.8–1.2)
Prothrombin Time: 13 seconds (ref 11.4–15.2)

## 2020-04-23 MED ORDER — HEPARIN SODIUM (PORCINE) 1000 UNIT/ML IJ SOLN
INTRAMUSCULAR | Status: AC
  Filled 2020-04-23: qty 1

## 2020-04-23 MED ORDER — LIDOCAINE HCL 1 % IJ SOLN
INTRAMUSCULAR | Status: AC | PRN
  Administered 2020-04-23: 10 mL

## 2020-04-23 MED ORDER — HEPARIN SODIUM (PORCINE) 1000 UNIT/ML IJ SOLN
INTRAMUSCULAR | Status: AC
  Administered 2020-04-23: 1000 mL via INTRAVENOUS
  Filled 2020-04-23: qty 1

## 2020-04-23 MED ORDER — SODIUM CHLORIDE 0.9 % IV SOLN: INTRAVENOUS | Status: AC

## 2020-04-23 MED ORDER — MIDAZOLAM HCL 2 MG/2ML IJ SOLN
INTRAMUSCULAR | Status: AC
  Filled 2020-04-23: qty 2

## 2020-04-23 MED ORDER — IOHEXOL 300 MG/ML  SOLN
150.0000 mL | Freq: Once | INTRAMUSCULAR | Status: AC | PRN
  Administered 2020-04-23: 75 mL via INTRA_ARTERIAL

## 2020-04-23 MED ORDER — SODIUM CHLORIDE 0.9 % IV SOLN: Freq: Once | INTRAVENOUS | Status: DC

## 2020-04-23 MED ORDER — NITROGLYCERIN 1 MG/10 ML FOR IR/CATH LAB
INTRA_ARTERIAL | Status: AC
  Filled 2020-04-23: qty 10

## 2020-04-23 MED ORDER — LIDOCAINE HCL 1 % IJ SOLN
INTRAMUSCULAR | Status: AC
  Filled 2020-04-23: qty 20

## 2020-04-23 MED ORDER — FENTANYL CITRATE (PF) 100 MCG/2ML IJ SOLN
INTRAMUSCULAR | Status: AC | PRN
  Administered 2020-04-23: 25 ug via INTRAVENOUS

## 2020-04-23 MED ORDER — HYDRALAZINE HCL 20 MG/ML IJ SOLN
INTRAMUSCULAR | Status: AC
  Administered 2020-04-23: 5 mg via INTRAVENOUS
  Filled 2020-04-23: qty 1

## 2020-04-23 MED ORDER — VERAPAMIL HCL 2.5 MG/ML IV SOLN
INTRAVENOUS | Status: AC
  Filled 2020-04-23: qty 2

## 2020-04-23 MED ORDER — FENTANYL CITRATE (PF) 100 MCG/2ML IJ SOLN
INTRAMUSCULAR | Status: AC
  Filled 2020-04-23: qty 2

## 2020-04-23 NOTE — Discharge Instructions (Signed)
Moderate Conscious Sedation, Adult, Care After These instructions provide you with information about caring for yourself after your procedure. Your health care provider may also give you more specific instructions. Your treatment has been planned according to current medical practices, but problems sometimes occur. Call your health care provider if you have any problems or questions after your procedure. What can I expect after the procedure? After your procedure, it is common:  To feel sleepy for several hours.  To feel clumsy and have poor balance for several hours.  To have poor judgment for several hours.  To vomit if you eat too soon. Follow these instructions at home: For at least 24 hours after the procedure:   Do not: ? Participate in activities where you could fall or become injured. ? Drive. ? Use heavy machinery. ? Drink alcohol. ? Take sleeping pills or medicines that cause drowsiness. ? Make important decisions or sign legal documents. ? Take care of children on your own.  Rest. Eating and drinking  Follow the diet recommended by your health care provider.  If you vomit: ? Drink water, juice, or soup when you can drink without vomiting. ? Make sure you have little or no nausea before eating solid foods. General instructions  Have a responsible adult stay with you until you are awake and alert.  Take over-the-counter and prescription medicines only as told by your health care provider.  If you smoke, do not smoke without supervision.  Keep all follow-up visits as told by your health care provider. This is important. Contact a health care provider if:  You keep feeling nauseous or you keep vomiting.  You feel light-headed.  You develop a rash.  You have a fever. Get help right away if:  You have trouble breathing. This information is not intended to replace advice given to you by your health care provider. Make sure you discuss any questions you have  with your health care provider. Document Revised: 06/18/2017 Document Reviewed: 10/26/2015 Elsevier Patient Education  [place] After This sheet gives you information about how to care for yourself after your procedure. Your doctor may also give you more specific instructions. If you have problems or questions, contact your doctor. Follow these instructions at home: Insertion site care  Follow instructions from your doctor about how to take care of your long, thin tube (catheter) insertion area. Make sure you: ? Wash your hands with soap and water before you change your bandage (dressing). If you cannot use soap and water, use hand sanitizer. ? Remove your bandage as told by your doctor. In 24 hours ? Leave stitches (sutures), skin glue, or skin tape (adhesive) strips in place. They may need to stay in place for 2 weeks or longer. If tape strips get loose and curl up, you may trim the loose edges. Do not remove tape strips completely unless your doctor says it is okay.  Do not take baths, swim, or use a hot tub until your doctor says it is okay.  You may shower 24-48 hours after the procedure or as told by your doctor. ? Gently wash the area with plain soap and water. ? Pat the area dry with a clean towel. ? Do not rub the area. This may cause bleeding.  Do not apply powder or lotion to the area. Keep the area clean and dry.  Check your insertion area every day for signs of infection. Check for: ? More redness, swelling, or pain. ? Fluid  or blood. ? Warmth. ? Pus or a bad smell. Activity  Rest as told by your doctor, usually for 1-2 days.  Do not squat or climb stairs for 1-2 days.  Do not lift anything that is heavier than 10 lbs. (4.5 kg) or as told by your doctor. For 5 days  Do not drive for 24 hours if you were given a medicine to help you relax (sedative).  Do not drive or use heavy machinery while taking prescription pain medicine. General  instructions   Go back to your normal activities as told by your doctor, usually in about a week. Ask your doctor what activities are safe for you.  If the insertion area starts to bleed, lie flat and put pressure on the area. If the bleeding does not stop, get help right away. This is an emergency.  Drink enough fluid to keep your pee (urine) clear or pale yellow.  Take over-the-counter and prescription medicines only as told by your doctor.  Keep all follow-up visits as told by your doctor. This is important. Contact a doctor if:  You have a fever.  You have chills.  You have more redness, swelling, or pain around your insertion area.  You have fluid or blood coming from your insertion area.  The insertion area feels warm to the touch.  You have pus or a bad smell coming from your insertion area.  You have more bruising around the insertion area.  Blood collects in the tissue around the insertion area (hematoma) that may be painful to the touch. Get help right away if:  You have a lot of pain in the insertion area.  The insertion area swells very fast.  The insertion area is bleeding, and the bleeding does not stop after holding steady pressure on the area.  The area near or just beyond the insertion area becomes pale, cool, tingly, or numb. These symptoms may be an emergency. Do not wait to see if the symptoms will go away. Get medical help right away. Call your local emergency services (911 in the [place] Do not drive yourself to the hospital. Summary  After the procedure, it is common to have bruising and tenderness at the long, thin tube insertion area.  After the procedure, it is important to rest and drink plenty of fluids.  Do not take baths, swim, or use a hot tub until your doctor says it is okay to do so. You may shower 24-48 hours after the procedure or as told by your doctor.  If the insertion area starts to bleed, lie flat and put pressure on the area. If  the bleeding does not stop, get help right away. This is an emergency. This information is not intended to replace advice given to you by your health care provider. Make sure you discuss any questions you have with your health care provider. Document Revised: 06/18/2017 Document Reviewed: 06/30/2016 Elsevier Patient Education  2020 [employer].

## 2020-04-23 NOTE — Procedures (Signed)
S/Pbilateral common carotid and Lt VA angiograms. Rt CFA approach. Findings. 1.Wide patency of basilar artery stent. S.Kieren Adkison MD

## 2020-04-23 NOTE — H&P (Signed)
Chief Complaint: Patient was seen in consultation today for a diagnostic cerebral angiogram.  Referring Physician(s): Deveshwar,Sanjeev  Supervising Physician: Luanne Bras  Patient Status: [facility] - Out-pt  History of Present Illness: [name] is a 64 y.o. male with a past medical history of significant for arthritis, DM, DVT, CAD, HTN, HLD, MI, PVD and CVA who presents today for a diagnostic cerebral angiogram. 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 He has been followed regularly by NIR via imaging and several diagnostic cerebral angiograms. Most recent CTA on 01/17/20 noted several areas of stenosis ranging from mild to severe, a chronically occluded right vertebral artery with stable faint retrograde reconstitution from the vertebrobasilar junction to the right PICA and stable basilar artery stent with no in-stent stenosis. He presents today for a diagnostic cerebral angiogram to further evaluate these findings.  [name] denies any complaints today, he is hoping the procedure can be done through his wrist - he tells me it was done like this initially last time but there were complications when the catheter was advanced to his neck so the procedure was stopped and instead done through the groin. He has been taking Plavix and Eliquis however he hasn't taken his Eliquis in 3 days. He is familiar with procedure today and is agreeable to proceed as planned.  Past Medical History:  Diagnosis Date  . Arthritis    "hx; cleaned it out of both shoulders"  . CAD (coronary artery disease)    OV, Dr Harlow Asa, [facility] 5/12 on chart  EKG 10/12 EPIC,  chest x ray 01/07/11 EPIC  . Carpal tunnel syndrome    peripheral neuropathy  . Chronic shoulder pain    "both"  . DVT (deep venous thrombosis) (HCC)    hx LLE  . History of kidney  stones   . Hyperlipidemia   . Hypertension   . Myocardial infarction ([place]) 02/2001  . Neuropathy, peripheral    both feet  . Peripheral vascular disease, unspecified ([place]) 03/2015   PCI to the right popliteal  . Pseudobulbar affect   . Skin cancer    "have had them cut or burned off my face" (03/28/2015)  . Stroke ([place]) 10/10/2015  . Type II diabetes mellitus ([place])     Past Surgical History:  Procedure Laterality Date  . APPENDECTOMY  1977  . CARDIAC CATHETERIZATION  2002      . CARPAL TUNNEL RELEASE Right 2000's  . CARPAL TUNNEL RELEASE  12/18/2011   Procedure: CARPAL TUNNEL RELEASE;  Surgeon: Mcarthur Rossetti, MD;  Location: [facility];  Service: Orthopedics;  Laterality: Left;  Left Open Carpal Tunnel Release  . CORONARY ANGIOPLASTY    . CORONARY ARTERY BYPASS GRAFT  2002   CABG X 4  . CYSTOSCOPY  several done in past  . FEMORAL-TIBIAL BYPASS GRAFT Left 01/07/11   fem-posterior tibial BPG using reversed left GSV               12/15/11 OK BY DR Ninfa Linden TO CONTINUE ASA AND PLAVIX  . FEMORAL-TIBIAL BYPASS GRAFT Left 09/06/2015   Procedure: LEFT FEMORAL-POSTERIOR TIBIAL ARTERY BYPASS GRAFT WITH COMPOSITE PTFE AND RIGHT ARM VEIN;  Surgeon: Elam Dutch, MD;  Location: [place];  Service: Vascular;  Laterality: Left;  . FEMOROPOPLITEAL THROMBECTOMY / EMBOLECTOMY  ~ 2010  . FRACTURE SURGERY    .  IR ANGIO INTRA EXTRACRAN SEL COM CAROTID INNOMINATE BILAT MOD SED  03/02/2017  . IR ANGIO VERTEBRAL SEL SUBCLAVIAN INNOMINATE UNI R MOD SED  08/16/2018  . IR ANGIO VERTEBRAL SEL VERTEBRAL UNI L MOD SED  03/02/2017  . IR ANGIO VERTEBRAL SEL VERTEBRAL UNI L MOD SED  08/16/2018  . IR ANGIOGRAM EXTREMITY RIGHT  03/02/2017  . IR GENERIC HISTORICAL  02/18/2016   IR RADIOLOGIST EVAL & MGMT 02/18/2016 [facility]  . IR GENERIC HISTORICAL  07/30/2016   IR ANGIO VERTEBRAL SEL VERTEBRAL UNI L MOD SED 07/30/2016 Luanne Bras, MD [facility]  . IR GENERIC HISTORICAL  07/30/2016   IR ANGIO INTRA EXTRACRAN  SEL COM CAROTID INNOMINATE BILAT MOD SED 07/30/2016 Luanne Bras, MD [facility]  . IR US GUIDE VASC ACCESS RIGHT  08/16/2018  . KNEE ARTHROSCOPY Left X 2  . LAPAROSCOPIC CHOLECYSTECTOMY  1990's  . LITHOTRIPSY  several done in past  . ORIF RADIUS & ULNA FRACTURES Left   . PERIPHERAL VASCULAR CATHETERIZATION N/A 03/28/2015   Procedure: Lower Extremity Angiography;  Surgeon: Lorretta Harp, MD;  Location: [facility];  Service: Cardiovascular;  Laterality: N/A;  . PERIPHERAL VASCULAR CATHETERIZATION Right 03/28/2015   Procedure: Peripheral Vascular Atherectomy;  Surgeon: Lorretta Harp, MD;  Location: [facility];  Service: Cardiovascular;  Laterality: Right;  popliteal;   . PERIPHERAL VASCULAR CATHETERIZATION N/A 08/23/2015   Procedure: Abdominal Aortogram;  Surgeon: Elam Dutch, MD;  Location: [facility];  Service: Cardiovascular;  Laterality: N/A;  . POPLITEAL ARTERY STENT Left 2010-2012 X 4  . RADIOLOGY WITH ANESTHESIA N/A 02/04/2016   Procedure: Basilar artery angioplasty with stenting;  Surgeon: Luanne Bras, MD;  Location: [facility];  Service: Radiology;  Laterality: N/A;  . SHOULDER ARTHROSCOPY Left   . SHOULDER ARTHROSCOPY Right 12/18/2011  . SHOULDER ARTHROSCOPY  07/08/2012   Procedure: ARTHROSCOPY SHOULDER;  Surgeon: Mcarthur Rossetti, MD;  [facility];  Service: Orthopedics;  Laterality: Left;  Left Shoulder Arthroscopy with Manipulation and Extensive Debridement  . SHOULDER ARTHROSCOPY WITH ROTATOR CUFF REPAIR Left 07/14/2013   Procedure: LEFT SHOULDER ARTHROSCOPY WITH EXTENSIVE DEBRIDEMENT, DISTAL CLAVICLE REPAIR;  Surgeon: Mcarthur Rossetti, MD;  [facility];  Service: Orthopedics;  Laterality: Left;  . SKIN CANCER EXCISION     "left side of my forehead"  . VEIN HARVEST Right 09/06/2015   Procedure: RIGHT ARM VEIN HARVEST;  Surgeon: Elam Dutch, MD;  Location: [facility];  Service: Vascular;  Laterality: Right;     Allergies: Patient has no known allergies.  Medications: Prior to Admission medications   Medication Sig Start Date End Date Taking? Authorizing Provider  atorvastatin (LIPITOR) 40 MG tablet Take 40 mg by mouth daily.   Yes [provider]  BD INSULIN SYRINGE U/F 31G X 5/16" 1 ML MISC AS DIRECTED 3 TIMES A DAY SUBCUTANEOUS 30 09/07/19  Yes [provider]  clopidogrel (PLAVIX) 75 MG tablet TAKE 1 TABLET BY MOUTH EVERY DAY Patient taking differently: Take 75 mg by mouth daily.  12/21/19  Yes Lorretta Harp, MD  ELIQUIS 5 MG TABS tablet TAKE 1 TABLET BY MOUTH TWICE A DAY Patient taking differently: Take 5 mg by mouth 2 (two) times daily.  07/06/19  Yes Lorretta Harp, MD  empagliflozin (JARDIANCE) 10 MG TABS tablet Take 10 mg by mouth daily.   Yes [provider]  fenofibrate (TRICOR) 145 MG tablet TAKE 1 TABLET (145 MG TOTAL) BY MOUTH DAILY.  PLEASE SCHEDULE APPOINTMENT FOR REFILLS. 05/29/19  Yes Lorretta Harp, MD  furosemide (LASIX) 40 MG tablet TAKE 1 TABLET BY MOUTH TWICE A DAY Patient taking differently: Take 40 mg by mouth 2 (two) times daily.  12/12/19  Yes Hilty, Nadean Corwin, MD  gabapentin (NEURONTIN) 300 MG capsule Take 300 mg by mouth 2 (two) times daily.    Yes [provider]  insulin NPH-regular Human (70-30) 100 UNIT/ML injection Inject 65-75 Units into the skin See admin instructions. Inject 65-75 units with breakfast, 45-65 units at lunch and 75 units at dinner   Yes [provider]  KLOR-CON M20 20 MEQ tablet TAKE 1 TABLET BY MOUTH TWICE A DAY Patient taking differently: Take 20 mEq by mouth 2 (two) times daily.  03/04/20  Yes Hilty, Nadean Corwin, MD  metFORMIN (GLUCOPHAGE) 500 MG tablet Take 500 mg by mouth 2 (two) times daily. 02/18/20  Yes [provider]  metoprolol succinate (TOPROL-XL) 25 MG 24 hr tablet TAKE 1 TABLET (25 MG TOTAL) BY MOUTH DAILY. *DUE 9/17* Patient taking differently: Take 25 mg by mouth daily.   02/15/20  Yes Lorretta Harp, MD  ONETOUCH VERIO test strip AS DIRECTED 3 TIMES A DAY 30 08/13/19  Yes [provider]  VASCEPA 1 g capsule TAKE 2 CAPSULES (2 G TOTAL) BY MOUTH 2 (TWO) TIMES A DAY. Patient taking differently: Take 2 g by mouth 2 (two) times daily.  02/19/20  Yes Hilty, Nadean Corwin, MD  lisinopril (PRINIVIL,ZESTRIL) 20 MG tablet Take 1 tablet (20 mg total) by mouth daily. Patient not taking: Reported on 04/17/2020 08/12/18 04/17/20  Almyra Deforest, PA  metFORMIN (GLUCOPHAGE-XR) 500 MG 24 hr tablet Take 1 tablet (500 mg total) by mouth 2 (two) times daily. Patient not taking: Reported on 04/17/2020 02/01/16   Modena Jansky, MD  metolazone (ZAROXOLYN) 2.5 MG tablet TAKE 1 TABLET ONCE A WEEK ON MONDAYS, MAY TAKE AN EXTRA TABLET IF WT REACHES 257LBS Patient not taking: Reported on 04/17/2020 11/16/19   Almyra Deforest, PA     Family History  Problem Relation Age of Onset  . Cancer Mother        Breast and Brain tumor  . Cancer Father        Blood vessel tumor    Social History   Socioeconomic History  . Marital status: Widowed    Spouse name: Not on file  . Number of children: Not on file  . Years of education: Not on file  . Highest education level: Not on file  Occupational History  . Not on file  Tobacco Use  . Smoking status: Never Smoker  . Smokeless tobacco: Never Used  Substance and Sexual Activity  . Alcohol use: No  . Drug use: No  . Sexual activity: Yes  Other Topics Concern  . Not on file  Social History Narrative  . Not on file   Social Determinants of Health   Financial Resource Strain:   . Difficulty of Paying Living Expenses: Not on file  Food Insecurity:   . Worried About Charity fundraiser in the Last Year: Not on file  . Ran Out of Food in the Last Year: Not on file  Transportation Needs:   . Lack of Transportation (Medical): Not on file  . Lack of Transportation (Non-Medical): Not on file  Physical Activity:   . Days of Exercise per Week:  Not on file  . Minutes of Exercise per Session: Not on file  Stress:   .  Feeling of Stress : Not on file  Social Connections:   . Frequency of Communication with Friends and Family: Not on file  . Frequency of Social Gatherings with Friends and Family: Not on file  . Attends Religious Services: Not on file  . Active Member of Clubs or Organizations: Not on file  . Attends Archivist Meetings: Not on file  . Marital Status: Not on file     Review of Systems: A 12 point ROS discussed and pertinent positives are indicated in the HPI above.  All other systems are negative.  Review of Systems  Constitutional: Negative for chills and fever.  Respiratory: Negative for cough and shortness of breath.   Cardiovascular: Negative for chest pain.  Gastrointestinal: Negative for abdominal pain, diarrhea, nausea and vomiting.  Musculoskeletal: Negative for back pain.  Neurological: Negative for headaches.    Vital Signs: BP (!) 190/85   Pulse 64   Temp 98.3 F (36.8 C) (Oral)   Resp (!) 22   Ht 5\' 10"  (1.778 m)   Wt 250 lb (113.4 kg)   SpO2 98%   BMI 35.87 kg/m   Physical Exam Vitals reviewed.  Constitutional:      General: He is not in acute distress. HENT:     Head: Normocephalic.     Mouth/Throat:     Mouth: Mucous membranes are moist.     Pharynx: Oropharynx is clear. No oropharyngeal exudate or posterior oropharyngeal erythema.  Cardiovascular:     Rate and Rhythm: Normal rate and regular rhythm.  Pulmonary:     Effort: Pulmonary effort is normal.     Breath sounds: Normal breath sounds.  Abdominal:     General: There is no distension.     Palpations: Abdomen is soft.     Tenderness: There is no abdominal tenderness.  Skin:    General: Skin is warm and dry.  Neurological:     Mental Status: He is alert and oriented to person, place, and time.  Psychiatric:        Mood and Affect: Mood normal.        Behavior: Behavior normal.        Thought Content:  Thought content normal.        Judgment: Judgment normal.      MD Evaluation Airway: WNL Heart: WNL Abdomen: WNL Chest/ Lungs: WNL ASA  Classification: 3 Mallampati/Airway Score: Two   Imaging: No results found.  Labs:  CBC: No results for input(s): WBC, HGB, HCT, PLT in the last 8760 hours.  COAGS: Recent Labs    04/23/20 0720  INR 1.0    BMP: Recent Labs    08/21/19 1045 01/16/20 1528  CREATININE 1.51* 1.50*  GFRNONAA 48*  --   GFRAA 55*  --     LIVER FUNCTION TESTS: No results for input(s): BILITOT, AST, ALT, ALKPHOS, PROT, ALBUMIN in the last 8760 hours.  TUMOR MARKERS: No results for input(s): AFPTM, CEA, CA199, CHROMGRNA in the last 8760 hours.  Assessment and Plan:  64 y/o M well known to [facility] due to previous basilar artery stenting (2017) who presents today for a diagnostic cerebral angiogram to further evaluate findings on most recent CTA.  Patient has been NPO since midnight, last dose of Plavix today and last dose of Eliquis 10/2. Afebrile, WBC 9.1, hgb 14.7, plt 318, INR 1.0, BMP pending.  Risks and benefits of diagnostic cerebral angiogram were discussed with the patient including, but not limited to bleeding, infection, vascular injury,  stroke, or contrast induced renal failure.  This interventional procedure involves the use of X-rays and because of the nature of the planned procedure, it is possible that we will have prolonged use of X-ray fluoroscopy. Potential radiation risks to you include (but are not limited to) the following: - A slightly elevated risk for cancer  several years later in life. This risk is typically less than 0.5% percent. This risk is low in comparison to the normal incidence of human cancer, which is 33% for women and 50% for men according to the [place]. - Radiation induced injury can include skin redness, resembling a rash, tissue breakdown / ulcers and hair loss (which can be temporary or permanent).   The likelihood of either of these occurring depends on the difficulty of the procedure and whether you are sensitive to radiation due to previous procedures, disease, or genetic conditions.  IF your procedure requires a prolonged use of radiation, you will be notified and given written instructions for further action.  It is your responsibility to monitor the irradiated area for the 2 weeks following the procedure and to notify your physician if you are concerned that you have suffered a radiation induced injury.    All of the patient's questions were answered, patient is agreeable to proceed.  Consent signed and in chart.  Thank you for this interesting consult.  I greatly enjoyed meeting [name] and look forward to participating in their care.  A copy of this report was sent to the requesting provider on this date.  Electronically Signed: Joaquim Nam, PA-C 04/23/2020, 7:55 AM   I spent a total of 15 Minutes in face to face in clinical consultation, greater than 50% of which was counseling/coordinating care for diagnostic cerebral angiogram.

## 2020-04-25 DIAGNOSIS — M47816 Spondylosis without myelopathy or radiculopathy, lumbar region: Secondary | ICD-10-CM | POA: Diagnosis not present

## 2020-04-25 DIAGNOSIS — R69 Illness, unspecified: Secondary | ICD-10-CM | POA: Diagnosis not present

## 2020-04-29 MED FILL — Heparin Sodium (Porcine) Inj 1000 Unit/ML: INTRAMUSCULAR | Qty: 1 | Status: AC

## 2020-05-08 ENCOUNTER — Ambulatory Visit: Payer: Medicare HMO | Admitting: Neurology

## 2020-05-13 DIAGNOSIS — G4733 Obstructive sleep apnea (adult) (pediatric): Secondary | ICD-10-CM | POA: Diagnosis not present

## 2020-05-14 ENCOUNTER — Other Ambulatory Visit: Payer: Self-pay

## 2020-05-14 ENCOUNTER — Encounter (HOSPITAL_BASED_OUTPATIENT_CLINIC_OR_DEPARTMENT_OTHER): Payer: Medicare HMO | Attending: Internal Medicine | Admitting: Internal Medicine

## 2020-05-14 DIAGNOSIS — E11622 Type 2 diabetes mellitus with other skin ulcer: Secondary | ICD-10-CM | POA: Insufficient documentation

## 2020-05-14 DIAGNOSIS — E1151 Type 2 diabetes mellitus with diabetic peripheral angiopathy without gangrene: Secondary | ICD-10-CM | POA: Insufficient documentation

## 2020-05-14 DIAGNOSIS — X58XXXD Exposure to other specified factors, subsequent encounter: Secondary | ICD-10-CM | POA: Insufficient documentation

## 2020-05-14 DIAGNOSIS — L97821 Non-pressure chronic ulcer of other part of left lower leg limited to breakdown of skin: Secondary | ICD-10-CM | POA: Diagnosis not present

## 2020-05-14 DIAGNOSIS — L97822 Non-pressure chronic ulcer of other part of left lower leg with fat layer exposed: Secondary | ICD-10-CM | POA: Diagnosis not present

## 2020-05-14 DIAGNOSIS — I872 Venous insufficiency (chronic) (peripheral): Secondary | ICD-10-CM | POA: Diagnosis not present

## 2020-05-14 DIAGNOSIS — I87323 Chronic venous hypertension (idiopathic) with inflammation of bilateral lower extremity: Secondary | ICD-10-CM | POA: Diagnosis not present

## 2020-05-14 DIAGNOSIS — S80812D Abrasion, left lower leg, subsequent encounter: Secondary | ICD-10-CM | POA: Insufficient documentation

## 2020-05-14 NOTE — Progress Notes (Signed)
[name], [name] ([id number]) Visit Report for 05/14/2020 Abuse/Suicide Risk Screen Details Patient Name: Date of Service: [name], [name] 05/14/2020 9:00 A M Medical Record Number: [id number] Patient Account Number: [id number] Date of Birth/Sex: Treating RN: 09-03-1955 (64 y.o. [name] Primary Care Brandi Tomlinson: Merita Norton ULT, PRO Ave Filter Other Clinician: Referring Tavi Hoogendoorn: Treating Janyiah Silveri/Extender: Pleas Koch, Bindubal Weeks in Treatment: 0 Abuse/Suicide Risk Screen Items Answer ABUSE RISK SCREEN: Has anyone close to you tried to hurt or harm you recentlyo No Do you feel uncomfortable with anyone in your familyo No Has anyone forced you do things that you didnt want to doo No Electronic Signature(s) Signed: 05/14/2020 5:15:49 PM By: Levan Hurst RN, BSN Entered By: Levan Hurst on 05/14/2020 09:33:27 -------------------------------------------------------------------------------- Activities of Daily Living Details Patient Name: Date of Service: [name], [name] 05/14/2020 9:00 A M Medical Record Number: [id number] Patient Account Number: [id number] Date of Birth/Sex: Treating RN: Jul 30, 1955 (64 y.o. [name] Primary Care Frazier Balfour: Merita Norton ULT, PRO Ave Filter Other Clinician: Referring Meredith Kilbride: Treating Keshawna Dix/Extender: Pleas Koch, Bindubal Weeks in Treatment: 0 Activities of Daily Living Items Answer Activities of Daily Living (Please select one for each item) Drive Automobile Completely Able T Medications ake Completely Able Use T elephone Completely Able Care for Appearance Completely Able Use T oilet Completely Able Bath / Shower Completely Able Dress Self Completely Able Feed Self Completely Able Walk Completely Able Get In / Out Bed Completely Able Housework Completely Able Prepare Meals Completely [place] for Self Completely Able Electronic Signature(s) Signed: 05/14/2020 5:15:49 PM By: Levan Hurst  RN, BSN Entered By: Levan Hurst on 05/14/2020 09:33:53 -------------------------------------------------------------------------------- Education Screening Details Patient Name: Date of Service: [name], [name] [place] A. 05/14/2020 9:00 A M Medical Record Number: [id number] Patient Account Number: [id number] Date of Birth/Sex: Treating RN: 1956/06/27 (64 y.o. [name] Primary Care Rachal Dvorsky: Merita Norton ULT, PRO V IDER Other Clinician: Referring Yailyn Strack: Treating Mehkai Gallo/Extender: Pleas Koch, Loura Back in Treatment: 0 Primary Learner Assessed: Patient Learning Preferences/Education Level/Primary Language Learning Preference: Explanation, Demonstration, Printed Material Highest Education Level: High School Preferred Language: English Cognitive Barrier Language Barrier: No Translator Needed: No Memory Deficit: No Emotional Barrier: No Cultural/Religious Beliefs Affecting Medical Care: No Physical Barrier Impaired Vision: No Impaired Hearing: No Decreased Hand dexterity: No Knowledge/Comprehension Knowledge Level: High Comprehension Level: High Ability to understand written instructions: High Ability to understand verbal instructions: High Motivation Anxiety Level: Calm Cooperation: Cooperative Education Importance: Acknowledges Need Interest in Health Problems: Asks Questions Perception: Coherent Willingness to Engage in Self-Management High Activities: Readiness to Engage in Self-Management High Activities: Electronic Signature(s) Signed: 05/14/2020 5:15:49 PM By: Levan Hurst RN, BSN Entered By: Levan Hurst on 05/14/2020 09:34:12 -------------------------------------------------------------------------------- Fall Risk Assessment Details Patient Name: Date of Service: [name], [name] 05/14/2020 9:00 A M Medical Record Number: [id number] Patient Account Number: [id number] Date of Birth/Sex: Treating RN: 09-01-1955 (64 y.o. [name] Primary  Care Kristia Jupiter: Merita Norton ULT, PRO V IDER Other Clinician: Referring Kayleah Appleyard: Treating Shanitra Phillippi/Extender: Pleas Koch, Bindubal Weeks in Treatment: 0 Fall Risk Assessment Items Have you had 2 or more falls in the last 12 monthso 0 No Have you had any fall that resulted in injury in the last 12 monthso 0 No FALLS RISK SCREEN History of falling - immediate or within 3 months 0 No Secondary diagnosis (Do you have 2 or more medical diagnoseso) 15 Yes Ambulatory aid None/bed rest/wheelchair/nurse 0 Yes Crutches/cane/walker 0 No Furniture 0 No Intravenous  therapy Access/Saline/Heparin Lock 0 No Gait/Transferring Normal/ bed rest/ wheelchair 0 Yes Weak (short steps with or without shuffle, stooped but able to lift head while walking, may seek 0 No support from furniture) Impaired (short steps with shuffle, may have difficulty arising from chair, head down, impaired 0 No balance) Mental Status Oriented to own ability 0 Yes Electronic Signature(s) Signed: 05/14/2020 5:15:49 PM By: Levan Hurst RN, BSN Entered By: Levan Hurst on 05/14/2020 09:34:55 -------------------------------------------------------------------------------- Foot Assessment Details Patient Name: Date of Service: [name], [name] [place] RK A. 05/14/2020 9:00 [place] Record Number: [id number] Patient Account Number: [id number] Date of Birth/Sex: Treating RN: 1955-11-20 (64 y.o. [name] Primary Care Cayleigh Paull: Merita Norton ULT, PRO V IDER Other Clinician: Referring Nathasha Fiorillo: Treating Ladye Macnaughton/Extender: Pleas Koch, Bindubal Weeks in Treatment: 0 Foot Assessment Items Site Locations + = Sensation present, - = Sensation absent, C = Callus, U = Ulcer R = Redness, W = Warmth, M = Maceration, PU = Pre-ulcerative lesion F = Fissure, S = Swelling, D = Dryness Assessment Right: Left: Other Deformity: No No Prior Foot Ulcer: No No Prior Amputation: No No Charcot Joint: No No Ambulatory Status: Ambulatory  Without Help Gait: Steady Electronic Signature(s) Signed: 05/14/2020 5:15:49 PM By: Levan Hurst RN, BSN Entered By: Levan Hurst on 05/14/2020 09:43:44 -------------------------------------------------------------------------------- Nutrition Risk Screening Details Patient Name: Date of Service: [name], [name] [place] A. 05/14/2020 9:00 A M Medical Record Number: [id number] Patient Account Number: [id number] Date of Birth/Sex: Treating RN: 08-09-1955 (64 y.o. [name] Primary Care Cristela Stalder: Merita Norton ULT, PRO V IDER Other Clinician: Referring Gerrit Rafalski: Treating Noelani Harbach/Extender: Pleas Koch, Bindubal Weeks in Treatment: 0 Height (in): 70 Weight (lbs): 250 Body Mass Index (BMI): 35.9 Nutrition Risk Screening Items Score Screening NUTRITION RISK SCREEN: I have an illness or condition that made me change the kind and/or amount of food I eat 0 No I eat fewer than two meals per day 0 No I eat few fruits and vegetables, or milk products 0 No I have three or more drinks of beer, liquor or wine almost every day 0 No I have tooth or mouth problems that make it hard for me to eat 0 No I don't always have enough money to buy the food I need 0 No I eat alone most of the time 0 No I take three or more different prescribed or over-the-counter drugs a day 1 Yes Without wanting to, I have lost or gained 10 pounds in the last six months 0 No I am not always physically able to shop, cook and/or feed myself 2 Yes Nutrition Protocols Good Risk Protocol Moderate Risk Protocol 0 Provide education on nutrition High Risk Proctocol Risk Level: Moderate Risk Score: 3 Electronic Signature(s) Signed: 05/14/2020 5:15:49 PM By: Levan Hurst RN, BSN Entered By: Levan Hurst on 05/14/2020 09:35:03

## 2020-05-16 NOTE — Progress Notes (Signed)
[name], [name] ([id number]) Visit Report for 05/14/2020 Allergy List Details Patient Name: Date of Service: [name], [name]. 05/14/2020 9:00 A M Medical Record Number: [id number] Patient Account Number: [id number] Date of Birth/Sex: Treating RN: Jul [birth year], [name] (64 y.o. Male) Levan Hurst Primary Care Taim Wurm: Merita Norton ULT, PRO V IDER Other Clinician: Referring Miko Markwood: Treating Derion Kreiter/Extender: Pleas Koch, Bindubal Weeks in Treatment: 0 Allergies Active Allergies No Known Drug Allergies Allergy Notes Electronic Signature(s) Signed: 05/14/2020 5:15:49 PM By: Levan Hurst RN, BSN Entered By: Levan Hurst on 05/14/2020 09:18:42 -------------------------------------------------------------------------------- [place] Details Patient Name: Date of Service: [name], [name] A. 05/14/2020 9:00 A M Medical Record Number: [id number] Patient Account Number: [id number] Date of Birth/Sex: Treating RN: [birth year][name] (64 y.o. Male) Levan Hurst Primary Care Kassem Kibbe: Merita Norton ULT, PRO V IDER Other Clinician: Referring Draco Malczewski: Treating Nycere Presley/Extender: Pleas Koch, Bindubal Weeks in Treatment: 0 Visit Information Patient Arrived: Ambulatory Arrival Time: 09:16 Accompanied By: spouse Transfer Assistance: None Patient Identification Verified: Yes Secondary Verification Process Completed: Yes Patient Requires Transmission-Based Precautions: No Patient Has Alerts: Yes Patient Alerts: Patient on Blood Thinner Electronic Signature(s) Signed: 05/14/2020 5:15:49 PM By: Levan Hurst RN, BSN Entered By: Levan Hurst on 05/14/2020 09:18:13 -------------------------------------------------------------------------------- Clinic Level of Care Assessment Details Patient Name: Date of Service: [name], [name] 05/14/2020 9:00 A M Medical Record Number: [id number] Patient Account Number: [id number] Date of Birth/Sex: Treating RN: [name][birth year] (64 y.o. Male) Deon Pilling Primary  Care [place] Sigmund: Merita Norton ULT, PRO V IDER Other Clinician: Referring Dayzee Trower: Treating Quaneshia Wareing/Extender: Pleas Koch, Bindubal Weeks in Treatment: 0 Clinic Level of Care Assessment Items TOOL 1 Quantity Score X- 1 0 Use when EandM and Procedure is performed on INITIAL visit ASSESSMENTS - Nursing Assessment / Reassessment X- 1 20 General Physical Exam (combine w/ comprehensive assessment (listed just below) when performed on new pt. evals) X- 1 25 Comprehensive Assessment (HX, ROS, Risk Assessments, Wounds Hx, etc.) ASSESSMENTS - Wound and Skin Assessment / Reassessment X- 1 10 Dermatologic / Skin Assessment (not related to wound area) ASSESSMENTS - Ostomy and/or Continence Assessment and Care []  - 0 Incontinence Assessment and Management []  - 0 Ostomy Care Assessment and Management (repouching, etc.) PROCESS - Coordination of Care X - Simple Patient / Family Education for ongoing care 1 15 []  - 0 Complex (extensive) Patient / Family Education for ongoing care X- 1 10 Staff obtains Programmer, systems, Records, T Results / Process Orders est []  - 0 Staff telephones HHA, Nursing Homes / Clarify orders / etc []  - 0 Routine Transfer to another Facility (non-emergent condition) []  - 0 Routine Hospital Admission (non-emergent condition) X- 1 15 New Admissions / Biomedical engineer / Ordering NPWT Apligraf, etc. , []  - 0 Emergency Hospital Admission (emergent condition) PROCESS - Special Needs []  - 0 Pediatric / Minor Patient Management []  - 0 Isolation Patient Management []  - 0 Hearing / Language / Visual special needs []  - 0 Assessment of Community assistance (transportation, D/C planning, etc.) []  - 0 Additional assistance / Altered mentation []  - 0 Support Surface(s) Assessment (bed, cushion, seat, etc.) INTERVENTIONS - Miscellaneous []  - 0 External ear exam []  - 0 Patient Transfer (multiple staff / Civil Service fast streamer / Similar devices) []  - 0 Simple Staple / Suture  removal (25 or less) []  - 0 Complex Staple / Suture removal (26 or more) []  - 0 Hypo/Hyperglycemic Management (do not check if billed separately) X- 1 15 Ankle / Brachial Index (ABI) - do not check if billed separately Has the patient been  seen at the hospital within the last three years: Yes Total Score: 110 Level Of Care: New/Established - Level 3 Electronic Signature(s) Signed: 05/16/2020 5:24:43 PM By: Deon Pilling Entered By: Deon Pilling on 05/14/2020 10:11:40 -------------------------------------------------------------------------------- Compression Therapy Details Patient Name: Date of Service: [name], [place] A. 05/14/2020 9:00 A M Medical Record Number: [id number] Patient Account Number: [id number] Date of Birth/Sex: Treating RN: [name], [name] (64 y.o. Male) Deon Pilling Primary Care Briget Shaheed: Merita Norton ULT, PRO V IDER Other Clinician: Referring Nikayla Madaris: Treating Sherle Mello/Extender: Pleas Koch, Bindubal Weeks in Treatment: 0 Compression Therapy Performed for Wound Assessment: Wound #1 Left,Lateral Lower Leg Performed By: Clinician Deon Pilling, RN Compression Type: Three Layer Post Procedure Diagnosis Same as Pre-procedure Electronic Signature(s) Signed: 05/16/2020 5:24:43 PM By: Deon Pilling Entered By: Deon Pilling on 05/14/2020 10:16:47 -------------------------------------------------------------------------------- Lower Extremity Assessment Details Patient Name: Date of Service: [name], [name] [place] A. 05/14/2020 9:00 A M Medical Record Number: [id number] Patient Account Number: [id number] Date of Birth/Sex: Treating RN: [name] (64 y.o. Male) Levan Hurst Primary Care Charl Wellen: Merita Norton ULT, PRO V IDER Other Clinician: Referring Starlee Corralejo: Treating Kacey Dysert/Extender: Pleas Koch, Bindubal Weeks in Treatment: 0 Edema Assessment Assessed: [Left: No] [Right: No] Edema: [Left: Ye] [Right: s] Calf Left: Right: Point of Measurement: 32 cm From  Medial Instep 42.3 cm Ankle Left: Right: Point of Measurement: 10 cm From Medial Instep 29.5 cm Vascular Assessment Pulses: Dorsalis Pedis Palpable: [Left:No] Doppler Audible: [Left:Yes] Blood Pressure: Brachial: [Left:190] Ankle: [Left:Dorsalis Pedis: 210 1.11] Electronic Signature(s) Signed: 05/14/2020 5:15:49 PM By: Levan Hurst RN, BSN Entered By: Levan Hurst on 05/14/2020 09:41:11 -------------------------------------------------------------------------------- Multi Wound Chart Details Patient Name: Date of Service: [name], [place] A. 05/14/2020 9:00 A M Medical Record Number: [id number] Patient Account Number: [id number] Date of Birth/Sex: Treating RN: [birth year]/[name] (64 y.o. Male) [name], [name] Primary Care Paiten Boies: Merita Norton ULT, PRO V IDER Other Clinician: Referring Milo Schreier: Treating Breton Berns/Extender: Pleas Koch, Bindubal Weeks in Treatment: 0 Vital Signs Height(in): 70 Pulse(bpm): 67 Weight(lbs): 250 Blood Pressure(mmHg): 190/61 Body Mass Index(BMI): 36 Temperature(F): 97.7 Respiratory Rate(breaths/min): 20 Photos: [1:No Photos Left, Lateral Lower Leg] [N/A:N/A N/A] Wound Location: [1:Trauma] [N/A:N/A] Wounding Event: [1:Venous Leg Ulcer] [N/A:N/A] Primary Etiology: [1:Sleep Apnea, Arrhythmia, Coronary N/A] Comorbid History: [1:Artery Disease, Hypertension, Myocardial Infarction, Peripheral Arterial Disease, Type II Diabetes, Neuropathy 05/01/2019] [N/A:N/A] Date Acquired: [1:0] [N/A:N/A] Weeks of Treatment: [1:Open] [N/A:N/A] Wound Status: [1:2.5x3.5x0.1] [N/A:N/A] Measurements L x W x D (cm) [1:6.872] [N/A:N/A] A (cm) : rea [1:0.687] [N/A:N/A] Volume (cm) : [1:Full Thickness Without Exposed] [N/A:N/A] Classification: [1:Support Structures Medium] [N/A:N/A] Exudate Amount: [1:Serosanguineous] [N/A:N/A] Exudate Type: [1:red, brown] [N/A:N/A] Exudate Color: [1:Flat and Intact] [N/A:N/A] Wound Margin: [1:Large (67-100%)] [N/A:N/A] Granulation  Amount: [1:Red] [N/A:N/A] Granulation Quality: [1:Small (1-33%)] [N/A:N/A] Necrotic Amount: [1:Fat Layer (Subcutaneous Tissue): Yes N/A] Exposed Structures: [1:Fascia: No Tendon: No Muscle: No Joint: No Bone: No None] [N/A:N/A] Epithelialization: [1:Compression Therapy] [N/A:N/A] Treatment Notes Electronic Signature(s) Signed: 05/14/2020 4:47:47 PM By: Linton Ham MD Signed: 05/16/2020 5:24:43 PM By: Deon Pilling Entered By: Linton Ham on 05/14/2020 10:29:50 -------------------------------------------------------------------------------- Multi-Disciplinary Care Plan Details Patient Name: Date of Service: [name], [place] A. 05/14/2020 9:00 A M Medical Record Number: [id number] Patient Account Number: [id number] Date of Birth/Sex: Treating RN: August [birth year], [name] (64 y.o. Male) Deon Pilling Primary Care Verniece Encarnacion: Merita Norton ULT, PRO V IDER Other Clinician: Referring Steen Bisig: Treating Adahlia Stembridge/Extender: Pleas Koch, Bindubal Weeks in Treatment: 0 Active Inactive Abuse / Safety / Falls / Self Care Management Nursing Diagnoses: Potential for falls Goals: Patient will remain injury free related to falls  Date Initiated: 05/14/2020 Target Resolution Date: 06/28/2020 Goal Status: Active Interventions: Provide education on fall prevention Notes: Nutrition Nursing Diagnoses: Impaired glucose control: actual or potential Goals: Patient/caregiver agrees to and verbalizes understanding of need to obtain nutritional consultation Date Initiated: 05/14/2020 Target Resolution Date: 05/24/2020 Goal Status: Active Patient/caregiver will maintain therapeutic glucose control Date Initiated: 05/14/2020 Target Resolution Date: 05/24/2020 Goal Status: Active Interventions: Provide education on elevated blood sugars and impact on wound healing Provide education on nutrition Treatment Activities: Obtain HgA1c : 05/14/2020 Patient referred to Primary Care Physician for further nutritional  evaluation : 05/14/2020 Notes: Orientation to the Wound Care Program Nursing Diagnoses: Knowledge deficit related to the wound healing center program Goals: Patient/caregiver will verbalize understanding of the [place] Program Date Initiated: 05/14/2020 Target Resolution Date: 05/24/2020 Goal Status: Active Interventions: Provide education on orientation to the wound center Notes: Venous Leg Ulcer Nursing Diagnoses: Potential for venous Insuffiency (use before diagnosis confirmed) Goals: Non-invasive venous studies are completed as ordered Date Initiated: 05/14/2020 Target Resolution Date: 05/31/2020 Goal Status: Active Interventions: Provide education on venous insufficiency Treatment Activities: Non-invasive vascular studies : 05/14/2020 Notes: Electronic Signature(s) Signed: 05/16/2020 5:24:43 PM By: Deon Pilling Entered By: Deon Pilling on 05/14/2020 10:10:[name] -------------------------------------------------------------------------------- Pain Assessment Details Patient Name: Date of Service: [name], [place] A. 05/14/2020 9:00 A M Medical Record Number: [id number] Patient Account Number: [id number] Date of Birth/Sex: Treating RN: [name][birth year] (64 y.o. Male) Levan Hurst Primary Care Arron Tetrault: Merita Norton ULT, PRO [address] Filter Other Clinician: Referring Juanangel Soderholm: Treating Dorcas Melito/Extender: Pleas Koch, Bindubal Weeks in Treatment: 0 Active Problems Location of Pain Severity and Description of Pain Patient Has Paino No Site Locations Pain Management and Medication Current Pain Management: Electronic Signature(s) Signed: 05/14/2020 5:15:49 PM By: Levan Hurst RN, BSN Entered By: Levan Hurst on 05/14/2020 09:42:38 -------------------------------------------------------------------------------- Patient/Caregiver Education Details Patient Name: Date of Service: [name] 10/26/2021andnbsp9:00 [place] Record Number: [id number] Patient Account  Number: [id number] Date of Birth/Gender: Treating RN: [birth year]-[name] (64 y.o. Male) Deon Pilling Primary Care Physician: Merita Norton ULT, PRO V IDER Other Clinician: Referring Physician: Treating Physician/Extender: Pleas Koch, Bindubal Weeks in Treatment: 0 Education Assessment Education Provided To: Patient Education Topics Provided Venous: Handouts: Controlling Swelling with Multilayered Compression Wraps Methods: Explain/Verbal, Printed Responses: Reinforcements needed [place]: o Handouts: Welcome T [facility] [place] o Methods: Explain/Verbal, Printed Responses: Reinforcements needed Electronic Signature(s) Signed: 05/16/2020 5:24:43 PM By: Deon Pilling Entered By: Deon Pilling on 05/14/2020 10:11:06 -------------------------------------------------------------------------------- Wound Assessment Details Patient Name: Date of Service: [name], [place] A. 05/14/2020 9:00 A M Medical Record Number: [id number] Patient Account Number: [id number] Date of Birth/Sex: Treating RN: [birth year]/[name] (64 y.o. Male) Levan Hurst Primary Care Tyffany Waldrop: Merita Norton ULT, PRO V IDER Other Clinician: Referring Obie Silos: Treating Ionna Avis/Extender: Pleas Koch, Bindubal Weeks in Treatment: 0 Wound Status Wound Number: 1 Primary Venous Leg Ulcer Etiology: Wound Location: Left, Lateral Lower Leg Wound Open Wounding Event: Trauma Status: Date Acquired: 05/01/2019 Comorbid Sleep Apnea, Arrhythmia, Coronary Artery Disease, Hypertension, Weeks Of Treatment: 0 History: Myocardial Infarction, Peripheral Arterial Disease, Type II Diabetes, Clustered Wound: No Neuropathy Photos Photo Uploaded By: Mikeal Hawthorne on 05/15/2020 13:58:03 Wound Measurements Length: (cm) 2.5 Width: (cm) 3.5 Depth: (cm) 0.1 Area: (cm) 6.872 Volume: (cm) 0.687 % Reduction in Area: % Reduction in Volume: Epithelialization: None Tunneling: No Undermining: No Wound  Description Classification: Full Thickness Without Exposed Support Structures Wound Margin: Flat and Intact Exudate Amount: Medium Exudate Type: Serosanguineous Exudate Color: red, brown  Foul Odor After Cleansing: No Slough/Fibrino Yes Wound Bed Granulation Amount: Large (67-100%) Exposed Structure Granulation Quality: Red Fascia Exposed: No Necrotic Amount: Small (1-33%) Fat Layer (Subcutaneous Tissue) Exposed: Yes Necrotic Quality: Adherent Slough Tendon Exposed: No Muscle Exposed: No Joint Exposed: No Bone Exposed: No Electronic Signature(s) Signed: 05/14/2020 5:15:49 PM By: Levan Hurst RN, BSN Entered By: Levan Hurst on 05/14/2020 09:42:26 -------------------------------------------------------------------------------- Vitals Details Patient Name: Date of Service: [name], [place] A. 05/14/2020 9:00 A M Medical Record Number: [id number] Patient Account Number: [id number] Date of Birth/Sex: Treating RN: [birth year]-[name] (64 y.o. Male) Levan Hurst Primary Care Genova Kiner: Merita Norton ULT, PRO V IDER Other Clinician: Referring Elmer Boutelle: Treating Leisa Gault/Extender: Pleas Koch, Bindubal Weeks in Treatment: 0 Vital Signs Time Taken: 09:17 Temperature (F): 97.7 Height (in): 70 Pulse (bpm): 67 Source: Stated Respiratory Rate (breaths/min): 20 Weight (lbs): 250 Blood Pressure (mmHg): 190/61 Source: Stated Reference Range: 80 - 120 mg / dl Body Mass Index (BMI): 35.9 Electronic Signature(s) Signed: 05/14/2020 5:15:49 PM By: Levan Hurst RN, BSN Entered By: Levan Hurst on 05/14/2020 09:18:27

## 2020-05-16 NOTE — Progress Notes (Signed)
KEVAUGHN, EWING ([id number]) Visit Report for 05/14/2020 Chief Complaint Document Details Patient Name: Date of Service: [name], [name] Medical Record Number: [id number] Patient Account Number: [id number] Date of Birth/Sex: Treating RN: 04/11/1956 (64 y.o. Male) Deon Pilling Primary Care Provider: Merita Norton ULT, PRO V IDER Other Clinician: Referring Provider: Treating Provider/Extender: Pleas Koch, Bindubal Weeks in Treatment: 0 Information Obtained from: Patient Chief Complaint 05/14/2020; patient is here for review of an abrasion injury on the left lateral calf Electronic Signature(s) Signed: 05/14/2020 4:47:47 PM By: Linton Ham MD Entered By: Linton Ham on 05/14/2020 10:30:09 -------------------------------------------------------------------------------- HPI Details Patient Name: Date of Service: [name], [place] RK A. 05/14/2020 9:00 A [name] Medical Record Number: [id number] Patient Account Number: [id number] Date of Birth/Sex: Treating RN: April 23, 1956 (64 y.o. Male) Deon Pilling Primary Care Provider: Merita Norton ULT, PRO V IDER Other Clinician: Referring Provider: Treating Provider/Extender: Pleas Koch, Bindubal Weeks in Treatment: 0 History of Present Illness HPI Description: ADMISSION 05/14/2020; this is a 64 year old man with multiple medical issues. Predominantly he has type 2 diabetes with a history of peripheral neuropathy and also history of fairly significant PAD. He had a left superficial femoral to posterior tibial artery bypass in February 2017 he also had an atherectomy and angioplasty by Dr. Gwenlyn Found of the right popliteal artery in 2016. He is supposed to be getting arterial studies annually however this was interrupted last year because of the pandemic. He tells [place] he was at [facility] 2 weeks ago was getting out of of the scooter and traumatized his left lateral lower leg. There was a lot of bleeding as the patient is on Plavix and Eliquis.  They have been dressing this with Neosporin and doing a fairly good job. Wound measures 2.5 x 3.5 it does not have any depth he does not have a wound history in his legs outside of surgery however he does have chronic edema and skin changes suggestive of chronic venous disease possibly some degree of lymphedema as well. Past medical history includes type 2 diabetes with peripheral neuropathy and gait instability, lumbar spondylosis, obstructive sleep apnea, history of a left pontine CVA, basal cell skin cancer, atrial fibrillation on anticoagulation, significant PAD as noted with a left superficial femoral to posterior tibial bypass in February 2017 and a right popliteal atherectomy and angioplasty by Dr. Gwenlyn Found in 30th 2016. He also has a history of coronary artery disease with an MI in 2002 hypertension hyperlipidemia and heart failure with preserved ejection fraction His last arterial studies I can see in epic were on 03/10/2018 this showed a right ABI of 0.69 and a right TBI of 0.5 with monophasic waveforms on the right. On the left his ABI was 1.20 with a TBI of 0.92 and triphasic waveforms. He has not had arterial studies since. Our nurse in the clinic got an ABI on the left of 1.1 Electronic Signature(s) Signed: 05/14/2020 4:47:47 PM By: Linton Ham MD Entered By: Linton Ham on 05/14/2020 10:37:21 -------------------------------------------------------------------------------- Physical Exam Details Patient Name: Date of Service: [name], [name] [place] A. 05/14/2020 9:00 A [name] Medical Record Number: [id number] Patient Account Number: [id number] Date of Birth/Sex: Treating RN: [birth year] (64 y.o. Male) Deon Pilling Primary Care Provider: Merita Norton ULT, PRO V IDER Other Clinician: Referring Provider: Treating Provider/Extender: Pleas Koch, Bindubal Weeks in Treatment: 0 Constitutional Patient is hypertensive.. Pulse regular and within target range for patient.[name] Respirations regular,  non-labored and within target range.. Temperature is normal and within the target range for the patient.[name]  Appears in no distress. Respiratory work of breathing is normal. Bilateral breath sounds are clear and equal in all lobes with no wheezes, rales or rhonchi.. Cardiovascular Popliteal pulses were palpable bilaterally. Pulses were palpable on the left foot at the dorsalis pedis and posterior tibial. He has 2+ edema in his lower calves bilaterally. Thick scaly skin. This has the appearance of chronic venous insufficiency with lymphedema.[name] Psychiatric appears at normal baseline. Notes Wound exam; the area in question is on the left lateral calf. Healthy looking tissue rim of epithelialization. No debridement was required there is no evidence of surrounding infection Electronic Signature(s) Signed: 05/14/2020 4:47:47 PM By: Linton Ham MD Entered By: Linton Ham on 05/14/2020 10:41:08 -------------------------------------------------------------------------------- Physician Orders Details Patient Name: Date of Service: [name], [name] 05/14/2020 9:00 A [name] Medical Record Number: [id number] Patient Account Number: [id number] Date of Birth/Sex: Treating RN: 09/21/1955 (64 y.o. Male) Deon Pilling Primary Care Provider: Merita Norton ULT, PRO V IDER Other Clinician: Referring Provider: Treating Provider/Extender: Pleas Koch, Bindubal Weeks in Treatment: 0 Verbal / Phone Orders: No Diagnosis Coding Follow-up Appointments ppointment in 1 week. - Leave compression wrap/dressing on until home health arrives or when you return. Return A Dressing Change Frequency Other: - Twice a week. Once by home health and once by wound center. Skin Barriers/Peri-Wound Care Moisturizing lotion Wound Cleansing May shower with protection. - use a cast protector. May shower and wash wound with soap and water. - may shower with dressing changes. Primary Wound Dressing Wound #1 Left,Lateral Lower  Leg Hydrofera Blue Secondary Dressing ABD pad Edema Control 3 Layer Compression System - Left Lower Extremity Avoid standing for long periods of time Elevate legs to the level of the heart or above for 30 minutes daily and/or when sitting, a frequency of: - throughout the day. Exercise regularly Additional Orders / Instructions Other: - patient needs to follow up with Vein and Vascular related to vascular status. Home Health dmit to [facility] - for wound care and dressing changes. Home Health to change weekly and wound center weekly. A Patient Medications llergies: No Known Drug Allergies A Notifications Medication Indication Start End lidocaine DOSE topical 4 % gel - gel topical applied only in clinic before MD debrides the wound. Electronic Signature(s) Signed: 05/14/2020 4:47:47 PM By: Linton Ham MD Signed: 05/16/2020 5:24:43 PM By: Deon Pilling Entered By: Deon Pilling on 05/14/2020 10:16:54 Prescription 05/14/2020 -------------------------------------------------------------------------------- Artelia Laroche MD Patient Name: Provider: 20-Sep-1955 [id number] Date of Birth: NPI#: Male [id number] Sex: DEA #: [phone] Phone #: License #: [place] Patient Address: [address] [place], [place] [address] [place],  [address] [phone] Allergies No Known Drug Allergies Medication Medication: Route: Strength: Form: lidocaine topical 4% gel Class: TOPICAL LOCAL ANESTHETICS Dose: Frequency / Time: Indication: gel topical applied only in clinic before MD debrides the wound. Number of Refills: Number of Units: 0 Generic Substitution: Start Date: End Date: Administered at Facility: Substitution Permitted Yes Time Administered: Time Discontinued: Note to Pharmacy: Hand Signature: Date(s): Electronic Signature(s) Signed: 05/14/2020  4:47:47 PM By: Linton Ham MD Signed: 05/16/2020 5:24:43 PM By: Deon Pilling Entered By: Deon Pilling on 05/14/2020 10:16:55 -------------------------------------------------------------------------------- Problem List Details Patient Name: Date of Service: [name], [name] 05/14/2020 9:00 A [name] Medical Record Number: [id number] Patient Account Number: [id number] Date of Birth/Sex: Treating RN: 12-14-1955 (64 y.o. Male) Deon Pilling Primary Care Provider: DEFA ULT, PRO V IDER  Other Clinician: Referring Provider: Treating Provider/Extender: Pleas Koch, Bindubal Weeks in Treatment: 0 Active Problems ICD-10 Encounter Code Description Active Date MDM Diagnosis S80.812D Abrasion, left lower leg, subsequent encounter 05/14/2020 No Yes L97.821 Non-pressure chronic ulcer of other part of left lower leg limited to breakdown 05/14/2020 No Yes of skin E11.51 Type 2 diabetes mellitus with diabetic peripheral angiopathy without gangrene 05/14/2020 No Yes I87.323 Chronic venous hypertension (idiopathic) with inflammation of bilateral lower 05/14/2020 No Yes extremity Inactive Problems Resolved Problems Electronic Signature(s) Signed: 05/14/2020 4:47:47 PM By: Linton Ham MD Entered By: Linton Ham on 05/14/2020 10:28:43 -------------------------------------------------------------------------------- Progress Note Details Patient Name: Date of Service: [name], [name] [place] A. 05/14/2020 9:00 A [name] Medical Record Number: [id number] Patient Account Number: [id number] Date of Birth/Sex: Treating RN: Sep 03, 1955 (64 y.o. Male) Deon Pilling Primary Care Provider: Merita Norton ULT, PRO V IDER Other Clinician: Referring Provider: Treating Provider/Extender: Pleas Koch, Bindubal Weeks in Treatment: 0 Subjective Chief Complaint Information obtained from Patient 05/14/2020; patient is here for review of an abrasion injury on the left lateral calf History of Present Illness  (HPI) ADMISSION 05/14/2020; this is a 64 year old man with multiple medical issues. Predominantly he has type 2 diabetes with a history of peripheral neuropathy and also history of fairly significant PAD. He had a left superficial femoral to posterior tibial artery bypass in February 2017 he also had an atherectomy and angioplasty by Dr. Gwenlyn Found of the right popliteal artery in 2016. He is supposed to be getting arterial studies annually however this was interrupted last year because of the pandemic. He tells [place] he was at [facility] 2 weeks ago was getting out of of the scooter and traumatized his left lateral lower leg. There was a lot of bleeding as the patient is on Plavix and Eliquis. They have been dressing this with Neosporin and doing a fairly good job. Wound measures 2.5 x 3.5 it does not have any depth he does not have a wound history in his legs outside of surgery however he does have chronic edema and skin changes suggestive of chronic venous disease possibly some degree of lymphedema as well. Past medical history includes type 2 diabetes with peripheral neuropathy and gait instability, lumbar spondylosis, obstructive sleep apnea, history of a left pontine CVA, basal cell skin cancer, atrial fibrillation on anticoagulation, significant PAD as noted with a left superficial femoral to posterior tibial bypass in February 2017 and a right popliteal atherectomy and angioplasty by Dr. Gwenlyn Found in 30th 2016. He also has a history of coronary artery disease with an MI in 2002 hypertension hyperlipidemia and heart failure with preserved ejection fraction His last arterial studies I can see in epic were on 03/10/2018 this showed a right ABI of 0.69 and a right TBI of 0.5 with monophasic waveforms on the right. On the left his ABI was 1.20 with a TBI of 0.92 and triphasic waveforms. He has not had arterial studies since. Our nurse in the clinic got an ABI on the left of 1.1 Patient History Information  obtained from Patient. Allergies No Known Drug Allergies Family History Unknown History. Social History Never smoker, Marital Status - Single, Alcohol Use - Never, Drug Use - No History, Caffeine Use - Rarely. Medical History Respiratory Patient has history of Sleep Apnea Cardiovascular Patient has history of Arrhythmia - A-Fib, Coronary Artery Disease - s/p CABG, Hypertension, Myocardial Infarction, Peripheral Arterial Disease - s/p Fempop x2 Endocrine Patient has history of Type II Diabetes Neurologic Patient has history of Neuropathy Patient is  treated with Insulin, Oral Agents. Blood sugar is tested. Medical A Surgical History Notes nd Cardiovascular CVA x 3 Review of Systems (ROS) Constitutional Symptoms (General Health) Denies complaints or symptoms of Fatigue, Fever, Chills, Marked Weight Change. Eyes Denies complaints or symptoms of Dry Eyes, Vision Changes, Glasses / Contacts. Ear/Nose/Mouth/Throat Denies complaints or symptoms of Chronic sinus problems or rhinitis. Gastrointestinal Denies complaints or symptoms of Frequent diarrhea, Nausea, Vomiting. Genitourinary Denies complaints or symptoms of Frequent urination. Integumentary (Skin) Complains or has symptoms of Wounds - wound on left lower leg. Musculoskeletal Denies complaints or symptoms of Muscle Pain, Muscle Weakness. Psychiatric Denies complaints or symptoms of Claustrophobia, Suicidal. Objective Constitutional Patient is hypertensive.. Pulse regular and within target range for patient.[name] Respirations regular, non-labored and within target range.. Temperature is normal and within the target range for the patient.[name] Appears in no distress. Vitals Time Taken: 9:17 AM, Height: 70 in, Source: Stated, Weight: 250 lbs, Source: Stated, BMI: 35.9, Temperature: 97.7 F, Pulse: 67 bpm, Respiratory Rate: 20 breaths/min, Blood Pressure: 190/61 mmHg. Respiratory work of breathing is normal. Bilateral breath sounds  are clear and equal in all lobes with no wheezes, rales or rhonchi.. Cardiovascular Popliteal pulses were palpable bilaterally. Pulses were palpable on the left foot at the dorsalis pedis and posterior tibial. He has 2+ edema in his lower calves bilaterally. Thick scaly skin. This has the appearance of chronic venous insufficiency with lymphedema.[name] Psychiatric appears at normal baseline. General Notes: Wound exam; the area in question is on the left lateral calf. Healthy looking tissue rim of epithelialization. No debridement was required there is no evidence of surrounding infection Integumentary (Hair, Skin) Wound #1 status is Open. Original cause of wound was Trauma. The wound is located on the Left,Lateral Lower Leg. The wound measures 2.5cm length x 3.5cm width x 0.1cm depth; 6.872cm^2 area and 0.687cm^3 volume. There is Fat Layer (Subcutaneous Tissue) exposed. There is no tunneling or undermining noted. There is a medium amount of serosanguineous drainage noted. The wound margin is flat and intact. There is large (67-100%) red granulation within the wound bed. There is a small (1-33%) amount of necrotic tissue within the wound bed including Adherent Slough. Assessment Active Problems ICD-10 Abrasion, left lower leg, subsequent encounter Non-pressure chronic ulcer of other part of left lower leg limited to breakdown of skin Type 2 diabetes mellitus with diabetic peripheral angiopathy without gangrene Chronic venous hypertension (idiopathic) with inflammation of bilateral lower extremity Procedures Wound #1 Pre-procedure diagnosis of Wound #1 is a Venous Leg Ulcer located on the Left,Lateral Lower Leg . There was a Three Layer Compression Therapy Procedure by Deon Pilling, RN. Post procedure Diagnosis Wound #1: Same as Pre-Procedure Plan Follow-up Appointments: Return Appointment in 1 week. - Leave compression wrap/dressing on until home health arrives or when you return. Dressing  Change Frequency: Other: - Twice a week. Once by home health and once by wound center. Skin Barriers/Peri-Wound Care: Moisturizing lotion Wound Cleansing: May shower with protection. - use a cast protector. May shower and wash wound with soap and water. - may shower with dressing changes. Primary Wound Dressing: Wound #1 Left,Lateral Lower Leg: Hydrofera Blue Secondary Dressing: ABD pad Edema Control: 3 Layer Compression System - Left Lower Extremity Avoid standing for long periods of time Elevate legs to the level of the heart or above for 30 minutes daily and/or when sitting, a frequency of: - throughout the day. Exercise regularly Additional Orders / Instructions: Other: - patient needs to follow up with Vein and  Vascular related to vascular status. Home Health: Admit to [facility] - for wound care and dressing changes. Home Health to change weekly and wound center weekly. The following medication(s) was prescribed: lidocaine topical 4 % gel gel topical applied only in clinic before MD debrides the wound. was prescribed at facility 1. Traumatic wound in the left lateral calf as described this is superficial and does not look infected. He has surrounding edema which I think is chronic venous perhaps some degree of lymphedema as well. 2. In spite of the significant PAD his pulses in in the left foot as well as the left popliteal were really easily palpable. Together with the ABI on the left leg of 1.01 I felt comfortable putting the leg in 3 layer compression to see if we can mobilize edema away from the wound surface 3. We will use Hydrofera Blue/ABDs under 3 layer compression which he seemed to tolerate quite well 4. With regards to his known PAD with the left femoral to posterior tibial artery bypass and a previous atherectomy and popliteal angioplasty on the right I have advised him to make an appointment for noninvasive arterial studies bilaterally at vein and  vascular. We did not make these today ourselves. I think the perfusion to his left foot is still quite robust. 5. We are going to try to get home health for this patient to change the dressing once if not he can stay on all week. I spent 35 minutes in review of this patient's past medical history, face-to-face evaluation and preparation of this record Electronic Signature(s) Signed: 05/14/2020 4:47:47 PM By: Linton Ham MD Entered By: Linton Ham on 05/14/2020 10:44:06 -------------------------------------------------------------------------------- HxROS Details Patient Name: Date of Service: [name], [name] [place] A. 05/14/2020 9:00 A [name] Medical Record Number: [id number] Patient Account Number: [id number] Date of Birth/Sex: Treating RN: 22-Apr-1956 (64 y.o. Male) Levan Hurst Primary Care Provider: Merita Norton ULT, PRO V IDER Other Clinician: Referring Provider: Treating Provider/Extender: Pleas Koch, Bindubal Weeks in Treatment: 0 Information Obtained From Patient Constitutional Symptoms (General Health) Complaints and Symptoms: Negative for: Fatigue; Fever; Chills; Marked Weight Change Eyes Complaints and Symptoms: Negative for: Dry Eyes; Vision Changes; Glasses / Contacts Ear/Nose/Mouth/Throat Complaints and Symptoms: Negative for: Chronic sinus problems or rhinitis Gastrointestinal Complaints and Symptoms: Negative for: Frequent diarrhea; Nausea; Vomiting Genitourinary Complaints and Symptoms: Negative for: Frequent urination Integumentary (Skin) Complaints and Symptoms: Positive for: Wounds - wound on left lower leg Musculoskeletal Complaints and Symptoms: Negative for: Muscle Pain; Muscle Weakness Psychiatric Complaints and Symptoms: Negative for: Claustrophobia; Suicidal Hematologic/Lymphatic Respiratory Medical History: Positive for: Sleep Apnea Cardiovascular Medical History: Positive for: Arrhythmia - A-Fib; Coronary Artery Disease - s/p CABG;  Hypertension; Myocardial Infarction; Peripheral Arterial Disease - s/p Fempop x 2 Past Medical History Notes: CVA x 3 Endocrine Medical History: Positive for: Type II Diabetes Time with diabetes: since 1997 Treated with: Insulin, Oral agents Blood sugar tested every day: Yes Tested : 2-3x per day Immunological Neurologic Medical History: Positive for: Neuropathy Oncologic Immunizations Pneumococcal Vaccine: Received Pneumococcal Vaccination: Yes Implantable Devices None Family and Social History Unknown History: Yes; Never smoker; Marital Status - Single; Alcohol Use: Never; Drug Use: No History; Caffeine Use: Rarely; Financial Concerns: No; Food, Clothing or Shelter Needs: No; Support System Lacking: No; Transportation Concerns: No Engineer, maintenance) Signed: 05/14/2020 4:47:47 PM By: Linton Ham MD Signed: 05/14/2020 5:15:49 PM By: Levan Hurst RN, BSN Entered By: Levan Hurst on 05/14/2020 09:33:14 -------------------------------------------------------------------------------- SuperBill Details Patient Name: Date of Service: [place],  [name] 05/14/2020 Medical Record Number: [id number] Patient Account Number: [id number] Date of Birth/Sex: Treating RN: 12/13/1955 (64 y.o. Male) Deon Pilling Primary Care Provider: Merita Norton ULT, PRO V IDER Other Clinician: Referring Provider: Treating Provider/Extender: Pleas Koch, Bindubal Weeks in Treatment: 0 Diagnosis Coding ICD-10 Codes Code Description S80.812D Abrasion, left lower leg, subsequent encounter L97.821 Non-pressure chronic ulcer of other part of left lower leg limited to breakdown of skin E11.51 Type 2 diabetes mellitus with diabetic peripheral angiopathy without gangrene I87.323 Chronic venous hypertension (idiopathic) with inflammation of bilateral lower extremity Facility Procedures CPT4 Code: [id number] Description: [place] VISIT-LEV 3 EST PT Modifier: Quantity: 1 CPT4 Code:  [id number] Description: (Facility Use Only) [id number] - [place] LWR LT LEG Modifier: Quantity: 1 Physician Procedures : CPT4 Code Description Modifier [id number] [place] PHYS LEVEL 3 [name] PT ICD-10 Diagnosis Description S80.812D Abrasion, left lower leg, subsequent encounter L97.821 Non-pressure chronic ulcer of other part of left lower leg limited to breakdown of skin E11.51  Type 2 diabetes mellitus with diabetic peripheral angiopathy without gangrene I87.323 Chronic venous hypertension (idiopathic) with inflammation of bilateral lower extremity Quantity: 1 Electronic Signature(s) Signed: 05/14/2020 4:47:47 PM By: Linton Ham MD Signed: 05/16/2020 5:24:43 PM By: Deon Pilling Entered By: Deon Pilling on 05/14/2020 12:04:14

## 2020-05-18 DIAGNOSIS — I87312 Chronic venous hypertension (idiopathic) with ulcer of left lower extremity: Secondary | ICD-10-CM | POA: Diagnosis not present

## 2020-05-18 DIAGNOSIS — S80812D Abrasion, left lower leg, subsequent encounter: Secondary | ICD-10-CM | POA: Diagnosis not present

## 2020-05-18 DIAGNOSIS — E1151 Type 2 diabetes mellitus with diabetic peripheral angiopathy without gangrene: Secondary | ICD-10-CM | POA: Diagnosis not present

## 2020-05-18 DIAGNOSIS — I4891 Unspecified atrial fibrillation: Secondary | ICD-10-CM | POA: Diagnosis not present

## 2020-05-18 DIAGNOSIS — I87323 Chronic venous hypertension (idiopathic) with inflammation of bilateral lower extremity: Secondary | ICD-10-CM | POA: Diagnosis not present

## 2020-05-18 DIAGNOSIS — E1142 Type 2 diabetes mellitus with diabetic polyneuropathy: Secondary | ICD-10-CM | POA: Diagnosis not present

## 2020-05-18 DIAGNOSIS — L97821 Non-pressure chronic ulcer of other part of left lower leg limited to breakdown of skin: Secondary | ICD-10-CM | POA: Diagnosis not present

## 2020-05-18 DIAGNOSIS — I87321 Chronic venous hypertension (idiopathic) with inflammation of right lower extremity: Secondary | ICD-10-CM | POA: Diagnosis not present

## 2020-05-18 DIAGNOSIS — I10 Essential (primary) hypertension: Secondary | ICD-10-CM | POA: Diagnosis not present

## 2020-05-18 DIAGNOSIS — I2510 Atherosclerotic heart disease of native coronary artery without angina pectoris: Secondary | ICD-10-CM | POA: Diagnosis not present

## 2020-05-21 ENCOUNTER — Other Ambulatory Visit: Payer: Self-pay

## 2020-05-21 ENCOUNTER — Encounter (HOSPITAL_BASED_OUTPATIENT_CLINIC_OR_DEPARTMENT_OTHER): Payer: Medicare HMO | Attending: Internal Medicine | Admitting: Internal Medicine

## 2020-05-21 DIAGNOSIS — E1151 Type 2 diabetes mellitus with diabetic peripheral angiopathy without gangrene: Secondary | ICD-10-CM | POA: Diagnosis not present

## 2020-05-21 DIAGNOSIS — Z8673 Personal history of transient ischemic attack (TIA), and cerebral infarction without residual deficits: Secondary | ICD-10-CM | POA: Insufficient documentation

## 2020-05-21 DIAGNOSIS — I872 Venous insufficiency (chronic) (peripheral): Secondary | ICD-10-CM | POA: Insufficient documentation

## 2020-05-21 DIAGNOSIS — I110 Hypertensive heart disease with heart failure: Secondary | ICD-10-CM | POA: Diagnosis not present

## 2020-05-21 DIAGNOSIS — E11622 Type 2 diabetes mellitus with other skin ulcer: Secondary | ICD-10-CM | POA: Insufficient documentation

## 2020-05-21 DIAGNOSIS — I5032 Chronic diastolic (congestive) heart failure: Secondary | ICD-10-CM | POA: Diagnosis not present

## 2020-05-21 DIAGNOSIS — E785 Hyperlipidemia, unspecified: Secondary | ICD-10-CM | POA: Diagnosis not present

## 2020-05-21 DIAGNOSIS — I4891 Unspecified atrial fibrillation: Secondary | ICD-10-CM | POA: Diagnosis not present

## 2020-05-21 DIAGNOSIS — E1142 Type 2 diabetes mellitus with diabetic polyneuropathy: Secondary | ICD-10-CM | POA: Insufficient documentation

## 2020-05-21 DIAGNOSIS — I87322 Chronic venous hypertension (idiopathic) with inflammation of left lower extremity: Secondary | ICD-10-CM | POA: Diagnosis not present

## 2020-05-21 DIAGNOSIS — L97821 Non-pressure chronic ulcer of other part of left lower leg limited to breakdown of skin: Secondary | ICD-10-CM | POA: Insufficient documentation

## 2020-05-21 DIAGNOSIS — I2510 Atherosclerotic heart disease of native coronary artery without angina pectoris: Secondary | ICD-10-CM | POA: Diagnosis not present

## 2020-05-21 DIAGNOSIS — Z7901 Long term (current) use of anticoagulants: Secondary | ICD-10-CM | POA: Insufficient documentation

## 2020-05-21 DIAGNOSIS — G4733 Obstructive sleep apnea (adult) (pediatric): Secondary | ICD-10-CM | POA: Insufficient documentation

## 2020-05-21 DIAGNOSIS — Z7902 Long term (current) use of antithrombotics/antiplatelets: Secondary | ICD-10-CM | POA: Diagnosis not present

## 2020-05-21 DIAGNOSIS — I252 Old myocardial infarction: Secondary | ICD-10-CM | POA: Diagnosis not present

## 2020-05-22 NOTE — Progress Notes (Signed)
BERL, BONFANTI ([id number]) Visit Report for 05/21/2020 HPI Details Patient Name: Date of Service: SPERL, [place] [name]. 05/21/2020 10:45 A M Medical Record Number: [id number] Patient Account Number: [id number] Date of Birth/Sex: Treating RN: Mar 16, 1956 (63 y.o. [name]) [name] Primary Care Provider: Merita [name] ULT, PRO V IDER Other Clinician: Referring Provider: Treating Provider/Extender: [name], [name] in Treatment: 1 History of Present Illness HPI Description: ADMISSION 05/14/2020; this is a 64 year old man with multiple medical issues. Predominantly he has type 2 diabetes with a history of peripheral neuropathy and also history of fairly significant PAD. He had a left superficial femoral to posterior tibial artery bypass in February 2017 he also had an atherectomy and angioplasty by Dr. Gwenlyn [name] of the right popliteal artery in 2016. He is supposed to be getting arterial studies annually however this was interrupted last year because of the pandemic. He tells [place] he was at [facility] 2 [name] ago was getting out of of the scooter and traumatized his left lateral lower leg. There was a lot of bleeding as the patient is on Plavix and Eliquis. They have been dressing this with Neosporin and doing a fairly good job. Wound measures 2.5 x 3.5 it does not have any depth he does not have a wound history in his legs outside of surgery however he does have chronic edema and skin changes suggestive of chronic venous disease possibly some degree of lymphedema as well. Past medical history includes type 2 diabetes with peripheral neuropathy and gait instability, lumbar spondylosis, obstructive sleep apnea, history of a left pontine CVA, basal cell skin cancer, atrial fibrillation on anticoagulation, significant PAD as noted with a left superficial femoral to posterior tibial bypass in February 2017 and a right popliteal atherectomy and angioplasty by Dr. Gwenlyn [name] in 30th 2016. He also has a history of  coronary artery disease with an MI in 2002 hypertension hyperlipidemia and heart failure with preserved ejection fraction His last arterial studies I can see in epic were on 03/10/2018 this showed a right ABI of 0.69 and a right TBI of 0.5 with monophasic waveforms on the right. On the left his ABI was 1.20 with a TBI of 0.92 and triphasic waveforms. He has not had arterial studies since. Our nurse in the clinic got an ABI on the left of 1.1 11/2; left anterior leg wound in the setting of chronic venous insufficiency. Wound was initially trauma. We have been using Hydrofera Blue under compression he has home health.. The wound looks a lot better today with improvement in surface area Electronic Signature(s) Signed: 05/21/2020 4:57:00 PM By: [name] Ham [name] Entered By: [name] on 05/21/2020 13:11:05 -------------------------------------------------------------------------------- Physical Exam Details Patient Name: Date of Service: [name], [place] A. 05/21/2020 10:45 A M Medical Record Number: [id number] Patient Account Number: [id number] Date of Birth/Sex: Treating RN: 26-Dec-1955 (63 y.o. [name]) [name] Primary Care Provider: Merita [name] ULT, PRO V IDER Other Clinician: Referring Provider: Treating Provider/Extender: [name], [name] in Treatment: 1 Constitutional Patient is hypertensive.. Pulse regular and within target range for patient.[name] Respirations regular, non-labored and within target range.. Temperature is normal and within the target range for the patient.[name] Appears in no distress. Cardiovascular Pedal pulses are palpable. He has changes of chronic venous insufficiency hemosiderin deposition bilaterally actually right greater than left. Notes Wound exam; the area in question is on the left lateral calf. Healthy looking wound good granulation. No surrounding infection. We have good edema control. Electronic Signature(s) Signed: 05/21/2020 4:57:00 PM By: [name],  [name] Signed: 05/21/2020 4:57:00 PM By: [name] Ham [name] Entered By: [name] on 05/21/2020 13:13:29 -------------------------------------------------------------------------------- Physician Orders Details Patient Name: Date of Service: [name], [place] A. 05/21/2020 10:45 A M Medical Record Number: [id number] Patient Account Number: [id number] Date of Birth/Sex: Treating RN: August 11, 1955 (63 y.o. M) [name] Primary Care Provider: Merita [name] ULT, PRO V IDER Other Clinician: Referring Provider: Treating Provider/Extender: [name], [name] in Treatment: 1 Verbal / Phone Orders: No Diagnosis Coding ICD-10 Coding Code Description S80.812D Abrasion, left lower leg, subsequent encounter L97.821 Non-pressure chronic ulcer of other part of left lower leg limited to breakdown of skin E11.51 Type 2 diabetes mellitus with diabetic peripheral angiopathy without gangrene I87.323 Chronic venous hypertension (idiopathic) with inflammation of bilateral lower extremity Follow-up Appointments Return Appointment in 2 [name]. Dressing Change Frequency Other: - Twice a week. Skin Barriers/Peri-Wound Care Moisturizing lotion Wound Cleansing May shower with protection. - use a cast protector. May shower and wash wound with soap and water. - may shower with dressing changes. Primary Wound Dressing Wound #1 Left,Lateral Lower Leg Hydrofera Blue Secondary Dressing ABD pad Edema Control 3 Layer Compression System - Left Lower Extremity Avoid standing for long periods of time Elevate legs to the level of the heart or above for 30 minutes daily and/or when sitting, a frequency of: - throughout the day. Exercise regularly Additional Orders / Instructions Other: - patient needs to follow up with Vein and Vascular related to vascular status. [place] skilled nursing for wound care. - for wound care and dressing changes. WELL CARE Electronic  Signature(s) Signed: 05/21/2020 4:57:00 PM By: [name] Ham [name] Signed: 05/22/2020 9:08:43 AM By: [name] Coria RN Entered By: [name] on 05/21/2020 11:49:41 -------------------------------------------------------------------------------- Problem List Details Patient Name: Date of Service: [name], [place] A. 05/21/2020 10:45 A M Medical Record Number: [id number] Patient Account Number: [id number] Date of Birth/Sex: Treating RN: 02/19/1956 (63 y.o. [name]) [name] Primary Care Provider: Merita [name] ULT, PRO V IDER Other Clinician: Referring Provider: Treating Provider/Extender: [name], [name] in Treatment: 1 Active Problems ICD-10 Encounter Code Description Active Date MDM Diagnosis S80.812D Abrasion, left lower leg, subsequent encounter 05/14/2020 No Yes L97.821 Non-pressure chronic ulcer of other part of left lower leg limited to breakdown 05/14/2020 No Yes of skin E11.51 Type 2 diabetes mellitus with diabetic peripheral angiopathy without gangrene 05/14/2020 No Yes I87.323 Chronic venous hypertension (idiopathic) with inflammation of bilateral lower 05/14/2020 No Yes extremity Inactive Problems Resolved Problems Electronic Signature(s) Signed: 05/21/2020 4:57:00 PM By: [name] Ham [name] Entered By: [name] on 05/21/2020 13:08:48 -------------------------------------------------------------------------------- Progress Note Details Patient Name: Date of Service: [name], [place] A. 05/21/2020 10:45 A M Medical Record Number: [id number] Patient Account Number: [id number] Date of Birth/Sex: Treating RN: [birth year] (63 y.o. [name]) [name] Primary Care Provider: Merita [name] ULT, PRO V IDER Other Clinician: Referring Provider: Treating Provider/Extender: [name], [name] in Treatment: 1 Subjective History of Present Illness (HPI) ADMISSION 05/14/2020; this is a 64 year old man with multiple medical issues. Predominantly he has type 2 diabetes with  a history of peripheral neuropathy and also history of fairly significant PAD. He had a left superficial femoral to posterior tibial artery bypass in February 2017 he also had an atherectomy and angioplasty by Dr. Gwenlyn [name] of the right popliteal artery in 2016. He is supposed to be getting arterial studies annually however this was interrupted last year because of the pandemic. He tells [place] he was at [facility] 2 [name] ago was  getting out of of the scooter and traumatized his left lateral lower leg. There was a lot of bleeding as the patient is on Plavix and Eliquis. They have been dressing this with Neosporin and doing a fairly good job. Wound measures 2.5 x 3.5 it does not have any depth he does not have a wound history in his legs outside of surgery however he does have chronic edema and skin changes suggestive of chronic venous disease possibly some degree of lymphedema as well. Past medical history includes type 2 diabetes with peripheral neuropathy and gait instability, lumbar spondylosis, obstructive sleep apnea, history of a left pontine CVA, basal cell skin cancer, atrial fibrillation on anticoagulation, significant PAD as noted with a left superficial femoral to posterior tibial bypass in February 2017 and a right popliteal atherectomy and angioplasty by Dr. Gwenlyn [name] in 30th 2016. He also has a history of coronary artery disease with an MI in 2002 hypertension hyperlipidemia and heart failure with preserved ejection fraction His last arterial studies I can see in epic were on 03/10/2018 this showed a right ABI of 0.69 and a right TBI of 0.5 with monophasic waveforms on the right. On the left his ABI was 1.20 with a TBI of 0.92 and triphasic waveforms. He has not had arterial studies since. Our nurse in the clinic got an ABI on the left of 1.1 11/2; left anterior leg wound in the setting of chronic venous insufficiency. Wound was initially trauma. We have been using Hydrofera Blue under compression he  has home health.. The wound looks a lot better today with improvement in surface area Objective Constitutional Patient is hypertensive.. Pulse regular and within target range for patient.[name] Respirations regular, non-labored and within target range.. Temperature is normal and within the target range for the patient.[name] Appears in no distress. Vitals Time Taken: 11:03 AM, Height: 70 in, Weight: 250 lbs, BMI: 35.9, Temperature: 97.9 F, Pulse: 71 bpm, Respiratory Rate: 20 breaths/min, Blood Pressure: 179/64 mmHg. Cardiovascular Pedal pulses are palpable. He has changes of chronic venous insufficiency hemosiderin deposition bilaterally actually right greater than left. General Notes: Wound exam; the area in question is on the left lateral calf. Healthy looking wound good granulation. No surrounding infection. We have good edema control. Integumentary (Hair, Skin) Wound #1 status is Open. Original cause of wound was Trauma. The wound is located on the Left,Lateral Lower Leg. The wound measures 1.4cm length x 1.4cm width x 0.1cm depth; 1.539cm^2 area and 0.154cm^3 volume. There is Fat Layer (Subcutaneous Tissue) exposed. There is no tunneling or undermining noted. There is a medium amount of serosanguineous drainage noted. The wound margin is flat and intact. There is large (67-100%) red granulation within the wound bed. There is no necrotic tissue within the wound bed. Assessment Active Problems ICD-10 Abrasion, left lower leg, subsequent encounter Non-pressure chronic ulcer of other part of left lower leg limited to breakdown of skin Type 2 diabetes mellitus with diabetic peripheral angiopathy without gangrene Chronic venous hypertension (idiopathic) with inflammation of bilateral lower extremity Procedures Wound #1 Pre-procedure diagnosis of Wound #1 is a Venous Leg Ulcer located on the Left,Lateral Lower Leg . There was a Three Layer Compression Therapy Procedure by [name] Coria, RN. Post  procedure Diagnosis Wound #1: Same as Pre-Procedure Plan Follow-up Appointments: Return Appointment in 2 [name]. Dressing Change Frequency: Other: - Twice a week. Skin Barriers/Peri-Wound Care: Moisturizing lotion Wound Cleansing: May shower with protection. - use a cast protector. May shower and wash wound with soap and water. -  may shower with dressing changes. Primary Wound Dressing: Wound #1 Left,Lateral Lower Leg: Hydrofera Blue Secondary Dressing: ABD pad Edema Control: 3 Layer Compression System - Left Lower Extremity Avoid standing for long periods of time Elevate legs to the level of the heart or above for 30 minutes daily and/or when sitting, a frequency of: - throughout the day. Exercise regularly Additional Orders / Instructions: Other: - patient needs to follow up with Vein and Vascular related to vascular status. Home Health: [place] skilled nursing for wound care. - for wound care and dressing changes. WELL CARE 1. This patient continues to make nice progress. 2 Hydrofera Blue to the anterior leg wound ABD under 3 layer compression to the left lower extremity 3. I took some time today to start the discussion about compression stockings. I think he has heard parts of this discussion before although he does not have a history of nonhealing wounds. Electronic Signature(s) Signed: 05/21/2020 4:57:00 PM By: [name] Ham [name] Entered By: [name] on 05/21/2020 13:14:35 -------------------------------------------------------------------------------- SuperBill Details Patient Name: Date of Service: [name], [place] A. 05/21/2020 Medical Record Number: [id number] Patient Account Number: [id number] Date of Birth/Sex: Treating RN: 10/20/1955 (63 y.o. [name]) [name] Primary Care Provider: Merita [name] ULT, PRO V IDER Other Clinician: Referring Provider: Treating Provider/Extender: [name], [name] in Treatment: 1 Diagnosis Coding ICD-10  Codes Code Description S80.812D Abrasion, left lower leg, subsequent encounter L97.821 Non-pressure chronic ulcer of other part of left lower leg limited to breakdown of skin E11.51 Type 2 diabetes mellitus with diabetic peripheral angiopathy without gangrene I87.323 Chronic venous hypertension (idiopathic) with inflammation of bilateral lower extremity Facility Procedures CPT4 Code: 01007121 Description: (Facility Use Only) [phone] - [facility] LT LEG Modifier: Quantity: 1 Physician Procedures : CPT4 Code Description Modifier [id number] - WC PHYS LEVEL 3 - EST PT ICD-10 Diagnosis Description L97.821 Non-pressure chronic ulcer of other part of left lower leg limited to breakdown of skin S80.812D Abrasion, left lower leg, subsequent encounter  I87.323 Chronic venous hypertension (idiopathic) with inflammation of bilateral lower extremity E11.51 Type 2 diabetes mellitus with diabetic peripheral angiopathy without gangrene Quantity: 1 Electronic Signature(s) Signed: 05/21/2020 4:57:00 PM By: [name] Ham [name] Entered By: [name] on 05/21/2020 13:14:58

## 2020-05-23 DIAGNOSIS — I87323 Chronic venous hypertension (idiopathic) with inflammation of bilateral lower extremity: Secondary | ICD-10-CM | POA: Diagnosis not present

## 2020-05-23 DIAGNOSIS — I10 Essential (primary) hypertension: Secondary | ICD-10-CM | POA: Diagnosis not present

## 2020-05-23 DIAGNOSIS — I87312 Chronic venous hypertension (idiopathic) with ulcer of left lower extremity: Secondary | ICD-10-CM | POA: Diagnosis not present

## 2020-05-23 DIAGNOSIS — I4891 Unspecified atrial fibrillation: Secondary | ICD-10-CM | POA: Diagnosis not present

## 2020-05-23 DIAGNOSIS — L97821 Non-pressure chronic ulcer of other part of left lower leg limited to breakdown of skin: Secondary | ICD-10-CM | POA: Diagnosis not present

## 2020-05-23 DIAGNOSIS — I2510 Atherosclerotic heart disease of native coronary artery without angina pectoris: Secondary | ICD-10-CM | POA: Diagnosis not present

## 2020-05-23 DIAGNOSIS — E1151 Type 2 diabetes mellitus with diabetic peripheral angiopathy without gangrene: Secondary | ICD-10-CM | POA: Diagnosis not present

## 2020-05-23 DIAGNOSIS — E1142 Type 2 diabetes mellitus with diabetic polyneuropathy: Secondary | ICD-10-CM | POA: Diagnosis not present

## 2020-05-23 DIAGNOSIS — I87321 Chronic venous hypertension (idiopathic) with inflammation of right lower extremity: Secondary | ICD-10-CM | POA: Diagnosis not present

## 2020-05-23 DIAGNOSIS — S80812D Abrasion, left lower leg, subsequent encounter: Secondary | ICD-10-CM | POA: Diagnosis not present

## 2020-05-23 NOTE — Progress Notes (Signed)
WHITTEN, ANDREONI ([id number]) Visit Report for 05/21/2020 Arrival Information Details Patient Name: Date of Service: ADAMCZAK, [place] A. 05/21/2020 10:45 A M Medical Record Number: [id number] Patient Account Number: [id number] Date of Birth/Sex: Treating RN: 02-May-1956 (63 y.o. Jerilynn Mages) Carlene Coria Primary Care Cydney Alvarenga: Merita Norton ULT, PRO V IDER Other Clinician: Referring Kule Gascoigne: Treating Tildon Silveria/Extender: Pleas Koch, Bindubal Weeks in Treatment: 1 Visit Information History Since Last Visit Added or deleted any medications: No Patient Arrived: Ambulatory Any new allergies or adverse reactions: No Arrival Time: 11:03 Had a fall or experienced change in No Accompanied By: wife activities of daily living that may affect Transfer Assistance: None risk of falls: Patient Identification Verified: Yes Signs or symptoms of abuse/neglect since last visito No Secondary Verification Process Completed: Yes Hospitalized since last visit: No Patient Requires Transmission-Based Precautions: No Implantable device outside of the clinic excluding No Patient Has Alerts: Yes cellular tissue based products placed in the center Patient Alerts: Patient on Blood Thinner since last visit: Has Dressing in Place as Prescribed: Yes Pain Present Now: No Electronic Signature(s) Signed: 05/21/2020 4:13:14 PM By: Sandre Kitty Entered By: Sandre Kitty on 05/21/2020 11:03:39 -------------------------------------------------------------------------------- Compression Therapy Details Patient Name: Date of Service: Isabel Caprice, [place] A. 05/21/2020 10:45 A M Medical Record Number: [id number] Patient Account Number: [id number] Date of Birth/Sex: Treating RN: [birth year] (63 y.o. Jerilynn Mages) Carlene Coria Primary Care Mckinnley Cottier: Merita Norton ULT, PRO V IDER Other Clinician: Referring Mayelin Panos: Treating Darel Ricketts/Extender: Pleas Koch, Bindubal Weeks in Treatment: 1 Compression Therapy Performed for Wound Assessment: Wound #1  Left,Lateral Lower Leg Performed By: Clinician Carlene Coria, RN Compression Type: Three Layer Post Procedure Diagnosis Same as Pre-procedure Electronic Signature(s) Signed: 05/22/2020 9:08:43 AM By: Carlene Coria RN Entered By: Carlene Coria on 05/21/2020 11:45:31 -------------------------------------------------------------------------------- Encounter Discharge Information Details Patient Name: Date of Service: Isabel Caprice, [place] A. 05/21/2020 10:45 A M Medical Record Number: [id number] Patient Account Number: [id number] Date of Birth/Sex: Treating RN: 1956-02-09 (64 y.o. [name] Primary Care Natahlia Hoggard: Merita Norton ULT, PRO V IDER Other Clinician: Referring Noreta Kue: Treating Veryl Winemiller/Extender: Pleas Koch, Bindubal Weeks in Treatment: 1 Encounter Discharge Information Items Discharge Condition: Stable Ambulatory Status: Ambulatory Discharge Destination: Home Transportation: Private Auto Accompanied By: spouse Schedule Follow-up Appointment: Yes Clinical Summary of Care: Patient Declined Electronic Signature(s) Signed: 05/23/2020 5:54:11 PM By: Levan Hurst RN, BSN Entered By: Levan Hurst on 05/21/2020 13:59:17 -------------------------------------------------------------------------------- Lower Extremity Assessment Details Patient Name: Date of Service: Isabel Caprice, [place] A. 05/21/2020 10:45 A M Medical Record Number: [id number] Patient Account Number: [id number] Date of Birth/Sex: Treating RN: September 03, 1955 (64 y.o. [name] Primary Care Aaryan Essman: Merita Norton ULT, PRO V IDER Other Clinician: Referring Makya Phillis: Treating Janaria Mccammon/Extender: Pleas Koch, Bindubal Weeks in Treatment: 1 Edema Assessment Assessed: [Left: No] [Right: No] Edema: [Left: Ye] [Right: s] Calf Left: Right: Point of Measurement: 32 cm From Medial Instep 40 cm Ankle Left: Right: Point of Measurement: 10 cm From Medial Instep 24.5 cm Vascular Assessment Pulses: Dorsalis  Pedis Palpable: [Left:Yes] Electronic Signature(s) Signed: 05/23/2020 5:54:11 PM By: Levan Hurst RN, BSN Entered By: Levan Hurst on 05/21/2020 11:21:10 -------------------------------------------------------------------------------- Multi Wound Chart Details Patient Name: Date of Service: Isabel Caprice, [place] A. 05/21/2020 10:45 A M Medical Record Number: [id number] Patient Account Number: [id number] Date of Birth/Sex: Treating RN: 01-07-1956 (63 y.o. Jerilynn Mages) Carlene Coria Primary Care Temeka Pore: Merita Norton ULT, PRO V IDER Other Clinician: Referring Shemeca Lukasik: Treating Danean Marner/Extender: Pleas Koch, Bindubal Weeks in Treatment: 1 Vital Signs Height(in): 70 Pulse(bpm): 71 Weight(lbs): 250 Blood Pressure(mmHg): 179/64  Body Mass Index(BMI): 36 Temperature(F): 97.9 Respiratory Rate(breaths/min): 20 Photos: [1:No Photos Left, Lateral Lower Leg] [N/A:N/A N/A] Wound Location: [1:Trauma] [N/A:N/A] Wounding Event: [1:Venous Leg Ulcer] [N/A:N/A] Primary Etiology: [1:Sleep Apnea, Arrhythmia, Coronary N/A] Comorbid History: [1:Artery Disease, Hypertension, Myocardial Infarction, Peripheral Arterial Disease, Type II Diabetes, Neuropathy 05/01/2019] [N/A:N/A] Date Acquired: [1:1] [N/A:N/A] Weeks of Treatment: [1:Open] [N/A:N/A] Wound Status: [1:1.4x1.4x0.1] [N/A:N/A] Measurements L x W x D (cm) [1:1.539] [N/A:N/A] A (cm) : rea [1:0.154] [N/A:N/A] Volume (cm) : [1:77.60%] [N/A:N/A] % Reduction in Area: [1:77.60%] [N/A:N/A] % Reduction in Volume: [1:Full Thickness Without Exposed] [N/A:N/A] Classification: [1:Support Structures Medium] [N/A:N/A] Exudate Amount: [1:Serosanguineous] [N/A:N/A] Exudate Type: [1:red, brown] [N/A:N/A] Exudate Color: [1:Flat and Intact] [N/A:N/A] Wound Margin: [1:Large (67-100%)] [N/A:N/A] Granulation Amount: [1:Red] [N/A:N/A] Granulation Quality: [1:None Present (0%)] [N/A:N/A] Necrotic Amount: [1:Fat Layer (Subcutaneous Tissue): Yes N/A] Exposed  Structures: [1:Fascia: No Tendon: No Muscle: No Joint: No Bone: No Medium (34-66%)] [N/A:N/A] Epithelialization: [1:Compression Therapy] [N/A:N/A] Treatment Notes Wound #1 (Left, Lateral Lower Leg) 1. Cleanse With Soap and water 2. Periwound Care Moisturizing lotion 3. Primary Dressing Applied Hydrofera Blue 4. Secondary Dressing ABD Pad Dry Gauze 6. Support Layer Applied 3 layer compression Water quality scientist) Signed: 05/21/2020 4:57:00 PM By: Linton Ham MD Signed: 05/22/2020 9:08:43 AM By: Carlene Coria RN Entered By: Linton Ham on 05/21/2020 13:10:28 -------------------------------------------------------------------------------- [place] Details Patient Name: Date of Service: Isabel Caprice, [place] A. 05/21/2020 10:45 A M Medical Record Number: [id number] Patient Account Number: [id number] Date of Birth/Sex: Treating RN: 03/13/1956 (63 y.o. Jerilynn Mages) Carlene Coria Primary Care Alleya Demeter: Merita Norton ULT, PRO V IDER Other Clinician: Referring Roselle Norton: Treating Chayil Gantt/Extender: Pleas Koch, Bindubal Weeks in Treatment: 1 Active Inactive Abuse / Safety / Falls / Self Care Management Nursing Diagnoses: Potential for falls Goals: Patient will remain injury free related to falls Date Initiated: 05/14/2020 Target Resolution Date: 06/28/2020 Goal Status: Active Interventions: Provide education on fall prevention Notes: Venous Leg Ulcer Nursing Diagnoses: Potential for venous Insuffiency (use before diagnosis confirmed) Goals: Non-invasive venous studies are completed as ordered Date Initiated: 05/14/2020 Target Resolution Date: 05/31/2020 Goal Status: Active Interventions: Provide education on venous insufficiency Treatment Activities: Non-invasive vascular studies : 05/14/2020 Notes: Electronic Signature(s) Signed: 05/22/2020 9:08:43 AM By: Carlene Coria RN Entered By: Carlene Coria on 05/21/2020  10:53:53 -------------------------------------------------------------------------------- Pain Assessment Details Patient Name: Date of Service: Isabel Caprice, [place] A. 05/21/2020 10:45 A M Medical Record Number: [id number] Patient Account Number: [id number] Date of Birth/Sex: Treating RN: 1955-09-05 (63 y.o. Jerilynn Mages) Carlene Coria Primary Care Avia Merkley: Merita Norton ULT, PRO [address] Filter Other Clinician: Referring Zulma Court: Treating Justis Closser/Extender: Pleas Koch, Bindubal Weeks in Treatment: 1 Active Problems Location of Pain Severity and Description of Pain Patient Has Paino No Patient Has Paino No Site Locations Pain Management and Medication Current Pain Management: Electronic Signature(s) Signed: 05/21/2020 4:13:14 PM By: Sandre Kitty Signed: 05/22/2020 9:08:43 AM By: Carlene Coria RN Entered By: Sandre Kitty on 05/21/2020 11:04:03 -------------------------------------------------------------------------------- Patient/Caregiver Education Details Patient Name: Date of Service: Vella Raring [place] A. 11/2/2021andnbsp10:45 A M Medical Record Number: [id number] Patient Account Number: [id number] Date of Birth/Gender: Treating RN: [birth year] (63 y.o. Oval Linsey Primary Care Physician: Merita Norton ULT, PRO V IDER Other Clinician: Referring Physician: Treating Physician/Extender: Pleas Koch, Loura Back in Treatment: 1 Education Assessment Education Provided To: Patient Education Topics Provided Safety: Methods: Explain/Verbal Responses: State content correctly Electronic Signature(s) Signed: 05/22/2020 9:08:43 AM By: Carlene Coria RN Entered By: Carlene Coria on 05/21/2020 10:54:09 -------------------------------------------------------------------------------- Wound Assessment Details Patient Name: Date of Service: Leitz[place],  [name] 05/21/2020 10:45 A M Medical Record Number: [id number] Patient Account Number: [id number] Date of Birth/Sex: Treating RN: June 02, 1956 (63 y.o. Jerilynn Mages)  Carlene Coria Primary Care Lovenia Debruler: Other Clinician: Merita Norton ULT, PRO V IDER Referring Nole Robey: Treating Jamilette Suchocki/Extender: Pleas Koch, Bindubal Weeks in Treatment: 1 Wound Status Wound Number: 1 Primary Venous Leg Ulcer Etiology: Wound Location: Left, Lateral Lower Leg Wound Open Wounding Event: Trauma Status: Date Acquired: 05/01/2019 Comorbid Sleep Apnea, Arrhythmia, Coronary Artery Disease, Hypertension, Weeks Of Treatment: 1 History: Myocardial Infarction, Peripheral Arterial Disease, Type II Diabetes, Clustered Wound: No Neuropathy Wound Measurements Length: (cm) 1.4 Width: (cm) 1.4 Depth: (cm) 0.1 Area: (cm) 1.539 Volume: (cm) 0.154 % Reduction in Area: 77.6% % Reduction in Volume: 77.6% Epithelialization: Medium (34-66%) Tunneling: No Undermining: No Wound Description Classification: Full Thickness Without Exposed Support Structures Wound Margin: Flat and Intact Exudate Amount: Medium Exudate Type: Serosanguineous Exudate Color: red, brown Foul Odor After Cleansing: No Slough/Fibrino No Wound Bed Granulation Amount: Large (67-100%) Exposed Structure Granulation Quality: Red Fascia Exposed: No Necrotic Amount: None Present (0%) Fat Layer (Subcutaneous Tissue) Exposed: Yes Tendon Exposed: No Muscle Exposed: No Joint Exposed: No Bone Exposed: No Treatment Notes Wound #1 (Left, Lateral Lower Leg) 1. Cleanse With Soap and water 2. Periwound Care Moisturizing lotion 3. Primary Dressing Applied Hydrofera Blue 4. Secondary Dressing ABD Pad Dry Gauze 6. Support Layer Applied 3 layer compression wrap Electronic Signature(s) Signed: 05/22/2020 9:08:43 AM By: Carlene Coria RN Signed: 05/23/2020 5:54:11 PM By: Levan Hurst RN, BSN Entered By: Levan Hurst on 05/21/2020 11:21:30 -------------------------------------------------------------------------------- Vitals Details Patient Name: Date of Service: Isabel Caprice, [place] A. 05/21/2020 10:45 A  M Medical Record Number: [id number] Patient Account Number: [id number] Date of Birth/Sex: Treating RN: 06-30-1956 (63 y.o. Jerilynn Mages) Carlene Coria Primary Care Havard Radigan: Merita Norton ULT, PRO V IDER Other Clinician: Referring Drevin Ortner: Treating Meyah Corle/Extender: Pleas Koch, Bindubal Weeks in Treatment: 1 Vital Signs Time Taken: 11:03 Temperature (F): 97.9 Height (in): 70 Pulse (bpm): 71 Weight (lbs): 250 Respiratory Rate (breaths/min): 20 Body Mass Index (BMI): 35.9 Blood Pressure (mmHg): 179/64 Reference Range: 80 - 120 mg / dl Electronic Signature(s) Signed: 05/21/2020 4:13:14 PM By: Sandre Kitty Entered By: Sandre Kitty on 05/21/2020 11:03:58

## 2020-05-27 DIAGNOSIS — S80812D Abrasion, left lower leg, subsequent encounter: Secondary | ICD-10-CM | POA: Diagnosis not present

## 2020-05-30 DIAGNOSIS — I87312 Chronic venous hypertension (idiopathic) with ulcer of left lower extremity: Secondary | ICD-10-CM | POA: Diagnosis not present

## 2020-05-30 DIAGNOSIS — I87321 Chronic venous hypertension (idiopathic) with inflammation of right lower extremity: Secondary | ICD-10-CM | POA: Diagnosis not present

## 2020-05-30 DIAGNOSIS — E1142 Type 2 diabetes mellitus with diabetic polyneuropathy: Secondary | ICD-10-CM | POA: Diagnosis not present

## 2020-05-30 DIAGNOSIS — I87323 Chronic venous hypertension (idiopathic) with inflammation of bilateral lower extremity: Secondary | ICD-10-CM | POA: Diagnosis not present

## 2020-05-30 DIAGNOSIS — I2510 Atherosclerotic heart disease of native coronary artery without angina pectoris: Secondary | ICD-10-CM | POA: Diagnosis not present

## 2020-05-30 DIAGNOSIS — I4891 Unspecified atrial fibrillation: Secondary | ICD-10-CM | POA: Diagnosis not present

## 2020-05-30 DIAGNOSIS — I10 Essential (primary) hypertension: Secondary | ICD-10-CM | POA: Diagnosis not present

## 2020-05-30 DIAGNOSIS — L97821 Non-pressure chronic ulcer of other part of left lower leg limited to breakdown of skin: Secondary | ICD-10-CM | POA: Diagnosis not present

## 2020-05-30 DIAGNOSIS — E1151 Type 2 diabetes mellitus with diabetic peripheral angiopathy without gangrene: Secondary | ICD-10-CM | POA: Diagnosis not present

## 2020-05-30 DIAGNOSIS — S80812D Abrasion, left lower leg, subsequent encounter: Secondary | ICD-10-CM | POA: Diagnosis not present

## 2020-06-04 ENCOUNTER — Encounter (HOSPITAL_BASED_OUTPATIENT_CLINIC_OR_DEPARTMENT_OTHER): Payer: Medicare HMO | Admitting: Internal Medicine

## 2020-06-04 ENCOUNTER — Other Ambulatory Visit: Payer: Self-pay

## 2020-06-04 DIAGNOSIS — I252 Old myocardial infarction: Secondary | ICD-10-CM | POA: Diagnosis not present

## 2020-06-04 DIAGNOSIS — L97821 Non-pressure chronic ulcer of other part of left lower leg limited to breakdown of skin: Secondary | ICD-10-CM | POA: Diagnosis not present

## 2020-06-04 DIAGNOSIS — G4733 Obstructive sleep apnea (adult) (pediatric): Secondary | ICD-10-CM | POA: Diagnosis not present

## 2020-06-04 DIAGNOSIS — E11622 Type 2 diabetes mellitus with other skin ulcer: Secondary | ICD-10-CM | POA: Diagnosis not present

## 2020-06-04 DIAGNOSIS — I110 Hypertensive heart disease with heart failure: Secondary | ICD-10-CM | POA: Diagnosis not present

## 2020-06-04 DIAGNOSIS — I5032 Chronic diastolic (congestive) heart failure: Secondary | ICD-10-CM | POA: Diagnosis not present

## 2020-06-04 DIAGNOSIS — I2510 Atherosclerotic heart disease of native coronary artery without angina pectoris: Secondary | ICD-10-CM | POA: Diagnosis not present

## 2020-06-04 DIAGNOSIS — E1151 Type 2 diabetes mellitus with diabetic peripheral angiopathy without gangrene: Secondary | ICD-10-CM | POA: Diagnosis not present

## 2020-06-04 DIAGNOSIS — I4891 Unspecified atrial fibrillation: Secondary | ICD-10-CM | POA: Diagnosis not present

## 2020-06-04 DIAGNOSIS — I872 Venous insufficiency (chronic) (peripheral): Secondary | ICD-10-CM | POA: Diagnosis not present

## 2020-06-04 DIAGNOSIS — I87322 Chronic venous hypertension (idiopathic) with inflammation of left lower extremity: Secondary | ICD-10-CM | POA: Diagnosis not present

## 2020-06-04 NOTE — Progress Notes (Signed)
THERRON, SELLS ([id number]) Visit Report for 06/04/2020 HPI Details Patient Name: Date of Service: [name], [name] [place] A. 06/04/2020 10:30 A M Medical Record Number: [id number] Patient Account Number: [id number] Date of Birth/Sex: Treating RN: July 04, 1956 (64 y.o. Jerilynn Mages) Carlene Coria Primary Care Provider: Merita Norton ULT, PRO V IDER Other Clinician: Referring Provider: Treating Provider/Extender: Pleas Koch, Bindubal Weeks in Treatment: 3 History of Present Illness HPI Description: ADMISSION 05/14/2020; this is a 64 year old man with multiple medical issues. Predominantly he has type 2 diabetes with a history of peripheral neuropathy and also history of fairly significant PAD. He had a left superficial femoral to posterior tibial artery bypass in February 2017 he also had an atherectomy and angioplasty by Dr. Gwenlyn Found of the right popliteal artery in 2016. He is supposed to be getting arterial studies annually however this was interrupted last year because of the pandemic. He tells [place] he was at [facility] 2 weeks ago was getting out of of the scooter and traumatized his left lateral lower leg. There was a lot of bleeding as the patient is on Plavix and Eliquis. They have been dressing this with Neosporin and doing a fairly good job. Wound measures 2.5 x 3.5 it does not have any depth he does not have a wound history in his legs outside of surgery however he does have chronic edema and skin changes suggestive of chronic venous disease possibly some degree of lymphedema as well. Past medical history includes type 2 diabetes with peripheral neuropathy and gait instability, lumbar spondylosis, obstructive sleep apnea, history of a left pontine CVA, basal cell skin cancer, atrial fibrillation on anticoagulation, significant PAD as noted with a left superficial femoral to posterior tibial bypass in February 2017 and a right popliteal atherectomy and angioplasty by Dr. Gwenlyn Found in 30th 2016. He also has a history of  coronary artery disease with an MI in 2002 hypertension hyperlipidemia and heart failure with preserved ejection fraction His last arterial studies I can see in epic were on 03/10/2018 this showed a right ABI of 0.69 and a right TBI of 0.5 with monophasic waveforms on the right. On the left his ABI was 1.20 with a TBI of 0.92 and triphasic waveforms. He has not had arterial studies since. Our nurse in the clinic got an ABI on the left of 1.1 11/2; left anterior leg wound in the setting of chronic venous insufficiency. Wound was initially trauma. We have been using Hydrofera Blue under compression he has home health.. The wound looks a lot better today with improvement in surface area 11/16; left anterior leg wound in the setting of chronic venous insufficiency. Wound was initially trauma. We have been using Hydrofera Blue under compression. The patient is closed today. He is supposed to follow-up with vein and vascular with regards to arterial insufficiency nevertheless his leg wounds are Electronic Signature(s) Signed: 06/04/2020 4:54:15 PM By: Linton Ham MD Entered By: Linton Ham on 06/04/2020 12:35:12 -------------------------------------------------------------------------------- Physical Exam Details Patient Name: Date of Service: [name], [name] [place] A. 06/04/2020 10:30 A M Medical Record Number: [id number] Patient Account Number: [id number] Date of Birth/Sex: Treating RN: 11/09/1955 (64 y.o. M) Carlene Coria Primary Care Provider: Merita Norton ULT, PRO V IDER Other Clinician: Referring Provider: Treating Provider/Extender: Pleas Koch, Bindubal Weeks in Treatment: 3 Notes Wound exam; the area in question on the left lateral calf this is completely closed he has significant changes of chronic venous insufficiency in both legs hemosiderin deposition mild pitting edema. There is no evidence of infection and he  has no open wound today. Electronic Signature(s) Signed: 06/04/2020 4:54:15  PM By: Linton Ham MD Entered By: Linton Ham on 06/04/2020 12:35:41 -------------------------------------------------------------------------------- Physician Orders Details Patient Name: Date of Service: [name], [name] [place] A. 06/04/2020 10:30 A M Medical Record Number: [id number] Patient Account Number: [id number] Date of Birth/Sex: Treating RN: 11-May-1956 (64 y.o. Jerilynn Mages) Carlene Coria Primary Care Provider: Merita Norton ULT, PRO V IDER Other Clinician: Referring Provider: Treating Provider/Extender: Pleas Koch, Bindubal Weeks in Treatment: 3 Verbal / Phone Orders: No Diagnosis Coding ICD-10 Coding Code Description S80.812D Abrasion, left lower leg, subsequent encounter L97.821 Non-pressure chronic ulcer of other part of left lower leg limited to breakdown of skin E11.51 Type 2 diabetes mellitus with diabetic peripheral angiopathy without gangrene I87.323 Chronic venous hypertension (idiopathic) with inflammation of bilateral lower extremity Discharge From [facility] Services Discharge from [place] Edema Control Avoid standing for long periods of time Elevate legs to the level of the heart or above for 30 minutes daily and/or when sitting, a frequency of: Exercise regularly Support Garment 20-30 mm/Hg pressure to: - patient to wear stocking daily on in the am, off in the pm Other: - apply lotion in the pm, daily Additional Orders / Instructions Other: - patient needs to follow up with Vein and Vascular related to vascular status. Electronic Signature(s) Signed: 06/04/2020 4:54:15 PM By: Linton Ham MD Signed: 06/04/2020 5:00:30 PM By: Carlene Coria RN Entered By: Carlene Coria on 06/04/2020 11:50:36 -------------------------------------------------------------------------------- Problem List Details Patient Name: Date of Service: [name], [name] [place] A. 06/04/2020 10:30 A M Medical Record Number: [id number] Patient Account Number: [id number] Date of Birth/Sex: Treating  RN: 1956-01-15 (64 y.o. Jerilynn Mages) Carlene Coria Primary Care Provider: Merita Norton ULT, PRO V IDER Other Clinician: Referring Provider: Treating Provider/Extender: Pleas Koch, Bindubal Weeks in Treatment: 3 Active Problems ICD-10 Encounter Code Description Active Date MDM Diagnosis S80.812D Abrasion, left lower leg, subsequent encounter 05/14/2020 No Yes L97.821 Non-pressure chronic ulcer of other part of left lower leg limited to breakdown 05/14/2020 No Yes of skin E11.51 Type 2 diabetes mellitus with diabetic peripheral angiopathy without gangrene 05/14/2020 No Yes I87.323 Chronic venous hypertension (idiopathic) with inflammation of bilateral lower 05/14/2020 No Yes extremity Inactive Problems Resolved Problems Electronic Signature(s) Signed: 06/04/2020 4:54:15 PM By: Linton Ham MD Entered By: Linton Ham on 06/04/2020 12:33:54 -------------------------------------------------------------------------------- Progress Note Details Patient Name: Date of Service: [name], [name] [place] A. 06/04/2020 10:30 A M Medical Record Number: [id number] Patient Account Number: [id number] Date of Birth/Sex: Treating RN: 1956/04/18 (63 y.o. Jerilynn Mages) Carlene Coria Primary Care Provider: Merita Norton ULT, PRO V IDER Other Clinician: Referring Provider: Treating Provider/Extender: Pleas Koch, Bindubal Weeks in Treatment: 3 Subjective History of Present Illness (HPI) ADMISSION 05/14/2020; this is a 64 year old man with multiple medical issues. Predominantly he has type 2 diabetes with a history of peripheral neuropathy and also history of fairly significant PAD. He had a left superficial femoral to posterior tibial artery bypass in February 2017 he also had an atherectomy and angioplasty by Dr. Gwenlyn Found of the right popliteal artery in 2016. He is supposed to be getting arterial studies annually however this was interrupted last year because of the pandemic. He tells [place] he was at [facility] 2 weeks ago was  getting out of of the scooter and traumatized his left lateral lower leg. There was a lot of bleeding as the patient is on Plavix and Eliquis. They have been dressing this with Neosporin and doing a fairly good job. Wound measures 2.5 x 3.5 it does not  have any depth he does not have a wound history in his legs outside of surgery however he does have chronic edema and skin changes suggestive of chronic venous disease possibly some degree of lymphedema as well. Past medical history includes type 2 diabetes with peripheral neuropathy and gait instability, lumbar spondylosis, obstructive sleep apnea, history of a left pontine CVA, basal cell skin cancer, atrial fibrillation on anticoagulation, significant PAD as noted with a left superficial femoral to posterior tibial bypass in February 2017 and a right popliteal atherectomy and angioplasty by Dr. Gwenlyn Found in 30th 2016. He also has a history of coronary artery disease with an MI in 2002 hypertension hyperlipidemia and heart failure with preserved ejection fraction His last arterial studies I can see in epic were on 03/10/2018 this showed a right ABI of 0.69 and a right TBI of 0.5 with monophasic waveforms on the right. On the left his ABI was 1.20 with a TBI of 0.92 and triphasic waveforms. He has not had arterial studies since. Our nurse in the clinic got an ABI on the left of 1.1 11/2; left anterior leg wound in the setting of chronic venous insufficiency. Wound was initially trauma. We have been using Hydrofera Blue under compression he has home health.. The wound looks a lot better today with improvement in surface area 11/16; left anterior leg wound in the setting of chronic venous insufficiency. Wound was initially trauma. We have been using Hydrofera Blue under compression. The patient is closed today. He is supposed to follow-up with vein and vascular with regards to arterial insufficiency nevertheless his leg  wounds are Objective Constitutional Vitals Time Taken: 10:54 AM, Height: 70 in, Weight: 250 lbs, BMI: 35.9, Temperature: 98.1 F, Pulse: 53 bpm, Respiratory Rate: 20 breaths/min, Blood Pressure: 168/70 mmHg. Integumentary (Hair, Skin) Wound #1 status is Healed - Epithelialized. Original cause of wound was Trauma. The wound is located on the Left,Lateral Lower Leg. The wound measures 0cm length x 0cm width x 0cm depth; 0cm^2 area and 0cm^3 volume. There is no tunneling or undermining noted. There is a none present amount of drainage noted. The wound margin is flat and intact. There is no granulation within the wound bed. There is no necrotic tissue within the wound bed. Assessment Active Problems ICD-10 Abrasion, left lower leg, subsequent encounter Non-pressure chronic ulcer of other part of left lower leg limited to breakdown of skin Type 2 diabetes mellitus with diabetic peripheral angiopathy without gangrene Chronic venous hypertension (idiopathic) with inflammation of bilateral lower extremity Plan Discharge From [facility] Services: Discharge from [place] Edema Control: Avoid standing for long periods of time Elevate legs to the level of the heart or above for 30 minutes daily and/or when sitting, a frequency of: Exercise regularly Support Garment 20-30 mm/Hg pressure to: - patient to wear stocking daily on in the am, off in the pm Other: - apply lotion in the pm, daily Additional Orders / Instructions: Other: - patient needs to follow up with Vein and Vascular related to vascular status. #1 the patient is discharged to his own standard stockings that we gave him his measurements for from elastic therapy 2. He does not have a history of chronic wounds in this area this is really his first occasion. Nevertheless we will see if he is compliant with the stockings 3. He does have or should have made a follow-up with vein and vascular with regards to follow-up of his arterial  insufficiency. Electronic Signature(s) Signed: 06/04/2020 4:54:15 PM By: Dellia Nims,  Legrand Como MD Entered By: Linton Ham on 06/04/2020 12:37:52 -------------------------------------------------------------------------------- SuperBill Details Patient Name: Date of Service: [name] Raring [place] A. 06/04/2020 Medical Record Number: [id number] Patient Account Number: [id number] Date of Birth/Sex: Treating RN: [birth year] (63 y.o. Jerilynn Mages) Carlene Coria Primary Care Provider: Merita Norton ULT, PRO V IDER Other Clinician: Referring Provider: Treating Provider/Extender: Pleas Koch, Bindubal Weeks in Treatment: 3 Diagnosis Coding ICD-10 Codes Code Description S80.812D Abrasion, left lower leg, subsequent encounter L97.821 Non-pressure chronic ulcer of other part of left lower leg limited to breakdown of skin E11.51 Type 2 diabetes mellitus with diabetic peripheral angiopathy without gangrene I87.323 Chronic venous hypertension (idiopathic) with inflammation of bilateral lower extremity Facility Procedures Physician Procedures : CPT4 Code Description Modifier [id number] - WC PHYS LEVEL 2 - EST PT ICD-10 Diagnosis Description S80.812D Abrasion, left lower leg, subsequent encounter L97.821 Non-pressure chronic ulcer of other part of left lower leg limited to breakdown of skin  I87.323 Chronic venous hypertension (idiopathic) with inflammation of bilateral lower extremity E11.51 Type 2 diabetes mellitus with diabetic peripheral angiopathy without gangrene Quantity: 1 Electronic Signature(s) Signed: 06/04/2020 4:54:15 PM By: Linton Ham MD Entered By: Linton Ham on 06/04/2020 12:42:21

## 2020-06-05 NOTE — Progress Notes (Signed)
FODAY, CONE ([id number]) Visit Report for 06/04/2020 Arrival Information Details Patient Name: Date of Service: [place], [place] A. 06/04/2020 10:30 A M Medical Record Number: [id number] Patient Account Number: [id number] Date of Birth/Sex: Treating RN: 23-Dec-1955 (63 y.o. Jerilynn Mages) Carlene Coria Primary Care Rendon Howell: Merita Norton ULT, PRO V IDER Other Clinician: Referring Matricia Begnaud: Treating Tennie Grussing/Extender: Pleas Koch, Bindubal Weeks in Treatment: 3 Visit Information History Since Last Visit Added or deleted any medications: No Patient Arrived: Ambulatory Any new allergies or adverse reactions: No Arrival Time: 10:53 Had a fall or experienced change in No Accompanied By: wife activities of daily living that may affect Transfer Assistance: None risk of falls: Patient Identification Verified: Yes Signs or symptoms of abuse/neglect since last visito No Secondary Verification Process Completed: Yes Hospitalized since last visit: No Patient Requires Transmission-Based Precautions: No Implantable device outside of the clinic excluding No Patient Has Alerts: Yes cellular tissue based products placed in the center Patient Alerts: Patient on Blood Thinner since last visit: Has Dressing in Place as Prescribed: Yes Pain Present Now: No Electronic Signature(s) Signed: 06/05/2020 11:19:23 AM By: Sandre Kitty Entered By: Sandre Kitty on 06/04/2020 10:54:08 -------------------------------------------------------------------------------- Clinic Level of Care Assessment Details Patient Name: Date of Service: [place], [place] RK A. 06/04/2020 10:30 A M Medical Record Number: [id number] Patient Account Number: [id number] Date of Birth/Sex: Treating RN: 1956/01/23 (63 y.o. Jerilynn Mages) Carlene Coria Primary Care Alecxander Mainwaring: Merita Norton ULT, PRO V IDER Other Clinician: Referring Scotty Weigelt: Treating Melita Villalona/Extender: Pleas Koch, Bindubal Weeks in Treatment: 3 Clinic Level of Care Assessment Items TOOL 4  Quantity Score X- 1 0 Use when only an EandM is performed on FOLLOW-UP visit ASSESSMENTS - Nursing Assessment / Reassessment X- 1 10 Reassessment of Co-morbidities (includes updates in patient status) X- 1 5 Reassessment of Adherence to Treatment Plan ASSESSMENTS - Wound and Skin A ssessment / Reassessment X - Simple Wound Assessment / Reassessment - one wound 1 5 []  - 0 Complex Wound Assessment / Reassessment - multiple wounds []  - 0 Dermatologic / Skin Assessment (not related to wound area) ASSESSMENTS - Focused Assessment []  - 0 Circumferential Edema Measurements - multi extremities []  - 0 Nutritional Assessment / Counseling / Intervention []  - 0 Lower Extremity Assessment (monofilament, tuning fork, pulses) []  - 0 Peripheral Arterial Disease Assessment (using hand held doppler) ASSESSMENTS - Ostomy and/or Continence Assessment and Care []  - 0 Incontinence Assessment and Management []  - 0 Ostomy Care Assessment and Management (repouching, etc.) PROCESS - Coordination of Care X - Simple Patient / Family Education for ongoing care 1 15 []  - 0 Complex (extensive) Patient / Family Education for ongoing care X- 1 10 Staff obtains Programmer, systems, Records, T Results / Process Orders est []  - 0 Staff telephones HHA, Nursing Homes / Clarify orders / etc []  - 0 Routine Transfer to another Facility (non-emergent condition) []  - 0 Routine Hospital Admission (non-emergent condition) []  - 0 New Admissions / Biomedical engineer / Ordering NPWT Apligraf, etc. , []  - 0 Emergency Hospital Admission (emergent condition) X- 1 10 Simple Discharge Coordination []  - 0 Complex (extensive) Discharge Coordination PROCESS - Special Needs []  - 0 Pediatric / Minor Patient Management []  - 0 Isolation Patient Management []  - 0 Hearing / Language / Visual special needs []  - 0 Assessment of Community assistance (transportation, D/C planning, etc.) []  - 0 Additional assistance / Altered  mentation []  - 0 Support Surface(s) Assessment (bed, cushion, seat, etc.) INTERVENTIONS - Wound Cleansing / Measurement X - Simple Wound Cleansing - one  wound 1 5 []  - 0 Complex Wound Cleansing - multiple wounds X- 1 5 Wound Imaging (photographs - any number of wounds) []  - 0 Wound Tracing (instead of photographs) X- 1 5 Simple Wound Measurement - one wound []  - 0 Complex Wound Measurement - multiple wounds INTERVENTIONS - Wound Dressings []  - 0 Small Wound Dressing one or multiple wounds []  - 0 Medium Wound Dressing one or multiple wounds []  - 0 Large Wound Dressing one or multiple wounds []  - 0 Application of Medications - topical []  - 0 Application of Medications - injection INTERVENTIONS - Miscellaneous []  - 0 External ear exam []  - 0 Specimen Collection (cultures, biopsies, blood, body fluids, etc.) []  - 0 Specimen(s) / Culture(s) sent or taken to Lab for analysis []  - 0 Patient Transfer (multiple staff / Civil Service fast streamer / Similar devices) []  - 0 Simple Staple / Suture removal (25 or less) []  - 0 Complex Staple / Suture removal (26 or more) []  - 0 Hypo / Hyperglycemic Management (close monitor of Blood Glucose) []  - 0 Ankle / Brachial Index (ABI) - do not check if billed separately X- 1 5 Vital Signs Has the patient been seen at the hospital within the last three years: Yes Total Score: 75 Level Of Care: New/Established - Level 2 Electronic Signature(s) Signed: 06/04/2020 5:00:30 PM By: Carlene Coria RN Entered By: Carlene Coria on 06/04/2020 11:51:45 -------------------------------------------------------------------------------- Lower Extremity Assessment Details Patient Name: Date of Service: Isabel Caprice, [place] A. 06/04/2020 10:30 A M Medical Record Number: [id number] Patient Account Number: [id number] Date of Birth/Sex: Treating RN: [birth year] (64 y.o. [name] Primary Care Ikeisha Blumberg: Merita Norton ULT, PRO V IDER Other Clinician: Referring Lorriann Hansmann: Treating  Caelie R[place]/Extender: Pleas Koch, Bindubal Weeks in Treatment: 3 Edema Assessment Assessed: [Left: No] [Right: No] Edema: [Left: Ye] [Right: s] Calf Left: Right: Point of Measurement: 32 cm From Medial Instep 40.5 cm Ankle Left: Right: Point of Measurement: 10 cm From Medial Instep 25 cm Vascular Assessment Pulses: Dorsalis Pedis Palpable: [Left:Yes] Electronic Signature(s) Signed: 06/05/2020 5:17:00 PM By: Levan Hurst RN, BSN Entered By: Levan Hurst on 06/04/2020 11:05:26 -------------------------------------------------------------------------------- Multi Wound Chart Details Patient Name: Date of Service: Isabel Caprice, [place] A. 06/04/2020 10:30 A M Medical Record Number: [id number] Patient Account Number: [id number] Date of Birth/Sex: Treating RN: Jul 31, 1955 (63 y.o. Jerilynn Mages) Carlene Coria Primary Care Ronav Furney: Merita Norton ULT, PRO V IDER Other Clinician: Referring Jarquis Walker: Treating Esti Demello/Extender: Pleas Koch, Bindubal Weeks in Treatment: 3 Vital Signs Height(in): 70 Pulse(bpm): 53 Weight(lbs): 250 Blood Pressure(mmHg): 168/70 Body Mass Index(BMI): 36 Temperature(F): 98.1 Respiratory Rate(breaths/min): 20 Photos: [1:No Photos Left, Lateral Lower Leg] [N/A:N/A N/A] Wound Location: [1:Trauma] [N/A:N/A] Wounding Event: [1:Venous Leg Ulcer] [N/A:N/A] Primary Etiology: [1:Sleep Apnea, Arrhythmia, Coronary] [N/A:N/A] Comorbid History: [1:Artery Disease, Hypertension, Myocardial Infarction, Peripheral Arterial Disease, Type II Diabetes, Neuropathy 05/01/2019] [N/A:N/A] Date Acquired: [1:3] [N/A:N/A] Weeks of Treatment: [1:Healed - Epithelialized] [N/A:N/A] Wound Status: [1:0x0x0] [N/A:N/A] Measurements L x W x D (cm) [1:0] [N/A:N/A] A (cm) : rea [1:0] [N/A:N/A] Volume (cm) : [1:100.00%] [N/A:N/A] % Reduction in Area: [1:100.00%] [N/A:N/A] % Reduction in Volume: [1:Full Thickness Without Exposed] [N/A:N/A] Classification: [1:Support Structures None Present]  [N/A:N/A] Exudate Amount: [1:Flat and Intact] [N/A:N/A] Wound Margin: [1:None Present (0%)] [N/A:N/A] Granulation Amount: [1:None Present (0%)] [N/A:N/A] Necrotic Amount: [1:Fascia: No] [N/A:N/A] Exposed Structures: [1:Fat Layer (Subcutaneous Tissue): No Tendon: No Muscle: No Joint: No Bone: No Large (67-100%)] [N/A:N/A] Treatment Notes Electronic Signature(s) Signed: 06/04/2020 4:54:15 PM By: Linton Ham MD Signed: 06/04/2020 5:00:30 PM By: Dolores Lory  Morey Hummingbird RN Entered By: Linton Ham on 06/04/2020 12:34:01 -------------------------------------------------------------------------------- Multi-Disciplinary Care Plan Details Patient Name: Date of Service: [place], [place] A. 06/04/2020 10:30 A M Medical Record Number: [id number] Patient Account Number: [id number] Date of Birth/Sex: Treating RN: 04/02/1956 (63 y.o. Jerilynn Mages) Carlene Coria Primary Care Mikael Skoda: Merita Norton ULT, PRO V IDER Other Clinician: Referring Storey Stangeland: Treating Christoph Copelan/Extender: Pleas Koch, Bindubal Weeks in Treatment: 3 Active Inactive Electronic Signature(s) Signed: 06/04/2020 5:00:30 PM By: Carlene Coria RN Entered By: Carlene Coria on 06/04/2020 12:00:34 -------------------------------------------------------------------------------- Pain Assessment Details Patient Name: Date of Service: Isabel Caprice, [place] A. 06/04/2020 10:30 A M Medical Record Number: [id number] Patient Account Number: [id number] Date of Birth/Sex: Treating RN: 1956-02-27 (63 y.o. Jerilynn Mages) Carlene Coria Primary Care Karsen Nakanishi: Merita Norton ULT, PRO V IDER Other Clinician: Referring Tarica Harl: Treating Kaire Stary/Extender: Pleas Koch, Bindubal Weeks in Treatment: 3 Active Problems Location of Pain Severity and Description of Pain Patient Has Paino No Site Locations Pain Management and Medication Current Pain Management: Electronic Signature(s) Signed: 06/04/2020 5:00:30 PM By: Carlene Coria RN Signed: 06/05/2020 11:19:23 AM By: Sandre Kitty Entered By: Sandre Kitty on 06/04/2020 10:55:24 -------------------------------------------------------------------------------- Patient/Caregiver Education Details Patient Name: Date of Service: Vella [place] RK A. 11/16/2021andnbsp10:30 A M Medical Record Number: [id number] Patient Account Number: [id number] Date of Birth/Gender: Treating RN: [birth year] (63 y.o. Jerilynn Mages) Carlene Coria Primary Care Physician: Merita Norton ULT, PRO V IDER Other Clinician: Referring Physician: Treating Physician/Extender: Pleas Koch, Loura Back in Treatment: 3 Education Assessment Education Provided To: Patient Education Topics Provided Safety: Methods: Explain/Verbal Responses: State content correctly Venous: Methods: Explain/Verbal Responses: State content correctly Electronic Signature(s) Signed: 06/04/2020 5:00:30 PM By: Carlene Coria RN Entered By: Carlene Coria on 06/04/2020 12:00:43 -------------------------------------------------------------------------------- Wound Assessment Details Patient Name: Date of Service: [place], [place] A. 06/04/2020 10:30 A M Medical Record Number: [id number] Patient Account Number: [id number] Date of Birth/Sex: Treating RN: 01-05-1956 (63 y.o. Jerilynn Mages) Carlene Coria Primary Care Jeric Slagel: Merita Norton ULT, PRO V IDER Other Clinician: Referring Cathie Bonnell: Treating Marquise Lambson/Extender: Pleas Koch, Bindubal Weeks in Treatment: 3 Wound Status Wound Number: 1 Primary Venous Leg Ulcer Etiology: Wound Location: Left, Lateral Lower Leg Wound Healed - Epithelialized Wounding Event: Trauma Status: Date Acquired: 05/01/2019 Comorbid Sleep Apnea, Arrhythmia, Coronary Artery Disease, Hypertension, Weeks Of Treatment: 3 History: Myocardial Infarction, Peripheral Arterial Disease, Type II Diabetes, Clustered Wound: No Neuropathy Wound Measurements Length: (cm) Width: (cm) Depth: (cm) Area: (cm) Volume: (cm) 0 % Reduction in Area: 100% 0 % Reduction in  Volume: 100% 0 Epithelialization: Large (67-100%) 0 Tunneling: No 0 Undermining: No Wound Description Classification: Full Thickness Without Exposed Support Structures Wound Margin: Flat and Intact Exudate Amount: None Present Foul Odor After Cleansing: No Slough/Fibrino No Wound Bed Granulation Amount: None Present (0%) Exposed Structure Necrotic Amount: None Present (0%) Fascia Exposed: No Fat Layer (Subcutaneous Tissue) Exposed: No Tendon Exposed: No Muscle Exposed: No Joint Exposed: No Bone Exposed: No Electronic Signature(s) Signed: 06/04/2020 5:00:30 PM By: Carlene Coria RN Entered By: Carlene Coria on 06/04/2020 12:01:34 -------------------------------------------------------------------------------- [place] Details Patient Name: Date of Service: Isabel Caprice, [place] A. 06/04/2020 10:30 A M Medical Record Number: [id number] Patient Account Number: [id number] Date of Birth/Sex: Treating RN: 1955-09-06 (63 y.o. Jerilynn Mages) Carlene Coria Primary Care Khalilah Hoke: Merita Norton ULT, PRO V IDER Other Clinician: Referring Gustie Bobb: Treating Reyan Helle/Extender: Pleas Koch, Bindubal Weeks in Treatment: 3 Vital Signs Time Taken: 10:54 Temperature (F): 98.1 Height (in): 70 Pulse (bpm): 53 Weight (lbs): 250 Respiratory Rate (breaths/min): 20 Body Mass Index (BMI): 35.9 Blood Pressure (mmHg): 168/70 Reference  Range: 80 - 120 mg / dl Electronic Signature(s) Signed: 06/05/2020 11:19:23 AM By: Sandre Kitty Entered By: Sandre Kitty on 06/04/2020 10:55:18

## 2020-06-06 ENCOUNTER — Encounter (HOSPITAL_BASED_OUTPATIENT_CLINIC_OR_DEPARTMENT_OTHER): Payer: Medicare HMO | Admitting: Internal Medicine

## 2020-06-10 DIAGNOSIS — L821 Other seborrheic keratosis: Secondary | ICD-10-CM | POA: Diagnosis not present

## 2020-06-10 DIAGNOSIS — D225 Melanocytic nevi of trunk: Secondary | ICD-10-CM | POA: Diagnosis not present

## 2020-06-10 DIAGNOSIS — Z85828 Personal history of other malignant neoplasm of skin: Secondary | ICD-10-CM | POA: Diagnosis not present

## 2020-06-10 DIAGNOSIS — L570 Actinic keratosis: Secondary | ICD-10-CM | POA: Diagnosis not present

## 2020-06-10 DIAGNOSIS — L820 Inflamed seborrheic keratosis: Secondary | ICD-10-CM | POA: Diagnosis not present

## 2020-06-10 DIAGNOSIS — L918 Other hypertrophic disorders of the skin: Secondary | ICD-10-CM | POA: Diagnosis not present

## 2020-06-11 DIAGNOSIS — M47816 Spondylosis without myelopathy or radiculopathy, lumbar region: Secondary | ICD-10-CM | POA: Diagnosis not present

## 2020-06-11 DIAGNOSIS — M62830 Muscle spasm of back: Secondary | ICD-10-CM | POA: Diagnosis not present

## 2020-06-13 DIAGNOSIS — G4733 Obstructive sleep apnea (adult) (pediatric): Secondary | ICD-10-CM | POA: Diagnosis not present

## 2020-06-14 DIAGNOSIS — E1142 Type 2 diabetes mellitus with diabetic polyneuropathy: Secondary | ICD-10-CM | POA: Diagnosis not present

## 2020-06-14 DIAGNOSIS — I4891 Unspecified atrial fibrillation: Secondary | ICD-10-CM | POA: Diagnosis not present

## 2020-06-14 DIAGNOSIS — I87323 Chronic venous hypertension (idiopathic) with inflammation of bilateral lower extremity: Secondary | ICD-10-CM | POA: Diagnosis not present

## 2020-06-14 DIAGNOSIS — I87312 Chronic venous hypertension (idiopathic) with ulcer of left lower extremity: Secondary | ICD-10-CM | POA: Diagnosis not present

## 2020-06-14 DIAGNOSIS — L97821 Non-pressure chronic ulcer of other part of left lower leg limited to breakdown of skin: Secondary | ICD-10-CM | POA: Diagnosis not present

## 2020-06-14 DIAGNOSIS — S80812D Abrasion, left lower leg, subsequent encounter: Secondary | ICD-10-CM | POA: Diagnosis not present

## 2020-06-14 DIAGNOSIS — I87321 Chronic venous hypertension (idiopathic) with inflammation of right lower extremity: Secondary | ICD-10-CM | POA: Diagnosis not present

## 2020-06-14 DIAGNOSIS — E1151 Type 2 diabetes mellitus with diabetic peripheral angiopathy without gangrene: Secondary | ICD-10-CM | POA: Diagnosis not present

## 2020-06-14 DIAGNOSIS — I2510 Atherosclerotic heart disease of native coronary artery without angina pectoris: Secondary | ICD-10-CM | POA: Diagnosis not present

## 2020-06-14 DIAGNOSIS — I10 Essential (primary) hypertension: Secondary | ICD-10-CM | POA: Diagnosis not present

## 2020-06-16 ENCOUNTER — Other Ambulatory Visit: Payer: Self-pay | Admitting: Internal Medicine

## 2020-06-21 ENCOUNTER — Encounter (HOSPITAL_BASED_OUTPATIENT_CLINIC_OR_DEPARTMENT_OTHER): Payer: Medicare HMO | Attending: Internal Medicine | Admitting: Internal Medicine

## 2020-06-21 ENCOUNTER — Other Ambulatory Visit: Payer: Self-pay

## 2020-06-21 DIAGNOSIS — S80812D Abrasion, left lower leg, subsequent encounter: Secondary | ICD-10-CM | POA: Insufficient documentation

## 2020-06-21 DIAGNOSIS — I5030 Unspecified diastolic (congestive) heart failure: Secondary | ICD-10-CM | POA: Diagnosis not present

## 2020-06-21 DIAGNOSIS — L97222 Non-pressure chronic ulcer of left calf with fat layer exposed: Secondary | ICD-10-CM | POA: Diagnosis not present

## 2020-06-21 DIAGNOSIS — X58XXXD Exposure to other specified factors, subsequent encounter: Secondary | ICD-10-CM | POA: Insufficient documentation

## 2020-06-21 DIAGNOSIS — Z85828 Personal history of other malignant neoplasm of skin: Secondary | ICD-10-CM | POA: Insufficient documentation

## 2020-06-21 DIAGNOSIS — Z8673 Personal history of transient ischemic attack (TIA), and cerebral infarction without residual deficits: Secondary | ICD-10-CM | POA: Diagnosis not present

## 2020-06-21 DIAGNOSIS — E11622 Type 2 diabetes mellitus with other skin ulcer: Secondary | ICD-10-CM | POA: Insufficient documentation

## 2020-06-21 DIAGNOSIS — I872 Venous insufficiency (chronic) (peripheral): Secondary | ICD-10-CM | POA: Diagnosis not present

## 2020-06-21 DIAGNOSIS — I110 Hypertensive heart disease with heart failure: Secondary | ICD-10-CM | POA: Diagnosis not present

## 2020-06-21 DIAGNOSIS — L97821 Non-pressure chronic ulcer of other part of left lower leg limited to breakdown of skin: Secondary | ICD-10-CM | POA: Insufficient documentation

## 2020-06-21 DIAGNOSIS — E1151 Type 2 diabetes mellitus with diabetic peripheral angiopathy without gangrene: Secondary | ICD-10-CM | POA: Insufficient documentation

## 2020-06-21 DIAGNOSIS — E1142 Type 2 diabetes mellitus with diabetic polyneuropathy: Secondary | ICD-10-CM | POA: Insufficient documentation

## 2020-06-21 DIAGNOSIS — I87323 Chronic venous hypertension (idiopathic) with inflammation of bilateral lower extremity: Secondary | ICD-10-CM | POA: Insufficient documentation

## 2020-06-21 DIAGNOSIS — I87322 Chronic venous hypertension (idiopathic) with inflammation of left lower extremity: Secondary | ICD-10-CM | POA: Diagnosis not present

## 2020-06-21 DIAGNOSIS — L97822 Non-pressure chronic ulcer of other part of left lower leg with fat layer exposed: Secondary | ICD-10-CM | POA: Diagnosis not present

## 2020-06-21 NOTE — Progress Notes (Signed)
**Note [name]-Identified via Obfuscation** JALEAL, SCHLIEP ([id number]) Visit Report for 06/21/2020 HPI Details Patient Name: Date of Service: [name], [name] A. 06/21/2020 7:30 A M Medical Record Number: [id number] Patient Account Number: [id number] Date of Birth/Sex: Treating RN: 12/14/1955 (64 y.o. Ernestene Mention Primary Care Provider: Merita Norton ULT, PRO [address] [employer] Other Clinician: Referring Provider: Treating Provider/Extender: Pleas Koch, Bindubal Weeks in Treatment: 0 History of Present Illness HPI Description: ADMISSION 05/14/2020; this is a 64 year old man with multiple medical issues. Predominantly he has type 2 diabetes with a history of peripheral neuropathy and also history of fairly significant PAD. He had a left superficial femoral to posterior tibial artery bypass in February 2017 he also had an atherectomy and angioplasty by Dr. Gwenlyn Found of the right popliteal artery in 2016. He is supposed to be getting arterial studies annually however this was interrupted last year because of the pandemic. He tells [place] he was at [facility] 2 weeks ago was getting out of of the scooter and traumatized his left lateral lower leg. There was a lot of bleeding as the patient is on Plavix and Eliquis. They have been dressing this with Neosporin and doing a fairly good job. Wound measures 2.5 x 3.5 it does not have any depth he does not have a wound history in his legs outside of surgery however he does have chronic edema and skin changes suggestive of chronic venous disease possibly some degree of lymphedema as well. Past medical history includes type 2 diabetes with peripheral neuropathy and gait instability, lumbar spondylosis, obstructive sleep apnea, history of a left pontine CVA, basal cell skin cancer, atrial fibrillation on anticoagulation, significant PAD as noted with a left superficial femoral to posterior tibial bypass in February 2017 and a right popliteal atherectomy and angioplasty by Dr. Gwenlyn Found in 30th 2016. He also has a history of  coronary artery disease with an MI in 2002 hypertension hyperlipidemia and heart failure with preserved ejection fraction His last arterial studies I can see in epic were on 03/10/2018 this showed a right ABI of 0.69 and a right TBI of 0.5 with monophasic waveforms on the right. On the left his ABI was 1.20 with a TBI of 0.92 and triphasic waveforms. He has not had arterial studies since. Our nurse in the clinic got an ABI on the left of 1.1 11/2; left anterior leg wound in the setting of chronic venous insufficiency. Wound was initially trauma. We have been using Hydrofera Blue under compression he has home health.. The wound looks a lot better today with improvement in surface area 11/16; left anterior leg wound in the setting of chronic venous insufficiency. Wound was initially trauma. We have been using Hydrofera Blue under compression. The patient is closed today. He is supposed to follow-up with vein and vascular with regards to arterial insufficiency nevertheless his leg wounds are 12/3; apparently 2 weeks ago when they were putting on their stockings they managed to get 3 wounds on the left anterior lower leg from abrasion when putting on the stockings. Home health came by the week of Thanksgiving and put Hydrofera Blue 4-layer wrap on this and there is only one superficial area remaining. The patient and his wife complained about the difficulties getting stockings on I think we are using 20/30. We will order bilateral external compression stockings which should be easier. Electronic Signature(s) Signed: 06/21/2020 12:44:20 PM By: Linton Ham MD Entered By: Linton Ham on 06/21/2020 08:22:29 -------------------------------------------------------------------------------- Physical Exam Details Patient Name: Date of Service: [name], [name] A. 06/21/2020  7:30 A M Medical Record Number: [id number] Patient Account Number: [id number] Date of Birth/Sex: Treating RN: August 11, 1955 (64 y.o. Ernestene Mention Primary Care Provider: Merita Norton ULT, [employer] [address] [employer] Other Clinician: Referring Provider: Treating Provider/Extender: Pleas Koch, Bindubal Weeks in Treatment: 0 Constitutional Patient is hypertensive.. Pulse regular and within target range for patient.[name] Respirations regular, non-labored and within target range.. Temperature is normal and within the target range for the patient.[name] Appears in no distress. Cardiovascular He has a robust posterior tibial pulse on the left, dorsalis pedis is palpable but not as robust. Notes Wound exam; the area in question is on the left anterior lower extremity. Very superficial wound. This looks like it is on his way to closing. There is no other wounds seen. He has very poor edema control in the right leg and I am not really sure what we left this. He is not previously had a wound here. No evidence of infection Electronic Signature(s) Signed: 06/21/2020 12:44:20 PM By: Linton Ham MD Entered By: Linton Ham on 06/21/2020 08:23:49 -------------------------------------------------------------------------------- Physician Orders Details Patient Name: Date of Service: [name], [name] A. 06/21/2020 7:30 A M Medical Record Number: [id number] Patient Account Number: [id number] Date of Birth/Sex: Treating RN: [birth year] (64 y.o. Ernestene Mention Primary Care Provider: Merita Norton ULT, [employer] [address] [employer] Other Clinician: Referring Provider: Treating Provider/Extender: Pleas Koch, Bindubal Weeks in Treatment: 0 Verbal / Phone Orders: No Diagnosis Coding Follow-up Appointments Return Appointment in 1 week. Bathing/ Shower/ Hygiene May shower with protection but do not get wound dressing(s) wet. - use cast protector Edema Control - Lymphedema / SCD / Other Elevate legs to the level of the heart or above for 30 minutes daily and/or when sitting, a frequency of: Avoid standing for long periods of time. Patient to wear own compression stockings  every day. - right leg daily Exercise regularly Moisturize legs daily. Home Health New wound care orders this week; continue Home Health for wound care. May utilize formulary equivalent dressing for wound treatment orders unless otherwise specified. - [facility] - hold wound care this week Wound Treatment Wound #1 - Lower Leg Wound Laterality: Left, Lateral Cleanser: Soap and Water Other:weekly/30 Days Discharge Instructions: May shower and wash wound with dial antibacterial soap and water prior to dressing change. Peri-Wound Care: Sween Lotion (Moisturizing lotion) Other:weekly/30 Days Discharge Instructions: Apply moisturizing lotion as directed Prim Dressing: KerraCel Ag Gelling Fiber Dressing, 2x2 in (silver alginate) Other:weekly/30 Days ary Discharge Instructions: Apply silver alginate to wound bed as instructed Secondary Dressing: Woven Gauze Sponge, Non-Sterile 4x4 in Other:weekly/30 Days Discharge Instructions: Apply over primary dressing as directed. Compression Wrap: ThreePress (3 layer compression wrap) Other:weekly/30 Days Discharge Instructions: Apply three layer compression as directed. Compression Stockings: Jobst Farrow Wrap 4000 (DME) Left Leg Compression Amount: 30-40 mmHG Right Leg Compression Amount: 30-40 mmHG Discharge Instructions: Apply Francia Greaves daily as instructed. Apply first thing in the morning, remove at night before bed. Electronic Signature(s) Signed: 06/21/2020 12:44:20 PM By: Linton Ham MD Signed: 06/21/2020 5:01:53 PM By: Baruch Gouty RN, BSN Entered By: Baruch Gouty on 06/21/2020 10:33:38 -------------------------------------------------------------------------------- Problem List Details Patient Name: Date of Service: [name], [name] A. 06/21/2020 7:30 A M Medical Record Number: [id number] Patient Account Number: [id number] Date of Birth/Sex: Treating RN: 1955/12/20 (64 y.o. Ernestene Mention Primary Care Provider: Merita Norton ULT, [employer] [address] [employer] Other  Clinician: Referring Provider: Treating Provider/Extender: Pleas Koch, Bindubal Weeks in Treatment: 0 Active Problems ICD-10 Encounter Code Description Active Date MDM Diagnosis S80.812D Abrasion,  left lower leg, subsequent encounter 05/14/2020 No Yes L97.821 Non-pressure chronic ulcer of other part of left lower leg limited to breakdown 05/14/2020 No Yes of skin E11.51 Type 2 diabetes mellitus with diabetic peripheral angiopathy without gangrene 05/14/2020 No Yes I87.323 Chronic venous hypertension (idiopathic) with inflammation of bilateral lower 05/14/2020 No Yes extremity Inactive Problems Resolved Problems Electronic Signature(s) Signed: 06/21/2020 12:44:20 PM By: Linton Ham MD Entered By: Linton Ham on 06/21/2020 08:20:57 -------------------------------------------------------------------------------- Progress Note Details Patient Name: Date of Service: [name], [name] A. 06/21/2020 7:30 A M Medical Record Number: [id number] Patient Account Number: [id number] Date of Birth/Sex: Treating RN: Jul 20, 1956 (64 y.o. Ernestene Mention Primary Care Provider: Merita Norton ULT, PRO [address] [employer] Other Clinician: Referring Provider: Treating Provider/Extender: Pleas Koch, Bindubal Weeks in Treatment: 0 Subjective History of Present Illness (HPI) ADMISSION 05/14/2020; this is a 63 year old man with multiple medical issues. Predominantly he has type 2 diabetes with a history of peripheral neuropathy and also history of fairly significant PAD. He had a left superficial femoral to posterior tibial artery bypass in February 2017 he also had an atherectomy and angioplasty by Dr. Gwenlyn Found of the right popliteal artery in 2016. He is supposed to be getting arterial studies annually however this was interrupted last year because of the pandemic. He tells [place] he was at [facility] 2 weeks ago was getting out of of the scooter and traumatized his left lateral lower leg. There was a lot of  bleeding as the patient is on Plavix and Eliquis. They have been dressing this with Neosporin and doing a fairly good job. Wound measures 2.5 x 3.5 it does not have any depth he does not have a wound history in his legs outside of surgery however he does have chronic edema and skin changes suggestive of chronic venous disease possibly some degree of lymphedema as well. Past medical history includes type 2 diabetes with peripheral neuropathy and gait instability, lumbar spondylosis, obstructive sleep apnea, history of a left pontine CVA, basal cell skin cancer, atrial fibrillation on anticoagulation, significant PAD as noted with a left superficial femoral to posterior tibial bypass in February 2017 and a right popliteal atherectomy and angioplasty by Dr. Gwenlyn Found in 30th 2016. He also has a history of coronary artery disease with an MI in 2002 hypertension hyperlipidemia and heart failure with preserved ejection fraction His last arterial studies I can see in epic were on 03/10/2018 this showed a right ABI of 0.69 and a right TBI of 0.5 with monophasic waveforms on the right. On the left his ABI was 1.20 with a TBI of 0.92 and triphasic waveforms. He has not had arterial studies since. Our nurse in the clinic got an ABI on the left of 1.1 11/2; left anterior leg wound in the setting of chronic venous insufficiency. Wound was initially trauma. We have been using Hydrofera Blue under compression he has home health.. The wound looks a lot better today with improvement in surface area 11/16; left anterior leg wound in the setting of chronic venous insufficiency. Wound was initially trauma. We have been using Hydrofera Blue under compression. The patient is closed today. He is supposed to follow-up with vein and vascular with regards to arterial insufficiency nevertheless his leg wounds are 12/3; apparently 2 weeks ago when they were putting on their stockings they managed to get 3 wounds on the left anterior  lower leg from abrasion when putting on the stockings. Home health came by the week of Thanksgiving and put [facility]  wrap on this and there is only one superficial area remaining. The patient and his wife complained about the difficulties getting stockings on I think we are using 20/30. We will order bilateral external compression stockings which should be easier. Objective Constitutional Patient is hypertensive.. Pulse regular and within target range for patient.[name] Respirations regular, non-labored and within target range.. Temperature is normal and within the target range for the patient.[name] Appears in no distress. Vitals Time Taken: 7:52 AM, Temperature: 98.3 F, Pulse: 60 bpm, Respiratory Rate: 18 breaths/min, Blood Pressure: 176/95 mmHg. Cardiovascular He has a robust posterior tibial pulse on the left, dorsalis pedis is palpable but not as robust. General Notes: Wound exam; the area in question is on the left anterior lower extremity. Very superficial wound. This looks like it is on his way to closing. There is no other wounds seen. He has very poor edema control in the right leg and I am not really sure what we left this. He is not previously had a wound here. No evidence of infection Integumentary (Hair, Skin) Wound #1 status is Open. Original cause of wound was Trauma. The wound is located on the Left,Lateral Lower Leg. The wound measures 1cm length x 0.5cm width x 0.1cm depth; 0.393cm^2 area and 0.039cm^3 volume. There is Fat Layer (Subcutaneous Tissue) exposed. There is no tunneling or undermining noted. There is a medium amount of serosanguineous drainage noted. There is large (67-100%) pink granulation within the wound bed. There is no necrotic tissue within the wound bed. Assessment Active Problems ICD-10 Abrasion, left lower leg, subsequent encounter Non-pressure chronic ulcer of other part of left lower leg limited to breakdown of skin Type 2 diabetes mellitus with  diabetic peripheral angiopathy without gangrene Chronic venous hypertension (idiopathic) with inflammation of bilateral lower extremity Procedures Wound #1 Pre-procedure diagnosis of Wound #1 is a Venous Leg Ulcer located on the Left,Lateral Lower Leg . There was a Three Layer Compression Therapy Procedure by Deon Pilling, RN. Post procedure Diagnosis Wound #1: Same as Pre-Procedure Plan Follow-up Appointments: Return Appointment in 1 week. Bathing/ Shower/ Hygiene: May shower with protection but do not get wound dressing(s) wet. - use cast protector Edema Control - Lymphedema / SCD / Other: Elevate legs to the level of the heart or above for 30 minutes daily and/or when sitting, a frequency of: Avoid standing for long periods of time. Patient to wear own compression stockings every day. - right leg daily Exercise regularly Moisturize legs daily. Home Health: New wound care orders this week; continue Home Health for wound care. May utilize formulary equivalent dressing for wound treatment orders unless otherwise specified. - [facility] - hold wound care this week WOUND #1: - Lower Leg Wound Laterality: Left, Lateral Cleanser: Soap and Water Other:weekly/30 Days Discharge Instructions: May shower and wash wound with dial antibacterial soap and water prior to dressing change. Peri-Wound Care: Sween Lotion (Moisturizing lotion) Other:weekly/30 Days Discharge Instructions: Apply moisturizing lotion as directed Prim Dressing: KerraCel Ag Gelling Fiber Dressing, 2x2 in (silver alginate) Other:weekly/30 Days ary Discharge Instructions: Apply silver alginate to wound bed as instructed Secondary Dressing: Woven Gauze Sponge, Non-Sterile 4x4 in Other:weekly/30 Days Discharge Instructions: Apply over primary dressing as directed. Com pression Wrap: ThreePress (3 layer compression wrap) Other:weekly/30 Days Discharge Instructions: Apply three layer compression as directed. 1. We put silver  alginate and 3 layer compression on this man 2. I do not believe he has significant PAD 3. As he is having trouble with standard over the toe stockings will  order him bilateral juxta lites or similar 4. Ideally we should be able to discharge him again next week hopefully into bilateral lower extremity Electronic Signature(s) Signed: 06/21/2020 12:44:20 PM By: Linton Ham MD Entered By: Linton Ham on 06/21/2020 08:28:13 -------------------------------------------------------------------------------- SuperBill Details Patient Name: Date of Service: [name], [place] A. 06/21/2020 Medical Record Number: [id number] Patient Account Number: [id number] Date of Birth/Sex: Treating RN: 1955/11/05 (64 y.o. Ernestene Mention Primary Care Provider: Merita Norton ULT, PRO [address] [employer] Other Clinician: Referring Provider: Treating Provider/Extender: Pleas Koch, Bindubal Weeks in Treatment: 0 Diagnosis Coding ICD-10 Codes Code Description S80.812D Abrasion, left lower leg, subsequent encounter L97.821 Non-pressure chronic ulcer of other part of left lower leg limited to breakdown of skin E11.51 Type 2 diabetes mellitus with diabetic peripheral angiopathy without gangrene I87.323 Chronic venous hypertension (idiopathic) with inflammation of bilateral lower extremity Facility Procedures CPT4 Code: [id number] Description: (Facility Use Only) [phone] - [place] L[facility] LT LEG Modifier: Quantity: 1 Physician Procedures : CPT4 Code Description Modifier [id number] - WC PHYS LEVEL 3 - EST PT ICD-10 Diagnosis Description S80.812D Abrasion, left lower leg, subsequent encounter L97.821 Non-pressure chronic ulcer of other part of left lower leg limited to breakdown of skin  I87.323 Chronic venous hypertension (idiopathic) with inflammation of bilateral lower extremity Quantity: 1 Electronic Signature(s) Signed: 06/21/2020 12:44:20 PM By: Linton Ham MD Signed: 06/21/2020 12:44:20 PM By: Linton Ham MD Entered By: Linton Ham on 06/21/2020 08:26:02

## 2020-06-21 NOTE — Progress Notes (Signed)
SUEO, CULLEN ([id number]) Visit Report for 06/21/2020 Arrival Information Details Patient Name: Date of Service: [place], [place] Laurel A. 06/21/2020 7:30 A M Medical Record Number: [id number] Patient Account Number: [id number] Date of Birth/Sex: Treating RN: 09/03/1955 (63 y.o. Jerilynn Mages) Carlene Coria Primary Care Urbano Milhouse: Merita Norton ULT, PRO V IDER Other Clinician: Referring Azjah Pardo: Treating Sahithi Ordoyne/Extender: Pleas Koch, Bindubal Weeks in Treatment: 0 Visit Information History Since Last Visit All ordered tests and consults were completed: No Patient Arrived: Ambulatory Added or deleted any medications: No Arrival Time: 07:51 Any new allergies or adverse reactions: No Accompanied By: wife Had a fall or experienced change in No Transfer Assistance: None activities of daily living that may affect Patient Identification Verified: Yes risk of falls: Secondary Verification Process Completed: Yes Signs or symptoms of abuse/neglect since last visito No Patient Requires Transmission-Based Precautions: No Hospitalized since last visit: No Patient Has Alerts: No Implantable device outside of the clinic excluding No cellular tissue based products placed in the center since last visit: Has Dressing in Place as Prescribed: Yes Has Compression in Place as Prescribed: Yes Pain Present Now: No Electronic Signature(s) Signed: 06/21/2020 5:16:12 PM By: Carlene Coria RN Entered By: Carlene Coria on 06/21/2020 07:51:59 -------------------------------------------------------------------------------- Clinic Level of Care Assessment Details Patient Name: Date of Service: [place], [place] A. 06/21/2020 7:30 A M Medical Record Number: [id number] Patient Account Number: [id number] Date of Birth/Sex: Treating RN: 08/21/1955 (64 y.o. Ernestene Mention Primary Care Chancelor Hardrick: Merita Norton ULT, PRO Ave Filter Other Clinician: Referring Rohini Jaroszewski: Treating Avah Bashor/Extender: Pleas Koch, Bindubal Weeks in Treatment:  0 Clinic Level of Care Assessment Items TOOL 1 Quantity Score []  - 0 Use when EandM and Procedure is performed on INITIAL visit ASSESSMENTS - Nursing Assessment / Reassessment X- 1 20 General Physical Exam (combine w/ comprehensive assessment (listed just below) when performed on new pt. evals) X- 1 25 Comprehensive Assessment (HX, ROS, Risk Assessments, Wounds Hx, etc.) ASSESSMENTS - Wound and Skin Assessment / Reassessment []  - 0 Dermatologic / Skin Assessment (not related to wound area) ASSESSMENTS - Ostomy and/or Continence Assessment and Care []  - 0 Incontinence Assessment and Management []  - 0 Ostomy Care Assessment and Management (repouching, etc.) PROCESS - Coordination of Care X- 1 15 Simple Patient / Family Education for ongoing care []  - 0 Complex (extensive) Patient / Family Education for ongoing care X- 1 10 Staff obtains Programmer, systems, Records, T Results / Process Orders est []  - 0 Staff telephones HHA, Nursing Homes / Clarify orders / etc []  - 0 Routine Transfer to another Facility (non-emergent condition) []  - 0 Routine Hospital Admission (non-emergent condition) X- 1 15 New Admissions / Biomedical engineer / Ordering NPWT Apligraf, etc. , []  - 0 Emergency Hospital Admission (emergent condition) PROCESS - Special Needs []  - 0 Pediatric / Minor Patient Management []  - 0 Isolation Patient Management []  - 0 Hearing / Language / Visual special needs []  - 0 Assessment of Community assistance (transportation, D/C planning, etc.) []  - 0 Additional assistance / Altered mentation []  - 0 Support Surface(s) Assessment (bed, cushion, seat, etc.) INTERVENTIONS - Miscellaneous []  - 0 External ear exam []  - 0 Patient Transfer (multiple staff / Civil Service fast streamer / Similar devices) []  - 0 Simple Staple / Suture removal (25 or less) []  - 0 Complex Staple / Suture removal (26 or more) []  - 0 Hypo/Hyperglycemic Management (do not check if billed separately) []  -  0 Ankle / Brachial Index (ABI) - do not check if billed separately Has the patient  been seen at the hospital within the last three years: Yes Total Score: 85 Level Of Care: New/Established - Level 3 Electronic Signature(s) Signed: 06/21/2020 5:01:53 PM By: Baruch Gouty RN, BSN Entered By: Baruch Gouty on 06/21/2020 08:16:57 -------------------------------------------------------------------------------- Compression Therapy Details Patient Name: Date of Service: Isabel Caprice, [place] A. 06/21/2020 7:30 A M Medical Record Number: [id number] Patient Account Number: [id number] Date of Birth/Sex: Treating RN: February 01, 1956 (64 y.o. Ernestene Mention Primary Care Jamirah Zelaya: Merita Norton ULT, PRO Ave Filter Other Clinician: Referring Alizandra Loh: Treating Kindsey Eblin/Extender: Pleas Koch, Bindubal Weeks in Treatment: 0 Compression Therapy Performed for Wound Assessment: Wound #1 Left,Lateral Lower Leg Performed By: Clinician Deon Pilling, RN Compression Type: Three Layer Post Procedure Diagnosis Same as Pre-procedure Electronic Signature(s) Signed: 06/21/2020 5:01:53 PM By: Baruch Gouty RN, BSN Entered By: Baruch Gouty on 06/21/2020 08:17:23 -------------------------------------------------------------------------------- Encounter Discharge Information Details Patient Name: Date of Service: Isabel Caprice, [place] RK A. 06/21/2020 7:30 A M Medical Record Number: [id number] Patient Account Number: [id number] Date of Birth/Sex: Treating RN: [birth year] (64 y.o. Hessie Diener Primary Care Prosper [name]: Merita Norton ULT, PRO V IDER Other Clinician: Referring Andy Moye: Treating Vaughan Garfinkle/Extender: Pleas Koch, Bindubal Weeks in Treatment: 0 Encounter Discharge Information Items Discharge Condition: Stable Ambulatory Status: Ambulatory Discharge Destination: Home Transportation: Private Auto Accompanied By: wife Schedule Follow-up Appointment: Yes Clinical Summary of Care: Electronic Signature(s) Signed:  06/21/2020 5:19:29 PM By: Deon Pilling Entered By: Deon Pilling on 06/21/2020 08:41:27 -------------------------------------------------------------------------------- Lower Extremity Assessment Details Patient Name: Date of Service: [place], [place] A. 06/21/2020 7:30 A M Medical Record Number: [id number] Patient Account Number: [id number] Date of Birth/Sex: Treating RN: Mar 13, 1956 (63 y.o. Jerilynn Mages) Carlene Coria Primary Care Arieon Scalzo: Merita Norton ULT, PRO V IDER Other Clinician: Referring Lenox Bink: Treating Aleecia Tapia/Extender: Pleas Koch, Bindubal Weeks in Treatment: 0 Edema Assessment Assessed: [Left: No] [Right: No] E[Left: dema] [Right: :] Calf Left: Right: Point of Measurement: From Medial Instep 39 cm 41.3 cm Ankle Left: Right: Point of Measurement: From Medial Instep 25 cm 28.4 cm Knee To Floor Left: Right: From Medial Instep 40 cm 40 cm Electronic Signature(s) Signed: 06/21/2020 5:01:53 PM By: Baruch Gouty RN, BSN Signed: 06/21/2020 5:16:12 PM By: Carlene Coria RN Entered By: Baruch Gouty on 06/21/2020 08:23:04 -------------------------------------------------------------------------------- Multi Wound Chart Details Patient Name: Date of Service: Isabel Caprice, [place] A. 06/21/2020 7:30 A M Medical Record Number: [id number] Patient Account Number: [id number] Date of Birth/Sex: Treating RN: [birth year] (64 y.o. Ernestene Mention Primary Care Faun Mcqueen: Merita Norton ULT, PRO V IDER Other Clinician: Referring Hameed Kolar: Treating Patra Gherardi/Extender: Pleas Koch, Bindubal Weeks in Treatment: 0 Vital Signs Height(in): Pulse(bpm): 60 Weight(lbs): Blood Pressure(mmHg): 176/95 Body Mass Index(BMI): Temperature(F): 98.3 Respiratory Rate(breaths/min): 18 Photos: [1:No Photos Left, Lateral Lower Leg] [N/A:N/A N/A] Wound Location: [1:Trauma] [N/A:N/A] Wounding Event: [1:Venous Leg Ulcer] [N/A:N/A] Primary Etiology: [1:05/01/2019] [N/A:N/A] Date Acquired: [1:5] [N/A:N/A] Weeks of  Treatment: [1:Open] [N/A:N/A] Wound Status: [1:1x0.5x0.1] [N/A:N/A] Measurements L x W x D (cm) [1:0.393] [N/A:N/A] A (cm) : rea [1:0.039] [N/A:N/A] Volume (cm) : [1:94.30%] [N/A:N/A] % Reduction in A rea: [1:94.30%] [N/A:N/A] % Reduction in Volume: [1:Full Thickness Without Exposed] [N/A:N/A] Classification: [1:Support Structures Medium] [N/A:N/A] Exudate Amount: [1:Serosanguineous] [N/A:N/A] Exudate Type: [1:red, brown] [N/A:N/A] Exudate Color: [1:Large (67-100%)] [N/A:N/A] Granulation Amount: [1:Pink] [N/A:N/A] Granulation Quality: [1:None Present (0%)] [N/A:N/A] Necrotic Amount: [1:Fat Layer (Subcutaneous Tissue): Yes N/A] Exposed Structures: [1:Fascia: No Tendon: No Muscle: No Joint: No Bone: No Medium (34-66%)] [N/A:N/A] Epithelialization: [1:Compression Therapy] [N/A:N/A] Treatment Notes Electronic Signature(s) Signed: 06/21/2020 12:44:20 PM By: Linton Ham MD Signed: 06/21/2020 5:01:53 PM  By: Baruch Gouty RN, BSN Entered By: Linton Ham on 06/21/2020 08:21:17 -------------------------------------------------------------------------------- Multi-Disciplinary Care Plan Details Patient Name: Date of Service: [place], [place] RK A. 06/21/2020 7:30 A M Medical Record Number: [id number] Patient Account Number: [id number] Date of Birth/Sex: Treating RN: October 22, 1955 (64 y.o. Ernestene Mention Primary Care Rejeana Fadness: Merita Norton ULT, PRO Ave Filter Other Clinician: Referring Elisabetta Mishra: Treating Kerstin Crusoe/Extender: Pleas Koch, Bindubal Weeks in Treatment: 0 Active Inactive Venous Leg Ulcer Nursing Diagnoses: Knowledge deficit related to disease process and management Potential for venous Insuffiency (use before diagnosis confirmed) Goals: Patient will maintain optimal edema control Date Initiated: 06/21/2020 Target Resolution Date: 07/19/2020 Goal Status: Active Interventions: Assess peripheral edema status every visit. Compression as ordered Provide education on venous  insufficiency Treatment Activities: Therapeutic compression applied : 06/21/2020 Notes: Wound/Skin Impairment Nursing Diagnoses: Impaired tissue integrity Knowledge deficit related to ulceration/compromised skin integrity Goals: Patient/caregiver will verbalize understanding of skin care regimen Date Initiated: 06/21/2020 Target Resolution Date: 07/19/2020 Goal Status: Active Ulcer/skin breakdown will have a volume reduction of 30% by week 4 Date Initiated: 06/21/2020 Target Resolution Date: 07/19/2020 Goal Status: Active Interventions: Assess patient/caregiver ability to obtain necessary supplies Assess patient/caregiver ability to perform ulcer/skin care regimen upon admission and as needed Assess ulceration(s) every visit Provide education on ulcer and skin care Treatment Activities: Skin care regimen initiated : 06/21/2020 Topical wound management initiated : 06/21/2020 Notes: Electronic Signature(s) Signed: 06/21/2020 5:01:53 PM By: Baruch Gouty RN, BSN Entered By: Baruch Gouty on 06/21/2020 08:15:25 -------------------------------------------------------------------------------- Pain Assessment Details Patient Name: Date of Service: Isabel Caprice, [place] A. 06/21/2020 7:30 A M Medical Record Number: [id number] Patient Account Number: [id number] Date of Birth/Sex: Treating RN: 18-Mar-1956 (63 y.o. Jerilynn Mages) Carlene Coria Primary Care Yadriel Kerrigan: Merita Norton ULT, PRO Ave Filter Other Clinician: Referring Aaliyha Mumford: Treating Luka Stohr/Extender: Pleas Koch, Bindubal Weeks in Treatment: 0 Active Problems Location of Pain Severity and Description of Pain Patient Has Paino No Patient Has Paino No Site Locations Pain Management and Medication Current Pain Management: Electronic Signature(s) Signed: 06/21/2020 5:16:12 PM By: Carlene Coria RN Entered By: Carlene Coria on 06/21/2020 07:52:53 -------------------------------------------------------------------------------- Patient/Caregiver  Education Details Patient Name: Date of Service: Ileana Ladd 12/3/2021andnbsp7:30 A M Medical Record Number: [id number] Patient Account Number: [id number] Date of Birth/Gender: Treating RN: 1955/12/06 (64 y.o. Ernestene Mention Primary Care Physician: Merita Norton ULT, PRO V IDER Other Clinician: Referring Physician: Treating Physician/Extender: Pleas Koch, Loura Back in Treatment: 0 Education Assessment Education Provided To: Patient Education Topics Provided Venous: Methods: Explain/Verbal Responses: Reinforcements needed, State content correctly Wound/Skin Impairment: Methods: Explain/Verbal Responses: Reinforcements needed, State content correctly Electronic Signature(s) Signed: 06/21/2020 5:01:53 PM By: Baruch Gouty RN, BSN Entered By: Baruch Gouty on 06/21/2020 08:15:57 -------------------------------------------------------------------------------- Wound Assessment Details Patient Name: Date of Service: Isabel Caprice, MA [place] A. 06/21/2020 7:30 A M Medical Record Number: [id number] Patient Account Number: [id number] Date of Birth/Sex: Treating RN: 1956-06-11 (63 y.o. Jerilynn Mages) Carlene Coria Primary Care Barbarajean Kinzler: Merita Norton ULT, PRO V IDER Other Clinician: Referring Stashia Sia: Treating Adaliz Dobis/Extender: Pleas Koch, Bindubal Weeks in Treatment: 0 Wound Status Wound Number: 1 Primary Etiology: Venous Leg Ulcer Wound Location: Left, Lateral Lower Leg Wound Status: Open Wounding Event: Trauma Date Acquired: 05/01/2019 Weeks Of Treatment: 5 Clustered Wound: No Wound Measurements Length: (cm) 1 Width: (cm) 0.5 Depth: (cm) 0.1 Area: (cm) 0.393 Volume: (cm) 0.039 % Reduction in Area: 94.3% % Reduction in Volume: 94.3% Epithelialization: Medium (34-66%) Tunneling: No Undermining: No Wound Description Classification: Full Thickness Without Exposed Support Structures Exudate Amount: Medium Exudate Type: Serosanguineous  Exudate Color: red, brown Foul Odor  After Cleansing: No Slough/Fibrino Yes Wound Bed Granulation Amount: Large (67-100%) Exposed Structure Granulation Quality: Pink Fascia Exposed: No Necrotic Amount: None Present (0%) Fat Layer (Subcutaneous Tissue) Exposed: Yes Tendon Exposed: No Muscle Exposed: No Joint Exposed: No Bone Exposed: No Treatment Notes Wound #1 (Lower Leg) Wound Laterality: Left, Lateral Cleanser Soap and Water Discharge Instruction: May shower and wash wound with dial antibacterial soap and water prior to dressing change. Peri-Wound Care Sween Lotion (Moisturizing lotion) Discharge Instruction: Apply moisturizing lotion as directed Topical Primary Dressing KerraCel Ag Gelling Fiber Dressing, 2x2 in (silver alginate) Discharge Instruction: Apply silver alginate to wound bed as instructed Secondary Dressing Woven Gauze Sponge, Non-Sterile 4x4 in Discharge Instruction: Apply over primary dressing as directed. Secured With Compression Wrap ThreePress (3 layer compression wrap) Discharge Instruction: Apply three layer compression as directed. Compression Stockings Jobst Farrow Wrap 4000 Quantity: 1 Left Leg Compression Amount: 30-40 mmHg Right Leg Compression Amount: 30-40 mmHg Discharge Instruction: Apply Francia Greaves daily as instructed. Apply first thing in the morning, remove at night before bed. Add-Ons Notes educated patient on lotioning the right leg and applying the compression stocking daily and removing at night. patient and wife in agreement. Electronic Signature(s) Signed: 06/21/2020 5:16:12 PM By: Carlene Coria RN Entered By: Carlene Coria on 06/21/2020 08:08:00 -------------------------------------------------------------------------------- Vitals Details Patient Name: Date of Service: Isabel Caprice, [place] A. 06/21/2020 7:30 A M Medical Record Number: [id number] Patient Account Number: [id number] Date of Birth/Sex: Treating RN: [birth year] (63 y.o. Jerilynn Mages) Carlene Coria Primary Care Lawton Dollinger: Merita Norton  ULT, PRO V IDER Other Clinician: Referring Forrest Demuro: Treating Adriana Quinby/Extender: Pleas Koch, Bindubal Weeks in Treatment: 0 Vital Signs Time Taken: 07:52 Temperature (F): 98.3 Pulse (bpm): 60 Respiratory Rate (breaths/min): 18 Blood Pressure (mmHg): 176/95 Reference Range: 80 - 120 mg / dl Electronic Signature(s) Signed: 06/21/2020 5:16:12 PM By: Carlene Coria RN Entered By: Carlene Coria on 06/21/2020 07:52:16

## 2020-06-25 ENCOUNTER — Other Ambulatory Visit: Payer: Self-pay | Admitting: Cardiovascular Disease

## 2020-06-28 ENCOUNTER — Encounter (HOSPITAL_BASED_OUTPATIENT_CLINIC_OR_DEPARTMENT_OTHER): Payer: Medicare HMO | Admitting: Internal Medicine

## 2020-06-28 ENCOUNTER — Other Ambulatory Visit: Payer: Self-pay

## 2020-06-28 DIAGNOSIS — I110 Hypertensive heart disease with heart failure: Secondary | ICD-10-CM | POA: Diagnosis not present

## 2020-06-28 DIAGNOSIS — E1151 Type 2 diabetes mellitus with diabetic peripheral angiopathy without gangrene: Secondary | ICD-10-CM | POA: Diagnosis not present

## 2020-06-28 DIAGNOSIS — E1142 Type 2 diabetes mellitus with diabetic polyneuropathy: Secondary | ICD-10-CM | POA: Diagnosis not present

## 2020-06-28 DIAGNOSIS — X58XXXD Exposure to other specified factors, subsequent encounter: Secondary | ICD-10-CM | POA: Diagnosis not present

## 2020-06-28 DIAGNOSIS — S80812D Abrasion, left lower leg, subsequent encounter: Secondary | ICD-10-CM | POA: Diagnosis not present

## 2020-06-28 DIAGNOSIS — I5030 Unspecified diastolic (congestive) heart failure: Secondary | ICD-10-CM | POA: Diagnosis not present

## 2020-06-28 DIAGNOSIS — I87323 Chronic venous hypertension (idiopathic) with inflammation of bilateral lower extremity: Secondary | ICD-10-CM | POA: Diagnosis not present

## 2020-06-28 DIAGNOSIS — Z8673 Personal history of transient ischemic attack (TIA), and cerebral infarction without residual deficits: Secondary | ICD-10-CM | POA: Diagnosis not present

## 2020-06-28 DIAGNOSIS — E11622 Type 2 diabetes mellitus with other skin ulcer: Secondary | ICD-10-CM | POA: Diagnosis not present

## 2020-06-28 DIAGNOSIS — L97821 Non-pressure chronic ulcer of other part of left lower leg limited to breakdown of skin: Secondary | ICD-10-CM | POA: Diagnosis not present

## 2020-07-01 DIAGNOSIS — E7800 Pure hypercholesterolemia, unspecified: Secondary | ICD-10-CM | POA: Diagnosis not present

## 2020-07-01 DIAGNOSIS — E1165 Type 2 diabetes mellitus with hyperglycemia: Secondary | ICD-10-CM | POA: Diagnosis not present

## 2020-07-03 DIAGNOSIS — E870 Hyperosmolality and hypernatremia: Secondary | ICD-10-CM | POA: Diagnosis not present

## 2020-07-04 NOTE — Progress Notes (Signed)
FLYNN, GWYN ([id number]) Visit Report for 06/28/2020 HPI Details Patient Name: Date of Service: ESTERLINE, [place] A. 06/28/2020 10:00 A M Medical Record Number: [id number] Patient Account Number: [id number] Date of Birth/Sex: Treating RN: [birth year] (65 y.o. Ernestene Mention Primary Care Provider: Merita Norton ULT, PRO Ave Filter Other Clinician: Referring Provider: Treating Provider/Extender: Pleas Koch, Mindi Curling Weeks in Treatment: 6 History of Present Illness HPI Description: ADMISSION 05/14/2020; this is a 64 year old man with multiple medical issues. Predominantly he has type 2 diabetes with a history of peripheral neuropathy and also history of fairly significant PAD. He had a left superficial femoral to posterior tibial artery bypass in February 2017 he also had an atherectomy and angioplasty by Dr. Gwenlyn Found of the right popliteal artery in 2016. He is supposed to be getting arterial studies annually however this was interrupted last year because of the pandemic. He tells [place] he was at [facility] 2 weeks ago was getting out of of the scooter and traumatized his left lateral lower leg. There was a lot of bleeding as the patient is on Plavix and Eliquis. They have been dressing this with Neosporin and doing a fairly good job. Wound measures 2.5 x 3.5 it does not have any depth he does not have a wound history in his legs outside of surgery however he does have chronic edema and skin changes suggestive of chronic venous disease possibly some degree of lymphedema as well. Past medical history includes type 2 diabetes with peripheral neuropathy and gait instability, lumbar spondylosis, obstructive sleep apnea, history of a left pontine CVA, basal cell skin cancer, atrial fibrillation on anticoagulation, significant PAD as noted with a left superficial femoral to posterior tibial bypass in February 2017 and a right popliteal atherectomy and angioplasty by Dr. Gwenlyn Found in 30th 2016. He also has a history of  coronary artery disease with an MI in 2002 hypertension hyperlipidemia and heart failure with preserved ejection fraction His last arterial studies I can see in epic were on 03/10/2018 this showed a right ABI of 0.69 and a right TBI of 0.5 with monophasic waveforms on the right. On the left his ABI was 1.20 with a TBI of 0.92 and triphasic waveforms. He has not had arterial studies since. Our nurse in the clinic got an ABI on the left of 1.1 11/2; left anterior leg wound in the setting of chronic venous insufficiency. Wound was initially trauma. We have been using Hydrofera Blue under compression he has home health.. The wound looks a lot better today with improvement in surface area 11/16; left anterior leg wound in the setting of chronic venous insufficiency. Wound was initially trauma. We have been using Hydrofera Blue under compression. The patient is closed today. He is supposed to follow-up with vein and vascular with regards to arterial insufficiency nevertheless his leg wounds are 12/3; apparently 2 weeks ago when they were putting on their stockings they managed to get 3 wounds on the left anterior lower leg from abrasion when putting on the stockings. Home health came by the week of Thanksgiving and put Hydrofera Blue 4-layer wrap on this and there is only one superficial area remaining. The patient and his wife complained about the difficulties getting stockings on I think we are using 20/30. We will order bilateral external compression stockings which should be easier. 12/10; wound on the left anterior lower leg is closed. He has chronic venous insufficiency we ordered him Farrow wrap stockings unfortunately he did not bring these in. Electronic Signature(s) Signed:  07/04/2020 8:09:40 AM By: Linton Ham MD Entered By: Linton Ham on 06/28/2020 12:52:58 -------------------------------------------------------------------------------- Physical Exam Details Patient Name: Date of  Service: Isabel Caprice, [place] A. 06/28/2020 10:00 A M Medical Record Number: [id number] Patient Account Number: [id number] Date of Birth/Sex: Treating RN: August 08, 1955 (64 y.o. Ernestene Mention Primary Care Provider: Merita Norton ULT, PRO [address] [employer] Other Clinician: Referring Provider: Treating Provider/Extender: Pleas Koch, Bindubal Weeks in Treatment: 6 Constitutional Patient is hypertensive.. Pulse regular and within target range for patient.[name] Respirations regular, non-labored and within target range.. Temperature is normal and within the target range for the patient.[name] Appears in no distress. Notes Wound exam; the area in question is on the left anterior lower extremity this is epithelialized. His edema control is adequate. Electronic Signature(s) Signed: 07/04/2020 8:09:40 AM By: Linton Ham MD Entered By: Linton Ham on 06/28/2020 12:53:30 -------------------------------------------------------------------------------- Physician Orders Details Patient Name: Date of Service: Isabel Caprice, [place] A. 06/28/2020 10:00 A M Medical Record Number: [id number] Patient Account Number: [id number] Date of Birth/Sex: Treating RN: 13-Jun-1956 (64 y.o. [name] Primary Care Provider: Merita Norton ULT, PRO V IDER Other Clinician: Referring Provider: Treating Provider/Extender: Pleas Koch, Bindubal Weeks in Treatment: 6 Verbal / Phone Orders: No Diagnosis Coding ICD-10 Coding Code Description S80.812D Abrasion, left lower leg, subsequent encounter L97.821 Non-pressure chronic ulcer of other part of left lower leg limited to breakdown of skin E11.51 Type 2 diabetes mellitus with diabetic peripheral angiopathy without gangrene I87.323 Chronic venous hypertension (idiopathic) with inflammation of bilateral lower extremity Discharge From [facility] Services Discharge from [place] Edema Control - Lymphedema / SCD / Other Elevate legs to the level of the heart or above for 30 minutes daily  and/or when sitting, a frequency of: - throughout the day Exercise regularly Moisturize legs daily. Compression stocking or Garment 20-30 mm/Hg pressure to: - Farrow wrap to both legs daily. Apply first thing in the morning, remove at night before bed. Electronic Signature(s) Signed: 07/02/2020 5:24:10 PM By: Levan Hurst RN, BSN Signed: 07/04/2020 8:09:40 AM By: Linton Ham MD Entered By: Levan Hurst on 06/28/2020 11:01:49 -------------------------------------------------------------------------------- Problem List Details Patient Name: Date of Service: Isabel Caprice, [place] A. 06/28/2020 10:00 A M Medical Record Number: [id number] Patient Account Number: [id number] Date of Birth/Sex: Treating RN: [birth year] (64 y.o. [name] Primary Care Provider: Merita Norton ULT, PRO V IDER Other Clinician: Referring Provider: Treating Provider/Extender: Pleas Koch, Bindubal Weeks in Treatment: 6 Active Problems ICD-10 Encounter Code Description Active Date MDM Diagnosis S80.812D Abrasion, left lower leg, subsequent encounter 05/14/2020 No Yes L97.821 Non-pressure chronic ulcer of other part of left lower leg limited to breakdown 05/14/2020 No Yes of skin E11.51 Type 2 diabetes mellitus with diabetic peripheral angiopathy without gangrene 05/14/2020 No Yes I87.323 Chronic venous hypertension (idiopathic) with inflammation of bilateral lower 05/14/2020 No Yes extremity Inactive Problems Resolved Problems Electronic Signature(s) Signed: 07/04/2020 8:09:40 AM By: Linton Ham MD Entered By: Linton Ham on 06/28/2020 12:52:00 -------------------------------------------------------------------------------- Progress Note Details Patient Name: Date of Service: Isabel Caprice, [place] A. 06/28/2020 10:00 A M Medical Record Number: [id number] Patient Account Number: [id number] Date of Birth/Sex: Treating RN: 1956-06-09 (64 y.o. Ernestene Mention Primary Care Provider: Merita Norton ULT, PRO Ave Filter  Other Clinician: Referring Provider: Treating Provider/Extender: Pleas Koch, Bindubal Weeks in Treatment: 6 Subjective History of Present Illness (HPI) ADMISSION 05/14/2020; this is a 64 year old man with multiple medical issues. Predominantly he has type 2 diabetes with a history of peripheral neuropathy and also history of fairly significant PAD.  He had a left superficial femoral to posterior tibial artery bypass in February 2017 he also had an atherectomy and angioplasty by Dr. Gwenlyn Found of the right popliteal artery in 2016. He is supposed to be getting arterial studies annually however this was interrupted last year because of the pandemic. He tells [place] he was at [facility] 2 weeks ago was getting out of of the scooter and traumatized his left lateral lower leg. There was a lot of bleeding as the patient is on Plavix and Eliquis. They have been dressing this with Neosporin and doing a fairly good job. Wound measures 2.5 x 3.5 it does not have any depth he does not have a wound history in his legs outside of surgery however he does have chronic edema and skin changes suggestive of chronic venous disease possibly some degree of lymphedema as well. Past medical history includes type 2 diabetes with peripheral neuropathy and gait instability, lumbar spondylosis, obstructive sleep apnea, history of a left pontine CVA, basal cell skin cancer, atrial fibrillation on anticoagulation, significant PAD as noted with a left superficial femoral to posterior tibial bypass in February 2017 and a right popliteal atherectomy and angioplasty by Dr. Gwenlyn Found in 30th 2016. He also has a history of coronary artery disease with an MI in 2002 hypertension hyperlipidemia and heart failure with preserved ejection fraction His last arterial studies I can see in epic were on 03/10/2018 this showed a right ABI of 0.69 and a right TBI of 0.5 with monophasic waveforms on the right. On the left his ABI was 1.20 with a TBI  of 0.92 and triphasic waveforms. He has not had arterial studies since. Our nurse in the clinic got an ABI on the left of 1.1 11/2; left anterior leg wound in the setting of chronic venous insufficiency. Wound was initially trauma. We have been using Hydrofera Blue under compression he has home health.. The wound looks a lot better today with improvement in surface area 11/16; left anterior leg wound in the setting of chronic venous insufficiency. Wound was initially trauma. We have been using Hydrofera Blue under compression. The patient is closed today. He is supposed to follow-up with vein and vascular with regards to arterial insufficiency nevertheless his leg wounds are 12/3; apparently 2 weeks ago when they were putting on their stockings they managed to get 3 wounds on the left anterior lower leg from abrasion when putting on the stockings. Home health came by the week of Thanksgiving and put Hydrofera Blue 4-layer wrap on this and there is only one superficial area remaining. The patient and his wife complained about the difficulties getting stockings on I think we are using 20/30. We will order bilateral external compression stockings which should be easier. 12/10; wound on the left anterior lower leg is closed. He has chronic venous insufficiency we ordered him Farrow wrap stockings unfortunately he did not bring these in. Objective Constitutional Patient is hypertensive.. Pulse regular and within target range for patient.[name] Respirations regular, non-labored and within target range.. Temperature is normal and within the target range for the patient.[name] Appears in no distress. Vitals Time Taken: 10:29 AM, Temperature: 98.4 F, Pulse: 74 bpm, Respiratory Rate: 18 breaths/min, Blood Pressure: 172/77 mmHg. General Notes: Wound exam; the area in question is on the left anterior lower extremity this is epithelialized. His edema control is adequate. Integumentary (Hair, Skin) Wound #1 status is  Healed - Epithelialized. Original cause of wound was Trauma. The wound is located on the Left,Lateral Lower Leg.  The wound measures 0cm length x 0cm width x 0cm depth; 0cm^2 area and 0cm^3 volume. There is no tunneling or undermining noted. There is a none present amount of drainage noted. There is no granulation within the wound bed. There is no necrotic tissue within the wound bed. Assessment Active Problems ICD-10 Abrasion, left lower leg, subsequent encounter Non-pressure chronic ulcer of other part of left lower leg limited to breakdown of skin Type 2 diabetes mellitus with diabetic peripheral angiopathy without gangrene Chronic venous hypertension (idiopathic) with inflammation of bilateral lower extremity Plan Discharge From [facility] Services: Discharge from [place] Edema Control - Lymphedema / SCD / Other: Elevate legs to the level of the heart or above for 30 minutes daily and/or when sitting, a frequency of: - throughout the day Exercise regularly Moisturize legs daily. Compression stocking or Garment 20-30 mm/Hg pressure to: - Farrow wrap to both legs daily. Apply first thing in the morning, remove at night before bed. 1. The patient can be discharged into his own Farrow wraps stockings. We went over the issues here including skin lubrication, consistent use of the stockings after he is done his toiletries in the morning etc. 2. Patient is discharged Electronic Signature(s) Signed: 07/04/2020 8:09:40 AM By: Linton Ham MD Entered By: Linton Ham on 06/28/2020 12:55:58 -------------------------------------------------------------------------------- SuperBill Details Patient Name: Date of Service: Vella Raring [place] A. 06/28/2020 Medical Record Number: [id number] Patient Account Number: [id number] Date of Birth/Sex: Treating RN: [birth year] (64 y.o. [name] Primary Care Provider: Merita Norton ULT, PRO V IDER Other Clinician: Referring Provider: Treating  Provider/Extender: Pleas Koch, Bindubal Weeks in Treatment: 6 Diagnosis Coding ICD-10 Codes Code Description S80.812D Abrasion, left lower leg, subsequent encounter L97.821 Non-pressure chronic ulcer of other part of left lower leg limited to breakdown of skin E11.51 Type 2 diabetes mellitus with diabetic peripheral angiopathy without gangrene I87.323 Chronic venous hypertension (idiopathic) with inflammation of bilateral lower extremity Facility Procedures CPT4 Code: [id number] Description: 99213 - WOUND CARE VISIT-LEV 3 EST PT Modifier: Quantity: 1 Physician Procedures : CPT4 Code Description Modifier [id number] - WC PHYS LEVEL 2 - EST PT ICD-10 Diagnosis Description S80.812D Abrasion, left lower leg, subsequent encounter L97.821 Non-pressure chronic ulcer of other part of left lower leg limited to breakdown of skin  I87.323 Chronic venous hypertension (idiopathic) with inflammation of bilateral lower extremity Quantity: 1 Electronic Signature(s) Signed: 07/04/2020 8:09:40 AM By: Linton Ham MD Entered By: Linton Ham on 06/28/2020 12:56:14

## 2020-07-04 NOTE — Progress Notes (Addendum)
[name], [name] ([id number]) Visit Report for 06/28/2020 Arrival Information Details Patient Name: Date of Service: SCHETTER, [place] A. 06/28/2020 10:00 A M Medical Record Number: [id number] Patient Account Number: [id number] Date of Birth/Sex: Treating RN: 09-07-1955 (64 y.o. Jerilynn Mages) Carlene Coria Primary Care Keyosha Tiedt: Merita Norton ULT, PRO V IDER Other Clinician: Referring Shadi Larner: Treating Kincade Granberg/Extender: Pleas Koch, Bindubal Weeks in Treatment: 6 Visit Information History Since Last Visit All ordered tests and consults were completed: No Patient Arrived: Ambulatory Added or deleted any medications: No Arrival Time: 10:28 Any new allergies or adverse reactions: No Accompanied By: wife Had a fall or experienced change in No Transfer Assistance: None activities of daily living that may affect Patient Identification Verified: Yes risk of falls: Secondary Verification Process Completed: Yes Signs or symptoms of abuse/neglect since last visito No Patient Requires Transmission-Based Precautions: No Hospitalized since last visit: No Patient Has Alerts: No Implantable device outside of the clinic excluding No cellular tissue based products placed in the center since last visit: Has Dressing in Place as Prescribed: Yes Has Compression in Place as Prescribed: Yes Pain Present Now: No Electronic Signature(s) Signed: 06/28/2020 2:11:24 PM By: Carlene Coria RN Entered By: Carlene Coria on 06/28/2020 10:29:17 -------------------------------------------------------------------------------- Clinic Level of Care Assessment Details Patient Name: Date of Service: HARD, [place] A. 06/28/2020 10:00 A M Medical Record Number: [id number] Patient Account Number: [id number] Date of Birth/Sex: Treating RN: June 06, 1956 (64 y.o. [name] Primary Care Annalisse Minkoff: Merita Norton ULT, PRO V IDER Other Clinician: Referring Caden Fatica: Treating Laverne Klugh/Extender: Pleas Koch, Bindubal Weeks in Treatment:  6 Clinic Level of Care Assessment Items TOOL 4 Quantity Score X- 1 0 Use when only an EandM is performed on FOLLOW-UP visit ASSESSMENTS - Nursing Assessment / Reassessment X- 1 10 Reassessment of Co-morbidities (includes updates in patient status) X- 1 5 Reassessment of Adherence to Treatment Plan ASSESSMENTS - Wound and Skin A ssessment / Reassessment X - Simple Wound Assessment / Reassessment - one wound 1 5 []  - 0 Complex Wound Assessment / Reassessment - multiple wounds []  - 0 Dermatologic / Skin Assessment (not related to wound area) ASSESSMENTS - Focused Assessment []  - 0 Circumferential Edema Measurements - multi extremities []  - 0 Nutritional Assessment / Counseling / Intervention X- 1 5 Lower Extremity Assessment (monofilament, tuning fork, pulses) []  - 0 Peripheral Arterial Disease Assessment (using hand held doppler) ASSESSMENTS - Ostomy and/or Continence Assessment and Care []  - 0 Incontinence Assessment and Management []  - 0 Ostomy Care Assessment and Management (repouching, etc.) PROCESS - Coordination of Care X - Simple Patient / Family Education for ongoing care 1 15 []  - 0 Complex (extensive) Patient / Family Education for ongoing care X- 1 10 Staff obtains Programmer, systems, Records, T Results / Process Orders est []  - 0 Staff telephones HHA, Nursing Homes / Clarify orders / etc []  - 0 Routine Transfer to another Facility (non-emergent condition) []  - 0 Routine Hospital Admission (non-emergent condition) []  - 0 New Admissions / Biomedical engineer / Ordering NPWT Apligraf, etc. , []  - 0 Emergency Hospital Admission (emergent condition) X- 1 10 Simple Discharge Coordination []  - 0 Complex (extensive) Discharge Coordination PROCESS - Special Needs []  - 0 Pediatric / Minor Patient Management []  - 0 Isolation Patient Management []  - 0 Hearing / Language / Visual special needs []  - 0 Assessment of Community assistance (transportation, D/C planning,  etc.) []  - 0 Additional assistance / Altered mentation []  - 0 Support Surface(s) Assessment (bed, cushion, seat, etc.) INTERVENTIONS - Wound  Cleansing / Measurement X - Simple Wound Cleansing - one wound 1 5 []  - 0 Complex Wound Cleansing - multiple wounds X- 1 5 Wound Imaging (photographs - any number of wounds) []  - 0 Wound Tracing (instead of photographs) X- 1 5 Simple Wound Measurement - one wound []  - 0 Complex Wound Measurement - multiple wounds INTERVENTIONS - Wound Dressings []  - 0 Small Wound Dressing one or multiple wounds []  - 0 Medium Wound Dressing one or multiple wounds []  - 0 Large Wound Dressing one or multiple wounds []  - 0 Application of Medications - topical []  - 0 Application of Medications - injection INTERVENTIONS - Miscellaneous []  - 0 External ear exam []  - 0 Specimen Collection (cultures, biopsies, blood, body fluids, etc.) []  - 0 Specimen(s) / Culture(s) sent or taken to Lab for analysis []  - 0 Patient Transfer (multiple staff / Civil Service fast streamer / Similar devices) []  - 0 Simple Staple / Suture removal (25 or less) []  - 0 Complex Staple / Suture removal (26 or more) []  - 0 Hypo / Hyperglycemic Management (close monitor of Blood Glucose) []  - 0 Ankle / Brachial Index (ABI) - do not check if billed separately X- 1 5 Vital Signs Has the patient been seen at the hospital within the last three years: Yes Total Score: 80 Level Of Care: New/Established - Level 3 Electronic Signature(s) Signed: 07/02/2020 5:24:10 PM By: Levan Hurst RN, BSN Entered By: Levan Hurst on 06/28/2020 12:40:26 -------------------------------------------------------------------------------- Lower Extremity Assessment Details Patient Name: Date of Service: Isabel Caprice, [place] A. 06/28/2020 10:00 A M Medical Record Number: [id number] Patient Account Number: [id number] Date of Birth/Sex: Treating RN: 18-Aug-1955 (64 y.o. Jerilynn Mages) Carlene Coria Primary Care Zilphia Kozinski: Merita Norton ULT, PRO V IDER  Other Clinician: Referring Anicka Stuckert: Treating Meila Berke/Extender: Pleas Koch, Bindubal Weeks in Treatment: 6 Edema Assessment Assessed: [Left: No] [Right: No] [Left: Edema] [Right: :] Calf Left: Right: Point of Measurement: From Medial Instep 39 cm 41.3 cm Ankle Left: Right: Point of Measurement: From Medial Instep 25 cm 28.4 cm Electronic Signature(s) Signed: 06/28/2020 2:11:24 PM By: Carlene Coria RN Entered By: Carlene Coria on 06/28/2020 10:35:58 -------------------------------------------------------------------------------- Multi Wound Chart Details Patient Name: Date of Service: Isabel Caprice, [place] A. 06/28/2020 10:00 A M Medical Record Number: [id number] Patient Account Number: [id number] Date of Birth/Sex: Treating RN: 1956/06/06 (64 y.o. Ernestene Mention Primary Care Uriel Dowding: Merita Norton ULT, PRO V IDER Other Clinician: Referring [name]: Treating Kortlyn Koltz/Extender: Pleas Koch, Bindubal Weeks in Treatment: 6 Vital Signs Height(in): Pulse(bpm): 74 Weight(lbs): Blood Pressure(mmHg): 172/77 Body Mass Index(BMI): Temperature(F): 98.4 Respiratory Rate(breaths/min): 18 Photos: [1:No Photos Left, Lateral Lower Leg] [N/A:N/A N/A] Wound Location: [1:Trauma] [N/A:N/A] Wounding Event: [1:Venous Leg Ulcer] [N/A:N/A] Primary Etiology: [1:Sleep Apnea, Arrhythmia, Coronary] [N/A:N/A] Comorbid History: [1:Artery Disease, Hypertension, Myocardial Infarction, Peripheral Arterial Disease, Type II Diabetes, Neuropathy 05/01/2019] [N/A:N/A] Date Acquired: [1:6] [N/A:N/A] Weeks of Treatment: [1:Healed - Epithelialized] [N/A:N/A] Wound Status: [1:0x0x0] [N/A:N/A] Measurements L x W x D (cm) [1:0] [N/A:N/A] A (cm) : rea [1:0] [N/A:N/A] Volume (cm) : [1:100.00%] [N/A:N/A] % Reduction in Area: [1:100.00%] [N/A:N/A] % Reduction in Volume: [1:Full Thickness Without Exposed] [N/A:N/A] Classification: [1:Support Structures None Present] [N/A:N/A] Exudate Amount: [1:None  Present (0%)] [N/A:N/A] Granulation Amount: [1:None Present (0%)] [N/A:N/A] Necrotic Amount: [1:Fascia: No] [N/A:N/A] Exposed Structures: [1:Fat Layer (Subcutaneous Tissue): No Tendon: No Muscle: No Joint: No Bone: No Large (67-100%)] [N/A:N/A] Treatment Notes Electronic Signature(s) Signed: 06/28/2020 5:15:15 PM By: Baruch Gouty RN, BSN Signed: 07/04/2020 8:09:40 AM By: Linton Ham MD Entered By: Linton Ham  on 06/28/2020 12:52:07 -------------------------------------------------------------------------------- [place] Details Patient Name: Date of Service: GAIL, [place] A. 06/28/2020 10:00 A M Medical Record Number: [id number] Patient Account Number: [id number] Date of Birth/Sex: Treating RN: [birth year] (64 y.o. [name] Primary Care Maysun Meditz: Merita Norton ULT, PRO V IDER Other Clinician: Referring Alexandru Moorer: Treating Saoirse Legere/Extender: Pleas Koch, Bindubal Weeks in Treatment: 6 Active Inactive Electronic Signature(s) Signed: 07/02/2020 5:24:10 PM By: Levan Hurst RN, BSN Entered By: Levan Hurst on 06/28/2020 12:39:40 -------------------------------------------------------------------------------- Pain Assessment Details Patient Name: Date of Service: Isabel Caprice, [place] A. 06/28/2020 10:00 A M Medical Record Number: [id number] Patient Account Number: [id number] Date of Birth/Sex: Treating RN: [birth year] (64 y.o. Jerilynn Mages) Carlene Coria Primary Care Harpreet Pompey: Merita Norton ULT, PRO V IDER Other Clinician: Referring Shanoah Asbill: Treating Hartley Urton/Extender: Pleas Koch, Bindubal Weeks in Treatment: 6 Active Problems Location of Pain Severity and Description of Pain Patient Has Paino No Site Locations Pain Management and Medication Current Pain Management: Electronic Signature(s) Signed: 06/28/2020 2:11:24 PM By: Carlene Coria RN Entered By: Carlene Coria on 06/28/2020  10:29:57 -------------------------------------------------------------------------------- Patient/Caregiver Education Details Patient Name: Date of Service: [place], [name] 12/10/2021andnbsp10:00 [place] Record Number: [id number] Patient Account Number: [id number] Date of Birth/Gender: Treating RN: 1956/04/09 (64 y.o. [name] Primary Care Physician: Merita Norton ULT, PRO V IDER Other Clinician: Referring Physician: Treating Physician/Extender: Pleas Koch, Loura Back in Treatment: 6 Education Assessment Education Provided To: Patient Education Topics Provided Wound/Skin Impairment: Methods: Explain/Verbal Responses: State content correctly Electronic Signature(s) Signed: 07/02/2020 5:24:10 PM By: Levan Hurst RN, BSN Entered By: Levan Hurst on 06/28/2020 12:39:51 -------------------------------------------------------------------------------- Wound Assessment Details Patient Name: Date of Service: Isabel Caprice, [place] RK A. 06/28/2020 10:00 A M Medical Record Number: [id number] Patient Account Number: [id number] Date of Birth/Sex: Treating RN: 19-Jul-1956 (64 y.o. Jerilynn Mages) Carlene Coria Primary Care Blaize Epple: Merita Norton ULT, PRO V IDER Other Clinician: Referring Angelika Jerrett: Treating Melaney Tellefsen/Extender: Pleas Koch, Bindubal Weeks in Treatment: 6 Wound Status Wound Number: 1 Primary Venous Leg Ulcer Etiology: Wound Location: Left, Lateral Lower Leg Wound Healed - Epithelialized Wounding Event: Trauma Status: Date Acquired: 05/01/2019 Comorbid Sleep Apnea, Arrhythmia, Coronary Artery Disease, Hypertension, Weeks Of Treatment: 6 History: Myocardial Infarction, Peripheral Arterial Disease, Type II Diabetes, Clustered Wound: No Neuropathy Wound Measurements Length: (cm) Width: (cm) Depth: (cm) Area: (cm) Volume: (cm) 0 % Reduction in Area: 100% 0 % Reduction in Volume: 100% 0 Epithelialization: Large (67-100%) 0 Tunneling: No 0 Undermining: No Wound  Description Classification: Full Thickness Without Exposed Support Structures Exudate Amount: None Present Foul Odor After Cleansing: No Slough/Fibrino No Wound Bed Granulation Amount: None Present (0%) Exposed Structure Necrotic Amount: None Present (0%) Fascia Exposed: No Fat Layer (Subcutaneous Tissue) Exposed: No Tendon Exposed: No Muscle Exposed: No Joint Exposed: No Bone Exposed: No Electronic Signature(s) Signed: 06/28/2020 2:11:24 PM By: Carlene Coria RN Signed: 07/02/2020 5:24:10 PM By: Levan Hurst RN, BSN Entered By: Levan Hurst on 06/28/2020 11:00:19 -------------------------------------------------------------------------------- [place] Details Patient Name: Date of Service: Isabel Caprice, [place] A. 06/28/2020 10:00 A M Medical Record Number: [id number] Patient Account Number: [id number] Date of Birth/Sex: Treating RN: 11-Jan-1956 (64 y.o. Jerilynn Mages) Carlene Coria Primary Care Daniel Johndrow: Merita Norton ULT, PRO V IDER Other Clinician: Referring Bowyn Mercier: Treating Jocelyn Nold/Extender: Pleas Koch, Bindubal Weeks in Treatment: 6 Vital Signs Time Taken: 10:29 Temperature (F): 98.4 Pulse (bpm): 74 Respiratory Rate (breaths/min): 18 Blood Pressure (mmHg): 172/77 Reference Range: 80 - 120 mg / dl Electronic Signature(s) Signed: 06/28/2020 2:11:24 PM By: Carlene Coria RN Entered By: Carlene Coria on 06/28/2020 10:29:44

## 2020-07-08 DIAGNOSIS — E1165 Type 2 diabetes mellitus with hyperglycemia: Secondary | ICD-10-CM | POA: Diagnosis not present

## 2020-07-08 DIAGNOSIS — E1121 Type 2 diabetes mellitus with diabetic nephropathy: Secondary | ICD-10-CM | POA: Diagnosis not present

## 2020-07-08 DIAGNOSIS — I10 Essential (primary) hypertension: Secondary | ICD-10-CM | POA: Diagnosis not present

## 2020-07-08 DIAGNOSIS — E1140 Type 2 diabetes mellitus with diabetic neuropathy, unspecified: Secondary | ICD-10-CM | POA: Diagnosis not present

## 2020-07-08 DIAGNOSIS — E7800 Pure hypercholesterolemia, unspecified: Secondary | ICD-10-CM | POA: Diagnosis not present

## 2020-07-13 DIAGNOSIS — G4733 Obstructive sleep apnea (adult) (pediatric): Secondary | ICD-10-CM | POA: Diagnosis not present

## 2020-07-14 ENCOUNTER — Other Ambulatory Visit: Payer: Self-pay | Admitting: Cardiovascular Disease

## 2020-07-22 DIAGNOSIS — I509 Heart failure, unspecified: Secondary | ICD-10-CM | POA: Diagnosis not present

## 2020-07-22 DIAGNOSIS — E1142 Type 2 diabetes mellitus with diabetic polyneuropathy: Secondary | ICD-10-CM | POA: Diagnosis not present

## 2020-07-22 DIAGNOSIS — I10 Essential (primary) hypertension: Secondary | ICD-10-CM | POA: Diagnosis not present

## 2020-07-22 DIAGNOSIS — I110 Hypertensive heart disease with heart failure: Secondary | ICD-10-CM | POA: Diagnosis not present

## 2020-07-22 DIAGNOSIS — G4733 Obstructive sleep apnea (adult) (pediatric): Secondary | ICD-10-CM | POA: Diagnosis not present

## 2020-07-22 DIAGNOSIS — I2510 Atherosclerotic heart disease of native coronary artery without angina pectoris: Secondary | ICD-10-CM | POA: Diagnosis not present

## 2020-07-22 DIAGNOSIS — E785 Hyperlipidemia, unspecified: Secondary | ICD-10-CM | POA: Diagnosis not present

## 2020-07-22 DIAGNOSIS — G8929 Other chronic pain: Secondary | ICD-10-CM | POA: Diagnosis not present

## 2020-07-22 DIAGNOSIS — E1151 Type 2 diabetes mellitus with diabetic peripheral angiopathy without gangrene: Secondary | ICD-10-CM | POA: Diagnosis not present

## 2020-07-22 DIAGNOSIS — I252 Old myocardial infarction: Secondary | ICD-10-CM | POA: Diagnosis not present

## 2020-07-22 DIAGNOSIS — Z008 Encounter for other general examination: Secondary | ICD-10-CM | POA: Diagnosis not present

## 2020-07-22 DIAGNOSIS — Z794 Long term (current) use of insulin: Secondary | ICD-10-CM | POA: Diagnosis not present

## 2020-07-22 DIAGNOSIS — I4891 Unspecified atrial fibrillation: Secondary | ICD-10-CM | POA: Diagnosis not present

## 2020-07-30 ENCOUNTER — Other Ambulatory Visit: Payer: Self-pay | Admitting: Cardiovascular Disease

## 2020-08-07 DIAGNOSIS — M545 Low back pain, unspecified: Secondary | ICD-10-CM | POA: Diagnosis not present

## 2020-08-12 ENCOUNTER — Telehealth: Payer: Self-pay | Admitting: Internal Medicine

## 2020-08-12 DIAGNOSIS — E785 Hyperlipidemia, unspecified: Secondary | ICD-10-CM

## 2020-08-12 NOTE — Telephone Encounter (Signed)
Lipid panel ordered MyChart message sent to patient as reminder to complete labs before next visit

## 2020-08-14 DIAGNOSIS — M5459 Other low back pain: Secondary | ICD-10-CM | POA: Diagnosis not present

## 2020-08-21 ENCOUNTER — Encounter: Payer: Self-pay | Admitting: Internal Medicine

## 2020-08-21 ENCOUNTER — Other Ambulatory Visit: Payer: Self-pay

## 2020-08-21 ENCOUNTER — Ambulatory Visit: Payer: Medicare HMO | Admitting: Internal Medicine

## 2020-08-21 VITALS — BP 160/70 | HR 55 | Ht 70.0 in | Wt 270.0 lb

## 2020-08-21 DIAGNOSIS — Z8673 Personal history of transient ischemic attack (TIA), and cerebral infarction without residual deficits: Secondary | ICD-10-CM

## 2020-08-21 DIAGNOSIS — I739 Peripheral vascular disease, unspecified: Secondary | ICD-10-CM | POA: Diagnosis not present

## 2020-08-21 DIAGNOSIS — E785 Hyperlipidemia, unspecified: Secondary | ICD-10-CM

## 2020-08-21 DIAGNOSIS — I2510 Atherosclerotic heart disease of native coronary artery without angina pectoris: Secondary | ICD-10-CM | POA: Diagnosis not present

## 2020-08-21 NOTE — Progress Notes (Signed)
LIPID CLINIC CONSULT NOTE  Chief Complaint:  Manage dyslipidemia  Primary Care Physician: Default, Provider, MD  Primary Cardiologist:  Quay Burow, MD  HPI:  [name] is a 65 y.o. male who is being seen today for the evaluation of Dyslipidemia at the request of Dr. Gwenlyn Found. This is a 73-year-old male patient of Dr. Alvester Chou with a history of coronary artery disease, hypertension, dyslipidemia, type 2 diabetes and prior stroke who presents for evaluation management of dyslipidemia.  Most recently his lipids were reassessed and remain uncontrolled.  His target LDL is less than 70.  Total cholesterol was 231, HDL 35 and direct LDL 87, triglycerides elevated 406.  He is currently on fenofibrate 145 mg daily as well as Vascepa 2 g twice daily.  He reports tolerating this without difficulty.  He also takes atorvastatin 40 mg daily.  08/21/2020  [name] is seen today in follow-up.  Overall he seems to be doing well.  He had lipids performed in December which showed an LDL of 104 and triglycerides of 200.  This does represent some improvement on his medications.  LDL however remains above target.  Given his history of coronary disease and stroke, aggressive medical therapy is recommended.  His target LDL is below 70.  He is LP(a) was negative at 9.6.  We discussed other possible options including PCSK9 inhibitors and inclisiran.  This was recently FDA approved however cost may be very expensive for him.  We discussed also the option of a clinical trial, Orion-4, which could provide his medications at no cost however he would potentially be randomized to placebo versus the drug  PMHx:  Past Medical History:  Diagnosis Date  . Arthritis    "hx; cleaned it out of both shoulders"  . CAD (coronary artery disease)    OV, Dr Harlow Asa, [facility] 5/12 on chart  EKG 10/12 EPIC,  chest x ray 01/07/11 EPIC  . Carpal tunnel syndrome    peripheral neuropathy  . Chronic shoulder pain    "both"  . DVT  (deep venous thrombosis) (HCC)    hx LLE  . History of kidney stones   . Hyperlipidemia   . Hypertension   . Myocardial infarction ([place]) 02/2001  . Neuropathy, peripheral    both feet  . Peripheral vascular disease, unspecified ([place]) 03/2015   PCI to the right popliteal  . Pseudobulbar affect   . Skin cancer    "have had them cut or burned off my face" (03/28/2015)  . Stroke ([place]) 10/10/2015  . Type II diabetes mellitus ([place])     Past Surgical History:  Procedure Laterality Date  . APPENDECTOMY  1977  . CARDIAC CATHETERIZATION  2002      . CARPAL TUNNEL RELEASE Right 2000's  . CARPAL TUNNEL RELEASE  12/18/2011   Procedure: CARPAL TUNNEL RELEASE;  Surgeon: Mcarthur Rossetti, MD;  Location: [facility];  Service: Orthopedics;  Laterality: Left;  Left Open Carpal Tunnel Release  . CORONARY ANGIOPLASTY    . CORONARY ARTERY BYPASS GRAFT  2002   CABG X 4  . CYSTOSCOPY  several done in past  . FEMORAL-TIBIAL BYPASS GRAFT Left 01/07/11   fem-posterior tibial BPG using reversed left GSV               12/15/11 OK BY DR Ninfa Linden TO CONTINUE ASA AND PLAVIX  . FEMORAL-TIBIAL BYPASS GRAFT Left 09/06/2015   Procedure: LEFT FEMORAL-POSTERIOR TIBIAL ARTERY BYPASS GRAFT WITH COMPOSITE PTFE AND RIGHT ARM VEIN;  Surgeon: Elam Dutch, MD;  Location: [facility];  Service: Vascular;  Laterality: Left;  . FEMOROPOPLITEAL THROMBECTOMY / EMBOLECTOMY  ~ 2010  . FRACTURE SURGERY    . IR ANGIO INTRA EXTRACRAN SEL COM CAROTID INNOMINATE BILAT MOD SED  03/02/2017  . IR ANGIO INTRA EXTRACRAN SEL COM CAROTID INNOMINATE BILAT MOD SED  04/23/2020  . IR ANGIO VERTEBRAL SEL SUBCLAVIAN INNOMINATE UNI R MOD SED  08/16/2018  . IR ANGIO VERTEBRAL SEL VERTEBRAL UNI L MOD SED  03/02/2017  . IR ANGIO VERTEBRAL SEL VERTEBRAL UNI L MOD SED  08/16/2018  . IR ANGIO VERTEBRAL SEL VERTEBRAL UNI L MOD SED  04/23/2020  . IR ANGIOGRAM EXTREMITY RIGHT  03/02/2017  . IR GENERIC HISTORICAL  02/18/2016   IR RADIOLOGIST EVAL & MGMT 02/18/2016  [facility]  . IR GENERIC HISTORICAL  07/30/2016   IR ANGIO VERTEBRAL SEL VERTEBRAL UNI L MOD SED 07/30/2016 Luanne Bras, MD [facility]  . IR GENERIC HISTORICAL  07/30/2016   IR ANGIO INTRA EXTRACRAN SEL COM CAROTID INNOMINATE BILAT MOD SED 07/30/2016 Luanne Bras, MD [facility]  . IR US GUIDE VASC ACCESS RIGHT  08/16/2018  . KNEE ARTHROSCOPY Left X 2  . LAPAROSCOPIC CHOLECYSTECTOMY  1990's  . LITHOTRIPSY  several done in past  . ORIF RADIUS & ULNA FRACTURES Left   . PERIPHERAL VASCULAR CATHETERIZATION N/A 03/28/2015   Procedure: Lower Extremity Angiography;  Surgeon: Lorretta Harp, MD;  Location: [facility];  Service: Cardiovascular;  Laterality: N/A;  . PERIPHERAL VASCULAR CATHETERIZATION Right 03/28/2015   Procedure: Peripheral Vascular Atherectomy;  Surgeon: Lorretta Harp, MD;  Location: [facility];  Service: Cardiovascular;  Laterality: Right;  popliteal;   . PERIPHERAL VASCULAR CATHETERIZATION N/A 08/23/2015   Procedure: Abdominal Aortogram;  Surgeon: Elam Dutch, MD;  Location: [facility];  Service: Cardiovascular;  Laterality: N/A;  . POPLITEAL ARTERY STENT Left 2010-2012 X 4  . RADIOLOGY WITH ANESTHESIA N/A 02/04/2016   Procedure: Basilar artery angioplasty with stenting;  Surgeon: Luanne Bras, MD;  Location: [facility];  Service: Radiology;  Laterality: N/A;  . SHOULDER ARTHROSCOPY Left   . SHOULDER ARTHROSCOPY Right 12/18/2011  . SHOULDER ARTHROSCOPY  07/08/2012   Procedure: ARTHROSCOPY SHOULDER;  Surgeon: Mcarthur Rossetti, MD;  Location[facility];  Service: Orthopedics;  Laterality: Left;  Left Shoulder Arthroscopy with Manipulation and Extensive Debridement  . SHOULDER ARTHROSCOPY WITH ROTATOR CUFF REPAIR Left 07/14/2013   Procedure: LEFT SHOULDER ARTHROSCOPY WITH EXTENSIVE DEBRIDEMENT, DISTAL CLAVICLE REPAIR;  Surgeon: Mcarthur Rossetti, MD;  [facility];  Service: Orthopedics;  Laterality: Left;  . SKIN CANCER EXCISION      "left side of my forehead"  . VEIN HARVEST Right 09/06/2015   Procedure: RIGHT ARM VEIN HARVEST;  Surgeon: Elam Dutch, MD;  Location: [facility];  Service: Vascular;  Laterality: Right;    FAMHx:  Family History  Problem Relation Age of Onset  . Cancer Mother        Breast and Brain tumor  . Cancer Father        Blood vessel tumor    SOCHx:   reports that he has never smoked. He has never used smokeless tobacco. He reports that he does not drink alcohol and does not use drugs.  ALLERGIES:  No Known Allergies  ROS: Pertinent items noted in HPI and remainder of comprehensive ROS otherwise negative.  HOME MEDS: Current Outpatient Medications on File Prior to Visit  Medication Sig  Dispense Refill  . atorvastatin (LIPITOR) 40 MG tablet Take 40 mg by mouth daily.    . BD INSULIN SYRINGE U/F 31G X 5/16" 1 ML MISC AS DIRECTED 3 TIMES A DAY SUBCUTANEOUS 30    . clopidogrel (PLAVIX) 75 MG tablet TAKE 1 TABLET BY MOUTH EVERY DAY 90 tablet 1  . ELIQUIS 5 MG TABS tablet TAKE 1 TABLET BY MOUTH TWICE A DAY 60 tablet 10  . empagliflozin (JARDIANCE) 10 MG TABS tablet Take 10 mg by mouth daily.    . fenofibrate (TRICOR) 145 MG tablet TAKE 1 TABLET (145 MG TOTAL) BY MOUTH DAILY. PLEASE SCHEDULE APPOINTMENT FOR REFILLS. 90 tablet 3  . furosemide (LASIX) 40 MG tablet TAKE 1 TABLET BY MOUTH TWICE A DAY (Patient taking differently: Take 40 mg by mouth 2 (two) times daily.) 180 tablet 2  . gabapentin (NEURONTIN) 300 MG capsule Take 300 mg by mouth 2 (two) times daily.     . insulin NPH-regular Human (70-30) 100 UNIT/ML injection Inject 65-75 Units into the skin See admin instructions. Inject 65-75 units with breakfast, 45-65 units at lunch and 75 units at dinner    . KLOR-CON M20 20 MEQ tablet TAKE 1 TABLET BY MOUTH TWICE A DAY (Patient taking differently: Take 20 mEq by mouth 2 (two) times daily.) 180 tablet 2  . metFORMIN (GLUCOPHAGE) 500 MG tablet Take 500 mg by mouth 2 (two) times daily.    .  metoprolol succinate (TOPROL-XL) 25 MG 24 hr tablet TAKE 1 TABLET (25 MG TOTAL) BY MOUTH DAILY. *DUE 9/17* 90 tablet 3  . ONETOUCH VERIO test strip AS DIRECTED 3 TIMES A DAY 30    . VASCEPA 1 g capsule TAKE 2 CAPSULES (2 G TOTAL) BY MOUTH 2 (TWO) TIMES A DAY. 120 capsule 3  . lisinopril (PRINIVIL,ZESTRIL) 20 MG tablet Take 1 tablet (20 mg total) by mouth daily. (Patient not taking: No sig reported) 30 tablet 6  . metFORMIN (GLUCOPHAGE-XR) 500 MG 24 hr tablet Take 1 tablet (500 mg total) by mouth 2 (two) times daily. (Patient not taking: No sig reported)    . metolazone (ZAROXOLYN) 2.5 MG tablet TAKE 1 TABLET ONCE A WEEK ON MONDAYS, MAY TAKE AN EXTRA TABLET IF WT REACHES 257LBS (Patient not taking: No sig reported) 15 tablet 3   No current facility-administered medications on file prior to visit.    LABS/IMAGING: No results found for this or any previous visit (from the past 48 hour(s)). No results found.  LIPID PANEL:    Component Value Date/Time   CHOL 183 10/04/2019 0857   TRIG 264 (H) 10/04/2019 0857   HDL 30 (L) 10/04/2019 0857   CHOLHDL 6.1 (H) 10/04/2019 0857   CHOLHDL NOT REPORTED DUE TO HIGH TRIGLYCERIDES 01/29/2016 0538   VLDL UNABLE TO CALCULATE IF TRIGLYCERIDE OVER 400 mg/dL 01/29/2016 0538   LDLCALC 107 (H) 10/04/2019 0857   LDLDIRECT 108 (H) 10/04/2019 0857   LDLDIRECT 87 01/16/2015 0813    WEIGHTS: Wt Readings from Last 3 Encounters:  08/21/20 270 lb (122.5 kg)  04/23/20 250 lb (113.4 kg)  09/29/19 275 lb (124.7 kg)    VITALS: BP (!) 160/70   Pulse (!) 55   Ht 5\' 10"  (1.778 m)   Wt 270 lb (122.5 kg)   SpO2 96%   BMI 38.74 kg/m   EXAM: Deferred  EKG: Deferred  ASSESSMENT: 1. Mixed hyperlipidemia, goal LDL less than 70 2. Coronary disease 3. Hypertension 4. Prior stroke 5. Type 2 diabetes  PLAN:  1.   [name] remains above target LDL less than 70.  Triglycerides have improved.  He is on a high potency statin, atorvastatin 40 mg daily and  fenofibrate as well as Vascepa 2 g twice daily.  He is a good candidate for PCSK9 inhibitor.  We discussed the commercially available options as well as a more recently available inclisiran.  Cost is a significant issue for him due to his current cost and income restrictions.  He may be a very good candidate for a clinical trial and we discussed the Orion-4 trial.  He says he is interested in this and I have reached out to our clinical research team to contact him.  Follow-up with me in 6 months.  Pixie Casino, MD, [facility], [place] Director of the Advanced Lipid Disorders &  Cardiovascular Risk Reduction Clinic Diplomate of the American Board of Clinical Lipidology Attending Cardiologist  Direct Dial: [phone]  Fax: [phone]  Website:  [name] 08/21/2020, 8:15 AM

## 2020-08-21 NOTE — Patient Instructions (Signed)
Medication Instructions:  Your physician recommends that you continue on your current medications as directed. Please refer to the Current Medication list given to you today.  *If you need a refill on your cardiac medications before your next appointment, please call your pharmacy*   Lab Work: FASTING lipid panel in 6 months to check cholesterol   If you have labs (blood work) drawn today and your tests are completely normal, you will receive your results only by: [name] MyChart Message (if you have MyChart) OR . A paper copy in the mail If you have any lab test that is abnormal or we need to change your treatment, we will call you to review the results.   Testing/Procedures: NONE   Follow-Up: At [facility], you and your health needs are our priority.  As part of our continuing mission to provide you with exceptional heart care, we have created designated Provider Care Teams.  These Care Teams include your primary Cardiologist (physician) and Advanced Practice Providers (APPs -  Physician Assistants and Nurse Practitioners) who all work together to provide you with the care you need, when you need it.  We recommend signing up for the patient portal called "MyChart".  Sign up information is provided on this After Visit Summary.  MyChart is used to connect with patients for Virtual Visits (Telemedicine).  Patients are able to view lab/test results, encounter notes, upcoming appointments, etc.  Non-urgent messages can be sent to your provider as well.   To learn more about what you can do with MyChart, go to [email or web address].    Your next appointment:   6 month(s)  The format for your next appointment:   In Person  Provider:   Dr. Lyman Bishop - lipid clinic   Other Instructions Dr. Debara Pickett is going to refer you for a clinical trial - [facility]

## 2020-08-28 ENCOUNTER — Encounter: Payer: Medicare HMO | Admitting: *Deleted

## 2020-08-28 ENCOUNTER — Other Ambulatory Visit: Payer: Self-pay

## 2020-08-28 DIAGNOSIS — Z006 Encounter for examination for normal comparison and control in clinical research program: Secondary | ICD-10-CM

## 2020-08-28 DIAGNOSIS — G4733 Obstructive sleep apnea (adult) (pediatric): Secondary | ICD-10-CM | POA: Diagnosis not present

## 2020-09-05 ENCOUNTER — Other Ambulatory Visit: Payer: Self-pay | Admitting: Internal Medicine

## 2020-09-06 NOTE — Research (Signed)
Pt to research clinic for [facility] 4 screening. Pt meet inclusion/exclusion for study.  POC total cholesterol done. Injection given in left side. Randomization appointment scheduled.

## 2020-09-06 NOTE — Research (Signed)
Subject Name: [name]  Subject met inclusion and exclusion criteria.  The informed consent form, study requirements and expectations were reviewed with the subject and questions and concerns were addressed prior to the signing of the consent form.  The subject verbalized understanding of the trial requirements.  The subject agreed to participate in the [facility] 4 trial and signed the informed consent at 1039 on 08/28/2020  The informed consent was obtained prior to performance of any protocol-specific procedures for the subject.  A copy of the signed informed consent was given to the subject and a copy was placed in the subject's medical record.   [name]

## 2020-09-10 DIAGNOSIS — E7800 Pure hypercholesterolemia, unspecified: Secondary | ICD-10-CM | POA: Diagnosis not present

## 2020-09-19 DIAGNOSIS — H25013 Cortical age-related cataract, bilateral: Secondary | ICD-10-CM | POA: Diagnosis not present

## 2020-09-19 DIAGNOSIS — E113293 Type 2 diabetes mellitus with mild nonproliferative diabetic retinopathy without macular edema, bilateral: Secondary | ICD-10-CM | POA: Diagnosis not present

## 2020-09-25 DIAGNOSIS — G4733 Obstructive sleep apnea (adult) (pediatric): Secondary | ICD-10-CM | POA: Diagnosis not present

## 2020-10-14 ENCOUNTER — Other Ambulatory Visit: Payer: Self-pay | Admitting: Internal Medicine

## 2020-10-22 ENCOUNTER — Other Ambulatory Visit: Payer: Self-pay

## 2020-10-22 ENCOUNTER — Encounter: Payer: Medicare HMO | Admitting: *Deleted

## 2020-10-22 DIAGNOSIS — Z006 Encounter for examination for normal comparison and control in clinical research program: Secondary | ICD-10-CM

## 2020-10-22 NOTE — Research (Signed)
Subject came into research clinic today for [facility] 4 Randomization Visit. Subject was successfully randomized into the [place] concomitant medications were reviewed and updated if applicable. There are no AE's or SAE's to report at this time. Subject received their subcutaneous IP injection in the right abdomen and tolerated well. Next appointment scheduled for Tuesday, July 19th, 2022 @ 10:00 am.

## 2020-10-26 DIAGNOSIS — G4733 Obstructive sleep apnea (adult) (pediatric): Secondary | ICD-10-CM | POA: Diagnosis not present

## 2020-11-16 ENCOUNTER — Other Ambulatory Visit: Payer: Self-pay | Admitting: Internal Medicine

## 2020-11-27 DIAGNOSIS — G4733 Obstructive sleep apnea (adult) (pediatric): Secondary | ICD-10-CM | POA: Diagnosis not present

## 2020-12-28 DIAGNOSIS — G4733 Obstructive sleep apnea (adult) (pediatric): Secondary | ICD-10-CM | POA: Diagnosis not present

## 2020-12-31 DIAGNOSIS — E1165 Type 2 diabetes mellitus with hyperglycemia: Secondary | ICD-10-CM | POA: Diagnosis not present

## 2020-12-31 DIAGNOSIS — E7801 Familial hypercholesterolemia: Secondary | ICD-10-CM | POA: Diagnosis not present

## 2021-01-07 DIAGNOSIS — E1121 Type 2 diabetes mellitus with diabetic nephropathy: Secondary | ICD-10-CM | POA: Diagnosis not present

## 2021-01-07 DIAGNOSIS — E7800 Pure hypercholesterolemia, unspecified: Secondary | ICD-10-CM | POA: Diagnosis not present

## 2021-01-07 DIAGNOSIS — I10 Essential (primary) hypertension: Secondary | ICD-10-CM | POA: Diagnosis not present

## 2021-01-07 DIAGNOSIS — E1140 Type 2 diabetes mellitus with diabetic neuropathy, unspecified: Secondary | ICD-10-CM | POA: Diagnosis not present

## 2021-01-07 DIAGNOSIS — E1165 Type 2 diabetes mellitus with hyperglycemia: Secondary | ICD-10-CM | POA: Diagnosis not present

## 2021-01-18 ENCOUNTER — Other Ambulatory Visit: Payer: Self-pay | Admitting: Internal Medicine

## 2021-01-18 ENCOUNTER — Other Ambulatory Visit: Payer: Self-pay | Admitting: Cardiovascular Disease

## 2021-01-21 ENCOUNTER — Other Ambulatory Visit: Payer: Self-pay

## 2021-01-21 MED ORDER — FENOFIBRATE 145 MG PO TABS
145.0000 mg | ORAL_TABLET | Freq: Every day | ORAL | 3 refills | Status: DC
Start: 2021-01-21 — End: 2021-12-08

## 2021-01-21 NOTE — Telephone Encounter (Signed)
31m, 122.5kg, scr 1.45 04/23/20, lovw/hilty 08/21/20

## 2021-01-25 ENCOUNTER — Other Ambulatory Visit: Payer: Self-pay | Admitting: Cardiovascular Disease

## 2021-01-27 DIAGNOSIS — G4733 Obstructive sleep apnea (adult) (pediatric): Secondary | ICD-10-CM | POA: Diagnosis not present

## 2021-02-05 ENCOUNTER — Encounter: Payer: Medicare HMO | Admitting: *Deleted

## 2021-02-05 ENCOUNTER — Other Ambulatory Visit: Payer: Self-pay

## 2021-02-05 DIAGNOSIS — Z006 Encounter for examination for normal comparison and control in clinical research program: Secondary | ICD-10-CM

## 2021-02-05 NOTE — Research (Addendum)
Subject came into the research clinic today for their Visit 3 in the [facility]. Subject's concomitant medications have been reviewed and updated if applicable. There are no new AE's or SAE's to report to sponsor at this time. The subject received their IP subcutaneously in their right abdomen and tolerated well. Next appointment scheduled for Monday, January 9th, 2023 @ 12:00 pm. Subject said they have a dentist appointment before this so they will be here after their dentist appointment.

## 2021-02-21 DIAGNOSIS — G4733 Obstructive sleep apnea (adult) (pediatric): Secondary | ICD-10-CM | POA: Diagnosis not present

## 2021-04-09 DIAGNOSIS — G4733 Obstructive sleep apnea (adult) (pediatric): Secondary | ICD-10-CM | POA: Diagnosis not present

## 2021-05-09 DIAGNOSIS — E1140 Type 2 diabetes mellitus with diabetic neuropathy, unspecified: Secondary | ICD-10-CM | POA: Diagnosis not present

## 2021-05-09 DIAGNOSIS — E1165 Type 2 diabetes mellitus with hyperglycemia: Secondary | ICD-10-CM | POA: Diagnosis not present

## 2021-05-09 DIAGNOSIS — I10 Essential (primary) hypertension: Secondary | ICD-10-CM | POA: Diagnosis not present

## 2021-05-09 DIAGNOSIS — G4733 Obstructive sleep apnea (adult) (pediatric): Secondary | ICD-10-CM | POA: Diagnosis not present

## 2021-05-09 DIAGNOSIS — E1121 Type 2 diabetes mellitus with diabetic nephropathy: Secondary | ICD-10-CM | POA: Diagnosis not present

## 2021-05-09 DIAGNOSIS — E7800 Pure hypercholesterolemia, unspecified: Secondary | ICD-10-CM | POA: Diagnosis not present

## 2021-05-17 ENCOUNTER — Other Ambulatory Visit: Payer: Self-pay | Admitting: Internal Medicine

## 2021-05-31 ENCOUNTER — Other Ambulatory Visit: Payer: Self-pay | Admitting: Internal Medicine

## 2021-06-02 ENCOUNTER — Encounter: Payer: Self-pay | Admitting: Cardiology

## 2021-06-02 NOTE — Progress Notes (Signed)
This encounter was created in error - please disregard.

## 2021-06-02 NOTE — Telephone Encounter (Signed)
Prescription refill request for Eliquis received. Indication: afib  Last office visit: 08/21/2020, [facility] Scr: 04/23/2020, 1.45 Age: 65 yo  Weight: 122 kg  Overdue for blood work. Called pt unable to get in touch with him.

## 2021-06-09 DIAGNOSIS — G4733 Obstructive sleep apnea (adult) (pediatric): Secondary | ICD-10-CM | POA: Diagnosis not present

## 2021-07-20 ENCOUNTER — Other Ambulatory Visit: Payer: Self-pay | Admitting: Internal Medicine

## 2021-07-22 ENCOUNTER — Other Ambulatory Visit: Payer: Self-pay | Admitting: Cardiovascular Disease

## 2021-07-28 ENCOUNTER — Telehealth: Payer: Self-pay

## 2021-07-29 NOTE — Telephone Encounter (Signed)
Patient had an appointment for [facility] 4 trial and called to cancel due to illness. Will reschedule when feeling better

## 2021-08-07 ENCOUNTER — Telehealth: Payer: Self-pay

## 2021-08-07 NOTE — Telephone Encounter (Signed)
Called to check in on patient since he was unable to come to his visit last week due to being sick. Trying to reschedule [facility] 4 visit

## 2021-08-12 ENCOUNTER — Other Ambulatory Visit: Payer: Self-pay | Admitting: Internal Medicine

## 2021-08-12 NOTE — Telephone Encounter (Signed)
Prescription refill request for Eliquis received. Indication: Afib  Last office visit: 08/21/20 ([facility])  Scr: 1.45 (04/23/20)  Age: 66 Weight: 122.5kg  Labs overdue. Called pt, no answer.

## 2021-08-13 ENCOUNTER — Other Ambulatory Visit: Payer: Self-pay

## 2021-08-13 DIAGNOSIS — Z006 Encounter for examination for normal comparison and control in clinical research program: Secondary | ICD-10-CM

## 2021-08-13 DIAGNOSIS — I480 Paroxysmal atrial fibrillation: Secondary | ICD-10-CM

## 2021-08-13 MED ORDER — APIXABAN 5 MG PO TABS: 5.0000 mg | ORAL_TABLET | Freq: Two times a day (BID) | ORAL | 0 refills | Status: DC

## 2021-08-13 NOTE — Research (Cosign Needed)
Patient seen in the clinic for Month 9 of the [facility]ION 4 trial. Denies any recent hospitalization, AEs and no medication changes. Labs were drawn per policy and procedure in protocol, without any complications. IP was given in the RUQ without any complaint. Month 22 follow up scheduled for 02/11/22 @ 0930.

## 2021-08-14 ENCOUNTER — Other Ambulatory Visit: Payer: Self-pay

## 2021-08-14 DIAGNOSIS — I480 Paroxysmal atrial fibrillation: Secondary | ICD-10-CM | POA: Diagnosis not present

## 2021-08-14 DIAGNOSIS — E785 Hyperlipidemia, unspecified: Secondary | ICD-10-CM

## 2021-08-14 LAB — COMPREHENSIVE METABOLIC PANEL
ALT: 11 IU/L (ref 0–44)
AST: 11 IU/L (ref 0–40)
Albumin/Globulin Ratio: 1.6 (ref 1.2–2.2)
Albumin: 4.5 g/dL (ref 3.8–4.8)
Alkaline Phosphatase: 59 IU/L (ref 44–121)
BUN/Creatinine Ratio: 9 — ABNORMAL LOW (ref 10–24)
BUN: 14 mg/dL (ref 8–27)
Bilirubin Total: 0.5 mg/dL (ref 0.0–1.2)
CO2: 29 mmol/L (ref 20–29)
Calcium: 10 mg/dL (ref 8.6–10.2)
Chloride: 105 mmol/L (ref 96–106)
Creatinine, Ser: 1.5 mg/dL — ABNORMAL HIGH (ref 0.76–1.27)
Globulin, Total: 2.8 g/dL (ref 1.5–4.5)
Glucose: 110 mg/dL — ABNORMAL HIGH (ref 70–99)
Potassium: 5.3 mmol/L — ABNORMAL HIGH (ref 3.5–5.2)
Sodium: 148 mmol/L — ABNORMAL HIGH (ref 134–144)
Total Protein: 7.3 g/dL (ref 6.0–8.5)
eGFR: 51 mL/min/{1.73_m2} — ABNORMAL LOW (ref >59)

## 2021-08-14 LAB — CBC
Hematocrit: 43.8 % (ref 37.5–51.0)
Hemoglobin: 14.3 g/dL (ref 13.0–17.7)
MCH: 28.3 pg (ref 26.6–33.0)
MCHC: 32.6 g/dL (ref 31.5–35.7)
MCV: 87 fL (ref 79–97)
Platelets: 295 10*3/uL (ref 150–450)
RBC: 5.06 x10E6/uL (ref 4.14–5.80)
RDW: 13.6 % (ref 11.6–15.4)
WBC: 8.8 10*3/uL (ref 3.4–10.8)

## 2021-08-14 LAB — LIPID PANEL
Chol/HDL Ratio: 4.7 ratio (ref 0.0–5.0)
Cholesterol, Total: 150 mg/dL (ref 100–199)
HDL: 32 mg/dL — ABNORMAL LOW (ref >39)
LDL Chol Calc (NIH): 83 mg/dL (ref 0–99)
Triglycerides: 208 mg/dL — ABNORMAL HIGH (ref 0–149)
VLDL Cholesterol Cal: 35 mg/dL (ref 5–40)

## 2021-08-29 ENCOUNTER — Other Ambulatory Visit: Payer: Self-pay | Admitting: Internal Medicine

## 2021-08-29 NOTE — Telephone Encounter (Signed)
Prescription refill request for Eliquis received. Indication:Afib Last office visit:Needs Appointment Scr:1.5 Age: 66 Weight:122.5 kg  Prescription refilled

## 2021-09-08 ENCOUNTER — Telehealth: Payer: Self-pay | Admitting: Internal Medicine

## 2021-09-08 NOTE — Telephone Encounter (Signed)
Called patient, advised to bring the form to the office, we would notify MD primary nurse to be on the look out.   Thanks!

## 2021-09-08 NOTE — Telephone Encounter (Signed)
New Message:      Patient wants to come tomorrow(09-09-21) to get his Hand sticker form filled out please.r

## 2021-09-10 NOTE — Telephone Encounter (Signed)
Handicap form completed/signed by [facility] MD  Left message for patient to call back - does he want this mailed or come by to pick up at office?

## 2021-09-10 NOTE — Telephone Encounter (Signed)
Handicap placard form to be mailed

## 2021-09-10 NOTE — Telephone Encounter (Signed)
Follow Up:     Patient said to please mail to him.

## 2021-09-11 ENCOUNTER — Other Ambulatory Visit: Payer: Self-pay

## 2021-09-11 ENCOUNTER — Other Ambulatory Visit: Payer: Self-pay | Admitting: Internal Medicine

## 2021-09-11 MED ORDER — METOPROLOL SUCCINATE ER 25 MG PO TB24: ORAL_TABLET | ORAL | 0 refills | Status: DC

## 2021-10-02 DIAGNOSIS — Z03818 Encounter for observation for suspected exposure to other biological agents ruled out: Secondary | ICD-10-CM | POA: Diagnosis not present

## 2021-10-02 DIAGNOSIS — J189 Pneumonia, unspecified organism: Secondary | ICD-10-CM | POA: Diagnosis not present

## 2021-10-06 DIAGNOSIS — G4733 Obstructive sleep apnea (adult) (pediatric): Secondary | ICD-10-CM | POA: Diagnosis not present

## 2021-10-07 DIAGNOSIS — Z006 Encounter for examination for normal comparison and control in clinical research program: Secondary | ICD-10-CM

## 2021-10-07 MED ORDER — STUDY - ORION 4 - INCLISIRAN 300 MG/1.5 ML OR PLACEBO SQ INJECTION (PI-STUCKEY): 300.0000 mg | INJECTION | SUBCUTANEOUS | Status: DC

## 2021-10-14 DIAGNOSIS — E1142 Type 2 diabetes mellitus with diabetic polyneuropathy: Secondary | ICD-10-CM | POA: Diagnosis not present

## 2021-10-14 DIAGNOSIS — F32A Depression, unspecified: Secondary | ICD-10-CM | POA: Diagnosis not present

## 2021-10-14 DIAGNOSIS — E1136 Type 2 diabetes mellitus with diabetic cataract: Secondary | ICD-10-CM | POA: Diagnosis not present

## 2021-10-14 DIAGNOSIS — E1165 Type 2 diabetes mellitus with hyperglycemia: Secondary | ICD-10-CM | POA: Diagnosis not present

## 2021-10-14 DIAGNOSIS — I110 Hypertensive heart disease with heart failure: Secondary | ICD-10-CM | POA: Diagnosis not present

## 2021-10-14 DIAGNOSIS — E785 Hyperlipidemia, unspecified: Secondary | ICD-10-CM | POA: Diagnosis not present

## 2021-10-14 DIAGNOSIS — Z794 Long term (current) use of insulin: Secondary | ICD-10-CM | POA: Diagnosis not present

## 2021-10-14 DIAGNOSIS — E1151 Type 2 diabetes mellitus with diabetic peripheral angiopathy without gangrene: Secondary | ICD-10-CM | POA: Diagnosis not present

## 2021-10-14 DIAGNOSIS — I4891 Unspecified atrial fibrillation: Secondary | ICD-10-CM | POA: Diagnosis not present

## 2021-10-14 DIAGNOSIS — I509 Heart failure, unspecified: Secondary | ICD-10-CM | POA: Diagnosis not present

## 2021-10-14 DIAGNOSIS — D6869 Other thrombophilia: Secondary | ICD-10-CM | POA: Diagnosis not present

## 2021-10-16 DIAGNOSIS — Z006 Encounter for examination for normal comparison and control in clinical research program: Secondary | ICD-10-CM

## 2021-10-16 MED ORDER — STUDY - ORION 4 - INCLISIRAN 300 MG/1.5 ML OR PLACEBO SQ INJECTION (PI-STUCKEY): 300.0000 mg | INJECTION | SUBCUTANEOUS | 1 refills | Status: AC

## 2021-10-31 DIAGNOSIS — E7800 Pure hypercholesterolemia, unspecified: Secondary | ICD-10-CM | POA: Diagnosis not present

## 2021-10-31 DIAGNOSIS — E1165 Type 2 diabetes mellitus with hyperglycemia: Secondary | ICD-10-CM | POA: Diagnosis not present

## 2021-11-07 DIAGNOSIS — E7800 Pure hypercholesterolemia, unspecified: Secondary | ICD-10-CM | POA: Diagnosis not present

## 2021-11-07 DIAGNOSIS — I2510 Atherosclerotic heart disease of native coronary artery without angina pectoris: Secondary | ICD-10-CM | POA: Diagnosis not present

## 2021-11-07 DIAGNOSIS — E1121 Type 2 diabetes mellitus with diabetic nephropathy: Secondary | ICD-10-CM | POA: Diagnosis not present

## 2021-11-07 DIAGNOSIS — E1140 Type 2 diabetes mellitus with diabetic neuropathy, unspecified: Secondary | ICD-10-CM | POA: Diagnosis not present

## 2021-11-07 DIAGNOSIS — E1165 Type 2 diabetes mellitus with hyperglycemia: Secondary | ICD-10-CM | POA: Diagnosis not present

## 2021-11-07 DIAGNOSIS — I10 Essential (primary) hypertension: Secondary | ICD-10-CM | POA: Diagnosis not present

## 2021-12-04 ENCOUNTER — Telehealth: Payer: Self-pay

## 2021-12-04 NOTE — Telephone Encounter (Signed)
Pt called stating that [name] recommended Dr. Nani Ravens to him to establish care as a NP. Advised pt that Dr. Nani Ravens would have to approve taking him on as a pt. Please Advise.

## 2021-12-04 NOTE — Telephone Encounter (Signed)
OK 

## 2021-12-06 ENCOUNTER — Other Ambulatory Visit: Payer: Self-pay | Admitting: Internal Medicine

## 2021-12-06 ENCOUNTER — Other Ambulatory Visit: Payer: Self-pay | Admitting: Cardiovascular Disease

## 2021-12-08 DIAGNOSIS — M47816 Spondylosis without myelopathy or radiculopathy, lumbar region: Secondary | ICD-10-CM | POA: Diagnosis not present

## 2021-12-17 ENCOUNTER — Ambulatory Visit (INDEPENDENT_AMBULATORY_CARE_PROVIDER_SITE_OTHER): Payer: Medicare HMO | Admitting: Family Medicine

## 2021-12-17 ENCOUNTER — Encounter: Payer: Self-pay | Admitting: Family Medicine

## 2021-12-17 VITALS — BP 140/82 | HR 49 | Temp 98.2°F | Ht 70.0 in | Wt 272.2 lb

## 2021-12-17 DIAGNOSIS — E781 Pure hyperglyceridemia: Secondary | ICD-10-CM

## 2021-12-17 DIAGNOSIS — Z23 Encounter for immunization: Secondary | ICD-10-CM | POA: Diagnosis not present

## 2021-12-17 DIAGNOSIS — Z0000 Encounter for general adult medical examination without abnormal findings: Secondary | ICD-10-CM

## 2021-12-17 DIAGNOSIS — Z1211 Encounter for screening for malignant neoplasm of colon: Secondary | ICD-10-CM

## 2021-12-17 DIAGNOSIS — Z1159 Encounter for screening for other viral diseases: Secondary | ICD-10-CM | POA: Diagnosis not present

## 2021-12-17 LAB — COMPREHENSIVE METABOLIC PANEL
ALT: 9 U/L (ref 0–53)
AST: 13 U/L (ref 0–37)
Albumin: 4.2 g/dL (ref 3.5–5.2)
Alkaline Phosphatase: 52 U/L (ref 39–117)
BUN: 22 mg/dL (ref 6–23)
CO2: 30 mEq/L (ref 19–32)
Calcium: 9.5 mg/dL (ref 8.4–10.5)
Chloride: 101 mEq/L (ref 96–112)
Creatinine, Ser: 1.57 mg/dL — ABNORMAL HIGH (ref 0.40–1.50)
GFR: 45.34 mL/min — ABNORMAL LOW (ref >60.00)
Glucose, Bld: 276 mg/dL — ABNORMAL HIGH (ref 70–99)
Potassium: 4.3 mEq/L (ref 3.5–5.1)
Sodium: 141 mEq/L (ref 135–145)
Total Bilirubin: 0.8 mg/dL (ref 0.2–1.2)
Total Protein: 7 g/dL (ref 6.0–8.3)

## 2021-12-17 NOTE — Addendum Note (Signed)
Addended by: Sharon Seller B on: 12/17/2021 09:41 AM   Modules accepted: Orders
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
## 2021-12-17 NOTE — Patient Instructions (Addendum)
Give [place] 2-3 business days to get the results of your labs back.   Keep the diet clean and stay active.  Please get me a copy of your advanced directive form at your convenience.   If you do not hear anything about your referral in the next 1-2 weeks, call our office and ask for an update.  Let us know if you need anything.

## 2021-12-18 LAB — HEPATITIS C ANTIBODY
Hepatitis C Ab: NONREACTIVE
SIGNAL TO CUT-OFF: 0.04 (ref <1.00)

## 2021-12-25 DIAGNOSIS — M47816 Spondylosis without myelopathy or radiculopathy, lumbar region: Secondary | ICD-10-CM | POA: Diagnosis not present

## 2021-12-30 ENCOUNTER — Other Ambulatory Visit: Payer: Self-pay | Admitting: Internal Medicine

## 2022-01-16 ENCOUNTER — Ambulatory Visit (HOSPITAL_BASED_OUTPATIENT_CLINIC_OR_DEPARTMENT_OTHER): Payer: Medicare HMO | Admitting: Internal Medicine

## 2022-01-16 ENCOUNTER — Encounter (HOSPITAL_BASED_OUTPATIENT_CLINIC_OR_DEPARTMENT_OTHER): Payer: Self-pay | Admitting: Internal Medicine

## 2022-01-16 VITALS — BP 140/76 | HR 66 | Ht 71.0 in | Wt 278.6 lb

## 2022-01-16 DIAGNOSIS — Z006 Encounter for examination for normal comparison and control in clinical research program: Secondary | ICD-10-CM

## 2022-01-16 DIAGNOSIS — I2510 Atherosclerotic heart disease of native coronary artery without angina pectoris: Secondary | ICD-10-CM

## 2022-01-16 DIAGNOSIS — E785 Hyperlipidemia, unspecified: Secondary | ICD-10-CM

## 2022-01-16 DIAGNOSIS — I739 Peripheral vascular disease, unspecified: Secondary | ICD-10-CM | POA: Diagnosis not present

## 2022-01-16 DIAGNOSIS — Z8673 Personal history of transient ischemic attack (TIA), and cerebral infarction without residual deficits: Secondary | ICD-10-CM

## 2022-01-16 NOTE — Progress Notes (Signed)
LIPID CLINIC CONSULT NOTE  Chief Complaint:  Follow-up dyslipidemia  Primary Care Physician: Shelda Pal, DO  Primary Cardiologist:  Quay Burow, MD  HPI:  [name] is a 66 y.o. male who is being seen today for the evaluation of Dyslipidemia at the request of Dr. Gwenlyn Found. This is a 34-year-old male patient of Dr. Gwenlyn Found with a history of coronary artery disease, hypertension, dyslipidemia, type 2 diabetes and prior stroke who presents for evaluation management of dyslipidemia.  Most recently his lipids were reassessed and remain uncontrolled.  His target LDL is less than 70.  Total cholesterol was 231, HDL 35 and direct LDL 87, triglycerides elevated 406.  He is currently on fenofibrate 145 mg daily as well as Vascepa 2 g twice daily.  He reports tolerating this without difficulty.  He also takes atorvastatin 40 mg daily.  08/21/2020  [name] is seen today in follow-up.  Overall he seems to be doing well.  He had lipids performed in December which showed an LDL of 104 and triglycerides of 200.  This does represent some improvement on his medications.  LDL however remains above target.  Given his history of coronary disease and stroke, aggressive medical therapy is recommended.  His target LDL is below 70.  He is LP(a) was negative at 9.6.  We discussed other possible options including PCSK9 inhibitors and inclisiran.  This was recently FDA approved however cost may be very expensive for him.  We discussed also the option of a clinical trial, Orion-4, which could provide his medications at no cost however he would potentially be randomized to placebo versus the drug  01/16/2022  [name] returns today for follow-up.  He continues to be enrolled in the [place] research trial.  In addition he is on Vascepa and fenofibrate.  He also takes atorvastatin 40 mg daily.  He is blinded to his lipids.  The PI of the study is Dr. Lia Foyer.  He has had some issues with cost of medications  including the Vascepa, Eliquis and Jardiance which is prescribed by his primary care provider.  He is asking to see if there is some patient assistance available.  PMHx:  Past Medical History:  Diagnosis Date   Arthritis    "hx; cleaned it out of both shoulders"   CAD (coronary artery disease)    OV, Dr Harlow Asa, [facility] 5/12 on chart  EKG 10/12 EPIC,  chest x ray 01/07/11 EPIC   Carpal tunnel syndrome    peripheral neuropathy   Chronic shoulder pain    "both"   Diabetes mellitus type 2 in obese ([facility])    sees endo   DVT (deep venous thrombosis) (H[facility])    hx LLE   History of kidney stones    Hyperlipidemia    Hypertension    Myocardial infarction ([place]) 02/2001   Neuropathy, peripheral    both feet   Peripheral vascular disease, unspecified ([place]) 03/2015   PCI to the right popliteal   Pseudobulbar affect    Skin cancer    "have had them cut or burned off my face" (03/28/2015)   Stroke ([place]) 10/10/2015   Type II diabetes mellitus ([place])     Past Surgical History:  Procedure Laterality Date   APPENDECTOMY  1977   CARDIAC CATHETERIZATION  2002       CARPAL TUNNEL RELEASE Right 2000's   CARPAL TUNNEL RELEASE  12/18/2011   Procedure: CARPAL TUNNEL RELEASE;  Surgeon: Mcarthur Rossetti, MD;  Location:  [facility];  Service: Orthopedics;  Laterality: Left;  Left Open Carpal Tunnel Release   CORONARY ANGIOPLASTY     CORONARY ARTERY BYPASS GRAFT  2002   CABG X 4   CYSTOSCOPY  several done in past   FEMORAL-TIBIAL BYPASS GRAFT Left 01/07/11   fem-posterior tibial BPG using reversed left GSV               12/15/11 OK BY DR Ninfa Linden TO CONTINUE ASA AND PLAVIX   FEMORAL-TIBIAL BYPASS GRAFT Left 09/06/2015   Procedure: LEFT FEMORAL-POSTERIOR TIBIAL ARTERY BYPASS GRAFT WITH COMPOSITE PTFE AND RIGHT ARM VEIN;  Surgeon: Elam Dutch, MD;  Location: [facility];  Service: Vascular;  Laterality: Left;   FEMOROPOPLITEAL THROMBECTOMY / EMBOLECTOMY  ~ 2010   FRACTURE SURGERY     IR ANGIO INTRA  EXTRACRAN SEL COM CAROTID INNOMINATE BILAT MOD SED  03/02/2017   IR ANGIO INTRA EXTRACRAN SEL COM CAROTID INNOMINATE BILAT MOD SED  04/23/2020   IR ANGIO VERTEBRAL SEL SUBCLAVIAN INNOMINATE UNI R MOD SED  08/16/2018   IR ANGIO VERTEBRAL SEL VERTEBRAL UNI L MOD SED  03/02/2017   IR ANGIO VERTEBRAL SEL VERTEBRAL UNI L MOD SED  08/16/2018   IR ANGIO VERTEBRAL SEL VERTEBRAL UNI L MOD SED  04/23/2020   IR ANGIOGRAM EXTREMITY RIGHT  03/02/2017   IR GENERIC HISTORICAL  02/18/2016   IR RADIOLOGIST EVAL & MGMT 02/18/2016 [facility]   IR GENERIC HISTORICAL  07/30/2016   IR ANGIO VERTEBRAL SEL VERTEBRAL UNI L MOD SED 07/30/2016 Luanne Bras, MD [facility]   IR GENERIC HISTORICAL  07/30/2016   IR ANGIO INTRA EXTRACRAN SEL COM CAROTID INNOMINATE BILAT MOD SED 07/30/2016 Luanne Bras, MD [facility]   IR US GUIDE VASC ACCESS RIGHT  08/16/2018   KNEE ARTHROSCOPY Left X 2   LAPAROSCOPIC CHOLECYSTECTOMY  1990's   LITHOTRIPSY  several done in past   ORIF RADIUS & ULNA FRACTURES Left    PERIPHERAL VASCULAR CATHETERIZATION N/A 03/28/2015   Procedure: Lower Extremity Angiography;  Surgeon: Lorretta Harp, MD;  Location: [facility];  Service: Cardiovascular;  Laterality: N/A;   PERIPHERAL VASCULAR CATHETERIZATION Right 03/28/2015   Procedure: Peripheral Vascular Atherectomy;  Surgeon: Lorretta Harp, MD;  Location: [facility];  Service: Cardiovascular;  Laterality: Right;  popliteal;    PERIPHERAL VASCULAR CATHETERIZATION N/A 08/23/2015   Procedure: Abdominal Aortogram;  Surgeon: Elam Dutch, MD;  Location: [facility];  Service: Cardiovascular;  Laterality: N/A;   POPLITEAL ARTERY STENT Left 2010-2012 X 4   RADIOLOGY WITH ANESTHESIA N/A 02/04/2016   Procedure: Basilar artery angioplasty with stenting;  Surgeon: Luanne Bras, MD;  Location: [facility];  Service: Radiology;  Laterality: N/A;   SHOULDER ARTHROSCOPY Left    SHOULDER ARTHROSCOPY Right 12/18/2011   SHOULDER ARTHROSCOPY   07/08/2012   Procedure: ARTHROSCOPY SHOULDER;  Surgeon: Mcarthur Rossetti, MD;  Location: [facility];  Service: Orthopedics;  Laterality: Left;  Left Shoulder Arthroscopy with Manipulation and Extensive Debridement   SHOULDER ARTHROSCOPY WITH ROTATOR CUFF REPAIR Left 07/14/2013   Procedure: LEFT SHOULDER ARTHROSCOPY WITH EXTENSIVE DEBRIDEMENT, DISTAL CLAVICLE REPAIR;  Surgeon: Mcarthur Rossetti, MD;  [facility];  Service: Orthopedics;  Laterality: Left;   SKIN CANCER EXCISION     "left side of my forehead"   VEIN HARVEST Right 09/06/2015   Procedure: RIGHT ARM VEIN HARVEST;  Surgeon: Elam Dutch, MD;  Location: [facility];  Service: Vascular;  Laterality: Right;  FAMHx:  Family History  Problem Relation Age of Onset   Cancer Mother        Breast and Brain tumor   Cancer Father        Blood vessel tumor    SOCHx:   reports that he has never smoked. He has never used smokeless tobacco. He reports that he does not drink alcohol and does not use drugs.  ALLERGIES:  No Known Allergies  ROS: Pertinent items noted in HPI and remainder of comprehensive ROS otherwise negative.  HOME MEDS: Current Outpatient Medications on File Prior to Visit  Medication Sig Dispense Refill   apixaban (ELIQUIS) 5 MG TABS tablet TAKE 1 TABLET BY MOUTH TWICE A DAY 60 tablet 3   atorvastatin (LIPITOR) 40 MG tablet Take 40 mg by mouth daily.     BD INSULIN SYRINGE U/F 31G X 5/16" 1 ML MISC AS DIRECTED 3 TIMES A DAY SUBCUTANEOUS 30     clopidogrel (PLAVIX) 75 MG tablet TAKE 1 TABLET BY MOUTH EVERY DAY 90 tablet 3   empagliflozin (JARDIANCE) 10 MG TABS tablet Take 10 mg by mouth daily.     fenofibrate (TRICOR) 145 MG tablet Take 1 tablet (145 mg total) by mouth daily. Keep scheduled appointment for further refills 60 tablet 0   furosemide (LASIX) 40 MG tablet Take 1 tablet (40 mg total) by mouth 2 (two) times daily. Keep scheduled appointment for further refills 120 tablet 0   gabapentin  (NEURONTIN) 300 MG capsule Take 300 mg by mouth 2 (two) times daily.      insulin NPH-regular Human (70-30) 100 UNIT/ML injection Inject 65-75 Units into the skin See admin instructions. Inject 65-75 units with breakfast, 45-65 units at lunch and 75 units at dinner     KLOR-CON M20 20 MEQ tablet TAKE 1 TABLET BY MOUTH TWICE A DAY 180 tablet 2   lisinopril (ZESTRIL) 20 MG tablet 1 tablet     metFORMIN (GLUCOPHAGE-XR) 500 MG 24 hr tablet Take 1 tablet (500 mg total) by mouth 2 (two) times daily.     metoprolol succinate (TOPROL-XL) 25 MG 24 hr tablet TAKE 1 TABLET (25 MG TOTAL) BY MOUTH DAILY. Keep scheduled appointment for further refills 60 tablet 0   ONETOUCH VERIO test strip AS DIRECTED 3 TIMES A DAY 30     Study - ORION 4 - inclisiran 300 mg/1.42m or placebo SQ injection (PI-Stuckey) Inject 1.5 mLs (300 mg total) into the skin every 6 (six) months. 1 mL 1   VASCEPA 1 g capsule TAKE 2 CAPSULES (2 G TOTAL) BY MOUTH 2 (TWO) TIMES DAILY. PLEASE KEEP SCHEDULED APPOINTMENT FOR FURTHER REFILLS (Patient not taking: Reported on 01/16/2022) 120 capsule 2   Current Facility-Administered Medications on File Prior to Visit  Medication Dose Route Frequency Provider Last Rate Last Admin   Study - ORION 4 - inclisiran 300 mg/1.567mor placebo SQ injection (PI-Stuckey)  300 mg Subcutaneous Q6 months StHillary BowMD        LABS/IMAGING: No results found for this or any previous visit (from the past 48 hour(s)). No results found.  LIPID PANEL:    Component Value Date/Time   CHOL 150 08/14/2021 1018   TRIG 208 (H) 08/14/2021 1018   HDL 32 (L) 08/14/2021 1018   CHOLHDL 4.7 08/14/2021 1018   CHOLHDL NOT REPORTED DUE TO HIGH TRIGLYCERIDES 01/29/2016 0538   VLDL UNABLE TO CALCULATE IF TRIGLYCERIDE OVER 400 mg/dL 01/29/2016 0538   LDLCALC 83 08/14/2021 1018   LDLDIRECT  108 (H) 10/04/2019 0857   LDLDIRECT 87 01/16/2015 0813    WEIGHTS: Wt Readings from Last 3 Encounters:  01/16/22 278 lb 9.6 oz  (126.4 kg)  12/17/21 272 lb 4 oz (123.5 kg)  08/21/20 270 lb (122.5 kg)    VITALS: BP 140/76   Pulse 66   Ht '5\' 11"'$  (1.803 m)   Wt 278 lb 9.6 oz (126.4 kg)   SpO2 97%   BMI 38.86 kg/m   EXAM: Deferred  EKG: Deferred  ASSESSMENT: Mixed hyperlipidemia, goal LDL less than 70 (enrolled in the Orion-4 trial) Coronary disease Hypertension Prior stroke Type 2 diabetes  PLAN: 1.   [name] has been doing well on his medications.  He is enrolled in the Orion-4 trial.  Hopefully when he finishes this trial he will be able to get inclisiran which is now commercially available.  Cost of medications has been challenging for him.  We will provide some paperwork for patient assistance today.  If the cost of the Vascepa is still high, could consider switching to [place] as patient assistance for Vascepa is not available.  Follow-up with me in 6 months.  Pixie Casino, MD, [facility], [place] Director of the Advanced Lipid Disorders &  Cardiovascular Risk Reduction Clinic Diplomate of the American Board of Clinical Lipidology Attending Cardiologist  Direct Dial: [phone]  Fax: [phone]  Website:  [email or web address] 01/16/2022, 4:00 PM

## 2022-01-16 NOTE — Patient Instructions (Addendum)
Medication Instructions:  Your physician recommends that you continue on your current medications as directed. Please refer to the Current Medication list given to you today.  Please apply for patient assistance for Jardiance & Eliquis.  You will need to turn in the application to the doctor's office that prescribes each of these medications  *If you need a refill on your cardiac medications before your next appointment, please call your pharmacy*  Follow-Up: At [facility], you and your health needs are our priority.  As part of our continuing mission to provide you with exceptional heart care, we have created designated Provider Care Teams.  These Care Teams include your primary Cardiologist (physician) and Advanced Practice Providers (APPs -  Physician Assistants and Nurse Practitioners) who all work together to provide you with the care you need, when you need it.  We recommend signing up for the patient portal called "MyChart".  Sign up information is provided on this After Visit Summary.  MyChart is used to connect with patients for Virtual Visits (Telemedicine).  Patients are able to view lab/test results, encounter notes, upcoming appointments, etc.  Non-urgent messages can be sent to your provider as well.   To learn more about what you can do with MyChart, go to [email or web address].    Your next appointment:   12 month(s)  The format for your next appointment:   In Person  Provider:   Lyman Bishop MD

## 2022-01-28 ENCOUNTER — Telehealth: Payer: Self-pay | Admitting: Family Medicine

## 2022-01-28 NOTE — Telephone Encounter (Signed)
Left message for patient to call back and schedule Medicare Annual Wellness Visit (AWV).   Please offer to do virtually or by telephone.  Left office number and my jabber [phone].  AWVI eligible as of  02/18/2019  Please schedule at anytime with Nurse Health Advisor.

## 2022-01-29 ENCOUNTER — Telehealth: Payer: Self-pay | Admitting: Internal Medicine

## 2022-01-29 NOTE — Telephone Encounter (Signed)
Eliquis assistance application faxed to [place] @ [phone]

## 2022-01-30 ENCOUNTER — Encounter: Payer: Self-pay | Admitting: Gastroenterology

## 2022-02-02 ENCOUNTER — Telehealth: Payer: Self-pay | Admitting: *Deleted

## 2022-02-02 NOTE — Telephone Encounter (Signed)
Called pt. To verify if taking ELIQUIS, pt. Is taking ELIQUIS OV scheduled for 03/04/22,pre-visit cancelled but procedure remained scheduled for 03/19/22. Instructed pt on location and arrival time 15 minutes prior to appointment,verbalized understanding.

## 2022-02-03 ENCOUNTER — Other Ambulatory Visit: Payer: Self-pay | Admitting: Internal Medicine

## 2022-02-10 DIAGNOSIS — E1165 Type 2 diabetes mellitus with hyperglycemia: Secondary | ICD-10-CM | POA: Diagnosis not present

## 2022-02-10 DIAGNOSIS — E7800 Pure hypercholesterolemia, unspecified: Secondary | ICD-10-CM | POA: Diagnosis not present

## 2022-02-10 DIAGNOSIS — E1140 Type 2 diabetes mellitus with diabetic neuropathy, unspecified: Secondary | ICD-10-CM | POA: Diagnosis not present

## 2022-02-11 DIAGNOSIS — Z006 Encounter for examination for normal comparison and control in clinical research program: Secondary | ICD-10-CM

## 2022-02-11 NOTE — Research (Signed)
Patient seen in the research clinic today for Month 15 visit of the ORION 4 trial. Reviewed all concomitant medications and no changes noted at this time. Patient denies any adverse events or hospitalizations since last visit. Lab work and IP given per protocol. Labs were drawn @ 0930. IP was given in [facility] @ 0940. Box number [id number]. Next appointment scheduled for January 15 @ 0930 for Month 21 visit.   Current Outpatient Medications:    apixaban (ELIQUIS) 5 MG TABS tablet, TAKE 1 TABLET BY MOUTH TWICE A DAY, Disp: 60 tablet, Rfl: 3   atorvastatin (LIPITOR) 40 MG tablet, Take 40 mg by mouth daily., Disp: , Rfl:    BD INSULIN SYRINGE U/F 31G X 5/16" 1 ML MISC, AS DIRECTED 3 TIMES A DAY SUBCUTANEOUS 30, Disp: , Rfl:    clopidogrel (PLAVIX) 75 MG tablet, TAKE 1 TABLET BY MOUTH EVERY DAY, Disp: 90 tablet, Rfl: 3   empagliflozin (JARDIANCE) 10 MG TABS tablet, Take 10 mg by mouth daily., Disp: , Rfl:    fenofibrate (TRICOR) 145 MG tablet, Take 1 tablet (145 mg total) by mouth daily. Keep scheduled appointment for further refills, Disp: 60 tablet, Rfl: 0   furosemide (LASIX) 40 MG tablet, Take 1 tablet (40 mg total) by mouth 2 (two) times daily. Keep scheduled appointment for further refills, Disp: 120 tablet, Rfl: 0   gabapentin (NEURONTIN) 300 MG capsule, Take 300 mg by mouth 2 (two) times daily. , Disp: , Rfl:    insulin NPH-regular Human (70-30) 100 UNIT/ML injection, Inject 65-75 Units into the skin See admin instructions. Inject 65-75 units with breakfast, 45-65 units at lunch and 75 units at dinner, Disp: , Rfl:    KLOR-CON M20 20 MEQ tablet, TAKE 1 TABLET BY MOUTH TWICE A DAY, Disp: 180 tablet, Rfl: 2   lisinopril (ZESTRIL) 20 MG tablet, 1 tablet, Disp: , Rfl:    metFORMIN (GLUCOPHAGE-XR) 500 MG 24 hr tablet, Take 1 tablet (500 mg total) by mouth 2 (two) times daily., Disp: , Rfl:    metoprolol succinate (TOPROL-XL) 25 MG 24 hr tablet, TAKE 1 TABLET (25 MG TOTAL) BY MOUTH DAILY., Disp: 90 tablet,  Rfl: 3   ONETOUCH VERIO test strip, AS DIRECTED 3 TIMES A DAY 30, Disp: , Rfl:    Study - ORION 4 - inclisiran 300 mg/1.55m or placebo SQ injection (PI-Stuckey), Inject 1.5 mLs (300 mg total) into the skin every 6 (six) months., Disp: 1 mL, Rfl: 1   VASCEPA 1 g capsule, TAKE 2 CAPSULES (2 G TOTAL) BY MOUTH 2 (TWO) TIMES DAILY. PLEASE KEEP SCHEDULED APPOINTMENT FOR FURTHER REFILLS (Patient not taking: Reported on 01/16/2022), Disp: 120 capsule, Rfl: 2  Current Facility-Administered Medications:    Study - ORION 4 - inclisiran 300 mg/1.53mor placebo SQ injection (PI-Stuckey), 300 mg, Subcutaneous, Q6 months, StLia FoyerThLoretha BrasilMD

## 2022-02-14 ENCOUNTER — Other Ambulatory Visit: Payer: Self-pay | Admitting: Internal Medicine

## 2022-02-17 DIAGNOSIS — G4733 Obstructive sleep apnea (adult) (pediatric): Secondary | ICD-10-CM | POA: Diagnosis not present

## 2022-02-19 ENCOUNTER — Other Ambulatory Visit: Payer: Self-pay

## 2022-02-19 ENCOUNTER — Other Ambulatory Visit: Payer: Self-pay | Admitting: Internal Medicine

## 2022-02-19 MED ORDER — FENOFIBRATE 145 MG PO TABS: 145.0000 mg | ORAL_TABLET | Freq: Every day | ORAL | 0 refills | Status: DC

## 2022-03-02 ENCOUNTER — Ambulatory Visit (INDEPENDENT_AMBULATORY_CARE_PROVIDER_SITE_OTHER): Payer: Medicare HMO

## 2022-03-02 ENCOUNTER — Telehealth: Payer: Self-pay | Admitting: Internal Medicine

## 2022-03-02 VITALS — Ht 71.0 in | Wt 273.0 lb

## 2022-03-02 DIAGNOSIS — Z0000 Encounter for general adult medical examination without abnormal findings: Secondary | ICD-10-CM | POA: Diagnosis not present

## 2022-03-02 NOTE — Telephone Encounter (Signed)
Called patient,no answer- LVM advising that I would send his message over to Dr.Hilty's primary nurse in regards to the patient assistance forms.

## 2022-03-02 NOTE — Telephone Encounter (Signed)
Pt wants an update on eliquis patient assistance forms. He knows that they have been faxed over but he states he is running out.
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
## 2022-03-02 NOTE — Patient Instructions (Signed)
[name] , Thank you for taking time to complete your Medicare Wellness Visit. I appreciate your ongoing commitment to your health goals. Please review the following plan we discussed and let me know if I can assist you in the future.   Screening recommendations/referrals: Colonoscopy: GI appt scheduled for this month Recommended yearly ophthalmology/optometry visit for glaucoma screening and checkup Recommended yearly dental visit for hygiene and checkup  Vaccinations: Influenza vaccine: Up to date Pneumococcal vaccine: Up to date Tdap vaccine: Up to date Shingles vaccine: Completed vaccines   Covid-19: Up to date  Advanced directives: Please bring a copy of Living Will and/or [place] for your chart once completed.   Conditions/risks identified: See problem list  Next appointment: Follow up in one year for your annual wellness visit.   Preventive Care 34 Years and Older, Male Preventive care refers to lifestyle choices and visits with your health care provider that can promote health and wellness. What does preventive care include? A yearly physical exam. This is also called an annual well check. Dental exams once or twice a year. Routine eye exams. Ask your health care provider how often you should have your eyes checked. Personal lifestyle choices, including: Daily care of your teeth and gums. Regular physical activity. Eating a healthy diet. Avoiding tobacco and drug use. Limiting alcohol use. Practicing safe sex. Taking low doses of aspirin every day. Taking vitamin and mineral supplements as recommended by your health care provider. What happens during an annual well check? The services and screenings done by your health care provider during your annual well check will depend on your age, overall health, lifestyle risk factors, and family history of disease. Counseling  Your health care provider may ask you questions about your: Alcohol  use. Tobacco use. Drug use. Emotional well-being. Home and relationship well-being. Sexual activity. Eating habits. History of falls. Memory and ability to understand (cognition). Work and work Statistician. Screening  You may have the following tests or measurements: Height, weight, and BMI. Blood pressure. Lipid and cholesterol levels. These may be checked every 5 years, or more frequently if you are over 41 years old. Skin check. Lung cancer screening. You may have this screening every year starting at age 68 if you have a 30-pack-year history of smoking and currently smoke or have quit within the past 15 years. Fecal occult blood test (FOBT) of the stool. You may have this test every year starting at age 68. Flexible sigmoidoscopy or colonoscopy. You may have a sigmoidoscopy every 5 years or a colonoscopy every 10 years starting at age 68. Prostate cancer screening. Recommendations will vary depending on your family history and other risks. Hepatitis C blood test. Hepatitis B blood test. Sexually transmitted disease (STD) testing. Diabetes screening. This is done by checking your blood sugar (glucose) after you have not eaten for a while (fasting). You may have this done every 1-3 years. Abdominal aortic aneurysm (AAA) screening. You may need this if you are a current or former smoker. Osteoporosis. You may be screened starting at age 68 if you are at high risk. Talk with your health care provider about your test results, treatment options, and if necessary, the need for more tests. Vaccines  Your health care provider may recommend certain vaccines, such as: Influenza vaccine. This is recommended every year. Tetanus, diphtheria, and acellular pertussis (Tdap, Td) vaccine. You may need a Td booster every 10 years. Zoster vaccine. You may need this after age 65. Pneumococcal 13-valent conjugate (  PCV13) vaccine. One dose is recommended after age 68. Pneumococcal polysaccharide  (PPSV23) vaccine. One dose is recommended after age 68. Talk to your health care provider about which screenings and vaccines you need and how often you need them. This information is not intended to replace advice given to you by your health care provider. Make sure you discuss any questions you have with your health care provider. Document Released: 08/02/2015 Document Revised: 03/25/2016 Document Reviewed: 05/07/2015 Elsevier Interactive Patient Education  2017 [place] Prevention in the Home Falls can cause injuries. They can happen to people of all ages. There are many things you can do to make your home safe and to help prevent falls. What can I do on the outside of my home? Regularly fix the edges of walkways and driveways and fix any cracks. Remove anything that might make you trip as you walk through a door, such as a raised step or threshold. Trim any bushes or trees on the path to your home. Use bright outdoor lighting. Clear any walking paths of anything that might make someone trip, such as rocks or tools. Regularly check to see if handrails are loose or broken. Make sure that both sides of any steps have handrails. Any raised decks and porches should have guardrails on the edges. Have any leaves, snow, or ice cleared regularly. Use sand or salt on walking paths during winter. Clean up any spills in your garage right away. This includes oil or grease spills. What can I do in the bathroom? Use night lights. Install grab bars by the toilet and in the tub and shower. Do not use towel bars as grab bars. Use non-skid mats or decals in the tub or shower. If you need to sit down in the shower, use a plastic, non-slip stool. Keep the floor dry. Clean up any water that spills on the floor as soon as it happens. Remove soap buildup in the tub or shower regularly. Attach bath mats securely with double-sided non-slip rug tape. Do not have throw rugs and other things on the  floor that can make you trip. What can I do in the bedroom? Use night lights. Make sure that you have a light by your bed that is easy to reach. Do not use any sheets or blankets that are too big for your bed. They should not hang down onto the floor. Have a firm chair that has side arms. You can use this for support while you get dressed. Do not have throw rugs and other things on the floor that can make you trip. What can I do in the kitchen? Clean up any spills right away. Avoid walking on wet floors. Keep items that you use a lot in easy-to-reach places. If you need to reach something above you, use a strong step stool that has a grab bar. Keep electrical cords out of the way. Do not use floor polish or wax that makes floors slippery. If you must use wax, use non-skid floor wax. Do not have throw rugs and other things on the floor that can make you trip. What can I do with my stairs? Do not leave any items on the stairs. Make sure that there are handrails on both sides of the stairs and use them. Fix handrails that are broken or loose. Make sure that handrails are as long as the stairways. Check any carpeting to make sure that it is firmly attached to the stairs. Fix any carpet that is  loose or worn. Avoid having throw rugs at the top or bottom of the stairs. If you do have throw rugs, attach them to the floor with carpet tape. Make sure that you have a light switch at the top of the stairs and the bottom of the stairs. If you do not have them, ask someone to add them for you. What else can I do to help prevent falls? Wear shoes that: Do not have high heels. Have rubber bottoms. Are comfortable and fit you well. Are closed at the toe. Do not wear sandals. If you use a stepladder: Make sure that it is fully opened. Do not climb a closed stepladder. Make sure that both sides of the stepladder are locked into place. Ask someone to hold it for you, if possible. Clearly Sim and make  sure that you can see: Any grab bars or handrails. First and last steps. Where the edge of each step is. Use tools that help you move around (mobility aids) if they are needed. These include: Canes. Walkers. Scooters. Crutches. Turn on the lights when you go into a dark area. Replace any light bulbs as soon as they burn out. Set up your furniture so you have a clear path. Avoid moving your furniture around. If any of your floors are uneven, fix them. If there are any pets around you, be aware of where they are. Review your medicines with your doctor. Some medicines can make you feel dizzy. This can increase your chance of falling. Ask your doctor what other things that you can do to help prevent falls. This information is not intended to replace advice given to you by your health care provider. Make sure you discuss any questions you have with your health care provider. Document Released: 05/02/2009 Document Revised: 12/12/2015 Document Reviewed: 08/10/2014 Elsevier Interactive Patient Education  2017 [employer].

## 2022-03-04 ENCOUNTER — Encounter: Payer: Self-pay | Admitting: Gastroenterology

## 2022-03-04 ENCOUNTER — Ambulatory Visit (INDEPENDENT_AMBULATORY_CARE_PROVIDER_SITE_OTHER): Payer: Medicare HMO | Admitting: Gastroenterology

## 2022-03-04 VITALS — BP 170/80 | HR 54 | Ht 68.0 in | Wt 278.8 lb

## 2022-03-04 DIAGNOSIS — Z1211 Encounter for screening for malignant neoplasm of colon: Secondary | ICD-10-CM

## 2022-03-04 DIAGNOSIS — I739 Peripheral vascular disease, unspecified: Secondary | ICD-10-CM

## 2022-03-04 DIAGNOSIS — Z7902 Long term (current) use of antithrombotics/antiplatelets: Secondary | ICD-10-CM

## 2022-03-04 DIAGNOSIS — Z9989 Dependence on other enabling machines and devices: Secondary | ICD-10-CM

## 2022-03-04 DIAGNOSIS — I480 Paroxysmal atrial fibrillation: Secondary | ICD-10-CM

## 2022-03-04 DIAGNOSIS — G4733 Obstructive sleep apnea (adult) (pediatric): Secondary | ICD-10-CM

## 2022-03-04 DIAGNOSIS — I2510 Atherosclerotic heart disease of native coronary artery without angina pectoris: Secondary | ICD-10-CM

## 2022-03-04 NOTE — Patient Instructions (Signed)
_______________________________________________________  If you are age 68 or older, your body mass index should be between 23-30. Your Body mass index is 42.39 kg/m. If this is out of the aforementioned range listed, please consider follow up with your Primary Care Provider.  If you are age 24 or younger, your body mass index should be between 19-25. Your Body mass index is 42.39 kg/m. If this is out of the aformentioned range listed, please consider follow up with your Primary Care Provider.   ________________________________________________________  The [facility] providers would like to encourage you to use [facility] to communicate with providers for non-urgent requests or questions.  Due to long hold times on the telephone, sending your provider a message by [facility] may be a faster and more efficient way to get a response.  Please allow 48 business hours for a response.  Please remember that this is for non-urgent requests.  _______________________________________________________  We have added you to the [facility] wait list a procedure. We will reach out to you to schedule.  It was a pleasure to see you today!  Thank you for trusting me with your gastrointestinal care!

## 2022-03-04 NOTE — Telephone Encounter (Signed)
Called [employer] about application. Was notified that cover page was received along with bank statement but remainder of application did not go thru. Was also notified that bank statement would not suffice -- would need W2 or SS statement as proof of income.   LM for patient with the aforementioned info and advised 4 boxes of eliquis '5mg'$  samples would be left for pick up.
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
## 2022-03-10 ENCOUNTER — Telehealth: Payer: Self-pay | Admitting: Internal Medicine

## 2022-03-10 NOTE — Telephone Encounter (Signed)
   Pre-operative Risk Assessment    Patient Name: [name]  DOB: Dec 14, 1955 MRN: [id number]     Request for Surgical Clearance    Procedure:   Extracting 3 teeth  Date of Surgery:  Clearance TBD                                 Surgeon:    Dr. Neena Rhymes Group or Practice Name:   Phone number:  [phone] Fax number:  [phone]   Type of Clearance Requested:   - Medical    Type of Anesthesia:   Nitrous oxide and oral valium   Additional requests/questions:   Caller is requesting to stop blood thinner medication.  Request for clearance faxed 02/16/22.  Signed, Heloise Beecham   03/10/2022, 8:49 AM

## 2022-03-10 NOTE — Telephone Encounter (Signed)
     Primary Cardiologist: Quay Burow, MD  Chart reviewed as part of pre-operative protocol coverage. Given past medical history and time since last visit, based on ACC/AHA guidelines, [name] would be at acceptable risk for the planned procedure without further cardiovascular testing.   Patient with diagnosis of afib on Eliquis for anticoagulation.     Procedure: 3 dental extractions Date of procedure: TBD   CHA2DS2-VASc Score = 6  This indicates a 9.7% annual risk of stroke. The patient's score is based upon: CHF History: 0 HTN History: 1 Diabetes History: 1 Stroke History: 2 Vascular Disease History: 1 Age Score: 1 Gender Score: 0   CrCl 70m/min using adjusted body weight due to obesity Platelet count 295K   Patient does not require pre-op antibiotics for dental procedure.   Per office protocol, patient can hold 1 dose of Eliquis prior to extractions and resume as soon as safely possible after given his elevated CV risk off of anticoagulation including hx of afib and multiple strokes.  I will route this recommendation to the requesting party via Epic fax function and remove from pre-op pool.  Please call with questions.  JJossie Ng Elizeth Weinrich NP-C     03/10/2022, 1:20 PM [facility] [id number] Office [phone] [phone]

## 2022-03-10 NOTE — Telephone Encounter (Signed)
Patient with diagnosis of afib on Eliquis for anticoagulation.    Procedure: 3 dental extractions Date of procedure: TBD  CHA2DS2-VASc Score = 6  This indicates a 9.7% annual risk of stroke. The patient's score is based upon: CHF History: 0 HTN History: 1 Diabetes History: 1 Stroke History: 2 Vascular Disease History: 1 Age Score: 1 Gender Score: 0   CrCl 9m/min using adjusted body weight due to obesity Platelet count 295K  Patient does not require pre-op antibiotics for dental procedure.  Per office protocol, patient can hold 1 dose of Eliquis prior to extractions and resume as soon as safely possible after given his elevated CV risk off of anticoagulation including hx of afib and multiple strokes.  **This guidance is not considered finalized until pre-operative APP has relayed final recommendations.**

## 2022-03-10 NOTE — Telephone Encounter (Signed)
Application re-faxed on 03/10/22

## 2022-03-11 NOTE — Telephone Encounter (Signed)
Christina from Dr. Jetta Lout is calling back because they are needing clearance and instruction for Plavix, as well, but only received clearance for Eliquis. Please advise.

## 2022-03-12 NOTE — Telephone Encounter (Signed)
Will forward back to pre op provider as needing recommendations for holding Plavix. Per requesting office they only received the recommendations for Eliquis.

## 2022-03-13 NOTE — Telephone Encounter (Signed)
   Patient Name: [name]  DOB: 1956/07/05 MRN: [id number]  Primary Cardiologist: Quay Burow, MD  Chart reviewed as part of pre-operative protocol coverage. Pre-op clearance already addressed by colleagues in earlier phone notes. To summarize recommendations:  Patient does not require pre-op antibiotics for dental procedure.   Per office protocol, patient can hold 1 dose of Eliquis prior to extractions and resume as soon as safely possible after given his elevated CV risk off of anticoagulation including hx of afib and multiple strokes.  Okay to hold Plavix x5 days for dental extraction.  Please restart when medically safe to do so.   Will route this bundled recommendation to requesting provider via Epic fax function and remove from pre-op pool. Please call with questions.  Elgie Collard, PA-C 03/13/2022, 8:02 AM

## 2022-03-17 ENCOUNTER — Telehealth: Payer: Self-pay | Admitting: Gastroenterology

## 2022-03-17 NOTE — Telephone Encounter (Signed)
Understood, thanks. We will wait to hear from him when he is ready to proceed with the colonoscopy.  - HD

## 2022-03-17 NOTE — Telephone Encounter (Signed)
Patient called to cancel the colonoscopy scheduled for 03/19/22. States he is getting ready to go into the hospital for a test and is ot ready to proceed with a colonoscopy.

## 2022-03-19 ENCOUNTER — Encounter: Payer: Medicare HMO | Admitting: Gastroenterology

## 2022-03-20 DIAGNOSIS — G4733 Obstructive sleep apnea (adult) (pediatric): Secondary | ICD-10-CM | POA: Diagnosis not present

## 2022-03-27 ENCOUNTER — Other Ambulatory Visit: Payer: Self-pay | Admitting: Internal Medicine

## 2022-03-31 ENCOUNTER — Telehealth: Payer: Self-pay

## 2022-03-31 DIAGNOSIS — Z1211 Encounter for screening for malignant neoplasm of colon: Secondary | ICD-10-CM

## 2022-03-31 NOTE — Telephone Encounter (Signed)
-----   Message from Doran Stabler, MD sent at 03/31/2022  4:37 PM EDT ----- Regarding: ?  [facility] outpatient colonoscopy [place],     Please contact this patient soon to see if he wants to have his screening colonoscopy at [facility] on 04/30/22.    - HD

## 2022-03-31 NOTE — Telephone Encounter (Signed)
Left patient a detailed vm asking if he would be available to have colonoscopy at [facility] on Thursday, 04/30/22 with Dr. Loletha Carrow. I told pt that he will need a care partner this day. I asked that patient five me a call back to discuss specific time for procedure.

## 2022-04-02 ENCOUNTER — Other Ambulatory Visit: Payer: Self-pay

## 2022-04-02 ENCOUNTER — Telehealth: Payer: Self-pay

## 2022-04-02 DIAGNOSIS — Z1211 Encounter for screening for malignant neoplasm of colon: Secondary | ICD-10-CM

## 2022-04-02 MED ORDER — NA SULFATE-K SULFATE-MG SULF 17.5-3.13-1.6 GM/177ML PO SOLN: 1.0000 | Freq: Once | ORAL | 0 refills | Status: DC

## 2022-04-02 NOTE — Telephone Encounter (Signed)
[facility] Pre-operative Risk Assessment     Request for surgical clearance:     Endoscopy Procedure  What type of surgery is being performed?     Colonoscopy     When is this surgery scheduled?  04/30/22  What type of clearance is required ?   Pharmacy  Are there any medications that need to be held prior to surgery and how long? Eliquis 2 days and Plavix 5 days  Practice name and name of physician performing surgery?      [facility]  What is your office phone and fax number?      Phone- [phone]  [phone]  Anesthesia type (None, local, MAC, general) ?       MAC

## 2022-04-02 NOTE — Telephone Encounter (Addendum)
Received a vm from patient returning my call about 10/12 procedure at [facility]. Called patient back twice. I had to leave another vm for him to return call.   Called and spoke with patient. He states that he is available on 10/12 for colonoscopy appt. Pt is aware that his appt will be at 8:30 am and he will need to arrive at [facility] by 7 am with a care partner. Pt is aware that I am currently waiting on approval for him to hold Plavix and Eliquis for his procedure. I told pt that once I have cardiac clearance I will send his instructions via MyChart and I will place a copy in the mail for him to review. Pt knows that I sent his prep to [facility] pharmacy on file, pt has been advised to pick up prep this weekend. Pt verbalized understanding of all information and had no concerns at the end of the call.   Ambulatory referral to GI in epic.  SUPREP sent to pharmacy on file. See 9/14 cardiac clearance telephone encounter for details.  Instructions will be mailed to patient and sent via MyChart once cardiac clearance has been obtained.

## 2022-04-02 NOTE — Telephone Encounter (Signed)
No return call received from patient. Patient added back to hospital wait list.

## 2022-04-02 NOTE — Addendum Note (Signed)
Addended by: Yevette Edwards on: 04/02/2022 01:20 PM   Modules accepted: Orders

## 2022-04-03 NOTE — Telephone Encounter (Signed)
Attempted to reach patient to schedule telehealth visit. No answer. Lvm

## 2022-04-03 NOTE — Telephone Encounter (Signed)
Primary Cardiologist:Jonathan Gwenlyn Found, MD   Preoperative team, please contact this patient and set up a phone call appointment late September/early October for further preoperative risk assessment. Please obtain consent and complete medication review. Thank you for your help.   Previously cleared to hold Plavix for 5 days prior to dental extractions 02/2022.  Per office protocol, patient can hold 1 dose of Eliquis prior to extractions and resume as soon as safely possible after given his elevated CV risk off of anticoagulation including hx of afib and multiple strokes.  Emmaline Life, NP-C     04/03/2022, 11:32 AM [address] Office [phone] Fax [phone]

## 2022-04-06 ENCOUNTER — Telehealth: Payer: Self-pay | Admitting: *Deleted

## 2022-04-06 NOTE — Telephone Encounter (Signed)
Pt agreeable to tele pre op appt 04/17/22 @ 10 am. Med rec and consent are done.

## 2022-04-06 NOTE — Telephone Encounter (Signed)
Patient was returning call. Please advise ?

## 2022-04-06 NOTE — Telephone Encounter (Signed)
Pt agreeable to tele pre op appt 04/17/22 @ 10 am. Med rec and consent are done.      Patient Consent for Virtual Visit        DAIDEN COLTRANE has provided verbal consent on 04/06/2022 for a virtual visit (video or telephone).   CONSENT FOR VIRTUAL VISIT FOR:  Jeannette How Wayson  By participating in this virtual visit I agree to the following:  I hereby voluntarily request, consent and authorize [facility] and its employed or contracted physicians, physician assistants, nurse practitioners or other licensed health care professionals (the Practitioner), to provide me with telemedicine health care services (the "Services") as deemed necessary by the treating Practitioner. I acknowledge and consent to receive the Services by the Practitioner via telemedicine. I understand that the telemedicine visit will involve communicating with the Practitioner through live audiovisual communication technology and the disclosure of certain medical information by electronic transmission. I acknowledge that I have been given the opportunity to request an in-person assessment or other available alternative prior to the telemedicine visit and am voluntarily participating in the telemedicine visit.  I understand that I have the right to withhold or withdraw my consent to the use of telemedicine in the course of my care at any time, without affecting my right to future care or treatment, and that the Practitioner or I may terminate the telemedicine visit at any time. I understand that I have the right to inspect all information obtained and/or recorded in the course of the telemedicine visit and may receive copies of available information for a reasonable fee.  I understand that some of the potential risks of receiving the Services via telemedicine include:  Delay or interruption in medical evaluation due to technological equipment failure or disruption; Information transmitted may not be sufficient (e.g. poor resolution  of images) to allow for appropriate medical decision making by the Practitioner; and/or  In rare instances, security protocols could fail, causing a breach of personal health information.  Furthermore, I acknowledge that it is my responsibility to provide information about my medical history, conditions and care that is complete and accurate to the best of my ability. I acknowledge that Practitioner's advice, recommendations, and/or decision may be based on factors not within their control, such as incomplete or inaccurate data provided by me or distortions of diagnostic images or specimens that may result from electronic transmissions. I understand that the practice of medicine is not an exact science and that Practitioner makes no warranties or guarantees regarding treatment outcomes. I acknowledge that a copy of this consent can be made available to me via my patient portal ([facility]), or I can request a printed copy by calling the office of [facility].    I understand that my insurance will be billed for this visit.   I have read or had this consent read to me. I understand the contents of this consent, which adequately explains the benefits and risks of the Services being provided via telemedicine.  I have been provided ample opportunity to ask questions regarding this consent and the Services and have had my questions answered to my satisfaction. I give my informed consent for the services to be provided through the use of telemedicine in my medical care

## 2022-04-17 ENCOUNTER — Ambulatory Visit: Payer: Medicare HMO | Attending: Cardiovascular Disease | Admitting: Nurse Practitioner

## 2022-04-17 DIAGNOSIS — Z01810 Encounter for preprocedural cardiovascular examination: Secondary | ICD-10-CM | POA: Diagnosis not present
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
## 2022-04-17 NOTE — Telephone Encounter (Signed)
Patient had virtual visit with cardiology today. Approved to hold Plavix 5 days and Eliquis 1 day. Instructions sent to patient via MyChart and a copy has been mailed.

## 2022-04-19 DIAGNOSIS — G4733 Obstructive sleep apnea (adult) (pediatric): Secondary | ICD-10-CM | POA: Diagnosis not present

## 2022-04-30 ENCOUNTER — Encounter (HOSPITAL_COMMUNITY)
Admission: RE | Disposition: A | Discharge status: Home / Self Care | Payer: Self-pay | Source: Home / Self Care | Attending: Gastroenterology

## 2022-04-30 ENCOUNTER — Ambulatory Visit (HOSPITAL_BASED_OUTPATIENT_CLINIC_OR_DEPARTMENT_OTHER): Payer: Medicare HMO | Admitting: Certified Registered"

## 2022-04-30 ENCOUNTER — Encounter (HOSPITAL_COMMUNITY): Payer: Self-pay | Admitting: Gastroenterology

## 2022-04-30 ENCOUNTER — Other Ambulatory Visit: Payer: Self-pay

## 2022-04-30 ENCOUNTER — Ambulatory Visit (HOSPITAL_COMMUNITY)
Admission: RE | Admit: 2022-04-30 | Discharge: 2022-04-30 | Disposition: A | Discharge status: Home / Self Care | Payer: Medicare HMO | Attending: Gastroenterology | Admitting: Gastroenterology

## 2022-04-30 ENCOUNTER — Ambulatory Visit (HOSPITAL_COMMUNITY): Payer: Medicare HMO | Admitting: Certified Registered"

## 2022-04-30 DIAGNOSIS — D128 Benign neoplasm of rectum: Secondary | ICD-10-CM | POA: Diagnosis not present

## 2022-04-30 DIAGNOSIS — D126 Benign neoplasm of colon, unspecified: Secondary | ICD-10-CM | POA: Diagnosis not present

## 2022-04-30 DIAGNOSIS — Z1211 Encounter for screening for malignant neoplasm of colon: Secondary | ICD-10-CM | POA: Diagnosis not present

## 2022-04-30 DIAGNOSIS — D123 Benign neoplasm of transverse colon: Secondary | ICD-10-CM

## 2022-04-30 DIAGNOSIS — I10 Essential (primary) hypertension: Secondary | ICD-10-CM | POA: Diagnosis not present

## 2022-04-30 DIAGNOSIS — E1151 Type 2 diabetes mellitus with diabetic peripheral angiopathy without gangrene: Secondary | ICD-10-CM | POA: Diagnosis not present

## 2022-04-30 DIAGNOSIS — Z794 Long term (current) use of insulin: Secondary | ICD-10-CM | POA: Diagnosis not present

## 2022-04-30 DIAGNOSIS — Z7901 Long term (current) use of anticoagulants: Secondary | ICD-10-CM | POA: Insufficient documentation

## 2022-04-30 DIAGNOSIS — I252 Old myocardial infarction: Secondary | ICD-10-CM | POA: Diagnosis not present

## 2022-04-30 DIAGNOSIS — K621 Rectal polyp: Secondary | ICD-10-CM | POA: Diagnosis not present

## 2022-04-30 DIAGNOSIS — I2510 Atherosclerotic heart disease of native coronary artery without angina pectoris: Secondary | ICD-10-CM

## 2022-04-30 DIAGNOSIS — Z7902 Long term (current) use of antithrombotics/antiplatelets: Secondary | ICD-10-CM | POA: Insufficient documentation

## 2022-04-30 DIAGNOSIS — D124 Benign neoplasm of descending colon: Secondary | ICD-10-CM | POA: Diagnosis not present

## 2022-04-30 DIAGNOSIS — K635 Polyp of colon: Secondary | ICD-10-CM | POA: Diagnosis not present

## 2022-04-30 DIAGNOSIS — Z1212 Encounter for screening for malignant neoplasm of rectum: Secondary | ICD-10-CM | POA: Diagnosis not present

## 2022-04-30 DIAGNOSIS — D122 Benign neoplasm of ascending colon: Secondary | ICD-10-CM | POA: Diagnosis not present

## 2022-04-30 HISTORY — PX: POLYPECTOMY: SHX5525

## 2022-04-30 HISTORY — PX: COLONOSCOPY WITH PROPOFOL: SHX5780

## 2022-04-30 HISTORY — PX: HEMOSTASIS CLIP PLACEMENT: SHX6857

## 2022-04-30 LAB — GLUCOSE, CAPILLARY
Glucose-Capillary: 105 mg/dL — ABNORMAL HIGH (ref 70–99)
Glucose-Capillary: 180 mg/dL — ABNORMAL HIGH (ref 70–99)

## 2022-04-30 SURGERY — COLONOSCOPY WITH PROPOFOL
Anesthesia: Monitor Anesthesia Care

## 2022-04-30 MED ORDER — LACTATED RINGERS IV SOLN: INTRAVENOUS | Status: DC | PRN

## 2022-04-30 MED ORDER — PROPOFOL 10 MG/ML IV BOLUS
INTRAVENOUS | Status: DC | PRN
  Administered 2022-04-30: 60 mg via INTRAVENOUS

## 2022-04-30 MED ORDER — SODIUM CHLORIDE 0.9 % IV SOLN: INTRAVENOUS | Status: DC

## 2022-04-30 MED ORDER — LIDOCAINE 2% (20 MG/ML) 5 ML SYRINGE
INTRAMUSCULAR | Status: DC | PRN
  Administered 2022-04-30: 60 mg via INTRAVENOUS

## 2022-04-30 MED ORDER — GLYCOPYRROLATE 0.2 MG/ML IJ SOLN
INTRAMUSCULAR | Status: DC | PRN
  Administered 2022-04-30: .1 mg via INTRAVENOUS

## 2022-04-30 MED ORDER — GLUCAGON HCL RDNA (DIAGNOSTIC) 1 MG IJ SOLR
INTRAMUSCULAR | Status: DC | PRN
  Administered 2022-04-30 (×2): .5 mg via INTRAVENOUS

## 2022-04-30 MED ORDER — GLUCAGON HCL RDNA (DIAGNOSTIC) 1 MG IJ SOLR
INTRAMUSCULAR | Status: AC
  Filled 2022-04-30: qty 1

## 2022-04-30 MED ORDER — PROPOFOL 500 MG/50ML IV EMUL
INTRAVENOUS | Status: DC | PRN
  Administered 2022-04-30: 125 ug/kg/min via INTRAVENOUS

## 2022-04-30 SURGICAL SUPPLY — 22 items

## 2022-04-30 NOTE — Discharge Instructions (Signed)
Please see your instruction sheet.  Resume both your plavix and Eliquis at usual dose in the morning of 05/02/22 - (the day after tomorrow)  - Dr. Loletha Carrow  ___________________________________________
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
## 2022-04-30 NOTE — Op Note (Signed)
[facility] Patient Name: [name] Procedure Date : 04/30/2022 MRN: [id number] Attending MD: Estill Cotta. Loletha Carrow , MD Date of Birth: 07/25/1955 CSN: [id number] Age: 66 Admit Type: Outpatient Procedure:                Colonoscopy Indications:              Screening for colorectal malignant neoplasm, This                            is the patient's first colonoscopy Providers:                Mallie Mussel L. Loletha Carrow, MD, Allayne Gitelman, RN, Gloris Ham, Technician Referring MD:             Crosby Oyster Wendling Medicines:                Monitored Anesthesia Care Complications:            No immediate complications. Estimated Blood Loss:     Estimated blood loss was minimal. Procedure:                Pre-Anesthesia Assessment:                           - Prior to the procedure, a History and Physical                            was performed, and patient medications and                            allergies were reviewed. The patient's tolerance of                            previous anesthesia was also reviewed. The risks                            and benefits of the procedure and the sedation                            options and risks were discussed with the patient.                            All questions were answered, and informed consent                            was obtained. Prior Anticoagulants: The patient                            last took Eliquis (apixaban) 2 days and Plavix                            (clopidogrel) 5 days prior to the procedure. ASA  Grade Assessment: III - A patient with severe                            systemic disease. After reviewing the risks and                            benefits, the patient was deemed in satisfactory                            condition to undergo the procedure.                           After obtaining informed consent, the colonoscope                            was  passed under direct vision. Throughout the                            procedure, the patient's blood pressure, pulse, and                            oxygen saturations were monitored continuously. The                            CF-HQ190L ([id number] Olympus coloscope was                            introduced through the anus and advanced to the the                            cecum, identified by appendiceal orifice and                            ileocecal valve. The colonoscopy was performed with                            difficulty due to a redundant colon and the                            patient's body habitus. Successful completion of                            the procedure was aided by using manual pressure,                            straightening and shortening the scope to obtain                            bowel loop reduction and lavage. The patient                            tolerated the procedure well. The quality of the  bowel preparation was good. The ileocecal valve,                            appendiceal orifice, and rectum were photographed. Scope In: 8:20:28 AM Scope Out: 9:27:39 AM Scope Withdrawal Time: 1 hour 1 minute 41 seconds  Total Procedure Duration: 1 hour 7 minutes 11 seconds  Findings:      The perianal and digital rectal examinations were normal.      Four sessile polyps were found in the ascending colon. The polyps were 4       to 6 mm in size. These polyps were removed with a cold snare. Resection       and retrieval were complete. (JAr 1)      Six sessile polyps were found in the transverse colon and ascending       colon. The polyps were 4 to 8 mm in size. These polyps were removed with       a cold snare. Resection and retrieval were complete. (Jar 2)      A 16 mm polyp was found in the hepatic flexure. The polyp was       multi-lobulated and sessile. The polyp was removed with a piecemeal       technique using a hot snare.  Resection and retrieval were complete. To       stop active bleeding, three hemostatic clips were successfully placed       (MR conditional). There was no bleeding at the end of the procedure.       (Jar 2)      Five sessile polyps were found in the transverse colon. The polyps were       4 to 8 mm in size. These polyps were removed with a cold snare.       Resection and retrieval were complete. (Jar 3)      A 6 mm polyp was found in the descending colon. The polyp was sessile.       The polyp was removed with a cold snare. Resection and retrieval were       complete. (Jar 4)      Two sessile polyps were found in the rectum. The polyps were 2 to 8 mm       in size. These polyps were removed with a cold snare. Resection and       retrieval were complete. To stop active bleeding, two hemostatic clips       were successfully placed (MR conditional). There was no bleeding at the       end of the procedure. (Jar 5)      The exam was otherwise without abnormality on direct and retroflexion       views. Impression:               - Four 4 to 6 mm polyps in the ascending colon,                            removed with a cold snare. Resected and retrieved.                           - Six 4 to 8 mm polyps in the transverse colon and  in the ascending colon, removed with a cold snare.                            Resected and retrieved.                           - One 16 mm polyp at the hepatic flexure, removed                            piecemeal using a hot snare. Resected and                            retrieved. Clips (MR conditional) were placed.                           - Five 4 to 8 mm polyps in the transverse colon,                            removed with a cold snare. Resected and retrieved.                           - One 6 mm polyp in the descending colon, removed                            with a cold snare. Resected and retrieved.                           - Two  2 to 8 mm polyps in the rectum, removed with                            a cold snare. Resected and retrieved. Clips (MR                            conditional) were placed.                           - The examination was otherwise normal on direct                            and retroflexion views. Recommendation:           - Patient has a contact number available for                            emergencies. The signs and symptoms of potential                            delayed complications were discussed with the                            patient. Return to normal activities tomorrow.                            Written discharge  instructions were provided to the                            patient.                           - Resume previous diet.                           - Resume Eliquis (apixaban) in 2 days and Plavix                            (clopidogrel) in 2 days at prior doses.                           - Await pathology results.                           - Repeat colonoscopy is recommended for                            surveillance. The colonoscopy date will be                            determined after pathology results from today's                            exam become available for review. Procedure Code(s):        --- Professional ---                           [phone], Colonoscopy, flexible; with removal of                            tumor(s), polyp(s), or other lesion(s) by snare                            technique Diagnosis Code(s):        --- Professional ---                           Z12.11, Encounter for screening for malignant                            neoplasm of colon                           K63.5, Polyp of colon                           K62.1, Rectal polyp CPT copyright 2019 [employer]. All rights reserved. The codes documented in this report are preliminary and upon coder review may  be revised to meet current compliance  requirements. Ariya Bohannon L. Loletha Carrow, MD 04/30/2022 9:40:58 AM This report has been signed electronically. Number of Addenda: 0

## 2022-04-30 NOTE — Anesthesia Postprocedure Evaluation (Signed)
Anesthesia Post Note  Patient: [name]  Procedure(s) Performed: COLONOSCOPY WITH PROPOFOL POLYPECTOMY HEMOSTASIS CLIP PLACEMENT HOT HEMOSTASIS (ARGON PLASMA COAGULATION/BICAP)     Patient location during evaluation: PACU Anesthesia Type: MAC Level of consciousness: awake Pain management: pain level controlled Vital Signs Assessment: post-procedure vital signs reviewed and stable Respiratory status: spontaneous breathing Cardiovascular status: stable Postop Assessment: no apparent nausea or vomiting Anesthetic complications: no   No notable events documented.  Last Vitals:  Vitals:   04/30/22 0945 04/30/22 0950  BP: (!) 178/91   Pulse: 60 (!) 53  Resp: 16 17  Temp:    SpO2: 95% 95%    Last Pain:  Vitals:   04/30/22 0945  TempSrc:   PainSc: 0-No pain                 Huston Foley

## 2022-04-30 NOTE — Anesthesia Preprocedure Evaluation (Addendum)
Anesthesia Evaluation  Patient identified by MRN, date of birth, ID band Patient awake    Reviewed: Allergy & Precautions, NPO status , Patient's Chart, lab work & pertinent test results  Airway Mallampati: II  TM Distance: >3 FB Neck ROM: Full    Dental  (+) Missing, Dental Advisory Given   Pulmonary    Pulmonary exam normal breath sounds clear to auscultation       Cardiovascular hypertension, Pt. on medications + CAD, + Past MI and + Peripheral Vascular Disease  Normal cardiovascular exam Rhythm:Regular Rate:Normal     Neuro/Psych    GI/Hepatic   Endo/Other  diabetes, Poorly Controlled, Type 2  Renal/GU   negative genitourinary   Musculoskeletal   Abdominal (+) + obese,   Peds  Hematology   Anesthesia Other Findings   Reproductive/Obstetrics                             Anesthesia Physical  Anesthesia Plan  ASA: III  Anesthesia Plan: MAC   Post-op Pain Management:    Induction:   PONV Risk Score and Plan: 1 and Propofol infusion and TIVA  Airway Management Planned: Natural Airway, Nasal Cannula and Simple Face Mask  Additional Equipment: None  Intra-op Plan:   Post-operative Plan:   Informed Consent: I have reviewed the patients History and Physical, chart, labs and discussed the procedure including the risks, benefits and alternatives for the proposed anesthesia with the patient or authorized representative who has indicated his/her understanding and acceptance.       Plan Discussed with: CRNA  Anesthesia Plan Comments: (Aline, 2x piv)        Anesthesia Quick Evaluation                                  Anesthesia Evaluation  Patient identified by MRN, date of birth, ID band Patient awake    Reviewed: Allergy & Precautions, H&P , NPO status , Patient's Chart, lab work & pertinent test results  Airway Mallampati: III TM Distance: <3 FB Neck ROM:  Full    Dental No notable dental hx.    Pulmonary neg pulmonary ROS,  breath sounds clear to auscultation  + decreased breath sounds      Cardiovascular hypertension, + CAD, + Past MI, + CABG and + Peripheral Vascular Disease Rhythm:Regular Rate:Normal     Neuro/Psych negative neurological ROS  negative psych ROS   GI/Hepatic negative GI ROS, Neg liver ROS,   Endo/Other  diabetes, Type 1, Insulin Dependent  Renal/GU negative Renal ROS  negative genitourinary   Musculoskeletal negative musculoskeletal ROS (+)   Abdominal   Peds negative pediatric ROS (+)  Hematology negative hematology ROS (+)   Anesthesia Other Findings   Reproductive/Obstetrics negative OB ROS                           Anesthesia Physical Anesthesia Plan  ASA: III  Anesthesia Plan: General   Post-op Pain Management:    Induction: Intravenous  Airway Management Planned: Oral ETT and Video Laryngoscope Planned  Additional Equipment:   Intra-op Plan:   Post-operative Plan: Extubation in OR  Informed Consent: I have reviewed the patients History and Physical, chart, labs and discussed the procedure including the risks, benefits and alternatives for the proposed anesthesia with the patient or authorized representative who has indicated his/her  understanding and acceptance.   Dental advisory given  Plan Discussed with: CRNA and Surgeon  Anesthesia Plan Comments:        Anesthesia Quick Evaluation                                   Anesthesia Evaluation  Patient identified by MRN, date of birth, ID band Patient awake    Reviewed: Allergy & Precautions, H&P , NPO status , Patient's Chart, lab work & pertinent test results  Airway Mallampati: III TM Distance: <3 FB Neck ROM: Full    Dental no notable dental hx.    Pulmonary neg pulmonary ROS,  breath sounds clear to auscultation  + decreased breath sounds       Cardiovascular hypertension, + CAD, + Past MI, + CABG and + Peripheral Vascular Disease Rhythm:Regular Rate:Normal     Neuro/Psych negative neurological ROS  negative psych ROS   GI/Hepatic negative GI ROS, Neg liver ROS,   Endo/Other  diabetes, Type 1, Insulin Dependent  Renal/GU negative Renal ROS  negative genitourinary   Musculoskeletal negative musculoskeletal ROS (+)   Abdominal   Peds negative pediatric ROS (+)  Hematology negative hematology ROS (+)   Anesthesia Other Findings   Reproductive/Obstetrics negative OB ROS                           Anesthesia Physical  Anesthesia Plan  ASA: III  Anesthesia Plan: General   Post-op Pain Management:    Induction: Intravenous  Airway Management Planned: Oral ETT and Video Laryngoscope Planned  Additional Equipment:   Intra-op Plan:   Post-operative Plan: Extubation in OR  Informed Consent: I have reviewed the patients History and Physical, chart, labs and discussed the procedure including the risks, benefits and alternatives for the proposed anesthesia with the patient or authorized representative who has indicated his/her understanding and acceptance.   Dental advisory given  Plan Discussed with: CRNA and Surgeon  Anesthesia Plan Comments: (SCB)        Anesthesia Quick Evaluation

## 2022-04-30 NOTE — Interval H&P Note (Signed)
History and Physical Interval Note:  04/30/2022 8:10 AM  [name]  has presented today for surgery, with the diagnosis of screening colonoscopy.  The various methods of treatment have been discussed with the patient and family. After consideration of risks, benefits and other options for treatment, the patient has consented to  Procedure(s): COLONOSCOPY WITH PROPOFOL (N/A) as a surgical intervention.  The patient's history has been reviewed, patient examined, no change in status, stable for surgery.  I have reviewed the patient's chart and labs.  Questions were answered to the patient's satisfaction.     Nelida Meuse III

## 2022-04-30 NOTE — Transfer of Care (Signed)
Immediate Anesthesia Transfer of Care Note  Patient: [name]  Procedure(s) Performed: COLONOSCOPY WITH PROPOFOL POLYPECTOMY HEMOSTASIS CLIP PLACEMENT  Patient Location: PACU  Anesthesia Type:MAC  Level of Consciousness: awake, alert  and oriented  Airway & Oxygen Therapy: Patient Spontanous Breathing and Patient connected to nasal cannula oxygen  Post-op Assessment: Report given to RN and Post -op Vital signs reviewed and stable  Post vital signs: Reviewed and stable  Last Vitals:  Vitals Value Taken Time  BP    Temp    Pulse 55 04/30/22 0938  Resp 20 04/30/22 0938  SpO2 96 % 04/30/22 0938  Vitals shown include unvalidated device data.  Last Pain:  Vitals:   04/30/22 0720  TempSrc: Temporal  PainSc: 0-No pain         Complications: No notable events documented.

## 2022-05-01 LAB — SURGICAL PATHOLOGY

## 2022-05-02 ENCOUNTER — Encounter (HOSPITAL_COMMUNITY): Payer: Self-pay | Admitting: Gastroenterology

## 2022-05-05 ENCOUNTER — Encounter: Payer: Self-pay | Admitting: Gastroenterology

## 2022-06-19 DIAGNOSIS — G4733 Obstructive sleep apnea (adult) (pediatric): Secondary | ICD-10-CM | POA: Diagnosis not present

## 2022-07-15 ENCOUNTER — Telehealth: Payer: Self-pay | Admitting: Internal Medicine

## 2022-07-15 DIAGNOSIS — I480 Paroxysmal atrial fibrillation: Secondary | ICD-10-CM

## 2022-07-15 MED ORDER — APIXABAN 5 MG PO TABS: 5.0000 mg | ORAL_TABLET | Freq: Two times a day (BID) | ORAL | 1 refills | Status: DC

## 2022-07-15 MED ORDER — APIXABAN 5 MG PO TABS: 5.0000 mg | ORAL_TABLET | Freq: Two times a day (BID) | ORAL | 5 refills | Status: DC

## 2022-07-15 NOTE — Addendum Note (Signed)
Addended by: Leonidas Romberg on: 07/15/2022 11:48 AM   Modules accepted: Orders

## 2022-07-15 NOTE — Telephone Encounter (Signed)
Prescription refill request for Eliquis received. Indication: Afib  Last office visit: 04/17/22 ([facility])  Scr: 1.57 (12/17/21)  Age: 66 Weight: 120.2kg  Appropriate dose and refill sent to requested pharmacy.

## 2022-07-15 NOTE — Telephone Encounter (Signed)
*  STAT* If patient is at the pharmacy, call can be transferred to refill team.   1. Which medications need to be refilled? (please list name of each medication and dose if known) new prescription for Eliquis- changing pharmacy  2. Which pharmacy/location (including street and city if local pharmacy) is medication to be sent to?[place], [place]  3. Do they need a 30 day or 90 day supply?  30 days

## 2022-07-17 ENCOUNTER — Other Ambulatory Visit: Payer: Self-pay | Admitting: Cardiovascular Disease

## 2022-07-21 DIAGNOSIS — S91301A Unspecified open wound, right foot, initial encounter: Secondary | ICD-10-CM | POA: Diagnosis not present

## 2022-07-24 DIAGNOSIS — S91301D Unspecified open wound, right foot, subsequent encounter: Secondary | ICD-10-CM | POA: Diagnosis not present

## 2022-08-06 DIAGNOSIS — E1151 Type 2 diabetes mellitus with diabetic peripheral angiopathy without gangrene: Secondary | ICD-10-CM | POA: Diagnosis not present

## 2022-08-06 DIAGNOSIS — E1142 Type 2 diabetes mellitus with diabetic polyneuropathy: Secondary | ICD-10-CM | POA: Diagnosis not present

## 2022-08-06 DIAGNOSIS — L89893 Pressure ulcer of other site, stage 3: Secondary | ICD-10-CM | POA: Diagnosis not present

## 2022-08-07 ENCOUNTER — Encounter: Payer: Medicare HMO | Admitting: *Deleted

## 2022-08-07 DIAGNOSIS — Z006 Encounter for examination for normal comparison and control in clinical research program: Secondary | ICD-10-CM

## 2022-08-07 MED ORDER — STUDY - ORION 4 - INCLISIRAN 300 MG/1.5 ML OR PLACEBO SQ INJECTION (PI-STUCKEY)
300.0000 mg | INJECTION | SUBCUTANEOUS | Status: DC
  Administered 2022-08-07: 300 mg via SUBCUTANEOUS
  Filled 2022-08-07: qty 1.5

## 2022-08-07 NOTE — Research (Signed)
[facility]   Patient doing well, no complaints.  . Ip given   Will see patient back in [facility]

## 2022-08-11 ENCOUNTER — Other Ambulatory Visit: Payer: Self-pay | Admitting: Internal Medicine

## 2022-08-13 DIAGNOSIS — E1142 Type 2 diabetes mellitus with diabetic polyneuropathy: Secondary | ICD-10-CM | POA: Diagnosis not present

## 2022-08-13 DIAGNOSIS — L89893 Pressure ulcer of other site, stage 3: Secondary | ICD-10-CM | POA: Diagnosis not present

## 2022-08-13 DIAGNOSIS — E1151 Type 2 diabetes mellitus with diabetic peripheral angiopathy without gangrene: Secondary | ICD-10-CM | POA: Diagnosis not present

## 2022-08-27 DIAGNOSIS — E1142 Type 2 diabetes mellitus with diabetic polyneuropathy: Secondary | ICD-10-CM | POA: Diagnosis not present

## 2022-08-27 DIAGNOSIS — E1151 Type 2 diabetes mellitus with diabetic peripheral angiopathy without gangrene: Secondary | ICD-10-CM | POA: Diagnosis not present

## 2022-08-27 DIAGNOSIS — L89893 Pressure ulcer of other site, stage 3: Secondary | ICD-10-CM | POA: Diagnosis not present

## 2022-08-31 ENCOUNTER — Encounter (HOSPITAL_BASED_OUTPATIENT_CLINIC_OR_DEPARTMENT_OTHER): Payer: Medicare HMO | Attending: General Surgery | Admitting: General Surgery

## 2022-08-31 DIAGNOSIS — I110 Hypertensive heart disease with heart failure: Secondary | ICD-10-CM | POA: Diagnosis not present

## 2022-08-31 DIAGNOSIS — Z7902 Long term (current) use of antithrombotics/antiplatelets: Secondary | ICD-10-CM | POA: Diagnosis not present

## 2022-08-31 DIAGNOSIS — I5032 Chronic diastolic (congestive) heart failure: Secondary | ICD-10-CM | POA: Diagnosis not present

## 2022-08-31 DIAGNOSIS — E1165 Type 2 diabetes mellitus with hyperglycemia: Secondary | ICD-10-CM | POA: Diagnosis not present

## 2022-08-31 DIAGNOSIS — Z86718 Personal history of other venous thrombosis and embolism: Secondary | ICD-10-CM | POA: Insufficient documentation

## 2022-08-31 DIAGNOSIS — E1151 Type 2 diabetes mellitus with diabetic peripheral angiopathy without gangrene: Secondary | ICD-10-CM | POA: Insufficient documentation

## 2022-08-31 DIAGNOSIS — L97512 Non-pressure chronic ulcer of other part of right foot with fat layer exposed: Secondary | ICD-10-CM | POA: Diagnosis not present

## 2022-08-31 DIAGNOSIS — I4891 Unspecified atrial fibrillation: Secondary | ICD-10-CM | POA: Diagnosis not present

## 2022-08-31 DIAGNOSIS — Z7901 Long term (current) use of anticoagulants: Secondary | ICD-10-CM | POA: Insufficient documentation

## 2022-08-31 DIAGNOSIS — I872 Venous insufficiency (chronic) (peripheral): Secondary | ICD-10-CM | POA: Diagnosis not present

## 2022-08-31 DIAGNOSIS — E11621 Type 2 diabetes mellitus with foot ulcer: Secondary | ICD-10-CM | POA: Diagnosis not present

## 2022-08-31 DIAGNOSIS — E1140 Type 2 diabetes mellitus with diabetic neuropathy, unspecified: Secondary | ICD-10-CM | POA: Diagnosis not present

## 2022-08-31 DIAGNOSIS — G4733 Obstructive sleep apnea (adult) (pediatric): Secondary | ICD-10-CM | POA: Insufficient documentation

## 2022-08-31 NOTE — Progress Notes (Signed)
[name], [place] A [id number]) [id number] Page 1 of 8 Visit Report for 08/31/2022 Allergy List Details Patient Name: Date of Service: [name], [name] RK A. 08/31/2022 12:30 PM Medical Record Number: [id number] Patient Account Number: [id number] Date of Birth/Sex: Treating RN: [birth year] (67 y.o. [name] Primary Care Amorina Doerr: Riki Sheer Other Clinician: Referring Jonpaul Lumm: Treating Orpha Dain/Extender: Maris Berger Weeks in Treatment: 0 Allergies Active Allergies No Known Drug Allergies Allergy Notes Electronic Signature(s) Signed: 08/31/2022 4:32:39 PM By: Adline Peals Entered By: Adline Peals on 08/31/2022 12:36:27 -------------------------------------------------------------------------------- Arrival Information Details Patient Name: Date of Service: [name] Caprice, MA [place] A. 08/31/2022 12:30 PM Medical Record Number: [id number] Patient Account Number: [id number] Date of Birth/Sex: Treating RN: 1955/09/04 (67 y.o. [name] Primary Care Marvin Maenza: Riki Sheer Other Clinician: Referring Courtland Coppa: Treating Leevon Upperman/Extender: Louann Sjogren in Treatment: 0 Visit Information Patient Arrived: Ambulatory Arrival Time: 12:30 Accompanied By: girlfriend Transfer Assistance: None Patient Identification Verified: Yes Secondary Verification Process Completed: Yes History Since Last Visit Electronic Signature(s) Signed: 08/31/2022 4:32:39 PM By: Adline Peals Entered By: Adline Peals on 08/31/2022 12:31:03 -------------------------------------------------------------------------------- Clinic Level of Care Assessment Details Patient Name: Date of Service: [name] A. 08/31/2022 12:30 PM Medical Record Number: [id number] Patient Account Number: [id number] Date of Birth/Sex: Treating RN: 1955-09-20 (67 y.o. [name] Primary Care Macy Polio: Riki Sheer  Other Clinician: Vanetta Shawl A ([id number]) [id number] Page 2 of 8 Referring Tiffney Haughton: Treating Caryssa Elzey/Extender: Louann Sjogren in Treatment: 0 Clinic Level of Care Assessment Items TOOL 1 Quantity Score X- 1 0 Use when EandM and Procedure is performed on INITIAL visit ASSESSMENTS - Nursing Assessment / Reassessment X- 1 20 General Physical Exam (combine w/ comprehensive assessment (listed just below) when performed on new pt. evals) X- 1 25 Comprehensive Assessment (HX, ROS, Risk Assessments, Wounds Hx, etc.) ASSESSMENTS - Wound and Skin Assessment / Reassessment []$  - 0 Dermatologic / Skin Assessment (not related to wound area) ASSESSMENTS - Ostomy and/or Continence Assessment and Care []$  - 0 Incontinence Assessment and Management []$  - 0 Ostomy Care Assessment and Management (repouching, etc.) PROCESS - Coordination of Care X - Simple Patient / Family Education for ongoing care 1 15 []$  - 0 Complex (extensive) Patient / Family Education for ongoing care X- 1 10 Staff obtains Programmer, systems, Records, T Results / Process Orders est []$  - 0 Staff telephones HHA, Nursing Homes / Clarify orders / etc []$  - 0 Routine Transfer to another Facility (non-emergent condition) []$  - 0 Routine Hospital Admission (non-emergent condition) X- 1 15 New Admissions / Biomedical engineer / Ordering NPWT Apligraf, etc. , []$  - 0 Emergency Hospital Admission (emergent condition) PROCESS - Special Needs []$  - 0 Pediatric / Minor Patient Management []$  - 0 Isolation Patient Management []$  - 0 Hearing / Language / Visual special needs []$  - 0 Assessment of Community assistance (transportation, D/C planning, etc.) []$  - 0 Additional assistance / Altered mentation []$  - 0 Support Surface(s) Assessment (bed, cushion, seat, etc.) INTERVENTIONS - Miscellaneous []$  - 0 External ear exam []$  - 0 Patient Transfer (multiple staff / Civil Service fast streamer / Similar  devices) []$  - 0 Simple Staple / Suture removal (25 or less) []$  - 0 Complex Staple / Suture removal (26 or more) []$  - 0 Hypo/Hyperglycemic Management (do not check if billed separately) X- 1 15 Ankle / Brachial Index (ABI) - do not check if billed separately Has the patient been seen at the hospital within the last three years: Yes Total Score: 100  Level Of Care: New/Established - Level 3 Electronic Signature(s) Signed: 08/31/2022 4:32:39 PM By: Adline Peals Entered By: Adline Peals on 08/31/2022 13:44:57 Encounter Discharge Information Details -------------------------------------------------------------------------------- Vanetta Shawl A ([id number].pdf Page 3 of 8 Patient Name: Date of Service: [name], [name] RK A. 08/31/2022 12:30 PM Medical Record Number: [id number] Patient Account Number: [id number] Date of Birth/Sex: Treating RN: 1956/01/14 (67 y.o. [name] Primary Care Gordana Kewley: Riki Sheer Other Clinician: Referring Rhina Kramme: Treating Laraya Pestka/Extender: Louann Sjogren in Treatment: 0 Encounter Discharge Information Items Post Procedure Vitals Discharge Condition: Stable Temperature (F): 98.1 Ambulatory Status: Ambulatory Pulse (bpm): 77 Discharge Destination: Home Respiratory Rate (breaths/min): 20 Transportation: Private Auto Blood Pressure (mmHg): 171/78 Accompanied By: girlfriend Schedule Follow-up Appointment: Yes Clinical Summary of Care: Patient Declined Electronic Signature(s) Signed: 08/31/2022 4:32:39 PM By: Adline Peals Entered By: Adline Peals on 08/31/2022 13:45:47 -------------------------------------------------------------------------------- Lower Extremity Assessment Details Patient Name: Date of Service: [name], [name] [place] A. 08/31/2022 12:30 PM Medical Record Number: [id number] Patient Account Number: [id number] Date of Birth/Sex: Treating RN: [birth year] (67  y.o. [name] Primary Care Meya Clutter: Riki Sheer Other Clinician: Referring Guy Seese: Treating Ilani Otterson/Extender: Louann Sjogren in Treatment: 0 Edema Assessment Assessed: Shirlyn Goltz: No] [Right: No] [Left: Edema] [Right: :] Calf Left: Right: Point of Measurement: From Medial Instep 43 cm Ankle Left: Right: Point of Measurement: From Medial Instep 28.1 cm Vascular Assessment Pulses: Dorsalis Pedis Palpable: [Right:No] Blood Pressure: Brachial: [Right:171] Ankle: [Right:Posterior Tibial: 130 0.76] Electronic Signature(s) Signed: 08/31/2022 4:32:39 PM By: Adline Peals Entered By: Adline Peals on 08/31/2022 13:00:43 [place], [place] A ([id number] Page 4 of 8 -------------------------------------------------------------------------------- Multi Wound Chart Details Patient Name: Date of Service: [name], [name] RK A. 08/31/2022 12:30 PM Medical Record Number: [id number] Patient Account Number: [id number] Date of Birth/Sex: Treating RN: 1956/03/13 (67 y.o. M) Primary Care Rheannon Cerney: Riki Sheer Other Clinician: Referring Clinton Dragone: Treating Mayli Covington/Extender: Louann Sjogren in Treatment: 0 Vital Signs Height(in): 70 Capillary Blood Glucose(mg/dl): 163 Weight(lbs): 260 Pulse(bpm): 25 Body Mass Index(BMI): 37.3 Blood Pressure(mmHg): 171/78 Temperature(F): 98.1 Respiratory Rate(breaths/min): 20 [2:Photos:] [N/A:N/A] Right, Plantar Foot N/A N/A Wound Location: Gradually Appeared N/A N/A Wounding Event: Diabetic Wound/Ulcer of the Lower N/A N/A Primary Etiology: Extremity Sleep Apnea, Arrhythmia, Coronary N/A N/A Comorbid History: Artery Disease, Deep Vein Thrombosis, Hypertension, Myocardial Infarction, Peripheral Arterial Disease, Type II Diabetes, Neuropathy 07/08/2022 N/A N/A Date Acquired: 0 N/A N/A Weeks of Treatment: Open N/A N/A Wound Status: No  N/A N/A Wound Recurrence: 1.3x2.5x0.1 N/A N/A Measurements L x W x D (cm) 2.553 N/A N/A A (cm) : rea 0.255 N/A N/A Volume (cm) : Grade 1 N/A N/A Classification: Medium N/A N/A Exudate A mount: Serosanguineous N/A N/A Exudate Type: red, brown N/A N/A Exudate Color: Distinct, outline attached N/A N/A Wound Margin: Large (67-100%) N/A N/A Granulation A mount: Red N/A N/A Granulation Quality: Small (1-33%) N/A N/A Necrotic A mount: Eschar, Adherent Slough N/A N/A Necrotic Tissue: Fat Layer (Subcutaneous Tissue): Yes N/A N/A Exposed Structures: Fascia: No Tendon: No Muscle: No Joint: No Bone: No None N/A N/A Epithelialization: Debridement - Excisional N/A N/A Debridement: Pre-procedure Verification/Time Out 13:01 N/A N/A Taken: Lidocaine 4% Topical Solution N/A N/A Pain Control: Callus, Subcutaneous N/A N/A Tissue Debrided: Skin/Subcutaneous Tissue N/A N/A Level: 3.25 N/A N/A Debridement A (sq cm): rea Curette N/A N/A Instrument: Minimum N/A N/A Bleeding: Pressure N/A N/A Hemostasis A chieved: Procedure was tolerated well N/A N/A Debridement Treatment Response: 1.3x2.5x0.1 N/A N/A Post Debridement Measurements L x W x  D (cm) 0.255 N/A N/A Post Debridement Volume: (cm) Callus: Yes N/A N/A Periwound Skin Texture: No Abnormalities Noted N/A N/A Periwound Skin Moisture: No Abnormalities Noted N/A N/A Periwound Skin Color: No Abnormality N/A N/A Temperature: Debridement N/A N/A Procedures Performed: Treatment Notes Appelt, Zebastian A ([id number].pdf Page 5 of 8 Electronic Signature(s) Signed: 08/31/2022 1:31:12 PM By: Fredirick Maudlin MD FACS Previous Signature: 08/31/2022 12:58:51 PM Version By: Fredirick Maudlin MD FACS Entered By: Fredirick Maudlin on 08/31/2022 13:31:12 -------------------------------------------------------------------------------- Multi-Disciplinary Care Plan Details Patient Name: Date of  Service: [name], [name] [place] A. 08/31/2022 12:30 PM Medical Record Number: [id number] Patient Account Number: [id number] Date of Birth/Sex: Treating RN: Oct 25, 1955 (68 y.o. [name] Primary Care Zeno Hickel: Riki Sheer Other Clinician: Referring Shontel Santee: Treating Kharlie Bring/Extender: Louann Sjogren in Treatment: 0 Active Inactive Abuse / Safety / Falls / Self Care Management Nursing Diagnoses: Impaired physical mobility Potential for falls Goals: Patient will not experience any injury related to falls Date Initiated: 08/31/2022 Target Resolution Date: 10/23/2022 Goal Status: Active Patient/caregiver will verbalize/demonstrate measures taken to improve the patient's personal safety Date Initiated: 08/31/2022 Target Resolution Date: 10/23/2022 Goal Status: Active Interventions: Provide education on basic hygiene Provide education on fall prevention Notes: Wound/Skin Impairment Nursing Diagnoses: Impaired tissue integrity Knowledge deficit related to ulceration/compromised skin integrity Goals: Patient/caregiver will verbalize understanding of skin care regimen Date Initiated: 08/31/2022 Target Resolution Date: 10/23/2022 Goal Status: Active Interventions: Assess patient/caregiver ability to obtain necessary supplies Assess patient/caregiver ability to perform ulcer/skin care regimen upon admission and as needed Assess ulceration(s) every visit Treatment Activities: Skin care regimen initiated : 08/31/2022 Topical wound management initiated : 08/31/2022 Notes: Electronic Signature(s) Signed: 08/31/2022 4:32:39 PM By: Adline Peals Entered By: Adline Peals on 08/31/2022 13:43:33 [name], [place] ([id number] Page 6 of 8 -------------------------------------------------------------------------------- Pain Assessment Details Patient Name: Date of Service: [name], [name] RK A. 08/31/2022 12:30 PM Medical Record  Number: [id number] Patient Account Number: [id number] Date of Birth/Sex: Treating RN: 07/03/1956 (67 y.o. [name] Primary Care Stefania Goulart: Riki Sheer Other Clinician: Referring Tierre Gerard: Treating Odelia Graciano/Extender: Louann Sjogren in Treatment: 0 Active Problems Location of Pain Severity and Description of Pain Patient Has Paino No Site Locations Rate the pain. Current Pain Level: 0 Pain Management and Medication Current Pain Management: Electronic Signature(s) Signed: 08/31/2022 4:32:39 PM By: Adline Peals Entered By: Adline Peals on 08/31/2022 12:52:15 -------------------------------------------------------------------------------- Patient/Caregiver Education Details Patient Name: Date of Service: [name] 2/12/2024andnbsp12:30 PM Medical Record Number: [id number] Patient Account Number: [id number] Date of Birth/Gender: Treating RN: March 09, 1956 (67 y.o. [name] Primary Care Physician: Riki Sheer Other Clinician: Referring Physician: Treating Physician/Extender: Louann Sjogren in Treatment: 0 Education Assessment Education Provided To: Patient Education Topics Provided Wound/Skin Impairment: Methods: Explain/Verbal Responses: Reinforcements needed, State content correctly [place], [place] ([id number]) [id number] Page 7 of 8 Electronic Signature(s) Signed: 08/31/2022 4:32:39 PM By: Adline Peals Entered By: Adline Peals on 08/31/2022 13:44:15 -------------------------------------------------------------------------------- Wound Assessment Details Patient Name: Date of Service: [name], [name] [place] A. 08/31/2022 12:30 PM Medical Record Number: [id number] Patient Account Number: [id number] Date of Birth/Sex: Treating RN: 08-23-1955 (67 y.o. [name] Primary Care Eddie Payette: Riki Sheer Other Clinician: Referring  Aleynah Rocchio: Treating Ulmer Degen/Extender: Louann Sjogren in Treatment: 0 Wound Status Wound Number: 2 Primary Diabetic Wound/Ulcer of the Lower Extremity Etiology: Wound Location: Right, Plantar Foot Wound Open Wounding Event: Gradually Appeared Status: Date Acquired: 07/08/2022 Comorbid Sleep Apnea, Arrhythmia, Coronary Artery Disease, Deep Vein Weeks Of  Treatment: 0 History: Thrombosis, Hypertension, Myocardial Infarction, Peripheral Arterial Clustered Wound: No Disease, Type II Diabetes, Neuropathy Photos Wound Measurements Length: (cm) 1.3 Width: (cm) 2.5 Depth: (cm) 0.1 Area: (cm) 2.553 Volume: (cm) 0.255 % Reduction in Area: % Reduction in Volume: Epithelialization: None Tunneling: No Undermining: No Wound Description Classification: Grade 1 Wound Margin: Distinct, outline attached Exudate Amount: Medium Exudate Type: Serosanguineous Exudate Color: red, brown Foul Odor After Cleansing: No Slough/Fibrino Yes Wound Bed Granulation Amount: Large (67-100%) Exposed Structure Granulation Quality: Red Fascia Exposed: No Necrotic Amount: Small (1-33%) Fat Layer (Subcutaneous Tissue) Exposed: Yes Necrotic Quality: Eschar, Adherent Slough Tendon Exposed: No Muscle Exposed: No Joint Exposed: No Bone Exposed: No Periwound Skin Texture Texture Color No Abnormalities Noted: No No Abnormalities Noted: Yes Callus: Yes Temperature / Pain Temperature: No Abnormality Moisture Swire, Halbert A ([id number].pdf Page 8 of 8 No Abnormalities Noted: Yes Treatment Notes Wound #2 (Foot) Wound Laterality: Plantar, Right Cleanser Soap and Water Discharge Instruction: May shower and wash wound with dial antibacterial soap and water prior to dressing change. Wound Cleanser Discharge Instruction: Cleanse the wound with wound cleanser prior to applying a clean dressing using gauze sponges, not tissue or cotton  balls. Peri-Wound Care Topical Primary Dressing Sorbalgon AG Dressing 2x2 (in/in) Discharge Instruction: Apply to wound bed as instructed Secondary Dressing Optifoam Non-Adhesive Dressing, 4x4 in Discharge Instruction: Apply over primary dressing as directed. Woven Gauze Sponge, Non-Sterile 4x4 in Discharge Instruction: Apply over primary dressing as directed. Secured With Elastic Bandage 4 inch (ACE bandage) Discharge Instruction: Secure with ACE bandage as directed. Kerlix Roll Sterile, 4.5x3.1 (in/yd) Discharge Instruction: Secure with Kerlix as directed. Compression Wrap Compression Stockings Add-Ons Electronic Signature(s) Signed: 08/31/2022 4:32:39 PM By: Adline Peals Entered By: Adline Peals on 08/31/2022 12:54:22 -------------------------------------------------------------------------------- Vitals Details Patient Name: Date of Service: [name] Caprice, MA [place] A. 08/31/2022 12:30 PM Medical Record Number: [id number] Patient Account Number: [id number] Date of Birth/Sex: Treating RN: 1956-05-03 (67 y.o. [name] Primary Care Tiburcio Linder: Riki Sheer Other Clinician: Referring Clotiel Troop: Treating Azai Gaffin/Extender: Louann Sjogren in Treatment: 0 Vital Signs Time Taken: 12:32 Temperature (F): 98.1 Height (in): 70 Pulse (bpm): 77 Source: Stated Respiratory Rate (breaths/min): 20 Weight (lbs): 260 Blood Pressure (mmHg): 171/78 Source: Stated Capillary Blood Glucose (mg/dl): 163 Body Mass Index (BMI): 37.3 Reference Range: 80 - 120 mg / dl Electronic Signature(s) Signed: 08/31/2022 4:32:39 PM By: Adline Peals Entered By: Adline Peals on 08/31/2022 12:36:15

## 2022-08-31 NOTE — Progress Notes (Signed)
[name], [name] ([id number]) [phone].pdf Page 1 of 4 Visit Report for 08/31/2022 Abuse Risk Screen Details Patient Name: Date of Service: [name]. 08/31/2022 12:30 PM Medical Record Number: [id number] Patient Account Number: [id number] Date of Birth/Sex: Treating RN: June 15, 1956 (67 y.o. [name] Adri Schloss: [name] Other Clinician: Referring [name]: Treating [name]/Extender: [name]: 0 Abuse Risk Screen Items Answer ABUSE RISK SCREEN: Has anyone close to you tried to hurt or harm you recentlyo No Do you feel uncomfortable with anyone in your familyo No Has anyone forced you do things that you didnt want to doo No Electronic Signature(s) Signed: 08/31/2022 4:32:39 PM By: [name] Entered By: [name] on 08/31/2022 12:42:52 -------------------------------------------------------------------------------- Activities of Daily Living Details Patient Name: Date of Service: [name], [name]. 08/31/2022 12:30 PM Medical Record Number: [id number] Patient Account Number: [id number] Date of Birth/Sex: Treating RN: Oct 23, 1955 (67 y.o. [name] Kasmira Cacioppo: [name] Other Clinician: Referring Rai Severns: Treating [name]/Extender: [name]: 0 Activities of Daily Living Items Answer Activities of Daily Living (Please select one for each item) Drive Automobile Completely Able T Medications ake Need Assistance Use T elephone Need Assistance [name] for Appearance Need Assistance Use T oilet Need Assistance Bath / Shower Need Assistance Dress Self Need Assistance Feed Self Completely Able Walk Need Assistance Get In / Out Bed Need Assistance Housework Not Able Prepare Meals Not Able Handle Money Not Able Shop for Self Not Able Electronic Signature(s) Signed: 08/31/2022 4:32:39 PM  By: [name] Entered By: [name] on 08/31/2022 12:43:40 [place], [place] ([id number]) [id number] Page 2 of 4 -------------------------------------------------------------------------------- Education Screening Details Patient Name: Date of Service: [name], [name]. 08/31/2022 12:30 PM Medical Record Number: [id number] Patient Account Number: [id number] Date of Birth/Sex: Treating RN: 1956/06/30 (67 y.o. [name] Areesha Dehaven: [name] Other Clinician: Referring Carlyann Placide: Treating [name]/Extender: [name]: 0 Primary Learner Assessed: Patient Learning Preferences/Education Level/Primary Language Learning Preference: Explanation, Demonstration, Video, Printed Material Highest Education Level: High School Preferred Language: English Cognitive Barrier Language Barrier: No Translator Needed: No Memory Deficit: No Emotional Barrier: No Cultural/Religious Beliefs Affecting Medical [name]: No Physical Barrier Impaired Vision: No Impaired Hearing: No Decreased Hand dexterity: No Knowledge/Comprehension Knowledge Level: Medium Comprehension Level: Medium Ability to understand written instructions: Medium Ability to understand verbal instructions: Medium Motivation Anxiety Level: Calm Cooperation: Cooperative Education Importance: Acknowledges Need Interest in Health Problems: Asks Questions Perception: Coherent Willingness to Engage in Self-Management Medium Activities: Readiness to Engage in Self-Management Medium Activities: Electronic Signature(s) Signed: 08/31/2022 4:32:39 PM By: [name] Entered By: [name] on 08/31/2022 12:44:18 -------------------------------------------------------------------------------- Fall Risk Assessment Details Patient Name: Date of Service: [name], [name]. 08/31/2022 12:30 PM Medical Record Number:  [id number] Patient Account Number: [id number] Date of Birth/Sex: Treating RN: 29-May-1956 (66 y.o. [name] Nycole Kawahara: [name] Other Clinician: Referring [name]: Treating [name]/Extender: [name]: 0 Fall Risk Assessment Items Have you had 2 or more falls in the last 12 monthso 0 No [name] ([id number]) [phone] [id number] Page 3 of 4 Have you had any fall that resulted in injury in the last 12 monthso 0 No FALLS RISK SCREEN History of falling - immediate or within 3 months 0 No Secondary diagnosis (Do you have 2 or more medical diagnoseso) 15 Yes Ambulatory aid None/bed rest/wheelchair/nurse 0 Yes Crutches/cane/walker 0  No Furniture 0 No Intravenous therapy Access/Saline/Heparin Lock 0 No Gait/Transferring Normal/ bed rest/ wheelchair 0 Yes Weak (short steps with or without shuffle, stooped but able to lift head while walking, may seek 0 No support from furniture) Impaired (short steps with shuffle, may have difficulty arising from chair, head down, impaired 0 No balance) Mental Status Oriented to own ability 0 Yes Electronic Signature(s) Signed: 08/31/2022 4:32:39 PM By: [name] Entered By: [name] on 08/31/2022 12:44:33 -------------------------------------------------------------------------------- Foot Assessment Details Patient Name: Date of Service: [name], [name]. 08/31/2022 12:30 PM Medical Record Number: [id number] Patient Account Number: [id number] Date of Birth/Sex: Treating RN: 1955-10-05 (67 y.o. [name] Abimbola Aki: [name] Other Clinician: Referring [name]: Treating [name]/Extender: [name]: 0 Foot Assessment Items Site Locations + = Sensation present, - = Sensation absent, C = Callus, U = Ulcer R = Redness, W = Warmth, M = Maceration, PU =  Pre-ulcerative lesion F = Fissure, S = Swelling, D = Dryness Assessment Right: Left: Other Deformity: No No Prior Foot Ulcer: No No Prior Amputation: No No Charcot Joint: No No Ambulatory Status: Ambulatory Without Help Gait: Steady [name], [name] ([id number]) [id number] Page 4 of 4 Electronic Signature(s) Signed: 08/31/2022 4:32:39 PM By: [name] Entered By: [name] on 08/31/2022 12:47:20 -------------------------------------------------------------------------------- Nutrition Risk Screening Details Patient Name: Date of Service: [name], [name]. 08/31/2022 12:30 PM Medical Record Number: [id number] Patient Account Number: [id number] Date of Birth/Sex: Treating RN: 29-Jul-1955 (67 y.o. [name] Mizael Sagar: [name] Other Clinician: Referring Erionna Strum: Treating Jashad Depaula/Extender: [name]: 0 Height (in): 70 Weight (lbs): 260 Body Mass Index (BMI): 37.3 Nutrition Risk Screening Items Score Screening NUTRITION RISK SCREEN: I have an illness or condition that made me change the kind and/or amount of food I eat 0 No I eat fewer than two meals per day 0 No I eat few fruits and vegetables, or milk products 0 No I have three or more drinks of beer, liquor or wine almost every day 0 No I have tooth or mouth problems that make it hard for me to eat 0 No I don't always have enough money to buy the food I need 0 No I eat alone most of the time 0 No I take three or more different prescribed or over-the-counter drugs [name] day 1 Yes Without wanting to, I have lost or gained 10 pounds in the last six months 0 No I am not always physically able to shop, cook and/or feed myself 0 No Nutrition Protocols Good Risk Protocol 0 No interventions needed Moderate Risk Protocol High Risk Proctocol Risk Level: Good Risk Score: 1 Electronic Signature(s) Signed: 08/31/2022  4:32:39 PM By: [name] Entered By: [name] on 08/31/2022 12:45:08

## 2022-08-31 NOTE — Progress Notes (Addendum)
Langlois, Kathleen A ([id number]) [id number] Page 1 of 10 Visit Report for 08/31/2022 Chief Complaint Document Details Patient Name: Date of Service: [place], [place] RK A. 08/31/2022 12:30 PM Medical Record Number: [id number] Patient Account Number: [id number] Date of Birth/Sex: Treating RN: January 10, 1956 (67 y.o. M) Primary Care Provider: Riki Sheer Other Clinician: Referring Provider: Treating Provider/Extender: Louann Sjogren in Treatment: 0 Information Obtained from: Patient Chief Complaint 05/14/2020; patient is here for review of an abrasion injury on the left lateral calf 08/31/2022: [name] (1st metatarsal base, plantar) Electronic Signature(s) Signed: 08/31/2022 12:59:32 PM By: Fredirick Maudlin MD FACS Entered By: Fredirick Maudlin on 08/31/2022 12:59:32 -------------------------------------------------------------------------------- Debridement Details Patient Name: Date of Service: Isabel Caprice, MA [place] A. 08/31/2022 12:30 PM Medical Record Number: [id number] Patient Account Number: [id number] Date of Birth/Sex: Treating RN: 20-May-1956 (67 y.o. [name] Primary Care Provider: Riki Sheer Other Clinician: Referring Provider: Treating Provider/Extender: Louann Sjogren in Treatment: 0 Debridement Performed for Assessment: Wound #2 [place] Performed By: Physician Fredirick Maudlin, MD Debridement Type: Debridement Severity of Tissue Pre Debridement: Fat layer exposed Level of Consciousness (Pre-procedure): Awake and Alert Pre-procedure Verification/Time Out Yes - 13:01 Taken: Start Time: 13:01 Pain Control: Lidocaine 4% T opical Solution T Area Debrided (L x W): otal 1.3 (cm) x 2.5 (cm) = 3.25 (cm) Tissue and other material debrided: Non-Viable, Callus, Subcutaneous, Skin: Epidermis Level: Skin/Subcutaneous Tissue Debridement Description: Excisional Instrument:  Curette Bleeding: Minimum Hemostasis Achieved: Pressure Response to Treatment: Procedure was tolerated well Level of Consciousness (Post- Awake and Alert procedure): Post Debridement Measurements of Total Wound Length: (cm) 1.3 Width: (cm) 2.5 Depth: (cm) 0.1 Volume: (cm) 0.255 Character of Wound/Ulcer Post Debridement: Improved Severity of Tissue Post Debridement: Fat layer exposed Malina, Tandy A ([id number].pdf Page 2 of 10 Post Procedure Diagnosis Same as Pre-procedure Notes scribed for Dr. Celine Ahr by Adline Peals, RN Electronic Signature(s) Signed: 08/31/2022 1:45:22 PM By: Fredirick Maudlin MD FACS Signed: 08/31/2022 4:32:39 PM By: Adline Peals Entered By: Adline Peals on 08/31/2022 13:07:00 -------------------------------------------------------------------------------- HPI Details Patient Name: Date of Service: Isabel Caprice, [place] A. 08/31/2022 12:30 PM Medical Record Number: [id number] Patient Account Number: [id number] Date of Birth/Sex: Treating RN: Oct 20, 1955 (67 y.o. M) Primary Care Provider: Riki Sheer Other Clinician: Referring Provider: Treating Provider/Extender: Louann Sjogren in Treatment: 0 History of Present Illness HPI Description: ADMISSION 05/14/2020; this is a 68 year old man with multiple medical issues. Predominantly he has type 2 diabetes with a history of peripheral neuropathy and also history of fairly significant PAD. He had a left superficial femoral to posterior tibial artery bypass in February 2017 he also had an atherectomy and angioplasty by Dr. Gwenlyn Found of the right popliteal artery in 2016. He is supposed to be getting arterial studies annually however this was interrupted last year because of the pandemic. He tells [place] he was at [facility] 2 weeks ago was getting out of of the scooter and traumatized his left lateral lower leg. There was a lot of bleeding as the patient is  on Plavix and Eliquis. They have been dressing this with Neosporin and doing a fairly good job. Wound measures 2.5 x 3.5 it does not have any depth he does not have a wound history in his legs outside of surgery however he does have chronic edema and skin changes suggestive of chronic venous disease possibly some degree of lymphedema as well. Past medical history includes type 2 diabetes with peripheral neuropathy and gait instability, lumbar spondylosis, obstructive sleep  apnea, history of a left pontine CVA, basal cell skin cancer, atrial fibrillation on anticoagulation, significant PAD as noted with a left superficial femoral to posterior tibial bypass in February 2017 and a right popliteal atherectomy and angioplasty by Dr. Gwenlyn Found in 30th 2016. He also has a history of coronary artery disease with an MI in 2002 hypertension hyperlipidemia and heart failure with preserved ejection fraction His last arterial studies I can see in epic were on 03/10/2018 this showed a right ABI of 0.69 and a right TBI of 0.5 with monophasic waveforms on the right. On the left his ABI was 1.20 with a TBI of 0.92 and triphasic waveforms. He has not had arterial studies since. Our nurse in the clinic got an ABI on the left of 1.1 11/2; left anterior leg wound in the setting of chronic venous insufficiency. Wound was initially trauma. We have been using Hydrofera Blue under compression he has home health.. The wound looks a lot better today with improvement in surface area 11/16; left anterior leg wound in the setting of chronic venous insufficiency. Wound was initially trauma. We have been using Hydrofera Blue under compression. The patient is closed today. He is supposed to follow-up with vein and vascular with regards to arterial insufficiency nevertheless his leg wounds are 12/3; apparently 2 weeks ago when they were putting on their stockings they managed to get 3 wounds on the left anterior lower leg from abrasion when  putting on the stockings. Home health came by the week of Thanksgiving and put Hydrofera Blue 4-layer wrap on this and there is only one superficial area remaining. The patient and his wife complained about the difficulties getting stockings on I think we are using 20/30. We will order bilateral external compression stockings which should be easier. 12/10; wound on the left anterior lower leg is closed. He has chronic venous insufficiency we ordered him Farrow wrap stockings unfortunately he did not bring these in. 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 He says that it has been present since mid December. He is currently residing in [facility] until March and a podiatrist has been looking after him there. They have been simply painting the area with Betadine. He has not had any lower extremity arterial studies since 2019, at which time his right ABI was 0.69. Measured in clinic today, it was 0.71. He is not aware of his most recent hemoglobin A1c, but historically he has had exceptionally poor control. On the basis of his right first metatarsal, there is a small crescent shaped wound. There is surrounding eschar and callus. There is no malodor or purulent drainage. Electronic Signature(s) Signed: 08/31/2022 1:31:38 PM By: Fredirick Maudlin MD FACS Entered By: Fredirick Maudlin on 08/31/2022 13:31:38 [place], [place] A ([id number] Page 3 of 10 -------------------------------------------------------------------------------- Physical Exam Details Patient Name: Date of Service: [place], [place] RK A. 08/31/2022 12:30 PM Medical Record Number: [id number] Patient Account Number: [id number] Date of Birth/Sex: Treating RN: August 06, 1955 (67 y.o. M) Primary Care Provider: Riki Sheer Other Clinician: Referring Provider: Treating Provider/Extender: Louann Sjogren in  Treatment: 0 Constitutional Hypertensive, asymptomatic. . . . No acute distress. Respiratory Normal work of breathing on room air. Cardiovascular 2+ pitting edema bilaterally. Notes 08/31/2022: On the basis of his right first metatarsal, there is a small crescent shaped wound. There is surrounding eschar and callus. There is no malodor or purulent drainage. Electronic Signature(s) Signed: 08/31/2022 1:32:25 PM  By: Fredirick Maudlin MD FACS Entered By: Fredirick Maudlin on 08/31/2022 13:32:25 -------------------------------------------------------------------------------- Physician Orders Details Patient Name: Date of Service: [place], MA [place] A. 08/31/2022 12:30 PM Medical Record Number: [id number] Patient Account Number: [id number] Date of Birth/Sex: Treating RN: October 16, 1955 (67 y.o. [name] Primary Care Provider: Riki Sheer Other Clinician: Referring Provider: Treating Provider/Extender: Louann Sjogren in Treatment: 0 Verbal / Phone Orders: No Diagnosis Coding ICD-10 Coding Code Description [phone] Non-pressure chronic ulcer of other part of right foot with fat layer exposed I73.9 Peripheral vascular disease, unspecified I25.10 Atherosclerotic heart disease of native coronary artery without angina pectoris I10 Essential (primary) hypertension E11.65 Type 2 diabetes mellitus with hyperglycemia E11.40 Type 2 diabetes mellitus with diabetic neuropathy, unspecified I63.9 Cerebral infarction, unspecified Follow-up Appointments ppointment in 1 week. - Dr. Celine Ahr - room 2 Return A Anesthetic (In clinic) Topical Lidocaine 4% applied to wound bed Bathing/ Shower/ Hygiene May shower and wash wound with soap and water. Edema Control - Lymphedema / SCD / Other Tillman, [place] A ([id number]) [phone].pdf Page 4 of 10 Elevate legs to the level of the heart or above for 30 minutes daily and/or when sitting for 3-4 times a day  throughout the day. Avoid standing for long periods of time. Patient to wear own compression stockings every day. Moisturize legs daily. Off-Loading Other: - forefront offloading shoe Wound Treatment Wound #2 - Foot Wound Laterality: Plantar, Right Cleanser: Soap and Water 1 x Per Day/30 Days Discharge Instructions: May shower and wash wound with dial antibacterial soap and water prior to dressing change. Cleanser: Wound Cleanser 1 x Per Day/30 Days Discharge Instructions: Cleanse the wound with wound cleanser prior to applying a clean dressing using gauze sponges, not tissue or cotton balls. Prim Dressing: Sorbalgon AG Dressing 2x2 (in/in) 1 x Per Day/30 Days ary Discharge Instructions: Apply to wound bed as instructed Secondary Dressing: Optifoam Non-Adhesive Dressing, 4x4 in 1 x Per Day/30 Days Discharge Instructions: Apply over primary dressing as directed. Secondary Dressing: Woven Gauze Sponge, Non-Sterile 4x4 in 1 x Per Day/30 Days Discharge Instructions: Apply over primary dressing as directed. Secured With: Elastic Bandage 4 inch (ACE bandage) 1 x Per Day/30 Days Discharge Instructions: Secure with ACE bandage as directed. Secured With: [employer], 4.5x3.1 (in/yd) 1 x Per Day/30 Days Discharge Instructions: Secure with Kerlix as directed. Patient Medications llergies: No Known Drug Allergies A Notifications Medication Indication Start End 08/31/2022 lidocaine DOSE topical 4 % cream - cream topical Electronic Signature(s) Signed: 08/31/2022 1:45:22 PM By: Fredirick Maudlin MD FACS Entered By: Fredirick Maudlin on 08/31/2022 13:33:43 -------------------------------------------------------------------------------- Problem List Details Patient Name: Date of Service: Isabel Caprice, MA [place] A. 08/31/2022 12:30 PM Medical Record Number: [id number] Patient Account Number: [id number] Date of Birth/Sex: Treating RN: [birth year] (67 y.o. M) Primary Care Provider: Riki Sheer Other  Clinician: Referring Provider: Treating Provider/Extender: Louann Sjogren in Treatment: 0 Active Problems ICD-10 Encounter Code Description Active Date MDM Diagnosis L97.512 Non-pressure chronic ulcer of other part of right foot with fat layer exposed 08/31/2022 No Yes I73.9 Peripheral vascular disease, unspecified 08/31/2022 No Yes Eitzen, [place] A ([id number]) [phone].pdf Page 5 of 10 I25.10 Atherosclerotic heart disease of native coronary artery without angina pectoris 08/31/2022 No Yes I10 Essential (primary) hypertension 08/31/2022 No Yes E11.65 Type 2 diabetes mellitus with hyperglycemia 08/31/2022 No Yes E11.40 Type 2 diabetes mellitus with diabetic neuropathy, unspecified 08/31/2022 No Yes I63.9 Cerebral infarction, unspecified 08/31/2022 No Yes Inactive Problems Resolved Problems Electronic Signature(s) Signed: 08/31/2022 12:58:35 PM  By: Fredirick Maudlin MD FACS Previous Signature: 08/31/2022 12:29:47 PM Version By: Fredirick Maudlin MD FACS Entered By: Fredirick Maudlin on 08/31/2022 12:58:35 -------------------------------------------------------------------------------- Progress Note Details Patient Name: Date of Service: Isabel Caprice, MA [place] A. 08/31/2022 12:30 PM Medical Record Number: [id number] Patient Account Number: [id number] Date of Birth/Sex: Treating RN: 04/19/1956 (67 y.o. M) Primary Care Provider: Riki Sheer Other Clinician: Referring Provider: Treating Provider/Extender: Louann Sjogren in Treatment: 0 Subjective Chief Complaint Information obtained from Patient 05/14/2020; patient is here for review of an abrasion injury on the left lateral calf 08/31/2022: [name] (1st metatarsal base, plantar) History of Present Illness (HPI) ADMISSION 05/14/2020; this is a 67 year old man with multiple medical issues. Predominantly he has type 2 diabetes with a history of peripheral neuropathy and  also history of fairly significant PAD. He had a left superficial femoral to posterior tibial artery bypass in February 2017 he also had an atherectomy and angioplasty by Dr. Gwenlyn Found of the right popliteal artery in 2016. He is supposed to be getting arterial studies annually however this was interrupted last year because of the pandemic. He tells [place] he was at [facility] 2 weeks ago was getting out of of the scooter and traumatized his left lateral lower leg. There was a lot of bleeding as the patient is on Plavix and Eliquis. They have been dressing this with Neosporin and doing a fairly good job. Wound measures 2.5 x 3.5 it does not have any depth he does not have a wound history in his legs outside of surgery however he does have chronic edema and skin changes suggestive of chronic venous disease possibly some degree of lymphedema as well. Past medical history includes type 2 diabetes with peripheral neuropathy and gait instability, lumbar spondylosis, obstructive sleep apnea, history of a left pontine CVA, basal cell skin cancer, atrial fibrillation on anticoagulation, significant PAD as noted with a left superficial femoral to posterior tibial bypass in February 2017 and a right popliteal atherectomy and angioplasty by Dr. Gwenlyn Found in 30th 2016. He also has a history of coronary artery disease with an MI in 2002 hypertension hyperlipidemia and heart failure with preserved ejection fraction His last arterial studies I can see in epic were on 03/10/2018 this showed a right ABI of 0.69 and a right TBI of 0.5 with monophasic waveforms on the right. On the left his ABI was 1.20 with a TBI of 0.92 and triphasic waveforms. He has not had arterial studies since. Our nurse in the clinic got an ABI on the left of 1.1 11/2; left anterior leg wound in the setting of chronic venous insufficiency. Wound was initially trauma. We have been using Hydrofera Blue under compression he has home health.. The wound looks a lot  better today with improvement in surface area 11/16; left anterior leg wound in the setting of chronic venous insufficiency. Wound was initially trauma. We have been using Hydrofera Blue under compression. The patient is closed today. He is supposed to follow-up with vein and vascular with regards to arterial insufficiency nevertheless his leg wounds are 12/3; apparently 2 weeks ago when they were putting on their stockings they managed to get 3 wounds on the left anterior lower leg from abrasion when putting [place], [place] ([id number]) [phone].pdf Page 6 of 10 on the stockings. Home health came by the week of Thanksgiving and put Hydrofera Blue 4-layer wrap on this and there is only one superficial area remaining. The patient and his wife complained about the difficulties getting  stockings on I think we are using 20/30. We will order bilateral external compression stockings which should be easier. 12/10; wound on the left anterior lower leg is closed. He has chronic venous insufficiency we ordered him Farrow wrap stockings unfortunately he did not bring these in. 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 He says that it has been present since mid December. He is currently residing in [facility] until March and a podiatrist has been looking after him there. They have been simply painting the area with Betadine. He has not had any lower extremity arterial studies since 2019, at which time his right ABI was 0.69. Measured in clinic today, it was 0.71. He is not aware of his most recent hemoglobin A1c, but historically he has had exceptionally poor control. On the basis of his right first metatarsal, there is a small crescent shaped wound. There is surrounding eschar and callus. There is no malodor or purulent drainage. Patient History Information obtained from Patient. Allergies No Known Drug  Allergies Family History Unknown History. Social History Never smoker, Marital Status - Single, Alcohol Use - Never, Drug Use - No History, Caffeine Use - Rarely. Medical History Respiratory Patient has history of Sleep Apnea Cardiovascular Patient has history of Arrhythmia - A-Fib, Coronary Artery Disease - s/p CABG, Deep Vein Thrombosis, Hypertension, Myocardial Infarction, Peripheral Arterial Disease - s/p Fempop x 2 Endocrine Patient has history of Type II Diabetes Neurologic Patient has history of Neuropathy Hospitalization/Surgery History - colonoscopy. - polypectomy. - peripheral vascular cath. - shoulder arthroscopy. - carpal tunnel release. - coronary artery bypass. - appendectomy. - cardiac cath. - coronary angioplasty. - thrombectomy. - knee arthroplasty. - popliteal artery stent. Medical A Surgical History Notes nd Cardiovascular CVA x 3 Musculoskeletal carpal tunnel syndrome Neurologic stroke Oncologic skin cancer Review of Systems (ROS) Eyes Denies complaints or symptoms of Dry Eyes, Vision Changes, Glasses / Contacts. Ear/Nose/Mouth/Throat Denies complaints or symptoms of Chronic sinus problems or rhinitis. Gastrointestinal Denies complaints or symptoms of Frequent diarrhea, Nausea, Vomiting. Genitourinary Denies complaints or symptoms of Frequent urination. Integumentary (Skin) Complains or has symptoms of Wounds. Musculoskeletal Complains or has symptoms of Muscle Weakness. Denies complaints or symptoms of Muscle Pain. Objective Constitutional Hypertensive, asymptomatic. No acute distress. Vitals Time Taken: 12:32 PM, Height: 70 in, Source: Stated, Weight: 260 lbs, Source: Stated, BMI: 37.3, Temperature: 98.1 F, Pulse: 77 bpm, Respiratory Rate: 20 breaths/min, Blood Pressure: 171/78 mmHg, Capillary Blood Glucose: 163 mg/dl. Respiratory Normal work of breathing on room air. Cardiovascular [place], [place] A ([id number])  [phone].pdf Page 7 of 10 2+ pitting edema bilaterally. General Notes: 08/31/2022: On the basis of his right first metatarsal, there is a small crescent shaped wound. There is surrounding eschar and callus. There is no malodor or purulent drainage. Integumentary (Hair, Skin) Wound #2 status is Open. Original cause of wound was Gradually Appeared. The date acquired was: 07/08/2022. The wound is located on the [place]. The wound measures 1.3cm length x 2.5cm width x 0.1cm depth; 2.553cm^2 area and 0.255cm^3 volume. There is Fat Layer (Subcutaneous Tissue) exposed. There is no tunneling or undermining noted. There is a medium amount of serosanguineous drainage noted. The wound margin is distinct with the outline attached to the wound base. There is large (67-100%) red granulation within the wound bed. There is a small (1-33%) amount of necrotic tissue within the wound bed including Eschar and Adherent Slough. The periwound skin appearance had no  abnormalities noted for moisture. The periwound skin appearance had no abnormalities noted for color. The periwound skin appearance exhibited: Callus. Periwound temperature was noted as No Abnormality. Assessment Active Problems ICD-10 Non-pressure chronic ulcer of other part of right foot with fat layer exposed Peripheral vascular disease, unspecified Atherosclerotic heart disease of native coronary artery without angina pectoris Essential (primary) hypertension Type 2 diabetes mellitus with hyperglycemia Type 2 diabetes mellitus with diabetic neuropathy, unspecified Cerebral infarction, unspecified Procedures Wound #2 Pre-procedure diagnosis of Wound #2 is a Diabetic Wound/Ulcer of the Lower Extremity located on the Right,Plantar Foot .Severity of Tissue Pre Debridement is: Fat layer exposed. There was a Excisional Skin/Subcutaneous Tissue Debridement with a total area of 3.25 sq cm performed by Fredirick Maudlin,  MD. With the following instrument(s): Curette to remove Non-Viable tissue/material. Material removed includes Callus, Subcutaneous Tissue, and Skin: Epidermis after achieving pain control using Lidocaine 4% Topical Solution. No specimens were taken. A time out was conducted at 13:01, prior to the start of the procedure. A Minimum amount of bleeding was controlled with Pressure. The procedure was tolerated well. Post Debridement Measurements: 1.3cm length x 2.5cm width x 0.1cm depth; 0.255cm^3 volume. Character of Wound/Ulcer Post Debridement is improved. Severity of Tissue Post Debridement is: Fat layer exposed. Post procedure Diagnosis Wound #2: Same as Pre-Procedure General Notes: scribed for Dr. Celine Ahr by Adline Peals, RN. Plan Follow-up Appointments: Return Appointment in 1 week. - Dr. Celine Ahr - room 2 Anesthetic: (In clinic) Topical Lidocaine 4% applied to wound bed Bathing/ Shower/ Hygiene: May shower and wash wound with soap and water. Edema Control - Lymphedema / SCD / Other: Elevate legs to the level of the heart or above for 30 minutes daily and/or when sitting for 3-4 times a day throughout the day. Avoid standing for long periods of time. Patient to wear own compression stockings every day. Moisturize legs daily. Off-Loading: Other: - forefront offloading shoe The following medication(s) was prescribed: lidocaine topical 4 % cream cream topical was prescribed at facility WOUND #2: - Foot Wound Laterality: Plantar, Right Cleanser: Soap and Water 1 x Per Day/30 Days Discharge Instructions: May shower and wash wound with dial antibacterial soap and water prior to dressing change. Cleanser: Wound Cleanser 1 x Per Day/30 Days Discharge Instructions: Cleanse the wound with wound cleanser prior to applying a clean dressing using gauze sponges, not tissue or cotton balls. Prim Dressing: Sorbalgon AG Dressing 2x2 (in/in) 1 x Per Day/30 Days ary Discharge Instructions: Apply to  wound bed as instructed Secondary Dressing: Optifoam Non-Adhesive Dressing, 4x4 in 1 x Per Day/30 Days Discharge Instructions: Apply over primary dressing as directed. Secondary Dressing: Woven Gauze Sponge, Non-Sterile 4x4 in 1 x Per Day/30 Days Discharge Instructions: Apply over primary dressing as directed. Secured With: Elastic Bandage 4 inch (ACE bandage) 1 x Per Day/30 Days Discharge Instructions: Secure with ACE bandage as directed. Secured With: [employer], 4.5x3.1 (in/yd) 1 x Per Day/30 Days Discharge Instructions: Secure with Kerlix as directed. [place]ol[place], [place] A ([id number]) [id number] Page 8 of 10 08/31/2022: He returns with a diabetic foot ulcer. On the base of his right first metatarsal, there is a small crescent shaped wound. There is surrounding eschar and callus. There is no malodor or purulent drainage. I used a curette to debride callus, skin, and nonviable subcutaneous tissue from the wound. We will use silver alginate and a foam donut. We attempted to put him in a forefoot offloading shoe but he was unable to walk with any degree of  stability. We switched this to a peg assist and he was able to ambulate. Follow-up in 1 week. Electronic Signature(s) Signed: 08/31/2022 1:35:02 PM By: Fredirick Maudlin MD FACS Entered By: Fredirick Maudlin on 08/31/2022 13:35:02 -------------------------------------------------------------------------------- HxROS Details Patient Name: Date of Service: Isabel Caprice, MA [place] A. 08/31/2022 12:30 PM Medical Record Number: [id number] Patient Account Number: [id number] Date of Birth/Sex: Treating RN: 1956/03/29 (67 y.o. [name] Primary Care Provider: Riki Sheer Other Clinician: Referring Provider: Treating Provider/Extender: Louann Sjogren in Treatment: 0 Information Obtained From Patient Eyes Complaints and Symptoms: Negative for: Dry Eyes; Vision Changes; Glasses /  Contacts Ear/Nose/Mouth/Throat Complaints and Symptoms: Negative for: Chronic sinus problems or rhinitis Gastrointestinal Complaints and Symptoms: Negative for: Frequent diarrhea; Nausea; Vomiting Genitourinary Complaints and Symptoms: Negative for: Frequent urination Integumentary (Skin) Complaints and Symptoms: Positive for: Wounds Musculoskeletal Complaints and Symptoms: Positive for: Muscle Weakness Negative for: Muscle Pain Medical History: Past Medical History Notes: carpal tunnel syndrome Hematologic/Lymphatic Respiratory Medical History: Positive for: Sleep Apnea Cardiovascular Medical HistoryMARKELLE, NELLI A ([id number].pdf Page 9 of 10 Positive for: Arrhythmia - A-Fib; Coronary Artery Disease - s/p CABG; Deep Vein Thrombosis; Hypertension; Myocardial Infarction; Peripheral Arterial Disease - s/p Fempop x 2 Past Medical History Notes: CVA x 3 Endocrine Medical History: Positive for: Type II Diabetes Time with diabetes: since 1997 Treated with: Insulin, Oral agents Blood sugar tested every day: Yes Tested : 2-3x per day Immunological Neurologic Medical History: Positive for: Neuropathy Past Medical History Notes: stroke Oncologic Medical History: Past Medical History Notes: skin cancer Immunizations Pneumococcal Vaccine: Received Pneumococcal Vaccination: Yes Received Pneumococcal Vaccination On or After 60th Birthday: Yes Implantable Devices None Hospitalization / Surgery History Type of Hospitalization/Surgery colonoscopy polypectomy peripheral vascular cath shoulder arthroscopy carpal tunnel release coronary artery bypass appendectomy cardiac cath coronary angioplasty thrombectomy knee arthroplasty popliteal artery stent Family and Social History Unknown History: Yes; Never smoker; Marital Status - Single; Alcohol Use: Never; Drug Use: No History; Caffeine Use: Rarely; Financial Concerns: No; Food,  Clothing or Shelter Needs: No; Support System Lacking: No; Transportation Concerns: No Electronic Signature(s) Signed: 08/31/2022 1:45:22 PM By: Fredirick Maudlin MD FACS Signed: 08/31/2022 4:32:39 PM By: Adline Peals Entered By: Adline Peals on 08/31/2022 12:42:46 -------------------------------------------------------------------------------- [place] Details Patient Name: Date of Service: Vella Raring [place] A. 08/31/2022 Medical Record Number: [id number] Patient Account Number: [id number] Date of Birth/Sex: Treating RN: [birth year] (67 y.o. [name] Primary Care Provider: Riki Sheer Other Clinician: Referring Provider: Treating Provider/Extender: Maris Berger [place], [place] ([id number]) [id number] Page 10 of 10 Weeks in Treatment: 0 Diagnosis Coding ICD-10 Codes Code Description L97.512 Non-pressure chronic ulcer of other part of right foot with fat layer exposed I73.9 Peripheral vascular disease, unspecified I25.10 Atherosclerotic heart disease of native coronary artery without angina pectoris I10 Essential (primary) hypertension E11.65 Type 2 diabetes mellitus with hyperglycemia E11.40 Type 2 diabetes mellitus with diabetic neuropathy, unspecified I63.9 Cerebral infarction, unspecified Facility Procedures : CPT4 Code: [id number] Description: 99213 - WOUND CARE VISIT-LEV 3 EST PT Modifier: 25 Quantity: 1 : CPT4 Code: [id number] Description: [id number] - DEB SUBQ TISSUE 20 SQ CM/< ICD-10 Diagnosis Description L97.512 Non-pressure chronic ulcer of other part of right foot with fat layer exposed Modifier: Quantity: 1 Physician Procedures : CPT4 Code Description Modifier [id number] - WC PHYS SUBQ TISS 20 SQ CM ICD-10 Diagnosis Description L97.512 Non-pressure chronic ulcer of other part of right foot with fat layer exposed Quantity: 1 Electronic Signature(s) Signed: 08/31/2022 1:45:22 PM By: Fredirick Maudlin MD  FACS Signed: 08/31/2022 4:32:39 PM By: Adline Peals Entered By: Adline Peals on 08/31/2022 13:45:06

## 2022-09-08 ENCOUNTER — Encounter (HOSPITAL_BASED_OUTPATIENT_CLINIC_OR_DEPARTMENT_OTHER): Payer: Medicare HMO | Admitting: General Surgery

## 2022-09-08 DIAGNOSIS — E1151 Type 2 diabetes mellitus with diabetic peripheral angiopathy without gangrene: Secondary | ICD-10-CM | POA: Diagnosis not present

## 2022-09-08 DIAGNOSIS — E11621 Type 2 diabetes mellitus with foot ulcer: Secondary | ICD-10-CM | POA: Diagnosis not present

## 2022-09-08 DIAGNOSIS — G4733 Obstructive sleep apnea (adult) (pediatric): Secondary | ICD-10-CM | POA: Diagnosis not present

## 2022-09-08 DIAGNOSIS — Z7901 Long term (current) use of anticoagulants: Secondary | ICD-10-CM | POA: Diagnosis not present

## 2022-09-08 DIAGNOSIS — Z7902 Long term (current) use of antithrombotics/antiplatelets: Secondary | ICD-10-CM | POA: Diagnosis not present

## 2022-09-08 DIAGNOSIS — I5032 Chronic diastolic (congestive) heart failure: Secondary | ICD-10-CM | POA: Diagnosis not present

## 2022-09-08 DIAGNOSIS — E1140 Type 2 diabetes mellitus with diabetic neuropathy, unspecified: Secondary | ICD-10-CM | POA: Diagnosis not present

## 2022-09-08 DIAGNOSIS — Z86718 Personal history of other venous thrombosis and embolism: Secondary | ICD-10-CM | POA: Diagnosis not present

## 2022-09-08 DIAGNOSIS — I872 Venous insufficiency (chronic) (peripheral): Secondary | ICD-10-CM | POA: Diagnosis not present

## 2022-09-08 DIAGNOSIS — I110 Hypertensive heart disease with heart failure: Secondary | ICD-10-CM | POA: Diagnosis not present

## 2022-09-08 DIAGNOSIS — E1165 Type 2 diabetes mellitus with hyperglycemia: Secondary | ICD-10-CM | POA: Diagnosis not present

## 2022-09-08 DIAGNOSIS — L97512 Non-pressure chronic ulcer of other part of right foot with fat layer exposed: Secondary | ICD-10-CM | POA: Diagnosis not present

## 2022-09-08 DIAGNOSIS — I4891 Unspecified atrial fibrillation: Secondary | ICD-10-CM | POA: Diagnosis not present

## 2022-09-09 NOTE — Progress Notes (Signed)
[name] [name], [place] [name] ([id number]) [id number] Page 1 of 7 Visit Report for 09/08/2022 Arrival Information Details Patient Name: Date of Service: [name] [name], [name] [name]. 09/08/2022 10:45 [name] [name] Medical Record Number: [id number] Patient Account Number: [id number] Date of Birth/Sex: Treating RN: 1955/12/15 (67 y.o. [name] Primary Care Don Tiu: Riki Sheer Other Clinician: Referring Catlin Doria: Treating Callen Zuba/Extender: Louann Sjogren in Treatment: 1 Visit Information History Since Last Visit Added or deleted any medications: No Patient Arrived: Ambulatory Any new allergies or adverse reactions: No Arrival Time: 10:30 Had [name] fall or experienced change in No Accompanied By: partner activities of daily living that may affect Transfer Assistance: None risk of falls: Patient Identification Verified: Yes Signs or symptoms of abuse/neglect since last visito No Secondary Verification Process Completed: Yes Hospitalized since last visit: No Implantable device outside of the clinic excluding No cellular tissue based products placed in the center since last visit: Has Dressing in Place as Prescribed: Yes Pain Present Now: No Electronic Signature(s) Signed: 09/08/2022 4:08:42 PM By: Adline Peals Entered By: Adline Peals on 09/08/2022 10:31:52 -------------------------------------------------------------------------------- Encounter Discharge Information Details Patient Name: Date of Service: [name] Caprice, MA [place] [name]. 09/08/2022 10:45 [name] [name] Medical Record Number: [id number] Patient Account Number: [id number] Date of Birth/Sex: Treating RN: 1956-02-10 (67 y.o. [name] Primary Care Tareq Dwan: Riki Sheer Other Clinician: Referring Zan Triska: Treating Tanganika Barradas/Extender: Louann Sjogren in Treatment: 1 Encounter Discharge Information Items Post Procedure Vitals Discharge Condition:  Stable Temperature (F): 97.6 Ambulatory Status: Ambulatory Pulse (bpm): 56 Discharge Destination: Home Respiratory Rate (breaths/min): 18 Transportation: Private Auto Blood Pressure (mmHg): 180/64 Accompanied By: self Schedule Follow-up Appointment: Yes Clinical Summary of Care: Patient Declined Electronic Signature(s) Signed: 09/08/2022 4:08:42 PM By: Adline Peals Entered By: Adline Peals on 09/08/2022 10:55:45 [place], [place] ([id number].pdf Page 2 of 7 -------------------------------------------------------------------------------- Lower Extremity Assessment Details Patient Name: Date of Service: [name], [name] [name]. 09/08/2022 10:45 [name] [name] Medical Record Number: [id number] Patient Account Number: [id number] Date of Birth/Sex: Treating RN: 1956-05-01 (67 y.o. [name] Primary Care Eythan Jayne: Riki Sheer Other Clinician: Referring Chala Gul: Treating Ladarrion Telfair/Extender: Louann Sjogren in Treatment: 1 Edema Assessment Assessed: Shirlyn Goltz: No] Patrice Paradise: No] [Left: Edema] [Right: :] Calf Left: Right: Point of Measurement: From Medial Instep 43 cm Ankle Left: Right: Point of Measurement: From Medial Instep 28.1 cm Electronic Signature(s) Signed: 09/08/2022 4:08:42 PM By: Adline Peals Entered By: Adline Peals on 09/08/2022 10:35:13 -------------------------------------------------------------------------------- Multi Wound Chart Details Patient Name: Date of Service: [name] [name], [name] [name]. 09/08/2022 10:45 [name] [name] Medical Record Number: [id number] Patient Account Number: [id number] Date of Birth/Sex: Treating RN: [birth year] (67 y.o. [name]) Primary Care Elester Apodaca: Riki Sheer Other Clinician: Referring Hind Chesler: Treating Jaquia Benedicto/Extender: Louann Sjogren in Treatment: 1 Vital Signs Height(in): 70 Pulse(bpm): 17 Weight(lbs): 260 Blood Pressure(mmHg): 180/64 Body Mass  Index(BMI): 37.3 Temperature(F): 97.6 Respiratory Rate(breaths/min): 18 [2:Photos:] [N/[name]:N/[name]] Right, Plantar Foot N/[name] N/[name] Wound Location: Gradually Appeared N/[name] N/[name] Wounding Event: Diabetic Wound/Ulcer of the Lower N/[name] N/[name] Primary Etiology: Extremity Sleep Apnea, Arrhythmia, Coronary N/[name] N/[name] Comorbid History: Artery Disease, Deep Vein [name], [name] [name] ([id number]) [phone].pdf Page 3 of 7 Thrombosis, Hypertension, Myocardial Infarction, Peripheral Arterial Disease, Type II Diabetes, Neuropathy 07/08/2022 N/[name] N/[name] Date Acquired: 1 N/[name] N/[name] Weeks of Treatment: Open N/[name] N/[name] Wound Status: No N/[name] N/[name] Wound Recurrence: 0.1x0.6x0.2 N/[name] N/[name] Measurements L x W x D (cm) 0.047 N/[name] N/[name] : rea 0.009 N/[name] N/[name] Volume (cm) : 98.20% N/[name] N/[name] % Reduction in  [name] rea: 96.50% N/[name] N/[name] % Reduction in Volume: Grade 1 N/[name] N/[name] Classification: Medium N/[name] N/[name] Exudate [name] mount: Serosanguineous N/[name] N/[name] Exudate Type: red, brown N/[name] N/[name] Exudate Color: Distinct, outline attached N/[name] N/[name] Wound Margin: Large (67-100%) N/[name] N/[name] Granulation [name] mount: Red N/[name] N/[name] Granulation Quality: Small (1-33%) N/[name] N/[name] Necrotic [name] mount: Fat Layer (Subcutaneous Tissue): Yes N/[name] N/[name] Exposed Structures: Fascia: No Tendon: No Muscle: No Joint: No Bone: No Medium (34-66%) N/[name] N/[name] Epithelialization: Debridement - Excisional N/[name] N/[name] Debridement: Pre-procedure Verification/Time Out 10:41 N/[name] N/[name] Taken: Lidocaine 4% Topical Solution N/[name] N/[name] Pain Control: Callus, Subcutaneous, Slough N/[name] N/[name] Tissue Debrided: Skin/Subcutaneous Tissue N/[name] N/[name] Level: 1 N/[name] N/[name] Debridement [name] (sq cm): rea Curette N/[name] N/[name] Instrument: Minimum N/[name] N/[name] Bleeding: Pressure N/[name] N/[name] Hemostasis [name] chieved: Procedure was tolerated well N/[name] N/[name] Debridement Treatment Response: 0.1x0.6x0.2 N/[name] N/[name] Post Debridement Measurements L x W x D (cm) 0.009 N/[name] N/[name] Post Debridement Volume: (cm) Callus: Yes N/[name]  N/[name] Periwound Skin Texture: No Abnormalities Noted N/[name] N/[name] Periwound Skin Moisture: No Abnormalities Noted N/[name] N/[name] Periwound Skin Color: No Abnormality N/[name] N/[name] Temperature: Debridement N/[name] N/[name] Procedures Performed: Treatment Notes Electronic Signature(s) Signed: 09/08/2022 10:48:22 AM By: Fredirick Maudlin MD FACS Entered By: Fredirick Maudlin on 09/08/2022 10:48:22 -------------------------------------------------------------------------------- Multi-Disciplinary Care Plan Details Patient Name: Date of Service: [name] Caprice, MA [place] [name]. 09/08/2022 10:45 [name] [name] Medical Record Number: [id number] Patient Account Number: [id number] Date of Birth/Sex: Treating RN: May 20, 1956 (67 y.o. [name] Primary Care Romar Woodrick: Riki Sheer Other Clinician: Referring Cheryal Salas: Treating Dara Beidleman/Extender: Louann Sjogren in Treatment: 1 Active Inactive Abuse / Safety / Falls / Self Care Management Nursing Diagnoses: Impaired physical mobility Potential for falls [place], [place] [name] ([id number]) [phone].pdf Page 4 of 7 Goals: Patient will not experience any injury related to falls Date Initiated: 08/31/2022 [place] Resolution Date: 10/23/2022 Goal Status: Active Patient/caregiver will verbalize/demonstrate measures taken to improve the patient's personal safety Date Initiated: 08/31/2022 Target Resolution Date: 10/23/2022 Goal Status: Active Interventions: Provide education on basic hygiene Provide education on fall prevention Notes: Wound/Skin Impairment Nursing Diagnoses: Impaired tissue integrity Knowledge deficit related to ulceration/compromised skin integrity Goals: Patient/caregiver will verbalize understanding of skin care regimen Date Initiated: 08/31/2022 Target Resolution Date: 10/23/2022 Goal Status: Active Interventions: Assess patient/caregiver ability to obtain necessary supplies Assess patient/caregiver ability to perform  ulcer/skin care regimen upon admission and as needed Assess ulceration(s) every visit Treatment Activities: Skin care regimen initiated : 08/31/2022 Topical wound management initiated : 08/31/2022 Notes: Electronic Signature(s) Signed: 09/08/2022 4:08:42 PM By: Adline Peals Entered By: Adline Peals on 09/08/2022 10:43:25 -------------------------------------------------------------------------------- Pain Assessment Details Patient Name: Date of Service: Vella Raring [place] [name]. 09/08/2022 10:45 [name] [name] Medical Record Number: [id number] Patient Account Number: [id number] Date of Birth/Sex: Treating RN: 10/15/1955 (67 y.o. [name] Primary Care Lorinda Copland: Riki Sheer Other Clinician: Referring Keean Wilmeth: Treating Amiee Wiley/Extender: Louann Sjogren in Treatment: 1 Active Problems Location of Pain Severity and Description of Pain Patient Has Paino No Site Locations Rate the pain. [place], [place] [name] ([id number]) [id number] Page 5 of 7 Rate the pain. Current Pain Level: 0 Pain Management and Medication Current Pain Management: Electronic Signature(s) Signed: 09/08/2022 4:08:42 PM By: Adline Peals Entered By: Adline Peals on 09/08/2022 10:35:09 -------------------------------------------------------------------------------- Patient/Caregiver Education Details Patient Name: Date of Service: Ileana Ladd 2/20/2024andnbsp10:45 [name] [name] Medical Record Number: [id number] Patient Account Number: [id number] Date of Birth/Gender: Treating RN: August 04, 1955 (67 y.o. [name] Primary Care Physician: Riki Sheer Other Clinician:  Referring Physician: Treating Physician/Extender: Louann Sjogren in Treatment: 1 Education Assessment Education Provided To: Patient Education Topics Provided Wound Debridement: Methods: Explain/Verbal Responses: Reinforcements needed, State content  correctly Electronic Signature(s) Signed: 09/08/2022 4:08:42 PM By: Adline Peals Entered By: Adline Peals on 09/08/2022 10:43:39 -------------------------------------------------------------------------------- Wound Assessment Details Patient Name: Date of Service: [name] [name], [name] [name]. 09/08/2022 10:45 [name] [name] Medical Record Number: [id number] Patient Account Number: [id number] Date of Birth/Sex: Treating RN: Jan 03, 1956 (67 y.o. [name] Primary Care Kieren Ricci: Riki Sheer Other Clinician: Referring Carnita Golob: Treating Tricia Pledger/Extender: Maris Berger [place], [place] [name] ([id number]) [phone].pdf Page 6 of 7 Weeks in Treatment: 1 Wound Status Wound Number: 2 Primary Diabetic Wound/Ulcer of the Lower Extremity Etiology: Wound Location: Right, Plantar Foot Wound Open Wounding Event: Gradually Appeared Status: Date Acquired: 07/08/2022 Comorbid Sleep Apnea, Arrhythmia, Coronary Artery Disease, Deep Vein Weeks Of Treatment: 1 History: Thrombosis, Hypertension, Myocardial Infarction, Peripheral Arterial Clustered Wound: No Disease, Type II Diabetes, Neuropathy Photos Wound Measurements Length: (cm) 0.1 Width: (cm) 0.6 Depth: (cm) 0.2 Area: (cm) 0.047 Volume: (cm) 0.009 % Reduction in Area: 98.2% % Reduction in Volume: 96.5% Epithelialization: Medium (34-66%) Tunneling: No Undermining: No Wound Description Classification: Grade 1 Wound Margin: Distinct, outline attached Exudate Amount: Medium Exudate Type: Serosanguineous Exudate Color: red, brown Foul Odor After Cleansing: No Slough/Fibrino Yes Wound Bed Granulation Amount: Large (67-100%) Exposed Structure Granulation Quality: Red Fascia Exposed: No Necrotic Amount: Small (1-33%) Fat Layer (Subcutaneous Tissue) Exposed: Yes Necrotic Quality: Adherent Slough Tendon Exposed: No Muscle Exposed: No Joint Exposed: No Bone Exposed: No Periwound Skin  Texture Texture Color No Abnormalities Noted: No No Abnormalities Noted: Yes Callus: Yes Temperature / Pain Temperature: No Abnormality Moisture No Abnormalities Noted: Yes Treatment Notes Wound #2 (Foot) Wound Laterality: Plantar, Right Cleanser Soap and Water Discharge Instruction: May shower and wash wound with dial antibacterial soap and water prior to dressing change. Wound Cleanser Discharge Instruction: Cleanse the wound with wound cleanser prior to applying [name] clean dressing using gauze sponges, not tissue or cotton balls. Peri-Wound Care Topical Primary Dressing Sorbalgon AG Dressing 2x2 (in/in) Discharge Instruction: Apply to wound bed as instructed Secondary Dressing [place], [place] ([id number]) [phone].pdf Page 7 of 7 Optifoam Non-Adhesive Dressing, 4x4 in Discharge Instruction: Apply over primary dressing as directed. Woven Gauze Sponge, Non-Sterile 4x4 in Discharge Instruction: Apply over primary dressing as directed. Secured With Elastic Bandage 4 inch (ACE bandage) Discharge Instruction: Secure with ACE bandage as directed. Kerlix Roll Sterile, 4.5x3.1 (in/yd) Discharge Instruction: Secure with Kerlix as directed. Compression Wrap Compression Stockings Add-Ons Electronic Signature(s) Signed: 09/08/2022 4:08:42 PM By: Adline Peals Entered By: Adline Peals on 09/08/2022 10:39:23 -------------------------------------------------------------------------------- Vitals Details Patient Name: Date of Service: [name] [name], [name] [name]. 09/08/2022 10:45 [name] [name] Medical Record Number: [id number] Patient Account Number: [id number] Date of Birth/Sex: Treating RN: 1956/01/31 (67 y.o. [name] Primary Care Aryanna Shaver: Riki Sheer Other Clinician: Referring Levada Bowersox: Treating Audrionna Lampton/Extender: Louann Sjogren in Treatment: 1 Vital Signs Time Taken: 10:34 Temperature (F): 97.6 Height (in): 70 Pulse (bpm):  56 Weight (lbs): 260 Respiratory Rate (breaths/min): 18 Body Mass Index (BMI): 37.3 Blood Pressure (mmHg): 180/64 Reference Range: 80 - 120 mg / dl Electronic Signature(s) Signed: 09/08/2022 4:08:42 PM By: Adline Peals Entered By: Adline Peals on 09/08/2022 10:35:04

## 2022-09-09 NOTE — Progress Notes (Signed)
[place], [name] ([id number]) [id number] Page 1 of 9 Visit Report for 09/08/2022 Chief Complaint Document Details Patient Name: Date of Service: [name], [name]. 09/08/2022 10:45 [name] Medical Record Number: [id number] Patient Account Number: [id number] Date of Birth/Sex: Treating RN: 31-Aug-1955 (67 y.o. M) Primary Care Provider: Riki Sheer Other Clinician: Referring Provider: Treating Provider/Extender: Louann Sjogren in Treatment: 1 Information Obtained from: Patient Chief Complaint 05/14/2020; patient is here for review of an abrasion injury on the left lateral calf 08/31/2022: [name] (1st metatarsal base, plantar) Electronic Signature(s) Signed: 09/08/2022 10:48:30 AM By: Fredirick Maudlin MD FACS Entered By: Fredirick Maudlin on 09/08/2022 10:48:30 -------------------------------------------------------------------------------- Debridement Details Patient Name: Date of Service: [name], [name]. 09/08/2022 10:45 [name] Medical Record Number: [id number] Patient Account Number: [id number] Date of Birth/Sex: Treating RN: 1955/11/03 (67 y.o. [name] Primary Care Provider: Riki Sheer Other Clinician: Referring Provider: Treating Provider/Extender: Louann Sjogren in Treatment: 1 Debridement Performed for Assessment: Wound #2 [place] Performed By: Physician Fredirick Maudlin, MD Debridement Type: Debridement Severity of Tissue Pre Debridement: Fat layer exposed Level of Consciousness (Pre-procedure): Awake and Alert Pre-procedure Verification/Time Out Yes - 10:41 Taken: Start Time: 10:41 Pain Control: Lidocaine 4% Topical Solution T Area Debrided (L x W): otal 1 (cm) x 1 (cm) = 1 (cm) Tissue and other material debrided: Non-Viable, Callus, Slough, Subcutaneous, Slough Level: Skin/Subcutaneous Tissue Debridement Description: Excisional Instrument: Curette Bleeding:  Minimum Hemostasis Achieved: Pressure Response to Treatment: Procedure was tolerated well Level of Consciousness (Post- Awake and Alert procedure): Post Debridement Measurements of Total Wound Length: (cm) 0.1 Width: (cm) 0.6 Depth: (cm) 0.2 Volume: (cm) 0.009 Character of Wound/Ulcer Post Debridement: Improved Severity of Tissue Post Debridement: Fat layer exposed [name], [name] ([id number] Page 2 of 9 Post Procedure Diagnosis Same as Pre-procedure Notes scribed for Dr. Celine Ahr by Adline Peals, RN Electronic Signature(s) Signed: 09/08/2022 11:01:07 AM By: Fredirick Maudlin MD FACS Signed: 09/08/2022 4:08:42 PM By: Adline Peals Entered By: Adline Peals on 09/08/2022 10:45:41 -------------------------------------------------------------------------------- HPI Details Patient Name: Date of Service: [name], [name]. 09/08/2022 10:45 [name] Medical Record Number: [id number] Patient Account Number: [id number] Date of Birth/Sex: Treating RN: [birth year] (67 y.o. M) Primary Care Provider: Riki Sheer Other Clinician: Referring Provider: Treating Provider/Extender: Louann Sjogren in Treatment: 1 History of Present Illness HPI Description: ADMISSION 05/14/2020; this is [name] with multiple medical issues. Predominantly he has type 2 diabetes with [name] and also history of fairly significant PAD. He had [name] in February 2017 he also had an atherectomy and angioplasty by Dr. Gwenlyn Found of the right popliteal artery in 2016. He is supposed to be getting arterial studies annually however this was interrupted last year because of the pandemic. He tells [place] he was at [facility] 2 weeks ago was getting out of of the scooter and traumatized his left lateral lower leg. There was [name] as the patient is on Plavix and  Eliquis. They have been dressing this with Neosporin and doing [name] fairly good job. Wound measures 2.5 x 3.5 it does not have any depth he does not have [name] in his legs outside of surgery however he does have chronic edema and skin changes suggestive of chronic venous disease possibly some degree of lymphedema as well. Past medical history includes type 2 diabetes with peripheral neuropathy and gait instability, lumbar spondylosis,  obstructive sleep apnea, history of [name], basal cell skin cancer, atrial fibrillation on anticoagulation, significant PAD as noted with [name] and [name] by Dr. Gwenlyn Found in 30th 2016. He also has [name] of coronary artery disease with an MI in 2002 hypertension hyperlipidemia and heart failure with preserved ejection fraction His last arterial studies I can see in epic were on 03/10/2018 this showed [name] and [name] with monophasic waveforms on the right. On the left his ABI was 1.20 with [name] and triphasic waveforms. He has not had arterial studies since. Our nurse in the clinic got an ABI on the left of 1.1 11/2; left anterior leg wound in the setting of chronic venous insufficiency. Wound was initially trauma. We have been using Hydrofera Blue under compression he has home health.. The wound looks [name] lot better today with improvement in surface area 11/16; left anterior leg wound in the setting of chronic venous insufficiency. Wound was initially trauma. We have been using Hydrofera Blue under compression. The patient is closed today. He is supposed to follow-up with vein and vascular with regards to arterial insufficiency nevertheless his leg wounds are 12/3; apparently 2 weeks ago when they were putting on their stockings they managed to get 3 wounds on the left anterior lower leg from abrasion when putting on  the stockings. Home health came by the week of Thanksgiving and put Hydrofera Blue 4-layer wrap on this and there is only one superficial area remaining. The patient and his wife complained about the difficulties getting stockings on I think we are using 20/30. We will order bilateral external compression stockings which should be easier. 12/10; wound on the left anterior lower leg is closed. He has chronic venous insufficiency we ordered him Farrow wrap stockings unfortunately he did not bring these in. 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 He says that it has been present since mid December. He is currently residing in [facility] until March and [name] podiatrist has been looking after him there. They have been simply painting the area with Betadine. He has not had any lower extremity arterial studies since 2019, at which time his right ABI was 0.69. Measured in clinic today, it was 0.71. He is not aware of his most recent hemoglobin A1c, but historically he has had exceptionally poor control. On the basis of his right first metatarsal, there is [name] small crescent shaped wound. There is surrounding eschar and callus. There is no malodor or purulent drainage. 09/08/2022: The original wound is smaller today and fairly clean, but there is some discoloration and [name] pulpy texture to the adjacent callus. Underneath this, the tissue is open exposing the fat layer. It looks like perhaps there was [name] crack in the callus and moisture got under the skin and caused breakdown. Electronic Signature(s) Signed: 09/08/2022 10:50:41 AM By: Fredirick Maudlin MD FACS Entered By: Fredirick Maudlin on 09/08/2022 10:50:41 [name], [name] ([id number] Page 3 of 9 -------------------------------------------------------------------------------- Physical Exam Details Patient Name: Date of Service: [name], [name]. 09/08/2022  10:45 [name] Medical Record Number: [id number] Patient Account Number: [id number] Date of Birth/Sex: Treating RN: 1955/09/10 (67 y.o. M) Primary Care Provider: Riki Sheer Other Clinician: Referring Provider: Treating Provider/Extender: Louann Sjogren in Treatment: 1 Constitutional Hypertensive,  asymptomatic. Slightly bradycardic. . . no acute distress. Respiratory Normal work of breathing on room air. Notes 09/08/2022: The original wound is smaller today and fairly clean, but there is some discoloration and [name] pulpy texture to the adjacent callus. Underneath this, the tissue is open exposing the fat layer. Electronic Signature(s) Signed: 09/08/2022 10:51:19 AM By: Fredirick Maudlin MD FACS Entered By: Fredirick Maudlin on 09/08/2022 10:51:19 -------------------------------------------------------------------------------- Physician Orders Details Patient Name: Date of Service: [name], [place] [name]. 09/08/2022 10:45 [name] Medical Record Number: [id number] Patient Account Number: [id number] Date of Birth/Sex: Treating RN: [birth year] (67 y.o. [name] Primary Care Provider: Riki Sheer Other Clinician: Referring Provider: Treating Provider/Extender: Louann Sjogren in Treatment: 1 Verbal / Phone Orders: No Diagnosis Coding ICD-10 Coding Code Description [phone] Non-pressure chronic ulcer of other part of right foot with fat layer exposed I73.9 Peripheral vascular disease, unspecified I25.10 Atherosclerotic heart disease of native coronary artery without angina pectoris I10 Essential (primary) hypertension E11.65 Type 2 diabetes mellitus with hyperglycemia E11.40 Type 2 diabetes mellitus with diabetic neuropathy, unspecified I63.9 Cerebral infarction, unspecified Follow-up Appointments ppointment in 1 week. - Dr. Celine Ahr - room 2 Return [name] Anesthetic (In clinic) Topical Lidocaine 4% applied to wound bed Bathing/  Shower/ Hygiene May shower and wash wound with soap and water. Edema Control - Lymphedema / SCD / Other Elevate legs to the level of the heart or above for 30 minutes daily and/or when sitting for 3-4 times [name] day throughout the day. Avoid standing for long periods of time. [place], [place] [name] ([id number]) [id number] Page 4 of 9 Patient to wear own compression stockings every day. Moisturize legs daily. Off-Loading Other: - forefront offloading shoe Wound Treatment Wound #2 - Foot Wound Laterality: Plantar, Right Cleanser: Soap and Water 1 x Per Day/30 Days Discharge Instructions: May shower and wash wound with dial antibacterial soap and water prior to dressing change. Cleanser: Wound Cleanser 1 x Per Day/30 Days Discharge Instructions: Cleanse the wound with wound cleanser prior to applying [name] clean dressing using gauze sponges, not tissue or cotton balls. Prim Dressing: Sorbalgon AG Dressing 2x2 (in/in) 1 x Per Day/30 Days ary Discharge Instructions: Apply to wound bed as instructed Secondary Dressing: Optifoam Non-Adhesive Dressing, 4x4 in 1 x Per Day/30 Days Discharge Instructions: Apply over primary dressing as directed. Secondary Dressing: Woven Gauze Sponge, Non-Sterile 4x4 in 1 x Per Day/30 Days Discharge Instructions: Apply over primary dressing as directed. Secured With: Elastic Bandage 4 inch (ACE bandage) 1 x Per Day/30 Days Discharge Instructions: Secure with ACE bandage as directed. Secured With: [employer], 4.5x3.1 (in/yd) 1 x Per Day/30 Days Discharge Instructions: Secure with Kerlix as directed. Patient Medications llergies: No Known Drug Allergies [name] Notifications Medication Indication Start End 09/08/2022 lidocaine DOSE topical 4 % cream - cream topical Electronic Signature(s) Signed: 09/08/2022 11:01:07 AM By: Fredirick Maudlin MD FACS Entered By: Fredirick Maudlin on 09/08/2022  10:51:30 -------------------------------------------------------------------------------- Problem List Details Patient Name: Date of Service: [name], [place] [name]. 09/08/2022 10:45 [name] Medical Record Number: [id number] Patient Account Number: [id number] Date of Birth/Sex: Treating RN: 03-16-1956 (67 y.o. M) Primary Care Provider: Riki Sheer Other Clinician: Referring Provider: Treating Provider/Extender: Louann Sjogren in Treatment: 1 Active Problems ICD-10 Encounter Code Description Active Date MDM Diagnosis L97.512 Non-pressure chronic ulcer of other part of right foot with fat layer exposed 08/31/2022 No Yes I73.9 Peripheral vascular disease, unspecified 08/31/2022 No Yes I25.10 Atherosclerotic heart disease of native coronary artery without angina pectoris  08/31/2022 No Yes [name], [name] ([id number]) [phone].pdf Page 5 of 9 I10 Essential (primary) hypertension 08/31/2022 No Yes E11.65 Type 2 diabetes mellitus with hyperglycemia 08/31/2022 No Yes E11.40 Type 2 diabetes mellitus with diabetic neuropathy, unspecified 08/31/2022 No Yes I63.9 Cerebral infarction, unspecified 08/31/2022 No Yes Inactive Problems Resolved Problems Electronic Signature(s) Signed: 09/08/2022 10:48:12 AM By: Fredirick Maudlin MD FACS Entered By: Fredirick Maudlin on 09/08/2022 10:48:12 -------------------------------------------------------------------------------- Progress Note Details Patient Name: Date of Service: [name], [place] [name]. 09/08/2022 10:45 [name] Medical Record Number: [id number] Patient Account Number: [id number] Date of Birth/Sex: Treating RN: [birth year] (67 y.o. M) Primary Care Provider: Riki Sheer Other Clinician: Referring Provider: Treating Provider/Extender: Louann Sjogren in Treatment: 1 Subjective Chief Complaint Information obtained from Patient 05/14/2020; patient is here for review of an abrasion injury  on the left lateral calf 08/31/2022: [name] (1st metatarsal base, plantar) History of Present Illness (HPI) ADMISSION 05/14/2020; this is [name] with multiple medical issues. Predominantly he has type 2 diabetes with [name] and also history of fairly significant PAD. He had [name] in February 2017 he also had an atherectomy and angioplasty by Dr. Gwenlyn Found of the right popliteal artery in 2016. He is supposed to be getting arterial studies annually however this was interrupted last year because of the pandemic. He tells [place] he was at [facility] 2 weeks ago was getting out of of the scooter and traumatized his left lateral lower leg. There was [name] as the patient is on Plavix and Eliquis. They have been dressing this with Neosporin and doing [name] fairly good job. Wound measures 2.5 x 3.5 it does not have any depth he does not have [name] in his legs outside of surgery however he does have chronic edema and skin changes suggestive of chronic venous disease possibly some degree of lymphedema as well. Past medical history includes type 2 diabetes with peripheral neuropathy and gait instability, lumbar spondylosis, obstructive sleep apnea, history of [name], basal cell skin cancer, atrial fibrillation on anticoagulation, significant PAD as noted with [name] and [name] by Dr. Gwenlyn Found in 30th 2016. He also has [name] of coronary artery disease with an MI in 2002 hypertension hyperlipidemia and heart failure with preserved ejection fraction His last arterial studies I can see in epic were on 03/10/2018 this showed [name] and [name] with monophasic waveforms on the right. On the left his ABI was 1.20 with [name] and triphasic waveforms. He has not had  arterial studies since. Our nurse in the clinic got an ABI on the left of 1.1 11/2; left anterior leg wound in the setting of chronic venous insufficiency. Wound was initially trauma. We have been using Hydrofera Blue under compression he has home health.. The wound looks [name] lot better today with improvement in surface area 11/16; left anterior leg wound in the setting of chronic venous insufficiency. Wound was initially trauma. We have been using Hydrofera Blue under compression. The patient is closed today. He is supposed to follow-up with vein and vascular with regards to arterial insufficiency nevertheless his leg wounds are 12/3; apparently 2 weeks ago when they were putting on their stockings they managed to get 3 wounds on the left anterior lower leg from abrasion  when putting on the stockings. Home health came by the week of Thanksgiving and put Hydrofera Blue 4-layer wrap on this and there is only one superficial area remaining. The patient and his wife complained about the difficulties getting stockings on I think we are using 20/30. We will order bilateral external compression stockings which should be easier. [place], [place] [name] ([id number]) [id number] Page 6 of 9 12/10; wound on the left anterior lower leg is closed. He has chronic venous insufficiency we ordered him Farrow wrap stockings unfortunately he did not bring these in. 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 He says that it has been present since mid December. He is currently residing in [facility] until March and [name] podiatrist has been looking after him there. They have been simply painting the area with Betadine. He has not had any lower extremity arterial studies since 2019, at which time his right ABI was 0.69. Measured in clinic today, it was 0.71. He is not aware of his most recent hemoglobin A1c, but historically he has had  exceptionally poor control. On the basis of his right first metatarsal, there is [name] small crescent shaped wound. There is surrounding eschar and callus. There is no malodor or purulent drainage. 09/08/2022: The original wound is smaller today and fairly clean, but there is some discoloration and [name] pulpy texture to the adjacent callus. Underneath this, the tissue is open exposing the fat layer. It looks like perhaps there was [name] crack in the callus and moisture got under the skin and caused breakdown. Patient History Information obtained from Patient. Family History Unknown History. Social History Never smoker, Marital Status - Single, Alcohol Use - Never, Drug Use - No History, Caffeine Use - Rarely. Medical History Respiratory Patient has history of Sleep Apnea Cardiovascular Patient has history of Arrhythmia - [name]-Fib, Coronary Artery Disease - s/p CABG, Deep Vein Thrombosis, Hypertension, Myocardial Infarction, Peripheral Arterial Disease - s/p Fempop x 2 Endocrine Patient has history of Type II Diabetes Neurologic Patient has history of Neuropathy Hospitalization/Surgery History - colonoscopy. - polypectomy. - peripheral vascular cath. - shoulder arthroscopy. - carpal tunnel release. - coronary artery bypass. - appendectomy. - cardiac cath. - coronary angioplasty. - thrombectomy. - knee arthroplasty. - popliteal artery stent. 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 Slightly bradycardic. no acute distress. Vitals Time Taken: 10:34 AM, Height: 70 in, Weight: 260 lbs, BMI: 37.3, Temperature: 97.6 F, Pulse: 56 bpm, Respiratory Rate: 18 breaths/min, Blood Pressure: 180/64 mmHg. Respiratory Normal work of breathing on room air. General Notes: 09/08/2022: The original wound is smaller today and fairly clean, but there is some  discoloration and [name] pulpy texture to the adjacent callus. Underneath this, the tissue is open exposing the fat layer. Integumentary (Hair, Skin) Wound #2 status is Open. Original cause of wound was Gradually Appeared. The date acquired was: 07/08/2022. The wound has been in treatment 1 weeks. The wound is located on the [place]. The wound measures 0.1cm length x 0.6cm width x 0.2cm depth; 0.047cm^2 area and 0.009cm^3 volume. There is Fat Layer (Subcutaneous Tissue) exposed. There is no tunneling or undermining noted. There is [name] medium amount of serosanguineous drainage noted. The wound margin is distinct with the outline attached to the wound base. There is large (67-100%) red granulation within the wound bed. There is [name] small (1-33%) amount  of necrotic tissue within the wound bed including Adherent Slough. The periwound skin appearance had no abnormalities noted for moisture. The periwound skin appearance had no abnormalities noted for color. The periwound skin appearance exhibited: Callus. Periwound temperature was noted as No Abnormality. Assessment [name], [name] ([id number]) [phone].pdf Page 7 of 9 Active Problems ICD-10 Non-pressure chronic ulcer of other part of right foot with fat layer exposed Peripheral vascular disease, unspecified Atherosclerotic heart disease of native coronary artery without angina pectoris Essential (primary) hypertension Type 2 diabetes mellitus with hyperglycemia Type 2 diabetes mellitus with diabetic neuropathy, unspecified Cerebral infarction, unspecified Procedures Wound #2 Pre-procedure diagnosis of Wound #2 is [name] Diabetic Wound/Ulcer of the Lower Extremity located on the [place] .Severity of Tissue Pre Debridement is: Fat layer exposed. There was [name] Excisional Skin/Subcutaneous Tissue Debridement with [name] total area of 1 sq cm performed by Fredirick Maudlin, MD. With the following instrument(s): Curette to  remove Non-Viable tissue/material. Material removed includes Callus, Subcutaneous Tissue, and Slough after achieving pain control using Lidocaine 4% T opical Solution. No specimens were taken. [name] time out was conducted at 10:41, prior to the start of the procedure. [name] Minimum amount of bleeding was controlled with Pressure. The procedure was tolerated well. Post Debridement Measurements: 0.1cm length x 0.6cm width x 0.2cm depth; 0.009cm^3 volume. Character of Wound/Ulcer Post Debridement is improved. Severity of Tissue Post Debridement is: Fat layer exposed. Post procedure Diagnosis Wound #2: Same as Pre-Procedure General Notes: scribed for Dr. Celine Ahr by Adline Peals, RN. Plan Follow-up Appointments: Return Appointment in 1 week. - Dr. Celine Ahr - room 2 Anesthetic: (In clinic) Topical Lidocaine 4% applied to wound bed Bathing/ Shower/ Hygiene: May shower and wash wound with soap and water. Edema Control - Lymphedema / SCD / Other: Elevate legs to the level of the heart or above for 30 minutes daily and/or when sitting for 3-4 times [name] day throughout the day. Avoid standing for long periods of time. Patient to wear own compression stockings every day. Moisturize legs daily. Off-Loading: Other: - forefront offloading shoe The following medication(s) was prescribed: lidocaine topical 4 % cream cream topical was prescribed at facility WOUND #2: - Foot Wound Laterality: Plantar, Right Cleanser: Soap and Water 1 x Per Day/30 Days Discharge Instructions: May shower and wash wound with dial antibacterial soap and water prior to dressing change. Cleanser: Wound Cleanser 1 x Per Day/30 Days Discharge Instructions: Cleanse the wound with wound cleanser prior to applying [name] clean dressing using gauze sponges, not tissue or cotton balls. Prim Dressing: Sorbalgon AG Dressing 2x2 (in/in) 1 x Per Day/30 Days ary Discharge Instructions: Apply to wound bed as instructed Secondary Dressing: Optifoam  Non-Adhesive Dressing, 4x4 in 1 x Per Day/30 Days Discharge Instructions: Apply over primary dressing as directed. Secondary Dressing: Woven Gauze Sponge, Non-Sterile 4x4 in 1 x Per Day/30 Days Discharge Instructions: Apply over primary dressing as directed. Secured With: Elastic Bandage 4 inch (ACE bandage) 1 x Per Day/30 Days Discharge Instructions: Secure with ACE bandage as directed. Secured With: [employer], 4.5x3.1 (in/yd) 1 x Per Day/30 Days Discharge Instructions: Secure with Kerlix as directed. 09/08/2022: The original wound is smaller today and fairly clean, but there is some discoloration and [name] pulpy texture to the adjacent callus. Underneath this, the tissue is open exposing the fat layer. I used [name] curette to debride callus, nonviable subcutaneous tissue, and slough from the wound. He was cautioned to make sure that his foot is completely dry when changing his dressings.  We will continue silver alginate with [name] foam donut and [name] peg assist shoe for offloading. Follow-up in 1 week. Electronic Signature(s) Signed: 09/08/2022 10:52:27 AM By: Fredirick Maudlin MD FACS Entered By: Fredirick Maudlin on 09/08/2022 10:52:26 [place], [name] ([id number] Page 8 of 9 -------------------------------------------------------------------------------- HxROS Details Patient Name: Date of Service: [name], [name] [place] [name]. 09/08/2022 10:45 [name] Medical Record Number: [id number] Patient Account Number: [id number] Date of Birth/Sex: Treating RN: [birth year] (67 y.o. M) Primary Care Provider: Riki Sheer Other Clinician: Referring Provider: Treating Provider/Extender: Louann Sjogren in Treatment: 1 Information Obtained From Patient Respiratory Medical History: Positive for: Sleep Apnea Cardiovascular Medical History: Positive for: Arrhythmia - [name]-Fib; Coronary Artery Disease - s/p CABG; Deep Vein Thrombosis; Hypertension; Myocardial  Infarction; Peripheral Arterial Disease - s/p Fempop x 2 Past Medical History Notes: CVA x 3 Endocrine Medical History: Positive for: Type II Diabetes Time with diabetes: since 1997 Treated with: Insulin, Oral agents Blood sugar tested every day: Yes Tested : 2-3x per day Musculoskeletal Medical History: Past Medical History Notes: carpal tunnel syndrome Neurologic Medical History: Positive for: Neuropathy Past Medical History Notes: stroke Oncologic Medical History: Past Medical History Notes: skin cancer Immunizations Pneumococcal Vaccine: Received Pneumococcal Vaccination: Yes Received Pneumococcal Vaccination On or After 60th Birthday: Yes Implantable Devices None Hospitalization / Surgery History Type of Hospitalization/Surgery colonoscopy polypectomy peripheral vascular cath shoulder arthroscopy carpal tunnel release coronary artery bypass appendectomy cardiac cath [place], [name] ([id number]) [phone].pdf Page 9 of 9 coronary angioplasty thrombectomy knee arthroplasty popliteal artery stent Family and Social History Unknown History: Yes; Never smoker; Marital Status - Single; Alcohol Use: Never; Drug Use: No History; Caffeine Use: Rarely; Financial Concerns: No; Food, Clothing or Shelter Needs: No; Support System Lacking: No; Transportation Concerns: No Electronic Signature(s) Signed: 09/08/2022 11:01:07 AM By: Fredirick Maudlin MD FACS Entered By: Fredirick Maudlin on 09/08/2022 10:50:48 -------------------------------------------------------------------------------- SuperBill Details Patient Name: Date of Service: [name] [place] [name]. 09/08/2022 Medical Record Number: [id number] Patient Account Number: [id number] Date of Birth/Sex: Treating RN: October 30, 1955 (67 y.o. M) Primary Care Provider: Riki Sheer Other Clinician: Referring Provider: Treating Provider/Extender: Louann Sjogren in Treatment:  1 Diagnosis Coding ICD-10 Codes Code Description [phone] Non-pressure chronic ulcer of other part of right foot with fat layer exposed I73.9 Peripheral vascular disease, unspecified I25.10 Atherosclerotic heart disease of native coronary artery without angina pectoris I10 Essential (primary) hypertension E11.65 Type 2 diabetes mellitus with hyperglycemia E11.40 Type 2 diabetes mellitus with diabetic neuropathy, unspecified I63.9 Cerebral infarction, unspecified Facility Procedures : CPT4 Code: [id number] Description: [id number] - DEB SUBQ TISSUE 20 SQ CM/< ICD-10 Diagnosis Description L97.512 Non-pressure chronic ulcer of other part of right foot with fat layer exposed Modifier: Quantity: 1 Physician Procedures : CPT4 Code Description Modifier [id number] - WC PHYS LEVEL 3 - EST PT 25 ICD-10 Diagnosis Description L97.512 Non-pressure chronic ulcer of other part of right foot with fat layer exposed E11.40 Type 2 diabetes mellitus with diabetic neuropathy,  unspecified I73.9 Peripheral vascular disease, unspecified E11.65 Type 2 diabetes mellitus with hyperglycemia Quantity: 1 : [id number] - WC PHYS SUBQ TISS 20 SQ CM ICD-10 Diagnosis Description L97.512 Non-pressure chronic ulcer of other part of right foot with fat layer exposed Quantity: 1 Electronic Signature(s) Signed: 09/08/2022 10:53:05 AM By: Fredirick Maudlin MD FACS Entered By: Fredirick Maudlin on 09/08/2022 10:53:04

## 2022-09-10 ENCOUNTER — Other Ambulatory Visit: Payer: Self-pay | Admitting: Internal Medicine

## 2022-09-15 ENCOUNTER — Encounter (HOSPITAL_BASED_OUTPATIENT_CLINIC_OR_DEPARTMENT_OTHER): Payer: Medicare HMO | Admitting: General Surgery

## 2022-09-15 DIAGNOSIS — I5032 Chronic diastolic (congestive) heart failure: Secondary | ICD-10-CM | POA: Diagnosis not present

## 2022-09-15 DIAGNOSIS — E1140 Type 2 diabetes mellitus with diabetic neuropathy, unspecified: Secondary | ICD-10-CM | POA: Diagnosis not present

## 2022-09-15 DIAGNOSIS — I110 Hypertensive heart disease with heart failure: Secondary | ICD-10-CM | POA: Diagnosis not present

## 2022-09-15 DIAGNOSIS — E1151 Type 2 diabetes mellitus with diabetic peripheral angiopathy without gangrene: Secondary | ICD-10-CM | POA: Diagnosis not present

## 2022-09-15 DIAGNOSIS — E11621 Type 2 diabetes mellitus with foot ulcer: Secondary | ICD-10-CM | POA: Diagnosis not present

## 2022-09-15 DIAGNOSIS — Z7901 Long term (current) use of anticoagulants: Secondary | ICD-10-CM | POA: Diagnosis not present

## 2022-09-15 DIAGNOSIS — I872 Venous insufficiency (chronic) (peripheral): Secondary | ICD-10-CM | POA: Diagnosis not present

## 2022-09-15 DIAGNOSIS — Z86718 Personal history of other venous thrombosis and embolism: Secondary | ICD-10-CM | POA: Diagnosis not present

## 2022-09-15 DIAGNOSIS — E1165 Type 2 diabetes mellitus with hyperglycemia: Secondary | ICD-10-CM | POA: Diagnosis not present

## 2022-09-15 DIAGNOSIS — L97512 Non-pressure chronic ulcer of other part of right foot with fat layer exposed: Secondary | ICD-10-CM | POA: Diagnosis not present

## 2022-09-15 DIAGNOSIS — I4891 Unspecified atrial fibrillation: Secondary | ICD-10-CM | POA: Diagnosis not present

## 2022-09-15 DIAGNOSIS — Z7902 Long term (current) use of antithrombotics/antiplatelets: Secondary | ICD-10-CM | POA: Diagnosis not present

## 2022-09-15 DIAGNOSIS — G4733 Obstructive sleep apnea (adult) (pediatric): Secondary | ICD-10-CM | POA: Diagnosis not present

## 2022-09-17 NOTE — Progress Notes (Signed)
[name], [name] ([id number]) [id number] Page 1 of 9 Visit Report for 09/15/2022 Chief Complaint Document Details Patient Name: Date of Service: [name], [name]. 09/15/2022 10:00 [name] Medical Record Number: [id number] Patient Account Number: [id number] Date of Birth/Sex: Treating RN: 1956-01-23 (67 y.o. M) Primary Care Provider: Riki [name] Other Clinician: Referring Provider: Treating Provider/Extender: [name] in Treatment: 2 Information Obtained from: Patient Chief Complaint 05/14/2020; patient is here for review of an abrasion injury on the left lateral calf 08/31/2022: [name] (1st metatarsal base, plantar) Electronic Signature(s) Signed: 09/15/2022 10:52:05 AM By: Fredirick Maudlin MD FACS Entered By: Fredirick Maudlin on 09/15/2022 10:52:05 -------------------------------------------------------------------------------- Debridement Details Patient Name: Date of Service: [name] Caprice, [name] [place] [name]. 09/15/2022 10:00 [name] Medical Record Number: [id number] Patient Account Number: [id number] Date of Birth/Sex: Treating RN: [birth year] (67 y.o. [name] Primary Care Provider: Riki [name] Other Clinician: Referring Provider: Treating Provider/Extender: [name] in Treatment: 2 Debridement Performed for Assessment: Wound #2 [place] Performed By: Physician Fredirick Maudlin, MD Debridement Type: Debridement Severity of Tissue Pre Debridement: Fat layer exposed Level of Consciousness (Pre-procedure): Awake and Alert Pre-procedure Verification/Time Out Yes - 10:17 Taken: Start Time: 10:17 Pain Control: Lidocaine 5% topical ointment T Area Debrided (L x W): otal 0.5 (cm) x 3.7 (cm) = 1.85 (cm) Tissue and other material debrided: Non-Viable, Callus, Slough, Subcutaneous, Slough Level: Skin/Subcutaneous Tissue Debridement Description: Excisional Instrument:  Curette Bleeding: Minimum Hemostasis Achieved: Pressure Response to Treatment: Procedure was tolerated well Level of Consciousness (Post- Awake and Alert procedure): Post Debridement Measurements of Total Wound Length: (cm) 0.5 Width: (cm) 3.7 Depth: (cm) 0.1 Volume: (cm) 0.145 Character of Wound/Ulcer Post Debridement: Improved Severity of Tissue Post Debridement: Fat layer exposed Post Procedure Diagnosis Same as Pre-procedure Notes scribed for Dr. Celine Ahr by Adline Peals, RN Electronic Signature(s) Signed: 09/15/2022 11:31:02 AM By: Fredirick Maudlin MD FACS Signed: 09/16/2022 3:42:50 PM By: [name], [place] ([id number] Page 2 of 9 Signed: 09/16/2022 3:42:50 PM By: Sabas Sous By: Adline Peals on 09/15/2022 10:21:27 -------------------------------------------------------------------------------- HPI Details Patient Name: Date of Service: [name], [name] [place] [name]. 09/15/2022 10:00 [name] Medical Record Number: [id number] Patient Account Number: [id number] Date of Birth/Sex: Treating RN: 08/08/1955 (67 y.o. M) Primary Care Provider: Riki [name] Other Clinician: Referring Provider: Treating Provider/Extender: [name] in Treatment: 2 History of Present Illness HPI Description: ADMISSION 05/14/2020; this is [name] with multiple medical issues. Predominantly he has type 2 diabetes with [name] and also history of fairly significant PAD. He had [name] in February 2017 he also had an atherectomy and angioplasty by Dr. Gwenlyn Found of the right popliteal artery in 2016. He is supposed to be getting arterial studies annually however this was interrupted last year because of the pandemic. He tells [place] he was at [facility] 2 weeks ago was getting out of of the scooter and traumatized his left lateral lower  leg. There was [name] as the patient is on Plavix and Eliquis. They have been dressing this with Neosporin and doing [name] fairly good job. Wound measures 2.5 x 3.5 it does not have any depth he does not have [name] in his legs outside of surgery however he does have chronic edema and skin changes suggestive of chronic venous disease possibly some degree of lymphedema as well. Past medical history includes type 2 diabetes with  peripheral neuropathy and gait instability, lumbar spondylosis, obstructive sleep apnea, history of [name], basal cell skin cancer, atrial fibrillation on anticoagulation, significant PAD as noted with [name] and [name] by Dr. Gwenlyn Found in 30th 2016. He also has [name] of coronary artery disease with an MI in 2002 hypertension hyperlipidemia and heart failure with preserved ejection fraction His last arterial studies I can see in epic were on 03/10/2018 this showed [name] and [name] with monophasic waveforms on the right. On the left his ABI was 1.20 with [name] and triphasic waveforms. He has not had arterial studies since. Our nurse in the clinic got an ABI on the left of 1.1 11/2; left anterior leg wound in the setting of chronic venous insufficiency. Wound was initially trauma. We have been using Hydrofera Blue under compression he has home health.. The wound looks [name] lot better today with improvement in surface area 11/16; left anterior leg wound in the setting of chronic venous insufficiency. Wound was initially trauma. We have been using Hydrofera Blue under compression. The patient is closed today. He is supposed to follow-up with vein and vascular with regards to arterial insufficiency nevertheless his leg wounds are 12/3; apparently 2 weeks ago when they were putting on their stockings they managed to get 3  wounds on the left anterior lower leg from abrasion when putting on the stockings. Home health came by the week of Thanksgiving and put Hydrofera Blue 4-layer wrap on this and there is only one superficial area remaining. The patient and his wife complained about the difficulties getting stockings on I think we are using 20/30. We will order bilateral external compression stockings which should be easier. 12/10; wound on the left anterior lower leg is closed. He has chronic venous insufficiency we ordered him Farrow wrap stockings unfortunately he did not bring these in. 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 He says that it has been present since mid December. He is currently residing in [facility] until March and [name] podiatrist has been looking after him there. They have been simply painting the area with Betadine. He has not had any lower extremity arterial studies since 2019, at which time his right ABI was 0.69. Measured in clinic today, it was 0.71. He is not aware of his most recent hemoglobin A1c, but historically he has had exceptionally poor control. On the basis of his right first metatarsal, there is [name] small crescent shaped wound. There is surrounding eschar and callus. There is no malodor or purulent drainage. 09/08/2022: The original wound is smaller today and fairly clean, but there is some discoloration and [name] pulpy texture to the adjacent callus. Underneath this, the tissue is open exposing the fat layer. It looks like perhaps there was [name] crack in the callus and moisture got under the skin and caused breakdown. 09/15/2022: There has been more moisture related tissue breakdown. The callus is very soft and the underlying tissue is more open. Electronic Signature(s) Signed: 09/15/2022 11:00:55 AM By: Fredirick Maudlin MD FACS Entered By: Fredirick Maudlin on 09/15/2022  11:00:54 -------------------------------------------------------------------------------- Physical Exam Details Patient Name: Date of Service: [name] Caprice, [name] [place] [name]. 09/15/2022 10:00 [name] Medical Record Number: [id number] Patient Account Number: [id number] Date of Birth/Sex: Treating RN: March 06, 1956 (67 y.o. M) Primary Care  Provider: Riki [name] Other Clinician: Referring Provider: Treating Provider/Extender: [name] in Treatment: 2 Constitutional [name], [name] ([id number]) [id number] Page 3 of 9 Hypertensive, asymptomatic. Bradycardic, asymptomatic. . . no acute distress. Respiratory Normal work of breathing on room air. Notes 09/15/2022: There has been more moisture related tissue breakdown. The callus is very soft and the underlying tissue is more open. Electronic Signature(s) Signed: 09/15/2022 11:03:58 AM By: Fredirick Maudlin MD FACS Entered By: Fredirick Maudlin on 09/15/2022 11:03:58 -------------------------------------------------------------------------------- Physician Orders Details Patient Name: Date of Service: [name], [place] [name]. 09/15/2022 10:00 [name] Medical Record Number: [id number] Patient Account Number: [id number] Date of Birth/Sex: Treating RN: 10-01-1955 (67 y.o. [name] Primary Care Provider: Riki [name] Other Clinician: Referring Provider: Treating Provider/Extender: [name] in Treatment: 2 Verbal / Phone Orders: No Diagnosis Coding ICD-10 Coding Code Description [phone] Non-pressure chronic ulcer of other part of right foot with fat layer exposed I73.9 Peripheral vascular disease, unspecified I25.10 Atherosclerotic heart disease of native coronary artery without angina pectoris I10 Essential (primary) hypertension E11.65 Type 2 diabetes mellitus with hyperglycemia E11.40 Type 2 diabetes mellitus with diabetic neuropathy, unspecified I63.9 Cerebral  infarction, unspecified Follow-up Appointments ppointment in 1 week. - Dr. Celine Ahr - room 2 Return [name] Anesthetic (In clinic) Topical Lidocaine 5% applied to wound bed Bathing/ Shower/ Hygiene May shower and wash wound with soap and water. Edema Control - Lymphedema / SCD / Other Elevate legs to the level of the heart or above for 30 minutes daily and/or when sitting for 3-4 times [name] day throughout the day. Avoid standing for long periods of time. Patient to wear own compression stockings every day. Moisturize legs daily. Off-Loading Other: - forefront offloading shoe Wound Treatment Wound #2 - Foot Wound Laterality: Plantar, Right Cleanser: Soap and Water 1 x Per Day/30 Days Discharge Instructions: May shower and wash wound with dial antibacterial soap and water prior to dressing change. Cleanser: Wound Cleanser 1 x Per Day/30 Days Discharge Instructions: Cleanse the wound with wound cleanser prior to applying [name] clean dressing using gauze sponges, not tissue or cotton balls. Prim Dressing: Sorbalgon AG Dressing 2x2 (in/in) 1 x Per Day/30 Days ary Discharge Instructions: Apply to wound bed as instructed Secondary Dressing: Optifoam Non-Adhesive Dressing, 4x4 in 1 x Per Day/30 Days Discharge Instructions: Apply over primary dressing as directed. Secondary Dressing: Woven Gauze Sponge, Non-Sterile 4x4 in 1 x Per Day/30 Days Discharge Instructions: Apply over primary dressing as directed. Secured With: Elastic Bandage 4 inch (ACE bandage) 1 x Per Day/30 Days Discharge Instructions: Secure with ACE bandage as directed. [place], [place] [name] ([id number]) [id number] Page 4 of 9 Secured With: [employer], 4.5x3.1 (in/yd) 1 x Per Day/30 Days Discharge Instructions: Secure with Kerlix as directed. Patient Medications llergies: No Known Drug Allergies [name] Notifications Medication Indication Start End 09/15/2022 lidocaine DOSE topical 5 % ointment - ointment  topical Electronic Signature(s) Signed: 09/15/2022 11:31:02 AM By: Fredirick Maudlin MD FACS Entered By: Fredirick Maudlin on 09/15/2022 11:04:13 -------------------------------------------------------------------------------- Problem List Details Patient Name: Date of Service: [name] Caprice, [name] [place] [name]. 09/15/2022 10:00 [name] Medical Record Number: [id number] Patient Account Number: [id number] Date of Birth/Sex: Treating RN: [birth year] (67 y.o. M) Primary Care Provider: Riki [name] Other Clinician: Referring Provider: Treating Provider/Extender: [name] in Treatment: 2 Active Problems ICD-10 Encounter Code Description Active Date MDM Diagnosis L97.512 Non-pressure chronic ulcer of other part of right foot with fat layer exposed 08/31/2022 No Yes I73.9 Peripheral vascular  disease, unspecified 08/31/2022 No Yes I25.10 Atherosclerotic heart disease of native coronary artery without angina pectoris 08/31/2022 No Yes I10 Essential (primary) hypertension 08/31/2022 No Yes E11.65 Type 2 diabetes mellitus with hyperglycemia 08/31/2022 No Yes E11.40 Type 2 diabetes mellitus with diabetic neuropathy, unspecified 08/31/2022 No Yes I63.9 Cerebral infarction, unspecified 08/31/2022 No Yes Inactive Problems Resolved Problems Electronic Signature(s) Signed: 09/15/2022 10:44:04 AM By: Fredirick Maudlin MD FACS Entered By: Fredirick Maudlin on 09/15/2022 10:44:04 [place], Keegan [name] ([id number] Page 5 of 9 -------------------------------------------------------------------------------- Progress Note Details Patient Name: Date of Service: [name], [name]. 09/15/2022 10:00 [name] Medical Record Number: [id number] Patient Account Number: [id number] Date of Birth/Sex: Treating RN: 1956/05/12 (67 y.o. M) Primary Care Provider: Riki [name] Other Clinician: Referring Provider: Treating Provider/Extender: [name] in  Treatment: 2 Subjective Chief Complaint Information obtained from Patient 05/14/2020; patient is here for review of an abrasion injury on the left lateral calf 08/31/2022: [name] (1st metatarsal base, plantar) History of Present Illness (HPI) ADMISSION 05/14/2020; this is [name] with multiple medical issues. Predominantly he has type 2 diabetes with [name] and also history of fairly significant PAD. He had [name] in February 2017 he also had an atherectomy and angioplasty by Dr. Gwenlyn Found of the right popliteal artery in 2016. He is supposed to be getting arterial studies annually however this was interrupted last year because of the pandemic. He tells [place] he was at [facility] 2 weeks ago was getting out of of the scooter and traumatized his left lateral lower leg. There was [name] as the patient is on Plavix and Eliquis. They have been dressing this with Neosporin and doing [name] fairly good job. Wound measures 2.5 x 3.5 it does not have any depth he does not have [name] in his legs outside of surgery however he does have chronic edema and skin changes suggestive of chronic venous disease possibly some degree of lymphedema as well. Past medical history includes type 2 diabetes with peripheral neuropathy and gait instability, lumbar spondylosis, obstructive sleep apnea, history of [name], basal cell skin cancer, atrial fibrillation on anticoagulation, significant PAD as noted with [name] and [name] by Dr. Gwenlyn Found in 30th 2016. He also has [name] of coronary artery disease with an MI in 2002 hypertension hyperlipidemia and heart failure with preserved ejection fraction His last arterial studies I can see in epic were on 03/10/2018 this showed [name] and [name] right TBI  of 0.5 with monophasic waveforms on the right. On the left his ABI was 1.20 with [name] and triphasic waveforms. He has not had arterial studies since. Our nurse in the clinic got an ABI on the left of 1.1 11/2; left anterior leg wound in the setting of chronic venous insufficiency. Wound was initially trauma. We have been using Hydrofera Blue under compression he has home health.. The wound looks [name] lot better today with improvement in surface area 11/16; left anterior leg wound in the setting of chronic venous insufficiency. Wound was initially trauma. We have been using Hydrofera Blue under compression. The patient is closed today. He is supposed to follow-up with vein and vascular with regards to arterial insufficiency nevertheless his leg wounds are 12/3; apparently 2 weeks ago when they were putting on  their stockings they managed to get 3 wounds on the left anterior lower leg from abrasion when putting on the stockings. Home health came by the week of Thanksgiving and put Hydrofera Blue 4-layer wrap on this and there is only one superficial area remaining. The patient and his wife complained about the difficulties getting stockings on I think we are using 20/30. We will order bilateral external compression stockings which should be easier. 12/10; wound on the left anterior lower leg is closed. He has chronic venous insufficiency we ordered him Farrow wrap stockings unfortunately he did not bring these in. 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 He says that it has been present since mid December. He is currently residing in [facility] until March and [name] podiatrist has been looking after him there. They have been simply painting the area with Betadine. He has not had any lower extremity arterial studies since 2019, at which time his right ABI was 0.69. Measured in clinic today, it was 0.71. He is not aware of his most  recent hemoglobin A1c, but historically he has had exceptionally poor control. On the basis of his right first metatarsal, there is [name] small crescent shaped wound. There is surrounding eschar and callus. There is no malodor or purulent drainage. 09/08/2022: The original wound is smaller today and fairly clean, but there is some discoloration and [name] pulpy texture to the adjacent callus. Underneath this, the tissue is open exposing the fat layer. It looks like perhaps there was [name] crack in the callus and moisture got under the skin and caused breakdown. 09/15/2022: There has been more moisture related tissue breakdown. The callus is very soft and the underlying tissue is more open. Patient History Information obtained from Patient. Family History Unknown History. Social History Never smoker, Marital Status - Single, Alcohol Use - Never, Drug Use - No History, Caffeine Use - Rarely. Medical History Respiratory Patient has history of Sleep Apnea Cardiovascular Patient has history of Arrhythmia - [name]-Fib, Coronary Artery Disease - s/p CABG, Deep Vein Thrombosis, Hypertension, Myocardial Infarction, Peripheral Arterial Disease - s/p Fempop x 2 Endocrine Patient has history of Type II Diabetes Neurologic Patient has history of Neuropathy Hospitalization/Surgery History - colonoscopy. - polypectomy. - peripheral vascular cath. - shoulder arthroscopy. - carpal tunnel release. - coronary artery [name], [name] ([id number]) [id number].pdf Page 6 of 9 bypass. - appendectomy. - cardiac cath. - coronary angioplasty. - thrombectomy. - knee arthroplasty. - popliteal artery stent. 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 Bradycardic, asymptomatic. no acute distress. Vitals Time Taken: 10:06 AM, Height: 70 in, Weight: 260 lbs, BMI:  37.3, Temperature: 97.7 F, Pulse: 53 bpm, Respiratory Rate: 16 breaths/min, Blood Pressure: 186/75 mmHg. Respiratory Normal work of breathing on room air. General Notes: 09/15/2022: There has been more moisture related tissue breakdown. The callus is very soft and the underlying tissue is more open. Integumentary (Hair, Skin) Wound #2 status is Open. Original cause of wound was Gradually Appeared. The date acquired was: 07/08/2022. The wound has been in treatment 2 weeks. The wound is located on the [place]. The wound measures 0.5cm length x 3.7cm width x 0.1cm depth; 1.453cm^2 area and 0.145cm^3 volume. There is Fat Layer (Subcutaneous Tissue) exposed. There is no tunneling or undermining noted. There is [name] medium amount of serosanguineous drainage noted. The wound margin is  distinct with the outline attached to the wound base. There is medium (34-66%) red granulation within the wound bed. There is [name] medium (34-66%) amount of necrotic tissue within the wound bed including Adherent Slough. The periwound skin appearance had no abnormalities noted for moisture. The periwound skin appearance had no abnormalities noted for color. The periwound skin appearance exhibited: Callus. Periwound temperature was noted as No Abnormality. Assessment Active Problems ICD-10 Non-pressure chronic ulcer of other part of right foot with fat layer exposed Peripheral vascular disease, unspecified Atherosclerotic heart disease of native coronary artery without angina pectoris Essential (primary) hypertension Type 2 diabetes mellitus with hyperglycemia Type 2 diabetes mellitus with diabetic neuropathy, unspecified Cerebral infarction, unspecified Procedures Wound #2 Pre-procedure diagnosis of Wound #2 is [name] Diabetic Wound/Ulcer of the Lower Extremity located on the Right,Plantar Foot .Severity of Tissue Pre Debridement is: Fat layer exposed. There was [name] Excisional Skin/Subcutaneous Tissue Debridement with  [name] total area of 1.85 sq cm performed by Fredirick Maudlin, MD. With the following instrument(s): Curette to remove Non-Viable tissue/material. Material removed includes Callus, Subcutaneous Tissue, and Slough after achieving pain control using Lidocaine 5% topical ointment. No specimens were taken. [name] time out was conducted at 10:17, prior to the start of the procedure. [name] Minimum amount of bleeding was controlled with Pressure. The procedure was tolerated well. Post Debridement Measurements: 0.5cm length x 3.7cm width x 0.1cm depth; 0.145cm^3 volume. Character of Wound/Ulcer Post Debridement is improved. Severity of Tissue Post Debridement is: Fat layer exposed. Post procedure Diagnosis Wound #2: Same as Pre-Procedure General Notes: scribed for Dr. Celine Ahr by Adline Peals, RN. Plan Follow-up Appointments: [name], [name] ([id number]) [id number] Page 7 of 9 Return Appointment in 1 week. - Dr. Celine Ahr - room 2 Anesthetic: (In clinic) Topical Lidocaine 5% applied to wound bed Bathing/ Shower/ Hygiene: May shower and wash wound with soap and water. Edema Control - Lymphedema / SCD / Other: Elevate legs to the level of the heart or above for 30 minutes daily and/or when sitting for 3-4 times [name] day throughout the day. Avoid standing for long periods of time. Patient to wear own compression stockings every day. Moisturize legs daily. Off-Loading: Other: - forefront offloading shoe The following medication(s) was prescribed: lidocaine topical 5 % ointment ointment topical was prescribed at facility WOUND #2: - Foot Wound Laterality: Plantar, Right Cleanser: Soap and Water 1 x Per Day/30 Days Discharge Instructions: May shower and wash wound with dial antibacterial soap and water prior to dressing change. Cleanser: Wound Cleanser 1 x Per Day/30 Days Discharge Instructions: Cleanse the wound with wound cleanser prior to applying [name] clean dressing using gauze sponges, not  tissue or cotton balls. Prim Dressing: Sorbalgon AG Dressing 2x2 (in/in) 1 x Per Day/30 Days ary Discharge Instructions: Apply to wound bed as instructed Secondary Dressing: Optifoam Non-Adhesive Dressing, 4x4 in 1 x Per Day/30 Days Discharge Instructions: Apply over primary dressing as directed. Secondary Dressing: Woven Gauze Sponge, Non-Sterile 4x4 in 1 x Per Day/30 Days Discharge Instructions: Apply over primary dressing as directed. Secured With: Elastic Bandage 4 inch (ACE bandage) 1 x Per Day/30 Days Discharge Instructions: Secure with ACE bandage as directed. Secured With: [employer], 4.5x3.1 (in/yd) 1 x Per Day/30 Days Discharge Instructions: Secure with Kerlix as directed. 09/15/2022: There has been more moisture related tissue breakdown. The callus is very soft and the underlying tissue is more open. I used [name] curette to debride callus, slough, and subcutaneous tissue. I made an effort to try and remove  any areas of tissue that could potentially trap moisture and cause further tissue breakdown. We will continue silver alginate with foam donut and Kerlix and Ace bandage wrapping. We also discussed the importance of making sure his foot is extremely dry before reapplying his dressing. Follow-up in 1 week. Electronic Signature(s) Signed: 09/15/2022 11:05:15 AM By: Fredirick Maudlin MD FACS Entered By: Fredirick Maudlin on 09/15/2022 11:05:15 -------------------------------------------------------------------------------- HxROS Details Patient Name: Date of Service: [name], [place] [name]. 09/15/2022 10:00 [name] Medical Record Number: [id number] Patient Account Number: [id number] Date of Birth/Sex: Treating RN: December 20, 1955 (67 y.o. M) Primary Care Provider: Riki [name] Other Clinician: Referring Provider: Treating Provider/Extender: [name] in Treatment: 2 Information Obtained From Patient Respiratory Medical History: Positive for: Sleep  Apnea Cardiovascular Medical History: Positive for: Arrhythmia - [name]-Fib; Coronary Artery Disease - s/p CABG; Deep Vein Thrombosis; Hypertension; Myocardial Infarction; Peripheral Arterial Disease - s/p Fempop x 2 Past Medical History Notes: CVA x 3 Endocrine Medical History: Positive for: Type II Diabetes Time with diabetes: since 1997 Treated with: Insulin, Oral agents Blood sugar tested every day: Yes Tested : 2-3x per day Lobdell, Treshun [name] ([id number]) [id number] Page 8 of 9 Musculoskeletal Medical History: Past Medical History Notes: carpal tunnel syndrome Neurologic Medical History: Positive for: Neuropathy Past Medical History Notes: stroke Oncologic Medical History: Past Medical History Notes: skin cancer Immunizations Pneumococcal Vaccine: Received Pneumococcal Vaccination: Yes Received Pneumococcal Vaccination On or After 60th Birthday: Yes Implantable Devices None Hospitalization / Surgery History Type of Hospitalization/Surgery colonoscopy polypectomy peripheral vascular cath shoulder arthroscopy carpal tunnel release coronary artery bypass appendectomy cardiac cath coronary angioplasty thrombectomy knee arthroplasty popliteal artery stent Family and Social History Unknown History: Yes; Never smoker; Marital Status - Single; Alcohol Use: Never; Drug Use: No History; Caffeine Use: Rarely; Financial Concerns: No; Food, Clothing or Shelter Needs: No; Support System Lacking: No; Transportation Concerns: No Electronic Signature(s) Signed: 09/15/2022 11:31:02 AM By: Fredirick Maudlin MD FACS Entered By: Fredirick Maudlin on 09/15/2022 11:03:27 -------------------------------------------------------------------------------- SuperBill Details Patient Name: Date of Service: [name] [place] [name]. 09/15/2022 Medical Record Number: [id number] Patient Account Number: [id number] Date of Birth/Sex: Treating RN: [birth year] (67 y.o. M) Primary Care  Provider: Riki [name] Other Clinician: Referring Provider: Treating Provider/Extender: [name] in Treatment: 2 Diagnosis Coding ICD-10 Codes Code Description [phone] Non-pressure chronic ulcer of other part of right foot with fat layer exposed I73.9 Peripheral vascular disease, unspecified I25.10 Atherosclerotic heart disease of native coronary artery without angina pectoris I10 Essential (primary) hypertension E11.65 Type 2 diabetes mellitus with hyperglycemia E11.40 Type 2 diabetes mellitus with diabetic neuropathy, unspecified Watt, [place] [name] ([id number] Page 9 of 9 I63.9 Cerebral infarction, unspecified Facility Procedures : CPT4 Code: [id number] Description: [id number] - DEB SUBQ TISSUE 20 SQ CM/< ICD-10 Diagnosis Description L97.512 Non-pressure chronic ulcer of other part of right foot with fat layer exposed Modifier: Quantity: 1 Physician Procedures : CPT4 Code Description Modifier [id number] - WC PHYS LEVEL 3 - EST PT 25 ICD-10 Diagnosis Description L97.512 Non-pressure chronic ulcer of other part of right foot with fat layer exposed I73.9 Peripheral vascular disease, unspecified E11.40 Type 2  diabetes mellitus with diabetic neuropathy, unspecified I63.9 Cerebral infarction, unspecified Quantity: 1 : [id number] - WC PHYS SUBQ TISS 20 SQ CM ICD-10 Diagnosis Description L97.512 Non-pressure chronic ulcer of other part of right foot with fat layer exposed Quantity: 1 Electronic Signature(s) Signed: 09/15/2022 11:05:44 AM By: Fredirick Maudlin MD FACS Entered By: Fredirick Maudlin on 09/15/2022 11:05:43

## 2022-09-17 NOTE — Progress Notes (Signed)
[place], [place] [name] ([id number]) [id number] Page 1 of 7 Visit Report for 09/15/2022 Arrival Information Details Patient Name: Date of Service: [name] [name], [name] [name]. 09/15/2022 10:00 [name] [name] Medical Record Number: [id number] Patient Account Number: [id number] Date of Birth/Sex: Treating RN: 12-Jul-1956 (67 y.o. [name] Primary Care Darus Hershman: Riki Sheer Other Clinician: Referring Other Atienza: Treating Serena Petterson/Extender: Louann Sjogren in Treatment: 2 Visit Information History Since Last Visit Added or deleted any medications: No Patient Arrived: Ambulatory Any new allergies or adverse reactions: No Arrival Time: 10:02 Had [name] fall or experienced change in No Accompanied By: partner activities of daily living that may affect Transfer Assistance: None risk of falls: Patient Identification Verified: Yes Signs or symptoms of abuse/neglect since last visito No Secondary Verification Process Completed: Yes Hospitalized since last visit: No Implantable device outside of the clinic excluding No cellular tissue based products placed in the center since last visit: Has Dressing in Place as Prescribed: Yes Pain Present Now: No Electronic Signature(s) Signed: 09/16/2022 3:42:50 PM By: Adline Peals Entered By: Adline Peals on 09/15/2022 10:06:20 -------------------------------------------------------------------------------- Encounter Discharge Information Details Patient Name: Date of Service: [name] [name], [name] [name]. 09/15/2022 10:00 [name] [name] Medical Record Number: [id number] Patient Account Number: [id number] Date of Birth/Sex: Treating RN: 1956/03/02 (67 y.o. [name] Primary Care Jacqualin Shirkey: Riki Sheer Other Clinician: Referring Kaelani Kendrick: Treating Chanah Tidmore/Extender: Louann Sjogren in Treatment: 2 Encounter Discharge Information Items Post Procedure Vitals Discharge Condition:  Stable Temperature (F): 97.7 Ambulatory Status: Ambulatory Pulse (bpm): 53 Discharge Destination: Home Respiratory Rate (breaths/min): 16 Transportation: Private Auto Blood Pressure (mmHg): 186/75 Accompanied By: partner Schedule Follow-up Appointment: Yes Clinical Summary of Care: Patient Declined Electronic Signature(s) Signed: 09/16/2022 3:42:50 PM By: Adline Peals Entered By: Adline Peals on 09/15/2022 10:22:37 -------------------------------------------------------------------------------- Lower Extremity Assessment Details Patient Name: Date of Service: [name] [name], [name] [name]. 09/15/2022 10:00 [name] [name] Medical Record Number: [id number] Patient Account Number: [id number] Date of Birth/Sex: Treating RN: 1956/07/03 (67 y.o. [name] Primary Care Morgann Woodburn: Riki Sheer Other Clinician: Referring Demareon Coldwell: Treating Payal Stanforth/Extender: Louann Sjogren in Treatment: 2 Edema Assessment Assessed: [Left: No] [Right: No] P[Left: EGG, Reuben [name] [id number] [[id number] Page 2 of 7] [Left: Edema] [Right: :] Calf Left: Right: Point of Measurement: From Medial Instep 43 cm Ankle Left: Right: Point of Measurement: From Medial Instep 28.1 cm Electronic Signature(s) Signed: 09/16/2022 3:42:50 PM By: Adline Peals Entered By: Adline Peals on 09/15/2022 10:06:32 -------------------------------------------------------------------------------- Multi Wound Chart Details Patient Name: Date of Service: [name] [name], [name] [name]. 09/15/2022 10:00 [name] [name] Medical Record Number: [id number] Patient Account Number: [id number] Date of Birth/Sex: Treating RN: 03/23/1956 (67 y.o. [name]) Primary Care Hiroko Tregre: Riki Sheer Other Clinician: Referring Que Meneely: Treating Bless Belshe/Extender: Louann Sjogren in Treatment: 2 Vital Signs Height(in): 70 Pulse(bpm): 7 Weight(lbs): 260 Blood Pressure(mmHg):  186/75 Body Mass Index(BMI): 37.3 Temperature(F): 97.7 Respiratory Rate(breaths/min): 16 [2:Photos:] [N/[name]:N/[name]] Right, Plantar Foot N/[name] N/[name] Wound Location: Gradually Appeared N/[name] N/[name] Wounding Event: Diabetic Wound/Ulcer of the Lower N/[name] N/[name] Primary Etiology: Extremity Sleep Apnea, Arrhythmia, Coronary N/[name] N/[name] Comorbid History: Artery Disease, Deep Vein Thrombosis, Hypertension, Myocardial Infarction, Peripheral Arterial Disease, Type II Diabetes, Neuropathy 07/08/2022 N/[name] N/[name] Date Acquired: 2 N/[name] N/[name] Weeks of Treatment: Open N/[name] N/[name] Wound Status: No N/[name] N/[name] Wound Recurrence: 0.5x3.7x0.1 N/[name] N/[name] Measurements L x W x D (cm) 1.453 N/[name] N/[name] [name] (cm) : rea 0.145 N/[name] N/[name] Volume (cm) : 43.10% N/[name] N/[name] % Reduction in [name] rea: 43.10% N/[name] N/[name] % Reduction  in Volume: Grade 1 N/[name] N/[name] Classification: Medium N/[name] N/[name] Exudate [name] mount: Serosanguineous N/[name] N/[name] Exudate Type: red, brown N/[name] N/[name] Exudate Color: Distinct, outline attached N/[name] N/[name] Wound Margin: Medium (34-66%) N/[name] N/[name] Granulation [name] mount: Red N/[name] N/[name] Granulation Quality: Medium (34-66%) N/[name] N/[name] Necrotic [name] mount: Fat Layer (Subcutaneous Tissue): Yes N/[name] N/[name] Exposed Structures: Fascia: No Tendon: No Muscle: No Joint: No Bone: No Medium (34-66%) N/[name] N/[name] Epithelialization: [name] [name] ([id number] Page 3 of 7 Debridement - Excisional N/[name] N/[name] Debridement: 10:17 N/[name] N/[name] Pre-procedure Verification/Time Out Taken: Lidocaine 5% topical ointment N/[name] N/[name] Pain Control: Callus, Subcutaneous, Slough N/[name] N/[name] Tissue Debrided: Skin/Subcutaneous Tissue N/[name] N/[name] Level: 1.85 N/[name] N/[name] Debridement [name] (sq cm): rea Curette N/[name] N/[name] Instrument: Minimum N/[name] N/[name] Bleeding: Pressure N/[name] N/[name] Hemostasis [name] chieved: Procedure was tolerated well N/[name] N/[name] Debridement Treatment Response: 0.5x3.7x0.1 N/[name] N/[name] Post Debridement Measurements L x W x D (cm) 0.145 N/[name] N/[name] Post Debridement Volume:  (cm) Callus: Yes N/[name] N/[name] Periwound Skin Texture: No Abnormalities Noted N/[name] N/[name] Periwound Skin Moisture: No Abnormalities Noted N/[name] N/[name] Periwound Skin Color: No Abnormality N/[name] N/[name] Temperature: Debridement N/[name] N/[name] Procedures Performed: Treatment Notes Wound #2 (Foot) Wound Laterality: Plantar, Right Cleanser Soap and Water Discharge Instruction: May shower and wash wound with dial antibacterial soap and water prior to dressing change. Wound Cleanser Discharge Instruction: Cleanse the wound with wound cleanser prior to applying [name] clean dressing using gauze sponges, not tissue or cotton balls. Peri-Wound Care Topical Primary Dressing Sorbalgon AG Dressing 2x2 (in/in) Discharge Instruction: Apply to wound bed as instructed Secondary Dressing Optifoam Non-Adhesive Dressing, 4x4 in Discharge Instruction: Apply over primary dressing as directed. Woven Gauze Sponge, Non-Sterile 4x4 in Discharge Instruction: Apply over primary dressing as directed. Secured With Elastic Bandage 4 inch (ACE bandage) Discharge Instruction: Secure with ACE bandage as directed. Kerlix Roll Sterile, 4.5x3.1 (in/yd) Discharge Instruction: Secure with Kerlix as directed. Compression Wrap Compression Stockings Add-Ons Electronic Signature(s) Signed: 09/15/2022 10:44:48 AM By: Fredirick Maudlin MD FACS Entered By: Fredirick Maudlin on 09/15/2022 10:44:48 -------------------------------------------------------------------------------- Multi-Disciplinary Care Plan Details Patient Name: Date of Service: [name] [name], [name] [place] RK [name]. 09/15/2022 10:00 [name] [name] Medical Record Number: [id number] Patient Account Number: [id number] Date of Birth/Sex: Treating RN: 10/27/1955 (67 y.o. [name] Primary Care [place]: Riki Sheer Other Clinician: Referring Petrina Melby: Treating Amor Packard/Extender: Louann Sjogren in Treatment: 2 Active Inactive [place], [place] ([id number])  [id number] Page 4 of 7 Abuse / Safety / Falls / Self Care Management Nursing Diagnoses: Impaired physical mobility Potential for falls Goals: Patient will not experience any injury related to falls Date Initiated: 08/31/2022 Target Resolution Date: 10/23/2022 Goal Status: Active Patient/caregiver will verbalize/demonstrate measures taken to improve the patient's personal safety Date Initiated: 08/31/2022 Target Resolution Date: 10/23/2022 Goal Status: Active Interventions: Provide education on basic hygiene Provide education on fall prevention Notes: Wound/Skin Impairment Nursing Diagnoses: Impaired tissue integrity Knowledge deficit related to ulceration/compromised skin integrity Goals: Patient/caregiver will verbalize understanding of skin care regimen Date Initiated: 08/31/2022 Target Resolution Date: 10/23/2022 Goal Status: Active Interventions: Assess patient/caregiver ability to obtain necessary supplies Assess patient/caregiver ability to perform ulcer/skin care regimen upon admission and as needed Assess ulceration(s) every visit Treatment Activities: Skin care regimen initiated : 08/31/2022 Topical wound management initiated : 08/31/2022 Notes: Electronic Signature(s) Signed: 09/16/2022 3:42:50 PM By: Adline Peals Entered By: Adline Peals on 09/15/2022 10:14:48 -------------------------------------------------------------------------------- Pain Assessment Details Patient Name: Date of Service: [name] [name], [name] [place] [name]. 09/15/2022 10:00 [name] [name] Medical Record Number: [id number]  Patient Account Number: [id number] Date of Birth/Sex: Treating RN: [birth year] (67 y.o. [name] Primary Care Shalece Staffa: Riki Sheer Other Clinician: Referring Tj Kitchings: Treating Goebel Hellums/Extender: Louann Sjogren in Treatment: 2 Active Problems Location of Pain Severity and Description of Pain Patient Has Paino No Site  Locations Rate the pain. [place], [place] [name] ([id number]) [id number] Page 5 of 7 Rate the pain. Current Pain Level: 0 Pain Management and Medication Current Pain Management: Electronic Signature(s) Signed: 09/16/2022 3:42:50 PM By: Adline Peals Entered By: Adline Peals on 09/15/2022 10:06:28 -------------------------------------------------------------------------------- Patient/Caregiver Education Details Patient Name: Date of Service: Iglesias, [name] [name]. 2/27/2024andnbsp10:00 [name] [name] Medical Record Number: [id number] Patient Account Number: [id number] Date of Birth/Gender: Treating RN: 04/06/1956 (67 y.o. [name] Primary Care Physician: Riki Sheer Other Clinician: Referring Physician: Treating Physician/Extender: Louann Sjogren in Treatment: 2 Education Assessment Education Provided To: Patient Education Topics Provided Wound Debridement: Methods: Explain/Verbal Responses: Reinforcements needed, State content correctly Electronic Signature(s) Signed: 09/16/2022 3:42:50 PM By: Adline Peals Entered By: Adline Peals on 09/15/2022 10:14:59 -------------------------------------------------------------------------------- Wound Assessment Details Patient Name: Date of Service: [name] [name], [name] [place] [name]. 09/15/2022 10:00 [name] [name] Medical Record Number: [id number] Patient Account Number: [id number] Date of Birth/Sex: Treating RN: 03-21-1956 (67 y.o. [name] Primary Care Jahrel Borthwick: Riki Sheer Other Clinician: Referring Darell Saputo: Treating Amatullah Christy/Extender: Louann Sjogren in Treatment: 2 Wound Status Wound Number: 2 Primary Diabetic Wound/Ulcer of the Lower Extremity Etiology: Wound Location: Right, Plantar Foot Wound Open Wounding Event: Gradually Appeared Status: Date Acquired: 07/08/2022 Comorbid Sleep Apnea, Arrhythmia, Coronary Artery Disease, Deep Vein Weeks Of  Treatment: 2 History: Thrombosis, Hypertension, Myocardial Infarction, Peripheral Arterial Clustered Wound: No Disease, Type II Diabetes, Neuropathy [name] [name], [name] [name] ([id number] Page 6 of 7 Photos Wound Measurements Length: (cm) 0.5 Width: (cm) 3.7 Depth: (cm) 0.1 Area: (cm) 1.453 Volume: (cm) 0.145 % Reduction in Area: 43.1% % Reduction in Volume: 43.1% Epithelialization: Medium (34-66%) Tunneling: No Undermining: No Wound Description Classification: Grade 1 Wound Margin: Distinct, outline attached Exudate Amount: Medium Exudate Type: Serosanguineous Exudate Color: red, brown Foul Odor After Cleansing: No Slough/Fibrino Yes Wound Bed Granulation Amount: Medium (34-66%) Exposed Structure Granulation Quality: Red Fascia Exposed: No Necrotic Amount: Medium (34-66%) Fat Layer (Subcutaneous Tissue) Exposed: Yes Necrotic Quality: Adherent Slough Tendon Exposed: No Muscle Exposed: No Joint Exposed: No Bone Exposed: No Periwound Skin Texture Texture Color No Abnormalities Noted: No No Abnormalities Noted: Yes Callus: Yes Temperature / Pain Temperature: No Abnormality Moisture No Abnormalities Noted: Yes Treatment Notes Wound #2 (Foot) Wound Laterality: Plantar, Right Cleanser Soap and Water Discharge Instruction: May shower and wash wound with dial antibacterial soap and water prior to dressing change. Wound Cleanser Discharge Instruction: Cleanse the wound with wound cleanser prior to applying [name] clean dressing using gauze sponges, not tissue or cotton balls. Peri-Wound Care Topical Primary Dressing Sorbalgon AG Dressing 2x2 (in/in) Discharge Instruction: Apply to wound bed as instructed Secondary Dressing Optifoam Non-Adhesive Dressing, 4x4 in Discharge Instruction: Apply over primary dressing as directed. Woven Gauze Sponge, Non-Sterile 4x4 in Discharge Instruction: Apply over primary dressing as directed. Secured  With Elastic Bandage 4 inch (ACE bandage) Discharge Instruction: Secure with ACE bandage as directed. [place], [place] [name] ([id number]) [id number] Page 7 of 7 Kerlix Roll Sterile, 4.5x3.1 (in/yd) Discharge Instruction: Secure with Kerlix as directed. Compression Wrap Compression Stockings Add-Ons Electronic Signature(s) Signed: 09/16/2022 3:42:50 PM By: Adline Peals Entered By: Adline Peals on 09/15/2022 10:13:10 -------------------------------------------------------------------------------- Vitals Details Patient Name: Date of Service: [place],  [name] [name]. 09/15/2022 10:00 [name] [name] Medical Record Number: [id number] Patient Account Number: [id number] Date of Birth/Sex: Treating RN: 1956/07/08 (67 y.o. [name] Primary Care Jannette Cotham: Riki Sheer Other Clinician: Referring Pj Zehner: Treating Jaimeson Gopal/Extender: Louann Sjogren in Treatment: 2 Vital Signs Time Taken: 10:06 Temperature (F): 97.7 Height (in): 70 Pulse (bpm): 53 Weight (lbs): 260 Respiratory Rate (breaths/min): 16 Body Mass Index (BMI): 37.3 Blood Pressure (mmHg): 186/75 Reference Range: 80 - 120 mg / dl Electronic Signature(s) Signed: 09/16/2022 3:42:50 PM By: Adline Peals Entered By: Adline Peals on 09/15/2022 10:08:20

## 2022-09-23 ENCOUNTER — Encounter (HOSPITAL_BASED_OUTPATIENT_CLINIC_OR_DEPARTMENT_OTHER): Payer: Medicare HMO | Admitting: General Surgery

## 2022-09-24 ENCOUNTER — Telehealth: Payer: Self-pay | Admitting: Internal Medicine

## 2022-09-24 NOTE — Telephone Encounter (Signed)
Received patient's Eliquis PAP. Missing from it are: proof of household income, OOP Rx expense report from pharamcy. LM for patient with this info. Also sent him a message in [place]

## 2022-09-28 ENCOUNTER — Encounter (HOSPITAL_BASED_OUTPATIENT_CLINIC_OR_DEPARTMENT_OTHER): Payer: Medicare HMO | Attending: General Surgery | Admitting: General Surgery

## 2022-09-28 DIAGNOSIS — L97512 Non-pressure chronic ulcer of other part of right foot with fat layer exposed: Secondary | ICD-10-CM | POA: Insufficient documentation

## 2022-09-28 DIAGNOSIS — E1151 Type 2 diabetes mellitus with diabetic peripheral angiopathy without gangrene: Secondary | ICD-10-CM | POA: Insufficient documentation

## 2022-09-28 DIAGNOSIS — I872 Venous insufficiency (chronic) (peripheral): Secondary | ICD-10-CM | POA: Insufficient documentation

## 2022-09-28 DIAGNOSIS — I2510 Atherosclerotic heart disease of native coronary artery without angina pectoris: Secondary | ICD-10-CM | POA: Insufficient documentation

## 2022-09-28 DIAGNOSIS — E1165 Type 2 diabetes mellitus with hyperglycemia: Secondary | ICD-10-CM | POA: Diagnosis not present

## 2022-09-28 DIAGNOSIS — Z85828 Personal history of other malignant neoplasm of skin: Secondary | ICD-10-CM | POA: Diagnosis not present

## 2022-09-28 DIAGNOSIS — I4891 Unspecified atrial fibrillation: Secondary | ICD-10-CM | POA: Insufficient documentation

## 2022-09-28 DIAGNOSIS — Z8673 Personal history of transient ischemic attack (TIA), and cerebral infarction without residual deficits: Secondary | ICD-10-CM | POA: Diagnosis not present

## 2022-09-28 DIAGNOSIS — I110 Hypertensive heart disease with heart failure: Secondary | ICD-10-CM | POA: Diagnosis not present

## 2022-09-28 DIAGNOSIS — E1140 Type 2 diabetes mellitus with diabetic neuropathy, unspecified: Secondary | ICD-10-CM | POA: Diagnosis not present

## 2022-09-28 DIAGNOSIS — G4733 Obstructive sleep apnea (adult) (pediatric): Secondary | ICD-10-CM | POA: Insufficient documentation

## 2022-09-28 DIAGNOSIS — I5032 Chronic diastolic (congestive) heart failure: Secondary | ICD-10-CM | POA: Diagnosis not present

## 2022-09-28 DIAGNOSIS — E11621 Type 2 diabetes mellitus with foot ulcer: Secondary | ICD-10-CM | POA: Diagnosis not present

## 2022-09-28 DIAGNOSIS — Z7902 Long term (current) use of antithrombotics/antiplatelets: Secondary | ICD-10-CM | POA: Diagnosis not present

## 2022-09-28 DIAGNOSIS — Z7901 Long term (current) use of anticoagulants: Secondary | ICD-10-CM | POA: Insufficient documentation

## 2022-09-29 NOTE — Progress Notes (Signed)
[place], [place] A ([id number]) [id number] Page 1 of 9 Visit Report for 09/28/2022 Chief Complaint Document Details Patient Name: Date of Service: [place], [place] R[place] A. 09/28/2022 2:15 PM Medical Record Number: [id number] Patient Account Number: [id number] Date of Birth/Sex: Treating RN: 1956-01-03 (67 y.o. M) Primary Care Provider: Riki Sheer Other Clinician: Referring Provider: Treating Provider/Extender: Louann Sjogren in Treatment: 4 Information Obtained from: Patient Chief Complaint 05/14/2020; patient is here for review of an abrasion injury on the left lateral calf 08/31/2022: [name] (1st metatarsal base, plantar) Electronic Signature(s) Signed: 09/28/2022 5:06:33 PM By: Fredirick Maudlin MD FACS Entered By: Fredirick Maudlin on 09/28/2022 17:06:33 -------------------------------------------------------------------------------- Debridement Details Patient Name: Date of Service: Isabel Caprice, MA [place] A. 09/28/2022 2:15 PM Medical Record Number: [id number] Patient Account Number: [id number] Date of Birth/Sex: Treating RN: December 24, 1955 (67 y.o. [name] Primary Care Provider: Riki Sheer Other Clinician: Referring Provider: Treating Provider/Extender: Louann Sjogren in Treatment: 4 Debridement Performed for Assessment: Wound #2 Right,Plantar Foot Performed By: Physician Fredirick Maudlin, MD Debridement Type: Debridement Severity of Tissue Pre Debridement: Fat layer exposed Level of Consciousness (Pre-procedure): Awake and Alert Pre-procedure Verification/Time Out Yes - 14:39 Taken: Start Time: 14:39 Pain Control: Lidocaine 5% topical ointment T Area Debrided (L x W): otal 2 (cm) x 3.5 (cm) = 7 (cm) Tissue and other material debrided: Non-Viable, Callus, Slough, Subcutaneous, Slough Level: Skin/Subcutaneous Tissue Debridement Description: Excisional Instrument: Curette Bleeding:  Minimum Hemostasis Achieved: Pressure Response to Treatment: Procedure was tolerated well Level of Consciousness (Post- Awake and Alert procedure): Post Debridement Measurements of Total Wound Length: (cm) 2 Width: (cm) 3.5 Depth: (cm) 0.1 Volume: (cm) 0.55 Character of Wound/Ulcer Post Debridement: Improved Severity of Tissue Post Debridement: Fat layer exposed Post Procedure Diagnosis Same as Pre-procedure Notes Scribed for Dr. Celine Ahr by Adline Peals, RN Electronic Signature(s) Signed: 09/28/2022 4:59:21 PM By: Adline Peals Signed: 09/29/2022 7:40:18 AM By: Fredirick Maudlin MD [place], [place] A ([id number]) [id number] Page 2 of 9 Signed: 09/29/2022 7:40:18 AM By: Fredirick Maudlin MD FACS Entered By: Adline Peals on 09/28/2022 14:46:20 -------------------------------------------------------------------------------- HPI Details Patient Name: Date of Service: Isabel Caprice, [place] A. 09/28/2022 2:15 PM Medical Record Number: [id number] Patient Account Number: [id number] Date of Birth/Sex: Treating RN: [birth year] (67 y.o. M) Primary Care Provider: Riki Sheer Other Clinician: Referring Provider: Treating Provider/Extender: Louann Sjogren in Treatment: 4 History of Present Illness HPI Description: ADMISSION 05/14/2020; this is a 67 year old man with multiple medical issues. Predominantly he has type 2 diabetes with a history of peripheral neuropathy and also history of fairly significant PAD. He had a left superficial femoral to posterior tibial artery bypass in February 2017 he also had an atherectomy and angioplasty by Dr. Gwenlyn Found of the right popliteal artery in 2016. He is supposed to be getting arterial studies annually however this was interrupted last year because of the pandemic. He tells [place] he was at [facility] 2 weeks ago was getting out of of the scooter and traumatized his left lateral lower leg. There was a  lot of bleeding as the patient is on Plavix and Eliquis. They have been dressing this with Neosporin and doing a fairly good job. Wound measures 2.5 x 3.5 it does not have any depth he does not have a wound history in his legs outside of surgery however he does have chronic edema and skin changes suggestive of chronic venous disease possibly some degree of lymphedema as well. Past medical history includes type 2 diabetes with peripheral  neuropathy and gait instability, lumbar spondylosis, obstructive sleep apnea, history of a left pontine CVA, basal cell skin cancer, atrial fibrillation on anticoagulation, significant PAD as noted with a left superficial femoral to posterior tibial bypass in February 2017 and a right popliteal atherectomy and angioplasty by Dr. Gwenlyn Found in 30th 2016. He also has a history of coronary artery disease with an MI in 2002 hypertension hyperlipidemia and heart failure with preserved ejection fraction His last arterial studies I can see in epic were on 03/10/2018 this showed a right ABI of 0.69 and a right TBI of 0.5 with monophasic waveforms on the right. On the left his ABI was 1.20 with a TBI of 0.92 and triphasic waveforms. He has not had arterial studies since. Our nurse in the clinic got an ABI on the left of 1.1 11/2; left anterior leg wound in the setting of chronic venous insufficiency. Wound was initially trauma. We have been using Hydrofera Blue under compression he has home health.. The wound looks a lot better today with improvement in surface area 11/16; left anterior leg wound in the setting of chronic venous insufficiency. Wound was initially trauma. We have been using Hydrofera Blue under compression. The patient is closed today. He is supposed to follow-up with vein and vascular with regards to arterial insufficiency nevertheless his leg wounds are 12/3; apparently 2 weeks ago when they were putting on their stockings they managed to get 3 wounds on the left  anterior lower leg from abrasion when putting on the stockings. Home health came by the week of Thanksgiving and put Hydrofera Blue 4-layer wrap on this and there is only one superficial area remaining. The patient and his wife complained about the difficulties getting stockings on I think we are using 20/30. We will order bilateral external compression stockings which should be easier. 12/10; wound on the left anterior lower leg is closed. He has chronic venous insufficiency we ordered him Farrow wrap stockings unfortunately he did not bring these in. 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 He says that it has been present since mid December. He is currently residing in [facility] until March and a podiatrist has been looking after him there. They have been simply painting the area with Betadine. He has not had any lower extremity arterial studies since 2019, at which time his right ABI was 0.69. Measured in clinic today, it was 0.71. He is not aware of his most recent hemoglobin A1c, but historically he has had exceptionally poor control. On the basis of his right first metatarsal, there is a small crescent shaped wound. There is surrounding eschar and callus. There is no malodor or purulent drainage. 09/08/2022: The original wound is smaller today and fairly clean, but there is some discoloration and a pulpy texture to the adjacent callus. Underneath this, the tissue is open exposing the fat layer. It looks like perhaps there was a crack in the callus and moisture got under the skin and caused breakdown. 09/15/2022: There has been more moisture related tissue breakdown. The callus is very soft and the underlying tissue is more open. 09/28/2022: The wound looks much better. He has done a good job keeping it dry. There is some callus overlying much of the wound surface. There is slough on the exposed open areas. Electronic  Signature(s) Signed: 09/28/2022 5:07:18 PM By: Fredirick Maudlin MD FACS Entered By: Fredirick Maudlin on 09/28/2022 17:07:18 -------------------------------------------------------------------------------- Physical Exam Details  Patient Name: Date of Service: [place], [place] A. 09/28/2022 2:15 PM Medical Record Number: [id number] Patient Account Number: [id number] Date of Birth/Sex: Treating RN: 08-10-1955 (67 y.o. M) Primary Care Provider: Riki Sheer Other Clinician: Referring Provider: Treating Provider/Extender: Louann Sjogren in Treatment: [place], [place] A ([id number]) [id number] Page 3 of 9 Constitutional Hypertensive, asymptomatic. Bradycardic, asymptomatic. . . no acute distress. Respiratory Normal work of breathing on room air. Notes 09/28/2022: The wound looks much better. He has done a good job keeping it dry. There is some callus overlying much of the wound surface. There is slough on the exposed open areas. Electronic Signature(s) Signed: 09/28/2022 5:08:08 PM By: Fredirick Maudlin MD FACS Entered By: Fredirick Maudlin on 09/28/2022 17:08:08 -------------------------------------------------------------------------------- Physician Orders Details Patient Name: Date of Service: Isabel Caprice, MA [place] A. 09/28/2022 2:15 PM Medical Record Number: [id number] Patient Account Number: [id number] Date of Birth/Sex: Treating RN: 1956/04/25 (67 y.o. [name] Primary Care Provider: Riki Sheer Other Clinician: Referring Provider: Treating Provider/Extender: Louann Sjogren in Treatment: 4 Verbal / Phone Orders: No Diagnosis Coding ICD-10 Coding Code Description [phone] Non-pressure chronic ulcer of other part of right foot with fat layer exposed I73.9 Peripheral vascular disease, unspecified I25.10 Atherosclerotic heart disease of native coronary artery without angina pectoris I10 Essential  (primary) hypertension E11.65 Type 2 diabetes mellitus with hyperglycemia E11.40 Type 2 diabetes mellitus with diabetic neuropathy, unspecified I63.9 Cerebral infarction, unspecified Follow-up Appointments ppointment in 2 weeks. - Dr. Celine Ahr - room 2 Return A Anesthetic (In clinic) Topical Lidocaine 5% applied to wound bed Bathing/ Shower/ Hygiene May shower and wash wound with soap and water. Edema Control - Lymphedema / SCD / Other Elevate legs to the level of the heart or above for 30 minutes daily and/or when sitting for 3-4 times a day throughout the day. Avoid standing for long periods of time. Patient to wear own compression stockings every day. Moisturize legs daily. Off-Loading Other: - forefront offloading shoe Wound Treatment Wound #2 - Foot Wound Laterality: Plantar, Right Cleanser: Soap and Water 1 x Per Day/30 Days Discharge Instructions: May shower and wash wound with dial antibacterial soap and water prior to dressing change. Cleanser: Wound Cleanser 1 x Per Day/30 Days Discharge Instructions: Cleanse the wound with wound cleanser prior to applying a clean dressing using gauze sponges, not tissue or cotton balls. Prim Dressing: Sorbalgon AG Dressing 2x2 (in/in) 1 x Per Day/30 Days ary Discharge Instructions: Apply to wound bed as instructed Secondary Dressing: Optifoam Non-Adhesive Dressing, 4x4 in 1 x Per Day/30 Days Discharge Instructions: Apply over primary dressing as directed. Secondary Dressing: Woven Gauze Sponge, Non-Sterile 4x4 in 1 x Per Day/30 Days Discharge Instructions: Apply over primary dressing as directed. [place], [place] A ([id number]) [id number] Page 4 of 9 Secured With: Elastic Bandage 4 inch (ACE bandage) 1 x Per Day/30 Days Discharge Instructions: Secure with ACE bandage as directed. Secured With: [employer], 4.5x3.1 (in/yd) 1 x Per Day/30 Days Discharge Instructions: Secure with Kerlix as directed. Patient  Medications llergies: No Known Drug Allergies A Notifications Medication Indication Start End 09/28/2022 lidocaine DOSE topical 5 % ointment - ointment topical Electronic Signature(s) Signed: 09/29/2022 7:40:18 AM By: Fredirick Maudlin MD FACS Previous Signature: 09/28/2022 4:59:21 PM Version By: Adline Peals Entered By: Fredirick Maudlin on 09/28/2022 17:08:28 -------------------------------------------------------------------------------- Problem List Details Patient Name: Date of Service: Isabel Caprice, [place] A. 09/28/2022 2:15 PM Medical Record Number: [id number] Patient Account Number: [id number] Date of Birth/Sex: Treating RN: Jul 13, 1956 (66 y.o.  M) Primary Care Provider: Riki Sheer Other Clinician: Referring Provider: Treating Provider/Extender: Louann Sjogren in Treatment: 4 Active Problems ICD-10 Encounter Code Description Active Date MDM Diagnosis L97.512 Non-pressure chronic ulcer of other part of right foot with fat layer exposed 08/31/2022 No Yes I73.9 Peripheral vascular disease, unspecified 08/31/2022 No Yes I25.10 Atherosclerotic heart disease of native coronary artery without angina pectoris 08/31/2022 No Yes I10 Essential (primary) hypertension 08/31/2022 No Yes E11.65 Type 2 diabetes mellitus with hyperglycemia 08/31/2022 No Yes E11.40 Type 2 diabetes mellitus with diabetic neuropathy, unspecified 08/31/2022 No Yes I63.9 Cerebral infarction, unspecified 08/31/2022 No Yes Inactive Problems Resolved Problems Electronic Signature(s) [place], [place] A ([id number]) [id number] Page 5 of 9 Signed: 09/28/2022 5:06:14 PM By: Fredirick Maudlin MD FACS Entered By: Fredirick Maudlin on 09/28/2022 17:06:14 -------------------------------------------------------------------------------- Progress Note Details Patient Name: Date of Service: Isabel Caprice, MA [place] A. 09/28/2022 2:15 PM Medical Record Number: [id number] Patient Account Number:  [id number] Date of Birth/Sex: Treating RN: [birth year] (67 y.o. M) Primary Care Provider: Riki Sheer Other Clinician: Referring Provider: Treating Provider/Extender: Louann Sjogren in Treatment: 4 Subjective Chief Complaint Information obtained from Patient 05/14/2020; patient is here for review of an abrasion injury on the left lateral calf 08/31/2022: [name] (1st metatarsal base, plantar) History of Present Illness (HPI) ADMISSION 05/14/2020; this is a 67 year old man with multiple medical issues. Predominantly he has type 2 diabetes with a history of peripheral neuropathy and also history of fairly significant PAD. He had a left superficial femoral to posterior tibial artery bypass in February 2017 he also had an atherectomy and angioplasty by Dr. Gwenlyn Found of the right popliteal artery in 2016. He is supposed to be getting arterial studies annually however this was interrupted last year because of the pandemic. He tells [place] he was at [facility] 2 weeks ago was getting out of of the scooter and traumatized his left lateral lower leg. There was a lot of bleeding as the patient is on Plavix and Eliquis. They have been dressing this with Neosporin and doing a fairly good job. Wound measures 2.5 x 3.5 it does not have any depth he does not have a wound history in his legs outside of surgery however he does have chronic edema and skin changes suggestive of chronic venous disease possibly some degree of lymphedema as well. Past medical history includes type 2 diabetes with peripheral neuropathy and gait instability, lumbar spondylosis, obstructive sleep apnea, history of a left pontine CVA, basal cell skin cancer, atrial fibrillation on anticoagulation, significant PAD as noted with a left superficial femoral to posterior tibial bypass in February 2017 and a right popliteal atherectomy and angioplasty by Dr. Gwenlyn Found in 30th 2016. He also has a history of coronary  artery disease with an MI in 2002 hypertension hyperlipidemia and heart failure with preserved ejection fraction His last arterial studies I can see in epic were on 03/10/2018 this showed a right ABI of 0.69 and a right TBI of 0.5 with monophasic waveforms on the right. On the left his ABI was 1.20 with a TBI of 0.92 and triphasic waveforms. He has not had arterial studies since. Our nurse in the clinic got an ABI on the left of 1.1 11/2; left anterior leg wound in the setting of chronic venous insufficiency. Wound was initially trauma. We have been using Hydrofera Blue under compression he has home health.. The wound looks a lot better today with improvement in surface area 11/16; left anterior leg wound in the setting of  chronic venous insufficiency. Wound was initially trauma. We have been using Hydrofera Blue under compression. The patient is closed today. He is supposed to follow-up with vein and vascular with regards to arterial insufficiency nevertheless his leg wounds are 12/3; apparently 2 weeks ago when they were putting on their stockings they managed to get 3 wounds on the left anterior lower leg from abrasion when putting on the stockings. Home health came by the week of Thanksgiving and put Hydrofera Blue 4-layer wrap on this and there is only one superficial area remaining. The patient and his wife complained about the difficulties getting stockings on I think we are using 20/30. We will order bilateral external compression stockings which should be easier. 12/10; wound on the left anterior lower leg is closed. He has chronic venous insufficiency we ordered him Farrow wrap stockings unfortunately he did not bring these in. 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 He says that it has been present since mid December. He is currently residing in [facility] until March and a podiatrist has been looking after him there.  They have been simply painting the area with Betadine. He has not had any lower extremity arterial studies since 2019, at which time his right ABI was 0.69. Measured in clinic today, it was 0.71. He is not aware of his most recent hemoglobin A1c, but historically he has had exceptionally poor control. On the basis of his right first metatarsal, there is a small crescent shaped wound. There is surrounding eschar and callus. There is no malodor or purulent drainage. 09/08/2022: The original wound is smaller today and fairly clean, but there is some discoloration and a pulpy texture to the adjacent callus. Underneath this, the tissue is open exposing the fat layer. It looks like perhaps there was a crack in the callus and moisture got under the skin and caused breakdown. 09/15/2022: There has been more moisture related tissue breakdown. The callus is very soft and the underlying tissue is more open. 09/28/2022: The wound looks much better. He has done a good job keeping it dry. There is some callus overlying much of the wound surface. There is slough on the exposed open areas. Patient History Information obtained from Patient. Family History Unknown History. Social History Never smoker, Marital Status - Single, Alcohol Use - Never, Drug Use - No History, Caffeine Use - Rarely. Medical History Respiratory Patient has history of Sleep Apnea Cardiovascular Patient has history of Arrhythmia - A-Fib, Coronary Artery Disease - s/p CABG, Deep Vein Thrombosis, Hypertension, Myocardial Infarction, Peripheral Arterial [place], [place] A ([id number]) [phone].pdf Page 6 of 9 Disease - s/p Fempop x 2 Endocrine Patient has history of Type II Diabetes Neurologic Patient has history of Neuropathy Hospitalization/Surgery History - colonoscopy. - polypectomy. - peripheral vascular cath. - shoulder arthroscopy. - carpal tunnel release. - coronary artery bypass. - appendectomy. - cardiac cath.  - coronary angioplasty. - thrombectomy. - knee arthroplasty. - popliteal artery stent. 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 Bradycardic, asymptomatic. no acute distress. Vitals Time Taken: 2:18 AM, Height: 70 in, Weight: 260 lbs, BMI: 37.3, Temperature: 98.6 F, Pulse: 53 bpm, Respiratory Rate: 20 breaths/min, Blood Pressure: 167/78 mmHg, Capillary Blood Glucose: 137 mg/dl. Respiratory Normal work of breathing on room air. General Notes: 09/28/2022: The wound looks much better. He has done a good job keeping  it dry. There is some callus overlying much of the wound surface. There is slough on the exposed open areas. Integumentary (Hair, Skin) Wound #2 status is Open. Original cause of wound was Gradually Appeared. The date acquired was: 07/08/2022. The wound has been in treatment 4 weeks. The wound is located on the [place]. The wound measures 2cm length x 3.5cm width x 0.1cm depth; 5.498cm^2 area and 0.55cm^3 volume. There is Fat Layer (Subcutaneous Tissue) exposed. There is no tunneling or undermining noted. There is a medium amount of serosanguineous drainage noted. The wound margin is distinct with the outline attached to the wound base. There is medium (34-66%) red granulation within the wound bed. There is a medium (34- 66%) amount of necrotic tissue within the wound bed including Adherent Slough. The periwound skin appearance had no abnormalities noted for moisture. The periwound skin appearance had no abnormalities noted for color. The periwound skin appearance exhibited: Callus. Periwound temperature was noted as No Abnormality. Assessment Active Problems ICD-10 Non-pressure chronic ulcer of other part of right foot with fat layer exposed Peripheral vascular disease, unspecified Atherosclerotic heart disease of  native coronary artery without angina pectoris Essential (primary) hypertension Type 2 diabetes mellitus with hyperglycemia Type 2 diabetes mellitus with diabetic neuropathy, unspecified Cerebral infarction, unspecified Procedures Wound #2 Pre-procedure diagnosis of Wound #2 is a Diabetic Wound/Ulcer of the Lower Extremity located on the Right,Plantar Foot .Severity of Tissue Pre Debridement is: Fat layer exposed. There was a Excisional Skin/Subcutaneous Tissue Debridement with a total area of 7 sq cm performed by Fredirick Maudlin, MD. With the following instrument(s): Curette to remove Non-Viable tissue/material. Material removed includes Callus, Subcutaneous Tissue, and Slough after achieving pain control using Lidocaine 5% topical ointment. No specimens were taken. A time out was conducted at 14:39, prior to the start of the procedure. A Minimum amount of bleeding was controlled with Pressure. The procedure was tolerated well. Post Debridement Measurements: 2cm length x 3.5cm width x 0.1cm depth; 0.55cm^3 volume. Character of Wound/Ulcer Post Debridement is improved. Severity of Tissue Post Debridement is: Fat layer exposed. Post procedure Diagnosis Wound #2: Same as Pre-Procedure General Notes: Scribed for Dr. Celine Ahr by Adline Peals, RN. [place], [place] A ([id number]) [id number] Page 7 of 9 Plan Follow-up Appointments: Return Appointment in 2 weeks. - Dr. Celine Ahr - room 2 Anesthetic: (In clinic) Topical Lidocaine 5% applied to wound bed Bathing/ Shower/ Hygiene: May shower and wash wound with soap and water. Edema Control - Lymphedema / SCD / Other: Elevate legs to the level of the heart or above for 30 minutes daily and/or when sitting for 3-4 times a day throughout the day. Avoid standing for long periods of time. Patient to wear own compression stockings every day. Moisturize legs daily. Off-Loading: Other: - forefront offloading shoe The following  medication(s) was prescribed: lidocaine topical 5 % ointment ointment topical was prescribed at facility WOUND #2: - Foot Wound Laterality: Plantar, Right Cleanser: Soap and Water 1 x Per Day/30 Days Discharge Instructions: May shower and wash wound with dial antibacterial soap and water prior to dressing change. Cleanser: Wound Cleanser 1 x Per Day/30 Days Discharge Instructions: Cleanse the wound with wound cleanser prior to applying a clean dressing using gauze sponges, not tissue or cotton balls. Prim Dressing: Sorbalgon AG Dressing 2x2 (in/in) 1 x Per Day/30 Days ary Discharge Instructions: Apply to wound bed as instructed Secondary Dressing: Optifoam Non-Adhesive Dressing, 4x4 in 1 x Per Day/30 Days Discharge Instructions: Apply over primary dressing as directed. Secondary  Dressing: Woven Gauze Sponge, Non-Sterile 4x4 in 1 x Per Day/30 Days Discharge Instructions: Apply over primary dressing as directed. Secured With: Elastic Bandage 4 inch (ACE bandage) 1 x Per Day/30 Days Discharge Instructions: Secure with ACE bandage as directed. Secured With: [employer], 4.5x3.1 (in/yd) 1 x Per Day/30 Days Discharge Instructions: Secure with Kerlix as directed. 09/28/2022: The wound looks much better. He has done a good job keeping it dry. There is some callus overlying much of the wound surface. There is slough on the exposed open areas. I used a curette to debride callus, slough, and subcutaneous tissue from the wound. We will continue silver alginate with a foam donut for offloading, Kerlix and Ace bandaging. He will follow-up in 2 weeks. Electronic Signature(s) Signed: 09/28/2022 5:09:17 PM By: Fredirick Maudlin MD FACS Entered By: Fredirick Maudlin on 09/28/2022 17:09:17 -------------------------------------------------------------------------------- HxROS Details Patient Name: Date of Service: Isabel Caprice, MA [place] A. 09/28/2022 2:15 PM Medical Record Number: [id number] Patient Account Number:  [id number] Date of Birth/Sex: Treating RN: Nov 01, 1955 (67 y.o. M) Primary Care Provider: Riki Sheer Other Clinician: Referring Provider: Treating Provider/Extender: Louann Sjogren in Treatment: 4 Information Obtained From Patient Respiratory Medical History: Positive for: Sleep Apnea Cardiovascular Medical History: Positive for: Arrhythmia - A-Fib; Coronary Artery Disease - s/p CABG; Deep Vein Thrombosis; Hypertension; Myocardial Infarction; Peripheral Arterial Disease - s/p Fempop x 2 Past Medical History Notes: CVA x 3 Endocrine Medical HistoryDAMERE, SOFIA A ([id number]) [id number] Page 8 of 9 Positive for: Type II Diabetes Time with diabetes: since 1997 Treated with: Insulin, Oral agents Blood sugar tested every day: Yes Tested : 2-3x per day Musculoskeletal Medical History: Past Medical History Notes: carpal tunnel syndrome Neurologic Medical History: Positive for: Neuropathy Past Medical History Notes: stroke Oncologic Medical History: Past Medical History Notes: skin cancer Immunizations Pneumococcal Vaccine: Received Pneumococcal Vaccination: Yes Received Pneumococcal Vaccination On or After 60th Birthday: Yes Implantable Devices None Hospitalization / Surgery History Type of Hospitalization/Surgery colonoscopy polypectomy peripheral vascular cath shoulder arthroscopy carpal tunnel release coronary artery bypass appendectomy cardiac cath coronary angioplasty thrombectomy knee arthroplasty popliteal artery stent Family and Social History Unknown History: Yes; Never smoker; Marital Status - Single; Alcohol Use: Never; Drug Use: No History; Caffeine Use: Rarely; Financial Concerns: No; Food, Clothing or Shelter Needs: No; Support System Lacking: No; Transportation Concerns: No Electronic Signature(s) Signed: 09/29/2022 7:40:18 AM By: Fredirick Maudlin MD FACS Entered By: Fredirick Maudlin on  09/28/2022 17:07:29 -------------------------------------------------------------------------------- SuperBill Details Patient Name: Date of Service: Isabel Caprice, [place] A. 09/28/2022 Medical Record Number: [id number] Patient Account Number: [id number] Date of Birth/Sex: Treating RN: 1955/11/09 (67 y.o. M) Primary Care Provider: Riki Sheer Other Clinician: Referring Provider: Treating Provider/Extender: Louann Sjogren in Treatment: 4 Diagnosis Coding ICD-10 Codes Code Description [phone] Non-pressure chronic ulcer of other part of right foot with fat layer exposed I73.9 Peripheral vascular disease, unspecified [place], [place] A ([id number]) [id number] Page 9 of 9 I25.10 Atherosclerotic heart disease of native coronary artery without angina pectoris I10 Essential (primary) hypertension E11.65 Type 2 diabetes mellitus with hyperglycemia E11.40 Type 2 diabetes mellitus with diabetic neuropathy, unspecified I63.9 Cerebral infarction, unspecified Facility Procedures : CPT4 Code: [id number] Description: [id number] - DEB SUBQ TISSUE 20 SQ CM/< ICD-10 Diagnosis Description L97.512 Non-pressure chronic ulcer of other part of right foot with fat layer exposed Modifier: Quantity: 1 Physician Procedures : CPT4 Code Description Modifier [id number] - WC PHYS LEVEL 3 - EST PT 25 ICD-10 Diagnosis Description L97.512 Non-pressure chronic ulcer of other  part of right foot with fat layer exposed E11.65 Type 2 diabetes mellitus with hyperglycemia I73.9  Peripheral vascular disease, unspecified E11.40 Type 2 diabetes mellitus with diabetic neuropathy, unspecified Quantity: 1 : [id number] - WC PHYS SUBQ TISS 20 SQ CM ICD-10 Diagnosis Description L97.512 Non-pressure chronic ulcer of other part of right foot with fat layer exposed Quantity: 1 Electronic Signature(s) Signed: 09/28/2022 5:09:40 PM By: Fredirick Maudlin MD FACS Entered By: Fredirick Maudlin on  09/28/2022 17:09:39

## 2022-09-29 NOTE — Progress Notes (Addendum)
[place], Kenton [name] ([id number]) [id number] Page 1 of 7 Visit Report for 09/28/2022 Arrival Information Details Patient Name: Date of Service: [name], [name]. 09/28/2022 2:15 PM Medical Record Number: [id number] Patient Account Number: [id number] Date of Birth/Sex: Treating RN: 27-Oct-1955 (67 y.o. M) Primary Care [name]: [name] Other Clinician: Referring [name]: Treating [name]/Extender: [name] in Treatment: 4 Visit Information History Since Last Visit All ordered tests and consults were completed: No Patient Arrived: Ambulatory Added or deleted any medications: No Arrival Time: 14:18 Any new allergies or adverse reactions: No Accompanied By: wife Had [name] fall or experienced change in No Transfer Assistance: None activities of daily living that may affect Patient Identification Verified: Yes risk of falls: Secondary Verification Process Completed: Yes Signs or symptoms of abuse/neglect since last visito No Hospitalized since last visit: No Implantable device outside of the clinic excluding No cellular tissue based products placed in the center since last visit: Pain Present Now: No Electronic Signature(s) Signed: 09/28/2022 2:31:58 PM By: Worthy Rancher Entered By: Worthy Rancher on 09/28/2022 14:18:51 -------------------------------------------------------------------------------- Encounter Discharge Information Details Patient Name: Date of Service: [name]. 09/28/2022 2:15 PM Medical Record Number: [id number] Patient Account Number: [id number] Date of Birth/Sex: Treating RN: 11-18-1955 (67 y.o. [name] Primary Care [name]: [name] Other Clinician: Referring Lawrencia Mauney: Treating Kaliya Shreiner/Extender: [name] in Treatment: 4 Encounter Discharge Information Items Post Procedure Vitals Discharge Condition: Stable Temperature (F): 98.6 Ambulatory Status:  Ambulatory Pulse (bpm): 53 Discharge Destination: Home Respiratory Rate (breaths/min): 20 Transportation: Private Auto Blood Pressure (mmHg): 167/78 Accompanied By: partner Schedule Follow-up Appointment: Yes Clinical Summary of Care: Patient Declined Electronic Signature(s) Signed: 09/28/2022 4:59:21 PM By: Adline Peals Entered By: Adline Peals on 09/28/2022 15:15:43 -------------------------------------------------------------------------------- Lower Extremity Assessment Details Patient Name: Date of Service: [name], [name]. 09/28/2022 2:15 PM Medical Record Number: [id number] Patient Account Number: [id number] Date of Birth/Sex: Treating RN: 08/07/1955 (67 y.o. [name] Primary Care [name]: [name] Other Clinician: Referring Guynell Kleiber: Treating Makani Seckman/Extender: [name] in Treatment: 4 Edema Assessment Assessed: [name]: No] [Right: No] P[Left: [name] [id number] [Right: [id number] Page 2 of 7] [Left: Edema] [Right: :] Calf Left: Right: Point of Measurement: From Medial Instep 43 cm Ankle Left: Right: Point of Measurement: From Medial Instep 28.1 cm Electronic Signature(s) Signed: 09/28/2022 4:59:21 PM By: Adline Peals Entered By: Adline Peals on 09/28/2022 14:36:47 -------------------------------------------------------------------------------- Multi Wound Chart Details Patient Name: Date of Service: [name], [name]. 09/28/2022 2:15 PM Medical Record Number: [id number] Patient Account Number: [id number] Date of Birth/Sex: Treating RN: Aug 22, 1955 (67 y.o. M) Primary Care [name]: [name] Other Clinician: Referring [name]: Treating [name]/Extender: [name] in Treatment: 4 Vital Signs Height(in): 70 Capillary Blood Glucose(mg/dl): 137 Weight(lbs): 260 Pulse(bpm): 48 Body Mass Index(BMI): 37.3 Blood  Pressure(mmHg): 167/78 Temperature(F): 98.6 Respiratory Rate(breaths/min): 20 [2:Photos:] [[name]:[name]] Right, Plantar Foot [name] [name] Wound Location: Gradually Appeared [name] [name] Wounding Event: Diabetic Wound/Ulcer of the Lower [name] [name] Primary Etiology: Extremity Sleep Apnea, Arrhythmia, Coronary [name] [name] Comorbid History: Artery Disease, Deep Vein Thrombosis, Hypertension, Myocardial Infarction, Peripheral Arterial Disease, Type II Diabetes, Neuropathy 07/08/2022 [name] [name] Date Acquired: 4 [name] [name] Weeks of Treatment: Open [name] [name] Wound Status: No [name] [name] Wound Recurrence: 2x3.5x0.1 [name] [name] Measurements L x W x D (cm) 5.498 [name] [name] [name] : rea 0.55 [name] [name] Volume (cm) : -115.40% [name] [name] % Reduction in [name] rea: -115.70% [name] [name] % Reduction in  Volume: Grade 1 [name] [name] Classification: Medium [name] [name] Exudate [name] mount: Serosanguineous [name] [name] Exudate Type: red, brown [name] [name] Exudate Color: Distinct, outline attached [name] [name] Wound Margin: Medium (34-66%) [name] [name] Granulation [name] mount: Red [name] [name] Granulation Quality: Medium (34-66%) [name] [name] Necrotic [name] mount: Fat Layer (Subcutaneous Tissue): Yes [name] [name] Exposed Structures: Fascia: No Tendon: No Muscle: No Joint: No Bone: No Medium (34-66%) [name] [name] Epithelialization: [name] ([id number]) [id number] Page 3 of 7 Debridement - Excisional [name] [name] Debridement: 14:39 [name] [name] Pre-procedure Verification/Time Out Taken: Lidocaine 5% topical ointment [name] [name] Pain Control: Callus, Subcutaneous, Slough [name] [name] Tissue Debrided: Skin/Subcutaneous Tissue [name] [name] Level: 7 [name] [name] Debridement [name] (sq cm): rea Curette [name] [name] Instrument: Minimum [name] [name] Bleeding: Pressure [name] [name] Hemostasis [name] chieved: Procedure was tolerated well [name] [name] Debridement Treatment Response: 2x3.5x0.1 [name] [name] Post Debridement Measurements L x W x D (cm) 0.55 [name] [name] Post Debridement Volume: (cm) Callus: Yes  [name] [name] Periwound Skin Texture: No Abnormalities Noted [name] [name] Periwound Skin Moisture: No Abnormalities Noted [name] [name] Periwound Skin Color: No Abnormality [name] [name] Temperature: Debridement [name] [name] Procedures Performed: Treatment Notes Wound #2 (Foot) Wound Laterality: Plantar, Right Cleanser Soap and Water Discharge Instruction: May shower and wash wound with dial antibacterial soap and water prior to dressing change. Wound Cleanser Discharge Instruction: Cleanse the wound with wound cleanser prior to applying [name] clean dressing using gauze sponges, not tissue or cotton balls. Peri-Wound Care Topical Primary Dressing Sorbalgon AG Dressing 2x2 (in/in) Discharge Instruction: Apply to wound bed as instructed Secondary Dressing Optifoam Non-Adhesive Dressing, 4x4 in Discharge Instruction: Apply over primary dressing as directed. Woven Gauze Sponge, Non-Sterile 4x4 in Discharge Instruction: Apply over primary dressing as directed. Secured With Elastic Bandage 4 inch (ACE bandage) Discharge Instruction: Secure with ACE bandage as directed. Kerlix Roll Sterile, 4.5x3.1 (in/yd) Discharge Instruction: Secure with Kerlix as directed. Compression Wrap Compression Stockings Add-Ons Electronic Signature(s) Signed: 09/28/2022 5:06:25 PM By: Fredirick Maudlin MD FACS Entered By: Fredirick Maudlin on 09/28/2022 17:06:25 -------------------------------------------------------------------------------- Multi-Disciplinary Care Plan Details Patient Name: Date of Service: [name], [name] [place] [name]. 09/28/2022 2:15 PM Medical Record Number: [id number] Patient Account Number: [id number] Date of Birth/Sex: Treating RN: April 13, 1956 (67 y.o. [name] Primary Care Loral Campi: [name] Other Clinician: Referring Meelah Tallo: Treating Pacey Willadsen/Extender: [name] in Treatment: 4 Active Inactive [place], [place] ([id number]) [id number] Page 4 of  7 Abuse / Safety / Falls / Self Care Management Nursing Diagnoses: Impaired physical mobility Potential for falls Goals: Patient will not experience any injury related to falls Date Initiated: 08/31/2022 Target Resolution Date: 10/23/2022 Goal Status: Active Patient/caregiver will verbalize/demonstrate measures taken to improve the patient's personal safety Date Initiated: 08/31/2022 Target Resolution Date: 10/23/2022 Goal Status: Active Interventions: Provide education on basic hygiene Provide education on fall prevention Notes: Wound/Skin Impairment Nursing Diagnoses: Impaired tissue integrity Knowledge deficit related to ulceration/compromised skin integrity Goals: Patient/caregiver will verbalize understanding of skin care regimen Date Initiated: 08/31/2022 Target Resolution Date: 10/23/2022 Goal Status: Active Interventions: Assess patient/caregiver ability to obtain necessary supplies Assess patient/caregiver ability to perform ulcer/skin care regimen upon admission and as needed Assess ulceration(s) every visit Treatment Activities: Skin care regimen initiated : 08/31/2022 Topical wound management initiated : 08/31/2022 Notes: Electronic Signature(s) Signed: 09/28/2022 4:59:21 PM By: Adline Peals Entered By: Adline Peals on 09/28/2022 14:41:06 -------------------------------------------------------------------------------- Pain Assessment Details Patient Name: Date of Service: [name], [name]. 09/28/2022 2:15 PM Medical Record Number: [id number] Patient Account Number:  [id number] Date of Birth/Sex: Treating RN: August 30, 1955 (67 y.o. M) Primary Care Zain Lankford: [name] Other Clinician: Referring Malcom Selmer: Treating Charleen Madera/Extender: [name] in Treatment: 4 Active Problems Location of Pain Severity and Description of Pain Patient Has Paino No Site Locations [place], [place] [name] ([id number]) [phone].pdf  Page 5 of 7 Pain Management and Medication Current Pain Management: Electronic Signature(s) Signed: 09/28/2022 2:31:58 PM By: Worthy Rancher Entered By: Worthy Rancher on 09/28/2022 14:19:25 -------------------------------------------------------------------------------- Patient/Caregiver Education Details Patient Name: Date of Service: [name] 3/11/2024andnbsp2:15 PM Medical Record Number: [id number] Patient Account Number: [id number] Date of Birth/Gender: Treating RN: [birth year] (67 y.o. [name] Primary Care Physician: [name] Other Clinician: Referring Physician: Treating Physician/Extender: [name] in Treatment: 4 Education Assessment Education Provided To: Patient Education Topics Provided Wound/Skin Impairment: Methods: Explain/Verbal Responses: Reinforcements needed, State content correctly Electronic Signature(s) Signed: 09/28/2022 4:59:21 PM By: Adline Peals Entered By: Adline Peals on 09/28/2022 14:41:17 -------------------------------------------------------------------------------- Wound Assessment Details Patient Name: Date of Service: [name], [name] [place] [name]. 09/28/2022 2:15 PM Medical Record Number: [id number] Patient Account Number: [id number] Date of Birth/Sex: Treating RN: 21-Nov-1955 (67 y.o. M) Primary Care Kalif Kattner: [name] Other Clinician: Referring Tashunda Vandezande: Treating Devona Holmes/Extender: [name] in Treatment: 4 Wound Status Wound Number: 2 Primary Diabetic Wound/Ulcer of the Lower Extremity Etiology: Wound Location: Right, Plantar Foot Wound Open Wounding Event: Gradually Appeared Status: Date Acquired: 07/08/2022 Comorbid Sleep Apnea, Arrhythmia, Coronary Artery Disease, Deep Vein Weeks Of Treatment: 4 History: Thrombosis, Hypertension, Myocardial Infarction, Peripheral Arterial Clustered Wound: No Disease, Type II Diabetes, Neuropathy [name], [name] ([id number]) [phone].pdf Page 6 of 7 Photos Wound Measurements Length: (cm) 2 Width: (cm) 3.5 Depth: (cm) 0.1 Area: (cm) 5.498 Volume: (cm) 0.55 % Reduction in Area: -115.4% % Reduction in Volume: -115.7% Epithelialization: Medium (34-66%) Tunneling: No Undermining: No Wound Description Classification: Grade 1 Wound Margin: Distinct, outline attached Exudate Amount: Medium Exudate Type: Serosanguineous Exudate Color: red, brown Foul Odor After Cleansing: No Slough/Fibrino Yes Wound Bed Granulation Amount: Medium (34-66%) Exposed Structure Granulation Quality: Red Fascia Exposed: No Necrotic Amount: Medium (34-66%) Fat Layer (Subcutaneous Tissue) Exposed: Yes Necrotic Quality: Adherent Slough Tendon Exposed: No Muscle Exposed: No Joint Exposed: No Bone Exposed: No Periwound Skin Texture Texture Color No Abnormalities Noted: No No Abnormalities Noted: Yes Callus: Yes Temperature / Pain Temperature: No Abnormality Moisture No Abnormalities Noted: Yes Treatment Notes Wound #2 (Foot) Wound Laterality: Plantar, Right Cleanser Soap and Water Discharge Instruction: May shower and wash wound with dial antibacterial soap and water prior to dressing change. Wound Cleanser Discharge Instruction: Cleanse the wound with wound cleanser prior to applying [name] clean dressing using gauze sponges, not tissue or cotton balls. Peri-Wound Care Topical Primary Dressing Sorbalgon AG Dressing 2x2 (in/in) Discharge Instruction: Apply to wound bed as instructed Secondary Dressing Optifoam Non-Adhesive Dressing, 4x4 in Discharge Instruction: Apply over primary dressing as directed. Woven Gauze Sponge, Non-Sterile 4x4 in Discharge Instruction: Apply over primary dressing as directed. Secured With Elastic Bandage 4 inch (ACE bandage) Discharge Instruction: Secure with ACE bandage as directed. [place], [place] [name] ([id number]) [id number] Page 7  of 7 Kerlix Roll Sterile, 4.5x3.1 (in/yd) Discharge Instruction: Secure with Kerlix as directed. Compression Wrap Compression Stockings Add-Ons Electronic Signature(s) Signed: 09/28/2022 4:59:21 PM By: Adline Peals Previous Signature: 09/28/2022 2:31:58 PM Version By: Worthy Rancher Entered By: Adline Peals on 09/28/2022 14:37:25 -------------------------------------------------------------------------------- Vitals Details Patient Name: Date of Service: [name], [place] [name]. 09/28/2022 2:15 PM Medical Record Number: [id number]  Patient Account Number: [id number] Date of Birth/Sex: Treating RN: [birth year] (67 y.o. M) Primary Care Endy Easterly: [name] Other Clinician: Referring Electa Sterry: Treating Rachyl Wuebker/Extender: [name] in Treatment: 4 Vital Signs Time Taken: 02:18 Temperature (F): 98.6 Height (in): 70 Pulse (bpm): 53 Weight (lbs): 260 Respiratory Rate (breaths/min): 20 Body Mass Index (BMI): 37.3 Blood Pressure (mmHg): 167/78 Capillary Blood Glucose (mg/dl): 137 Reference Range: 80 - 120 mg / dl Electronic Signature(s) Signed: 09/28/2022 2:31:58 PM By: Worthy Rancher Entered By: Worthy Rancher on 09/28/2022 14:19:18

## 2022-10-13 DIAGNOSIS — G4733 Obstructive sleep apnea (adult) (pediatric): Secondary | ICD-10-CM | POA: Diagnosis not present

## 2022-10-14 ENCOUNTER — Encounter (HOSPITAL_BASED_OUTPATIENT_CLINIC_OR_DEPARTMENT_OTHER): Payer: Medicare HMO | Admitting: General Surgery

## 2022-10-14 DIAGNOSIS — E1140 Type 2 diabetes mellitus with diabetic neuropathy, unspecified: Secondary | ICD-10-CM | POA: Diagnosis not present

## 2022-10-14 DIAGNOSIS — I5032 Chronic diastolic (congestive) heart failure: Secondary | ICD-10-CM | POA: Diagnosis not present

## 2022-10-14 DIAGNOSIS — Z8673 Personal history of transient ischemic attack (TIA), and cerebral infarction without residual deficits: Secondary | ICD-10-CM | POA: Diagnosis not present

## 2022-10-14 DIAGNOSIS — Z7902 Long term (current) use of antithrombotics/antiplatelets: Secondary | ICD-10-CM | POA: Diagnosis not present

## 2022-10-14 DIAGNOSIS — E1165 Type 2 diabetes mellitus with hyperglycemia: Secondary | ICD-10-CM | POA: Diagnosis not present

## 2022-10-14 DIAGNOSIS — E11621 Type 2 diabetes mellitus with foot ulcer: Secondary | ICD-10-CM | POA: Diagnosis not present

## 2022-10-14 DIAGNOSIS — I2510 Atherosclerotic heart disease of native coronary artery without angina pectoris: Secondary | ICD-10-CM | POA: Diagnosis not present

## 2022-10-14 DIAGNOSIS — I110 Hypertensive heart disease with heart failure: Secondary | ICD-10-CM | POA: Diagnosis not present

## 2022-10-14 DIAGNOSIS — L97512 Non-pressure chronic ulcer of other part of right foot with fat layer exposed: Secondary | ICD-10-CM | POA: Diagnosis not present

## 2022-10-14 DIAGNOSIS — I4891 Unspecified atrial fibrillation: Secondary | ICD-10-CM | POA: Diagnosis not present

## 2022-10-14 DIAGNOSIS — E1151 Type 2 diabetes mellitus with diabetic peripheral angiopathy without gangrene: Secondary | ICD-10-CM | POA: Diagnosis not present

## 2022-10-14 DIAGNOSIS — Z85828 Personal history of other malignant neoplasm of skin: Secondary | ICD-10-CM | POA: Diagnosis not present

## 2022-10-14 DIAGNOSIS — I872 Venous insufficiency (chronic) (peripheral): Secondary | ICD-10-CM | POA: Diagnosis not present

## 2022-10-14 DIAGNOSIS — G4733 Obstructive sleep apnea (adult) (pediatric): Secondary | ICD-10-CM | POA: Diagnosis not present

## 2022-10-14 DIAGNOSIS — Z7901 Long term (current) use of anticoagulants: Secondary | ICD-10-CM | POA: Diagnosis not present

## 2022-10-15 NOTE — Progress Notes (Signed)
[place], [place] [name] ([id number]) [id number] Page 1 of 7 Visit Report for 10/14/2022 Arrival Information Details Patient Name: Date of Service: [name] [name], [name] [name] [name]. 10/14/2022 10:45 [name] Medical Record Number: [id number] Patient Account Number: [id number] Date of Birth/Sex: Treating RN: 11/10/1955 (67 y.o. [name] [name] Primary Care [name] [name]: [name] [name] Other Clinician: Referring [name] [name]: Treating [name] [name]/Extender: [name] [name] in Treatment: 6 Visit Information History Since Last Visit Added or deleted any medications: No Patient Arrived: Ambulatory Any new allergies or adverse reactions: No Arrival Time: 10:12 Had [name] fall or experienced change in No Accompanied By: partner activities of daily living that may affect Transfer Assistance: None risk of falls: Patient Identification Verified: Yes Signs or symptoms of abuse/neglect since last visito No Secondary Verification Process Completed: Yes Hospitalized since last visit: No Implantable device outside of the clinic excluding No cellular tissue based products placed in the center since last visit: Has Dressing in Place as Prescribed: Yes Pain Present Now: No Electronic Signature(s) Signed: 10/14/2022 4:53:25 PM By: [name] [name] Entered By: [name] [name] 10:12:54 -------------------------------------------------------------------------------- Encounter Discharge Information Details Patient Name: Date of Service: [name] [name], [name] [name]. 10/14/2022 10:45 [name] Medical Record Number: [id number] Patient Account Number: [id number] Date of Birth/Sex: Treating RN: 07-08-1956 (67 y.o. [name] [name] Primary Care [name] [name]: [name] [name] Other Clinician: Referring [name] [name]: Treating [name] [name]/Extender: [name] [name] in Treatment: 6 Encounter Discharge Information Items Post Procedure Vitals Discharge Condition:  Stable Temperature (F): 97.6 Ambulatory Status: Ambulatory Pulse (bpm): 58 Discharge Destination: Home Respiratory Rate (breaths/min): 20 Transportation: Private Auto Blood Pressure (mmHg): 180/67 Accompanied By: partner Schedule Follow-up Appointment: Yes Clinical Summary of Care: Patient Declined Electronic Signature(s) Signed: 10/14/2022 4:53:25 PM By: [name] [name] Entered By: [name] [name] 10:44:27 -------------------------------------------------------------------------------- Lower Extremity Assessment Details Patient Name: Date of Service: [name] [name], [name] [name]. 10/14/2022 10:45 [name] Medical Record Number: [id number] Patient Account Number: [id number] Date of Birth/Sex: Treating RN: November 13, 1955 (67 y.o. [name] [name] Primary Care [name] [name]: [name] [name] Other Clinician: Referring Mckynleigh Mussell: Treating [name] [name]/Extender: [name] [name] in Treatment: 6 Edema Assessment Assessed: [Left: No] [Right: No] P[Left: EGG, Abel [name] [id number] [[id number] Page 2 of 7] [Left: Edema] [Right: :] Calf Left: Right: Point of Measurement: From Medial Instep 43 cm Ankle Left: Right: Point of Measurement: From Medial Instep 28.1 cm Electronic Signature(s) Signed: 10/14/2022 4:53:25 PM By: [name] [name] Entered By: [name] [name] 10:19:12 -------------------------------------------------------------------------------- Multi Wound Chart Details Patient Name: Date of Service: [name] Cap[place], [name] [place] [name]. 10/14/2022 10:45 [name] Medical Record Number: [id number] Patient Account Number: [id number] Date of Birth/Sex: Treating RN: 1956/06/17 (67 y.o. [name]) Primary Care [name] [name]: [name] [name] Other Clinician: Referring [name] [name]: Treating [name] [name]/Extender: [name] [name] in Treatment: 6 Vital Signs Height(in): 70 Pulse(bpm): 4 Weight(lbs): 260 Blood Pressure(mmHg):  180/67 Body Mass Index(BMI): 37.3 Temperature(F): 97.6 Respiratory Rate(breaths/min): 20 [2:Photos:] [N/[name]:N/[name]] Right, Plantar Foot N/[name] N/[name] Wound Location: Gradually Appeared N/[name] N/[name] Wounding Event: Diabetic Wound/Ulcer of the Lower N/[name] N/[name] Primary Etiology: Extremity Sleep Apnea, Arrhythmia, Coronary N/[name] N/[name] Comorbid History: Artery Disease, Deep Vein Thrombosis, Hypertension, Myocardial Infarction, Peripheral Arterial Disease, Type II Diabetes, Neuropathy 07/08/2022 N/[name] N/[name] Date Acquired: 6 N/[name] N/[name] Weeks of Treatment: Open N/[name] N/[name] Wound Status: No N/[name] N/[name] Wound Recurrence: 1.2x1.4x0.1 N/[name] N/[name] Measurements L x W x D (cm) 1.319 N/[name] N/[name] : rea 0.132 N/[name] N/[name] Volume (cm) : 48.30% N/[name] N/[name] % Reduction in [name] rea: 48.20% N/[name] N/[name] % Reduction  in Volume: Grade 1 N/[name] N/[name] Classification: Medium N/[name] N/[name] Exudate [name] mount: Serosanguineous N/[name] N/[name] Exudate Type: red, brown N/[name] N/[name] Exudate Color: Distinct, outline attached N/[name] N/[name] Wound Margin: Large (67-100%) N/[name] N/[name] Granulation [name] mount: Red N/[name] N/[name] Granulation Quality: Small (1-33%) N/[name] N/[name] Necrotic [name] mount: Fat Layer (Subcutaneous Tissue): Yes N/[name] N/[name] Exposed Structures: Fascia: No Tendon: No Muscle: No Joint: No Bone: No Medium (34-66%) N/[name] N/[name] Epithelialization: Romanoski, [name] [name] ([id number] Page 3 of 7 Debridement - Excisional N/[name] N/[name] Debridement: 10:23 N/[name] N/[name] Pre-procedure Verification/Time Out Taken: Lidocaine 4% Topical Solution N/[name] N/[name] Pain Control: Callus, Subcutaneous, Slough N/[name] N/[name] Tissue Debrided: Skin/Subcutaneous Tissue N/[name] N/[name] Level: 1.68 N/[name] N/[name] Debridement [name] (sq cm): rea Curette N/[name] N/[name] Instrument: Minimum N/[name] N/[name] Bleeding: Pressure N/[name] N/[name] Hemostasis [name] chieved: Procedure was tolerated well N/[name] N/[name] Debridement Treatment Response: 1.2x1.4x0.1 N/[name] N/[name] Post Debridement Measurements L x W x D (cm) 0.132 N/[name] N/[name] Post Debridement Volume:  (cm) Callus: Yes N/[name] N/[name] Periwound Skin Texture: No Abnormalities Noted N/[name] N/[name] Periwound Skin Moisture: No Abnormalities Noted N/[name] N/[name] Periwound Skin Color: No Abnormality N/[name] N/[name] Temperature: Debridement N/[name] N/[name] Procedures Performed: Treatment Notes Wound #2 (Foot) Wound Laterality: Plantar, Right Cleanser Soap and Water Discharge Instruction: May shower and wash wound with dial antibacterial soap and water prior to dressing change. Wound Cleanser Discharge Instruction: Cleanse the wound with wound cleanser prior to applying [name] clean dressing using gauze sponges, not tissue or cotton balls. Peri-Wound Care Topical Primary Dressing Hydrofera Blue Ready Transfer Foam, 2.5x2.5 (in/in) Discharge Instruction: Apply directly to wound bed as directed Secondary Dressing Optifoam Non-Adhesive Dressing, 4x4 in Discharge Instruction: Apply over primary dressing as directed. Woven Gauze Sponge, Non-Sterile 4x4 in Discharge Instruction: Apply over primary dressing as directed. Secured With Elastic Bandage 4 inch (ACE bandage) Discharge Instruction: Secure with ACE bandage as directed. Kerlix Roll Sterile, 4.5x3.1 (in/yd) Discharge Instruction: Secure with Kerlix as directed. Compression Wrap Compression Stockings Add-Ons Electronic Signature(s) Signed: 10/14/2022 10:49:54 AM By: Fredirick Maudlin MD FACS Entered By: Fredirick Maudlin on [name] 10:49:54 -------------------------------------------------------------------------------- Multi-Disciplinary Care Plan Details Patient Name: Date of Service: [name] [name], [name] [name] [name]. 10/14/2022 10:45 [name] Medical Record Number: [id number] Patient Account Number: [id number] Date of Birth/Sex: Treating RN: [birth year] (67 y.o. [name] [name] Primary Care Yvonne Stopher: [name] [name] Other Clinician: Referring Monta Maiorana: Treating Yohanna Tow/Extender: [name] [name] in Treatment: 6 Active Inactive [place], [place] ([id number])  [id number] Page 4 of 7 Abuse / Safety / Falls / Self Care Management Nursing Diagnoses: Impaired physical mobility Potential for falls Goals: Patient will not experience any injury related to falls Date Initiated: 08/31/2022 [place] Resolution Date: 10/23/2022 Goal Status: Active Patient/caregiver will verbalize/demonstrate measures taken to improve the patient's personal safety Date Initiated: 08/31/2022 Target Resolution Date: 10/23/2022 Goal Status: Active Interventions: Provide education on basic hygiene Provide education on fall prevention Notes: Wound/Skin Impairment Nursing Diagnoses: Impaired tissue integrity Knowledge deficit related to ulceration/compromised skin integrity Goals: Patient/caregiver will verbalize understanding of skin care regimen Date Initiated: 08/31/2022 Target Resolution Date: 10/23/2022 Goal Status: Active Interventions: Assess patient/caregiver ability to obtain necessary supplies Assess patient/caregiver ability to perform ulcer/skin care regimen upon admission and as needed Assess ulceration(s) every visit Treatment Activities: Skin care regimen initiated : 08/31/2022 Topical wound management initiated : 08/31/2022 Notes: Electronic Signature(s) Signed: 10/14/2022 4:53:25 PM By: [name] [name] Entered By: [name] [name] 10:26:19 -------------------------------------------------------------------------------- Pain Assessment Details Patient Name: Date of Service: [name] [name], [place] [name] [name]. 10/14/2022 10:45 [name] Medical  Record Number: [id number] Patient Account Number: [id number] Date of Birth/Sex: Treating RN: 17-Feb-1956 (67 y.o. [name] [name] Primary Care Aliyah Abeyta: [name] [name] Other Clinician: Referring Ivis Henneman: Treating Jamee Keach/Extender: [name] [name] in Treatment: 6 Active Problems Location of Pain Severity and Description of Pain Patient Has Paino No Site  Locations Rate the pain. [place], [place] [name] ([id number]) [id number] Page 5 of 7 Rate the pain. Current Pain Level: 0 Pain Management and Medication Current Pain Management: Electronic Signature(s) Signed: 10/14/2022 4:53:25 PM By: [name] [name] Entered By: [name] [name] 10:13:00 -------------------------------------------------------------------------------- Patient/Caregiver Education Details Patient Name: Date of Service: [name] [name] Medical Record Number: [id number] Patient Account Number: [id number] Date of Birth/Gender: Treating RN: November 11, 1955 (67 y.o. [name] [name] Primary Care Physician: [name] [name] Other Clinician: Referring Physician: Treating Physician/Extender: [name] [name] in Treatment: 6 Education Assessment Education Provided To: Patient Education Topics Provided Wound/Skin Impairment: Methods: Explain/Verbal Responses: Reinforcements needed, State content correctly Electronic Signature(s) Signed: 10/14/2022 4:53:25 PM By: [name] [name] Entered By: [name] [name] 10:26:29 -------------------------------------------------------------------------------- Wound Assessment Details Patient Name: Date of Service: [name] [name], [name] [place] [name]. 10/14/2022 10:45 [name] Medical Record Number: [id number] Patient Account Number: [id number] Date of Birth/Sex: Treating RN: [birth year] (67 y.o. [name] [name] Primary Care Jashanti Clinkscale: [name] [name] Other Clinician: Referring Jasia Hiltunen: Treating Nuriyah Hanline/Extender: [name] [name] in Treatment: 6 Wound Status Wound Number: 2 Primary Diabetic Wound/Ulcer of the Lower Extremity Etiology: Wound Location: Right, Plantar Foot Wound Open Wounding Event: Gradually Appeared Status: Date Acquired: 07/08/2022 Comorbid Sleep Apnea, Arrhythmia, Coronary Artery Disease, Deep Vein Weeks  Of Treatment: 6 History: Thrombosis, Hypertension, Myocardial Infarction, Peripheral Arterial Clustered Wound: No Disease, Type II Diabetes, Neuropathy [name] [name], [name] [name] ([id number]) [phone].pdf Page 6 of 7 Photos Wound Measurements Length: (cm) 1.2 Width: (cm) 1.4 Depth: (cm) 0.1 Area: (cm) 1.319 Volume: (cm) 0.132 % Reduction in Area: 48.3% % Reduction in Volume: 48.2% Epithelialization: Medium (34-66%) Tunneling: No Undermining: No Wound Description Classification: Grade 1 Wound Margin: Distinct, outline attached Exudate Amount: Medium Exudate Type: Serosanguineous Exudate Color: red, brown Foul Odor After Cleansing: No Slough/Fibrino Yes Wound Bed Granulation Amount: Large (67-100%) Exposed Structure Granulation Quality: Red Fascia Exposed: No Necrotic Amount: Small (1-33%) Fat Layer (Subcutaneous Tissue) Exposed: Yes Necrotic Quality: Adherent Slough Tendon Exposed: No Muscle Exposed: No Joint Exposed: No Bone Exposed: No Periwound Skin Texture Texture Color No Abnormalities Noted: No No Abnormalities Noted: Yes Callus: Yes Temperature / Pain Temperature: No Abnormality Moisture No Abnormalities Noted: Yes Treatment Notes Wound #2 (Foot) Wound Laterality: Plantar, Right Cleanser Soap and Water Discharge Instruction: May shower and wash wound with dial antibacterial soap and water prior to dressing change. Wound Cleanser Discharge Instruction: Cleanse the wound with wound cleanser prior to applying [name] clean dressing using gauze sponges, not tissue or cotton balls. Peri-Wound Care Topical Primary Dressing Hydrofera Blue Ready Transfer Foam, 2.5x2.5 (in/in) Discharge Instruction: Apply directly to wound bed as directed Secondary Dressing Optifoam Non-Adhesive Dressing, 4x4 in Discharge Instruction: Apply over primary dressing as directed. Woven Gauze Sponge, Non-Sterile 4x4 in Discharge Instruction: Apply over primary dressing as  directed. Secured With Elastic Bandage 4 inch (ACE bandage) Discharge Instruction: Secure with ACE bandage as directed. [name] [name], [name] [name] ([id number]) [id number] Page 7 of 7 Kerlix Roll Sterile, 4.5x3.1 (in/yd) Discharge Instruction: Secure with Kerlix as directed. Compression Wrap Compression Stockings Add-Ons Electronic Signature(s) Signed: 10/14/2022 4:53:25 PM By: [name] [name] Entered By: [name] [name] 10:20:30 -------------------------------------------------------------------------------- Vitals Details  Patient Name: Date of Service: [name] [name], [name] [name] [place] [name]. 10/14/2022 10:45 [name] Medical Record Number: [id number] Patient Account Number: [id number] Date of Birth/Sex: Treating RN: 06-10-1956 (67 y.o. [name] [name] Primary Care Mariaguadalupe Fialkowski: [name] [name] Other Clinician: Referring Yolander Goodie: Treating Cyrus [place]/Extender: [name] [name] in Treatment: 6 Vital Signs Time Taken: 10:15 Temperature (F): 97.6 Height (in): 70 Pulse (bpm): 58 Weight (lbs): 260 Respiratory Rate (breaths/min): 20 Body Mass Index (BMI): 37.3 Blood Pressure (mmHg): 180/67 Reference Range: 80 - 120 mg / dl Electronic Signature(s) Signed: 10/14/2022 4:53:25 PM By: [name] [name] Entered By: [name] [name] 10:19:08

## 2022-10-15 NOTE — Progress Notes (Signed)
[place], [place] ([id number]) [id number] Page 1 of 9 Visit Report for 10/14/2022 Chief Complaint Document Details Patient Name: Date of Service: [name], [place] [name]. 10/14/2022 10:45 [name] Medical Record Number: [id number] Patient Account Number: [id number] Date of Birth/Sex: Treating RN: 11-Nov-1955 (67 y.o. M) Primary Care Provider: Riki [name] Other Clinician: Referring Provider: Treating Provider/Extender: [name] in Treatment: 6 Information Obtained from: Patient Chief Complaint 05/14/2020; patient is here for review of an abrasion injury on the left lateral calf 08/31/2022: [name] (1st metatarsal base, plantar) Electronic Signature(s) Signed: 10/14/2022 10:50:08 AM By: [name] Maudlin MD FACS Entered By: [name] on 10/14/2022 10:50:07 -------------------------------------------------------------------------------- Debridement Details Patient Name: Date of Service: [name], [place] [name]. 10/14/2022 10:45 [name] Medical Record Number: [id number] Patient Account Number: [id number] Date of Birth/Sex: Treating RN: 1956-04-16 (67 y.o. [name] Primary Care Provider: Riki [name] Other Clinician: Referring Provider: Treating Provider/Extender: [name] in Treatment: 6 Debridement Performed for Assessment: Wound #2 Right,Plantar Foot Performed By: Physician [name] Maudlin, MD Debridement Type: Debridement Severity of Tissue Pre Debridement: Fat layer exposed Level of Consciousness (Pre-procedure): Awake and Alert Pre-procedure Verification/Time Out Yes - 10:23 Taken: Start Time: 10:23 Pain Control: Lidocaine 4% T opical Solution T Area Debrided (L x W): otal 1.2 (cm) x 1.4 (cm) = 1.68 (cm) Tissue and other material debrided: Non-Viable, Callus, Slough, Subcutaneous, Skin: Epidermis, Slough Level: Skin/Subcutaneous Tissue Debridement Description:  Excisional Instrument: Curette Bleeding: Minimum Hemostasis Achieved: Pressure Response to Treatment: Procedure was tolerated well Level of Consciousness (Post- Awake and Alert procedure): Post Debridement Measurements of Total Wound Length: (cm) 1.2 Width: (cm) 1.4 Depth: (cm) 0.1 Volume: (cm) 0.132 Character of Wound/Ulcer Post Debridement: Improved Severity of Tissue Post Debridement: Fat layer exposed Post Procedure Diagnosis Same as Pre-procedure Notes scribed for Dr. Celine [name] by [name] Peals, RN Electronic Signature(s) Signed: 10/14/2022 10:54:12 AM By: [name] Maudlin MD FACS Signed: 10/14/2022 4:53:25 PM By: [name], [place] ([id number]) [phone].pdf Page 2 of 9 Signed: 10/14/2022 4:53:25 PM By: [name] By: [name] on 10/14/2022 10:29:21 -------------------------------------------------------------------------------- HPI Details Patient Name: Date of Service: [name], [name] [place] [name]. 10/14/2022 10:45 [name] Medical Record Number: [id number] Patient Account Number: [id number] Date of Birth/Sex: Treating RN: [birth year] (67 y.o. M) Primary Care Provider: Riki [name] Other Clinician: Referring Provider: Treating Provider/Extender: [name] in Treatment: 6 History of Present Illness HPI Description: ADMISSION 05/14/2020; this is [name] with multiple medical issues. Predominantly he has type 2 diabetes with [name] and also history of fairly significant PAD. He had [name] in February 2017 he also had an atherectomy and angioplasty by Dr. Gwenlyn [name] of the right popliteal artery in 2016. He is supposed to be getting arterial studies annually however this was interrupted last year because of the pandemic. He tells [place] he was at [employer] 2 weeks ago was getting out of of the scooter and  traumatized his left lateral lower leg. There was [name] as the patient is on Plavix and Eliquis. They have been dressing this with Neosporin and doing [name] fairly good job. Wound measures 2.5 x 3.5 it does not have any depth he does not have [name] in his legs outside of surgery however he does have chronic edema and skin changes suggestive of chronic venous disease possibly some degree of lymphedema as well. Past medical history includes type  2 diabetes with peripheral neuropathy and gait instability, lumbar spondylosis, obstructive sleep apnea, history of [name], basal cell skin cancer, atrial fibrillation on anticoagulation, significant PAD as noted with [name] and [name] by Dr. Gwenlyn [name] in 30th 2016. He also has [name] of coronary artery disease with an MI in 2002 hypertension hyperlipidemia and heart failure with preserved ejection fraction His last arterial studies I can see in epic were on 03/10/2018 this showed [name] and [name] with monophasic waveforms on the right. On the left his ABI was 1.20 with [name] and triphasic waveforms. He has not had arterial studies since. Our nurse in the clinic got an ABI on the left of 1.1 11/2; left anterior leg wound in the setting of chronic venous insufficiency. Wound was initially trauma. We have been using Hydrofera Blue under compression he has home health.. The wound looks [name] lot better today with improvement in surface area 11/16; left anterior leg wound in the setting of chronic venous insufficiency. Wound was initially trauma. We have been using Hydrofera Blue under compression. The patient is closed today. He is supposed to follow-up with vein and vascular with regards to arterial insufficiency nevertheless his leg wounds are 12/3; apparently 2 weeks ago when they were putting on their  stockings they managed to get 3 wounds on the left anterior lower leg from abrasion when putting on the stockings. Home health came by the week of Thanksgiving and put Hydrofera Blue 4-layer wrap on this and there is only one superficial area remaining. The patient and his wife complained about the difficulties getting stockings on I think we are using 20/30. We will order bilateral external compression stockings which should be easier. 12/10; wound on the left anterior lower leg is closed. He has chronic venous insufficiency we ordered him Farrow wrap stockings unfortunately he did not bring these in. 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 He says that it has been present since mid December. He is currently residing in [facility] until March and [name] podiatrist has been looking after him there. They have been simply painting the area with Betadine. He has not had any lower extremity arterial studies since 2019, at which time his right ABI was 0.69. Measured in clinic today, it was 0.71. He is not aware of his most recent hemoglobin A1c, but historically he has had exceptionally poor control. On the basis of his right first metatarsal, there is [name] small crescent shaped wound. There is surrounding eschar and callus. There is no malodor or purulent drainage. 09/08/2022: The original wound is smaller today and fairly clean, but there is some discoloration and [name] pulpy texture to the adjacent callus. Underneath this, the tissue is open exposing the fat layer. It looks like perhaps there was [name] crack in the callus and moisture got under the skin and caused breakdown. 09/15/2022: There has been more moisture related tissue breakdown. The callus is very soft and the underlying tissue is more open. 09/28/2022: The wound looks much better. He has done [name] good job keeping it dry. There is some callus overlying much of the wound surface. There is  slough on the exposed open areas. 10/14/2022: There has been [name] lot of moisture related tissue breakdown. He is still forming callus over the  top and then it seems that moisture gets underneath the callus and causes tissue damage. There is slough on the exposed wound surface. They will be moving back to the [place] area [place] this weekend. Electronic Signature(s) Signed: 10/14/2022 10:51:10 AM By: [name] Maudlin MD FACS Entered By: [name] on 10/14/2022 10:51:10 -------------------------------------------------------------------------------- Physical Exam Details Patient Name: Date of Service: [name], [name]. 10/14/2022 10:45 [name] Medical Record Number: [id number] Patient Account Number: [id number] Date of Birth/Sex: Treating RN: 1956/05/01 (67 y.o. M) Primary Care Provider: Riki [name] Other Clinician: Vanetta Shawl [name] ([id number]) [id number] Page 3 of 9 Referring Provider: Treating Provider/Extender: [name] in Treatment: 6 Constitutional Hypertensive, asymptomatic. Slightly bradycardic. . . no acute distress. Respiratory Normal work of breathing on room air. Notes 10/14/2022: There has been [name] lot of moisture related tissue breakdown. He is still forming callus over the top and then it seems that moisture gets underneath the callus and causes tissue damage. There is slough on the exposed wound surface. Electronic Signature(s) Signed: 10/14/2022 10:51:49 AM By: [name] Maudlin MD FACS Entered By: [name] on 10/14/2022 10:51:49 -------------------------------------------------------------------------------- Physician Orders Details Patient Name: Date of Service: [name], [place] [name]. 10/14/2022 10:45 [name] Medical Record Number: [id number] Patient Account Number: [id number] Date of Birth/Sex: Treating RN: 12/17/1955 (67 y.o. [name] Primary Care Provider: Riki [name] Other  Clinician: Referring Provider: Treating Provider/Extender: [name] in Treatment: 6 Verbal / Phone Orders: No Diagnosis Coding ICD-10 Coding Code Description [phone] Non-pressure chronic ulcer of other part of right foot with fat layer exposed I73.9 Peripheral vascular disease, unspecified I25.10 Atherosclerotic heart disease of native coronary artery without angina pectoris I10 Essential (primary) hypertension E11.65 Type 2 diabetes mellitus with hyperglycemia E11.40 Type 2 diabetes mellitus with diabetic neuropathy, unspecified I63.9 Cerebral infarction, unspecified Follow-up Appointments ppointment in 1 week. - Dr. Celine [name] - room 2 Return [name] TCC next week Anesthetic (In clinic) Topical Lidocaine 4% applied to wound bed Bathing/ Shower/ Hygiene May shower and wash wound with soap and water. Edema Control - Lymphedema / SCD / Other Elevate legs to the level of the heart or above for 30 minutes daily and/or when sitting for 3-4 times [name] day throughout the day. Avoid standing for long periods of time. Patient to wear own compression stockings every day. Moisturize legs daily. Off-Loading Other: - forefront offloading shoe Wound Treatment Wound #2 - Foot Wound Laterality: Plantar, Right Cleanser: Soap and Water 1 x Per Day/30 Days Discharge Instructions: May shower and wash wound with dial antibacterial soap and water prior to dressing change. Cleanser: Wound Cleanser 1 x Per Day/30 Days Discharge Instructions: Cleanse the wound with wound cleanser prior to applying [name] clean dressing using gauze sponges, not tissue or cotton balls. Prim Dressing: Hydrofera Blue Ready Transfer Foam, 2.5x2.5 (in/in) 1 x Per Day/30 Days ary Discharge Instructions: Apply directly to wound bed as directed Secondary Dressing: Optifoam Non-Adhesive Dressing, 4x4 in 1 x Per Day/30 Days [place], [place] [name] ([id number]) [phone].pdf Page 4 of 9 Discharge  Instructions: Apply over primary dressing as directed. Secondary Dressing: Woven Gauze Sponge, Non-Sterile 4x4 in 1 x Per Day/30 Days Discharge Instructions: Apply over primary dressing as directed. Secured With: Elastic Bandage 4 inch (ACE bandage) 1 x Per Day/30 Days Discharge Instructions: Secure with ACE bandage as directed. Secured With: [employer], 4.5x3.1 (in/yd) 1 x Per Day/30 Days Discharge Instructions: Secure with Kerlix as directed. Patient Medications llergies: No Known Drug Allergies  [name] Notifications Medication Indication Start End 10/14/2022 lidocaine DOSE topical 4 % cream - cream topical Electronic Signature(s) Signed: 10/14/2022 10:54:12 AM By: [name] Maudlin MD FACS Entered By: [name] on 10/14/2022 10:51:59 -------------------------------------------------------------------------------- Problem List Details Patient Name: Date of Service: [name], [place] [name]. 10/14/2022 10:45 [name] Medical Record Number: [id number] Patient Account Number: [id number] Date of Birth/Sex: Treating RN: 03-06-1956 (67 y.o. M) Primary Care Provider: Riki [name] Other Clinician: Referring Provider: Treating Provider/Extender: [name] in Treatment: 6 Active Problems ICD-10 Encounter Code Description Active Date MDM Diagnosis L97.512 Non-pressure chronic ulcer of other part of right foot with fat layer exposed 08/31/2022 No Yes I73.9 Peripheral vascular disease, unspecified 08/31/2022 No Yes I25.10 Atherosclerotic heart disease of native coronary artery without angina pectoris 08/31/2022 No Yes I10 Essential (primary) hypertension 08/31/2022 No Yes E11.65 Type 2 diabetes mellitus with hyperglycemia 08/31/2022 No Yes E11.40 Type 2 diabetes mellitus with diabetic neuropathy, unspecified 08/31/2022 No Yes I63.9 Cerebral infarction, unspecified 08/31/2022 No Yes Inactive Problems Resolved Problems [place], [name] ([id number])  [phone].pdf Page 5 of 9 Electronic Signature(s) Signed: 10/14/2022 10:49:48 AM By: [name] Maudlin MD FACS Entered By: [name] on 10/14/2022 10:49:47 -------------------------------------------------------------------------------- Progress Note Details Patient Name: Date of Service: [name], [place] [name]. 10/14/2022 10:45 [name] Medical Record Number: [id number] Patient Account Number: [id number] Date of Birth/Sex: Treating RN: 1956/01/29 (67 y.o. M) Primary Care Provider: Riki [name] Other Clinician: Referring Provider: Treating Provider/Extender: [name] in Treatment: 6 Subjective Chief Complaint Information obtained from Patient 05/14/2020; patient is here for review of an abrasion injury on the left lateral calf 08/31/2022: [name] (1st metatarsal base, plantar) History of Present Illness (HPI) ADMISSION 05/14/2020; this is [name] with multiple medical issues. Predominantly he has type 2 diabetes with [name] and also history of fairly significant PAD. He had [name] in February 2017 he also had an atherectomy and angioplasty by Dr. Gwenlyn [name] of the right popliteal artery in 2016. He is supposed to be getting arterial studies annually however this was interrupted last year because of the pandemic. He tells [place] he was at [facility] 2 weeks ago was getting out of of the scooter and traumatized his left lateral lower leg. There was [name] as the patient is on Plavix and Eliquis. They have been dressing this with Neosporin and doing [name] fairly good job. Wound measures 2.5 x 3.5 it does not have any depth he does not have [name] in his legs outside of surgery however he does have chronic edema and skin changes suggestive of chronic venous disease possibly some degree of lymphedema as well. Past medical history includes  type 2 diabetes with peripheral neuropathy and gait instability, lumbar spondylosis, obstructive sleep apnea, history of [name], basal cell skin cancer, atrial fibrillation on anticoagulation, significant PAD as noted with [name] and [name] by Dr. Gwenlyn [name] in 30th 2016. He also has [name] of coronary artery disease with an MI in 2002 hypertension hyperlipidemia and heart failure with preserved ejection fraction His last arterial studies I can see in epic were on 03/10/2018 this showed [name] and [name] with monophasic waveforms on the right. On the left his ABI was 1.20 with [name] and triphasic waveforms. He  has not had arterial studies since. Our nurse in the clinic got an ABI on the left of 1.1 11/2; left anterior leg wound in the setting of chronic venous insufficiency. Wound was initially trauma. We have been using Hydrofera Blue under compression he has home health.. The wound looks [name] lot better today with improvement in surface area 11/16; left anterior leg wound in the setting of chronic venous insufficiency. Wound was initially trauma. We have been using Hydrofera Blue under compression. The patient is closed today. He is supposed to follow-up with vein and vascular with regards to arterial insufficiency nevertheless his leg wounds are 12/3; apparently 2 weeks ago when they were putting on their stockings they managed to get 3 wounds on the left anterior lower leg from abrasion when putting on the stockings. Home health came by the week of Thanksgiving and put Hydrofera Blue 4-layer wrap on this and there is only one superficial area remaining. The patient and his wife complained about the difficulties getting stockings on I think we are using 20/30. We will order bilateral external compression stockings which should be easier. 12/10; wound on the  left anterior lower leg is closed. He has chronic venous insufficiency we ordered him Farrow wrap stockings unfortunately he did not bring these in. 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 He says that it has been present since mid December. He is currently residing in [facility] until March and [name] podiatrist has been looking after him there. They have been simply painting the area with Betadine. He has not had any lower extremity arterial studies since 2019, at which time his right ABI was 0.69. Measured in clinic today, it was 0.71. He is not aware of his most recent hemoglobin A1c, but historically he has had exceptionally poor control. On the basis of his right first metatarsal, there is [name] small crescent shaped wound. There is surrounding eschar and callus. There is no malodor or purulent drainage. 09/08/2022: The original wound is smaller today and fairly clean, but there is some discoloration and [name] pulpy texture to the adjacent callus. Underneath this, the tissue is open exposing the fat layer. It looks like perhaps there was [name] crack in the callus and moisture got under the skin and caused breakdown. 09/15/2022: There has been more moisture related tissue breakdown. The callus is very soft and the underlying tissue is more open. 09/28/2022: The wound looks much better. He has done [name] good job keeping it dry. There is some callus overlying much of the wound surface. There is slough on the exposed open areas. 10/14/2022: There has been [name] lot of moisture related tissue breakdown. He is still forming callus over the top and then it seems that moisture gets underneath the callus and causes tissue damage. There is slough on the exposed wound surface. They will be moving back [place] the [place] this [place]. Patient History Information obtained from Patient. Family History Unknown History. Social History LEEANDREW, KINDSCHI [name]  ([id number]) [id number] Page 6 of 9 Never smoker, Marital Status - Single, Alcohol Use - Never, Drug Use - No History, Caffeine Use - Rarely. Medical History Respiratory Patient has history of Sleep Apnea Cardiovascular Patient has history of Arrhythmia - [name]-Fib, Coronary Artery Disease - s/p CABG, Deep Vein Thrombosis, Hypertension, Myocardial Infarction, Peripheral Arterial Disease - s/p Fempop x 2 Endocrine Patient has history of Type II Diabetes Neurologic Patient has history  of Neuropathy Hospitalization/Surgery History - colonoscopy. - polypectomy. - peripheral vascular cath. - shoulder arthroscopy. - carpal tunnel release. - coronary artery bypass. - appendectomy. - cardiac cath. - coronary angioplasty. - thrombectomy. - knee arthroplasty. - popliteal artery stent. 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 Slightly bradycardic. no acute distress. Vitals Time Taken: 10:15 AM, Height: 70 in, Weight: 260 lbs, BMI: 37.3, Temperature: 97.6 F, Pulse: 58 bpm, Respiratory Rate: 20 breaths/min, Blood Pressure: 180/67 mmHg. Respiratory Normal work of breathing on room air. General Notes: 10/14/2022: There has been [name] lot of moisture related tissue breakdown. He is still forming callus over the top and then it seems that moisture gets underneath the callus and causes tissue damage. There is slough on the exposed wound surface. Integumentary (Hair, Skin) Wound #2 status is Open. Original cause of wound was Gradually Appeared. The date acquired was: 07/08/2022. The wound has been in treatment 6 weeks. The wound is located on the [place]. The wound measures 1.2cm length x 1.4cm width x 0.1cm depth; 1.319cm^2 area and 0.132cm^3 volume. There is Fat Layer (Subcutaneous Tissue) exposed. There is no  tunneling or undermining noted. There is [name] medium amount of serosanguineous drainage noted. The wound margin is distinct with the outline attached to the wound base. There is large (67-100%) red granulation within the wound bed. There is [name] small (1-33%) amount of necrotic tissue within the wound bed including Adherent Slough. The periwound skin appearance had no abnormalities noted for moisture. The periwound skin appearance had no abnormalities noted for color. The periwound skin appearance exhibited: Callus. Periwound temperature was noted as No Abnormality. Assessment Active Problems ICD-10 Non-pressure chronic ulcer of other part of right foot with fat layer exposed Peripheral vascular disease, unspecified Atherosclerotic heart disease of native coronary artery without angina pectoris Essential (primary) hypertension Type 2 diabetes mellitus with hyperglycemia Type 2 diabetes mellitus with diabetic neuropathy, unspecified Cerebral infarction, unspecified Procedures Wound #2 Pre-procedure diagnosis of Wound #2 is [name] Diabetic Wound/Ulcer of the Lower Extremity located on the Right,Plantar Foot .Severity of Tissue Pre Debridement is: Fat layer exposed. There was [name] Excisional Skin/Subcutaneous Tissue Debridement with [name] total area of 1.68 sq cm performed by [name] Maudlin, MD. With the following instrument(s): Curette to remove Non-Viable tissue/material. Material removed includes Callus, Subcutaneous Tissue, Slough, and Skin: [place], [place] ([id number]) [phone].pdf Page 7 of 9 Epidermis after achieving pain control using Lidocaine 4% Topical Solution. No specimens were taken. [name] time out was conducted at 10:23, prior to the start of the procedure. [name] Minimum amount of bleeding was controlled with Pressure. The procedure was tolerated well. Post Debridement Measurements: 1.2cm length x 1.4cm width x 0.1cm depth; 0.132cm^3 volume. Character of Wound/Ulcer Post  Debridement is improved. Severity of Tissue Post Debridement is: Fat layer exposed. Post procedure Diagnosis Wound #2: Same as Pre-Procedure General Notes: scribed for Dr. Celine [name] by [name] Peals, RN. Plan Follow-up Appointments: Return Appointment in 1 week. - Dr. Celine [name] - room 2 TCC next week Anesthetic: (In clinic) Topical Lidocaine 4% applied to wound bed Bathing/ Shower/ Hygiene: May shower and wash wound with soap and water. Edema Control - Lymphedema / SCD / Other: Elevate legs to the level of the heart or above for 30 minutes daily and/or when sitting for 3-4 times [name] day throughout the day. Avoid standing for long periods of time. Patient to wear own compression stockings every day. Moisturize  legs daily. Off-Loading: Other: - forefront offloading shoe The following medication(s) was prescribed: lidocaine topical 4 % cream cream topical was prescribed at facility WOUND #2: - Foot Wound Laterality: Plantar, Right Cleanser: Soap and Water 1 x Per Day/30 Days Discharge Instructions: May shower and wash wound with dial antibacterial soap and water prior to dressing change. Cleanser: Wound Cleanser 1 x Per Day/30 Days Discharge Instructions: Cleanse the wound with wound cleanser prior to applying [name] clean dressing using gauze sponges, not tissue or cotton balls. Prim Dressing: Hydrofera Blue Ready Transfer Foam, 2.5x2.5 (in/in) 1 x Per Day/30 Days ary Discharge Instructions: Apply directly to wound bed as directed Secondary Dressing: Optifoam Non-Adhesive Dressing, 4x4 in 1 x Per Day/30 Days Discharge Instructions: Apply over primary dressing as directed. Secondary Dressing: Woven Gauze Sponge, Non-Sterile 4x4 in 1 x Per Day/30 Days Discharge Instructions: Apply over primary dressing as directed. Secured With: Elastic Bandage 4 inch (ACE bandage) 1 x Per Day/30 Days Discharge Instructions: Secure with ACE bandage as directed. Secured With: [employer], 4.5x3.1 (in/yd) 1  x Per Day/30 Days Discharge Instructions: Secure with Kerlix as directed. 10/14/2022: There has been [name] lot of moisture related tissue breakdown. He is still forming callus over the top and then it seems that moisture gets underneath the callus and causes tissue damage. There is slough on the exposed wound surface. I used [name] curette to debride callus, skin, slough, and subcutaneous tissue from his wound. I think we are going to need to put him in [name] total contact cast. As this is his right foot, I explained to him that he will not be able to drive with [name] cast and boot on. Since they are moving back locally this weekend, we will wait until next week to apply it. For this week, I am going to change his contact layer to [employer] to see if this makes any difference. He should continue to use [name] blow dryer on the low setting to try and keep the foot dry after showers. Follow-up in 1 week. Electronic Signature(s) Signed: 10/14/2022 10:53:25 AM By: [name] Maudlin MD FACS Entered By: [name] on 10/14/2022 10:53:24 -------------------------------------------------------------------------------- HxROS Details Patient Name: Date of Service: [name], [place] [name]. 10/14/2022 10:45 [name] Medical Record Number: [id number] Patient Account Number: [id number] Date of Birth/Sex: Treating RN: 1955-11-12 (67 y.o. M) Primary Care Provider: Riki [name] Other Clinician: Referring Provider: Treating Provider/Extender: [name] in Treatment: 6 Information Obtained From Patient Respiratory Medical History: Positive for: Sleep Apnea Cardiovascular [place], [place] ([id number]) [phone].pdf Page 8 of 9 Medical History: Positive for: Arrhythmia - [name]-Fib; Coronary Artery Disease - s/p CABG; Deep Vein Thrombosis; Hypertension; Myocardial Infarction; Peripheral Arterial Disease - s/p Fempop x 2 Past Medical History Notes: CVA x 3 Endocrine Medical  History: Positive for: Type II Diabetes Time with diabetes: since 1997 Treated with: Insulin, Oral agents Blood sugar tested every day: Yes Tested : 2-3x per day Musculoskeletal Medical History: Past Medical History Notes: carpal tunnel syndrome Neurologic Medical History: Positive for: Neuropathy Past Medical History Notes: stroke Oncologic Medical History: Past Medical History Notes: skin cancer Immunizations Pneumococcal Vaccine: Received Pneumococcal Vaccination: Yes Received Pneumococcal Vaccination On or After 60th Birthday: Yes Implantable Devices None Hospitalization / Surgery History Type of Hospitalization/Surgery colonoscopy polypectomy peripheral vascular cath shoulder arthroscopy carpal tunnel release coronary artery bypass appendectomy cardiac cath coronary angioplasty thrombectomy knee arthroplasty popliteal artery stent Family and Social History Unknown History: Yes; Never smoker; Marital Status - Single;  Alcohol Use: Never; Drug Use: No History; Caffeine Use: Rarely; Financial Concerns: No; Food, Clothing or Shelter Needs: No; Support System Lacking: No; Transportation Concerns: No Electronic Signature(s) Signed: 10/14/2022 10:54:12 AM By: [name] Maudlin MD FACS Entered By: [name] on 10/14/2022 10:51:17 -------------------------------------------------------------------------------- SuperBill Details Patient Name: Date of Service: [name], [place] [name]. 10/14/2022 Medical Record Number: [id number] Patient Account Number: [id number] Date of Birth/Sex: Treating RN: [birth year] (67 y.o. M) Primary Care Provider: Riki [name] Other Clinician: Referring Provider: Treating Provider/Extender: [name] in Treatment: [place], [place] [name] ([id number]) [id number] Page 9 of 9 Diagnosis Coding ICD-10 Codes Code Description [id number] Non-pressure chronic ulcer of other part of right foot with fat  layer exposed I73.9 Peripheral vascular disease, unspecified I25.10 Atherosclerotic heart disease of native coronary artery without angina pectoris I10 Essential (primary) hypertension E11.65 Type 2 diabetes mellitus with hyperglycemia E11.40 Type 2 diabetes mellitus with diabetic neuropathy, unspecified I63.9 Cerebral infarction, unspecified Facility Procedures : CPT4 Code: [id number] Description: [id number] - DEB SUBQ TISSUE 20 SQ CM/< ICD-10 Diagnosis Description L97.512 Non-pressure chronic ulcer of other part of right foot with fat layer exposed Modifier: Quantity: 1 Physician Procedures : CPT4 Code Description Modifier [id number] - WC PHYS LEVEL 3 - EST PT 25 ICD-10 Diagnosis Description L97.512 Non-pressure chronic ulcer of other part of right foot with fat layer exposed I73.9 Peripheral vascular disease, unspecified E11.65 Type 2  diabetes mellitus with hyperglycemia E11.40 Type 2 diabetes mellitus with diabetic neuropathy, unspecified Quantity: 1 : [id number] - WC PHYS SUBQ TISS 20 SQ CM ICD-10 Diagnosis Description L97.512 Non-pressure chronic ulcer of other part of right foot with fat layer exposed Quantity: 1 Electronic Signature(s) Signed: 10/14/2022 10:53:50 AM By: [name] Maudlin MD FACS Entered By: [name] on 10/14/2022 10:53:50

## 2022-10-21 ENCOUNTER — Encounter (HOSPITAL_BASED_OUTPATIENT_CLINIC_OR_DEPARTMENT_OTHER): Payer: Medicare HMO | Attending: General Surgery | Admitting: General Surgery

## 2022-10-21 DIAGNOSIS — Z8673 Personal history of transient ischemic attack (TIA), and cerebral infarction without residual deficits: Secondary | ICD-10-CM | POA: Insufficient documentation

## 2022-10-21 DIAGNOSIS — E11621 Type 2 diabetes mellitus with foot ulcer: Secondary | ICD-10-CM | POA: Insufficient documentation

## 2022-10-21 DIAGNOSIS — L97821 Non-pressure chronic ulcer of other part of left lower leg limited to breakdown of skin: Secondary | ICD-10-CM | POA: Insufficient documentation

## 2022-10-21 DIAGNOSIS — I5032 Chronic diastolic (congestive) heart failure: Secondary | ICD-10-CM | POA: Insufficient documentation

## 2022-10-21 DIAGNOSIS — I110 Hypertensive heart disease with heart failure: Secondary | ICD-10-CM | POA: Insufficient documentation

## 2022-10-21 DIAGNOSIS — E1140 Type 2 diabetes mellitus with diabetic neuropathy, unspecified: Secondary | ICD-10-CM | POA: Insufficient documentation

## 2022-10-21 DIAGNOSIS — E1151 Type 2 diabetes mellitus with diabetic peripheral angiopathy without gangrene: Secondary | ICD-10-CM | POA: Diagnosis not present

## 2022-10-21 DIAGNOSIS — L97512 Non-pressure chronic ulcer of other part of right foot with fat layer exposed: Secondary | ICD-10-CM | POA: Diagnosis not present

## 2022-10-21 DIAGNOSIS — I2510 Atherosclerotic heart disease of native coronary artery without angina pectoris: Secondary | ICD-10-CM | POA: Insufficient documentation

## 2022-10-21 NOTE — Progress Notes (Signed)
[place], [place] A ([id number]) [id number] Page 1 of 7 Visit Report for 10/21/2022 Arrival Information Details Patient Name: Date of Service: [place], [place] RK A. 10/21/2022 10:45 A M Medical Record Number: [id number] Patient Account Number: [id number] Date of Birth/Sex: Treating RN: 1955-11-03 (67 y.o. [name] Primary Care Shalona Harbour: Riki Sheer Other Clinician: Referring Creedence Heiss: Treating Drayson Dorko/Extender: Louann Sjogren in Treatment: 7 Visit Information History Since Last Visit Added or deleted any medications: No Patient Arrived: Ambulatory Any new allergies or adverse reactions: No Arrival Time: 10:31 Had a fall or experienced change in No Accompanied By: partner activities of daily living that may affect Transfer Assistance: None risk of falls: Patient Identification Verified: Yes Signs or symptoms of abuse/neglect since last visito No Secondary Verification Process Completed: Yes Hospitalized since last visit: No Implantable device outside of the clinic excluding No cellular tissue based products placed in the center since last visit: Has Dressing in Place as Prescribed: Yes Pain Present Now: No Electronic Signature(s) Signed: 10/21/2022 4:18:38 PM By: Adline Peals Entered By: Adline Peals on 10/21/2022 10:31:57 -------------------------------------------------------------------------------- Compression Therapy Details Patient Name: Date of Service: Isabel Caprice, [place] A. 10/21/2022 10:45 A M Medical Record Number: [id number] Patient Account Number: [id number] Date of Birth/Sex: Treating RN: Sep 18, 1955 (67 y.o. [name] Primary Care Yehuda Printup: Riki Sheer Other Clinician: Referring Eh Sesay: Treating Lulabelle Desta/Extender: Louann Sjogren in Treatment: 7 Compression Therapy Performed for Wound Assessment: NonWound Condition Lymphedema - Left Leg Performed By:  Clinician Adline Peals, RN Compression Type: Three Layer Post Procedure Diagnosis Same as Pre-procedure Electronic Signature(s) Signed: 10/21/2022 4:18:38 PM By: Adline Peals Entered By: Adline Peals on 10/21/2022 10:42:10 -------------------------------------------------------------------------------- Encounter Discharge Information Details Patient Name: Date of Service: Isabel Caprice, [place] RK A. 10/21/2022 10:45 A M Medical Record Number: [id number] Patient Account Number: [id number] Date of Birth/Sex: Treating RN: 12/20/1955 (67 y.o. [name] Primary Care Thurman Sarver: Riki Sheer Other Clinician: Referring Kie Calvin: Treating Ajahni Nay/Extender: Louann Sjogren in Treatment: 7 Encounter Discharge Information Items Discharge Condition: Stable Ambulatory Status: Ambulatory Discharge Destination: Home Transportation: Private Auto Accompanied By: partner Isabel Caprice, [place] A ([id number]) [id number] Page 2 of 7 Schedule Follow-up Appointment: Yes Clinical Summary of Care: Patient Declined Electronic Signature(s) Signed: 10/21/2022 4:18:38 PM By: Adline Peals Entered By: Adline Peals on 10/21/2022 11:03:25 -------------------------------------------------------------------------------- Lower Extremity Assessment Details Patient Name: Date of Service: Isabel Caprice, [place] RK A. 10/21/2022 10:45 A M Medical Record Number: [id number] Patient Account Number: [id number] Date of Birth/Sex: Treating RN: October 31, 1955 (67 y.o. [name] Primary Care Neno Hohensee: Riki Sheer Other Clinician: Referring Horald Birky: Treating Gillis Boardley/Extender: Louann Sjogren in Treatment: 7 Edema Assessment Assessed: Shirlyn Goltz: No] [Right: No] [Left: Edema] [Right: :] Calf Left: Right: Point of Measurement: From Medial Instep 43 cm Ankle Left: Right: Point of Measurement: From Medial Instep 28.1 cm Electronic  Signature(s) Signed: 10/21/2022 4:18:38 PM By: Adline Peals Entered By: Adline Peals on 10/21/2022 10:33:22 -------------------------------------------------------------------------------- Multi Wound Chart Details Patient Name: Date of Service: Isabel Caprice, [place] RK A. 10/21/2022 10:45 A M Medical Record Number: [id number] Patient Account Number: [id number] Date of Birth/Sex: Treating RN: [birth year] (67 y.o. M) Primary Care Leza Apsey: Riki Sheer Other Clinician: Referring Shilo Philipson: Treating Sareena Odeh/Extender: Louann Sjogren in Treatment: 7 Vital Signs Height(in): 70 Pulse(bpm): 76 Weight(lbs): 260 Blood Pressure(mmHg): 198/74 Body Mass Index(BMI): 37.3 Temperature(F): 97.7 Respiratory Rate(breaths/min): 16 [2:Photos:] [N/A:N/A] Right, Plantar Foot N/A N/A Wound Location: Gradually Appeared N/A N/A Wounding Event: Diabetic Wound/Ulcer of the Lower N/A N/A Primary Etiology: Extremity  Sleep Apnea, Arrhythmia, Coronary N/A N/A Comorbid History: Artery Disease, Deep Vein Neighb[place], [place] A ([id number].pdf Page 3 of 7 Thrombosis, Hypertension, Myocardial Infarction, Peripheral Arterial Disease, Type II Diabetes, Neuropathy 07/08/2022 N/A N/A Date Acquired: 7 N/A N/A Weeks of Treatment: Open N/A N/A Wound Status: No N/A N/A Wound Recurrence: 0.6x3.5x0.1 N/A N/A Measurements L x W x D (cm) 1.649 N/A N/A A (cm) : rea 0.165 N/A N/A Volume (cm) : 35.40% N/A N/A % Reduction in A rea: 35.30% N/A N/A % Reduction in Volume: 12 Starting Position 1 (o'clock): 1 Ending Position 1 (o'clock): 0.3 Maximum Distance 1 (cm): Yes N/A N/A Undermining: Grade 1 N/A N/A Classification: Medium N/A N/A Exudate A mount: Serosanguineous N/A N/A Exudate Type: red, brown N/A N/A Exudate Color: Distinct, outline attached N/A N/A Wound Margin: Large (67-100%) N/A N/A Granulation A mount: Red N/A N/A Granulation  Quality: Small (1-33%) N/A N/A Necrotic A mount: Fat Layer (Subcutaneous Tissue): Yes N/A N/A Exposed Structures: Fascia: No Tendon: No Muscle: No Joint: No Bone: No Medium (34-66%) N/A N/A Epithelialization: Callus: Yes N/A N/A Periwound Skin Texture: No Abnormalities Noted N/A N/A Periwound Skin Moisture: No Abnormalities Noted N/A N/A Periwound Skin Color: No Abnormality N/A N/A Temperature: T Contact Cast otal N/A N/A Procedures Performed: Treatment Notes Wound #2 (Foot) Wound Laterality: Plantar, Right Cleanser Soap and Water Discharge Instruction: May shower and wash wound with dial antibacterial soap and water prior to dressing change. Wound Cleanser Discharge Instruction: Cleanse the wound with wound cleanser prior to applying a clean dressing using gauze sponges, not tissue or cotton balls. Peri-Wound Care Topical Primary Dressing Hydrofera Blue Ready Transfer Foam, 2.5x2.5 (in/in) Discharge Instruction: Apply directly to wound bed as directed Secondary Dressing Woven Gauze Sponge, Non-Sterile 4x4 in Discharge Instruction: Apply over primary dressing as directed. Zetuvit Plus 4x8 in Discharge Instruction: Apply over primary dressing as directed. Secured With [employer], 4.5x3.1 (in/yd) Discharge Instruction: Secure with Kerlix as directed. Compression Wrap Compression Stockings Add-Ons Electronic Signature(s) Signed: 10/21/2022 12:15:06 PM By: Fredirick Maudlin MD FACS Entered By: Fredirick Maudlin on 10/21/2022 12:15:06 [place], [place] A ([id number].pdf Page 4 of 7 -------------------------------------------------------------------------------- Multi-Disciplinary Care Plan Details Patient Name: Date of Service: [place], [place] RK A. 10/21/2022 10:45 A M Medical Record Number: [id number] Patient Account Number: [id number] Date of Birth/Sex: Treating RN: [birth year] (67 y.o. [name] Primary Care Mcclain Shall: Riki Sheer Other Clinician: Referring Mckaila Duffus: Treating Ronal Maybury/Extender: Louann Sjogren in Treatment: 7 Active Inactive Abuse / Safety / Falls / Self Care Management Nursing Diagnoses: Impaired physical mobility Potential for falls Goals: Patient will not experience any injury related to falls Date Initiated: 08/31/2022 Target Resolution Date: 12/18/2022 Goal Status: Active Patient/caregiver will verbalize/demonstrate measures taken to improve the patient's personal safety Date Initiated: 08/31/2022 Target Resolution Date: 01/15/2023 Goal Status: Active Interventions: Provide education on basic hygiene Provide education on fall prevention Notes: Wound/Skin Impairment Nursing Diagnoses: Impaired tissue integrity Knowledge deficit related to ulceration/compromised skin integrity Goals: Patient/caregiver will verbalize understanding of skin care regimen Date Initiated: 08/31/2022 Target Resolution Date: 12/19/2022 Goal Status: Active Interventions: Assess patient/caregiver ability to obtain necessary supplies Assess patient/caregiver ability to perform ulcer/skin care regimen upon admission and as needed Assess ulceration(s) every visit Treatment Activities: Skin care regimen initiated : 08/31/2022 Topical wound management initiated : 08/31/2022 Notes: Electronic Signature(s) Signed: 10/21/2022 4:18:38 PM By: Adline Peals Entered By: Adline Peals on 10/21/2022 11:02:41 -------------------------------------------------------------------------------- Pain Assessment Details Patient Name: Date of Service: [place], [place] A. 10/21/2022  10:45 A M Medical Record Number: [id number] Patient Account Number: [id number] Date of Birth/Sex: Treating RN: 07-16-1956 (67 y.o. [name] Primary Care Ronnette Rump: Riki Sheer Other Clinician: Referring Temesha Queener: Treating Jamell Laymon/Extender: Louann Sjogren in Treatment:  7 Active Problems Location of Pain Severity and Description of Pain Patient Has Paino No [place], [place] A ([id number]) [phone].pdf Page 5 of 7 Patient Has Paino No Site Locations Rate the pain. Current Pain Level: 0 Pain Management and Medication Current Pain Management: Electronic Signature(s) Signed: 10/21/2022 4:18:38 PM By: Adline Peals Entered By: Adline Peals on 10/21/2022 10:32:51 -------------------------------------------------------------------------------- Patient/Caregiver Education Details Patient Name: Date of Service: Ileana Ladd 4/3/2024andnbsp10:45 A M Medical Record Number: [id number] Patient Account Number: [id number] Date of Birth/Gender: Treating RN: [birth year] (67 y.o. [name] Primary Care Physician: Riki Sheer Other Clinician: Referring Physician: Treating Physician/Extender: Louann Sjogren in Treatment: 7 Education Assessment Education Provided To: Patient Education Topics Provided Offloading: Methods: Explain/Verbal Responses: Reinforcements needed, State content correctly Electronic Signature(s) Signed: 10/21/2022 4:18:38 PM By: Adline Peals Entered By: Adline Peals on 10/21/2022 11:03:01 -------------------------------------------------------------------------------- Wound Assessment Details Patient Name: Date of Service: Isabel Caprice, [place] RK A. 10/21/2022 10:45 A M Medical Record Number: [id number] Patient Account Number: [id number] Date of Birth/Sex: Treating RN: 05/14/1956 (67 y.o. [name] Primary Care Mesiah Manzo: Riki Sheer Other Clinician: Referring Nagee Goates: Treating Garnetta Fedrick/Extender: Louann Sjogren in Treatment: 7 Wound Status Wound Number: 2 Primary Diabetic Wound/Ulcer of the Lower Extremity Etiology: Wound Location: Right, Plantar Foot Wound Open Wounding Event: Gradually Appeared Status: Date  Acquired: 07/08/2022 BANJAMIN, [place] A ([id number]) [phone].pdf Page 6 of 7 Date Acquired: 07/08/2022 Comorbid Sleep Apnea, Arrhythmia, Coronary Artery Disease, Deep Vein Weeks Of Treatment: 7 History: Thrombosis, Hypertension, Myocardial Infarction, Peripheral Arterial Clustered Wound: No Disease, Type II Diabetes, Neuropathy Photos Wound Measurements Length: (cm) 0.6 Width: (cm) 3.5 Depth: (cm) 0.1 Area: (cm) 1.649 Volume: (cm) 0.165 % Reduction in Area: 35.4% % Reduction in Volume: 35.3% Epithelialization: Medium (34-66%) Tunneling: No Undermining: Yes Starting Position (o'clock): 12 Ending Position (o'clock): 1 Maximum Distance: (cm) 0.3 Wound Description Classification: Grade 1 Wound Margin: Distinct, outline attached Exudate Amount: Medium Exudate Type: Serosanguineous Exudate Color: red, brown Foul Odor After Cleansing: No Slough/Fibrino Yes Wound Bed Granulation Amount: Large (67-100%) Exposed Structure Granulation Quality: Red Fascia Exposed: No Necrotic Amount: Small (1-33%) Fat Layer (Subcutaneous Tissue) Exposed: Yes Necrotic Quality: Adherent Slough Tendon Exposed: No Muscle Exposed: No Joint Exposed: No Bone Exposed: No Periwound Skin Texture Texture Color No Abnormalities Noted: No No Abnormalities Noted: Yes Callus: Yes Temperature / Pain Temperature: No Abnormality Moisture No Abnormalities Noted: Yes Treatment Notes Wound #2 (Foot) Wound Laterality: Plantar, Right Cleanser Soap and Water Discharge Instruction: May shower and wash wound with dial antibacterial soap and water prior to dressing change. Wound Cleanser Discharge Instruction: Cleanse the wound with wound cleanser prior to applying a clean dressing using gauze sponges, not tissue or cotton balls. Peri-Wound Care Topical Primary Dressing Hydrofera Blue Ready Transfer Foam, 2.5x2.5 (in/in) Discharge Instruction: Apply directly to wound bed as  directed Secondary Dressing Woven Gauze Sponge, Non-Sterile 4x4 in Discharge Instruction: Apply over primary dressing as directed. [place], [place] A ([id number]) [id number] Page 7 of 7 Zetuvit Plus 4x8 in Discharge Instruction: Apply over primary dressing as directed. Secured With [employer], 4.5x3.1 (in/yd) Discharge Instruction: Secure with Kerlix as directed. Compression Wrap Compression Stockings Add-Ons Electronic Signature(s) Signed: 10/21/2022 4:18:38 PM By: Adline Peals Entered By: Adline Peals on 10/21/2022  10:37:41 -------------------------------------------------------------------------------- Vitals Details Patient Name: Date of Service: [place], [place] RK A. 10/21/2022 10:45 A M Medical Record Number: [id number] Patient Account Number: [id number] Date of Birth/Sex: Treating RN: 1955/08/15 (67 y.o. [name] Primary Care Coran Dipaola: Riki Sheer Other Clinician: Referring Gianna Calef: Treating Kalle Bernath/Extender: Louann Sjogren in Treatment: 7 Vital Signs Time Taken: 10:32 Temperature (F): 97.7 Height (in): 70 Pulse (bpm): 76 Weight (lbs): 260 Respiratory Rate (breaths/min): 16 Body Mass Index (BMI): 37.3 Blood Pressure (mmHg): 198/74 Reference Range: 80 - 120 mg / dl Electronic Signature(s) Signed: 10/21/2022 4:18:38 PM By: Adline Peals Entered By: Adline Peals on 10/21/2022 10:33:15

## 2022-10-21 NOTE — Progress Notes (Signed)
**Note [name]-Identified via Obfuscation** [place], [place] ([id number]) [id number] Page 1 of 9 Visit Report for 10/21/2022 Chief Complaint Document Details Patient Name: Date of Service: [name], [name] 10/21/2022 10:45 A M Medical Record Number: [id number] Patient Account Number: [id number] Date of Birth/Sex: Treating RN: 1956-05-03 (67 y.o. M) Primary Care Provider: Riki Sheer Other Clinician: Referring Provider: Treating Provider/Extender: Louann Sjogren in Treatment: 7 Information Obtained from: Patient Chief Complaint 05/14/2020; patient is here for review of an abrasion injury on the left lateral calf 08/31/2022: [name] (1st metatarsal base, plantar) Electronic Signature(s) Signed: 10/21/2022 12:15:14 PM By: Fredirick Maudlin MD FACS Entered By: Fredirick Maudlin on 10/21/2022 12:15:13 -------------------------------------------------------------------------------- HPI Details Patient Name: Date of Service: [name], [name] RK A. 10/21/2022 10:45 A M Medical Record Number: [id number] Patient Account Number: [id number] Date of Birth/Sex: Treating RN: Dec 09, 1955 (67 y.o. M) Primary Care Provider: Riki Sheer Other Clinician: Referring Provider: Treating Provider/Extender: Louann Sjogren in Treatment: 7 History of Present Illness HPI Description: ADMISSION 05/14/2020; this is a 67 year old man with multiple medical issues. Predominantly he has type 2 diabetes with a history of peripheral neuropathy and also history of fairly significant PAD. He had a left superficial femoral to posterior tibial artery bypass in February 2017 he also had an atherectomy and angioplasty by Dr. Gwenlyn Found of the right popliteal artery in 2016. He is supposed to be getting arterial studies annually however this was interrupted last year because of the pandemic. He tells [place] he was at [facility] 2 weeks ago was getting out of of the scooter and traumatized his left lateral  lower leg. There was a lot of bleeding as the patient is on Plavix and Eliquis. They have been dressing this with Neosporin and doing a fairly good job. Wound measures 2.5 x 3.5 it does not have any depth he does not have a wound history in his legs outside of surgery however he does have chronic edema and skin changes suggestive of chronic venous disease possibly some degree of lymphedema as well. Past medical history includes type 2 diabetes with peripheral neuropathy and gait instability, lumbar spondylosis, obstructive sleep apnea, history of a left pontine CVA, basal cell skin cancer, atrial fibrillation on anticoagulation, significant PAD as noted with a left superficial femoral to posterior tibial bypass in February 2017 and a right popliteal atherectomy and angioplasty by Dr. Gwenlyn Found in 30th 2016. He also has a history of coronary artery disease with an MI in 2002 hypertension hyperlipidemia and heart failure with preserved ejection fraction His last arterial studies I can see in epic were on 03/10/2018 this showed a right ABI of 0.69 and a right TBI of 0.5 with monophasic waveforms on the right. On the left his ABI was 1.20 with a TBI of 0.92 and triphasic waveforms. He has not had arterial studies since. Our nurse in the clinic got an ABI on the left of 1.1 11/2; left anterior leg wound in the setting of chronic venous insufficiency. Wound was initially trauma. We have been using Hydrofera Blue under compression he has home health.. The wound looks a lot better today with improvement in surface area 11/16; left anterior leg wound in the setting of chronic venous insufficiency. Wound was initially trauma. We have been using Hydrofera Blue under compression. The patient is closed today. He is supposed to follow-up with vein and vascular with regards to arterial insufficiency nevertheless his leg wounds are 12/3; apparently 2 weeks ago when they were putting on their stockings they managed  to get  3 wounds on the left anterior lower leg from abrasion when putting on the stockings. Home health came by the week of Thanksgiving and put Hydrofera Blue 4-layer wrap on this and there is only one superficial area remaining. The patient and his wife complained about the difficulties getting stockings on I think we are using 20/30. We will order bilateral external compression stockings which should be easier. 12/10; wound on the left anterior lower leg is closed. He has chronic venous insufficiency we ordered him Farrow wrap stockings unfortunately he did not bring these in. 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 He says that it has been present since mid December. He is currently residing in [facility] until March and a podiatrist has been looking after him there. They have been simply painting the area with Betadine. He has not had any lower extremity arterial studies since 2019, at which time his right ABI was 0.69. Measured in clinic today, it was 0.71. He is not aware of his most recent hemoglobin A1c, but historically he has had exceptionally poor control. On the basis of his right first metatarsal, there is a small crescent shaped wound. There is surrounding eschar and callus. There is no malodor or purulent Fazzini, Tevin A ([id number]) [phone].pdf Page 2 of 9 drainage. 09/08/2022: The original wound is smaller today and fairly clean, but there is some discoloration and a pulpy texture to the adjacent callus. Underneath this, the tissue is open exposing the fat layer. It looks like perhaps there was a crack in the callus and moisture got under the skin and caused breakdown. 09/15/2022: There has been more moisture related tissue breakdown. The callus is very soft and the underlying tissue is more open. 09/28/2022: The wound looks much better. He has done a good job keeping it dry. There is some callus  overlying much of the wound surface. There is slough on the exposed open areas. 10/14/2022: There has been a lot of moisture related tissue breakdown. He is still forming callus over the top and then it seems that moisture gets underneath the callus and causes tissue damage. There is slough on the exposed wound surface. They will be moving back to the [place] area [place] the [place] this [place]. 10/21/2022: His foot is less wet, but there is no significant change to his wound. He has developed a blister on his left anterior tibial surface. There is no open wound here, but he does have some fairly significant edema. We are planning to apply a total contact cast today. Electronic Signature(s) Signed: 10/21/2022 12:16:10 PM By: Fredirick Maudlin MD FACS Entered By: Fredirick Maudlin on 10/21/2022 12:16:09 -------------------------------------------------------------------------------- Physical Exam Details Patient Name: Date of Service: [name] Caprice, [name] [place] A. 10/21/2022 10:45 A M Medical Record Number: [id number] Patient Account Number: [id number] Date of Birth/Sex: Treating RN: 03-Sep-1955 (67 y.o. M) Primary Care Provider: Riki Sheer Other Clinician: Referring Provider: Treating Provider/Extender: Louann Sjogren in Treatment: 7 Constitutional Hypertensive, asymptomatic. . . . no acute distress. Respiratory Normal work of breathing on room air. Notes 10/21/2022: His foot is less wet, but there is no significant change to his wound. He has developed a blister on his left anterior tibial surface. There is no open wound here, but he does have some fairly significant edema. Electronic Signature(s) Signed: 10/21/2022 12:16:39 PM By: Fredirick Maudlin MD FACS Entered By: Fredirick Maudlin on 10/21/2022 12:16:39 -------------------------------------------------------------------------------- Physician Orders Details  Patient Name: Date of Service: GRAYS, [name] [place] A. 10/21/2022 10:45 A  M Medical Record Number: [id number] Patient Account Number: [id number] Date of Birth/Sex: Treating RN: [birth year] (67 y.o. [name] Primary Care Provider: Riki Sheer Other Clinician: Referring Provider: Treating Provider/Extender: Louann Sjogren in Treatment: 7 Verbal / Phone Orders: No Diagnosis Coding ICD-10 Coding Code Description [phone] Non-pressure chronic ulcer of other part of right foot with fat layer exposed I73.9 Peripheral vascular disease, unspecified I25.10 Atherosclerotic heart disease of native coronary artery without angina pectoris I10 Essential (primary) hypertension E11.65 Type 2 diabetes mellitus with hyperglycemia E11.40 Type 2 diabetes mellitus with diabetic neuropathy, unspecified I63.9 Cerebral infarction, unspecified Follow-up Appointments ppointment in 1 week. - Dr. Celine Ahr - room 2 Return [place], [place] A ([id number]) [phone].pdf Page 3 of 9 Other: - CAST CHANGE FRIDAY 4/5 AT 8:45 am Anesthetic (In clinic) Topical Lidocaine 4% applied to wound bed Bathing/ Shower/ Hygiene May shower and wash wound with soap and water. Edema Control - Lymphedema / SCD / Other Elevate legs to the level of the heart or above for 30 minutes daily and/or when sitting for 3-4 times a day throughout the day. Avoid standing for long periods of time. Patient to wear own compression stockings every day. Moisturize legs daily. Off-Loading Total Contact Cast to Right Lower Extremity Removable cast walker boot to: - right leg Other: - forefront offloading shoe Non Wound Condition Left Lower Extremity Other Non Wound Condition Orders/Instructions: - 3 layer wrap with silver alginate over blister Wound Treatment Wound #2 - Foot Wound Laterality: Plantar, Right Cleanser: Soap and Water 1 x Per Week/30 Days Discharge Instructions: May shower and wash wound with dial antibacterial soap and water prior to  dressing change. Cleanser: Wound Cleanser 1 x Per Week/30 Days Discharge Instructions: Cleanse the wound with wound cleanser prior to applying a clean dressing using gauze sponges, not tissue or cotton balls. Prim Dressing: Hydrofera Blue Ready Transfer Foam, 2.5x2.5 (in/in) 1 x Per Week/30 Days ary Discharge Instructions: Apply directly to wound bed as directed Secondary Dressing: Woven Gauze Sponge, Non-Sterile 4x4 in 1 x Per Week/30 Days Discharge Instructions: Apply over primary dressing as directed. Secondary Dressing: Zetuvit Plus 4x8 in 1 x Per Week/30 Days Discharge Instructions: Apply over primary dressing as directed. Secured With: [employer], 4.5x3.1 (in/yd) 1 x Per Week/30 Days Discharge Instructions: Secure with Kerlix as directed. Patient Medications llergies: No Known Drug Allergies A Notifications Medication Indication Start End 10/21/2022 lidocaine DOSE topical 4 % cream - cream topical Electronic Signature(s) Signed: 10/21/2022 12:22:58 PM By: Fredirick Maudlin MD FACS Entered By: Fredirick Maudlin on 10/21/2022 12:18:25 -------------------------------------------------------------------------------- Problem List Details Patient Name: Date of Service: [name], [place] RK A. 10/21/2022 10:45 A M Medical Record Number: [id number] Patient Account Number: [id number] Date of Birth/Sex: Treating RN: [birth year] (67 y.o. M) Primary Care Provider: Riki Sheer Other Clinician: Referring Provider: Treating Provider/Extender: Louann Sjogren in Treatment: 7 Active Problems ICD-10 Encounter Code Description Active Date MDM Diagnosis L97.512 Non-pressure chronic ulcer of other part of right foot with fat layer exposed 08/31/2022 No Yes [place], [place] A ([id number]) [phone].pdf Page 4 of 9 I73.9 Peripheral vascular disease, unspecified 08/31/2022 No Yes I25.10 Atherosclerotic heart disease of native coronary artery without  angina pectoris 08/31/2022 No Yes I10 Essential (primary) hypertension 08/31/2022 No Yes E11.65 Type 2 diabetes mellitus with hyperglycemia 08/31/2022 No Yes E11.40 Type 2 diabetes mellitus with diabetic neuropathy, unspecified 08/31/2022 No Yes I63.9 Cerebral infarction, unspecified 08/31/2022  No Yes Inactive Problems Resolved Problems Electronic Signature(s) Signed: 10/21/2022 12:15:00 PM By: Fredirick Maudlin MD FACS Entered By: Fredirick Maudlin on 10/21/2022 12:15:00 -------------------------------------------------------------------------------- Progress Note Details Patient Name: Date of Service: [name], [name] 10/21/2022 10:45 A M Medical Record Number: [id number] Patient Account Number: [id number] Date of Birth/Sex: Treating RN: 02-Jul-1956 (67 y.o. M) Primary Care Provider: Riki Sheer Other Clinician: Referring Provider: Treating Provider/Extender: Louann Sjogren in Treatment: 7 Subjective Chief Complaint Information obtained from Patient 05/14/2020; patient is here for review of an abrasion injury on the left lateral calf 08/31/2022: [name] (1st metatarsal base, plantar) History of Present Illness (HPI) ADMISSION 05/14/2020; this is a 67 year old man with multiple medical issues. Predominantly he has type 2 diabetes with a history of peripheral neuropathy and also history of fairly significant PAD. He had a left superficial femoral to posterior tibial artery bypass in February 2017 he also had an atherectomy and angioplasty by Dr. Gwenlyn Found of the right popliteal artery in 2016. He is supposed to be getting arterial studies annually however this was interrupted last year because of the pandemic. He tells [place] he was at [facility] 2 weeks ago was getting out of of the scooter and traumatized his left lateral lower leg. There was a lot of bleeding as the patient is on Plavix and Eliquis. They have been dressing this with Neosporin and doing a fairly good  job. Wound measures 2.5 x 3.5 it does not have any depth he does not have a wound history in his legs outside of surgery however he does have chronic edema and skin changes suggestive of chronic venous disease possibly some degree of lymphedema as well. Past medical history includes type 2 diabetes with peripheral neuropathy and gait instability, lumbar spondylosis, obstructive sleep apnea, history of a left pontine CVA, basal cell skin cancer, atrial fibrillation on anticoagulation, significant PAD as noted with a left superficial femoral to posterior tibial bypass in February 2017 and a right popliteal atherectomy and angioplasty by Dr. Gwenlyn Found in 30th 2016. He also has a history of coronary artery disease with an MI in 2002 hypertension hyperlipidemia and heart failure with preserved ejection fraction His last arterial studies I can see in epic were on 03/10/2018 this showed a right ABI of 0.69 and a right TBI of 0.5 with monophasic waveforms on the right. On the left his ABI was 1.20 with a TBI of 0.92 and triphasic waveforms. He has not had arterial studies since. Our nurse in the clinic got an ABI on the left of 1.1 11/2; left anterior leg wound in the setting of chronic venous insufficiency. Wound was initially trauma. We have been using Hydrofera Blue under compression he has home health.. The wound looks a lot better today with improvement in surface area 11/16; left anterior leg wound in the setting of chronic venous insufficiency. Wound was initially trauma. We have been using Hydrofera Blue under compression. The patient is closed today. He is supposed to follow-up with vein and vascular with regards to arterial insufficiency nevertheless his leg wounds are [place], Josph A ([id number]) [phone].pdf Page 5 of 9 12/3; apparently 2 weeks ago when they were putting on their stockings they managed to get 3 wounds on the left anterior lower leg from abrasion when putting on  the stockings. Home health came by the week of Thanksgiving and put Hydrofera Blue 4-layer wrap on this and there is only one superficial area remaining. The patient and his wife complained about the  difficulties getting stockings on I think we are using 20/30. We will order bilateral external compression stockings which should be easier. 12/10; wound on the left anterior lower leg is closed. He has chronic venous insufficiency we ordered him Farrow wrap stockings unfortunately he did not bring these in. 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 He says that it has been present since mid December. He is currently residing in [facility] until March and a podiatrist has been looking after him there. They have been simply painting the area with Betadine. He has not had any lower extremity arterial studies since 2019, at which time his right ABI was 0.69. Measured in clinic today, it was 0.71. He is not aware of his most recent hemoglobin A1c, but historically he has had exceptionally poor control. On the basis of his right first metatarsal, there is a small crescent shaped wound. There is surrounding eschar and callus. There is no malodor or purulent drainage. 09/08/2022: The original wound is smaller today and fairly clean, but there is some discoloration and a pulpy texture to the adjacent callus. Underneath this, the tissue is open exposing the fat layer. It looks like perhaps there was a crack in the callus and moisture got under the skin and caused breakdown. 09/15/2022: There has been more moisture related tissue breakdown. The callus is very soft and the underlying tissue is more open. 09/28/2022: The wound looks much better. He has done a good job keeping it dry. There is some callus overlying much of the wound surface. There is slough on the exposed open areas. 10/14/2022: There has been a lot of moisture related tissue  breakdown. He is still forming callus over the top and then it seems that moisture gets underneath the callus and causes tissue damage. There is slough on the exposed wound surface. They will be moving back to the local area from the beach this weekend. 10/21/2022: His foot is less wet, but there is no significant change to his wound. He has developed a blister on his left anterior tibial surface. There is no open wound here, but he does have some fairly significant edema. We are planning to apply a total contact cast today. Patient History Information obtained from Patient. Family History Unknown History. Social History Never smoker, Marital Status - Single, Alcohol Use - Never, Drug Use - No History, Caffeine Use - Rarely. Medical History Respiratory Patient has history of Sleep Apnea Cardiovascular Patient has history of Arrhythmia - A-Fib, Coronary Artery Disease - s/p CABG, Deep Vein Thrombosis, Hypertension, Myocardial Infarction, Peripheral Arterial Disease - s/p Fempop x 2 Endocrine Patient has history of Type II Diabetes Neurologic Patient has history of Neuropathy Hospitalization/Surgery History - colonoscopy. - polypectomy. - peripheral vascular cath. - shoulder arthroscopy. - carpal tunnel release. - coronary artery bypass. - appendectomy. - cardiac cath. - coronary angioplasty. - thrombectomy. - knee arthroplasty. - popliteal artery stent. 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 no acute distress. Vitals Time Taken: 10:32 AM, Height: 70 in, Weight: 260 lbs, BMI: 37.3, Temperature: 97.7 F, Pulse: 76 bpm, Respiratory Rate: 16 breaths/min, Blood Pressure: 198/74 mmHg. Respiratory Normal work of breathing on room air. General Notes: 10/21/2022: His foot is less wet, but there is no significant change to his wound. He has  developed a blister on his left anterior tibial surface.  There is no open wound here, but he does have some fairly significant edema. [place], [place] ([id number]) [id number] Page 6 of 9 Integumentary (Hair, Skin) Wound #2 status is Open. Original cause of wound was Gradually Appeared. The date acquired was: 07/08/2022. The wound has been in treatment 7 weeks. The wound is located on the [place]. The wound measures 0.6cm length x 3.5cm width x 0.1cm depth; 1.649cm^2 area and 0.165cm^3 volume. There is Fat Layer (Subcutaneous Tissue) exposed. There is no tunneling noted, however, there is undermining starting at 12:00 and ending at 1:00 with a maximum distance of 0.3cm. There is a medium amount of serosanguineous drainage noted. The wound margin is distinct with the outline attached to the wound base. There is large (67-100%) red granulation within the wound bed. There is a small (1-33%) amount of necrotic tissue within the wound bed including Adherent Slough. The periwound skin appearance had no abnormalities noted for moisture. The periwound skin appearance had no abnormalities noted for color. The periwound skin appearance exhibited: Callus. Periwound temperature was noted as No Abnormality. Assessment Active Problems ICD-10 Non-pressure chronic ulcer of other part of right foot with fat layer exposed Peripheral vascular disease, unspecified Atherosclerotic heart disease of native coronary artery without angina pectoris Essential (primary) hypertension Type 2 diabetes mellitus with hyperglycemia Type 2 diabetes mellitus with diabetic neuropathy, unspecified Cerebral infarction, unspecified Procedures Wound #2 Pre-procedure diagnosis of Wound #2 is a Diabetic Wound/Ulcer of the Lower Extremity located on the Right,Plantar Foot . There was a T Contact Cast otal Procedure by Fredirick Maudlin, MD. Post procedure Diagnosis Wound #2: Same as  Pre-Procedure Notes: scribed for Dr. Celine Ahr by Adline Peals, RN. There was a Three Layer Compression Therapy Procedure by Adline Peals, RN. Post procedure Diagnosis Wound #: Same as Pre-Procedure Plan Follow-up Appointments: Return Appointment in 1 week. - Dr. Celine Ahr - room 2 Other: - CAST CHANGE FRIDAY 4/5 AT 8:45 am Anesthetic: (In clinic) Topical Lidocaine 4% applied to wound bed Bathing/ Shower/ Hygiene: May shower and wash wound with soap and water. Edema Control - Lymphedema / SCD / Other: Elevate legs to the level of the heart or above for 30 minutes daily and/or when sitting for 3-4 times a day throughout the day. Avoid standing for long periods of time. Patient to wear own compression stockings every day. Moisturize legs daily. Off-Loading: T Contact Cast to Right Lower Extremity otal Removable cast walker boot to: - right leg Other: - forefront offloading shoe Non Wound Condition: Other Non Wound Condition Orders/Instructions: - 3 layer wrap with silver alginate over blister The following medication(s) was prescribed: lidocaine topical 4 % cream cream topical was prescribed at facility WOUND #2: - Foot Wound Laterality: Plantar, Right Cleanser: Soap and Water 1 x Per Week/30 Days Discharge Instructions: May shower and wash wound with dial antibacterial soap and water prior to dressing change. Cleanser: Wound Cleanser 1 x Per Week/30 Days Discharge Instructions: Cleanse the wound with wound cleanser prior to applying a clean dressing using gauze sponges, not tissue or cotton balls. Prim Dressing: Hydrofera Blue Ready Transfer Foam, 2.5x2.5 (in/in) 1 x Per Week/30 Days ary Discharge Instructions: Apply directly to wound bed as directed Secondary Dressing: Woven Gauze Sponge, Non-Sterile 4x4 in 1 x Per Week/30 Days Discharge Instructions: Apply over primary dressing as directed. Secondary Dressing: Zetuvit Plus 4x8 in 1 x Per Week/30 Days Discharge Instructions:  Apply over primary dressing as directed. Secured With: [employer], 4.5x3.1 (in/yd) 1 x Per Week/30 Days  Discharge Instructions: Secure with Kerlix as directed. 10/21/2022: His foot is less wet, but there is no significant change to his wound. He has developed a blister on his left anterior tibial surface. There is no open wound here, but he does have some fairly significant edema. [employer], [name] ([id number]) [id number] Page 7 of 9 The wound bed was prepared for total contact casting. Hydrofera Blue was applied along with an additional absorbent layer to minimize further tissue breakdown. A total contact cast was then applied without difficulty. We applied silver alginate and 3 layer compression to his blister on the left lower leg. He will return on Friday for his obligatory cast change. Electronic Signature(s) Signed: 10/21/2022 12:19:31 PM By: Fredirick Maudlin MD FACS Entered By: Fredirick Maudlin on 10/21/2022 12:19:31 -------------------------------------------------------------------------------- HxROS Details Patient Name: Date of Service: [name], [name] Ruth A. 10/21/2022 10:45 A M Medical Record Number: [id number] Patient Account Number: [id number] Date of Birth/Sex: Treating RN: 27-May-1956 (67 y.o. M) Primary Care Provider: Riki Sheer Other Clinician: Referring Provider: Treating Provider/Extender: Louann Sjogren in Treatment: 7 Information Obtained From Patient Respiratory Medical History: Positive for: Sleep Apnea Cardiovascular Medical History: Positive for: Arrhythmia - A-Fib; Coronary Artery Disease - s/p CABG; Deep Vein Thrombosis; Hypertension; Myocardial Infarction; Peripheral Arterial Disease - s/p Fempop x 2 Past Medical History Notes: CVA x 3 Endocrine Medical History: Positive for: Type II Diabetes Time with diabetes: since 1997 Treated with: Insulin, Oral agents Blood sugar tested every day: Yes Tested  : 2-3x per day Musculoskeletal Medical History: Past Medical History Notes: carpal tunnel syndrome Neurologic Medical History: Positive for: Neuropathy Past Medical History Notes: stroke Oncologic Medical History: Past Medical History Notes: skin cancer Immunizations Pneumococcal Vaccine: Received Pneumococcal Vaccination: Yes Received Pneumococcal Vaccination On or After 60th Birthday: Yes Implantable Devices None Hospitalization / Surgery History Type of Hospitalization/Surgery Dimichele, Kynan A ([id number] Page 8 of 9 colonoscopy polypectomy peripheral vascular cath shoulder arthroscopy carpal tunnel release coronary artery bypass appendectomy cardiac cath coronary angioplasty thrombectomy knee arthroplasty popliteal artery stent Family and Social History Unknown History: Yes; Never smoker; Marital Status - Single; Alcohol Use: Never; Drug Use: No History; Caffeine Use: Rarely; Financial Concerns: No; Food, Clothing or Shelter Needs: No; Support System Lacking: No; Transportation Concerns: No Engineer, maintenance) Signed: 10/21/2022 12:22:58 PM By: Fredirick Maudlin MD FACS Entered By: Fredirick Maudlin on 10/21/2022 12:16:16 -------------------------------------------------------------------------------- Advanced Modalities Screening Tool Details Patient Name: Date of Service: [name], [name] 10/21/2022 10:45 A M Medical Record Number: [id number] Patient Account Number: [id number] Date of Birth/Sex: Treating RN: 19-Feb-1956 (67 y.o. [name] Primary Care Provider: Riki Sheer Other Clinician: Referring Provider: Treating Provider/Extender: Louann Sjogren in Treatment: 7 T Contact Cast Applied for Wound Assessment: otal Wound #2 Right,Plantar Foot Performed By: Physician Fredirick Maudlin, MD Post Procedure Diagnosis Same as Pre-procedure Notes scribed for Dr. Celine Ahr by Adline Peals, RN Electronic Signature(s) Signed: 10/21/2022 12:22:58 PM By: Fredirick Maudlin MD FACS Signed: 10/21/2022 4:18:38 PM By: Adline Peals Entered By: Adline Peals on 10/21/2022 10:42:26 -------------------------------------------------------------------------------- [place] Details Patient Name: Date of Service: [name], [name] [place] A. 10/21/2022 Medical Record Number: [id number] Patient Account Number: [id number] Date of Birth/Sex: Treating RN: Jun 13, 1956 (67 y.o. M) Primary Care Provider: Riki Sheer Other Clinician: Referring Provider: Treating Provider/Extender: Louann Sjogren in Treatment: 7 Diagnosis Coding ICD-10 Codes Code Description [phone] Non-pressure chronic ulcer of other part of right foot with fat layer exposed I73.9 Peripheral vascular disease, unspecified I25.10 Atherosclerotic heart  disease of native coronary artery without angina pectoris I10 Essential (primary) hypertension E11.65 Type 2 diabetes mellitus with hyperglycemia E11.40 Type 2 diabetes mellitus with diabetic neuropathy, unspecified I63.9 Cerebral infarction, unspecified Bonny, Kratos A ([id number] Page 9 of 9 Facility Procedures : CPT4 Code: [id number] Description: [phone] - APPLY TOTAL CONTACT LEG CAST ICD-10 Diagnosis Description I73.9 Peripheral vascular disease, unspecified Modifier: Quantity: 1 Physician Procedures : CPT4 Code Description Modifier [id number] - WC PHYS LEVEL 3 - EST PT ICD-10 Diagnosis Description L97.512 Non-pressure chronic ulcer of other part of right foot with fat layer exposed E11.40 Type 2 diabetes mellitus with diabetic neuropathy,  unspecified I73.9 Peripheral vascular disease, unspecified E11.65 Type 2 diabetes mellitus with hyperglycemia Quantity: 1 : [id number] - WC PHYS APPLY TOTAL CONTACT CAST ICD-10 Diagnosis Description I73.9 Peripheral vascular disease, unspecified Quantity:  1 Electronic Signature(s) Signed: 10/21/2022 12:20:00 PM By: Fredirick Maudlin MD FACS Entered By: Fredirick Maudlin on 10/21/2022 12:19:59

## 2022-10-23 ENCOUNTER — Encounter (HOSPITAL_BASED_OUTPATIENT_CLINIC_OR_DEPARTMENT_OTHER): Payer: Medicare HMO | Attending: General Surgery | Admitting: General Surgery

## 2022-10-23 DIAGNOSIS — E1151 Type 2 diabetes mellitus with diabetic peripheral angiopathy without gangrene: Secondary | ICD-10-CM | POA: Diagnosis not present

## 2022-10-23 DIAGNOSIS — E1140 Type 2 diabetes mellitus with diabetic neuropathy, unspecified: Secondary | ICD-10-CM | POA: Diagnosis not present

## 2022-10-23 DIAGNOSIS — L97512 Non-pressure chronic ulcer of other part of right foot with fat layer exposed: Secondary | ICD-10-CM | POA: Insufficient documentation

## 2022-10-23 DIAGNOSIS — I872 Venous insufficiency (chronic) (peripheral): Secondary | ICD-10-CM | POA: Diagnosis not present

## 2022-10-23 DIAGNOSIS — Z8673 Personal history of transient ischemic attack (TIA), and cerebral infarction without residual deficits: Secondary | ICD-10-CM | POA: Diagnosis not present

## 2022-10-23 DIAGNOSIS — E1165 Type 2 diabetes mellitus with hyperglycemia: Secondary | ICD-10-CM | POA: Diagnosis not present

## 2022-10-23 DIAGNOSIS — E11621 Type 2 diabetes mellitus with foot ulcer: Secondary | ICD-10-CM | POA: Insufficient documentation

## 2022-10-23 DIAGNOSIS — I2510 Atherosclerotic heart disease of native coronary artery without angina pectoris: Secondary | ICD-10-CM | POA: Insufficient documentation

## 2022-10-23 NOTE — Progress Notes (Signed)
Lufkin, Filbert [name] ([id number]) [id number] Page 1 of 6 Visit Report for 10/23/2022 Arrival Information Details Patient Name: Date of Service: [name], [name]. 10/23/2022 8:45 [name] Medical Record Number: [id number] Patient Account Number: [id number] Date of Birth/Sex: Treating RN: 07/06/1956 (67 y.o. [name]) Primary Care Morgen Linebaugh: Arva ChafeWendling, Nicholas Other Clinician: Referring Deborra Phegley: Treating Maesyn Frisinger/Extender: Vivien Rossettiannon, Jennifer Wendling, Nicholas Weeks in Treatment: 7 Visit Information History Since Last Visit All ordered tests and consults were completed: No Patient Arrived: Ambulatory Added or deleted any medications: No Arrival Time: 08:51 Any new allergies or adverse reactions: No Accompanied By: wife Had [name] fall or experienced change in No Transfer Assistance: None activities of daily living that may affect Patient Identification Verified: Yes risk of falls: Secondary Verification Process Completed: Yes Signs or symptoms of abuse/neglect since last visito No Hospitalized since last visit: No Implantable device outside of the clinic excluding No cellular tissue based products placed in the center since last visit: Pain Present Now: No Electronic Signature(s) Signed: 10/23/2022 10:08:28 AM By: [place] Scrapeoss, Aisha Entered By: [place] Scrapeoss, Aisha on 10/23/2022 08:52:17 -------------------------------------------------------------------------------- Encounter Discharge Information Details Patient Name: Date of Service: [name], [name]. 10/23/2022 8:45 [name] Medical Record Number: [id number] Patient Account Number: [id number] Date of Birth/Sex: Treating RN: 02/03/1956 [age] y.o. [name]) Herrington, Taylor Primary Care Leahanna Buser: Arva ChafeWendling, Nicholas Other Clinician: Referring Rickelle Sylvestre: Treating Temesha Queener/Extender: Vivien Rossettiannon, Jennifer Wendling, Nicholas Weeks in Treatment: 7 Encounter Discharge Information Items Discharge Condition: Stable Ambulatory Status: Ambulatory Discharge Destination:  Home Transportation: Private Auto Accompanied By: wife Schedule Follow-up Appointment: Yes Clinical Summary of Care: Patient Declined Electronic Signature(s) Signed: 10/23/2022 3:21:47 PM By: Samuella BruinHerrington, Taylor Entered By: Samuella BruinHerrington, Taylor on 10/23/2022 09:12:55 -------------------------------------------------------------------------------- Lower Extremity Assessment Details Patient Name: Date of Service: [name] PianPEGG, [name]. 10/23/2022 8:45 [name] Medical Record Number: [id number] Patient Account Number: [id number] Date of Birth/Sex: Treating RN: 02/21/1956 (67 y.o. [name]) Herrington, Taylor Primary Care Terrill Alperin: Arva ChafeWendling, Nicholas Other Clinician: Referring Jermayne Sweeney: Treating Terrionna Bridwell/Extender: Vivien Rossettiannon, Jennifer Wendling, Nicholas Weeks in Treatment: 7 Edema Assessment Assessed: [Left: No] [Right: No] P[Left: EGG, [name] ([id number])]([id number])] [Right: [id number] Page 2 of 6] [Left: Edema] [Right: :] Calf Left: Right: Point of Measurement: From Medial Instep 43 cm Ankle Left: Right: Point of Measurement: From Medial Instep 28.1 cm Electronic Signature(s) Signed: 10/23/2022 3:21:47 PM By: Samuella BruinHerrington, Taylor Entered By: Samuella BruinHerrington, Taylor on 10/23/2022 09:06:52 -------------------------------------------------------------------------------- Multi Wound Chart Details Patient Name: Date of Service: [name] PianPEGG, [name]. 10/23/2022 8:45 [name] Medical Record Number: [id number] Patient Account Number: [id number] Date of Birth/Sex: Treating RN: 03/07/1956 [age] y.o. [name]) Primary Care Delecia Vastine: Arva ChafeWendling, Nicholas Other Clinician: Referring Danford Tat: Treating Genasis Zingale/Extender: Vivien Rossettiannon, Jennifer Wendling, Nicholas Weeks in Treatment: 7 Vital Signs Height(in): 70 Pulse(bpm): 59 Weight(lbs): 260 Blood Pressure(mmHg): 182/74 Body Mass Index(BMI): 37.3 Temperature(F): 98.2 Respiratory Rate(breaths/min): 18 [2:Photos: No Photos Right, Plantar Foot Wound Location: Gradually Appeared Wounding  Event: Diabetic Wound/Ulcer of the Lower Primary Etiology: Extremity Sleep Apnea, Arrhythmia, Coronary [name] Comorbid History: Artery Disease, Deep Vein Thrombosis, Hypertension,  Myocardial Infarction, Peripheral Arterial Disease, Type II Diabetes, Neuropathy 07/08/2022 Date Acquired: 7 Weeks of Treatment: Open Wound Status: No Wound Recurrence: 0.6x3.5x0.1 Measurements L x W x D (cm) 1.649 [name] : rea 0.165 Volume (cm) :  35.40% % Reduction in [name] rea: 35.30% % Reduction in Volume: Grade 1 Classification: Medium Exudate [name] mount: Serosanguineous Exudate Type: red, brown Exudate Color: Distinct, outline attached Wound Margin: Large (67-100%) Granulation [name] mount: Red  Granulation Quality: Small (1-33%) Necrotic [name] mount: Fat Layer (Subcutaneous Tissue): Yes  [name] Exposed Structures: Fascia: No Tendon: No Muscle: No Joint: No Bone: No Medium (34-66%) Epithelialization: Callus: Yes Periwound Skin Texture: No Abnormalities  Noted Periwound Skin Moisture: No Abnormalities Noted Periwound Skin Color: No Abnormality Temperature: T Contact Cast otal Procedures Performed:] [[name]:[name] [name] [name] [name] [name] [name] [name] [name] [name] [name] [name] [name] [name] [name] [name] [name] [name] [name] [name] [name] [name] [name] [name] [name] [name]  [name] [name]] Treatment Notes [name], [name] ([id number]) [id number] Page 3 of 6 Wound #2 (Foot) Wound Laterality: Plantar, Right Cleanser Soap and Water Discharge Instruction: May shower and wash wound with dial antibacterial soap and water prior to dressing change. Wound Cleanser Discharge Instruction: Cleanse the wound with wound cleanser prior to applying [name] clean dressing using gauze sponges, not tissue or cotton balls. Peri-Wound Care Topical Primary Dressing Hydrofera Blue Ready Transfer Foam, 2.5x2.5 (in/in) Discharge Instruction: Apply directly to wound bed as directed Secondary Dressing Woven Gauze Sponge, Non-Sterile 4x4 in Discharge Instruction: Apply over primary dressing as directed. Zetuvit Plus 4x8  in Discharge Instruction: Apply over primary dressing as directed. Secured With [employer], 4.5x3.1 (in/yd) Discharge Instruction: Secure with Kerlix as directed. Compression Wrap Compression Stockings Add-Ons Electronic Signature(s) Signed: 10/23/2022 9:26:14 AM By: Duanne Guess MD FACS Entered By: Duanne Guess on 10/23/2022 09:26:13 -------------------------------------------------------------------------------- Multi-Disciplinary Care Plan Details Patient Name: Date of Service: [name] Pian, [name]. 10/23/2022 8:45 [name] Medical Record Number: [id number] Patient Account Number: [id number] Date of Birth/Sex: Treating RN: [birth year] (67 y.o. [name] Primary Care Keasia Dubose: Arva Chafe Other Clinician: Referring Charleene Callegari: Treating Vianka Ertel/Extender: [name] in Treatment: 7 Active Inactive Abuse / Safety / Falls / Self Care Management Nursing Diagnoses: Impaired physical mobility Potential for falls Goals: Patient will not experience any injury related to falls Date Initiated: 08/31/2022 Target Resolution Date: 12/18/2022 Goal Status: Active Patient/caregiver will verbalize/demonstrate measures taken to improve the patient's personal safety Date Initiated: 08/31/2022 Target Resolution Date: 01/15/2023 Goal Status: Active Interventions: Provide education on basic hygiene Provide education on fall prevention Notes: Wound/Skin Impairment [name], [name] ([id number]) [id number] Page 4 of 6 Nursing Diagnoses: Impaired tissue integrity Knowledge deficit related to ulceration/compromised skin integrity Goals: Patient/caregiver will verbalize understanding of skin care regimen Date Initiated: 08/31/2022 Target Resolution Date: 12/19/2022 Goal Status: Active Interventions: Assess patient/caregiver ability to obtain necessary supplies Assess patient/caregiver ability to perform ulcer/skin care regimen  upon admission and as needed Assess ulceration(s) every visit Treatment Activities: Skin care regimen initiated : 08/31/2022 Topical wound management initiated : 08/31/2022 Notes: Electronic Signature(s) Signed: 10/23/2022 3:21:47 PM By: Samuella Bruin Entered By: Samuella Bruin on 10/23/2022 09:12:23 -------------------------------------------------------------------------------- Pain Assessment Details Patient Name: Date of Service: [name] Pian, [name]. 10/23/2022 8:45 [name] Medical Record Number: [id number] Patient Account Number: [id number] Date of Birth/Sex: Treating RN: 10-11-1955 (67 y.o. [name]) Primary Care Heman Que: Arva Chafe Other Clinician: Referring Evarose Altland: Treating Kanan Sobek/Extender: [name] in Treatment: 7 Active Problems Location of Pain Severity and Description of Pain Patient Has Paino No Site Locations Pain Management and Medication Current Pain Management: Electronic Signature(s) Signed: 10/23/2022 10:08:28 AM By: Dayton Scrape Entered By: Dayton Scrape on 10/23/2022 08:52:45 -------------------------------------------------------------------------------- Patient/Caregiver Education Details Patient Name: Date of Service: [name] 4/5/2024andnbsp8:45 [name] Medical Record Number: [id number] Patient Account Number: [id number] [name], [name] ([id number]) [id number] Page 5 of 6 Date of Birth/Gender: Treating RN: 18-Dec-1955 (67 y.o. [name] Primary Care Physician: Arva Chafe Other Clinician: Referring Physician: Treating Physician/Extender: [name]  in Treatment: 7 Education Assessment Education Provided To: Patient Education Topics Provided Safety: Methods: Explain/Verbal Responses: Reinforcements needed, State content correctly Electronic Signature(s) Signed: 10/23/2022 3:21:47 PM By: Samuella BruinHerrington, Taylor Entered By: Samuella BruinHerrington, Taylor on  10/23/2022 09:12:34 -------------------------------------------------------------------------------- Wound Assessment Details Patient Name: Date of Service: [name] PianPEGG, [name]. 10/23/2022 8:45 [name] Medical Record Number: [id number] Patient Account Number: [id number] Date of Birth/Sex: Treating RN: 08/26/1955 [age] y.o. [name]) Herrington, Taylor Primary Care Feleica Fulmore: Arva ChafeWendling, Nicholas Other Clinician: Referring Verle Wheeling: Treating Endrit Gittins/Extender: Vivien Rossettiannon, Jennifer Wendling, Nicholas Weeks in Treatment: 7 Wound Status Wound Number: 2 Primary Diabetic Wound/Ulcer of the Lower Extremity Etiology: Wound Location: Right, Plantar Foot Wound Open Wounding Event: Gradually Appeared Status: Date Acquired: 07/08/2022 Comorbid Sleep Apnea, Arrhythmia, Coronary Artery Disease, Deep Vein Weeks Of Treatment: 7 History: Thrombosis, Hypertension, Myocardial Infarction, Peripheral Arterial Clustered Wound: No Disease, Type II Diabetes, Neuropathy Wound Measurements Length: (cm) 0.6 Width: (cm) 3.5 Depth: (cm) 0.1 Area: (cm) 1.649 Volume: (cm) 0.165 % Reduction in Area: 35.4% % Reduction in Volume: 35.3% Epithelialization: Medium (34-66%) Wound Description Classification: Grade 1 Wound Margin: Distinct, outline attached Exudate Amount: Medium Exudate Type: Serosanguineous Exudate Color: red, brown Foul Odor After Cleansing: No Slough/Fibrino Yes Wound Bed Granulation Amount: Large (67-100%) Exposed Structure Granulation Quality: Red Fascia Exposed: No Necrotic Amount: Small (1-33%) Fat Layer (Subcutaneous Tissue) Exposed: Yes Necrotic Quality: Adherent Slough Tendon Exposed: No Muscle Exposed: No Joint Exposed: No Bone Exposed: No Periwound Skin Texture Texture Color No Abnormalities Noted: No No Abnormalities Noted: Yes Callus: Yes Temperature / Pain Temperature: No Abnormality Moisture No Abnormalities Noted: Yes [name], [name] ([id number]) [id number] Page 6  of 6 Treatment Notes Wound #2 (Foot) Wound Laterality: Plantar, Right Cleanser Soap and Water Discharge Instruction: May shower and wash wound with dial antibacterial soap and water prior to dressing change. Wound Cleanser Discharge Instruction: Cleanse the wound with wound cleanser prior to applying [name] clean dressing using gauze sponges, not tissue or cotton balls. Peri-Wound Care Topical Primary Dressing Hydrofera Blue Ready Transfer Foam, 2.5x2.5 (in/in) Discharge Instruction: Apply directly to wound bed as directed Secondary Dressing Woven Gauze Sponge, Non-Sterile 4x4 in Discharge Instruction: Apply over primary dressing as directed. Zetuvit Plus 4x8 in Discharge Instruction: Apply over primary dressing as directed. Secured With [employer] Roll Sterile, 4.5x3.1 (in/yd) Discharge Instruction: Secure with Kerlix as directed. Compression Wrap Compression Stockings Add-Ons Electronic Signature(s) Signed: 10/23/2022 3:21:47 PM By: Samuella BruinHerrington, Taylor Entered By: Samuella BruinHerrington, Taylor on 10/23/2022 09:07:02 -------------------------------------------------------------------------------- Vitals Details Patient Name: Date of Service: [name] PianPEGG, [name]. 10/23/2022 8:45 [name] Medical Record Number: [id number] Patient Account Number: [id number] Date of Birth/Sex: Treating RN: 04/09/1956 [age] y.o. [name]) Primary Care Nevaeh Casillas: Arva ChafeWendling, Nicholas Other Clinician: Referring Deina Lipsey: Treating Kenn Rekowski/Extender: Vivien Rossettiannon, Jennifer Wendling, Nicholas Weeks in Treatment: 7 Vital Signs Time Taken: 08:52 Temperature (F): 98.2 Height (in): 70 Pulse (bpm): 59 Weight (lbs): 260 Respiratory Rate (breaths/min): 18 Body Mass Index (BMI): 37.3 Blood Pressure (mmHg): 182/74 Reference Range: 80 - 120 mg / dl Electronic Signature(s) Signed: 10/23/2022 10:08:28 AM By: [place] Scrapeoss, Aisha Entered By: Dayton Scrapeoss, Aisha on 10/23/2022 08:52:39

## 2022-10-23 NOTE — Progress Notes (Signed)
ALEX, LEAHY A ([id number]) [id number] Page 1 of 9 Visit Report for 10/23/2022 Chief Complaint Document Details Patient Name: Date of Service: [name], [name] 10/23/2022 8:45 A M Medical Record Number: [id number] Patient Account Number: [id number] Date of Birth/Sex: Treating [name]: 27-Jul-1955 (67 y.o. M) Primary Care Provider: Arva Chafe Other Clinician: Referring Provider: Treating Provider/Extender: [name] in Treatment: 7 Information Obtained from: Patient Chief Complaint 05/14/2020; patient is here for review of an abrasion injury on the left lateral calf 08/31/2022: [name] (1st metatarsal base, plantar) Electronic Signature(s) Signed: 10/23/2022 9:34:37 AM By: Duanne Guess MD FACS Entered By: Duanne Guess on 10/23/2022 09:34:37 -------------------------------------------------------------------------------- HPI Details Patient Name: Date of Service: [name], [name] 10/23/2022 8:45 A M Medical Record Number: [id number] Patient Account Number: [id number] Date of Birth/Sex: Treating [name]: 19-Jul-1956 (67 y.o. M) Primary Care Provider: Arva Chafe Other Clinician: Referring Provider: Treating Provider/Extender: [name] in Treatment: 7 History of Present Illness HPI Description: ADMISSION 05/14/2020; this is a 67 year old man with multiple medical issues. Predominantly he has type 2 diabetes with a history of peripheral neuropathy and also history of fairly significant PAD. He had a left superficial femoral to posterior tibial artery bypass in February 2017 he also had an atherectomy and angioplasty by Dr. [name] of the right popliteal artery in 2016. He is supposed to be getting arterial studies annually however this was interrupted last year because of the pandemic. He tells [place] he was at [facility] 2 weeks ago was getting out of of the scooter and traumatized his left lateral  lower leg. There was a lot of bleeding as the patient is on Plavix and Eliquis. They have been dressing this with Neosporin and doing a fairly good job. Wound measures 2.5 x 3.5 it does not have any depth he does not have a wound history in his legs outside of surgery however he does have chronic edema and skin changes suggestive of chronic venous disease possibly some degree of lymphedema as well. Past medical history includes type 2 diabetes with peripheral neuropathy and gait instability, lumbar spondylosis, obstructive sleep apnea, history of a left pontine CVA, basal cell skin cancer, atrial fibrillation on anticoagulation, significant PAD as noted with a left superficial femoral to posterior tibial bypass in February 2017 and a right popliteal atherectomy and angioplasty by Dr. [name] in 30th 2016. He also has a history of coronary artery disease with an MI in 2002 hypertension hyperlipidemia and heart failure with preserved ejection fraction His last arterial studies I can see in epic were on 03/10/2018 this showed a right ABI of 0.69 and a right TBI of 0.5 with monophasic waveforms on the right. On the left his ABI was 1.20 with a TBI of 0.92 and triphasic waveforms. He has not had arterial studies since. Our nurse in the clinic got an ABI on the left of 1.1 11/2; left anterior leg wound in the setting of chronic venous insufficiency. Wound was initially trauma. We have been using Hydrofera Blue under compression he has home health.. The wound looks a lot better today with improvement in surface area 11/16; left anterior leg wound in the setting of chronic venous insufficiency. Wound was initially trauma. We have been using Hydrofera Blue under compression. The patient is closed today. He is supposed to follow-up with vein and vascular with regards to arterial insufficiency nevertheless his leg wounds are 12/3; apparently 2 weeks ago when they were putting on their stockings they managed  to get  3 wounds on the left anterior lower leg from abrasion when putting on the stockings. Home health came by the week of Thanksgiving and put Hydrofera Blue 4-layer wrap on this and there is only one superficial area remaining. The patient and his wife complained about the difficulties getting stockings on I think we are using 20/30. We will order bilateral external compression stockings which should be easier. 12/10; wound on the left anterior lower leg is closed. He has chronic venous insufficiency we ordered him Farrow wrap stockings unfortunately he did not bring these in. 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 He says that it has been present since mid December. He is currently residing in [facility] until March and a podiatrist has been looking after him there. They have been simply painting the area with Betadine. He has not had any lower extremity arterial studies since 2019, at which time his right ABI was 0.69. Measured in clinic today, it was 0.71. He is not aware of his most recent hemoglobin A1c, [name] historically he has had exceptionally poor control. On the basis of his right first metatarsal, there is a small crescent shaped wound. There is surrounding eschar and callus. There is no malodor or purulent Renzulli, Christ A ([id number]) [phone].pdf Page 2 of 9 drainage. 09/08/2022: The original wound is smaller today and fairly clean, [name] there is some discoloration and a pulpy texture to the adjacent callus. Underneath this, the tissue is open exposing the fat layer. It looks like perhaps there was a crack in the callus and moisture got under the skin and caused breakdown. 09/15/2022: There has been more moisture related tissue breakdown. The callus is very soft and the underlying tissue is more open. 09/28/2022: The wound looks much better. He has done a good job keeping it dry. There is some callus  overlying much of the wound surface. There is slough on the exposed open areas. 10/14/2022: There has been a lot of moisture related tissue breakdown. He is still forming callus over the top and then it seems that moisture gets underneath the callus and causes tissue damage. There is slough on the exposed wound surface. They will be moving back to the [place] area [place] the [place] this weekend. 10/21/2022: His foot is less wet, [name] there is no significant change to his wound. He has developed a blister on his left anterior tibial surface. There is no open wound here, [name] he does have some fairly significant edema. We are planning to apply a total contact cast today. 10/23/2022: Here for his obligatory first cast change. He says he has not had any issues wearing the cast or walking in the boot. No detrimental effects on his wound. Electronic Signature(s) Signed: 10/23/2022 9:35:13 AM By: Duanne Guess MD FACS Entered By: Duanne Guess on 10/23/2022 09:35:13 -------------------------------------------------------------------------------- Physical Exam Details Patient Name: Date of Service: [name], [name] 10/23/2022 8:45 A M Medical Record Number: [id number] Patient Account Number: [id number] Date of Birth/Sex: Treating [name]: 1956/07/09 (67 y.o. M) Primary Care Provider: Arva Chafe Other Clinician: Referring Provider: Treating Provider/Extender: [name] in Treatment: 7 Constitutional Hypertensive, asymptomatic. Slightly bradycardic. . . no acute distress. Respiratory Normal work of breathing on room air. Notes 10/23/2022: Wound is stable. Electronic Signature(s) Signed: 10/23/2022 9:36:19 AM By: Duanne Guess MD FACS Entered By: Duanne Guess on 10/23/2022 09:36:19 -------------------------------------------------------------------------------- Physician Orders Details Patient Name: Date of Service:  Mimbs, [name] 10/23/2022 8:45 A M Medical Record  Number: [id number] Patient Account Number: [id number] Date of Birth/Sex: Treating [name]: 05-08-1956 (67 y.o. [name] Primary Care Provider: Arva Chafe Other Clinician: Referring Provider: Treating Provider/Extender: [name] in Treatment: 7 Verbal / Phone Orders: No Diagnosis Coding ICD-10 Coding Code Description [phone] Non-pressure chronic ulcer of other part of right foot with fat layer exposed I73.9 Peripheral vascular disease, unspecified I25.10 Atherosclerotic heart disease of native coronary artery without angina pectoris I10 Essential (primary) hypertension E11.65 Type 2 diabetes mellitus with hyperglycemia E11.40 Type 2 diabetes mellitus with diabetic neuropathy, unspecified I63.9 Cerebral infarction, unspecified Follow-up Appointments Wearing, Chidiebere A ([id number]) [id number] Page 3 of 9 ppointment in 1 week. - Dr. Lady Gary - room 2 Return A Anesthetic (In clinic) Topical Lidocaine 4% applied to wound bed Bathing/ Shower/ Hygiene May shower and wash wound with soap and water. Edema Control - Lymphedema / SCD / Other Elevate legs to the level of the heart or above for 30 minutes daily and/or when sitting for 3-4 times a day throughout the day. Avoid standing for long periods of time. Patient to wear own compression stockings every day. Moisturize legs daily. Off-Loading Total Contact Cast to Right Lower Extremity Removable cast walker boot to: - right leg Other: - forefront offloading shoe Non Wound Condition Left Lower Extremity Other Non Wound Condition Orders/Instructions: - 3 layer wrap with silver alginate over blister Wound Treatment Wound #2 - Foot Wound Laterality: Plantar, Right Cleanser: Soap and Water 1 x Per Week/30 Days Discharge Instructions: May shower and wash wound with dial antibacterial soap and water prior to dressing change. Cleanser: Wound Cleanser 1 x Per Week/30  Days Discharge Instructions: Cleanse the wound with wound cleanser prior to applying a clean dressing using gauze sponges, not tissue or cotton balls. Prim Dressing: Hydrofera Blue Ready Transfer Foam, 2.5x2.5 (in/in) 1 x Per Week/30 Days ary Discharge Instructions: Apply directly to wound bed as directed Secondary Dressing: Woven Gauze Sponge, Non-Sterile 4x4 in 1 x Per Week/30 Days Discharge Instructions: Apply over primary dressing as directed. Secondary Dressing: Zetuvit Plus 4x8 in 1 x Per Week/30 Days Discharge Instructions: Apply over primary dressing as directed. Secured With: [employer], 4.5x3.1 (in/yd) 1 x Per Week/30 Days Discharge Instructions: Secure with Kerlix as directed. Electronic Signature(s) Signed: 10/23/2022 12:19:37 PM By: Duanne Guess MD FACS Entered By: Duanne Guess on 10/23/2022 09:36:28 -------------------------------------------------------------------------------- Problem List Details Patient Name: Date of Service: [name], [name] 10/23/2022 8:45 A M Medical Record Number: [id number] Patient Account Number: [id number] Date of Birth/Sex: Treating [name]: [birth year] (67 y.o. M) Primary Care Provider: Arva Chafe Other Clinician: Referring Provider: Treating Provider/Extender: [name] in Treatment: 7 Active Problems ICD-10 Encounter Code Description Active Date MDM Diagnosis L97.512 Non-pressure chronic ulcer of other part of right foot with fat layer exposed 08/31/2022 No Yes I73.9 Peripheral vascular disease, unspecified 08/31/2022 No Yes I25.10 Atherosclerotic heart disease of native coronary artery without angina pectoris 08/31/2022 No Yes Footman, Carles A ([id number]) [id number] Page 4 of 9 I10 Essential (primary) hypertension 08/31/2022 No Yes E11.65 Type 2 diabetes mellitus with hyperglycemia 08/31/2022 No Yes E11.40 Type 2 diabetes mellitus with diabetic neuropathy, unspecified  08/31/2022 No Yes I63.9 Cerebral infarction, unspecified 08/31/2022 No Yes Inactive Problems Resolved Problems Electronic Signature(s) Signed: 10/23/2022 9:26:08 AM By: Duanne Guess MD FACS Entered By: Duanne Guess on 10/23/2022 09:26:08 -------------------------------------------------------------------------------- Progress Note Details Patient Name: Date of Service: Kahrs, [name]  10/23/2022 8:45 A M Medical Record Number: [id number] Patient Account Number: [id number] Date of Birth/Sex: Treating [name]: 01/23/1956 (67 y.o. M) Primary Care Provider: Arva ChafeWendling, Nicholas Other Clinician: Referring Provider: Treating Provider/Extender: Vivien Rossettiannon, Eileen Kangas Wendling, Nicholas Weeks in Treatment: 7 Subjective Chief Complaint Information obtained from Patient 05/14/2020; patient is here for review of an abrasion injury on the left lateral calf 08/31/2022: [name] (1st metatarsal base, plantar) History of Present Illness (HPI) ADMISSION 05/14/2020; this is a 67 year old man with multiple medical issues. Predominantly he has type 2 diabetes with a history of peripheral neuropathy and also history of fairly significant PAD. He had a left superficial femoral to posterior tibial artery bypass in February 2017 he also had an atherectomy and angioplasty by Dr. Allyson SabalBerry of the right popliteal artery in 2016. He is supposed to be getting arterial studies annually however this was interrupted last year because of the pandemic. [facility] was getting out of of the scooter and traumatized his left lateral lower leg. There was a lot of bleeding as the patient is on Plavix and Eliquis. They have been dressing this with Neosporin and doing a fairly good job. Wound measures 2.5 x 3.5 it does not have any depth he does not have a wound history in his legs outside of surgery however he does have chronic edema and skin changes suggestive of chronic venous disease possibly some  degree of lymphedema as well. Past medical history includes type 2 diabetes with peripheral neuropathy and gait instability, lumbar spondylosis, obstructive sleep apnea, history of a left pontine CVA, basal cell skin cancer, atrial fibrillation on anticoagulation, significant PAD as noted with a left superficial femoral to posterior tibial bypass in February 2017 and a right popliteal atherectomy and angioplasty by Dr. Allyson SabalBerry in 30th 2016. He also has a history of coronary artery disease with an MI in 2002 hypertension hyperlipidemia and heart failure with preserved ejection fraction His last arterial studies I can see in epic were on 03/10/2018 this showed a right ABI of 0.69 and a right TBI of 0.5 with monophasic waveforms on the right. On the left his ABI was 1.20 with a TBI of 0.92 and triphasic waveforms. He has not had arterial studies since. Our nurse in the clinic got an ABI on the left of 1.1 11/2; left anterior leg wound in the setting of chronic venous insufficiency. Wound was initially trauma. We have been using Hydrofera Blue under compression he has home health.. The wound looks a lot better today with improvement in surface area 11/16; left anterior leg wound in the setting of chronic venous insufficiency. Wound was initially trauma. We have been using Hydrofera Blue under compression. The patient is closed today. He is supposed to follow-up with vein and vascular with regards to arterial insufficiency nevertheless his leg wounds are 12/3; apparently 2 weeks ago when they were putting on their stockings they managed to get 3 wounds on the left anterior lower leg from abrasion when putting on the stockings. Home health came by the week of Thanksgiving and put Hydrofera Blue 4-layer wrap on this and there is only one superficial area remaining. The patient and his wife complained about the difficulties getting stockings on I think we are using 20/30. We will order bilateral external  compression stockings which should be easier. 12/10; wound on the left anterior lower leg is closed. He has chronic venous insufficiency we ordered him Farrow wrap stockings unfortunately he did not  bring these in. 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 He says that it has been present since mid December. He is currently Ballard, Miachel A ([id number]) [id number] Page 5 of 9 residing in [place] until March and a podiatrist has been looking after him there. They have been simply painting the area with Betadine. He has not had any lower extremity arterial studies since 2019, at which time his right ABI was 0.69. Measured in clinic today, it was 0.71. He is not aware of his most recent hemoglobin A1c, [name] historically he has had exceptionally poor control. On the basis of his right first metatarsal, there is a small crescent shaped wound. There is surrounding eschar and callus. There is no malodor or purulent drainage. 09/08/2022: The original wound is smaller today and fairly clean, [name] there is some discoloration and a pulpy texture to the adjacent callus. Underneath this, the tissue is open exposing the fat layer. It looks like perhaps there was a crack in the callus and moisture got under the skin and caused breakdown. 09/15/2022: There has been more moisture related tissue breakdown. The callus is very soft and the underlying tissue is more open. 09/28/2022: The wound looks much better. He has done a good job keeping it dry. There is some callus overlying much of the wound surface. There is slough on the exposed open areas. 10/14/2022: There has been a lot of moisture related tissue breakdown. He is still forming callus over the top and then it seems that moisture gets underneath the callus and causes tissue damage. There is slough on the exposed wound surface. They will be moving back to the local area  from the beach this weekend. 10/21/2022: His foot is less wet, [name] there is no significant change to his wound. He has developed a blister on his left anterior tibial surface. There is no open wound here, [name] he does have some fairly significant edema. We are planning to apply a total contact cast today. 10/23/2022: Here for his obligatory first cast change. He says he has not had any issues wearing the cast or walking in the boot. No detrimental effects on his wound. Patient History Information obtained from Patient. Family History Unknown History. Social History Never smoker, Marital Status - Single, Alcohol Use - Never, Drug Use - No History, Caffeine Use - Rarely. Medical History Respiratory Patient has history of Sleep Apnea Cardiovascular Patient has history of Arrhythmia - A-Fib, Coronary Artery Disease - s/p CABG, Deep Vein Thrombosis, Hypertension, Myocardial Infarction, Peripheral Arterial Disease - s/p Fempop x 2 Endocrine Patient has history of Type II Diabetes Neurologic Patient has history of Neuropathy Hospitalization/Surgery History - colonoscopy. - polypectomy. - peripheral vascular cath. - shoulder arthroscopy. - carpal tunnel release. - coronary artery bypass. - appendectomy. - cardiac cath. - coronary angioplasty. - thrombectomy. - knee arthroplasty. - popliteal artery stent. 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 Slightly bradycardic. no acute distress. Vitals Time Taken: 8:52 AM, Height: 70 in, Weight: 260 lbs, BMI: 37.3, Temperature: 98.2 F, Pulse: 59 bpm, Respiratory Rate: 18 breaths/min, Blood Pressure: 182/74 mmHg. Respiratory Normal work of breathing on room air. General Notes: 10/23/2022: Wound is stable. Integumentary (Hair, Skin) Wound #2 status is Open. Original cause of wound was Gradually  Appeared. The date acquired was: 07/08/2022. The wound has been in treatment 7  weeks. The wound is located on the Right,Plantar Foot. The wound measures 0.6cm length x 3.5cm width x 0.1cm depth; 1.649cm^2 area and 0.165cm^3 volume. There is Fat Layer (Subcutaneous Tissue) exposed. There is a medium amount of serosanguineous drainage noted. The wound margin is distinct with the outline attached to the wound base. There is large (67-100%) red granulation within the wound bed. There is a small (1-33%) amount of necrotic tissue within the wound bed including Adherent Slough. The periwound skin appearance had no abnormalities noted for moisture. The periwound skin appearance had no abnormalities noted for color. The periwound skin appearance exhibited: Callus. Periwound temperature was noted as No Abnormality. KRZYSZTOF, REICHELT A ([id number]) [id number] Page 6 of 9 Assessment Active Problems ICD-10 Non-pressure chronic ulcer of other part of right foot with fat layer exposed Peripheral vascular disease, unspecified Atherosclerotic heart disease of native coronary artery without angina pectoris Essential (primary) hypertension Type 2 diabetes mellitus with hyperglycemia Type 2 diabetes mellitus with diabetic neuropathy, unspecified Cerebral infarction, unspecified Procedures Wound #2 Pre-procedure diagnosis of Wound #2 is a Diabetic Wound/Ulcer of the Lower Extremity located on the Right,Plantar Foot . There was a T Contact Cast otal Procedure by Duanne Guess, MD. Post procedure Diagnosis Wound #2: Same as Pre-Procedure Notes: scribed for Dr. Lady Gary by Samuella Bruin, [name]. Plan Follow-up Appointments: Return Appointment in 1 week. - Dr. Lady Gary - room 2 Anesthetic: (In clinic) Topical Lidocaine 4% applied to wound bed Bathing/ Shower/ Hygiene: May shower and wash wound with soap and water. Edema Control - Lymphedema / SCD / Other: Elevate legs to the level of the  heart or above for 30 minutes daily and/or when sitting for 3-4 times a day throughout the day. Avoid standing for long periods of time. Patient to wear own compression stockings every day. Moisturize legs daily. Off-Loading: T Contact Cast to Right Lower Extremity otal Removable cast walker boot to: - right leg Other: - forefront offloading shoe Non Wound Condition: Other Non Wound Condition Orders/Instructions: - 3 layer wrap with silver alginate over blister WOUND #2: - Foot Wound Laterality: Plantar, Right Cleanser: Soap and Water 1 x Per Week/30 Days Discharge Instructions: May shower and wash wound with dial antibacterial soap and water prior to dressing change. Cleanser: Wound Cleanser 1 x Per Week/30 Days Discharge Instructions: Cleanse the wound with wound cleanser prior to applying a clean dressing using gauze sponges, not tissue or cotton balls. Prim Dressing: Hydrofera Blue Ready Transfer Foam, 2.5x2.5 (in/in) 1 x Per Week/30 Days ary Discharge Instructions: Apply directly to wound bed as directed Secondary Dressing: Woven Gauze Sponge, Non-Sterile 4x4 in 1 x Per Week/30 Days Discharge Instructions: Apply over primary dressing as directed. Secondary Dressing: Zetuvit Plus 4x8 in 1 x Per Week/30 Days Discharge Instructions: Apply over primary dressing as directed. Secured With: [employer], 4.5x3.1 (in/yd) 1 x Per Week/30 Days Discharge Instructions: Secure with Kerlix as directed. 10/23/2022: He is here today for his obligatory first total contact cast change. He is doing well and is tolerating both the cast and the boot. No adverse effects on his wound. A total contact cast was reapplied without difficulty or complication. He will follow-up in 1 week. Electronic Signature(s) Signed: 10/23/2022 9:37:05 AM By: Duanne Guess MD FACS Entered By: Duanne Guess on 10/23/2022 09:37:04 Apsey, Alvia A ([id number]) [id number] Page 7 of  9 -------------------------------------------------------------------------------- HxROS Details Patient Name: Date of Service: VANWART, [name] 10/23/2022 8:45 A M Medical Record Number: [id number] Patient Account Number: [id number] Date of  Birth/Sex: Treating [name]: 10/28/1955 [age] y.o. M) Primary Care Provider: Arva ChafeWendling, Nicholas Other Clinician: Referring Provider: Treating Provider/Extender: Vivien Rossettiannon, Cutter Passey Wendling, Nicholas Weeks in Treatment: 7 Information Obtained From Patient Respiratory Medical History: Positive for: Sleep Apnea Cardiovascular Medical History: Positive for: Arrhythmia - A-Fib; Coronary Artery Disease - s/p CABG; Deep Vein Thrombosis; Hypertension; Myocardial Infarction; Peripheral Arterial Disease - s/p Fempop x 2 Past Medical History Notes: CVA x 3 Endocrine Medical History: Positive for: Type II Diabetes Time with diabetes: since 1997 Treated with: Insulin, Oral agents Blood sugar tested every day: Yes Tested : 2-3x per day Musculoskeletal Medical History: Past Medical History Notes: carpal tunnel syndrome Neurologic Medical History: Positive for: Neuropathy Past Medical History Notes: stroke Oncologic Medical History: Past Medical History Notes: skin cancer Immunizations Pneumococcal Vaccine: Received Pneumococcal Vaccination: Yes Received Pneumococcal Vaccination On or After 60th Birthday: Yes Implantable Devices None Hospitalization / Surgery History Type of Hospitalization/Surgery colonoscopy polypectomy peripheral vascular cath shoulder arthroscopy carpal tunnel release coronary artery bypass appendectomy cardiac cath coronary angioplasty thrombectomy knee arthroplasty popliteal artery stent Family and Social History Coralie CarpenEGG, Liahm A ([id number]) [id number] Page 8 of 9 Unknown History: Yes; Never smoker; Marital Status - Single; Alcohol Use: Never; Drug Use: No History; Caffeine Use: Rarely; Financial  Concerns: No; Food, Clothing or Shelter Needs: No; Support System Lacking: No; Transportation Concerns: No Psychologist, prison and probation serviceslectronic Signature(s) Signed: 10/23/2022 12:19:37 PM By: Duanne Guessannon, Lynk Marti MD FACS Entered By: Duanne Guessannon, Blaine Guiffre on 10/23/2022 09:35:21 -------------------------------------------------------------------------------- Total Contact Cast Details Patient Name: Date of Service: Allyson SabalPEGG, [name] 10/23/2022 8:45 A M Medical Record Number: [id number] Patient Account Number: [id number] Date of Birth/Sex: Treating [name]: 06/18/1956 (67 y.o. Marlan PalauM) Herrington, Taylor Primary Care Provider: Arva ChafeWendling, Nicholas Other Clinician: Referring Provider: Treating Provider/Extender: Vivien Rossettiannon, Oanh Devivo Wendling, Nicholas Weeks in Treatment: 7 T Contact Cast Applied for Wound Assessment: otal Wound #2 Right,Plantar Foot Performed By: Physician Duanne Guessannon, Amaru Burroughs, MD Post Procedure Diagnosis Same as Pre-procedure Notes scribed for Dr. Lady Garyannon by Samuella Bruinaylor Herrington, [name] Electronic Signature(s) Signed: 10/23/2022 12:19:37 PM By: Duanne Guessannon, Marcia Lepera MD FACS Signed: 10/23/2022 3:21:47 PM By: Samuella BruinHerrington, Taylor Entered By: Samuella BruinHerrington, Taylor on 10/23/2022 09:07:26 -------------------------------------------------------------------------------- SuperBill Details Patient Name: Date of Service: Carita PianPEGG, [name] 10/23/2022 Medical Record Number: [id number] Patient Account Number: [id number] Date of Birth/Sex: Treating [name]: 12/04/1955 (67 y.o. 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 I73.9 Peripheral vascular disease, unspecified I25.10 Atherosclerotic heart disease of native coronary artery without angina pectoris I10 Essential (primary) hypertension E11.65 Type 2 diabetes  mellitus with hyperglycemia E11.40 Type 2 diabetes mellitus with diabetic neuropathy, unspecified I63.9 Cerebral infarction, unspecified Facility Procedures : CPT4 Code: [id number] Description: 29445 - APPLY TOTAL CONTACT LEG CAST ICD-10 Diagnosis Description L97.512 Non-pressure chronic ulcer of other part of right foot with fat layer exposed Modifier: Quantity: 1 Physician Procedures : CPT4 Code Description Modifier [id number] 99213 - WC PHYS LEVEL 3 - EST PT ICD-10 Diagnosis Description L97.512 Non-pressure chronic ulcer of other part of right foot with fat layer exposed E11.65 Type 2 diabetes mellitus with hyperglycemia [name], Evelyn A  ([id number]) [id number] Peripheral vascular disease, unspecified E11.40 Type 2 diabetes mellitus with diabetic neuropathy, unspecified Quantity: 1 [id number].pdf Page 9 of 9 : [id number] 29445 - WC PHYS APPLY TOTAL CONTACT CAST ICD-10 Diagnosis Description L97.512 Non-pressure chronic ulcer of other part of right foot with fat layer  exposed Quantity: 1 Electronic Signature(s) Signed: 10/23/2022 9:37:37 AM By: Duanne Guess MD FACS Previous Signature: 10/23/2022 9:37:22 AM Version By: Duanne Guess MD FACS Entered By: Duanne Guess on 10/23/2022 09:37:37

## 2022-10-29 ENCOUNTER — Encounter (HOSPITAL_BASED_OUTPATIENT_CLINIC_OR_DEPARTMENT_OTHER): Payer: Medicare HMO | Admitting: General Surgery

## 2022-10-29 DIAGNOSIS — I5032 Chronic diastolic (congestive) heart failure: Secondary | ICD-10-CM | POA: Diagnosis not present

## 2022-10-29 DIAGNOSIS — I2510 Atherosclerotic heart disease of native coronary artery without angina pectoris: Secondary | ICD-10-CM | POA: Diagnosis not present

## 2022-10-29 DIAGNOSIS — E1140 Type 2 diabetes mellitus with diabetic neuropathy, unspecified: Secondary | ICD-10-CM | POA: Diagnosis not present

## 2022-10-29 DIAGNOSIS — Z8673 Personal history of transient ischemic attack (TIA), and cerebral infarction without residual deficits: Secondary | ICD-10-CM | POA: Diagnosis not present

## 2022-10-29 DIAGNOSIS — L97821 Non-pressure chronic ulcer of other part of left lower leg limited to breakdown of skin: Secondary | ICD-10-CM | POA: Diagnosis not present

## 2022-10-29 DIAGNOSIS — I110 Hypertensive heart disease with heart failure: Secondary | ICD-10-CM | POA: Diagnosis not present

## 2022-10-29 DIAGNOSIS — E11621 Type 2 diabetes mellitus with foot ulcer: Secondary | ICD-10-CM | POA: Diagnosis not present

## 2022-10-29 DIAGNOSIS — E1151 Type 2 diabetes mellitus with diabetic peripheral angiopathy without gangrene: Secondary | ICD-10-CM | POA: Diagnosis not present

## 2022-10-29 DIAGNOSIS — L97512 Non-pressure chronic ulcer of other part of right foot with fat layer exposed: Secondary | ICD-10-CM | POA: Diagnosis not present

## 2022-10-29 NOTE — Progress Notes (Signed)
VERNEL, LANGENDERFER [name] ([id number]) [id number] Page 1 of 9 Visit Report for 10/29/2022 Chief Complaint Document Details Patient Name: Date of Service: [name] [name], [name] [name]. 10/29/2022 10:00 [name] Medical Record Number: [id number] Patient Account Number: [id number] Date of Birth/Sex: Treating RN: Apr 10, 1956 (67 y.o. M) Primary Care Provider: Arva Chafe Other Clinician: Referring Provider: Treating Provider/Extender: [name] in Treatment: 8 Information Obtained from: Patient Chief Complaint 05/14/2020; patient is here for review of an abrasion injury on the left lateral calf 08/31/2022: [name] (1st metatarsal base, plantar) Electronic Signature(s) Signed: 10/29/2022 10:23:23 AM By: Duanne Guess MD FACS Entered By: Duanne [name] on 10/29/2022 10:23:23 -------------------------------------------------------------------------------- HPI Details Patient Name: Date of Service: [name] [name], [name] [name]. 10/29/2022 10:00 [name] Medical Record Number: [id number] Patient Account Number: [id number] Date of Birth/Sex: Treating RN: [birth year] (67 y.o. M) Primary Care Provider: Arva Chafe Other Clinician: Referring Provider: Treating Provider/Extender: [name] in Treatment: 8 History of Present Illness HPI Description: ADMISSION 05/14/2020; this is [name] with multiple medical issues. Predominantly he has type 2 diabetes with [name] and also history of fairly significant PAD. He had [name] in February 2017 he also had an atherectomy and angioplasty by Dr. [name] of the right popliteal artery in 2016. He is supposed to be getting arterial studies annually however this was interrupted last year because of the pandemic. He tells [place] he was at [facility] 2 weeks ago was getting out of of the scooter and traumatized his left  lateral lower leg. There was [name] as the patient is on Plavix and Eliquis. They have been dressing this with Neosporin and doing [name] fairly good job. Wound measures 2.5 x 3.5 it does not have any depth he does not have [name] in his legs outside of surgery however he does have chronic edema and skin changes suggestive of chronic venous disease possibly some degree of lymphedema as well. Past medical history includes type 2 diabetes with peripheral neuropathy and gait instability, lumbar spondylosis, obstructive sleep apnea, history of [name], basal cell skin cancer, atrial fibrillation on anticoagulation, significant PAD as noted with [name] and [name] in 30th 2016. He also has [name] of coronary artery disease with an MI in 2002 hypertension hyperlipidemia and heart failure with preserved ejection fraction His last arterial studies I can see in epic were on 03/10/2018 this showed [name] and [name] with monophasic waveforms on the right. On the left his ABI was 1.20 with [name] and triphasic waveforms. He has not had arterial studies since. Our nurse in the clinic got an ABI on the left of 1.1 11/2; left anterior leg wound in the setting of chronic venous insufficiency. Wound was initially trauma. We have been using Hydrofera Blue under compression he has home health.. The wound looks [name] lot better today with improvement in surface area 11/16; left anterior leg wound in the setting of chronic venous insufficiency. Wound was initially trauma. We have been using Hydrofera Blue under compression. The patient is closed today. He is supposed to follow-up with vein and vascular with regards to arterial insufficiency nevertheless his leg wounds are 12/3; apparently 2 weeks ago when they were putting on their stockings they managed  to get 3 wounds on the left anterior lower leg from abrasion when putting on the stockings. Home health came by the week of Thanksgiving and put Hydrofera Blue 4-layer wrap on this and there is only one superficial area remaining. The patient and his wife complained about the difficulties getting stockings on I think we are using 20/30. We will order bilateral external compression stockings which should be easier. 12/10; wound on the left anterior lower leg is closed. He has chronic venous insufficiency we ordered him Farrow wrap stockings unfortunately he did not bring these in. 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 He says that it has been present since mid December. He is currently residing in [facility] until March and [name] podiatrist has been looking after him there. They have been simply painting the area with Betadine. He has not had any lower extremity arterial studies since 2019, at which time his right ABI was 0.69. Measured in clinic today, it was 0.71. He is not aware of his most recent hemoglobin A1c, but historically he has had exceptionally poor control. On the basis of his right first metatarsal, there is [name] small crescent shaped wound. There is surrounding eschar and callus. There is no malodor or purulent [name], Sedrick [name] ([id number]) [id number] Page 2 of 9 drainage. 09/08/2022: The original wound is smaller today and fairly clean, but there is some discoloration and [name] pulpy texture to the adjacent callus. Underneath this, the tissue is open exposing the fat layer. It looks like perhaps there was [name] crack in the callus and moisture got under the skin and caused breakdown. 09/15/2022: There has been more moisture related tissue breakdown. The callus is very soft and the underlying tissue is more open. 09/28/2022: The wound looks much better. He has done [name] good job keeping it dry. There is some  callus overlying much of the wound surface. There is slough on the exposed open areas. 10/14/2022: There has been [name] lot of moisture related tissue breakdown. He is still forming callus over the top and then it seems that moisture gets underneath the callus and causes tissue damage. There is slough on the exposed wound surface. They will be moving back to the local area [place] the [place] this weekend. 10/21/2022: His foot is less wet, but there is no significant change to his wound. He has developed [name] blister on his left anterior tibial surface. There is no open wound here, but he does have some fairly significant edema. We are planning to apply [name] total contact cast today. 10/23/2022: Here for his obligatory first cast change. He says he has not had any issues wearing the cast or walking in the boot. No detrimental effects on his wound. 10/29/2022: The wound is looking better. Clearly, putting him in the cast has prevented water from getting in under his callus and causing further tissue breakdown. The blister on his left anterior tibial surface has not yet ruptured. Edema control is significantly improved. Electronic Signature(s) Signed: 10/29/2022 10:24:08 AM By: Duanne Guess MD FACS Entered By: Duanne [name] on 10/29/2022 10:24:08 -------------------------------------------------------------------------------- Physical Exam Details Patient Name: Date of Service: [name] [name], [name] [name]. 10/29/2022 10:00 [name] Medical Record Number: [id number] Patient Account Number: [id number] Date of Birth/Sex: Treating RN: 1955/11/07 (67 y.o. M) Primary Care Provider: Arva Chafe Other Clinician: Referring Provider: Treating Provider/Extender: [name] in Treatment: 8 Constitutional Hypertensive, asymptomatic. Bradycardic, asymptomatic. . . no  acute distress. Respiratory Normal work of breathing on room air. Notes 10/29/2022: The wound is looking better. Clearly, putting him in  the cast has prevented water from getting in under his callus and causing further tissue breakdown. The blister on his left anterior tibial surface has not yet ruptured. Edema control is significantly improved. Electronic Signature(s) Signed: 10/29/2022 10:24:52 AM By: Duanne Guess MD FACS Entered By: Duanne [name] on 10/29/2022 10:24:52 -------------------------------------------------------------------------------- Physician Orders Details Patient Name: Date of Service: [name] [name], [name] [name]. 10/29/2022 10:00 [name] Medical Record Number: [id number] Patient Account Number: [id number] Date of Birth/Sex: Treating RN: 1956-05-31 (67 y.o. [name] [name] Primary Care Provider: Arva Chafe Other Clinician: Referring Provider: Treating Provider/Extender: [name] in Treatment: 8 Verbal / Phone Orders: No Diagnosis Coding ICD-10 Coding Code Description [phone] Non-pressure chronic ulcer of other part of right foot with fat layer exposed I73.9 Peripheral vascular disease, unspecified I25.10 Atherosclerotic heart disease of native coronary artery without angina pectoris I10 Essential (primary) hypertension E11.65 Type 2 diabetes mellitus with hyperglycemia E11.40 Type 2 diabetes mellitus with diabetic neuropathy, unspecified [name] [name], [name] [name] ([id number]) [id number] Page 3 of 9 I63.9 Cerebral infarction, unspecified Follow-up Appointments ppointment in 1 week. - Dr. Lady Gary - room 2 Return [name] Anesthetic (In clinic) Topical Lidocaine 4% applied to wound bed Bathing/ Shower/ Hygiene May shower with protection but do not get wound dressing(s) wet. Protect dressing(s) with water repellant cover (for example, large plastic bag) or [name] cast cover and may then take shower. Edema Control - Lymphedema / SCD / Other Elevate legs to the level of the heart or above for 30 minutes daily and/or when sitting for 3-4 times [name] day throughout the  day. Avoid standing for long periods of time. Patient to wear own compression stockings every day. Moisturize legs daily. Off-Loading Total Contact Cast to Right Lower Extremity Removable cast walker boot to: - right leg Non Wound Condition Left Lower Extremity Other Non Wound Condition Orders/Instructions: - 3 layer wrap with silver alginate over blister Wound Treatment Wound #2 - Foot Wound Laterality: Plantar, Right Cleanser: Soap and Water 1 x Per Week/30 Days Discharge Instructions: May shower and wash wound with dial antibacterial soap and water prior to dressing change. Cleanser: Wound Cleanser 1 x Per Week/30 Days Discharge Instructions: Cleanse the wound with wound cleanser prior to applying [name] clean dressing using gauze sponges, not tissue or cotton balls. Prim Dressing: Hydrofera Blue Ready Transfer Foam, 2.5x2.5 (in/in) 1 x Per Week/30 Days ary Discharge Instructions: Apply directly to wound bed as directed Secondary Dressing: Woven Gauze Sponge, Non-Sterile 4x4 in 1 x Per Week/30 Days Discharge Instructions: Apply over primary dressing as directed. Secondary Dressing: Zetuvit Plus 4x8 in 1 x Per Week/30 Days Discharge Instructions: Apply over primary dressing as directed. Secured With: [employer], 4.5x3.1 (in/yd) 1 x Per Week/30 Days Discharge Instructions: Secure with Kerlix as directed. Patient Medications llergies: No Known Drug Allergies [name] Notifications Medication Indication Start End 10/29/2022 lidocaine DOSE topical 4 % cream - cream topical Electronic Signature(s) Signed: 10/29/2022 10:28:20 AM By: Duanne Guess MD FACS Entered By: Duanne [name] on 10/29/2022 10:25:50 -------------------------------------------------------------------------------- Problem List Details Patient Name: Date of Service: [name] [name], [name] [name]. 10/29/2022 10:00 [name] Medical Record Number: [id number] Patient Account Number: [id number] Date of Birth/Sex: Treating RN: [birth year] (67  y.o. M) Primary Care Provider: Arva Chafe Other Clinician: Referring Provider: Treating Provider/Extender: [name] in Treatment: 8 Active Problems ICD-10 Steffler, Zak [name] ([id number]) [id number]  Page 4 of 9 Encounter Code Description Active Date MDM Diagnosis L97.512 Non-pressure chronic ulcer of other part of right foot with fat layer exposed 08/31/2022 No Yes I73.9 Peripheral vascular disease, unspecified 08/31/2022 No Yes I25.10 Atherosclerotic heart disease of native coronary artery without angina pectoris 08/31/2022 No Yes I10 Essential (primary) hypertension 08/31/2022 No Yes E11.65 Type 2 diabetes mellitus with hyperglycemia 08/31/2022 No Yes E11.40 Type 2 diabetes mellitus with diabetic neuropathy, unspecified 08/31/2022 No Yes I63.9 Cerebral infarction, unspecified 08/31/2022 No Yes Inactive Problems Resolved Problems Electronic Signature(s) Signed: 10/29/2022 10:23:08 AM By: Duanne Guess MD FACS Entered By: Duanne [name] on 10/29/2022 10:23:08 -------------------------------------------------------------------------------- Progress Note Details Patient Name: Date of Service: [name] [name], [name] [name]. 10/29/2022 10:00 [name] Medical Record Number: [id number] Patient Account Number: [id number] Date of Birth/Sex: Treating RN: 1956/07/10 (67 y.o. M) Primary Care Provider: Arva Chafe Other Clinician: Referring Provider: Treating Provider/Extender: [name] in Treatment: 8 Subjective Chief Complaint Information obtained from Patient 05/14/2020; patient is here for review of an abrasion injury on the left lateral calf 08/31/2022: [name] (1st metatarsal base, plantar) History of Present Illness (HPI) ADMISSION 05/14/2020; this is [name] with multiple medical issues. Predominantly he has type 2 diabetes with [name] and also history of  fairly significant PAD. He had [name] in February 2017 he also had an atherectomy and angioplasty by Dr. [name] of the right popliteal artery in 2016. He is supposed to be getting arterial studies annually however this was interrupted last year because of the pandemic. He tells [place] he was at [facility] 2 weeks ago was getting out of of the scooter and traumatized his left lateral lower leg. There was [name] as the patient is on Plavix and Eliquis. They have been dressing this with Neosporin and doing [name] fairly good job. Wound measures 2.5 x 3.5 it does not have any depth he does not have [name] in his legs outside of surgery however he does have chronic edema and skin changes suggestive of chronic venous disease possibly some degree of lymphedema as well. Past medical history includes type 2 diabetes with peripheral neuropathy and gait instability, lumbar spondylosis, obstructive sleep apnea, history of [name], basal cell skin cancer, atrial fibrillation on anticoagulation, significant PAD as noted with [name] and [name] in 30th 2016. He also has [name] of coronary artery disease with an MI in 2002 hypertension hyperlipidemia and heart failure with preserved ejection fraction His last arterial studies I can see in epic were on 03/10/2018 this showed [name] and [name] with monophasic waveforms on the right. On the left his ABI was 1.20 with [name] and triphasic waveforms. He has not had arterial studies since. Our nurse in the clinic got an ABI on the left of 1.1 11/2; left anterior leg wound in the setting of chronic venous insufficiency. Wound was initially trauma. We have been using Hydrofera Blue under compression he has home health.. The wound looks [name] lot better today with  improvement in surface area [name] [name], [name] [name] ([id number]) [phone].pdf Page 5 of 9 11/16; left anterior leg wound in the setting of chronic venous insufficiency. Wound was initially trauma. We have been using Hydrofera Blue under compression. The patient is  closed today. He is supposed to follow-up with vein and vascular with regards to arterial insufficiency nevertheless his leg wounds are 12/3; apparently 2 weeks ago when they were putting on their stockings they managed to get 3 wounds on the left anterior lower leg from abrasion when putting on the stockings. Home health came by the week of Thanksgiving and put Hydrofera Blue 4-layer wrap on this and there is only one superficial area remaining. The patient and his wife complained about the difficulties getting stockings on I think we are using 20/30. We will order bilateral external compression stockings which should be easier. 12/10; wound on the left anterior lower leg is closed. He has chronic venous insufficiency we ordered him Farrow wrap stockings unfortunately he did not bring these in. 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 He says that it has been present since mid December. He is currently residing in [facility] until March and [name] podiatrist has been looking after him there. They have been simply painting the area with Betadine. He has not had any lower extremity arterial studies since 2019, at which time his right ABI was 0.69. Measured in clinic today, it was 0.71. He is not aware of his most recent hemoglobin A1c, but historically he has had exceptionally poor control. On the basis of his right first metatarsal, there is [name] small crescent shaped wound. There is surrounding eschar and callus. There is no malodor or purulent drainage. 09/08/2022: The original wound is smaller today and fairly clean, but there is some discoloration and [name]  pulpy texture to the adjacent callus. Underneath this, the tissue is open exposing the fat layer. It looks like perhaps there was [name] crack in the callus and moisture got under the skin and caused breakdown. 09/15/2022: There has been more moisture related tissue breakdown. The callus is very soft and the underlying tissue is more open. 09/28/2022: The wound looks much better. He has done [name] good job keeping it dry. There is some callus overlying much of the wound surface. There is slough on the exposed open areas. 10/14/2022: There has been [name] lot of moisture related tissue breakdown. He is still forming callus over the top and then it seems that moisture gets underneath the callus and causes tissue damage. There is slough on the exposed wound surface. They will be moving back to the local area [place] the [place] this weekend. 10/21/2022: His foot is less wet, but there is no significant change to his wound. He has developed [name] blister on his left anterior tibial surface. There is no open wound here, but he does have some fairly significant edema. We are planning to apply [name] total contact cast today. 10/23/2022: Here for his obligatory first cast change. He says he has not had any issues wearing the cast or walking in the boot. No detrimental effects on his wound. 10/29/2022: The wound is looking better. Clearly, putting him in the cast has prevented water from getting in under his callus and causing further tissue breakdown. The blister on his left anterior tibial surface has not yet ruptured. Edema control is significantly improved. Patient History Information obtained from Patient. Family History Unknown History. Social History Never smoker, Marital Status - Single, Alcohol Use - Never, Drug Use - No History, Caffeine Use - Rarely. Medical History Respiratory Patient has history of Sleep Apnea Cardiovascular Patient has history of Arrhythmia - [name]-Fib, Coronary Artery Disease - s/p  CABG, Deep Vein Thrombosis,  Hypertension, Myocardial Infarction, Peripheral Arterial Disease - s/p Fempop x 2 Endocrine Patient has history of Type II Diabetes Neurologic Patient has history of Neuropathy Hospitalization/Surgery History - colonoscopy. - polypectomy. - peripheral vascular cath. - shoulder arthroscopy. - carpal tunnel release. - coronary artery bypass. - appendectomy. - cardiac cath. - coronary angioplasty. - thrombectomy. - knee arthroplasty. - popliteal artery stent. 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 Bradycardic, asymptomatic. no acute distress. [name] [name], [name] [name] ([id number]) [id number] Page 6 of 9 Vitals Time Taken: 9:51 AM, Height: 70 in, Weight: 260 lbs, BMI: 37.3, Temperature: 98.0 F, Pulse: 55 bpm, Respiratory Rate: 18 breaths/min, Blood Pressure: 169/67 mmHg. Respiratory Normal work of breathing on room air. General Notes: 10/29/2022: The wound is looking better. Clearly, putting him in the cast has prevented water from getting in under his callus and causing further tissue breakdown. The blister on his left anterior tibial surface has not yet ruptured. Edema control is significantly improved. Integumentary (Hair, Skin) Wound #2 status is Open. Original cause of wound was Gradually Appeared. The date acquired was: 07/08/2022. The wound has been in treatment 8 weeks. The wound is located on the Right,Plantar Foot. The wound measures 0.9cm length x 4.9cm width x 0.1cm depth; 3.464cm^2 area and 0.346cm^3 volume. There is Fat Layer (Subcutaneous Tissue) exposed. There is no tunneling or undermining noted. There is [name] medium amount of serosanguineous drainage noted. The wound margin is distinct with the outline attached to the wound base. There is medium (34-66%) red granulation within the wound bed. There is [name]  medium (34-66%) amount of necrotic tissue within the wound bed including Adherent Slough. The periwound skin appearance had no abnormalities noted for moisture. The periwound skin appearance had no abnormalities noted for color. The periwound skin appearance exhibited: Callus. Periwound temperature was noted as No Abnormality. Assessment Active Problems ICD-10 Non-pressure chronic ulcer of other part of right foot with fat layer exposed Peripheral vascular disease, unspecified Atherosclerotic heart disease of native coronary artery without angina pectoris Essential (primary) hypertension Type 2 diabetes mellitus with hyperglycemia Type 2 diabetes mellitus with diabetic neuropathy, unspecified Cerebral infarction, unspecified Procedures Wound #2 Pre-procedure diagnosis of Wound #2 is [name] Diabetic Wound/Ulcer of the Lower Extremity located on the Right,Plantar Foot . There was [name] T Contact Cast otal Procedure by Duanne Guessannon, Miche Loughridge, MD. Post procedure Diagnosis Wound #2: Same as Pre-Procedure Notes: scribed for Dr. Lady Garyannon by Samuella Bruinaylor Herrington, RN. Plan Follow-up Appointments: Return Appointment in 1 week. - Dr. Lady Garyannon - room 2 Anesthetic: (In clinic) Topical Lidocaine 4% applied to wound bed Bathing/ Shower/ Hygiene: May shower with protection but do not get wound dressing(s) wet. Protect dressing(s) with water repellant cover (for example, large plastic bag) or [name] cast cover and may then take shower. Edema Control - Lymphedema / SCD / Other: Elevate legs to the level of the heart or above for 30 minutes daily and/or when sitting for 3-4 times [name] day throughout the day. Avoid standing for long periods of time. Patient to wear own compression stockings every day. Moisturize legs daily. Off-Loading: T Contact Cast to Right Lower Extremity otal Removable cast walker boot to: - right leg Non Wound Condition: Other Non Wound Condition Orders/Instructions: - 3 layer wrap with silver alginate  over blister The following medication(s) was prescribed: lidocaine topical 4 % cream cream topical was prescribed at facility WOUND #2: - Foot Wound  Laterality: Plantar, Right Cleanser: Soap and Water 1 x Per Week/30 Days Discharge Instructions: May shower and wash wound with dial antibacterial soap and water prior to dressing change. Cleanser: Wound Cleanser 1 x Per Week/30 Days Discharge Instructions: Cleanse the wound with wound cleanser prior to applying [name] clean dressing using gauze sponges, not tissue or cotton balls. Prim Dressing: Hydrofera Blue Ready Transfer Foam, 2.5x2.5 (in/in) 1 x Per Week/30 Days ary Discharge Instructions: Apply directly to wound bed as directed Secondary Dressing: Woven Gauze Sponge, Non-Sterile 4x4 in 1 x Per Week/30 Days Discharge Instructions: Apply over primary dressing as directed. Secondary Dressing: Zetuvit Plus 4x8 in 1 x Per Week/30 Days Discharge Instructions: Apply over primary dressing as directed. Secured With: [employer], 4.5x3.1 (in/yd) 1 x Per Week/30 Days Discharge Instructions: Secure with Kerlix as directed. [name] [name], [name] [name] ([id number]) [id number] Page 7 of 9 10/29/2022: The wound is looking better. Clearly, putting him in the cast has prevented water from getting in under his callus and causing further tissue breakdown. The blister on his left anterior tibial surface has not yet ruptured. Edema control is significantly improved. The wound surface was prepared for total contact casting. Hydrofera Blue was applied and then the total contact cast was applied without difficulty or complication. Will continue to apply silver alginate over the blister on his left anterior tibia along with 3 layer compression. 1 week. Electronic Signature(s) Signed: 10/29/2022 10:27:16 AM By: Duanne Guess MD FACS Entered By: Duanne [name] on 10/29/2022  10:27:15 -------------------------------------------------------------------------------- HxROS Details Patient Name: Date of Service: [name] [name], [name] [name]. 10/29/2022 10:00 [name] Medical Record Number: [id number] Patient Account Number: [id number] Date of Birth/Sex: Treating RN: 1955/11/19 (67 y.o. M) Primary Care Provider: Arva Chafe Other Clinician: Referring Provider: Treating Provider/Extender: [name] in Treatment: 8 Information Obtained From Patient Respiratory Medical History: Positive for: Sleep Apnea Cardiovascular Medical History: Positive for: Arrhythmia - [name]-Fib; Coronary Artery Disease - s/p CABG; Deep Vein Thrombosis; Hypertension; Myocardial Infarction; Peripheral Arterial Disease - s/p Fempop x 2 Past Medical History Notes: CVA x 3 Endocrine Medical History: Positive for: Type II Diabetes Time with diabetes: since 1997 Treated with: Insulin, Oral agents Blood sugar tested every day: Yes Tested : 2-3x per day Musculoskeletal Medical History: Past Medical History Notes: carpal tunnel syndrome Neurologic Medical History: Positive for: Neuropathy Past Medical History Notes: stroke Oncologic Medical History: Past Medical History Notes: skin cancer Immunizations Pneumococcal Vaccine: Received Pneumococcal Vaccination: Yes Received Pneumococcal Vaccination On or After 60th Birthday: Yes Implantable Devices [name] [name], [name] [name] ([id number]) [id number] Page 8 of 9 None Hospitalization / Surgery History Type of Hospitalization/Surgery colonoscopy polypectomy peripheral vascular cath shoulder arthroscopy carpal tunnel release coronary artery bypass appendectomy cardiac cath coronary angioplasty thrombectomy knee arthroplasty popliteal artery stent Family and Social History Unknown History: Yes; Never smoker; Marital Status - Single; Alcohol Use: Never; Drug Use: No History; Caffeine Use: Rarely;  Financial Concerns: No; Food, Clothing or Shelter Needs: No; Support System Lacking: No; Transportation Concerns: No Electronic Signature(s) Signed: 10/29/2022 10:28:20 AM By: Duanne Guess MD FACS Entered By: Duanne [name] on 10/29/2022 10:24:20 -------------------------------------------------------------------------------- Total Contact Cast Details Patient Name: Date of Service: [name] [name], [name] [name]. 10/29/2022 10:00 [name] Medical Record Number: [id number] Patient Account Number: [id number] Date of Birth/Sex: Treating RN: February 05, 1956 (67 y.o. [name] [name] Primary Care Provider: Arva Chafe Other Clinician: Referring Provider: Treating Provider/Extender: [name] in Treatment: 8 T Contact Cast Applied for Wound Assessment: otal Wound #2 Right,Plantar  Foot Performed By: Physician Duanne Guess, MD Post Procedure Diagnosis Same as Pre-procedure Notes scribed for Dr. Lady Gary by Samuella Bruin, RN Electronic Signature(s) Signed: 10/29/2022 10:28:20 AM By: Duanne Guess MD FACS Signed: 10/29/2022 3:58:28 PM By: Samuella Bruin Entered By: Samuella Bruin on 10/29/2022 10:17:41 -------------------------------------------------------------------------------- SuperBill Details Patient Name: Date of Service: [name] [name], [name] [name]. 10/29/2022 Medical Record Number: [id number] Patient Account Number: [id number] Date of Birth/Sex: Treating RN: 05-21-1956 (67 y.o. M) Primary Care Provider: Arva Chafe Other Clinician: Referring Provider: Treating Provider/Extender: [name] in Treatment: 8 Diagnosis Coding ICD-10 Codes Code Description [phone] Non-pressure chronic ulcer of other part of right foot with fat layer exposed I73.9 Peripheral vascular disease, unspecified I25.10 Atherosclerotic heart disease of native coronary artery without angina pectoris I10 Essential (primary) hypertension E11.65 Type  2 diabetes mellitus with hyperglycemia [name] [name], [name] [name] ([id number]) [id number] Page 9 of 9 E11.40 Type 2 diabetes mellitus with diabetic neuropathy, unspecified I63.9 Cerebral infarction, unspecified Facility Procedures : CPT4 Code: [id number] Description: [phone] - APPLY TOTAL CONTACT LEG CAST ICD-10 Diagnosis Description L97.512 Non-pressure chronic ulcer of other part of right foot with fat layer exposed Modifier: Quantity: 1 Physician Procedures : CPT4 Code Description Modifier [id number] - WC PHYS LEVEL 3 - EST PT ICD-10 Diagnosis Description L97.512 Non-pressure chronic ulcer of other part of right foot with fat layer exposed E11.40 Type 2 diabetes mellitus with diabetic neuropathy,  unspecified E11.65 Type 2 diabetes mellitus with hyperglycemia I73.9 Peripheral vascular disease, unspecified Quantity: 1 : [phone] [id number] - WC PHYS APPLY TOTAL CONTACT CAST ICD-10 Diagnosis Description L97.512 Non-pressure chronic ulcer of other part of right foot with fat layer exposed Quantity: 1 Electronic Signature(s) Signed: 10/29/2022 10:27:38 AM By: Duanne Guess MD FACS Entered By: Duanne [name] on 10/29/2022 10:27:37

## 2022-10-29 NOTE — Progress Notes (Signed)
[name], [name] ([id number]) [id number] Page 1 of 6 Visit Report for 10/29/2022 Arrival Information Details Patient Name: Date of Service: [name], [name] 10/29/2022 10:00 A M Medical Record Number: [id number] Patient Account Number: [id number] Date of Birth/Sex: Treating RN: 04/22/1956 (67 y.o. M) Primary Care Marjan Rosman: Arva ChafeWendling, Nicholas Other Clinician: Referring Dayana Dalporto: Treating Jearline Hirschhorn/Extender: Vivien Rossettiannon, Jennifer [name], Nicholas Weeks in Treatment: 8 Visit Information History Since Last Visit All ordered tests and consults were completed: No Patient Arrived: Ambulatory Added or deleted any medications: No Arrival Time: 09:51 Any new allergies or adverse reactions: No Accompanied By: wife Had a fall or experienced change in No Transfer Assistance: None activities of daily living that may affect Patient Identification Verified: Yes risk of falls: Secondary Verification Process Completed: Yes Signs or symptoms of abuse/neglect since last visito No Hospitalized since last visit: No Implantable device outside of the clinic excluding No cellular tissue based products placed in the center since last visit: Pain Present Now: No Electronic Signature(s) Signed: 10/29/2022 11:49:34 AM By: [place] Scrapeoss, Aisha Entered By: [place] Scrapeoss, Aisha on 10/29/2022 09:51:44 -------------------------------------------------------------------------------- Lower Extremity Assessment Details Patient Name: Date of Service: [name] 10/29/2022 10:00 A M Medical Record Number: [id number] Patient Account Number: [id number] Date of Birth/Sex: Treating RN: 11/10/1955 (67 y.o. [name] PalauM) [name], [name] Primary Care Krystal Delduca: Arva ChafeWendling, Nicholas Other Clinician: Referring Shaquel Chavous: Treating Kalyse Meharg/Extender: Vivien Rossettiannon, Jennifer [name], Nicholas Weeks in Treatment: 8 Edema Assessment Assessed: [Left: No] [Right: No] [Left: Edema] [Right: :] Calf Left: Right: Point of Measurement: From Medial  Instep 39.4 cm 38.8 cm Ankle Left: Right: Point of Measurement: From Medial Instep 26 cm 27.2 cm Vascular Assessment Pulses: Dorsalis Pedis Palpable: [Left:Yes] [Right:Yes] Electronic Signature(s) Signed: 10/29/2022 3:58:28 PM By: Samuella BruinHerrington, [name] Entered By: Samuella BruinHerrington, [name] on 10/29/2022 10:10:44 Ryther, Josep A ([id number]) [id number] Page 2 of 6 -------------------------------------------------------------------------------- Multi Wound Chart Details Patient Name: Date of Service: [name], [name] 10/29/2022 10:00 A M Medical Record Number: [id number] Patient Account Number: [id number] Date of Birth/Sex: Treating RN: 01/19/1956 (67 y.o. M) Primary Care Ladaja Yusupov: Arva ChafeWendling, Nicholas Other Clinician: Referring Irais Mottram: Treating Maher Shon/Extender: Vivien Rossettiannon, Jennifer [name], Nicholas Weeks in Treatment: 8 Vital Signs Height(in): 70 Pulse(bpm): 55 Weight(lbs): 260 Blood Pressure(mmHg): 169/67 Body Mass Index(BMI): 37.3 Temperature(F): 98.0 Respiratory Rate(breaths/min): 18 [2:Photos:] [N/A:N/A] Right, Plantar Foot N/A N/A Wound Location: Gradually Appeared N/A N/A Wounding Event: Diabetic Wound/Ulcer of the Lower N/A N/A Primary Etiology: Extremity Sleep Apnea, Arrhythmia, Coronary N/A N/A Comorbid History: Artery Disease, Deep Vein Thrombosis, Hypertension, Myocardial Infarction, Peripheral Arterial Disease, Type II Diabetes, Neuropathy 07/08/2022 N/A N/A Date Acquired: 8 N/A N/A Weeks of Treatment: Open N/A N/A Wound Status: No N/A N/A Wound Recurrence: 0.9x4.9x0.1 N/A N/A Measurements L x W x D (cm) 3.464 N/A N/A A (cm) : rea 0.346 N/A N/A Volume (cm) : -35.70% N/A N/A % Reduction in A rea: -35.70% N/A N/A % Reduction in Volume: Grade 1 N/A N/A Classification: Medium N/A N/A Exudate A mount: Serosanguineous N/A N/A Exudate Type: red, brown N/A N/A Exudate Color: Distinct, outline attached N/A N/A Wound  Margin: Medium (34-66%) N/A N/A Granulation A mount: Red N/A N/A Granulation Quality: Medium (34-66%) N/A N/A Necrotic A mount: Fat Layer (Subcutaneous Tissue): Yes N/A N/A Exposed Structures: Fascia: No Tendon: No Muscle: No Joint: No Bone: No Medium (34-66%) N/A N/A Epithelialization: Callus: Yes N/A N/A Periwound Skin Texture: No Abnormalities Noted N/A N/A Periwound Skin Moisture: No Abnormalities Noted N/A N/A Periwound Skin Color: No Abnormality N/A N/A Temperature: T Contact Cast otal N/A N/A Procedures  Performed: Treatment Notes Electronic Signature(s) Signed: 10/29/2022 10:23:16 AM By: Duanne Guess MD FACS Entered By: Duanne Guess on 10/29/2022 10:23:15 Multi-Disciplinary Care Plan Details -------------------------------------------------------------------------------- Coralie Carpen A ([id number]) [id number] Page 3 of 6 Patient Name: Date of Service: [name], [name] 10/29/2022 10:00 A M Medical Record Number: [id number] Patient Account Number: [id number] Date of Birth/Sex: Treating RN: April 28, 1956 (67 y.o. [name] Primary Care Rosela Supak: Arva Chafe Other Clinician: Referring Jessamy Torosyan: Treating [name]/Extender: [name] in Treatment: 8 Active Inactive Abuse / Safety / Falls / Self Care Management Nursing Diagnoses: Impaired physical mobility Potential for falls Goals: Patient will not experience any injury related to falls Date Initiated: 08/31/2022 Target Resolution Date: 12/18/2022 Goal Status: Active Patient/caregiver will verbalize/demonstrate measures taken to improve the patient's personal safety Date Initiated: 08/31/2022 Target Resolution Date: 01/15/2023 Goal Status: Active Interventions: Provide education on basic hygiene Provide education on fall prevention Notes: Wound/Skin Impairment Nursing Diagnoses: Impaired tissue integrity Knowledge deficit related to  ulceration/compromised skin integrity Goals: Patient/caregiver will verbalize understanding of skin care regimen Date Initiated: 08/31/2022 Target Resolution Date: 12/19/2022 Goal Status: Active Interventions: Assess patient/caregiver ability to obtain necessary supplies Assess patient/caregiver ability to perform ulcer/skin care regimen upon admission and as needed Assess ulceration(s) every visit Treatment Activities: Skin care regimen initiated : 08/31/2022 Topical wound management initiated : 08/31/2022 Notes: Electronic Signature(s) Signed: 10/29/2022 3:58:28 PM By: Samuella Bruin Entered By: Samuella Bruin on 10/29/2022 10:10:58 -------------------------------------------------------------------------------- Pain Assessment Details Patient Name: Date of Service: [name] Pian, [name] 10/29/2022 10:00 A M Medical Record Number: [id number] Patient Account Number: [id number] Date of Birth/Sex: Treating RN: 06-Mar-1956 (67 y.o. M) Primary Care Khelani Kops: Arva Chafe Other Clinician: Referring Nohely Whitehorn: Treating Linet Brash/Extender: [name] in Treatment: 8 Active Problems Location of Pain Severity and Description of Pain Patient Has Paino No Site Locations Wipperfurth, Franck A ([id number]) [phone].pdf Page 4 of 6 Pain Management and Medication Current Pain Management: Electronic Signature(s) Signed: 10/29/2022 11:49:34 AM By: Dayton Scrape Entered By: Dayton Scrape on 10/29/2022 09:52:31 -------------------------------------------------------------------------------- Patient/Caregiver Education Details Patient Name: Date of Service: [place], [name] 4/11/2024andnbsp10:00 A M Medical Record Number: [id number] Patient Account Number: [id number] Date of Birth/Gender: Treating RN: [birth year] (67 y.o. [name] Primary Care Physician: Arva Chafe Other Clinician: Referring Physician: Treating Physician/Extender:  [name] in Treatment: 8 Education Assessment Education Provided To: Patient Education Topics Provided Offloading: Methods: Explain/Verbal Responses: Reinforcements needed, State content correctly Electronic Signature(s) Signed: 10/29/2022 3:58:28 PM By: Samuella Bruin Entered By: Samuella Bruin on 10/29/2022 10:11:14 -------------------------------------------------------------------------------- Wound Assessment Details Patient Name: Date of Service: [name] Pian, [name] 10/29/2022 10:00 A M Medical Record Number: [id number] Patient Account Number: [id number] Date of Birth/Sex: Treating RN: Aug 14, 1955 (67 y.o. M) Primary Care Sai Moura: Arva Chafe Other Clinician: Referring Jamita Mckelvin: Treating Kingstyn Deruiter/Extender: [name] in Treatment: 8 Wound Status Wound Number: 2 Primary Diabetic Wound/Ulcer of the Lower Extremity Etiology: Wound Location: Right, Plantar Foot Wound Open Wounding Event: Gradually Appeared Status: Date Acquired: 07/08/2022 Comorbid Sleep Apnea, Arrhythmia, Coronary Artery Disease, Deep Vein Weeks Of Treatment: 8 History: Thrombosis, Hypertension, Myocardial Infarction, Peripheral Arterial Clustered Wound: No Disease, Type II Diabetes, Neuropathy Prowell, Selah A ([id number]) [phone].pdf Page 5 of 6 Photos Wound Measurements Length: (cm) 0.9 Width: (cm) 4.9 Depth: (cm) 0.1 Area: (cm) 3.464 Volume: (cm) 0.346 % Reduction in Area: -35.7% % Reduction in Volume: -35.7% Epithelialization: Medium (34-66%) Tunneling: No Undermining: No Wound Description Classification: Grade 1 Wound Margin: Distinct, outline  attached Exudate Amount: Medium Exudate Type: Serosanguineous Exudate Color: red, brown Foul Odor After Cleansing: No Slough/Fibrino Yes Wound Bed Granulation Amount: Medium (34-66%) Exposed Structure Granulation Quality: Red Fascia Exposed:  No Necrotic Amount: Medium (34-66%) Fat Layer (Subcutaneous Tissue) Exposed: Yes Necrotic Quality: Adherent Slough Tendon Exposed: No Muscle Exposed: No Joint Exposed: No Bone Exposed: No Periwound Skin Texture Texture Color No Abnormalities Noted: No No Abnormalities Noted: Yes Callus: Yes Temperature / Pain Temperature: No Abnormality Moisture No Abnormalities Noted: Yes Electronic Signature(s) Signed: 10/29/2022 3:58:28 PM By: Samuella Bruin Entered By: Samuella Bruin on 10/29/2022 10:13:14 -------------------------------------------------------------------------------- Vitals Details Patient Name: Date of Service: [name] Pian, [name] 10/29/2022 10:00 A M Medical Record Number: [id number] Patient Account Number: [id number] Date of Birth/Sex: Treating RN: Apr 06, 1956 (67 y.o. M) Primary Care Isaiahs Chancy: Arva Chafe Other Clinician: Referring Eshal Propps: Treating Irene Collings/Extender: [name] in Treatment: 8 Vital Signs Time Taken: 09:51 Temperature (F): 98.0 Height (in): 70 Pulse (bpm): 55 Weight (lbs): 260 Respiratory Rate (breaths/min): 18 Body Mass Index (BMI): 37.3 Blood Pressure (mmHg): 169/67 Reference Range: 80 - 120 mg / dl Electronic Signature(s) Signed: 10/29/2022 11:49:34 AM By: Waneta Martins, Walton A ([id number]) [id number] Page 6 of 6 Entered By: Dayton Scrape on 10/29/2022 09:52:14

## 2022-11-05 ENCOUNTER — Encounter (HOSPITAL_BASED_OUTPATIENT_CLINIC_OR_DEPARTMENT_OTHER): Payer: Medicare HMO | Admitting: General Surgery

## 2022-11-05 DIAGNOSIS — Z8673 Personal history of transient ischemic attack (TIA), and cerebral infarction without residual deficits: Secondary | ICD-10-CM | POA: Diagnosis not present

## 2022-11-05 DIAGNOSIS — E1151 Type 2 diabetes mellitus with diabetic peripheral angiopathy without gangrene: Secondary | ICD-10-CM | POA: Diagnosis not present

## 2022-11-05 DIAGNOSIS — E11621 Type 2 diabetes mellitus with foot ulcer: Secondary | ICD-10-CM | POA: Diagnosis not present

## 2022-11-05 DIAGNOSIS — L97821 Non-pressure chronic ulcer of other part of left lower leg limited to breakdown of skin: Secondary | ICD-10-CM | POA: Diagnosis not present

## 2022-11-05 DIAGNOSIS — L97512 Non-pressure chronic ulcer of other part of right foot with fat layer exposed: Secondary | ICD-10-CM | POA: Diagnosis not present

## 2022-11-05 DIAGNOSIS — I5032 Chronic diastolic (congestive) heart failure: Secondary | ICD-10-CM | POA: Diagnosis not present

## 2022-11-05 DIAGNOSIS — I110 Hypertensive heart disease with heart failure: Secondary | ICD-10-CM | POA: Diagnosis not present

## 2022-11-05 DIAGNOSIS — I2510 Atherosclerotic heart disease of native coronary artery without angina pectoris: Secondary | ICD-10-CM | POA: Diagnosis not present

## 2022-11-05 DIAGNOSIS — L97522 Non-pressure chronic ulcer of other part of left foot with fat layer exposed: Secondary | ICD-10-CM | POA: Diagnosis not present

## 2022-11-05 DIAGNOSIS — E1140 Type 2 diabetes mellitus with diabetic neuropathy, unspecified: Secondary | ICD-10-CM | POA: Diagnosis not present

## 2022-11-06 NOTE — Progress Notes (Signed)
TAKSH, HJORT A ([id number]) [id number] Page 1 of 10 Visit Report for 11/05/2022 Chief Complaint Document Details Patient Name: Date of Service: [place], [name] 11/05/2022 9:30 A M Medical Record Number: [id number] Patient Account Number: [id number] Date of Birth/Sex: Treating RN: 06/11/1956 (67 y.o. M) Primary Care Provider: Arva Chafe Other Clinician: Referring Provider: Treating Provider/Extender: [name] in Treatment: 9 Information Obtained from: Patient Chief Complaint 05/14/2020; patient is here for review of an abrasion injury on the left lateral calf 08/31/2022: [name] (1st metatarsal base, plantar) Electronic Signature(s) Signed: 11/05/2022 10:30:55 AM By: Duanne Guess MD FACS Entered By: Duanne Guess on 11/05/2022 10:30:55 -------------------------------------------------------------------------------- Debridement Details Patient Name: Date of Service: [name], [name] 11/05/2022 9:30 A M Medical Record Number: [id number] Patient Account Number: [id number] Date of Birth/Sex: Treating RN: 1955-09-05 (67 y.o. Cline Cools Primary Care Provider: Arva Chafe Other Clinician: Referring Provider: Treating Provider/Extender: [name] in Treatment: 9 Debridement Performed for Assessment: Wound #3 Left,Medial Lower Leg Performed By: Physician Duanne Guess, MD Debridement Type: Debridement Severity of Tissue Pre Debridement: Fat layer exposed Level of Consciousness (Pre-procedure): Awake and Alert Pre-procedure Verification/Time Out Yes - 09:50 Taken: Start Time: 09:54 Pain Control: Lidocaine 4% T opical Solution T Area Debrided (L x W): otal 3 (cm) x 2.5 (cm) = 7.5 (cm) Tissue and other material debrided: Non-Viable, Skin: Dermis , Skin: Epidermis Level: Skin/Epidermis Debridement Description: Selective/Open Wound Instrument: Forceps,  Scissors Bleeding: Minimum Hemostasis Achieved: Pressure Procedural Pain: 0 Post Procedural Pain: 0 Response to Treatment: Procedure was tolerated well Level of Consciousness (Post- Awake and Alert procedure): Post Debridement Measurements of Total Wound Length: (cm) 3 Width: (cm) 2.5 Depth: (cm) 0.1 Volume: (cm) 0.589 Character of Wound/Ulcer Post Debridement: Improved Severity of Tissue Post Debridement: Fat layer exposed Post Procedure Diagnosis Same as Pre-procedure Notes Scribed for Dr Lady Gary by Redmond Pulling, RN Electronic Signature(s) [name], Peretz A ([id number]) [id number] Page 2 of 10 Signed: 11/05/2022 12:09:09 PM By: Duanne Guess MD FACS Signed: 11/05/2022 3:58:17 PM By: Redmond Pulling RN, BSN Entered By: Redmond Pulling on 11/05/2022 09:55:18 -------------------------------------------------------------------------------- HPI Details Patient Name: Date of Service: [name], [name] 11/05/2022 9:30 A M Medical Record Number: [id number] Patient Account Number: [id number] Date of Birth/Sex: Treating RN: [birth year] (67 y.o. M) Primary Care Provider: Arva Chafe Other Clinician: Referring Provider: Treating Provider/Extender: [name] in Treatment: 9 History of Present Illness HPI Description: ADMISSION 05/14/2020; this is a 67 year old man with multiple medical issues. Predominantly he has type 2 diabetes with a history of peripheral neuropathy and also history of fairly significant PAD. He had a left superficial femoral to posterior tibial artery bypass in February 2017 he also had an atherectomy and angioplasty by Dr. [name] of the right popliteal artery in 2016. He is supposed to be getting arterial studies annually however this was interrupted last year because of the pandemic. He tells [place] he was at [facility] 2 weeks ago was getting out of of the scooter and traumatized his left lateral lower leg. There  was a lot of bleeding as the patient is on Plavix and Eliquis. They have been dressing this with Neosporin and doing a fairly good job. Wound measures 2.5 x 3.5 it does not have any depth he does not have a wound history in his legs outside of surgery however he does have chronic edema and skin changes suggestive of chronic venous disease possibly some degree of lymphedema as well. Past medical  history includes type 2 diabetes with peripheral neuropathy and gait instability, lumbar spondylosis, obstructive sleep apnea, history of a left pontine CVA, basal cell skin cancer, atrial fibrillation on anticoagulation, significant PAD as noted with a left superficial femoral to posterior tibial bypass in February 2017 and a right popliteal atherectomy and angioplasty by Dr. [name] in 30th 2016. He also has a history of coronary artery disease with an MI in 2002 hypertension hyperlipidemia and heart failure with preserved ejection fraction His last arterial studies I can see in epic were on 03/10/2018 this showed a right ABI of 0.69 and a right TBI of 0.5 with monophasic waveforms on the right. On the left his ABI was 1.20 with a TBI of 0.92 and triphasic waveforms. He has not had arterial studies since. Our nurse in the clinic got an ABI on the left of 1.1 11/2; left anterior leg wound in the setting of chronic venous insufficiency. Wound was initially trauma. We have been using Hydrofera Blue under compression he has home health.. The wound looks a lot better today with improvement in surface area 11/16; left anterior leg wound in the setting of chronic venous insufficiency. Wound was initially trauma. We have been using Hydrofera Blue under compression. The patient is closed today. He is supposed to follow-up with vein and vascular with regards to arterial insufficiency nevertheless his leg wounds are 12/3; apparently 2 weeks ago when they were putting on their stockings they managed to get 3 wounds on the  left anterior lower leg from abrasion when putting on the stockings. Home health came by the week of Thanksgiving and put Hydrofera Blue 4-layer wrap on this and there is only one superficial area remaining. The patient and his wife complained about the difficulties getting stockings on I think we are using 20/30. We will order bilateral external compression stockings which should be easier. 12/10; wound on the left anterior lower leg is closed. He has chronic venous insufficiency we ordered him Farrow wrap stockings unfortunately he did not bring these in. 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 He says that it has been present since mid December. He is currently residing in [facility] until March and a podiatrist has been looking after him there. They have been simply painting the area with Betadine. He has not had any lower extremity arterial studies since 2019, at which time his right ABI was 0.69. Measured in clinic today, it was 0.71. He is not aware of his most recent hemoglobin A1c, but historically he has had exceptionally poor control. On the basis of his right first metatarsal, there is a small crescent shaped wound. There is surrounding eschar and callus. There is no malodor or purulent drainage. 09/08/2022: The original wound is smaller today and fairly clean, but there is some discoloration and a pulpy texture to the adjacent callus. Underneath this, the tissue is open exposing the fat layer. It looks like perhaps there was a crack in the callus and moisture got under the skin and caused breakdown. 09/15/2022: There has been more moisture related tissue breakdown. The callus is very soft and the underlying tissue is more open. 09/28/2022: The wound looks much better. He has done a good job keeping it dry. There is some callus overlying much of the wound surface. There is slough on the exposed open areas. 10/14/2022:  There has been a lot of moisture related tissue breakdown. He is still forming  callus over the top and then it seems that moisture gets underneath the callus and causes tissue damage. There is slough on the exposed wound surface. They will be moving back to the local area from the beach this weekend. 10/21/2022: His foot is less wet, but there is no significant change to his wound. He has developed a blister on his left anterior tibial surface. There is no open wound here, but he does have some fairly significant edema. We are planning to apply a total contact cast today. 10/23/2022: Here for his obligatory first cast change. He says he has not had any issues wearing the cast or walking in the boot. No detrimental effects on his wound. 10/29/2022: The wound is looking better. Clearly, putting him in the cast has prevented water from getting in under his callus and causing further tissue breakdown. The blister on his left anterior tibial surface has not yet ruptured. Edema control is significantly improved. 11/05/2022: The wound on his right plantar foot is getting better. There is still some callus accumulation around the wounds, but there are just 2 open areas now, a very small 1 on the medial aspect of his metatarsal head and a little bit larger 1 on the plantar surface. The blister on his left anterior tibial surface is now open with hanging dry skin. Electronic Signature(s) Breshears, Chaske A ([id number]) [id number] Page 3 of 10 Signed: 11/05/2022 10:31:54 AM By: Duanne Guess MD FACS Entered By: Duanne Guess on 11/05/2022 10:31:54 -------------------------------------------------------------------------------- Physical Exam Details Patient Name: Date of Service: [name], [name] 11/05/2022 9:30 A M Medical Record Number: [id number] Patient Account Number: [id number] Date of Birth/Sex: Treating RN: Feb 09, 1956 (67 y.o. M) Primary Care Provider: Arva Chafe Other  Clinician: Referring Provider: Treating Provider/Extender: [name] in Treatment: 9 Constitutional Hypertensive, asymptomatic. Bradycardic, asymptomatic. . . no acute distress. Respiratory Normal work of breathing on room air. Notes 11/05/2022: The wound on his right plantar foot is getting better. There is still some callus accumulation around the wounds, but there are just 2 open areas now, a very small 1 on the medial aspect of his metatarsal head and a little bit larger 1 on the plantar surface. The blister on his left anterior tibial surface is now open with hanging dry skin. Electronic Signature(s) Signed: 11/05/2022 10:32:35 AM By: Duanne Guess MD FACS Entered By: Duanne Guess on 11/05/2022 10:32:35 -------------------------------------------------------------------------------- Physician Orders Details Patient Name: Date of Service: [name], [name] 11/05/2022 9:30 A M Medical Record Number: [id number] Patient Account Number: [id number] Date of Birth/Sex: Treating RN: 1956/06/11 (67 y.o. Cline Cools Primary Care Provider: Arva Chafe Other Clinician: Referring Provider: Treating Provider/Extender: [name] in Treatment: 9 Verbal / Phone Orders: No Diagnosis Coding ICD-10 Coding Code Description [phone] Non-pressure chronic ulcer of other part of right foot with fat layer exposed L97.821 Non-pressure chronic ulcer of other part of left lower leg limited to breakdown of skin I73.9 Peripheral vascular disease, unspecified I25.10 Atherosclerotic heart disease of native coronary artery without angina pectoris I10 Essential (primary) hypertension E11.65 Type 2 diabetes mellitus with hyperglycemia E11.40 Type 2 diabetes mellitus with diabetic neuropathy, unspecified I63.9 Cerebral infarction, unspecified Follow-up Appointments ppointment in 1 week. - Dr. Lady Gary - room 2 Return A Anesthetic (In  clinic) Topical Lidocaine 4% applied to wound bed Bathing/ Shower/ Hygiene May shower with protection but do not get wound dressing(s) wet. Protect dressing(s) with water repellant cover (for example, large plastic bag) or a cast  cover and may then take shower. Edema Control - Lymphedema / SCD / Other Elevate legs to the level of the heart or above for 30 minutes daily and/or when sitting for 3-4 times a day throughout the day. Avoid standing for long periods of time. Patient to wear own compression stockings every day. Moisturize legs daily. DAIMIEN, PATMON A ([id number]) [id number] Page 4 of 10 Off-Loading Total Contact Cast to Right Lower Extremity Removable cast walker boot to: - right leg Non Wound Condition Left Lower Extremity Other Non Wound Condition Orders/Instructions: - 3 layer wrap with silver alginate over blister Wound Treatment Wound #2 - Foot Wound Laterality: Plantar, Right Cleanser: Soap and Water 1 x Per Week/30 Days Discharge Instructions: May shower and wash wound with dial antibacterial soap and water prior to dressing change. Cleanser: Wound Cleanser 1 x Per Week/30 Days Discharge Instructions: Cleanse the wound with wound cleanser prior to applying a clean dressing using gauze sponges, not tissue or cotton balls. Prim Dressing: Hydrofera Blue Ready Transfer Foam, 2.5x2.5 (in/in) 1 x Per Week/30 Days ary Discharge Instructions: Apply directly to wound bed as directed Secondary Dressing: Woven Gauze Sponge, Non-Sterile 4x4 in 1 x Per Week/30 Days Discharge Instructions: Apply over primary dressing as directed. Secondary Dressing: Zetuvit Plus 4x8 in 1 x Per Week/30 Days Discharge Instructions: Apply over primary dressing as directed. Secured With: [employer], 4.5x3.1 (in/yd) 1 x Per Week/30 Days Discharge Instructions: Secure with Kerlix as directed. Wound #3 - Lower Leg Wound Laterality: Left, Medial Cleanser: Soap and Water 1 x  Per Week/30 Days Discharge Instructions: May shower and wash wound with dial antibacterial soap and water prior to dressing change. Cleanser: Vashe 5.8 (oz) 1 x Per Week/30 Days Discharge Instructions: Cleanse the wound with Vashe prior to applying a clean dressing using gauze sponges, not tissue or cotton balls. Peri-Wound Care: Sween Lotion (Moisturizing lotion) 1 x Per Week/30 Days Discharge Instructions: Apply moisturizing lotion as directed Prim Dressing: Maxorb Extra Ag+ Alginate Dressing, 2x2 (in/in) 1 x Per Week/30 Days ary Discharge Instructions: Apply to wound bed as instructed Secondary Dressing: ABD Pad, 5x9 1 x Per Week/30 Days Discharge Instructions: Apply over primary dressing as directed. Compression Wrap: Urgo K2 Lite, two layer compression system, regular 1 x Per Week/30 Days Discharge Instructions: Apply Urgo K2 Lite as directed (alternative to 3 layer compression). Patient Medications llergies: No Known Drug Allergies A Notifications Medication Indication Start End prior to debridement 11/05/2022 lidocaine DOSE topical 4 % cream - cream topical once daily Electronic Signature(s) Signed: 11/05/2022 12:09:09 PM By: Duanne Guess MD FACS Entered By: Duanne Guess on 11/05/2022 10:33:00 -------------------------------------------------------------------------------- Problem List Details Patient Name: Date of Service: [name], [name] 11/05/2022 9:30 A M Medical Record Number: [id number] Patient Account Number: [id number] Date of Birth/Sex: Treating RN: [birth year] (67 y.o. M) Primary Care Provider: Arva Chafe Other Clinician: Referring Provider: Treating Provider/Extender: [name] in Treatment: 9 Active Problems Herber, Malakhi A ([id number]) [id number] Page 5 of 10 ICD-10 Encounter Code Description Active Date MDM Diagnosis L97.512 Non-pressure chronic ulcer of other part of right foot with fat  layer exposed 08/31/2022 No Yes L97.821 Non-pressure chronic ulcer of other part of left lower leg limited to breakdown 11/05/2022 No Yes of skin I73.9 Peripheral vascular disease, unspecified 08/31/2022 No Yes I25.10 Atherosclerotic heart disease of native coronary artery without angina pectoris 08/31/2022 No Yes I10 Essential (primary) hypertension 08/31/2022 No Yes E11.65 Type 2 diabetes mellitus with hyperglycemia 08/31/2022 No Yes E11.40 Type  2 diabetes mellitus with diabetic neuropathy, unspecified 08/31/2022 No Yes I63.9 Cerebral infarction, unspecified 08/31/2022 No Yes Inactive Problems Resolved Problems Electronic Signature(s) Signed: 11/05/2022 10:28:42 AM By: Duanne Guess MD FACS Previous Signature: 11/05/2022 10:10:36 AM Version By: Duanne Guess MD FACS Entered By: Duanne Guess on 11/05/2022 10:28:42 -------------------------------------------------------------------------------- Progress Note Details Patient Name: Date of Service: [name], [name] 11/05/2022 9:30 A M Medical Record Number: [id number] Patient Account Number: [id number] Date of Birth/Sex: Treating RN: [birth year] (67 y.o. M) Primary Care Provider: Arva Chafe Other Clinician: Referring Provider: Treating Provider/Extender: [name] in Treatment: 9 Subjective Chief Complaint Information obtained from Patient 05/14/2020; patient is here for review of an abrasion injury on the left lateral calf 08/31/2022: [name] (1st metatarsal base, plantar) History of Present Illness (HPI) ADMISSION 05/14/2020; this is a 67 year old man with multiple medical issues. Predominantly he has type 2 diabetes with a history of peripheral neuropathy and also history of fairly significant PAD. He had a left superficial femoral to posterior tibial artery bypass in February 2017 he also had an atherectomy and angioplasty by Dr. [name] of the right popliteal artery in 2016. He is supposed  to be getting arterial studies annually however this was interrupted last year because of the pandemic. He tells [place] he was at [facility] 2 weeks ago was getting out of of the scooter and traumatized his left lateral lower leg. There was a lot of bleeding as the patient is on Plavix and Eliquis. They have been dressing this with Neosporin and doing a fairly good job. Wound measures 2.5 x 3.5 it does not have any depth he does not have a wound history in his legs outside of surgery however he does have chronic edema and skin changes suggestive of chronic venous disease possibly some degree of lymphedema as well. RAYWOOD, WAILES A ([id number]) [id number] Page 6 of 10 Past medical history includes type 2 diabetes with peripheral neuropathy and gait instability, lumbar spondylosis, obstructive sleep apnea, history of a left pontine CVA, basal cell skin cancer, atrial fibrillation on anticoagulation, significant PAD as noted with a left superficial femoral to posterior tibial bypass in February 2017 and a right popliteal atherectomy and angioplasty by Dr. [name] in 30th 2016. He also has a history of coronary artery disease with an MI in 2002 hypertension hyperlipidemia and heart failure with preserved ejection fraction His last arterial studies I can see in epic were on 03/10/2018 this showed a right ABI of 0.69 and a right TBI of 0.5 with monophasic waveforms on the right. On the left his ABI was 1.20 with a TBI of 0.92 and triphasic waveforms. He has not had arterial studies since. Our nurse in the clinic got an ABI on the left of 1.1 11/2; left anterior leg wound in the setting of chronic venous insufficiency. Wound was initially trauma. We have been using Hydrofera Blue under compression he has home health.. The wound looks a lot better today with improvement in surface area 11/16; left anterior leg wound in the setting of chronic venous insufficiency. Wound was initially trauma. We  have been using Hydrofera Blue under compression. The patient is closed today. He is supposed to follow-up with vein and vascular with regards to arterial insufficiency nevertheless his leg wounds are 12/3; apparently 2 weeks ago when they were putting on their stockings they managed to get 3 wounds on the left anterior lower leg from abrasion when putting on the stockings. Home health came by the week  of Thanksgiving and put Hydrofera Blue 4-layer wrap on this and there is only one superficial area remaining. The patient and his wife complained about the difficulties getting stockings on I think we are using 20/30. We will order bilateral external compression stockings which should be easier. 12/10; wound on the left anterior lower leg is closed. He has chronic venous insufficiency we ordered him Farrow wrap stockings unfortunately he did not bring these in. 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 He says that it has been present since mid December. He is currently residing in [facility] until March and a podiatrist has been looking after him there. They have been simply painting the area with Betadine. He has not had any lower extremity arterial studies since 2019, at which time his right ABI was 0.69. Measured in clinic today, it was 0.71. He is not aware of his most recent hemoglobin A1c, but historically he has had exceptionally poor control. On the basis of his right first metatarsal, there is a small crescent shaped wound. There is surrounding eschar and callus. There is no malodor or purulent drainage. 09/08/2022: The original wound is smaller today and fairly clean, but there is some discoloration and a pulpy texture to the adjacent callus. Underneath this, the tissue is open exposing the fat layer. It looks like perhaps there was a crack in the callus and moisture got under the skin and caused breakdown. 09/15/2022:  There has been more moisture related tissue breakdown. The callus is very soft and the underlying tissue is more open. 09/28/2022: The wound looks much better. He has done a good job keeping it dry. There is some callus overlying much of the wound surface. There is slough on the exposed open areas. 10/14/2022: There has been a lot of moisture related tissue breakdown. He is still forming callus over the top and then it seems that moisture gets underneath the callus and causes tissue damage. There is slough on the exposed wound surface. They will be moving back to the local area [place] the [place] this weekend. 10/21/2022: His foot is less wet, but there is no significant change to his wound. He has developed a blister on his left anterior tibial surface. There is no open wound here, but he does have some fairly significant edema. We are planning to apply a total contact cast today. 10/23/2022: Here for his obligatory first cast change. He says he has not had any issues wearing the cast or walking in the boot. No detrimental effects on his wound. 10/29/2022: The wound is looking better. Clearly, putting him in the cast has prevented water from getting in under his callus and causing further tissue breakdown. The blister on his left anterior tibial surface has not yet ruptured. Edema control is significantly improved. 11/05/2022: The wound on his right plantar foot is getting better. There is still some callus accumulation around the wounds, but there are just 2 open areas now, a very small 1 on the medial aspect of his metatarsal head and a little bit larger 1 on the plantar surface. The blister on his left anterior tibial surface is now open with hanging dry skin. Patient History Information obtained from Patient. Family History Unknown History. Social History Never smoker, Marital Status - Single, Alcohol Use - Never, Drug Use - No History, Caffeine Use - Rarely. Medical History Respiratory Patient has  history of Sleep Apnea Cardiovascular Patient has history of  Arrhythmia - A-Fib, Coronary Artery Disease - s/p CABG, Deep Vein Thrombosis, Hypertension, Myocardial Infarction, Peripheral Arterial Disease - s/p Fempop x 2 Endocrine Patient has history of Type II Diabetes Neurologic Patient has history of Neuropathy Hospitalization/Surgery History - colonoscopy. - polypectomy. - peripheral vascular cath. - shoulder arthroscopy. - carpal tunnel release. - coronary artery bypass. - appendectomy. - cardiac cath. - coronary angioplasty. - thrombectomy. - knee arthroplasty. - popliteal artery stent. 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 Bradycardic, asymptomatic. no acute distress. Vitals Time Taken: 9:23 AM, Height: 70 in, Weight: 260 lbs, BMI: 37.3, Temperature: 97.9 F, Pulse: 49 bpm, Respiratory Rate: 18 breaths/min, Blood Pressure: 178/75 mmHg. Respiratory Normal work of breathing on room air. General Notes: 11/05/2022: The wound on his right plantar foot is getting better. There is still some callus accumulation around the wounds, but there are just 2 open areas now, a very small 1 on the medial aspect of his metatarsal head and a little bit larger 1 on the plantar surface. The blister on his left anterior tibial surface is now open with hanging dry skin. Integumentary (Hair, Skin) Wound #2 status is Open. Original cause of wound was Gradually Appeared. The date acquired was: 07/08/2022. The wound has been in treatment 9 weeks. The wound is located on the Right,Plantar Foot. The wound measures 1cm length x 4.3cm width x 0.1cm depth; 3.377cm^2 area and 0.338cm^3 volume. There is Fat Layer (Subcutaneous Tissue) exposed. There is a medium amount of  serosanguineous drainage noted. The wound margin is distinct with the outline attached to the wound base. There is medium (34-66%) red granulation within the wound bed. There is a medium (34-66%) amount of necrotic tissue within the wound bed including Adherent Slough. The periwound skin appearance had no abnormalities noted for moisture. The periwound skin appearance had no abnormalities noted for color. The periwound skin appearance exhibited: Callus. Periwound temperature was noted as No Abnormality. Wound #3 status is Open. Original cause of wound was Blister. The date acquired was: 11/05/2022. The wound is located on the Left,Medial Lower Leg. The wound measures 3cm length x 2.5cm width x 0.1cm depth; 5.89cm^2 area and 0.589cm^3 volume. There is Fat Layer (Subcutaneous Tissue) exposed. There is no tunneling or undermining noted. There is a medium amount of serosanguineous drainage noted. There is no granulation within the wound bed. There is a large (67-100%) amount of necrotic tissue within the wound bed including Eschar and Adherent Slough. 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 There was a Selective/Open Wound Skin/Epidermis Debridement with a total area of 7.5 sq cm performed by Duanne Guess, MD. With the following instrument(s): Forceps,  and Scissors to remove Non-Viable tissue/material. Material removed includes Skin: Dermis and Skin: Epidermis and after achieving pain control using Lidocaine 4% Topical Solution. No specimens were taken. A time out was  conducted at 09:50, prior to the start of the procedure. A Minimum amount of bleeding was controlled with Pressure. The procedure was tolerated well with a pain level of 0 throughout and a pain level of 0 following the procedure. Post Debridement Measurements: 3cm length x 2.5cm width x 0.1cm depth; 0.589cm^3 volume. Character of Wound/Ulcer Post Debridement is improved. Severity of Tissue Post Debridement is: Fat layer exposed. Post procedure Diagnosis Wound #3: Same as Pre-Procedure General Notes: Scribed for Dr Lady Gary by Redmond Pulling, RN. Wound #2 Pre-procedure diagnosis of Wound #2 is a Diabetic Wound/Ulcer of the Lower Extremity located on the Right,Plantar Foot . There was a T Contact Cast otal Procedure by Duanne Guess, MD. Post procedure Diagnosis Wound #2: Same as Pre-Procedure Plan Follow-up Appointments: Return Appointment in 1 week. - Dr. Lady Gary - room 2 Anesthetic: SHADE, KALEY A ([id number]) [id number] Page 8 of 10 (In clinic) Topical Lidocaine 4% applied to wound bed Bathing/ Shower/ Hygiene: May shower with protection but do not get wound dressing(s) wet. Protect dressing(s) with water repellant cover (for example, large plastic bag) or a cast cover and may then take shower. Edema Control - Lymphedema / SCD / Other: Elevate legs to the level of the heart or above for 30 minutes daily and/or when sitting for 3-4 times a day throughout the day. Avoid standing for long periods of time. Patient to wear own compression stockings every day. Moisturize legs daily. Off-Loading: T Contact Cast to Right Lower Extremity otal Removable cast walker boot to: - right leg Non Wound Condition: Other Non Wound Condition Orders/Instructions:  - 3 layer wrap with silver alginate over blister The following medication(s) was prescribed: lidocaine topical 4 % cream cream topical once daily for prior to debridement was prescribed at facility WOUND #2: - Foot Wound Laterality: Plantar, Right Cleanser: Soap and Water 1 x Per Week/30 Days Discharge Instructions: May shower and wash wound with dial antibacterial soap and water prior to dressing change. Cleanser: Wound Cleanser 1 x Per Week/30 Days Discharge Instructions: Cleanse the wound with wound cleanser prior to applying a clean dressing using gauze sponges, not tissue or cotton balls. Prim Dressing: Hydrofera Blue Ready Transfer Foam, 2.5x2.5 (in/in) 1 x Per Week/30 Days ary Discharge Instructions: Apply directly to wound bed as directed Secondary Dressing: Woven Gauze Sponge, Non-Sterile 4x4 in 1 x Per Week/30 Days Discharge Instructions: Apply over primary dressing as directed. Secondary Dressing: Zetuvit Plus 4x8 in 1 x Per Week/30 Days Discharge Instructions: Apply over primary dressing as directed. Secured With: [employer], 4.5x3.1 (in/yd) 1 x Per Week/30 Days Discharge Instructions: Secure with Kerlix as directed. WOUND #3: - Lower Leg Wound Laterality: Left, Medial Cleanser: Soap and Water 1 x Per Week/30 Days Discharge Instructions: May shower and wash wound with dial antibacterial soap and water prior to dressing change. Cleanser: Vashe 5.8 (oz) 1 x Per Week/30 Days Discharge Instructions: Cleanse the wound with Vashe prior to applying a clean dressing using gauze sponges, not tissue or cotton balls. Peri-Wound Care: Sween Lotion (Moisturizing lotion) 1 x Per Week/30 Days Discharge Instructions: Apply moisturizing lotion as directed Prim Dressing: Maxorb Extra Ag+ Alginate Dressing, 2x2 (in/in) 1 x Per Week/30 Days ary Discharge Instructions: Apply to wound bed as instructed Secondary Dressing: ABD Pad, 5x9 1 x Per Week/30 Days Discharge Instructions: Apply over  primary dressing as directed. Com pression Wrap: Urgo K2 Lite, two layer compression system, regular 1 x Per Week/30 Days Discharge Instructions: Apply Urgo K2 Lite as directed (alternative  to 3 layer compression). 11/05/2022: The wound on his right plantar foot is getting better. There is still some callus accumulation around the wounds, but there are just 2 open areas now, a very small 1 on the medial aspect of his metatarsal head and a little bit larger 1 on the plantar surface. The blister on his left anterior tibial surface is now open with hanging dry skin. I used scissors and forceps to debride the dry skin from his left anterior tibial wound. We will apply silver alginate and 3 layer compression here. The right plantar foot wound was then prepared for total contact casting. Hydrofera Blue was applied to the surface and then a total contact cast was applied without complication or difficulty. He will follow-up in 1 week. Electronic Signature(s) Signed: 11/05/2022 10:33:44 AM By: Duanne Guess MD FACS Entered By: Duanne Guess on 11/05/2022 10:33:44 -------------------------------------------------------------------------------- HxROS Details Patient Name: Date of Service: [name], [name] 11/05/2022 9:30 A M Medical Record Number: [id number] Patient Account Number: [id number] Date of Birth/Sex: Treating RN: 10-14-1955 (67 y.o. M) Primary Care Provider: Arva Chafe Other Clinician: Referring Provider: Treating Provider/Extender: [name] in Treatment: 9 Information Obtained From Patient Respiratory Medical History: Positive for: Sleep Apnea Cardiovascular Medical History: Positive for: Arrhythmia - A-Fib; Coronary Artery Disease - s/p CABG; Deep Vein Thrombosis; Hypertension; Myocardial Infarction; Peripheral Arterial Disease - s/p Fempop x 2 Berges, Zubair A ([id number]) [id number] Page 9 of 10 Past Medical  History Notes: CVA x 3 Endocrine Medical History: Positive for: Type II Diabetes Time with diabetes: since 1997 Treated with: Insulin, Oral agents Blood sugar tested every day: Yes Tested : 2-3x per day Musculoskeletal Medical History: Past Medical History Notes: carpal tunnel syndrome Neurologic Medical History: Positive for: Neuropathy Past Medical History Notes: stroke Oncologic Medical History: Past Medical History Notes: skin cancer Immunizations Pneumococcal Vaccine: Received Pneumococcal Vaccination: Yes Received Pneumococcal Vaccination On or After 60th Birthday: Yes Implantable Devices None Hospitalization / Surgery History Type of Hospitalization/Surgery colonoscopy polypectomy peripheral vascular cath shoulder arthroscopy carpal tunnel release coronary artery bypass appendectomy cardiac cath coronary angioplasty thrombectomy knee arthroplasty popliteal artery stent Family and Social History Unknown History: Yes; Never smoker; Marital Status - Single; Alcohol Use: Never; Drug Use: No History; Caffeine Use: Rarely; Financial Concerns: No; Food, Clothing or Shelter Needs: No; Support System Lacking: No; Transportation Concerns: No Electronic Signature(s) Signed: 11/05/2022 12:09:09 PM By: Duanne Guess MD FACS Entered By: Duanne Guess on 11/05/2022 10:32:00 -------------------------------------------------------------------------------- Total Contact Cast Details Patient Name: Date of Service: [name] RK A. 11/05/2022 9:30 A M Medical Record Number: [id number] Patient Account Number: [id number] Date of Birth/Sex: Treating RN: [birth year] (67 y.o. Cline Cools Primary Care Provider: Arva Chafe Other Clinician: Referring Provider: Treating Provider/Extender: [name] in Treatment: 9 T Contact Cast Applied for Wound Assessment: otal Wound #2 Right,Plantar Foot Performed By: Physician Duanne Guess,  MD Esper, Cheskel A ([id number]) [id number] Page 10 of 10 Post Procedure Diagnosis Same as Pre-procedure Electronic Signature(s) Signed: 11/05/2022 12:09:09 PM By: Duanne Guess MD FACS Signed: 11/05/2022 3:58:17 PM By: Redmond Pulling RN, BSN Entered By: Redmond Pulling on 11/05/2022 09:55:55 -------------------------------------------------------------------------------- SuperBill Details Patient Name: Date of Service: [name], [name] 11/05/2022 Medical Record Number: [id number] Patient Account Number: [id number] Date of Birth/Sex: Treating RN: 1956-06-25 (67 y.o. M) Primary Care Provider: Arva Chafe Other Clinician: Referring Provider: Treating Provider/Extender: [name] in Treatment: 9 Diagnosis Coding ICD-10 Codes Code Description [phone] Non-pressure chronic ulcer  of other part of right foot with fat layer exposed L97.821 Non-pressure chronic ulcer of other part of left lower leg limited to breakdown of skin I73.9 Peripheral vascular disease, unspecified I25.10 Atherosclerotic heart disease of native coronary artery without angina pectoris I10 Essential (primary) hypertension E11.65 Type 2 diabetes mellitus with hyperglycemia E11.40 Type 2 diabetes mellitus with diabetic neuropathy, unspecified I63.9 Cerebral infarction, unspecified Facility Procedures : CPT4 Code: [id number] Description: 97597 - DEBRIDE WOUND 1ST 20 SQ CM OR < ICD-10 Diagnosis Description L97.821 Non-pressure chronic ulcer of other part of left lower leg limited to breakdown o Modifier: f skin Quantity: 1 : CPT4 Code: [id number] Description: 14782 - APPLY TOTAL CONTACT LEG CAST ICD-10 Diagnosis Description L97.512 Non-pressure chronic ulcer of other part of right foot with fat layer exposed Modifier: Quantity: 1 Physician Procedures : CPT4 Code Description Modifier [id number] 99214 - WC PHYS LEVEL 4 - EST PT 25 ICD-10 Diagnosis Description L97.821  Non-pressure chronic ulcer of other part of left lower leg limited to breakdown of skin L97.512 Non-pressure chronic ulcer of other part of  right foot with fat layer exposed E11.65 Type 2 diabetes mellitus with hyperglycemia Quantity: 1 : [id number] 97597 - WC PHYS DEBR WO ANESTH 20 SQ CM ICD-10 Diagnosis Description L97.821 Non-pressure chronic ulcer of other part of left lower leg limited to breakdown of skin Quantity: 1 : [id number] 29445 - WC PHYS APPLY TOTAL CONTACT CAST ICD-10 Diagnosis Description L97.512 Non-pressure chronic ulcer of other part of right foot with fat layer exposed Quantity: 1 Electronic Signature(s) Signed: 11/05/2022 10:34:34 AM By: Duanne Guess MD FACS Entered By: Duanne Guess on 11/05/2022 10:34:33

## 2022-11-06 NOTE — Progress Notes (Signed)
Dominique, Barlow A ([id number]) [id number] Page 1 of 8 Visit Report for 11/05/2022 Arrival Information Details Patient Name: Date of Service: [name] 11/05/2022 9:30 A M Medical Record Number: [id number] Patient Account Number: [id number] Date of Birth/Sex: Treating RN: May 23, 1956 (67 y.o. M) Primary Care Timi Reeser: Arva Chafe Other Clinician: Referring Ezri Landers: Treating Dianelly Ferran/Extender: [name] in Treatment: 9 Visit Information History Since Last Visit All ordered tests and consults were completed: No Patient Arrived: Ambulatory Added or deleted any medications: No Arrival Time: 09:23 Any new allergies or adverse reactions: No Accompanied By: wife Had a fall or experienced change in No Transfer Assistance: None activities of daily living that may affect Patient Identification Verified: Yes risk of falls: Secondary Verification Process Completed: Yes Signs or symptoms of abuse/neglect since last visito No Hospitalized since last visit: No Implantable device outside of the clinic excluding No cellular tissue based products placed in the center since last visit: Pain Present Now: No Electronic Signature(s) Signed: 11/05/2022 11:23:49 AM By: Dayton Scrape Entered By: Dayton Scrape on 11/05/2022 09:23:30 -------------------------------------------------------------------------------- Compression Therapy Details Patient Name: Date of Service: [place], [name] 11/05/2022 9:30 A M Medical Record Number: [id number] Patient Account Number: [id number] Date of Birth/Sex: Treating RN: [birth year] (67 y.o. Cline Cools Primary Care Jama Mcmiller: Arva Chafe Other Clinician: Referring Emeka Lindner: Treating Davien Malone/Extender: [name] in Treatment: 9 Compression Therapy Performed for Wound Assessment: Wound #3 Left,Medial Lower Leg Performed By: Clinician Redmond Pulling, RN Compression  Type: Three Layer Post Procedure Diagnosis Same as Pre-procedure Electronic Signature(s) Signed: 11/05/2022 3:58:17 PM By: Redmond Pulling RN, BSN Entered By: Redmond Pulling on 11/05/2022 11:28:58 -------------------------------------------------------------------------------- Encounter Discharge Information Details Patient Name: Date of Service: [name], [name] 11/05/2022 9:30 A M Medical Record Number: [id number] Patient Account Number: [id number] Date of Birth/Sex: Treating RN: 1956/05/02 (66 y.o. Cline Cools Primary Care Keani Gotcher: Arva Chafe Other Clinician: Referring Javonn Gauger: Treating Naphtali Riede/Extender: [name] in Treatment: 9 Encounter Discharge Information Items Post Procedure Vitals Discharge Condition: Stable Temperature (F): 97.9 Ambulatory Status: Ambulatory Pulse (bpm): 49 Discharge Destination: Home Respiratory Rate (breaths/min): 18 Transportation: Private Auto Blood Pressure (mmHg): 178/75 Accompanied By: wife Huebsch, Treson A ([id number]) [id number] Page 2 of 8 Schedule Follow-up Appointment: Yes Clinical Summary of Care: Patient Declined Electronic Signature(s) Signed: 11/05/2022 3:58:17 PM By: Redmond Pulling RN, BSN Entered By: Redmond Pulling on 11/05/2022 11:03:00 -------------------------------------------------------------------------------- Lower Extremity Assessment Details Patient Name: Date of Service: [name], [name] 11/05/2022 9:30 A M Medical Record Number: [id number] Patient Account Number: [id number] Date of Birth/Sex: Treating RN: 1956-07-16 (67 y.o. Cline Cools Primary Care Melenda Bielak: Arva Chafe Other Clinician: Referring Gisselle Galvis: Treating Aster Eckrich/Extender: [name] in Treatment: 9 Edema Assessment Assessed: [name]: No] [Right: No] [Left: Edema] [Right: :] Calf Left: Right: Point of Measurement: From Medial Instep 40 cm 38.5  cm Ankle Left: Right: Point of Measurement: From Medial Instep 24.5 cm 27.5 cm Vascular Assessment Pulses: Dorsalis Pedis Palpable: [Left:Yes] [Right:Yes] Electronic Signature(s) Signed: 11/05/2022 3:58:17 PM By: Redmond Pulling RN, BSN Entered By: Redmond Pulling on 11/05/2022 09:42:12 -------------------------------------------------------------------------------- Multi Wound Chart Details Patient Name: Date of Service: [name], [name] 11/05/2022 9:30 A M Medical Record Number: [id number] Patient Account Number: [id number] Date of Birth/Sex: Treating RN: 05/10/1956 (67 y.o. M) Primary Care Janyce Ellinger: Arva Chafe Other Clinician: Referring Apryll Hinkle: Treating Joliene Salvador/Extender: [name] in Treatment: 9 Vital Signs Height(in): 70 Pulse(bpm): 49 Weight(lbs): 260 Blood Pressure(mmHg): 178/75 Body  Mass Index(BMI): 37.3 Temperature(F): 97.9 Respiratory Rate(breaths/min): 18 [2:Photos: No Photos Right, Plantar Foot Wound Location: Gradually Appeared Wounding Event: Diabetic Wound/Ulcer of the Lower Primary Etiology: Extremity Sleep Apnea, Arrhythmia, Coronary Comorbid History: Artery Disease, Deep Vein Thrombosis, Hypertension,  Myocardial Infarction, Peripheral Arterial Disease,] [3:No Photos Left, Medial Lower Leg Blister Diabetic Wound/Ulcer of the Lower Extremity Sleep Apnea, Arrhythmia, Coronary Artery Disease, Deep Vein Thrombosis, Hypertension, Myocardial Infarction,  Peripheral Arterial Disease,] [N/A:N/A N/A N/A N/A N/A] Whichard, Tajon A ([id number]) [2:Type II Diabetes, Neuropathy 07/08/2022 Date Acquired: 9 Weeks of Treatment: Open Wound Status: No Wound Recurrence: 1x4.3x0.1 Measurements L x W x D (cm) 3.377 A (cm) : rea 0.338 Volume (cm) : -32.30% % Reduction in A rea: -32.50% % Reduction in  Volume: Grade 1 Classification: Medium Exudate A mount: Serosanguineous Exudate Type: red, brown Exudate Color: Distinct, outline attached Wound Margin:  Medium (34-66%) Granulation A mount: Red Granulation Quality: Medium (34-66%) Necrotic A mount:  Adherent Slough Necrotic Tissue: Fat Layer (Subcutaneous Tissue): Yes Fat Layer (Subcutaneous Tissue): Yes N/A Exposed Structures: Fascia: No Tendon: No Muscle: No Joint: No Bone: No Medium (34-66%) Epithelialization: N/A Debridement: Pre-procedure  Verification/Time Out N/A Taken: N/A Pain Control: N/A Level: N/A Debridement A (sq cm): rea N/A Instrument: N/A Bleeding: N/A Hemostasis A chieved: N/A Procedural Pain: N/A Post Procedural Pain: N/A Debridement Treatment Response: N/A Post Debridement  Measurements L x W x D (cm) N/A Post Debridement Volume: (cm) Callus: Yes Periwound Skin Texture: No Abnormalities Noted Periwound Skin Moisture: No Abnormalities Noted Periwound Skin Color: No Abnormality Temperature: T Contact Cast otal Procedures  Performed:] [3:Type II Diabetes, Neuropathy 11/05/2022 0 Open No 3x2.5x0.1 5.89 0.589 N/A N/A Grade 2 Medium Serosanguineous red, brown N/A None Present (0%) N/A Large (67-100%) Eschar, Adherent Slough Fascia: No Tendon: No Muscle: No Joint: No Bone: No  None Debridement - Selective/Open Wound N/A 09:50 Lidocaine 4% Topical Solution Skin/Epidermis 7.5 Forceps, Scissors Minimum Pressure 0 0 Procedure was tolerated well 3x2.5x0.1 0.589 N/A Debridement] [N/A:[id number] Page 3 of 8  N/A N/A N/A N/A N/A N/A N/A N/A N/A N/A N/A N/A N/A N/A N/A N/A N/A N/A N/A N/A N/A N/A N/A N/A N/A N/A N/A N/A N/A N/A N/A N/A N/A N/A N/A N/A] Treatment Notes Electronic Signature(s) Signed: 11/05/2022 10:29:21 AM By: Duanne Guess MD FACS Entered By: Duanne Guess on 11/05/2022 10:29:20 -------------------------------------------------------------------------------- Multi-Disciplinary Care Plan Details Patient Name: Date of Service: [name], [name] 11/05/2022 9:30 A M Medical Record Number: [id number] Patient Account Number: [id number] Date of Birth/Sex:  Treating RN: 1956/05/29 (67 y.o. Cline Cools Primary Care Carmalita Wakefield: Arva Chafe Other Clinician: Referring Tamim Skog: Treating Yanitza Shvartsman/Extender: [name] in Treatment: 9 Active Inactive Abuse / Safety / Falls / Self Care Management Nursing Diagnoses: Impaired physical mobility Potential for falls Goals: Patient will not experience any injury related to falls Date Initiated: 08/31/2022 Target Resolution Date: 12/18/2022 Goal Status: Active LEVERN, PITTER A ([id number]) [id number] Page 4 of 8 Patient/caregiver will verbalize/demonstrate measures taken to improve the patient's personal safety Date Initiated: 08/31/2022 Target Resolution Date: 01/15/2023 Goal Status: Active Interventions: Provide education on basic hygiene Provide education on fall prevention Notes: Wound/Skin Impairment Nursing Diagnoses: Impaired tissue integrity Knowledge deficit related to ulceration/compromised skin integrity Goals: Patient/caregiver will verbalize understanding of skin care regimen Date Initiated: 08/31/2022 Target Resolution Date: 12/19/2022 Goal Status: Active Interventions: Assess patient/caregiver ability to obtain necessary supplies Assess patient/caregiver ability to perform ulcer/skin care regimen upon admission and as needed Assess ulceration(s) every visit  Treatment Activities: Skin care regimen initiated : 08/31/2022 Topical wound management initiated : 08/31/2022 Notes: Electronic Signature(s) Signed: 11/05/2022 3:58:17 PM By: Redmond Pulling RN, BSN Entered By: Redmond Pulling on 11/05/2022 09:53:15 -------------------------------------------------------------------------------- Pain Assessment Details Patient Name: Date of Service: [name], [name] 11/05/2022 9:30 A M Medical Record Number: [id number] Patient Account Number: [id number] Date of Birth/Sex: Treating RN: 15-Dec-1955 (67 y.o. M) Primary Care Svara Twyman:  Arva Chafe Other Clinician: Referring Lahari Suttles: Treating Alisah Grandberry/Extender: [name] in Treatment: 9 Active Problems Location of Pain Severity and Description of Pain Patient Has Paino No Site Locations Pain Management and Medication Current Pain Management: Symonette, Yer A ([id number]) [id number] Page 5 of 8 Electronic Signature(s) Signed: 11/05/2022 11:23:49 AM By: Dayton Scrape Entered By: Dayton Scrape on 11/05/2022 09:24:22 -------------------------------------------------------------------------------- Patient/Caregiver Education Details Patient Name: Date of Service: [name] RK A. 4/18/2024andnbsp9:30 A M Medical Record Number: [id number] Patient Account Number: [id number] Date of Birth/Gender: Treating RN: [birth year] (67 y.o. Cline Cools Primary Care Physician: Arva Chafe Other Clinician: Referring Physician: Treating Physician/Extender: [name] in Treatment: 9 Education Assessment Education Provided To: Patient Education Topics Provided Wound/Skin Impairment: Methods: Explain/Verbal Responses: State content correctly [employer]) Signed: 11/05/2022 3:58:17 PM By: Redmond Pulling RN, BSN Entered By: Redmond Pulling on 11/05/2022 09:53:30 -------------------------------------------------------------------------------- Wound Assessment Details Patient Name: Date of Service: [name], [name] 11/05/2022 9:30 A M Medical Record Number: [id number] Patient Account Number: [id number] Date of Birth/Sex: Treating RN: 01-19-1956 (67 y.o. Cline Cools Primary Care Biance Moncrief: Arva Chafe Other Clinician: Referring Jyrah Blye: Treating Ladainian Therien/Extender: [name] in Treatment: 9 Wound Status Wound Number: 2 Primary Diabetic Wound/Ulcer of the Lower Extremity Etiology: Wound Location: Right, Plantar Foot Wound  Open Wounding Event: Gradually Appeared Status: Date Acquired: 07/08/2022 Comorbid Sleep Apnea, Arrhythmia, Coronary Artery Disease, Deep Vein Weeks Of Treatment: 9 History: Thrombosis, Hypertension, Myocardial Infarction, Peripheral Arterial Clustered Wound: No Disease, Type II Diabetes, Neuropathy Wound Measurements Length: (cm) 1 Width: (cm) 4.3 Depth: (cm) 0.1 Area: (cm) 3.377 Volume: (cm) 0.338 % Reduction in Area: -32.3% % Reduction in Volume: -32.5% Epithelialization: Medium (34-66%) Wound Description Classification: Grade 1 Wound Margin: Distinct, outline attached Exudate Amount: Medium Exudate Type: Serosanguineous Exudate Color: red, brown Foul Odor After Cleansing: No Slough/Fibrino Yes Wound Bed Granulation Amount: Medium (34-66%) Exposed Structure Granulation Quality: Red Fascia Exposed: No Necrotic Amount: Medium (34-66%) Fat Layer (Subcutaneous Tissue) Exposed: Yes Necrotic Quality: Adherent Slough Tendon Exposed: No Muscle Exposed: No Akhavan, Grahm A ([id number]) [id number] Page 6 of 8 Joint Exposed: No Bone Exposed: No Periwound Skin Texture Texture Color No Abnormalities Noted: No No Abnormalities Noted: Yes Callus: Yes Temperature / Pain Temperature: No Abnormality Moisture No Abnormalities Noted: Yes Treatment Notes Wound #2 (Foot) Wound Laterality: Plantar, Right Cleanser Soap and Water Discharge Instruction: May shower and wash wound with dial antibacterial soap and water prior to dressing change. Wound Cleanser Discharge Instruction: Cleanse the wound with wound cleanser prior to applying a clean dressing using gauze sponges, not tissue or cotton balls. Peri-Wound Care Topical Primary Dressing Hydrofera Blue Ready Transfer Foam, 2.5x2.5 (in/in) Discharge Instruction: Apply directly to wound bed as directed Secondary Dressing Woven Gauze Sponge, Non-Sterile 4x4 in Discharge Instruction: Apply over primary  dressing as directed. Zetuvit Plus 4x8 in Discharge Instruction: Apply over primary dressing as directed. Secured With [employer], 4.5x3.1 (in/yd) Discharge Instruction: Secure with Kerlix as directed. Compression Wrap Compression Stockings Add-Ons Electronic Signature(s) Signed: 11/05/2022 3:58:17 PM By: Redmond Pulling  RN, BSN Entered By: Redmond Pulling on 11/05/2022 09:40:35 -------------------------------------------------------------------------------- Wound Assessment Details Patient Name: Date of Service: KEATIN, BENHAM A. 11/05/2022 9:30 A M Medical Record Number: [id number] Patient Account Number: [id number] Date of Birth/Sex: Treating RN: [birth year] (67 y.o. Cline Cools Primary Care Perla Echavarria: Arva Chafe Other Clinician: Referring Jeaneen Cala: Treating Aubria Vanecek/Extender: [name] in Treatment: 9 Wound Status Wound Number: 3 Primary Diabetic Wound/Ulcer of the Lower Extremity Etiology: Wound Location: Left, Medial Lower Leg Wound Open Wounding Event: Blister Status: Date Acquired: 11/05/2022 Comorbid Sleep Apnea, Arrhythmia, Coronary Artery Disease, Deep Vein Weeks Of Treatment: 0 History: Thrombosis, Hypertension, Myocardial Infarction, Peripheral Arterial Clustered Wound: No Disease, Type II Diabetes, Neuropathy Wound Measurements Length: (cm) 3 Width: (cm) 2.5 Depth: (cm) 0.1 Area: (cm) 5.89 Augustine, Chaysen A ([id number]) Volume: (cm) 0.589 % Reduction in Area: % Reduction in Volume: Epithelialization: None Tunneling: No [id number] Page 7 of 8 Undermining: No Wound Description Classification: Grade 2 Exudate Amount: Medium Exudate Type: Serosanguineous Exudate Color: red, brown Foul Odor After Cleansing: No Slough/Fibrino Yes Wound Bed Granulation Amount: None Present (0%) Exposed Structure Necrotic Amount: Large (67-100%) Fascia Exposed: No Necrotic Quality: Eschar, Adherent  Slough Fat Layer (Subcutaneous Tissue) Exposed: Yes Tendon Exposed: No Muscle Exposed: No Joint Exposed: No Bone Exposed: No Periwound Skin Texture Texture Color No Abnormalities Noted: No No Abnormalities Noted: No Moisture No Abnormalities Noted: No Treatment Notes Wound #3 (Lower Leg) Wound Laterality: Left, Medial Cleanser Soap and Water Discharge Instruction: May shower and wash wound with dial antibacterial soap and water prior to dressing change. Vashe 5.8 (oz) Discharge Instruction: Cleanse the wound with Vashe prior to applying a clean dressing using gauze sponges, not tissue or cotton balls. Peri-Wound Care Sween Lotion (Moisturizing lotion) Discharge Instruction: Apply moisturizing lotion as directed Topical Primary Dressing Maxorb Extra Ag+ Alginate Dressing, 2x2 (in/in) Discharge Instruction: Apply to wound bed as instructed Secondary Dressing ABD Pad, 5x9 Discharge Instruction: Apply over primary dressing as directed. Secured With Compression Wrap Urgo K2 Lite, two layer compression system, regular Discharge Instruction: Apply Urgo K2 Lite as directed (alternative to 3 layer compression). Compression Stockings Add-Ons Electronic Signature(s) Signed: 11/05/2022 3:58:17 PM By: Redmond Pulling RN, BSN Entered By: Redmond Pulling on 11/05/2022 09:47:34 -------------------------------------------------------------------------------- Vitals Details Patient Name: Date of Service: [name], [name] 11/05/2022 9:30 A M Medical Record Number: [id number] Patient Account Number: [id number] Date of Birth/Sex: Treating RN: 05-02-1956 (67 y.o. M) Primary Care Laquon Emel: Arva Chafe Other Clinician: Referring Kla Bily: Treating Orrin Yurkovich/Extender: [name] in Treatment: 9 Vital Signs Dickenson, Vikash A ([id number]) [id number] Page 8 of 8 Time Taken: 09:23 Temperature (F): 97.9 Height (in): 70 Pulse (bpm):  49 Weight (lbs): 260 Respiratory Rate (breaths/min): 18 Body Mass Index (BMI): 37.3 Blood Pressure (mmHg): 178/75 Reference Range: 80 - 120 mg / dl Electronic Signature(s) Signed: 11/05/2022 11:23:49 AM By: Dayton Scrape Entered By: Dayton Scrape on 11/05/2022 09:23:58

## 2022-11-12 ENCOUNTER — Encounter (HOSPITAL_BASED_OUTPATIENT_CLINIC_OR_DEPARTMENT_OTHER): Payer: Medicare HMO | Admitting: General Surgery

## 2022-11-12 DIAGNOSIS — I5032 Chronic diastolic (congestive) heart failure: Secondary | ICD-10-CM | POA: Diagnosis not present

## 2022-11-12 DIAGNOSIS — E11621 Type 2 diabetes mellitus with foot ulcer: Secondary | ICD-10-CM | POA: Diagnosis not present

## 2022-11-12 DIAGNOSIS — E1151 Type 2 diabetes mellitus with diabetic peripheral angiopathy without gangrene: Secondary | ICD-10-CM | POA: Diagnosis not present

## 2022-11-12 DIAGNOSIS — Z8673 Personal history of transient ischemic attack (TIA), and cerebral infarction without residual deficits: Secondary | ICD-10-CM | POA: Diagnosis not present

## 2022-11-12 DIAGNOSIS — E1140 Type 2 diabetes mellitus with diabetic neuropathy, unspecified: Secondary | ICD-10-CM | POA: Diagnosis not present

## 2022-11-12 DIAGNOSIS — L97512 Non-pressure chronic ulcer of other part of right foot with fat layer exposed: Secondary | ICD-10-CM | POA: Diagnosis not present

## 2022-11-12 DIAGNOSIS — I2510 Atherosclerotic heart disease of native coronary artery without angina pectoris: Secondary | ICD-10-CM | POA: Diagnosis not present

## 2022-11-12 DIAGNOSIS — I110 Hypertensive heart disease with heart failure: Secondary | ICD-10-CM | POA: Diagnosis not present

## 2022-11-12 DIAGNOSIS — L97821 Non-pressure chronic ulcer of other part of left lower leg limited to breakdown of skin: Secondary | ICD-10-CM | POA: Diagnosis not present

## 2022-11-12 DIAGNOSIS — H25813 Combined forms of age-related cataract, bilateral: Secondary | ICD-10-CM | POA: Diagnosis not present

## 2022-11-12 DIAGNOSIS — E113293 Type 2 diabetes mellitus with mild nonproliferative diabetic retinopathy without macular edema, bilateral: Secondary | ICD-10-CM | POA: Diagnosis not present

## 2022-11-12 LAB — HM DIABETES EYE EXAM

## 2022-11-12 NOTE — Progress Notes (Addendum)
[name] ([id number]) [id number] Page 1 of 9 Visit Report for 11/12/2022 Chief Complaint Document Details Patient Name: Date of Service: [name]. 11/12/2022 8:15 [name] Medical Record Number: [id number] Patient Account Number: [id number] Date of Birth/Sex: Treating RN: 08-19-1955 (67 y.o. M) Primary Care Provider: Arva Chafe Other Clinician: Referring Provider: Treating Provider/Extender: [name] in Treatment: 10 Information Obtained from: Patient Chief Complaint 05/14/2020; patient is here for review of an abrasion injury on the left lateral calf 08/31/2022: [name] (1st metatarsal base, plantar) Electronic Signature(s) Signed: 11/12/2022 8:36:32 AM By: Duanne Guess MD FACS Entered By: Duanne Guess on 11/12/2022 08:36:31 -------------------------------------------------------------------------------- HPI Details Patient Name: Date of Service: [name], [name]. 11/12/2022 8:15 [name] Medical Record Number: [id number] Patient Account Number: [id number] Date of Birth/Sex: Treating RN: 10-Sep-1955 (67 y.o. M) Primary Care Provider: Arva Chafe Other Clinician: Referring Provider: Treating Provider/Extender: [name] in Treatment: 10 History of Present Illness HPI Description: ADMISSION 05/14/2020; this is [name] with multiple medical issues. Predominantly he has type 2 diabetes with [name] and also history of fairly significant PAD. He had [name] in February 2017 he also had an atherectomy and angioplasty by Dr. Allyson [name] of the right popliteal artery in 2016. He is supposed to be getting arterial studies annually however this was interrupted last year because of the pandemic. He tells [place] he was at [facility] 2 weeks ago was getting out of of the scooter and traumatized his left  lateral lower leg. There was [name] as the patient is on Plavix and Eliquis. They have been dressing this with Neosporin and doing [name] fairly good job. Wound measures 2.5 x 3.5 it does not have any depth he does not have [name] in his legs outside of surgery however he does have chronic edema and skin changes suggestive of chronic venous disease possibly some degree of lymphedema as well. Past medical history includes type 2 diabetes with peripheral neuropathy and gait instability, lumbar spondylosis, obstructive sleep apnea, history of [name], basal cell skin cancer, atrial fibrillation on anticoagulation, significant PAD as noted with [name] and [name] by Dr. Allyson [name] in 30th 2016. He also has [name] of coronary artery disease with an MI in 2002 hypertension hyperlipidemia and heart failure with preserved ejection fraction His last arterial studies I can see in epic were on 03/10/2018 this showed [name] and [name] with monophasic waveforms on the right. On the left his ABI was 1.20 with [name] and triphasic waveforms. He has not had arterial studies since. Our nurse in the clinic got an ABI on the left of 1.1 11/2; left anterior leg wound in the setting of chronic venous insufficiency. Wound was initially trauma. We have been using Hydrofera Blue under compression he has home health.. The wound looks [name] lot better today with improvement in surface area 11/16; left anterior leg wound in the setting of chronic venous insufficiency. Wound was initially trauma. We have been using Hydrofera Blue under compression. The patient is closed today. He is supposed to follow-up with vein and vascular with regards to arterial insufficiency nevertheless his leg wounds are 12/3; apparently 2 weeks ago when they were putting on their stockings they managed  to get 3 wounds on the left anterior lower leg from abrasion when putting on the stockings. Home health came by the week of Thanksgiving and put Hydrofera Blue 4-layer wrap on this and there is only one superficial area remaining. The patient and his wife complained about the difficulties getting stockings on I think we are using 20/30. We will order bilateral external compression stockings which should be easier. 12/10; wound on the left anterior lower leg is closed. He has chronic venous insufficiency we ordered him Farrow wrap stockings unfortunately he did not bring these in. 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 He says that it has been present since mid December. He is currently residing in [facility] until March and [name] podiatrist has been looking after him there. They have been simply painting the area with Betadine. He has not had any lower extremity arterial studies since 2019, at which time his right ABI was 0.69. Measured in clinic today, it was 0.71. He is not aware of his most recent hemoglobin A1c, but historically he has had exceptionally poor control. On the basis of his right first metatarsal, there is [name] small crescent shaped wound. There is surrounding eschar and callus. There is no malodor or purulent [name], [name] ([id number]) [phone].pdf Page 2 of 9 drainage. 09/08/2022: The original wound is smaller today and fairly clean, but there is some discoloration and [name] pulpy texture to the adjacent callus. Underneath this, the tissue is open exposing the fat layer. It looks like perhaps there was [name] crack in the callus and moisture got under the skin and caused breakdown. 09/15/2022: There has been more moisture related tissue breakdown. The callus is very soft and the underlying tissue is more open. 09/28/2022: The wound looks much better. He has done [name] good job keeping it dry. There is some  callus overlying much of the wound surface. There is slough on the exposed open areas. 10/14/2022: There has been [name] lot of moisture related tissue breakdown. He is still forming callus over the top and then it seems that moisture gets underneath the callus and causes tissue damage. There is slough on the exposed wound surface. They will be moving back to the local area from the beach this weekend. 10/21/2022: His foot is less wet, but there is no significant change to his wound. He has developed [name] blister on his left anterior tibial surface. There is no open wound here, but he does have some fairly significant edema. We are planning to apply [name] total contact cast today. 10/23/2022: Here for his obligatory first cast change. He says he has not had any issues wearing the cast or walking in the boot. No detrimental effects on his wound. 10/29/2022: The wound is looking better. Clearly, putting him in the cast has prevented water from getting in under his callus and causing further tissue breakdown. The blister on his left anterior tibial surface has not yet ruptured. Edema control is significantly improved. 11/05/2022: The wound on his right plantar foot is getting better. There is still some callus accumulation around the wounds, but there are just 2 open areas now, [name] very small 1 on the medial aspect of his metatarsal head and [name] little bit larger 1 on the plantar surface. The blister on his left anterior tibial surface is now open with hanging dry skin. 11/12/2022: The left anterior tibial wound is closed. The wound on his right plantar  foot is smaller with just [name] little bit of callus around the edges. Electronic Signature(s) Signed: 11/12/2022 8:41:56 AM By: Duanne Guess MD FACS Entered By: Duanne Guess on 11/12/2022 08:41:56 -------------------------------------------------------------------------------- Physical Exam Details Patient Name: Date of Service: [name], [name]. 11/12/2022 8:15 [name]  M Medical Record Number: [id number] Patient Account Number: [id number] Date of Birth/Sex: Treating RN: [birth year] (67 y.o. M) Primary Care Provider: Arva Chafe Other Clinician: Referring Provider: Treating Provider/Extender: [name] in Treatment: 10 Constitutional Hypertensive, asymptomatic. Bradycardic, asymptomatic. . . no acute distress. Respiratory Normal work of breathing on room air. Notes 11/12/2022: The left anterior tibial wound is closed. The wound on his right plantar foot is smaller with just [name] little bit of callus around the edges. Electronic Signature(s) Signed: 11/12/2022 8:42:38 AM By: Duanne Guess MD FACS Entered By: Duanne Guess on 11/12/2022 08:42:38 -------------------------------------------------------------------------------- Physician Orders Details Patient Name: Date of Service: [name], [name]. 11/12/2022 8:15 [name] Medical Record Number: [id number] Patient Account Number: [id number] Date of Birth/Sex: Treating RN: 10-Mar-1956 (67 y.o. Valma Cava Primary Care Provider: Arva Chafe Other Clinician: Referring Provider: Treating Provider/Extender: [name] in Treatment: 10 Verbal / Phone Orders: No Diagnosis Coding ICD-10 Coding Code Description [phone] Non-pressure chronic ulcer of other part of right foot with fat layer exposed I73.9 Peripheral vascular disease, unspecified I25.10 Atherosclerotic heart disease of native coronary artery without angina pectoris Crehan, Corleone [name] ([id number]) [phone].pdf Page 3 of 9 I10 Essential (primary) hypertension E11.65 Type 2 diabetes mellitus with hyperglycemia E11.40 Type 2 diabetes mellitus with diabetic neuropathy, unspecified I63.9 Cerebral infarction, unspecified Follow-up Appointments ppointment in 1 week. - Dr. Lady Gary - room 2 Return [name] Anesthetic (In clinic) Topical Lidocaine 4% applied to wound  bed Bathing/ Shower/ Hygiene May shower with protection but do not get wound dressing(s) wet. Protect dressing(s) with water repellant cover (for example, large plastic bag) or [name] cast cover and may then take shower. Edema Control - Lymphedema / SCD / Other Elevate legs to the level of the heart or above for 30 minutes daily and/or when sitting for 3-4 times [name] day throughout the day. Avoid standing for long periods of time. Patient to wear own compression stockings every day. Moisturize legs daily. Off-Loading Total Contact Cast to Right Lower Extremity Removable cast walker boot to: - right leg Non Wound Condition Left Lower Extremity Other Non Wound Condition Orders/Instructions: - 3 layer wrap with silver alginate over blister Wound Treatment Wound #2 - Foot Wound Laterality: Plantar, Right Cleanser: Soap and Water 1 x Per Week/30 Days Discharge Instructions: May shower and wash wound with dial antibacterial soap and water prior to dressing change. Cleanser: Wound Cleanser 1 x Per Week/30 Days Discharge Instructions: Cleanse the wound with wound cleanser prior to applying [name] clean dressing using gauze sponges, not tissue or cotton balls. Prim Dressing: Hydrofera Blue Ready Transfer Foam, 2.5x2.5 (in/in) 1 x Per Week/30 Days ary Discharge Instructions: Apply directly to wound bed as directed Secondary Dressing: Woven Gauze Sponge, Non-Sterile 4x4 in 1 x Per Week/30 Days Discharge Instructions: Apply over primary dressing as directed. Secondary Dressing: Zetuvit Plus 4x8 in 1 x Per Week/30 Days Discharge Instructions: Apply over primary dressing as directed. Secured With: [employer], 4.5x3.1 (in/yd) 1 x Per Week/30 Days Discharge Instructions: Secure with Kerlix as directed. Electronic Signature(s) Signed: 11/12/2022 4:24:20 PM By: Duanne Guess MD FACS Signed: 11/19/2022 2:05:17 PM By: Tommie Ard RN Previous Signature: 11/12/2022 8:42:47 AM Version By: Lady Gary  Victorino Dike MD  FACS Entered By: Tommie Ard on 11/12/2022 08:48:20 -------------------------------------------------------------------------------- Problem List Details Patient Name: Date of Service: [name], [name]. 11/12/2022 8:15 [name] Medical Record Number: [id number] Patient Account Number: [id number] Date of Birth/Sex: Treating RN: 09/29/1955 (67 y.o. M) Primary Care Provider: Arva Chafe Other Clinician: Referring Provider: Treating Provider/Extender: [name] in Treatment: 10 Active Problems ICD-10 Encounter Code Description Active Date MDM Diagnosis L97.512 Non-pressure chronic ulcer of other part of right foot with fat layer exposed 08/31/2022 No Yes [name], [name] ([id number]) [phone].pdf Page 4 of 9 I73.9 Peripheral vascular disease, unspecified 08/31/2022 No Yes I25.10 Atherosclerotic heart disease of native coronary artery without angina pectoris 08/31/2022 No Yes I10 Essential (primary) hypertension 08/31/2022 No Yes E11.65 Type 2 diabetes mellitus with hyperglycemia 08/31/2022 No Yes E11.40 Type 2 diabetes mellitus with diabetic neuropathy, unspecified 08/31/2022 No Yes I63.9 Cerebral infarction, unspecified 08/31/2022 No Yes Inactive Problems Resolved Problems ICD-10 Code Description Active Date Resolved Date L97.821 Non-pressure chronic ulcer of other part of left lower leg limited to breakdown of skin 11/05/2022 11/05/2022 Electronic Signature(s) Signed: 11/12/2022 8:36:04 AM By: Duanne Guess MD FACS Entered By: Duanne Guess on 11/12/2022 08:36:04 -------------------------------------------------------------------------------- Progress Note Details Patient Name: Date of Service: [name], [name]. 11/12/2022 8:15 [name] Medical Record Number: [id number] Patient Account Number: [id number] Date of Birth/Sex: Treating RN: 03/22/1956 (67 y.o. M) Primary Care Provider: Arva Chafe Other Clinician: Referring  Provider: Treating Provider/Extender: [name] in Treatment: 10 Subjective Chief Complaint Information obtained from Patient 05/14/2020; patient is here for review of an abrasion injury on the left lateral calf 08/31/2022: [name] (1st metatarsal base, plantar) History of Present Illness (HPI) ADMISSION 05/14/2020; this is [name] with multiple medical issues. Predominantly he has type 2 diabetes with [name] and also history of fairly significant PAD. He had [name] in February 2017 he also had an atherectomy and angioplasty by Dr. Allyson [name] of the right popliteal artery in 2016. He is supposed to be getting arterial studies annually however this was interrupted last year because of the pandemic. He tells [place] he was at [facility] 2 weeks ago was getting out of of the scooter and traumatized his left lateral lower leg. There was [name] as the patient is on Plavix and Eliquis. They have been dressing this with Neosporin and doing [name] fairly good job. Wound measures 2.5 x 3.5 it does not have any depth he does not have [name] in his legs outside of surgery however he does have chronic edema and skin changes suggestive of chronic venous disease possibly some degree of lymphedema as well. Past medical history includes type 2 diabetes with peripheral neuropathy and gait instability, lumbar spondylosis, obstructive sleep apnea, history of [name], basal cell skin cancer, atrial fibrillation on anticoagulation, significant PAD as noted with [name] and [name] by Dr. Allyson [name] in 30th 2016. He also has [name] of coronary artery disease with an MI in 2002 hypertension hyperlipidemia and heart failure with preserved ejection fraction His last arterial  studies I can see in epic were on 03/10/2018 this showed [name] and [name] with monophasic waveforms on the right. On MAMORU, HERRIG [name] ([id number]) [id number] Page 5 of 9 the left his ABI  was 1.20 with [name] and triphasic waveforms. He has not had arterial studies since. Our nurse in the clinic got an ABI on the left of 1.1 11/2; left anterior leg wound in the setting of chronic venous insufficiency. Wound was initially trauma. We have been using Hydrofera Blue under compression he has home health.. The wound looks [name] lot better today with improvement in surface area 11/16; left anterior leg wound in the setting of chronic venous insufficiency. Wound was initially trauma. We have been using Hydrofera Blue under compression. The patient is closed today. He is supposed to follow-up with vein and vascular with regards to arterial insufficiency nevertheless his leg wounds are 12/3; apparently 2 weeks ago when they were putting on their stockings they managed to get 3 wounds on the left anterior lower leg from abrasion when putting on the stockings. Home health came by the week of Thanksgiving and put Hydrofera Blue 4-layer wrap on this and there is only one superficial area remaining. The patient and his wife complained about the difficulties getting stockings on I think we are using 20/30. We will order bilateral external compression stockings which should be easier. 12/10; wound on the left anterior lower leg is closed. He has chronic venous insufficiency we ordered him Farrow wrap stockings unfortunately he did not bring these in. 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 He says that it has been present since mid December. He is currently residing in [facility] until March and [name] podiatrist has been looking after him there. They have been simply painting the area with Betadine.  He has not had any lower extremity arterial studies since 2019, at which time his right ABI was 0.69. Measured in clinic today, it was 0.71. He is not aware of his most recent hemoglobin A1c, but historically he has had exceptionally poor control. On the basis of his right first metatarsal, there is [name] small crescent shaped wound. There is surrounding eschar and callus. There is no malodor or purulent drainage. 09/08/2022: The original wound is smaller today and fairly clean, but there is some discoloration and [name] pulpy texture to the adjacent callus. Underneath this, the tissue is open exposing the fat layer. It looks like perhaps there was [name] crack in the callus and moisture got under the skin and caused breakdown. 09/15/2022: There has been more moisture related tissue breakdown. The callus is very soft and the underlying tissue is more open. 09/28/2022: The wound looks much better. He has done [name] good job keeping it dry. There is some callus overlying much of the wound surface. There is slough on the exposed open areas. 10/14/2022: There has been [name] lot of moisture related tissue breakdown. He is still forming callus over the top and then it seems that moisture gets underneath the callus and causes tissue damage. There is slough on the exposed wound surface. They will be moving back to the local area from the beach this weekend. 10/21/2022: His foot is less wet, but there is no significant change to his wound. He has developed [name] blister on his left anterior tibial surface. There is no open wound here, but he does have some fairly significant edema. We are planning to apply [name] total contact cast today. 10/23/2022: Here for his obligatory first cast change. He says he has not had any issues wearing the cast or walking in the boot. No detrimental effects on his  wound. 10/29/2022: The wound is looking better. Clearly, putting him in the cast has prevented water from getting in under his callus and causing further  tissue breakdown. The blister on his left anterior tibial surface has not yet ruptured. Edema control is significantly improved. 11/05/2022: The wound on his right plantar foot is getting better. There is still some callus accumulation around the wounds, but there are just 2 open areas now, [name] very small 1 on the medial aspect of his metatarsal head and [name] little bit larger 1 on the plantar surface. The blister on his left anterior tibial surface is now open with hanging dry skin. 11/12/2022: The left anterior tibial wound is closed. The wound on his right plantar foot is smaller with just [name] little bit of callus around the edges. Patient History Information obtained from Patient. Family History Unknown History. Social History Never smoker, Marital Status - Single, Alcohol Use - Never, Drug Use - No History, Caffeine Use - Rarely. Medical History Respiratory Patient has history of Sleep Apnea Cardiovascular Patient has history of Arrhythmia - [name]-Fib, Coronary Artery Disease - s/p CABG, Deep Vein Thrombosis, Hypertension, Myocardial Infarction, Peripheral Arterial Disease - s/p Fempop x 2 Endocrine Patient has history of Type II Diabetes Neurologic Patient has history of Neuropathy Hospitalization/Surgery History - colonoscopy. - polypectomy. - peripheral vascular cath. - shoulder arthroscopy. - carpal tunnel release. - coronary artery bypass. - appendectomy. - cardiac cath. - coronary angioplasty. - thrombectomy. - knee arthroplasty. - popliteal artery stent. 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 Bradycardic, asymptomatic. no acute distress. Vitals Time Taken: 8:14 AM, Height: 70 in, Weight: 260 lbs, BMI: 37.3, Temperature: 98.0 F, Pulse: 51 bpm,  Respiratory Rate: 18 breaths/min, Blood Pressure: 185/72 mmHg. Respiratory Normal work of breathing on room air. General Notes: 11/12/2022: The left anterior tibial wound is closed. The wound on his right plantar foot is smaller with just [name] little bit of callus around the edges. Integumentary (Hair, Skin) Wound #2 status is Open. Original cause of wound was Gradually Appeared. The date acquired was: 07/08/2022. The wound has been in treatment 10 weeks. The wound is located on the Right,Plantar Foot. The wound measures 0.9cm length x 1.4cm width x 0.1cm depth; 0.99cm^2 area and 0.099cm^3 volume. There is Fat Layer (Subcutaneous Tissue) exposed. There is no tunneling noted, however, there is undermining starting at 3:00 and ending at 7:00 with [name] maximum distance of 0.4cm. There is [name] medium amount of serosanguineous drainage noted. The wound margin is distinct with the outline attached to the wound base. There is medium (34-66%) red granulation within the wound bed. There is [name] medium (34-66%) amount of necrotic tissue within the wound bed including Adherent Slough. The periwound skin appearance had no abnormalities noted for moisture. The periwound skin appearance had no abnormalities noted for color. The periwound skin appearance exhibited: Callus. Periwound temperature was noted as No Abnormality. Wound #3 status is Healed - Epithelialized. Original cause of wound was Blister. The date acquired was: 11/05/2022. The wound has been in treatment 1 weeks. The wound is located on the Left,Medial Lower Leg. The wound measures 0cm length x 0cm width x 0cm depth; 0cm^2 area and 0cm^3 volume. There is no tunneling or undermining noted. There is [name] medium amount of serosanguineous drainage noted. There is no granulation within the wound bed. There is [name] large (  67-100%) amount of necrotic tissue within the wound bed including Eschar. The periwound skin appearance had no abnormalities noted for color. The  periwound skin appearance exhibited: Scarring, Dry/Scaly. Assessment Active Problems ICD-10 Non-pressure chronic ulcer of other part of right foot with fat layer exposed Peripheral vascular disease, unspecified Atherosclerotic heart disease of native coronary artery without angina pectoris Essential (primary) hypertension Type 2 diabetes mellitus with hyperglycemia Type 2 diabetes mellitus with diabetic neuropathy, unspecified Cerebral infarction, unspecified Procedures Wound #2 Pre-procedure diagnosis of Wound #2 is [name] Diabetic Wound/Ulcer of the Lower Extremity located on the Right,Plantar Foot . There was [name] T Contact Cast otal Procedure by Duanne Guess, MD. Post procedure Diagnosis Wound #2: Same as Pre-Procedure Notes: Scribed for Dr. Lady Gary by Tommie Ard, RN. Plan Follow-up Appointments: Return Appointment in 1 week. - Dr. Lady Gary - room 2 Anesthetic: (In clinic) Topical Lidocaine 4% applied to wound bed Bathing/ Shower/ Hygiene: May shower with protection but do not get wound dressing(s) wet. Protect dressing(s) with water repellant cover (for example, large plastic bag) or [name] cast cover and may then take shower. Edema Control - Lymphedema / SCD / Other: Elevate legs to the level of the heart or above for 30 minutes daily and/or when sitting for 3-4 times [name] day throughout the day. Avoid standing for long periods of time. Patient to wear own compression stockings every day. Moisturize legs daily. Off-Loading: T Contact Cast to Right Lower Extremity otal Removable cast walker boot to: - right leg Non Wound Condition: Other Non Wound Condition Orders/Instructions: - 3 layer wrap with silver alginate over blister WOUND #2: - Foot Wound Laterality: Plantar, Right Cleanser: Soap and Water 1 x Per Week/30 Days Discharge Instructions: May shower and wash wound with dial antibacterial soap and water prior to dressing change. [name], [name] ([id number])  [id number] Page 7 of 9 Cleanser: Wound Cleanser 1 x Per Week/30 Days Discharge Instructions: Cleanse the wound with wound cleanser prior to applying [name] clean dressing using gauze sponges, not tissue or cotton balls. Prim Dressing: Hydrofera Blue Ready Transfer Foam, 2.5x2.5 (in/in) 1 x Per Week/30 Days ary Discharge Instructions: Apply directly to wound bed as directed Secondary Dressing: Woven Gauze Sponge, Non-Sterile 4x4 in 1 x Per Week/30 Days Discharge Instructions: Apply over primary dressing as directed. Secondary Dressing: Zetuvit Plus 4x8 in 1 x Per Week/30 Days Discharge Instructions: Apply over primary dressing as directed. Secured With: [employer], 4.5x3.1 (in/yd) 1 x Per Week/30 Days Discharge Instructions: Secure with Kerlix as directed. 11/12/2022: The left anterior tibial wound is closed. The wound on his right plantar foot is smaller with just [name] little bit of callus around the edges. The wound bed was prepared for total contact casting. Hydrofera Blue Ready was used as the contact layer. The total contact cast was then applied complication or difficulty. He will follow-up in 1 week. Electronic Signature(s) Signed: 11/12/2022 4:51:53 PM By: Shawn Stall RN, BSN Signed: 11/13/2022 7:52:44 AM By: Duanne Guess MD FACS Previous Signature: 11/12/2022 8:44:32 AM Version By: Duanne Guess MD FACS Entered By: Shawn Stall on 11/12/2022 16:47:17 -------------------------------------------------------------------------------- HxROS Details Patient Name: Date of Service: [name], [name]. 11/12/2022 8:15 [name] Medical Record Number: [id number] Patient Account Number: [id number] Date of Birth/Sex: Treating RN: 03/03/1956 (67 y.o. M) Primary Care Provider: Arva Chafe Other Clinician: Referring Provider: Treating Provider/Extender: [name] in Treatment: 10 Information Obtained  From Patient Respiratory Medical History: Positive for: Sleep Apnea Cardiovascular Medical History: Positive for: Arrhythmia -  [name]-Fib; Coronary Artery Disease - s/p CABG; Deep Vein Thrombosis; Hypertension; Myocardial Infarction; Peripheral Arterial Disease - s/p Fempop x 2 Past Medical History Notes: CVA x 3 Endocrine Medical History: Positive for: Type II Diabetes Time with diabetes: since 1997 Treated with: Insulin, Oral agents Blood sugar tested every day: Yes Tested : 2-3x per day Musculoskeletal Medical History: Past Medical History Notes: carpal tunnel syndrome Neurologic Medical History: Positive for: Neuropathy Past Medical History Notes: stroke Oncologic Medical History: Past Medical History NotesIMANOL, FETCHKO [name] ([id number]) [id number] Page 8 of 9 skin cancer Immunizations Pneumococcal Vaccine: Received Pneumococcal Vaccination: Yes Received Pneumococcal Vaccination On or After 60th Birthday: Yes Implantable Devices None Hospitalization / Surgery History Type of Hospitalization/Surgery colonoscopy polypectomy peripheral vascular cath shoulder arthroscopy carpal tunnel release coronary artery bypass appendectomy cardiac cath coronary angioplasty thrombectomy knee arthroplasty popliteal artery stent Family and Social History Unknown History: Yes; Never smoker; Marital Status - Single; Alcohol Use: Never; Drug Use: No History; Caffeine Use: Rarely; Financial Concerns: No; Food, Clothing or Shelter Needs: No; Support System Lacking: No; Transportation Concerns: No Psychologist, prison and probation services) Signed: 11/12/2022 4:24:20 PM By: Duanne Guess MD FACS Entered By: Duanne Guess on 11/12/2022 08:42:02 -------------------------------------------------------------------------------- Total Contact Cast Details Patient Name: Date of Service: [name] [name]. 11/12/2022 8:15 [name] Medical Record Number: [id number] Patient Account Number:  [id number] Date of Birth/Sex: Treating RN: 20-Jul-1956 (67 y.o. Valma Cava Primary Care Provider: Arva Chafe Other Clinician: Referring Provider: Treating Provider/Extender: [name] in Treatment: 10 T Contact Cast Applied for Wound Assessment: otal Wound #2 Right,Plantar Foot Performed By: Physician Duanne Guess, MD Post Procedure Diagnosis Same as Pre-procedure Notes Scribed for Dr. Lady Gary by Tommie Ard, RN Electronic Signature(s) Signed: 11/12/2022 4:24:20 PM By: Duanne Guess MD FACS Signed: 11/19/2022 2:05:17 PM By: Tommie Ard RN Entered By: Tommie Ard on 11/12/2022 08:36:46 -------------------------------------------------------------------------------- SuperBill Details Patient Name: Date of Service: [name] [name]. 11/12/2022 Medical Record Number: [id number] Patient Account Number: [id number] Date of Birth/Sex: Treating RN: January 22, 1956 (67 y.o. M) Primary Care Provider: Arva Chafe Other Clinician: Referring Provider: Treating Provider/Extender: [name] in Treatment: 10 Diagnosis Coding Giambalvo, Shiven [name] ([id number]) [id number] Page 9 of 9 ICD-10 Codes Code Description [id number] Non-pressure chronic ulcer of other part of right foot with fat layer exposed I73.9 Peripheral vascular disease, unspecified I25.10 Atherosclerotic heart disease of native coronary artery without angina pectoris I10 Essential (primary) hypertension E11.65 Type 2 diabetes mellitus with hyperglycemia E11.40 Type 2 diabetes mellitus with diabetic neuropathy, unspecified I63.9 Cerebral infarction, unspecified Facility Procedures : CPT4 Code: 30160109 Description: 32355 - APPLY TOTAL CONTACT LEG CAST ICD-10 Diagnosis Description L97.512 Non-pressure chronic ulcer of other part of right foot with fat layer exposed Modifier: Quantity: 1 Physician Procedures : CPT4 Code Description  Modifier [id number] 99213 - WC PHYS LEVEL 3 - EST PT ICD-10 Diagnosis Description L97.512 Non-pressure chronic ulcer of other part of right foot with fat layer exposed E11.40 Type 2 diabetes mellitus with diabetic neuropathy,  unspecified I73.9 Peripheral vascular disease, unspecified E11.65 Type 2 diabetes mellitus with hyperglycemia Quantity: 1 : [id number] - WC PHYS APPLY TOTAL CONTACT CAST ICD-10 Diagnosis Description L97.512 Non-pressure chronic ulcer of other part of right foot with fat layer exposed Quantity: 1 Electronic Signature(s) Signed: 11/12/2022 8:45:08 AM By: Duanne Guess MD FACS Entered By: Duanne Guess on 11/12/2022 08:45:08

## 2022-11-14 DIAGNOSIS — G4733 Obstructive sleep apnea (adult) (pediatric): Secondary | ICD-10-CM | POA: Diagnosis not present

## 2022-11-19 ENCOUNTER — Encounter (HOSPITAL_BASED_OUTPATIENT_CLINIC_OR_DEPARTMENT_OTHER): Payer: Medicare HMO | Attending: General Surgery | Admitting: General Surgery

## 2022-11-19 DIAGNOSIS — E1142 Type 2 diabetes mellitus with diabetic polyneuropathy: Secondary | ICD-10-CM | POA: Insufficient documentation

## 2022-11-19 DIAGNOSIS — E785 Hyperlipidemia, unspecified: Secondary | ICD-10-CM | POA: Insufficient documentation

## 2022-11-19 DIAGNOSIS — Z7902 Long term (current) use of antithrombotics/antiplatelets: Secondary | ICD-10-CM | POA: Diagnosis not present

## 2022-11-19 DIAGNOSIS — L97512 Non-pressure chronic ulcer of other part of right foot with fat layer exposed: Secondary | ICD-10-CM | POA: Insufficient documentation

## 2022-11-19 DIAGNOSIS — E1151 Type 2 diabetes mellitus with diabetic peripheral angiopathy without gangrene: Secondary | ICD-10-CM | POA: Insufficient documentation

## 2022-11-19 DIAGNOSIS — I252 Old myocardial infarction: Secondary | ICD-10-CM | POA: Diagnosis not present

## 2022-11-19 DIAGNOSIS — I110 Hypertensive heart disease with heart failure: Secondary | ICD-10-CM | POA: Insufficient documentation

## 2022-11-19 DIAGNOSIS — G4733 Obstructive sleep apnea (adult) (pediatric): Secondary | ICD-10-CM | POA: Insufficient documentation

## 2022-11-19 DIAGNOSIS — E11621 Type 2 diabetes mellitus with foot ulcer: Secondary | ICD-10-CM | POA: Insufficient documentation

## 2022-11-19 DIAGNOSIS — I5032 Chronic diastolic (congestive) heart failure: Secondary | ICD-10-CM | POA: Insufficient documentation

## 2022-11-19 DIAGNOSIS — Z85828 Personal history of other malignant neoplasm of skin: Secondary | ICD-10-CM | POA: Insufficient documentation

## 2022-11-19 DIAGNOSIS — I2510 Atherosclerotic heart disease of native coronary artery without angina pectoris: Secondary | ICD-10-CM | POA: Insufficient documentation

## 2022-11-19 DIAGNOSIS — E1165 Type 2 diabetes mellitus with hyperglycemia: Secondary | ICD-10-CM | POA: Diagnosis not present

## 2022-11-19 DIAGNOSIS — Z7901 Long term (current) use of anticoagulants: Secondary | ICD-10-CM | POA: Insufficient documentation

## 2022-11-19 DIAGNOSIS — I4891 Unspecified atrial fibrillation: Secondary | ICD-10-CM | POA: Diagnosis not present

## 2022-11-19 DIAGNOSIS — Z8673 Personal history of transient ischemic attack (TIA), and cerebral infarction without residual deficits: Secondary | ICD-10-CM | POA: Insufficient documentation

## 2022-11-20 NOTE — Progress Notes (Signed)
Bamford, Terre A ([id number]) [id number] Page 1 of 7 Visit Report for 11/12/2022 Arrival Information Details Patient Name: Date of Service: WIESEMANN, [name] RK A. 11/12/2022 8:15 A M Medical Record Number: [id number] Patient Account Number: [id number] Date of Birth/Sex: Treating RN: 14-Jun-1956 (67 y.o. Valma Cava Primary Care Christinna Sprung: Arva Chafe Other Clinician: Referring Vernella Niznik: Treating Rolla Kedzierski/Extender: [name] in Treatment: 10 Visit Information History Since Last Visit Added or deleted any medications: No Patient Arrived: Wheel Chair Any new allergies or adverse reactions: No Arrival Time: 08:16 Had a fall or experienced change in No Accompanied By: wife activities of daily living that may affect Transfer Assistance: EasyPivot Patient Lift risk of falls: Patient Identification Verified: Yes Signs or symptoms of abuse/neglect since last visito No Secondary Verification Process Completed: Yes Hospitalized since last visit: No Patient Requires Transmission-Based Precautions: No Implantable device outside of the clinic excluding No Patient Has Alerts: No cellular tissue based products placed in the center since last visit: Has Dressing in Place as Prescribed: Yes Has Compression in Place as Prescribed: Yes Pain Present Now: No Electronic Signature(s) Signed: 11/19/2022 2:05:17 PM By: Tommie Ard RN Entered By: Tommie Ard on 11/12/2022 08:17:02 -------------------------------------------------------------------------------- Encounter Discharge Information Details Patient Name: Date of Service: [name] Pian, [name] 11/12/2022 8:15 A M Medical Record Number: [id number] Patient Account Number: [id number] Date of Birth/Sex: Treating RN: 03-Sep-1955 (67 y.o. Valma Cava Primary Care Blaize Epple: Arva Chafe Other Clinician: Referring Mckay Brandt: Treating Marciana Uplinger/Extender: [name]  in Treatment: 10 Encounter Discharge Information Items Discharge Condition: Stable Ambulatory Status: Ambulatory Discharge Destination: Home Transportation: Private Auto Accompanied By: wife Schedule Follow-up Appointment: Yes Clinical Summary of Care: Electronic Signature(s) Signed: 11/12/2022 10:08:26 AM By: Tommie Ard RN Entered By: Tommie Ard on 11/12/2022 10:08:26 -------------------------------------------------------------------------------- Lower Extremity Assessment Details Patient Name: Date of Service: [name], [name] 11/12/2022 8:15 A M Medical Record Number: [id number] Patient Account Number: [id number] Date of Birth/Sex: Treating RN: 09/13/1955 (67 y.o. Valma Cava Primary Care Dorothymae Maciver: Arva Chafe Other Clinician: Referring Madelaine Whipple: Treating Jameah Rouser/Extender: [name] in Treatment: 10 Edema Assessment P[Left: EGG, [place] A ([id number])] [name]: [id number] Page 2 of 7] Assessed: [Left: No] [Right: No] [Left: Edema] [Right: :] Calf Left: Right: Point of Measurement: From Medial Instep 39.5 cm 38.8 cm Ankle Left: Right: Point of Measurement: From Medial Instep 28 cm 27.2 cm Vascular Assessment Pulses: Dorsalis Pedis Palpable: [Left:Yes] [Right:Yes] Electronic Signature(s) Signed: 11/19/2022 2:05:17 PM By: Tommie Ard RN Entered By: Tommie Ard on 11/12/2022 08:23:33 -------------------------------------------------------------------------------- Multi Wound Chart Details Patient Name: Date of Service: [name] Pian, [name] 11/12/2022 8:15 A M Medical Record Number: [id number] Patient Account Number: [id number] Date of Birth/Sex: Treating RN: 11/27/1955 (67 y.o. M) Primary Care Sarra Rachels: Arva Chafe Other Clinician: Referring Brendin Situ: Treating Navie Lamoreaux/Extender: [name] in Treatment: 10 Vital Signs Height(in): 70 Pulse(bpm): 51 Weight(lbs):  260 Blood Pressure(mmHg): 185/72 Body Mass Index(BMI): 37.3 Temperature(F): 98.0 Respiratory Rate(breaths/min): 18 [2:Photos:] [N/A:N/A] Right, Plantar Foot Left, Medial Lower Leg N/A Wound Location: Gradually Appeared Blister N/A Wounding Event: Diabetic Wound/Ulcer of the Lower Diabetic Wound/Ulcer of the Lower N/A Primary Etiology: Extremity Extremity Sleep Apnea, Arrhythmia, Coronary Sleep Apnea, Arrhythmia, Coronary N/A Comorbid History: Artery Disease, Deep Vein Artery Disease, Deep Vein Thrombosis, Hypertension, Myocardial Thrombosis, Hypertension, Myocardial Infarction, Peripheral Arterial Disease, Infarction, Peripheral Arterial Disease, Type II Diabetes, Neuropathy Type II Diabetes, Neuropathy 07/08/2022 11/05/2022 N/A Date Acquired: 10 1 N/A Weeks of Treatment: Open Open N/A Wound Status:  No No N/A Wound Recurrence: 0.9x1.4x0.1 0.1x0.1x0.1 N/A Measurements L x W x D (cm) 0.99 0.008 N/A A (cm) : rea 0.099 0.001 N/A Volume (cm) : 61.20% 99.90% N/A % Reduction in A rea: 61.20% 99.80% N/A % Reduction in Volume: 3 Starting Position 1 (o'clock): 7 Ending Position 1 (o'clock): 0.4 Maximum Distance 1 (cm): Yes No N/A Undermining: Grade 1 Grade 2 N/A Classification: Medium Medium N/A Exudate A mount: [name] A ([id number]) [id number] Page 3 of 7 Serosanguineous Serosanguineous N/A Exudate Type: red, brown red, brown N/A Exudate Color: Distinct, outline attached N/A N/A Wound Margin: Medium (34-66%) None Present (0%) N/A Granulation Amount: Red N/A N/A Granulation Quality: Medium (34-66%) Large (67-100%) N/A Necrotic Amount: Adherent Slough Eschar, Adherent Slough N/A Necrotic Tissue: Fat Layer (Subcutaneous Tissue): Yes Fat Layer (Subcutaneous Tissue): Yes N/A Exposed Structures: Fascia: No Fascia: No Tendon: No Tendon: No Muscle: No Muscle: No Joint: No Joint: No Bone: No Bone: No Medium (34-66%) None  N/A Epithelialization: Callus: Yes Scarring: Yes N/A Periwound Skin Texture: No Abnormalities Noted Dry/Scaly: Yes N/A Periwound Skin Moisture: No Abnormalities Noted No Abnormalities Noted N/A Periwound Skin Color: No Abnormality N/A N/A Temperature: T Contact Cast otal N/A N/A Procedures Performed: Treatment Notes Electronic Signature(s) Signed: 11/12/2022 8:36:19 AM By: Duanne Guess MD FACS Entered By: Duanne Guess on 11/12/2022 08:36:19 -------------------------------------------------------------------------------- Multi-Disciplinary Care Plan Details Patient Name: Date of Service: [name] Pian, [name] 11/12/2022 8:15 A M Medical Record Number: [id number] Patient Account Number: [id number] Date of Birth/Sex: Treating RN: Dec 26, 1955 (67 y.o. Valma Cava Primary Care Caoilainn Sacks: Arva Chafe Other Clinician: Referring Catheryne Deford: Treating Aniken Monestime/Extender: [name] in Treatment: 10 Active Inactive Abuse / Safety / Falls / Self Care Management Nursing Diagnoses: Impaired physical mobility Potential for falls Goals: Patient will not experience any injury related to falls Date Initiated: 08/31/2022 Target Resolution Date: 12/18/2022 Goal Status: Active Patient/caregiver will verbalize/demonstrate measures taken to improve the patient's personal safety Date Initiated: 08/31/2022 Target Resolution Date: 01/15/2023 Goal Status: Active Interventions: Provide education on basic hygiene Provide education on fall prevention Notes: Wound/Skin Impairment Nursing Diagnoses: Impaired tissue integrity Knowledge deficit related to ulceration/compromised skin integrity Goals: Patient/caregiver will verbalize understanding of skin care regimen Date Initiated: 08/31/2022 Target Resolution Date: 12/19/2022 Goal Status: Active Interventions: Assess patient/caregiver ability to obtain necessary supplies Assess patient/caregiver ability to  perform ulcer/skin care regimen upon admission and as needed Assess ulceration(s) every visit Rabanal, Salman A ([id number]) [phone].pdf Page 4 of 7 Treatment Activities: Skin care regimen initiated : 08/31/2022 Topical wound management initiated : 08/31/2022 Notes: Electronic Signature(s) Signed: 11/12/2022 10:07:27 AM By: Tommie Ard RN Entered By: Tommie Ard on 11/12/2022 10:07:27 -------------------------------------------------------------------------------- Pain Assessment Details Patient Name: Date of Service: [name] Pian, [name] 11/12/2022 8:15 A M Medical Record Number: [id number] Patient Account Number: [id number] Date of Birth/Sex: Treating RN: May 26, 1956 (67 y.o. Valma Cava Primary Care Jackson Coffield: Arva Chafe Other Clinician: Referring Jamai Dolce: Treating Jantzen Pilger/Extender: [name] in Treatment: 10 Active Problems Location of Pain Severity and Description of Pain Patient Has Paino No Site Locations Rate the pain. Current Pain Level: 0 Pain Management and Medication Current Pain Management: Electronic Signature(s) Signed: 11/19/2022 2:05:17 PM By: Tommie Ard RN Entered By: Tommie Ard on 11/12/2022 08:14:40 -------------------------------------------------------------------------------- Patient/Caregiver Education Details Patient Name: Date of Service: Karie Georges 4/25/2024andnbsp8:15 A M Medical Record Number: [id number] Patient Account Number: [id number] Date of Birth/Gender: Treating RN: 04-24-1956 (67 y.o. Valma Cava Primary Care Physician: Arva Chafe Other  Clinician: Referring Physician: Treating Physician/Extender: [name] in Treatment: 10 Education Assessment Education Provided To: Patient Education Topics Provided CHAYSON, GRALL A ([id number]) [id number] Page 5 of 7 Wound Debridement: Methods:  Explain/Verbal Responses: Reinforcements needed, State content correctly Wound/Skin Impairment: Methods: Explain/Verbal Responses: Reinforcements needed, State content correctly Electronic Signature(s) Signed: 11/19/2022 2:05:17 PM By: Tommie Ard RN Entered By: Tommie Ard on 11/12/2022 10:07:43 -------------------------------------------------------------------------------- Wound Assessment Details Patient Name: Date of Service: [name] Pian, [name] 11/12/2022 8:15 A M Medical Record Number: [id number] Patient Account Number: [id number] Date of Birth/Sex: Treating RN: September 27, 1955 (67 y.o. Valma Cava Primary Care Neo Yepiz: Arva Chafe Other Clinician: Referring Caroly Purewal: Treating Mattilyn Crites/Extender: [name] in Treatment: 10 Wound Status Wound Number: 2 Primary Diabetic Wound/Ulcer of the Lower Extremity Etiology: Wound Location: Right, Plantar Foot Wound Open Wounding Event: Gradually Appeared Status: Date Acquired: 07/08/2022 Comorbid Sleep Apnea, Arrhythmia, Coronary Artery Disease, Deep Vein Weeks Of Treatment: 10 History: Thrombosis, Hypertension, Myocardial Infarction, Peripheral Arterial Clustered Wound: No Disease, Type II Diabetes, Neuropathy Photos Wound Measurements Length: (cm) 0.9 Width: (cm) 1.4 Depth: (cm) 0.1 Area: (cm) 0.99 Volume: (cm) 0.099 % Reduction in Area: 61.2% % Reduction in Volume: 61.2% Epithelialization: Medium (34-66%) Tunneling: No Undermining: Yes Starting Position (o'clock): 3 Ending Position (o'clock): 7 Maximum Distance: (cm) 0.4 Wound Description Classification: Grade 1 Wound Margin: Distinct, outline attached Exudate Amount: Medium Exudate Type: Serosanguineous Exudate Color: red, brown Foul Odor After Cleansing: No Slough/Fibrino Yes Wound Bed Granulation Amount: Medium (34-66%) Exposed Structure Granulation Quality: Red Fascia Exposed: No Necrotic Amount: Medium (34-66%) Fat  Layer (Subcutaneous Tissue) Exposed: Yes Necrotic Quality: Adherent Slough Tendon Exposed: No Muscle Exposed: No Joint Exposed: No Bone Exposed: No Dukes, Gussie A ([id number]) [id number] Page 6 of 7 Periwound Skin Texture Texture Color No Abnormalities Noted: No No Abnormalities Noted: Yes Callus: Yes Temperature / Pain Temperature: No Abnormality Moisture No Abnormalities Noted: Yes Electronic Signature(s) Signed: 11/19/2022 2:05:17 PM By: Tommie Ard RN Entered By: Tommie Ard on 11/12/2022 08:26:49 -------------------------------------------------------------------------------- Wound Assessment Details Patient Name: Date of Service: [name] Pian, [name] 11/12/2022 8:15 A M Medical Record Number: [id number] Patient Account Number: [id number] Date of Birth/Sex: Treating RN: 1955/09/14 (67 y.o. Valma Cava Primary Care Mirranda Monrroy: Arva Chafe Other Clinician: Referring Salvatrice Morandi: Treating Abi Shoults/Extender: [name] in Treatment: 10 Wound Status Wound Number: 3 Primary Diabetic Wound/Ulcer of the Lower Extremity Etiology: Wound Location: Left, Medial Lower Leg Wound Healed - Epithelialized Wounding Event: Blister Status: Date Acquired: 11/05/2022 Comorbid Sleep Apnea, Arrhythmia, Coronary Artery Disease, Deep Vein Weeks Of Treatment: 1 History: Thrombosis, Hypertension, Myocardial Infarction, Peripheral Arterial Clustered Wound: No Disease, Type II Diabetes, Neuropathy Wound Measurements Length: (cm) Width: (cm) Depth: (cm) Area: (cm) Volume: (cm) 0 % Reduction in Area: 100% 0 % Reduction in Volume: 100% 0 Epithelialization: None 0 Tunneling: No 0 Undermining: No Wound Description Classification: Grade 2 Exudate Amount: Medium Exudate Type: Serosanguineous Exudate Color: red, brown Foul Odor After Cleansing: No Slough/Fibrino Yes Wound Bed Granulation Amount: None Present (0%) Exposed  Structure Necrotic Amount: Large (67-100%) Fascia Exposed: No Necrotic Quality: Eschar Fat Layer (Subcutaneous Tissue) Exposed: No Tendon Exposed: No Muscle Exposed: No Joint Exposed: No Bone Exposed: No Periwound Skin Texture Texture Color No Abnormalities Noted: No No Abnormalities Noted: Yes Scarring: Yes Moisture No Abnormalities Noted: No Dry / Scaly: Yes Treatment Notes Wound #3 (Lower Leg) Wound Laterality: Left, Medial Cleanser Peri-Wound Care Topical Hammen, Kirubel A ([id number]) [id number] Page 7 of 7  Primary Dressing Secondary Dressing Secured With Compression Wrap Compression Stockings Add-Ons Electronic Signature(s) Signed: 11/19/2022 2:05:17 PM By: Tommie Ard RN Entered By: Tommie Ard on 11/12/2022 08:37:14 -------------------------------------------------------------------------------- Vitals Details Patient Name: Date of Service: [name] Pian, [name] 11/12/2022 8:15 A M Medical Record Number: [id number] Patient Account Number: [id number] Date of Birth/Sex: Treating RN: Jan 06, 1956 (67 y.o. Valma Cava Primary Care Rivers Gassmann: Arva Chafe Other Clinician: Referring Kylyn Mcdade: Treating Penne Rosenstock/Extender: [name] in Treatment: 10 Vital Signs Time Taken: 08:14 Temperature (F): 98.0 Height (in): 70 Pulse (bpm): 51 Weight (lbs): 260 Respiratory Rate (breaths/min): 18 Body Mass Index (BMI): 37.3 Blood Pressure (mmHg): 185/72 Reference Range: 80 - 120 mg / dl Electronic Signature(s) Signed: 11/19/2022 2:05:17 PM By: Tommie Ard RN Entered By: Tommie Ard on 11/12/2022 08:16:31

## 2022-11-23 ENCOUNTER — Other Ambulatory Visit: Payer: Self-pay | Admitting: Internal Medicine

## 2022-11-23 ENCOUNTER — Other Ambulatory Visit: Payer: Self-pay | Admitting: Gastroenterology

## 2022-11-26 ENCOUNTER — Encounter (HOSPITAL_BASED_OUTPATIENT_CLINIC_OR_DEPARTMENT_OTHER): Payer: Medicare HMO | Admitting: General Surgery

## 2022-11-26 DIAGNOSIS — L97512 Non-pressure chronic ulcer of other part of right foot with fat layer exposed: Secondary | ICD-10-CM | POA: Diagnosis not present

## 2022-11-26 DIAGNOSIS — Z7901 Long term (current) use of anticoagulants: Secondary | ICD-10-CM | POA: Diagnosis not present

## 2022-11-26 DIAGNOSIS — E1151 Type 2 diabetes mellitus with diabetic peripheral angiopathy without gangrene: Secondary | ICD-10-CM | POA: Diagnosis not present

## 2022-11-26 DIAGNOSIS — E1165 Type 2 diabetes mellitus with hyperglycemia: Secondary | ICD-10-CM | POA: Diagnosis not present

## 2022-11-26 DIAGNOSIS — Z7902 Long term (current) use of antithrombotics/antiplatelets: Secondary | ICD-10-CM | POA: Diagnosis not present

## 2022-11-26 DIAGNOSIS — E1142 Type 2 diabetes mellitus with diabetic polyneuropathy: Secondary | ICD-10-CM | POA: Diagnosis not present

## 2022-11-26 DIAGNOSIS — I4891 Unspecified atrial fibrillation: Secondary | ICD-10-CM | POA: Diagnosis not present

## 2022-11-26 DIAGNOSIS — Z85828 Personal history of other malignant neoplasm of skin: Secondary | ICD-10-CM | POA: Diagnosis not present

## 2022-11-26 DIAGNOSIS — Z8673 Personal history of transient ischemic attack (TIA), and cerebral infarction without residual deficits: Secondary | ICD-10-CM | POA: Diagnosis not present

## 2022-11-26 DIAGNOSIS — I2510 Atherosclerotic heart disease of native coronary artery without angina pectoris: Secondary | ICD-10-CM | POA: Diagnosis not present

## 2022-11-26 DIAGNOSIS — I252 Old myocardial infarction: Secondary | ICD-10-CM | POA: Diagnosis not present

## 2022-11-26 DIAGNOSIS — G4733 Obstructive sleep apnea (adult) (pediatric): Secondary | ICD-10-CM | POA: Diagnosis not present

## 2022-11-26 DIAGNOSIS — E11621 Type 2 diabetes mellitus with foot ulcer: Secondary | ICD-10-CM | POA: Diagnosis not present

## 2022-11-26 DIAGNOSIS — E785 Hyperlipidemia, unspecified: Secondary | ICD-10-CM | POA: Diagnosis not present

## 2022-11-26 DIAGNOSIS — I5032 Chronic diastolic (congestive) heart failure: Secondary | ICD-10-CM | POA: Diagnosis not present

## 2022-11-26 DIAGNOSIS — I110 Hypertensive heart disease with heart failure: Secondary | ICD-10-CM | POA: Diagnosis not present

## 2022-11-26 NOTE — Progress Notes (Signed)
MORELAND, DEGENOVA [name] ([id number]) [id number] Page 1 of 9 Visit Report for 11/26/2022 Chief Complaint Document Details Patient Name: Date of Service: [name], [name] [place] [name]. 11/26/2022 2:45 PM Medical Record Number: [id number] Patient Account Number: [id number] Date of Birth/Sex: Treating RN: 1955/08/10 (67 y.o. M) Primary Care Provider: Arva Chafe Other Clinician: Referring Provider: Treating Provider/Extender: [name] in Treatment: 12 Information Obtained from: Patient Chief Complaint 05/14/2020; patient is here for review of an abrasion injury on the left lateral calf 08/31/2022: [name] (1st metatarsal base, plantar) Electronic Signature(s) Signed: 11/26/2022 4:07:32 PM By: Duanne Guess MD FACS Entered By: Duanne Guess on 11/26/2022 16:07:32 -------------------------------------------------------------------------------- HPI Details Patient Name: Date of Service: [name], [name]. 11/26/2022 2:45 PM Medical Record Number: [id number] Patient Account Number: [id number] Date of Birth/Sex: Treating RN: [birth year] (67 y.o. M) Primary Care Provider: Arva Chafe Other Clinician: Referring Provider: Treating Provider/Extender: [name] in Treatment: 12 History of Present Illness HPI Description: ADMISSION 05/14/2020; this is [name] with multiple medical issues. Predominantly he has type 2 diabetes with [name] and also history of fairly significant PAD. He had [name] in February 2017 he also had an atherectomy and angioplasty by Dr. [name] of the right popliteal artery in 2016. He is supposed to be getting arterial studies annually however this was interrupted last year because of the pandemic. He tells [place] he was at [facility] 2 weeks ago was getting out of of the scooter and traumatized his left lateral  lower leg. There was [name] as the patient is on Plavix and Eliquis. They have been dressing this with Neosporin and doing [name] fairly good job. Wound measures 2.5 x 3.5 it does not have any depth he does not have [name] in his legs outside of surgery however he does have chronic edema and skin changes suggestive of chronic venous disease possibly some degree of lymphedema as well. Past medical history includes type 2 diabetes with peripheral neuropathy and gait instability, lumbar spondylosis, obstructive sleep apnea, history of [name], basal cell skin cancer, atrial fibrillation on anticoagulation, significant PAD as noted with [name] and [name] in 30th 2016. He also has [name] of coronary artery disease with an MI in 2002 hypertension hyperlipidemia and heart failure with preserved ejection fraction His last arterial studies I can see in epic were on 03/10/2018 this showed [name] and [name] with monophasic waveforms on the right. On the left his ABI was 1.20 with [name] and triphasic waveforms. He has not had arterial studies since. Our nurse in the clinic got an ABI on the left of 1.1 11/2; left anterior leg wound in the setting of chronic venous insufficiency. Wound was initially trauma. We have been using Hydrofera Blue under compression he has home health.. The wound looks [name] lot better today with improvement in surface area 11/16; left anterior leg wound in the setting of chronic venous insufficiency. Wound was initially trauma. We have been using Hydrofera Blue under compression. The patient is closed today. He is supposed to follow-up with vein and vascular with regards to arterial insufficiency nevertheless his leg wounds are 12/3; apparently 2 weeks ago when they were putting on their stockings they managed to get  3 wounds on the left anterior lower leg from abrasion when putting on the stockings. Home health came by the week of Thanksgiving and put Hydrofera Blue 4-layer wrap on this and there is only one superficial area remaining. The patient and his wife complained about the difficulties getting stockings on I think we are using 20/30. We will order bilateral external compression stockings which should be easier. 12/10; wound on the left anterior lower leg is closed. He has chronic venous insufficiency we ordered him Farrow wrap stockings unfortunately he did not bring these in. 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 He says that it has been present since mid December. He is currently residing in [facility] until March and [name] podiatrist has been looking after him there. They have been simply painting the area with Betadine. He has not had any lower extremity arterial studies since 2019, at which time his right ABI was 0.69. Measured in clinic today, it was 0.71. He is not aware of his most recent hemoglobin A1c, but historically he has had exceptionally poor control. On the basis of his right first metatarsal, there is [name] small crescent shaped wound. There is surrounding eschar and callus. There is no malodor or purulent [name], [name] ([id number]) [id number] Page 2 of 9 drainage. 09/08/2022: The original wound is smaller today and fairly clean, but there is some discoloration and [name] pulpy texture to the adjacent callus. Underneath this, the tissue is open exposing the fat layer. It looks like perhaps there was [name] crack in the callus and moisture got under the skin and caused breakdown. 09/15/2022: There has been more moisture related tissue breakdown. The callus is very soft and the underlying tissue is more open. 09/28/2022: The wound looks much better. He has done [name] good job keeping it dry. There is some callus  overlying much of the wound surface. There is slough on the exposed open areas. 10/14/2022: There has been [name] lot of moisture related tissue breakdown. He is still forming callus over the top and then it seems that moisture gets underneath the callus and causes tissue damage. There is slough on the exposed wound surface. They will be moving back to the local area from the beach this weekend. 10/21/2022: His foot is less wet, but there is no significant change to his wound. He has developed [name] blister on his left anterior tibial surface. There is no open wound here, but he does have some fairly significant edema. We are planning to apply [name] total contact cast today. 10/23/2022: Here for his obligatory first cast change. He says he has not had any issues wearing the cast or walking in the boot. No detrimental effects on his wound. 10/29/2022: The wound is looking better. Clearly, putting him in the cast has prevented water from getting in under his callus and causing further tissue breakdown. The blister on his left anterior tibial surface has not yet ruptured. Edema control is significantly improved. 11/05/2022: The wound on his right plantar foot is getting better. There is still some callus accumulation around the wounds, but there are just 2 open areas now, [name] very small 1 on the medial aspect of his metatarsal head and [name] little bit larger 1 on the plantar surface. The blister on his left anterior tibial surface is now open with hanging dry skin. 11/12/2022: The left anterior tibial wound is closed. The wound on his right plantar foot is  smaller with just [name] little bit of callus around the edges. 11/19/2022: The right plantar foot wound continues to contract. The surface is quite clean. The tiny satellite area on the medial aspect of his foot has closed. 11/26/2022: The wound is smaller again today. He is responding well to the offloading effects of total contact casting. Electronic Signature(s) Signed: 11/26/2022  4:08:04 PM By: Duanne Guess MD FACS Entered By: Duanne Guess on 11/26/2022 16:08:04 -------------------------------------------------------------------------------- Physical Exam Details Patient Name: Date of Service: [name], [name]. 11/26/2022 2:45 PM Medical Record Number: [id number] Patient Account Number: [id number] Date of Birth/Sex: Treating RN: 1956/01/04 (67 y.o. M) Primary Care Provider: Arva Chafe Other Clinician: Referring Provider: Treating Provider/Extender: [name] in Treatment: 12 Constitutional Hypertensive, asymptomatic. . . . no acute distress. Respiratory Normal work of breathing on room air. Notes 11/26/2022: The wound is smaller again today. He is responding well to the offloading effects of total contact casting. Electronic Signature(s) Signed: 11/26/2022 4:08:35 PM By: Duanne Guess MD FACS Entered By: Duanne Guess on 11/26/2022 16:08:35 -------------------------------------------------------------------------------- Physician Orders Details Patient Name: Date of Service: [name], [name]. 11/26/2022 2:45 PM Medical Record Number: [id number] Patient Account Number: [id number] Date of Birth/Sex: Treating RN: 12-Sep-1955 (67 y.o. M) Primary Care Provider: Arva Chafe Other Clinician: Referring Provider: Treating Provider/Extender: [name] in Treatment: 12 Verbal / Phone Orders: No Diagnosis Coding ICD-10 Coding Code Description [name], [name] ([id number]) [id number] Page 3 of 9 L97.512 Non-pressure chronic ulcer of other part of right foot with fat layer exposed I73.9 Peripheral vascular disease, unspecified I25.10 Atherosclerotic heart disease of native coronary artery without angina pectoris I10 Essential (primary) hypertension E11.65 Type 2 diabetes mellitus with hyperglycemia E11.40 Type 2 diabetes mellitus with diabetic neuropathy,  unspecified I63.9 Cerebral infarction, unspecified Follow-up Appointments ppointment in 1 week. - Dr. Lady Gary - room 2 Return [name] Anesthetic (In clinic) Topical Lidocaine 4% applied to wound bed Bathing/ Shower/ Hygiene May shower with protection but do not get wound dressing(s) wet. Protect dressing(s) with water repellant cover (for example, large plastic bag) or [name] cast cover and may then take shower. Edema Control - Lymphedema / SCD / Other Elevate legs to the level of the heart or above for 30 minutes daily and/or when sitting for 3-4 times [name] day throughout the day. Avoid standing for long periods of time. Patient to wear own compression stockings every day. Moisturize legs daily. Off-Loading Total Contact Cast to Right Lower Extremity Removable cast walker boot to: - right leg Non Wound Condition Left Lower Extremity Other Non Wound Condition Orders/Instructions: - 3 layer wrap with silver alginate over blister Wound Treatment Wound #2 - Foot Wound Laterality: Plantar, Right Cleanser: Soap and Water 1 x Per Week/30 Days Discharge Instructions: May shower and wash wound with dial antibacterial soap and water prior to dressing change. Cleanser: Wound Cleanser 1 x Per Week/30 Days Discharge Instructions: Cleanse the wound with wound cleanser prior to applying [name] clean dressing using gauze sponges, not tissue or cotton balls. Peri-Wound Care: Zinc Oxide Ointment 30g tube 1 x Per Week/30 Days Discharge Instructions: Apply Zinc Oxide to periwound with each dressing change Prim Dressing: Hydrofera Blue Ready Transfer Foam, 2.5x2.5 (in/in) 1 x Per Week/30 Days ary Discharge Instructions: Apply directly to wound bed as directed Secondary Dressing: Woven Gauze Sponge, Non-Sterile 4x4 in 1 x Per Week/30 Days Discharge Instructions: Apply over primary dressing as directed. Secondary Dressing: Zetuvit Plus 4x8 in 1 x Per Week/30 Days  Discharge Instructions: Apply over primary dressing as  directed. Secured With: [employer], 4.5x3.1 (in/yd) 1 x Per Week/30 Days Discharge Instructions: Secure with Kerlix as directed. Electronic Signature(s) Signed: 11/26/2022 4:15:25 PM By: Duanne Guess MD FACS Previous Signature: 11/26/2022 3:42:36 PM Version By: [place] Scrape Entered By: Duanne Guess on 11/26/2022 16:08:56 -------------------------------------------------------------------------------- Problem List Details Patient Name: Date of Service: [name], [name]. 11/26/2022 2:45 PM Medical Record Number: [id number] Patient Account Number: [id number] Date of Birth/Sex: Treating RN: 1955/09/27 (67 y.o. M) Primary Care Provider: Arva Chafe Other Clinician: Referring Provider: Treating Provider/Extender: [name] in Treatment: 12 Active Problems [name], [name] ([id number]) [id number] Page 4 of 9 ICD-10 Encounter Code Description Active Date MDM Diagnosis L97.512 Non-pressure chronic ulcer of other part of right foot with fat layer exposed 08/31/2022 No Yes I73.9 Peripheral vascular disease, unspecified 08/31/2022 No Yes I25.10 Atherosclerotic heart disease of native coronary artery without angina pectoris 08/31/2022 No Yes I10 Essential (primary) hypertension 08/31/2022 No Yes E11.65 Type 2 diabetes mellitus with hyperglycemia 08/31/2022 No Yes E11.40 Type 2 diabetes mellitus with diabetic neuropathy, unspecified 08/31/2022 No Yes I63.9 Cerebral infarction, unspecified 08/31/2022 No Yes Inactive Problems Resolved Problems ICD-10 Code Description Active Date Resolved Date L97.821 Non-pressure chronic ulcer of other part of left lower leg limited to breakdown of skin 11/05/2022 11/05/2022 Electronic Signature(s) Signed: 11/26/2022 4:06:29 PM By: Duanne Guess MD FACS Entered By: Duanne Guess on 11/26/2022 16:06:29 -------------------------------------------------------------------------------- Progress Note  Details Patient Name: Date of Service: [name], [name]. 11/26/2022 2:45 PM Medical Record Number: [id number] Patient Account Number: [id number] Date of Birth/Sex: Treating RN: 12/15/1955 (67 y.o. M) Primary Care Provider: Arva Chafe Other Clinician: Referring Provider: Treating Provider/Extender: [name] in Treatment: 12 Subjective Chief Complaint Information obtained from Patient 05/14/2020; patient is here for review of an abrasion injury on the left lateral calf 08/31/2022: [name] (1st metatarsal base, plantar) History of Present Illness (HPI) ADMISSION 05/14/2020; this is [name] with multiple medical issues. Predominantly he has type 2 diabetes with [name] and also history of fairly significant PAD. He had [name] in February 2017 he also had an atherectomy and angioplasty by Dr. [name] of the right popliteal artery in 2016. He is supposed to be getting arterial studies annually however this was interrupted last year because of the pandemic. He tells [place] he was at [facility] 2 weeks ago was getting out of of the scooter and traumatized his left lateral lower leg. There was [name] as the patient is on Plavix and Eliquis. They have been dressing this with Neosporin and doing [name] fairly good job. Wound measures 2.5 x 3.5 it does not have any depth he does not have [name] in his legs outside of surgery however he does have chronic edema and skin changes suggestive of chronic venous disease possibly some degree of lymphedema as well. DENVER, KIBBEY [name] ([id number]) [id number] Page 5 of 9 Past medical history includes type 2 diabetes with peripheral neuropathy and gait instability, lumbar spondylosis, obstructive sleep apnea, history of [name], basal cell skin cancer, atrial fibrillation on anticoagulation,  significant PAD as noted with [name] and [name] in 30th 2016. He also has [name] of coronary artery disease with an MI in 2002 hypertension hyperlipidemia and  heart failure with preserved ejection fraction His last arterial studies I can see in epic were on 03/10/2018 this showed [name] and [name] with monophasic waveforms on the right. On the left his ABI was 1.20 with [name] and triphasic waveforms. He has not had arterial studies since. Our nurse in the clinic got an ABI on the left of 1.1 11/2; left anterior leg wound in the setting of chronic venous insufficiency. Wound was initially trauma. We have been using Hydrofera Blue under compression he has home health.. The wound looks [name] lot better today with improvement in surface area 11/16; left anterior leg wound in the setting of chronic venous insufficiency. Wound was initially trauma. We have been using Hydrofera Blue under compression. The patient is closed today. He is supposed to follow-up with vein and vascular with regards to arterial insufficiency nevertheless his leg wounds are 12/3; apparently 2 weeks ago when they were putting on their stockings they managed to get 3 wounds on the left anterior lower leg from abrasion when putting on the stockings. Home health came by the week of Thanksgiving and put Hydrofera Blue 4-layer wrap on this and there is only one superficial area remaining. The patient and his wife complained about the difficulties getting stockings on I think we are using 20/30. We will order bilateral external compression stockings which should be easier. 12/10; wound on the left anterior lower leg is closed. He has chronic venous insufficiency we ordered him Farrow wrap stockings unfortunately he did not bring these in. 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 He says that it has been present since mid December. He is currently residing in [facility] until March and [name] podiatrist has been looking after him there. They have been simply painting the area with Betadine. He has not had any lower extremity arterial studies since 2019, at which time his right ABI was 0.69. Measured in clinic today, it was 0.71. He is not aware of his most recent hemoglobin A1c, but historically he has had exceptionally poor control. On the basis of his right first metatarsal, there is [name] small crescent shaped wound. There is surrounding eschar and callus. There is no malodor or purulent drainage. 09/08/2022: The original wound is smaller today and fairly clean, but there is some discoloration and [name] pulpy texture to the adjacent callus. Underneath this, the tissue is open exposing the fat layer. It looks like perhaps there was [name] crack in the callus and moisture got under the skin and caused breakdown. 09/15/2022: There has been more moisture related tissue breakdown. The callus is very soft and the underlying tissue is more open. 09/28/2022: The wound looks much better. He has done [name] good job keeping it dry. There is some callus overlying much of the wound surface. There is slough on the exposed open areas. 10/14/2022: There has been [name] lot of moisture related tissue breakdown. He is still forming callus over the top and then it seems that moisture gets underneath the callus and causes tissue damage. There is slough on the exposed wound surface. They will be moving back to the [place] area [place] the [place] this weekend. 10/21/2022: His foot is less wet, but there is no significant change to his wound. He has developed [name] blister on his left anterior tibial surface. There is no open wound here, but he does have some  fairly significant edema. We are planning to apply [name] total contact cast today. 10/23/2022: Here for his obligatory first cast  change. He says he has not had any issues wearing the cast or walking in the boot. No detrimental effects on his wound. 10/29/2022: The wound is looking better. Clearly, putting him in the cast has prevented water from getting in under his callus and causing further tissue breakdown. The blister on his left anterior tibial surface has not yet ruptured. Edema control is significantly improved. 11/05/2022: The wound on his right plantar foot is getting better. There is still some callus accumulation around the wounds, but there are just 2 open areas now, [name] very small 1 on the medial aspect of his metatarsal head and [name] little bit larger 1 on the plantar surface. The blister on his left anterior tibial surface is now open with hanging dry skin. 11/12/2022: The left anterior tibial wound is closed. The wound on his right plantar foot is smaller with just [name] little bit of callus around the edges. 11/19/2022: The right plantar foot wound continues to contract. The surface is quite clean. The tiny satellite area on the medial aspect of his foot has closed. 11/26/2022: The wound is smaller again today. He is responding well to the offloading effects of total contact casting. Patient History Information obtained from Patient. Family History Unknown History. Social History Never smoker, Marital Status - Single, Alcohol Use - Never, Drug Use - No History, Caffeine Use - Rarely. Medical History Respiratory Patient has history of Sleep Apnea Cardiovascular Patient has history of Arrhythmia - [name]-Fib, Coronary Artery Disease - s/p CABG, Deep Vein Thrombosis, Hypertension, Myocardial Infarction, Peripheral Arterial Disease - s/p Fempop x 2 Endocrine Patient has history of Type II Diabetes Neurologic Patient has history of Neuropathy Hospitalization/Surgery History - colonoscopy. - polypectomy. - peripheral vascular cath. - shoulder arthroscopy. - carpal tunnel release. - coronary artery bypass. - appendectomy. -  cardiac cath. - coronary angioplasty. - thrombectomy. - knee arthroplasty. - popliteal artery stent. 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 no acute distress. Vitals Time Taken: 2:59 AM, Height: 70 in, Weight: 260 lbs, BMI: 37.3, Temperature: 98.1 F, Pulse: 64 bpm, Respiratory Rate: 20 breaths/min, Blood Pressure: 168/77 mmHg. Respiratory Normal work of breathing on room air. General Notes: 11/26/2022: The wound is smaller again today. He is responding well to the offloading effects of total contact casting. Integumentary (Hair, Skin) Wound #2 status is Open. Original cause of wound was Gradually Appeared. The date acquired was: 07/08/2022. The wound has been in treatment 12 weeks. The wound is located on the Right,Plantar Foot. The wound measures 1.1cm length x 1.4cm width x 0.1cm depth; 1.21cm^2 area and 0.121cm^3 volume. There is Fat Layer (Subcutaneous Tissue) exposed. There is no tunneling or undermining noted. There is [name] medium amount of serosanguineous drainage noted. The wound margin is distinct with the outline attached to the wound base. There is medium (34-66%) red granulation within the wound bed. There is [name] medium (34- 66%) amount of necrotic tissue within the wound bed including Adherent Slough. The periwound skin appearance had no abnormalities noted for moisture. The periwound skin appearance had no abnormalities noted for color. The periwound skin appearance exhibited: Callus. Periwound temperature was noted as No Abnormality. Assessment Active Problems ICD-10 Non-pressure chronic ulcer of other part of right foot with fat  layer exposed Peripheral vascular disease, unspecified Atherosclerotic heart disease of native coronary artery without  angina pectoris Essential (primary) hypertension Type 2 diabetes mellitus with hyperglycemia Type 2 diabetes mellitus with diabetic neuropathy, unspecified Cerebral infarction, unspecified Procedures Wound #2 Pre-procedure diagnosis of Wound #2 is [name] Diabetic Wound/Ulcer of the Lower Extremity located on the Right,Plantar Foot . There was [name] T Contact Cast otal Procedure by Duanne Guess, MD. Post procedure Diagnosis Wound #2: Same as Pre-Procedure Notes: Scribed for Dr. Lady Gary by Brenton Grills, RN. Plan Follow-up Appointments: Return Appointment in 1 week. - Dr. Lady Gary - room 2 Anesthetic: (In clinic) Topical Lidocaine 4% applied to wound bed Bathing/ Shower/ Hygiene: May shower with protection but do not get wound dressing(s) wet. Protect dressing(s) with water repellant cover (for example, large plastic bag) or [name] cast cover and may then take shower. Edema Control - Lymphedema / SCD / Other: Elevate legs to the level of the heart or above for 30 minutes daily and/or when sitting for 3-4 times [name] day throughout the day. Avoid standing for long periods of time. Patient to wear own compression stockings every day. Moisturize legs daily. Off-Loading: T Contact Cast to Right Lower Extremity otal [name], [name] ([id number]) [id number] Page 7 of 9 Removable cast walker boot to: - right leg Non Wound Condition: Other Non Wound Condition Orders/Instructions: - 3 layer wrap with silver alginate over blister WOUND #2: - Foot Wound Laterality: Plantar, Right Cleanser: Soap and Water 1 x Per Week/30 Days Discharge Instructions: May shower and wash wound with dial antibacterial soap and water prior to dressing change. Cleanser: Wound Cleanser 1 x Per Week/30 Days Discharge Instructions: Cleanse the wound with wound cleanser prior to applying [name] clean dressing using gauze sponges, not tissue or cotton balls. Peri-Wound Care: Zinc Oxide Ointment 30g tube 1 x Per  Week/30 Days Discharge Instructions: Apply Zinc Oxide to periwound with each dressing change Prim Dressing: Hydrofera Blue Ready Transfer Foam, 2.5x2.5 (in/in) 1 x Per Week/30 Days ary Discharge Instructions: Apply directly to wound bed as directed Secondary Dressing: Woven Gauze Sponge, Non-Sterile 4x4 in 1 x Per Week/30 Days Discharge Instructions: Apply over primary dressing as directed. Secondary Dressing: Zetuvit Plus 4x8 in 1 x Per Week/30 Days Discharge Instructions: Apply over primary dressing as directed. Secured With: [employer], 4.5x3.1 (in/yd) 1 x Per Week/30 Days Discharge Instructions: Secure with Kerlix as directed. 11/26/2022: The wound is smaller again today. He is responding well to the offloading effects of total contact casting. The wound bed was prepared for total contact casting. Hydrofera Blue was used as the contact layer with periwound zinc oxide and an absorbent pad. The total contact cast was then applied in standard fashion. He will follow-up in 1 week. Electronic Signature(s) Signed: 11/26/2022 4:12:21 PM By: Duanne Guess MD FACS Entered By: Duanne Guess on 11/26/2022 16:12:21 -------------------------------------------------------------------------------- HxROS Details Patient Name: Date of Service: [name], [name]. 11/26/2022 2:45 PM Medical Record Number: [id number] Patient Account Number: [id number] Date of Birth/Sex: Treating RN: [birth year] (67 y.o. M) Primary Care Provider: Arva Chafe Other Clinician: Referring Provider: Treating Provider/Extender: [name] in Treatment: 12 Information Obtained From Patient Respiratory Medical History: Positive for: Sleep Apnea Cardiovascular Medical History: Positive for: Arrhythmia - [name]-Fib; Coronary Artery Disease - s/p CABG; Deep Vein Thrombosis; Hypertension; Myocardial Infarction; Peripheral Arterial Disease - s/p Fempop x 2 Past Medical History Notes: CVA x  3 Endocrine Medical History: Positive for: Type II Diabetes Time with diabetes: since 1997  Treated with: Insulin, Oral agents Blood sugar tested every day: Yes Tested : 2-3x per day Musculoskeletal Medical History: Past Medical History Notes: carpal tunnel syndrome Neurologic Medical History: Positive for: Neuropathy Past Medical History Notes: stroke [name], [name] ([id number]) [id number] Page 8 of 9 Oncologic Medical History: Past Medical History Notes: skin cancer Immunizations Pneumococcal Vaccine: Received Pneumococcal Vaccination: Yes Received Pneumococcal Vaccination On or After 60th Birthday: Yes Implantable Devices None Hospitalization / Surgery History Type of Hospitalization/Surgery colonoscopy polypectomy peripheral vascular cath shoulder arthroscopy carpal tunnel release coronary artery bypass appendectomy cardiac cath coronary angioplasty thrombectomy knee arthroplasty popliteal artery stent Family and Social History Unknown History: Yes; Never smoker; Marital Status - Single; Alcohol Use: Never; Drug Use: No History; Caffeine Use: Rarely; Financial Concerns: No; Food, Clothing or Shelter Needs: No; Support System Lacking: No; Transportation Concerns: No Electronic Signature(s) Signed: 11/26/2022 4:15:25 PM By: Duanne Guess MD FACS Entered By: Duanne Guess on 11/26/2022 16:08:11 -------------------------------------------------------------------------------- Total Contact Cast Details Patient Name: Date of Service: [name] Sabal [name]. 11/26/2022 2:45 PM Medical Record Number: [id number] Patient Account Number: [id number] Date of Birth/Sex: Treating RN: 11/28/1955 (67 y.o. M) Primary Care Provider: Arva Chafe Other Clinician: Referring Provider: Treating Provider/Extender: [name] in Treatment: 12 T Contact Cast Applied for Wound Assessment: otal Wound #2 Right,Plantar  Foot Performed By: Physician Duanne Guess, MD Post Procedure Diagnosis Same as Pre-procedure Notes Scribed for Dr. Lady Gary by Brenton Grills, RN Electronic Signature(s) Signed: 11/26/2022 3:42:36 PM By: [place] Scrape Signed: 11/26/2022 4:15:25 PM By: Duanne Guess MD FACS Entered By: Dayton Scrape on 11/26/2022 15:26:24 -------------------------------------------------------------------------------- SuperBill Details Patient Name: Date of Service: [name], [name]. 11/26/2022 Medical Record Number: [id number] Patient Account Number: [id number] Date of Birth/Sex: Treating RN: 1955/11/17 (67 y.o. M) Primary Care Provider: Arva Chafe Other Clinician: [name] ([id number]) [id number] Page 9 of 9 Referring Provider: Treating Provider/Extender: [name] in Treatment: 12 Diagnosis Coding ICD-10 Codes Code Description [phone] Non-pressure chronic ulcer of other part of right foot with fat layer exposed I73.9 Peripheral vascular disease, unspecified I25.10 Atherosclerotic heart disease of native coronary artery without angina pectoris I10 Essential (primary) hypertension E11.65 Type 2 diabetes mellitus with hyperglycemia E11.40 Type 2 diabetes mellitus with diabetic neuropathy, unspecified I63.9 Cerebral infarction, unspecified Facility Procedures : CPT4 Code: [id number] Description: 29445 - APPLY TOTAL CONTACT LEG CAST ICD-10 Diagnosis Description L97.512 Non-pressure chronic ulcer of other part of right foot with fat layer exposed Modifier: Quantity: 1 Physician Procedures : CPT4 Code Description Modifier [id number] - WC PHYS LEVEL 3 - EST PT ICD-10 Diagnosis Description L97.512 Non-pressure chronic ulcer of other part of right foot with fat layer exposed I73.9 Peripheral vascular disease, unspecified E11.65 Type 2  diabetes mellitus with hyperglycemia E11.40 Type 2 diabetes mellitus with diabetic neuropathy,  unspecified Quantity: 1 : [id number] - WC PHYS APPLY TOTAL CONTACT CAST ICD-10 Diagnosis Description L97.512 Non-pressure chronic ulcer of other part of right foot with fat layer exposed Quantity: 1 Electronic Signature(s) Signed: 11/26/2022 4:13:01 PM By: Duanne Guess MD FACS Previous Signature: 11/26/2022 3:42:36 PM Version By: Dayton Scrape Entered By: Duanne Guess on 11/26/2022 16:13:01

## 2022-11-26 NOTE — Progress Notes (Addendum)
[name] ([id number]) [id number] Page 1 of 6 Visit Report for 11/26/2022 Arrival Information Details Patient Name: Date of Service: [name]. 11/26/2022 2:45 PM Medical Record Number: [id number] Patient Account Number: [id number] Date of Birth/Sex: Treating RN: [birth year] (67 y.o. M) Primary Care [name]: [name] Other Clinician: Referring [name]: Treating [name]/Extender: [name] in Treatment: 12 Visit Information History Since Last Visit All ordered tests and consults were completed: [name] Patient Arrived: Ambulatory Added or deleted any medications: [name] Arrival Time: 14:59 Any new allergies or adverse reactions: [name] Accompanied By: friend Had [name] fall or experienced change in [name] Transfer Assistance: None activities of daily living that may affect Patient Identification Verified: Yes risk of falls: Secondary Verification Process Completed: Yes Signs or symptoms of abuse/neglect since last visito [name] Patient Requires Transmission-Based Precautions: [name] Hospitalized since last visit: [name] Patient Has Alerts: [name] Implantable device outside of the clinic excluding [name] cellular tissue based products placed in the center since last visit: Pain Present Now: [name] Electronic Signature(s) Signed: 11/26/2022 3:42:36 PM By: [name] Entered By: [name] on 11/26/2022 14:59:48 -------------------------------------------------------------------------------- Lower Extremity Assessment Details Patient Name: Date of Service: [name], [name]. 11/26/2022 2:45 PM Medical Record Number: [id number] Patient Account Number: [id number] Date of Birth/Sex: Treating RN: [birth year] (67 y.o. M) Primary Care Dinora Hemm: [name] Other Clinician: Referring [name]: Treating [name]: [name] in Treatment: 12 Edema Assessment Assessed: [name]] [Left: Edema] [Right:  :] Calf Left: Right: Point of Measurement: From Medial Instep 38.8 cm Ankle Left: Right: Point of Measurement: From Medial Instep 27.2 cm Vascular Assessment Pulses: Dorsalis Pedis Palpable: [Right:Yes] Electronic Signature(s) Signed: 11/26/2022 3:42:36 PM By: [name] Entered By: [name] on 11/26/2022 15:13:50 [name] ([id number]) [id number] Page 2 of 6 -------------------------------------------------------------------------------- Multi Wound Chart Details Patient Name: Date of Service: [name]. 11/26/2022 2:45 PM Medical Record Number: [id number] Patient Account Number: [id number] Date of Birth/Sex: Treating RN: September 06, 1955 (67 y.o. M) Primary Care Aireana Ryland: [name] Other Clinician: Referring [name]: Treating [name]: [name] in Treatment: 12 Vital Signs Height(in): 70 Pulse(bpm): 64 Weight(lbs): 260 Blood Pressure(mmHg): 168/77 Body Mass Index(BMI): 37.3 Temperature(F): 98.1 Respiratory Rate(breaths/min): 20 [2:Photos: [name] Photos Right, Plantar Foot Wound Location: Gradually Appeared Wounding Event: Diabetic Wound/Ulcer of the Lower Primary Etiology: Extremity Sleep Apnea, Arrhythmia, Coronary [name] Comorbid History: Artery Disease, Deep Vein Thrombosis, Hypertension,  Myocardial Infarction, Peripheral Arterial Disease, Type II Diabetes, Neuropathy 07/08/2022 Date Acquired: 12 Weeks of Treatment: Open Wound Status: [name] Wound Recurrence: 1.1x1.4x0.1 Measurements L x W x D (cm) 1.21 [name] : rea 0.121 Volume (cm) :  52.60% % Reduction in [name] rea: 52.50% % Reduction in Volume: Grade 1 Classification: Medium Exudate [name] mount: Serosanguineous Exudate Type: red, brown Exudate Color: Distinct, outline attached Wound Margin: Medium (34-66%) Granulation [name] mount: Red  Granulation Quality: Medium (34-66%) Necrotic [name] mount: Fat Layer (Subcutaneous Tissue): Yes [name] Exposed Structures: Fascia: [name]  Tendon: [name] Muscle: [name] Joint: [name] Bone: [name] Medium (34-66%) Epithelialization: Callus: Yes Periwound Skin Texture: [name]  Abnormalities Noted Periwound Skin Moisture: [name] Abnormalities Noted Periwound Skin Color: [name] Abnormality Temperature: T Contact Cast otal Procedures Performed:] [[name]:[name] [name] [name] [name] [name] [name] [name] [name] [name] [name] [name] [name] [name] [name] [name] [name] [name] [name] [name] [name] [name] [name]  [name] [name] [name] [name] [name]] Treatment Notes Wound #2 (Foot) Wound Laterality: Plantar, Right Cleanser Soap and Water Discharge Instruction: May shower and wash wound with  dial antibacterial soap and water prior to dressing change. Wound Cleanser Discharge Instruction: Cleanse the wound with wound cleanser prior to applying [name] clean dressing using gauze sponges, not tissue or cotton balls. Peri-Wound Care Zinc Oxide Ointment 30g tube Discharge Instruction: Apply Zinc Oxide to periwound with each dressing change Topical Primary Dressing Hydrofera Blue Ready Transfer Foam, 2.5x2.5 (in/in) Discharge Instruction: Apply directly to wound bed as directed [name], [name] ([id number]) [id number] Page 3 of 6 Secondary Dressing Woven Gauze Sponge, Non-Sterile 4x4 in Discharge Instruction: Apply over primary dressing as directed. Zetuvit Plus 4x8 in Discharge Instruction: Apply over primary dressing as directed. Secured With [employer], 4.5x3.1 (in/yd) Discharge Instruction: Secure with Kerlix as directed. Compression Wrap Compression Stockings Add-Ons Electronic Signature(s) Signed: 11/26/2022 4:07:24 PM By: Duanne Guess MD FACS Entered By: Duanne [name] on 11/26/2022 16:07:24 -------------------------------------------------------------------------------- Multi-Disciplinary Care Plan Details Patient Name: Date of Service: [name]. 11/26/2022 2:45 PM Medical Record Number: [id number] Patient Account Number: [id number] Date of Birth/Sex: Treating RN: [birth year] (67 y.o. M) Primary Care Veora Fonte:  [name] Other Clinician: Referring Gracin Soohoo: Treating Ermie Glendenning/Extender: [name] in Treatment: 12 Active Inactive Abuse / Safety / Falls / Self Care Management Nursing Diagnoses: Impaired physical mobility Potential for falls Goals: Patient will not experience any injury related to falls Date Initiated: 08/31/2022 Target Resolution Date: 12/18/2022 Goal Status: Active Patient/caregiver will verbalize/demonstrate measures taken to improve the patient's personal safety Date Initiated: 08/31/2022 Target Resolution Date: 01/15/2023 Goal Status: Active Interventions: Provide education on basic hygiene Provide education on fall prevention Notes: Wound/Skin Impairment Nursing Diagnoses: Impaired tissue integrity Knowledge deficit related to ulceration/compromised skin integrity Goals: Patient/caregiver will verbalize understanding of skin care regimen Date Initiated: 08/31/2022 Target Resolution Date: 12/19/2022 Goal Status: Active Interventions: Assess patient/caregiver ability to obtain necessary supplies Assess patient/caregiver ability to perform ulcer/skin care regimen upon admission and as needed Assess ulceration(s) every visit Treatment Activities: Skin care regimen initiated : 08/31/2022 [name] ([id number]) [phone].pdf Page 4 of 6 Topical wound management initiated : 08/31/2022 Notes: Electronic Signature(s) Signed: 11/26/2022 3:42:36 PM By: [name] Entered By: [name] on 11/26/2022 15:27:14 -------------------------------------------------------------------------------- Pain Assessment Details Patient Name: Date of Service: [name], [name]. 11/26/2022 2:45 PM Medical Record Number: [id number] Patient Account Number: [id number] Date of Birth/Sex: Treating RN: April 07, 1956 (67 y.o. M) Primary Care Madyson Lukach: [name] Other Clinician: Referring Keyonta Barradas: Treating Jarelly Rinck/Extender: [name] in Treatment: 12 Active Problems Location of Pain Severity and Description of Pain Patient Has Paino [name] Site Locations Pain Management and Medication Current Pain Management: Electronic Signature(s) Signed: 11/26/2022 3:42:36 PM By: [name] Entered By: [name] on 11/26/2022 15:00:16 -------------------------------------------------------------------------------- Patient/Caregiver Education Details Patient Name: Date of Service: [name] 5/9/2024andnbsp2:45 PM Medical Record Number: [id number] Patient Account Number: [id number] Date of Birth/Gender: Treating RN: 06-Dec-1955 (67 y.o. M) Primary Care Physician: [name] Other Clinician: Referring Physician: Treating Physician/Extender: [name] in Treatment: 12 Education Assessment Education Provided To: Patient Education Topics Provided Wound/Skin Impairment: Methods: Explain/Verbal Responses: State content correctly [name] ([id number]) [id number].pdf Page 5 of 6 Electronic Signature(s) Signed: 11/26/2022 3:42:36 PM By: [name] Entered By: [name] on 11/26/2022 15:27:26 -------------------------------------------------------------------------------- Wound Assessment Details Patient Name: Date of Service: [name], [name]. 11/26/2022 2:45 PM Medical Record Number: [id number] Patient Account Number: [id number] Date of Birth/Sex: Treating RN: October 23, 1955 (67 y.o. M) Primary Care Denim Start: [name] Other Clinician: Referring Nonnie Pickney: Treating Nesa Distel/Extender: Tennis Must,  Nicholas Weeks in Treatment: 12 Wound Status Wound Number: 2 Primary Diabetic Wound/Ulcer of the Lower Extremity Etiology: Wound Location: Right, Plantar Foot Wound Open Wounding Event: Gradually Appeared Status: Date Acquired: 07/08/2022 Comorbid Sleep Apnea, Arrhythmia, Coronary Artery Disease, Deep Vein Weeks  Of Treatment: 12 History: Thrombosis, Hypertension, Myocardial Infarction, Peripheral Arterial Clustered Wound: [name] Disease, Type II Diabetes, Neuropathy Wound Measurements Length: (cm) 1.1 Width: (cm) 1.4 Depth: (cm) 0.1 Area: (cm) 1.21 Volume: (cm) 0.121 % Reduction in Area: 52.6% % Reduction in Volume: 52.5% Epithelialization: Medium (34-66%) Tunneling: [name] Undermining: [name] Wound Description Classification: Grade 1 Wound Margin: Distinct, outline attached Exudate Amount: Medium Exudate Type: Serosanguineous Exudate Color: red, brown Foul Odor After Cleansing: [name] Slough/Fibrino Yes Wound Bed Granulation Amount: Medium (34-66%) Exposed Structure Granulation Quality: Red Fascia Exposed: [name] Necrotic Amount: Medium (34-66%) Fat Layer (Subcutaneous Tissue) Exposed: Yes Necrotic Quality: Adherent Slough Tendon Exposed: [name] Muscle Exposed: [name] Joint Exposed: [name] Bone Exposed: [name] Periwound Skin Texture Texture Color [name] Abnormalities Noted: [name] [name] Abnormalities Noted: Yes Callus: Yes Temperature / Pain Temperature: [name] Abnormality Moisture [name] Abnormalities Noted: Yes Treatment Notes Wound #2 (Foot) Wound Laterality: Plantar, Right Cleanser Soap and Water Discharge Instruction: May shower and wash wound with dial antibacterial soap and water prior to dressing change. Wound Cleanser Discharge Instruction: Cleanse the wound with wound cleanser prior to applying [name] clean dressing using gauze sponges, not tissue or cotton balls. Peri-Wound Care Zinc Oxide Ointment 30g tube Discharge Instruction: Apply Zinc Oxide to periwound with each dressing change Topical [name] ([id number]) [phone].pdf Page 6 of 6 Primary Dressing Hydrofera Blue Ready Transfer Foam, 2.5x2.5 (in/in) Discharge Instruction: Apply directly to wound bed as directed Secondary Dressing Woven Gauze Sponge, Non-Sterile 4x4 in Discharge Instruction: Apply over primary dressing as  directed. Zetuvit Plus 4x8 in Discharge Instruction: Apply over primary dressing as directed. Secured With [employer], 4.5x3.1 (in/yd) Discharge Instruction: Secure with Kerlix as directed. Compression Wrap Compression Stockings Add-Ons Electronic Signature(s) Signed: 11/26/2022 3:42:36 PM By: [name] Entered By: [name] on 11/26/2022 15:11:10 -------------------------------------------------------------------------------- Vitals Details Patient Name: Date of Service: [name] Pian, [name]. 11/26/2022 2:45 PM Medical Record Number: [id number] Patient Account Number: [id number] Date of Birth/Sex: Treating RN: [birth year] (67 y.o. M) Primary Care Meila Berke: [name] Other Clinician: Referring Cherye Gaertner: Treating Desiraye Rolfson/Extender: [name] in Treatment: 12 Vital Signs Time Taken: 02:59 Temperature (F): 98.1 Height (in): 70 Pulse (bpm): 64 Weight (lbs): 260 Respiratory Rate (breaths/min): 20 Body Mass Index (BMI): 37.3 Blood Pressure (mmHg): 168/77 Reference Range: 80 - 120 mg / dl Electronic Signature(s) Signed: 11/26/2022 3:42:36 PM By: [name] Entered By: [name] on 11/26/2022 15:00:10

## 2022-12-01 ENCOUNTER — Telehealth: Payer: Self-pay | Admitting: Internal Medicine

## 2022-12-01 NOTE — Telephone Encounter (Signed)
Patient said that Alver Fisher is in the process of sending in patient assistance application for Eliquis. Says that once fax come in, wants a phone call so that he can come in and fill his part of application.

## 2022-12-01 NOTE — Telephone Encounter (Signed)
Called patient and LVM that will send this over to pharmacy for PA and will let him know when information is needed

## 2022-12-02 NOTE — Telephone Encounter (Signed)
Not actually a prior authorization. Is waiting on a form to be faxed. But it can actually be printed offline and patient can come in and sign whenever he is available.

## 2022-12-02 NOTE — Telephone Encounter (Signed)
Patient called and will come to our office to sign forms in the morning.  At front desk for patient and then to give to pharmacy for completion

## 2022-12-03 ENCOUNTER — Encounter (HOSPITAL_BASED_OUTPATIENT_CLINIC_OR_DEPARTMENT_OTHER): Payer: Medicare HMO | Admitting: General Surgery

## 2022-12-03 DIAGNOSIS — I5032 Chronic diastolic (congestive) heart failure: Secondary | ICD-10-CM | POA: Diagnosis not present

## 2022-12-03 DIAGNOSIS — I2510 Atherosclerotic heart disease of native coronary artery without angina pectoris: Secondary | ICD-10-CM | POA: Diagnosis not present

## 2022-12-03 DIAGNOSIS — E785 Hyperlipidemia, unspecified: Secondary | ICD-10-CM | POA: Diagnosis not present

## 2022-12-03 DIAGNOSIS — Z85828 Personal history of other malignant neoplasm of skin: Secondary | ICD-10-CM | POA: Diagnosis not present

## 2022-12-03 DIAGNOSIS — I4891 Unspecified atrial fibrillation: Secondary | ICD-10-CM | POA: Diagnosis not present

## 2022-12-03 DIAGNOSIS — Z7902 Long term (current) use of antithrombotics/antiplatelets: Secondary | ICD-10-CM | POA: Diagnosis not present

## 2022-12-03 DIAGNOSIS — G4733 Obstructive sleep apnea (adult) (pediatric): Secondary | ICD-10-CM | POA: Diagnosis not present

## 2022-12-03 DIAGNOSIS — I252 Old myocardial infarction: Secondary | ICD-10-CM | POA: Diagnosis not present

## 2022-12-03 DIAGNOSIS — L97512 Non-pressure chronic ulcer of other part of right foot with fat layer exposed: Secondary | ICD-10-CM | POA: Diagnosis not present

## 2022-12-03 DIAGNOSIS — E11621 Type 2 diabetes mellitus with foot ulcer: Secondary | ICD-10-CM | POA: Diagnosis not present

## 2022-12-03 DIAGNOSIS — Z7901 Long term (current) use of anticoagulants: Secondary | ICD-10-CM | POA: Diagnosis not present

## 2022-12-03 DIAGNOSIS — I110 Hypertensive heart disease with heart failure: Secondary | ICD-10-CM | POA: Diagnosis not present

## 2022-12-03 DIAGNOSIS — Z8673 Personal history of transient ischemic attack (TIA), and cerebral infarction without residual deficits: Secondary | ICD-10-CM | POA: Diagnosis not present

## 2022-12-03 DIAGNOSIS — E1165 Type 2 diabetes mellitus with hyperglycemia: Secondary | ICD-10-CM | POA: Diagnosis not present

## 2022-12-03 DIAGNOSIS — E1151 Type 2 diabetes mellitus with diabetic peripheral angiopathy without gangrene: Secondary | ICD-10-CM | POA: Diagnosis not present

## 2022-12-03 DIAGNOSIS — E1142 Type 2 diabetes mellitus with diabetic polyneuropathy: Secondary | ICD-10-CM | POA: Diagnosis not present

## 2022-12-08 ENCOUNTER — Other Ambulatory Visit: Payer: Self-pay | Admitting: Gastroenterology

## 2022-12-10 ENCOUNTER — Encounter (HOSPITAL_BASED_OUTPATIENT_CLINIC_OR_DEPARTMENT_OTHER): Payer: Medicare HMO | Admitting: General Surgery

## 2022-12-10 DIAGNOSIS — I110 Hypertensive heart disease with heart failure: Secondary | ICD-10-CM | POA: Diagnosis not present

## 2022-12-10 DIAGNOSIS — Z7901 Long term (current) use of anticoagulants: Secondary | ICD-10-CM | POA: Diagnosis not present

## 2022-12-10 DIAGNOSIS — E11621 Type 2 diabetes mellitus with foot ulcer: Secondary | ICD-10-CM | POA: Diagnosis not present

## 2022-12-10 DIAGNOSIS — E1151 Type 2 diabetes mellitus with diabetic peripheral angiopathy without gangrene: Secondary | ICD-10-CM | POA: Diagnosis not present

## 2022-12-10 DIAGNOSIS — Z85828 Personal history of other malignant neoplasm of skin: Secondary | ICD-10-CM | POA: Diagnosis not present

## 2022-12-10 DIAGNOSIS — E785 Hyperlipidemia, unspecified: Secondary | ICD-10-CM | POA: Diagnosis not present

## 2022-12-10 DIAGNOSIS — Z8673 Personal history of transient ischemic attack (TIA), and cerebral infarction without residual deficits: Secondary | ICD-10-CM | POA: Diagnosis not present

## 2022-12-10 DIAGNOSIS — L97512 Non-pressure chronic ulcer of other part of right foot with fat layer exposed: Secondary | ICD-10-CM | POA: Diagnosis not present

## 2022-12-10 DIAGNOSIS — E1165 Type 2 diabetes mellitus with hyperglycemia: Secondary | ICD-10-CM | POA: Diagnosis not present

## 2022-12-10 DIAGNOSIS — I5032 Chronic diastolic (congestive) heart failure: Secondary | ICD-10-CM | POA: Diagnosis not present

## 2022-12-10 DIAGNOSIS — I252 Old myocardial infarction: Secondary | ICD-10-CM | POA: Diagnosis not present

## 2022-12-10 DIAGNOSIS — I4891 Unspecified atrial fibrillation: Secondary | ICD-10-CM | POA: Diagnosis not present

## 2022-12-10 DIAGNOSIS — I2510 Atherosclerotic heart disease of native coronary artery without angina pectoris: Secondary | ICD-10-CM | POA: Diagnosis not present

## 2022-12-10 DIAGNOSIS — E1142 Type 2 diabetes mellitus with diabetic polyneuropathy: Secondary | ICD-10-CM | POA: Diagnosis not present

## 2022-12-10 DIAGNOSIS — Z7902 Long term (current) use of antithrombotics/antiplatelets: Secondary | ICD-10-CM | POA: Diagnosis not present

## 2022-12-10 DIAGNOSIS — G4733 Obstructive sleep apnea (adult) (pediatric): Secondary | ICD-10-CM | POA: Diagnosis not present

## 2022-12-13 DIAGNOSIS — M5136 Other intervertebral disc degeneration, lumbar region: Secondary | ICD-10-CM | POA: Diagnosis not present

## 2022-12-13 DIAGNOSIS — Z8673 Personal history of transient ischemic attack (TIA), and cerebral infarction without residual deficits: Secondary | ICD-10-CM | POA: Diagnosis not present

## 2022-12-13 DIAGNOSIS — M545 Low back pain, unspecified: Secondary | ICD-10-CM | POA: Diagnosis not present

## 2022-12-13 DIAGNOSIS — R030 Elevated blood-pressure reading, without diagnosis of hypertension: Secondary | ICD-10-CM | POA: Diagnosis not present

## 2022-12-13 DIAGNOSIS — L97519 Non-pressure chronic ulcer of other part of right foot with unspecified severity: Secondary | ICD-10-CM | POA: Diagnosis not present

## 2022-12-13 DIAGNOSIS — M549 Dorsalgia, unspecified: Secondary | ICD-10-CM | POA: Diagnosis not present

## 2022-12-13 DIAGNOSIS — M62830 Muscle spasm of back: Secondary | ICD-10-CM | POA: Diagnosis not present

## 2022-12-13 DIAGNOSIS — E119 Type 2 diabetes mellitus without complications: Secondary | ICD-10-CM | POA: Diagnosis not present

## 2022-12-13 DIAGNOSIS — M47817 Spondylosis without myelopathy or radiculopathy, lumbosacral region: Secondary | ICD-10-CM | POA: Diagnosis not present

## 2022-12-13 DIAGNOSIS — I2510 Atherosclerotic heart disease of native coronary artery without angina pectoris: Secondary | ICD-10-CM | POA: Diagnosis not present

## 2022-12-13 DIAGNOSIS — I10 Essential (primary) hypertension: Secondary | ICD-10-CM | POA: Diagnosis not present

## 2022-12-14 DIAGNOSIS — G4733 Obstructive sleep apnea (adult) (pediatric): Secondary | ICD-10-CM | POA: Diagnosis not present

## 2022-12-14 DIAGNOSIS — M47817 Spondylosis without myelopathy or radiculopathy, lumbosacral region: Secondary | ICD-10-CM | POA: Diagnosis not present

## 2022-12-14 DIAGNOSIS — M5136 Other intervertebral disc degeneration, lumbar region: Secondary | ICD-10-CM | POA: Diagnosis not present

## 2022-12-16 DIAGNOSIS — E1165 Type 2 diabetes mellitus with hyperglycemia: Secondary | ICD-10-CM | POA: Diagnosis not present

## 2022-12-16 DIAGNOSIS — I10 Essential (primary) hypertension: Secondary | ICD-10-CM | POA: Diagnosis not present

## 2022-12-16 DIAGNOSIS — L97519 Non-pressure chronic ulcer of other part of right foot with unspecified severity: Secondary | ICD-10-CM | POA: Diagnosis not present

## 2022-12-16 DIAGNOSIS — E7800 Pure hypercholesterolemia, unspecified: Secondary | ICD-10-CM | POA: Diagnosis not present

## 2022-12-16 DIAGNOSIS — I2510 Atherosclerotic heart disease of native coronary artery without angina pectoris: Secondary | ICD-10-CM | POA: Diagnosis not present

## 2022-12-18 ENCOUNTER — Encounter: Payer: Self-pay | Admitting: Family Medicine

## 2022-12-18 ENCOUNTER — Ambulatory Visit (INDEPENDENT_AMBULATORY_CARE_PROVIDER_SITE_OTHER): Payer: Medicare HMO | Admitting: Family Medicine

## 2022-12-18 VITALS — BP 130/84 | HR 52 | Temp 98.5°F | Ht 70.0 in | Wt 270.0 lb

## 2022-12-18 DIAGNOSIS — M545 Low back pain, unspecified: Secondary | ICD-10-CM | POA: Diagnosis not present

## 2022-12-18 DIAGNOSIS — I10 Essential (primary) hypertension: Secondary | ICD-10-CM | POA: Diagnosis not present

## 2022-12-18 DIAGNOSIS — Z0000 Encounter for general adult medical examination without abnormal findings: Secondary | ICD-10-CM | POA: Diagnosis not present

## 2022-12-18 MED ORDER — METHOCARBAMOL 500 MG PO TABS: 500.0000 mg | ORAL_TABLET | Freq: Four times a day (QID) | ORAL | 1 refills | Status: DC | PRN
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
## 2022-12-18 NOTE — Patient Instructions (Addendum)
Keep the diet clean and stay active.  Please get me a copy of your advanced directive form at your convenience.   Heat (pad or rice pillow in microwave) over affected area, 10-15 minutes twice daily.   Ice/cold pack over area for 10-15 min twice daily.  OK to take Tylenol 1000 mg (2 extra strength tabs) or 975 mg (3 regular strength tabs) every 6 hours as needed.  If you do not hear anything about your referral in the next 1-2 weeks, call our office and ask for an update.  Let us know if you need anything.  EXERCISES  RANGE OF MOTION (ROM) AND STRETCHING EXERCISES - Low Back Pain Most people with lower back pain will find that their symptoms get worse with excessive bending forward (flexion) or arching at the lower back (extension). The exercises that will help resolve your symptoms will focus on the opposite motion.  If you have pain, numbness or tingling which travels down into your buttocks, leg or foot, the goal of the therapy is for these symptoms to move closer to your back and eventually resolve. Sometimes, these leg symptoms will get better, but your lower back pain may worsen. This is often an indication of progress in your rehabilitation. Be very alert to any changes in your symptoms and the activities in which you participated in the 24 hours prior to the change. Sharing this information with your caregiver will allow him or her to most efficiently treat your condition. These exercises may help you when beginning to rehabilitate your injury. Your symptoms may resolve with or without further involvement from your physician, physical therapist or athletic trainer. While completing these exercises, remember:  Restoring tissue flexibility helps normal motion to return to the joints. This allows healthier, less painful movement and activity. An effective stretch should be held for at least 30 seconds. A stretch should never be painful. You should only feel a gentle lengthening or release  in the stretched tissue. FLEXION RANGE OF MOTION AND STRETCHING EXERCISES:  STRETCH - Flexion, Single Knee to Chest  Lie on a firm bed or floor with both legs extended in front of you. Keeping one leg in contact with the floor, bring your opposite knee to your chest. Hold your leg in place by either grabbing behind your thigh or at your knee. Pull until you feel a gentle stretch in your low back. Hold 30 seconds. Slowly release your grasp and repeat the exercise with the opposite side. Repeat 2 times. Complete this exercise 3 times per week.   STRETCH - Flexion, Double Knee to Chest Lie on a firm bed or floor with both legs extended in front of you. Keeping one leg in contact with the floor, bring your opposite knee to your chest. Tense your stomach muscles to support your back and then lift your other knee to your chest. Hold your legs in place by either grabbing behind your thighs or at your knees. Pull both knees toward your chest until you feel a gentle stretch in your low back. Hold 30 seconds. Tense your stomach muscles and slowly return one leg at a time to the floor. Repeat 2 times. Complete this exercise 3 times per week.   STRETCH - Low Trunk Rotation Lie on a firm bed or floor. Keeping your legs in front of you, bend your knees so they are both pointed toward the ceiling and your feet are flat on the floor. Extend your arms out to the side. This will  stabilize your upper body by keeping your shoulders in contact with the floor. Gently and slowly drop both knees together to one side until you feel a gentle stretch in your low back. Hold for 30 seconds. Tense your stomach muscles to support your lower back as you bring your knees back to the starting position. Repeat the exercise to the other side. Repeat 2 times. Complete this exercise at least 3 times per week.   EXTENSION RANGE OF MOTION AND FLEXIBILITY EXERCISES:  STRETCH - Extension, Prone on Elbows  Lie on your stomach on  the floor, a bed will be too soft. Place your palms about shoulder width apart and at the height of your head. Place your elbows under your shoulders. If this is too painful, stack pillows under your chest. Allow your body to relax so that your hips drop lower and make contact more completely with the floor. Hold this position for 30 seconds. Slowly return to lying flat on the floor. Repeat 2 times. Complete this exercise 3 times per week.   RANGE OF MOTION - Extension, Prone Press Ups Lie on your stomach on the floor, a bed will be too soft. Place your palms about shoulder width apart and at the height of your head. Keeping your back as relaxed as possible, slowly straighten your elbows while keeping your hips on the floor. You may adjust the placement of your hands to maximize your comfort. As you gain motion, your hands will come more underneath your shoulders. Hold this position 30 seconds. Slowly return to lying flat on the floor. Repeat 2 times. Complete this exercise 3 times per week.   RANGE OF MOTION- Quadruped, Neutral Spine  Assume a hands and knees position on a firm surface. Keep your hands under your shoulders and your knees under your hips. You may place padding under your knees for comfort. Drop your head and point your tailbone toward the ground below you. This will round out your lower back like an angry cat. Hold this position for 30 seconds. Slowly lift your head and release your tail bone so that your back sags into a large arch, like an old horse. Hold this position for 30 seconds. Repeat this until you feel limber in your low back. Now, find your "sweet spot." This will be the most comfortable position somewhere between the two previous positions. This is your neutral spine. Once you have found this position, tense your stomach muscles to support your low back. Hold this position for 30 seconds. Repeat 2 times. Complete this exercise 3 times per week.   STRENGTHENING  EXERCISES - Low Back Sprain These exercises may help you when beginning to rehabilitate your injury. These exercises should be done near your "sweet spot." This is the neutral, low-back arch, somewhere between fully rounded and fully arched, that is your least painful position. When performed in this safe range of motion, these exercises can be used for people who have either a flexion or extension based injury. These exercises may resolve your symptoms with or without further involvement from your physician, physical therapist or athletic trainer. While completing these exercises, remember:  Muscles can gain both the endurance and the strength needed for everyday activities through controlled exercises. Complete these exercises as instructed by your physician, physical therapist or athletic trainer. Increase the resistance and repetitions only as guided. You may experience muscle soreness or fatigue, but the pain or discomfort you are trying to eliminate should never worsen during these exercises. If  this pain does worsen, stop and make certain you are following the directions exactly. If the pain is still present after adjustments, discontinue the exercise until you can discuss the trouble with your caregiver.  STRENGTHENING - Deep Abdominals, Pelvic Tilt  Lie on a firm bed or floor. Keeping your legs in front of you, bend your knees so they are both pointed toward the ceiling and your feet are flat on the floor. Tense your lower abdominal muscles to press your low back into the floor. This motion will rotate your pelvis so that your tail bone is scooping upwards rather than pointing at your feet or into the floor. With a gentle tension and even breathing, hold this position for 3 seconds. Repeat 2 times. Complete this exercise 3 times per week.   STRENGTHENING - Abdominals, Crunches  Lie on a firm bed or floor. Keeping your legs in front of you, bend your knees so they are both pointed toward the  ceiling and your feet are flat on the floor. Cross your arms over your chest. Slightly tip your chin down without bending your neck. Tense your abdominals and slowly lift your trunk high enough to just clear your shoulder blades. Lifting higher can put excessive stress on the lower back and does not further strengthen your abdominal muscles. Control your return to the starting position. Repeat 2 times. Complete this exercise 3 times per week.   STRENGTHENING - Quadruped, Opposite UE/LE Lift  Assume a hands and knees position on a firm surface. Keep your hands under your shoulders and your knees under your hips. You may place padding under your knees for comfort. Find your neutral spine and gently tense your abdominal muscles so that you can maintain this position. Your shoulders and hips should form a rectangle that is parallel with the floor and is not twisted. Keeping your trunk steady, lift your right hand no higher than your shoulder and then your left leg no higher than your hip. Make sure you are not holding your breath. Hold this position for 30 seconds. Continuing to keep your abdominal muscles tense and your back steady, slowly return to your starting position. Repeat with the opposite arm and leg. Repeat 2 times. Complete this exercise 3 times per week.   STRENGTHENING - Abdominals and Quadriceps, Straight Leg Raise  Lie on a firm bed or floor with both legs extended in front of you. Keeping one leg in contact with the floor, bend the other knee so that your foot can rest flat on the floor. Find your neutral spine, and tense your abdominal muscles to maintain your spinal position throughout the exercise. Slowly lift your straight leg off the floor about 6 inches for a count of 3, making sure to not hold your breath. Still keeping your neutral spine, slowly lower your leg all the way to the floor. Repeat this exercise with each leg 2 times. Complete this exercise 3 times per  week.  POSTURE AND BODY MECHANICS CONSIDERATIONS - Low Back Sprain Keeping correct posture when sitting, standing or completing your activities will reduce the stress put on different body tissues, allowing injured tissues a chance to heal and limiting painful experiences. The following are general guidelines for improved posture.  While reading these guidelines, remember: The exercises prescribed by your provider will help you have the flexibility and strength to maintain correct postures. The correct posture provides the best environment for your joints to work. All of your joints have less wear and tear  when properly supported by a spine with good posture. This means you will experience a healthier, less painful body. Correct posture must be practiced with all of your activities, especially prolonged sitting and standing. Correct posture is as important when doing repetitive low-stress activities (typing) as it is when doing a single heavy-load activity (lifting).  RESTING POSITIONS Consider which positions are most painful for you when choosing a resting position. If you have pain with flexion-based activities (sitting, bending, stooping, squatting), choose a position that allows you to rest in a less flexed posture. You would want to avoid curling into a fetal position on your side. If your pain worsens with extension-based activities (prolonged standing, working overhead), avoid resting in an extended position such as sleeping on your stomach. Most people will find more comfort when they rest with their spine in a more neutral position, neither too rounded nor too arched. Lying on a non-sagging bed on your side with a pillow between your knees, or on your back with a pillow under your knees will often provide some relief. Keep in mind, being in any one position for a prolonged period of time, no matter how correct your posture, can still lead to stiffness.  PROPER SITTING POSTURE In order to  minimize stress and discomfort on your spine, you must sit with correct posture. Sitting with good posture should be effortless for a healthy body. Returning to good posture is a gradual process. Many people can work toward this most comfortably by using various supports until they have the flexibility and strength to maintain this posture on their own. When sitting with proper posture, your ears will fall over your shoulders and your shoulders will fall over your hips. You should use the back of the chair to support your upper back. Your lower back will be in a neutral position, just slightly arched. You may place a small pillow or folded towel at the base of your lower back for  support.  When working at a desk, create an environment that supports good, upright posture. Without extra support, muscles tire, which leads to excessive strain on joints and other tissues. Keep these recommendations in mind:  CHAIR: A chair should be able to slide under your desk when your back makes contact with the back of the chair. This allows you to work closely. The chair's height should allow your eyes to be level with the upper part of your monitor and your hands to be slightly lower than your elbows.  BODY POSITION Your feet should make contact with the floor. If this is not possible, use a foot rest. Keep your ears over your shoulders. This will reduce stress on your neck and low back.  INCORRECT SITTING POSTURES  If you are feeling tired and unable to assume a healthy sitting posture, do not slouch or slump. This puts excessive strain on your back tissues, causing more damage and pain. Healthier options include: Using more support, like a lumbar pillow. Switching tasks to something that requires you to be upright or walking. Talking a brief walk. Lying down to rest in a neutral-spine position.  PROLONGED STANDING WHILE SLIGHTLY LEANING FORWARD  When completing a task that requires you to lean forward while  standing in one place for a long time, place either foot up on a stationary 2-4 inch high object to help maintain the best posture. When both feet are on the ground, the lower back tends to lose its slight inward curve. If this curve flattens (  or becomes too large), then the back and your other joints will experience too much stress, tire more quickly, and can cause pain.  CORRECT STANDING POSTURES Proper standing posture should be assumed with all daily activities, even if they only take a few moments, like when brushing your teeth. As in sitting, your ears should fall over your shoulders and your shoulders should fall over your hips. You should keep a slight tension in your abdominal muscles to brace your spine. Your tailbone should point down to the ground, not behind your body, resulting in an over-extended swayback posture.   INCORRECT STANDING POSTURES  Common incorrect standing postures include a forward head, locked knees and/or an excessive swayback. WALKING Walk with an upright posture. Your ears, shoulders and hips should all line-up.  PROLONGED ACTIVITY IN A FLEXED POSITION When completing a task that requires you to bend forward at your waist or lean over a low surface, try to find a way to stabilize 3 out of 4 of your limbs. You can place a hand or elbow on your thigh or rest a knee on the surface you are reaching across. This will provide you more stability, so that your muscles do not tire as quickly. By keeping your knees relaxed, or slightly bent, you will also reduce stress across your lower back. CORRECT LIFTING TECHNIQUES  DO : Assume a wide stance. This will provide you more stability and the opportunity to get as close as possible to the object which you are lifting. Tense your abdominals to brace your spine. Bend at the knees and hips. Keeping your back locked in a neutral-spine position, lift using your leg muscles. Lift with your legs, keeping your back straight. Test the  weight of unknown objects before attempting to lift them. Try to keep your elbows locked down at your sides in order get the best strength from your shoulders when carrying an object.   Always ask for help when lifting heavy or awkward objects. INCORRECT LIFTING TECHNIQUES DO NOT:  Lock your knees when lifting, even if it is a small object. Bend and twist. Pivot at your feet or move your feet when needing to change directions. Assume that you can safely pick up even a paperclip without proper posture.

## 2022-12-21 ENCOUNTER — Telehealth: Payer: Self-pay | Admitting: Family Medicine

## 2022-12-21 ENCOUNTER — Encounter (HOSPITAL_BASED_OUTPATIENT_CLINIC_OR_DEPARTMENT_OTHER): Payer: Medicare HMO | Attending: General Surgery | Admitting: General Surgery

## 2022-12-21 DIAGNOSIS — D128 Benign neoplasm of rectum: Secondary | ICD-10-CM | POA: Diagnosis not present

## 2022-12-21 DIAGNOSIS — E1151 Type 2 diabetes mellitus with diabetic peripheral angiopathy without gangrene: Secondary | ICD-10-CM | POA: Insufficient documentation

## 2022-12-21 DIAGNOSIS — Z7901 Long term (current) use of anticoagulants: Secondary | ICD-10-CM | POA: Diagnosis not present

## 2022-12-21 DIAGNOSIS — I110 Hypertensive heart disease with heart failure: Secondary | ICD-10-CM | POA: Diagnosis not present

## 2022-12-21 DIAGNOSIS — Z7902 Long term (current) use of antithrombotics/antiplatelets: Secondary | ICD-10-CM | POA: Diagnosis not present

## 2022-12-21 DIAGNOSIS — D123 Benign neoplasm of transverse colon: Secondary | ICD-10-CM | POA: Diagnosis not present

## 2022-12-21 DIAGNOSIS — I2510 Atherosclerotic heart disease of native coronary artery without angina pectoris: Secondary | ICD-10-CM | POA: Insufficient documentation

## 2022-12-21 DIAGNOSIS — I10 Essential (primary) hypertension: Secondary | ICD-10-CM | POA: Diagnosis not present

## 2022-12-21 DIAGNOSIS — M545 Low back pain, unspecified: Secondary | ICD-10-CM | POA: Diagnosis not present

## 2022-12-21 DIAGNOSIS — Z8673 Personal history of transient ischemic attack (TIA), and cerebral infarction without residual deficits: Secondary | ICD-10-CM | POA: Diagnosis not present

## 2022-12-21 DIAGNOSIS — E1140 Type 2 diabetes mellitus with diabetic neuropathy, unspecified: Secondary | ICD-10-CM | POA: Insufficient documentation

## 2022-12-21 DIAGNOSIS — D124 Benign neoplasm of descending colon: Secondary | ICD-10-CM | POA: Diagnosis not present

## 2022-12-21 DIAGNOSIS — L97512 Non-pressure chronic ulcer of other part of right foot with fat layer exposed: Secondary | ICD-10-CM | POA: Diagnosis not present

## 2022-12-21 DIAGNOSIS — I5032 Chronic diastolic (congestive) heart failure: Secondary | ICD-10-CM | POA: Insufficient documentation

## 2022-12-21 DIAGNOSIS — E1165 Type 2 diabetes mellitus with hyperglycemia: Secondary | ICD-10-CM | POA: Insufficient documentation

## 2022-12-21 DIAGNOSIS — F482 Pseudobulbar affect: Secondary | ICD-10-CM | POA: Diagnosis not present

## 2022-12-21 DIAGNOSIS — E11621 Type 2 diabetes mellitus with foot ulcer: Secondary | ICD-10-CM | POA: Insufficient documentation

## 2022-12-21 DIAGNOSIS — I480 Paroxysmal atrial fibrillation: Secondary | ICD-10-CM | POA: Diagnosis not present

## 2022-12-21 DIAGNOSIS — E1142 Type 2 diabetes mellitus with diabetic polyneuropathy: Secondary | ICD-10-CM | POA: Diagnosis not present

## 2022-12-21 DIAGNOSIS — D122 Benign neoplasm of ascending colon: Secondary | ICD-10-CM | POA: Diagnosis not present

## 2022-12-21 MED ORDER — TRAMADOL HCL 50 MG PO TABS: 50.0000 mg | ORAL_TABLET | Freq: Three times a day (TID) | ORAL | 0 refills | Status: AC | PRN

## 2022-12-21 NOTE — Telephone Encounter (Signed)
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

## 2022-12-21 NOTE — Addendum Note (Signed)
Addended by: Radene Gunning on: 12/21/2022 12:03 PM   Modules accepted: Orders

## 2022-12-21 NOTE — Telephone Encounter (Signed)
Called HH left a detailed message of verbal request ok per PCP and informed of wound information

## 2022-12-21 NOTE — Telephone Encounter (Signed)
Caller/Agency: [facility] (genevieve)  Callback Number: [phone] (secure) Requesting OT/PT/Skilled Nursing/Social Work/Speech Therapy: PT Frequency: 2x for 4 weeks  1x for 2 weeks.   Physical therapist would also like to know what type of wound pt has on his right foot and if he is a diabetic. Stated he has been seeing the wound clinic for this since January.

## 2022-12-21 NOTE — Telephone Encounter (Signed)
Pt said that the muscle relaxers are helping but he needs something to help take the edge off. Please send it to [facility] on [address] if he calls in something for pain. Please advise pt

## 2022-12-28 ENCOUNTER — Encounter (HOSPITAL_BASED_OUTPATIENT_CLINIC_OR_DEPARTMENT_OTHER): Payer: Medicare HMO | Admitting: General Surgery

## 2022-12-28 DIAGNOSIS — E1140 Type 2 diabetes mellitus with diabetic neuropathy, unspecified: Secondary | ICD-10-CM | POA: Diagnosis not present

## 2022-12-28 DIAGNOSIS — I5032 Chronic diastolic (congestive) heart failure: Secondary | ICD-10-CM | POA: Diagnosis not present

## 2022-12-28 DIAGNOSIS — Z7902 Long term (current) use of antithrombotics/antiplatelets: Secondary | ICD-10-CM | POA: Diagnosis not present

## 2022-12-28 DIAGNOSIS — I110 Hypertensive heart disease with heart failure: Secondary | ICD-10-CM | POA: Diagnosis not present

## 2022-12-28 DIAGNOSIS — E1151 Type 2 diabetes mellitus with diabetic peripheral angiopathy without gangrene: Secondary | ICD-10-CM | POA: Diagnosis not present

## 2022-12-28 DIAGNOSIS — Z7901 Long term (current) use of anticoagulants: Secondary | ICD-10-CM | POA: Diagnosis not present

## 2022-12-28 DIAGNOSIS — E11621 Type 2 diabetes mellitus with foot ulcer: Secondary | ICD-10-CM | POA: Diagnosis not present

## 2022-12-28 DIAGNOSIS — I2510 Atherosclerotic heart disease of native coronary artery without angina pectoris: Secondary | ICD-10-CM | POA: Diagnosis not present

## 2022-12-28 DIAGNOSIS — E1165 Type 2 diabetes mellitus with hyperglycemia: Secondary | ICD-10-CM | POA: Diagnosis not present

## 2022-12-28 DIAGNOSIS — L97512 Non-pressure chronic ulcer of other part of right foot with fat layer exposed: Secondary | ICD-10-CM | POA: Diagnosis not present

## 2022-12-29 ENCOUNTER — Telehealth: Payer: Self-pay

## 2022-12-29 ENCOUNTER — Other Ambulatory Visit: Payer: Self-pay | Admitting: Gastroenterology

## 2022-12-29 NOTE — Progress Notes (Signed)
[name], LOUKS [name] ([id number]) [id number] Page 1 of 9 Visit Report for 11/19/2022 Chief Complaint Document Details Patient Name: Date of Service: [name], [name] [name]. 11/19/2022 9:45 [name] Medical Record Number: [id number] Patient Account Number: [id number] Date of Birth/Sex: Treating RN: 01/25/1956 (67 y.o. M) Primary Care Provider: Arva Chafe Other Clinician: Referring Provider: Treating Provider/Extender: [name] in Treatment: 11 Information Obtained from: Patient Chief Complaint 05/14/2020; patient is here for review of an abrasion injury on the left lateral calf 08/31/2022: [name] (1st metatarsal base, plantar) Electronic Signature(s) Signed: 11/19/2022 10:30:01 AM By: Duanne Guess MD FACS Entered By: Duanne Guess on 11/19/2022 10:30:01 -------------------------------------------------------------------------------- HPI Details Patient Name: Date of Service: [name] Pian, [name]. 11/19/2022 9:45 [name] Medical Record Number: [id number] Patient Account Number: [id number] Date of Birth/Sex: Treating RN: 25-Jul-1955 (67 y.o. M) Primary Care Provider: Arva Chafe Other Clinician: Referring Provider: Treating Provider/Extender: [name] in Treatment: 11 History of Present Illness HPI Description: ADMISSION 05/14/2020; this is [name] 66 year old man with multiple medical issues. Predominantly he has type 2 diabetes with [name] and also history of fairly significant PAD. He had [name] in February 2017 he also had an atherectomy and angioplasty by Dr. [name] of the right popliteal artery in 2016. He is supposed to be getting arterial studies annually however this was interrupted last year because of the pandemic. He tells [place] he was at [facility] 2 weeks ago was getting out of of the scooter and traumatized his left lateral  lower leg. There was [name] as the patient is on Plavix and Eliquis. They have been dressing this with Neosporin and doing [name] fairly good job. Wound measures 2.5 x 3.5 it does not have any depth he does not have [name] in his legs outside of surgery however he does have chronic edema and skin changes suggestive of chronic venous disease possibly some degree of lymphedema as well. Past medical history includes type 2 diabetes with peripheral neuropathy and gait instability, lumbar spondylosis, obstructive sleep apnea, history of [name], basal cell skin cancer, atrial fibrillation on anticoagulation, significant PAD as noted with [name] and [name] in 30th 2016. He also has [name] of coronary artery disease with an MI in 2002 hypertension hyperlipidemia and heart failure with preserved ejection fraction His last arterial studies I can see in epic were on 03/10/2018 this showed [name] and [name] with monophasic waveforms on the right. On the left his ABI was 1.20 with [name] and triphasic waveforms. He has not had arterial studies since. Our nurse in the clinic got an ABI on the left of 1.1 11/2; left anterior leg wound in the setting of chronic venous insufficiency. Wound was initially trauma. We have been using Hydrofera Blue under compression he has home health.. The wound looks [name] lot better today with improvement in surface area 11/16; left anterior leg wound in the setting of chronic venous insufficiency. Wound was initially trauma. We have been using Hydrofera Blue under compression. The patient is closed today. He is supposed to follow-up with vein and vascular with regards to arterial insufficiency nevertheless his leg wounds are 12/3; apparently 2 weeks ago when they were putting on their stockings they managed  to get  3 wounds on the left anterior lower leg from abrasion when putting on the stockings. Home health came by the week of Thanksgiving and put Hydrofera Blue 4-layer wrap on this and there is only one superficial area remaining. The patient and his wife complained about the difficulties getting stockings on I think we are using 20/30. We will order bilateral external compression stockings which should be easier. 12/10; wound on the left anterior lower leg is closed. He has chronic venous insufficiency we ordered him Farrow wrap stockings unfortunately he did not bring these in. 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 He says that it has been present since mid December. He is currently residing in [facility] until March and [name] podiatrist has been looking after him there. They have been simply painting the area with Betadine. He has not had any lower extremity arterial studies since 2019, at which time his right ABI was 0.69. Measured in clinic today, it was 0.71. He is not aware of his most recent hemoglobin A1c, but historically he has had exceptionally poor control. On the basis of his right first metatarsal, there is [name] small crescent shaped wound. There is surrounding eschar and callus. There is no malodor or purulent Breach, Greysen [name] ([id number]) [id number].pdf Page 2 of 9 drainage. 09/08/2022: The original wound is smaller today and fairly clean, but there is some discoloration and [name] pulpy texture to the adjacent callus. Underneath this, the tissue is open exposing the fat layer. It looks like perhaps there was [name] crack in the callus and moisture got under the skin and caused breakdown. 09/15/2022: There has been more moisture related tissue breakdown. The callus is very soft and the underlying tissue is more open. 09/28/2022: The wound looks much better. He has done [name] good job keeping it dry. There is some callus  overlying much of the wound surface. There is slough on the exposed open areas. 10/14/2022: There has been [name] lot of moisture related tissue breakdown. He is still forming callus over the top and then it seems that moisture gets underneath the callus and causes tissue damage. There is slough on the exposed wound surface. They will be moving back to the [place] area [place] the [place] this weekend. 10/21/2022: His foot is less wet, but there is no significant change to his wound. He has developed [name] blister on his left anterior tibial surface. There is no open wound here, but he does have some fairly significant edema. We are planning to apply [name] total contact cast today. 10/23/2022: Here for his obligatory first cast change. He says he has not had any issues wearing the cast or walking in the boot. No detrimental effects on his wound. 10/29/2022: The wound is looking better. Clearly, putting him in the cast has prevented water from getting in under his callus and causing further tissue breakdown. The blister on his left anterior tibial surface has not yet ruptured. Edema control is significantly improved. 11/05/2022: The wound on his right plantar foot is getting better. There is still some callus accumulation around the wounds, but there are just 2 open areas now, [name] very small 1 on the medial aspect of his metatarsal head and [name] little bit larger 1 on the plantar surface. The blister on his left anterior tibial surface is now open with hanging dry skin. 11/12/2022: The left anterior tibial wound is closed. The wound on his right  plantar foot is smaller with just [name] little bit of callus around the edges. 11/19/2022: The right plantar foot wound continues to contract. The surface is quite clean. The tiny satellite area on the medial aspect of his foot has closed. Electronic Signature(s) Signed: 11/19/2022 10:30:32 AM By: Duanne Guess MD FACS Entered By: Duanne Guess on 11/19/2022  10:30:32 -------------------------------------------------------------------------------- Physical Exam Details Patient Name: Date of Service: [name] Pian, [name]. 11/19/2022 9:45 [name] Medical Record Number: [id number] Patient Account Number: [id number] Date of Birth/Sex: Treating RN: 28-Sep-1955 (67 y.o. M) Primary Care Provider: Arva Chafe Other Clinician: Referring Provider: Treating Provider/Extender: [name] in Treatment: 11 Constitutional Hypertensive, asymptomatic. Slightly bradycardic. . . no acute distress. Respiratory Normal work of breathing on room air. Notes 11/19/2022: The right plantar foot wound continues to contract. The surface is quite clean. The tiny satellite area on the medial aspect of his foot has closed. Electronic Signature(s) Signed: 11/19/2022 10:31:24 AM By: Duanne Guess MD FACS Entered By: Duanne Guess on 11/19/2022 10:31:24 -------------------------------------------------------------------------------- Physician Orders Details Patient Name: Date of Service: [name] Pian, [name]. 11/19/2022 9:45 [name] Medical Record Number: [id number] Patient Account Number: [id number] Date of Birth/Sex: Treating RN: 02/27/1956 (67 y.o. Yates Decamp Primary Care Provider: Arva Chafe Other Clinician: Referring Provider: Treating Provider/Extender: [name] in Treatment: 11 Verbal / Phone Orders: No Diagnosis Coding ICD-10 Coding Code Description L97.512 Non-pressure chronic ulcer of other part of right foot with fat layer exposed Laguna, Brazen [name] ([id number]) [phone].pdf Page 3 of 9 I73.9 Peripheral vascular disease, unspecified I25.10 Atherosclerotic heart disease of native coronary artery without angina pectoris I10 Essential (primary) hypertension E11.65 Type 2 diabetes mellitus with hyperglycemia E11.40 Type 2 diabetes mellitus with diabetic neuropathy,  unspecified I63.9 Cerebral infarction, unspecified Follow-up Appointments ppointment in 1 week. - Dr. Lady Gary - room 2 Return [name] Anesthetic (In clinic) Topical Lidocaine 4% applied to wound bed Bathing/ Shower/ Hygiene May shower with protection but do not get wound dressing(s) wet. Protect dressing(s) with water repellant cover (for example, large plastic bag) or [name] cast cover and may then take shower. Edema Control - Lymphedema / SCD / Other Elevate legs to the level of the heart or above for 30 minutes daily and/or when sitting for 3-4 times [name] day throughout the day. Avoid standing for long periods of time. Patient to wear own compression stockings every day. Moisturize legs daily. Off-Loading Total Contact Cast to Right Lower Extremity Removable cast walker boot to: - right leg Non Wound Condition Left Lower Extremity Other Non Wound Condition Orders/Instructions: - 3 layer wrap with silver alginate over blister Wound Treatment Wound #2 - Foot Wound Laterality: Plantar, Right Cleanser: Soap and Water 1 x Per Week/30 Days Discharge Instructions: May shower and wash wound with dial antibacterial soap and water prior to dressing change. Cleanser: Wound Cleanser 1 x Per Week/30 Days Discharge Instructions: Cleanse the wound with wound cleanser prior to applying [name] clean dressing using gauze sponges, not tissue or cotton balls. Peri-Wound Care: Zinc Oxide Ointment 30g tube 1 x Per Week/30 Days Discharge Instructions: Apply Zinc Oxide to periwound with each dressing change Prim Dressing: Hydrofera Blue Ready Transfer Foam, 2.5x2.5 (in/in) 1 x Per Week/30 Days ary Discharge Instructions: Apply directly to wound bed as directed Secondary Dressing: Woven Gauze Sponge, Non-Sterile 4x4 in 1 x Per Week/30 Days Discharge Instructions: Apply over primary dressing as directed. Secondary Dressing: Zetuvit Plus 4x8 in 1 x Per Week/30 Days Discharge Instructions: Apply  over primary dressing as  directed. Secured With: [employer], 4.5x3.1 (in/yd) 1 x Per Week/30 Days Discharge Instructions: Secure with Kerlix as directed. Electronic Signature(s) Signed: 11/19/2022 12:51:59 PM By: Duanne Guess MD FACS Entered By: Duanne Guess on 11/19/2022 10:32:35 -------------------------------------------------------------------------------- Problem List Details Patient Name: Date of Service: [name] Pian, [name]. 11/19/2022 9:45 [name] Medical Record Number: [id number] Patient Account Number: [id number] Date of Birth/Sex: Treating RN: January 21, 1956 (67 y.o. M) Primary Care Provider: Arva Chafe Other Clinician: Referring Provider: Treating Provider/Extender: [name] in Treatment: 11 Active Problems ICD-10 Desaulniers, Lando [name] ([id number]) [id number] Page 4 of 9 Encounter Code Description Active Date MDM Diagnosis L97.512 Non-pressure chronic ulcer of other part of right foot with fat layer exposed 08/31/2022 No Yes I73.9 Peripheral vascular disease, unspecified 08/31/2022 No Yes I25.10 Atherosclerotic heart disease of native coronary artery without angina pectoris 08/31/2022 No Yes I10 Essential (primary) hypertension 08/31/2022 No Yes E11.65 Type 2 diabetes mellitus with hyperglycemia 08/31/2022 No Yes E11.40 Type 2 diabetes mellitus with diabetic neuropathy, unspecified 08/31/2022 No Yes I63.9 Cerebral infarction, unspecified 08/31/2022 No Yes Inactive Problems Resolved Problems ICD-10 Code Description Active Date Resolved Date L97.821 Non-pressure chronic ulcer of other part of left lower leg limited to breakdown of skin 11/05/2022 11/05/2022 Electronic Signature(s) Signed: 11/19/2022 10:29:14 AM By: Duanne Guess MD FACS Entered By: Duanne Guess on 11/19/2022 10:29:14 -------------------------------------------------------------------------------- Progress Note Details Patient Name: Date of Service: [name] Pian, [name].  11/19/2022 9:45 [name] Medical Record Number: [id number] Patient Account Number: [id number] Date of Birth/Sex: Treating RN: 1956/03/07 (67 y.o. M) Primary Care Provider: Arva Chafe Other Clinician: Referring Provider: Treating Provider/Extender: [name] in Treatment: 11 Subjective Chief Complaint Information obtained from Patient 05/14/2020; patient is here for review of an abrasion injury on the left lateral calf 08/31/2022: [name] (1st metatarsal base, plantar) History of Present Illness (HPI) ADMISSION 05/14/2020; this is [name] with multiple medical issues. Predominantly he has type 2 diabetes with [name] and also history of fairly significant PAD. He had [name] in February 2017 he also had an atherectomy and angioplasty by Dr. [name] of the right popliteal artery in 2016. He is supposed to be getting arterial studies annually however this was interrupted last year because of the pandemic. He tells [place] he was at [facility] 2 weeks ago was getting out of of the scooter and traumatized his left lateral lower leg. There was [name] as the patient is on Plavix and Eliquis. They have been dressing this with Neosporin and doing [name] fairly good job. Wound measures 2.5 x 3.5 it does not have any depth he does not have [name] in his legs outside of surgery however he does have chronic edema and skin changes suggestive of chronic venous disease possibly some degree of lymphedema as well. Past medical history includes type 2 diabetes with peripheral neuropathy and gait instability, lumbar spondylosis, obstructive sleep apnea, history of [name] left Giacomo, Aleczander [name] ([id number]) [phone].pdf Page 5 of 9 pontine CVA, basal cell skin cancer, atrial fibrillation on anticoagulation, significant PAD as noted with [name] left superficial femoral to  posterior tibial bypass in February 2017 and [name] in 30th 2016. He also has [name] of coronary artery disease with an MI in 2002 hypertension hyperlipidemia and heart failure with preserved ejection fraction His last arterial studies  I can see in epic were on 03/10/2018 this showed [name] and [name] with monophasic waveforms on the right. On the left his ABI was 1.20 with [name] and triphasic waveforms. He has not had arterial studies since. Our nurse in the clinic got an ABI on the left of 1.1 11/2; left anterior leg wound in the setting of chronic venous insufficiency. Wound was initially trauma. We have been using Hydrofera Blue under compression he has home health.. The wound looks [name] lot better today with improvement in surface area 11/16; left anterior leg wound in the setting of chronic venous insufficiency. Wound was initially trauma. We have been using Hydrofera Blue under compression. The patient is closed today. He is supposed to follow-up with vein and vascular with regards to arterial insufficiency nevertheless his leg wounds are 12/3; apparently 2 weeks ago when they were putting on their stockings they managed to get 3 wounds on the left anterior lower leg from abrasion when putting on the stockings. Home health came by the week of Thanksgiving and put Hydrofera Blue 4-layer wrap on this and there is only one superficial area remaining. The patient and his wife complained about the difficulties getting stockings on I think we are using 20/30. We will order bilateral external compression stockings which should be easier. 12/10; wound on the left anterior lower leg is closed. He has chronic venous insufficiency we ordered him Farrow wrap stockings unfortunately he did not bring these in. 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 He  says that it has been present since mid December. He is currently residing in [facility] until March and [name] podiatrist has been looking after him there. They have been simply painting the area with Betadine. He has not had any lower extremity arterial studies since 2019, at which time his right ABI was 0.69. Measured in clinic today, it was 0.71. He is not aware of his most recent hemoglobin A1c, but historically he has had exceptionally poor control. On the basis of his right first metatarsal, there is [name] small crescent shaped wound. There is surrounding eschar and callus. There is no malodor or purulent drainage. 09/08/2022: The original wound is smaller today and fairly clean, but there is some discoloration and [name] pulpy texture to the adjacent callus. Underneath this, the tissue is open exposing the fat layer. It looks like perhaps there was [name] crack in the callus and moisture got under the skin and caused breakdown. 09/15/2022: There has been more moisture related tissue breakdown. The callus is very soft and the underlying tissue is more open. 09/28/2022: The wound looks much better. He has done [name] good job keeping it dry. There is some callus overlying much of the wound surface. There is slough on the exposed open areas. 10/14/2022: There has been [name] lot of moisture related tissue breakdown. He is still forming callus over the top and then it seems that moisture gets underneath the callus and causes tissue damage. There is slough on the exposed wound surface. They will be moving back to the [place] area [place] the [place] this [place]. 10/21/2022: His foot is less wet, but there is no significant change to his wound. He has developed [name] blister on his left anterior tibial surface. There is no open wound here, but he does have some fairly significant edema. We are planning to apply [name] total  contact cast today. 10/23/2022: Here for his obligatory first cast change. He says he has not had any issues wearing the cast  or walking in the boot. No detrimental effects on his wound. 10/29/2022: The wound is looking better. Clearly, putting him in the cast has prevented water from getting in under his callus and causing further tissue breakdown. The blister on his left anterior tibial surface has not yet ruptured. Edema control is significantly improved. 11/05/2022: The wound on his right plantar foot is getting better. There is still some callus accumulation around the wounds, but there are just 2 open areas now, [name] very small 1 on the medial aspect of his metatarsal head and [name] little bit larger 1 on the plantar surface. The blister on his left anterior tibial surface is now open with hanging dry skin. 11/12/2022: The left anterior tibial wound is closed. The wound on his right plantar foot is smaller with just [name] little bit of callus around the edges. 11/19/2022: The right plantar foot wound continues to contract. The surface is quite clean. The tiny satellite area on the medial aspect of his foot has closed. Patient History Information obtained from Patient. Family History Unknown History. Social History Never smoker, Marital Status - Single, Alcohol Use - Never, Drug Use - No History, Caffeine Use - Rarely. Medical History Respiratory Patient has history of Sleep Apnea Cardiovascular Patient has history of Arrhythmia - [name]-Fib, Coronary Artery Disease - s/p CABG, Deep Vein Thrombosis, Hypertension, Myocardial Infarction, Peripheral Arterial Disease - s/p Fempop x 2 Endocrine Patient has history of Type II Diabetes Neurologic Patient has history of Neuropathy Hospitalization/Surgery History - colonoscopy. - polypectomy. - peripheral vascular cath. - shoulder arthroscopy. - carpal tunnel release. - coronary artery bypass. - appendectomy. - cardiac cath. - coronary angioplasty. - thrombectomy. - knee arthroplasty. - popliteal artery stent. Medical [name] Surgical History Notes nd Cardiovascular CVA x  3 Musculoskeletal carpal tunnel syndrome Neurologic stroke Oncologic skin cancer Broyles, Marvell [name] ([id number]) [phone].pdf Page 6 of 9 Objective Constitutional Hypertensive, asymptomatic. Slightly bradycardic. no acute distress. Vitals Time Taken: 9:44 AM, Height: 70 in, Weight: 260 lbs, BMI: 37.3, Temperature: 98.7 F, Pulse: 56 bpm, Respiratory Rate: 20 breaths/min, Blood Pressure: 161/53 mmHg. Respiratory Normal work of breathing on room air. General Notes: 11/19/2022: The right plantar foot wound continues to contract. The surface is quite clean. The tiny satellite area on the medial aspect of his foot has closed. Integumentary (Hair, Skin) Wound #2 status is Open. Original cause of wound was Gradually Appeared. The date acquired was: 07/08/2022. The wound has been in treatment 11 weeks. The wound is located on the Right,Plantar Foot. The wound measures 1.1cm length x 1.5cm width x 0.1cm depth; 1.296cm^2 area and 0.13cm^3 volume. There is Fat Layer (Subcutaneous Tissue) exposed. There is [name] medium amount of serosanguineous drainage noted. The wound margin is distinct with the outline attached to the wound base. There is medium (34-66%) red granulation within the wound bed. There is [name] medium (34-66%) amount of necrotic tissue within the wound bed including Adherent Slough. The periwound skin appearance had no abnormalities noted for moisture. The periwound skin appearance had no abnormalities noted for color. The periwound skin appearance exhibited: Callus. Periwound temperature was noted as No Abnormality. Assessment Active Problems ICD-10 Non-pressure chronic ulcer of other part of right foot with fat layer exposed Peripheral vascular disease, unspecified Atherosclerotic heart disease of native coronary artery without angina pectoris Essential (primary) hypertension Type 2 diabetes mellitus with hyperglycemia Type 2  diabetes mellitus with diabetic neuropathy,  unspecified Cerebral infarction, unspecified Procedures Wound #2 Pre-procedure diagnosis of Wound #2 is [name] Diabetic Wound/Ulcer of the Lower Extremity located on the Right,Plantar Foot . There was [name] T Contact Cast otal Procedure by Duanne Guess, MD. Post procedure Diagnosis Wound #2: Same as Pre-Procedure Notes: Scribed for Dr. Lady Gary by Brenton Grills, RN. Plan Follow-up Appointments: Return Appointment in 1 week. - Dr. Lady Gary - room 2 Anesthetic: (In clinic) Topical Lidocaine 4% applied to wound bed Bathing/ Shower/ Hygiene: May shower with protection but do not get wound dressing(s) wet. Protect dressing(s) with water repellant cover (for example, large plastic bag) or [name] cast cover and may then take shower. Edema Control - Lymphedema / SCD / Other: Elevate legs to the level of the heart or above for 30 minutes daily and/or when sitting for 3-4 times [name] day throughout the day. Avoid standing for long periods of time. Patient to wear own compression stockings every day. Moisturize legs daily. Off-Loading: T Contact Cast to Right Lower Extremity otal Removable cast walker boot to: - right leg Non Wound Condition: Other Non Wound Condition Orders/Instructions: - 3 layer wrap with silver alginate over blister WOUND #2: - Foot Wound Laterality: Plantar, Right Weinrich, Treson [name] ([id number]) [id number] Page 7 of 9 Cleanser: Soap and Water 1 x Per Week/30 Days Discharge Instructions: May shower and wash wound with dial antibacterial soap and water prior to dressing change. Cleanser: Wound Cleanser 1 x Per Week/30 Days Discharge Instructions: Cleanse the wound with wound cleanser prior to applying [name] clean dressing using gauze sponges, not tissue or cotton balls. Peri-Wound Care: Zinc Oxide Ointment 30g tube 1 x Per Week/30 Days Discharge Instructions: Apply Zinc Oxide to periwound with each dressing change Prim Dressing: Hydrofera Blue Ready Transfer Foam, 2.5x2.5  (in/in) 1 x Per Week/30 Days ary Discharge Instructions: Apply directly to wound bed as directed Secondary Dressing: Woven Gauze Sponge, Non-Sterile 4x4 in 1 x Per Week/30 Days Discharge Instructions: Apply over primary dressing as directed. Secondary Dressing: Zetuvit Plus 4x8 in 1 x Per Week/30 Days Discharge Instructions: Apply over primary dressing as directed. Secured With: [employer], 4.5x3.1 (in/yd) 1 x Per Week/30 Days Discharge Instructions: Secure with Kerlix as directed. 11/19/2022: The right plantar foot wound continues to contract. The surface is quite clean. The tiny satellite area on the medial aspect of his foot has closed. The wound bed was prepared for total contact casting with Hydrofera Blue. The total contact cast was then applied in standard fashion. He will follow-up in 1 week. Electronic Signature(s) Signed: 11/19/2022 10:33:03 AM By: Duanne Guess MD FACS Entered By: Duanne Guess on 11/19/2022 10:33:03 -------------------------------------------------------------------------------- HxROS Details Patient Name: Date of Service: [name] Pian, [name]. 11/19/2022 9:45 [name] Medical Record Number: [id number] Patient Account Number: [id number] Date of Birth/Sex: Treating RN: [birth year] (67 y.o. M) Primary Care Provider: Arva Chafe Other Clinician: Referring Provider: Treating Provider/Extender: [name] in Treatment: 11 Information Obtained From Patient Respiratory Medical History: Positive for: Sleep Apnea Cardiovascular Medical History: Positive for: Arrhythmia - [name]-Fib; Coronary Artery Disease - s/p CABG; Deep Vein Thrombosis; Hypertension; Myocardial Infarction; Peripheral Arterial Disease - s/p Fempop x 2 Past Medical History Notes: CVA x 3 Endocrine Medical History: Positive for: Type II Diabetes Time with diabetes: since 1997 Treated with: Insulin, Oral agents Blood sugar tested every day: Yes Tested : 2-3x  per day Musculoskeletal Medical History: Past Medical History Notes: carpal tunnel syndrome Neurologic Medical History: Positive for:  Neuropathy Past Medical History Notes: stroke Oncologic XAEDEN, LAMARTINA [name] ([id number]) [id number] Page 8 of 9 Medical History: Past Medical History Notes: skin cancer Immunizations Pneumococcal Vaccine: Received Pneumococcal Vaccination: Yes Received Pneumococcal Vaccination On or After 60th Birthday: Yes Implantable Devices None Hospitalization / Surgery History Type of Hospitalization/Surgery colonoscopy polypectomy peripheral vascular cath shoulder arthroscopy carpal tunnel release coronary artery bypass appendectomy cardiac cath coronary angioplasty thrombectomy knee arthroplasty popliteal artery stent Family and Social History Unknown History: Yes; Never smoker; Marital Status - Single; Alcohol Use: Never; Drug Use: No History; Caffeine Use: Rarely; Financial Concerns: No; Food, Clothing or Shelter Needs: No; Support System Lacking: No; Transportation Concerns: No Psychologist, prison and probation services) Signed: 11/19/2022 12:51:59 PM By: Duanne Guess MD FACS Entered By: Duanne Guess on 11/19/2022 10:30:59 -------------------------------------------------------------------------------- Total Contact Cast Details Patient Name: Date of Service: [name] [name]. 11/19/2022 9:45 [name] Medical Record Number: [id number] Patient Account Number: [id number] Date of Birth/Sex: Treating RN: [birth year] (67 y.o. Yates Decamp Primary Care Provider: Arva Chafe Other Clinician: Referring Provider: Treating Provider/Extender: [name] in Treatment: 11 T Contact Cast Applied for Wound Assessment: otal Wound #2 Right,Plantar Foot Performed By: Physician Duanne Guess, MD Post Procedure Diagnosis Same as Pre-procedure Notes Scribed for Dr. Lady Gary by Brenton Grills, RN Electronic  Signature(s) Signed: 11/19/2022 12:51:59 PM By: Duanne Guess MD FACS Signed: 12/29/2022 7:49:05 AM By: Brenton Grills Entered By: Brenton Grills on 11/19/2022 10:20:39 -------------------------------------------------------------------------------- SuperBill Details Patient Name: Date of Service: [name] Pian, [name]. 11/19/2022 Medical Record Number: [id number] Patient Account Number: [id number] Date of Birth/Sex: Treating RN: 01-24-1956 (67 y.o. Yates Decamp Primary Care Provider: Arva Chafe Other Clinician: Referring Provider: Treating Provider/Extender: [name] in Treatment: [address] [place] [name] ([id number]) [id number] Page 9 of 9 Diagnosis Coding ICD-10 Codes Code Description L97.512 Non-pressure chronic ulcer of other part of right foot with fat layer exposed I73.9 Peripheral vascular disease, unspecified I25.10 Atherosclerotic heart disease of native coronary artery without angina pectoris I10 Essential (primary) hypertension E11.65 Type 2 diabetes mellitus with hyperglycemia E11.40 Type 2 diabetes mellitus with diabetic neuropathy, unspecified I63.9 Cerebral infarction, unspecified Facility Procedures : CPT4 Code: 62952841 Description: 32440 - APPLY TOTAL CONTACT LEG CAST ICD-10 Diagnosis Description L97.512 Non-pressure chronic ulcer of other part of right foot with fat layer exposed Modifier: Quantity: 1 Physician Procedures : CPT4 Code Description Modifier [id number] - WC PHYS LEVEL 3 - EST PT ICD-10 Diagnosis Description L97.512 Non-pressure chronic ulcer of other part of right foot with fat layer exposed I73.9 Peripheral vascular disease, unspecified E11.65 Type 2  diabetes mellitus with hyperglycemia E11.40 Type 2 diabetes mellitus with diabetic neuropathy, unspecified Quantity: 1 : [id number] 74259 - WC PHYS APPLY TOTAL CONTACT CAST ICD-10 Diagnosis Description L97.512 Non-pressure chronic ulcer of other  part of right foot with fat layer exposed Quantity: 1 Electronic Signature(s) Signed: 11/19/2022 10:33:39 AM By: Duanne Guess MD FACS Entered By: Duanne Guess on 11/19/2022 10:33:39

## 2022-12-29 NOTE — Progress Notes (Signed)
[name], [name] ([id number]) [id number] Page 1 of 6 Visit Report for 11/19/2022 Arrival Information Details Patient Name: Date of Service: [name], [name]. 11/19/2022 9:45 [name] Medical Record Number: [id number] Patient Account Number: [id number] Date of Birth/Sex: Treating RN: 02-Oct-1955 (67 y.o. M) Primary Care [name]: [name] Other Clinician: Referring [name]: Treating [name]/Extender: [name] in Treatment: 11 Visit Information History Since Last Visit All ordered tests and consults were completed: No Patient Arrived: Ambulatory Added or deleted any medications: No Arrival Time: 09:43 Any new allergies or adverse reactions: No Accompanied By: spouse Had [name] fall or experienced change in No Transfer Assistance: None activities of daily living that may affect Patient Identification Verified: Yes risk of falls: Secondary Verification Process Completed: Yes Signs or symptoms of abuse/neglect since last visito No Patient Requires Transmission-Based Precautions: No Hospitalized since last visit: No Patient Has Alerts: No Implantable device outside of the clinic excluding No cellular tissue based products placed in the center since last visit: Pain Present Now: No Electronic Signature(s) Signed: 11/19/2022 2:59:12 PM By: [name] Entered By: [name] on 11/19/2022 09:44:02 -------------------------------------------------------------------------------- Encounter Discharge Information Details Patient Name: Date of Service: [name]. 11/19/2022 9:45 [name] Medical Record Number: [id number] Patient Account Number: [id number] Date of Birth/Sex: Treating RN: [birth year] (67 y.o. [name] Primary Care [name]: [name] Other Clinician: Referring Dereonna Lensing: Treating [name]/Extender: [name] in Treatment: 11 Encounter Discharge Information Items Discharge  Condition: Stable Ambulatory Status: Ambulatory Discharge Destination: Home Transportation: Private Auto Accompanied By: self Schedule Follow-up Appointment: Yes Clinical Summary of Care: Patient Declined Electronic Signature(s) Signed: 12/29/2022 7:49:05 AM By: [name] Entered By: [name] on 11/19/2022 10:49:15 -------------------------------------------------------------------------------- Lower Extremity Assessment Details Patient Name: Date of Service: [name], [name]. 11/19/2022 9:45 [name] Medical Record Number: [id number] Patient Account Number: [id number] Date of Birth/Sex: Treating RN: [birth year] (67 y.o. [name] Primary Care [name]: [name] Other Clinician: Referring Kalii Chesmore: Treating [name]/Extender: [name] in Treatment: 11 Edema Assessment Assessed: [name]: No] [Right: No] P[Left: EGG, [name] ([id number])] [Right: [id number] Page 2 of 6] [Left: Edema] [Right: :] Calf Left: Right: Point of Measurement: From Medial Instep 38.8 cm Ankle Left: Right: Point of Measurement: From Medial Instep 27.2 cm Vascular Assessment Pulses: Dorsalis Pedis Palpable: [Right:Yes] Electronic Signature(s) Signed: 12/29/2022 7:49:05 AM By: [name] Entered By: [name] on 11/19/2022 10:10:41 -------------------------------------------------------------------------------- Multi Wound Chart Details Patient Name: Date of Service: [name], [name]. 11/19/2022 9:45 [name] Medical Record Number: [id number] Patient Account Number: [id number] Date of Birth/Sex: Treating RN: 1956/07/03 (67 y.o. M) Primary Care [name]: [name] Other Clinician: Referring [name]: Treating [name]/Extender: [name] in Treatment: 11 Vital Signs Height(in): 70 Pulse(bpm): 56 Weight(lbs): 260 Blood Pressure(mmHg): 161/53 Body Mass Index(BMI): 37.3 Temperature(F):  98.7 Respiratory Rate(breaths/min): 20 [2:Photos:] [[name]:[name]] Right, Plantar Foot [name] [name] Wound Location: Gradually Appeared [name] [name] Wounding Event: Diabetic Wound/Ulcer of the Lower [name] [name] Primary Etiology: Extremity Sleep Apnea, Arrhythmia, Coronary [name] [name] Comorbid History: Artery Disease, Deep Vein Thrombosis, Hypertension, Myocardial Infarction, Peripheral Arterial Disease, Type II Diabetes, Neuropathy 07/08/2022 [name] [name] Date Acquired: 11 [name] [name] Weeks of Treatment: Open [name] [name] Wound Status: No [name] [name] Wound Recurrence: 1.1x1.5x0.1 [name] [name] Measurements L x W x D (cm) 1.296 [name] [name] [name] : rea 0.13 [name] [name] Volume (cm) : 49.20% [name] [name] % Reduction in [name] rea: 49.00% [name] [name] % Reduction in Volume:  Grade 1 [name] [name] Classification: Medium [name] [name] Exudate [name] mount: Serosanguineous [name] [name] Exudate Type: red, brown [name] [name] Exudate Color: Distinct, outline attached [name] [name] Wound Margin: Medium (34-66%) [name] [name] Granulation [name] mount: Red [name] [name] Granulation Quality: [name], Nat [name] ([id number]) [id number] Page 3 of 6 Medium (34-66%) [name] [name] Necrotic Amount: Fat Layer (Subcutaneous Tissue): Yes [name] [name] Exposed Structures: Fascia: No Tendon: No Muscle: No Joint: No Bone: No Medium (34-66%) [name] [name] Epithelialization: Callus: Yes [name] [name] Periwound Skin Texture: No Abnormalities Noted [name] [name] Periwound Skin Moisture: No Abnormalities Noted [name] [name] Periwound Skin Color: No Abnormality [name] [name] Temperature: T Contact Cast otal [name] [name] Procedures Performed: Treatment Notes Electronic Signature(s) Signed: 11/19/2022 10:29:40 AM By: Duanne Guess MD FACS Entered By: Duanne Guess on 11/19/2022 10:29:40 -------------------------------------------------------------------------------- Multi-Disciplinary Care Plan Details Patient Name: Date of Service: [name], [name]. 11/19/2022 9:45 [name] Medical Record Number: [id number] Patient Account  Number: [id number] Date of Birth/Sex: Treating RN: [birth year] (67 y.o. [name] Primary Care Tariyah Pendry: [name] Other Clinician: Referring Fuller Makin: Treating Tishawn Friedhoff/Extender: [name] in Treatment: 11 Active Inactive Abuse / Safety / Falls / Self Care Management Nursing Diagnoses: Impaired physical mobility Potential for falls Goals: Patient will not experience any injury related to falls Date Initiated: 08/31/2022 Target Resolution Date: 12/18/2022 Goal Status: Active Patient/caregiver will verbalize/demonstrate measures taken to improve the patient's personal safety Date Initiated: 08/31/2022 Target Resolution Date: 01/15/2023 Goal Status: Active Interventions: Provide education on basic hygiene Provide education on fall prevention Notes: Wound/Skin Impairment Nursing Diagnoses: Impaired tissue integrity Knowledge deficit related to ulceration/compromised skin integrity Goals: Patient/caregiver will verbalize understanding of skin care regimen Date Initiated: 08/31/2022 Target Resolution Date: 12/19/2022 Goal Status: Active Interventions: Assess patient/caregiver ability to obtain necessary supplies Assess patient/caregiver ability to perform ulcer/skin care regimen upon admission and as needed Assess ulceration(s) every visit Treatment Activities: Skin care regimen initiated : 08/31/2022 Topical wound management initiated : 08/31/2022 [name], [name] ([id number]) [phone].pdf Page 4 of 6 Notes: Electronic Signature(s) Signed: 12/29/2022 7:49:05 AM By: [name] Entered By: [name] on 11/19/2022 10:12:14 -------------------------------------------------------------------------------- Pain Assessment Details Patient Name: Date of Service: [name]. 11/19/2022 9:45 [name] Medical Record Number: [id number] Patient Account Number: [id number] Date of Birth/Sex: Treating RN: May 01, 1956 (67 y.o.  M) Primary Care Jerianne Anselmo: [name] Other Clinician: Referring Keldan Eplin: Treating Kjuan Seipp/Extender: [name] in Treatment: 11 Active Problems Location of Pain Severity and Description of Pain Patient Has Paino No Site Locations Pain Management and Medication Current Pain Management: Electronic Signature(s) Signed: 11/19/2022 2:59:12 PM By: [name] Entered By: [name] on 11/19/2022 09:44:27 -------------------------------------------------------------------------------- Patient/Caregiver Education Details Patient Name: Date of Service: [name]. 5/2/2024andnbsp9:45 [name] Medical Record Number: [id number] Patient Account Number: [id number] Date of Birth/Gender: Treating RN: [birth year] (67 y.o. [name] Primary Care Physician: [name] Other Clinician: Referring Physician: Treating Physician/Extender: [name] in Treatment: 11 Education Assessment Education Provided To: Patient Education Topics Provided Wound/Skin Impairment: Methods: Explain/Verbal Responses: State content correctly [address], Sadat [name] ([id number]) [phone].pdf Page 5 of 6 Electronic Signature(s) Signed: 12/29/2022 7:49:05 AM By: [name] Entered By: [name] on 11/19/2022 10:12:33 -------------------------------------------------------------------------------- Wound Assessment Details Patient Name: Date of Service: [name], [name]. 11/19/2022 9:45 [name] Medical Record Number: [id number] Patient Account Number: [id number] Date of Birth/Sex: Treating RN: 1955/12/19 (67 y.o. M) Primary Care Sharolyn Weber: [name] Other Clinician: Referring Jammy Plotkin: Treating Lajuanna Pompa/Extender: Tennis Must,  Nicholas Weeks in Treatment: 11 Wound Status Wound Number: 2 Primary Diabetic Wound/Ulcer of the Lower Extremity Etiology: Wound Location: Right, Plantar Foot Wound  Open Wounding Event: Gradually Appeared Status: Date Acquired: 07/08/2022 Comorbid Sleep Apnea, Arrhythmia, Coronary Artery Disease, Deep Vein Weeks Of Treatment: 11 History: Thrombosis, Hypertension, Myocardial Infarction, Peripheral Arterial Clustered Wound: No Disease, Type II Diabetes, Neuropathy Photos Wound Measurements Length: (cm) 1.1 Width: (cm) 1.5 Depth: (cm) 0.1 Area: (cm) 1.296 Volume: (cm) 0.13 % Reduction in Area: 49.2% % Reduction in Volume: 49% Epithelialization: Medium (34-66%) Wound Description Classification: Grade 1 Wound Margin: Distinct, outline attached Exudate Amount: Medium Exudate Type: Serosanguineous Exudate Color: red, brown Foul Odor After Cleansing: No Slough/Fibrino Yes Wound Bed Granulation Amount: Medium (34-66%) Exposed Structure Granulation Quality: Red Fascia Exposed: No Necrotic Amount: Medium (34-66%) Fat Layer (Subcutaneous Tissue) Exposed: Yes Necrotic Quality: Adherent Slough Tendon Exposed: No Muscle Exposed: No Joint Exposed: No Bone Exposed: No Periwound Skin Texture Texture Color No Abnormalities Noted: No No Abnormalities Noted: Yes Callus: Yes Temperature / Pain Temperature: No Abnormality Moisture No Abnormalities Noted: Yes Electronic Signature(s) Signed: 11/19/2022 2:59:12 PM By: [name] Entered By: [name] on 11/19/2022 10:00:51 [name], [name] ([id number]) [id number] Page 6 of 6 -------------------------------------------------------------------------------- Vitals Details Patient Name: Date of Service: [name], [name]. 11/19/2022 9:45 [name] Medical Record Number: [id number] Patient Account Number: [id number] Date of Birth/Sex: Treating RN: 27-Sep-1955 (67 y.o. M) Primary Care Wisam Siefring: [name] Other Clinician: Referring Marcey Persad: Treating Faiza Bansal/Extender: [name] in Treatment: 11 Vital Signs Time Taken: 09:44 Temperature (F):  98.7 Height (in): 70 Pulse (bpm): 56 Weight (lbs): 260 Respiratory Rate (breaths/min): 20 Body Mass Index (BMI): 37.3 Blood Pressure (mmHg): 161/53 Reference Range: 80 - 120 mg / dl Electronic Signature(s) Signed: 11/19/2022 2:59:12 PM By: [name] Entered By: [name] on 11/19/2022 09:44:21

## 2022-12-29 NOTE — Telephone Encounter (Signed)
[name] from [facility] called to reports patient does not want to continue PT. He said " he is feeling better and therapy is no longer needed"

## 2022-12-30 ENCOUNTER — Other Ambulatory Visit: Payer: Self-pay

## 2022-12-30 ENCOUNTER — Telehealth: Payer: Self-pay | Admitting: Gastroenterology

## 2022-12-30 DIAGNOSIS — Z86010 Personal history of colonic polyps: Secondary | ICD-10-CM

## 2022-12-30 NOTE — Telephone Encounter (Signed)
This patient is due for a surveillance colonoscopy with me.  Multiple polyps removed October 2023.  He is also on both Plavix and Eliquis, and his procedure needs to be done at the hospital due to his medical issues.  As it stands now, I believe my August outpatient procedure block has availability.  Please contact this patient and ask him to schedule his colonoscopy on that date, especially considering our waiting list for hospital-based procedures.  Once the date is set, he then needs a clinic appointment with an APP in July for in person evaluation, prep instructions and management of anticoagulation.   - Dr. Myrtie Neither

## 2022-12-30 NOTE — Telephone Encounter (Signed)
Lm on vm for patient to return call.   Patient has been scheduled for OV with [place], PA-C on Friday, 01/22/23 at 1:30 pm. Colonoscopy scheduled at [facility] on 02/25/23 at 8:30 am, arriving at 7 am.

## 2022-12-31 NOTE — Telephone Encounter (Signed)
I have spoken to [name] regarding his need for repeat colonoscopy to follow up on multiple adenomatous polyps found at October colonoscopy. Advised [name] that his procedure must be completed in the hospital setting and advised that we currently have him set to have procedure on 02/25/23. I also advised that Dr Myrtie Neither would like him to be seen in the office prior to that procedure for in person evaluation, anti-coag recommendations and prep instructions. Advised he is scheduled to see Boone Master, PA-C on 01/22/23 for this. The [name] verbalizes understanding of this information. He states that he is not 100% sure if he will be able to go forward with colonoscopy in August as he currently has a large cast on his leg and is having to follow with wound care. I advised for him to keep July appointment and we will change plans if needed at that time. [name] is in agreement with this.

## 2023-01-05 ENCOUNTER — Encounter (HOSPITAL_BASED_OUTPATIENT_CLINIC_OR_DEPARTMENT_OTHER): Payer: Medicare HMO | Admitting: General Surgery

## 2023-01-05 ENCOUNTER — Telehealth: Payer: Self-pay | Admitting: Family Medicine

## 2023-01-05 DIAGNOSIS — I2510 Atherosclerotic heart disease of native coronary artery without angina pectoris: Secondary | ICD-10-CM | POA: Diagnosis not present

## 2023-01-05 DIAGNOSIS — E1151 Type 2 diabetes mellitus with diabetic peripheral angiopathy without gangrene: Secondary | ICD-10-CM | POA: Diagnosis not present

## 2023-01-05 DIAGNOSIS — E1165 Type 2 diabetes mellitus with hyperglycemia: Secondary | ICD-10-CM | POA: Diagnosis not present

## 2023-01-05 DIAGNOSIS — L97512 Non-pressure chronic ulcer of other part of right foot with fat layer exposed: Secondary | ICD-10-CM | POA: Diagnosis not present

## 2023-01-05 DIAGNOSIS — Z7902 Long term (current) use of antithrombotics/antiplatelets: Secondary | ICD-10-CM | POA: Diagnosis not present

## 2023-01-05 DIAGNOSIS — Z7901 Long term (current) use of anticoagulants: Secondary | ICD-10-CM | POA: Diagnosis not present

## 2023-01-05 DIAGNOSIS — E1140 Type 2 diabetes mellitus with diabetic neuropathy, unspecified: Secondary | ICD-10-CM | POA: Diagnosis not present

## 2023-01-05 DIAGNOSIS — E11621 Type 2 diabetes mellitus with foot ulcer: Secondary | ICD-10-CM | POA: Diagnosis not present

## 2023-01-05 DIAGNOSIS — I110 Hypertensive heart disease with heart failure: Secondary | ICD-10-CM | POA: Diagnosis not present

## 2023-01-05 DIAGNOSIS — I5032 Chronic diastolic (congestive) heart failure: Secondary | ICD-10-CM | POA: Diagnosis not present

## 2023-01-05 NOTE — Telephone Encounter (Signed)
Please reach out to pt first and endocrinology for more precise instructions. Ty.

## 2023-01-05 NOTE — Progress Notes (Signed)
[name], [name] ([id number]) [id number] Page 1 of 11 Visit Report for 01/05/2023 Chief Complaint Document Details Patient Name: Date of Service: [name], [name]. 01/05/2023 10:45 [name] Medical Record Number: [id number] Patient Account Number: [id number] Date of Birth/Sex: Treating RN: 1956-03-23 (67 y.o. M) Primary Care Provider: Arva Chafe Other Clinician: Referring Provider: Treating Provider/Extender: [name] in Treatment: 18 Information Obtained from: Patient Chief Complaint 05/14/2020; patient is here for review of an abrasion injury on the left lateral calf 08/31/2022: [name] (1st metatarsal base, plantar) Electronic Signature(s) Signed: 01/05/2023 10:50:34 AM By: Duanne Guess MD FACS Entered By: Duanne Guess on 01/05/2023 10:50:34 -------------------------------------------------------------------------------- Cellular or Tissue Based Product Details Patient Name: Date of Service: [name]. 01/05/2023 10:45 [name] Medical Record Number: [id number] Patient Account Number: [id number] Date of Birth/Sex: Treating RN: Aug 23, 1955 (67 y.o. M) Primary Care Provider: Arva Chafe Other Clinician: Referring Provider: Treating Provider/Extender: [name] in Treatment: 18 Cellular or Tissue Based Product Type Wound #2 Right,Plantar Foot Applied to: Performed By: Physician Duanne Guess, MD Cellular or Tissue Based Product Type: Apligraf Level of Consciousness (Pre-procedure): Awake and Alert Pre-procedure Verification/Time Out Yes - 10:37 Taken: Location: genitalia / hands / feet / multiple digits Wound Size (sq cm): 1.1 Product Size (sq cm): 44 Waste Size (sq cm): 33 Waste Reason: wound size Amount of Product Applied (sq cm): 11 Instrument Used: Forceps, Scissors Lot #: [id number] Expiration Date: 01/13/2023 Fenestrated: Yes Instrument:  Blade Reconstituted: Yes Solution Type: Normal Saline Solution Amount: 10 ml Lot #: [id number] KS Solution Expiration Date: 08/10/2024 Secured: Yes Secured With: Steri-Strips Dressing Applied: Yes Primary Dressing: adaptic Procedural Pain: 0 [name] ([id number]) [phone].pdf Page 2 of 11 Post Procedural Pain: 0 Response to Treatment: Procedure was tolerated well Level of Consciousness (Post- Awake and Alert procedure): Post Procedure Diagnosis Same as Pre-procedure Notes scribed for Dr. Lady Gary by Samuella Bruin, RN Electronic Signature(s) Signed: 01/05/2023 10:50:28 AM By: Duanne Guess MD FACS Entered By: Duanne Guess on 01/05/2023 10:50:28 -------------------------------------------------------------------------------- HPI Details Patient Name: Date of Service: [name]. 01/05/2023 10:45 [name] Medical Record Number: [id number] Patient Account Number: [id number] Date of Birth/Sex: Treating RN: 1956-01-13 (67 y.o. M) Primary Care Provider: Arva Chafe Other Clinician: Referring Provider: Treating Provider/Extender: [name] in Treatment: 18 History of Present Illness HPI Description: ADMISSION 05/14/2020; this is [name] with multiple medical issues. Predominantly he has type 2 diabetes with [name] and also history of fairly significant PAD. He had [name] in February 2017 he also had an atherectomy and angioplasty by Dr. [name] of the right popliteal artery in 2016. He is supposed to be getting arterial studies annually however this was interrupted last year because of the pandemic. He tells [place] he was at [facility] 2 weeks ago was getting out of of the scooter and traumatized his left lateral lower leg. There was [name] as the patient is on Plavix and Eliquis. They have been dressing this with Neosporin  and doing [name] fairly good job. Wound measures 2.5 x 3.5 it does not have any depth he does not have [name] in his legs outside of surgery however he does have chronic edema and skin changes suggestive of chronic venous disease possibly some degree of lymphedema as well. Past medical history includes type 2 diabetes with peripheral neuropathy and gait instability, lumbar spondylosis,  obstructive sleep apnea, history of [name], basal cell skin cancer, atrial fibrillation on anticoagulation, significant PAD as noted with [name] and [name] in 30th 2016. He also has [name] of coronary artery disease with an MI in 2002 hypertension hyperlipidemia and heart failure with preserved ejection fraction His last arterial studies I can see in epic were on 03/10/2018 this showed [name] and [name] with monophasic waveforms on the right. On the left his ABI was 1.20 with [name] and triphasic waveforms. He has not had arterial studies since. Our nurse in the clinic got an ABI on the left of 1.1 11/2; left anterior leg wound in the setting of chronic venous insufficiency. Wound was initially trauma. We have been using Hydrofera Blue under compression he has home health.. The wound looks [name] lot better today with improvement in surface area 11/16; left anterior leg wound in the setting of chronic venous insufficiency. Wound was initially trauma. We have been using Hydrofera Blue under compression. The patient is closed today. He is supposed to follow-up with vein and vascular with regards to arterial insufficiency nevertheless his leg wounds are 12/3; apparently 2 weeks ago when they were putting on their stockings they managed to get 3 wounds on the left anterior lower leg from abrasion when putting on the stockings. Home health came by the week of  Thanksgiving and put Hydrofera Blue 4-layer wrap on this and there is only one superficial area remaining. The patient and his wife complained about the difficulties getting stockings on I think we are using 20/30. We will order bilateral external compression stockings which should be easier. 12/10; wound on the left anterior lower leg is closed. He has chronic venous insufficiency we ordered him Farrow wrap stockings unfortunately he did not bring these in. 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 He says that it has been present since mid December. He is currently residing in [facility] until March and [name] podiatrist has been looking after him there. They have been simply painting the area with Betadine. He has not had any lower extremity arterial studies since 2019, at which time his right ABI was 0.69. Measured in clinic today, it was 0.71. He is not aware of his most recent hemoglobin A1c, but historically he has had exceptionally poor control. On the basis of his right first metatarsal, there is [name] small crescent shaped wound. There is surrounding eschar and callus. There is no malodor or purulent drainage. 09/08/2022: The original wound is smaller today and fairly clean, but there is some discoloration and [name] pulpy texture to the adjacent callus. Underneath this, the tissue is open exposing the fat layer. It looks like perhaps there was [name] crack in the callus and moisture got under the skin and caused breakdown. 09/15/2022: There has been more moisture related tissue breakdown. The callus is very soft and the underlying tissue is more open. 09/28/2022: The wound looks much better. He has done [name] good job keeping it dry. There is some callus overlying much of the wound surface. There is slough on Berger, Irvin [name] ([id number]) [phone].pdf Page 3 of 11 the exposed open areas. 10/14/2022: There has been [name] lot of  moisture related tissue breakdown. He is still forming callus over the top  and then it seems that moisture gets underneath the callus and causes tissue damage. There is slough on the exposed wound surface. They will be moving back to the [place] area [place] the [place] this [place]. 10/21/2022: His foot is less wet, but there is no significant change to his wound. He has developed [name] blister on his left anterior tibial surface. There is no open wound here, but he does have some fairly significant edema. We are planning to apply [name] total contact cast today. 10/23/2022: Here for his obligatory first cast change. He says he has not had any issues wearing the cast or walking in the boot. No detrimental effects on his wound. 10/29/2022: The wound is looking better. Clearly, putting him in the cast has prevented water from getting in under his callus and causing further tissue breakdown. The blister on his left anterior tibial surface has not yet ruptured. Edema control is significantly improved. 11/05/2022: The wound on his right plantar foot is getting better. There is still some callus accumulation around the wounds, but there are just 2 open areas now, [name] very small 1 on the medial aspect of his metatarsal head and [name] little bit larger 1 on the plantar surface. The blister on his left anterior tibial surface is now open with hanging dry skin. 11/12/2022: The left anterior tibial wound is closed. The wound on his right plantar foot is smaller with just [name] little bit of callus around the edges. 11/19/2022: The right plantar foot wound continues to contract. The surface is quite clean. The tiny satellite area on the medial aspect of his foot has closed. 11/26/2022: The wound is smaller again today. He is responding well to the offloading effects of total contact casting. 12/03/2022: The wound is about the same size today. There is [name] little bit of moisture around the perimeter of the wound. No significant tissue  breakdown. 12/10/2022: The wound is smaller today. There has been no moisture-related tissue breakdown of any maceration. 12/21/2022: The wound continues to contract. Moisture control is excellent. 12/28/2022: The wound is [name] little bit smaller today. 01/05/2023: The wound measures smaller today. Excellent moisture control with zero evidence of maceration. The wound surface is clean with healthy granulation tissue. Electronic Signature(s) Signed: 01/05/2023 10:51:15 AM By: Duanne Guess MD FACS Entered By: Duanne Guess on 01/05/2023 10:51:15 -------------------------------------------------------------------------------- Physical Exam Details Patient Name: Date of Service: [name]. 01/05/2023 10:45 [name] Medical Record Number: [id number] Patient Account Number: [id number] Date of Birth/Sex: Treating RN: 10-10-1955 (67 y.o. M) Primary Care Provider: Arva Chafe Other Clinician: Referring Provider: Treating Provider/Extender: [name] in Treatment: 18 Constitutional Hypertensive, asymptomatic. . . . no acute distress. Respiratory Normal work of breathing on room air. Notes 01/05/2023: The wound measures smaller today. Excellent moisture control with zero evidence of maceration. The wound surface is clean with healthy granulation tissue. Electronic Signature(s) Signed: 01/05/2023 10:51:55 AM By: Duanne Guess MD FACS Entered By: Duanne Guess on 01/05/2023 10:51:55 Physician Orders Details -------------------------------------------------------------------------------- Doyce Para ([id number]) [id number] Page 4 of 11 Patient Name: Date of Service: KRAUSHAAR, [name]. 01/05/2023 10:45 [name] Medical Record Number: [id number] Patient Account Number: [id number] Date of Birth/Sex: Treating RN: 1955-11-07 (67 y.o. [name] Primary Care Provider: Arva Chafe Other Clinician: Referring Provider: Treating  Provider/Extender: [name] in Treatment: 64 Verbal / Phone Orders: No Diagnosis Coding ICD-10 Coding Code Description L97.512 Non-pressure chronic ulcer of other part of right foot with fat  layer exposed I73.9 Peripheral vascular disease, unspecified I25.10 Atherosclerotic heart disease of native coronary artery without angina pectoris I10 Essential (primary) hypertension E11.65 Type 2 diabetes mellitus with hyperglycemia E11.40 Type 2 diabetes mellitus with diabetic neuropathy, unspecified I63.9 Cerebral infarction, unspecified Follow-up Appointments ppointment in 1 week. - Dr. Lady Gary - room 2 LEAVE ON APLIGRAF JUST CHANGE CAST Return [name] Anesthetic (In clinic) Topical Lidocaine 5% applied to wound bed Cellular or Tissue Based Products Wound #2 Right,Plantar Foot Cellular or Tissue Based Product Type: - Apligraf #5 Cellular or Tissue Based Product applied to wound bed, secured with steri-strips, cover with Adaptic or Mepitel. (DO NOT REMOVE). Bathing/ Shower/ Hygiene May shower with protection but do not get wound dressing(s) wet. Protect dressing(s) with water repellant cover (for example, large plastic bag) or [name] cast cover and may then take shower. Edema Control - Lymphedema / SCD / Other Elevate legs to the level of the heart or above for 30 minutes daily and/or when sitting for 3-4 times [name] day throughout the day. Avoid standing for long periods of time. Patient to wear own compression stockings every day. Moisturize legs daily. Off-Loading Removable cast walker boot to: - right leg Wound Treatment Wound #2 - Foot Wound Laterality: Plantar, Right Cleanser: Soap and Water 1 x Per Week/30 Days Discharge Instructions: May shower and wash wound with dial antibacterial soap and water prior to dressing change. Cleanser: Wound Cleanser 1 x Per Week/30 Days Discharge Instructions: Cleanse the wound with wound cleanser prior to applying [name] clean  dressing using gauze sponges, not tissue or cotton balls. Peri-Wound Care: Zinc Oxide Ointment 30g tube 1 x Per Week/30 Days Discharge Instructions: Apply Zinc Oxide to periwound with each dressing change Prim Dressing: ADAPTIC TOUCH 3x4.25 (in/in) 1 x Per Week/30 Days ary Discharge Instructions: Apply to wound bed as instructed Secondary Dressing: Woven Gauze Sponge, Non-Sterile 4x4 in 1 x Per Week/30 Days Discharge Instructions: Apply over primary dressing as directed. Secondary Dressing: Zetuvit Plus 4x8 in 1 x Per Week/30 Days Discharge Instructions: Apply over primary dressing as directed. Secured With: [employer], 4.5x3.1 (in/yd) 1 x Per Week/30 Days Discharge Instructions: Secure with Kerlix as directed. Patient Medications llergies: No Known Drug Allergies [name] Notifications Medication Indication Start End 01/05/2023 lidocaine DOSE topical 5 % ointment - ointment topical Methvin, Claus [name] ([id number]) [phone].pdf Page 5 of 11 Electronic Signature(s) Signed: 01/05/2023 11:49:02 AM By: Duanne Guess MD FACS Entered By: Duanne Guess on 01/05/2023 10:52:15 -------------------------------------------------------------------------------- Problem List Details Patient Name: Date of Service: [name]. 01/05/2023 10:45 [name] Medical Record Number: [id number] Patient Account Number: [id number] Date of Birth/Sex: Treating RN: 09/08/1955 (67 y.o. M) Primary Care Provider: Arva Chafe Other Clinician: Referring Provider: Treating Provider/Extender: [name] in Treatment: 18 Active Problems ICD-10 Encounter Code Description Active Date MDM Diagnosis L97.512 Non-pressure chronic ulcer of other part of right foot with fat layer exposed 08/31/2022 No Yes I73.9 Peripheral vascular disease, unspecified 08/31/2022 No Yes I25.10 Atherosclerotic heart disease of native coronary artery without angina pectoris 08/31/2022  No Yes I10 Essential (primary) hypertension 08/31/2022 No Yes E11.65 Type 2 diabetes mellitus with hyperglycemia 08/31/2022 No Yes E11.40 Type 2 diabetes mellitus with diabetic neuropathy, unspecified 08/31/2022 No Yes I63.9 Cerebral infarction, unspecified 08/31/2022 No Yes Inactive Problems Resolved Problems ICD-10 Code Description Active Date Resolved Date L97.821 Non-pressure chronic ulcer of other part of left lower leg limited to breakdown of skin 11/05/2022 11/05/2022 Electronic Signature(s) Signed: 01/05/2023 10:48:45 AM By: Duanne Guess  MD FACS Entered By: Duanne Guess on 01/05/2023 10:48:45 [name], [name] ([id number]) [id number] Page 6 of 11 -------------------------------------------------------------------------------- Progress Note Details Patient Name: Date of Service: [name], [name]. 01/05/2023 10:45 [name] Medical Record Number: [id number] Patient Account Number: [id number] Date of Birth/Sex: Treating RN: 1955/12/22 (67 y.o. M) Primary Care Provider: Arva Chafe Other Clinician: Referring Provider: Treating Provider/Extender: [name] in Treatment: 18 Subjective Chief Complaint Information obtained from Patient 05/14/2020; patient is here for review of an abrasion injury on the left lateral calf 08/31/2022: [name] (1st metatarsal base, plantar) History of Present Illness (HPI) ADMISSION 05/14/2020; this is [name] with multiple medical issues. Predominantly he has type 2 diabetes with [name] and also history of fairly significant PAD. He had [name] in February 2017 he also had an atherectomy and angioplasty by Dr. [name] of the right popliteal artery in 2016. He is supposed to be getting arterial studies annually however this was interrupted last year because of the pandemic. He tells [place] he was at [facility] 2  weeks ago was getting out of of the scooter and traumatized his left lateral lower leg. There was [name] as the patient is on Plavix and Eliquis. They have been dressing this with Neosporin and doing [name] fairly good job. Wound measures 2.5 x 3.5 it does not have any depth he does not have [name] in his legs outside of surgery however he does have chronic edema and skin changes suggestive of chronic venous disease possibly some degree of lymphedema as well. Past medical history includes type 2 diabetes with peripheral neuropathy and gait instability, lumbar spondylosis, obstructive sleep apnea, history of [name], basal cell skin cancer, atrial fibrillation on anticoagulation, significant PAD as noted with [name] and [name] in 30th 2016. He also has [name] of coronary artery disease with an MI in 2002 hypertension hyperlipidemia and heart failure with preserved ejection fraction His last arterial studies I can see in epic were on 03/10/2018 this showed [name] and [name] with monophasic waveforms on the right. On the left his ABI was 1.20 with [name] and triphasic waveforms. He has not had arterial studies since. Our nurse in the clinic got an ABI on the left of 1.1 11/2; left anterior leg wound in the setting of chronic venous insufficiency. Wound was initially trauma. We have been using Hydrofera Blue under compression he has home health.. The wound looks [name] lot better today with improvement in surface area 11/16; left anterior leg wound in the setting of chronic venous insufficiency. Wound was initially trauma. We have been using Hydrofera Blue under compression. The patient is closed today. He is supposed to follow-up with vein and vascular with regards to arterial insufficiency nevertheless his leg wounds are 12/3;  apparently 2 weeks ago when they were putting on their stockings they managed to get 3 wounds on the left anterior lower leg from abrasion when putting on the stockings. Home health came by the week of Thanksgiving and put Hydrofera Blue 4-layer wrap on this and there is only one superficial area remaining. The patient and his wife complained about the difficulties getting stockings on I think we are using 20/30. We will order bilateral external  compression stockings which should be easier. 12/10; wound on the left anterior lower leg is closed. He has chronic venous insufficiency we ordered him Farrow wrap stockings unfortunately he did not bring these in. 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 He says that it has been present since mid December. He is currently residing in [facility] until March and [name] podiatrist has been looking after him there. They have been simply painting the area with Betadine. He has not had any lower extremity arterial studies since 2019, at which time his right ABI was 0.69. Measured in clinic today, it was 0.71. He is not aware of his most recent hemoglobin A1c, but historically he has had exceptionally poor control. On the basis of his right first metatarsal, there is [name] small crescent shaped wound. There is surrounding eschar and callus. There is no malodor or purulent drainage. 09/08/2022: The original wound is smaller today and fairly clean, but there is some discoloration and [name] pulpy texture to the adjacent callus. Underneath this, the tissue is open exposing the fat layer. It looks like perhaps there was [name] crack in the callus and moisture got under the skin and caused breakdown. 09/15/2022: There has been more moisture related tissue breakdown. The callus is very soft and the underlying tissue is more open. 09/28/2022: The wound looks much better. He has done [name] good job keeping it dry. There is some  callus overlying much of the wound surface. There is slough on the exposed open areas. 10/14/2022: There has been [name] lot of moisture related tissue breakdown. He is still forming callus over the top and then it seems that moisture gets underneath the callus and causes tissue damage. There is slough on the exposed wound surface. They will be moving back to the local area [place] the [place] this weekend. 10/21/2022: His foot is less wet, but there is no significant change to his wound. He has developed [name] blister on his left anterior tibial surface. There is no open wound here, but he does have some fairly significant edema. We are planning to apply [name] total contact cast today. 10/23/2022: Here for his obligatory first cast change. He says he has not had any issues wearing the cast or walking in the boot. No detrimental effects on his wound. 10/29/2022: The wound is looking better. Clearly, putting him in the cast has prevented water from getting in under his callus and causing further tissue breakdown. The blister on his left anterior tibial surface has not yet ruptured. Edema control is significantly improved. 11/05/2022: The wound on his right plantar foot is getting better. There is still some callus accumulation around the wounds, but there are just 2 open areas now, [name] very small 1 on the medial aspect of his metatarsal head and [name] little bit larger 1 on the plantar surface. The blister on his left anterior tibial surface is now open with hanging dry skin. [name], [name] ([id number]) [id number] Page 7 of 11 11/12/2022: The left anterior tibial wound is closed. The wound on his right plantar foot is smaller with just [name] little bit of callus around the edges. 11/19/2022: The right plantar foot wound continues to contract. The surface is quite clean. The tiny satellite area on the medial aspect of his foot has closed. 11/26/2022: The wound is smaller again today. He is responding well to the  offloading effects of total contact casting. 12/03/2022: The wound is  about the same size today. There is [name] little bit of moisture around the perimeter of the wound. No significant tissue breakdown. 12/10/2022: The wound is smaller today. There has been no moisture-related tissue breakdown of any maceration. 12/21/2022: The wound continues to contract. Moisture control is excellent. 12/28/2022: The wound is [name] little bit smaller today. 01/05/2023: The wound measures smaller today. Excellent moisture control with zero evidence of maceration. The wound surface is clean with healthy granulation tissue. Patient History Information obtained from Patient. Family History Unknown History. Social History Never smoker, Marital Status - Single, Alcohol Use - Never, Drug Use - No History, Caffeine Use - Rarely. Medical History Respiratory Patient has history of Sleep Apnea Cardiovascular Patient has history of Arrhythmia - [name]-Fib, Coronary Artery Disease - s/p CABG, Deep Vein Thrombosis, Hypertension, Myocardial Infarction, Peripheral Arterial Disease - s/p Fempop x 2 Endocrine Patient has history of Type II Diabetes Neurologic Patient has history of Neuropathy Hospitalization/Surgery History - colonoscopy. - polypectomy. - peripheral vascular cath. - shoulder arthroscopy. - carpal tunnel release. - coronary artery bypass. - appendectomy. - cardiac cath. - coronary angioplasty. - thrombectomy. - knee arthroplasty. - popliteal artery stent. 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 no acute distress. Vitals Time Taken: 10:21 AM, Height: 70 in, Weight: 260 lbs, BMI: 37.3, Temperature: 98.2 F, Pulse: 69 bpm, Respiratory Rate: 18 breaths/min, Blood Pressure: 187/73 mmHg. Respiratory Normal work of breathing on room air. General Notes: 01/05/2023: The  wound measures smaller today. Excellent moisture control with zero evidence of maceration. The wound surface is clean with healthy granulation tissue. Integumentary (Hair, Skin) Wound #2 status is Open. Original cause of wound was Gradually Appeared. The date acquired was: 07/08/2022. The wound has been in treatment 18 weeks. The wound is located on the Right,Plantar Foot. The wound measures 1cm length x 1.1cm width x 0.2cm depth; 0.864cm^2 area and 0.173cm^3 volume. There is Fat Layer (Subcutaneous Tissue) exposed. There is no tunneling or undermining noted. There is [name] medium amount of serosanguineous drainage noted. The wound margin is distinct with the outline attached to the wound base. There is large (67-100%) red granulation within the wound bed. There is [name] small (1-33%) amount of necrotic tissue within the wound bed including Adherent Slough. The periwound skin appearance had no abnormalities noted for color. The periwound skin appearance exhibited: Callus. The periwound skin appearance did not exhibit: Maceration. Periwound temperature was noted as No Abnormality. Assessment Prouse, Mihailo [name] ([id number]) [phone].pdf Page 8 of 11 Active Problems ICD-10 Non-pressure chronic ulcer of other part of right foot with fat layer exposed Peripheral vascular disease, unspecified Atherosclerotic heart disease of native coronary artery without angina pectoris Essential (primary) hypertension Type 2 diabetes mellitus with hyperglycemia Type 2 diabetes mellitus with diabetic neuropathy, unspecified Cerebral infarction, unspecified Procedures Wound #2 Pre-procedure diagnosis of Wound #2 is [name] Diabetic Wound/Ulcer of the Lower Extremity located on the Right,Plantar Foot. [name] skin graft procedure using [name] bioengineered skin substitute/cellular or tissue based product was performed by Duanne Guess, MD with the following instrument(s): Forceps and Scissors. Apligraf was applied and  secured with Steri-Strips. 11 sq cm of product was utilized and 33 sq cm was wasted due to wound size. Post Application, adaptic was applied. [name] Time Out was conducted at 10:37, prior to the start of the procedure. The procedure was tolerated well with [name] pain level of 0 throughout and [name] pain level of  0 following the procedure. Post procedure Diagnosis Wound #2: Same as Pre-Procedure General Notes: scribed for Dr. Lady Gary by Samuella Bruin, RN. Pre-procedure diagnosis of Wound #2 is [name] Diabetic Wound/Ulcer of the Lower Extremity located on the Right,Plantar Foot . There was [name] T Contact Cast otal Procedure by Duanne Guess, MD. Post procedure Diagnosis Wound #2: Same as Pre-Procedure Notes: scribed for Dr. Lady Gary by Samuella Bruin, RN. Plan Follow-up Appointments: Return Appointment in 1 week. - Dr. Lady Gary - room 2 LEAVE ON APLIGRAF JUST CHANGE CAST Anesthetic: (In clinic) Topical Lidocaine 5% applied to wound bed Cellular or Tissue Based Products: Wound #2 Right,Plantar Foot: Cellular or Tissue Based Product Type: - Apligraf #5 Cellular or Tissue Based Product applied to wound bed, secured with steri-strips, cover with Adaptic or Mepitel. (DO NOT REMOVE). Bathing/ Shower/ Hygiene: May shower with protection but do not get wound dressing(s) wet. Protect dressing(s) with water repellant cover (for example, large plastic bag) or [name] cast cover and may then take shower. Edema Control - Lymphedema / SCD / Other: Elevate legs to the level of the heart or above for 30 minutes daily and/or when sitting for 3-4 times [name] day throughout the day. Avoid standing for long periods of time. Patient to wear own compression stockings every day. Moisturize legs daily. Off-Loading: Removable cast walker boot to: - right leg The following medication(s) was prescribed: lidocaine topical 5 % ointment ointment topical was prescribed at facility WOUND #2: - Foot Wound Laterality: Plantar, Right Cleanser:  Soap and Water 1 x Per Week/30 Days Discharge Instructions: May shower and wash wound with dial antibacterial soap and water prior to dressing change. Cleanser: Wound Cleanser 1 x Per Week/30 Days Discharge Instructions: Cleanse the wound with wound cleanser prior to applying [name] clean dressing using gauze sponges, not tissue or cotton balls. Peri-Wound Care: Zinc Oxide Ointment 30g tube 1 x Per Week/30 Days Discharge Instructions: Apply Zinc Oxide to periwound with each dressing change Prim Dressing: ADAPTIC TOUCH 3x4.25 (in/in) 1 x Per Week/30 Days ary Discharge Instructions: Apply to wound bed as instructed Secondary Dressing: Woven Gauze Sponge, Non-Sterile 4x4 in 1 x Per Week/30 Days Discharge Instructions: Apply over primary dressing as directed. Secondary Dressing: Zetuvit Plus 4x8 in 1 x Per Week/30 Days Discharge Instructions: Apply over primary dressing as directed. Secured With: [employer], 4.5x3.1 (in/yd) 1 x Per Week/30 Days Discharge Instructions: Secure with Kerlix as directed. 01/05/2023: The wound measures smaller today. Excellent moisture control with zero evidence of maceration. The wound surface is clean with healthy granulation tissue. Unfortunately, though we had intended for the Apligraf to be left on 2 weeks, it was removed by the intake personnel. Therefore we applied Apligraf again today, after repairing the wound bed appropriately. The Apligraf was fenestrated and applied in standard fashion. Zinc oxide was applied to the periwound skin. The skin substitute was secured in place with Adaptic and Steri-Strips. [name] total contact cast was then applied without complication or difficulty. He will return in 1 week. We do plan to leave this Apligraf in situ for total of 2 weeks and all intake staff has been notified. As this is his fifth application, we are going to run his insurance for another skin substitute as he is making nice progress with their use. [name], [name]  ([id number]) [id number] Page 9 of 11 Electronic Signature(s) Signed: 01/05/2023 10:53:52 AM By: Duanne Guess MD FACS Entered By: Duanne Guess on 01/05/2023 10:53:52 -------------------------------------------------------------------------------- HxROS Details Patient Name: Date of Service: Ebers,  [name]. 01/05/2023 10:45 [name] Medical Record Number: [id number] Patient Account Number: [id number] Date of Birth/Sex: Treating RN: Dec 03, 1955 (67 y.o. M) Primary Care Provider: Arva Chafe Other Clinician: Referring Provider: Treating Provider/Extender: [name] in Treatment: 18 Information Obtained From Patient Respiratory Medical History: Positive for: Sleep Apnea Cardiovascular Medical History: Positive for: Arrhythmia - [name]-Fib; Coronary Artery Disease - s/p CABG; Deep Vein Thrombosis; Hypertension; Myocardial Infarction; Peripheral Arterial Disease - s/p Fempop x 2 Past Medical History Notes: CVA x 3 Endocrine Medical History: Positive for: Type II Diabetes Time with diabetes: since 1997 Treated with: Insulin, Oral agents Blood sugar tested every day: Yes Tested : 2-3x per day Musculoskeletal Medical History: Past Medical History Notes: carpal tunnel syndrome Neurologic Medical History: Positive for: Neuropathy Past Medical History Notes: stroke Oncologic Medical History: Past Medical History Notes: skin cancer Immunizations Pneumococcal Vaccine: Received Pneumococcal Vaccination: Yes Received Pneumococcal Vaccination On or After 60th Birthday: Yes Implantable Devices None Hospitalization / Surgery History Type of Hospitalization/Surgery colonoscopy polypectomy [name], [name] ([id number]) [id number] Page 10 of 11 peripheral vascular cath shoulder arthroscopy carpal tunnel release coronary artery bypass appendectomy cardiac cath coronary  angioplasty thrombectomy knee arthroplasty popliteal artery stent Family and Social History Unknown History: Yes; Never smoker; Marital Status - Single; Alcohol Use: Never; Drug Use: No History; Caffeine Use: Rarely; Financial Concerns: No; Food, Clothing or Shelter Needs: No; Support System Lacking: No; Transportation Concerns: No Electronic Signature(s) Signed: 01/05/2023 11:49:02 AM By: Duanne Guess MD FACS Entered By: Duanne Guess on 01/05/2023 10:51:32 -------------------------------------------------------------------------------- Total Contact Cast Details Patient Name: Date of Service: [name] Sabal [name]. 01/05/2023 10:45 [name] Medical Record Number: [id number] Patient Account Number: [id number] Date of Birth/Sex: Treating RN: 12-29-1955 (67 y.o. [name] Primary Care Provider: Arva Chafe Other Clinician: Referring Provider: Treating Provider/Extender: [name] in Treatment: 18 T Contact Cast Applied for Wound Assessment: otal Wound #2 Right,Plantar Foot Performed By: Physician Duanne Guess, MD Post Procedure Diagnosis Same as Pre-procedure Notes scribed for Dr. Lady Gary by Samuella Bruin, RN Electronic Signature(s) Signed: 01/05/2023 11:49:02 AM By: Duanne Guess MD FACS Signed: 01/05/2023 4:05:21 PM By: Samuella Bruin Entered By: Samuella Bruin on 01/05/2023 10:37:13 -------------------------------------------------------------------------------- SuperBill Details Patient Name: Date of Service: [name]. 01/05/2023 Medical Record Number: [id number] Patient Account Number: [id number] Date of Birth/Sex: Treating RN: Dec 14, 1955 (67 y.o. M) Primary Care Provider: Arva Chafe Other Clinician: Referring Provider: Treating Provider/Extender: [name] in Treatment: 18 Diagnosis Coding ICD-10 Codes Code Description [phone] Non-pressure chronic ulcer of other part  of right foot with fat layer exposed I73.9 Peripheral vascular disease, unspecified [name], [name] ([id number]) [phone].pdf Page 11 of 11 I25.10 Atherosclerotic heart disease of native coronary artery without angina pectoris I10 Essential (primary) hypertension E11.65 Type 2 diabetes mellitus with hyperglycemia E11.40 Type 2 diabetes mellitus with diabetic neuropathy, unspecified I63.9 Cerebral infarction, unspecified Facility Procedures : CPT4 Code: 16073710 Description: (Facility Use Only) Apligraf 1 SQ CM Modifier: Quantity: 44 : CPT4 Code: 62694854 Description: 15275 - SKIN SUB GRAFT FACE/NK/HF/G ICD-10 Diagnosis Description L97.512 Non-pressure chronic ulcer of other part of right foot with fat layer exposed Modifier: Quantity: 1 : CPT4 Code: 62703500 Description: 93818 - APPLY TOTAL CONTACT LEG CAST ICD-10 Diagnosis Description L97.512 Non-pressure chronic ulcer of other part of right foot with fat layer exposed Modifier: Quantity: 1 Physician Procedures : CPT4 Code Description Modifier [id number] 99213 - WC PHYS LEVEL 3 - EST PT 25 ICD-10 Diagnosis Description L97.512  Non-pressure chronic ulcer of other part of right foot with fat layer exposed I73.9 Peripheral vascular disease, unspecified E11.40 Type 2  diabetes mellitus with diabetic neuropathy, unspecified E11.65 Type 2 diabetes mellitus with hyperglycemia Quantity: 1 : [id number] 15275 - WC PHYS SKIN SUB GRAFT FACE/NK/HF/G ICD-10 Diagnosis Description L97.512 Non-pressure chronic ulcer of other part of right foot with fat layer exposed Quantity: 1 : [id number] 29445 - WC PHYS APPLY TOTAL CONTACT CAST ICD-10 Diagnosis Description L97.512 Non-pressure chronic ulcer of other part of right foot with fat layer exposed Quantity: 1 Electronic Signature(s) Signed: 01/05/2023 10:54:21 AM By: Duanne Guess MD FACS Entered By: Duanne Guess on 01/05/2023 10:54:20

## 2023-01-05 NOTE — Progress Notes (Signed)
[name], [name] ([id number]) [id number] Page 1 of 7 Visit Report for 01/05/2023 Arrival Information Details Patient Name: Date of Service: [name], [name]. 01/05/2023 10:45 [name] Medical Record Number: [id number] Patient Account Number: [id number] Date of Birth/Sex: Treating RN: 1956-03-09 (67 y.o. M) Primary Care [name]: [name] Other Clinician: Referring [name]: [name] Burns [name]: [name]: 18 Visit Information History Since Last Visit Added or deleted any medications: No Patient Arrived: Ambulatory Any new allergies or adverse reactions: No Arrival Time: 10:20 Had [name] fall or experienced change in No Accompanied By: wife activities of daily living that may affect Transfer Assistance: None risk of falls: Patient Identification Verified: Yes Signs or symptoms of abuse/neglect since last No Secondary Verification Process Completed: Yes visito Patient Requires Transmission-Based Precautions: No Hospitalized since last visit: No Patient Has Alerts: No Implantable device outside of the clinic No excluding cellular tissue based products placed in the center since last visit: Has Dressing in Place as Prescribed: Yes Has Footwear/Offloading in Place as Yes Prescribed: Right: Removable Cast Walker/Walking Boot T Contact Cast otal Pain Present Now: No Electronic Signature(s) Signed: 01/05/2023 4:35:45 PM By: [name] Entered By: [name] 10:21:34 -------------------------------------------------------------------------------- Encounter Discharge Information Details Patient Name: Date of Service: [name] Pian, [name]. 01/05/2023 10:45 [name] Medical Record Number: [id number] Patient Account Number: [id number] Date of Birth/Sex: Treating RN: 1956/01/17 (67 y.o. [name] Primary Care [name] Other Clinician: Referring Krishawn Vanderweele: [name]  [name]/Extender: [name]: 18 Encounter Discharge Information Items Post Procedure Vitals Discharge Condition: Stable Temperature (F): 98.2 Ambulatory Status: Ambulatory Pulse (bpm): 69 Discharge Destination: Home Respiratory Rate (breaths/min): 18 Transportation: Private Auto Blood Pressure (mmHg): 187/73 Accompanied By: PARTNER Schedule Follow-up Appointment: Yes Clinical Summary of Care: Patient Declined Electronic Signature(s) Signed: 01/05/2023 4:05:21 PM By: [name] Entered By: [name] on [name] 10:47:00 Livecchi, [name] ([id number]) [id number] Page 2 of 7 -------------------------------------------------------------------------------- Lower Extremity Assessment Details Patient Name: Date of Service: [name], [name]. 01/05/2023 10:45 [name] Medical Record Number: [id number] Patient Account Number: [id number] Date of Birth/Sex: Treating RN: 29-Sep-1955 (67 y.o. [name] Primary Care [name]: [name] Other Clinician: Referring [name]: [name]: [name]: 18 Edema Assessment Assessed: [name]: No] [name]: No] Edema: [Left: Ye] [Right: s] Calf Left: Right: Point of Measurement: From Medial Instep 37.5 cm Ankle Left: Right: Point of Measurement: From Medial Instep 27 cm Vascular Assessment Pulses: Dorsalis Pedis Palpable: [Right:Yes] Electronic Signature(s) Signed: 01/05/2023 4:05:21 PM By: [name] Entered By: [name] on [name] 10:32:00 -------------------------------------------------------------------------------- Multi Wound Chart Details Patient Name: Date of Service: [name] Pian, [name]. 01/05/2023 10:45 [name] Medical Record Number: [id number] Patient Account Number: [id number] Date of Birth/Sex: Treating RN: [birth year] (67 y.o. 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 [name] Primary Etiology: Extremity Sleep Apnea, Arrhythmia, Coronary [name] [name] Comorbid History: Artery Disease, Deep Vein Thrombosis, Hypertension, Myocardial Infarction, Peripheral Arterial Disease, Type II Diabetes, Neuropathy 07/08/2022 [name] [name] Date Acquired: 18 [name] [name] Weeks of [name]: Open [name] [name] Wound Status: No [name] [name] Wound Recurrence: 1x1.1x0.2 [name] [name] Measurements L x W  x D (cm) 0.864 [name] [name] [name] : rea 0.173 [name] [name] Volume (cm) : 66.20% [name] [name] % Reduction in [name] rea: 32.20% [name] [name] % Reduction in Volume: Grade 1 [name] [name] Classification: Medium [name] [name] Exudate [name] mount: Serosanguineous [name] [name] Exudate Type: red, brown [name] [name] Exudate Color: Distinct, outline attached [name] [name] Wound Margin: Large (67-100%) [name] [name] Granulation [name] mount: Red [name] [name] Granulation Quality: Small (1-33%) [name] [name] Necrotic [name] mount: Fat Layer (Subcutaneous Tissue): Yes [name] [name] Exposed Structures: Fascia: No Tendon: No Muscle: No Joint: No Bone: No Medium (34-66%) [name] [name] Epithelialization: Callus: Yes [name] [name] Periwound Skin Texture: Maceration: No [name] [name] Periwound Skin Moisture: No Abnormalities Noted [name] [name] Periwound Skin Color: No Abnormality [name] [name] Temperature: Cellular or Tissue Based Product [name] [name] Procedures Performed: T Contact Cast otal [name] Notes Wound #2 (Foot) Wound Laterality: Plantar, Right Cleanser Soap and Water Discharge Instruction: May shower and wash wound with dial antibacterial  soap and water prior to dressing change. Wound Cleanser Discharge Instruction: Cleanse the wound with wound cleanser prior to applying [name] clean dressing using gauze sponges, not tissue or cotton balls. Peri-Wound Care Zinc Oxide Ointment 30g tube Discharge Instruction: Apply Zinc Oxide to periwound with each dressing change Topical Primary Dressing ADAPTIC TOUCH 3x4.25 (in/in) Discharge Instruction: Apply to wound bed as instructed Secondary Dressing Woven Gauze Sponge, Non-Sterile 4x4 in Discharge Instruction: Apply over primary dressing as directed. Zetuvit Plus 4x8 in Discharge Instruction: Apply over primary dressing as directed. Secured With [employer], 4.5x3.1 (in/yd) Discharge Instruction: Secure with Kerlix as directed. Compression Wrap Compression Stockings Add-Ons [name], [name] ([id number]) [phone].pdf Page 4 of 7 Electronic Signature(s) Signed: 01/05/2023 10:50:06 AM By: Duanne Guess MD FACS Entered By: Duanne Guess on [name] 10:50:06 -------------------------------------------------------------------------------- Multi-Disciplinary Care Plan Details Patient Name: Date of Service: [name], [name]. 01/05/2023 10:45 [name] Medical Record Number: [id number] Patient Account Number: [id number] Date of Birth/Sex: Treating RN: 22-May-1956 (67 y.o. [name] Primary Care Aliese Brannum: [name] Other Clinician: Referring Mohmed Farver: [name] Kortne All/Extender: [name]: 18 Multidisciplinary Care Plan reviewed with physician Active Inactive Abuse / Safety / Falls / Self Care Management Nursing Diagnoses: Impaired physical mobility Potential for falls Goals: Patient will not experience any injury related to falls Date Initiated: 08/31/2022 Target Resolution Date: 01/15/2023 Goal Status: Active Patient/caregiver will verbalize/demonstrate measures taken to improve the patient's  personal safety Date Initiated: 08/31/2022 Target Resolution Date: 01/15/2023 Goal Status: Active Interventions: Provide education on basic hygiene Provide education on fall prevention Notes: Wound/Skin Impairment Nursing Diagnoses: Impaired tissue integrity Knowledge deficit related to ulceration/compromised skin integrity Goals: Patient/caregiver will verbalize understanding of skin care regimen Date Initiated: 08/31/2022 Target Resolution Date: 01/15/2023 Goal Status: Active Interventions: Assess patient/caregiver ability to obtain necessary supplies Assess patient/caregiver ability to perform ulcer/skin care regimen upon admission and as needed Assess ulceration(s) every visit [name] Activities: Skin care regimen initiated : 08/31/2022 Topical wound management initiated : 08/31/2022 Notes: Electronic Signature(s) Signed: 01/05/2023 4:05:21 PM By: [name] Entered By: [name] on [name] 10:42:36 [name], [name] ([id number]) [id number] Page 5 of 7 -------------------------------------------------------------------------------- Pain Assessment Details Patient Name: Date of Service: [name], [name]. 01/05/2023 10:45 [name] Medical Record Number: [id number] Patient Account Number: [id number] Date of Birth/Sex: Treating RN: 07/11/1956 (67 y.o. M) Primary Care Celine Dishman: [name] Other Clinician: Referring Carollyn Etcheverry: [name] Bee Marchiano/Extender: [name]: 18 Active Problems Location of Pain Severity and Description of  Pain Patient Has Paino No Site Locations Pain Management and Medication Current Pain Management: Electronic Signature(s) Signed: 01/05/2023 4:35:45 PM By: [name] Entered By: [name] 10:22:19 -------------------------------------------------------------------------------- Patient/Caregiver Education Details Patient Name: Date of  Service: [name] 6/18/2024andnbsp10:45 [name] Medical Record Number: [id number] Patient Account Number: [id number] Date of Birth/Gender: Treating RN: May 29, 1956 (67 y.o. [name] Primary Care Physician: [name] Other Clinician: Referring Physician: Treating Physician/Extender: [name]: 18 Education Assessment Education Provided To: Patient Education Topics Provided Wound/Skin Impairment: Methods: Explain/Verbal Responses: Reinforcements needed, State content correctly [name], [name] ([id number]) [id number].pdf Page 6 of 7 Electronic Signature(s) Signed: 01/05/2023 4:05:21 PM By: [name] Entered By: [name] on [name] 10:42:47 -------------------------------------------------------------------------------- Wound Assessment Details Patient Name: Date of Service: [name], [name]. 01/05/2023 10:45 [name] Medical Record Number: [id number] Patient Account Number: [id number] Date of Birth/Sex: Treating RN: 12/01/1955 (67 y.o. M) Primary Care Lanie Schelling: [name] Other Clinician: Referring Annet Manukyan: [name] Jalexa Pifer/Extender: [name]: 18 Wound Status Wound Number: 2 Primary Diabetic Wound/Ulcer of the Lower Extremity Etiology: Wound Location: Right, Plantar Foot Wound Open Wounding Event: Gradually Appeared Status: Date Acquired: 07/08/2022 Comorbid Sleep Apnea, Arrhythmia, Coronary Artery Disease, Deep Vein Weeks Of [name]: 18 History: Thrombosis, Hypertension, Myocardial Infarction, Peripheral Arterial Clustered Wound: No Disease, Type II Diabetes, Neuropathy Photos Wound Measurements Length: (cm) 1 Width: (cm) 1.1 Depth: (cm) 0.2 Area: (cm) 0.864 Volume: (cm) 0.173 % Reduction in Area: 66.2% % Reduction in Volume: 32.2% Epithelialization: Medium (34-66%) Tunneling: No Undermining: No Wound  Description Classification: Grade 1 Wound Margin: Distinct, outline attached Exudate Amount: Medium Exudate Type: Serosanguineous Exudate Color: red, brown Foul Odor After Cleansing: No Slough/Fibrino Yes Wound Bed Granulation Amount: Large (67-100%) Exposed Structure Granulation Quality: Red Fascia Exposed: No Necrotic Amount: Small (1-33%) Fat Layer (Subcutaneous Tissue) Exposed: Yes Necrotic Quality: Adherent Slough Tendon Exposed: No Muscle Exposed: No Joint Exposed: No Bone Exposed: No Periwound Skin Texture Texture Color No Abnormalities Noted: No No Abnormalities Noted: Yes Callus: Yes Temperature / Pain Temperature: No Abnormality Moisture [name], [name] ([id number]) [id number] Page 7 of 7 No Abnormalities Noted: No Maceration: No [name] Notes Wound #2 (Foot) Wound Laterality: Plantar, Right Cleanser Soap and Water Discharge Instruction: May shower and wash wound with dial antibacterial soap and water prior to dressing change. Wound Cleanser Discharge Instruction: Cleanse the wound with wound cleanser prior to applying [name] clean dressing using gauze sponges, not tissue or cotton balls. Peri-Wound Care Zinc Oxide Ointment 30g tube Discharge Instruction: Apply Zinc Oxide to periwound with each dressing change Topical Primary Dressing ADAPTIC TOUCH 3x4.25 (in/in) Discharge Instruction: Apply to wound bed as instructed Secondary Dressing Woven Gauze Sponge, Non-Sterile 4x4 in Discharge Instruction: Apply over primary dressing as directed. Zetuvit Plus 4x8 in Discharge Instruction: Apply over primary dressing as directed. Secured With [employer], 4.5x3.1 (in/yd) Discharge Instruction: Secure with Kerlix as directed. Compression Wrap Compression Stockings Add-Ons Electronic Signature(s) Signed: 01/05/2023 4:05:21 PM By: [name] Entered By: [name] on [name]  10:32:46 -------------------------------------------------------------------------------- Vitals Details Patient Name: Date of Service: [name] Pian, [name]. 01/05/2023 10:45 [name] Medical Record Number: [id number] Patient Account Number: [id number] Date of Birth/Sex: Treating RN: 1956/07/14 (67 y.o. M) Primary Care Kameren Pargas: [name] Other Clinician: Referring Kaitlynne Wenz: [name] Getsemani Lindon/Extender: [name]: 18 Vital Signs Time Taken: 10:21 Temperature (F): 98.2 Height (in): 70 Pulse (bpm): 69 Weight (lbs): 260 Respiratory Rate (  breaths/min): 18 Body Mass Index (BMI): 37.3 Blood Pressure (mmHg): 187/73 Reference Range: 80 - 120 mg / dl Electronic Signature(s) Signed: 01/05/2023 4:35:45 PM By: [name] Entered By: [name] 10:22:12

## 2023-01-05 NOTE — Telephone Encounter (Signed)
Spoke to the patient today and he stated he was seen once by the PT.  He has told them they do not need to return as his back is feeling much better and he does not want to continue with them. He stated Dr. Talmage Nap is his Endo  Called the PT back left a detailed message with the Name of Endo/and that the information they are needing should be received from that MD.

## 2023-01-05 NOTE — Telephone Encounter (Signed)
[name] PT from [facility] called today [phone]). [facility] is needing accurate instructions regarding the patients sliding scale faxed to their office at [phone] ATTN: Darnelle. The Physical Therapist stated medicare requires sliding scales to be very specific and exactly the amounts he is to take per day, not just 45-75 they will not accept as accurate.  Would like corrected medication list faxed to their office at [phone] ATTN: Darnelle.

## 2023-01-06 ENCOUNTER — Telehealth: Payer: Self-pay | Admitting: Pharmacist

## 2023-01-06 NOTE — Telephone Encounter (Signed)
Patient assistance application for Eliquis sent to manufacturer. Of note, patient has not brought in proof of income or out of pocket expenses

## 2023-01-12 ENCOUNTER — Other Ambulatory Visit: Payer: Self-pay | Admitting: Family Medicine

## 2023-01-13 ENCOUNTER — Encounter (HOSPITAL_BASED_OUTPATIENT_CLINIC_OR_DEPARTMENT_OTHER): Payer: Medicare HMO | Admitting: General Surgery

## 2023-01-13 DIAGNOSIS — E1151 Type 2 diabetes mellitus with diabetic peripheral angiopathy without gangrene: Secondary | ICD-10-CM | POA: Diagnosis not present

## 2023-01-13 DIAGNOSIS — I2510 Atherosclerotic heart disease of native coronary artery without angina pectoris: Secondary | ICD-10-CM | POA: Diagnosis not present

## 2023-01-13 DIAGNOSIS — E1165 Type 2 diabetes mellitus with hyperglycemia: Secondary | ICD-10-CM | POA: Diagnosis not present

## 2023-01-13 DIAGNOSIS — E11621 Type 2 diabetes mellitus with foot ulcer: Secondary | ICD-10-CM | POA: Diagnosis not present

## 2023-01-13 DIAGNOSIS — L97512 Non-pressure chronic ulcer of other part of right foot with fat layer exposed: Secondary | ICD-10-CM | POA: Diagnosis not present

## 2023-01-13 DIAGNOSIS — Z7902 Long term (current) use of antithrombotics/antiplatelets: Secondary | ICD-10-CM | POA: Diagnosis not present

## 2023-01-13 DIAGNOSIS — E1140 Type 2 diabetes mellitus with diabetic neuropathy, unspecified: Secondary | ICD-10-CM | POA: Diagnosis not present

## 2023-01-13 DIAGNOSIS — Z7901 Long term (current) use of anticoagulants: Secondary | ICD-10-CM | POA: Diagnosis not present

## 2023-01-13 DIAGNOSIS — I110 Hypertensive heart disease with heart failure: Secondary | ICD-10-CM | POA: Diagnosis not present

## 2023-01-13 DIAGNOSIS — I5032 Chronic diastolic (congestive) heart failure: Secondary | ICD-10-CM | POA: Diagnosis not present

## 2023-01-14 DIAGNOSIS — G4733 Obstructive sleep apnea (adult) (pediatric): Secondary | ICD-10-CM | POA: Diagnosis not present

## 2023-01-14 NOTE — Progress Notes (Signed)
Amrein, Laster [name] ([id number]) [id number] Page 1 of 7 Visit Report for 01/13/2023 Arrival Information Details Patient Name: Date of Service: [name], [name]. 01/13/2023 9:30 [name] Medical Record Number: [id number] Patient Account Number: [id number] Date of Birth/Sex: Treating RN: 05-16-1956 (67 y.o. Yates Decamp Primary Care Zaidin Blyden: Arva Chafe Other Clinician: Referring Atalia Litzinger: Treating Sher Shampine/Extender: [name] in Treatment: 19 Visit Information History Since Last Visit All ordered tests and consults were completed: Yes Patient Arrived: Ambulatory Added or deleted any medications: No Arrival Time: 09:59 Any new allergies or adverse reactions: No Accompanied By: self Had [name] fall or experienced change in No Transfer Assistance: Other activities of daily living that may affect Patient Identification Verified: Yes risk of falls: Secondary Verification Process Completed: Yes Signs or symptoms of abuse/neglect since last visito No Patient Requires Transmission-Based Precautions: No Hospitalized since last visit: No Patient Has Alerts: No Implantable device outside of the clinic excluding No cellular tissue based products placed in the center since last visit: Has Dressing in Place as Prescribed: Yes Pain Present Now: No Electronic Signature(s) Signed: 01/14/2023 12:38:14 PM By: Brenton Grills Entered By: Brenton Grills on 01/13/2023 10:00:19 -------------------------------------------------------------------------------- Encounter Discharge Information Details Patient Name: Date of Service: [name] Pian, [name]. 01/13/2023 9:30 [name] Medical Record Number: [id number] Patient Account Number: [id number] Date of Birth/Sex: Treating RN: 1955/11/30 (67 y.o. Yates Decamp Primary Care Reshard Guillet: Arva Chafe Other Clinician: Referring Travor Royce: Treating Chevette Fee/Extender: [name] in  Treatment: 19 Encounter Discharge Information Items Discharge Condition: Stable Ambulatory Status: Ambulatory Discharge Destination: Home Transportation: Private Auto Accompanied By: self Schedule Follow-up Appointment: Yes Clinical Summary of Care: Patient Declined Electronic Signature(s) Signed: 01/14/2023 12:38:14 PM By: Brenton Grills Entered By: Brenton Grills on 01/13/2023 10:24:40 [name], [name] ([id number]) [id number] Page 2 of 7 -------------------------------------------------------------------------------- Lower Extremity Assessment Details Patient Name: Date of Service: [name], [name]. 01/13/2023 9:30 [name] Medical Record Number: [id number] Patient Account Number: [id number] Date of Birth/Sex: Treating RN: [birth year] (67 y.o. Yates Decamp Primary Care Athen Riel: Arva Chafe Other Clinician: Referring Coley Kulikowski: Treating Rhyder Koegel/Extender: [name] in Treatment: 19 Edema Assessment Assessed: [name]: No] [name]: No] Edema: [Left: Ye] [Right: s] Calf Left: Right: Point of Measurement: From Medial Instep 37.5 cm Ankle Left: Right: Point of Measurement: From Medial Instep 27 cm Vascular Assessment Pulses: Dorsalis Pedis Palpable: [Right:Yes] Electronic Signature(s) Signed: 01/14/2023 12:38:14 PM By: Brenton Grills Entered By: Brenton Grills on 01/13/2023 10:01:07 -------------------------------------------------------------------------------- Multi Wound Chart Details Patient Name: Date of Service: [name] Pian, [name]. 01/13/2023 9:30 [name] Medical Record Number: [id number] Patient Account Number: [id number] Date of Birth/Sex: Treating RN: 07/27/1955 (67 y.o. 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 Etiology: Extremity Sleep Apnea, Arrhythmia, Coronary Comorbid History: Artery Disease, Deep Vein Thrombosis, Hypertension,  Myocardial Infarction, Peripheral Arterial Disease,] [[name]:[name] [name] [name] [name] [name]] [name], [name] ([id number]) [2:Type II Diabetes, Neuropathy 07/08/2022 Date Acquired: 19 Weeks of Treatment: Open Wound Status: No Wound Recurrence: 1x1.1x0.2 Measurements L x W x D (cm) 0.864 [name] : rea 0.173 Volume (cm) : 66.20% % Reduction in [name] rea: 32.20% % Reduction in  Volume: Grade 1 Classification: Medium Exudate [name]  mount: Serosanguineous Exudate Type: red, brown Exudate Color: Distinct, outline attached Wound Margin: Large (67-100%) Granulation [name] mount: Red Granulation Quality: Small (1-33%) Necrotic [name] mount: Fat  Layer (Subcutaneous Tissue): Yes [name] Exposed Structures: Fascia: No Tendon: No Muscle: No Joint: No Bone: No Medium (34-66%) Epithelialization: Callus: Yes Periwound Skin Texture: Maceration: No Periwound Skin Moisture: No Abnormalities Noted Periwound  Skin Color: No Abnormality Temperature:] [[name]:[name] [name] [name] [name] [name] [name] [name] [name] [name] [name] [name] [name] [name] [name] [name] [name] [name] [name] [name] [name] [name] [name]] Treatment Notes Electronic Signature(s) Signed: 01/13/2023 10:08:19 AM By: Duanne Guess MD FACS Entered By: Duanne Guess on 01/13/2023 10:08:19 -------------------------------------------------------------------------------- Multi-Disciplinary Care Plan Details Patient Name: Date of Service: [name] Pian, [name]. 01/13/2023 9:30 [name] Medical Record Number: [id number] Patient Account Number: [id number] Date of Birth/Sex: Treating RN: Aug 18, 1955 (67 y.o. Yates Decamp Primary Care Rylee Huestis: Arva Chafe Other Clinician: Referring Leonel Mccollum: Treating [employer]/Extender:  [name] in Treatment: 19 Multidisciplinary Care Plan reviewed with physician Active Inactive Abuse / Safety / Falls / Self Care Management Nursing Diagnoses: Impaired physical mobility Potential for falls Goals: Patient will not experience any injury related to falls Date Initiated: 08/31/2022 Target Resolution Date: 01/15/2023 Goal Status: Active Patient/caregiver will verbalize/demonstrate measures taken to improve the patient's personal safety Date Initiated: 08/31/2022 Target Resolution Date: 01/15/2023 Goal Status: Active Interventions: Provide education on basic hygiene Provide education on fall prevention Notes: [name], [name] ([id number]) [phone].pdf Page 4 of 7 Wound/Skin Impairment Nursing Diagnoses: Impaired tissue integrity Knowledge deficit related to ulceration/compromised skin integrity Goals: Patient/caregiver will verbalize understanding of skin care regimen Date Initiated: 08/31/2022 Target Resolution Date: 01/15/2023 Goal Status: Active Interventions: Assess patient/caregiver ability to obtain necessary supplies Assess patient/caregiver ability to perform ulcer/skin care regimen upon admission and as needed Assess ulceration(s) every visit Treatment Activities: Skin care regimen initiated : 08/31/2022 Topical wound management initiated : 08/31/2022 Notes: Electronic Signature(s) Signed: 01/14/2023 12:38:14 PM By: Brenton Grills Entered By: Brenton Grills on 01/13/2023 10:02:16 -------------------------------------------------------------------------------- Pain Assessment Details Patient Name: Date of Service: [name] [name]. 01/13/2023 9:30 [name] Medical Record Number: [id number] Patient Account Number: [id number] Date of Birth/Sex: Treating RN: 1955-12-01 (67 y.o. Yates Decamp Primary Care Destyn Schuyler: Arva Chafe Other Clinician: Referring Brailon Don: Treating Dorena Dorfman/Extender: [name] in Treatment: 19 Active Problems Location of Pain Severity and Description of Pain Patient Has Paino No Site Locations Pain Management and Medication Current Pain Management: Electronic Signature(s) Signed: 01/14/2023 12:38:14 PM By: Brenton Grills Entered By: Brenton Grills on 01/13/2023 10:00:55 [name], [name] ([id number]) [id number] Page 5 of 7 -------------------------------------------------------------------------------- Patient/Caregiver Education Details Patient Name: Date of Service: [name], [name] 6/26/2024andnbsp9:30 [name] Medical Record Number: [id number] Patient Account Number: [id number] Date of Birth/Gender: Treating RN: 03/03/1956 (67 y.o. Yates Decamp Primary Care Physician: Arva Chafe Other Clinician: Referring Physician: Treating Physician/Extender: [name] in Treatment: 19 Education Assessment Education Provided To: Patient Education Topics Provided Wound/Skin Impairment: Methods: Explain/Verbal Responses: State content correctly [employer]) Signed: 01/14/2023 12:38:14 PM By: Brenton Grills Entered By: Brenton Grills on 01/13/2023 10:02:35 -------------------------------------------------------------------------------- Wound Assessment Details Patient Name: Date of Service: [name] Pian, [name]. 01/13/2023 9:30 [name] Medical Record Number: [id number] Patient Account Number: [id number] Date of Birth/Sex: Treating RN: February 19, 1956 (67 y.o. Yates Decamp Primary Care Jancarlos Thrun: Arva Chafe Other Clinician: Referring Jetty Berland: Treating Jemuel Laursen/Extender: [name] in Treatment: 19 Wound Status Wound Number: 2 Primary Diabetic Wound/Ulcer of the Lower  Extremity Etiology: Wound Location: Right, Plantar Foot Wound Open Wounding Event: Gradually Appeared Status: Date Acquired: 07/08/2022 Comorbid Sleep Apnea,  Arrhythmia, Coronary Artery Disease, Deep Vein Weeks Of Treatment: 19 History: Thrombosis, Hypertension, Myocardial Infarction, Peripheral Arterial Clustered Wound: No Disease, Type II Diabetes, Neuropathy Wound Measurements Length: (cm) 1 Width: (cm) 1.1 Depth: (cm) 0.2 Area: (cm) 0.864 Volume: (cm) 0.173 % Reduction in Area: 66.2% % Reduction in Volume: 32.2% Epithelialization: Medium (34-66%) Tunneling: No Undermining: No Wound Description Classification: Grade 1 Wound Margin: Distinct, outline attached Exudate Amount: Medium Exudate Type: Serosanguineous Exudate Color: red, brown Foul Odor After Cleansing: No Slough/Fibrino Yes Wound Bed [name], [name] ([id number]) [id number] Page 6 of 7 Granulation Amount: Large (67-100%) Exposed Structure Granulation Quality: Red Fascia Exposed: No Necrotic Amount: Small (1-33%) Fat Layer (Subcutaneous Tissue) Exposed: Yes Necrotic Quality: Adherent Slough Tendon Exposed: No Muscle Exposed: No Joint Exposed: No Bone Exposed: No Periwound Skin Texture Texture Color No Abnormalities Noted: No No Abnormalities Noted: Yes Callus: Yes Temperature / Pain Temperature: No Abnormality Moisture No Abnormalities Noted: No Maceration: No Treatment Notes Wound #2 (Foot) Wound Laterality: Plantar, Right Cleanser Soap and Water Discharge Instruction: May shower and wash wound with dial antibacterial soap and water prior to dressing change. Wound Cleanser Discharge Instruction: Cleanse the wound with wound cleanser prior to applying [name] clean dressing using gauze sponges, not tissue or cotton balls. Peri-Wound Care Zinc Oxide Ointment 30g tube Discharge Instruction: Apply Zinc Oxide to periwound with each dressing change Topical Primary Dressing ADAPTIC TOUCH 3x4.25 (in/in) Discharge Instruction: Apply to wound bed as instructed Secondary Dressing Woven Gauze Sponge, Non-Sterile 4x4 in Discharge Instruction:  Apply over primary dressing as directed. Zetuvit Plus 4x8 in Discharge Instruction: Apply over primary dressing as directed. Secured With [employer], 4.5x3.1 (in/yd) Discharge Instruction: Secure with Kerlix as directed. Compression Wrap Compression Stockings Add-Ons Electronic Signature(s) Signed: 01/14/2023 12:38:14 PM By: Brenton Grills Entered By: Brenton Grills on 01/13/2023 10:01:37 -------------------------------------------------------------------------------- Vitals Details Patient Name: Date of Service: [name] Pian, [name]. 01/13/2023 9:30 [name] Medical Record Number: [id number] Patient Account Number: [id number] Date of Birth/Sex: Treating RN: 10/20/1955 (67 y.o. Yates Decamp Primary Care Jorey Dollard: Arva Chafe Other Clinician: Referring Becci Batty: Treating Chenay Nesmith/Extender: [name] in Treatment: 19 Vital Signs [name], [name] ([id number]) [id number] Page 7 of 7 Time Taken: 10:00 Temperature (F): 98.2 Height (in): 70 Pulse (bpm): 69 Weight (lbs): 260 Respiratory Rate (breaths/min): 18 Body Mass Index (BMI): 37.3 Blood Pressure (mmHg): 187/73 Reference Range: 80 - 120 mg / dl Electronic Signature(s) Signed: 01/14/2023 12:38:14 PM By: Brenton Grills Entered By: Brenton Grills on 01/13/2023 10:00:48

## 2023-01-14 NOTE — Progress Notes (Addendum)
[name], [name] ([id number]) [id number] Page 1 of 10 Visit Report for 01/13/2023 Chief Complaint Document Details Patient Name: Date of Service: [name], [name] [place] [name]. 01/13/2023 9:30 [name] Medical Record Number: [id number] Patient Account Number: [id number] Date of Birth/Sex: Treating RN: [birth year] (67 y.o. M) Primary Care Provider: Arva Chafe Other Clinician: Referring Provider: Treating Provider/Extender: [name] in Treatment: 19 Information Obtained from: Patient Chief Complaint 05/14/2020; patient is here for review of an abrasion injury on the left lateral calf 08/31/2022: [name] (1st metatarsal base, plantar) Electronic Signature(s) Signed: 01/13/2023 10:08:26 AM By: Duanne Guess MD FACS Entered By: Duanne Guess on 01/13/2023 10:08:26 -------------------------------------------------------------------------------- HPI Details Patient Name: Date of Service: [name], [name] [name]. 01/13/2023 9:30 [name] Medical Record Number: [id number] Patient Account Number: [id number] Date of Birth/Sex: Treating RN: [birth year] (67 y.o. M) Primary Care Provider: Arva Chafe Other Clinician: Referring Provider: Treating Provider/Extender: [name] in Treatment: 19 History of Present Illness HPI Description: ADMISSION 05/14/2020; this is [name] with multiple medical issues. Predominantly he has type 2 diabetes with [name] and also history of fairly significant PAD. He had [name] in February 2017 he also had an atherectomy and angioplasty by Dr. Allyson [name] of the right popliteal artery in 2016. He is supposed to be getting arterial studies annually however this was interrupted last year because of the pandemic. He tells [place] he was at [facility] 2 weeks ago was getting out of of the scooter and traumatized his left  lateral lower leg. There was [name] as the patient is on Plavix and Eliquis. They have been dressing this with Neosporin and doing [name] fairly good job. Wound measures 2.5 x 3.5 it does not have any depth he does not have [name] in his legs outside of surgery however he does have chronic edema and skin changes suggestive of chronic venous disease possibly some degree of lymphedema as well. Past medical history includes type 2 diabetes with peripheral neuropathy and gait instability, lumbar spondylosis, obstructive sleep apnea, history of [name], basal cell skin cancer, atrial fibrillation on anticoagulation, significant PAD as noted with [name] and [name] by Dr. Allyson [name] in 30th 2016. He also has [name] of coronary artery disease with an MI in 2002 hypertension hyperlipidemia and heart failure with preserved ejection fraction His last arterial studies [name] can see in epic were on 03/10/2018 this showed [name] and [name] with monophasic waveforms on the right. On the left his ABI was 1.20 with [name] and triphasic waveforms. He has not had arterial studies since. Our nurse in the clinic got an ABI on the left of 1.1 11/2; left anterior leg wound in the setting of chronic venous insufficiency. Wound was initially trauma. We have been using Hydrofera Blue under compression he has home health.. The wound looks [name] lot better today with improvement in surface area 11/16; left anterior leg wound in the setting of chronic venous insufficiency. Wound was initially trauma. We have been using Hydrofera Blue under compression. The patient is closed today. He is supposed to follow-up with vein and vascular with regards to arterial insufficiency nevertheless his leg wounds are 12/3; apparently 2 weeks ago when they were putting on their stockings they managed  to get 3 wounds on the left anterior lower leg from abrasion when putting on the stockings. Home health came by the week of Thanksgiving and put Hydrofera Blue 4-layer wrap on this and there is only one superficial area remaining. The patient and his wife complained about the difficulties getting stockings on [name] think we are using 20/30. We will order bilateral external compression stockings which should be easier. 12/10; wound on the left anterior lower leg is closed. He has chronic venous insufficiency we ordered him Farrow wrap stockings unfortunately he did not bring [name], [name] ([id number]) [phone].pdf Page 2 of 10 these in. 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 He says that it has been present since mid December. He is currently residing in [facility] until March and [name] podiatrist has been looking after him there. They have been simply painting the area with Betadine. He has not had any lower extremity arterial studies since 2019, at which time his right ABI was 0.69. Measured in clinic today, it was 0.71. He is not aware of his most recent hemoglobin A1c, but historically he has had exceptionally poor control. On the basis of his right first metatarsal, there is [name] small crescent shaped wound. There is surrounding eschar and callus. There is no malodor or purulent drainage. 09/08/2022: The original wound is smaller today and fairly clean, but there is some discoloration and [name] pulpy texture to the adjacent callus. Underneath this, the tissue is open exposing the fat layer. It looks like perhaps there was [name] crack in the callus and moisture got under the skin and caused breakdown. 09/15/2022: There has been more moisture related tissue breakdown. The callus is very soft and the underlying tissue is more open. 09/28/2022: The wound looks much better. He has done [name] good job keeping it dry. There is  some callus overlying much of the wound surface. There is slough on the exposed open areas. 10/14/2022: There has been [name] lot of moisture related tissue breakdown. He is still forming callus over the top and then it seems that moisture gets underneath the callus and causes tissue damage. There is slough on the exposed wound surface. They will be moving back to the [place] area [place] the [place] this [place]. 10/21/2022: His foot is less wet, but there is no significant change to his wound. He has developed [name] blister on his left anterior tibial surface. There is no open wound here, but he does have some fairly significant edema. We are planning to apply [name] total contact cast today. 10/23/2022: Here for his obligatory first cast change. He says he has not had any issues wearing the cast or walking in the boot. No detrimental effects on his wound. 10/29/2022: The wound is looking better. Clearly, putting him in the cast has prevented water from getting in under his callus and causing further tissue breakdown. The blister on his left anterior tibial surface has not yet ruptured. Edema control is significantly improved. 11/05/2022: The wound on his right plantar foot is getting better. There is still some callus accumulation around the wounds, but there are just 2 open areas now, [name] very small 1 on the medial aspect of his metatarsal head and [name] little bit larger 1 on the plantar surface. The blister on his left anterior tibial surface is now open with hanging dry skin. 11/12/2022: The left anterior tibial wound is closed. The wound on his right plantar  foot is smaller with just [name] little bit of callus around the edges. 11/19/2022: The right plantar foot wound continues to contract. The surface is quite clean. The tiny satellite area on the medial aspect of his foot has closed. 11/26/2022: The wound is smaller again today. He is responding well to the offloading effects of total contact casting. 12/03/2022: The wound is about  the same size today. There is [name] little bit of moisture around the perimeter of the wound. No significant tissue breakdown. 12/10/2022: The wound is smaller today. There has been no moisture-related tissue breakdown of any maceration. 12/21/2022: The wound continues to contract. Moisture control is excellent. 12/28/2022: The wound is [name] little bit smaller today. 01/05/2023: The wound measures smaller today. Excellent moisture control with zero evidence of maceration. The wound surface is clean with healthy granulation tissue. 01/13/2023: We are leaving his Apligraf in situ for [name] second week. There is no discolored drainage or malodor coming from the site. Electronic Signature(s) Signed: 01/13/2023 10:08:53 AM By: Duanne Guess MD FACS Entered By: Duanne Guess on 01/13/2023 10:08:53 -------------------------------------------------------------------------------- Physical Exam Details Patient Name: Date of Service: [name], [name] [name]. 01/13/2023 9:30 [name] Medical Record Number: [id number] Patient Account Number: [id number] Date of Birth/Sex: Treating RN: August 15, 1955 (67 y.o. M) Primary Care Provider: Arva Chafe Other Clinician: Referring Provider: Treating Provider/Extender: [name] in Treatment: 19 Constitutional Hypertensive, asymptomatic. . . . no acute distress. Respiratory Normal work of breathing on room air. [name], [name] ([id number]) [id number] Page 3 of 10 Notes 01/13/2023: The Apligraf was left in place and so the wound was not inspected. There is no concerning drainage or malodor. Electronic Signature(s) Signed: 01/13/2023 10:09:36 AM By: Duanne Guess MD FACS Entered By: Duanne Guess on 01/13/2023 10:09:35 -------------------------------------------------------------------------------- Physician Orders Details Patient Name: Date of Service: [name], [name] [name]. 01/13/2023 9:30 [name] Medical Record Number:  [id number] Patient Account Number: [id number] Date of Birth/Sex: Treating RN: 13-Jan-1956 (67 y.o. Yates Decamp Primary Care Provider: Arva Chafe Other Clinician: Referring Provider: Treating Provider/Extender: [name] in Treatment: 19 Verbal / Phone Orders: No Diagnosis Coding ICD-10 Coding Code Description [phone] Non-pressure chronic ulcer of other part of right foot with fat layer exposed I73.9 Peripheral vascular disease, unspecified I25.10 Atherosclerotic heart disease of native coronary artery without angina pectoris I10 Essential (primary) hypertension E11.65 Type 2 diabetes mellitus with hyperglycemia E11.40 Type 2 diabetes mellitus with diabetic neuropathy, unspecified I63.9 Cerebral infarction, unspecified Follow-up Appointments ppointment in 1 week. - Dr. Lady Gary - room 2 Return [name] Anesthetic (In clinic) Topical Lidocaine 5% applied to wound bed Cellular or Tissue Based Products Wound #2 Right,Plantar Foot Cellular or Tissue Based Product Type: - Apligraf #5 Cellular or Tissue Based Product applied to wound bed, secured with steri-strips, cover with Adaptic or Mepitel. (DO NOT REMOVE). Bathing/ Shower/ Hygiene May shower with protection but do not get wound dressing(s) wet. Protect dressing(s) with water repellant cover (for example, large plastic bag) or [name] cast cover and may then take shower. Edema Control - Lymphedema / SCD / Other Elevate legs to the level of the heart or above for 30 minutes daily and/or when sitting for 3-4 times [name] day throughout the day. Avoid standing for long periods of time. Patient to wear own compression stockings every day. Moisturize legs daily. Off-Loading Removable cast walker boot to: - right leg Wound Treatment Wound #2 - Foot Wound Laterality: Plantar, Right Cleanser: Soap and Water 1 x Per Week/30 Days  Discharge Instructions: May shower and wash wound with dial antibacterial soap and  water prior to dressing change. Cleanser: Wound Cleanser 1 x Per Week/30 Days Discharge Instructions: Cleanse the wound with wound cleanser prior to applying [name] clean dressing using gauze sponges, not tissue or cotton balls. Peri-Wound Care: Zinc Oxide Ointment 30g tube 1 x Per Week/30 Days Discharge Instructions: Apply Zinc Oxide to periwound with each dressing change Prim Dressing: ADAPTIC TOUCH 3x4.25 (in/in) 1 x Per Week/30 Days ary Discharge Instructions: Apply to wound bed as instructed [name], [name] ([id number]) [id number] Page 4 of 10 Secondary Dressing: Woven Gauze Sponge, Non-Sterile 4x4 in 1 x Per Week/30 Days Discharge Instructions: Apply over primary dressing as directed. Secondary Dressing: Zetuvit Plus 4x8 in 1 x Per Week/30 Days Discharge Instructions: Apply over primary dressing as directed. Secured With: [employer], 4.5x3.1 (in/yd) 1 x Per Week/30 Days Discharge Instructions: Secure with Kerlix as directed. Electronic Signature(s) Signed: 01/13/2023 10:27:40 AM By: Duanne Guess MD FACS Signed: 01/14/2023 12:38:14 PM By: Brenton Grills Entered By: Brenton Grills on 01/13/2023 10:21:42 -------------------------------------------------------------------------------- Problem List Details Patient Name: Date of Service: [name], [name] [name]. 01/13/2023 9:30 [name] Medical Record Number: [id number] Patient Account Number: [id number] Date of Birth/Sex: Treating RN: 1955/10/06 (67 y.o. Yates Decamp Primary Care Provider: Arva Chafe Other Clinician: Referring Provider: Treating Provider/Extender: [name] in Treatment: 19 Active Problems ICD-10 Encounter Code Description Active Date MDM Diagnosis [phone] Non-pressure chronic ulcer of other part of right foot with fat layer exposed 08/31/2022 No Yes I73.9 Peripheral vascular disease, unspecified 08/31/2022 No Yes I25.10 Atherosclerotic heart disease of  native coronary artery without angina pectoris 08/31/2022 No Yes I10 Essential (primary) hypertension 08/31/2022 No Yes E11.65 Type 2 diabetes mellitus with hyperglycemia 08/31/2022 No Yes E11.40 Type 2 diabetes mellitus with diabetic neuropathy, unspecified 08/31/2022 No Yes I63.9 Cerebral infarction, unspecified 08/31/2022 No Yes Inactive Problems Resolved Problems ICD-10 Code Description Active Date Resolved Date L97.821 Non-pressure chronic ulcer of other part of left lower leg limited to breakdown of skin 11/05/2022 11/05/2022 [name], [name] ([id number]) [phone].pdf Page 5 of 10 Electronic Signature(s) Signed: 01/13/2023 10:07:08 AM By: Duanne Guess MD FACS Entered By: Duanne Guess on 01/13/2023 10:07:08 -------------------------------------------------------------------------------- Progress Note Details Patient Name: Date of Service: [name], [name] [name]. 01/13/2023 9:30 [name] Medical Record Number: [id number] Patient Account Number: [id number] Date of Birth/Sex: Treating RN: 1955-08-01 (67 y.o. M) Primary Care Provider: Arva Chafe Other Clinician: Referring Provider: Treating Provider/Extender: [name] in Treatment: 19 Subjective Chief Complaint Information obtained from Patient 05/14/2020; patient is here for review of an abrasion injury on the left lateral calf 08/31/2022: [name] (1st metatarsal base, plantar) History of Present Illness (HPI) ADMISSION 05/14/2020; this is [name] with multiple medical issues. Predominantly he has type 2 diabetes with [name] and also history of fairly significant PAD. He had [name] in February 2017 he also had an atherectomy and angioplasty by Dr. Allyson [name] of the right popliteal artery in 2016. He is supposed to be getting arterial studies annually however this was interrupted last  year because of the pandemic. He tells [place] he was at [facility] 2 weeks ago was getting out of of the scooter and traumatized his left lateral lower leg. There was [name] as the patient is on Plavix and Eliquis. They have been dressing this with Neosporin and doing [name] fairly good  job. Wound measures 2.5 x 3.5 it does not have any depth he does not have [name] in his legs outside of surgery however he does have chronic edema and skin changes suggestive of chronic venous disease possibly some degree of lymphedema as well. Past medical history includes type 2 diabetes with peripheral neuropathy and gait instability, lumbar spondylosis, obstructive sleep apnea, history of [name], basal cell skin cancer, atrial fibrillation on anticoagulation, significant PAD as noted with [name] and [name] by Dr. Allyson [name] in 30th 2016. He also has [name] of coronary artery disease with an MI in 2002 hypertension hyperlipidemia and heart failure with preserved ejection fraction His last arterial studies [name] can see in epic were on 03/10/2018 this showed [name] and [name] with monophasic waveforms on the right. On the left his ABI was 1.20 with [name] and triphasic waveforms. He has not had arterial studies since. Our nurse in the clinic got an ABI on the left of 1.1 11/2; left anterior leg wound in the setting of chronic venous insufficiency. Wound was initially trauma. We have been using Hydrofera Blue under compression he has home health.. The wound looks [name] lot better today with improvement in surface area 11/16; left anterior leg wound in the setting of chronic venous insufficiency. Wound was initially trauma. We have been using Hydrofera Blue under compression. The patient is closed today. He is supposed to follow-up with vein and vascular with regards to  arterial insufficiency nevertheless his leg wounds are 12/3; apparently 2 weeks ago when they were putting on their stockings they managed to get 3 wounds on the left anterior lower leg from abrasion when putting on the stockings. Home health came by the week of Thanksgiving and put Hydrofera Blue 4-layer wrap on this and there is only one superficial area remaining. The patient and his wife complained about the difficulties getting stockings on [name] think we are using 20/30. We will order bilateral external compression stockings which should be easier. 12/10; wound on the left anterior lower leg is closed. He has chronic venous insufficiency we ordered him Farrow wrap stockings unfortunately he did not bring these in. 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 He says that it has been present since mid December. He is currently residing in [facility] until March and [name] podiatrist has been looking after him there. They have been simply painting the area with Betadine. He has not had any lower extremity arterial studies since 2019, at which time his right ABI was 0.69. Measured in clinic today, it was 0.71. He is not aware of his most recent hemoglobin A1c, but historically he has had exceptionally poor control. On the basis of his right first metatarsal, there is [name] small crescent shaped wound. There is surrounding eschar and callus. There is no malodor or purulent drainage. 09/08/2022: The original wound is smaller today and fairly clean, but there is some discoloration and [name] pulpy texture to the adjacent callus. Underneath this, the tissue is open exposing the fat layer. It looks like perhaps there was [name] crack in the callus and moisture got under the skin and caused breakdown. 09/15/2022: There has been more moisture related tissue breakdown. The callus is very soft and the underlying tissue is more open. 09/28/2022:  The wound looks  much better. He has done [name] good job keeping it dry. There is some callus overlying much of the wound surface. There is slough on the exposed open areas. 10/14/2022: There has been [name] lot of moisture related tissue breakdown. He is still forming callus over the top and then it seems that moisture gets underneath the callus and causes tissue damage. There is slough on the exposed wound surface. They will be moving back to the [place] the [place] this weekend. [name], [name] ([id number]) [id number] Page 6 of 10 10/21/2022: His foot is less wet, but there is no significant change to his wound. He has developed [name] blister on his left anterior tibial surface. There is no open wound here, but he does have some fairly significant edema. We are planning to apply [name] total contact cast today. 10/23/2022: Here for his obligatory first cast change. He says he has not had any issues wearing the cast or walking in the boot. No detrimental effects on his wound. 10/29/2022: The wound is looking better. Clearly, putting him in the cast has prevented water from getting in under his callus and causing further tissue breakdown. The blister on his left anterior tibial surface has not yet ruptured. Edema control is significantly improved. 11/05/2022: The wound on his right plantar foot is getting better. There is still some callus accumulation around the wounds, but there are just 2 open areas now, [name] very small 1 on the medial aspect of his metatarsal head and [name] little bit larger 1 on the plantar surface. The blister on his left anterior tibial surface is now open with hanging dry skin. 11/12/2022: The left anterior tibial wound is closed. The wound on his right plantar foot is smaller with just [name] little bit of callus around the edges. 11/19/2022: The right plantar foot wound continues to contract. The surface is quite clean. The tiny satellite area on the medial aspect of his foot has closed. 11/26/2022:  The wound is smaller again today. He is responding well to the offloading effects of total contact casting. 12/03/2022: The wound is about the same size today. There is [name] little bit of moisture around the perimeter of the wound. No significant tissue breakdown. 12/10/2022: The wound is smaller today. There has been no moisture-related tissue breakdown of any maceration. 12/21/2022: The wound continues to contract. Moisture control is excellent. 12/28/2022: The wound is [name] little bit smaller today. 01/05/2023: The wound measures smaller today. Excellent moisture control with zero evidence of maceration. The wound surface is clean with healthy granulation tissue. 01/13/2023: We are leaving his Apligraf in situ for [name] second week. There is no discolored drainage or malodor coming from the site. Patient History Information obtained from Patient. Family History Unknown History. Social History Never smoker, Marital Status - Single, Alcohol Use - Never, Drug Use - No History, Caffeine Use - Rarely. Medical History Respiratory Patient has history of Sleep Apnea Cardiovascular Patient has history of Arrhythmia - [name]-Fib, Coronary Artery Disease - s/p CABG, Deep Vein Thrombosis, Hypertension, Myocardial Infarction, Peripheral Arterial Disease - s/p Fempop x 2 Endocrine Patient has history of Type II Diabetes Neurologic Patient has history of Neuropathy Hospitalization/Surgery History - colonoscopy. - polypectomy. - peripheral vascular cath. - shoulder arthroscopy. - carpal tunnel release. - coronary artery bypass. - appendectomy. - cardiac cath. - coronary angioplasty. - thrombectomy. - knee arthroplasty. - popliteal artery stent. Medical [name] Surgical History Notes nd Cardiovascular CVA x 3 Musculoskeletal carpal tunnel syndrome  Neurologic stroke Oncologic skin cancer Objective Constitutional Hypertensive, asymptomatic. no acute distress. Vitals Time Taken: 10:00 AM, Height: 70 in, Weight: 260 lbs,  BMI: 37.3, Temperature: 98.2 F, Pulse: 69 bpm, Respiratory Rate: 18 breaths/min, Blood Pressure: 187/73 mmHg. Respiratory Normal work of breathing on room air. General Notes: 01/13/2023: The Apligraf was left in place and so the wound was not inspected. There is no concerning drainage or malodor. Integumentary (Hair, Skin) [name], [name] ([id number]) [phone].pdf Page 7 of 10 Wound #2 status is Open. Original cause of wound was Gradually Appeared. The date acquired was: 07/08/2022. The wound has been in treatment 19 weeks. The wound is located on the Right,Plantar Foot. The wound measures 1cm length x 1.1cm width x 0.2cm depth; 0.864cm^2 area and 0.173cm^3 volume. There is Fat Layer (Subcutaneous Tissue) exposed. There is no tunneling or undermining noted. There is [name] medium amount of serosanguineous drainage noted. The wound margin is distinct with the outline attached to the wound base. There is large (67-100%) red granulation within the wound bed. There is [name] small (1-33%) amount of necrotic tissue within the wound bed including Adherent Slough. The periwound skin appearance had no abnormalities noted for color. The periwound skin appearance exhibited: Callus. The periwound skin appearance did not exhibit: Maceration. Periwound temperature was noted as No Abnormality. Assessment Active Problems ICD-10 Non-pressure chronic ulcer of other part of right foot with fat layer exposed Peripheral vascular disease, unspecified Atherosclerotic heart disease of native coronary artery without angina pectoris Essential (primary) hypertension Type 2 diabetes mellitus with hyperglycemia Type 2 diabetes mellitus with diabetic neuropathy, unspecified Cerebral infarction, unspecified Procedures Wound #2 Pre-procedure diagnosis of Wound #2 is [name] Diabetic Wound/Ulcer of the Lower Extremity located on the Right,Plantar Foot . There was [name] T Contact Cast otal Procedure by Duanne Guess,  MD. Post procedure Diagnosis Wound #2: Same as Pre-Procedure Notes: Scribed for Dr Lady Gary by Brenton Grills RN.[name] Plan Follow-up Appointments: Return Appointment in 1 week. - Dr. Lady Gary - room 2 Anesthetic: (In clinic) Topical Lidocaine 5% applied to wound bed Cellular or Tissue Based Products: Wound #2 Right,Plantar Foot: Cellular or Tissue Based Product Type: - Apligraf #5 Cellular or Tissue Based Product applied to wound bed, secured with steri-strips, cover with Adaptic or Mepitel. (DO NOT REMOVE). Bathing/ Shower/ Hygiene: May shower with protection but do not get wound dressing(s) wet. Protect dressing(s) with water repellant cover (for example, large plastic bag) or [name] cast cover and may then take shower. Edema Control - Lymphedema / SCD / Other: Elevate legs to the level of the heart or above for 30 minutes daily and/or when sitting for 3-4 times [name] day throughout the day. Avoid standing for long periods of time. Patient to wear own compression stockings every day. Moisturize legs daily. Off-Loading: Removable cast walker boot to: - right leg WOUND #2: - Foot Wound Laterality: Plantar, Right Cleanser: Soap and Water 1 x Per Week/30 Days Discharge Instructions: May shower and wash wound with dial antibacterial soap and water prior to dressing change. Cleanser: Wound Cleanser 1 x Per Week/30 Days Discharge Instructions: Cleanse the wound with wound cleanser prior to applying [name] clean dressing using gauze sponges, not tissue or cotton balls. Peri-Wound Care: Zinc Oxide Ointment 30g tube 1 x Per Week/30 Days Discharge Instructions: Apply Zinc Oxide to periwound with each dressing change Prim Dressing: ADAPTIC TOUCH 3x4.25 (in/in) 1 x Per Week/30 Days ary Discharge Instructions: Apply to wound bed as instructed Secondary Dressing: Woven Gauze Sponge, Non-Sterile 4x4 in 1  x Per Week/30 Days Discharge Instructions: Apply over primary dressing as directed. Secondary Dressing: Zetuvit  Plus 4x8 in 1 x Per Week/30 Days Discharge Instructions: Apply over primary dressing as directed. Secured With: [employer], 4.5x3.1 (in/yd) 1 x Per Week/30 Days Discharge Instructions: Secure with Kerlix as directed. 01/13/2023: The Apligraf was left in place and so the wound was not inspected. There is no concerning drainage or malodor. [name] total contact cast was applied. Next week, we will remove the Apligraf and assess the wound in depth. We may look at another skin substitute option for him at that time. ANUP, [name] ([id number]) [id number] Page 8 of 10 Electronic Signature(s) Signed: 01/15/2023 2:14:44 PM By: Shawn Stall RN, BSN Signed: 01/15/2023 2:47:25 PM By: Duanne Guess MD FACS Previous Signature: 01/13/2023 10:10:41 AM Version By: Duanne Guess MD FACS Entered By: Shawn Stall on 01/15/2023 14:12:22 -------------------------------------------------------------------------------- HxROS Details Patient Name: Date of Service: [name], [name] [name]. 01/13/2023 9:30 [name] Medical Record Number: [id number] Patient Account Number: [id number] Date of Birth/Sex: Treating RN: 27-Sep-1955 (67 y.o. M) Primary Care Provider: Arva Chafe Other Clinician: Referring Provider: Treating Provider/Extender: [name] in Treatment: 19 Information Obtained From Patient Respiratory Medical History: Positive for: Sleep Apnea Cardiovascular Medical History: Positive for: Arrhythmia - [name]-Fib; Coronary Artery Disease - s/p CABG; Deep Vein Thrombosis; Hypertension; Myocardial Infarction; Peripheral Arterial Disease - s/p Fempop x 2 Past Medical History Notes: CVA x 3 Endocrine Medical History: Positive for: Type II Diabetes Time with diabetes: since 1997 Treated with: Insulin, Oral agents Blood sugar tested every day: Yes Tested : 2-3x per day Musculoskeletal Medical History: Past Medical History Notes: carpal tunnel  syndrome Neurologic Medical History: Positive for: Neuropathy Past Medical History Notes: stroke Oncologic Medical History: Past Medical History Notes: skin cancer Immunizations Pneumococcal Vaccine: Received Pneumococcal Vaccination: Yes Received Pneumococcal Vaccination On or After 60th Birthday: Yes Implantable Devices None Hospitalization / Surgery History Type of Hospitalization/Surgery [name], [name] ([id number]) [id number] Page 9 of 10 colonoscopy polypectomy peripheral vascular cath shoulder arthroscopy carpal tunnel release coronary artery bypass appendectomy cardiac cath coronary angioplasty thrombectomy knee arthroplasty popliteal artery stent Family and Social History Unknown History: Yes; Never smoker; Marital Status - Single; Alcohol Use: Never; Drug Use: No History; Caffeine Use: Rarely; Financial Concerns: No; Food, Clothing or Shelter Needs: No; Support System Lacking: No; Transportation Concerns: No Electronic Signature(s) Signed: 01/13/2023 10:27:40 AM By: Duanne Guess MD FACS Entered By: Duanne Guess on 01/13/2023 10:08:59 -------------------------------------------------------------------------------- Total Contact Cast Details Patient Name: Date of Service: [name]. 01/13/2023 9:30 [name] Medical Record Number: [id number] Patient Account Number: [id number] Date of Birth/Sex: Treating RN: [birth year] (66 y.o. Yates Decamp Primary Care Provider: Arva Chafe Other Clinician: Referring Provider: Treating Provider/Extender: [name] in Treatment: 19 T Contact Cast Applied for Wound Assessment: otal Wound #2 Right,Plantar Foot Performed By: Physician Duanne Guess, MD Post Procedure Diagnosis Same as Pre-procedure Notes Scribed for Dr Lady Gary by Brenton Grills RN. Electronic Signature(s) Signed: 01/13/2023 10:27:40 AM By: Duanne Guess MD FACS Signed: 01/14/2023  12:38:14 PM By: Brenton Grills Entered By: Brenton Grills on 01/13/2023 10:23:46 -------------------------------------------------------------------------------- SuperBill Details Patient Name: Date of Service: [name]. 01/13/2023 Medical Record Number: [id number] Patient Account Number: [id number] Date of Birth/Sex: Treating RN: 05/31/1956 (67 y.o. M) Primary Care Provider: Arva Chafe Other Clinician: Referring Provider: Treating Provider/Extender: [name] in Treatment: 19 Diagnosis Coding ICD-10 Codes Code Description [phone] Non-pressure chronic ulcer of  other part of right foot with fat layer exposed [name], [name] ([id number]) [phone].pdf Page 10 of 10 I73.9 Peripheral vascular disease, unspecified I25.10 Atherosclerotic heart disease of native coronary artery without angina pectoris I10 Essential (primary) hypertension E11.65 Type 2 diabetes mellitus with hyperglycemia E11.40 Type 2 diabetes mellitus with diabetic neuropathy, unspecified I63.9 Cerebral infarction, unspecified Facility Procedures : CPT4 Code: [id number] Description: 10272 - APPLY TOTAL CONTACT LEG CAST ICD-10 Diagnosis Description L97.512 Non-pressure chronic ulcer of other part of right foot with fat layer exposed Modifier: Quantity: 1 Physician Procedures : CPT4 Code Description Modifier [id number] - WC PHYS LEVEL 3 - EST PT ICD-10 Diagnosis Description L97.512 Non-pressure chronic ulcer of other part of right foot with fat layer exposed I73.9 Peripheral vascular disease, unspecified E11.65 Type 2  diabetes mellitus with hyperglycemia E11.40 Type 2 diabetes mellitus with diabetic neuropathy, unspecified Quantity: 1 : [id number] - WC PHYS APPLY TOTAL CONTACT CAST ICD-10 Diagnosis Description L97.512 Non-pressure chronic ulcer of other part of right foot with fat layer exposed Quantity: 1 Electronic Signature(s) Signed: 01/13/2023  10:11:06 AM By: Duanne Guess MD FACS Entered By: Duanne Guess on 01/13/2023 10:11:06

## 2023-01-18 NOTE — Progress Notes (Signed)
[name], [name] ([id number]) [id number] Page 1 of 11 Visit Report for 12/10/2022 Chief Complaint Document Details Patient Name: Date of Service: [name], [name]. 12/10/2022 10:30 [name] Medical Record Number: [id number] Patient Account Number: [id number] Date of Birth/Sex: Treating RN: [birth year] (67 y.o. M) Primary Care Provider: Arva Chafe Other Clinician: Referring Provider: Treating Provider/Extender: [name] in Treatment: 14 Information Obtained from: Patient Chief Complaint 05/14/2020; patient is here for review of an abrasion injury on the left lateral calf 08/31/2022: [name] (1st metatarsal base, plantar) Electronic Signature(s) Signed: 12/10/2022 11:25:50 AM By: Duanne Guess MD FACS Entered By: Duanne Guess on 12/10/2022 11:25:50 -------------------------------------------------------------------------------- Cellular or Tissue Based Product Details Patient Name: Date of Service: [place], [name]. 12/10/2022 10:30 [name] Medical Record Number: [id number] Patient Account Number: [id number] Date of Birth/Sex: Treating RN: April 22, 1956 (67 y.o. [name] Primary Care Provider: Arva Chafe Other Clinician: Referring Provider: Treating Provider/Extender: [name] in Treatment: 14 Cellular or Tissue Based Product Type Wound #2 Right,Plantar Foot Applied to: Performed By: Physician Duanne Guess, MD Cellular or Tissue Based Product Type: Apligraf Level of Consciousness (Pre-procedure): Awake and Alert Pre-procedure Verification/Time Out Yes - 11:10 Taken: Location: trunk / arms / legs Wound Size (sq cm): 2.1 Product Size (sq cm): 44 Waste Size (sq cm): 33 Waste Reason: too big Amount of Product Applied (sq cm): 11 Instrument Used: Forceps, Scissors Lot #: [id number] [name] Order #: 41 Expiration Date: 12/18/2022 Fenestrated: No Reconstituted: Yes Solution  Type: NS Solution Amount: 5ml Lot #: [id number] Solution Expiration Date: 12/17/2024 Secured: Yes Secured With: Steri-Strips Dressing Applied: Yes Primary Dressing: Adaptic Procedural Pain: 0 Lacewell, Rondle [name] ([id number]) [phone].pdf Page 2 of 11 Post Procedural Pain: 0 Response to Treatment: Procedure was tolerated well Level of Consciousness (Post- Awake and Alert procedure): Post Procedure Diagnosis Same as Pre-procedure Notes scribed for Dr. Lady Gary by Brenton Grills, RN Electronic Signature(s) Signed: 12/10/2022 12:15:16 PM By: Duanne Guess MD FACS Signed: 12/16/2022 10:30:00 AM By: Brenton Grills Entered By: Brenton Grills on 12/10/2022 11:14:58 -------------------------------------------------------------------------------- HPI Details Patient Name: Date of Service: [name] Pian, [name]. 12/10/2022 10:30 [name] Medical Record Number: [id number] Patient Account Number: [id number] Date of Birth/Sex: Treating RN: 1956/04/16 (67 y.o. M) Primary Care Provider: Arva Chafe Other Clinician: Referring Provider: Treating Provider/Extender: [name] in Treatment: 14 History of Present Illness HPI Description: ADMISSION 05/14/2020; this is [name] with multiple medical issues. Predominantly he has type 2 diabetes with [name] and also history of fairly significant PAD. He had [name] in February 2017 he also had an atherectomy and angioplasty by Dr. [name] of the right popliteal artery in 2016. He is supposed to be getting arterial studies annually however this was interrupted last year because of the pandemic. He tells [place] he was at [facility] 2 weeks ago was getting out of of the scooter and traumatized his left lateral lower leg. There was [name] as the patient is on Plavix and Eliquis. They have been dressing this with Neosporin and  doing [name] fairly good job. Wound measures 2.5 x 3.5 it does not have any depth he does not have [name] in his legs outside of surgery however he does have chronic edema and skin changes suggestive of chronic venous disease possibly some degree of lymphedema as well. Past medical history includes type 2 diabetes with peripheral neuropathy  and gait instability, lumbar spondylosis, obstructive sleep apnea, history of [name], basal cell skin cancer, atrial fibrillation on anticoagulation, significant PAD as noted with [name] and [name] in 30th 2016. He also has [name] of coronary artery disease with an MI in 2002 hypertension hyperlipidemia and heart failure with preserved ejection fraction His last arterial studies I can see in epic were on 03/10/2018 this showed [name] and [name] with monophasic waveforms on the right. On the left his ABI was 1.20 with [name] and triphasic waveforms. He has not had arterial studies since. Our nurse in the clinic got an ABI on the left of 1.1 11/2; left anterior leg wound in the setting of chronic venous insufficiency. Wound was initially trauma. We have been using Hydrofera Blue under compression he has home health.. The wound looks [name] lot better today with improvement in surface area 11/16; left anterior leg wound in the setting of chronic venous insufficiency. Wound was initially trauma. We have been using Hydrofera Blue under compression. The patient is closed today. He is supposed to follow-up with vein and vascular with regards to arterial insufficiency nevertheless his leg wounds are 12/3; apparently 2 weeks ago when they were putting on their stockings they managed to get 3 wounds on the left anterior lower leg from abrasion when putting on the stockings. Home health came by the week of  Thanksgiving and put Hydrofera Blue 4-layer wrap on this and there is only one superficial area remaining. The patient and his wife complained about the difficulties getting stockings on I think we are using 20/30. We will order bilateral external compression stockings which should be easier. 12/10; wound on the left anterior lower leg is closed. He has chronic venous insufficiency we ordered him Farrow wrap stockings unfortunately he did not bring these in. 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 He says that it has been present since mid December. He is currently residing in [facility] until March and [name] podiatrist has been looking after him there. They have been simply painting the area with Betadine. He has not had any lower extremity arterial studies since 2019, at which time his right ABI was 0.69. Measured in clinic today, it was 0.71. He is not aware of his most recent hemoglobin A1c, but historically he has had exceptionally poor control. On the basis of his right first metatarsal, there is [name] small crescent shaped wound. There is surrounding eschar and callus. There is no malodor or purulent drainage. 09/08/2022: The original wound is smaller today and fairly clean, but there is some discoloration and [name] pulpy texture to the adjacent callus. Underneath this, the tissue is open exposing the fat layer. It looks like perhaps there was [name] crack in the callus and moisture got under the skin and caused breakdown. 09/15/2022: There has been more moisture related tissue breakdown. The callus is very soft and the underlying tissue is more open. DESHAUN, TRAMELL [name] ([id number]) [id number] Page 3 of 11 09/28/2022: The wound looks much better. He has done [name] good job keeping it dry. There is some callus overlying much of the wound surface. There is slough on the exposed open areas. 10/14/2022: There has been [name] lot of  moisture related tissue breakdown. He is still  forming callus over the top and then it seems that moisture gets underneath the callus and causes tissue damage. There is slough on the exposed wound surface. They will be moving back to the local area from the [place] this weekend. 10/21/2022: His foot is less wet, but there is no significant change to his wound. He has developed [name] blister on his left anterior tibial surface. There is no open wound here, but he does have some fairly significant edema. We are planning to apply [name] total contact cast today. 10/23/2022: Here for his obligatory first cast change. He says he has not had any issues wearing the cast or walking in the boot. No detrimental effects on his wound. 10/29/2022: The wound is looking better. Clearly, putting him in the cast has prevented water from getting in under his callus and causing further tissue breakdown. The blister on his left anterior tibial surface has not yet ruptured. Edema control is significantly improved. 11/05/2022: The wound on his right plantar foot is getting better. There is still some callus accumulation around the wounds, but there are just 2 open areas now, [name] very small 1 on the medial aspect of his metatarsal head and [name] little bit larger 1 on the plantar surface. The blister on his left anterior tibial surface is now open with hanging dry skin. 11/12/2022: The left anterior tibial wound is closed. The wound on his right plantar foot is smaller with just [name] little bit of callus around the edges. 11/19/2022: The right plantar foot wound continues to contract. The surface is quite clean. The tiny satellite area on the medial aspect of his foot has closed. 11/26/2022: The wound is smaller again today. He is responding well to the offloading effects of total contact casting. 12/03/2022: The wound is about the same size today. There is [name] little bit of moisture around the perimeter of the wound. No significant tissue  breakdown. 12/10/2022: The wound is smaller today. There has been no moisture-related tissue breakdown of any maceration. Electronic Signature(s) Signed: 12/10/2022 11:26:39 AM By: Duanne Guess MD FACS Entered By: Duanne Guess on 12/10/2022 11:26:38 -------------------------------------------------------------------------------- Physical Exam Details Patient Name: Date of Service: [name] Pian, [name]. 12/10/2022 10:30 [name] Medical Record Number: [id number] Patient Account Number: [id number] Date of Birth/Sex: Treating RN: 11/05/1955 (67 y.o. M) Primary Care Provider: Arva Chafe Other Clinician: Referring Provider: Treating Provider/Extender: [name] in Treatment: 14 Constitutional Hypertensive, asymptomatic. . . . no acute distress. Respiratory Normal work of breathing on room air. Notes 12/10/2022: The wound is smaller today. There has been no moisture-related tissue breakdown of any maceration. Electronic Signature(s) Signed: 12/10/2022 11:27:19 AM By: Duanne Guess MD FACS Entered By: Duanne Guess on 12/10/2022 11:27:18 -------------------------------------------------------------------------------- Physician Orders Details Patient Name: Date of Service: [name] Pian, [name]. 12/10/2022 10:30 [name] Medical Record Number: [id number] Patient Account Number: [id number] Date of Birth/Sex: Treating RN: Jun 08, 1956 (67 y.o. [name] Primary Care Provider: Arva Chafe Other Clinician: Referring Provider: Treating Provider/Extender: [name] in Treatment: [address], [place] [name] ([id number]) [id number] Page 4 of 11 Verbal / Phone Orders: No Diagnosis Coding ICD-10 Coding Code Description L97.512 Non-pressure chronic ulcer of other part of right foot with fat layer exposed I73.9 Peripheral vascular disease, unspecified I25.10 Atherosclerotic heart disease of native coronary artery  without angina pectoris I10 Essential (primary) hypertension E11.65 Type 2 diabetes mellitus with hyperglycemia E11.40 Type 2 diabetes mellitus with diabetic neuropathy, unspecified I63.9 Cerebral infarction, unspecified Follow-up Appointments  ppointment in 1 week. - Dr. Lady Gary - room 2 Return [name] Anesthetic (In clinic) Topical Lidocaine 4% applied to wound bed Cellular or Tissue Based Products Wound #2 Right,Plantar Foot daptic or Mepitel. (DO NOT REMOVE). - Cellular or Tissue Based Product applied to wound bed, secured with steri-strips, cover with [name] Apligraf second application on 12/10/2022. Bathing/ Shower/ Hygiene May shower with protection but do not get wound dressing(s) wet. Protect dressing(s) with water repellant cover (for example, large plastic bag) or [name] cast cover and may then take shower. Edema Control - Lymphedema / SCD / Other Elevate legs to the level of the heart or above for 30 minutes daily and/or when sitting for 3-4 times [name] day throughout the day. Avoid standing for long periods of time. Patient to wear own compression stockings every day. Moisturize legs daily. Off-Loading Total Contact Cast to Right Lower Extremity Removable cast walker boot to: - right leg Non Wound Condition Left Lower Extremity Other Non Wound Condition Orders/Instructions: - 3 layer wrap with silver alginate over blister Wound Treatment Wound #2 - Foot Wound Laterality: Plantar, Right Cleanser: Soap and Water 1 x Per Week/30 Days Discharge Instructions: May shower and wash wound with dial antibacterial soap and water prior to dressing change. Cleanser: Wound Cleanser 1 x Per Week/30 Days Discharge Instructions: Cleanse the wound with wound cleanser prior to applying [name] clean dressing using gauze sponges, not tissue or cotton balls. Peri-Wound Care: Zinc Oxide Ointment 30g tube 1 x Per Week/30 Days Discharge Instructions: Apply Zinc Oxide to periwound with each dressing change Prim  Dressing: ADAPTIC TOUCH 3x4.25 (in/in) 1 x Per Week/30 Days ary Discharge Instructions: Apply to wound bed as instructed Secondary Dressing: Woven Gauze Sponge, Non-Sterile 4x4 in 1 x Per Week/30 Days Discharge Instructions: Apply over primary dressing as directed. Secondary Dressing: Zetuvit Plus 4x8 in 1 x Per Week/30 Days Discharge Instructions: Apply over primary dressing as directed. Secured With: [employer], 4.5x3.1 (in/yd) 1 x Per Week/30 Days Discharge Instructions: Secure with Kerlix as directed. Electronic Signature(s) Signed: 12/10/2022 12:15:16 PM By: Duanne Guess MD FACS Signed: 12/16/2022 10:30:00 AM By: Brenton Grills Entered By: Brenton Grills on 12/10/2022 11:43:28 Hardage, Ona [name] ([id number]) [id number] Page 5 of 11 -------------------------------------------------------------------------------- Problem List Details Patient Name: Date of Service: [place], [name]. 12/10/2022 10:30 [name] Medical Record Number: [id number] Patient Account Number: [id number] Date of Birth/Sex: Treating RN: 11-05-1955 (67 y.o. M) Primary Care Provider: Arva Chafe Other Clinician: Referring Provider: Treating Provider/Extender: [name] in Treatment: 14 Active Problems ICD-10 Encounter Code Description Active Date MDM Diagnosis L97.512 Non-pressure chronic ulcer of other part of right foot with fat layer exposed 08/31/2022 No Yes I73.9 Peripheral vascular disease, unspecified 08/31/2022 No Yes I25.10 Atherosclerotic heart disease of native coronary artery without angina pectoris 08/31/2022 No Yes I10 Essential (primary) hypertension 08/31/2022 No Yes E11.65 Type 2 diabetes mellitus with hyperglycemia 08/31/2022 No Yes E11.40 Type 2 diabetes mellitus with diabetic neuropathy, unspecified 08/31/2022 No Yes I63.9 Cerebral infarction, unspecified 08/31/2022 No Yes Inactive Problems Resolved Problems ICD-10 Code Description  Active Date Resolved Date L97.821 Non-pressure chronic ulcer of other part of left lower leg limited to breakdown of skin 11/05/2022 11/05/2022 Electronic Signature(s) Signed: 12/10/2022 11:25:36 AM By: Duanne Guess MD FACS Entered By: Duanne Guess on 12/10/2022 11:25:36 -------------------------------------------------------------------------------- Progress Note Details Patient Name: Date of Service: [name] Pian, [name]. 12/10/2022 10:30 [name] Renee Harder, Jeovanni [name] ([id number]) [id number] Page 6 of 11 Medical Record Number: [id number] Patient Account  Number: [id number] Date of Birth/Sex: Treating RN: [birth year] (67 y.o. M) Primary Care Provider: Arva Chafe Other Clinician: Referring Provider: Treating Provider/Extender: [name] in Treatment: 14 Subjective Chief Complaint Information obtained from Patient 05/14/2020; patient is here for review of an abrasion injury on the left lateral calf 08/31/2022: [name] (1st metatarsal base, plantar) History of Present Illness (HPI) ADMISSION 05/14/2020; this is [name] with multiple medical issues. Predominantly he has type 2 diabetes with [name] and also history of fairly significant PAD. He had [name] in February 2017 he also had an atherectomy and angioplasty by Dr. [name] of the right popliteal artery in 2016. He is supposed to be getting arterial studies annually however this was interrupted last year because of the pandemic. He tells [place] he was at [facility] 2 weeks ago was getting out of of the scooter and traumatized his left lateral lower leg. There was [name] as the patient is on Plavix and Eliquis. They have been dressing this with Neosporin and doing [name] fairly good job. Wound measures 2.5 x 3.5 it does not have any depth he does not have [name] in his legs outside of  surgery however he does have chronic edema and skin changes suggestive of chronic venous disease possibly some degree of lymphedema as well. Past medical history includes type 2 diabetes with peripheral neuropathy and gait instability, lumbar spondylosis, obstructive sleep apnea, history of [name], basal cell skin cancer, atrial fibrillation on anticoagulation, significant PAD as noted with [name] and [name] in 30th 2016. He also has [name] of coronary artery disease with an MI in 2002 hypertension hyperlipidemia and heart failure with preserved ejection fraction His last arterial studies I can see in epic were on 03/10/2018 this showed [name] and [name] with monophasic waveforms on the right. On the left his ABI was 1.20 with [name] and triphasic waveforms. He has not had arterial studies since. Our nurse in the clinic got an ABI on the left of 1.1 11/2; left anterior leg wound in the setting of chronic venous insufficiency. Wound was initially trauma. We have been using Hydrofera Blue under compression he has home health.. The wound looks [name] lot better today with improvement in surface area 11/16; left anterior leg wound in the setting of chronic venous insufficiency. Wound was initially trauma. We have been using Hydrofera Blue under compression. The patient is closed today. He is supposed to follow-up with vein and vascular with regards to arterial insufficiency nevertheless his leg wounds are 12/3; apparently 2 weeks ago when they were putting on their stockings they managed to get 3 wounds on the left anterior lower leg from abrasion when putting on the stockings. Home health came by the week of Thanksgiving and put Hydrofera Blue 4-layer wrap on this and there is only one superficial area remaining. The patient and his wife complained  about the difficulties getting stockings on I think we are using 20/30. We will order bilateral external compression stockings which should be easier. 12/10; wound on the left anterior lower leg is closed. He has chronic venous insufficiency we ordered him Farrow wrap stockings unfortunately he did not bring these in. READMISSION 08/31/2022 He returns with [name] diabetic  foot ulcer on the base of his first metatarsal on the right. He says that it has been present since mid December. He is currently residing in [facility] until March and [name] podiatrist has been looking after him there. They have been simply painting the area with Betadine. He has not had any lower extremity arterial studies since 2019, at which time his right ABI was 0.69. Measured in clinic today, it was 0.71. He is not aware of his most recent hemoglobin A1c, but historically he has had exceptionally poor control. On the basis of his right first metatarsal, there is [name] small crescent shaped wound. There is surrounding eschar and callus. There is no malodor or purulent drainage. 09/08/2022: The original wound is smaller today and fairly clean, but there is some discoloration and [name] pulpy texture to the adjacent callus. Underneath this, the tissue is open exposing the fat layer. It looks like perhaps there was [name] crack in the callus and moisture got under the skin and caused breakdown. 09/15/2022: There has been more moisture related tissue breakdown. The callus is very soft and the underlying tissue is more open. 09/28/2022: The wound looks much better. He has done [name] good job keeping it dry. There is some callus overlying much of the wound surface. There is slough on the exposed open areas. 10/14/2022: There has been [name] lot of moisture related tissue breakdown. He is still forming callus over the top and then it seems that moisture gets underneath the callus and causes tissue damage. There is slough on the exposed wound surface. They will be  moving back to the [place] the [place] this weekend. 10/21/2022: His foot is less wet, but there is no significant change to his wound. He has developed [name] blister on his left anterior tibial surface. There is no open wound here, but he does have some fairly significant edema. We are planning to apply [name] total contact cast today. 10/23/2022: Here for his obligatory first cast change. He says he has not had any issues wearing the cast or walking in the boot. No detrimental effects on his wound. 10/29/2022: The wound is looking better. Clearly, putting him in the cast has prevented water from getting in under his callus and causing further tissue breakdown. The blister on his left anterior tibial surface has not yet ruptured. Edema control is significantly improved. 11/05/2022: The wound on his right plantar foot is getting better. There is still some callus accumulation around the wounds, but there are just 2 open areas now, [name] very small 1 on the medial aspect of his metatarsal head and [name] little bit larger 1 on the plantar surface. The blister on his left anterior tibial surface is now open with hanging dry skin. 11/12/2022: The left anterior tibial wound is closed. The wound on his right plantar foot is smaller with just [name] little bit of callus around the edges. 11/19/2022: The right plantar foot wound continues to contract. The surface is quite clean. The tiny satellite area on the medial aspect of his foot has closed. 11/26/2022: The wound is smaller again today. He is responding well to the offloading effects of total contact casting. 12/03/2022: The wound is about the same size today. There is [name] little bit of moisture around the perimeter of the wound. No significant tissue breakdown. 12/10/2022: The wound is smaller today. There has been no moisture-related tissue breakdown of any maceration. ALGIRD, RIETZ [name] ([id number]) [id number] Page 7 of 11 Patient History Information  obtained from Patient. Family History Unknown History. Social History Never smoker, Marital Status - Single, Alcohol Use - Never, Drug Use - No History, Caffeine Use - Rarely. Medical History Respiratory Patient has history of Sleep Apnea Cardiovascular Patient has history of Arrhythmia - [name]-Fib, Coronary Artery Disease - s/p CABG, Deep Vein Thrombosis, Hypertension, Myocardial Infarction, Peripheral Arterial Disease - s/p Fempop x 2 Endocrine Patient has history of Type II Diabetes Neurologic Patient has history of Neuropathy Hospitalization/Surgery History - colonoscopy. - polypectomy. - peripheral vascular cath. - shoulder arthroscopy. - carpal tunnel release. - coronary artery bypass. - appendectomy. - cardiac cath. - coronary angioplasty. - thrombectomy. - knee arthroplasty. - popliteal artery stent. 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 no acute distress. Vitals Time Taken: 10:18 AM, Height: 70 in, Weight: 260 lbs, BMI: 37.3, Temperature: 98.0 F, Pulse: 61 bpm, Respiratory Rate: 20 breaths/min, Blood Pressure: 182/67 mmHg. Respiratory Normal work of breathing on room air. General Notes: 12/10/2022: The wound is smaller today. There has been no moisture-related tissue breakdown of any maceration. Integumentary (Hair, Skin) Wound #2 status is Open. Original cause of wound was Gradually Appeared. The date acquired was: 07/08/2022. The wound has been in treatment 14 weeks. The wound is located on the Right,Plantar Foot. The wound measures 1.4cm length x 1.5cm width x 0.1cm depth; 1.649cm^2 area and 0.165cm^3 volume. There is Fat Layer (Subcutaneous Tissue) exposed. There is [name] medium amount of serosanguineous drainage noted. The wound margin is distinct with the outline attached to the wound base. There is medium (34-66%) red  granulation within the wound bed. There is [name] medium (34-66%) amount of necrotic tissue within the wound bed including Adherent Slough. The periwound skin appearance had no abnormalities noted for color. The periwound skin appearance exhibited: Callus, Maceration. Periwound temperature was noted as No Abnormality. Assessment Active Problems ICD-10 Non-pressure chronic ulcer of other part of right foot with fat layer exposed Peripheral vascular disease, unspecified Atherosclerotic heart disease of native coronary artery without angina pectoris Essential (primary) hypertension Type 2 diabetes mellitus with hyperglycemia Type 2 diabetes mellitus with diabetic neuropathy, unspecified Cerebral infarction, unspecified Procedures Recker, Claris [name] ([id number]) [id number] Page 8 of 11 Wound #2 Pre-procedure diagnosis of Wound #2 is [name] Diabetic Wound/Ulcer of the Lower Extremity located on the Right,Plantar Foot. [name] skin graft procedure using [name] bioengineered skin substitute/cellular or tissue based product was performed by Duanne Guess, MD with the following instrument(s): Forceps and Scissors. Apligraf was applied and secured with Steri-Strips. 11 sq cm of product was utilized and 33 sq cm was wasted due to too big. Post Application, Adaptic was applied. [name] Time Out was conducted at 11:10, prior to the start of the procedure. The procedure was tolerated well with [name] pain level of 0 throughout and [name] pain level of 0 following the procedure. Post procedure Diagnosis Wound #2: Same as Pre-Procedure General Notes: scribed for Dr. Lady Gary by Brenton Grills, RN. Pre-procedure diagnosis of Wound #2 is [name] Diabetic Wound/Ulcer of the Lower Extremity located on the Right,Plantar Foot . There was [name] T Contact Cast otal Procedure by Duanne Guess, MD. Post procedure Diagnosis Wound #2: Same as Pre-Procedure Notes: MD prepped wound for casting.. Plan Follow-up Appointments: Return  Appointment in 1 week. - Dr. Lady Gary - room 2 Anesthetic: (In clinic) Topical Lidocaine 4% applied to wound bed Cellular or Tissue Based Products: Wound #2 Right,Plantar Foot: Cellular or Tissue Based  Product applied to wound bed, secured with steri-strips, cover with Adaptic or Mepitel. (DO NOT REMOVE). - Apligraf second application on 12/10/2022. Bathing/ Shower/ Hygiene: May shower with protection but do not get wound dressing(s) wet. Protect dressing(s) with water repellant cover (for example, large plastic bag) or [name] cast cover and may then take shower. Edema Control - Lymphedema / SCD / Other: Elevate legs to the level of the heart or above for 30 minutes daily and/or when sitting for 3-4 times [name] day throughout the day. Avoid standing for long periods of time. Patient to wear own compression stockings every day. Moisturize legs daily. Off-Loading: T Contact Cast to Right Lower Extremity otal Removable cast walker boot to: - right leg Non Wound Condition: Other Non Wound Condition Orders/Instructions: - 3 layer wrap with silver alginate over blister WOUND #2: - Foot Wound Laterality: Plantar, Right Cleanser: Soap and Water 1 x Per Week/30 Days Discharge Instructions: May shower and wash wound with dial antibacterial soap and water prior to dressing change. Cleanser: Wound Cleanser 1 x Per Week/30 Days Discharge Instructions: Cleanse the wound with wound cleanser prior to applying [name] clean dressing using gauze sponges, not tissue or cotton balls. Peri-Wound Care: Zinc Oxide Ointment 30g tube 1 x Per Week/30 Days Discharge Instructions: Apply Zinc Oxide to periwound with each dressing change Prim Dressing: ADAPTIC TOUCH 3x4.25 (in/in) 1 x Per Week/30 Days ary Discharge Instructions: Apply to wound bed as instructed Secondary Dressing: Woven Gauze Sponge, Non-Sterile 4x4 in 1 x Per Week/30 Days Discharge Instructions: Apply over primary dressing as directed. Secondary Dressing:  Zetuvit Plus 4x8 in 1 x Per Week/30 Days Discharge Instructions: Apply over primary dressing as directed. Secured With: [employer], 4.5x3.1 (in/yd) 1 x Per Week/30 Days Discharge Instructions: Secure with Kerlix as directed. 12/10/2022: The wound is smaller today. There has been no moisture-related tissue breakdown or any maceration. The wound bed was prepared for application of his skin substitute. The Apligraf was then hydrated with saline and fenestrated. It was applied to the wound bed in standard fashion and trimmed to an appropriate size. Zinc oxide was applied to the periwound and then everything was secured in place with Adaptic and Steri-Strips. [name] total contact cast was then applied without difficulty. He will follow-up in 1 week. Electronic Signature(s) Signed: 12/17/2022 9:31:56 AM By: Shawn Stall RN, BSN Signed: 01/18/2023 2:55:18 PM By: Duanne Guess MD FACS Previous Signature: 12/10/2022 11:28:38 AM Version By: Duanne Guess MD FACS Entered By: Shawn Stall on 12/17/2022 09:26:21 -------------------------------------------------------------------------------- HxROS Details Patient Name: Date of Service: [name] Pian, [name]. 12/10/2022 10:30 [name] Renee Harder, Cary [name] ([id number]) [phone].pdf Page 9 of 11 Medical Record Number: [id number] Patient Account Number: [id number] Date of Birth/Sex: Treating RN: 03-04-1956 (67 y.o. M) Primary Care Provider: Arva Chafe Other Clinician: Referring Provider: Treating Provider/Extender: [name] in Treatment: 14 Information Obtained From Patient Respiratory Medical History: Positive for: Sleep Apnea Cardiovascular Medical History: Positive for: Arrhythmia - [name]-Fib; Coronary Artery Disease - s/p CABG; Deep Vein Thrombosis; Hypertension; Myocardial Infarction; Peripheral Arterial Disease - s/p Fempop x 2 Past Medical History Notes: CVA x 3 Endocrine Medical  History: Positive for: Type II Diabetes Time with diabetes: since 1997 Treated with: Insulin, Oral agents Blood sugar tested every day: Yes Tested : 2-3x per day Musculoskeletal Medical History: Past Medical History Notes: carpal tunnel syndrome Neurologic Medical History: Positive for: Neuropathy Past Medical History Notes: stroke Oncologic Medical History: Past Medical History Notes: skin cancer Immunizations Pneumococcal  Vaccine: Received Pneumococcal Vaccination: Yes Received Pneumococcal Vaccination On or After 60th Birthday: Yes Implantable Devices None Hospitalization / Surgery History Type of Hospitalization/Surgery colonoscopy polypectomy peripheral vascular cath shoulder arthroscopy carpal tunnel release coronary artery bypass appendectomy cardiac cath coronary angioplasty thrombectomy knee arthroplasty popliteal artery stent Family and Social History Unknown History: Yes; Never smoker; Marital Status - Single; Alcohol Use: Never; Drug Use: No History; Caffeine Use: Rarely; Financial Concerns: No; Food, Clothing or Shelter Needs: No; Support System Lacking: No; Transportation Concerns: No Balash, Yoshito [name] ([id number]) [phone].pdf Page 10 of 11 Electronic Signature(s) Signed: 12/10/2022 12:15:16 PM By: Duanne Guess MD FACS Entered By: Duanne Guess on 12/10/2022 11:26:49 -------------------------------------------------------------------------------- Total Contact Cast Details Patient Name: Date of Service: [name] [name]. 12/10/2022 10:30 [name] Medical Record Number: [id number] Patient Account Number: [id number] Date of Birth/Sex: Treating RN: 1955/12/13 (67 y.o. [name] Primary Care Provider: Arva Chafe Other Clinician: Referring Provider: Treating Provider/Extender: [name] in Treatment: 14 T Contact Cast Applied for Wound Assessment: otal Wound #2 Right,Plantar  Foot Performed By: Physician Duanne Guess, MD Post Procedure Diagnosis Same as Pre-procedure Notes MD prepped wound for casting. Electronic Signature(s) Signed: 12/10/2022 12:15:16 PM By: Duanne Guess MD FACS Signed: 12/16/2022 10:30:00 AM By: Brenton Grills Entered By: Brenton Grills on 12/10/2022 11:04:05 -------------------------------------------------------------------------------- SuperBill Details Patient Name: Date of Service: [name] [name]. 12/10/2022 Medical Record Number: [id number] Patient Account Number: [id number] Date of Birth/Sex: Treating RN: Jul 13, 1956 (67 y.o. [name] Primary Care Provider: Arva Chafe Other Clinician: Referring Provider: Treating Provider/Extender: [name] in Treatment: 14 Diagnosis Coding ICD-10 Codes Code Description [phone] Non-pressure chronic ulcer of other part of right foot with fat layer exposed I73.9 Peripheral vascular disease, unspecified I25.10 Atherosclerotic heart disease of native coronary artery without angina pectoris I10 Essential (primary) hypertension E11.65 Type 2 diabetes mellitus with hyperglycemia E11.40 Type 2 diabetes mellitus with diabetic neuropathy, unspecified I63.9 Cerebral infarction, unspecified Facility Procedures : CPT4 Code: [id number] Description: (Facility Use Only) Apligraf 1 SQ CM ICD-10 Diagnosis Description L97.512 Non-pressure chronic ulcer of other part of right foot with fat layer exposed Modifier: Quantity: 44 : [place], MAR CPT4 Code: [id number] K [name] ([id number]) Description: 15271 - SKIN SUB GRAFT TRNK/ARM/LEG [id number] ICD-10 Diagnosis Description L97.512 Non-pressure chronic ulcer of other part of right foot with fat layer exposed Modifier: [id number] Quantity: 1 27.pdf Page 11 of 11 : CPT4 Code: [id number] 2 Description: 9445 - APPLY TOTAL CONTACT LEG CAST ICD-10 Diagnosis Description L97.512 Non-pressure chronic ulcer of other  part of right foot with fat layer exposed Modifier: 1 Quantity: Physician Procedures : CPT4 Code Description Modifier [id number] - WC PHYS LEVEL 3 - EST PT 25 ICD-10 Diagnosis Description L97.512 Non-pressure chronic ulcer of other part of right foot with fat layer exposed E11.40 Type 2 diabetes mellitus with diabetic neuropathy,  unspecified I73.9 Peripheral vascular disease, unspecified E11.65 Type 2 diabetes mellitus with hyperglycemia Quantity: 1 : [id number] 15271 - WC PHYS SKIN SUB GRAFT TRNK/ARM/LEG ICD-10 Diagnosis Description L97.512 Non-pressure chronic ulcer of other part of right foot with fat layer exposed Quantity: 1 : [id number] 29445 - WC PHYS APPLY TOTAL CONTACT CAST ICD-10 Diagnosis Description L97.512 Non-pressure chronic ulcer of other part of right foot with fat layer exposed Quantity: 1 Electronic Signature(s) Signed: 12/10/2022 11:29:13 AM By: Duanne Guess MD FACS Entered By: Duanne Guess on 12/10/2022 11:29:13

## 2023-01-19 ENCOUNTER — Telehealth: Payer: Self-pay | Admitting: Internal Medicine

## 2023-01-19 ENCOUNTER — Encounter (HOSPITAL_BASED_OUTPATIENT_CLINIC_OR_DEPARTMENT_OTHER): Payer: Medicare HMO | Attending: General Surgery | Admitting: General Surgery

## 2023-01-19 DIAGNOSIS — E1165 Type 2 diabetes mellitus with hyperglycemia: Secondary | ICD-10-CM | POA: Insufficient documentation

## 2023-01-19 DIAGNOSIS — I5032 Chronic diastolic (congestive) heart failure: Secondary | ICD-10-CM | POA: Diagnosis not present

## 2023-01-19 DIAGNOSIS — E1151 Type 2 diabetes mellitus with diabetic peripheral angiopathy without gangrene: Secondary | ICD-10-CM | POA: Diagnosis not present

## 2023-01-19 DIAGNOSIS — I4891 Unspecified atrial fibrillation: Secondary | ICD-10-CM | POA: Diagnosis not present

## 2023-01-19 DIAGNOSIS — E11621 Type 2 diabetes mellitus with foot ulcer: Secondary | ICD-10-CM | POA: Diagnosis not present

## 2023-01-19 DIAGNOSIS — L97512 Non-pressure chronic ulcer of other part of right foot with fat layer exposed: Secondary | ICD-10-CM | POA: Insufficient documentation

## 2023-01-19 DIAGNOSIS — G4733 Obstructive sleep apnea (adult) (pediatric): Secondary | ICD-10-CM | POA: Diagnosis not present

## 2023-01-19 DIAGNOSIS — I2510 Atherosclerotic heart disease of native coronary artery without angina pectoris: Secondary | ICD-10-CM | POA: Diagnosis not present

## 2023-01-19 DIAGNOSIS — Z85828 Personal history of other malignant neoplasm of skin: Secondary | ICD-10-CM | POA: Diagnosis not present

## 2023-01-19 DIAGNOSIS — Z7902 Long term (current) use of antithrombotics/antiplatelets: Secondary | ICD-10-CM | POA: Diagnosis not present

## 2023-01-19 DIAGNOSIS — I480 Paroxysmal atrial fibrillation: Secondary | ICD-10-CM

## 2023-01-19 DIAGNOSIS — E1140 Type 2 diabetes mellitus with diabetic neuropathy, unspecified: Secondary | ICD-10-CM | POA: Insufficient documentation

## 2023-01-19 DIAGNOSIS — I110 Hypertensive heart disease with heart failure: Secondary | ICD-10-CM | POA: Diagnosis not present

## 2023-01-19 DIAGNOSIS — Z7901 Long term (current) use of anticoagulants: Secondary | ICD-10-CM | POA: Insufficient documentation

## 2023-01-19 MED ORDER — APIXABAN 5 MG PO TABS
5.0000 mg | ORAL_TABLET | Freq: Two times a day (BID) | ORAL | 0 refills | Status: DC
Start: 2023-01-19 — End: 2023-08-18

## 2023-01-19 NOTE — Telephone Encounter (Signed)
Ok to give 1-2 weeks of samples. Per last phone note:   Patient assistance application for Eliquis sent to manufacturer. Of note, patient has not brought in proof of income or out of pocket expenses     Pt needs to bring that information in so that application can be processed.

## 2023-01-19 NOTE — Telephone Encounter (Signed)
Call to patient.  LVM to call office.  Advised that he needs proof of income to complete the process for his assistance.  We can provide samples today but in order to complete this process, we must have his proof of income.

## 2023-01-19 NOTE — Telephone Encounter (Signed)
  Patient calling the office for samples of medication:   1.  What medication and dosage are you requesting samples for?          apixaban (ELIQUIS) 5 MG TABS tablet     2.  Are you currently out of this medication? Yes   The patient received a notification indicating that additional information is required for his patient assistance application. He is currently out of medication and would appreciate samples to cover a two-week period. He has a doctor's appointment today and would like to pick up the samples around 10:30 AM to avoid making two trips.

## 2023-01-19 NOTE — Telephone Encounter (Signed)
Samples picked up by DPR friend.  Advised to her as well as note on sample bag that patient needs proof of income brought in ASAP

## 2023-01-19 NOTE — Progress Notes (Signed)
Garofano, Eino A ([id number]) [id number] Page 1 of 7 Visit Report for 01/19/2023 Arrival Information Details Patient Name: Date of Service: [place], [name] 01/19/2023 10:00 A M Medical Record Number: [id number] Patient Account Number: [id number] Date of Birth/Sex: Treating RN: August 30, 1955 (67 y.o. M) Primary Care Ginnette Gates: Arva Chafe Other Clinician: Referring Muhsin Doris: Treating Koji Niehoff/Extender: [name] in Treatment: 20 Visit Information History Since Last Visit All ordered tests and consults were completed: No Patient Arrived: Ambulatory Added or deleted any medications: No Arrival Time: 09:58 Any new allergies or adverse reactions: No Accompanied By: friend Had a fall or experienced change in No Transfer Assistance: None activities of daily living that may affect Patient Identification Verified: Yes risk of falls: Secondary Verification Process Completed: Yes Signs or symptoms of abuse/neglect since last visito No Patient Requires Transmission-Based Precautions: No Hospitalized since last visit: No Patient Has Alerts: No Implantable device outside of the clinic excluding No cellular tissue based products placed in the center since last visit: Pain Present Now: No Electronic Signature(s) Signed: 01/19/2023 2:54:18 PM By: Dayton Scrape Entered By: Dayton Scrape on 01/19/2023 09:59:20 -------------------------------------------------------------------------------- Encounter Discharge Information Details Patient Name: Date of Service: [place], [name] 01/19/2023 10:00 A M Medical Record Number: [id number] Patient Account Number: [id number] Date of Birth/Sex: Treating RN: [birth year] (67 y.o. [name] Primary Care Zayon Trulson: Arva Chafe Other Clinician: Referring Senai Kingsley: Treating Arianni Gallego/Extender: [name] in Treatment: 20 Encounter Discharge Information Items Post  Procedure Vitals Discharge Condition: Stable Temperature (F): 98.1 Ambulatory Status: Ambulatory Pulse (bpm): 77 Discharge Destination: Home Respiratory Rate (breaths/min): 20 Transportation: Private Auto Blood Pressure (mmHg): 186/89 Accompanied By: self Schedule Follow-up Appointment: Yes Clinical Summary of Care: Patient Declined Electronic Signature(s) Signed: 01/19/2023 3:06:27 PM By: Samuella Bruin Entered By: Samuella Bruin on 01/19/2023 10:18:42 Vergara, Aithan A ([id number]) [id number] Page 2 of 7 -------------------------------------------------------------------------------- Lower Extremity Assessment Details Patient Name: Date of Service: [place], [name] 01/19/2023 10:00 A M Medical Record Number: [id number] Patient Account Number: [id number] Date of Birth/Sex: Treating RN: January 13, 1956 (67 y.o. Cline Cools Primary Care [place] Coldren: Arva Chafe Other Clinician: Referring Idonia Zollinger: Treating Narjis Mira/Extender: [name] in Treatment: 20 Edema Assessment Assessed: [Left: No] [Right: No] Edema: [Left: Ye] [Right: s] Calf Left: Right: Point of Measurement: From Medial Instep 37 cm Ankle Left: Right: Point of Measurement: From Medial Instep 26 cm Electronic Signature(s) Signed: 01/19/2023 4:25:24 PM By: Redmond Pulling RN, BSN Entered By: Redmond Pulling on 01/19/2023 10:05:07 -------------------------------------------------------------------------------- Multi Wound Chart Details Patient Name: Date of Service: [name], [name] 01/19/2023 10:00 A M Medical Record Number: [id number] Patient Account Number: [id number] Date of Birth/Sex: Treating RN: [birth year] (67 y.o. M) Primary Care Jo Booze: Arva Chafe Other Clinician: Referring Nary Sneed: Treating Lealand Elting/Extender: [name] in Treatment: 20 Vital Signs Height(in): 70 Pulse(bpm): 77 Weight(lbs): 260 Blood  Pressure(mmHg): 186/89 Body Mass Index(BMI): 37.3 Temperature(F): 98.1 Respiratory Rate(breaths/min): 20 [2:Photos:] [N/A:N/A] Right, Plantar Foot N/A N/A Wound Location: Gradually Appeared N/A N/A Wounding Event: Diabetic Wound/Ulcer of the Lower N/A N/A Primary Etiology: Extremity Sleep Apnea, Arrhythmia, Coronary N/A N/A Comorbid History: Artery Disease, Deep Vein [place], [place] A ([id number]) [id number] Page 3 of 7 Thrombosis, Hypertension, Myocardial Infarction, Peripheral Arterial Disease, Type II Diabetes, Neuropathy 07/08/2022 N/A N/A Date Acquired: 20 N/A N/A Weeks of Treatment: Open N/A N/A Wound Status: No N/A N/A Wound Recurrence: 1x1.1x0.2 N/A N/A Measurements L x W x D (cm) 0.864 N/A N/A A (cm) : rea 0.173  N/A N/A Volume (cm) : 66.20% N/A N/A % Reduction in A rea: 32.20% N/A N/A % Reduction in Volume: Grade 1 N/A N/A Classification: Medium N/A N/A Exudate A mount: Serosanguineous N/A N/A Exudate Type: red, brown N/A N/A Exudate Color: Distinct, outline attached N/A N/A Wound Margin: Large (67-100%) N/A N/A Granulation A mount: Red N/A N/A Granulation Quality: Small (1-33%) N/A N/A Necrotic A mount: Fat Layer (Subcutaneous Tissue): Yes N/A N/A Exposed Structures: Fascia: No Tendon: No Muscle: No Joint: No Bone: No Medium (34-66%) N/A N/A Epithelialization: Callus: Yes N/A N/A Periwound Skin Texture: Maceration: No N/A N/A Periwound Skin Moisture: No Abnormalities Noted N/A N/A Periwound Skin Color: No Abnormality N/A N/A Temperature: Cellular or Tissue Based Product N/A N/A Procedures Performed: T Contact Cast otal Treatment Notes Wound #2 (Foot) Wound Laterality: Plantar, Right Cleanser Soap and Water Discharge Instruction: May shower and wash wound with dial antibacterial soap and water prior to dressing change. Wound Cleanser Discharge Instruction: Cleanse the wound with wound cleanser prior to  applying a clean dressing using gauze sponges, not tissue or cotton balls. Peri-Wound Care Zinc Oxide Ointment 30g tube Discharge Instruction: Apply Zinc Oxide to periwound with each dressing change Topical Primary Dressing ADAPTIC TOUCH 3x4.25 (in/in) Discharge Instruction: Apply to wound bed as instructed Secondary Dressing Woven Gauze Sponge, Non-Sterile 4x4 in Discharge Instruction: Apply over primary dressing as directed. Zetuvit Plus 4x8 in Discharge Instruction: Apply over primary dressing as directed. Secured With [employer], 4.5x3.1 (in/yd) Discharge Instruction: Secure with Kerlix as directed. Compression Wrap Compression Stockings Add-Ons Electronic Signature(s) Signed: 01/19/2023 10:29:47 AM By: Duanne Guess MD FACS Entered By: Duanne Guess on 01/19/2023 10:29:47 Mcclard, Etienne A ([id number]) [id number] Page 4 of 7 -------------------------------------------------------------------------------- Multi-Disciplinary Care Plan Details Patient Name: Date of Service: [place], [name] 01/19/2023 10:00 A M Medical Record Number: [id number] Patient Account Number: [id number] Date of Birth/Sex: Treating RN: 1956/04/02 (67 y.o. [name] Primary Care Keshan Reha: Arva Chafe Other Clinician: Referring Brihany Butch: Treating Essie Gehret/Extender: [name] in Treatment: 20 Multidisciplinary Care Plan reviewed with physician Active Inactive Abuse / Safety / Falls / Self Care Management Nursing Diagnoses: Impaired physical mobility Potential for falls Goals: Patient will not experience any injury related to falls Date Initiated: 08/31/2022 Target Resolution Date: 02/19/2023 Goal Status: Active Patient/caregiver will verbalize/demonstrate measures taken to improve the patient's personal safety Date Initiated: 08/31/2022 Target Resolution Date: 02/19/2023 Goal Status: Active Interventions: Provide  education on basic hygiene Provide education on fall prevention Notes: Electronic Signature(s) Signed: 01/19/2023 3:06:27 PM By: Samuella Bruin Entered By: Samuella Bruin on 01/19/2023 10:17:10 -------------------------------------------------------------------------------- Pain Assessment Details Patient Name: Date of Service: [name], [name] 01/19/2023 10:00 A M Medical Record Number: [id number] Patient Account Number: [id number] Date of Birth/Sex: Treating RN: 12-16-1955 (67 y.o. M) Primary Care Nimesh Riolo: Arva Chafe Other Clinician: Referring Kelby Lotspeich: Treating Deanndra Kirley/Extender: [name] in Treatment: 20 Active Problems Location of Pain Severity and Description of Pain Patient Has Paino No Site Locations [place], [place] A ([id number]) [phone].pdf Page 5 of 7 Pain Management and Medication Current Pain Management: Electronic Signature(s) Signed: 01/19/2023 2:54:18 PM By: Dayton Scrape Entered By: Dayton Scrape on 01/19/2023 09:59:54 -------------------------------------------------------------------------------- Patient/Caregiver Education Details Patient Name: Date of Service: [place], [name] 7/2/2024andnbsp10:00 A M Medical Record Number: [id number] Patient Account Number: [id number] Date of Birth/Gender: Treating RN: 1956-02-06 (67 y.o. [name] Primary Care Physician: Arva Chafe Other Clinician: Referring Physician: Treating Physician/Extender: [name] in Treatment: 20  Education Assessment Education Provided To: Patient Education Topics Provided Offloading: Methods: Explain/Verbal Responses: Reinforcements needed, State content correctly Electronic Signature(s) Signed: 01/19/2023 3:06:27 PM By: Samuella Bruin Entered By: Samuella Bruin on 01/19/2023 10:17:39 -------------------------------------------------------------------------------- Wound  Assessment Details Patient Name: Date of Service: [name], [name] 01/19/2023 10:00 A M Medical Record Number: [id number] Patient Account Number: [id number] Date of Birth/Sex: Treating RN: Sep 29, 1955 (67 y.o. Cline Cools Primary Care Duanne Duchesne: Arva Chafe Other Clinician: Referring Keirra Zeimet: Treating Jaedyn Lard/Extender: Ocie Doyne [place], [place] A ([id number]) [id number] Page 6 of 7 Weeks in Treatment: 20 Wound Status Wound Number: 2 Primary Diabetic Wound/Ulcer of the Lower Extremity Etiology: Wound Location: Right, Plantar Foot Wound Open Wounding Event: Gradually Appeared Status: Date Acquired: 07/08/2022 Comorbid Sleep Apnea, Arrhythmia, Coronary Artery Disease, Deep Vein Weeks Of Treatment: 20 History: Thrombosis, Hypertension, Myocardial Infarction, Peripheral Arterial Clustered Wound: No Disease, Type II Diabetes, Neuropathy Photos Wound Measurements Length: (cm) 1 Width: (cm) 1.1 Depth: (cm) 0.2 Area: (cm) 0.864 Volume: (cm) 0.173 % Reduction in Area: 66.2% % Reduction in Volume: 32.2% Epithelialization: Medium (34-66%) Tunneling: No Undermining: No Wound Description Classification: Grade 1 Wound Margin: Distinct, outline attached Exudate Amount: Medium Exudate Type: Serosanguineous Exudate Color: red, brown Foul Odor After Cleansing: No Slough/Fibrino Yes Wound Bed Granulation Amount: Large (67-100%) Exposed Structure Granulation Quality: Red Fascia Exposed: No Necrotic Amount: Small (1-33%) Fat Layer (Subcutaneous Tissue) Exposed: Yes Necrotic Quality: Adherent Slough Tendon Exposed: No Muscle Exposed: No Joint Exposed: No Bone Exposed: No Periwound Skin Texture Texture Color No Abnormalities Noted: No No Abnormalities Noted: Yes Callus: Yes Temperature / Pain Temperature: No Abnormality Moisture No Abnormalities Noted: No Maceration: No Treatment Notes Wound #2 (Foot) Wound Laterality:  Plantar, Right Cleanser Soap and Water Discharge Instruction: May shower and wash wound with dial antibacterial soap and water prior to dressing change. Wound Cleanser Discharge Instruction: Cleanse the wound with wound cleanser prior to applying a clean dressing using gauze sponges, not tissue or cotton balls. Peri-Wound Care Zinc Oxide Ointment 30g tube Discharge Instruction: Apply Zinc Oxide to periwound with each dressing change Topical Primary Dressing Kinney, Roney A ([id number]) [id number] Page 7 of 7 ADAPTIC TOUCH 3x4.25 (in/in) Discharge Instruction: Apply to wound bed as instructed Secondary Dressing Woven Gauze Sponge, Non-Sterile 4x4 in Discharge Instruction: Apply over primary dressing as directed. Zetuvit Plus 4x8 in Discharge Instruction: Apply over primary dressing as directed. Secured With [employer], 4.5x3.1 (in/yd) Discharge Instruction: Secure with Kerlix as directed. Compression Wrap Compression Stockings Add-Ons Electronic Signature(s) Signed: 01/19/2023 4:25:24 PM By: Redmond Pulling RN, BSN Entered By: Redmond Pulling on 01/19/2023 10:05:58 -------------------------------------------------------------------------------- Vitals Details Patient Name: Date of Service: [name], [name] 01/19/2023 10:00 A M Medical Record Number: [id number] Patient Account Number: [id number] Date of Birth/Sex: Treating RN: 25-Nov-1955 (67 y.o. M) Primary Care Carolee Channell: Arva Chafe Other Clinician: Referring Jacson Rapaport: Treating Daundre Biel/Extender: [name] in Treatment: 20 Vital Signs Time Taken: 09:59 Temperature (F): 98.1 Height (in): 70 Pulse (bpm): 77 Weight (lbs): 260 Respiratory Rate (breaths/min): 20 Body Mass Index (BMI): 37.3 Blood Pressure (mmHg): 186/89 Reference Range: 80 - 120 mg / dl Electronic Signature(s) Signed: 01/19/2023 2:54:18 PM By: Dayton Scrape Entered By: Dayton Scrape on 01/19/2023  09:59:47

## 2023-01-19 NOTE — Progress Notes (Signed)
SALOMON, ISIP [name] ([id number]) [id number] Page 1 of 11 Visit Report for 01/19/2023 Chief Complaint Document Details Patient Name: Date of Service: [name] [name], [name] [name]. 01/19/2023 10:00 [name] [name] Medical Record Number: [id number] Patient Account Number: [id number] Date of Birth/Sex: Treating RN: 07/05/1956 (67 y.o. [name]) Primary Care Provider: Arva Chafe Other Clinician: Referring Provider: Treating Provider/Extender: [name] in Treatment: 20 Information Obtained from: Patient Chief Complaint 05/14/2020; patient is here for review of an abrasion injury on the left lateral calf 08/31/2022: [name] (1st metatarsal base, plantar) Electronic Signature(s) Signed: 01/19/2023 10:30:16 AM By: Duanne Guess MD FACS Entered By: Duanne Guess on 01/19/2023 10:30:16 -------------------------------------------------------------------------------- Cellular or Tissue Based Product Details Patient Name: Date of Service: [name] [name]. 01/19/2023 10:00 [name] [name] Medical Record Number: [id number] Patient Account Number: [id number] Date of Birth/Sex: Treating RN: 1956/01/21 (67 y.o. [name]) Primary Care Provider: Arva Chafe Other Clinician: Referring Provider: Treating Provider/Extender: [name] in Treatment: 20 Cellular or Tissue Based Product Type Wound #2 Right,Plantar Foot Applied to: Performed By: Physician Duanne Guess, MD Cellular or Tissue Based Product Type: Epicord Level of Consciousness (Pre-procedure): Awake and Alert Pre-procedure Verification/Time Out Yes - 10:14 Taken: Location: genitalia / hands / feet / multiple digits Wound Size (sq cm): 1.1 Product Size (sq cm): 6 Waste Size (sq cm): 2 Waste Reason: wound size Amount of Product Applied (sq cm): 4 Instrument Used: Forceps, Scissors Lot #: [id number] Expiration Date: 05/21/2027 Fenestrated: No Reconstituted: Yes Solution Type:  normal saline Solution Amount: 10 ml Lot #: [id number] KS Solution Expiration Date: 08/10/2024 Secured: Yes Secured With: Steri-Strips Dressing Applied: Yes Primary Dressing: adaptic Procedural Pain: 0 Post Procedural Pain: 0 [name], [name] [name] ([id number]) [phone].pdf Page 2 of 11 Response to Treatment: Procedure was tolerated well Level of Consciousness (Post- Awake and Alert procedure): Post Procedure Diagnosis Same as Pre-procedure Notes scribed for Dr. Lady Gary by Samuella Bruin, RN Electronic Signature(s) Signed: 01/19/2023 10:30:09 AM By: Duanne Guess MD FACS Entered By: Duanne Guess on 01/19/2023 10:30:09 -------------------------------------------------------------------------------- HPI Details Patient Name: Date of Service: [name] [name]. 01/19/2023 10:00 [name] [name] Medical Record Number: [id number] Patient Account Number: [id number] Date of Birth/Sex: Treating RN: 08/10/1955 (67 y.o. [name]) Primary Care Provider: Arva Chafe Other Clinician: Referring Provider: Treating Provider/Extender: [name] in Treatment: 20 [name] of Present Illness HPI Description: ADMISSION 05/14/2020; this is [name] [name] with multiple medical issues. Predominantly he has type 2 diabetes with [name] [name] of peripheral neuropathy and also [name] of fairly significant PAD. He had [name] in February 2017 he also had an atherectomy and angioplasty by Dr. [name] of the right popliteal artery in 2016. He is supposed to be getting arterial studies annually however this was interrupted last year because of the pandemic. He tells [place] he was at [facility] 2 weeks ago was getting out of of the scooter and traumatized his left lateral lower leg. There was [name] as the patient is on Plavix and Eliquis. They have been dressing this with Neosporin and doing [name] fairly good job. Wound measures  2.5 x 3.5 it does not have any depth he does not have [name] wound [name] in his legs outside of surgery however he does have chronic edema and skin changes suggestive of chronic venous disease possibly some degree of lymphedema as well. Past medical [name] includes type 2 diabetes with peripheral neuropathy and gait instability, lumbar spondylosis, obstructive sleep  apnea, [name] of [name], basal cell skin cancer, atrial fibrillation on anticoagulation, significant PAD as noted with [name] and [name] in 30th 2016. He also has [name] [name] of coronary artery disease with an MI in 2002 hypertension hyperlipidemia and heart failure with preserved ejection fraction His last arterial studies I can see in epic were on 03/10/2018 this showed [name] and [name] with monophasic waveforms on the right. On the left his ABI was 1.20 with [name] and triphasic waveforms. He has not had arterial studies since. Our nurse in the clinic got an ABI on the left of 1.1 11/2; left anterior leg wound in the setting of chronic venous insufficiency. Wound was initially trauma. We have been using Hydrofera Blue under compression he has home health.. The wound looks [name] lot better today with improvement in surface area 11/16; left anterior leg wound in the setting of chronic venous insufficiency. Wound was initially trauma. We have been using Hydrofera Blue under compression. The patient is closed today. He is supposed to follow-up with vein and vascular with regards to arterial insufficiency nevertheless his leg wounds are 12/3; apparently 2 weeks ago when they were putting on their stockings they managed to get 3 wounds on the left anterior lower leg from abrasion when putting on the stockings. Home health came by the week of Thanksgiving and put Hydrofera Blue 4-layer wrap on  this and there is only one superficial area remaining. The patient and his wife complained about the difficulties getting stockings on I think we are using 20/30. We will order bilateral external compression stockings which should be easier. 12/10; wound on the left anterior lower leg is closed. He has chronic venous insufficiency we ordered him Farrow wrap stockings unfortunately he did not bring these in. 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 He says that it has been present since mid December. He is currently residing in [facility] until March and [name] podiatrist has been looking after him there. They have been simply painting the area with Betadine. He has not had any lower extremity arterial studies since 2019, at which time his right ABI was 0.69. Measured in clinic today, it was 0.71. He is not aware of his most recent hemoglobin A1c, but historically he has had exceptionally poor control. On the basis of his right first metatarsal, there is [name] small crescent shaped wound. There is surrounding eschar and callus. There is no malodor or purulent drainage. 09/08/2022: The original wound is smaller today and fairly clean, but there is some discoloration and [name] pulpy texture to the adjacent callus. Underneath this, the tissue is open exposing the fat layer. It looks like perhaps there was [name] crack in the callus and moisture got under the skin and caused breakdown. 09/15/2022: There has been more moisture related tissue breakdown. The callus is very soft and the underlying tissue is more open. 09/28/2022: The wound looks much better. He has done [name] good job keeping it dry. There is some callus overlying much of the wound surface. There is slough on the exposed open areas. [name] [name], [name] [name] ([id number]) [id number] Page 3 of 11 10/14/2022: There has been [name] lot of moisture related tissue breakdown. He is still  forming callus over the top and then  it seems that moisture gets underneath the callus and causes tissue damage. There is slough on the exposed wound surface. They will be moving back to the local area [place] the [place] this weekend. 10/21/2022: His foot is less wet, but there is no significant change to his wound. He has developed [name] blister on his left anterior tibial surface. There is no open wound here, but he does have some fairly significant edema. We are planning to apply [name] total contact cast today. 10/23/2022: Here for his obligatory first cast change. He says he has not had any issues wearing the cast or walking in the boot. No detrimental effects on his wound. 10/29/2022: The wound is looking better. Clearly, putting him in the cast has prevented water from getting in under his callus and causing further tissue breakdown. The blister on his left anterior tibial surface has not yet ruptured. Edema control is significantly improved. 11/05/2022: The wound on his right plantar foot is getting better. There is still some callus accumulation around the wounds, but there are just 2 open areas now, [name] very small 1 on the medial aspect of his metatarsal head and [name] little bit larger 1 on the plantar surface. The blister on his left anterior tibial surface is now open with hanging dry skin. 11/12/2022: The left anterior tibial wound is closed. The wound on his right plantar foot is smaller with just [name] little bit of callus around the edges. 11/19/2022: The right plantar foot wound continues to contract. The surface is quite clean. The tiny satellite area on the medial aspect of his foot has closed. 11/26/2022: The wound is smaller again today. He is responding well to the offloading effects of total contact casting. 12/03/2022: The wound is about the same size today. There is [name] little bit of moisture around the perimeter of the wound. No significant tissue breakdown. 12/10/2022: The wound is smaller today. There has been  no moisture-related tissue breakdown of any maceration. 12/21/2022: The wound continues to contract. Moisture control is excellent. 12/28/2022: The wound is [name] little bit smaller today. 01/05/2023: The wound measures smaller today. Excellent moisture control with zero evidence of maceration. The wound surface is clean with healthy granulation tissue. 01/13/2023: We are leaving his Apligraf in situ for [name] second week. There is no discolored drainage or malodor coming from the site. 01/19/2023: The wound is smaller today. The periwound skin is in good condition without tissue maceration and the healed areas of tissue appear to have improved integrity. Electronic Signature(s) Signed: 01/19/2023 10:37:08 AM By: Duanne Guess MD FACS Previous Signature: 01/19/2023 10:30:42 AM Version By: Duanne Guess MD FACS Entered By: Duanne Guess on 01/19/2023 10:37:08 -------------------------------------------------------------------------------- Physical Exam Details Patient Name: Date of Service: [name] [name]. 01/19/2023 10:00 [name] [name] Medical Record Number: [id number] Patient Account Number: [id number] Date of Birth/Sex: Treating RN: Apr 15, 1956 (67 y.o. [name]) Primary Care Provider: Arva Chafe Other Clinician: Referring Provider: Treating Provider/Extender: [name] in Treatment: 20 Constitutional Hypertensive, asymptomatic. . . . no acute distress. Respiratory Normal work of breathing on room air. Notes 01/19/2023: The wound is smaller today. The periwound skin is in good condition without tissue maceration and the healed areas of tissue appear to have improved integrity. Electronic Signature(s) Signed: 01/19/2023 10:37:43 AM By: Duanne Guess MD FACS Entered By: Duanne Guess on 01/19/2023 10:37:43 [name] [name], [name] [name] ([id number]) [id number] Page 4 of 11 -------------------------------------------------------------------------------- Physician  Orders Details Patient Name: Date of Service: [name] [name], [name] [name]. 01/19/2023 10:00  [name] [name] Medical Record Number: [id number] Patient Account Number: [id number] Date of Birth/Sex: Treating RN: [birth year] (67 y.o. [name] Primary Care Provider: Arva Chafe Other Clinician: Referring Provider: Treating Provider/Extender: [name] in Treatment: 20 Verbal / Phone Orders: No Diagnosis Coding ICD-10 Coding Code Description [id number] Non-pressure chronic ulcer of other part of right foot with fat layer exposed I73.9 Peripheral vascular disease, unspecified I25.10 Atherosclerotic heart disease of native coronary artery without angina pectoris I10 Essential (primary) hypertension E11.65 Type 2 diabetes mellitus with hyperglycemia E11.40 Type 2 diabetes mellitus with diabetic neuropathy, unspecified I63.9 Cerebral infarction, unspecified Follow-up Appointments ppointment in 1 week. - Dr. Lady Gary - room 2 Return [name] Anesthetic (In clinic) Topical Lidocaine 4% applied to wound bed Cellular or Tissue Based Products Wound #2 Right,Plantar Foot Cellular or Tissue Based Product Type: - Epicord #1 Cellular or Tissue Based Product applied to wound bed, secured with steri-strips, cover with Adaptic or Mepitel. (DO NOT REMOVE). Bathing/ Shower/ Hygiene May shower with protection but do not get wound dressing(s) wet. Protect dressing(s) with water repellant cover (for example, large plastic bag) or [name] cast cover and may then take shower. Edema Control - Lymphedema / SCD / Other Elevate legs to the level of the heart or above for 30 minutes daily and/or when sitting for 3-4 times [name] day throughout the day. Avoid standing for long periods of time. Patient to wear own compression stockings every day. Moisturize legs daily. Off-Loading Removable cast walker boot to: - right leg Wound Treatment Wound #2 - Foot Wound Laterality: Plantar, Right Cleanser: Soap and Water  1 x Per Week/30 Days Discharge Instructions: May shower and wash wound with dial antibacterial soap and water prior to dressing change. Cleanser: Wound Cleanser 1 x Per Week/30 Days Discharge Instructions: Cleanse the wound with wound cleanser prior to applying [name] clean dressing using gauze sponges, not tissue or cotton balls. Peri-Wound Care: Zinc Oxide Ointment 30g tube 1 x Per Week/30 Days Discharge Instructions: Apply Zinc Oxide to periwound with each dressing change Prim Dressing: ADAPTIC TOUCH 3x4.25 (in/in) 1 x Per Week/30 Days ary Discharge Instructions: Apply to wound bed as instructed Secondary Dressing: Woven Gauze Sponge, Non-Sterile 4x4 in 1 x Per Week/30 Days Discharge Instructions: Apply over primary dressing as directed. Secondary Dressing: Zetuvit Plus 4x8 in 1 x Per Week/30 Days Discharge Instructions: Apply over primary dressing as directed. Secured With: [employer], 4.5x3.1 (in/yd) 1 x Per Week/30 Days Discharge Instructions: Secure with Kerlix as directed. [name] [name], [name] [name] ([id number]) [id number] Page 5 of 11 Patient Medications llergies: No Known Drug Allergies [name] Notifications Medication Indication Start End 01/19/2023 lidocaine DOSE topical 4 % cream - cream topical Electronic Signature(s) Signed: 01/19/2023 10:50:39 AM By: Duanne Guess MD FACS Entered By: Duanne Guess on 01/19/2023 10:38:42 -------------------------------------------------------------------------------- Problem List Details Patient Name: Date of Service: [name] [name]. 01/19/2023 10:00 [name] [name] Medical Record Number: [id number] Patient Account Number: [id number] Date of Birth/Sex: Treating RN: 05/11/1956 (67 y.o. [name]) Primary Care Provider: Arva Chafe Other Clinician: Referring Provider: Treating Provider/Extender: [name] in Treatment: 20 Active Problems ICD-10 Encounter Code Description Active Date  MDM Diagnosis L97.512 Non-pressure chronic ulcer of other part of right foot with fat layer exposed 08/31/2022 No Yes I73.9 Peripheral vascular disease, unspecified 08/31/2022 No Yes I25.10 Atherosclerotic heart disease of native coronary artery without angina pectoris 08/31/2022 No Yes I10 Essential (primary) hypertension 08/31/2022 No Yes E11.65 Type 2 diabetes mellitus with hyperglycemia 08/31/2022 No Yes  E11.40 Type 2 diabetes mellitus with diabetic neuropathy, unspecified 08/31/2022 No Yes I63.9 Cerebral infarction, unspecified 08/31/2022 No Yes Inactive Problems Resolved Problems ICD-10 Code Description Active Date Resolved Date L97.821 Non-pressure chronic ulcer of other part of left lower leg limited to breakdown of skin 11/05/2022 11/05/2022 [name] [name], [name] [name] ([id number]) [phone].pdf Page 6 of 11 Electronic Signature(s) Signed: 01/19/2023 10:29:40 AM By: Duanne Guess MD FACS Entered By: Duanne Guess on 01/19/2023 10:29:40 -------------------------------------------------------------------------------- Progress Note Details Patient Name: Date of Service: [name] [name]. 01/19/2023 10:00 [name] [name] Medical Record Number: [id number] Patient Account Number: [id number] Date of Birth/Sex: Treating RN: 1956/07/17 (67 y.o. [name]) Primary Care Provider: Arva Chafe Other Clinician: Referring Provider: Treating Provider/Extender: [name] in Treatment: 20 Subjective Chief Complaint Information obtained from Patient 05/14/2020; patient is here for review of an abrasion injury on the left lateral calf 08/31/2022: [name] (1st metatarsal base, plantar) [name] of Present Illness (HPI) ADMISSION 05/14/2020; this is [name] 67 year old [name] with multiple medical issues. Predominantly he has type 2 diabetes with [name] [name] of peripheral neuropathy and also [name] of fairly significant PAD. He had [name] left superficial femoral to posterior tibial  artery bypass in February 2017 he also had an atherectomy and angioplasty by Dr. [name] of the right popliteal artery in 2016. He is supposed to be getting arterial studies annually however this was interrupted last year because of the pandemic. He tells [place] he was at [facility] 2 weeks ago was getting out of of the scooter and traumatized his left lateral lower leg. There was [name] as the patient is on Plavix and Eliquis. They have been dressing this with Neosporin and doing [name] fairly good job. Wound measures 2.5 x 3.5 it does not have any depth he does not have [name] wound [name] in his legs outside of surgery however he does have chronic edema and skin changes suggestive of chronic venous disease possibly some degree of lymphedema as well. Past medical [name] includes type 2 diabetes with peripheral neuropathy and gait instability, lumbar spondylosis, obstructive sleep apnea, [name] of [name], basal cell skin cancer, atrial fibrillation on anticoagulation, significant PAD as noted with [name] and [name] in 30th 2016. He also has [name] [name] of coronary artery disease with an MI in 2002 hypertension hyperlipidemia and heart failure with preserved ejection fraction His last arterial studies I can see in epic were on 03/10/2018 this showed [name] and [name] with monophasic waveforms on the right. On the left his ABI was 1.20 with [name] and triphasic waveforms. He has not had arterial studies since. Our nurse in the clinic got an ABI on the left of 1.1 11/2; left anterior leg wound in the setting of chronic venous insufficiency. Wound was initially trauma. We have been using Hydrofera Blue under compression he has home health.. The wound looks [name] lot better today with improvement in surface area 11/16; left anterior leg wound in the setting of  chronic venous insufficiency. Wound was initially trauma. We have been using Hydrofera Blue under compression. The patient is closed today. He is supposed to follow-up with vein and vascular with regards to arterial insufficiency nevertheless his leg wounds are 12/3; apparently 2 weeks ago when they were putting on their stockings they managed to get 3 wounds on the left  anterior lower leg from abrasion when putting on the stockings. Home health came by the week of Thanksgiving and put Hydrofera Blue 4-layer wrap on this and there is only one superficial area remaining. The patient and his wife complained about the difficulties getting stockings on I think we are using 20/30. We will order bilateral external compression stockings which should be easier. 12/10; wound on the left anterior lower leg is closed. He has chronic venous insufficiency we ordered him Farrow wrap stockings unfortunately he did not bring these in. 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 He says that it has been present since mid December. He is currently residing in [facility] until March and [name] podiatrist has been looking after him there. They have been simply painting the area with Betadine. He has not had any lower extremity arterial studies since 2019, at which time his right ABI was 0.69. Measured in clinic today, it was 0.71. He is not aware of his most recent hemoglobin A1c, but historically he has had exceptionally poor control. On the basis of his right first metatarsal, there is [name] small crescent shaped wound. There is surrounding eschar and callus. There is no malodor or purulent drainage. 09/08/2022: The original wound is smaller today and fairly clean, but there is some discoloration and [name] pulpy texture to the adjacent callus. Underneath this, the tissue is open exposing the fat layer. It looks like perhaps there was [name] crack in the callus and  moisture got under the skin and caused breakdown. 09/15/2022: There has been more moisture related tissue breakdown. The callus is very soft and the underlying tissue is more open. 09/28/2022: The wound looks much better. He has done [name] good job keeping it dry. There is some callus overlying much of the wound surface. There is slough on the exposed open areas. 10/14/2022: There has been [name] lot of moisture related tissue breakdown. He is still forming callus over the top and then it seems that moisture gets underneath the callus and causes tissue damage. There is slough on the exposed wound surface. They will be moving back to the local area [place] the [place] this weekend. 10/21/2022: His foot is less wet, but there is no significant change to his wound. He has developed [name] blister on his left anterior tibial surface. There is no open [name] [name], [name] [name] ([id number]) [phone].pdf Page 7 of 11 wound here, but he does have some fairly significant edema. We are planning to apply [name] total contact cast today. 10/23/2022: Here for his obligatory first cast change. He says he has not had any issues wearing the cast or walking in the boot. No detrimental effects on his wound. 10/29/2022: The wound is looking better. Clearly, putting him in the cast has prevented water from getting in under his callus and causing further tissue breakdown. The blister on his left anterior tibial surface has not yet ruptured. Edema control is significantly improved. 11/05/2022: The wound on his right plantar foot is getting better. There is still some callus accumulation around the wounds, but there are just 2 open areas now, [name] very small 1 on the medial aspect of his metatarsal head and [name] little bit larger 1 on the plantar surface. The blister on his left anterior tibial surface is now open with hanging dry skin. 11/12/2022: The left anterior tibial wound is closed. The wound on his right plantar foot is smaller with just [name]  little bit of callus around the edges. 11/19/2022: The right plantar foot wound continues to contract. The surface is quite clean. The tiny satellite area on the medial aspect of his foot has closed. 11/26/2022: The wound is smaller again today. He is responding well to the offloading effects of total contact casting. 12/03/2022: The wound is about the same size today. There is [name] little bit of moisture around the perimeter of the wound. No significant tissue breakdown. 12/10/2022: The wound is smaller today. There has been no moisture-related tissue breakdown of any maceration. 12/21/2022: The wound continues to contract. Moisture control is excellent. 12/28/2022: The wound is [name] little bit smaller today. 01/05/2023: The wound measures smaller today. Excellent moisture control with zero evidence of maceration. The wound surface is clean with healthy granulation tissue. 01/13/2023: We are leaving his Apligraf in situ for [name] second week. There is no discolored drainage or malodor coming from the site. 01/19/2023: The wound is smaller today. The periwound skin is in good condition without tissue maceration and the healed areas of tissue appear to have improved integrity. Patient [name] Information obtained from Patient. Family [name] Unknown [name]. Social [name] Never smoker, Marital Status - Single, Alcohol Use - Never, Drug Use - No [name], Caffeine Use - Rarely. Medical [name] Respiratory Patient has [name] of Sleep Apnea Cardiovascular Patient has [name] of Arrhythmia - [name]-Fib, Coronary Artery Disease - s/p CABG, Deep Vein Thrombosis, Hypertension, Myocardial Infarction, Peripheral Arterial Disease - s/p Fempop x 2 Endocrine Patient has [name] of Type II Diabetes Neurologic Patient has [name] of Neuropathy Hospitalization/Surgery [name] - colonoscopy. - polypectomy. - peripheral vascular cath. - shoulder arthroscopy. - carpal tunnel release. - coronary artery bypass. - appendectomy. -  cardiac cath. - coronary angioplasty. - thrombectomy. - knee arthroplasty. - popliteal artery stent. 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 no acute distress. Vitals Time Taken: 9:59 AM, Height: 70 in, Weight: 260 lbs, BMI: 37.3, Temperature: 98.1 F, Pulse: 77 bpm, Respiratory Rate: 20 breaths/min, Blood Pressure: 186/89 mmHg. Respiratory Normal work of breathing on room air. [name] [name], [name] [name] ([id number]) [id number] Page 8 of 11 General Notes: 01/19/2023: The wound is smaller today. The periwound skin is in good condition without tissue maceration and the healed areas of tissue appear to have improved integrity. Integumentary (Hair, Skin) Wound #2 status is Open. Original cause of wound was Gradually Appeared. The date acquired was: 07/08/2022. The wound has been in treatment 20 weeks. The wound is located on the Right,Plantar Foot. The wound measures 1cm length x 1.1cm width x 0.2cm depth; 0.864cm^2 area and 0.173cm^3 volume. There is Fat Layer (Subcutaneous Tissue) exposed. There is no tunneling or undermining noted. There is [name] medium amount of serosanguineous drainage noted. The wound margin is distinct with the outline attached to the wound base. There is large (67-100%) red granulation within the wound bed. There is [name] small (1-33%) amount of necrotic tissue within the wound bed including Adherent Slough. The periwound skin appearance had no abnormalities noted for color. The periwound skin appearance exhibited: Callus. The periwound skin appearance did not exhibit: Maceration. Periwound temperature was noted as No Abnormality. Assessment Active Problems ICD-10 Non-pressure chronic ulcer of other part of right foot with fat layer exposed Peripheral vascular disease, unspecified Atherosclerotic heart disease  of native coronary artery without angina pectoris Essential (primary) hypertension Type 2 diabetes mellitus with hyperglycemia Type 2 diabetes mellitus with diabetic neuropathy,  unspecified Cerebral infarction, unspecified Procedures Wound #2 Pre-procedure diagnosis of Wound #2 is [name] Diabetic Wound/Ulcer of the Lower Extremity located on the Right,Plantar Foot. [name] skin graft procedure using [name] bioengineered skin substitute/cellular or tissue based product was performed by Duanne Guess, MD with the following instrument(s): Forceps and Scissors. Epicord was applied and secured with Steri-Strips. 4 sq cm of product was utilized and 2 sq cm was wasted due to wound size. Post Application, adaptic was applied. [name] Time Out was conducted at 10:14, prior to the start of the procedure. The procedure was tolerated well with [name] pain level of 0 throughout and [name] pain level of 0 following the procedure. Post procedure Diagnosis Wound #2: Same as Pre-Procedure General Notes: scribed for Dr. Lady Gary by Samuella Bruin, RN. Pre-procedure diagnosis of Wound #2 is [name] Diabetic Wound/Ulcer of the Lower Extremity located on the Right,Plantar Foot . There was [name] T Contact Cast otal Procedure by Duanne Guess, MD. Post procedure Diagnosis Wound #2: Same as Pre-Procedure Notes: scribed for Dr. Lady Gary by Samuella Bruin, RN. Plan Follow-up Appointments: Return Appointment in 1 week. - Dr. Lady Gary - room 2 Anesthetic: (In clinic) Topical Lidocaine 4% applied to wound bed Cellular or Tissue Based Products: Wound #2 Right,Plantar Foot: Cellular or Tissue Based Product Type: - Epicord #1 Cellular or Tissue Based Product applied to wound bed, secured with steri-strips, cover with Adaptic or Mepitel. (DO NOT REMOVE). Bathing/ Shower/ Hygiene: May shower with protection but do not get wound dressing(s) wet. Protect dressing(s) with water repellant cover (for example, large plastic bag) or [name] cast cover and may then  take shower. Edema Control - Lymphedema / SCD / Other: Elevate legs to the level of the heart or above for 30 minutes daily and/or when sitting for 3-4 times [name] day throughout the day. Avoid standing for long periods of time. Patient to wear own compression stockings every day. Moisturize legs daily. Off-Loading: Removable cast walker boot to: - right leg The following medication(s) was prescribed: lidocaine topical 4 % cream cream topical was prescribed at facility WOUND #2: - Foot Wound Laterality: Plantar, Right Cleanser: Soap and Water 1 x Per Week/30 Days Discharge Instructions: May shower and wash wound with dial antibacterial soap and water prior to dressing change. Cleanser: Wound Cleanser 1 x Per Week/30 Days Discharge Instructions: Cleanse the wound with wound cleanser prior to applying [name] clean dressing using gauze sponges, not tissue or cotton balls. Peri-Wound Care: Zinc Oxide Ointment 30g tube 1 x Per Week/30 Days Discharge Instructions: Apply Zinc Oxide to periwound with each dressing change Prim Dressing: ADAPTIC TOUCH 3x4.25 (in/in) 1 x Per Week/30 Days ary Discharge Instructions: Apply to wound bed as instructed Secondary Dressing: Woven Gauze Sponge, Non-Sterile 4x4 in 1 x Per Week/30 Days Discharge Instructions: Apply over primary dressing as directed. Secondary Dressing: Zetuvit Plus 4x8 in 1 x Per Week/30 Days Discharge Instructions: Apply over primary dressing as directed. Secured With: [employer], 4.5x3.1 (in/yd) 1 x Per Week/30 Days [name] [name], [name] [name] ([id number]) [phone].pdf Page 9 of 11 Discharge Instructions: Secure with Kerlix as directed. 01/19/2023: The wound is smaller today. The periwound skin is in good condition without tissue maceration and the healed areas of tissue appear to have improved integrity. He has been approved for Epicord. The wound bed was prepared to receive the skin substitute. Epicord was rehydrated and applied  in standard fashion. Zinc oxide was applied to the periwound and everything was secured in place with Adaptic and Steri-Strips. [name] total  contact cast was then applied in standard fashion. He will follow-up in 1 week. Electronic Signature(s) Signed: 01/19/2023 10:39:52 AM By: Duanne Guess MD FACS Entered By: Duanne Guess on 01/19/2023 10:39:52 -------------------------------------------------------------------------------- HxROS Details Patient Name: Date of Service: [name] [name]. 01/19/2023 10:00 [name] [name] Medical Record Number: [id number] Patient Account Number: [id number] Date of Birth/Sex: Treating RN: Nov 30, 1955 (67 y.o. [name]) Primary Care Provider: Arva Chafe Other Clinician: Referring Provider: Treating Provider/Extender: [name] in Treatment: 20 Information Obtained From Patient Respiratory Medical [name]: Positive for: Sleep Apnea Cardiovascular Medical [name]: Positive for: Arrhythmia - [name]-Fib; Coronary Artery Disease - s/p CABG; Deep Vein Thrombosis; Hypertension; Myocardial Infarction; Peripheral Arterial Disease - s/p Fempop x 2 Past Medical [name] Notes: CVA x 3 Endocrine Medical [name]: Positive for: Type II Diabetes Time with diabetes: since 1997 Treated with: Insulin, Oral agents Blood sugar tested every day: Yes Tested : 2-3x per day Musculoskeletal Medical [name]: Past Medical [name] Notes: carpal tunnel syndrome Neurologic Medical [name]: Positive for: Neuropathy Past Medical [name] Notes: stroke Oncologic Medical [name]: Past Medical [name] Notes: skin cancer [name] [name], [name] [name] ([id number]) [id number] Page 10 of 11 Immunizations Pneumococcal Vaccine: Received Pneumococcal Vaccination: Yes Received Pneumococcal Vaccination On or After 60th Birthday: Yes Implantable Devices None Hospitalization / Surgery [name] Type of  Hospitalization/Surgery colonoscopy polypectomy peripheral vascular cath shoulder arthroscopy carpal tunnel release coronary artery bypass appendectomy cardiac cath coronary angioplasty thrombectomy knee arthroplasty popliteal artery stent Family and Social [name] Unknown [name]: Yes; Never smoker; Marital Status - Single; Alcohol Use: Never; Drug Use: No [name]; Caffeine Use: Rarely; Financial Concerns: No; Food, Clothing or Shelter Needs: No; Support System Lacking: No; Transportation Concerns: No Electronic Signature(s) Signed: 01/19/2023 10:50:39 AM By: Duanne Guess MD FACS Entered By: Duanne Guess on 01/19/2023 10:37:20 -------------------------------------------------------------------------------- Total Contact Cast Details Patient Name: Date of Service: [name] [name]. 01/19/2023 10:00 [name] [name] Medical Record Number: [id number] Patient Account Number: [id number] Date of Birth/Sex: Treating RN: 1956-01-05 (67 y.o. [name] Primary Care Provider: Arva Chafe Other Clinician: Referring Provider: Treating Provider/Extender: [name] in Treatment: 20 T Contact Cast Applied for Wound Assessment: otal Wound #2 Right,Plantar Foot Performed By: Physician Duanne Guess, MD Post Procedure Diagnosis Same as Pre-procedure Notes scribed for Dr. Lady Gary by Samuella Bruin, RN Electronic Signature(s) Signed: 01/19/2023 10:50:39 AM By: Duanne Guess MD FACS Signed: 01/19/2023 3:06:27 PM By: Samuella Bruin Entered By: Samuella Bruin on 01/19/2023 10:15:49 -------------------------------------------------------------------------------- SuperBill Details Patient Name: Date of Service: [name] [name]. 01/19/2023 Medical Record Number: [id number] Patient Account Number: [id number] [name] [name], PILTZ [name] ([id number]) [phone].pdf Page 11 of 11 Date of Birth/Sex: Treating RN: 1955-12-15 (67 y.o. [name]) Primary  Care Provider: Arva Chafe Other Clinician: Referring Provider: Treating Provider/Extender: [name] in Treatment: 20 Diagnosis Coding ICD-10 Codes Code Description [phone] Non-pressure chronic ulcer of other part of right foot with fat layer exposed I73.9 Peripheral vascular disease, unspecified I25.10 Atherosclerotic heart disease of native coronary artery without angina pectoris I10 Essential (primary) hypertension E11.65 Type 2 diabetes mellitus with hyperglycemia E11.40 Type 2 diabetes mellitus with diabetic neuropathy, unspecified I63.9 Cerebral infarction, unspecified Facility Procedures : CPT4 Code: 42706237 Description: Q4187 Epicord 2cm x 3cm - per sqcm Modifier: Quantity: 6 : CPT4 Code: 62831517 Description: 15275 - SKIN SUB GRAFT FACE/NK/HF/G ICD-10 Diagnosis Description L97.512 Non-pressure chronic ulcer of other part of right foot with fat layer exposed Modifier: Quantity: 1 : CPT4 Code: 61607371 Description: 06269 - APPLY TOTAL CONTACT LEG CAST  ICD-10 Diagnosis Description L97.512 Non-pressure chronic ulcer of other part of right foot with fat layer exposed Modifier: Quantity: 1 Physician Procedures : CPT4 Code Description Modifier [id number] - WC PHYS LEVEL 3 - EST PT 25 ICD-10 Diagnosis Description L97.512 Non-pressure chronic ulcer of other part of right foot with fat layer exposed I73.9 Peripheral vascular disease, unspecified E11.40 Type 2  diabetes mellitus with diabetic neuropathy, unspecified E11.65 Type 2 diabetes mellitus with hyperglycemia Quantity: 1 : [id number] 15275 - WC PHYS SKIN SUB GRAFT FACE/NK/HF/G ICD-10 Diagnosis Description L97.512 Non-pressure chronic ulcer of other part of right foot with fat layer exposed Quantity: 1 : [id number] - WC PHYS APPLY TOTAL CONTACT CAST ICD-10 Diagnosis Description L97.512 Non-pressure chronic ulcer of other part of right foot with fat layer exposed Quantity:  1 Electronic Signature(s) Signed: 01/19/2023 10:40:19 AM By: Duanne Guess MD FACS Entered By: Duanne Guess on 01/19/2023 10:40:19

## 2023-01-22 ENCOUNTER — Ambulatory Visit: Payer: Medicare HMO | Admitting: Gastroenterology

## 2023-01-27 ENCOUNTER — Encounter (HOSPITAL_BASED_OUTPATIENT_CLINIC_OR_DEPARTMENT_OTHER): Payer: Medicare HMO | Admitting: General Surgery

## 2023-01-27 DIAGNOSIS — E1151 Type 2 diabetes mellitus with diabetic peripheral angiopathy without gangrene: Secondary | ICD-10-CM | POA: Diagnosis not present

## 2023-01-27 DIAGNOSIS — E1140 Type 2 diabetes mellitus with diabetic neuropathy, unspecified: Secondary | ICD-10-CM | POA: Diagnosis not present

## 2023-01-27 DIAGNOSIS — I110 Hypertensive heart disease with heart failure: Secondary | ICD-10-CM | POA: Diagnosis not present

## 2023-01-27 DIAGNOSIS — N1831 Chronic kidney disease, stage 3a: Secondary | ICD-10-CM | POA: Diagnosis not present

## 2023-01-27 DIAGNOSIS — Z7902 Long term (current) use of antithrombotics/antiplatelets: Secondary | ICD-10-CM | POA: Diagnosis not present

## 2023-01-27 DIAGNOSIS — G4733 Obstructive sleep apnea (adult) (pediatric): Secondary | ICD-10-CM | POA: Diagnosis not present

## 2023-01-27 DIAGNOSIS — I10 Essential (primary) hypertension: Secondary | ICD-10-CM | POA: Diagnosis not present

## 2023-01-27 DIAGNOSIS — E1165 Type 2 diabetes mellitus with hyperglycemia: Secondary | ICD-10-CM | POA: Diagnosis not present

## 2023-01-27 DIAGNOSIS — I2510 Atherosclerotic heart disease of native coronary artery without angina pectoris: Secondary | ICD-10-CM | POA: Diagnosis not present

## 2023-01-27 DIAGNOSIS — Z7901 Long term (current) use of anticoagulants: Secondary | ICD-10-CM | POA: Diagnosis not present

## 2023-01-27 DIAGNOSIS — Z85828 Personal history of other malignant neoplasm of skin: Secondary | ICD-10-CM | POA: Diagnosis not present

## 2023-01-27 DIAGNOSIS — E7800 Pure hypercholesterolemia, unspecified: Secondary | ICD-10-CM | POA: Diagnosis not present

## 2023-01-27 DIAGNOSIS — L97519 Non-pressure chronic ulcer of other part of right foot with unspecified severity: Secondary | ICD-10-CM | POA: Diagnosis not present

## 2023-01-27 DIAGNOSIS — I4891 Unspecified atrial fibrillation: Secondary | ICD-10-CM | POA: Diagnosis not present

## 2023-01-27 DIAGNOSIS — I5032 Chronic diastolic (congestive) heart failure: Secondary | ICD-10-CM | POA: Diagnosis not present

## 2023-01-27 DIAGNOSIS — L97512 Non-pressure chronic ulcer of other part of right foot with fat layer exposed: Secondary | ICD-10-CM | POA: Diagnosis not present

## 2023-01-27 DIAGNOSIS — E11621 Type 2 diabetes mellitus with foot ulcer: Secondary | ICD-10-CM | POA: Diagnosis not present

## 2023-01-27 NOTE — Progress Notes (Signed)
Daws, Remy A ([id number]) [id number] Page 1 of 7 Visit Report for 01/27/2023 Arrival Information Details Patient Name: Date of Service: [place], [name] 01/27/2023 10:00 A M Medical Record Number: [id number] Patient Account Number: [id number] Date of Birth/Sex: Treating RN: 21-Dec-1955 (67 y.o. M) Primary Care Calliope Delangel: Arva Chafe Other Clinician: Referring Oceania Noori: Treating Ziyad Dyar/Extender: [name] in Treatment: 21 Visit Information History Since Last Visit All ordered tests and consults were completed: No Patient Arrived: Ambulatory Added or deleted any medications: No Arrival Time: 09:43 Any new allergies or adverse reactions: No Accompanied By: friend Had a fall or experienced change in No Transfer Assistance: None activities of daily living that may affect Patient Identification Verified: Yes risk of falls: Secondary Verification Process Completed: Yes Signs or symptoms of abuse/neglect since last visito No Patient Requires Transmission-Based Precautions: No Hospitalized since last visit: No Patient Has Alerts: No Implantable device outside of the clinic excluding No cellular tissue based products placed in the center since last visit: Pain Present Now: No Electronic Signature(s) Signed: 01/27/2023 10:07:43 AM By: Dayton Scrape Entered By: Dayton Scrape on 01/27/2023 09:43:46 -------------------------------------------------------------------------------- Encounter Discharge Information Details Patient Name: Date of Service: [place], [name] 01/27/2023 10:00 A M Medical Record Number: [id number] Patient Account Number: [id number] Date of Birth/Sex: Treating RN: August 22, 1955 (67 y.o. M) Primary Care Destry Dauber: Arva Chafe Other Clinician: Referring Moya Duan: Treating Jaspreet Hollings/Extender: [name] in Treatment: 21 Encounter Discharge Information Items Post Procedure  Vitals Discharge Condition: Stable Temperature (F): 98.8 Ambulatory Status: Ambulatory Pulse (bpm): 64 Discharge Destination: Home Respiratory Rate (breaths/min): 20 Transportation: Private Auto Blood Pressure (mmHg): 174/90 Accompanied By: self Schedule Follow-up Appointment: Yes Clinical Summary of Care: Patient Declined Electronic Signature(s) Signed: 01/27/2023 4:26:01 PM By: Samuella Bruin Entered By: Samuella Bruin on 01/27/2023 10:15:08 Courtney, Kaniel A ([id number]) [id number] Page 2 of 7 -------------------------------------------------------------------------------- Lower Extremity Assessment Details Patient Name: Date of Service: [place], [name] 01/27/2023 10:00 A M Medical Record Number: [id number] Patient Account Number: [id number] Date of Birth/Sex: Treating RN: 11-13-1955 (67 y.o. M) Primary Care Amedeo Detweiler: Arva Chafe Other Clinician: Referring Rakeya Glab: Treating Kelina Beauchamp/Extender: [name] in Treatment: 21 Edema Assessment Assessed: [Left: No] [Right: No] Edema: [Left: Ye] [Right: s] Calf Left: Right: Point of Measurement: From Medial Instep 37 cm Ankle Left: Right: Point of Measurement: From Medial Instep 25.5 cm Vascular Assessment Pulses: Dorsalis Pedis Palpable: [Right:Yes] Extremity colors, hair growth, and conditions: Extremity Color: [Right:Normal] Hair Growth on Extremity: [Right:Yes] Temperature of Extremity: [Right:Warm No] Toe Nail Assessment Left: Right: Thick: Yes Discolored: Yes Electronic Signature(s) Signed: 01/27/2023 4:26:01 PM By: Samuella Bruin Entered By: Samuella Bruin on 01/27/2023 10:14:14 -------------------------------------------------------------------------------- Multi Wound Chart Details Patient Name: Date of Service: [name], [name] 01/27/2023 10:00 A M Medical Record Number: [id number] Patient Account Number: [id number] Date of Birth/Sex:  Treating RN: Sep 08, 1955 (67 y.o. M) Primary Care Lennis Korb: Arva Chafe Other Clinician: Referring Jenay Morici: Treating Shakeerah Gradel/Extender: [name] in Treatment: 21 Vital Signs Height(in): 70 Pulse(bpm): 64 Weight(lbs): 260 Blood Pressure(mmHg): 174/90 Body Mass Index(BMI): 37.3 Temperature(F): 98.8 Respiratory Rate(breaths/min): 20 [Novak, Caulin A ([id number]):Wound Assessments] [[id number] Page 3 of [phone].pdf Page 3 of 7] Wound Number: 2 N/A N/A Photos: N/A N/A Right, Plantar Foot N/A N/A Wound Location: Gradually Appeared N/A N/A Wounding Event: Diabetic Wound/Ulcer of the Lower N/A N/A Primary Etiology: Extremity Sleep Apnea, Arrhythmia, Coronary N/A N/A Comorbid History: Artery Disease, Deep Vein Thrombosis, Hypertension, Myocardial Infarction, Peripheral Arterial Disease, Type II Diabetes,  Neuropathy 07/08/2022 N/A N/A Date Acquired: 46 N/A N/A Weeks of Treatment: Open N/A N/A Wound Status: No N/A N/A Wound Recurrence: 0.8x1.1x0.2 N/A N/A Measurements L x W x D (cm) 0.691 N/A N/A A (cm) : rea 0.138 N/A N/A Volume (cm) : 72.90% N/A N/A % Reduction in A rea: 45.90% N/A N/A % Reduction in Volume: Grade 1 N/A N/A Classification: Medium N/A N/A Exudate A mount: Serosanguineous N/A N/A Exudate Type: red, brown N/A N/A Exudate Color: Distinct, outline attached N/A N/A Wound Margin: Large (67-100%) N/A N/A Granulation A mount: Red N/A N/A Granulation Quality: Small (1-33%) N/A N/A Necrotic A mount: Fat Layer (Subcutaneous Tissue): Yes N/A N/A Exposed Structures: Fascia: No Tendon: No Muscle: No Joint: No Bone: No Medium (34-66%) N/A N/A Epithelialization: Callus: Yes N/A N/A Periwound Skin Texture: Maceration: No N/A N/A Periwound Skin Moisture: No Abnormalities Noted N/A N/A Periwound Skin Color: No Abnormality N/A N/A Temperature: Cellular or  Tissue Based Product N/A N/A Procedures Performed: T Contact Cast otal Treatment Notes Wound #2 (Foot) Wound Laterality: Plantar, Right Cleanser Soap and Water Discharge Instruction: May shower and wash wound with dial antibacterial soap and water prior to dressing change. Wound Cleanser Discharge Instruction: Cleanse the wound with wound cleanser prior to applying a clean dressing using gauze sponges, not tissue or cotton balls. Peri-Wound Care Zinc Oxide Ointment 30g tube Discharge Instruction: Apply Zinc Oxide to periwound with each dressing change Topical Primary Dressing ADAPTIC TOUCH 3x4.25 (in/in) Discharge Instruction: Apply to wound bed as instructed Secondary Dressing Woven Gauze Sponge, Non-Sterile 4x4 in Discharge Instruction: Apply over primary dressing as directed. Zetuvit Plus 4x8 in Discharge Instruction: Apply over primary dressing as directed. Secured With [employer], 4.5x3.1 (in/yd) Discharge Instruction: Secure with Kerlix as directed. Hodo, Haidar A ([id number]) [id number] Page 4 of 7 Compression Wrap Compression Stockings Add-Ons Electronic Signature(s) Signed: 01/27/2023 10:40:48 AM By: Duanne Guess MD FACS Entered By: Duanne Guess on 01/27/2023 10:40:47 -------------------------------------------------------------------------------- Multi-Disciplinary Care Plan Details Patient Name: Date of Service: [name], [name] 01/27/2023 10:00 A M Medical Record Number: [id number] Patient Account Number: [id number] Date of Birth/Sex: Treating RN: 06-22-1956 (67 y.o. M) Primary Care Christan Defranco: Arva Chafe Other Clinician: Referring Nyesha Cliff: Treating Rocco Kerkhoff/Extender: [name] in Treatment: 21 Multidisciplinary Care Plan reviewed with physician Active Inactive Abuse / Safety / Falls / Self Care Management Nursing Diagnoses: Impaired physical mobility Potential for  falls Goals: Patient will not experience any injury related to falls Date Initiated: 08/31/2022 Target Resolution Date: 02/19/2023 Goal Status: Active Patient/caregiver will verbalize/demonstrate measures taken to improve the patient's personal safety Date Initiated: 08/31/2022 Target Resolution Date: 02/19/2023 Goal Status: Active Interventions: Provide education on basic hygiene Provide education on fall prevention Notes: Electronic Signature(s) Signed: 01/27/2023 4:26:01 PM By: Samuella Bruin Entered By: Samuella Bruin on 01/27/2023 10:14:47 -------------------------------------------------------------------------------- Pain Assessment Details Patient Name: Date of Service: [name], [name] 01/27/2023 10:00 A M Medical Record Number: [id number] Patient Account Number: [id number] Date of Birth/Sex: Treating RN: 1955-10-31 (67 y.o. M) Primary Care Jamiesha Victoria: Arva Chafe Other Clinician: Referring Dayln Tugwell: Treating Alycea Segoviano/Extender: [name] in Treatment: [address], [place] A ([id number]) [id number] Page 5 of 7 Active Problems Location of Pain Severity and Description of Pain Patient Has Paino No Site Locations Pain Management and Medication Current Pain Management: Electronic Signature(s) Signed: 01/27/2023 10:07:43 AM By: Dayton Scrape Entered By: Dayton Scrape on 01/27/2023 09:44:13 -------------------------------------------------------------------------------- Patient/Caregiver Education Details Patient Name: Date of Service: [place], [name] 7/10/2024andnbsp10:00 A M  Medical Record Number: [id number] Patient Account Number: [id number] Date of Birth/Gender: Treating RN: Mar 22, 1956 (67 y.o. M) Primary Care Physician: Arva Chafe Other Clinician: Referring Physician: Treating Physician/Extender: [name] in Treatment: 21 Education Assessment Education Provided  To: Patient Education Topics Provided Wound/Skin Impairment: Methods: Explain/Verbal Responses: Reinforcements needed, State content correctly [employer]) Signed: 01/27/2023 4:26:01 PM By: Samuella Bruin Entered By: Samuella Bruin on 01/27/2023 10:14:55 Jelinski, Icker A ([id number]) [id number] Page 6 of 7 -------------------------------------------------------------------------------- Wound Assessment Details Patient Name: Date of Service: [place], [name] 01/27/2023 10:00 A M Medical Record Number: [id number] Patient Account Number: [id number] Date of Birth/Sex: Treating RN: October 01, 1955 (67 y.o. M) Primary Care Lanice Folden: Arva Chafe Other Clinician: Referring Trew Sunde: Treating Ajwa Kimberley/Extender: [name] in Treatment: 21 Wound Status Wound Number: 2 Primary Diabetic Wound/Ulcer of the Lower Extremity Etiology: Wound Location: Right, Plantar Foot Wound Open Wounding Event: Gradually Appeared Status: Date Acquired: 07/08/2022 Comorbid Sleep Apnea, Arrhythmia, Coronary Artery Disease, Deep Vein Weeks Of Treatment: 21 History: Thrombosis, Hypertension, Myocardial Infarction, Peripheral Arterial Clustered Wound: No Disease, Type II Diabetes, Neuropathy Photos Wound Measurements Length: (cm) 0.8 Width: (cm) 1.1 Depth: (cm) 0.2 Area: (cm) 0.691 Volume: (cm) 0.138 % Reduction in Area: 72.9% % Reduction in Volume: 45.9% Epithelialization: Medium (34-66%) Tunneling: No Undermining: No Wound Description Classification: Grade 1 Wound Margin: Distinct, outline attached Exudate Amount: Medium Exudate Type: Serosanguineous Exudate Color: red, brown Foul Odor After Cleansing: No Slough/Fibrino Yes Wound Bed Granulation Amount: Large (67-100%) Exposed Structure Granulation Quality: Red Fascia Exposed: No Necrotic Amount: Small (1-33%) Fat Layer (Subcutaneous Tissue) Exposed: Yes Necrotic Quality:  Adherent Slough Tendon Exposed: No Muscle Exposed: No Joint Exposed: No Bone Exposed: No Periwound Skin Texture Texture Color No Abnormalities Noted: No No Abnormalities Noted: Yes Callus: Yes Temperature / Pain Temperature: No Abnormality Moisture No Abnormalities Noted: No Maceration: No Treatment Notes Wound #2 (Foot) Wound Laterality: Plantar, Right Cleanser Soap and Water Discharge Instruction: May shower and wash wound with dial antibacterial soap and water prior to dressing change. Wound Cleanser Discharge Instruction: Cleanse the wound with wound cleanser prior to applying a clean dressing using gauze sponges, not tissue or cotton balls. Aguado, Matheo A ([id number]) [id number] Page 7 of 7 Peri-Wound Care Zinc Oxide Ointment 30g tube Discharge Instruction: Apply Zinc Oxide to periwound with each dressing change Topical Primary Dressing ADAPTIC TOUCH 3x4.25 (in/in) Discharge Instruction: Apply to wound bed as instructed Secondary Dressing Woven Gauze Sponge, Non-Sterile 4x4 in Discharge Instruction: Apply over primary dressing as directed. Zetuvit Plus 4x8 in Discharge Instruction: Apply over primary dressing as directed. Secured With [employer], 4.5x3.1 (in/yd) Discharge Instruction: Secure with Kerlix as directed. Compression Wrap Compression Stockings Add-Ons Electronic Signature(s) Signed: 01/27/2023 4:26:01 PM By: Samuella Bruin Previous Signature: 01/27/2023 10:07:43 AM Version By: Dayton Scrape Entered By: Samuella Bruin on 01/27/2023 10:14:21 -------------------------------------------------------------------------------- Vitals Details Patient Name: Date of Service: [name], [name] 01/27/2023 10:00 A M Medical Record Number: [id number] Patient Account Number: [id number] Date of Birth/Sex: Treating RN: May 07, 1956 (67 y.o. M) Primary Care Aven Cegielski: Arva Chafe Other Clinician: Referring Kc Sedlak: Treating  Charliegh Vasudevan/Extender: [name] in Treatment: 21 Vital Signs Time Taken: 09:43 Temperature (F): 98.8 Height (in): 70 Pulse (bpm): 64 Weight (lbs): 260 Respiratory Rate (breaths/min): 20 Body Mass Index (BMI): 37.3 Blood Pressure (mmHg): 174/90 Reference Range: 80 - 120 mg / dl Electronic Signature(s) Signed: 01/27/2023 10:07:43 AM By: Dayton Scrape Entered By: Dayton Scrape on 01/27/2023 09:44:08

## 2023-01-27 NOTE — Progress Notes (Signed)
[name], [name] ([id number]) [id number] Page 1 of 11 Visit Report for 01/27/2023 Chief Complaint Document Details Patient Name: Date of Service: [name], [name]. 01/27/2023 10:00 [name] Medical Record Number: [id number] Patient Account Number: [id number] Date of Birth/Sex: Treating RN: Nov 02, 1955 (67 y.o. M) Primary Care Provider: Arva Chafe Other Clinician: Referring Provider: Treating Provider/Extender: [name] in Treatment: 21 Information Obtained from: Patient Chief Complaint 05/14/2020; patient is here for review of an abrasion injury on the left lateral calf 08/31/2022: [name] (1st metatarsal base, plantar) Electronic Signature(s) Signed: 01/27/2023 10:41:11 AM By: Duanne Guess MD FACS Entered By: Duanne Guess on 01/27/2023 10:41:11 -------------------------------------------------------------------------------- Cellular or Tissue Based Product Details Patient Name: Date of Service: [name], [name]. 01/27/2023 10:00 [name] Medical Record Number: [id number] Patient Account Number: [id number] Date of Birth/Sex: Treating RN: Mar 25, 1956 (67 y.o. M) Primary Care Provider: Arva Chafe Other Clinician: Referring Provider: Treating Provider/Extender: [name] in Treatment: 21 Cellular or Tissue Based Product Type Wound #2 Right,Plantar Foot Applied to: Performed By: Physician Duanne Guess, MD Cellular or Tissue Based Product Type: Epicord Level of Consciousness (Pre-procedure): Awake and Alert Pre-procedure Verification/Time Out Yes - 10:07 Taken: Location: genitalia / hands / feet / multiple digits Wound Size (sq cm): 0.88 Product Size (sq cm): 6 Waste Size (sq cm): 3 Waste Reason: wound size Amount of Product Applied (sq cm): 3 Instrument Used: Forceps, Scissors Lot #: [id number] Expiration Date: 11/18/2026 Fenestrated: No Reconstituted: Yes Solution  Type: normal saline Solution Amount: 8 ml Lot #: [id number] [place] Solution Expiration Date: 08/10/2024 Secured: Yes Secured With: Steri-Strips Dressing Applied: Yes Primary Dressing: adaptic Procedural Pain: 0 Post Procedural Pain: 0 [name], [name] ([id number]) [phone].pdf Page 2 of 11 Response to Treatment: Procedure was tolerated well Level of Consciousness (Post- Awake and Alert procedure): Post Procedure Diagnosis Same as Pre-procedure Notes scribed for Dr. Lady Gary by Samuella Bruin, RN Electronic Signature(s) Signed: 01/27/2023 10:41:05 AM By: Duanne Guess MD FACS Entered By: Duanne Guess on 01/27/2023 10:41:05 -------------------------------------------------------------------------------- HPI Details Patient Name: Date of Service: [name], [name]. 01/27/2023 10:00 [name] Medical Record Number: [id number] Patient Account Number: [id number] Date of Birth/Sex: Treating RN: 1955-11-21 (67 y.o. M) Primary Care Provider: Arva Chafe Other Clinician: Referring Provider: Treating Provider/Extender: [name] in Treatment: 21 History of Present Illness HPI Description: ADMISSION 05/14/2020; this is [name] with multiple medical issues. Predominantly he has type 2 diabetes with [name] and also history of fairly significant PAD. He had [name] in February 2017 he also had an atherectomy and angioplasty by Dr. [name] of the right popliteal artery in 2016. He is supposed to be getting arterial studies annually however this was interrupted last year because of the pandemic. He tells [place] he was at [facility] 2 weeks ago was getting out of of the scooter and traumatized his left lateral lower leg. There was [name] as the patient is on Plavix and Eliquis. They have been dressing this with Neosporin and doing [name] fairly good job. Wound  measures 2.5 x 3.5 it does not have any depth he does not have [name] in his legs outside of surgery however he does have chronic edema and skin changes suggestive of chronic venous disease possibly some degree of lymphedema as well. Past medical history includes type 2 diabetes with peripheral neuropathy and gait instability, lumbar spondylosis, obstructive sleep  apnea, history of [name], basal cell skin cancer, atrial fibrillation on anticoagulation, significant PAD as noted with [name] and [name] in 30th 2016. He also has [name] of coronary artery disease with an MI in 2002 hypertension hyperlipidemia and heart failure with preserved ejection fraction His last arterial studies I can see in epic were on 03/10/2018 this showed [name] and [name] with monophasic waveforms on the right. On the left his ABI was 1.20 with [name] and triphasic waveforms. He has not had arterial studies since. Our nurse in the clinic got an ABI on the left of 1.1 11/2; left anterior leg wound in the setting of chronic venous insufficiency. Wound was initially trauma. We have been using Hydrofera Blue under compression he has home health.. The wound looks [name] lot better today with improvement in surface area 11/16; left anterior leg wound in the setting of chronic venous insufficiency. Wound was initially trauma. We have been using Hydrofera Blue under compression. The patient is closed today. He is supposed to follow-up with vein and vascular with regards to arterial insufficiency nevertheless his leg wounds are 12/3; apparently 2 weeks ago when they were putting on their stockings they managed to get 3 wounds on the left anterior lower leg from abrasion when putting on the stockings. Home health came by the week of Thanksgiving and put Hydrofera Blue  4-layer wrap on this and there is only one superficial area remaining. The patient and his wife complained about the difficulties getting stockings on I think we are using 20/30. We will order bilateral external compression stockings which should be easier. 12/10; wound on the left anterior lower leg is closed. He has chronic venous insufficiency we ordered him Farrow wrap stockings unfortunately he did not bring these in. 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 He says that it has been present since mid December. He is currently residing in [facility] until March and [name] podiatrist has been looking after him there. They have been simply painting the area with Betadine. He has not had any lower extremity arterial studies since 2019, at which time his right ABI was 0.69. Measured in clinic today, it was 0.71. He is not aware of his most recent hemoglobin A1c, but historically he has had exceptionally poor control. On the basis of his right first metatarsal, there is [name] small crescent shaped wound. There is surrounding eschar and callus. There is no malodor or purulent drainage. 09/08/2022: The original wound is smaller today and fairly clean, but there is some discoloration and [name] pulpy texture to the adjacent callus. Underneath this, the tissue is open exposing the fat layer. It looks like perhaps there was [name] crack in the callus and moisture got under the skin and caused breakdown. 09/15/2022: There has been more moisture related tissue breakdown. The callus is very soft and the underlying tissue is more open. 09/28/2022: The wound looks much better. He has done [name] good job keeping it dry. There is some callus overlying much of the wound surface. There is slough on the exposed open areas. [name], [name] ([id number]) [id number] Page 3 of 11 10/14/2022: There has been [name] lot of moisture related tissue breakdown.  He is still forming callus over the top and then  it seems that moisture gets underneath the callus and causes tissue damage. There is slough on the exposed wound surface. They will be moving back to the local area from the beach this weekend. 10/21/2022: His foot is less wet, but there is no significant change to his wound. He has developed [name] blister on his left anterior tibial surface. There is no open wound here, but he does have some fairly significant edema. We are planning to apply [name] total contact cast today. 10/23/2022: Here for his obligatory first cast change. He says he has not had any issues wearing the cast or walking in the boot. No detrimental effects on his wound. 10/29/2022: The wound is looking better. Clearly, putting him in the cast has prevented water from getting in under his callus and causing further tissue breakdown. The blister on his left anterior tibial surface has not yet ruptured. Edema control is significantly improved. 11/05/2022: The wound on his right plantar foot is getting better. There is still some callus accumulation around the wounds, but there are just 2 open areas now, [name] very small 1 on the medial aspect of his metatarsal head and [name] little bit larger 1 on the plantar surface. The blister on his left anterior tibial surface is now open with hanging dry skin. 11/12/2022: The left anterior tibial wound is closed. The wound on his right plantar foot is smaller with just [name] little bit of callus around the edges. 11/19/2022: The right plantar foot wound continues to contract. The surface is quite clean. The tiny satellite area on the medial aspect of his foot has closed. 11/26/2022: The wound is smaller again today. He is responding well to the offloading effects of total contact casting. 12/03/2022: The wound is about the same size today. There is [name] little bit of moisture around the perimeter of the wound. No significant tissue breakdown. 12/10/2022: The wound is smaller today.  There has been no moisture-related tissue breakdown of any maceration. 12/21/2022: The wound continues to contract. Moisture control is excellent. 12/28/2022: The wound is [name] little bit smaller today. 01/05/2023: The wound measures smaller today. Excellent moisture control with zero evidence of maceration. The wound surface is clean with healthy granulation tissue. 01/13/2023: We are leaving his Apligraf in situ for [name] second week. There is no discolored drainage or malodor coming from the site. 01/19/2023: The wound is smaller today. The periwound skin is in good condition without tissue maceration and the healed areas of tissue appear to have improved integrity. 01/27/2023: Continued contraction of the wound. No tissue maceration. The wound surface is flush with the surrounding skin. Electronic Signature(s) Signed: 01/27/2023 10:41:58 AM By: Duanne Guess MD FACS Entered By: Duanne Guess on 01/27/2023 10:41:58 -------------------------------------------------------------------------------- Physical Exam Details Patient Name: Date of Service: [name], [name]. 01/27/2023 10:00 [name] Medical Record Number: [id number] Patient Account Number: [id number] Date of Birth/Sex: Treating RN: 21-May-1956 (67 y.o. M) Primary Care Provider: Arva Chafe Other Clinician: Referring Provider: Treating Provider/Extender: [name] in Treatment: 21 Constitutional Hypertensive, asymptomatic. . . . no acute distress. Respiratory Normal work of breathing on room air. Notes 01/27/2023: Continued contraction of the wound. No tissue maceration. The wound surface is flush with the surrounding skin. Electronic Signature(s) Signed: 01/27/2023 10:42:28 AM By: Duanne Guess MD FACS Entered By: Duanne Guess on 01/27/2023 10:42:28 [name], [name] ([id number]) [id number] Page 4 of  11 -------------------------------------------------------------------------------- Physician Orders Details Patient Name: Date of Service: [name], [name]. 01/27/2023 10:00 [name]  Medical Record Number: [id number] Patient Account Number: [id number] Date of Birth/Sex: Treating RN: Nov 10, 1955 (67 y.o. M) Primary Care Provider: Arva Chafe Other Clinician: Referring Provider: Treating Provider/Extender: [name] in Treatment: 21 Verbal / Phone Orders: No Diagnosis Coding ICD-10 Coding Code Description L97.512 Non-pressure chronic ulcer of other part of right foot with fat layer exposed I73.9 Peripheral vascular disease, unspecified I25.10 Atherosclerotic heart disease of native coronary artery without angina pectoris I10 Essential (primary) hypertension E11.65 Type 2 diabetes mellitus with hyperglycemia E11.40 Type 2 diabetes mellitus with diabetic neuropathy, unspecified I63.9 Cerebral infarction, unspecified Follow-up Appointments ppointment in 1 week. - Dr. Lady Gary - room 2 Return [name] Anesthetic (In clinic) Topical Lidocaine 4% applied to wound bed Cellular or Tissue Based Products Wound #2 Right,Plantar Foot Cellular or Tissue Based Product Type: - Epicord #2 Cellular or Tissue Based Product applied to wound bed, secured with steri-strips, cover with Adaptic or Mepitel. (DO NOT REMOVE). Bathing/ Shower/ Hygiene May shower with protection but do not get wound dressing(s) wet. Protect dressing(s) with water repellant cover (for example, large plastic bag) or [name] cast cover and may then take shower. Edema Control - Lymphedema / SCD / Other Elevate legs to the level of the heart or above for 30 minutes daily and/or when sitting for 3-4 times [name] day throughout the day. Avoid standing for long periods of time. Patient to wear own compression stockings every day. Moisturize legs daily. Off-Loading Removable cast walker boot to: - right leg Wound  Treatment Wound #2 - Foot Wound Laterality: Plantar, Right Cleanser: Soap and Water 1 x Per Week/30 Days Discharge Instructions: May shower and wash wound with dial antibacterial soap and water prior to dressing change. Cleanser: Wound Cleanser 1 x Per Week/30 Days Discharge Instructions: Cleanse the wound with wound cleanser prior to applying [name] clean dressing using gauze sponges, not tissue or cotton balls. Peri-Wound Care: Zinc Oxide Ointment 30g tube 1 x Per Week/30 Days Discharge Instructions: Apply Zinc Oxide to periwound with each dressing change Prim Dressing: ADAPTIC TOUCH 3x4.25 (in/in) 1 x Per Week/30 Days ary Discharge Instructions: Apply to wound bed as instructed Secondary Dressing: Woven Gauze Sponge, Non-Sterile 4x4 in 1 x Per Week/30 Days Discharge Instructions: Apply over primary dressing as directed. Secondary Dressing: Zetuvit Plus 4x8 in 1 x Per Week/30 Days Discharge Instructions: Apply over primary dressing as directed. Secured With: [employer], 4.5x3.1 (in/yd) 1 x Per Week/30 Days Discharge Instructions: Secure with Kerlix as directed. Braid, Gerhart [name] ([id number]) [id number] Page 5 of 11 Patient Medications llergies: No Known Drug Allergies [name] Notifications Medication Indication Start End 01/27/2023 lidocaine DOSE topical 4 % cream - cream topical Electronic Signature(s) Signed: 01/27/2023 12:43:56 PM By: Duanne Guess MD FACS Entered By: Duanne Guess on 01/27/2023 10:42:43 -------------------------------------------------------------------------------- Problem List Details Patient Name: Date of Service: [name], [name]. 01/27/2023 10:00 [name] Medical Record Number: [id number] Patient Account Number: [id number] Date of Birth/Sex: Treating RN: [birth year] (67 y.o. M) Primary Care Provider: Arva Chafe Other Clinician: Referring Provider: Treating Provider/Extender: [name] in Treatment:  21 Active Problems ICD-10 Encounter Code Description Active Date MDM Diagnosis L97.512 Non-pressure chronic ulcer of other part of right foot with fat layer exposed 08/31/2022 No Yes I73.9 Peripheral vascular disease, unspecified 08/31/2022 No Yes I25.10 Atherosclerotic heart disease of native coronary artery without angina pectoris 08/31/2022 No Yes I10 Essential (primary) hypertension 08/31/2022 No Yes E11.65 Type 2 diabetes mellitus with hyperglycemia 08/31/2022 No Yes E11.40 Type 2 diabetes  mellitus with diabetic neuropathy, unspecified 08/31/2022 No Yes I63.9 Cerebral infarction, unspecified 08/31/2022 No Yes Inactive Problems Resolved Problems ICD-10 Code Description Active Date Resolved Date L97.821 Non-pressure chronic ulcer of other part of left lower leg limited to breakdown of skin 11/05/2022 11/05/2022 [name], [name] ([id number]) [phone].pdf Page 6 of 11 Electronic Signature(s) Signed: 01/27/2023 10:40:41 AM By: Duanne Guess MD FACS Entered By: Duanne Guess on 01/27/2023 10:40:41 -------------------------------------------------------------------------------- Progress Note Details Patient Name: Date of Service: [name], [name]. 01/27/2023 10:00 [name] Medical Record Number: [id number] Patient Account Number: [id number] Date of Birth/Sex: Treating RN: 01-26-1956 (67 y.o. M) Primary Care Provider: Arva Chafe Other Clinician: Referring Provider: Treating Provider/Extender: [name] in Treatment: 21 Subjective Chief Complaint Information obtained from Patient 05/14/2020; patient is here for review of an abrasion injury on the left lateral calf 08/31/2022: [name] (1st metatarsal base, plantar) History of Present Illness (HPI) ADMISSION 05/14/2020; this is [name] with multiple medical issues. Predominantly he has type 2 diabetes with [name] and also history of fairly  significant PAD. He had [name] in February 2017 he also had an atherectomy and angioplasty by Dr. [name] of the right popliteal artery in 2016. He is supposed to be getting arterial studies annually however this was interrupted last year because of the pandemic. He tells [place] he was at [employer] 2 weeks ago was getting out of of the scooter and traumatized his left lateral lower leg. There was [name] as the patient is on Plavix and Eliquis. They have been dressing this with Neosporin and doing [name] fairly good job. Wound measures 2.5 x 3.5 it does not have any depth he does not have [name] in his legs outside of surgery however he does have chronic edema and skin changes suggestive of chronic venous disease possibly some degree of lymphedema as well. Past medical history includes type 2 diabetes with peripheral neuropathy and gait instability, lumbar spondylosis, obstructive sleep apnea, history of [name], basal cell skin cancer, atrial fibrillation on anticoagulation, significant PAD as noted with [name] and [name] in 30th 2016. He also has [name] of coronary artery disease with an MI in 2002 hypertension hyperlipidemia and heart failure with preserved ejection fraction His last arterial studies I can see in epic were on 03/10/2018 this showed [name] and [name] with monophasic waveforms on the right. On the left his ABI was 1.20 with [name] and triphasic waveforms. He has not had arterial studies since. Our nurse in the clinic got an ABI on the left of 1.1 11/2; left anterior leg wound in the setting of chronic venous insufficiency. Wound was initially trauma. We have been using Hydrofera Blue under compression he has home health.. The wound looks [name] lot better today with  improvement in surface area 11/16; left anterior leg wound in the setting of chronic venous insufficiency. Wound was initially trauma. We have been using Hydrofera Blue under compression. The patient is closed today. He is supposed to follow-up with vein and vascular with regards to arterial insufficiency nevertheless his leg wounds are 12/3; apparently 2 weeks ago when they were putting on their stockings they managed to get 3 wounds on the left anterior lower leg from  abrasion when putting on the stockings. Home health came by the week of Thanksgiving and put Hydrofera Blue 4-layer wrap on this and there is only one superficial area remaining. The patient and his wife complained about the difficulties getting stockings on I think we are using 20/30. We will order bilateral external compression stockings which should be easier. 12/10; wound on the left anterior lower leg is closed. He has chronic venous insufficiency we ordered him Farrow wrap stockings unfortunately he did not bring these in. 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 He says that it has been present since mid December. He is currently residing in [facility] until March and [name] podiatrist has been looking after him there. They have been simply painting the area with Betadine. He has not had any lower extremity arterial studies since 2019, at which time his right ABI was 0.69. Measured in clinic today, it was 0.71. He is not aware of his most recent hemoglobin A1c, but historically he has had exceptionally poor control. On the basis of his right first metatarsal, there is [name] small crescent shaped wound. There is surrounding eschar and callus. There is no malodor or purulent drainage. 09/08/2022: The original wound is smaller today and fairly clean, but there is some discoloration and [name] pulpy texture to the adjacent callus. Underneath this, the tissue is open  exposing the fat layer. It looks like perhaps there was [name] crack in the callus and moisture got under the skin and caused breakdown. 09/15/2022: There has been more moisture related tissue breakdown. The callus is very soft and the underlying tissue is more open. 09/28/2022: The wound looks much better. He has done [name] good job keeping it dry. There is some callus overlying much of the wound surface. There is slough on the exposed open areas. 10/14/2022: There has been [name] lot of moisture related tissue breakdown. He is still forming callus over the top and then it seems that moisture gets underneath the callus and causes tissue damage. There is slough on the exposed wound surface. They will be moving back to the local area [place] the [place] this weekend. 10/21/2022: His foot is less wet, but there is no significant change to his wound. He has developed [name] blister on his left anterior tibial surface. There is no open [name], [name] ([id number]) [phone].pdf Page 7 of 11 wound here, but he does have some fairly significant edema. We are planning to apply [name] total contact cast today. 10/23/2022: Here for his obligatory first cast change. He says he has not had any issues wearing the cast or walking in the boot. No detrimental effects on his wound. 10/29/2022: The wound is looking better. Clearly, putting him in the cast has prevented water from getting in under his callus and causing further tissue breakdown. The blister on his left anterior tibial surface has not yet ruptured. Edema control is significantly improved. 11/05/2022: The wound on his right plantar foot is getting better. There is still some callus accumulation around the wounds, but there are just 2 open areas now, [name] very small 1 on the medial aspect of his metatarsal head and [name] little bit larger 1 on the plantar surface. The blister on his left anterior tibial surface is now open with hanging dry skin. 11/12/2022: The left anterior  tibial wound is closed. The wound on his right plantar foot is smaller with just [name] little bit of callus  around the edges. 11/19/2022: The right plantar foot wound continues to contract. The surface is quite clean. The tiny satellite area on the medial aspect of his foot has closed. 11/26/2022: The wound is smaller again today. He is responding well to the offloading effects of total contact casting. 12/03/2022: The wound is about the same size today. There is [name] little bit of moisture around the perimeter of the wound. No significant tissue breakdown. 12/10/2022: The wound is smaller today. There has been no moisture-related tissue breakdown of any maceration. 12/21/2022: The wound continues to contract. Moisture control is excellent. 12/28/2022: The wound is [name] little bit smaller today. 01/05/2023: The wound measures smaller today. Excellent moisture control with zero evidence of maceration. The wound surface is clean with healthy granulation tissue. 01/13/2023: We are leaving his Apligraf in situ for [name] second week. There is no discolored drainage or malodor coming from the site. 01/19/2023: The wound is smaller today. The periwound skin is in good condition without tissue maceration and the healed areas of tissue appear to have improved integrity. 01/27/2023: Continued contraction of the wound. No tissue maceration. The wound surface is flush with the surrounding skin. Patient History Information obtained from Patient. Family History Unknown History. Social History Never smoker, Marital Status - Single, Alcohol Use - Never, Drug Use - No History, Caffeine Use - Rarely. Medical History Respiratory Patient has history of Sleep Apnea Cardiovascular Patient has history of Arrhythmia - [name]-Fib, Coronary Artery Disease - s/p CABG, Deep Vein Thrombosis, Hypertension, Myocardial Infarction, Peripheral Arterial Disease - s/p Fempop x 2 Endocrine Patient has history of Type II Diabetes Neurologic Patient has  history of Neuropathy Hospitalization/Surgery History - colonoscopy. - polypectomy. - peripheral vascular cath. - shoulder arthroscopy. - carpal tunnel release. - coronary artery bypass. - appendectomy. - cardiac cath. - coronary angioplasty. - thrombectomy. - knee arthroplasty. - popliteal artery stent. 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 no acute distress. Vitals Time Taken: 9:43 AM, Height: 70 in, Weight: 260 lbs, BMI: 37.3, Temperature: 98.8 F, Pulse: 64 bpm, Respiratory Rate: 20 breaths/min, Blood Pressure: 174/90 mmHg. Respiratory Normal work of breathing on room air. [name], [name] ([id number]) [id number] Page 8 of 11 General Notes: 01/27/2023: Continued contraction of the wound. No tissue maceration. The wound surface is flush with the surrounding skin. Integumentary (Hair, Skin) Wound #2 status is Open. Original cause of wound was Gradually Appeared. The date acquired was: 07/08/2022. The wound has been in treatment 21 weeks. The wound is located on the Right,Plantar Foot. The wound measures 0.8cm length x 1.1cm width x 0.2cm depth; 0.691cm^2 area and 0.138cm^3 volume. There is Fat Layer (Subcutaneous Tissue) exposed. There is no tunneling or undermining noted. There is [name] medium amount of serosanguineous drainage noted. The wound margin is distinct with the outline attached to the wound base. There is large (67-100%) red granulation within the wound bed. There is [name] small (1-33%) amount of necrotic tissue within the wound bed including Adherent Slough. The periwound skin appearance had no abnormalities noted for color. The periwound skin appearance exhibited: Callus. The periwound skin appearance did not exhibit: Maceration. Periwound temperature was noted as No Abnormality. Assessment Active  Problems ICD-10 Non-pressure chronic ulcer of other part of right foot with fat layer exposed Peripheral vascular disease, unspecified Atherosclerotic heart disease of native coronary artery without angina pectoris Essential (primary) hypertension Type 2 diabetes mellitus with hyperglycemia Type 2  diabetes mellitus with diabetic neuropathy, unspecified Cerebral infarction, unspecified Procedures Wound #2 Pre-procedure diagnosis of Wound #2 is [name] Diabetic Wound/Ulcer of the Lower Extremity located on the Right,Plantar Foot. [name] skin graft procedure using [name] bioengineered skin substitute/cellular or tissue based product was performed by Duanne Guess, MD with the following instrument(s): Forceps and Scissors. Epicord was applied and secured with Steri-Strips. 3 sq cm of product was utilized and 3 sq cm was wasted due to wound size. Post Application, adaptic was applied. [name] Time Out was conducted at 10:07, prior to the start of the procedure. The procedure was tolerated well with [name] pain level of 0 throughout and [name] pain level of 0 following the procedure. Post procedure Diagnosis Wound #2: Same as Pre-Procedure General Notes: scribed for Dr. Lady Gary by Samuella Bruin, RN. Pre-procedure diagnosis of Wound #2 is [name] Diabetic Wound/Ulcer of the Lower Extremity located on the Right,Plantar Foot . There was [name] T Contact Cast otal Procedure by Duanne Guess, MD. Post procedure Diagnosis Wound #2: Same as Pre-Procedure Notes: scribed for Dr. Lady Gary by Samuella Bruin, RN. Plan Follow-up Appointments: Return Appointment in 1 week. - Dr. Lady Gary - room 2 Anesthetic: (In clinic) Topical Lidocaine 4% applied to wound bed Cellular or Tissue Based Products: Wound #2 Right,Plantar Foot: Cellular or Tissue Based Product Type: - Epicord #2 Cellular or Tissue Based Product applied to wound bed, secured with steri-strips, cover with Adaptic or Mepitel. (DO NOT REMOVE). Bathing/ Shower/ Hygiene: May  shower with protection but do not get wound dressing(s) wet. Protect dressing(s) with water repellant cover (for example, large plastic bag) or [name] cast cover and may then take shower. Edema Control - Lymphedema / SCD / Other: Elevate legs to the level of the heart or above for 30 minutes daily and/or when sitting for 3-4 times [name] day throughout the day. Avoid standing for long periods of time. Patient to wear own compression stockings every day. Moisturize legs daily. Off-Loading: Removable cast walker boot to: - right leg The following medication(s) was prescribed: lidocaine topical 4 % cream cream topical was prescribed at facility WOUND #2: - Foot Wound Laterality: Plantar, Right Cleanser: Soap and Water 1 x Per Week/30 Days Discharge Instructions: May shower and wash wound with dial antibacterial soap and water prior to dressing change. Cleanser: Wound Cleanser 1 x Per Week/30 Days Discharge Instructions: Cleanse the wound with wound cleanser prior to applying [name] clean dressing using gauze sponges, not tissue or cotton balls. Peri-Wound Care: Zinc Oxide Ointment 30g tube 1 x Per Week/30 Days Discharge Instructions: Apply Zinc Oxide to periwound with each dressing change Prim Dressing: ADAPTIC TOUCH 3x4.25 (in/in) 1 x Per Week/30 Days ary Discharge Instructions: Apply to wound bed as instructed Secondary Dressing: Woven Gauze Sponge, Non-Sterile 4x4 in 1 x Per Week/30 Days Discharge Instructions: Apply over primary dressing as directed. Secondary Dressing: Zetuvit Plus 4x8 in 1 x Per Week/30 Days Discharge Instructions: Apply over primary dressing as directed. [name], [name] ([id number]) [id number] Page 9 of 11 Secured With: [employer], 4.5x3.1 (in/yd) 1 x Per Week/30 Days Discharge Instructions: Secure with Kerlix as directed. 01/27/2023: Continued contraction of the wound. No tissue maceration. The wound surface is flush with the surrounding skin. I  first prepared the wound bed for Epicord application. The skin substitute was then hydrated with saline and applied in standard fashion to the wound bed. Approximately 50% of the product was used. I applied zinc oxide to the periwound skin and secured everything with Adaptic and  Steri-Strips. [name] total contact cast was then applied in standard fashion. He will follow-up in 1 week. Electronic Signature(s) Signed: 01/27/2023 10:46:18 AM By: Duanne Guess MD FACS Entered By: Duanne Guess on 01/27/2023 10:46:18 -------------------------------------------------------------------------------- HxROS Details Patient Name: Date of Service: [name], [name]. 01/27/2023 10:00 [name] Medical Record Number: [id number] Patient Account Number: [id number] Date of Birth/Sex: Treating RN: [birth year] (67 y.o. M) Primary Care Provider: Arva Chafe Other Clinician: Referring Provider: Treating Provider/Extender: [name] in Treatment: 21 Information Obtained From Patient Respiratory Medical History: Positive for: Sleep Apnea Cardiovascular Medical History: Positive for: Arrhythmia - [name]-Fib; Coronary Artery Disease - s/p CABG; Deep Vein Thrombosis; Hypertension; Myocardial Infarction; Peripheral Arterial Disease - s/p Fempop x 2 Past Medical History Notes: CVA x 3 Endocrine Medical History: Positive for: Type II Diabetes Time with diabetes: since 1997 Treated with: Insulin, Oral agents Blood sugar tested every day: Yes Tested : 2-3x per day Musculoskeletal Medical History: Past Medical History Notes: carpal tunnel syndrome Neurologic Medical History: Positive for: Neuropathy Past Medical History Notes: stroke Oncologic Medical History: Past Medical History Notes: skin cancer [name], [name] ([id number]) [id number] Page 10 of 11 Immunizations Pneumococcal Vaccine: Received Pneumococcal Vaccination: Yes Received Pneumococcal  Vaccination On or After 60th Birthday: Yes Implantable Devices None Hospitalization / Surgery History Type of Hospitalization/Surgery colonoscopy polypectomy peripheral vascular cath shoulder arthroscopy carpal tunnel release coronary artery bypass appendectomy cardiac cath coronary angioplasty thrombectomy knee arthroplasty popliteal artery stent Family and Social History Unknown History: Yes; Never smoker; Marital Status - Single; Alcohol Use: Never; Drug Use: No History; Caffeine Use: Rarely; Financial Concerns: No; Food, Clothing or Shelter Needs: No; Support System Lacking: No; Transportation Concerns: No Psychologist, prison and probation services) Signed: 01/27/2023 12:43:56 PM By: Duanne Guess MD FACS Entered By: Duanne Guess on 01/27/2023 10:42:05 -------------------------------------------------------------------------------- Total Contact Cast Details Patient Name: Date of Service: [name]. 01/27/2023 10:00 [name] Medical Record Number: [id number] Patient Account Number: [id number] Date of Birth/Sex: Treating RN: Aug 15, 1955 (67 y.o. [name] Primary Care Provider: Arva Chafe Other Clinician: Referring Provider: Treating Provider/Extender: [name] in Treatment: 21 T Contact Cast Applied for Wound Assessment: otal Wound #2 Right,Plantar Foot Performed By: Physician Duanne Guess, MD Post Procedure Diagnosis Same as Pre-procedure Notes scribed for Dr. Lady Gary by Samuella Bruin, RN Electronic Signature(s) Signed: 01/27/2023 12:43:56 PM By: Duanne Guess MD FACS Signed: 01/27/2023 4:26:01 PM By: Samuella Bruin Entered By: Samuella Bruin on 01/27/2023 10:14:35 -------------------------------------------------------------------------------- SuperBill Details Patient Name: Date of Service: [name], [name]. 01/27/2023 Medical Record Number: [id number] Patient Account Number: [id number] REISS, MOWREY [name] ([id number])  [id number] Page 11 of 11 Date of Birth/Sex: Treating RN: 1956/07/04 (67 y.o. M) Primary Care Provider: Arva Chafe Other Clinician: Referring Provider: Treating Provider/Extender: [name] in Treatment: 21 Diagnosis Coding ICD-10 Codes Code Description [phone] Non-pressure chronic ulcer of other part of right foot with fat layer exposed I73.9 Peripheral vascular disease, unspecified I25.10 Atherosclerotic heart disease of native coronary artery without angina pectoris I10 Essential (primary) hypertension E11.65 Type 2 diabetes mellitus with hyperglycemia E11.40 Type 2 diabetes mellitus with diabetic neuropathy, unspecified I63.9 Cerebral infarction, unspecified Facility Procedures : CPT4 Code: 41324401 Description: Q4187 Epicord 2cm x 3cm - per sqcm Modifier: Quantity: 6 : CPT4 Code: [id number] Description: 15275 - SKIN SUB GRAFT FACE/NK/HF/G ICD-10 Diagnosis Description L97.512 Non-pressure chronic ulcer of other part of right foot with fat layer exposed Modifier: Quantity: 1 : CPT4 Code: 44034742 Description: 59563 - APPLY TOTAL  CONTACT LEG CAST ICD-10 Diagnosis Description L97.512 Non-pressure chronic ulcer of other part of right foot with fat layer exposed Modifier: Quantity: 1 Physician Procedures : CPT4 Code Description Modifier [id number] 99213 - WC PHYS LEVEL 3 - EST PT 25 ICD-10 Diagnosis Description L97.512 Non-pressure chronic ulcer of other part of right foot with fat layer exposed I73.9 Peripheral vascular disease, unspecified E11.65 Type 2  diabetes mellitus with hyperglycemia E11.40 Type 2 diabetes mellitus with diabetic neuropathy, unspecified Quantity: 1 : [id number] 15275 - WC PHYS SKIN SUB GRAFT FACE/NK/HF/G ICD-10 Diagnosis Description L97.512 Non-pressure chronic ulcer of other part of right foot with fat layer exposed Quantity: 1 : [id number] 29445 - WC PHYS APPLY TOTAL CONTACT CAST ICD-10 Diagnosis  Description L97.512 Non-pressure chronic ulcer of other part of right foot with fat layer exposed Quantity: 1 Electronic Signature(s) Signed: 01/27/2023 10:46:50 AM By: Duanne Guess MD FACS Entered By: Duanne Guess on 01/27/2023 10:46:50

## 2023-01-29 ENCOUNTER — Telehealth: Payer: Self-pay | Admitting: Gastroenterology

## 2023-01-29 NOTE — Telephone Encounter (Signed)
Inbound call from patient requesting a call back regarding colonoscopy scheduled for 8/8 at [facility]. States he was not able to make follow up appointment on 7/5 due to being on Plavix. Requesting a call back to be advised on keep colonoscopy scheduled. Please advise, thank you.

## 2023-01-29 NOTE — Telephone Encounter (Signed)
Returned call to patient. Pt has been advised that colonoscopy will remain as scheduled but he will need to come in for an office visit. Pt's office appt has been rescheduled to Friday, 02/05/23 at 10 am with [place], PA-C. Pt understands that if he misses this office visit his colonoscopy appt will be cancelled for August. Pt verbalized understanding and had no concerns at the end of the call.

## 2023-02-02 ENCOUNTER — Encounter (HOSPITAL_BASED_OUTPATIENT_CLINIC_OR_DEPARTMENT_OTHER): Payer: Medicare HMO | Admitting: General Surgery

## 2023-02-02 DIAGNOSIS — L97512 Non-pressure chronic ulcer of other part of right foot with fat layer exposed: Secondary | ICD-10-CM | POA: Diagnosis not present

## 2023-02-02 DIAGNOSIS — E1165 Type 2 diabetes mellitus with hyperglycemia: Secondary | ICD-10-CM | POA: Diagnosis not present

## 2023-02-02 DIAGNOSIS — E1140 Type 2 diabetes mellitus with diabetic neuropathy, unspecified: Secondary | ICD-10-CM | POA: Diagnosis not present

## 2023-02-02 DIAGNOSIS — Z85828 Personal history of other malignant neoplasm of skin: Secondary | ICD-10-CM | POA: Diagnosis not present

## 2023-02-02 DIAGNOSIS — E1151 Type 2 diabetes mellitus with diabetic peripheral angiopathy without gangrene: Secondary | ICD-10-CM | POA: Diagnosis not present

## 2023-02-02 DIAGNOSIS — I4891 Unspecified atrial fibrillation: Secondary | ICD-10-CM | POA: Diagnosis not present

## 2023-02-02 DIAGNOSIS — Z7901 Long term (current) use of anticoagulants: Secondary | ICD-10-CM | POA: Diagnosis not present

## 2023-02-02 DIAGNOSIS — I110 Hypertensive heart disease with heart failure: Secondary | ICD-10-CM | POA: Diagnosis not present

## 2023-02-02 DIAGNOSIS — I5032 Chronic diastolic (congestive) heart failure: Secondary | ICD-10-CM | POA: Diagnosis not present

## 2023-02-02 DIAGNOSIS — Z7902 Long term (current) use of antithrombotics/antiplatelets: Secondary | ICD-10-CM | POA: Diagnosis not present

## 2023-02-02 DIAGNOSIS — I2510 Atherosclerotic heart disease of native coronary artery without angina pectoris: Secondary | ICD-10-CM | POA: Diagnosis not present

## 2023-02-02 DIAGNOSIS — G4733 Obstructive sleep apnea (adult) (pediatric): Secondary | ICD-10-CM | POA: Diagnosis not present

## 2023-02-02 DIAGNOSIS — E11621 Type 2 diabetes mellitus with foot ulcer: Secondary | ICD-10-CM | POA: Diagnosis not present

## 2023-02-02 NOTE — Progress Notes (Addendum)
Brouse, Timmie [name] ([id number]) [id number] Page 1 of 7 Visit Report for 02/02/2023 Arrival Information Details Patient Name: Date of Service: [name], [name]. 02/02/2023 10:15 [name] Medical Record Number: [id number] Patient Account Number: [id number] Date of Birth/Sex: Treating RN: 14-Sep-1955 (67 y.o. M) Primary Care Cherrish Vitali: Arva Chafe Other Clinician: Referring Avrie Kedzierski: Treating Adreonna Yontz/Extender: [name] in Treatment: 22 Visit Information History Since Last Visit All ordered tests and consults were completed: No Patient Arrived: Ambulatory Added or deleted any medications: No Arrival Time: 10:09 Any new allergies or adverse reactions: No Accompanied By: friend Had [name] fall or experienced change in No Transfer Assistance: None activities of daily living that may affect Patient Identification Verified: Yes risk of falls: Secondary Verification Process Completed: Yes Signs or symptoms of abuse/neglect since last visito No Patient Requires Transmission-Based Precautions: No Hospitalized since last visit: No Patient Has Alerts: No Implantable device outside of the clinic excluding No cellular tissue based products placed in the center since last visit: Pain Present Now: No Electronic Signature(s) Signed: 02/02/2023 1:35:06 PM By: Dayton Scrape Entered By: Dayton Scrape on 02/02/2023 10:10:06 -------------------------------------------------------------------------------- Complex / Palliative Patient Assessment Details Patient Name: Date of Service: [name], [name]. 02/02/2023 10:15 [name] Medical Record Number: [id number] Patient Account Number: [id number] Date of Birth/Sex: Treating RN: 1955-10-25 (67 y.o. [name] Primary Care Edelmiro Innocent: Arva Chafe Other Clinician: Referring Mikaylah Libbey: Treating Antonisha Waskey/Extender: [name] in Treatment: 22 Complex Wound Management  Criteria Patient has remarkable or complex co-morbidities requiring medications or treatments that extend wound healing times. Examples: Diabetes mellitus with chronic renal failure or end stage renal disease requiring dialysis Advanced or poorly controlled rheumatoid arthritis Diabetes mellitus and end stage chronic obstructive pulmonary disease Active cancer with current chemo- or radiation therapy diabetes, afib, CAD, PAD, htn Palliative Wound Management Criteria Care Approach Wound Care Plan: Complex Wound Management Electronic Signature(s) Signed: 02/08/2023 11:13:49 AM By: Duanne Guess MD FACS Signed: 02/08/2023 6:34:47 PM By: Zenaida Deed RN, BSN Entered By: Zenaida Deed on 02/04/2023 17:26:43 [name], Kasyn [name] ([id number]) [id number] Page 2 of 7 -------------------------------------------------------------------------------- Encounter Discharge Information Details Patient Name: Date of Service: [name], [name]. 02/02/2023 10:15 [name] Medical Record Number: [id number] Patient Account Number: [id number] Date of Birth/Sex: Treating RN: [birth year] (67 y.o. [name] Primary Care Jonnathan Birman: Arva Chafe Other Clinician: Referring Sindi Beckworth: Treating Zea Kostka/Extender: [name] in Treatment: 22 Encounter Discharge Information Items Post Procedure Vitals Discharge Condition: Stable Temperature (F): 98 Ambulatory Status: Ambulatory Pulse (bpm): 52 Discharge Destination: Home Respiratory Rate (breaths/min): 20 Transportation: Private Auto Blood Pressure (mmHg): 178/75 Accompanied By: self Schedule Follow-up Appointment: Yes Clinical Summary of Care: Patient Declined Electronic Signature(s) Signed: 02/02/2023 4:09:39 PM By: Karie Schwalbe RN Entered By: Karie Schwalbe on 02/02/2023 16:06:26 -------------------------------------------------------------------------------- Lower Extremity Assessment  Details Patient Name: Date of Service: [name] Sabal [name]. 02/02/2023 10:15 [name] Medical Record Number: [id number] Patient Account Number: [id number] Date of Birth/Sex: Treating RN: [birth year] (67 y.o. [name] Primary Care Khailee Mick: Arva Chafe Other Clinician: Referring Maiah Sinning: Treating Betsabe Iglesia/Extender: [name] in Treatment: 22 Edema Assessment Assessed: [Left: No] [Right: No] Edema: [Left: Ye] [Right: s] Calf Left: Right: Point of Measurement: From Medial Instep 37 cm Ankle Left: Right: Point of Measurement: From Medial Instep 25.5 cm Vascular Assessment Pulses: Dorsalis Pedis Palpable: [Right:Yes] Extremity colors, hair growth, and conditions: Extremity Color: [Right:Normal] Hair Growth on Extremity: [Right:Yes Warm] Electronic Signature(s) Signed: 02/02/2023 4:09:39 PM By:  Karie Schwalbe RN Entered By: Karie Schwalbe on 02/02/2023 10:47:46 [name] ([id number]) [id number] Page 3 of 7 -------------------------------------------------------------------------------- Multi Wound Chart Details Patient Name: Date of Service: [name], [name]. 02/02/2023 10:15 [name] Medical Record Number: [id number] Patient Account Number: [id number] Date of Birth/Sex: Treating RN: Apr 17, 1956 (67 y.o. M) Primary Care Shaquan Puerta: Arva Chafe Other Clinician: Referring Darcey Demma: Treating Shacoya Burkhammer/Extender: [name] in Treatment: 22 Vital Signs Height(in): 70 Pulse(bpm): 52 Weight(lbs): 260 Blood Pressure(mmHg): 178/75 Body Mass Index(BMI): 37.3 Temperature(F): 98.0 Respiratory Rate(breaths/min): 20 [2:Photos:] [[name]:[name]] Right, Plantar Foot [name] [name] Wound Location: Gradually Appeared [name] [name] Wounding Event: Diabetic Wound/Ulcer of the Lower [name] [name] Primary Etiology: Extremity Sleep Apnea, Arrhythmia, Coronary [name] [name] Comorbid History: Artery Disease, Deep Vein Thrombosis,  Hypertension, Myocardial Infarction, Peripheral Arterial Disease, Type II Diabetes, Neuropathy 07/08/2022 [name] [name] Date Acquired: 22 [name] [name] Weeks of Treatment: Open [name] [name] Wound Status: No [name] [name] Wound Recurrence: 0.8x0.9x0.1 [name] [name] Measurements L x W x D (cm) 0.565 [name] [name] [name] : rea 0.057 [name] [name] Volume (cm) : 77.90% [name] [name] % Reduction in [name] rea: 77.60% [name] [name] % Reduction in Volume: Grade 1 [name] [name] Classification: Medium [name] [name] Exudate [name] mount: Serosanguineous [name] [name] Exudate Type: red, brown [name] [name] Exudate Color: Distinct, outline attached [name] [name] Wound Margin: Large (67-100%) [name] [name] Granulation [name] mount: Red [name] [name] Granulation Quality: Small (1-33%) [name] [name] Necrotic [name] mount: Eschar, Adherent Slough [name] [name] Necrotic Tissue: Fat Layer (Subcutaneous Tissue): Yes [name] [name] Exposed Structures: Fascia: No Tendon: No Muscle: No Joint: No Bone: No Medium (34-66%) [name] [name] Epithelialization: Callus: Yes [name] [name] Periwound Skin Texture: Maceration: No [name] [name] Periwound Skin Moisture: No Abnormalities Noted [name] [name] Periwound Skin Color: No Abnormality [name] [name] Temperature: Cellular or Tissue Based Product [name] [name] Procedures Performed: T Contact Cast otal Treatment Notes [name] ([id number]) [phone].pdf Page 4 of 7 Electronic Signature(s) Signed: 02/02/2023 11:13:12 AM By: Duanne Guess MD FACS Entered By: Duanne Guess on 02/02/2023 11:13:11 -------------------------------------------------------------------------------- Multi-Disciplinary Care Plan Details Patient Name: Date of Service: [name], [name]. 02/02/2023 10:15 [name] Medical Record Number: [id number] Patient Account Number: [id number] Date of Birth/Sex: Treating RN: 1956/01/24 (68 y.o. [name] Primary Care Chancey Ringel: Arva Chafe Other Clinician: Referring Shahid Flori: Treating Annet Manukyan/Extender: [name] in  Treatment: 22 Multidisciplinary Care Plan reviewed with physician Active Inactive Abuse / Safety / Falls / Self Care Management Nursing Diagnoses: Impaired physical mobility Potential for falls Goals: Patient will not experience any injury related to falls Date Initiated: 08/31/2022 Target Resolution Date: 02/19/2023 Goal Status: Active Patient/caregiver will verbalize/demonstrate measures taken to improve the patient's personal safety Date Initiated: 08/31/2022 Target Resolution Date: 02/19/2023 Goal Status: Active Interventions: Provide education on basic hygiene Provide education on fall prevention Notes: Electronic Signature(s) Signed: 02/02/2023 4:09:39 PM By: Karie Schwalbe RN Entered By: Karie Schwalbe on 02/02/2023 16:02:39 -------------------------------------------------------------------------------- Pain Assessment Details Patient Name: Date of Service: [name] Sabal [name]. 02/02/2023 10:15 [name] Medical Record Number: [id number] Patient Account Number: [id number] Date of Birth/Sex: Treating RN: [birth year] (67 y.o. M) Primary Care Kirah Stice: Arva Chafe Other Clinician: Referring Aoife Bold: Treating Vicki Chaffin/Extender: [name] in Treatment: 22 Active Problems Location of Pain Severity and Description of Pain Patient Has Paino No Site Locations [place], [place] [name] ([id number]) [phone].pdf Page 5 of 7 Pain Management and Medication Current Pain Management: Electronic Signature(s) Signed: 02/02/2023 1:35:06 PM By: Dayton Scrape Entered By: Dayton Scrape on  02/02/2023 10:10:34 -------------------------------------------------------------------------------- Patient/Caregiver Education Details Patient Name: Date of Service: [name]. 7/16/2024andnbsp10:15 [name] Medical Record Number: [id number] Patient Account Number: [id number] Date of Birth/Gender: Treating RN: [birth year] (67 y.o. [name] Primary Care  Physician: Arva Chafe Other Clinician: Referring Physician: Treating Physician/Extender: [name] in Treatment: 22 Education Assessment Education Provided To: Patient Education Topics Provided Wound/Skin Impairment: Methods: Explain/Verbal Responses: Return demonstration correctly Electronic Signature(s) Signed: 02/02/2023 4:09:39 PM By: Karie Schwalbe RN Entered By: Karie Schwalbe on 02/02/2023 16:02:57 -------------------------------------------------------------------------------- Wound Assessment Details Patient Name: Date of Service: [name] Pian, [name]. 02/02/2023 10:15 [name] Medical Record Number: [id number] Patient Account Number: [id number] Date of Birth/Sex: Treating RN: April 10, 1956 (67 y.o. M) Primary Care Darla Mcdonald: Arva Chafe Other Clinician: Referring [name]: Treating [name]/Extender: Fleet Contras, [name] ([id number]) [id number] Page 6 of 7 Weeks in Treatment: 22 Wound Status Wound Number: 2 Primary Diabetic Wound/Ulcer of the Lower Extremity Etiology: Wound Location: Right, Plantar Foot Wound Open Wounding Event: Gradually Appeared Status: Date Acquired: 07/08/2022 Comorbid Sleep Apnea, Arrhythmia, Coronary Artery Disease, Deep Vein Weeks Of Treatment: 22 History: Thrombosis, Hypertension, Myocardial Infarction, Peripheral Arterial Clustered Wound: No Disease, Type II Diabetes, Neuropathy Photos Wound Measurements Length: (cm) 0.8 Width: (cm) 0.9 Depth: (cm) 0.1 Area: (cm) 0.565 Volume: (cm) 0.057 % Reduction in Area: 77.9% % Reduction in Volume: 77.6% Epithelialization: Medium (34-66%) Tunneling: No Undermining: No Wound Description Classification: Grade 1 Wound Margin: Distinct, outline attached Exudate Amount: Medium Exudate Type: Serosanguineous Exudate Color: red, brown Foul Odor After Cleansing: No Slough/Fibrino Yes Wound Bed Granulation  Amount: Large (67-100%) Exposed Structure Granulation Quality: Red Fascia Exposed: No Necrotic Amount: Small (1-33%) Fat Layer (Subcutaneous Tissue) Exposed: Yes Necrotic Quality: Eschar, Adherent Slough Tendon Exposed: No Muscle Exposed: No Joint Exposed: No Bone Exposed: No Periwound Skin Texture Texture Color No Abnormalities Noted: No No Abnormalities Noted: Yes Callus: Yes Temperature / Pain Temperature: No Abnormality Moisture No Abnormalities Noted: No Maceration: No Treatment Notes Wound #2 (Foot) Wound Laterality: Plantar, Right Cleanser Soap and Water Discharge Instruction: May shower and wash wound with dial antibacterial soap and water prior to dressing change. Wound Cleanser Discharge Instruction: Cleanse the wound with wound cleanser prior to applying [name] clean dressing using gauze sponges, not tissue or cotton balls. Peri-Wound Care Zinc Oxide Ointment 30g tube Discharge Instruction: Apply Zinc Oxide to periwound with each dressing change Topical Primary Dressing Kamer, [name] ([id number]) [phone].pdf Page 7 of 7 ADAPTIC TOUCH 3x4.25 (in/in) Discharge Instruction: Apply to wound bed as instructed EPICORD Discharge Instruction: #3 (02/02/23) TCC Secondary Dressing Woven Gauze Sponge, Non-Sterile 4x4 in Discharge Instruction: Apply over primary dressing as directed. Zetuvit Plus 4x8 in Discharge Instruction: Apply over primary dressing as directed. Secured With [employer], 4.5x3.1 (in/yd) Discharge Instruction: Secure with Kerlix as directed. Compression Wrap Compression Stockings Add-Ons Electronic Signature(s) Signed: 02/02/2023 4:09:39 PM By: Karie Schwalbe RN Entered By: Karie Schwalbe on 02/02/2023 10:48:11 -------------------------------------------------------------------------------- Vitals Details Patient Name: Date of Service: [name] Pian, [name]. 02/02/2023 10:15 [name] Medical Record Number: [id number] Patient  Account Number: [id number] Date of Birth/Sex: Treating RN: Oct 28, 1955 (67 y.o. M) Primary Care Jaasia Viglione: Arva Chafe Other Clinician: Referring Earley Grobe: Treating Adlynn Lowenstein/Extender: [name] in Treatment: 22 Vital Signs Time Taken: 10:10 Temperature (F): 98.0 Height (in): 70 Pulse (bpm): 52 Weight (lbs): 260 Respiratory Rate (breaths/min): 20 Body Mass Index (BMI): 37.3 Blood Pressure (mmHg): 178/75 Reference Range: 80 - 120 mg / dl Electronic Signature(s)  Signed: 02/02/2023 1:35:06 PM By: Dayton Scrape Entered By: Dayton Scrape on 02/02/2023 10:10:28

## 2023-02-02 NOTE — Progress Notes (Signed)
[name], [name] ([id number]) [id number] Page 1 of 11 Visit Report for 02/02/2023 Chief Complaint Document Details Patient Name: Date of Service: [name], [name]. 02/02/2023 10:15 [name] Medical Record Number: [id number] Patient Account Number: [id number] Date of Birth/Sex: Treating RN: 1955-09-12 (67 y.o. M) Primary Care Provider: Arva Chafe Other Clinician: Referring Provider: Treating Provider/Extender: [name] in Treatment: 22 Information Obtained from: Patient Chief Complaint 05/14/2020; patient is here for review of an abrasion injury on the left lateral calf 08/31/2022: [name] (1st metatarsal base, plantar) Electronic Signature(s) Signed: 02/02/2023 11:13:36 AM By: Duanne Guess MD FACS Entered By: Duanne Guess on 02/02/2023 11:13:35 -------------------------------------------------------------------------------- Cellular or Tissue Based Product Details Patient Name: Date of Service: [name]. 02/02/2023 10:15 [name] Medical Record Number: [id number] Patient Account Number: [id number] Date of Birth/Sex: Treating RN: 1956/07/19 (67 y.o. [name] Primary Care Provider: Arva Chafe Other Clinician: Referring Provider: Treating Provider/Extender: [name] in Treatment: 22 Cellular or Tissue Based Product Type Wound #2 Right,Plantar Foot Applied to: Performed By: Physician Duanne Guess, MD Cellular or Tissue Based Product Type: Epicord Level of Consciousness (Pre-procedure): Awake and Alert Pre-procedure Verification/Time Out Yes - 10:55 Taken: Location: genitalia / hands / feet / multiple digits Wound Size (sq cm): 0.72 Product Size (sq cm): 6 Waste Size (sq cm): 0 Amount of Product Applied (sq cm): 6 Instrument Used: Blade, Curette, Forceps, Scissors Lot #: [id number] Order #: 3 Expiration Date: 07/21/2027 Fenestrated: Yes Instrument:  Blade Reconstituted: Yes Solution Type: Normal Saline Solution Amount: 5 ml Lot #: [id number] KS Solution Expiration Date: 08/10/2024 Secured: Yes Secured With: Steri-Strips Dressing Applied: Yes Primary Dressing: Adaptic,gauze Procedural Pain: 0 Gibbard, Sy [name] Page 2 of 11 Post Procedural Pain: 0 Response to Treatment: Procedure was tolerated well Level of Consciousness (Post- Awake and Alert procedure): Post Procedure Diagnosis Same as Pre-procedure Notes Scribed for Dr. Lady Gary by Knute Neu Electronic Signature(s) Signed: 02/02/2023 4:49:43 PM By: Karie Schwalbe RN Signed: 02/03/2023 12:38:39 PM By: Duanne Guess MD FACS Previous Signature: 02/02/2023 11:13:29 AM Version By: Duanne Guess MD FACS Entered By: Karie Schwalbe on 02/02/2023 16:41:53 -------------------------------------------------------------------------------- HPI Details Patient Name: Date of Service: [name], [name]. 02/02/2023 10:15 [name] Medical Record Number: [id number] Patient Account Number: [id number] Date of Birth/Sex: Treating RN: July 20, 1956 (67 y.o. M) Primary Care Provider: Arva Chafe Other Clinician: Referring Provider: Treating Provider/Extender: [name] in Treatment: 22 History of Present Illness HPI Description: ADMISSION 05/14/2020; this is [name] with multiple medical issues. Predominantly he has type 2 diabetes with [name] and also history of fairly significant PAD. He had [name] in February 2017 he also had an atherectomy and angioplasty by Dr. [name] of the right popliteal artery in 2016. He is supposed to be getting arterial studies annually however this was interrupted last year because of the pandemic. He tells [place] he was at [facility] 2 weeks ago was getting out of of the scooter and traumatized his left lateral  lower leg. There was [name] as the patient is on Plavix and Eliquis. They have been dressing this with Neosporin and doing [name] fairly good job. Wound measures 2.5 x 3.5 it does not have any depth he does not have [name] in his legs outside of surgery however he does have chronic edema and skin changes suggestive of chronic venous disease possibly  some degree of lymphedema as well. Past medical history includes type 2 diabetes with peripheral neuropathy and gait instability, lumbar spondylosis, obstructive sleep apnea, history of [name], basal cell skin cancer, atrial fibrillation on anticoagulation, significant PAD as noted with [name] and [name] in 30th 2016. He also has [name] of coronary artery disease with an MI in 2002 hypertension hyperlipidemia and heart failure with preserved ejection fraction His last arterial studies I can see in epic were on 03/10/2018 this showed [name] and [name] with monophasic waveforms on the right. On the left his ABI was 1.20 with [name] and triphasic waveforms. He has not had arterial studies since. Our nurse in the clinic got an ABI on the left of 1.1 11/2; left anterior leg wound in the setting of chronic venous insufficiency. Wound was initially trauma. We have been using Hydrofera Blue under compression he has home health.. The wound looks [name] lot better today with improvement in surface area 11/16; left anterior leg wound in the setting of chronic venous insufficiency. Wound was initially trauma. We have been using Hydrofera Blue under compression. The patient is closed today. He is supposed to follow-up with vein and vascular with regards to arterial insufficiency nevertheless his leg wounds are 12/3; apparently 2 weeks ago when they were putting on their stockings they managed to get  3 wounds on the left anterior lower leg from abrasion when putting on the stockings. Home health came by the week of Thanksgiving and put Hydrofera Blue 4-layer wrap on this and there is only one superficial area remaining. The patient and his wife complained about the difficulties getting stockings on I think we are using 20/30. We will order bilateral external compression stockings which should be easier. 12/10; wound on the left anterior lower leg is closed. He has chronic venous insufficiency we ordered him Farrow wrap stockings unfortunately he did not bring these in. 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 He says that it has been present since mid December. He is currently residing in [facility] until March and [name] podiatrist has been looking after him there. They have been simply painting the area with Betadine. He has not had any lower extremity arterial studies since 2019, at which time his right ABI was 0.69. Measured in clinic today, it was 0.71. He is not aware of his most recent hemoglobin A1c, but historically he has had exceptionally poor control. On the basis of his right first metatarsal, there is [name] small crescent shaped wound. There is surrounding eschar and callus. There is no malodor or purulent drainage. 09/08/2022: The original wound is smaller today and fairly clean, but there is some discoloration and [name] pulpy texture to the adjacent callus. Underneath this, the tissue is open exposing the fat layer. It looks like perhaps there was [name] crack in the callus and moisture got under the skin and caused breakdown. 09/15/2022: There has been more moisture related tissue breakdown. The callus is very soft and the underlying tissue is more open. [name], [name] ([id number]) [id number] Page 3 of 11 09/28/2022: The wound looks much better. He has done [name] good job keeping it dry. There is some  callus overlying much of the wound surface. There is slough on the exposed  open areas. 10/14/2022: There has been [name] lot of moisture related tissue breakdown. He is still forming callus over the top and then it seems that moisture gets underneath the callus and causes tissue damage. There is slough on the exposed wound surface. They will be moving back to the local area from the [place] this weekend. 10/21/2022: His foot is less wet, but there is no significant change to his wound. He has developed [name] blister on his left anterior tibial surface. There is no open wound here, but he does have some fairly significant edema. We are planning to apply [name] total contact cast today. 10/23/2022: Here for his obligatory first cast change. He says he has not had any issues wearing the cast or walking in the boot. No detrimental effects on his wound. 10/29/2022: The wound is looking better. Clearly, putting him in the cast has prevented water from getting in under his callus and causing further tissue breakdown. The blister on his left anterior tibial surface has not yet ruptured. Edema control is significantly improved. 11/05/2022: The wound on his right plantar foot is getting better. There is still some callus accumulation around the wounds, but there are just 2 open areas now, [name] very small 1 on the medial aspect of his metatarsal head and [name] little bit larger 1 on the plantar surface. The blister on his left anterior tibial surface is now open with hanging dry skin. 11/12/2022: The left anterior tibial wound is closed. The wound on his right plantar foot is smaller with just [name] little bit of callus around the edges. 11/19/2022: The right plantar foot wound continues to contract. The surface is quite clean. The tiny satellite area on the medial aspect of his foot has closed. 11/26/2022: The wound is smaller again today. He is responding well to the offloading effects of total contact casting. 12/03/2022: The wound is about the  same size today. There is [name] little bit of moisture around the perimeter of the wound. No significant tissue breakdown. 12/10/2022: The wound is smaller today. There has been no moisture-related tissue breakdown of any maceration. 12/21/2022: The wound continues to contract. Moisture control is excellent. 12/28/2022: The wound is [name] little bit smaller today. 01/05/2023: The wound measures smaller today. Excellent moisture control with zero evidence of maceration. The wound surface is clean with healthy granulation tissue. 01/13/2023: We are leaving his Apligraf in situ for [name] second week. There is no discolored drainage or malodor coming from the site. 01/19/2023: The wound is smaller today. The periwound skin is in good condition without tissue maceration and the healed areas of tissue appear to have improved integrity. 01/27/2023: Continued contraction of the wound. No tissue maceration. The wound surface is flush with the surrounding skin. 02/02/2023: The wound measured [name] little bit smaller today. There is some bruising just adjacent to the wound, but this has not resulted in any tissue breakdown. Electronic Signature(s) Signed: 02/02/2023 11:14:07 AM By: Duanne Guess MD FACS Entered By: Duanne Guess on 02/02/2023 11:14:07 -------------------------------------------------------------------------------- Physical Exam Details Patient Name: Date of Service: [name], [name]. 02/02/2023 10:15 [name] Medical Record Number: [id number] Patient Account Number: [id number] Date of Birth/Sex: Treating RN: 02-Mar-1956 (67 y.o. M) Primary Care Provider: Arva Chafe Other Clinician: Referring Provider: Treating Provider/Extender: [name] in Treatment: 22 Constitutional Hypertensive, asymptomatic. Bradycardic, asymptomatic. . . no acute distress. Respiratory Normal work of breathing on room air. Notes 02/02/2023: The wound measured [name] little bit smaller today. There is some  bruising just adjacent to the wound, but this has not resulted in any tissue breakdown. Electronic Signature(s) Signed: 02/02/2023 11:14:39 AM By: Duanne Guess MD FACS Entered By: Duanne Guess on 02/02/2023 11:14:39 [name], [name] ([id number]) [id number] Page 4 of 11 -------------------------------------------------------------------------------- Physician Orders Details Patient Name: Date of Service: [name], [name]. 02/02/2023 10:15 [name] Medical Record Number: [id number] Patient Account Number: [id number] Date of Birth/Sex: Treating RN: Oct 29, 1955 (67 y.o. [name] Primary Care Provider: Arva Chafe Other Clinician: Referring Provider: Treating Provider/Extender: [name] in Treatment: 22 Verbal / Phone Orders: No Diagnosis Coding ICD-10 Coding Code Description [phone] Non-pressure chronic ulcer of other part of right foot with fat layer exposed I73.9 Peripheral vascular disease, unspecified I25.10 Atherosclerotic heart disease of native coronary artery without angina pectoris I10 Essential (primary) hypertension E11.65 Type 2 diabetes mellitus with hyperglycemia E11.40 Type 2 diabetes mellitus with diabetic neuropathy, unspecified I63.9 Cerebral infarction, unspecified Follow-up Appointments ppointment in 1 week. - Dr. Lady Gary - room 2 Tuesday 02/09/23 at 9:15am Return [name] 02/16/23 at 9:15am Anesthetic (In clinic) Topical Lidocaine 4% applied to wound bed Cellular or Tissue Based Products Wound #2 Right,Plantar Foot Cellular or Tissue Based Product Type: - Epicord #3 Cellular or Tissue Based Product applied to wound bed, secured with steri-strips, cover with Adaptic or Mepitel. (DO NOT REMOVE). Bathing/ Shower/ Hygiene May shower with protection but do not get wound dressing(s) wet. Protect dressing(s) with water repellant cover (for example, large plastic bag) or [name] cast cover and may then take  shower. Edema Control - Lymphedema / SCD / Other Elevate legs to the level of the heart or above for 30 minutes daily and/or when sitting for 3-4 times [name] day throughout the day. Avoid standing for long periods of time. Patient to wear own compression stockings every day. Moisturize legs daily. Off-Loading Removable cast walker boot to: - right leg Wound Treatment Wound #2 - Foot Wound Laterality: Plantar, Right Cleanser: Soap and Water 1 x Per Week/30 Days Discharge Instructions: May shower and wash wound with dial antibacterial soap and water prior to dressing change. Cleanser: Wound Cleanser 1 x Per Week/30 Days Discharge Instructions: Cleanse the wound with wound cleanser prior to applying [name] clean dressing using gauze sponges, not tissue or cotton balls. Peri-Wound Care: Zinc Oxide Ointment 30g tube 1 x Per Week/30 Days Discharge Instructions: Apply Zinc Oxide to periwound with each dressing change Prim Dressing: ADAPTIC TOUCH 3x4.25 (in/in) 1 x Per Week/30 Days ary Discharge Instructions: Apply to wound bed as instructed Prim Dressing: EPICORD 1 x Per Week/30 Days ary Discharge Instructions: #3 (02/02/23) Prim Dressing: TCC ary 1 x Per Week/30 Days Secondary Dressing: Woven Gauze Sponge, Non-Sterile 4x4 in 1 x Per Week/30 Days Discharge Instructions: Apply over primary dressing as directed. [name], [name] ([id number]) [id number] Page 5 of 11 Secondary Dressing: Zetuvit Plus 4x8 in 1 x Per Week/30 Days Discharge Instructions: Apply over primary dressing as directed. Secured With: [employer], 4.5x3.1 (in/yd) 1 x Per Week/30 Days Discharge Instructions: Secure with Kerlix as directed. Electronic Signature(s) Signed: 02/02/2023 12:23:22 PM By: Duanne Guess MD FACS Entered By: Duanne Guess on 02/02/2023 11:15:31 -------------------------------------------------------------------------------- Problem List Details Patient Name: Date of  Service: [name], [name]. 02/02/2023 10:15 [name] Medical Record Number: [id number] Patient Account Number: [id number] Date of Birth/Sex: Treating RN: 31-Mar-1956 (67 y.o. M) Primary Care Provider: Arva Chafe Other Clinician: Referring Provider: Treating Provider/Extender: [name] in Treatment: 22 Active Problems ICD-10  Encounter Code Description Active Date MDM Diagnosis L97.512 Non-pressure chronic ulcer of other part of right foot with fat layer exposed 08/31/2022 No Yes I73.9 Peripheral vascular disease, unspecified 08/31/2022 No Yes I25.10 Atherosclerotic heart disease of native coronary artery without angina pectoris 08/31/2022 No Yes I10 Essential (primary) hypertension 08/31/2022 No Yes E11.65 Type 2 diabetes mellitus with hyperglycemia 08/31/2022 No Yes E11.40 Type 2 diabetes mellitus with diabetic neuropathy, unspecified 08/31/2022 No Yes I63.9 Cerebral infarction, unspecified 08/31/2022 No Yes Inactive Problems Resolved Problems ICD-10 Code Description Active Date Resolved Date L97.821 Non-pressure chronic ulcer of other part of left lower leg limited to breakdown of skin 11/05/2022 11/05/2022 Electronic Signature(s) [name] ([id number]) [id number] Page 6 of 11 Signed: 02/02/2023 11:13:07 AM By: Duanne Guess MD FACS Entered By: Duanne Guess on 02/02/2023 11:13:07 -------------------------------------------------------------------------------- Progress Note Details Patient Name: Date of Service: [name], [name]. 02/02/2023 10:15 [name] Medical Record Number: [id number] Patient Account Number: [id number] Date of Birth/Sex: Treating RN: Aug 30, 1955 (67 y.o. M) Primary Care Provider: Arva Chafe Other Clinician: Referring Provider: Treating Provider/Extender: [name] in Treatment: 22 Subjective Chief Complaint Information obtained from Patient 05/14/2020; patient is  here for review of an abrasion injury on the left lateral calf 08/31/2022: [name] (1st metatarsal base, plantar) History of Present Illness (HPI) ADMISSION 05/14/2020; this is [name] with multiple medical issues. Predominantly he has type 2 diabetes with [name] and also history of fairly significant PAD. He had [name] in February 2017 he also had an atherectomy and angioplasty by Dr. [name] of the right popliteal artery in 2016. He is supposed to be getting arterial studies annually however this was interrupted last year because of the pandemic. He tells [place] he was at [facility] 2 weeks ago was getting out of of the scooter and traumatized his left lateral lower leg. There was [name] as the patient is on Plavix and Eliquis. They have been dressing this with Neosporin and doing [name] fairly good job. Wound measures 2.5 x 3.5 it does not have any depth he does not have [name] in his legs outside of surgery however he does have chronic edema and skin changes suggestive of chronic venous disease possibly some degree of lymphedema as well. Past medical history includes type 2 diabetes with peripheral neuropathy and gait instability, lumbar spondylosis, obstructive sleep apnea, history of [name], basal cell skin cancer, atrial fibrillation on anticoagulation, significant PAD as noted with [name] and [name] in 30th 2016. He also has [name] of coronary artery disease with an MI in 2002 hypertension hyperlipidemia and heart failure with preserved ejection fraction His last arterial studies I can see in epic were on 03/10/2018 this showed [name] and [name] with monophasic waveforms on the right. On the left his ABI was 1.20 with [name] and  triphasic waveforms. He has not had arterial studies since. Our nurse in the clinic got an ABI on the left of 1.1 11/2; left anterior leg wound in the setting of chronic venous insufficiency. Wound was initially trauma. We have been using Hydrofera Blue under compression he has home health.. The wound looks [name] lot better today with improvement in surface area 11/16; left anterior leg wound in  the setting of chronic venous insufficiency. Wound was initially trauma. We have been using Hydrofera Blue under compression. The patient is closed today. He is supposed to follow-up with vein and vascular with regards to arterial insufficiency nevertheless his leg wounds are 12/3; apparently 2 weeks ago when they were putting on their stockings they managed to get 3 wounds on the left anterior lower leg from abrasion when putting on the stockings. Home health came by the week of Thanksgiving and put Hydrofera Blue 4-layer wrap on this and there is only one superficial area remaining. The patient and his wife complained about the difficulties getting stockings on I think we are using 20/30. We will order bilateral external compression stockings which should be easier. 12/10; wound on the left anterior lower leg is closed. He has chronic venous insufficiency we ordered him Farrow wrap stockings unfortunately he did not bring these in. 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 He says that it has been present since mid December. He is currently residing in [facility] until March and [name] podiatrist has been looking after him there. They have been simply painting the area with Betadine. He has not had any lower extremity arterial studies since 2019, at which time his right ABI was 0.69. Measured in clinic today, it was 0.71. He is not aware of his most recent hemoglobin A1c, but historically he has had exceptionally poor control. On the basis of  his right first metatarsal, there is [name] small crescent shaped wound. There is surrounding eschar and callus. There is no malodor or purulent drainage. 09/08/2022: The original wound is smaller today and fairly clean, but there is some discoloration and [name] pulpy texture to the adjacent callus. Underneath this, the tissue is open exposing the fat layer. It looks like perhaps there was [name] crack in the callus and moisture got under the skin and caused breakdown. 09/15/2022: There has been more moisture related tissue breakdown. The callus is very soft and the underlying tissue is more open. 09/28/2022: The wound looks much better. He has done [name] good job keeping it dry. There is some callus overlying much of the wound surface. There is slough on the exposed open areas. 10/14/2022: There has been [name] lot of moisture related tissue breakdown. He is still forming callus over the top and then it seems that moisture gets underneath the callus and causes tissue damage. There is slough on the exposed wound surface. They will be moving back to the local area [place] the [place] this weekend. 10/21/2022: His foot is less wet, but there is no significant change to his wound. He has developed [name] blister on his left anterior tibial surface. There is no open wound here, but he does have some fairly significant edema. We are planning to apply [name] total contact cast today. 10/23/2022: Here for his obligatory first cast change. He says he has not had any issues wearing the cast or walking in the boot. No detrimental effects on his wound. [name], [name] ([id number]) [id number] Page 7 of 11 10/29/2022: The wound is looking better. Clearly, putting him in the cast has prevented water from getting in under his callus and causing further tissue breakdown. The blister on his left anterior tibial surface has not yet ruptured. Edema control is significantly improved. 11/05/2022: The wound on his right plantar foot is getting  better. There is still some callus accumulation around the wounds, but there  are just 2 open areas now, [name] very small 1 on the medial aspect of his metatarsal head and [name] little bit larger 1 on the plantar surface. The blister on his left anterior tibial surface is now open with hanging dry skin. 11/12/2022: The left anterior tibial wound is closed. The wound on his right plantar foot is smaller with just [name] little bit of callus around the edges. 11/19/2022: The right plantar foot wound continues to contract. The surface is quite clean. The tiny satellite area on the medial aspect of his foot has closed. 11/26/2022: The wound is smaller again today. He is responding well to the offloading effects of total contact casting. 12/03/2022: The wound is about the same size today. There is [name] little bit of moisture around the perimeter of the wound. No significant tissue breakdown. 12/10/2022: The wound is smaller today. There has been no moisture-related tissue breakdown of any maceration. 12/21/2022: The wound continues to contract. Moisture control is excellent. 12/28/2022: The wound is [name] little bit smaller today. 01/05/2023: The wound measures smaller today. Excellent moisture control with zero evidence of maceration. The wound surface is clean with healthy granulation tissue. 01/13/2023: We are leaving his Apligraf in situ for [name] second week. There is no discolored drainage or malodor coming from the site. 01/19/2023: The wound is smaller today. The periwound skin is in good condition without tissue maceration and the healed areas of tissue appear to have improved integrity. 01/27/2023: Continued contraction of the wound. No tissue maceration. The wound surface is flush with the surrounding skin. 02/02/2023: The wound measured [name] little bit smaller today. There is some bruising just adjacent to the wound, but this has not resulted in any tissue breakdown. Patient History Information obtained from Patient. Family  History Unknown History. Social History Never smoker, Marital Status - Single, Alcohol Use - Never, Drug Use - No History, Caffeine Use - Rarely. Medical History Respiratory Patient has history of Sleep Apnea Cardiovascular Patient has history of Arrhythmia - [name]-Fib, Coronary Artery Disease - s/p CABG, Deep Vein Thrombosis, Hypertension, Myocardial Infarction, Peripheral Arterial Disease - s/p Fempop x 2 Endocrine Patient has history of Type II Diabetes Neurologic Patient has history of Neuropathy Hospitalization/Surgery History - colonoscopy. - polypectomy. - peripheral vascular cath. - shoulder arthroscopy. - carpal tunnel release. - coronary artery bypass. - appendectomy. - cardiac cath. - coronary angioplasty. - thrombectomy. - knee arthroplasty. - popliteal artery stent. 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 Bradycardic, asymptomatic. no acute distress. Vitals Time Taken: 10:10 AM, Height: 70 in, Weight: 260 lbs, BMI: 37.3, Temperature: 98.0 F, Pulse: 52 bpm, Respiratory Rate: 20 breaths/min, Blood Pressure: 178/75 mmHg. Respiratory Normal work of breathing on room air. General Notes: 02/02/2023: The wound measured [name] little bit smaller today. There is some bruising just adjacent to the wound, but this has not resulted in any tissue breakdown. [name], [name] ([id number]) [id number] Page 8 of 11 Integumentary (Hair, Skin) Wound #2 status is Open. Original cause of wound was Gradually Appeared. The date acquired was: 07/08/2022. The wound has been in treatment 22 weeks. The wound is located on the Right,Plantar Foot. The wound measures 0.8cm length x 0.9cm width x 0.1cm depth; 0.565cm^2 area and 0.057cm^3 volume. There is Fat Layer (Subcutaneous Tissue) exposed. There is no tunneling or undermining  noted. There is [name] medium amount of serosanguineous drainage noted. The wound margin is distinct with the  outline attached to the wound base. There is large (67-100%) red granulation within the wound bed. There is [name] small (1-33%) amount of necrotic tissue within the wound bed including Eschar and Adherent Slough. The periwound skin appearance had no abnormalities noted for color. The periwound skin appearance exhibited: Callus. The periwound skin appearance did not exhibit: Maceration. Periwound temperature was noted as No Abnormality. Assessment Active Problems ICD-10 Non-pressure chronic ulcer of other part of right foot with fat layer exposed Peripheral vascular disease, unspecified Atherosclerotic heart disease of native coronary artery without angina pectoris Essential (primary) hypertension Type 2 diabetes mellitus with hyperglycemia Type 2 diabetes mellitus with diabetic neuropathy, unspecified Cerebral infarction, unspecified Procedures Wound #2 Pre-procedure diagnosis of Wound #2 is [name] Diabetic Wound/Ulcer of the Lower Extremity located on the Right,Plantar Foot. [name] skin graft procedure using [name] bioengineered skin substitute/cellular or tissue based product was performed by Duanne Guess, MD with the following instrument(s): Blade, Curette, Forceps, and Scissors. Epicord was applied and secured with Steri-Strips. 6 sq cm of product was utilized and 0 sq cm was wasted. Post Application, Adaptic,gauze was applied. [name] Time Out was conducted at 10:55, prior to the start of the procedure. The procedure was tolerated well with [name] pain level of 0 throughout and [name] pain level of 0 following the procedure. Post procedure Diagnosis Wound #2: Same as Pre-Procedure General Notes: Scribed for Dr. Lady Gary by Knute Neu. Pre-procedure diagnosis of Wound #2 is [name] Diabetic Wound/Ulcer of the Lower Extremity located on the Right,Plantar Foot . There was [name] T Contact Cast otal Procedure by Duanne Guess,  MD. Post procedure Diagnosis Wound #2: Same as Pre-Procedure Plan Follow-up Appointments: Return Appointment in 1 week. - Dr. Lady Gary - room 2 Tuesday 02/09/23 at 9:15am 02/16/23 at 9:15am Anesthetic: (In clinic) Topical Lidocaine 4% applied to wound bed Cellular or Tissue Based Products: Wound #2 Right,Plantar Foot: Cellular or Tissue Based Product Type: - Epicord #3 Cellular or Tissue Based Product applied to wound bed, secured with steri-strips, cover with Adaptic or Mepitel. (DO NOT REMOVE). Bathing/ Shower/ Hygiene: May shower with protection but do not get wound dressing(s) wet. Protect dressing(s) with water repellant cover (for example, large plastic bag) or [name] cast cover and may then take shower. Edema Control - Lymphedema / SCD / Other: Elevate legs to the level of the heart or above for 30 minutes daily and/or when sitting for 3-4 times [name] day throughout the day. Avoid standing for long periods of time. Patient to wear own compression stockings every day. Moisturize legs daily. Off-Loading: Removable cast walker boot to: - right leg WOUND #2: - Foot Wound Laterality: Plantar, Right Cleanser: Soap and Water 1 x Per Week/30 Days Discharge Instructions: May shower and wash wound with dial antibacterial soap and water prior to dressing change. Cleanser: Wound Cleanser 1 x Per Week/30 Days Discharge Instructions: Cleanse the wound with wound cleanser prior to applying [name] clean dressing using gauze sponges, not tissue or cotton balls. Peri-Wound Care: Zinc Oxide Ointment 30g tube 1 x Per Week/30 Days Discharge Instructions: Apply Zinc Oxide to periwound with each dressing change Prim Dressing: ADAPTIC TOUCH 3x4.25 (in/in) 1 x Per Week/30 Days ary Discharge Instructions: Apply to wound bed as instructed Prim Dressing: EPICORD 1 x Per Week/30 Days ary Discharge Instructions: #3 (02/02/23) Prim Dressing: TCC 1 x Per Week/30 Days ary Secondary Dressing: Woven Gauze Sponge, Non-Sterile  4x4 in 1 x Per Week/30 Days Discharge Instructions: Apply over primary dressing as directed. Secondary Dressing: Zetuvit Plus  4x8 in 1 x Per Week/30 Days Discharge Instructions: Apply over primary dressing as directed. Secured With: [employer], 4.5x3.1 (in/yd) 1 x Per Week/30 Days Discharge Instructions: Secure with Kerlix as directed. [name], [name] ([id number]) [id number] Page 9 of 11 02/02/2023: The wound measured [name] little bit smaller today. There is some bruising just adjacent to the wound, but this has not resulted in any tissue breakdown. The wound bed was prepared for Epicord application. The product was moistened with saline and applied in standard fashion to the wound. Zinc oxide was applied to the periwound and everything was secured with Adaptic and Steri-Strips. Will use foam donut to offload the area where I noted the bruising. [name] total contact cast was then applied in standard fashion. He will follow-up in 1 week. Electronic Signature(s) Signed: 02/02/2023 4:49:43 PM By: Karie Schwalbe RN Signed: 02/03/2023 12:38:39 PM By: Duanne Guess MD FACS Previous Signature: 02/02/2023 11:16:21 AM Version By: Duanne Guess MD FACS Entered By: Karie Schwalbe on 02/02/2023 16:42:18 -------------------------------------------------------------------------------- HxROS Details Patient Name: Date of Service: [name], [name]. 02/02/2023 10:15 [name] Medical Record Number: [id number] Patient Account Number: [id number] Date of Birth/Sex: Treating RN: Jul 31, 1955 (67 y.o. M) Primary Care Provider: Arva Chafe Other Clinician: Referring Provider: Treating Provider/Extender: [name] in Treatment: 22 Information Obtained From Patient Respiratory Medical History: Positive for: Sleep Apnea Cardiovascular Medical History: Positive for: Arrhythmia - [name]-Fib; Coronary Artery Disease - s/p CABG; Deep Vein Thrombosis;  Hypertension; Myocardial Infarction; Peripheral Arterial Disease - s/p Fempop x 2 Past Medical History Notes: CVA x 3 Endocrine Medical History: Positive for: Type II Diabetes Time with diabetes: since 1997 Treated with: Insulin, Oral agents Blood sugar tested every day: Yes Tested : 2-3x per day Musculoskeletal Medical History: Past Medical History Notes: carpal tunnel syndrome Neurologic Medical History: Positive for: Neuropathy Past Medical History Notes: stroke Oncologic Medical History: Past Medical History Notes: skin cancer Immunizations Louis, Izacc [name] ([id number]) [id number] Page 10 of 11 Pneumococcal Vaccine: Received Pneumococcal Vaccination: Yes Received Pneumococcal Vaccination On or After 60th Birthday: Yes Implantable Devices None Hospitalization / Surgery History Type of Hospitalization/Surgery colonoscopy polypectomy peripheral vascular cath shoulder arthroscopy carpal tunnel release coronary artery bypass appendectomy cardiac cath coronary angioplasty thrombectomy knee arthroplasty popliteal artery stent Family and Social History Unknown History: Yes; Never smoker; Marital Status - Single; Alcohol Use: Never; Drug Use: No History; Caffeine Use: Rarely; Financial Concerns: No; Food, Clothing or Shelter Needs: No; Support System Lacking: No; Transportation Concerns: No Electronic Signature(s) Signed: 02/02/2023 12:23:22 PM By: Duanne Guess MD FACS Entered By: Duanne Guess on 02/02/2023 11:14:13 -------------------------------------------------------------------------------- Total Contact Cast Details Patient Name: Date of Service: [name] [name]. 02/02/2023 10:15 [name] Medical Record Number: [id number] Patient Account Number: [id number] Date of Birth/Sex: Treating RN: [birth year] (67 y.o. [name] Primary Care Provider: Arva Chafe Other Clinician: Referring Provider: Treating Provider/Extender:  [name] in Treatment: 22 T Contact Cast Applied for Wound Assessment: otal Wound #2 Right,Plantar Foot Performed By: Physician Duanne Guess, MD Post Procedure Diagnosis Same as Pre-procedure Electronic Signature(s) Signed: 02/02/2023 12:23:22 PM By: Duanne Guess MD FACS Signed: 02/02/2023 4:09:39 PM By: Karie Schwalbe RN Entered By: Karie Schwalbe on 02/02/2023 10:52:07 -------------------------------------------------------------------------------- SuperBill Details Patient Name: Date of Service: [name], [name]. 02/02/2023 Medical Record Number: [id number] Patient Account Number: [id number] Date of Birth/Sex: Treating RN: [birth year] (67 y.o. M) Primary Care Provider: Arva Chafe Other Clinician: Referring Provider: Treating Provider/Extender: [name]  in Treatment: 22 Burling, Gilmar [name] ([id number]) [phone].pdf Page 11 of 11 Diagnosis Coding ICD-10 Codes Code Description L97.512 Non-pressure chronic ulcer of other part of right foot with fat layer exposed I73.9 Peripheral vascular disease, unspecified I25.10 Atherosclerotic heart disease of native coronary artery without angina pectoris I10 Essential (primary) hypertension E11.65 Type 2 diabetes mellitus with hyperglycemia E11.40 Type 2 diabetes mellitus with diabetic neuropathy, unspecified I63.9 Cerebral infarction, unspecified Facility Procedures : CPT4 Code: 25427062 Description: Q4187 Epicord 2cm x 3cm - per sqcm Modifier: Quantity: 6 : CPT4 Code: 37628315 Description: 15275 - SKIN SUB GRAFT FACE/NK/HF/G ICD-10 Diagnosis Description L97.512 Non-pressure chronic ulcer of other part of right foot with fat layer exposed Modifier: Quantity: 1 : CPT4 Code: 17616073 Description: 71062 - APPLY TOTAL CONTACT LEG CAST ICD-10 Diagnosis Description L97.512 Non-pressure chronic ulcer of other part of right foot with fat layer  exposed Modifier: Quantity: 1 Physician Procedures : CPT4 Code Description Modifier [id number] 99213 - WC PHYS LEVEL 3 - EST PT 25 ICD-10 Diagnosis Description L97.512 Non-pressure chronic ulcer of other part of right foot with fat layer exposed I73.9 Peripheral vascular disease, unspecified E11.40 Type 2  diabetes mellitus with diabetic neuropathy, unspecified E11.65 Type 2 diabetes mellitus with hyperglycemia Quantity: 1 : 2703500 15275 - WC PHYS SKIN SUB GRAFT FACE/NK/HF/G ICD-10 Diagnosis Description L97.512 Non-pressure chronic ulcer of other part of right foot with fat layer exposed Quantity: 1 : [id number] 29445 - WC PHYS APPLY TOTAL CONTACT CAST ICD-10 Diagnosis Description L97.512 Non-pressure chronic ulcer of other part of right foot with fat layer exposed Quantity: 1 Electronic Signature(s) Signed: 02/02/2023 11:16:50 AM By: Duanne Guess MD FACS Entered By: Duanne Guess on 02/02/2023 11:16:49

## 2023-02-03 ENCOUNTER — Encounter: Payer: Medicare HMO | Admitting: *Deleted

## 2023-02-03 DIAGNOSIS — Z006 Encounter for examination for normal comparison and control in clinical research program: Secondary | ICD-10-CM

## 2023-02-03 MED ORDER — STUDY - ORION 4 - INCLISIRAN 300 MG/1.5 ML OR PLACEBO SQ INJECTION (PI-STUCKEY)
300.0000 mg | INJECTION | SUBCUTANEOUS | Status: DC
  Administered 2023-02-03: 300 mg via SUBCUTANEOUS
  Filled 2023-02-03: qty 1.5

## 2023-02-03 NOTE — Research (Signed)
[facility] visit  No complaints per patient  Only med change was started ozempic  Injection given at 1005 RUA  See patient back Aug 09, 2023 @ 0930    Current Outpatient Medications:    apixaban (ELIQUIS) 5 MG TABS tablet, Take 1 tablet (5 mg total) by mouth 2 (two) times daily for 14 days., Disp: 28 tablet, Rfl: 0   BD INSULIN SYRINGE U/F 31G X 5/16" 1 ML MISC, AS DIRECTED 3 TIMES A DAY SUBCUTANEOUS 30, Disp: , Rfl:    clopidogrel (PLAVIX) 75 MG tablet, TAKE 1 TABLET BY MOUTH EVERY DAY, Disp: 90 tablet, Rfl: 3   empagliflozin (JARDIANCE) 10 MG TABS tablet, Take 10 mg by mouth daily., Disp: , Rfl:    fenofibrate (TRICOR) 145 MG tablet, Take 1 tablet (145 mg total) by mouth daily., Disp: 90 tablet, Rfl: 3   furosemide (LASIX) 40 MG tablet, Take 1 tablet (40 mg total) by mouth 2 (two) times daily., Disp: 90 tablet, Rfl: 1   gabapentin (NEURONTIN) 300 MG capsule, Take 300 mg by mouth 2 (two) times daily. , Disp: , Rfl:    icosapent Ethyl (VASCEPA) 1 g capsule, TAKE 2 CAPSULES BY MOUTH 2 TIMES DAILY., Disp: 360 capsule, Rfl: 1   lisinopril (ZESTRIL) 20 MG tablet, Take 20 mg by mouth daily., Disp: , Rfl:    metFORMIN (GLUCOPHAGE) 500 MG tablet, Take 500 mg by mouth 2 (two) times daily., Disp: , Rfl:    methocarbamol (ROBAXIN) 500 MG tablet, TAKE 1 TABLET BY MOUTH EVERY 6 HOURS AS NEEDED FOR MUSCLE SPASMS., Disp: 60 tablet, Rfl: 1   metoprolol succinate (TOPROL-XL) 25 MG 24 hr tablet, TAKE 1 TABLET (25 MG TOTAL) BY MOUTH DAILY., Disp: 90 tablet, Rfl: 3   NOVOLIN 70/30 KWIKPEN (70-30) 100 UNIT/ML KwikPen, Inject 45-75 Units into the skin See admin instructions. Inject 65-75 units with breakfast, 45-65 units at lunch and 75 units at dinner, Disp: , Rfl:    ONETOUCH VERIO test strip, AS DIRECTED 3 TIMES A DAY 30, Disp: , Rfl:    potassium chloride SA (KLOR-CON M20) 20 MEQ tablet, TAKE 1 TABLET BY MOUTH TWICE A DAY, Disp: 180 tablet, Rfl: 0   rosuvastatin (CRESTOR) 40 MG tablet, Take 40 mg by mouth  daily., Disp: , Rfl:    Semaglutide,0.25 or 0.5MG /DOS, (OZEMPIC, 0.25 OR 0.5 MG/DOSE,) 2 MG/3ML SOPN, Inject 0.25 mg into the skin once a week., Disp: , Rfl:    Study - [facility] 4 - inclisiran 300 mg/1.35mL or placebo SQ injection (PI-Stuckey), Inject 1.5 mLs (300 mg total) into the skin every 6 (six) months., Disp: 1 mL, Rfl: 1  Current Facility-Administered Medications:    Study - [facility] - inclisiran 300 mg/1.57mL or placebo SQ injection (PI-Stuckey), 300 mg, Subcutaneous, Q6 months, , 300 mg at 02/03/23 1005

## 2023-02-04 ENCOUNTER — Other Ambulatory Visit: Payer: Self-pay | Admitting: Gastroenterology

## 2023-02-05 ENCOUNTER — Ambulatory Visit (INDEPENDENT_AMBULATORY_CARE_PROVIDER_SITE_OTHER): Payer: Medicare HMO | Admitting: Gastroenterology

## 2023-02-05 ENCOUNTER — Encounter: Payer: Self-pay | Admitting: Gastroenterology

## 2023-02-05 ENCOUNTER — Telehealth: Payer: Self-pay | Admitting: *Deleted

## 2023-02-05 VITALS — BP 134/80 | HR 73 | Ht 70.0 in | Wt 266.0 lb

## 2023-02-05 DIAGNOSIS — Z86010 Personal history of colonic polyps: Secondary | ICD-10-CM | POA: Diagnosis not present

## 2023-02-05 DIAGNOSIS — Z1211 Encounter for screening for malignant neoplasm of colon: Secondary | ICD-10-CM

## 2023-02-05 DIAGNOSIS — Z7902 Long term (current) use of antithrombotics/antiplatelets: Secondary | ICD-10-CM

## 2023-02-05 DIAGNOSIS — G4733 Obstructive sleep apnea (adult) (pediatric): Secondary | ICD-10-CM
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
## 2023-02-05 NOTE — Telephone Encounter (Signed)
 [facility] Pre-operative Risk Assessment     Request for surgical clearance:     Endoscopy Procedure  What type of surgery is being performed?    Colonoscopy  When is this surgery scheduled?     02/25/2023  What type of clearance is required ?   Pharmacy  Are there any medications that need to be held prior to surgery and how long? Plavix 5 days  Eliquis 2 days  Practice name and name of physician performing surgery?      [facility]  Dr Amada Jupiter   What is your office phone and fax number?      Phone- [phone]  Fax- [phone]  Anesthesia type (None, local, MAC, general) ?       MAC

## 2023-02-05 NOTE — Patient Instructions (Signed)
You have been scheduled for a colonoscopy at [facility] on 02/25/2023 at 8:30 am, Separate instructions have been given  Due to recent changes in healthcare laws, you may see the results of your imaging and laboratory studies on MyChart before your provider has had a chance to review them.  We understand that in some cases there may be results that are confusing or concerning to you. Not all laboratory results come back in the same time frame and the provider may be waiting for multiple results in order to interpret others.  Please give [place] 48 hours in order for your provider to thoroughly review all the results before contacting the office for clarification of your results.    _______________________________________________________  If your blood pressure at your visit was 140/90 or greater, please contact your primary care physician to follow up on this.  _______________________________________________________  If you are age 69 or older, your body mass index should be between 23-30. Your Body mass index is 38.17 kg/m. If this is out of the aforementioned range listed, please consider follow up with your Primary Care Provider.  If you are age 68 or younger, your body mass index should be between 19-25. Your Body mass index is 38.17 kg/m. If this is out of the aformentioned range listed, please consider follow up with your Primary Care Provider.   ________________________________________________________  The [facility] providers would like to encourage you to use [facility] to communicate with providers for non-urgent requests or questions.  Due to long hold times on the telephone, sending your provider a message by [facility] may be a faster and more efficient way to get a response.  Please allow 48 business hours for a response.  Please remember that this is for non-urgent requests.  _______________________________________________________   I appreciate the  opportunity to care for you  Thank You    [facility]

## 2023-02-05 NOTE — Progress Notes (Signed)
____________________________________________________________  Attending physician addendum:  Thank you for sending this case to me. I have reviewed the entire note and agree with the plan.  Thank you for seeing him.  Amada Jupiter, MD  ____________________________________________________________

## 2023-02-08 ENCOUNTER — Telehealth: Payer: Self-pay | Admitting: *Deleted

## 2023-02-08 NOTE — Telephone Encounter (Signed)
Patient with diagnosis of AF on Eliquis for anticoagulation.    Procedure: colonoscopy Date of procedure: 02/25/23   CHA2DS2-VASc Score = 6   This indicates a 9.7% annual risk of stroke. The patient's score is based upon: CHF History: 0 HTN History: 1 Diabetes History: 1 Stroke History: 2 Vascular Disease History: 1 Age Score: 1 Gender Score: 0   Per chart had ischemic strokes March and July 2017  CrCl 78 Platelet count 266  Per office protocol, patient can hold Eliquis for 1 days prior to procedure.   Patient will not need bridging with Lovenox (enoxaparin) around procedure.  **This guidance is not considered finalized until pre-operative APP has relayed final recommendations.**

## 2023-02-08 NOTE — Telephone Encounter (Signed)
Left message to call back to schedule a tele pre op appt.  ?

## 2023-02-08 NOTE — Telephone Encounter (Signed)
   Name: [name]  DOB: 04/01/1956  MRN: [id number]  Primary Cardiologist: Nanetta Batty, MD   Preoperative team, please contact this patient and set up a phone call appointment for further preoperative risk assessment. Please obtain consent and complete medication review. Thank you for your help.  I confirm that guidance regarding antiplatelet and oral anticoagulation therapy has been completed and, if necessary, noted below.  Per office protocol, patient can hold Eliquis for 1 days prior to procedure.   Patient will not need bridging with Lovenox (enoxaparin) around procedure. Per office protocol, if patient is without any new symptoms or concerns at the time of their virtual visit, he/she may hold Plavix for 5 days prior to procedure. Please resume Plavix as soon as possible postprocedure, at the discretion of the surgeon.     Joni Reining, NP 02/08/2023, 8:57 AM [facility]

## 2023-02-08 NOTE — Telephone Encounter (Signed)
Patient scheduled 02-16-23 consent and med rec done

## 2023-02-08 NOTE — Telephone Encounter (Signed)
  Patient Consent for Virtual Visit        [name] has provided verbal consent on 02/08/2023 for a virtual visit (video or telephone).   CONSENT FOR VIRTUAL VISIT FOR:  [name]  By participating in this virtual visit I agree to the following:  I hereby voluntarily request, consent and authorize [facility] and its employed or contracted physicians, physician assistants, nurse practitioners or other licensed health care professionals (the Practitioner), to provide me with telemedicine health care services (the "Services") as deemed necessary by the treating Practitioner. I acknowledge and consent to receive the Services by the Practitioner via telemedicine. I understand that the telemedicine visit will involve communicating with the Practitioner through live audiovisual communication technology and the disclosure of certain medical information by electronic transmission. I acknowledge that I have been given the opportunity to request an in-person assessment or other available alternative prior to the telemedicine visit and am voluntarily participating in the telemedicine visit.  I understand that I have the right to withhold or withdraw my consent to the use of telemedicine in the course of my care at any time, without affecting my right to future care or treatment, and that the Practitioner or I may terminate the telemedicine visit at any time. I understand that I have the right to inspect all information obtained and/or recorded in the course of the telemedicine visit and may receive copies of available information for a reasonable fee.  I understand that some of the potential risks of receiving the Services via telemedicine include:  Delay or interruption in medical evaluation due to technological equipment failure or disruption; Information transmitted may not be sufficient (e.g. poor resolution of images) to allow for appropriate medical decision making by the Practitioner;  and/or  In rare instances, security protocols could fail, causing a breach of personal health information.  Furthermore, I acknowledge that it is my responsibility to provide information about my medical history, conditions and care that is complete and accurate to the best of my ability. I acknowledge that Practitioner's advice, recommendations, and/or decision may be based on factors not within their control, such as incomplete or inaccurate data provided by me or distortions of diagnostic images or specimens that may result from electronic transmissions. I understand that the practice of medicine is not an exact science and that Practitioner makes no warranties or guarantees regarding treatment outcomes. I acknowledge that a copy of this consent can be made available to me via my patient portal [facility] MyChart), or I can request a printed copy by calling the office of [facility].    I understand that my insurance will be billed for this visit.   I have read or had this consent read to me. I understand the contents of this consent, which adequately explains the benefits and risks of the Services being provided via telemedicine.  I have been provided ample opportunity to ask questions regarding this consent and the Services and have had my questions answered to my satisfaction. I give my informed consent for the services to be provided through the use of telemedicine in my medical care

## 2023-02-09 ENCOUNTER — Encounter (HOSPITAL_BASED_OUTPATIENT_CLINIC_OR_DEPARTMENT_OTHER): Payer: Medicare HMO | Admitting: General Surgery

## 2023-02-09 DIAGNOSIS — E1140 Type 2 diabetes mellitus with diabetic neuropathy, unspecified: Secondary | ICD-10-CM | POA: Diagnosis not present

## 2023-02-09 DIAGNOSIS — I2510 Atherosclerotic heart disease of native coronary artery without angina pectoris: Secondary | ICD-10-CM | POA: Diagnosis not present

## 2023-02-09 DIAGNOSIS — G4733 Obstructive sleep apnea (adult) (pediatric): Secondary | ICD-10-CM | POA: Diagnosis not present

## 2023-02-09 DIAGNOSIS — Z85828 Personal history of other malignant neoplasm of skin: Secondary | ICD-10-CM | POA: Diagnosis not present

## 2023-02-09 DIAGNOSIS — E11621 Type 2 diabetes mellitus with foot ulcer: Secondary | ICD-10-CM | POA: Diagnosis not present

## 2023-02-09 DIAGNOSIS — E1165 Type 2 diabetes mellitus with hyperglycemia: Secondary | ICD-10-CM | POA: Diagnosis not present

## 2023-02-09 DIAGNOSIS — I110 Hypertensive heart disease with heart failure: Secondary | ICD-10-CM | POA: Diagnosis not present

## 2023-02-09 DIAGNOSIS — Z7902 Long term (current) use of antithrombotics/antiplatelets: Secondary | ICD-10-CM | POA: Diagnosis not present

## 2023-02-09 DIAGNOSIS — I4891 Unspecified atrial fibrillation: Secondary | ICD-10-CM | POA: Diagnosis not present

## 2023-02-09 DIAGNOSIS — I5032 Chronic diastolic (congestive) heart failure: Secondary | ICD-10-CM | POA: Diagnosis not present

## 2023-02-09 DIAGNOSIS — Z7901 Long term (current) use of anticoagulants: Secondary | ICD-10-CM | POA: Diagnosis not present

## 2023-02-09 DIAGNOSIS — E1151 Type 2 diabetes mellitus with diabetic peripheral angiopathy without gangrene: Secondary | ICD-10-CM | POA: Diagnosis not present

## 2023-02-09 DIAGNOSIS — L97512 Non-pressure chronic ulcer of other part of right foot with fat layer exposed: Secondary | ICD-10-CM | POA: Diagnosis not present

## 2023-02-09 NOTE — Progress Notes (Signed)
Hyde, Jamieon A ([id number]) [id number] Page 1 of 7 Visit Report for 02/09/2023 Arrival Information Details Patient Name: Date of Service: [place], [name] 02/09/2023 9:15 A M Medical Record Number: [id number] Patient Account Number: [id number] Date of Birth/Sex: Treating RN: 1955-12-30 (67 y.o. [name] Primary Care Jeilyn Reznik: Arva Chafe Other Clinician: Referring Violette Morneault: Treating Harlei Lehrmann/Extender: [name] in Treatment: 23 Visit Information History Since Last Visit Added or deleted any medications: No Patient Arrived: Ambulatory Any new allergies or adverse reactions: No Arrival Time: 09:19 Had a fall or experienced change in No Accompanied By: partner activities of daily living that may affect Transfer Assistance: None risk of falls: Patient Identification Verified: Yes Signs or symptoms of abuse/neglect since last visito No Secondary Verification Process Completed: Yes Hospitalized since last visit: No Patient Requires Transmission-Based Precautions: No Implantable device outside of the clinic excluding No Patient Has Alerts: No cellular tissue based products placed in the center since last visit: Has Dressing in Place as Prescribed: Yes Has Footwear/Offloading in Place as Prescribed: Yes Right: T Contact Cast otal Pain Present Now: No Electronic Signature(s) Signed: 02/09/2023 3:57:44 PM By: Samuella Bruin Entered By: Samuella Bruin on 02/09/2023 09:20:08 -------------------------------------------------------------------------------- Encounter Discharge Information Details Patient Name: Date of Service: [name], [name] 02/09/2023 9:15 A M Medical Record Number: [id number] Patient Account Number: [id number] Date of Birth/Sex: Treating RN: 29-Dec-1955 (66 y.o. [name] Primary Care Anetta Olvera: Arva Chafe Other Clinician: Referring Savilla Turbyfill: Treating Rihan Schueler/Extender:  [name] in Treatment: 23 Encounter Discharge Information Items Post Procedure Vitals Discharge Condition: Stable Temperature (F): 98.1 Ambulatory Status: Ambulatory Pulse (bpm): 64 Discharge Destination: Home Respiratory Rate (breaths/min): 18 Transportation: Private Auto Blood Pressure (mmHg): 193/67 Accompanied By: self Schedule Follow-up Appointment: Yes Clinical Summary of Care: Patient Declined Electronic Signature(s) Signed: 02/09/2023 3:57:44 PM By: Samuella Bruin Entered By: Samuella Bruin on 02/09/2023 09:46:16 Lalone, Isabella A ([id number]) [id number] Page 2 of 7 -------------------------------------------------------------------------------- Lower Extremity Assessment Details Patient Name: Date of Service: [place], [name] 02/09/2023 9:15 A M Medical Record Number: [id number] Patient Account Number: [id number] Date of Birth/Sex: Treating RN: [birth year] (67 y.o. [name] Primary Care Richanda Darin: Arva Chafe Other Clinician: Referring Kalene Cutler: Treating Jagger Beahm/Extender: [name] in Treatment: 23 Edema Assessment Assessed: [name]: No] [name]: No] Edema: [Left: Ye] [Right: s] Calf Left: Right: Point of Measurement: From Medial Instep 37.2 cm Ankle Left: Right: Point of Measurement: From Medial Instep 26 cm Vascular Assessment Pulses: Dorsalis Pedis Palpable: [Right:Yes] Extremity colors, hair growth, and conditions: Extremity Color: [Right:Normal] Hair Growth on Extremity: [Right:Yes Warm] Electronic Signature(s) Signed: 02/09/2023 3:57:44 PM By: Samuella Bruin Entered By: Samuella Bruin on 02/09/2023 09:33:42 -------------------------------------------------------------------------------- Multi Wound Chart Details Patient Name: Date of Service: [name], [name] 02/09/2023 9:15 A M Medical Record Number: [id number] Patient Account Number:  [id number] Date of Birth/Sex: Treating RN: 1955/09/15 (67 y.o. M) Primary Care Sahej Schrieber: Arva Chafe Other Clinician: Referring Adalee Kathan: Treating [place] Shannahan/Extender: [name] in Treatment: 23 Vital Signs Height(in): 70 Pulse(bpm): 64 Weight(lbs): 260 Blood Pressure(mmHg): 193/67 Body Mass Index(BMI): 37.3 Temperature(F): 98.1 Respiratory Rate(breaths/min): 18 [2:Photos:] [N/A:N/A] Right, Plantar Foot N/A N/A Wound Location: Gradually Appeared N/A N/A Wounding Event: Diabetic Wound/Ulcer of the Lower N/A N/A Primary Etiology: Extremity Sleep Apnea, Arrhythmia, Coronary N/A N/A Comorbid History: Artery Disease, Deep Vein Thrombosis, Hypertension, Myocardial Infarction, Peripheral Arterial Disease, Type II Diabetes, Neuropathy 07/08/2022 N/A N/A Date Acquired: 23 N/A N/A Weeks of Treatment: Open N/A N/A Wound  Status: No N/A N/A Wound Recurrence: 0.4x0.7x0.1 N/A N/A Measurements L x W x D (cm) 0.22 N/A N/A A (cm) : rea 0.022 N/A N/A Volume (cm) : 91.40% N/A N/A % Reduction in A rea: 91.40% N/A N/A % Reduction in Volume: Grade 1 N/A N/A Classification: Medium N/A N/A Exudate A mount: Serosanguineous N/A N/A Exudate Type: red, brown N/A N/A Exudate Color: Distinct, outline attached N/A N/A Wound Margin: Large (67-100%) N/A N/A Granulation A mount: Red, Pink N/A N/A Granulation Quality: Small (1-33%) N/A N/A Necrotic A mount: Fat Layer (Subcutaneous Tissue): Yes N/A N/A Exposed Structures: Fascia: No Tendon: No Muscle: No Joint: No Bone: No Medium (34-66%) N/A N/A Epithelialization: Callus: Yes N/A N/A Periwound Skin Texture: Maceration: No N/A N/A Periwound Skin Moisture: No Abnormalities Noted N/A N/A Periwound Skin Color: No Abnormality N/A N/A Temperature: Cellular or Tissue Based Product N/A N/A Procedures Performed: T Contact Cast otal Treatment Notes Wound #2 (Foot) Wound Laterality: Plantar,  Right Cleanser Soap and Water Discharge Instruction: May shower and wash wound with dial antibacterial soap and water prior to dressing change. Wound Cleanser Discharge Instruction: Cleanse the wound with wound cleanser prior to applying a clean dressing using gauze sponges, not tissue or cotton balls. Peri-Wound Care Zinc Oxide Ointment 30g tube Discharge Instruction: Apply Zinc Oxide to periwound with each dressing change Topical Primary Dressing ADAPTIC TOUCH 3x4.25 (in/in) Discharge Instruction: Apply to wound bed as instructed EPICORD Discharge Instruction: #3 (02/02/23) TCC Secondary Dressing Woven Gauze Sponge, Non-Sterile 4x4 in Discharge Instruction: Apply over primary dressing as directed. Zetuvit Plus 4x8 in Discharge Instruction: Apply over primary dressing as directed. Secured With [employer], 4.5x3.1 (in/yd) Discharge Instruction: Secure with Kerlix as directed. Compression Wrap Compression Stockings Add-Ons Welsch, Noe A ([id number]) [phone].pdf Page 4 of 7 Electronic Signature(s) Signed: 02/09/2023 9:49:56 AM By: Duanne Guess MD FACS Entered By: Duanne Guess on 02/09/2023 09:49:56 -------------------------------------------------------------------------------- Multi-Disciplinary Care Plan Details Patient Name: Date of Service: [name], [name] 02/09/2023 9:15 A M Medical Record Number: [id number] Patient Account Number: [id number] Date of Birth/Sex: Treating RN: 08/05/1955 (67 y.o. [name] Primary Care Lacy Sofia: Arva Chafe Other Clinician: Referring Gerron Guidotti: Treating Aditya Nastasi/Extender: [name] in Treatment: 23 Multidisciplinary Care Plan reviewed with physician Active Inactive Abuse / Safety / Falls / Self Care Management Nursing Diagnoses: Impaired physical mobility Potential for falls Goals: Patient will not experience any injury related to falls Date  Initiated: 08/31/2022 Target Resolution Date: 02/19/2023 Goal Status: Active Patient/caregiver will verbalize/demonstrate measures taken to improve the patient's personal safety Date Initiated: 08/31/2022 Target Resolution Date: 02/19/2023 Goal Status: Active Interventions: Provide education on basic hygiene Provide education on fall prevention Notes: Electronic Signature(s) Signed: 02/09/2023 3:57:44 PM By: Samuella Bruin Entered By: Samuella Bruin on 02/09/2023 09:38:21 -------------------------------------------------------------------------------- Pain Assessment Details Patient Name: Date of Service: [name], [name] 02/09/2023 9:15 A M Medical Record Number: [id number] Patient Account Number: [id number] Date of Birth/Sex: Treating RN: [birth year] (67 y.o. [name] Primary Care Adonnis Salceda: Arva Chafe Other Clinician: Referring Jovane Foutz: Treating Desteni Piscopo/Extender: [name] in Treatment: 23 Active Problems Location of Pain Severity and Description of Pain Patient Has Paino No Site Locations [place], [place] A ([id number]) [phone].pdf Page 5 of 7 Rate the pain. Current Pain Level: 0 Pain Management and Medication Current Pain Management: Electronic Signature(s) Signed: 02/09/2023 3:57:44 PM By: Samuella Bruin Entered By: Samuella Bruin on 02/09/2023 09:21:59 -------------------------------------------------------------------------------- Patient/Caregiver Education Details Patient Name: Date of Service: [place], [name] 7/23/2024andnbsp9:15  A M Medical Record Number: [id number] Patient Account Number: [id number] Date of Birth/Gender: Treating RN: [birth year] (67 y.o. [name] Primary Care Physician: Arva Chafe Other Clinician: Referring Physician: Treating Physician/Extender: [name] in Treatment: 23 Education Assessment Education Provided  To: Patient Education Topics Provided Wound/Skin Impairment: Methods: Explain/Verbal Responses: Reinforcements needed, State content correctly Electronic Signature(s) Signed: 02/09/2023 3:57:44 PM By: Samuella Bruin Entered By: Samuella Bruin on 02/09/2023 09:38:31 -------------------------------------------------------------------------------- Wound Assessment Details Patient Name: Date of Service: [name], [name] 02/09/2023 9:15 A M Medical Record Number: [id number] Patient Account Number: [id number] Date of Birth/Sex: Treating RN: May 04, 1956 (67 y.o. [name] Primary Care Mikias Lanz: Arva Chafe Other Clinician: Referring Calirose Mccance: Treating Rhemi Balbach/Extender: Ocie Doyne [place], [place] A ([id number]) [id number] Page 6 of 7 Weeks in Treatment: 23 Wound Status Wound Number: 2 Primary Diabetic Wound/Ulcer of the Lower Extremity Etiology: Wound Location: Right, Plantar Foot Wound Open Wounding Event: Gradually Appeared Status: Date Acquired: 07/08/2022 Comorbid Sleep Apnea, Arrhythmia, Coronary Artery Disease, Deep Vein Weeks Of Treatment: 23 History: Thrombosis, Hypertension, Myocardial Infarction, Peripheral Arterial Clustered Wound: No Disease, Type II Diabetes, Neuropathy Photos Wound Measurements Length: (cm) 0.4 Width: (cm) 0.7 Depth: (cm) 0.1 Area: (cm) 0.22 Volume: (cm) 0.022 % Reduction in Area: 91.4% % Reduction in Volume: 91.4% Epithelialization: Medium (34-66%) Tunneling: No Undermining: No Wound Description Classification: Grade 1 Wound Margin: Distinct, outline attached Exudate Amount: Medium Exudate Type: Serosanguineous Exudate Color: red, brown Foul Odor After Cleansing: No Slough/Fibrino Yes Wound Bed Granulation Amount: Large (67-100%) Exposed Structure Granulation Quality: Red, Pink Fascia Exposed: No Necrotic Amount: Small (1-33%) Fat Layer (Subcutaneous Tissue) Exposed:  Yes Necrotic Quality: Adherent Slough Tendon Exposed: No Muscle Exposed: No Joint Exposed: No Bone Exposed: No Periwound Skin Texture Texture Color No Abnormalities Noted: No No Abnormalities Noted: Yes Callus: Yes Temperature / Pain Temperature: No Abnormality Moisture No Abnormalities Noted: No Maceration: No Treatment Notes Wound #2 (Foot) Wound Laterality: Plantar, Right Cleanser Soap and Water Discharge Instruction: May shower and wash wound with dial antibacterial soap and water prior to dressing change. Wound Cleanser Discharge Instruction: Cleanse the wound with wound cleanser prior to applying a clean dressing using gauze sponges, not tissue or cotton balls. Peri-Wound Care Zinc Oxide Ointment 30g tube Discharge Instruction: Apply Zinc Oxide to periwound with each dressing change Topical Primary Dressing Leavell, Eliezer A ([id number]) [id number] Page 7 of 7 ADAPTIC TOUCH 3x4.25 (in/in) Discharge Instruction: Apply to wound bed as instructed EPICORD Discharge Instruction: #3 (02/02/23) TCC Secondary Dressing Woven Gauze Sponge, Non-Sterile 4x4 in Discharge Instruction: Apply over primary dressing as directed. Zetuvit Plus 4x8 in Discharge Instruction: Apply over primary dressing as directed. Secured With [employer], 4.5x3.1 (in/yd) Discharge Instruction: Secure with Kerlix as directed. Compression Wrap Compression Stockings Add-Ons Electronic Signature(s) Signed: 02/09/2023 3:57:44 PM By: Samuella Bruin Entered By: Samuella Bruin on 02/09/2023 09:36:00 -------------------------------------------------------------------------------- Vitals Details Patient Name: Date of Service: [name], [name] 02/09/2023 9:15 A M Medical Record Number: [id number] Patient Account Number: [id number] Date of Birth/Sex: Treating RN: 1956/03/03 (67 y.o. [name] Primary Care Joseline Mccampbell: Arva Chafe Other Clinician: Referring  Emrick Hensch: Treating [place] Greb/Extender: [name] in Treatment: 23 Vital Signs Time Taken: 09:22 Temperature (F): 98.1 Height (in): 70 Pulse (bpm): 64 Weight (lbs): 260 Respiratory Rate (breaths/min): 18 Body Mass Index (BMI): 37.3 Blood Pressure (mmHg): 193/67 Reference Range: 80 - 120 mg / dl Electronic Signature(s) Signed: 02/09/2023 3:57:44 PM By: Samuella Bruin Entered By: Samuella Bruin on  02/09/2023 09:22:36 

## 2023-02-09 NOTE — Progress Notes (Signed)
[name], [name] ([id number]) [id number] Page 1 of 11 Visit Report for 02/09/2023 Chief Complaint Document Details Patient Name: Date of Service: [name], [name] [name] 02/09/2023 9:15 A M Medical Record Number: [id number] Patient Account Number: [id number] Date of Birth/Sex: Treating RN: 11-28-1955 (67 y.o. M) Primary Care Provider: Arva Chafe Other Clinician: Referring Provider: Treating Provider/Extender: [name] in Treatment: 23 Information Obtained from: Patient Chief Complaint 05/14/2020; patient is here for review of an abrasion injury on the left lateral calf 08/31/2022: [name] (1st metatarsal base, plantar) Electronic Signature(s) Signed: 02/09/2023 9:50:19 AM By: Duanne Guess MD FACS Entered By: Duanne Guess on 02/09/2023 09:50:19 -------------------------------------------------------------------------------- Cellular or Tissue Based Product Details Patient Name: Date of Service: [name], [name] 02/09/2023 9:15 A M Medical Record Number: [id number] Patient Account Number: [id number] Date of Birth/Sex: Treating RN: 1955-07-23 (67 y.o. M) Primary Care Provider: Arva Chafe Other Clinician: Referring Provider: Treating Provider/Extender: [name] in Treatment: 23 Cellular or Tissue Based Product Type Wound #2 Right,Plantar Foot Applied to: Performed By: Physician Duanne Guess, MD Cellular or Tissue Based Product Type: Epicord Level of Consciousness (Pre-procedure): Awake and Alert Pre-procedure Verification/Time Out Yes - 09:42 Taken: Location: genitalia / hands / feet / multiple digits Wound Size (sq cm): 0.28 Product Size (sq cm): 6 Waste Size (sq cm): 3 Waste Reason: wound size Amount of Product Applied (sq cm): 3 Instrument Used: Forceps, Scissors Lot #: [id number] Expiration Date: 07/21/2027 Fenestrated: No Reconstituted: Yes Solution Type:  normal saline Solution Amount: 4 ml Lot #: [id number] KS Solution Expiration Date: 08/10/2024 Secured: Yes Secured With: Steri-Strips Dressing Applied: Yes Primary Dressing: adaptic Procedural Pain: 0 Post Procedural Pain: 0 Hobbins, Josejulian A ([id number]) [phone].pdf Page 2 of 11 Response to Treatment: Procedure was tolerated well Level of Consciousness (Post- Awake and Alert procedure): Post Procedure Diagnosis Same as Pre-procedure Notes scribed for Dr. Lady Gary by Samuella Bruin, RN Electronic Signature(s) Signed: 02/09/2023 9:50:05 AM By: Duanne Guess MD FACS Entered By: Duanne Guess on 02/09/2023 09:50:05 -------------------------------------------------------------------------------- HPI Details Patient Name: Date of Service: [name] Pian, [name] 02/09/2023 9:15 A M Medical Record Number: [id number] Patient Account Number: [id number] Date of Birth/Sex: Treating RN: Nov 25, 1955 (67 y.o. M) Primary Care Provider: Arva Chafe Other Clinician: Referring Provider: Treating Provider/Extender: [name] in Treatment: 23 History of Present Illness HPI Description: ADMISSION 05/14/2020; this is a 67 year old man with multiple medical issues. Predominantly he has type 2 diabetes with a history of peripheral neuropathy and also history of fairly significant PAD. He had a left superficial femoral to posterior tibial artery bypass in February 2017 he also had an atherectomy and angioplasty by Dr. [name] of the right popliteal artery in 2016. He is supposed to be getting arterial studies annually however this was interrupted last year because of the pandemic. He tells [place] he was at [facility] 2 weeks ago was getting out of of the scooter and traumatized his left lateral lower leg. There was a lot of bleeding as the patient is on Plavix and Eliquis. They have been dressing this with Neosporin and doing a fairly good job. Wound measures  2.5 x 3.5 it does not have any depth he does not have a wound history in his legs outside of surgery however he does have chronic edema and skin changes suggestive of chronic venous disease possibly some degree of lymphedema as well. Past medical history includes type 2 diabetes with peripheral neuropathy and gait instability, lumbar spondylosis, obstructive sleep  apnea, history of a left pontine CVA, basal cell skin cancer, atrial fibrillation on anticoagulation, significant PAD as noted with a left superficial femoral to posterior tibial bypass in February 2017 and a right popliteal atherectomy and angioplasty by Dr. [name] in 30th 2016. He also has a history of coronary artery disease with an MI in 2002 hypertension hyperlipidemia and heart failure with preserved ejection fraction His last arterial studies I can see in epic were on 03/10/2018 this showed a right ABI of 0.69 and a right TBI of 0.5 with monophasic waveforms on the right. On the left his ABI was 1.20 with a TBI of 0.92 and triphasic waveforms. He has not had arterial studies since. Our nurse in the clinic got an ABI on the left of 1.1 11/2; left anterior leg wound in the setting of chronic venous insufficiency. Wound was initially trauma. We have been using Hydrofera Blue under compression he has home health.. The wound looks a lot better today with improvement in surface area 11/16; left anterior leg wound in the setting of chronic venous insufficiency. Wound was initially trauma. We have been using Hydrofera Blue under compression. The patient is closed today. He is supposed to follow-up with vein and vascular with regards to arterial insufficiency nevertheless his leg wounds are 12/3; apparently 2 weeks ago when they were putting on their stockings they managed to get 3 wounds on the left anterior lower leg from abrasion when putting on the stockings. Home health came by the week of Thanksgiving and put Hydrofera Blue 4-layer wrap on  this and there is only one superficial area remaining. The patient and his wife complained about the difficulties getting stockings on I think we are using 20/30. We will order bilateral external compression stockings which should be easier. 12/10; wound on the left anterior lower leg is closed. He has chronic venous insufficiency we ordered him Farrow wrap stockings unfortunately he did not bring these in. 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 He says that it has been present since mid December. He is currently residing in [facility] until March and a podiatrist has been looking after him there. They have been simply painting the area with Betadine. He has not had any lower extremity arterial studies since 2019, at which time his right ABI was 0.69. Measured in clinic today, it was 0.71. He is not aware of his most recent hemoglobin A1c, but historically he has had exceptionally poor control. On the basis of his right first metatarsal, there is a small crescent shaped wound. There is surrounding eschar and callus. There is no malodor or purulent drainage. 09/08/2022: The original wound is smaller today and fairly clean, but there is some discoloration and a pulpy texture to the adjacent callus. Underneath this, the tissue is open exposing the fat layer. It looks like perhaps there was a crack in the callus and moisture got under the skin and caused breakdown. 09/15/2022: There has been more moisture related tissue breakdown. The callus is very soft and the underlying tissue is more open. 09/28/2022: The wound looks much better. He has done a good job keeping it dry. There is some callus overlying much of the wound surface. There is slough on the exposed open areas. Bottger, Jibreel A ([id number]) [id number] Page 3 of 11 10/14/2022: There has been a lot of moisture related tissue breakdown. He is still  forming callus over the top and then  it seems that moisture gets underneath the callus and causes tissue damage. There is slough on the exposed wound surface. They will be moving back to the local area from the [place] this weekend. 10/21/2022: His foot is less wet, but there is no significant change to his wound. He has developed a blister on his left anterior tibial surface. There is no open wound here, but he does have some fairly significant edema. We are planning to apply a total contact cast today. 10/23/2022: Here for his obligatory first cast change. He says he has not had any issues wearing the cast or walking in the boot. No detrimental effects on his wound. 10/29/2022: The wound is looking better. Clearly, putting him in the cast has prevented water from getting in under his callus and causing further tissue breakdown. The blister on his left anterior tibial surface has not yet ruptured. Edema control is significantly improved. 11/05/2022: The wound on his right plantar foot is getting better. There is still some callus accumulation around the wounds, but there are just 2 open areas now, a very small 1 on the medial aspect of his metatarsal head and a little bit larger 1 on the plantar surface. The blister on his left anterior tibial surface is now open with hanging dry skin. 11/12/2022: The left anterior tibial wound is closed. The wound on his right plantar foot is smaller with just a little bit of callus around the edges. 11/19/2022: The right plantar foot wound continues to contract. The surface is quite clean. The tiny satellite area on the medial aspect of his foot has closed. 11/26/2022: The wound is smaller again today. He is responding well to the offloading effects of total contact casting. 12/03/2022: The wound is about the same size today. There is a little bit of moisture around the perimeter of the wound. No significant tissue breakdown. 12/10/2022: The wound is smaller today. There has been  no moisture-related tissue breakdown of any maceration. 12/21/2022: The wound continues to contract. Moisture control is excellent. 12/28/2022: The wound is a little bit smaller today. 01/05/2023: The wound measures smaller today. Excellent moisture control with zero evidence of maceration. The wound surface is clean with healthy granulation tissue. 01/13/2023: We are leaving his Apligraf in situ for a second week. There is no discolored drainage or malodor coming from the site. 01/19/2023: The wound is smaller today. The periwound skin is in good condition without tissue maceration and the healed areas of tissue appear to have improved integrity. 01/27/2023: Continued contraction of the wound. No tissue maceration. The wound surface is flush with the surrounding skin. 02/02/2023: The wound measured a little bit smaller today. There is some bruising just adjacent to the wound, but this has not resulted in any tissue breakdown. 02/09/2023: The wound is smaller again today. The area of bruising that was seen last week has resolved and there has been no tissue damage as a result. There is some callus accumulation around the wound edges along with minimal soft slough on the surface. Electronic Signature(s) Signed: 02/09/2023 9:51:13 AM By: Duanne Guess MD FACS Entered By: Duanne Guess on 02/09/2023 09:51:13 -------------------------------------------------------------------------------- Physical Exam Details Patient Name: Date of Service: [name] Pian, [name] 02/09/2023 9:15 A M Medical Record Number: [id number] Patient Account Number: [id number] Date of Birth/Sex: Treating RN: December 30, 1955 (67 y.o. M) Primary Care Provider: Arva Chafe Other Clinician: Referring Provider: Treating Provider/Extender: [name] in Treatment: 23 Constitutional Hypertensive, asymptomatic. . . . no acute distress.  Respiratory Normal work of breathing on room air. Notes 02/09/2023: The  wound is smaller again today. The area of bruising that was seen last week has resolved and there has been no tissue damage as a result. There is some callus accumulation around the wound edges along with minimal soft slough on the surface. Electronic Signature(s) Signed: 02/09/2023 9:51:43 AM By: Duanne Guess MD FACS Entered By: Duanne Guess on 02/09/2023 09:51:43 Point, Myran A ([id number]) [id number] Page 4 of 11 -------------------------------------------------------------------------------- Physician Orders Details Patient Name: Date of Service: SERENA, [name] 02/09/2023 9:15 A M Medical Record Number: [id number] Patient Account Number: [id number] Date of Birth/Sex: Treating RN: 21-Oct-1955 (67 y.o. [name] Primary Care Provider: Arva Chafe Other Clinician: Referring Provider: Treating Provider/Extender: [name] in Treatment: 23 Verbal / Phone Orders: No Diagnosis Coding ICD-10 Coding Code Description [phone] Non-pressure chronic ulcer of other part of right foot with fat layer exposed I73.9 Peripheral vascular disease, unspecified I25.10 Atherosclerotic heart disease of native coronary artery without angina pectoris I10 Essential (primary) hypertension E11.65 Type 2 diabetes mellitus with hyperglycemia E11.40 Type 2 diabetes mellitus with diabetic neuropathy, unspecified I63.9 Cerebral infarction, unspecified Follow-up Appointments ppointment in 1 week. - Dr. Lady Gary - room 2 Return A Anesthetic (In clinic) Topical Lidocaine 4% applied to wound bed Cellular or Tissue Based Products Wound #2 Right,Plantar Foot Cellular or Tissue Based Product Type: - Epicord #4 Cellular or Tissue Based Product applied to wound bed, secured with steri-strips, cover with Adaptic or Mepitel. (DO NOT REMOVE). Bathing/ Shower/ Hygiene May shower with protection but do not get wound dressing(s) wet. Protect  dressing(s) with water repellant cover (for example, large plastic bag) or a cast cover and may then take shower. Edema Control - Lymphedema / SCD / Other Elevate legs to the level of the heart or above for 30 minutes daily and/or when sitting for 3-4 times a day throughout the day. Avoid standing for long periods of time. Patient to wear own compression stockings every day. Moisturize legs daily. Off-Loading Removable cast walker boot to: - right leg Wound Treatment Wound #2 - Foot Wound Laterality: Plantar, Right Cleanser: Soap and Water 1 x Per Week/30 Days Discharge Instructions: May shower and wash wound with dial antibacterial soap and water prior to dressing change. Cleanser: Wound Cleanser 1 x Per Week/30 Days Discharge Instructions: Cleanse the wound with wound cleanser prior to applying a clean dressing using gauze sponges, not tissue or cotton balls. Peri-Wound Care: Zinc Oxide Ointment 30g tube 1 x Per Week/30 Days Discharge Instructions: Apply Zinc Oxide to periwound with each dressing change Prim Dressing: ADAPTIC TOUCH 3x4.25 (in/in) 1 x Per Week/30 Days ary Discharge Instructions: Apply to wound bed as instructed Prim Dressing: EPICORD 1 x Per Week/30 Days ary Discharge Instructions: #3 (02/02/23) Prim Dressing: TCC ary 1 x Per Week/30 Days Secondary Dressing: Woven Gauze Sponge, Non-Sterile 4x4 in 1 x Per Week/30 Days Discharge Instructions: Apply over primary dressing as directed. Egnew, Clearnce A ([id number]) [id number] Page 5 of 11 Secondary Dressing: Zetuvit Plus 4x8 in 1 x Per Week/30 Days Discharge Instructions: Apply over primary dressing as directed. Secured With: [employer], 4.5x3.1 (in/yd) 1 x Per Week/30 Days Discharge Instructions: Secure with Kerlix as directed. Patient Medications llergies: No Known Drug Allergies A Notifications Medication Indication Start End 02/09/2023 lidocaine DOSE topical 4 % cream - cream  topical Electronic Signature(s) Signed: 02/09/2023 11:06:56 AM By: Duanne Guess MD FACS Entered By: Duanne Guess on 02/09/2023 09:52:16 --------------------------------------------------------------------------------  Problem List Details Patient Name: Date of Service: JEHAN, BONANO A. 02/09/2023 9:15 A M Medical Record Number: [id number] Patient Account Number: [id number] Date of Birth/Sex: Treating RN: [birth year] (67 y.o. M) Primary Care Provider: Arva Chafe Other Clinician: Referring Provider: Treating Provider/Extender: [name] in Treatment: 23 Active Problems ICD-10 Encounter Code Description Active Date MDM Diagnosis L97.512 Non-pressure chronic ulcer of other part of right foot with fat layer exposed 08/31/2022 No Yes I73.9 Peripheral vascular disease, unspecified 08/31/2022 No Yes I25.10 Atherosclerotic heart disease of native coronary artery without angina pectoris 08/31/2022 No Yes I10 Essential (primary) hypertension 08/31/2022 No Yes E11.65 Type 2 diabetes mellitus with hyperglycemia 08/31/2022 No Yes E11.40 Type 2 diabetes mellitus with diabetic neuropathy, unspecified 08/31/2022 No Yes I63.9 Cerebral infarction, unspecified 08/31/2022 No Yes Inactive Problems Strassner, Myka A ([id number]) [phone].pdf Page 6 of 11 Resolved Problems ICD-10 Code Description Active Date Resolved Date L97.821 Non-pressure chronic ulcer of other part of left lower leg limited to breakdown of skin 11/05/2022 11/05/2022 Electronic Signature(s) Signed: 02/09/2023 9:49:47 AM By: Duanne Guess MD FACS Entered By: Duanne Guess on 02/09/2023 09:49:46 -------------------------------------------------------------------------------- Progress Note Details Patient Name: Date of Service: [name] Pian, [name] 02/09/2023 9:15 A M Medical Record Number: [id number] Patient Account Number: [id number] Date of Birth/Sex: Treating RN: August 28, 1955 (67  y.o. M) Primary Care Provider: Arva Chafe Other Clinician: Referring Provider: Treating Provider/Extender: [name] in Treatment: 23 Subjective Chief Complaint Information obtained from Patient 05/14/2020; patient is here for review of an abrasion injury on the left lateral calf 08/31/2022: [name] (1st metatarsal base, plantar) History of Present Illness (HPI) ADMISSION 05/14/2020; this is a 67 year old man with multiple medical issues. Predominantly he has type 2 diabetes with a history of peripheral neuropathy and also history of fairly significant PAD. He had a left superficial femoral to posterior tibial artery bypass in February 2017 he also had an atherectomy and angioplasty by Dr. [name] of the right popliteal artery in 2016. He is supposed to be getting arterial studies annually however this was interrupted last year because of the pandemic. He tells [place] he was at [facility] 2 weeks ago was getting out of of the scooter and traumatized his left lateral lower leg. There was a lot of bleeding as the patient is on Plavix and Eliquis. They have been dressing this with Neosporin and doing a fairly good job. Wound measures 2.5 x 3.5 it does not have any depth he does not have a wound history in his legs outside of surgery however he does have chronic edema and skin changes suggestive of chronic venous disease possibly some degree of lymphedema as well. Past medical history includes type 2 diabetes with peripheral neuropathy and gait instability, lumbar spondylosis, obstructive sleep apnea, history of a left pontine CVA, basal cell skin cancer, atrial fibrillation on anticoagulation, significant PAD as noted with a left superficial femoral to posterior tibial bypass in February 2017 and a right popliteal atherectomy and angioplasty by Dr. [name] in 30th 2016. He also has a history of coronary artery disease with an MI in 2002 hypertension  hyperlipidemia and heart failure with preserved ejection fraction His last arterial studies I can see in epic were on 03/10/2018 this showed a right ABI of 0.69 and a right TBI of 0.5 with monophasic waveforms on the right. On the left his ABI was 1.20 with a TBI of 0.92 and triphasic waveforms. He has not had arterial studies since. Our nurse in the  clinic got an ABI on the left of 1.1 11/2; left anterior leg wound in the setting of chronic venous insufficiency. Wound was initially trauma. We have been using Hydrofera Blue under compression he has home health.. The wound looks a lot better today with improvement in surface area 11/16; left anterior leg wound in the setting of chronic venous insufficiency. Wound was initially trauma. We have been using Hydrofera Blue under compression. The patient is closed today. He is supposed to follow-up with vein and vascular with regards to arterial insufficiency nevertheless his leg wounds are 12/3; apparently 2 weeks ago when they were putting on their stockings they managed to get 3 wounds on the left anterior lower leg from abrasion when putting on the stockings. Home health came by the week of Thanksgiving and put Hydrofera Blue 4-layer wrap on this and there is only one superficial area remaining. The patient and his wife complained about the difficulties getting stockings on I think we are using 20/30. We will order bilateral external compression stockings which should be easier. 12/10; wound on the left anterior lower leg is closed. He has chronic venous insufficiency we ordered him Farrow wrap stockings unfortunately he did not bring these in. 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 He says that it has been present since mid December. He is currently residing in [facility] until March and a podiatrist has been looking after him there. They have been simply painting the area with  Betadine. He has not had any lower extremity arterial studies since 2019, at which time his right ABI was 0.69. Measured in clinic today, it was 0.71. He is not aware of his most recent hemoglobin A1c, but historically he has had exceptionally poor control. On the basis of his right first metatarsal, there is a small crescent shaped wound. There is surrounding eschar and callus. There is no malodor or purulent drainage. 09/08/2022: The original wound is smaller today and fairly clean, but there is some discoloration and a pulpy texture to the adjacent callus. Underneath this, the tissue is open exposing the fat layer. It looks like perhaps there was a crack in the callus and moisture got under the skin and caused breakdown. 09/15/2022: There has been more moisture related tissue breakdown. The callus is very soft and the underlying tissue is more open. Paolillo, Ilyas A ([id number]) [id number] Page 7 of 11 09/28/2022: The wound looks much better. He has done a good job keeping it dry. There is some callus overlying much of the wound surface. There is slough on the exposed open areas. 10/14/2022: There has been a lot of moisture related tissue breakdown. He is still forming callus over the top and then it seems that moisture gets underneath the callus and causes tissue damage. There is slough on the exposed wound surface. They will be moving back to the local area from the beach this weekend. 10/21/2022: His foot is less wet, but there is no significant change to his wound. He has developed a blister on his left anterior tibial surface. There is no open wound here, but he does have some fairly significant edema. We are planning to apply a total contact cast today. 10/23/2022: Here for his obligatory first cast change. He says he has not had any issues wearing the cast or walking in the boot. No detrimental effects on his wound. 10/29/2022: The wound is looking better. Clearly, putting  him in  the cast has prevented water from getting in under his callus and causing further tissue breakdown. The blister on his left anterior tibial surface has not yet ruptured. Edema control is significantly improved. 11/05/2022: The wound on his right plantar foot is getting better. There is still some callus accumulation around the wounds, but there are just 2 open areas now, a very small 1 on the medial aspect of his metatarsal head and a little bit larger 1 on the plantar surface. The blister on his left anterior tibial surface is now open with hanging dry skin. 11/12/2022: The left anterior tibial wound is closed. The wound on his right plantar foot is smaller with just a little bit of callus around the edges. 11/19/2022: The right plantar foot wound continues to contract. The surface is quite clean. The tiny satellite area on the medial aspect of his foot has closed. 11/26/2022: The wound is smaller again today. He is responding well to the offloading effects of total contact casting. 12/03/2022: The wound is about the same size today. There is a little bit of moisture around the perimeter of the wound. No significant tissue breakdown. 12/10/2022: The wound is smaller today. There has been no moisture-related tissue breakdown of any maceration. 12/21/2022: The wound continues to contract. Moisture control is excellent. 12/28/2022: The wound is a little bit smaller today. 01/05/2023: The wound measures smaller today. Excellent moisture control with zero evidence of maceration. The wound surface is clean with healthy granulation tissue. 01/13/2023: We are leaving his Apligraf in situ for a second week. There is no discolored drainage or malodor coming from the site. 01/19/2023: The wound is smaller today. The periwound skin is in good condition without tissue maceration and the healed areas of tissue appear to have improved integrity. 01/27/2023: Continued contraction of the wound. No tissue maceration. The  wound surface is flush with the surrounding skin. 02/02/2023: The wound measured a little bit smaller today. There is some bruising just adjacent to the wound, but this has not resulted in any tissue breakdown. 02/09/2023: The wound is smaller again today. The area of bruising that was seen last week has resolved and there has been no tissue damage as a result. There is some callus accumulation around the wound edges along with minimal soft slough on the surface. Patient History Information obtained from Patient. Family History Unknown History. Social History Never smoker, Marital Status - Single, Alcohol Use - Never, Drug Use - No History, Caffeine Use - Rarely. Medical History Respiratory Patient has history of Sleep Apnea Cardiovascular Patient has history of Arrhythmia - A-Fib, Coronary Artery Disease - s/p CABG, Deep Vein Thrombosis, Hypertension, Myocardial Infarction, Peripheral Arterial Disease - s/p Fempop x 2 Endocrine Patient has history of Type II Diabetes Neurologic Patient has history of Neuropathy Hospitalization/Surgery History - colonoscopy. - polypectomy. - peripheral vascular cath. - shoulder arthroscopy. - carpal tunnel release. - coronary artery bypass. - appendectomy. - cardiac cath. - coronary angioplasty. - thrombectomy. - knee arthroplasty. - popliteal artery stent. 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 no acute distress. Vitals Time Taken: 9:22 AM, Height: 70 in, Weight: 260 lbs, BMI: 37.3, Temperature: 98.1 F, Pulse: 64 bpm, Respiratory Rate: 18 breaths/min, Blood Pressure: 193/67 mmHg. Respiratory Normal work of breathing on room air. General Notes: 02/09/2023: The wound is smaller again today. The area of bruising that  was  seen last week has resolved and there has been no tissue damage as a result. There is some callus accumulation around the wound edges along with minimal soft slough on the surface. Integumentary (Hair, Skin) Wound #2 status is Open. Original cause of wound was Gradually Appeared. The date acquired was: 07/08/2022. The wound has been in treatment 23 weeks. The wound is located on the Right,Plantar Foot. The wound measures 0.4cm length x 0.7cm width x 0.1cm depth; 0.22cm^2 area and 0.022cm^3 volume. There is Fat Layer (Subcutaneous Tissue) exposed. There is no tunneling or undermining noted. There is a medium amount of serosanguineous drainage noted. The wound margin is distinct with the outline attached to the wound base. There is large (67-100%) red, pink granulation within the wound bed. There is a small (1- 33%) amount of necrotic tissue within the wound bed including Adherent Slough. The periwound skin appearance had no abnormalities noted for color. The periwound skin appearance exhibited: Callus. The periwound skin appearance did not exhibit: Maceration. Periwound temperature was noted as No Abnormality. Assessment Active Problems ICD-10 Non-pressure chronic ulcer of other part of right foot with fat layer exposed Peripheral vascular disease, unspecified Atherosclerotic heart disease of native coronary artery without angina pectoris Essential (primary) hypertension Type 2 diabetes mellitus with hyperglycemia Type 2 diabetes mellitus with diabetic neuropathy, unspecified Cerebral infarction, unspecified Procedures Wound #2 Pre-procedure diagnosis of Wound #2 is a Diabetic Wound/Ulcer of the Lower Extremity located on the Right,Plantar Foot. A skin graft procedure using a bioengineered skin substitute/cellular or tissue based product was performed by Duanne Guess, MD with the following instrument(s): Forceps and Scissors. Epicord was applied and secured with Steri-Strips. 3 sq cm of  product was utilized and 3 sq cm was wasted due to wound size. Post Application, adaptic was applied. A Time Out was conducted at 09:42, prior to the start of the procedure. The procedure was tolerated well with a pain level of 0 throughout and a pain level of 0 following the procedure. Post procedure Diagnosis Wound #2: Same as Pre-Procedure General Notes: scribed for Dr. Lady Gary by Samuella Bruin, RN. Pre-procedure diagnosis of Wound #2 is a Diabetic Wound/Ulcer of the Lower Extremity located on the Right,Plantar Foot . There was a T Contact Cast otal Procedure by Duanne Guess, MD. Post procedure Diagnosis Wound #2: Same as Pre-Procedure Notes: scribed for Dr. Lady Gary by Samuella Bruin, RN. Plan Follow-up Appointments: Return Appointment in 1 week. - Dr. Lady Gary - room 2 Anesthetic: (In clinic) Topical Lidocaine 4% applied to wound bed Cellular or Tissue Based Products: Wound #2 Right,Plantar Foot: Cellular or Tissue Based Product Type: - Epicord #4 Cellular or Tissue Based Product applied to wound bed, secured with steri-strips, cover with Adaptic or Mepitel. (DO NOT REMOVE). Bathing/ Shower/ Hygiene: May shower with protection but do not get wound dressing(s) wet. Protect dressing(s) with water repellant cover (for example, large plastic bag) or a cast cover and may then take shower. Edema Control - Lymphedema / SCD / Other: Elevate legs to the level of the heart or above for 30 minutes daily and/or when sitting for 3-4 times a day throughout the day. Avoid standing for long periods of time. Patient to wear own compression stockings every day. Moisturize legs daily. Off-Loading: Removable cast walker boot to: - right leg The following medication(s) was prescribed: lidocaine topical 4 % cream cream topical was prescribed at facility HENRYK, URSIN A ([id number]) [id number].pdf Page 9 of 11 WOUND #2: - Foot Wound Laterality: Plantar, Right Cleanser:  Soap  and Water 1 x Per Week/30 Days Discharge Instructions: May shower and wash wound with dial antibacterial soap and water prior to dressing change. Cleanser: Wound Cleanser 1 x Per Week/30 Days Discharge Instructions: Cleanse the wound with wound cleanser prior to applying a clean dressing using gauze sponges, not tissue or cotton balls. Peri-Wound Care: Zinc Oxide Ointment 30g tube 1 x Per Week/30 Days Discharge Instructions: Apply Zinc Oxide to periwound with each dressing change Prim Dressing: ADAPTIC TOUCH 3x4.25 (in/in) 1 x Per Week/30 Days ary Discharge Instructions: Apply to wound bed as instructed Prim Dressing: EPICORD 1 x Per Week/30 Days ary Discharge Instructions: #3 (02/02/23) Prim Dressing: TCC 1 x Per Week/30 Days ary Secondary Dressing: Woven Gauze Sponge, Non-Sterile 4x4 in 1 x Per Week/30 Days Discharge Instructions: Apply over primary dressing as directed. Secondary Dressing: Zetuvit Plus 4x8 in 1 x Per Week/30 Days Discharge Instructions: Apply over primary dressing as directed. Secured With: [employer], 4.5x3.1 (in/yd) 1 x Per Week/30 Days Discharge Instructions: Secure with Kerlix as directed. 02/09/2023: The wound is smaller again today. The area of bruising that was seen last week has resolved and there has been no tissue damage as a result. There is some callus accumulation around the wound edges along with minimal soft slough on the surface. The wound bed was prepared for Epicord application. The slough wiped off without any need for sharp debridement. Epicord was hydrated with saline and applied in standard fashion. Zinc oxide was applied to the periwound and everything was secured in situ with Adaptic and Steri-Strips. A total contact cast was then applied in standard fashion. He will follow-up in 1 week. Electronic Signature(s) Signed: 02/09/2023 9:53:15 AM By: Duanne Guess MD FACS Entered By: Duanne Guess on 02/09/2023  09:53:15 -------------------------------------------------------------------------------- HxROS Details Patient Name: Date of Service: [name] Pian, [name] 02/09/2023 9:15 A M Medical Record Number: [id number] Patient Account Number: [id number] Date of Birth/Sex: Treating RN: [birth year] (67 y.o. M) Primary Care Provider: Arva Chafe Other Clinician: Referring Provider: Treating Provider/Extender: [name] in Treatment: 23 Information Obtained From Patient Respiratory Medical History: Positive for: Sleep Apnea Cardiovascular Medical History: Positive for: Arrhythmia - A-Fib; Coronary Artery Disease - s/p CABG; Deep Vein Thrombosis; Hypertension; Myocardial Infarction; Peripheral Arterial Disease - s/p Fempop x 2 Past Medical History Notes: CVA x 3 Endocrine Medical History: Positive for: Type II Diabetes Time with diabetes: since 1997 Treated with: Insulin, Oral agents Blood sugar tested every day: Yes Tested : 2-3x per day Musculoskeletal Medical History: Past Medical History Notes: carpal tunnel syndrome Diosdado, Vinicius A ([id number]) [phone].pdf Page 10 of 11 Neurologic Medical History: Positive for: Neuropathy Past Medical History Notes: stroke Oncologic Medical History: Past Medical History Notes: skin cancer Immunizations Pneumococcal Vaccine: Received Pneumococcal Vaccination: Yes Received Pneumococcal Vaccination On or After 60th Birthday: Yes Implantable Devices None Hospitalization / Surgery History Type of Hospitalization/Surgery colonoscopy polypectomy peripheral vascular cath shoulder arthroscopy carpal tunnel release coronary artery bypass appendectomy cardiac cath coronary angioplasty thrombectomy knee arthroplasty popliteal artery stent Family and Social History Unknown History: Yes; Never smoker; Marital Status - Single; Alcohol Use: Never; Drug Use: No History; Caffeine Use: Rarely;  Financial Concerns: No; Food, Clothing or Shelter Needs: No; Support System Lacking: No; Transportation Concerns: No Electronic Signature(s) Signed: 02/09/2023 11:06:56 AM By: Duanne Guess MD FACS Entered By: Duanne Guess on 02/09/2023 09:51:20 -------------------------------------------------------------------------------- Total Contact Cast Details Patient Name: Date of Service: [name] Pian, [name] 02/09/2023 9:15 A M Medical Record Number:  [id number] Patient Account Number: [id number] Date of Birth/Sex: Treating RN: 26-Oct-1955 (67 y.o. [name] Primary Care Provider: Arva Chafe Other Clinician: Referring Provider: Treating Provider/Extender: [name] in Treatment: 23 T Contact Cast Applied for Wound Assessment: otal Wound #2 Right,Plantar Foot Performed By: Physician Duanne Guess, MD Post Procedure Diagnosis Same as Pre-procedure Notes scribed for Dr. Lady Gary by Samuella Bruin, RN Electronic Signature(s) Signed: 02/09/2023 11:06:56 AM By: Duanne Guess MD FACS Signed: 02/09/2023 3:57:44 PM By: Karleen Dolphin, Nivan A ([id number]) [id number] Page 11 of 11 Signed: 02/09/2023 3:57:44 PM By: Gelene Mink By: Samuella Bruin on 02/09/2023 09:38:46 -------------------------------------------------------------------------------- SuperBill Details Patient Name: Date of Service: [name] RK A. 02/09/2023 Medical Record Number: [id number] Patient Account Number: [id number] Date of Birth/Sex: Treating RN: Jan 17, 1956 (67 y.o. M) Primary Care Provider: Arva Chafe Other Clinician: Referring Provider: Treating Provider/Extender: [name] in Treatment: 23 Diagnosis Coding ICD-10 Codes Code Description [phone] Non-pressure chronic ulcer of other part of right foot with fat layer exposed I73.9 Peripheral vascular disease,  unspecified I25.10 Atherosclerotic heart disease of native coronary artery without angina pectoris I10 Essential (primary) hypertension E11.65 Type 2 diabetes mellitus with hyperglycemia E11.40 Type 2 diabetes mellitus with diabetic neuropathy, unspecified I63.9 Cerebral infarction, unspecified Facility Procedures : CPT4 Code: 41324401 Description: Q4187 Epicord 2cm x 3cm - per sqcm Modifier: Quantity: 6 : CPT4 Code: [id number] Description: 15275 - SKIN SUB GRAFT FACE/NK/HF/G ICD-10 Diagnosis Description L97.512 Non-pressure chronic ulcer of other part of right foot with fat layer exposed Modifier: Quantity: 1 : CPT4 Code: [id number] Description: 59563 - APPLY TOTAL CONTACT LEG CAST ICD-10 Diagnosis Description L97.512 Non-pressure chronic ulcer of other part of right foot with fat layer exposed Modifier: Quantity: 1 Physician Procedures : CPT4 Code Description Modifier [id number] - WC PHYS LEVEL 3 - EST PT 25 ICD-10 Diagnosis Description L97.512 Non-pressure chronic ulcer of other part of right foot with fat layer exposed E11.40 Type 2 diabetes mellitus with diabetic neuropathy,  unspecified I73.9 Peripheral vascular disease, unspecified E11.65 Type 2 diabetes mellitus with hyperglycemia Quantity: 1 : [id number] 15275 - WC PHYS SKIN SUB GRAFT FACE/NK/HF/G ICD-10 Diagnosis Description L97.512 Non-pressure chronic ulcer of other part of right foot with fat layer exposed Quantity: 1 : [id number] 29445 - WC PHYS APPLY TOTAL CONTACT CAST ICD-10 Diagnosis Description L97.512 Non-pressure chronic ulcer of other part of right foot with fat layer exposed Quantity: 1 Electronic Signature(s) Signed: 02/09/2023 9:53:48 AM By: Duanne Guess MD FACS Entered By: Duanne Guess on 02/09/2023 09:53:47

## 2023-02-16 ENCOUNTER — Encounter (HOSPITAL_BASED_OUTPATIENT_CLINIC_OR_DEPARTMENT_OTHER): Payer: Medicare HMO | Admitting: General Surgery

## 2023-02-16 ENCOUNTER — Ambulatory Visit: Payer: Medicare HMO | Attending: Cardiology

## 2023-02-16 DIAGNOSIS — E11621 Type 2 diabetes mellitus with foot ulcer: Secondary | ICD-10-CM | POA: Diagnosis not present

## 2023-02-16 DIAGNOSIS — Z01810 Encounter for preprocedural cardiovascular examination: Secondary | ICD-10-CM

## 2023-02-16 DIAGNOSIS — G4733 Obstructive sleep apnea (adult) (pediatric): Secondary | ICD-10-CM | POA: Diagnosis not present

## 2023-02-16 DIAGNOSIS — Z85828 Personal history of other malignant neoplasm of skin: Secondary | ICD-10-CM | POA: Diagnosis not present

## 2023-02-16 DIAGNOSIS — I110 Hypertensive heart disease with heart failure: Secondary | ICD-10-CM | POA: Diagnosis not present

## 2023-02-16 DIAGNOSIS — E1140 Type 2 diabetes mellitus with diabetic neuropathy, unspecified: Secondary | ICD-10-CM | POA: Diagnosis not present

## 2023-02-16 DIAGNOSIS — I5032 Chronic diastolic (congestive) heart failure: Secondary | ICD-10-CM | POA: Diagnosis not present

## 2023-02-16 DIAGNOSIS — E1151 Type 2 diabetes mellitus with diabetic peripheral angiopathy without gangrene: Secondary | ICD-10-CM | POA: Diagnosis not present

## 2023-02-16 DIAGNOSIS — Z7902 Long term (current) use of antithrombotics/antiplatelets: Secondary | ICD-10-CM | POA: Diagnosis not present

## 2023-02-16 DIAGNOSIS — E1165 Type 2 diabetes mellitus with hyperglycemia: Secondary | ICD-10-CM | POA: Diagnosis not present

## 2023-02-16 DIAGNOSIS — L97512 Non-pressure chronic ulcer of other part of right foot with fat layer exposed: Secondary | ICD-10-CM | POA: Diagnosis not present

## 2023-02-16 DIAGNOSIS — Z7901 Long term (current) use of anticoagulants: Secondary | ICD-10-CM | POA: Diagnosis not present

## 2023-02-16 DIAGNOSIS — I2510 Atherosclerotic heart disease of native coronary artery without angina pectoris: Secondary | ICD-10-CM | POA: Diagnosis not present

## 2023-02-16 DIAGNOSIS — I4891 Unspecified atrial fibrillation: Secondary | ICD-10-CM | POA: Diagnosis not present
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
## 2023-02-16 NOTE — Progress Notes (Signed)
Lapage, Dace [name] ([id number]) [id number] Page 1 of 7 Visit Report for 02/16/2023 Arrival Information Details Patient Name: Date of Service: [name], [name]. 02/16/2023 9:15 [name] Medical Record Number: [id number] Patient Account Number: [id number] Date of Birth/Sex: Treating RN: 03-28-1956 (67 y.o. [name] Primary Care [name]: [name] Other Clinician: Referring [name]: Treating [name]/Extender: [name] in Treatment: 24 Visit Information History Since Last Visit Added or deleted any medications: No Patient Arrived: Ambulatory Any new allergies or adverse reactions: No Arrival Time: 09:01 Had [name] fall or experienced change in No Accompanied By: partner activities of daily living that may affect Transfer Assistance: None risk of falls: Patient Identification Verified: Yes Signs or symptoms of abuse/neglect since last visito No Secondary Verification Process Completed: Yes Hospitalized since last visit: No Patient Requires Transmission-Based Precautions: No Implantable device outside of the clinic excluding No Patient Has Alerts: No cellular tissue based products placed in the center since last visit: Has Dressing in Place as Prescribed: Yes Has Footwear/Offloading in Place as Prescribed: Yes Right: T Contact Cast otal Pain Present Now: Yes Electronic Signature(s) Signed: 02/16/2023 4:02:14 PM By: Samuella Bruin Entered By: Samuella Bruin on 02/16/2023 09:02:28 -------------------------------------------------------------------------------- Encounter Discharge Information Details Patient Name: Date of Service: [name], [name]. 02/16/2023 9:15 [name] Medical Record Number: [id number] Patient Account Number: [id number] Date of Birth/Sex: Treating RN: 11-09-1955 (67 y.o. [name] Primary Care [name]: [name] Other Clinician: Referring Eldridge Marcott: Treating [name]/Extender:  [name] in Treatment: 24 Encounter Discharge Information Items Post Procedure Vitals Discharge Condition: Stable Temperature (F): 98.4 Ambulatory Status: Ambulatory Pulse (bpm): 51 Discharge Destination: Home Respiratory Rate (breaths/min): 20 Transportation: Private Auto Blood Pressure (mmHg): 190/70 Accompanied By: partner Schedule Follow-up Appointment: Yes Clinical Summary of Care: Patient Declined Electronic Signature(s) Signed: 02/16/2023 4:02:14 PM By: Samuella Bruin Entered By: Samuella Bruin on 02/16/2023 09:55:04 Jaeger, [name] ([id number]) [id number] Page 2 of 7 -------------------------------------------------------------------------------- Lower Extremity Assessment Details Patient Name: Date of Service: [name], [name]. 02/16/2023 9:15 [name] Medical Record Number: [id number] Patient Account Number: [id number] Date of Birth/Sex: Treating RN: [birth year] (67 y.o. [name] Primary Care [name]: [name] Other Clinician: Referring Shmuel Girgis: Treating [name]/Extender: [name] in Treatment: 24 Edema Assessment Assessed: [name]: No] [name]: No] Edema: [Left: Ye] [Right: s] Calf Left: Right: Point of Measurement: From Medial Instep 37.5 cm Ankle Left: Right: Point of Measurement: From Medial Instep 25.5 cm Vascular Assessment Pulses: Dorsalis Pedis Palpable: [Right:Yes] Extremity colors, hair growth, and conditions: Extremity Color: [Right:Normal] Hair Growth on Extremity: [Right:Yes Warm] Electronic Signature(s) Signed: 02/16/2023 4:02:14 PM By: Samuella Bruin Entered By: Samuella Bruin on 02/16/2023 09:12:13 -------------------------------------------------------------------------------- Multi Wound Chart Details Patient Name: Date of Service: [name], [name]. 02/16/2023 9:15 [name] Medical Record Number: [id number] Patient Account Number:  [id number] Date of Birth/Sex: Treating RN: 07-Feb-1956 (67 y.o. M) Primary Care [name]: [name] Other Clinician: Referring [name]: Treating [name]/Extender: [name] in Treatment: 24 Vital Signs Height(in): 70 Pulse(bpm): 51 Weight(lbs): 260 Blood Pressure(mmHg): 207/68 Body Mass Index(BMI): 37.3 Temperature(F): 98.4 Respiratory Rate(breaths/min): 20 [2:Photos:] [[name]:[name]] Right, Plantar Foot [name] [name] Wound Location: Gradually Appeared [name] [name] Wounding Event: Diabetic Wound/Ulcer of the Lower [name] [name] Primary Etiology: Extremity Sleep Apnea, Arrhythmia, Coronary [name] [name] Comorbid History: Artery Disease, Deep Vein Thrombosis, Hypertension, Myocardial Infarction, Peripheral Arterial Disease, Type II Diabetes, Neuropathy 07/08/2022 [name] [name] Date Acquired: 24 [name] [name] Weeks of Treatment: Open [name] [name] Wound  Status: No [name] [name] Wound Recurrence: 0.8x0.8x0.1 [name] [name] Measurements L x W x D (cm) 0.503 [name] [name] [name] : rea 0.05 [name] [name] Volume (cm) : 80.30% [name] [name] % Reduction in [name] rea: 80.40% [name] [name] % Reduction in Volume: Grade 1 [name] [name] Classification: Medium [name] [name] Exudate [name] mount: Serosanguineous [name] [name] Exudate Type: red, brown [name] [name] Exudate Color: Distinct, outline attached [name] [name] Wound Margin: Large (67-100%) [name] [name] Granulation [name] mount: Red, Pink [name] [name] Granulation Quality: Small (1-33%) [name] [name] Necrotic [name] mount: Fat Layer (Subcutaneous Tissue): Yes [name] [name] Exposed Structures: Fascia: No Tendon: No Muscle: No Joint: No Bone: No Medium (34-66%) [name] [name] Epithelialization: Callus: Yes [name] [name] Periwound Skin Texture: Maceration: No [name] [name] Periwound Skin Moisture: No Abnormalities Noted [name] [name] Periwound Skin Color: No Abnormality [name] [name] Temperature: Cellular or Tissue Based Product [name] [name] Procedures Performed: T Contact Cast otal Treatment Notes Electronic Signature(s) Signed: 02/16/2023  9:43:34 AM By: Duanne Guess MD FACS Entered By: Duanne Guess on 02/16/2023 09:43:34 -------------------------------------------------------------------------------- Multi-Disciplinary Care Plan Details Patient Name: Date of Service: [name] Sabal [name]. 02/16/2023 9:15 [name] Medical Record Number: [id number] Patient Account Number: [id number] Date of Birth/Sex: Treating RN: [birth year] (67 y.o. [name] Primary Care Gunter Conde: [name] Other Clinician: Referring Wallie Lagrand: Treating Ayen Viviano/Extender: [name] in Treatment: 24 Multidisciplinary Care Plan reviewed with physician Active Inactive Abuse / Safety / Falls / Self Care Management Nursing Diagnoses: Impaired physical mobility Potential for falls Goals: Patient will not experience any injury related to falls Date Initiated: 08/31/2022 Target Resolution Date: 02/19/2023 [name], [name] ([id number]) [phone].pdf Page 4 of 7 Goal Status: Active Patient/caregiver will verbalize/demonstrate measures taken to improve the patient's personal safety Date Initiated: 08/31/2022 Target Resolution Date: 02/19/2023 Goal Status: Active Interventions: Provide education on basic hygiene Provide education on fall prevention Notes: Electronic Signature(s) Signed: 02/16/2023 4:02:14 PM By: Samuella Bruin Entered By: Samuella Bruin on 02/16/2023 09:16:40 -------------------------------------------------------------------------------- Pain Assessment Details Patient Name: Date of Service: [name] Sabal [name]. 02/16/2023 9:15 [name] Medical Record Number: [id number] Patient Account Number: [id number] Date of Birth/Sex: Treating RN: 10-01-1955 (67 y.o. [name] Primary Care Audy Dauphine: [name] Other Clinician: Referring Latricia Cerrito: Treating Kandise Riehle/Extender: [name] in Treatment: 24 Active Problems Location of Pain  Severity and Description of Pain Patient Has Paino Yes Site Locations Pain Location: Pain in Ulcers Duration of the Pain. Constant / Intermittento Intermittent Rate the pain. Current Pain Level: 6 Pain Management and Medication Current Pain Management: Medication: Yes Electronic Signature(s) Signed: 02/16/2023 4:02:14 PM By: Samuella Bruin Entered By: Samuella Bruin on 02/16/2023 09:03:41 -------------------------------------------------------------------------------- Patient/Caregiver Education Details Patient Name: Date of Service: [name], [name]. 7/30/2024andnbsp9:15 [name] Renee Harder, Nekhi [name] ([id number]) [id number] Page 5 of 7 Medical Record Number: [id number] Patient Account Number: [id number] Date of Birth/Gender: Treating RN: Oct 28, 1955 (67 y.o. [name] Primary Care Physician: [name] Other Clinician: Referring Physician: Treating Physician/Extender: [name] in Treatment: 24 Education Assessment Education Provided To: Patient Education Topics Provided Wound/Skin Impairment: Methods: Explain/Verbal Responses: Reinforcements needed, State content correctly [employer]) Signed: 02/16/2023 4:02:14 PM By: Gelene Mink By: Samuella Bruin on 02/16/2023 09:16:52 -------------------------------------------------------------------------------- Wound Assessment Details Patient Name: Date of Service: [name], [name]. 02/16/2023 9:15 [name] Medical Record Number: [id number] Patient Account Number: [id number] Date of Birth/Sex: Treating RN: [birth year] (67 y.o. [name] Primary Care Kiing Deakin: [name] Other Clinician: Referring Nandini Bogdanski: Treating Naisha Wisdom/Extender: Lady Gary  Ruby Cola, Janyth Pupa Weeks in Treatment: 24 Wound Status Wound Number: 2 Primary Diabetic Wound/Ulcer of the Lower Extremity Etiology: Wound Location: Right, Plantar  Foot Wound Open Wounding Event: Gradually Appeared Status: Date Acquired: 07/08/2022 Comorbid Sleep Apnea, Arrhythmia, Coronary Artery Disease, Deep Vein Weeks Of Treatment: 24 History: Thrombosis, Hypertension, Myocardial Infarction, Peripheral Arterial Clustered Wound: No Disease, Type II Diabetes, Neuropathy Photos Wound Measurements Length: (cm) 0.8 Width: (cm) 0.8 Depth: (cm) 0.1 Area: (cm) 0.503 Volume: (cm) 0.05 % Reduction in Area: 80.3% % Reduction in Volume: 80.4% Epithelialization: Medium (34-66%) Tunneling: No Undermining: No Wound Description Classification: Grade 1 Wound Margin: Distinct, outline attached Exudate Amount: Medium Exudate Type: Serosanguineous [name], [name] ([id number]) Exudate Color: red, brown Foul Odor After Cleansing: No Slough/Fibrino Yes [id number] Page 6 of 7 Wound Bed Granulation Amount: Large (67-100%) Exposed Structure Granulation Quality: Red, Pink Fascia Exposed: No Necrotic Amount: Small (1-33%) Fat Layer (Subcutaneous Tissue) Exposed: Yes Necrotic Quality: Adherent Slough Tendon Exposed: No Muscle Exposed: No Joint Exposed: No Bone Exposed: No Periwound Skin Texture Texture Color No Abnormalities Noted: No No Abnormalities Noted: Yes Callus: Yes Temperature / Pain Temperature: No Abnormality Moisture No Abnormalities Noted: No Maceration: No Treatment Notes Wound #2 (Foot) Wound Laterality: Plantar, Right Cleanser Soap and Water Discharge Instruction: May shower and wash wound with dial antibacterial soap and water prior to dressing change. Wound Cleanser Discharge Instruction: Cleanse the wound with wound cleanser prior to applying [name] clean dressing using gauze sponges, not tissue or cotton balls. Peri-Wound Care Zinc Oxide Ointment 30g tube Discharge Instruction: Apply Zinc Oxide to periwound with each dressing change Topical Primary Dressing ADAPTIC TOUCH 3x4.25 (in/in) Discharge  Instruction: Apply to wound bed as instructed EPICORD Discharge Instruction: #3 (02/02/23) TCC Secondary Dressing Woven Gauze Sponge, Non-Sterile 4x4 in Discharge Instruction: Apply over primary dressing as directed. Zetuvit Plus 4x8 in Discharge Instruction: Apply over primary dressing as directed. Secured With [employer], 4.5x3.1 (in/yd) Discharge Instruction: Secure with Kerlix as directed. Compression Wrap Compression Stockings Add-Ons Electronic Signature(s) Signed: 02/16/2023 4:02:14 PM By: Samuella Bruin Entered By: Samuella Bruin on 02/16/2023 09:14:19 -------------------------------------------------------------------------------- Vitals Details Patient Name: Date of Service: [name], [name]. 02/16/2023 9:15 [name] Medical Record Number: [id number] Patient Account Number: [id number] [name], [name] ([id number]) [id number] Page 7 of 7 Date of Birth/Sex: Treating RN: 02-25-1956 (67 y.o. [name] Primary Care Eniyah Eastmond: Other Clinician: Arva [name] Referring Avant Printy: Treating Mosetta Ferdinand/Extender: [name] in Treatment: 24 Vital Signs Time Taken: 09:02 Temperature (F): 98.4 Height (in): 70 Pulse (bpm): 51 Weight (lbs): 260 Respiratory Rate (breaths/min): 20 Body Mass Index (BMI): 37.3 Blood Pressure (mmHg): 207/68 Reference Range: 80 - 120 mg / dl Electronic Signature(s) Signed: 02/16/2023 4:02:14 PM By: Samuella Bruin Entered By: Samuella Bruin on 02/16/2023 09:02:50

## 2023-02-16 NOTE — Progress Notes (Signed)
[name], [name] ([id number]) [id number] Page 1 of 11 Visit Report for 02/16/2023 Chief Complaint Document Details Patient Name: Date of Service: [name], [name] [place] A. 02/16/2023 9:15 A M Medical Record Number: [id number] Patient Account Number: [id number] Date of Birth/Sex: Treating RN: 05-27-1956 (67 y.o. M) Primary Care Provider: Arva Chafe Other Clinician: Referring Provider: Treating Provider/Extender: [name] in Treatment: 24 Information Obtained from: Patient Chief Complaint 05/14/2020; patient is here for review of an abrasion injury on the left lateral calf 08/31/2022: [name] (1st metatarsal base, plantar) Electronic Signature(s) Signed: 02/16/2023 9:43:43 AM By: Duanne Guess MD FACS Entered By: Duanne Guess on 02/16/2023 09:43:43 -------------------------------------------------------------------------------- Cellular or Tissue Based Product Details Patient Name: Date of Service: Nicastro, [name] 02/16/2023 9:15 A M Medical Record Number: [id number] Patient Account Number: [id number] Date of Birth/Sex: Treating RN: May 22, 1956 (67 y.o. Marlan [name] Primary Care Provider: Arva Chafe Other Clinician: Referring Provider: Treating Provider/Extender: [name] in Treatment: 24 Cellular or Tissue Based Product Type Wound #2 Right,Plantar Foot Applied to: Performed By: Physician Duanne Guess, MD Cellular or Tissue Based Product Type: Epicord Level of Consciousness (Pre-procedure): Awake and Alert Pre-procedure Verification/Time Out Yes - 09:21 Taken: Location: genitalia / hands / feet / multiple digits Wound Size (sq cm): 0.64 Product Size (sq cm): 6 Waste Size (sq cm): 0 Waste Reason: wound size Amount of Product Applied (sq cm): 6 Instrument Used: Forceps, Scissors Lot #: [id number] Expiration Date: 07/21/2027 Fenestrated: No Reconstituted:  Yes Solution Type: normal saline Solution Amount: 6 ml Lot #: [id number] KS Solution Expiration Date: 08/10/2024 Secured: Yes Secured With: Steri-Strips Dressing Applied: Yes Primary Dressing: adaptic Procedural Pain: 0 Post Procedural Pain: 0 Littrell, Torsten A ([id number]) [id number] Page 2 of 11 Response to Treatment: Procedure was tolerated well Level of Consciousness (Post- Awake and Alert procedure): Post Procedure Diagnosis Same as Pre-procedure Notes scribed for Dr. Lady Gary by Samuella Bruin, RN Electronic Signature(s) Signed: 02/16/2023 1:00:30 PM By: Duanne Guess MD FACS Signed: 02/16/2023 4:02:14 PM By: Gelene Mink By: Samuella Bruin on 02/16/2023 09:27:23 -------------------------------------------------------------------------------- HPI Details Patient Name: Date of Service: [name] Pian, [name] 02/16/2023 9:15 A M Medical Record Number: [id number] Patient Account Number: [id number] Date of Birth/Sex: Treating RN: Nov 27, 1955 (67 y.o. M) Primary Care Provider: Arva Chafe Other Clinician: Referring Provider: Treating Provider/Extender: [name] in Treatment: 24 History of Present Illness HPI Description: ADMISSION 05/14/2020; this is a 67 year old man with multiple medical issues. Predominantly he has type 2 diabetes with a history of peripheral neuropathy and also history of fairly significant PAD. He had a left superficial femoral to posterior tibial artery bypass in February 2017 he also had an atherectomy and angioplasty by Dr. [name] of the right popliteal artery in 2016. He is supposed to be getting arterial studies annually however this was interrupted last year because of the pandemic. He tells [place] he was at [facility] 2 weeks ago was getting out of of the scooter and traumatized his left lateral lower leg. There was a lot of bleeding as the patient is on Plavix and Eliquis. They have been  dressing this with Neosporin and doing a fairly good job. Wound measures 2.5 x 3.5 it does not have any depth he does not have a wound history in his legs outside of surgery however he does have chronic edema and skin changes suggestive of chronic venous disease possibly some degree of lymphedema as well. Past medical history includes type 2 diabetes with  peripheral neuropathy and gait instability, lumbar spondylosis, obstructive sleep apnea, history of a left pontine CVA, basal cell skin cancer, atrial fibrillation on anticoagulation, significant PAD as noted with a left superficial femoral to posterior tibial bypass in February 2017 and a right popliteal atherectomy and angioplasty by Dr. [name] in 30th 2016. He also has a history of coronary artery disease with an MI in 2002 hypertension hyperlipidemia and heart failure with preserved ejection fraction His last arterial studies I can see in epic were on 03/10/2018 this showed a right ABI of 0.69 and a right TBI of 0.5 with monophasic waveforms on the right. On the left his ABI was 1.20 with a TBI of 0.92 and triphasic waveforms. He has not had arterial studies since. Our nurse in the clinic got an ABI on the left of 1.1 11/2; left anterior leg wound in the setting of chronic venous insufficiency. Wound was initially trauma. We have been using Hydrofera Blue under compression he has home health.. The wound looks a lot better today with improvement in surface area 11/16; left anterior leg wound in the setting of chronic venous insufficiency. Wound was initially trauma. We have been using Hydrofera Blue under compression. The patient is closed today. He is supposed to follow-up with vein and vascular with regards to arterial insufficiency nevertheless his leg wounds are 12/3; apparently 2 weeks ago when they were putting on their stockings they managed to get 3 wounds on the left anterior lower leg from abrasion when putting on the stockings. Home  health came by the week of Thanksgiving and put Hydrofera Blue 4-layer wrap on this and there is only one superficial area remaining. The patient and his wife complained about the difficulties getting stockings on I think we are using 20/30. We will order bilateral external compression stockings which should be easier. 12/10; wound on the left anterior lower leg is closed. He has chronic venous insufficiency we ordered him Farrow wrap stockings unfortunately he did not bring these in. 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 He says that it has been present since mid December. He is currently residing in [facility] until March and a podiatrist has been looking after him there. They have been simply painting the area with Betadine. He has not had any lower extremity arterial studies since 2019, at which time his right ABI was 0.69. Measured in clinic today, it was 0.71. He is not aware of his most recent hemoglobin A1c, but historically he has had exceptionally poor control. On the basis of his right first metatarsal, there is a small crescent shaped wound. There is surrounding eschar and callus. There is no malodor or purulent drainage. 09/08/2022: The original wound is smaller today and fairly clean, but there is some discoloration and a pulpy texture to the adjacent callus. Underneath this, the tissue is open exposing the fat layer. It looks like perhaps there was a crack in the callus and moisture got under the skin and caused breakdown. 09/15/2022: There has been more moisture related tissue breakdown. The callus is very soft and the underlying tissue is more open. 09/28/2022: The wound looks much better. He has done a good job keeping it dry. There is some callus overlying much of the wound surface. There is slough on Woulfe, Jacorion A ([id number]) [id number] Page 3 of 11 the exposed open  areas. 10/14/2022: There has been a lot of moisture related tissue breakdown. He  is still forming callus over the top and then it seems that moisture gets underneath the callus and causes tissue damage. There is slough on the exposed wound surface. They will be moving back to the local area from the beach this weekend. 10/21/2022: His foot is less wet, but there is no significant change to his wound. He has developed a blister on his left anterior tibial surface. There is no open wound here, but he does have some fairly significant edema. We are planning to apply a total contact cast today. 10/23/2022: Here for his obligatory first cast change. He says he has not had any issues wearing the cast or walking in the boot. No detrimental effects on his wound. 10/29/2022: The wound is looking better. Clearly, putting him in the cast has prevented water from getting in under his callus and causing further tissue breakdown. The blister on his left anterior tibial surface has not yet ruptured. Edema control is significantly improved. 11/05/2022: The wound on his right plantar foot is getting better. There is still some callus accumulation around the wounds, but there are just 2 open areas now, a very small 1 on the medial aspect of his metatarsal head and a little bit larger 1 on the plantar surface. The blister on his left anterior tibial surface is now open with hanging dry skin. 11/12/2022: The left anterior tibial wound is closed. The wound on his right plantar foot is smaller with just a little bit of callus around the edges. 11/19/2022: The right plantar foot wound continues to contract. The surface is quite clean. The tiny satellite area on the medial aspect of his foot has closed. 11/26/2022: The wound is smaller again today. He is responding well to the offloading effects of total contact casting. 12/03/2022: The wound is about the same size today. There is a little bit of moisture around the perimeter of the  wound. No significant tissue breakdown. 12/10/2022: The wound is smaller today. There has been no moisture-related tissue breakdown of any maceration. 12/21/2022: The wound continues to contract. Moisture control is excellent. 12/28/2022: The wound is a little bit smaller today. 01/05/2023: The wound measures smaller today. Excellent moisture control with zero evidence of maceration. The wound surface is clean with healthy granulation tissue. 01/13/2023: We are leaving his Apligraf in situ for a second week. There is no discolored drainage or malodor coming from the site. 01/19/2023: The wound is smaller today. The periwound skin is in good condition without tissue maceration and the healed areas of tissue appear to have improved integrity. 01/27/2023: Continued contraction of the wound. No tissue maceration. The wound surface is flush with the surrounding skin. 02/02/2023: The wound measured a little bit smaller today. There is some bruising just adjacent to the wound, but this has not resulted in any tissue breakdown. 02/09/2023: The wound is smaller again today. The area of bruising that was seen last week has resolved and there has been no tissue damage as a result. There is some callus accumulation around the wound edges along with minimal soft slough on the surface. 02/16/2023: The wound measured a little bit larger today. There is a little bit of maceration around the wound edges, which is likely responsible. Electronic Signature(s) Signed: 02/16/2023 9:44:55 AM By: Duanne Guess MD FACS Entered By: Duanne Guess on 02/16/2023 09:44:55 -------------------------------------------------------------------------------- Physical Exam Details Patient Name: Date of Service: [name] Pian, [name] 02/16/2023 9:15 A M Medical Record Number: [id number] Patient Account Number: [id number] Date of Birth/Sex: Treating  RN: [birth year] (67 y.o. M) Primary Care Provider: Arva Chafe Other Clinician: Referring  Provider: Treating Provider/Extender: [name] in Treatment: 24 Constitutional Hypertensive, asymptomatic. Bradycardic, asymptomatic. . . no acute distress. Respiratory Normal work of breathing on room air. Notes 02/16/2023: The wound measured a little bit larger today. There is a little bit of maceration around the wound edges, which is likely responsible. Electronic Signature(s) Signed: 02/16/2023 9:45:26 AM By: Duanne Guess MD FACS Entered By: Duanne Guess on 02/16/2023 09:45:26 Dauria, Jamarrion A ([id number]) [id number] Page 4 of 11 -------------------------------------------------------------------------------- Physician Orders Details Patient Name: Date of Service: [place], [name] 02/16/2023 9:15 A M Medical Record Number: [id number] Patient Account Number: [id number] Date of Birth/Sex: Treating RN: 28-Nov-1955 (67 y.o. Marlan [name] Primary Care Provider: Arva Chafe Other Clinician: Referring Provider: Treating Provider/Extender: [name] in Treatment: 24 Verbal / Phone Orders: No Diagnosis Coding ICD-10 Coding Code Description [phone] Non-pressure chronic ulcer of other part of right foot with fat layer exposed I73.9 Peripheral vascular disease, unspecified I25.10 Atherosclerotic heart disease of native coronary artery without angina pectoris I10 Essential (primary) hypertension E11.65 Type 2 diabetes mellitus with hyperglycemia E11.40 Type 2 diabetes mellitus with diabetic neuropathy, unspecified I63.9 Cerebral infarction, unspecified Follow-up Appointments ppointment in 1 week. - Dr. Lady Gary - room 2 Return A Anesthetic (In clinic) Topical Lidocaine 4% applied to wound bed Cellular or Tissue Based Products Wound #2 Right,Plantar Foot Cellular or Tissue Based Product Type: - Epicord #5 Cellular or Tissue Based Product applied to wound bed, secured with  steri-strips, cover with Adaptic or Mepitel. (DO NOT REMOVE). Bathing/ Shower/ Hygiene May shower with protection but do not get wound dressing(s) wet. Protect dressing(s) with water repellant cover (for example, large plastic bag) or a cast cover and may then take shower. Edema Control - Lymphedema / SCD / Other Elevate legs to the level of the heart or above for 30 minutes daily and/or when sitting for 3-4 times a day throughout the day. Avoid standing for long periods of time. Patient to wear own compression stockings every day. Moisturize legs daily. Off-Loading Removable cast walker boot to: - right leg Wound Treatment Wound #2 - Foot Wound Laterality: Plantar, Right Cleanser: Soap and Water 1 x Per Week/30 Days Discharge Instructions: May shower and wash wound with dial antibacterial soap and water prior to dressing change. Cleanser: Wound Cleanser 1 x Per Week/30 Days Discharge Instructions: Cleanse the wound with wound cleanser prior to applying a clean dressing using gauze sponges, not tissue or cotton balls. Peri-Wound Care: Zinc Oxide Ointment 30g tube 1 x Per Week/30 Days Discharge Instructions: Apply Zinc Oxide to periwound with each dressing change Prim Dressing: ADAPTIC TOUCH 3x4.25 (in/in) 1 x Per Week/30 Days ary Discharge Instructions: Apply to wound bed as instructed Prim Dressing: EPICORD 1 x Per Week/30 Days ary Discharge Instructions: #3 (02/02/23) Prim Dressing: TCC ary 1 x Per Week/30 Days Secondary Dressing: Woven Gauze Sponge, Non-Sterile 4x4 in 1 x Per Week/30 Days Hoes, Devansh A ([id number]) [id number] Page 5 of 11 Discharge Instructions: Apply over primary dressing as directed. Secondary Dressing: Zetuvit Plus 4x8 in 1 x Per Week/30 Days Discharge Instructions: Apply over primary dressing as directed. Secured With: [employer], 4.5x3.1 (in/yd) 1 x Per Week/30 Days Discharge Instructions: Secure with Kerlix as  directed. Patient Medications llergies: No Known Drug Allergies A Notifications Medication Indication Start End 02/16/2023 lidocaine DOSE topical 4 % cream - cream topical Electronic Signature(s) Signed: 02/16/2023 1:00:30  PM By: Duanne Guess MD FACS Entered By: Duanne Guess on 02/16/2023 09:45:42 -------------------------------------------------------------------------------- Problem List Details Patient Name: Date of Service: [place], [name] 02/16/2023 9:15 A M Medical Record Number: [id number] Patient Account Number: [id number] Date of Birth/Sex: Treating RN: 12-27-1955 (67 y.o. M) Primary Care Provider: Arva Chafe Other Clinician: Referring Provider: Treating Provider/Extender: [name] in Treatment: 24 Active Problems ICD-10 Encounter Code Description Active Date MDM Diagnosis L97.512 Non-pressure chronic ulcer of other part of right foot with fat layer exposed 08/31/2022 No Yes I73.9 Peripheral vascular disease, unspecified 08/31/2022 No Yes I25.10 Atherosclerotic heart disease of native coronary artery without angina pectoris 08/31/2022 No Yes I10 Essential (primary) hypertension 08/31/2022 No Yes E11.65 Type 2 diabetes mellitus with hyperglycemia 08/31/2022 No Yes E11.40 Type 2 diabetes mellitus with diabetic neuropathy, unspecified 08/31/2022 No Yes I63.9 Cerebral infarction, unspecified 08/31/2022 No Yes Inactive Problems Auld, Emir A ([id number]) [id number] Page 6 of 11 Resolved Problems ICD-10 Code Description Active Date Resolved Date L97.821 Non-pressure chronic ulcer of other part of left lower leg limited to breakdown of skin 11/05/2022 11/05/2022 Electronic Signature(s) Signed: 02/16/2023 9:42:12 AM By: Duanne Guess MD FACS Entered By: Duanne Guess on 02/16/2023 09:42:12 -------------------------------------------------------------------------------- Progress Note Details Patient Name:  Date of Service: [name] Pian, [name] 02/16/2023 9:15 A M Medical Record Number: [id number] Patient Account Number: [id number] Date of Birth/Sex: Treating RN: Dec 20, 1955 (67 y.o. M) Primary Care Provider: Arva Chafe Other Clinician: Referring Provider: Treating Provider/Extender: [name] in Treatment: 24 Subjective Chief Complaint Information obtained from Patient 05/14/2020; patient is here for review of an abrasion injury on the left lateral calf 08/31/2022: [name] (1st metatarsal base, plantar) History of Present Illness (HPI) ADMISSION 05/14/2020; this is a 67 year old man with multiple medical issues. Predominantly he has type 2 diabetes with a history of peripheral neuropathy and also history of fairly significant PAD. He had a left superficial femoral to posterior tibial artery bypass in February 2017 he also had an atherectomy and angioplasty by Dr. [name] of the right popliteal artery in 2016. He is supposed to be getting arterial studies annually however this was interrupted last year because of the pandemic. He tells [place] he was at [facility] 2 weeks ago was getting out of of the scooter and traumatized his left lateral lower leg. There was a lot of bleeding as the patient is on Plavix and Eliquis. They have been dressing this with Neosporin and doing a fairly good job. Wound measures 2.5 x 3.5 it does not have any depth he does not have a wound history in his legs outside of surgery however he does have chronic edema and skin changes suggestive of chronic venous disease possibly some degree of lymphedema as well. Past medical history includes type 2 diabetes with peripheral neuropathy and gait instability, lumbar spondylosis, obstructive sleep apnea, history of a left pontine CVA, basal cell skin cancer, atrial fibrillation on anticoagulation, significant PAD as noted with a left superficial femoral to posterior tibial bypass in February 2017  and a right popliteal atherectomy and angioplasty by Dr. [name] in 30th 2016. He also has a history of coronary artery disease with an MI in 2002 hypertension hyperlipidemia and heart failure with preserved ejection fraction His last arterial studies I can see in epic were on 03/10/2018 this showed a right ABI of 0.69 and a right TBI of 0.5 with monophasic waveforms on the right. On the left his ABI was 1.20 with a TBI of 0.92  and triphasic waveforms. He has not had arterial studies since. Our nurse in the clinic got an ABI on the left of 1.1 11/2; left anterior leg wound in the setting of chronic venous insufficiency. Wound was initially trauma. We have been using Hydrofera Blue under compression he has home health.. The wound looks a lot better today with improvement in surface area 11/16; left anterior leg wound in the setting of chronic venous insufficiency. Wound was initially trauma. We have been using Hydrofera Blue under compression. The patient is closed today. He is supposed to follow-up with vein and vascular with regards to arterial insufficiency nevertheless his leg wounds are 12/3; apparently 2 weeks ago when they were putting on their stockings they managed to get 3 wounds on the left anterior lower leg from abrasion when putting on the stockings. Home health came by the week of Thanksgiving and put Hydrofera Blue 4-layer wrap on this and there is only one superficial area remaining. The patient and his wife complained about the difficulties getting stockings on I think we are using 20/30. We will order bilateral external compression stockings which should be easier. 12/10; wound on the left anterior lower leg is closed. He has chronic venous insufficiency we ordered him Farrow wrap stockings unfortunately he did not bring these in. 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 He says that it has been present since mid  December. He is currently residing in [facility] until March and a podiatrist has been looking after him there. They have been simply painting the area with Betadine. He has not had any lower extremity arterial studies since 2019, at which time his right ABI was 0.69. Measured in clinic today, it was 0.71. He is not aware of his most recent hemoglobin A1c, but historically he has had exceptionally poor control. On the basis of his right first metatarsal, there is a small crescent shaped wound. There is surrounding eschar and callus. There is no malodor or purulent drainage. 09/08/2022: The original wound is smaller today and fairly clean, but there is some discoloration and a pulpy texture to the adjacent callus. Underneath this, the tissue is open exposing the fat layer. It looks like perhaps there was a crack in the callus and moisture got under the skin and caused breakdown. Karaffa, Nash A ([id number]) [id number] Page 7 of 11 09/15/2022: There has been more moisture related tissue breakdown. The callus is very soft and the underlying tissue is more open. 09/28/2022: The wound looks much better. He has done a good job keeping it dry. There is some callus overlying much of the wound surface. There is slough on the exposed open areas. 10/14/2022: There has been a lot of moisture related tissue breakdown. He is still forming callus over the top and then it seems that moisture gets underneath the callus and causes tissue damage. There is slough on the exposed wound surface. They will be moving back to the local area from the beach this weekend. 10/21/2022: His foot is less wet, but there is no significant change to his wound. He has developed a blister on his left anterior tibial surface. There is no open wound here, but he does have some fairly significant edema. We are planning to apply a total contact cast today. 10/23/2022: Here for his obligatory first cast change. He says he  has not had any issues wearing the cast or walking in the boot. No detrimental  effects on his wound. 10/29/2022: The wound is looking better. Clearly, putting him in the cast has prevented water from getting in under his callus and causing further tissue breakdown. The blister on his left anterior tibial surface has not yet ruptured. Edema control is significantly improved. 11/05/2022: The wound on his right plantar foot is getting better. There is still some callus accumulation around the wounds, but there are just 2 open areas now, a very small 1 on the medial aspect of his metatarsal head and a little bit larger 1 on the plantar surface. The blister on his left anterior tibial surface is now open with hanging dry skin. 11/12/2022: The left anterior tibial wound is closed. The wound on his right plantar foot is smaller with just a little bit of callus around the edges. 11/19/2022: The right plantar foot wound continues to contract. The surface is quite clean. The tiny satellite area on the medial aspect of his foot has closed. 11/26/2022: The wound is smaller again today. He is responding well to the offloading effects of total contact casting. 12/03/2022: The wound is about the same size today. There is a little bit of moisture around the perimeter of the wound. No significant tissue breakdown. 12/10/2022: The wound is smaller today. There has been no moisture-related tissue breakdown of any maceration. 12/21/2022: The wound continues to contract. Moisture control is excellent. 12/28/2022: The wound is a little bit smaller today. 01/05/2023: The wound measures smaller today. Excellent moisture control with zero evidence of maceration. The wound surface is clean with healthy granulation tissue. 01/13/2023: We are leaving his Apligraf in situ for a second week. There is no discolored drainage or malodor coming from the site. 01/19/2023: The wound is smaller today. The periwound skin is in good condition without  tissue maceration and the healed areas of tissue appear to have improved integrity. 01/27/2023: Continued contraction of the wound. No tissue maceration. The wound surface is flush with the surrounding skin. 02/02/2023: The wound measured a little bit smaller today. There is some bruising just adjacent to the wound, but this has not resulted in any tissue breakdown. 02/09/2023: The wound is smaller again today. The area of bruising that was seen last week has resolved and there has been no tissue damage as a result. There is some callus accumulation around the wound edges along with minimal soft slough on the surface. 02/16/2023: The wound measured a little bit larger today. There is a little bit of maceration around the wound edges, which is likely responsible. Patient History Information obtained from Patient. Family History Unknown History. Social History Never smoker, Marital Status - Single, Alcohol Use - Never, Drug Use - No History, Caffeine Use - Rarely. Medical History Respiratory Patient has history of Sleep Apnea Cardiovascular Patient has history of Arrhythmia - A-Fib, Coronary Artery Disease - s/p CABG, Deep Vein Thrombosis, Hypertension, Myocardial Infarction, Peripheral Arterial Disease - s/p Fempop x 2 Endocrine Patient has history of Type II Diabetes Neurologic Patient has history of Neuropathy Hospitalization/Surgery History - colonoscopy. - polypectomy. - peripheral vascular cath. - shoulder arthroscopy. - carpal tunnel release. - coronary artery bypass. - appendectomy. - cardiac cath. - coronary angioplasty. - thrombectomy. - knee arthroplasty. - popliteal artery stent. 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 Bradycardic, asymptomatic. no  acute distress. Vitals Time Taken: 9:02 AM, Height: 70 in,  Weight: 260 lbs, BMI: 37.3, Temperature: 98.4 F, Pulse: 51 bpm, Respiratory Rate: 20 breaths/min, Blood Pressure: 207/68 mmHg. Respiratory Normal work of breathing on room air. General Notes: 02/16/2023: The wound measured a little bit larger today. There is a little bit of maceration around the wound edges, which is likely responsible. Integumentary (Hair, Skin) Wound #2 status is Open. Original cause of wound was Gradually Appeared. The date acquired was: 07/08/2022. The wound has been in treatment 24 weeks. The wound is located on the Right,Plantar Foot. The wound measures 0.8cm length x 0.8cm width x 0.1cm depth; 0.503cm^2 area and 0.05cm^3 volume. There is Fat Layer (Subcutaneous Tissue) exposed. There is no tunneling or undermining noted. There is a medium amount of serosanguineous drainage noted. The wound margin is distinct with the outline attached to the wound base. There is large (67-100%) red, pink granulation within the wound bed. There is a small (1- 33%) amount of necrotic tissue within the wound bed including Adherent Slough. The periwound skin appearance had no abnormalities noted for color. The periwound skin appearance exhibited: Callus. The periwound skin appearance did not exhibit: Maceration. Periwound temperature was noted as No Abnormality. Assessment Active Problems ICD-10 Non-pressure chronic ulcer of other part of right foot with fat layer exposed Peripheral vascular disease, unspecified Atherosclerotic heart disease of native coronary artery without angina pectoris Essential (primary) hypertension Type 2 diabetes mellitus with hyperglycemia Type 2 diabetes mellitus with diabetic neuropathy, unspecified Cerebral infarction, unspecified Procedures Wound #2 Pre-procedure diagnosis of Wound #2 is a Diabetic Wound/Ulcer of the Lower Extremity located on the Right,Plantar Foot. A skin graft procedure using  a bioengineered skin substitute/cellular or tissue based product was performed by Duanne Guess, MD with the following instrument(s): Forceps and Scissors. Epicord was applied and secured with Steri-Strips. 6 sq cm of product was utilized and 0 sq cm was wasted due to wound size. Post Application, adaptic was applied. A Time Out was conducted at 09:21, prior to the start of the procedure. The procedure was tolerated well with a pain level of 0 throughout and a pain level of 0 following the procedure. Post procedure Diagnosis Wound #2: Same as Pre-Procedure General Notes: scribed for Dr. Lady Gary by Samuella Bruin, RN. Pre-procedure diagnosis of Wound #2 is a Diabetic Wound/Ulcer of the Lower Extremity located on the Right,Plantar Foot . There was a T Contact Cast otal Procedure by Duanne Guess, MD. Post procedure Diagnosis Wound #2: Same as Pre-Procedure Notes: scribed for Dr. Lady Gary by Samuella Bruin, RN. Plan Follow-up Appointments: Return Appointment in 1 week. - Dr. Lady Gary - room 2 Anesthetic: (In clinic) Topical Lidocaine 4% applied to wound bed Cellular or Tissue Based Products: Wound #2 Right,Plantar Foot: Cellular or Tissue Based Product Type: - Epicord #5 Cellular or Tissue Based Product applied to wound bed, secured with steri-strips, cover with Adaptic or Mepitel. (DO NOT REMOVE). Bathing/ Shower/ Hygiene: May shower with protection but do not get wound dressing(s) wet. Protect dressing(s) with water repellant cover (for example, large plastic bag) or a cast cover and may then take shower. Edema Control - Lymphedema / SCD / Other: Elevate legs to the level of the heart or above for 30 minutes daily and/or when sitting for 3-4 times a day throughout the day. Avoid standing for long periods of time. Patient to wear own compression stockings every day. Moisturize legs daily. Off-Loading: Removable cast walker boot to: - right leg Wamser, Daquane A ([id number])  [id number] Page 9 of 11 The following medication(s) was prescribed:  lidocaine topical 4 % cream cream topical was prescribed at facility WOUND #2: - Foot Wound Laterality: Plantar, Right Cleanser: Soap and Water 1 x Per Week/30 Days Discharge Instructions: May shower and wash wound with dial antibacterial soap and water prior to dressing change. Cleanser: Wound Cleanser 1 x Per Week/30 Days Discharge Instructions: Cleanse the wound with wound cleanser prior to applying a clean dressing using gauze sponges, not tissue or cotton balls. Peri-Wound Care: Zinc Oxide Ointment 30g tube 1 x Per Week/30 Days Discharge Instructions: Apply Zinc Oxide to periwound with each dressing change Prim Dressing: ADAPTIC TOUCH 3x4.25 (in/in) 1 x Per Week/30 Days ary Discharge Instructions: Apply to wound bed as instructed Prim Dressing: EPICORD 1 x Per Week/30 Days ary Discharge Instructions: #3 (02/02/23) Prim Dressing: TCC 1 x Per Week/30 Days ary Secondary Dressing: Woven Gauze Sponge, Non-Sterile 4x4 in 1 x Per Week/30 Days Discharge Instructions: Apply over primary dressing as directed. Secondary Dressing: Zetuvit Plus 4x8 in 1 x Per Week/30 Days Discharge Instructions: Apply over primary dressing as directed. Secured With: [employer], 4.5x3.1 (in/yd) 1 x Per Week/30 Days Discharge Instructions: Secure with Kerlix as directed. 02/16/2023: The wound measured a little bit larger today. There is a little bit of maceration around the wound edges, which is likely responsible. The wound was prepared for total contact casting and Epicord application. The Epicord was rehydrated with saline and applied to the wound in standard fashion. Zinc oxide was applied to the periwound and everything was secured with Adaptic and Steri-Strips. A total contact cast was then applied in standard fashion. He will follow-up in 1 week. Electronic Signature(s) Signed: 02/16/2023 9:46:51 AM By:  Duanne Guess MD FACS Entered By: Duanne Guess on 02/16/2023 09:46:51 -------------------------------------------------------------------------------- HxROS Details Patient Name: Date of Service: [name] Pian, [name] 02/16/2023 9:15 A M Medical Record Number: [id number] Patient Account Number: [id number] Date of Birth/Sex: Treating RN: [birth year] (67 y.o. M) Primary Care Provider: Arva Chafe Other Clinician: Referring Provider: Treating Provider/Extender: [name] in Treatment: 24 Information Obtained From Patient Respiratory Medical History: Positive for: Sleep Apnea Cardiovascular Medical History: Positive for: Arrhythmia - A-Fib; Coronary Artery Disease - s/p CABG; Deep Vein Thrombosis; Hypertension; Myocardial Infarction; Peripheral Arterial Disease - s/p Fempop x 2 Past Medical History Notes: CVA x 3 Endocrine Medical History: Positive for: Type II Diabetes Time with diabetes: since 1997 Treated with: Insulin, Oral agents Blood sugar tested every day: Yes Tested : 2-3x per day Musculoskeletal Medical History: Past Medical History NotesBRODEN, MAZIN A ([id number]) [id number] Page 10 of 11 carpal tunnel syndrome Neurologic Medical History: Positive for: Neuropathy Past Medical History Notes: stroke Oncologic Medical History: Past Medical History Notes: skin cancer Immunizations Pneumococcal Vaccine: Received Pneumococcal Vaccination: Yes Received Pneumococcal Vaccination On or After 60th Birthday: Yes Implantable Devices None Hospitalization / Surgery History Type of Hospitalization/Surgery colonoscopy polypectomy peripheral vascular cath shoulder arthroscopy carpal tunnel release coronary artery bypass appendectomy cardiac cath coronary angioplasty thrombectomy knee arthroplasty popliteal artery stent Family and Social History Unknown History: Yes; Never smoker; Marital Status -  Single; Alcohol Use: Never; Drug Use: No History; Caffeine Use: Rarely; Financial Concerns: No; Food, Clothing or Shelter Needs: No; Support System Lacking: No; Transportation Concerns: No Psychologist, prison and probation services) Signed: 02/16/2023 1:00:30 PM By: Duanne Guess MD FACS Entered By: Duanne Guess on 02/16/2023 09:45:03 -------------------------------------------------------------------------------- Total Contact Cast Details Patient Name: Date of Service: [name] RK A. 02/16/2023 9:15 A M Medical Record Number: [id number] Patient Account Number: [id number] Date of  Birth/Sex: Treating RN: 29-Mar-1956 (67 y.o. Marlan [name] Primary Care Provider: Arva Chafe Other Clinician: Referring Provider: Treating Provider/Extender: [name] in Treatment: 24 T Contact Cast Applied for Wound Assessment: otal Wound #2 Right,Plantar Foot Performed By: Physician Duanne Guess, MD Post Procedure Diagnosis Same as Pre-procedure Notes scribed for Dr. Lady Gary by Samuella Bruin, RN Electronic Signature(s) Signed: 02/16/2023 1:00:30 PM By: Duanne Guess MD FACS Ormond, [place] A ([id number]) [phone].pdf Page 11 of 11 Signed: 02/16/2023 4:02:14 PM By: Gelene Mink By: Samuella Bruin on 02/16/2023 09:18:01 -------------------------------------------------------------------------------- SuperBill Details Patient Name: Date of Service: [name] RK A. 02/16/2023 Medical Record Number: [id number] Patient Account Number: [id number] Date of Birth/Sex: Treating RN: [birth year] (67 y.o. M) Primary Care Provider: Arva Chafe Other Clinician: Referring Provider: Treating Provider/Extender: [name] in Treatment: 24 Diagnosis Coding ICD-10 Codes Code Description [phone] Non-pressure chronic ulcer of other part of right foot with fat layer exposed I73.9 Peripheral vascular disease,  unspecified I25.10 Atherosclerotic heart disease of native coronary artery without angina pectoris I10 Essential (primary) hypertension E11.65 Type 2 diabetes mellitus with hyperglycemia E11.40 Type 2 diabetes mellitus with diabetic neuropathy, unspecified I63.9 Cerebral infarction, unspecified Facility Procedures : CPT4 Code: [id number] Description: Q4187 Epicord 2cm x 3cm - per sqcm Modifier: Quantity: 6 : CPT4 Code: [id number] Description: 15275 - SKIN SUB GRAFT FACE/NK/HF/G ICD-10 Diagnosis Description L97.512 Non-pressure chronic ulcer of other part of right foot with fat layer exposed Modifier: Quantity: 1 : CPT4 Code: [id number] Description: 93235 - APPLY TOTAL CONTACT LEG CAST ICD-10 Diagnosis Description L97.512 Non-pressure chronic ulcer of other part of right foot with fat layer exposed Modifier: Quantity: 1 Physician Procedures : CPT4 Code Description Modifier [id number] - WC PHYS LEVEL 3 - EST PT 25 ICD-10 Diagnosis Description L97.512 Non-pressure chronic ulcer of other part of right foot with fat layer exposed E11.40 Type 2 diabetes mellitus with diabetic neuropathy,  unspecified I73.9 Peripheral vascular disease, unspecified E11.65 Type 2 diabetes mellitus with hyperglycemia Quantity: 1 : [id number] 15275 - WC PHYS SKIN SUB GRAFT FACE/NK/HF/G ICD-10 Diagnosis Description L97.512 Non-pressure chronic ulcer of other part of right foot with fat layer exposed Quantity: 1 : [id number] 29445 - WC PHYS APPLY TOTAL CONTACT CAST ICD-10 Diagnosis Description L97.512 Non-pressure chronic ulcer of other part of right foot with fat layer exposed Quantity: 1 Electronic Signature(s) Signed: 02/16/2023 9:47:21 AM By: Duanne Guess MD FACS Entered By: Duanne Guess on 02/16/2023 09:47:20

## 2023-02-17 DIAGNOSIS — G4733 Obstructive sleep apnea (adult) (pediatric): Secondary | ICD-10-CM | POA: Diagnosis not present

## 2023-02-17 NOTE — Telephone Encounter (Signed)
PT returning call to further discuss instructions. Please advise

## 2023-02-17 NOTE — Telephone Encounter (Signed)
Malena Peer can you please sign this telephone encounter, it is saying it needs to be completed by you even though its my telephone encounter  Thanks

## 2023-02-17 NOTE — Telephone Encounter (Signed)
I left a detailed message for patient  to hold Plavix 5 days before his procedure and to hold his Eliquis 1 day before his procedure. Told patient to call us back so we can be sure he got his message

## 2023-02-18 NOTE — Telephone Encounter (Signed)
Spoke with patient and explained to him the details of holding his Plavix and Eliquis, He is also taking Ozempic which he will hold 7 days prior to the procedure.

## 2023-02-19 ENCOUNTER — Other Ambulatory Visit: Payer: Self-pay

## 2023-02-19 MED ORDER — METOPROLOL SUCCINATE ER 25 MG PO TB24: ORAL_TABLET | ORAL | 3 refills | Status: DC

## 2023-02-23 ENCOUNTER — Encounter (HOSPITAL_COMMUNITY): Payer: Self-pay | Admitting: Gastroenterology

## 2023-02-23 ENCOUNTER — Telehealth: Payer: Self-pay | Admitting: Gastroenterology

## 2023-02-23 ENCOUNTER — Encounter (HOSPITAL_BASED_OUTPATIENT_CLINIC_OR_DEPARTMENT_OTHER): Payer: Medicare HMO | Attending: General Surgery | Admitting: General Surgery

## 2023-02-23 DIAGNOSIS — I4891 Unspecified atrial fibrillation: Secondary | ICD-10-CM | POA: Diagnosis not present

## 2023-02-23 DIAGNOSIS — Z951 Presence of aortocoronary bypass graft: Secondary | ICD-10-CM | POA: Insufficient documentation

## 2023-02-23 DIAGNOSIS — E11621 Type 2 diabetes mellitus with foot ulcer: Secondary | ICD-10-CM | POA: Insufficient documentation

## 2023-02-23 DIAGNOSIS — I252 Old myocardial infarction: Secondary | ICD-10-CM | POA: Diagnosis not present

## 2023-02-23 DIAGNOSIS — E1151 Type 2 diabetes mellitus with diabetic peripheral angiopathy without gangrene: Secondary | ICD-10-CM | POA: Diagnosis not present

## 2023-02-23 DIAGNOSIS — Z7902 Long term (current) use of antithrombotics/antiplatelets: Secondary | ICD-10-CM | POA: Diagnosis not present

## 2023-02-23 DIAGNOSIS — Z8673 Personal history of transient ischemic attack (TIA), and cerebral infarction without residual deficits: Secondary | ICD-10-CM | POA: Diagnosis not present

## 2023-02-23 DIAGNOSIS — L97512 Non-pressure chronic ulcer of other part of right foot with fat layer exposed: Secondary | ICD-10-CM | POA: Diagnosis not present

## 2023-02-23 DIAGNOSIS — I2510 Atherosclerotic heart disease of native coronary artery without angina pectoris: Secondary | ICD-10-CM | POA: Insufficient documentation

## 2023-02-23 DIAGNOSIS — I10 Essential (primary) hypertension: Secondary | ICD-10-CM | POA: Diagnosis not present

## 2023-02-23 DIAGNOSIS — Z7901 Long term (current) use of anticoagulants: Secondary | ICD-10-CM | POA: Diagnosis not present

## 2023-02-23 DIAGNOSIS — Z85828 Personal history of other malignant neoplasm of skin: Secondary | ICD-10-CM | POA: Insufficient documentation

## 2023-02-23 DIAGNOSIS — G4733 Obstructive sleep apnea (adult) (pediatric): Secondary | ICD-10-CM | POA: Insufficient documentation

## 2023-02-23 NOTE — Progress Notes (Signed)
[name], [name] ([id number]) [id number] Page 1 of 11 Visit Report for 02/23/2023 Chief Complaint Document Details Patient Name: Date of Service: [name], [name]. 02/23/2023 10:00 [name] Medical Record Number: [id number] Patient Account Number: [id number] Date of Birth/Sex: Treating RN: 03-08-1956 (67 y.o. M) Primary Care Provider: Arva Chafe Other Clinician: Referring Provider: Treating Provider/Extender: [name] in Treatment: 25 Information Obtained from: Patient Chief Complaint 05/14/2020; patient is here for review of an abrasion injury on the left lateral calf 08/31/2022: [name] (1st metatarsal base, plantar) Electronic Signature(s) Signed: 02/23/2023 10:45:21 AM By: Duanne Guess MD FACS Entered By: Duanne Guess on 02/23/2023 10:45:21 -------------------------------------------------------------------------------- Cellular or Tissue Based Product Details Patient Name: Date of Service: [name]. 02/23/2023 10:00 [name] Medical Record Number: [id number] Patient Account Number: [id number] Date of Birth/Sex: Treating RN: 1956-01-01 (67 y.o. M) Primary Care Provider: Arva Chafe Other Clinician: Referring Provider: Treating Provider/Extender: [name] in Treatment: 25 Cellular or Tissue Based Product Type Wound #2 Right,Plantar Foot Applied to: Performed By: Physician Duanne Guess, MD Cellular or Tissue Based Product Type: Epicord Level of Consciousness (Pre-procedure): Awake and Alert Pre-procedure Verification/Time Out Yes - 10:30 Taken: Location: genitalia / hands / feet / multiple digits Wound Size (sq cm): 1.32 Product Size (sq cm): 6 Waste Size (sq cm): 1.5 Waste Reason: Wound size Amount of Product Applied (sq cm): 4.5 Instrument Used: Forceps, Scissors Lot #: [id number] Order #: [id number] Expiration Date: 08/21/2027 Fenestrated: No Reconstituted:  Yes Solution Type: Normal Saline Solution Amount: 6 ml Lot #: [id number] KS Solution Expiration Date: 08/10/2024 Secured: Yes Secured With: Steri-Strips Dressing Applied: Yes Primary Dressing: Gauze and Adaptic Procedural Pain: 0 [name], [name] ([id number]) [id number] Page 2 of 11 Post Procedural Pain: 0 Response to Treatment: Procedure was tolerated well Level of Consciousness (Post- Awake and Alert procedure): Post Procedure Diagnosis Same as Pre-procedure Notes Scribed for Dr. Lady Gary by J.Scotton Electronic Signature(s) Signed: 02/23/2023 10:45:14 AM By: Duanne Guess MD FACS Entered By: Duanne Guess on 02/23/2023 10:45:13 -------------------------------------------------------------------------------- HPI Details Patient Name: Date of Service: [name], [name]. 02/23/2023 10:00 [name] Medical Record Number: [id number] Patient Account Number: [id number] Date of Birth/Sex: Treating RN: 23-Jul-1955 (67 y.o. M) Primary Care Provider: Arva Chafe Other Clinician: Referring Provider: Treating Provider/Extender: [name] in Treatment: 25 History of Present Illness HPI Description: ADMISSION 05/14/2020; this is [name] with multiple medical issues. Predominantly he has type 2 diabetes with [name] and also history of fairly significant PAD. He had [name] in February 2017 he also had an atherectomy and angioplasty by Dr. [name] of the right popliteal artery in 2016. He is supposed to be getting arterial studies annually however this was interrupted last year because of the pandemic. He tells [place] he was at [facility] 2 weeks ago was getting out of of the scooter and traumatized his left lateral lower leg. There was [name] as the patient is on Plavix and Eliquis. They have been dressing this with Neosporin and doing [name] fairly good job.  Wound measures 2.5 x 3.5 it does not have any depth he does not have [name] in his legs outside of surgery however he does have chronic edema and skin changes suggestive of chronic venous disease possibly some degree of lymphedema as well. Past medical history includes type 2 diabetes with peripheral neuropathy and gait instability, lumbar  spondylosis, obstructive sleep apnea, history of [name], basal cell skin cancer, atrial fibrillation on anticoagulation, significant PAD as noted with [name] and [name] in 30th 2016. He also has [name] of coronary artery disease with an MI in 2002 hypertension hyperlipidemia and heart failure with preserved ejection fraction His last arterial studies I can see in epic were on 03/10/2018 this showed [name] and [name] with monophasic waveforms on the right. On the left his ABI was 1.20 with [name] and triphasic waveforms. He has not had arterial studies since. Our nurse in the clinic got an ABI on the left of 1.1 11/2; left anterior leg wound in the setting of chronic venous insufficiency. Wound was initially trauma. We have been using Hydrofera Blue under compression he has home health.. The wound looks [name] lot better today with improvement in surface area 11/16; left anterior leg wound in the setting of chronic venous insufficiency. Wound was initially trauma. We have been using Hydrofera Blue under compression. The patient is closed today. He is supposed to follow-up with vein and vascular with regards to arterial insufficiency nevertheless his leg wounds are 12/3; apparently 2 weeks ago when they were putting on their stockings they managed to get 3 wounds on the left anterior lower leg from abrasion when putting on the stockings. Home health came by the week of Thanksgiving and put Hydrofera Blue  4-layer wrap on this and there is only one superficial area remaining. The patient and his wife complained about the difficulties getting stockings on I think we are using 20/30. We will order bilateral external compression stockings which should be easier. 12/10; wound on the left anterior lower leg is closed. He has chronic venous insufficiency we ordered him Farrow wrap stockings unfortunately he did not bring these in. 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 He says that it has been present since mid December. He is currently residing in [facility] until March and [name] podiatrist has been looking after him there. They have been simply painting the area with Betadine. He has not had any lower extremity arterial studies since 2019, at which time his right ABI was 0.69. Measured in clinic today, it was 0.71. He is not aware of his most recent hemoglobin A1c, but historically he has had exceptionally poor control. On the basis of his right first metatarsal, there is [name] small crescent shaped wound. There is surrounding eschar and callus. There is no malodor or purulent drainage. 09/08/2022: The original wound is smaller today and fairly clean, but there is some discoloration and [name] pulpy texture to the adjacent callus. Underneath this, the tissue is open exposing the fat layer. It looks like perhaps there was [name] crack in the callus and moisture got under the skin and caused breakdown. 09/15/2022: There has been more moisture related tissue breakdown. The callus is very soft and the underlying tissue is more open. 09/28/2022: The wound looks much better. He has done [name] good job keeping it dry. There is some callus overlying much of the wound surface. There is slough on [name], [name] ([id number]) [id number] Page 3 of 11 the exposed open areas. 10/14/2022: There has been [name] lot of moisture related tissue breakdown.  He is still forming callus over the  top and then it seems that moisture gets underneath the callus and causes tissue damage. There is slough on the exposed wound surface. They will be moving back to the local area [place] the [place] this weekend. 10/21/2022: His foot is less wet, but there is no significant change to his wound. He has developed [name] blister on his left anterior tibial surface. There is no open wound here, but he does have some fairly significant edema. We are planning to apply [name] total contact cast today. 10/23/2022: Here for his obligatory first cast change. He says he has not had any issues wearing the cast or walking in the boot. No detrimental effects on his wound. 10/29/2022: The wound is looking better. Clearly, putting him in the cast has prevented water from getting in under his callus and causing further tissue breakdown. The blister on his left anterior tibial surface has not yet ruptured. Edema control is significantly improved. 11/05/2022: The wound on his right plantar foot is getting better. There is still some callus accumulation around the wounds, but there are just 2 open areas now, [name] very small 1 on the medial aspect of his metatarsal head and [name] little bit larger 1 on the plantar surface. The blister on his left anterior tibial surface is now open with hanging dry skin. 11/12/2022: The left anterior tibial wound is closed. The wound on his right plantar foot is smaller with just [name] little bit of callus around the edges. 11/19/2022: The right plantar foot wound continues to contract. The surface is quite clean. The tiny satellite area on the medial aspect of his foot has closed. 11/26/2022: The wound is smaller again today. He is responding well to the offloading effects of total contact casting. 12/03/2022: The wound is about the same size today. There is [name] little bit of moisture around the perimeter of the wound. No significant tissue breakdown. 12/10/2022: The wound is smaller today.  There has been no moisture-related tissue breakdown of any maceration. 12/21/2022: The wound continues to contract. Moisture control is excellent. 12/28/2022: The wound is [name] little bit smaller today. 01/05/2023: The wound measures smaller today. Excellent moisture control with zero evidence of maceration. The wound surface is clean with healthy granulation tissue. 01/13/2023: We are leaving his Apligraf in situ for [name] second week. There is no discolored drainage or malodor coming from the site. 01/19/2023: The wound is smaller today. The periwound skin is in good condition without tissue maceration and the healed areas of tissue appear to have improved integrity. 01/27/2023: Continued contraction of the wound. No tissue maceration. The wound surface is flush with the surrounding skin. 02/02/2023: The wound measured [name] little bit smaller today. There is some bruising just adjacent to the wound, but this has not resulted in any tissue breakdown. 02/09/2023: The wound is smaller again today. The area of bruising that was seen last week has resolved and there has been no tissue damage as [name] result. There is some callus accumulation around the wound edges along with minimal soft slough on the surface. 02/16/2023: The wound measured [name] little bit larger today. There is [name] little bit of maceration around the wound edges, which is likely responsible. 02/23/2023: The wound is smaller today and there is epithelium encroaching around the edges. There is no periwound tissue maceration, nor has he accumulated any significant callus. Electronic Signature(s) Signed: 02/23/2023 10:50:44 AM By: Duanne Guess MD FACS Entered By: Duanne Guess on 02/23/2023 10:50:44 -------------------------------------------------------------------------------- Physical Exam Details Patient Name: Date of  Service: [name], [name]. 02/23/2023 10:00 [name] Medical Record Number: [id number] Patient Account Number: [id number] Date of Birth/Sex: Treating  RN: Oct 23, 1955 (67 y.o. M) Primary Care Provider: Arva Chafe Other Clinician: Referring Provider: Treating Provider/Extender: [name] in Treatment: 25 Constitutional Hypertensive, asymptomatic. Bradycardic, asymptomatic. . . no acute distress. Respiratory Normal work of breathing on room air. Notes 02/23/2023: The wound is smaller today and there is epithelium encroaching around the edges. There is no periwound tissue maceration, nor has he accumulated any significant callus. EMREY, MANISCALCO [name] ([id number]) [id number] Page 4 of 11 Electronic Signature(s) Signed: 02/23/2023 10:57:07 AM By: Duanne Guess MD FACS Entered By: Duanne Guess on 02/23/2023 10:57:07 -------------------------------------------------------------------------------- Physician Orders Details Patient Name: Date of Service: [name], [name]. 02/23/2023 10:00 [name] Medical Record Number: [id number] Patient Account Number: [id number] Date of Birth/Sex: Treating RN: [birth year] (67 y.o. [name] Primary Care Provider: Arva Chafe Other Clinician: Referring Provider: Treating Provider/Extender: [name] in Treatment: 25 Verbal / Phone Orders: No Diagnosis Coding ICD-10 Coding Code Description [phone] Non-pressure chronic ulcer of other part of right foot with fat layer exposed I73.9 Peripheral vascular disease, unspecified I25.10 Atherosclerotic heart disease of native coronary artery without angina pectoris I10 Essential (primary) hypertension E11.65 Type 2 diabetes mellitus with hyperglycemia E11.40 Type 2 diabetes mellitus with diabetic neuropathy, unspecified I63.9 Cerebral infarction, unspecified Follow-up Appointments ppointment in 1 week. - Dr. Lady Gary - room 2 Return [name] Wed 03/03/23 at 9:15 am ppointment in 2 weeks. - Tuesday 03/09/23 at 9:15am Return [name] Anesthetic (In clinic) Topical Lidocaine 4%  applied to wound bed Cellular or Tissue Based Products Wound #2 Right,Plantar Foot Cellular or Tissue Based Product Type: - Epicord #6 Cellular or Tissue Based Product applied to wound bed, secured with steri-strips, cover with Adaptic or Mepitel. (DO NOT REMOVE). Bathing/ Shower/ Hygiene May shower with protection but do not get wound dressing(s) wet. Protect dressing(s) with water repellant cover (for example, large plastic bag) or [name] cast cover and may then take shower. Edema Control - Lymphedema / SCD / Other Elevate legs to the level of the heart or above for 30 minutes daily and/or when sitting for 3-4 times [name] day throughout the day. Avoid standing for long periods of time. Patient to wear own compression stockings every day. Moisturize legs daily. Off-Loading Removable cast walker boot to: - right leg Wound Treatment Wound #2 - Foot Wound Laterality: Plantar, Right Cleanser: Soap and Water 1 x Per Week/30 Days Discharge Instructions: May shower and wash wound with dial antibacterial soap and water prior to dressing change. Cleanser: Wound Cleanser 1 x Per Week/30 Days Discharge Instructions: Cleanse the wound with wound cleanser prior to applying [name] clean dressing using gauze sponges, not tissue or cotton balls. Peri-Wound Care: Zinc Oxide Ointment 30g tube 1 x Per Week/30 Days Discharge Instructions: Apply Zinc Oxide to periwound with each dressing change Topical: Skintegrity Hydrogel 4 (oz) 1 x Per Week/30 Days Discharge Instructions: Apply hydrogel as directed Prim Dressing: ADAPTIC TOUCH 3x4.25 (in/in) ary 1 x Per Week/30 Days [name], [name] ([id number]) [id number] Page 5 of 11 Discharge Instructions: Apply to wound bed as instructed Prim Dressing: EPICORD 1 x Per Week/30 Days ary Discharge Instructions: #6 (02/23/23) Prim Dressing: TCC #3 ary 1 x Per Week/30 Days Secondary Dressing: Woven Gauze Sponge, Non-Sterile 4x4 in 1 x Per Week/30  Days Discharge Instructions: Apply over primary dressing as directed. Secondary Dressing: Zetuvit Plus 4x8 in 1 x Per  Week/30 Days Discharge Instructions: Apply over primary dressing as directed. Secured With: [employer], 4.5x3.1 (in/yd) 1 x Per Week/30 Days Discharge Instructions: Secure with Kerlix as directed. Electronic Signature(s) Signed: 02/23/2023 11:15:53 AM By: Duanne Guess MD FACS Entered By: Duanne Guess on 02/23/2023 10:57:26 -------------------------------------------------------------------------------- Problem List Details Patient Name: Date of Service: [name], [name]. 02/23/2023 10:00 [name] Medical Record Number: [id number] Patient Account Number: [id number] Date of Birth/Sex: Treating RN: 1955-09-12 (67 y.o. M) Primary Care Provider: Arva Chafe Other Clinician: Referring Provider: Treating Provider/Extender: [name] in Treatment: 25 Active Problems ICD-10 Encounter Code Description Active Date MDM Diagnosis L97.512 Non-pressure chronic ulcer of other part of right foot with fat layer exposed 08/31/2022 No Yes I73.9 Peripheral vascular disease, unspecified 08/31/2022 No Yes I25.10 Atherosclerotic heart disease of native coronary artery without angina pectoris 08/31/2022 No Yes I10 Essential (primary) hypertension 08/31/2022 No Yes E11.65 Type 2 diabetes mellitus with hyperglycemia 08/31/2022 No Yes E11.40 Type 2 diabetes mellitus with diabetic neuropathy, unspecified 08/31/2022 No Yes I63.9 Cerebral infarction, unspecified 08/31/2022 No Yes Inactive Problems [name], [name] ([id number]) [id number] Page 6 of 11 Resolved Problems ICD-10 Code Description Active Date Resolved Date L97.821 Non-pressure chronic ulcer of other part of left lower leg limited to breakdown of skin 11/05/2022 11/05/2022 Electronic Signature(s) Signed: 02/23/2023 10:44:54 AM By: Duanne Guess MD FACS Entered By: Duanne Guess on 02/23/2023 10:44:54 -------------------------------------------------------------------------------- Progress Note Details Patient Name: Date of Service: [name], [name]. 02/23/2023 10:00 [name] Medical Record Number: [id number] Patient Account Number: [id number] Date of Birth/Sex: Treating RN: May 28, 1956 (67 y.o. M) Primary Care Provider: Arva Chafe Other Clinician: Referring Provider: Treating Provider/Extender: [name] in Treatment: 25 Subjective Chief Complaint Information obtained from Patient 05/14/2020; patient is here for review of an abrasion injury on the left lateral calf 08/31/2022: [name] (1st metatarsal base, plantar) History of Present Illness (HPI) ADMISSION 05/14/2020; this is [name] with multiple medical issues. Predominantly he has type 2 diabetes with [name] and also history of fairly significant PAD. He had [name] in February 2017 he also had an atherectomy and angioplasty by Dr. [name] of the right popliteal artery in 2016. He is supposed to be getting arterial studies annually however this was interrupted last year because of the pandemic. He tells [place] he was at [facility] 2 weeks ago was getting out of of the scooter and traumatized his left lateral lower leg. There was [name] as the patient is on Plavix and Eliquis. They have been dressing this with Neosporin and doing [name] fairly good job. Wound measures 2.5 x 3.5 it does not have any depth he does not have [name] in his legs outside of surgery however he does have chronic edema and skin changes suggestive of chronic venous disease possibly some degree of lymphedema as well. Past medical history includes type 2 diabetes with peripheral neuropathy and gait instability, lumbar spondylosis, obstructive sleep apnea, history of [name], basal cell skin  cancer, atrial fibrillation on anticoagulation, significant PAD as noted with [name] and [name] in 30th 2016. He also has [name] of coronary artery disease with an MI in 2002 hypertension hyperlipidemia and heart failure with preserved ejection fraction His last arterial studies I can see in epic  were on 03/10/2018 this showed [name] and [name] with monophasic waveforms on the right. On the left his ABI was 1.20 with [name] and triphasic waveforms. He has not had arterial studies since. Our nurse in the clinic got an ABI on the left of 1.1 11/2; left anterior leg wound in the setting of chronic venous insufficiency. Wound was initially trauma. We have been using Hydrofera Blue under compression he has home health.. The wound looks [name] lot better today with improvement in surface area 11/16; left anterior leg wound in the setting of chronic venous insufficiency. Wound was initially trauma. We have been using Hydrofera Blue under compression. The patient is closed today. He is supposed to follow-up with vein and vascular with regards to arterial insufficiency nevertheless his leg wounds are 12/3; apparently 2 weeks ago when they were putting on their stockings they managed to get 3 wounds on the left anterior lower leg from abrasion when putting on the stockings. Home health came by the week of Thanksgiving and put Hydrofera Blue 4-layer wrap on this and there is only one superficial area remaining. The patient and his wife complained about the difficulties getting stockings on I think we are using 20/30. We will order bilateral external compression stockings which should be easier. 12/10; wound on the left anterior lower leg is closed. He has chronic venous insufficiency we ordered him Farrow wrap stockings unfortunately he did not bring these  in. 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 He says that it has been present since mid December. He is currently residing in [facility] until March and [name] podiatrist has been looking after him there. They have been simply painting the area with Betadine. He has not had any lower extremity arterial studies since 2019, at which time his right ABI was 0.69. Measured in clinic today, it was 0.71. He is not aware of his most recent hemoglobin A1c, but historically he has had exceptionally poor control. On the basis of his right first metatarsal, there is [name] small crescent shaped wound. There is surrounding eschar and callus. There is no malodor or purulent drainage. 09/08/2022: The original wound is smaller today and fairly clean, but there is some discoloration and [name] pulpy texture to the adjacent callus. Underneath this, the tissue is open exposing the fat layer. It looks like perhaps there was [name] crack in the callus and moisture got under the skin and caused breakdown. 09/15/2022: There has been more moisture related tissue breakdown. The callus is very soft and the underlying tissue is more open. [name], [name] ([id number]) [id number] Page 7 of 11 09/28/2022: The wound looks much better. He has done [name] good job keeping it dry. There is some callus overlying much of the wound surface. There is slough on the exposed open areas. 10/14/2022: There has been [name] lot of moisture related tissue breakdown. He is still forming callus over the top and then it seems that moisture gets underneath the callus and causes tissue damage. There is slough on the exposed wound surface. They will be moving back to the local area [place] the beach this weekend. 10/21/2022: His foot is less wet, but there is no significant change to his wound. He has developed [name] blister on his left anterior tibial surface. There is no open wound here, but he  does have some fairly significant edema. We are  planning to apply [name] total contact cast today. 10/23/2022: Here for his obligatory first cast change. He says he has not had any issues wearing the cast or walking in the boot. No detrimental effects on his wound. 10/29/2022: The wound is looking better. Clearly, putting him in the cast has prevented water from getting in under his callus and causing further tissue breakdown. The blister on his left anterior tibial surface has not yet ruptured. Edema control is significantly improved. 11/05/2022: The wound on his right plantar foot is getting better. There is still some callus accumulation around the wounds, but there are just 2 open areas now, [name] very small 1 on the medial aspect of his metatarsal head and [name] little bit larger 1 on the plantar surface. The blister on his left anterior tibial surface is now open with hanging dry skin. 11/12/2022: The left anterior tibial wound is closed. The wound on his right plantar foot is smaller with just [name] little bit of callus around the edges. 11/19/2022: The right plantar foot wound continues to contract. The surface is quite clean. The tiny satellite area on the medial aspect of his foot has closed. 11/26/2022: The wound is smaller again today. He is responding well to the offloading effects of total contact casting. 12/03/2022: The wound is about the same size today. There is [name] little bit of moisture around the perimeter of the wound. No significant tissue breakdown. 12/10/2022: The wound is smaller today. There has been no moisture-related tissue breakdown of any maceration. 12/21/2022: The wound continues to contract. Moisture control is excellent. 12/28/2022: The wound is [name] little bit smaller today. 01/05/2023: The wound measures smaller today. Excellent moisture control with zero evidence of maceration. The wound surface is clean with healthy granulation tissue. 01/13/2023: We are leaving his Apligraf in situ for [name] second  week. There is no discolored drainage or malodor coming from the site. 01/19/2023: The wound is smaller today. The periwound skin is in good condition without tissue maceration and the healed areas of tissue appear to have improved integrity. 01/27/2023: Continued contraction of the wound. No tissue maceration. The wound surface is flush with the surrounding skin. 02/02/2023: The wound measured [name] little bit smaller today. There is some bruising just adjacent to the wound, but this has not resulted in any tissue breakdown. 02/09/2023: The wound is smaller again today. The area of bruising that was seen last week has resolved and there has been no tissue damage as [name] result. There is some callus accumulation around the wound edges along with minimal soft slough on the surface. 02/16/2023: The wound measured [name] little bit larger today. There is [name] little bit of maceration around the wound edges, which is likely responsible. 02/23/2023: The wound is smaller today and there is epithelium encroaching around the edges. There is no periwound tissue maceration, nor has he accumulated any significant callus. Patient History Information obtained from Patient. Family History Unknown History. Social History Never smoker, Marital Status - Single, Alcohol Use - Never, Drug Use - No History, Caffeine Use - Rarely. Medical History Respiratory Patient has history of Sleep Apnea Cardiovascular Patient has history of Arrhythmia - [name]-Fib, Coronary Artery Disease - s/p CABG, Deep Vein Thrombosis, Hypertension, Myocardial Infarction, Peripheral Arterial Disease - s/p Fempop x 2 Endocrine Patient has history of Type II Diabetes Neurologic Patient has history of Neuropathy Hospitalization/Surgery History - colonoscopy. - polypectomy. - peripheral vascular cath. - shoulder arthroscopy. - carpal tunnel release. - coronary artery bypass. - appendectomy. -  cardiac cath. - coronary angioplasty. - thrombectomy. - knee arthroplasty.  - popliteal artery stent. 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 Bradycardic, asymptomatic. no acute distress. Vitals Time Taken: 10:18 AM, Height: 70 in, Weight: 260 lbs, BMI: 37.3, Temperature: 98.6 F, Pulse: 53 bpm, Respiratory Rate: 18 breaths/min, Blood Pressure: 160/82 mmHg. Respiratory Normal work of breathing on room air. General Notes: 02/23/2023: The wound is smaller today and there is epithelium encroaching around the edges. There is no periwound tissue maceration, nor has he accumulated any significant callus. Integumentary (Hair, Skin) Wound #2 status is Open. Original cause of wound was Gradually Appeared. The date acquired was: 07/08/2022. The wound has been in treatment 25 weeks. The wound is located on the Right,Plantar Foot. The wound measures 1.1cm length x 1.2cm width x 0.1cm depth; 1.037cm^2 area and 0.104cm^3 volume. There is Fat Layer (Subcutaneous Tissue) exposed. There is no tunneling or undermining noted. There is [name] medium amount of serosanguineous drainage noted. The wound margin is distinct with the outline attached to the wound base. There is large (67-100%) red, pink granulation within the wound bed. There is [name] small (1-33%) amount of necrotic tissue within the wound bed including Adherent Slough. The periwound skin appearance had no abnormalities noted for color. The periwound skin appearance exhibited: Callus. The periwound skin appearance did not exhibit: Maceration. Periwound temperature was noted as No Abnormality. Assessment Active Problems ICD-10 Non-pressure chronic ulcer of other part of right foot with fat layer exposed Peripheral vascular disease, unspecified Atherosclerotic heart disease of native coronary artery  without angina pectoris Essential (primary) hypertension Type 2 diabetes mellitus with hyperglycemia Type 2 diabetes mellitus with diabetic neuropathy, unspecified Cerebral infarction, unspecified Procedures Wound #2 Pre-procedure diagnosis of Wound #2 is [name] Diabetic Wound/Ulcer of the Lower Extremity located on the Right,Plantar Foot. [name] skin graft procedure using [name] bioengineered skin substitute/cellular or tissue based product was performed by Duanne Guess, MD with the following instrument(s): Forceps and Scissors. Epicord was applied and secured with Steri-Strips. 4.5 sq cm of product was utilized and 1.5 sq cm was wasted due to Wound size. Post Application, Gauze and Adaptic was applied. [name] Time Out was conducted at 10:30, prior to the start of the procedure. The procedure was tolerated well with [name] pain level of 0 throughout and [name] pain level of 0 following the procedure. Post procedure Diagnosis Wound #2: Same as Pre-Procedure General Notes: Scribed for Dr. Lady Gary by J.Scotton. Pre-procedure diagnosis of Wound #2 is [name] Diabetic Wound/Ulcer of the Lower Extremity located on the Right,Plantar Foot . There was [name] T Contact Cast otal Procedure by Duanne Guess, MD. Post procedure Diagnosis Wound #2: Same as Pre-Procedure Plan Follow-up Appointments: Return Appointment in 1 week. - Dr. Lady Gary - room 2 Wed 03/03/23 at 9:15 am Return Appointment in 2 weeks. - Tuesday 03/09/23 at 9:15am Anesthetic: (In clinic) Topical Lidocaine 4% applied to wound bed Cellular or Tissue Based Products: Wound #2 Right,Plantar Foot: Cellular or Tissue Based Product Type: - Epicord #6 Cellular or Tissue Based Product applied to wound bed, secured with steri-strips, cover with Adaptic or Mepitel. (DO NOT REMOVE). Bathing/ Shower/ Hygiene: May shower with protection but do not get wound dressing(s) wet. Protect dressing(s) with water repellant cover (for example, large plastic bag) or [name] cast cover and may then  take shower. Edema Control - Lymphedema / SCD /  Other: Elevate legs to the level of the heart or above for 30 minutes daily and/or when sitting for 3-4 times [name] day throughout the day. [name], [name] ([id number]) [id number] Page 9 of 11 Avoid standing for long periods of time. Patient to wear own compression stockings every day. Moisturize legs daily. Off-Loading: Removable cast walker boot to: - right leg WOUND #2: - Foot Wound Laterality: Plantar, Right Cleanser: Soap and Water 1 x Per Week/30 Days Discharge Instructions: May shower and wash wound with dial antibacterial soap and water prior to dressing change. Cleanser: Wound Cleanser 1 x Per Week/30 Days Discharge Instructions: Cleanse the wound with wound cleanser prior to applying [name] clean dressing using gauze sponges, not tissue or cotton balls. Peri-Wound Care: Zinc Oxide Ointment 30g tube 1 x Per Week/30 Days Discharge Instructions: Apply Zinc Oxide to periwound with each dressing change Topical: Skintegrity Hydrogel 4 (oz) 1 x Per Week/30 Days Discharge Instructions: Apply hydrogel as directed Prim Dressing: ADAPTIC TOUCH 3x4.25 (in/in) 1 x Per Week/30 Days ary Discharge Instructions: Apply to wound bed as instructed Prim Dressing: EPICORD 1 x Per Week/30 Days ary Discharge Instructions: #6 (02/23/23) Prim Dressing: TCC #3 1 x Per Week/30 Days ary Secondary Dressing: Woven Gauze Sponge, Non-Sterile 4x4 in 1 x Per Week/30 Days Discharge Instructions: Apply over primary dressing as directed. Secondary Dressing: Zetuvit Plus 4x8 in 1 x Per Week/30 Days Discharge Instructions: Apply over primary dressing as directed. Secured With: [employer], 4.5x3.1 (in/yd) 1 x Per Week/30 Days Discharge Instructions: Secure with Kerlix as directed. 02/23/2023: The wound is smaller today and there is epithelium encroaching around the edges. There is no periwound tissue maceration, nor has he accumulated any  significant callus. The wound bed was prepared for Epicord application. The Epicord was rehydrated with saline and applied in standard fashion. I applied zinc oxide to the periwound and secured the skin substitute with Adaptic and Steri-Strips. [name] total contact cast was then applied. He will follow-up in 1 week. Electronic Signature(s) Signed: 02/23/2023 11:00:00 AM By: Duanne Guess MD FACS Entered By: Duanne Guess on 02/23/2023 10:59:59 -------------------------------------------------------------------------------- HxROS Details Patient Name: Date of Service: [name], [name]. 02/23/2023 10:00 [name] Medical Record Number: [id number] Patient Account Number: [id number] Date of Birth/Sex: Treating RN: 10-10-1955 (67 y.o. M) Primary Care Provider: Arva Chafe Other Clinician: Referring Provider: Treating Provider/Extender: [name] in Treatment: 25 Information Obtained From Patient Respiratory Medical History: Positive for: Sleep Apnea Cardiovascular Medical History: Positive for: Arrhythmia - [name]-Fib; Coronary Artery Disease - s/p CABG; Deep Vein Thrombosis; Hypertension; Myocardial Infarction; Peripheral Arterial Disease - s/p Fempop x 2 Past Medical History Notes: CVA x 3 Endocrine Medical History: Positive for: Type II Diabetes Time with diabetes: since 1997 Treated with: Insulin, Oral agents Blood sugar tested every day: Yes Tested : 2-3x per day Schou, Ramez [name] ([id number]) [id number] Page 10 of 11 Musculoskeletal Medical History: Past Medical History Notes: carpal tunnel syndrome Neurologic Medical History: Positive for: Neuropathy Past Medical History Notes: stroke Oncologic Medical History: Past Medical History Notes: skin cancer Immunizations Pneumococcal Vaccine: Received Pneumococcal Vaccination: Yes Received Pneumococcal Vaccination On or After 60th Birthday: Yes Implantable  Devices None Hospitalization / Surgery History Type of Hospitalization/Surgery colonoscopy polypectomy peripheral vascular cath shoulder arthroscopy carpal tunnel release coronary artery bypass appendectomy cardiac cath coronary angioplasty thrombectomy knee arthroplasty popliteal artery stent Family and Social History Unknown History: Yes; Never smoker; Marital Status - Single; Alcohol Use: Never; Drug Use: No History; Caffeine Use:  Rarely; Financial Concerns: No; Food, Clothing or Shelter Needs: No; Support System Lacking: No; Transportation Concerns: No Electronic Signature(s) Signed: 02/23/2023 11:15:53 AM By: Duanne Guess MD FACS Entered By: Duanne Guess on 02/23/2023 10:56:30 -------------------------------------------------------------------------------- Total Contact Cast Details Patient Name: Date of Service: [name] Sabal [name]. 02/23/2023 10:00 [name] Medical Record Number: [id number] Patient Account Number: [id number] Date of Birth/Sex: Treating RN: 12-28-1955 (67 y.o. [name] Primary Care Provider: Arva Chafe Other Clinician: Referring Provider: Treating Provider/Extender: [name] in Treatment: 25 T Contact Cast Applied for Wound Assessment: otal Wound #2 Right,Plantar Foot Performed By: Physician Duanne Guess, MD Post Procedure Diagnosis Same as Pre-procedure Electronic Signature(s) [name], Caidyn [name] ([id number]) [id number] Page 11 of 11 Signed: 02/23/2023 11:15:53 AM By: Duanne Guess MD FACS Signed: 02/23/2023 5:07:15 PM By: Karie Schwalbe RN Entered By: Karie Schwalbe on 02/23/2023 10:20:34 -------------------------------------------------------------------------------- SuperBill Details Patient Name: Date of Service: [name] Sabal [name]. 02/23/2023 Medical Record Number: [id number] Patient Account Number: [id number] Date of Birth/Sex: Treating RN: May 02, 1956 (67 y.o. M) Primary Care  Provider: Arva Chafe Other Clinician: Referring Provider: Treating Provider/Extender: [name] in Treatment: 25 Diagnosis Coding ICD-10 Codes Code Description [phone] Non-pressure chronic ulcer of other part of right foot with fat layer exposed I73.9 Peripheral vascular disease, unspecified I25.10 Atherosclerotic heart disease of native coronary artery without angina pectoris I10 Essential (primary) hypertension E11.65 Type 2 diabetes mellitus with hyperglycemia E11.40 Type 2 diabetes mellitus with diabetic neuropathy, unspecified I63.9 Cerebral infarction, unspecified Facility Procedures : CPT4 Code: 44034742 Description: Q4187 Epicord 2cm x 3cm - per sqcm Modifier: Quantity: 6 : CPT4 Code: [id number] Description: 15275 - SKIN SUB GRAFT FACE/NK/HF/G ICD-10 Diagnosis Description L97.512 Non-pressure chronic ulcer of other part of right foot with fat layer exposed Modifier: Quantity: 1 : CPT4 Code: [id number] Description: 88416 - APPLY TOTAL CONTACT LEG CAST ICD-10 Diagnosis Description L97.512 Non-pressure chronic ulcer of other part of right foot with fat layer exposed Modifier: Quantity: 1 Physician Procedures : CPT4 Code Description Modifier [id number] 99213 - WC PHYS LEVEL 3 - EST PT 25 ICD-10 Diagnosis Description L97.512 Non-pressure chronic ulcer of other part of right foot with fat layer exposed I73.9 Peripheral vascular disease, unspecified E11.40 Type 2  diabetes mellitus with diabetic neuropathy, unspecified E11.65 Type 2 diabetes mellitus with hyperglycemia Quantity: 1 : [id number] 15275 - WC PHYS SKIN SUB GRAFT FACE/NK/HF/G ICD-10 Diagnosis Description L97.512 Non-pressure chronic ulcer of other part of right foot with fat layer exposed Quantity: 1 : [id number] 29445 - WC PHYS APPLY TOTAL CONTACT CAST ICD-10 Diagnosis Description L97.512 Non-pressure chronic ulcer of other part of right foot with fat layer exposed Quantity: 1 Electronic  Signature(s) Signed: 02/23/2023 11:07:00 AM By: Duanne Guess MD FACS Entered By: Duanne Guess on 02/23/2023 11:07:00

## 2023-02-23 NOTE — Telephone Encounter (Signed)
See additional phone note dated 02/23/23. Patient has been rescheduled and updated instructions sent to his home address.

## 2023-02-23 NOTE — Progress Notes (Signed)
Bogus, Aikeem [name] ([id number]) [id number] Page 1 of 7 Visit Report for 02/23/2023 Arrival Information Details Patient Name: Date of Service: [name], [name]. 02/23/2023 10:00 [name] Medical Record Number: [id number] Patient Account Number: [id number] Date of Birth/Sex: Treating RN: 1955-09-05 (66 y.o. [name] Primary Care : Arva Chafe Other Clinician: Referring : Treating /Extender: [name] in Treatment: 25 Visit Information History Since Last Visit Added or deleted any medications: No Patient Arrived: Ambulatory Any new allergies or adverse reactions: No Arrival Time: 10:00 Had [name] fall or experienced change in No Accompanied By: self activities of daily living that may affect Transfer Assistance: None risk of falls: Patient Identification Verified: Yes Signs or symptoms of abuse/neglect since last visito No Patient Requires Transmission-Based Precautions: No Hospitalized since last visit: No Patient Has Alerts: No Implantable device outside of the clinic excluding No cellular tissue based products placed in the center since last visit: Has Dressing in Place as Prescribed: Yes Has Compression in Place as Prescribed: Yes Pain Present Now: Yes Electronic Signature(s) Signed: 02/23/2023 5:07:15 PM By: Karie Schwalbe RN Entered By: Karie Schwalbe on 02/23/2023 10:01:06 -------------------------------------------------------------------------------- Encounter Discharge Information Details Patient Name: Date of Service: [name] Pian, [name]. 02/23/2023 10:00 [name] Medical Record Number: [id number] Patient Account Number: [id number] Date of Birth/Sex: Treating RN: June 03, 1956 (67 y.o. [name] Primary Care : Arva Chafe Other Clinician: Referring : Treating /Extender: [name] in Treatment: 25 Encounter Discharge Information Items  Post Procedure Vitals Discharge Condition: Stable Temperature (F): 98.6 Ambulatory Status: Ambulatory Pulse (bpm): 53 Discharge Destination: Home Respiratory Rate (breaths/min): 18 Transportation: Private Auto Blood Pressure (mmHg): 160/82 Accompanied By: spouse Schedule Follow-up Appointment: Yes Clinical Summary of Care: Patient Declined Electronic Signature(s) Signed: 02/23/2023 5:07:15 PM By: Karie Schwalbe RN Entered By: Karie Schwalbe on 02/23/2023 17:05:54 [name], [name] ([id number]) [id number] Page 2 of 7 -------------------------------------------------------------------------------- Lower Extremity Assessment Details Patient Name: Date of Service: LEEB, [name]. 02/23/2023 10:00 [name] Medical Record Number: [id number] Patient Account Number: [id number] Date of Birth/Sex: Treating RN: Apr 18, 1956 (67 y.o. [name] Primary Care : Arva Chafe Other Clinician: Referring : Treating /Extender: [name] in Treatment: 25 Edema Assessment Assessed: [name]: No] [name]: No] Edema: [Left: Ye] [Right: s] Calf Left: Right: Point of Measurement: From Medial Instep 37 cm Ankle Left: Right: Point of Measurement: From Medial Instep 25.6 cm Vascular Assessment Pulses: Dorsalis Pedis Palpable: [Right:Yes] Extremity colors, hair growth, and conditions: Extremity Color: [Right:Normal] Hair Growth on Extremity: [Right:Yes] Temperature of Extremity: [Right:Warm] Capillary Refill: [Right:< 3 seconds] Dependent Rubor: [Right:No] Blanched when Elevated: [Right:No No] Toe Nail Assessment Left: Right: Thick: Yes Discolored: No Deformed: No Improper Length and Hygiene: No Electronic Signature(s) Signed: 02/23/2023 5:07:15 PM By: Karie Schwalbe RN Entered By: Karie Schwalbe on 02/23/2023 10:20:14 -------------------------------------------------------------------------------- Multi Wound  Chart Details Patient Name: Date of Service: [name] Pian, [name]. 02/23/2023 10:00 [name] Medical Record Number: [id number] Patient Account Number: [id number] Date of Birth/Sex: Treating RN: [birth year] (67 y.o. M) Primary Care : Arva Chafe Other Clinician: Referring : Treating /Extender: [name] in Treatment: 25 Vital Signs Height(in): 70 Pulse(bpm): 53 Weight(lbs): 260 Blood Pressure(mmHg): 160/82 [name], [name] ([id number]) [phone].pdf Page 3 of 7 Body Mass Index(BMI): 37.3 Temperature(F): 98.6 Respiratory Rate(breaths/min): 18 [2:Photos:] [[name]:[name]] Right, Plantar Foot [name] [name] Wound Location: Gradually Appeared [name] [name] Wounding Event: Diabetic Wound/Ulcer of the Lower [name] [name] Primary Etiology: Extremity Sleep Apnea, Arrhythmia, Coronary  [name] [name] Comorbid History: Artery Disease, Deep Vein Thrombosis, Hypertension, Myocardial Infarction, Peripheral Arterial Disease, Type II Diabetes, Neuropathy 07/08/2022 [name] [name] Date Acquired: 25 [name] [name] Weeks of Treatment: Open [name] [name] Wound Status: No [name] [name] Wound Recurrence: 1.1x1.2x0.1 [name] [name] Measurements L x W x D (cm) 1.037 [name] [name] [name] : rea 0.104 [name] [name] Volume (cm) : 59.40% [name] [name] % Reduction in [name] rea: 59.20% [name] [name] % Reduction in Volume: Grade 1 [name] [name] Classification: Medium [name] [name] Exudate [name] mount: Serosanguineous [name] [name] Exudate Type: red, brown [name] [name] Exudate Color: Distinct, outline attached [name] [name] Wound Margin: Large (67-100%) [name] [name] Granulation [name] mount: Red, Pink [name] [name] Granulation Quality: Small (1-33%) [name] [name] Necrotic [name] mount: Fat Layer (Subcutaneous Tissue): Yes [name] [name] Exposed Structures: Fascia: No Tendon: No Muscle: No Joint: No Bone: No Medium (34-66%) [name] [name] Epithelialization: Callus: Yes [name] [name] Periwound Skin Texture: Maceration: No [name] [name] Periwound Skin Moisture: No Abnormalities Noted  [name] [name] Periwound Skin Color: No Abnormality [name] [name] Temperature: Cellular or Tissue Based Product [name] [name] Procedures Performed: T Contact Cast otal Treatment Notes Electronic Signature(s) Signed: 02/23/2023 10:45:04 AM By: Duanne Guess MD FACS Entered By: Duanne Guess on 02/23/2023 10:45:04 -------------------------------------------------------------------------------- Multi-Disciplinary Care Plan Details Patient Name: Date of Service: [name] Pian, [name]. 02/23/2023 10:00 [name] Medical Record Number: [id number] Patient Account Number: [id number] Date of Birth/Sex: Treating RN: [birth year] (67 y.o. [name] Primary Care : Arva Chafe Other Clinician: Referring : Treating /Extender: [name] in Treatment: [address] [place] [name] ([id number]) [id number] Page 4 of 7 Multidisciplinary Care Plan reviewed with physician Active Inactive Abuse / Safety / Falls / Self Care Management Nursing Diagnoses: Impaired physical mobility Potential for falls Goals: Patient will not experience any injury related to falls Date Initiated: 08/31/2022 Target Resolution Date: 04/21/2023 Goal Status: Active Patient/caregiver will verbalize/demonstrate measures taken to improve the patient's personal safety Date Initiated: 08/31/2022 Target Resolution Date: 04/21/2023 Goal Status: Active Interventions: Provide education on basic hygiene Provide education on fall prevention Notes: Electronic Signature(s) Signed: 02/23/2023 5:07:15 PM By: Karie Schwalbe RN Entered By: Karie Schwalbe on 02/23/2023 17:03:25 -------------------------------------------------------------------------------- Pain Assessment Details Patient Name: Date of Service: [name] Sabal [name]. 02/23/2023 10:00 [name] Medical Record Number: [id number] Patient Account Number: [id number] Date of Birth/Sex: Treating RN: Nov 18, 1955 (67 y.o. [name] Primary Care : Arva Chafe Other Clinician: Referring : Treating /Extender: [name] in Treatment: 25 Active Problems Location of Pain Severity and Description of Pain Patient Has Paino No Site Locations Pain Management and Medication Current Pain Management: Electronic Signature(s) [name], [name] ([id number]) [phone].pdf Page 5 of 7 Signed: 02/23/2023 5:07:15 PM By: Karie Schwalbe RN Entered By: Karie Schwalbe on 02/23/2023 10:18:41 -------------------------------------------------------------------------------- Patient/Caregiver Education Details Patient Name: Date of Service: Swearengin, [name]. 8/6/2024andnbsp10:00 [name] Medical Record Number: [id number] Patient Account Number: [id number] Date of Birth/Gender: Treating RN: Dec 02, 1955 (67 y.o. [name] Primary Care Physician: Arva Chafe Other Clinician: Referring Physician: Treating Physician/Extender: [name] in Treatment: 25 Education Assessment Education Provided To: Patient Education Topics Provided Wound/Skin Impairment: Methods: Explain/Verbal Responses: Return demonstration correctly Electronic Signature(s) Signed: 02/23/2023 5:07:15 PM By: Karie Schwalbe RN Entered By: Karie Schwalbe on 02/23/2023 17:04:14 -------------------------------------------------------------------------------- Wound Assessment Details Patient Name: Date of Service: [name] Pian, [name]. 02/23/2023 10:00 [name] Medical Record Number: [id number] Patient Account Number: [id number] Date of Birth/Sex: Treating RN: 04-13-1956 (67 y.o. M) [name],  [name] Primary Care : Arva Chafe Other Clinician: Referring : Treating /Extender: [name] in Treatment: 25 Wound Status Wound Number: 2 Primary Diabetic Wound/Ulcer of the Lower Extremity Etiology: Wound  Location: Right, Plantar Foot Wound Open Wounding Event: Gradually Appeared Status: Date Acquired: 07/08/2022 Comorbid Sleep Apnea, Arrhythmia, Coronary Artery Disease, Deep Vein Weeks Of Treatment: 25 History: Thrombosis, Hypertension, Myocardial Infarction, Peripheral Arterial Clustered Wound: No Disease, Type II Diabetes, Neuropathy Photos [name], [name] ([id number]) [id number].pdf Page 6 of 7 Wound Measurements Length: (cm) 1.1 Width: (cm) 1.2 Depth: (cm) 0.1 Area: (cm) 1.037 Volume: (cm) 0.104 % Reduction in Area: 59.4% % Reduction in Volume: 59.2% Epithelialization: Medium (34-66%) Tunneling: No Undermining: No Wound Description Classification: Grade 1 Wound Margin: Distinct, outline attached Exudate Amount: Medium Exudate Type: Serosanguineous Exudate Color: red, brown Foul Odor After Cleansing: No Slough/Fibrino Yes Wound Bed Granulation Amount: Large (67-100%) Exposed Structure Granulation Quality: Red, Pink Fascia Exposed: No Necrotic Amount: Small (1-33%) Fat Layer (Subcutaneous Tissue) Exposed: Yes Necrotic Quality: Adherent Slough Tendon Exposed: No Muscle Exposed: No Joint Exposed: No Bone Exposed: No Periwound Skin Texture Texture Color No Abnormalities Noted: No No Abnormalities Noted: Yes Callus: Yes Temperature / Pain Temperature: No Abnormality Moisture No Abnormalities Noted: No Maceration: No Treatment Notes Wound #2 (Foot) Wound Laterality: Plantar, Right Cleanser Soap and Water Discharge Instruction: May shower and wash wound with dial antibacterial soap and water prior to dressing change. Wound Cleanser Discharge Instruction: Cleanse the wound with wound cleanser prior to applying [name] clean dressing using gauze sponges, not tissue or cotton balls. Peri-Wound Care Zinc Oxide Ointment 30g tube Discharge Instruction: Apply Zinc Oxide to periwound with each dressing change Topical Skintegrity Hydrogel 4 (oz) Discharge  Instruction: Apply hydrogel as directed Primary Dressing ADAPTIC TOUCH 3x4.25 (in/in) Discharge Instruction: Apply to wound bed as instructed EPICORD Discharge Instruction: #6 (02/23/23) TCC #3 Secondary Dressing Woven Gauze Sponge, Non-Sterile 4x4 in Discharge Instruction: Apply over primary dressing as directed. [name], [name] ([id number]) [id number] Page 7 of 7 Zetuvit Plus 4x8 in Discharge Instruction: Apply over primary dressing as directed. Secured With [employer], 4.5x3.1 (in/yd) Discharge Instruction: Secure with Kerlix as directed. Compression Wrap Compression Stockings Add-Ons Electronic Signature(s) Signed: 02/23/2023 5:07:15 PM By: Karie Schwalbe RN Entered By: Karie Schwalbe on 02/23/2023 10:16:02 -------------------------------------------------------------------------------- Vitals Details Patient Name: Date of Service: [name] Pian, [name]. 02/23/2023 10:00 [name] Medical Record Number: [id number] Patient Account Number: [id number] Date of Birth/Sex: Treating RN: Jun 24, 1956 (67 y.o. [name] Primary Care : Arva Chafe Other Clinician: Referring : Treating /Extender: [name] in Treatment: 25 Vital Signs Time Taken: 10:18 Temperature (F): 98.6 Height (in): 70 Pulse (bpm): 53 Weight (lbs): 260 Respiratory Rate (breaths/min): 18 Body Mass Index (BMI): 37.3 Blood Pressure (mmHg): 160/82 Reference Range: 80 - 120 mg / dl Electronic Signature(s) Signed: 02/23/2023 5:07:15 PM By: Karie Schwalbe RN Entered By: Karie Schwalbe on 02/23/2023 10:25:24

## 2023-02-23 NOTE — Telephone Encounter (Signed)
Error

## 2023-02-23 NOTE — Telephone Encounter (Signed)
That is unfortunate and disappointing since his procedure for this week needs to be canceled, which will leave a wasted hospital procedure slot.  I will offer one chance to reschedule with correct pre-procedure holding of meds as originally planned.  I highly recommend he write this all down on a calendar to ensure the correct meds are stopped on the correct days.  This is a polyp surveillance colonoscopy and can go in my next available hospital slot.  03/30/23 is OK with me if scheduling will accommodate.  Resume his plavix and Eliquis today.  - HD

## 2023-02-23 NOTE — Telephone Encounter (Signed)
Dr Myrtie Neither, preop just sent a note letting us know that in preparation for 02/25/23 colonoscopy at [facility], patient held Eliquis x 5 days and has only held Plavix x 1 day (rather than plavix hold x 5 days and eliquis hold x 1 day). I assume this procedure would now need to be rescheduled. Looks like you may be able to do his procedure at 1115 am 03/30/23 if this works for you.[name] KitchenMarland Kitchen

## 2023-02-23 NOTE — Telephone Encounter (Signed)
Inbound call from patient stating he has misplaced instructions paperwork for 8/8 colonoscopy at [facility]. Requesting a call back to go over prep instructions. Please advise, thank you.

## 2023-02-23 NOTE — Telephone Encounter (Signed)
I have spoken to the patient to advise that unfortunately, we will have to move his hospital colonoscopy procedure date due to the mix up in holding anticoagulation. Patient is advised that his procedure has been moved to 04/22/23 at 830 am, 700 am arrival (procedure instructions will remain completely the same with the exception of date change from 8/8 to 10/3).   Patient is advised that he should restart both his Eliquis and Plavix today since procedure has now been moved to October. Explained to patient that he should begin holding plavix 5 days prior to his October 3 appointment and should hold eliquis 1 day prior. Patient has read this information back to me and says he has this written down. In addition, explained that he should hold ozempic as directed prior to his appointment. I asked the patient about placing instructions on MyChart. Patient states he can "never get in" and states he no longer tries to get this to work. Patient instead requests that updated instructions be mailed to his home address. These instructions are made available in mychart as well in the case that he is able to access his mychart account in the future.

## 2023-03-03 ENCOUNTER — Encounter (HOSPITAL_BASED_OUTPATIENT_CLINIC_OR_DEPARTMENT_OTHER): Payer: Medicare HMO | Admitting: General Surgery

## 2023-03-03 DIAGNOSIS — Z8673 Personal history of transient ischemic attack (TIA), and cerebral infarction without residual deficits: Secondary | ICD-10-CM | POA: Diagnosis not present

## 2023-03-03 DIAGNOSIS — E11621 Type 2 diabetes mellitus with foot ulcer: Secondary | ICD-10-CM | POA: Diagnosis not present

## 2023-03-03 DIAGNOSIS — Z951 Presence of aortocoronary bypass graft: Secondary | ICD-10-CM | POA: Diagnosis not present

## 2023-03-03 DIAGNOSIS — Z85828 Personal history of other malignant neoplasm of skin: Secondary | ICD-10-CM | POA: Diagnosis not present

## 2023-03-03 DIAGNOSIS — G4733 Obstructive sleep apnea (adult) (pediatric): Secondary | ICD-10-CM | POA: Diagnosis not present

## 2023-03-03 DIAGNOSIS — L97512 Non-pressure chronic ulcer of other part of right foot with fat layer exposed: Secondary | ICD-10-CM | POA: Diagnosis not present

## 2023-03-03 DIAGNOSIS — E1151 Type 2 diabetes mellitus with diabetic peripheral angiopathy without gangrene: Secondary | ICD-10-CM | POA: Diagnosis not present

## 2023-03-03 DIAGNOSIS — I2510 Atherosclerotic heart disease of native coronary artery without angina pectoris: Secondary | ICD-10-CM | POA: Diagnosis not present

## 2023-03-03 DIAGNOSIS — I252 Old myocardial infarction: Secondary | ICD-10-CM | POA: Diagnosis not present

## 2023-03-03 DIAGNOSIS — I4891 Unspecified atrial fibrillation: Secondary | ICD-10-CM | POA: Diagnosis not present

## 2023-03-04 NOTE — Progress Notes (Signed)
[name] [name], [name] [name] ([id number]) [id number] Page 1 of 12 Visit Report for 03/03/2023 Chief Complaint Document Details Patient Name: Date of Service: [name] [name], [place] [name] [name]. 03/03/2023 9:15 [name] Medical Record Number: [id number] Patient Account Number: [id number] Date of Birth/Sex: Treating RN: Apr 01, 1956 (67 y.o. M) Primary Care Provider: Arva Chafe Other Clinician: Referring Provider: Treating Provider/Extender: [name] in Treatment: 26 Information Obtained from: Patient Chief Complaint 05/14/2020; patient is here for review of an abrasion injury on the left lateral calf 08/31/2022: [name] (1st metatarsal base, plantar) Electronic Signature(s) Signed: 03/03/2023 9:38:01 AM By: Duanne Guess MD FACS Entered By: Duanne Guess on 03/03/2023 09:38:01 -------------------------------------------------------------------------------- Cellular or Tissue Based Product Details Patient Name: Date of Service: [name] [name] [name] [name]. 03/03/2023 9:15 [name] Medical Record Number: [id number] Patient Account Number: [id number] Date of Birth/Sex: Treating RN: 1956/03/04 (67 y.o. [name] [name] Primary Care Provider: Arva Chafe Other Clinician: Referring Provider: Treating Provider/Extender: [name] in Treatment: 26 Cellular or Tissue Based Product Type Wound #2 Right,Plantar Foot Applied to: Performed By: Physician Duanne Guess, MD Cellular or Tissue Based Product Type: Epicord Level of Consciousness (Pre-procedure): Awake and Alert Pre-procedure Verification/Time Out Yes - 09:28 Taken: Location: genitalia / hands / feet / multiple digits Wound Size (sq cm): 0.49 Product Size (sq cm): 6 Waste Size (sq cm): 3 Waste Reason: wound size Amount of Product Applied (sq cm): 3 Instrument Used: Forceps, Scissors Lot #: [id number] Expiration Date: 09/18/2027 Fenestrated: No Reconstituted:  Yes Solution Type: normal saline Solution Amount: 5 ml Lot #: [id number] KS Solution Expiration Date: 08/10/2024 Secured: Yes Secured With: Steri-Strips Dressing Applied: Yes Primary Dressing: adaptic Procedural Pain: 0 Post Procedural Pain: 0 [name] [name] [name] ([id number]) [phone].pdf Page 2 of 12 Response to Treatment: Procedure was tolerated well Level of Consciousness (Post- Awake and Alert procedure): Post Procedure Diagnosis Same as Pre-procedure Notes scribed for Dr. Lady Gary by Samuella Bruin, RN Electronic Signature(s) Signed: 03/03/2023 2:33:10 PM By: Duanne Guess MD FACS Signed: 03/03/2023 4:38:35 PM By: Samuella Bruin Entered By: Samuella Bruin on 03/03/2023 09:29:47 -------------------------------------------------------------------------------- Debridement Details Patient Name: Date of Service: [name] [name], [name] [name] [name]. 03/03/2023 9:15 [name] Medical Record Number: [id number] Patient Account Number: [id number] Date of Birth/Sex: Treating RN: 09-Sep-1955 (67 y.o. [name] [name] Primary Care Provider: Arva Chafe Other Clinician: Referring Provider: Treating Provider/Extender: [name] in Treatment: 26 Debridement Performed for Assessment: Wound #2 Right,Plantar Foot Performed By: Physician Duanne Guess, MD Debridement Type: Debridement Severity of Tissue Pre Debridement: Fat layer exposed Level of Consciousness (Pre-procedure): Awake and Alert Pre-procedure Verification/Time Out Yes - 09:25 Taken: Start Time: 09:25 Pain Control: Lidocaine 4% Topical Solution Percent of Wound Bed Debrided: 100% T Area Debrided (cm): otal 0.38 Tissue and other material debrided: Non-Viable, Callus, Slough, Slough Level: Non-Viable Tissue Debridement Description: Selective/Open Wound Instrument: Curette Bleeding: Minimum Hemostasis Achieved: Pressure Response to Treatment: Procedure was tolerated well Level of  Consciousness (Post- Awake and Alert procedure): Post Debridement Measurements of Total Wound Length: (cm) 0.7 Width: (cm) 0.7 Depth: (cm) 0.1 Volume: (cm) 0.038 Character of Wound/Ulcer Post Debridement: Improved Severity of Tissue Post Debridement: Fat layer exposed Post Procedure Diagnosis Same as Pre-procedure Notes scribed for Dr. Lady Gary by Samuella Bruin, RN Electronic Signature(s) Signed: 03/03/2023 2:33:10 PM By: Duanne Guess MD FACS Signed: 03/03/2023 4:38:35 PM By: Samuella Bruin Entered By: Samuella Bruin on 03/03/2023 09:28:13 [name] [name], [name] [name] ([id number]) [id number] Page 3 of 12 -------------------------------------------------------------------------------- HPI Details Patient Name: Date of Service: [name],  [name] [name] [name]. 03/03/2023 9:15 [name] Medical Record Number: [id number] Patient Account Number: [id number] Date of Birth/Sex: Treating RN: [birth year] (67 y.o. M) Primary Care Provider: Arva Chafe Other Clinician: Referring Provider: Treating Provider/Extender: [name] in Treatment: 26 History of Present Illness HPI Description: ADMISSION 05/14/2020; this is [name] with multiple medical issues. Predominantly he has type 2 diabetes with [name] and also history of fairly significant PAD. He had [name] in February 2017 he also had an atherectomy and angioplasty by Dr. [name] of the right popliteal artery in 2016. He is supposed to be getting arterial studies annually however this was interrupted last year because of the pandemic. He tells [place] he was at [facility] 2 weeks ago was getting out of of the scooter and traumatized his left lateral lower leg. There was [name] as the patient is on Plavix and Eliquis. They have been dressing this with Neosporin and doing [name] fairly good job. Wound measures 2.5 x 3.5  it does not have any depth he does not have [name] in his legs outside of surgery however he does have chronic edema and skin changes suggestive of chronic venous disease possibly some degree of lymphedema as well. Past medical history includes type 2 diabetes with peripheral neuropathy and gait instability, lumbar spondylosis, obstructive sleep apnea, history of [name], basal cell skin cancer, atrial fibrillation on anticoagulation, significant PAD as noted with [name] and [name] in 30th 2016. He also has [name] of coronary artery disease with an MI in 2002 hypertension hyperlipidemia and heart failure with preserved ejection fraction His last arterial studies I can see in epic were on 03/10/2018 this showed [name] and [name] with monophasic waveforms on the right. On the left his ABI was 1.20 with [name] and triphasic waveforms. He has not had arterial studies since. Our nurse in the clinic got an ABI on the left of 1.1 11/2; left anterior leg wound in the setting of chronic venous insufficiency. Wound was initially trauma. We have been using Hydrofera Blue under compression he has home health.. The wound looks [name] lot better today with improvement in surface area 11/16; left anterior leg wound in the setting of chronic venous insufficiency. Wound was initially trauma. We have been using Hydrofera Blue under compression. The patient is closed today. He is supposed to follow-up with vein and vascular with regards to arterial insufficiency nevertheless his leg wounds are 12/3; apparently 2 weeks ago when they were putting on their stockings they managed to get 3 wounds on the left anterior lower leg from abrasion when putting on the stockings. Home health came by the week of Thanksgiving and put Hydrofera Blue 4-layer wrap on this and  there is only one superficial area remaining. The patient and his wife complained about the difficulties getting stockings on I think we are using 20/30. We will order bilateral external compression stockings which should be easier. 12/10; wound on the left anterior lower leg is closed. He has chronic venous insufficiency we ordered him Farrow wrap stockings unfortunately he did not bring these in. 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 He says that it  has been present since mid December. He is currently residing in [facility] until March and [name] podiatrist has been looking after him there. They have been simply painting the area with Betadine. He has not had any lower extremity arterial studies since 2019, at which time his right ABI was 0.69. Measured in clinic today, it was 0.71. He is not aware of his most recent hemoglobin A1c, but historically he has had exceptionally poor control. On the basis of his right first metatarsal, there is [name] small crescent shaped wound. There is surrounding eschar and callus. There is no malodor or purulent drainage. 09/08/2022: The original wound is smaller today and fairly clean, but there is some discoloration and [name] pulpy texture to the adjacent callus. Underneath this, the tissue is open exposing the fat layer. It looks like perhaps there was [name] crack in the callus and moisture got under the skin and caused breakdown. 09/15/2022: There has been more moisture related tissue breakdown. The callus is very soft and the underlying tissue is more open. 09/28/2022: The wound looks much better. He has done [name] good job keeping it dry. There is some callus overlying much of the wound surface. There is slough on the exposed open areas. 10/14/2022: There has been [name] lot of moisture related tissue breakdown. He is still forming callus over the top and then it seems that moisture gets underneath the callus and  causes tissue damage. There is slough on the exposed wound surface. [place]. 10/21/2022: His foot is less wet, but there is no significant change to his wound. He has developed [name] blister on his left anterior tibial surface. There is no open wound here, but he does have some fairly significant edema. We are planning to apply [name] total contact cast today. 10/23/2022: Here for his obligatory first cast change. He says he has not had any issues wearing the cast or walking in the boot. No detrimental effects on his wound. 10/29/2022: The wound is looking better. Clearly, putting him in the cast has prevented water from getting in under his callus and causing further tissue breakdown. The blister on his left anterior tibial surface has not yet ruptured. Edema control is significantly improved. 11/05/2022: The wound on his right plantar foot is getting better. There is still some callus accumulation around the wounds, but there are just 2 open areas now, [name] very small 1 on the medial aspect of his metatarsal head and [name] little bit larger 1 on the plantar surface. The blister on his left anterior tibial surface is now open with hanging dry skin. 11/12/2022: The left anterior tibial wound is closed. The wound on his right plantar foot is smaller with just [name] little bit of callus around the edges. 11/19/2022: The right plantar foot wound continues to contract. The surface is quite clean. The tiny satellite area on the medial aspect of his foot has closed. [name] [name], [name] [name] ([id number]) [id number] Page 4 of 12 11/26/2022: The wound is smaller again today. He is responding well to the offloading effects of total contact casting. 12/03/2022: The wound is about the same size today. There is [name] little bit of moisture around the perimeter of the wound. No significant tissue breakdown. 12/10/2022: The wound is smaller today. There has been no  moisture-related tissue breakdown of any maceration. 12/21/2022: The wound continues to contract. Moisture control is excellent. 12/28/2022: The wound is [name] little bit smaller today.  01/05/2023: The wound measures smaller today. Excellent moisture control with zero evidence of maceration. The wound surface is clean with healthy granulation tissue. 01/13/2023: We are leaving his Apligraf in situ for [name] second week. There is no discolored drainage or malodor coming from the site. 01/19/2023: The wound is smaller today. The periwound skin is in good condition without tissue maceration and the healed areas of tissue appear to have improved integrity. 01/27/2023: Continued contraction of the wound. No tissue maceration. The wound surface is flush with the surrounding skin. 02/02/2023: The wound measured [name] little bit smaller today. There is some bruising just adjacent to the wound, but this has not resulted in any tissue breakdown. 02/09/2023: The wound is smaller again today. The area of bruising that was seen last week has resolved and there has been no tissue damage as [name] result. There is some callus accumulation around the wound edges along with minimal soft slough on the surface. 02/16/2023: The wound measured [name] little bit larger today. There is [name] little bit of maceration around the wound edges, which is likely responsible. 02/23/2023: The wound is smaller today and there is epithelium encroaching around the edges. There is no periwound tissue maceration, nor has he accumulated any significant callus. 03/03/2023: His wound continues to contract. There is no maceration nor any significant callus. There is slight slough on the wound surface. Electronic Signature(s) Signed: 03/03/2023 9:38:38 AM By: Duanne Guess MD FACS Entered By: Duanne Guess on 03/03/2023 09:38:38 -------------------------------------------------------------------------------- Physical Exam Details Patient Name: Date of Service: [name] [name], [name]  [name] [name]. 03/03/2023 9:15 [name] Medical Record Number: [id number] Patient Account Number: [id number] Date of Birth/Sex: Treating RN: [birth year] (67 y.o. M) Primary Care Provider: Arva Chafe Other Clinician: Referring Provider: Treating Provider/Extender: [name] in Treatment: 26 Constitutional Hypertensive, asymptomatic. . . . no acute distress. Respiratory Normal work of breathing on room air. Notes 03/03/2023: His wound continues to contract. There is no maceration nor any significant callus. There is slight slough on the wound surface. Electronic Signature(s) Signed: 03/03/2023 9:39:11 AM By: Duanne Guess MD FACS Entered By: Duanne Guess on 03/03/2023 09:39:10 -------------------------------------------------------------------------------- Physician Orders Details Patient Name: Date of Service: [name] [name], [name] [name] [name]. 03/03/2023 9:15 [name] Medical Record Number: [id number] Patient Account Number: [id number] Date of Birth/Sex: Treating RN: May 06, 1956 (67 y.o. Lenise Herald, Dorann Lodge, Tayven [name] ([id number]) [phone].pdf Page 5 of 12 Primary Care Provider: Arva Chafe Other Clinician: Referring Provider: Treating Provider/Extender: [name] in Treatment: 26 Verbal / Phone Orders: No Diagnosis Coding ICD-10 Coding Code Description L97.512 Non-pressure chronic ulcer of other part of right foot with fat layer exposed I73.9 Peripheral vascular disease, unspecified I25.10 Atherosclerotic heart disease of native coronary artery without angina pectoris I10 Essential (primary) hypertension E11.65 Type 2 diabetes mellitus with hyperglycemia E11.40 Type 2 diabetes mellitus with diabetic neuropathy, unspecified I63.9 Cerebral infarction, unspecified Follow-up Appointments ppointment in 1 week. - Tuesday 03/09/23 at 9:15am Return [name] Anesthetic (In clinic) Topical Lidocaine 4% applied to wound  bed Cellular or Tissue Based Products Wound #2 Right,Plantar Foot Cellular or Tissue Based Product Type: - Epicord #7 Cellular or Tissue Based Product applied to wound bed, secured with steri-strips, cover with Adaptic or Mepitel. (DO NOT REMOVE). Bathing/ Shower/ Hygiene May shower with protection but do not get wound dressing(s) wet. Protect dressing(s) with water repellant cover (for example, large plastic bag) or [name] cast cover and may then take shower. Edema Control - Lymphedema / SCD / Other Elevate  legs to the level of the heart or above for 30 minutes daily and/or when sitting for 3-4 times [name] day throughout the day. Avoid standing for long periods of time. Patient to wear own compression stockings every day. Moisturize legs daily. Off-Loading Total Contact Cast to Right Lower Extremity - when available Open toe surgical shoe to: - with peg assist insert to right foot Wound Treatment Wound #2 - Foot Wound Laterality: Plantar, Right Cleanser: Soap and Water 1 x Per Week/30 Days Discharge Instructions: May shower and wash wound with dial antibacterial soap and water prior to dressing change. Cleanser: Wound Cleanser 1 x Per Week/30 Days Discharge Instructions: Cleanse the wound with wound cleanser prior to applying [name] clean dressing using gauze sponges, not tissue or cotton balls. Peri-Wound Care: Zinc Oxide Ointment 30g tube 1 x Per Week/30 Days Discharge Instructions: Apply Zinc Oxide to periwound with each dressing change Topical: Skintegrity Hydrogel 4 (oz) 1 x Per Week/30 Days Discharge Instructions: Apply hydrogel as directed Prim Dressing: ADAPTIC TOUCH 3x4.25 (in/in) 1 x Per Week/30 Days ary Discharge Instructions: Apply to wound bed as instructed Prim Dressing: EPICORD 1 x Per Week/30 Days ary Discharge Instructions: #6 (02/23/23) Secondary Dressing: Woven Gauze Sponge, Non-Sterile 4x4 in 1 x Per Week/30 Days Discharge Instructions: Apply over primary dressing as  directed. Secondary Dressing: Zetuvit Plus 4x8 in 1 x Per Week/30 Days Discharge Instructions: Apply over primary dressing as directed. Secured With: Elastic Bandage 4 inch (ACE bandage) 1 x Per Week/30 Days Discharge Instructions: Secure with ACE bandage as directed. Secured With: [employer], 4.5x3.1 (in/yd) 1 x Per Week/30 Days Discharge Instructions: Secure with Kerlix as directed. [name] [name], [name] [name] ([id number]) [id number] Page 6 of 12 Patient Medications llergies: No Known Drug Allergies [name] Notifications Medication Indication Start End 03/03/2023 lidocaine DOSE topical 4 % cream - cream topical Electronic Signature(s) Signed: 03/03/2023 2:33:10 PM By: Duanne Guess MD FACS Entered By: Duanne Guess on 03/03/2023 09:39:34 -------------------------------------------------------------------------------- Problem List Details Patient Name: Date of Service: [name] [name], [name] [name] [name]. 03/03/2023 9:15 [name] Medical Record Number: [id number] Patient Account Number: [id number] Date of Birth/Sex: Treating RN: 08/08/1955 (67 y.o. M) Primary Care Provider: Arva Chafe Other Clinician: Referring Provider: Treating Provider/Extender: [name] in Treatment: 26 Active Problems ICD-10 Encounter Code Description Active Date MDM Diagnosis L97.512 Non-pressure chronic ulcer of other part of right foot with fat layer exposed 08/31/2022 No Yes I73.9 Peripheral vascular disease, unspecified 08/31/2022 No Yes I25.10 Atherosclerotic heart disease of native coronary artery without angina pectoris 08/31/2022 No Yes I10 Essential (primary) hypertension 08/31/2022 No Yes E11.65 Type 2 diabetes mellitus with hyperglycemia 08/31/2022 No Yes E11.40 Type 2 diabetes mellitus with diabetic neuropathy, unspecified 08/31/2022 No Yes I63.9 Cerebral infarction, unspecified 08/31/2022 No Yes Inactive Problems Resolved Problems ICD-10 Code Description Active  Date Resolved Date L97.821 Non-pressure chronic ulcer of other part of left lower leg limited to breakdown of skin 11/05/2022 11/05/2022 [name] [name], [name] [name] ([id number]) [phone].pdf Page 7 of 12 Electronic Signature(s) Signed: 03/03/2023 9:37:44 AM By: Duanne Guess MD FACS Entered By: Duanne Guess on 03/03/2023 09:37:44 -------------------------------------------------------------------------------- Progress Note Details Patient Name: Date of Service: [name] [name], [name] [name] [name]. 03/03/2023 9:15 [name] Medical Record Number: [id number] Patient Account Number: [id number] Date of Birth/Sex: Treating RN: August 11, 1955 (67 y.o. M) Primary Care Provider: Arva Chafe Other Clinician: Referring Provider: Treating Provider/Extender: [name] in Treatment: 26 Subjective Chief Complaint Information obtained from Patient 05/14/2020; patient is here for review of an abrasion injury  on the left lateral calf 08/31/2022: [name] (1st metatarsal base, plantar) History of Present Illness (HPI) ADMISSION 05/14/2020; this is [name] with multiple medical issues. Predominantly he has type 2 diabetes with [name] and also history of fairly significant PAD. He had [name] in February 2017 he also had an atherectomy and angioplasty by Dr. [name] of the right popliteal artery in 2016. He is supposed to be getting arterial studies annually however this was interrupted last year because of the pandemic. He tells [place] he was at [facility] 2 weeks ago was getting out of of the scooter and traumatized his left lateral lower leg. There was [name] as the patient is on Plavix and Eliquis. They have been dressing this with Neosporin and doing [name] fairly good job. Wound measures 2.5 x 3.5 it does not have any depth he does not have [name] in his legs outside of surgery  however he does have chronic edema and skin changes suggestive of chronic venous disease possibly some degree of lymphedema as well. Past medical history includes type 2 diabetes with peripheral neuropathy and gait instability, lumbar spondylosis, obstructive sleep apnea, history of [name], basal cell skin cancer, atrial fibrillation on anticoagulation, significant PAD as noted with [name] and [name] in 30th 2016. He also has [name] of coronary artery disease with an MI in 2002 hypertension hyperlipidemia and heart failure with preserved ejection fraction His last arterial studies I can see in epic were on 03/10/2018 this showed [name] and [name] with monophasic waveforms on the right. On the left his ABI was 1.20 with [name] and triphasic waveforms. He has not had arterial studies since. Our nurse in the clinic got an ABI on the left of 1.1 11/2; left anterior leg wound in the setting of chronic venous insufficiency. Wound was initially trauma. We have been using Hydrofera Blue under compression he has home health.. The wound looks [name] lot better today with improvement in surface area 11/16; left anterior leg wound in the setting of chronic venous insufficiency. Wound was initially trauma. We have been using Hydrofera Blue under compression. The patient is closed today. He is supposed to follow-up with vein and vascular with regards to arterial insufficiency nevertheless his leg wounds are 12/3; apparently 2 weeks ago when they were putting on their stockings they managed to get 3 wounds on the left anterior lower leg from abrasion when putting on the stockings. Home health came by the week of Thanksgiving and put Hydrofera Blue 4-layer wrap on this and there is only one superficial area remaining. The patient and his wife complained about the  difficulties getting stockings on I think we are using 20/30. We will order bilateral external compression stockings which should be easier. 12/10; wound on the left anterior lower leg is closed. He has chronic venous insufficiency we ordered him Farrow wrap stockings unfortunately he did not bring these in. 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 He says that it has been present since mid December. He is currently residing in [facility] until March and [name] podiatrist has been looking after him there. They have been simply painting the area  with Betadine. He has not had any lower extremity arterial studies since 2019, at which time his right ABI was 0.69. Measured in clinic today, it was 0.71. He is not aware of his most recent hemoglobin A1c, but historically he has had exceptionally poor control. On the basis of his right first metatarsal, there is [name] small crescent shaped wound. There is surrounding eschar and callus. There is no malodor or purulent drainage. 09/08/2022: The original wound is smaller today and fairly clean, but there is some discoloration and [name] pulpy texture to the adjacent callus. Underneath this, the tissue is open exposing the fat layer. It looks like perhaps there was [name] crack in the callus and moisture got under the skin and caused breakdown. 09/15/2022: There has been more moisture related tissue breakdown. The callus is very soft and the underlying tissue is more open. 09/28/2022: The wound looks much better. He has done [name] good job keeping it dry. There is some callus overlying much of the wound surface. There is slough on the exposed open areas. 10/14/2022: There has been [name] lot of moisture related tissue breakdown. He is still forming callus over the top and then it seems that moisture gets underneath the callus and causes tissue damage. There is slough on the exposed wound surface. [place]  [place] 10/21/2022: His foot is less wet, but there is no significant change to his wound. He has developed [name] blister on his left anterior tibial surface. There is no open wound here, but he does have some fairly significant edema. We are planning to apply [name] total contact cast today. 10/23/2022: Here for his obligatory first cast change. He says he has not had any issues wearing the cast or walking in the boot. No detrimental effects on his [place], Saquan [name] ([id number]) [phone].pdf Page 8 of 12 wound. 10/29/2022: The wound is looking better. Clearly, putting him in the cast has prevented water from getting in under his callus and causing further tissue breakdown. The blister on his left anterior tibial surface has not yet ruptured. Edema control is significantly improved. 11/05/2022: The wound on his right plantar foot is getting better. There is still some callus accumulation around the wounds, but there are just 2 open areas now, [name] very small 1 on the medial aspect of his metatarsal head and [name] little bit larger 1 on the plantar surface. The blister on his left anterior tibial surface is now open with hanging dry skin. 11/12/2022: The left anterior tibial wound is closed. The wound on his right plantar foot is smaller with just [name] little bit of callus around the edges. 11/19/2022: The right plantar foot wound continues to contract. The surface is quite clean. The tiny satellite area on the medial aspect of his foot has closed. 11/26/2022: The wound is smaller again today. He is responding well to the offloading effects of total contact casting. 12/03/2022: The wound is about the same size today. There is [name] little bit of moisture around the perimeter of the wound. No significant tissue breakdown. 12/10/2022: The wound is smaller today. There has been no moisture-related tissue breakdown of any maceration. 12/21/2022: The wound continues to contract.  Moisture control is excellent. 12/28/2022: The wound is [name] little bit smaller today. 01/05/2023: The wound measures smaller today. Excellent moisture control with zero evidence of maceration. The wound surface is clean with healthy granulation tissue. 01/13/2023: We are leaving his Apligraf in situ  for [name] second week. There is no discolored drainage or malodor coming from the site. 01/19/2023: The wound is smaller today. The periwound skin is in good condition without tissue maceration and the healed areas of tissue appear to have improved integrity. 01/27/2023: Continued contraction of the wound. No tissue maceration. The wound surface is flush with the surrounding skin. 02/02/2023: The wound measured [name] little bit smaller today. There is some bruising just adjacent to the wound, but this has not resulted in any tissue breakdown. 02/09/2023: The wound is smaller again today. The area of bruising that was seen last week has resolved and there has been no tissue damage as [name] result. There is some callus accumulation around the wound edges along with minimal soft slough on the surface. 02/16/2023: The wound measured [name] little bit larger today. There is [name] little bit of maceration around the wound edges, which is likely responsible. 02/23/2023: The wound is smaller today and there is epithelium encroaching around the edges. There is no periwound tissue maceration, nor has he accumulated any significant callus. 03/03/2023: His wound continues to contract. There is no maceration nor any significant callus. There is slight slough on the wound surface. Patient History Information obtained from Patient. Family History Unknown History. Social History Never smoker, Marital Status - Single, Alcohol Use - Never, Drug Use - No History, Caffeine Use - Rarely. Medical History Respiratory Patient has history of Sleep Apnea Cardiovascular Patient has history of Arrhythmia - [name]-Fib, Coronary Artery Disease - s/p CABG, Deep Vein  Thrombosis, Hypertension, Myocardial Infarction, Peripheral Arterial Disease - s/p Fempop x 2 Endocrine Patient has history of Type II Diabetes Neurologic Patient has history of Neuropathy Hospitalization/Surgery History - colonoscopy. - polypectomy. - peripheral vascular cath. - shoulder arthroscopy. - carpal tunnel release. - coronary artery bypass. - appendectomy. - cardiac cath. - coronary angioplasty. - thrombectomy. - knee arthroplasty. - popliteal artery stent. 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 no acute distress. Vitals Time Taken: 9:08 AM, Height: 70 in, Weight: 260 lbs, BMI: 37.3, Temperature: 97.9 F, Pulse: 63 bpm, Respiratory Rate: 18 breaths/min, Blood Pressure: 170/73 mmHg. Respiratory Normal work of breathing on room air. General Notes: 03/03/2023: His wound continues to contract. There is no maceration nor any significant callus. There is slight slough on the wound surface. Integumentary (Hair, Skin) Wound #2 status is Open. Original cause of wound was Gradually Appeared. The date acquired was: 07/08/2022. The wound has been in treatment 26 weeks. The wound is located on the Right,Plantar Foot. The wound measures 0.7cm length x 0.7cm width x 0.1cm depth; 0.385cm^2 area and 0.038cm^3 volume. There is Fat Layer (Subcutaneous Tissue) exposed. There is no tunneling or undermining noted. There is [name] medium amount of serosanguineous drainage noted. The wound margin is distinct with the outline attached to the wound base. There is large (67-100%) red, pink, pale granulation within the wound bed. There is [name] small (1-33%) amount of necrotic tissue within the wound bed including Adherent Slough. The periwound skin appearance had no abnormalities noted  for color. The periwound skin appearance exhibited: Callus, Dry/Scaly. The periwound skin appearance did not exhibit: Maceration. Periwound temperature was noted as No Abnormality. Assessment Active Problems ICD-10 Non-pressure chronic ulcer of other part of right foot with fat layer exposed Peripheral vascular disease, unspecified Atherosclerotic heart disease of native coronary artery without angina pectoris  Essential (primary) hypertension Type 2 diabetes mellitus with hyperglycemia Type 2 diabetes mellitus with diabetic neuropathy, unspecified Cerebral infarction, unspecified Procedures Wound #2 Pre-procedure diagnosis of Wound #2 is [name] Diabetic Wound/Ulcer of the Lower Extremity located on the Right,Plantar Foot .Severity of Tissue Pre Debridement is: Fat layer exposed. There was [name] Selective/Open Wound Non-Viable Tissue Debridement with [name] total area of 0.38 sq cm performed by Duanne Guess, MD. With the following instrument(s): Curette to remove Non-Viable tissue/material. Material removed includes Callus and Slough and after achieving pain control using Lidocaine 4% T opical Solution. No specimens were taken. [name] time out was conducted at 09:25, prior to the start of the procedure. [name] Minimum amount of bleeding was controlled with Pressure. The procedure was tolerated well. Post Debridement Measurements: 0.7cm length x 0.7cm width x 0.1cm depth; 0.038cm^3 volume. Character of Wound/Ulcer Post Debridement is improved. Severity of Tissue Post Debridement is: Fat layer exposed. Post procedure Diagnosis Wound #2: Same as Pre-Procedure General Notes: scribed for Dr. Lady Gary by Samuella Bruin, RN. Pre-procedure diagnosis of Wound #2 is [name] Diabetic Wound/Ulcer of the Lower Extremity located on the Right,Plantar Foot. [name] skin graft procedure using [name] bioengineered skin substitute/cellular or tissue based product was performed by Duanne Guess, MD with the following instrument(s): Forceps and  Scissors. Epicord was applied and secured with Steri-Strips. 3 sq cm of product was utilized and 3 sq cm was wasted due to wound size. Post Application, adaptic was applied. [name] Time Out was conducted at 09:28, prior to the start of the procedure. The procedure was tolerated well with [name] pain level of 0 throughout and [name] pain level of 0 following the procedure. Post procedure Diagnosis Wound #2: Same as Pre-Procedure General Notes: scribed for Dr. Lady Gary by Samuella Bruin, RN. Plan Follow-up Appointments: Return Appointment in 1 week. - Tuesday 03/09/23 at 9:15am Anesthetic: (In clinic) Topical Lidocaine 4% applied to wound bed Cellular or Tissue Based Products: Wound #2 Right,Plantar Foot: Cellular or Tissue Based Product Type: - Epicord #7 Cellular or Tissue Based Product applied to wound bed, secured with steri-strips, cover with Adaptic or Mepitel. (DO NOT REMOVE). Bathing/ Shower/ Hygiene: May shower with protection but do not get wound dressing(s) wet. Protect dressing(s) with water repellant cover (for example, large plastic bag) or [name] cast cover and may then take shower. Edema Control - Lymphedema / SCD / Other: Elevate legs to the level of the heart or above for 30 minutes daily and/or when sitting for 3-4 times [name] day throughout the day. Avoid standing for long periods of time. Patient to wear own compression stockings every day. Moisturize legs daily. Off-Loading: T Contact Cast to Right Lower Extremity - when available [name] [name], [name] [name] ([id number]) [phone].pdf Page 10 of 12 Open toe surgical shoe to: - with peg assist insert to right foot The following medication(s) was prescribed: lidocaine topical 4 % cream cream topical was prescribed at facility WOUND #2: - Foot Wound Laterality: Plantar, Right Cleanser: Soap and Water 1 x Per Week/30 Days Discharge Instructions: May shower and wash wound with dial antibacterial soap and water prior to dressing  change. Cleanser: Wound Cleanser 1 x Per Week/30 Days Discharge Instructions: Cleanse the wound with wound cleanser prior to applying [name] clean dressing using gauze sponges, not tissue or cotton balls. Peri-Wound Care: Zinc Oxide Ointment 30g tube 1 x Per Week/30 Days Discharge Instructions: Apply Zinc Oxide to periwound with each dressing change Topical: Skintegrity Hydrogel 4 (oz) 1 x Per Week/30 Days Discharge Instructions: Apply  hydrogel as directed Prim Dressing: ADAPTIC TOUCH 3x4.25 (in/in) 1 x Per Week/30 Days ary Discharge Instructions: Apply to wound bed as instructed Prim Dressing: EPICORD 1 x Per Week/30 Days ary Discharge Instructions: #6 (02/23/23) Secondary Dressing: Woven Gauze Sponge, Non-Sterile 4x4 in 1 x Per Week/30 Days Discharge Instructions: Apply over primary dressing as directed. Secondary Dressing: Zetuvit Plus 4x8 in 1 x Per Week/30 Days Discharge Instructions: Apply over primary dressing as directed. Secured With: Elastic Bandage 4 inch (ACE bandage) 1 x Per Week/30 Days Discharge Instructions: Secure with ACE bandage as directed. Secured With: [employer], 4.5x3.1 (in/yd) 1 x Per Week/30 Days Discharge Instructions: Secure with Kerlix as directed. 03/03/2023: His wound continues to contract. There is no maceration nor any significant callus. There is slight slough on the wound surface. No total contact casts were available in clinic today. I used [name] curette to debride slough and eschar from the wound. Epicort was then rehydrated with saline and applied in standard fashion. I applied zinc oxide to the periwound skin and secured everything in place with Adaptic and Steri-Strips. His foot was wrapped with Kerlix and Ace bandage. We will put him in [name] peg assist offloading insert this week while we await restocking of casts. Follow-up in 1 week's time. Electronic Signature(s) Signed: 03/03/2023 9:40:56 AM By: Duanne Guess MD FACS Entered By: Duanne Guess on  03/03/2023 09:40:55 -------------------------------------------------------------------------------- HxROS Details Patient Name: Date of Service: [name] [name], [name] [name] [name]. 03/03/2023 9:15 [name] Medical Record Number: [id number] Patient Account Number: [id number] Date of Birth/Sex: Treating RN: 11-03-1955 (67 y.o. M) Primary Care Provider: Arva Chafe Other Clinician: Referring Provider: Treating Provider/Extender: [name] in Treatment: 26 Information Obtained From Patient Respiratory Medical History: Positive for: Sleep Apnea Cardiovascular Medical History: Positive for: Arrhythmia - [name]-Fib; Coronary Artery Disease - s/p CABG; Deep Vein Thrombosis; Hypertension; Myocardial Infarction; Peripheral Arterial Disease - s/p Fempop x 2 Past Medical History Notes: CVA x 3 Endocrine Medical History: Positive for: Type II Diabetes Time with diabetes: since 1997 Treated with: Insulin, Oral agents Blood sugar tested every day: Yes Tested : 2-3x per day [name] [name], [name] [name] ([id number]) [id number].pdf Page 11 of 12 Musculoskeletal Medical History: Past Medical History Notes: carpal tunnel syndrome Neurologic Medical History: Positive for: Neuropathy Past Medical History Notes: stroke Oncologic Medical History: Past Medical History Notes: skin cancer Immunizations Pneumococcal Vaccine: Received Pneumococcal Vaccination: Yes Received Pneumococcal Vaccination On or After 60th Birthday: Yes Implantable Devices None Hospitalization / Surgery History Type of Hospitalization/Surgery colonoscopy polypectomy peripheral vascular cath shoulder arthroscopy carpal tunnel release coronary artery bypass appendectomy cardiac cath coronary angioplasty thrombectomy knee arthroplasty popliteal artery stent Family and Social History Unknown History: Yes; Never smoker; Marital Status - Single; Alcohol Use: Never; Drug Use: No History; Caffeine  Use: Rarely; Financial Concerns: No; Food, Clothing or Shelter Needs: No; Support System Lacking: No; Transportation Concerns: No Psychologist, prison and probation services) Signed: 03/03/2023 2:33:10 PM By: Duanne Guess MD FACS Entered By: Duanne Guess on 03/03/2023 09:38:45 -------------------------------------------------------------------------------- SuperBill Details Patient Name: Date of Service: [name] [name], [name] [name] [name]. 03/03/2023 Medical Record Number: [id number] Patient Account Number: [id number] Date of Birth/Sex: Treating RN: [birth year] (67 y.o. M) Primary Care Provider: Arva Chafe Other Clinician: Referring Provider: Treating Provider/Extender: [name] in Treatment: 26 Diagnosis Coding ICD-10 Codes Code Description [phone] Non-pressure chronic ulcer of other part of right foot with fat layer exposed I73.9 Peripheral vascular disease, unspecified I25.10 Atherosclerotic heart disease of native coronary artery without angina pectoris [name] [name], [name] [name] (  [id number]) [id number].pdf Page 12 of 12 I10 Essential (primary) hypertension E11.65 Type 2 diabetes mellitus with hyperglycemia E11.40 Type 2 diabetes mellitus with diabetic neuropathy, unspecified I63.9 Cerebral infarction, unspecified Facility Procedures : CPT4 Code: [id number] Description: Q4187 Epicord 2cm x 3cm - per sqcm Modifier: Quantity: 6 : CPT4 Code: [id number] Description: 15275 - SKIN SUB GRAFT FACE/NK/HF/G ICD-10 Diagnosis Description L97.512 Non-pressure chronic ulcer of other part of right foot with fat layer exposed Modifier: Quantity: 1 Physician Procedures : CPT4 Code Description Modifier [id number] - WC PHYS LEVEL 3 - EST PT 25 ICD-10 Diagnosis Description L97.512 Non-pressure chronic ulcer of other part of right foot with fat layer exposed E11.40 Type 2 diabetes mellitus with diabetic neuropathy,  unspecified I73.9 Peripheral vascular disease, unspecified E11.65  Type 2 diabetes mellitus with hyperglycemia Quantity: 1 : [id number] 15275 - WC PHYS SKIN SUB GRAFT FACE/NK/HF/G ICD-10 Diagnosis Description L97.512 Non-pressure chronic ulcer of other part of right foot with fat layer exposed Quantity: 1 Electronic Signature(s) Signed: 03/03/2023 9:41:20 AM By: Duanne Guess MD FACS Entered By: Duanne Guess on 03/03/2023 09:41:20

## 2023-03-08 ENCOUNTER — Other Ambulatory Visit: Payer: Self-pay | Admitting: Nurse Practitioner

## 2023-03-09 ENCOUNTER — Encounter (HOSPITAL_BASED_OUTPATIENT_CLINIC_OR_DEPARTMENT_OTHER): Payer: Medicare HMO | Admitting: General Surgery

## 2023-03-09 DIAGNOSIS — Z8673 Personal history of transient ischemic attack (TIA), and cerebral infarction without residual deficits: Secondary | ICD-10-CM | POA: Diagnosis not present

## 2023-03-09 DIAGNOSIS — G4733 Obstructive sleep apnea (adult) (pediatric): Secondary | ICD-10-CM | POA: Diagnosis not present

## 2023-03-09 DIAGNOSIS — I2510 Atherosclerotic heart disease of native coronary artery without angina pectoris: Secondary | ICD-10-CM | POA: Diagnosis not present

## 2023-03-09 DIAGNOSIS — I252 Old myocardial infarction: Secondary | ICD-10-CM | POA: Diagnosis not present

## 2023-03-09 DIAGNOSIS — E11621 Type 2 diabetes mellitus with foot ulcer: Secondary | ICD-10-CM | POA: Diagnosis not present

## 2023-03-09 DIAGNOSIS — I4891 Unspecified atrial fibrillation: Secondary | ICD-10-CM | POA: Diagnosis not present

## 2023-03-09 DIAGNOSIS — Z85828 Personal history of other malignant neoplasm of skin: Secondary | ICD-10-CM | POA: Diagnosis not present

## 2023-03-09 DIAGNOSIS — L97512 Non-pressure chronic ulcer of other part of right foot with fat layer exposed: Secondary | ICD-10-CM | POA: Diagnosis not present

## 2023-03-09 DIAGNOSIS — Z951 Presence of aortocoronary bypass graft: Secondary | ICD-10-CM | POA: Diagnosis not present

## 2023-03-09 DIAGNOSIS — E1151 Type 2 diabetes mellitus with diabetic peripheral angiopathy without gangrene: Secondary | ICD-10-CM | POA: Diagnosis not present

## 2023-03-09 NOTE — Progress Notes (Signed)
[name], [name] ([id number]) [id number] Page 1 of 12 Visit Report for 03/09/2023 Chief Complaint Document Details Patient Name: Date of Service: [name], [name] [place] [name]. 03/09/2023 9:15 [name] Medical Record Number: [id number] Patient Account Number: [id number] Date of Birth/Sex: Treating RN: 1955/07/26 (67 y.o. M) Primary Care Provider: Arva Chafe Other Clinician: Referring Provider: Treating Provider/Extender: [name] in Treatment: 27 Information Obtained from: Patient Chief Complaint 05/14/2020; patient is here for review of an abrasion injury on the left lateral calf 08/31/2022: [name] (1st metatarsal base, plantar) Electronic Signature(s) Signed: 03/09/2023 9:28:45 AM By: Duanne Guess MD FACS Entered By: Duanne Guess on 03/09/2023 06:28:44 -------------------------------------------------------------------------------- Cellular or Tissue Based Product Details Patient Name: Date of Service: [name] [name]. 03/09/2023 9:15 [name] Medical Record Number: [id number] Patient Account Number: [id number] Date of Birth/Sex: Treating RN: Mar 03, 1956 (67 y.o. [name] [name] Primary Care Provider: Arva Chafe Other Clinician: Referring Provider: Treating Provider/Extender: [name] in Treatment: 27 Cellular or Tissue Based Product Type Wound #2 Right,Plantar Foot Applied to: Performed By: Physician Duanne Guess, MD Cellular or Tissue Based Product Type: Epicord Level of Consciousness (Pre-procedure): Awake and Alert Pre-procedure Verification/Time Out Yes - 09:11 Taken: Location: genitalia / hands / feet / multiple digits Wound Size (sq cm): 0.48 Product Size (sq cm): 6 Waste Size (sq cm): 3 Waste Reason: wound size Amount of Product Applied (sq cm): 3 Instrument Used: Forceps, Scissors Lot #: [id number] Expiration Date: 09/18/2027 Fenestrated: No Reconstituted:  Yes Solution Type: normal saline Solution Amount: 4 ml Lot #: [id number] KS Solution Expiration Date: 08/10/2024 Secured: Yes Secured With: Steri-Strips Dressing Applied: Yes Primary Dressing: adaptic Procedural Pain: 0 Post Procedural Pain: 0 [name] [name] ([id number]) [id number] Page 2 of 12 Response to Treatment: Procedure was tolerated well Level of Consciousness (Post- Awake and Alert procedure): Post Procedure Diagnosis Same as Pre-procedure Notes scribed for [name] [name] by Samuella Bruin, RN Electronic Signature(s) Signed: 03/09/2023 12:39:46 PM By: Duanne Guess MD FACS Signed: 03/09/2023 4:21:24 PM By: Samuella Bruin Entered By: Samuella Bruin on 03/09/2023 06:12:26 -------------------------------------------------------------------------------- Debridement Details Patient Name: Date of Service: [name] [name]. 03/09/2023 9:15 [name] Medical Record Number: [id number] Patient Account Number: [id number] Date of Birth/Sex: Treating RN: [birth year] (67 y.o. [name] [name] Primary Care Provider: Arva Chafe Other Clinician: Referring Provider: Treating Provider/Extender: [name] in Treatment: 27 Debridement Performed for Assessment: Wound #2 Right,Plantar Foot Performed By: Physician Duanne Guess, MD Debridement Type: Debridement Severity of Tissue Pre Debridement: Fat layer exposed Level of Consciousness (Pre-procedure): Awake and Alert Pre-procedure Verification/Time Out Yes - 09:09 Taken: Start Time: 09:09 Pain Control: Lidocaine 4% Topical Solution Percent of Wound Bed Debrided: 100% T Area Debrided (cm): otal 0.38 Tissue and other material debrided: Non-Viable, Callus, Slough, Skin: Epidermis, Slough Level: Skin/Epidermis Debridement Description: Selective/Open Wound Instrument: Curette Bleeding: Minimum Hemostasis Achieved: Pressure Response to Treatment: Procedure was tolerated  well Level of Consciousness (Post- Awake and Alert procedure): Post Debridement Measurements of Total Wound Length: (cm) 0.8 Width: (cm) 0.6 Depth: (cm) 0.1 Volume: (cm) 0.038 Character of Wound/Ulcer Post Debridement: Improved Severity of Tissue Post Debridement: Fat layer exposed Post Procedure Diagnosis Same as Pre-procedure Notes scribed for [name] [name] by Samuella Bruin, RN Electronic Signature(s) Signed: 03/09/2023 12:39:46 PM By: Duanne Guess MD FACS Signed: 03/09/2023 4:21:24 PM By: Samuella Bruin Entered By: Samuella Bruin on 03/09/2023 06:11:09 [name] [name], [name] [name] ([id number]) [id number] Page 3 of 12 -------------------------------------------------------------------------------- HPI Details Patient Name: Date of Service:  [name] [name]. 03/09/2023 9:15 [name] Medical Record Number: [id number] Patient Account Number: [id number] Date of Birth/Sex: Treating RN: [birth year] (67 y.o. M) Primary Care Provider: Arva Chafe Other Clinician: Referring Provider: Treating Provider/Extender: [name] in Treatment: 27 History of Present Illness HPI Description: ADMISSION 05/14/2020; this is [name] with multiple medical issues. Predominantly he has type 2 diabetes with [name] and also history of fairly significant PAD. He had [name] in February 2017 he also had an atherectomy and angioplasty by Dr. [name] of the right popliteal artery in 2016. He is supposed to be getting arterial studies annually however this was interrupted last year because of the pandemic. He tells [place] he was at [facility] 2 weeks ago was getting out of of the scooter and traumatized his left lateral lower leg. There was [name] as the patient is on Plavix and Eliquis. They have been dressing this with Neosporin and doing [name] fairly good job. Wound  measures 2.5 x 3.5 it does not have any depth he does not have [name] in his legs outside of surgery however he does have chronic edema and skin changes suggestive of chronic venous disease possibly some degree of lymphedema as well. Past medical history includes type 2 diabetes with peripheral neuropathy and gait instability, lumbar spondylosis, obstructive sleep apnea, history of [name], basal cell skin cancer, atrial fibrillation on anticoagulation, significant PAD as noted with [name] and [name] in 30th 2016. He also has [name] of coronary artery disease with an MI in 2002 hypertension hyperlipidemia and heart failure with preserved ejection fraction His last arterial studies I can see in epic were on 03/10/2018 this showed [name] and [name] with monophasic waveforms on the right. On the left his ABI was 1.20 with [name] and triphasic waveforms. He has not had arterial studies since. Our nurse in the clinic got an ABI on the left of 1.1 11/2; left anterior leg wound in the setting of chronic venous insufficiency. Wound was initially trauma. We have been using Hydrofera Blue under compression he has home health.. The wound looks [name] lot better today with improvement in surface area 11/16; left anterior leg wound in the setting of chronic venous insufficiency. Wound was initially trauma. We have been using Hydrofera Blue under compression. The patient is closed today. He is supposed to follow-up with vein and vascular with regards to arterial insufficiency nevertheless his leg wounds are 12/3; apparently 2 weeks ago when they were putting on their stockings they managed to get 3 wounds on the left anterior lower leg from abrasion when putting on the stockings. Home health came by the week of Thanksgiving and put Hydrofera Blue  4-layer wrap on this and there is only one superficial area remaining. The patient and his wife complained about the difficulties getting stockings on I think we are using 20/30. We will order bilateral external compression stockings which should be easier. 12/10; wound on the left anterior lower leg is closed. He has chronic venous insufficiency we ordered him Farrow wrap stockings unfortunately he did not bring these in. 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 He says that  it has been present since mid December. He is currently residing in [facility] until March and [name] podiatrist has been looking after him there. They have been simply painting the area with Betadine. He has not had any lower extremity arterial studies since 2019, at which time his right ABI was 0.69. Measured in clinic today, it was 0.71. He is not aware of his most recent hemoglobin A1c, but historically he has had exceptionally poor control. On the basis of his right first metatarsal, there is [name] small crescent shaped wound. There is surrounding eschar and callus. There is no malodor or purulent drainage. 09/08/2022: The original wound is smaller today and fairly clean, but there is some discoloration and [name] pulpy texture to the adjacent callus. Underneath this, the tissue is open exposing the fat layer. It looks like perhaps there was [name] crack in the callus and moisture got under the skin and caused breakdown. 09/15/2022: There has been more moisture related tissue breakdown. The callus is very soft and the underlying tissue is more open. 09/28/2022: The wound looks much better. He has done [name] good job keeping it dry. There is some callus overlying much of the wound surface. There is slough on the exposed open areas. 10/14/2022: There has been [name] lot of moisture related tissue breakdown. He is still forming callus over the top and then it seems that moisture gets  underneath the callus and causes tissue damage. There is slough on the exposed wound surface. [place]. 10/21/2022: His foot is less wet, but there is no significant change to his wound. He has developed [name] blister on his left anterior tibial surface. There is no open wound here, but he does have some fairly significant edema. We are planning to apply [name] total contact cast today. 10/23/2022: Here for his obligatory first cast change. He says he has not had any issues wearing the cast or walking in the boot. No detrimental effects on his wound. 10/29/2022: The wound is looking better. Clearly, putting him in the cast has prevented water from getting in under his callus and causing further tissue breakdown. The blister on his left anterior tibial surface has not yet ruptured. Edema control is significantly improved. 11/05/2022: The wound on his right plantar foot is getting better. There is still some callus accumulation around the wounds, but there are just 2 open areas now, [name] very small 1 on the medial aspect of his metatarsal head and [name] little bit larger 1 on the plantar surface. The blister on his left anterior tibial surface is now open with hanging dry skin. 11/12/2022: The left anterior tibial wound is closed. The wound on his right plantar foot is smaller with just [name] little bit of callus around the edges. 11/19/2022: The right plantar foot wound continues to contract. The surface is quite clean. The tiny satellite area on the medial aspect of his foot has closed. [name] [name], [name] [name] ([id number]) [id number] Page 4 of 12 11/26/2022: The wound is smaller again today. He is responding well to the offloading effects of total contact casting. 12/03/2022: The wound is about the same size today. There is [name] little bit of moisture around the perimeter of the wound. No significant tissue breakdown. 12/10/2022: The wound is smaller today.  There has been no moisture-related tissue breakdown of any maceration. 12/21/2022: The wound continues to contract. Moisture control is excellent. 12/28/2022: The wound is [name] little bit smaller  today. 01/05/2023: The wound measures smaller today. Excellent moisture control with zero evidence of maceration. The wound surface is clean with healthy granulation tissue. 01/13/2023: We are leaving his Apligraf in situ for [name] second week. There is no discolored drainage or malodor coming from the site. 01/19/2023: The wound is smaller today. The periwound skin is in good condition without tissue maceration and the healed areas of tissue appear to have improved integrity. 01/27/2023: Continued contraction of the wound. No tissue maceration. The wound surface is flush with the surrounding skin. 02/02/2023: The wound measured [name] little bit smaller today. There is some bruising just adjacent to the wound, but this has not resulted in any tissue breakdown. 02/09/2023: The wound is smaller again today. The area of bruising that was seen last week has resolved and there has been no tissue damage as [name] result. There is some callus accumulation around the wound edges along with minimal soft slough on the surface. 02/16/2023: The wound measured [name] little bit larger today. There is [name] little bit of maceration around the wound edges, which is likely responsible. 02/23/2023: The wound is smaller today and there is epithelium encroaching around the edges. There is no periwound tissue maceration, nor has he accumulated any significant callus. 03/03/2023: His wound continues to contract. There is no maceration nor any significant callus. There is slight slough on the wound surface. 03/09/2023: The wound is [name] little bit smaller again today. He is managing very well in the peg assist offloading insert and there has been no tissue breakdown or periwound maceration as [name] result of being out of the total contact cast. Electronic  Signature(s) Signed: 03/09/2023 9:29:34 AM By: Duanne Guess MD FACS Entered By: Duanne Guess on 03/09/2023 06:29:34 -------------------------------------------------------------------------------- Physical Exam Details Patient Name: Date of Service: [name] [name]. 03/09/2023 9:15 [name] Medical Record Number: [id number] Patient Account Number: [id number] Date of Birth/Sex: Treating RN: 1955/10/18 (67 y.o. M) Primary Care Provider: Arva Chafe Other Clinician: Referring Provider: Treating Provider/Extender: [name] in Treatment: 27 Constitutional Hypertensive, asymptomatic. . . . no acute distress. Respiratory Normal work of breathing on room air. Notes 03/09/2023: The wound is [name] little bit smaller again today. Electronic Signature(s) Signed: 03/09/2023 9:30:05 AM By: Duanne Guess MD FACS Entered By: Duanne Guess on 03/09/2023 06:30:05 -------------------------------------------------------------------------------- Physician Orders Details Patient Name: Date of Service: [name] [name]. 03/09/2023 9:15 [name] Renee Harder, [place] [name] ([id number]) [id number] Page 5 of 12 Medical Record Number: [id number] Patient Account Number: [id number] Date of Birth/Sex: Treating RN: 1955/11/03 (67 y.o. [name] [name] Primary Care Provider: Arva Chafe Other Clinician: Referring Provider: Treating Provider/Extender: [name] in Treatment: 27 Verbal / Phone Orders: No Diagnosis Coding ICD-10 Coding Code Description L97.512 Non-pressure chronic ulcer of other part of right foot with fat layer exposed I73.9 Peripheral vascular disease, unspecified I25.10 Atherosclerotic heart disease of native coronary artery without angina pectoris I10 Essential (primary) hypertension E11.65 Type 2 diabetes mellitus with hyperglycemia E11.40 Type 2 diabetes mellitus with diabetic neuropathy,  unspecified I63.9 Cerebral infarction, unspecified Follow-up Appointments ppointment in 1 week. - [name] [name] - room 2 Return [name] Anesthetic (In clinic) Topical Lidocaine 4% applied to wound bed Cellular or Tissue Based Products Wound #2 Right,Plantar Foot Cellular or Tissue Based Product Type: - Epicord #8 Cellular or Tissue Based Product applied to wound bed, secured with steri-strips, cover with Adaptic or Mepitel. (DO NOT REMOVE). Bathing/ Shower/ Hygiene May shower with protection but do not get  wound dressing(s) wet. Protect dressing(s) with water repellant cover (for example, large plastic bag) or [name] cast cover and may then take shower. Edema Control - Lymphedema / SCD / Other Elevate legs to the level of the heart or above for 30 minutes daily and/or when sitting for 3-4 times [name] day throughout the day. Avoid standing for long periods of time. Patient to wear own compression stockings every day. Moisturize legs daily. Off-Loading Total Contact Cast to Right Lower Extremity - when available Open toe surgical shoe to: - with peg assist insert to right foot Wound Treatment Wound #2 - Foot Wound Laterality: Plantar, Right Cleanser: Soap and Water 1 x Per Week/30 Days Discharge Instructions: May shower and wash wound with dial antibacterial soap and water prior to dressing change. Cleanser: Wound Cleanser 1 x Per Week/30 Days Discharge Instructions: Cleanse the wound with wound cleanser prior to applying [name] clean dressing using gauze sponges, not tissue or cotton balls. Peri-Wound Care: Zinc Oxide Ointment 30g tube 1 x Per Week/30 Days Discharge Instructions: Apply Zinc Oxide to periwound with each dressing change Topical: Skintegrity Hydrogel 4 (oz) 1 x Per Week/30 Days Discharge Instructions: Apply hydrogel as directed Prim Dressing: ADAPTIC TOUCH 3x4.25 (in/in) 1 x Per Week/30 Days ary Discharge Instructions: Apply to wound bed as instructed Prim Dressing: EPICORD 1 x Per Week/30  Days ary Discharge Instructions: #6 (02/23/23) Secondary Dressing: Woven Gauze Sponge, Non-Sterile 4x4 in 1 x Per Week/30 Days Discharge Instructions: Apply over primary dressing as directed. Secondary Dressing: Zetuvit Plus 4x8 in 1 x Per Week/30 Days Discharge Instructions: Apply over primary dressing as directed. Secured With: Elastic Bandage 4 inch (ACE bandage) 1 x Per Week/30 Days Discharge Instructions: Secure with ACE bandage as directed. Secured With: [employer], 4.5x3.1 (in/yd) 1 x Per Week/30 Days Discharge Instructions: Secure with Kerlix as directed. [name] [name], [name] [name] ([id number]) [id number] Page 6 of 12 Patient Medications llergies: No Known Drug Allergies [name] Notifications Medication Indication Start End 03/09/2023 lidocaine DOSE topical 4 % cream - cream topical Electronic Signature(s) Signed: 03/09/2023 12:39:46 PM By: Duanne Guess MD FACS Entered By: Duanne Guess on 03/09/2023 06:30:54 -------------------------------------------------------------------------------- Problem List Details Patient Name: Date of Service: [name] [name]. 03/09/2023 9:15 [name] Medical Record Number: [id number] Patient Account Number: [id number] Date of Birth/Sex: Treating RN: [birth year] (67 y.o. M) Primary Care Provider: Arva Chafe Other Clinician: Referring Provider: Treating Provider/Extender: [name] in Treatment: 27 Active Problems ICD-10 Encounter Code Description Active Date MDM Diagnosis L97.512 Non-pressure chronic ulcer of other part of right foot with fat layer exposed 08/31/2022 No Yes I73.9 Peripheral vascular disease, unspecified 08/31/2022 No Yes I25.10 Atherosclerotic heart disease of native coronary artery without angina pectoris 08/31/2022 No Yes I10 Essential (primary) hypertension 08/31/2022 No Yes E11.65 Type 2 diabetes mellitus with hyperglycemia 08/31/2022 No Yes E11.40 Type 2 diabetes  mellitus with diabetic neuropathy, unspecified 08/31/2022 No Yes I63.9 Cerebral infarction, unspecified 08/31/2022 No Yes Inactive Problems Resolved Problems ICD-10 Code Description Active Date Resolved Date L97.821 Non-pressure chronic ulcer of other part of left lower leg limited to breakdown of skin 11/05/2022 11/05/2022 [name] [name], [name] [name] ([id number]) [phone].pdf Page 7 of 12 Electronic Signature(s) Signed: 03/09/2023 9:28:09 AM By: Duanne Guess MD FACS Entered By: Duanne Guess on 03/09/2023 06:28:08 -------------------------------------------------------------------------------- Progress Note Details Patient Name: Date of Service: Pettinger, [name] [name]. 03/09/2023 9:15 [name] Medical Record Number: [id number] Patient Account Number: [id number] Date of Birth/Sex: Treating RN: 1956/06/03 (67 y.o. M) Primary Care Provider: Carmelia Roller,  Janyth Pupa Other Clinician: Referring Provider: Treating Provider/Extender: [name] in Treatment: 27 Subjective Chief Complaint Information obtained from Patient 05/14/2020; patient is here for review of an abrasion injury on the left lateral calf 08/31/2022: [name] (1st metatarsal base, plantar) History of Present Illness (HPI) ADMISSION 05/14/2020; this is [name] with multiple medical issues. Predominantly he has type 2 diabetes with [name] and also history of fairly significant PAD. He had [name] in February 2017 he also had an atherectomy and angioplasty by Dr. [name] of the right popliteal artery in 2016. He is supposed to be getting arterial studies annually however this was interrupted last year because of the pandemic. He tells [place] he was at [facility] 2 weeks ago was getting out of of the scooter and traumatized his left lateral lower leg. There was [name] as the patient is on Plavix and Eliquis.  They have been dressing this with Neosporin and doing [name] fairly good job. Wound measures 2.5 x 3.5 it does not have any depth he does not have [name] in his legs outside of surgery however he does have chronic edema and skin changes suggestive of chronic venous disease possibly some degree of lymphedema as well. Past medical history includes type 2 diabetes with peripheral neuropathy and gait instability, lumbar spondylosis, obstructive sleep apnea, history of [name], basal cell skin cancer, atrial fibrillation on anticoagulation, significant PAD as noted with [name] and [name] in 30th 2016. He also has [name] of coronary artery disease with an MI in 2002 hypertension hyperlipidemia and heart failure with preserved ejection fraction His last arterial studies I can see in epic were on 03/10/2018 this showed [name] and [name] with monophasic waveforms on the right. On the left his ABI was 1.20 with [name] and triphasic waveforms. He has not had arterial studies since. Our nurse in the clinic got an ABI on the left of 1.1 11/2; left anterior leg wound in the setting of chronic venous insufficiency. Wound was initially trauma. We have been using Hydrofera Blue under compression he has home health.. The wound looks [name] lot better today with improvement in surface area 11/16; left anterior leg wound in the setting of chronic venous insufficiency. Wound was initially trauma. We have been using Hydrofera Blue under compression. The patient is closed today. He is supposed to follow-up with vein and vascular with regards to arterial insufficiency nevertheless his leg wounds are 12/3; apparently 2 weeks ago when they were putting on their stockings they managed to get 3 wounds on the left anterior lower leg from abrasion when putting on the  stockings. Home health came by the week of Thanksgiving and put Hydrofera Blue 4-layer wrap on this and there is only one superficial area remaining. The patient and his wife complained about the difficulties getting stockings on I think we are using 20/30. We will order bilateral external compression stockings which should be easier. 12/10; wound on the left anterior lower leg is closed. He has chronic venous insufficiency we ordered him Farrow wrap stockings unfortunately he did not bring these in. 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 He says that  it has been present since mid December. He is currently residing in [facility] until March and [name] podiatrist has been looking after him there. They have been simply painting the area with Betadine. He has not had any lower extremity arterial studies since 2019, at which time his right ABI was 0.69. Measured in clinic today, it was 0.71. He is not aware of his most recent hemoglobin A1c, but historically he has had exceptionally poor control. On the basis of his right first metatarsal, there is [name] small crescent shaped wound. There is surrounding eschar and callus. There is no malodor or purulent drainage. 09/08/2022: The original wound is smaller today and fairly clean, but there is some discoloration and [name] pulpy texture to the adjacent callus. Underneath this, the tissue is open exposing the fat layer. It looks like perhaps there was [name] crack in the callus and moisture got under the skin and caused breakdown. 09/15/2022: There has been more moisture related tissue breakdown. The callus is very soft and the underlying tissue is more open. 09/28/2022: The wound looks much better. He has done [name] good job keeping it dry. There is some callus overlying much of the wound surface. There is slough on the exposed open areas. 10/14/2022: There has been [name] lot of moisture related tissue breakdown.  He is still forming callus over the top and then it seems that moisture gets underneath the callus and causes tissue damage. There is slough on the exposed wound surface. [place] 10/21/2022: His foot is less wet, but there is no significant change to his wound. He has developed [name] blister on his left anterior tibial surface. There is no open [name] [name], [name] [name] ([id number]) [phone].pdf Page 8 of 12 wound here, but he does have some fairly significant edema. We are planning to apply [name] total contact cast today. 10/23/2022: Here for his obligatory first cast change. He says he has not had any issues wearing the cast or walking in the boot. No detrimental effects on his wound. 10/29/2022: The wound is looking better. Clearly, putting him in the cast has prevented water from getting in under his callus and causing further tissue breakdown. The blister on his left anterior tibial surface has not yet ruptured. Edema control is significantly improved. 11/05/2022: The wound on his right plantar foot is getting better. There is still some callus accumulation around the wounds, but there are just 2 open areas now, [name] very small 1 on the medial aspect of his metatarsal head and [name] little bit larger 1 on the plantar surface. The blister on his left anterior tibial surface is now open with hanging dry skin. 11/12/2022: The left anterior tibial wound is closed. The wound on his right plantar foot is smaller with just [name] little bit of callus around the edges. 11/19/2022: The right plantar foot wound continues to contract. The surface is quite clean. The tiny satellite area on the medial aspect of his foot has closed. 11/26/2022: The wound is smaller again today. He is responding well to the offloading effects of total contact casting. 12/03/2022: The wound is about the same size today. There is [name] little bit of moisture around the perimeter of the  wound. No significant tissue breakdown. 12/10/2022: The wound is smaller today. There has been no moisture-related tissue breakdown of any maceration. 12/21/2022: The wound continues to contract. Moisture control is excellent. 12/28/2022: The wound is [name] little bit smaller  today. 01/05/2023: The wound measures smaller today. Excellent moisture control with zero evidence of maceration. The wound surface is clean with healthy granulation tissue. 01/13/2023: We are leaving his Apligraf in situ for [name] second week. There is no discolored drainage or malodor coming from the site. 01/19/2023: The wound is smaller today. The periwound skin is in good condition without tissue maceration and the healed areas of tissue appear to have improved integrity. 01/27/2023: Continued contraction of the wound. No tissue maceration. The wound surface is flush with the surrounding skin. 02/02/2023: The wound measured [name] little bit smaller today. There is some bruising just adjacent to the wound, but this has not resulted in any tissue breakdown. 02/09/2023: The wound is smaller again today. The area of bruising that was seen last week has resolved and there has been no tissue damage as [name] result. There is some callus accumulation around the wound edges along with minimal soft slough on the surface. 02/16/2023: The wound measured [name] little bit larger today. There is [name] little bit of maceration around the wound edges, which is likely responsible. 02/23/2023: The wound is smaller today and there is epithelium encroaching around the edges. There is no periwound tissue maceration, nor has he accumulated any significant callus. 03/03/2023: His wound continues to contract. There is no maceration nor any significant callus. There is slight slough on the wound surface. 03/09/2023: The wound is [name] little bit smaller again today. He is managing very well in the peg assist offloading insert and there has been no tissue breakdown or periwound maceration as  [name] result of being out of the total contact cast. Patient History Information obtained from Patient. Family History Unknown History. Social History Never smoker, Marital Status - Single, Alcohol Use - Never, Drug Use - No History, Caffeine Use - Rarely. Medical History Respiratory Patient has history of Sleep Apnea Cardiovascular Patient has history of Arrhythmia - [name]-Fib, Coronary Artery Disease - s/p CABG, Deep Vein Thrombosis, Hypertension, Myocardial Infarction, Peripheral Arterial Disease - s/p Fempop x 2 Endocrine Patient has history of Type II Diabetes Neurologic Patient has history of Neuropathy Hospitalization/Surgery History - colonoscopy. - polypectomy. - peripheral vascular cath. - shoulder arthroscopy. - carpal tunnel release. - coronary artery bypass. - appendectomy. - cardiac cath. - coronary angioplasty. - thrombectomy. - knee arthroplasty. - popliteal artery stent. 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 no acute distress. Vitals Time Taken: 8:57 AM, Height: 70 in, Weight: 260 lbs, BMI: 37.3, Temperature: 98 F, Pulse: 71 bpm, Respiratory Rate: 18 breaths/min, Blood Pressure: 186/76 mmHg. Respiratory Normal work of breathing on room air. General Notes: 03/09/2023: The wound is [name] little bit smaller again today. Integumentary (Hair, Skin) Wound #2 status is Open. Original cause of wound was Gradually Appeared. The date acquired was: 07/08/2022. The wound has been in treatment 27 weeks. The wound is located on the Right,Plantar Foot. The wound measures 0.8cm length x 0.6cm width x 0.1cm depth; 0.377cm^2 area and 0.038cm^3 volume. There is Fat Layer (Subcutaneous Tissue) exposed. There is no tunneling or undermining noted. There is [name] medium  amount of serosanguineous drainage noted. The wound margin is distinct with the outline attached to the wound base. There is large (67-100%) red, pink granulation within the wound bed. There is [name] small (1-33%) amount of necrotic tissue within the wound bed including Adherent Slough. The periwound  skin appearance had no abnormalities noted for color. The periwound skin appearance exhibited: Callus, Dry/Scaly. The periwound skin appearance did not exhibit: Maceration. Periwound temperature was noted as No Abnormality. Assessment Active Problems ICD-10 Non-pressure chronic ulcer of other part of right foot with fat layer exposed Peripheral vascular disease, unspecified Atherosclerotic heart disease of native coronary artery without angina pectoris Essential (primary) hypertension Type 2 diabetes mellitus with hyperglycemia Type 2 diabetes mellitus with diabetic neuropathy, unspecified Cerebral infarction, unspecified Procedures Wound #2 Pre-procedure diagnosis of Wound #2 is [name] Diabetic Wound/Ulcer of the Lower Extremity located on the Right,Plantar Foot .Severity of Tissue Pre Debridement is: Fat layer exposed. There was [name] Selective/Open Wound Skin/Epidermis Debridement with [name] total area of 0.38 sq cm performed by Duanne Guess, MD. With the following instrument(s): Curette to remove Non-Viable tissue/material. Material removed includes Callus, Slough, and Skin: Epidermis after achieving pain control using Lidocaine 4% T opical Solution. No specimens were taken. [name] time out was conducted at 09:09, prior to the start of the procedure. [name] Minimum amount of bleeding was controlled with Pressure. The procedure was tolerated well. Post Debridement Measurements: 0.8cm length x 0.6cm width x 0.1cm depth; 0.038cm^3 volume. Character of Wound/Ulcer Post Debridement is improved. Severity of Tissue Post Debridement is: Fat layer exposed. Post procedure Diagnosis Wound #2: Same as Pre-Procedure General  Notes: scribed for [name] [name] by Samuella Bruin, RN. Pre-procedure diagnosis of Wound #2 is [name] Diabetic Wound/Ulcer of the Lower Extremity located on the Right,Plantar Foot. [name] skin graft procedure using [name] bioengineered skin substitute/cellular or tissue based product was performed by Duanne Guess, MD with the following instrument(s): Forceps and Scissors. Epicord was applied and secured with Steri-Strips. 3 sq cm of product was utilized and 3 sq cm was wasted due to wound size. Post Application, adaptic was applied. [name] Time Out was conducted at 09:11, prior to the start of the procedure. The procedure was tolerated well with [name] pain level of 0 throughout and [name] pain level of 0 following the procedure. Post procedure Diagnosis Wound #2: Same as Pre-Procedure General Notes: scribed for [name] [name] by Samuella Bruin, RN. Plan Follow-up Appointments: Return Appointment in 1 week. - [name] [name] - room 2 Anesthetic: (In clinic) Topical Lidocaine 4% applied to wound bed Cellular or Tissue Based Products: Wound #2 Right,Plantar Foot: Cellular or Tissue Based Product Type: - Epicord #8 Cellular or Tissue Based Product applied to wound bed, secured with steri-strips, cover with Adaptic or Mepitel. (DO NOT REMOVE). Bathing/ Shower/ Hygiene: May shower with protection but do not get wound dressing(s) wet. Protect dressing(s) with water repellant cover (for example, large plastic bag) or [name] cast cover and may then take shower. Edema Control - Lymphedema / SCD / Other: [name] [name], [name] [name] ([id number]) [phone].pdf Page 10 of 12 Elevate legs to the level of the heart or above for 30 minutes daily and/or when sitting for 3-4 times [name] day throughout the day. Avoid standing for long periods of time. Patient to wear own compression stockings every day. Moisturize legs daily. Off-Loading: T Contact Cast to Right Lower Extremity - when available otal Open toe surgical shoe to: - with peg  assist insert to right foot The following medication(s) was prescribed: lidocaine topical 4 % cream cream topical was prescribed at facility WOUND #2: - Foot Wound Laterality: Plantar, Right Cleanser: Soap and Water 1 x Per Week/30 Days Discharge Instructions: May shower and wash wound with dial antibacterial soap and water prior to dressing change. Cleanser: Wound Cleanser 1  x Per Week/30 Days Discharge Instructions: Cleanse the wound with wound cleanser prior to applying [name] clean dressing using gauze sponges, not tissue or cotton balls. Peri-Wound Care: Zinc Oxide Ointment 30g tube 1 x Per Week/30 Days Discharge Instructions: Apply Zinc Oxide to periwound with each dressing change Topical: Skintegrity Hydrogel 4 (oz) 1 x Per Week/30 Days Discharge Instructions: Apply hydrogel as directed Prim Dressing: ADAPTIC TOUCH 3x4.25 (in/in) 1 x Per Week/30 Days ary Discharge Instructions: Apply to wound bed as instructed Prim Dressing: EPICORD 1 x Per Week/30 Days ary Discharge Instructions: #6 (02/23/23) Secondary Dressing: Woven Gauze Sponge, Non-Sterile 4x4 in 1 x Per Week/30 Days Discharge Instructions: Apply over primary dressing as directed. Secondary Dressing: Zetuvit Plus 4x8 in 1 x Per Week/30 Days Discharge Instructions: Apply over primary dressing as directed. Secured With: Elastic Bandage 4 inch (ACE bandage) 1 x Per Week/30 Days Discharge Instructions: Secure with ACE bandage as directed. Secured With: [employer], 4.5x3.1 (in/yd) 1 x Per Week/30 Days Discharge Instructions: Secure with Kerlix as directed. 03/09/2023: The wound is [name] little bit smaller again today. He is managing very well in the peg assist offloading insert and there has been no tissue breakdown or periwound maceration as [name] result of being out of the total contact cast. I used [name] curette to debride callus and slough from his wound. Epicord was then rehydrated and applied in standard fashion. I applied zinc oxide to  the periwound and secured everything with Adaptic and Steri-Strips. Continue offloading with the peg assist shoe and follow-up in 1 week. Electronic Signature(s) Signed: 03/09/2023 9:31:37 AM By: Duanne Guess MD FACS Entered By: Duanne Guess on 03/09/2023 06:31:37 -------------------------------------------------------------------------------- HxROS Details Patient Name: Date of Service: [name] [name]. 03/09/2023 9:15 [name] Medical Record Number: [id number] Patient Account Number: [id number] Date of Birth/Sex: Treating RN: 26-Nov-1955 (67 y.o. M) Primary Care Provider: Arva Chafe Other Clinician: Referring Provider: Treating Provider/Extender: [name] in Treatment: 27 Information Obtained From Patient Respiratory Medical History: Positive for: Sleep Apnea Cardiovascular Medical History: Positive for: Arrhythmia - [name]-Fib; Coronary Artery Disease - s/p CABG; Deep Vein Thrombosis; Hypertension; Myocardial Infarction; Peripheral Arterial Disease - s/p Fempop x 2 Past Medical History Notes: CVA x 3 Endocrine Medical History: Positive for: Type II Diabetes [name] [name], Kienan [name] ([id number]) [id number] Page 11 of 12 Time with diabetes: since 1997 Treated with: Insulin, Oral agents Blood sugar tested every day: Yes Tested : 2-3x per day Musculoskeletal Medical History: Past Medical History Notes: carpal tunnel syndrome Neurologic Medical History: Positive for: Neuropathy Past Medical History Notes: stroke Oncologic Medical History: Past Medical History Notes: skin cancer Immunizations Pneumococcal Vaccine: Received Pneumococcal Vaccination: Yes Received Pneumococcal Vaccination On or After 60th Birthday: Yes Implantable Devices None Hospitalization / Surgery History Type of Hospitalization/Surgery colonoscopy polypectomy peripheral vascular cath shoulder arthroscopy carpal tunnel release coronary artery  bypass appendectomy cardiac cath coronary angioplasty thrombectomy knee arthroplasty popliteal artery stent Family and Social History Unknown History: Yes; Never smoker; Marital Status - Single; Alcohol Use: Never; Drug Use: No History; Caffeine Use: Rarely; Financial Concerns: No; Food, Clothing or Shelter Needs: No; Support System Lacking: No; Transportation Concerns: No Psychologist, prison and probation services) Signed: 03/09/2023 12:39:46 PM By: Duanne Guess MD FACS Entered By: Duanne Guess on 03/09/2023 06:29:41 -------------------------------------------------------------------------------- SuperBill Details Patient Name: Date of Service: [name] RK [name]. 03/09/2023 Medical Record Number: [id number] Patient Account Number: [id number] Date of Birth/Sex: Treating RN: 1955/10/26 (67 y.o. M) Primary Care Provider: Arva Chafe Other Clinician: Referring Provider: Treating  Provider/Extender: Ocie Doyne Weeks in Treatment: 27 Diagnosis Coding Ursin, Mohd [name] ([id number]) [id number] Page 12 of 12 ICD-10 Codes Code Description [phone] Non-pressure chronic ulcer of other part of right foot with fat layer exposed I73.9 Peripheral vascular disease, unspecified I25.10 Atherosclerotic heart disease of native coronary artery without angina pectoris I10 Essential (primary) hypertension E11.65 Type 2 diabetes mellitus with hyperglycemia E11.40 Type 2 diabetes mellitus with diabetic neuropathy, unspecified I63.9 Cerebral infarction, unspecified Facility Procedures : CPT4 Code: [id number] Description: Q4187 Epicord 2cm x 3cm - per sqcm Modifier: Quantity: 6 : CPT4 Code: [id number] Description: 15275 - SKIN SUB GRAFT FACE/NK/HF/G ICD-10 Diagnosis Description L97.512 Non-pressure chronic ulcer of other part of right foot with fat layer exposed Modifier: Quantity: 1 Physician Procedures : CPT4 Code Description Modifier [id number] - WC PHYS LEVEL  3 - EST PT 25 ICD-10 Diagnosis Description L97.512 Non-pressure chronic ulcer of other part of right foot with fat layer exposed I73.9 Peripheral vascular disease, unspecified E11.65 Type 2  diabetes mellitus with hyperglycemia E11.40 Type 2 diabetes mellitus with diabetic neuropathy, unspecified Quantity: 1 : [id number] 15275 - WC PHYS SKIN SUB GRAFT FACE/NK/HF/G ICD-10 Diagnosis Description L97.512 Non-pressure chronic ulcer of other part of right foot with fat layer exposed Quantity: 1 Electronic Signature(s) Signed: 03/09/2023 9:32:42 AM By: Duanne Guess MD FACS Entered By: Duanne Guess on 03/09/2023 06:32:42

## 2023-03-09 NOTE — Progress Notes (Signed)
ZEPHYRUS, MARCINEK A ([id number]) [id number] Page 1 of 8 Visit Report for 03/09/2023 Arrival Information Details Patient Name: Date of Service: [place], [name] 03/09/2023 9:15 A M Medical Record Number: [id number] Patient Account Number: [id number] Date of Birth/Sex: Treating RN: 1956-07-13 (67 y.o. Lenise Herald, Ladona Ridgel Primary Care Mohamad Bruso: Arva Chafe Other Clinician: Referring Donyelle Enyeart: Treating Halle Davlin/Extender: [name] in Treatment: 27 Visit Information History Since Last Visit Added or deleted any medications: No Patient Arrived: Ambulatory Any new allergies or adverse reactions: No Arrival Time: 08:55 Had a fall or experienced change in No Accompanied By: partner activities of daily living that may affect Transfer Assistance: None risk of falls: Patient Identification Verified: Yes Signs or symptoms of abuse/neglect since last visito No Secondary Verification Process Completed: Yes Hospitalized since last visit: No Patient Requires Transmission-Based Precautions: No Implantable device outside of the clinic excluding No Patient Has Alerts: No cellular tissue based products placed in the center since last visit: Has Dressing in Place as Prescribed: Yes Pain Present Now: No Electronic Signature(s) Signed: 03/09/2023 4:21:24 PM By: Samuella Bruin Entered By: Samuella Bruin on 03/09/2023 05:56:00 -------------------------------------------------------------------------------- Encounter Discharge Information Details Patient Name: Date of Service: [name], [name] 03/09/2023 9:15 A M Medical Record Number: [id number] Patient Account Number: [id number] Date of Birth/Sex: Treating RN: [birth year] (67 y.o. [name] Primary Care Arlee Santosuosso: Arva Chafe Other Clinician: Referring Jennilyn Esteve: Treating Joyell Emami/Extender: [name] in Treatment: 27 Encounter Discharge  Information Items Post Procedure Vitals Discharge Condition: Stable Temperature (F): 98 Ambulatory Status: Ambulatory Pulse (bpm): 71 Discharge Destination: Home Respiratory Rate (breaths/min): 18 Transportation: Private Auto Blood Pressure (mmHg): 186/76 Accompanied By: self Schedule Follow-up Appointment: Yes Clinical Summary of Care: Patient Declined Electronic Signature(s) Signed: 03/09/2023 4:21:24 PM By: Samuella Bruin Entered By: Samuella Bruin on 03/09/2023 06:25:57 Mccary, Lankford A ([id number]) [id number] Page 2 of 8 -------------------------------------------------------------------------------- Lower Extremity Assessment Details Patient Name: Date of Service: EMMAUS, KOVACIC A. 03/09/2023 9:15 A M Medical Record Number: [id number] Patient Account Number: [id number] Date of Birth/Sex: Treating RN: 1955/10/03 (67 y.o. [name] Primary Care Raechal Raben: Arva Chafe Other Clinician: Referring Caryl Fate: Treating Ellowyn Rieves/Extender: [name] in Treatment: 27 Edema Assessment Assessed: [name]: No] [Right: No] Edema: [Left: Ye] [Right: s] Calf Left: Right: Point of Measurement: From Medial Instep 38.3 cm Ankle Left: Right: Point of Measurement: From Medial Instep 23.9 cm Vascular Assessment Extremity colors, hair growth, and conditions: Extremity Color: [Right:Normal] Hair Growth on Extremity: [Right:Yes] Temperature of Extremity: [Right:Warm] Capillary Refill: [Right:< 3 seconds] Dependent Rubor: [Right:No No] Electronic Signature(s) Signed: 03/09/2023 4:21:24 PM By: Samuella Bruin Entered By: Samuella Bruin on 03/09/2023 05:56:10 -------------------------------------------------------------------------------- Multi Wound Chart Details Patient Name: Date of Service: [name], [name] 03/09/2023 9:15 A M Medical Record Number: [id number] Patient Account Number: [id number] Date of Birth/Sex:  Treating RN: 1955/11/28 (67 y.o. M) M) Primary Care Victorious Kundinger: Arva Chafe Other Clinician: Referring Evea Sheek: Treating Anida Deol/Extender: [name] in Treatment: 27 Vital Signs Height(in): 70 Pulse(bpm): 71 Weight(lbs): 260 Blood Pressure(mmHg): 186/76 Body Mass Index(BMI): 37.3 Temperature(F): 98 Respiratory Rate(breaths/min): 18 [2:Photos:] [N/A:N/A] Right, Plantar Foot N/A N/A Wound Location: Gradually Appeared N/A N/A Wounding Event: Diabetic Wound/Ulcer of the Lower N/A N/A Primary Etiology: Extremity Sleep Apnea, Arrhythmia, Coronary N/A N/A Comorbid History: Artery Disease, Deep Vein Thrombosis, Hypertension, Myocardial Infarction, Peripheral Arterial Disease, Type II Diabetes, Neuropathy 07/08/2022 N/A N/A Date Acquired: 41 N/A N/A Weeks of Treatment: Open N/A N/A Wound Status: No N/A N/A Wound  Recurrence: 0.8x0.6x0.1 N/A N/A Measurements L x W x D (cm) 0.377 N/A N/A A (cm) : rea 0.038 N/A N/A Volume (cm) : 85.20% N/A N/A % Reduction in A rea: 85.10% N/A N/A % Reduction in Volume: Grade 1 N/A N/A Classification: Medium N/A N/A Exudate A mount: Serosanguineous N/A N/A Exudate Type: red, brown N/A N/A Exudate Color: Distinct, outline attached N/A N/A Wound Margin: Large (67-100%) N/A N/A Granulation A mount: Red, Pink N/A N/A Granulation Quality: Small (1-33%) N/A N/A Necrotic A mount: Fat Layer (Subcutaneous Tissue): Yes N/A N/A Exposed Structures: Fascia: No Tendon: No Muscle: No Joint: No Bone: No Medium (34-66%) N/A N/A Epithelialization: Debridement - Selective/Open Wound N/A N/A Debridement: Pre-procedure Verification/Time Out 09:09 N/A N/A Taken: Lidocaine 4% T opical Solution N/A N/A Pain Control: Callus, Slough N/A N/A Tissue Debrided: Skin/Epidermis N/A N/A Level: 0.38 N/A N/A Debridement A (sq cm): rea Curette N/A N/A Instrument: Minimum N/A N/A Bleeding: Pressure N/A  N/A Hemostasis A chieved: Procedure was tolerated well N/A N/A Debridement Treatment Response: 0.8x0.6x0.1 N/A N/A Post Debridement Measurements L x W x D (cm) 0.038 N/A N/A Post Debridement Volume: (cm) Callus: Yes N/A N/A Periwound Skin Texture: Dry/Scaly: Yes N/A N/A Periwound Skin Moisture: Maceration: No No Abnormalities Noted N/A N/A Periwound Skin Color: No Abnormality N/A N/A Temperature: Cellular or Tissue Based Product N/A N/A Procedures Performed: Debridement Treatment Notes Wound #2 (Foot) Wound Laterality: Plantar, Right Cleanser Soap and Water Discharge Instruction: May shower and wash wound with dial antibacterial soap and water prior to dressing change. Wound Cleanser Discharge Instruction: Cleanse the wound with wound cleanser prior to applying a clean dressing using gauze sponges, not tissue or cotton balls. Peri-Wound Care Zinc Oxide Ointment 30g tube Discharge Instruction: Apply Zinc Oxide to periwound with each dressing change Topical Skintegrity Hydrogel 4 (oz) Discharge Instruction: Apply hydrogel as directed Primary Dressing ADAPTIC TOUCH 3x4.25 (in/in) Discharge Instruction: Apply to wound bed as instructed Menser, Shriyan A ([id number]) [id number] Page 4 of 8 EPICORD Discharge Instruction: #6 (02/23/23) Secondary Dressing Woven Gauze Sponge, Non-Sterile 4x4 in Discharge Instruction: Apply over primary dressing as directed. Zetuvit Plus 4x8 in Discharge Instruction: Apply over primary dressing as directed. Secured With Elastic Bandage 4 inch (ACE bandage) Discharge Instruction: Secure with ACE bandage as directed. Kerlix Roll Sterile, 4.5x3.1 (in/yd) Discharge Instruction: Secure with Kerlix as directed. Compression Wrap Compression Stockings Add-Ons Electronic Signature(s) Signed: 03/09/2023 9:28:36 AM By: Duanne Guess MD FACS Entered By: Duanne Guess on 03/09/2023  06:28:36 -------------------------------------------------------------------------------- Multi-Disciplinary Care Plan Details Patient Name: Date of Service: [name], [name] 03/09/2023 9:15 A M Medical Record Number: [id number] Patient Account Number: [id number] Date of Birth/Sex: Treating RN: [birth year] (67 y.o. [name] Primary Care Khaleed Holan: Arva Chafe Other Clinician: Referring Cordelro Gautreau: Treating Dontravious Camille/Extender: [name] in Treatment: 27 Multidisciplinary Care Plan reviewed with physician Active Inactive Abuse / Safety / Falls / Self Care Management Nursing Diagnoses: Impaired physical mobility Potential for falls Goals: Patient will not experience any injury related to falls Date Initiated: 08/31/2022 Target Resolution Date: 04/21/2023 Goal Status: Active Patient/caregiver will verbalize/demonstrate measures taken to improve the patient's personal safety Date Initiated: 08/31/2022 Target Resolution Date: 04/21/2023 Goal Status: Active Interventions: Provide education on basic hygiene Provide education on fall prevention Notes: Electronic Signature(s) Signed: 03/09/2023 4:21:24 PM By: Samuella Bruin Entered By: Samuella Bruin on 03/09/2023 06:08:00 Northrup, Tyrease A ([id number]) [id number] Page 5 of 8 -------------------------------------------------------------------------------- Pain Assessment Details Patient Name: Date of Service: [place], [name] 03/09/2023 9:15  A M Medical Record Number: [id number] Patient Account Number: [id number] Date of Birth/Sex: Treating RN: 1956-01-20 (67 y.o. [name] Primary Care Markeise Mathews: Arva Chafe Other Clinician: Referring Zaydrian Batta: Treating Kingslee Dowse/Extender: [name] in Treatment: 27 Active Problems Location of Pain Severity and Description of Pain Patient Has Paino No Site Locations Rate the  pain. Current Pain Level: 0 Pain Management and Medication Current Pain Management: Electronic Signature(s) Signed: 03/09/2023 4:21:24 PM By: Samuella Bruin Entered By: Samuella Bruin on 03/09/2023 05:56:07 -------------------------------------------------------------------------------- Patient/Caregiver Education Details Patient Name: Date of Service: Karie Georges 8/20/2024andnbsp9:15 A M Medical Record Number: [id number] Patient Account Number: [id number] Date of Birth/Gender: Treating RN: 04-04-1956 (67 y.o. [name] Primary Care Physician: Arva Chafe Other Clinician: Referring Physician: Treating Physician/Extender: [name] in Treatment: 27 Education Assessment Education Provided To: Patient Education Topics Provided Wound/Skin Impairment: Methods: Explain/Verbal Responses: Reinforcements needed, State content correctly Tengan, Vahe A ([id number]) [id number] Page 6 of 8 Electronic Signature(s) Signed: 03/09/2023 4:21:24 PM By: Samuella Bruin Entered By: Samuella Bruin on 03/09/2023 06:08:10 -------------------------------------------------------------------------------- Wound Assessment Details Patient Name: Date of Service: [name], [name] 03/09/2023 9:15 A M Medical Record Number: [id number] Patient Account Number: [id number] Date of Birth/Sex: Treating RN: 03/21/1956 (67 y.o. [name] Primary Care Moesha Sarchet: Arva Chafe Other Clinician: Referring Rafeef Lau: Treating Lareta Bruneau/Extender: [name] in Treatment: 27 Wound Status Wound Number: 2 Primary Diabetic Wound/Ulcer of the Lower Extremity Etiology: Wound Location: Right, Plantar Foot Wound Open Wounding Event: Gradually Appeared Status: Date Acquired: 07/08/2022 Comorbid Sleep Apnea, Arrhythmia, Coronary Artery Disease, Deep Vein Weeks Of Treatment: 27 History:  Thrombosis, Hypertension, Myocardial Infarction, Peripheral Arterial Clustered Wound: No Disease, Type II Diabetes, Neuropathy Photos Wound Measurements Length: (cm) 0.8 Width: (cm) 0.6 Depth: (cm) 0.1 Area: (cm) 0.377 Volume: (cm) 0.038 % Reduction in Area: 85.2% % Reduction in Volume: 85.1% Epithelialization: Medium (34-66%) Tunneling: No Undermining: No Wound Description Classification: Grade 1 Wound Margin: Distinct, outline attached Exudate Amount: Medium Exudate Type: Serosanguineous Exudate Color: red, brown Foul Odor After Cleansing: No Slough/Fibrino Yes Wound Bed Granulation Amount: Large (67-100%) Exposed Structure Granulation Quality: Red, Pink Fascia Exposed: No Necrotic Amount: Small (1-33%) Fat Layer (Subcutaneous Tissue) Exposed: Yes Necrotic Quality: Adherent Slough Tendon Exposed: No Muscle Exposed: No Joint Exposed: No Bone Exposed: No Periwound Skin Texture Texture Color No Abnormalities Noted: No No Abnormalities Noted: Yes Callus: Yes Temperature / Pain Temperature: No Abnormality Moisture Woodcox, Tynell A ([id number]) [id number] Page 7 of 8 No Abnormalities Noted: No Dry / Scaly: Yes Maceration: No Treatment Notes Wound #2 (Foot) Wound Laterality: Plantar, Right Cleanser Soap and Water Discharge Instruction: May shower and wash wound with dial antibacterial soap and water prior to dressing change. Wound Cleanser Discharge Instruction: Cleanse the wound with wound cleanser prior to applying a clean dressing using gauze sponges, not tissue or cotton balls. Peri-Wound Care Zinc Oxide Ointment 30g tube Discharge Instruction: Apply Zinc Oxide to periwound with each dressing change Topical Skintegrity Hydrogel 4 (oz) Discharge Instruction: Apply hydrogel as directed Primary Dressing ADAPTIC TOUCH 3x4.25 (in/in) Discharge Instruction: Apply to wound bed as instructed EPICORD Discharge Instruction: #6  (02/23/23) Secondary Dressing Woven Gauze Sponge, Non-Sterile 4x4 in Discharge Instruction: Apply over primary dressing as directed. Zetuvit Plus 4x8 in Discharge Instruction: Apply over primary dressing as directed. Secured With Elastic Bandage 4 inch (ACE bandage) Discharge Instruction: Secure with ACE bandage as directed. Kerlix Roll Sterile, 4.5x3.1 (in/yd) Discharge Instruction: Secure with Kerlix as directed.  Compression Wrap Compression Stockings Add-Ons Electronic Signature(s) Signed: 03/09/2023 4:21:24 PM By: Samuella Bruin Entered By: Samuella Bruin on 03/09/2023 06:04:28 -------------------------------------------------------------------------------- Vitals Details Patient Name: Date of Service: [name], [name] 03/09/2023 9:15 A M Medical Record Number: [id number] Patient Account Number: [id number] Date of Birth/Sex: Treating RN: 1956/06/04 (67 y.o. [name] Primary Care Leiann Sporer: Arva Chafe Other Clinician: Referring Kajsa Butrum: Treating Alondria Mousseau/Extender: [name] in Treatment: 27 Vital Signs Time Taken: 08:57 Temperature (F): 98 Height (in): 70 Pulse (bpm): 71 Weight (lbs): 260 Respiratory Rate (breaths/min): 18 Body Mass Index (BMI): 37.3 Blood Pressure (mmHg): 186/76 Wubben, Ario A ([id number]) [id number] Page 8 of 8 Reference Range: 80 - 120 mg / dl Electronic Signature(s) Signed: 03/09/2023 4:21:24 PM By: Samuella Bruin Entered By: Samuella Bruin on 03/09/2023 05:58:03

## 2023-03-10 ENCOUNTER — Ambulatory Visit (INDEPENDENT_AMBULATORY_CARE_PROVIDER_SITE_OTHER): Payer: Medicare HMO | Admitting: *Deleted

## 2023-03-10 DIAGNOSIS — Z0000 Encounter for general adult medical examination without abnormal findings: Secondary | ICD-10-CM | POA: Diagnosis not present
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
## 2023-03-10 NOTE — Patient Instructions (Signed)
[name] , Thank you for taking time to come for your Medicare Wellness Visit. I appreciate your ongoing commitment to your health goals. Please review the following plan we discussed and let me know if I can assist you in the future.     This is a list of the screening recommended for you and due dates:  Health Maintenance  Topic Date Due   Flu Shot  02/18/2023   Hemoglobin A1C  02/25/2023   Colon Cancer Screening  05/01/2023   COVID-19 Vaccine (5 - 2023-24 season) 06/19/2023*   Yearly kidney health urinalysis for diabetes  08/28/2023*   Yearly kidney function blood test for diabetes  09/16/2023*   Eye exam for diabetics  11/12/2023   Medicare Annual Wellness Visit  03/09/2024   DTaP/Tdap/Td vaccine (2 - Td or Tdap) 12/17/2028   Pneumonia Vaccine  Completed   Hepatitis C Screening  Completed   Zoster (Shingles) Vaccine  Completed   HPV Vaccine  Aged Out   Complete foot exam   Discontinued  *Topic was postponed. The date shown is not the original due date.    Next appointment: Follow up in one year for your annual wellness visit.   Preventive Care 69 Years and Older, Male Preventive care refers to lifestyle choices and visits with your health care provider that can promote health and wellness. What does preventive care include? A yearly physical exam. This is also called an annual well check. Dental exams once or twice a year. Routine eye exams. Ask your health care provider how often you should have your eyes checked. Personal lifestyle choices, including: Daily care of your teeth and gums. Regular physical activity. Eating a healthy diet. Avoiding tobacco and drug use. Limiting alcohol use. Practicing safe sex. Taking low doses of aspirin every day. Taking vitamin and mineral supplements as recommended by your health care provider. What happens during an annual well check? The services and screenings done by your health care provider during your annual well check will  depend on your age, overall health, lifestyle risk factors, and family history of disease. Counseling  Your health care provider may ask you questions about your: Alcohol use. Tobacco use. Drug use. Emotional well-being. Home and relationship well-being. Sexual activity. Eating habits. History of falls. Memory and ability to understand (cognition). Work and work Astronomer. Screening  You may have the following tests or measurements: Height, weight, and BMI. Blood pressure. Lipid and cholesterol levels. These may be checked every 5 years, or more frequently if you are over 3 years old. Skin check. Lung cancer screening. You may have this screening every year starting at age 50 if you have a 30-pack-year history of smoking and currently smoke or have quit within the past 15 years. Fecal occult blood test (FOBT) of the stool. You may have this test every year starting at age 39. Flexible sigmoidoscopy or colonoscopy. You may have a sigmoidoscopy every 5 years or a colonoscopy every 10 years starting at age 20. Prostate cancer screening. Recommendations will vary depending on your family history and other risks. Hepatitis C blood test. Hepatitis B blood test. Sexually transmitted disease (STD) testing. Diabetes screening. This is done by checking your blood sugar (glucose) after you have not eaten for a while (fasting). You may have this done every 1-3 years. Abdominal aortic aneurysm (AAA) screening. You may need this if you are a current or former smoker. Osteoporosis. You may be screened starting at age 17 if you are at high risk.  Talk with your health care provider about your test results, treatment options, and if necessary, the need for more tests. Vaccines  Your health care provider may recommend certain vaccines, such as: Influenza vaccine. This is recommended every year. Tetanus, diphtheria, and acellular pertussis (Tdap, Td) vaccine. You may need a Td booster every 10  years. Zoster vaccine. You may need this after age 51. Pneumococcal 13-valent conjugate (PCV13) vaccine. One dose is recommended after age 21. Pneumococcal polysaccharide (PPSV23) vaccine. One dose is recommended after age 79. Talk to your health care provider about which screenings and vaccines you need and how often you need them. This information is not intended to replace advice given to you by your health care provider. Make sure you discuss any questions you have with your health care provider. Document Released: 08/02/2015 Document Revised: 03/25/2016 Document Reviewed: 05/07/2015 Elsevier Interactive Patient Education  2017 [employer].  Fall Prevention in the Home Falls can cause injuries. They can happen to people of all ages. There are many things you can do to make your home safe and to help prevent falls. What can I do on the outside of my home? Regularly fix the edges of walkways and driveways and fix any cracks. Remove anything that might make you trip as you walk through a door, such as a raised step or threshold. Trim any bushes or trees on the path to your home. Use bright outdoor lighting. Clear any walking paths of anything that might make someone trip, such as rocks or tools. Regularly check to see if handrails are loose or broken. Make sure that both sides of any steps have handrails. Any raised decks and porches should have guardrails on the edges. Have any leaves, snow, or ice cleared regularly. Use sand or salt on walking paths during winter. Clean up any spills in your garage right away. This includes oil or grease spills. What can I do in the bathroom? Use night lights. Install grab bars by the toilet and in the tub and shower. Do not use towel bars as grab bars. Use non-skid mats or decals in the tub or shower. If you need to sit down in the shower, use a plastic, non-slip stool. Keep the floor dry. Clean up any water that spills on the floor as soon as it  happens. Remove soap buildup in the tub or shower regularly. Attach bath mats securely with double-sided non-slip rug tape. Do not have throw rugs and other things on the floor that can make you trip. What can I do in the bedroom? Use night lights. Make sure that you have a light by your bed that is easy to reach. Do not use any sheets or blankets that are too big for your bed. They should not hang down onto the floor. Have a firm chair that has side arms. You can use this for support while you get dressed. Do not have throw rugs and other things on the floor that can make you trip. What can I do in the kitchen? Clean up any spills right away. Avoid walking on wet floors. Keep items that you use a lot in easy-to-reach places. If you need to reach something above you, use a strong step stool that has a grab bar. Keep electrical cords out of the way. Do not use floor polish or wax that makes floors slippery. If you must use wax, use non-skid floor wax. Do not have throw rugs and other things on the floor that can make  you trip. What can I do with my stairs? Do not leave any items on the stairs. Make sure that there are handrails on both sides of the stairs and use them. Fix handrails that are broken or loose. Make sure that handrails are as long as the stairways. Check any carpeting to make sure that it is firmly attached to the stairs. Fix any carpet that is loose or worn. Avoid having throw rugs at the top or bottom of the stairs. If you do have throw rugs, attach them to the floor with carpet tape. Make sure that you have a light switch at the top of the stairs and the bottom of the stairs. If you do not have them, ask someone to add them for you. What else can I do to help prevent falls? Wear shoes that: Do not have high heels. Have rubber bottoms. Are comfortable and fit you well. Are closed at the toe. Do not wear sandals. If you use a stepladder: Make sure that it is fully opened.  Do not climb a closed stepladder. Make sure that both sides of the stepladder are locked into place. Ask someone to hold it for you, if possible. Clearly Ehsan and make sure that you can see: Any grab bars or handrails. First and last steps. Where the edge of each step is. Use tools that help you move around (mobility aids) if they are needed. These include: Canes. Walkers. Scooters. Crutches. Turn on the lights when you go into a dark area. Replace any light bulbs as soon as they burn out. Set up your furniture so you have a clear path. Avoid moving your furniture around. If any of your floors are uneven, fix them. If there are any pets around you, be aware of where they are. Review your medicines with your doctor. Some medicines can make you feel dizzy. This can increase your chance of falling. Ask your doctor what other things that you can do to help prevent falls. This information is not intended to replace advice given to you by your health care provider. Make sure you discuss any questions you have with your health care provider. Document Released: 05/02/2009 Document Revised: 12/12/2015 Document Reviewed: 08/10/2014 Elsevier Interactive Patient Education  2017 [employer].

## 2023-03-16 ENCOUNTER — Encounter (HOSPITAL_BASED_OUTPATIENT_CLINIC_OR_DEPARTMENT_OTHER): Payer: Medicare HMO | Admitting: General Surgery

## 2023-03-16 DIAGNOSIS — I4891 Unspecified atrial fibrillation: Secondary | ICD-10-CM | POA: Diagnosis not present

## 2023-03-16 DIAGNOSIS — L97512 Non-pressure chronic ulcer of other part of right foot with fat layer exposed: Secondary | ICD-10-CM | POA: Diagnosis not present

## 2023-03-16 DIAGNOSIS — G4733 Obstructive sleep apnea (adult) (pediatric): Secondary | ICD-10-CM | POA: Diagnosis not present

## 2023-03-16 DIAGNOSIS — I252 Old myocardial infarction: Secondary | ICD-10-CM | POA: Diagnosis not present

## 2023-03-16 DIAGNOSIS — E11621 Type 2 diabetes mellitus with foot ulcer: Secondary | ICD-10-CM | POA: Diagnosis not present

## 2023-03-16 DIAGNOSIS — E1151 Type 2 diabetes mellitus with diabetic peripheral angiopathy without gangrene: Secondary | ICD-10-CM | POA: Diagnosis not present

## 2023-03-16 DIAGNOSIS — Z951 Presence of aortocoronary bypass graft: Secondary | ICD-10-CM | POA: Diagnosis not present

## 2023-03-16 DIAGNOSIS — I2510 Atherosclerotic heart disease of native coronary artery without angina pectoris: Secondary | ICD-10-CM | POA: Diagnosis not present

## 2023-03-16 DIAGNOSIS — Z8673 Personal history of transient ischemic attack (TIA), and cerebral infarction without residual deficits: Secondary | ICD-10-CM | POA: Diagnosis not present

## 2023-03-16 DIAGNOSIS — Z85828 Personal history of other malignant neoplasm of skin: Secondary | ICD-10-CM | POA: Diagnosis not present

## 2023-03-16 NOTE — Progress Notes (Signed)
[name], [name] [phone] [id number] [id number] Page 1 of 8 Visit Report for 03/16/2023 Arrival Information Details Patient Name: Date of Service: [name], [name]. 03/16/2023 9:15 [name] Medical Record Number: [id number] Patient Account Number: [id number] Date of Birth/Sex: Treating RN: 1956-01-01 (67 y.o. M) Primary Care Syndi Pua: Arva Chafe Other Clinician: Referring Coren Sagan: Treating Eriel Dunckel/Extender: [name] in Treatment: 28 Visit Information History Since Last Visit All ordered tests and consults were completed: No Patient Arrived: Ambulatory Added or deleted any medications: No Arrival Time: 09:08 Any new allergies or adverse reactions: No Accompanied By: girlfriend Had [name] fall or experienced change in No Transfer Assistance: None activities of daily living that may affect Patient Identification Verified: Yes risk of falls: Secondary Verification Process Completed: Yes Signs or symptoms of abuse/neglect since last visito No Patient Requires Transmission-Based Precautions: No Hospitalized since last visit: No Patient Has Alerts: No Implantable device outside of the clinic excluding No cellular tissue based products placed in the center since last visit: Pain Present Now: No Electronic Signature(s) Signed: 03/16/2023 9:40:43 AM By: Dayton Scrape Entered By: Dayton Scrape on 03/16/2023 09:08:43 -------------------------------------------------------------------------------- Encounter Discharge Information Details Patient Name: Date of Service: [name], [name]. 03/16/2023 9:15 [name] Medical Record Number: [id number] Patient Account Number: [id number] Date of Birth/Sex: Treating RN: 11/09/1955 (67 y.o. [name] Primary Care Fortunato Nordin: Arva Chafe Other Clinician: Referring Jessalyn Hinojosa: Treating Elzada Pytel/Extender: [name] in Treatment: 28 Encounter Discharge Information Items Post  Procedure Vitals Discharge Condition: Stable Temperature (F): 97.6 Ambulatory Status: Ambulatory Pulse (bpm): 43 Discharge Destination: Home Respiratory Rate (breaths/min): 18 Transportation: Private Auto Blood Pressure (mmHg): 174/69 Accompanied By: self Schedule Follow-up Appointment: Yes Clinical Summary of Care: Patient Declined Electronic Signature(s) Signed: 03/16/2023 2:51:26 PM By: Samuella Bruin Entered By: Samuella Bruin on 03/16/2023 09:43:12 [name], [name] ([id number]) [id number] Page 2 of 8 -------------------------------------------------------------------------------- Lower Extremity Assessment Details Patient Name: Date of Service: [name], [name]. 03/16/2023 9:15 [name] Medical Record Number: [id number] Patient Account Number: [id number] Date of Birth/Sex: Treating RN: 1956-04-03 (67 y.o. [name] Primary Care Deyna Carbon: Arva Chafe Other Clinician: Referring Melba Araki: Treating Lacheryl Niesen/Extender: [name] in Treatment: 28 Edema Assessment Assessed: [name]: No] [Right: No] Edema: [Left: Ye] [Right: s] Calf Left: Right: Point of Measurement: From Medial Instep 38.3 cm Ankle Left: Right: Point of Measurement: From Medial Instep 23.9 cm Vascular Assessment Extremity colors, hair growth, and conditions: Extremity Color: [Right:Normal] Hair Growth on Extremity: [Right:Yes] Temperature of Extremity: [Right:Warm] Capillary Refill: [Right:< 3 seconds] Dependent Rubor: [Right:No No] Electronic Signature(s) Signed: 03/16/2023 2:51:26 PM By: Samuella Bruin Entered By: Samuella Bruin on 03/16/2023 09:13:51 -------------------------------------------------------------------------------- Multi Wound Chart Details Patient Name: Date of Service: [name], [name]. 03/16/2023 9:15 [name] Medical Record Number: [id number] Patient Account Number: [id number] Date of Birth/Sex: Treating  RN: Mar 15, 1956 (67 y.o. 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 Primary Etiology: Extremity Sleep Apnea, Arrhythmia, Coronary [name] [name] Comorbid History: Artery Disease, Deep Vein Thrombosis, Hypertension, Myocardial Infarction, Peripheral Arterial Disease, Type II Diabetes, Neuropathy 07/08/2022 [name] [name] Date Acquired: 28 [name] [name] Weeks of Treatment: Open [name] [name] Wound Status: No [name] [name] Wound Recurrence:  0.6x0.6x0.1 [name] [name] Measurements L x W x D (cm) 0.283 [name] [name] [name] : rea 0.028 [name] [name] Volume (cm) : 88.90% [name] [name] % Reduction in [name] rea: 89.00% [name] [name] % Reduction in Volume: Grade 1 [name] [name] Classification: Medium [name] [name] Exudate [name] mount: Serosanguineous [name] [name] Exudate Type: red, brown [name] [name] Exudate Color: Distinct, outline attached [name] [name] Wound Margin: Large (67-100%) [name] [name] Granulation [name] mount: Red, Pink [name] [name] Granulation Quality: Small (1-33%) [name] [name] Necrotic [name] mount: Fat Layer (Subcutaneous Tissue): Yes [name] [name] Exposed Structures: Fascia: No Tendon: No Muscle: No Joint: No Bone: No Medium (34-66%) [name] [name] Epithelialization: Debridement - Selective/Open Wound [name] [name] Debridement: Pre-procedure Verification/Time Out 09:35 [name] [name] Taken: Lidocaine 4% T opical Solution [name] [name] Pain Control: Callus, Slough [name] [name] Tissue Debrided: Skin/Epidermis [name] [name] Level: 0.28 [name] [name] Debridement [name] (sq cm): rea Curette [name] [name] Instrument: Minimum [name] [name] Bleeding: Pressure [name]  [name] Hemostasis [name] chieved: Procedure was tolerated well [name] [name] Debridement Treatment Response: 0.6x0.6x0.1 [name] [name] Post Debridement Measurements L x W x D (cm) 0.028 [name] [name] Post Debridement Volume: (cm) Callus: Yes [name] [name] Periwound Skin Texture: Dry/Scaly: Yes [name] [name] Periwound Skin Moisture: Maceration: No No Abnormalities Noted [name] [name] Periwound Skin Color: No Abnormality [name] [name] Temperature: Cellular or Tissue Based Product [name] [name] Procedures Performed: Debridement Treatment Notes Wound #2 (Foot) Wound Laterality: Plantar, Right Cleanser Soap and Water Discharge Instruction: May shower and wash wound with dial antibacterial soap and water prior to dressing change. Wound Cleanser Discharge Instruction: Cleanse the wound with wound cleanser prior to applying [name] clean dressing using gauze sponges, not tissue or cotton balls. Peri-Wound Care Zinc Oxide Ointment 30g tube Discharge Instruction: Apply Zinc Oxide to periwound with each dressing change Topical Skintegrity Hydrogel 4 (oz) Discharge Instruction: Apply hydrogel as directed Primary Dressing ADAPTIC TOUCH 3x4.25 (in/in) Discharge Instruction: Apply to wound bed as instructed [name], [name] ([id number]) [id number] Page 4 of 8 EPICORD Discharge Instruction: #6 (02/23/23) Secondary Dressing Woven Gauze Sponge, Non-Sterile 4x4 in Discharge Instruction: Apply over primary dressing as directed. Zetuvit Plus 4x8 in Discharge Instruction: Apply over primary dressing as directed. Secured With Elastic Bandage 4 inch (ACE bandage) Discharge Instruction: Secure with ACE bandage as directed. Kerlix Roll Sterile, 4.5x3.1 (in/yd) Discharge Instruction: Secure with Kerlix as directed. Compression Wrap Compression Stockings Add-Ons Electronic Signature(s) Signed: 03/16/2023 10:05:54 AM By: Duanne Guess MD FACS Entered By: Duanne Guess on 03/16/2023  10:05:54 -------------------------------------------------------------------------------- Multi-Disciplinary Care Plan Details Patient Name: Date of Service: [name], [name]. 03/16/2023 9:15 [name] Medical Record Number: [id number] Patient Account Number: [id number] Date of Birth/Sex: Treating RN: 1956-03-05 (67 y.o. [name] Primary Care Michiko Lineman: Arva Chafe Other Clinician: Referring Mykael Trott: Treating Zayda Angell/Extender: [name] in Treatment: 28 Multidisciplinary Care Plan reviewed with physician Active Inactive Abuse / Safety / Falls / Self Care Management Nursing Diagnoses: Impaired physical mobility Potential for falls Goals: Patient will not experience any injury related to falls Date Initiated: 08/31/2022 Target Resolution Date: 04/21/2023 Goal Status: Active Patient/caregiver will verbalize/demonstrate measures taken to improve the patient's personal safety Date Initiated: 08/31/2022 Target Resolution Date: 04/21/2023 Goal Status: Active Interventions: Provide education on basic hygiene Provide education on fall prevention Notes: Electronic Signature(s) Signed: 03/16/2023 2:51:26 PM By: Samuella Bruin Entered By: Samuella Bruin on 03/16/2023 09:22:53 [name], [name] ([id number]) [id number] Page 5 of 8 -------------------------------------------------------------------------------- Pain Assessment Details Patient Name: Date of Service: [name], [name]. 03/16/2023 9:15 [name]  M Medical Record Number: [id number] Patient Account Number: [id number] Date of Birth/Sex: Treating RN: 1956-01-30 (67 y.o. M) Primary Care Joffre Lucks: Arva Chafe Other Clinician: Referring Jemuel Laursen: Treating Anabelle Bungert/Extender: [name] in Treatment: 28 Active Problems Location of Pain Severity and Description of Pain Patient Has Paino No Site Locations Pain Management and  Medication Current Pain Management: Electronic Signature(s) Signed: 03/16/2023 9:40:43 AM By: Dayton Scrape Entered By: Dayton Scrape on 03/16/2023 09:09:11 -------------------------------------------------------------------------------- Patient/Caregiver Education Details Patient Name: Date of Service: [name] 8/27/2024andnbsp9:15 [name] Medical Record Number: [id number] Patient Account Number: [id number] Date of Birth/Gender: Treating RN: 1956/04/12 (67 y.o. [name] Primary Care Physician: Arva Chafe Other Clinician: Referring Physician: Treating Physician/Extender: [name] in Treatment: 28 Education Assessment Education Provided To: Patient Education Topics Provided Wound/Skin Impairment: Methods: Explain/Verbal Responses: Reinforcements needed, State content correctly [name], [name] ([id number]) [id number] Page 6 of 8 Electronic Signature(s) Signed: 03/16/2023 2:51:26 PM By: Samuella Bruin Entered By: Samuella Bruin on 03/16/2023 09:23:09 -------------------------------------------------------------------------------- Wound Assessment Details Patient Name: Date of Service: [name], [name]. 03/16/2023 9:15 [name] Medical Record Number: [id number] Patient Account Number: [id number] Date of Birth/Sex: Treating RN: 1956-01-20 (67 y.o. [name] Primary Care Meagon Duskin: Arva Chafe Other Clinician: Referring [name]: Treating Audriana Aldama/Extender: [name] in Treatment: 28 Wound Status Wound Number: 2 Primary Diabetic Wound/Ulcer of the Lower Extremity Etiology: Wound Location: Right, Plantar Foot Wound Open Wounding Event: Gradually Appeared Status: Date Acquired: 07/08/2022 Comorbid Sleep Apnea, Arrhythmia, Coronary Artery Disease, Deep Vein Weeks Of Treatment: 28 History: Thrombosis, Hypertension, Myocardial Infarction, Peripheral  Arterial Clustered Wound: No Disease, Type II Diabetes, Neuropathy Photos Wound Measurements Length: (cm) 0.6 Width: (cm) 0.6 Depth: (cm) 0.1 Area: (cm) 0.283 Volume: (cm) 0.028 % Reduction in Area: 88.9% % Reduction in Volume: 89% Epithelialization: Medium (34-66%) Tunneling: No Undermining: No Wound Description Classification: Grade 1 Wound Margin: Distinct, outline attached Exudate Amount: Medium Exudate Type: Serosanguineous Exudate Color: red, brown Foul Odor After Cleansing: No Slough/Fibrino Yes Wound Bed Granulation Amount: Large (67-100%) Exposed Structure Granulation Quality: Red, Pink Fascia Exposed: No Necrotic Amount: Small (1-33%) Fat Layer (Subcutaneous Tissue) Exposed: Yes Necrotic Quality: Adherent Slough Tendon Exposed: No Muscle Exposed: No Joint Exposed: No Bone Exposed: No Periwound Skin Texture Texture Color No Abnormalities Noted: No No Abnormalities Noted: Yes Callus: Yes Temperature / Pain Temperature: No Abnormality Moisture [name], [name] ([id number]) [id number] Page 7 of 8 No Abnormalities Noted: No Dry / Scaly: Yes Maceration: No Treatment Notes Wound #2 (Foot) Wound Laterality: Plantar, Right Cleanser Soap and Water Discharge Instruction: May shower and wash wound with dial antibacterial soap and water prior to dressing change. Wound Cleanser Discharge Instruction: Cleanse the wound with wound cleanser prior to applying [name] clean dressing using gauze sponges, not tissue or cotton balls. Peri-Wound Care Zinc Oxide Ointment 30g tube Discharge Instruction: Apply Zinc Oxide to periwound with each dressing change Topical Skintegrity Hydrogel 4 (oz) Discharge Instruction: Apply hydrogel as directed Primary Dressing ADAPTIC TOUCH 3x4.25 (in/in) Discharge Instruction: Apply to wound bed as instructed EPICORD Discharge Instruction: #6 (02/23/23) Secondary Dressing Woven Gauze Sponge, Non-Sterile 4x4 in Discharge  Instruction: Apply over primary dressing as directed. Zetuvit Plus 4x8 in Discharge Instruction: Apply over primary dressing as directed. Secured With Elastic Bandage 4 inch (ACE bandage) Discharge Instruction: Secure with ACE bandage as directed. Kerlix Roll Sterile, 4.5x3.1 (in/yd) Discharge Instruction: Secure with Kerlix as directed. Compression Wrap Compression Stockings Add-Ons Electronic Signature(s) Signed: 03/16/2023 2:51:26  PM By: Gelene Mink By: Samuella Bruin on 03/16/2023 09:15:14 -------------------------------------------------------------------------------- Vitals Details Patient Name: Date of Service: [name], [name]. 03/16/2023 9:15 [name] Medical Record Number: [id number] Patient Account Number: [id number] Date of Birth/Sex: Treating RN: [birth year] (67 y.o. M) Primary Care Tally Mckinnon: Arva Chafe Other Clinician: Referring Reinette Cuneo: Treating Leverne Amrhein/Extender: [name] in Treatment: 28 Vital Signs Time Taken: 09:08 Temperature (F): 97.6 Height (in): 70 Pulse (bpm): 43 Weight (lbs): 260 Respiratory Rate (breaths/min): 18 Body Mass Index (BMI): 37.3 Blood Pressure (mmHg): 174/69 [name], Kristoff [name] ([id number]) [id number] Page 8 of 8 Reference Range: 80 - 120 mg / dl Electronic Signature(s) Signed: 03/16/2023 9:40:43 AM By: Dayton Scrape Entered By: Dayton Scrape on 03/16/2023 09:09:04

## 2023-03-16 NOTE — Progress Notes (Signed)
[name] ([id number]) [id number] Page 1 of 12 Visit Report for 03/16/2023 Chief Complaint Document Details Patient Name: Date of Service: [name], [name] [name]. 03/16/2023 9:15 [name] Medical Record Number: [id number] Patient Account Number: [id number] Date of Birth/Sex: Treating RN: February 01, 1956 (67 y.o. M) Primary Care Provider: Arva Chafe Other Clinician: Referring Provider: Treating Provider/Extender: [name] in Treatment: 28 Information Obtained from: Patient Chief Complaint 05/14/2020; patient is here for review of an abrasion injury on the left lateral calf 08/31/2022: [name] (1st metatarsal base, plantar) Electronic Signature(s) Signed: 03/16/2023 10:06:00 AM By: Duanne Guess MD FACS Entered By: Duanne Guess on 03/16/2023 10:06:00 -------------------------------------------------------------------------------- Cellular or Tissue Based Product Details Patient Name: Date of Service: [name]. 03/16/2023 9:15 [name] Medical Record Number: [id number] Patient Account Number: [id number] Date of Birth/Sex: Treating RN: 04-Aug-1955 (67 y.o. [name] Primary Care Provider: Arva Chafe Other Clinician: Referring Provider: Treating Provider/Extender: [name] in Treatment: 28 Cellular or Tissue Based Product Type Wound #2 Right,Plantar Foot Applied to: Performed By: Physician Duanne Guess, MD Cellular or Tissue Based Product Type: Epicord Level of Consciousness (Pre-procedure): Awake and Alert Pre-procedure Verification/Time Out Yes - 09:36 Taken: Location: genitalia / hands / feet / multiple digits Wound Size (sq cm): 0.36 Product Size (sq cm): 6 Waste Size (sq cm): 3 Waste Reason: wound size Amount of Product Applied (sq cm): 3 Instrument Used: Forceps, Scissors Lot #: [id number] Expiration Date: 09/18/2027 Fenestrated: No Reconstituted:  Yes Solution Type: normal saline Solution Amount: 4 ml Lot #: [id number] KS Solution Expiration Date: 08/10/2024 Secured: Yes Secured With: Steri-Strips Dressing Applied: Yes Primary Dressing: adaptic Procedural Pain: 0 Post Procedural Pain: 0 [name] ([id number]) [phone].pdf Page 2 of 12 Response to Treatment: Procedure was tolerated well Level of Consciousness (Post- Awake and Alert procedure): Post Procedure Diagnosis Same as Pre-procedure Electronic Signature(s) Signed: 03/16/2023 12:43:24 PM By: Duanne Guess MD FACS Signed: 03/16/2023 2:51:26 PM By: Samuella Bruin Entered By: Samuella Bruin on 03/16/2023 09:42:20 -------------------------------------------------------------------------------- Debridement Details Patient Name: Date of Service: [name] [name]. 03/16/2023 9:15 [name] Medical Record Number: [id number] Patient Account Number: [id number] Date of Birth/Sex: Treating RN: [birth year] (67 y.o. [name] Primary Care Provider: Arva Chafe Other Clinician: Referring Provider: Treating Provider/Extender: [name] in Treatment: 28 Debridement Performed for Assessment: Wound #2 Right,Plantar Foot Performed By: Physician Duanne Guess, MD Debridement Type: Debridement Severity of Tissue Pre Debridement: Fat layer exposed Level of Consciousness (Pre-procedure): Awake and Alert Pre-procedure Verification/Time Out Yes - 09:35 Taken: Start Time: 09:35 Pain Control: Lidocaine 4% Topical Solution Percent of Wound Bed Debrided: 100% T Area Debrided (cm): otal 0.28 Tissue and other material debrided: Non-Viable, Callus, Slough, Skin: Epidermis, Slough Level: Skin/Epidermis Debridement Description: Selective/Open Wound Instrument: Curette Bleeding: Minimum Hemostasis Achieved: Pressure Response to Treatment: Procedure was tolerated well Level of Consciousness (Post- Awake and  Alert procedure): Post Debridement Measurements of Total Wound Length: (cm) 0.6 Width: (cm) 0.6 Depth: (cm) 0.1 Volume: (cm) 0.028 Character of Wound/Ulcer Post Debridement: Improved Severity of Tissue Post Debridement: Fat layer exposed Post Procedure Diagnosis Same as Pre-procedure Electronic Signature(s) Signed: 03/16/2023 12:43:24 PM By: Duanne Guess MD FACS Signed: 03/16/2023 2:51:26 PM By: Samuella Bruin Entered By: Samuella Bruin on 03/16/2023 09:42:30 [name], [name] ([id number]) [id number] Page 3 of 12 -------------------------------------------------------------------------------- HPI Details Patient Name: Date of Service: [name], [name]. 03/16/2023 9:15 [name] Medical Record Number: [id number] Patient Account Number: [id number] Date of  Birth/Sex: Treating RN: 17-May-1956 (67 y.o. M) Primary Care Provider: Arva Chafe Other Clinician: Referring Provider: Treating Provider/Extender: [name] in Treatment: 28 History of Present Illness HPI Description: ADMISSION 05/14/2020; this is [name] with multiple medical issues. Predominantly he has type 2 diabetes with [name] and also history of fairly significant PAD. He had [name] in February 2017 he also had an atherectomy and angioplasty by Dr. [name] of the right popliteal artery in 2016. He is supposed to be getting arterial studies annually however this was interrupted last year because of the pandemic. He tells [place] he was at [facility] 2 weeks ago was getting out of of the scooter and traumatized his left lateral lower leg. There was [name] as the patient is on Plavix and Eliquis. They have been dressing this with Neosporin and doing [name] fairly good job. Wound measures 2.5 x 3.5 it does not have any depth he does not have [name] in his legs outside of  surgery however he does have chronic edema and skin changes suggestive of chronic venous disease possibly some degree of lymphedema as well. Past medical history includes type 2 diabetes with peripheral neuropathy and gait instability, lumbar spondylosis, obstructive sleep apnea, history of [name], basal cell skin cancer, atrial fibrillation on anticoagulation, significant PAD as noted with [name] and [name] in 30th 2016. He also has [name] of coronary artery disease with an MI in 2002 hypertension hyperlipidemia and heart failure with preserved ejection fraction His last arterial studies I can see in epic were on 03/10/2018 this showed [name] and [name] with monophasic waveforms on the right. On the left his ABI was 1.20 with [name] and triphasic waveforms. He has not had arterial studies since. Our nurse in the clinic got an ABI on the left of 1.1 11/2; left anterior leg wound in the setting of chronic venous insufficiency. Wound was initially trauma. We have been using Hydrofera Blue under compression he has home health.. The wound looks [name] lot better today with improvement in surface area 11/16; left anterior leg wound in the setting of chronic venous insufficiency. Wound was initially trauma. We have been using Hydrofera Blue under compression. The patient is closed today. He is supposed to follow-up with vein and vascular with regards to arterial insufficiency nevertheless his leg wounds are 12/3; apparently 2 weeks ago when they were putting on their stockings they managed to get 3 wounds on the left anterior lower leg from abrasion when putting on the stockings. Home health came by the week of Thanksgiving and put Hydrofera Blue 4-layer wrap on this and there is only one superficial area remaining. The patient and his wife complained  about the difficulties getting stockings on I think we are using 20/30. We will order bilateral external compression stockings which should be easier. 12/10; wound on the left anterior lower leg is closed. He has chronic venous insufficiency we ordered him Farrow wrap stockings unfortunately he did not bring these in. 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 He says that it has been present since mid December. He is currently residing in [place] until March and [name]  podiatrist has been looking after him there. They have been simply painting the area with Betadine. He has not had any lower extremity arterial studies since 2019, at which time his right ABI was 0.69. Measured in clinic today, it was 0.71. He is not aware of his most recent hemoglobin A1c, but historically he has had exceptionally poor control. On the basis of his right first metatarsal, there is [name] small crescent shaped wound. There is surrounding eschar and callus. There is no malodor or purulent drainage. 09/08/2022: The original wound is smaller today and fairly clean, but there is some discoloration and [name] pulpy texture to the adjacent callus. Underneath this, the tissue is open exposing the fat layer. It looks like perhaps there was [name] crack in the callus and moisture got under the skin and caused breakdown. 09/15/2022: There has been more moisture related tissue breakdown. The callus is very soft and the underlying tissue is more open. 09/28/2022: The wound looks much better. He has done [name] good job keeping it dry. There is some callus overlying much of the wound surface. There is slough on the exposed open areas. 10/14/2022: There has been [name] lot of moisture related tissue breakdown. He is still forming callus over the top and then it seems that moisture gets underneath the callus and causes tissue damage. There is slough on the exposed wound surface. They will be  [place] back [place] this [place]. 10/21/2022: His foot is less wet, but there is no significant change to his wound. He has developed [name] blister on his left anterior tibial surface. There is no open wound here, but he does have some fairly significant edema. We are planning to apply [name] total contact cast today. 10/23/2022: Here for his obligatory first cast change. He says he has not had any issues wearing the cast or walking in the boot. No detrimental effects on his wound. 10/29/2022: The wound is looking better. Clearly, putting him in the cast has prevented water from getting in under his callus and causing further tissue breakdown. The blister on his left anterior tibial surface has not yet ruptured. Edema control is significantly improved. 11/05/2022: The wound on his right plantar foot is getting better. There is still some callus accumulation around the wounds, but there are just 2 open areas now, [name] very small 1 on the medial aspect of his metatarsal head and [name] little bit larger 1 on the plantar surface. The blister on his left anterior tibial surface is now open with hanging dry skin. 11/12/2022: The left anterior tibial wound is closed. The wound on his right plantar foot is smaller with just [name] little bit of callus around the edges. 11/19/2022: The right plantar foot wound continues to contract. The surface is quite clean. The tiny satellite area on the medial aspect of his foot has closed. 11/26/2022: The wound is smaller again today. He is responding well to the offloading effects of total contact casting. 12/03/2022: The wound is about the same size today. There is [name] little bit of moisture around the perimeter of the wound. No significant tissue breakdown. 12/10/2022: The wound is smaller today. There has been no moisture-related tissue breakdown of any maceration. 12/21/2022: The wound continues to contract. Moisture control is excellent. [name], [name] ([id number])  [id number] Page 4 of 12 12/28/2022: The wound is [name] little bit smaller today. 01/05/2023: The wound measures smaller today. Excellent moisture control with zero evidence of maceration. The wound surface  is clean with healthy granulation tissue. 01/13/2023: We are leaving his Apligraf in situ for [name] second week. There is no discolored drainage or malodor coming from the site. 01/19/2023: The wound is smaller today. The periwound skin is in good condition without tissue maceration and the healed areas of tissue appear to have improved integrity. 01/27/2023: Continued contraction of the wound. No tissue maceration. The wound surface is flush with the surrounding skin. 02/02/2023: The wound measured [name] little bit smaller today. There is some bruising just adjacent to the wound, but this has not resulted in any tissue breakdown. 02/09/2023: The wound is smaller again today. The area of bruising that was seen last week has resolved and there has been no tissue damage as [name] result. There is some callus accumulation around the wound edges along with minimal soft slough on the surface. 02/16/2023: The wound measured [name] little bit larger today. There is [name] little bit of maceration around the wound edges, which is likely responsible. 02/23/2023: The wound is smaller today and there is epithelium encroaching around the edges. There is no periwound tissue maceration, nor has he accumulated any significant callus. 03/03/2023: His wound continues to contract. There is no maceration nor any significant callus. There is slight slough on the wound surface. 03/09/2023: The wound is [name] little bit smaller again today. He is managing very well in the peg assist offloading insert and there has been no tissue breakdown or periwound maceration as [name] result of being out of the total contact cast. 03/16/2023: The wound measured smaller again today. He continues to do well using the peg assist offloading  insert. Electronic Signature(s) Signed: 03/16/2023 10:06:30 AM By: Duanne Guess MD FACS Entered By: Duanne Guess on 03/16/2023 10:06:30 -------------------------------------------------------------------------------- Physical Exam Details Patient Name: Date of Service: [name] [name]. 03/16/2023 9:15 [name] Medical Record Number: [id number] Patient Account Number: [id number] Date of Birth/Sex: Treating RN: 01-23-1956 (67 y.o. M) Primary Care Provider: Arva Chafe Other Clinician: Referring Provider: Treating Provider/Extender: [name] in Treatment: 28 Constitutional Hypertensive, asymptomatic. Bradycardic, asymptomatic. . . no acute distress. Respiratory Normal work of breathing on room air. Notes 03/16/2023: The wound is [name] little bit smaller today. There is some slough buildup on the surface and some periwound callus accumulation. Electronic Signature(s) Signed: 03/16/2023 10:07:28 AM By: Duanne Guess MD FACS Entered By: Duanne Guess on 03/16/2023 10:07:27 -------------------------------------------------------------------------------- Physician Orders Details Patient Name: Date of Service: [name] [name]. 03/16/2023 9:15 [name] Medical Record Number: [id number] Patient Account Number: [id number] Date of Birth/Sex: Treating RN: Jul 07, 1956 (67 y.o. [name] Primary Care Provider: Arva Chafe Other Clinician: Referring Provider: Treating Provider/Extender: Ocie Doyne Gabbs, [name] ([id number]) [phone].pdf Page 5 of 12 Weeks in Treatment: 28 Verbal / Phone Orders: No Diagnosis Coding ICD-10 Coding Code Description L97.512 Non-pressure chronic ulcer of other part of right foot with fat layer exposed I73.9 Peripheral vascular disease, unspecified I25.10 Atherosclerotic heart disease of native coronary artery without angina pectoris I10 Essential (primary)  hypertension E11.65 Type 2 diabetes mellitus with hyperglycemia E11.40 Type 2 diabetes mellitus with diabetic neuropathy, unspecified I63.9 Cerebral infarction, unspecified Follow-up Appointments ppointment in 1 week. - Dr. Lady Gary - room 2 Return [name] Anesthetic (In clinic) Topical Lidocaine 4% applied to wound bed Cellular or Tissue Based Products Wound #2 Right,Plantar Foot Cellular or Tissue Based Product Type: - Epicord #9 Cellular or Tissue Based Product applied to wound bed, secured with steri-strips, cover with Adaptic or Mepitel. (DO  NOT REMOVE). Bathing/ Shower/ Hygiene May shower with protection but do not get wound dressing(s) wet. Protect dressing(s) with water repellant cover (for example, large plastic bag) or [name] cast cover and may then take shower. Edema Control - Lymphedema / SCD / Other Elevate legs to the level of the heart or above for 30 minutes daily and/or when sitting for 3-4 times [name] day throughout the day. Avoid standing for long periods of time. Patient to wear own compression stockings every day. Moisturize legs daily. Off-Loading Open toe surgical shoe to: - with peg assist insert to right foot Wound Treatment Wound #2 - Foot Wound Laterality: Plantar, Right Cleanser: Soap and Water 1 x Per Week/30 Days Discharge Instructions: May shower and wash wound with dial antibacterial soap and water prior to dressing change. Cleanser: Wound Cleanser 1 x Per Week/30 Days Discharge Instructions: Cleanse the wound with wound cleanser prior to applying [name] clean dressing using gauze sponges, not tissue or cotton balls. Peri-Wound Care: Zinc Oxide Ointment 30g tube 1 x Per Week/30 Days Discharge Instructions: Apply Zinc Oxide to periwound with each dressing change Topical: Skintegrity Hydrogel 4 (oz) 1 x Per Week/30 Days Discharge Instructions: Apply hydrogel as directed Prim Dressing: ADAPTIC TOUCH 3x4.25 (in/in) 1 x Per Week/30 Days ary Discharge Instructions: Apply to  wound bed as instructed Prim Dressing: EPICORD 1 x Per Week/30 Days ary Discharge Instructions: #6 (02/23/23) Secondary Dressing: Woven Gauze Sponge, Non-Sterile 4x4 in 1 x Per Week/30 Days Discharge Instructions: Apply over primary dressing as directed. Secondary Dressing: Zetuvit Plus 4x8 in 1 x Per Week/30 Days Discharge Instructions: Apply over primary dressing as directed. Secured With: Elastic Bandage 4 inch (ACE bandage) 1 x Per Week/30 Days Discharge Instructions: Secure with ACE bandage as directed. Secured With: [employer], 4.5x3.1 (in/yd) 1 x Per Week/30 Days Discharge Instructions: Secure with Kerlix as directed. Patient Medications [name], [name] ([id number]) [phone].pdf Page 6 of 12 llergies: No Known Drug Allergies [name] Notifications Medication Indication Start End 03/16/2023 lidocaine DOSE topical 4 % cream - cream topical two to four times [name] day Electronic Signature(s) Signed: 03/16/2023 12:43:24 PM By: Duanne Guess MD FACS Entered By: Duanne Guess on 03/16/2023 10:07:40 -------------------------------------------------------------------------------- Problem List Details Patient Name: Date of Service: [name] [name]. 03/16/2023 9:15 [name] Medical Record Number: [id number] Patient Account Number: [id number] Date of Birth/Sex: Treating RN: 1955/09/06 (67 y.o. M) Primary Care Provider: Arva Chafe Other Clinician: Referring Provider: Treating Provider/Extender: [name] in Treatment: 28 Active Problems ICD-10 Encounter Code Description Active Date MDM Diagnosis L97.512 Non-pressure chronic ulcer of other part of right foot with fat layer exposed 08/31/2022 No Yes I73.9 Peripheral vascular disease, unspecified 08/31/2022 No Yes I25.10 Atherosclerotic heart disease of native coronary artery without angina pectoris 08/31/2022 No Yes I10 Essential (primary) hypertension 08/31/2022 No Yes E11.65 Type  2 diabetes mellitus with hyperglycemia 08/31/2022 No Yes E11.40 Type 2 diabetes mellitus with diabetic neuropathy, unspecified 08/31/2022 No Yes I63.9 Cerebral infarction, unspecified 08/31/2022 No Yes Inactive Problems Resolved Problems ICD-10 Code Description Active Date Resolved Date L97.821 Non-pressure chronic ulcer of other part of left lower leg limited to breakdown of skin 11/05/2022 11/05/2022 Electronic Signature(s) [name] ([id number]) [id number].pdf Page 7 of 12 Signed: 03/16/2023 10:04:32 AM By: Duanne Guess MD FACS Entered By: Duanne Guess on 03/16/2023 10:04:31 -------------------------------------------------------------------------------- Progress Note Details Patient Name: Date of Service: Privott, [name]. 03/16/2023 9:15 [name] Medical Record Number: [id number] Patient Account Number: [id number] Date of Birth/Sex: Treating  RN: [birth year] (67 y.o. M) Primary Care Provider: Arva Chafe Other Clinician: Referring Provider: Treating Provider/Extender: [name] in Treatment: 28 Subjective Chief Complaint Information obtained from Patient 05/14/2020; patient is here for review of an abrasion injury on the left lateral calf 08/31/2022: [name] (1st metatarsal base, plantar) History of Present Illness (HPI) ADMISSION 05/14/2020; this is [name] with multiple medical issues. Predominantly he has type 2 diabetes with [name] and also history of fairly significant PAD. He had [name] in February 2017 he also had an atherectomy and angioplasty by Dr. [name] of the right popliteal artery in 2016. He is supposed to be getting arterial studies annually however this was interrupted last year because of the pandemic. He tells [place] he was at [facility] 2 weeks ago was getting out of of the scooter and traumatized his left lateral  lower leg. There was [name] as the patient is on Plavix and Eliquis. They have been dressing this with Neosporin and doing [name] fairly good job. Wound measures 2.5 x 3.5 it does not have any depth he does not have [name] in his legs outside of surgery however he does have chronic edema and skin changes suggestive of chronic venous disease possibly some degree of lymphedema as well. Past medical history includes type 2 diabetes with peripheral neuropathy and gait instability, lumbar spondylosis, obstructive sleep apnea, history of [name], basal cell skin cancer, atrial fibrillation on anticoagulation, significant PAD as noted with [name] and [name] in 30th 2016. He also has [name] of coronary artery disease with an MI in 2002 hypertension hyperlipidemia and heart failure with preserved ejection fraction His last arterial studies I can see in epic were on 03/10/2018 this showed [name] and [name] with monophasic waveforms on the right. On the left his ABI was 1.20 with [name] and triphasic waveforms. He has not had arterial studies since. Our nurse in the clinic got an ABI on the left of 1.1 11/2; left anterior leg wound in the setting of chronic venous insufficiency. Wound was initially trauma. We have been using Hydrofera Blue under compression he has home health.. The wound looks [name] lot better today with improvement in surface area 11/16; left anterior leg wound in the setting of chronic venous insufficiency. Wound was initially trauma. We have been using Hydrofera Blue under compression. The patient is closed today. He is supposed to follow-up with vein and vascular with regards to arterial insufficiency nevertheless his leg wounds are 12/3; apparently 2 weeks ago when they were putting on their stockings they managed to get  3 wounds on the left anterior lower leg from abrasion when putting on the stockings. Home health came by the week of Thanksgiving and put Hydrofera Blue 4-layer wrap on this and there is only one superficial area remaining. The patient and his wife complained about the difficulties getting stockings on I think we are using 20/30. We will order bilateral external compression stockings which should be easier. 12/10; wound on the left anterior lower leg is closed. He has chronic venous insufficiency we ordered him Farrow wrap stockings unfortunately he did not bring these in. READMISSION 08/31/2022 He returns with [name] on the base of  his first metatarsal on the right. He says that it has been present since mid December. He is currently residing in [facility] until March and [name] podiatrist has been looking after him there. They have been simply painting the area with Betadine. He has not had any lower extremity arterial studies since 2019, at which time his right ABI was 0.69. Measured in clinic today, it was 0.71. He is not aware of his most recent hemoglobin A1c, but historically he has had exceptionally poor control. On the basis of his right first metatarsal, there is [name] small crescent shaped wound. There is surrounding eschar and callus. There is no malodor or purulent drainage. 09/08/2022: The original wound is smaller today and fairly clean, but there is some discoloration and [name] pulpy texture to the adjacent callus. Underneath this, the tissue is open exposing the fat layer. It looks like perhaps there was [name] crack in the callus and moisture got under the skin and caused breakdown. 09/15/2022: There has been more moisture related tissue breakdown. The callus is very soft and the underlying tissue is more open. 09/28/2022: The wound looks much better. He has done [name] good job keeping it dry. There is some callus overlying much of the wound surface. There is slough on the exposed open  areas. 10/14/2022: There has been [name] lot of moisture related tissue breakdown. He is still forming callus over the top and then it seems that moisture gets underneath the callus and causes tissue damage. There is slough on the exposed wound surface. [place] 10/21/2022: His foot is less wet, but there is no significant change to his wound. He has developed [name] blister on his left anterior tibial surface. There is no open wound here, but he does have some fairly significant edema. We are planning to apply [name] total contact cast today. 10/23/2022: Here for his obligatory first cast change. He says he has not had any issues wearing the cast or walking in the boot. No detrimental effects on his wound. [name], [name] ([id number]) [id number] Page 8 of 12 10/29/2022: The wound is looking better. Clearly, putting him in the cast has prevented water from getting in under his callus and causing further tissue breakdown. The blister on his left anterior tibial surface has not yet ruptured. Edema control is significantly improved. 11/05/2022: The wound on his right plantar foot is getting better. There is still some callus accumulation around the wounds, but there are just 2 open areas now, [name] very small 1 on the medial aspect of his metatarsal head and [name] little bit larger 1 on the plantar surface. The blister on his left anterior tibial surface is now open with hanging dry skin. 11/12/2022: The left anterior tibial wound is closed. The wound on his right plantar foot is smaller with just [name] little bit of callus around the edges. 11/19/2022: The right plantar foot wound continues to contract. The surface is quite clean. The tiny satellite area on the medial aspect of his foot has closed. 11/26/2022: The wound is smaller again today. He is responding well to the offloading effects of total contact casting. 12/03/2022: The wound is about the same  size today. There is [name] little bit of moisture around the perimeter of the wound. No significant tissue breakdown. 12/10/2022: The wound is smaller today. There has been no moisture-related tissue breakdown of any maceration. 12/21/2022: The wound continues to contract. Moisture control is  excellent. 12/28/2022: The wound is [name] little bit smaller today. 01/05/2023: The wound measures smaller today. Excellent moisture control with zero evidence of maceration. The wound surface is clean with healthy granulation tissue. 01/13/2023: We are leaving his Apligraf in situ for [name] second week. There is no discolored drainage or malodor coming from the site. 01/19/2023: The wound is smaller today. The periwound skin is in good condition without tissue maceration and the healed areas of tissue appear to have improved integrity. 01/27/2023: Continued contraction of the wound. No tissue maceration. The wound surface is flush with the surrounding skin. 02/02/2023: The wound measured [name] little bit smaller today. There is some bruising just adjacent to the wound, but this has not resulted in any tissue breakdown. 02/09/2023: The wound is smaller again today. The area of bruising that was seen last week has resolved and there has been no tissue damage as [name] result. There is some callus accumulation around the wound edges along with minimal soft slough on the surface. 02/16/2023: The wound measured [name] little bit larger today. There is [name] little bit of maceration around the wound edges, which is likely responsible. 02/23/2023: The wound is smaller today and there is epithelium encroaching around the edges. There is no periwound tissue maceration, nor has he accumulated any significant callus. 03/03/2023: His wound continues to contract. There is no maceration nor any significant callus. There is slight slough on the wound surface. 03/09/2023: The wound is [name] little bit smaller again today. He is managing very well in the peg assist offloading  insert and there has been no tissue breakdown or periwound maceration as [name] result of being out of the total contact cast. 03/16/2023: The wound measured smaller again today. He continues to do well using the peg assist offloading insert. Patient History Information obtained from Patient. Family History Unknown History. Social History Never smoker, Marital Status - Single, Alcohol Use - Never, Drug Use - No History, Caffeine Use - Rarely. Medical History Respiratory Patient has history of Sleep Apnea Cardiovascular Patient has history of Arrhythmia - [name]-Fib, Coronary Artery Disease - s/p CABG, Deep Vein Thrombosis, Hypertension, Myocardial Infarction, Peripheral Arterial Disease - s/p Fempop x 2 Endocrine Patient has history of Type II Diabetes Neurologic Patient has history of Neuropathy Hospitalization/Surgery History - colonoscopy. - polypectomy. - peripheral vascular cath. - shoulder arthroscopy. - carpal tunnel release. - coronary artery bypass. - appendectomy. - cardiac cath. - coronary angioplasty. - thrombectomy. - knee arthroplasty. - popliteal artery stent. 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 Bradycardic, asymptomatic. no acute distress. Vitals Time Taken: 9:08 AM, Height: 70 in, Weight: 260 lbs, BMI: 37.3, Temperature: 97.6 F, Pulse: 43 bpm, Respiratory Rate: 18 breaths/min, Blood Pressure: 174/69 mmHg. Respiratory Normal work of breathing on room air. General Notes: 03/16/2023: The wound is [name] little bit smaller today. There is some slough buildup on the surface and some periwound callus accumulation. Integumentary (Hair, Skin) Wound #2 status is Open. Original cause of wound was Gradually Appeared. The date acquired was: 07/08/2022. The  wound has been in treatment 28 weeks. The wound is located on the Right,Plantar Foot. The wound measures 0.6cm length x 0.6cm width x 0.1cm depth; 0.283cm^2 area and 0.028cm^3 volume. There is Fat Layer (Subcutaneous Tissue) exposed. There is no tunneling or undermining noted. There is [name] medium amount of serosanguineous drainage noted. The  wound margin is distinct with the outline attached to the wound base. There is large (67-100%) red, pink granulation within the wound bed. There is [name] small (1-33%) amount of necrotic tissue within the wound bed including Adherent Slough. The periwound skin appearance had no abnormalities noted for color. The periwound skin appearance exhibited: Callus, Dry/Scaly. The periwound skin appearance did not exhibit: Maceration. Periwound temperature was noted as No Abnormality. Assessment Active Problems ICD-10 Non-pressure chronic ulcer of other part of right foot with fat layer exposed Peripheral vascular disease, unspecified Atherosclerotic heart disease of native coronary artery without angina pectoris Essential (primary) hypertension Type 2 diabetes mellitus with hyperglycemia Type 2 diabetes mellitus with diabetic neuropathy, unspecified Cerebral infarction, unspecified Procedures Wound #2 Pre-procedure diagnosis of Wound #2 is [name] Diabetic Wound/Ulcer of the Lower Extremity located on the Right,Plantar Foot .Severity of Tissue Pre Debridement is: Fat layer exposed. There was [name] Selective/Open Wound Skin/Epidermis Debridement with [name] total area of 0.28 sq cm performed by Duanne Guess, MD. With the following instrument(s): Curette to remove Non-Viable tissue/material. Material removed includes Callus, Slough, and Skin: Epidermis after achieving pain control using Lidocaine 4% T opical Solution. No specimens were taken. [name] time out was conducted at 09:35, prior to the start of the procedure. [name] Minimum amount of bleeding was controlled with Pressure. The  procedure was tolerated well. Post Debridement Measurements: 0.6cm length x 0.6cm width x 0.1cm depth; 0.028cm^3 volume. Character of Wound/Ulcer Post Debridement is improved. Severity of Tissue Post Debridement is: Fat layer exposed. Post procedure Diagnosis Wound #2: Same as Pre-Procedure Pre-procedure diagnosis of Wound #2 is [name] Diabetic Wound/Ulcer of the Lower Extremity located on the Right,Plantar Foot. [name] skin graft procedure using [name] bioengineered skin substitute/cellular or tissue based product was performed by Duanne Guess, MD with the following instrument(s): Forceps and Scissors. Epicord was applied and secured with Steri-Strips. 3 sq cm of product was utilized and 3 sq cm was wasted due to wound size. Post Application, adaptic was applied. [name] Time Out was conducted at 09:36, prior to the start of the procedure. The procedure was tolerated well with [name] pain level of 0 throughout and [name] pain level of 0 following the procedure. Post procedure Diagnosis Wound #2: Same as Pre-Procedure . Plan Follow-up Appointments: Return Appointment in 1 week. - Dr. Lady Gary - room 2 Anesthetic: (In clinic) Topical Lidocaine 4% applied to wound bed Cellular or Tissue Based Products: Wound #2 Right,Plantar Foot: Cellular or Tissue Based Product Type: - Epicord #9 Cellular or Tissue Based Product applied to wound bed, secured with steri-strips, cover with Adaptic or Mepitel. (DO NOT REMOVE). Bathing/ Shower/ Hygiene: May shower with protection but do not get wound dressing(s) wet. Protect dressing(s) with water repellant cover (for example, large plastic bag) or [name] cast cover and may then take shower. Edema Control - Lymphedema / SCD / Other: Elevate legs to the level of the heart or above for 30 minutes daily and/or when sitting for 3-4 times [name] day throughout the day. Avoid standing for long periods of time. Patient to wear own compression stockings every day. Moisturize legs  daily. Off-Loading: [name], [name] ([id number]) [id number].pdf Page 10 of 12 Open toe surgical shoe to: - with peg assist insert to right foot The following medication(s) was prescribed: lidocaine topical 4 % cream cream topical two to four times [name] day was prescribed at facility WOUND #2: - Foot Wound Laterality: Plantar, Right Cleanser: Soap and Water 1 x Per Week/30 Days Discharge Instructions: May shower  and wash wound with dial antibacterial soap and water prior to dressing change. Cleanser: Wound Cleanser 1 x Per Week/30 Days Discharge Instructions: Cleanse the wound with wound cleanser prior to applying [name] clean dressing using gauze sponges, not tissue or cotton balls. Peri-Wound Care: Zinc Oxide Ointment 30g tube 1 x Per Week/30 Days Discharge Instructions: Apply Zinc Oxide to periwound with each dressing change Topical: Skintegrity Hydrogel 4 (oz) 1 x Per Week/30 Days Discharge Instructions: Apply hydrogel as directed Prim Dressing: ADAPTIC TOUCH 3x4.25 (in/in) 1 x Per Week/30 Days ary Discharge Instructions: Apply to wound bed as instructed Prim Dressing: EPICORD 1 x Per Week/30 Days ary Discharge Instructions: #6 (02/23/23) Secondary Dressing: Woven Gauze Sponge, Non-Sterile 4x4 in 1 x Per Week/30 Days Discharge Instructions: Apply over primary dressing as directed. Secondary Dressing: Zetuvit Plus 4x8 in 1 x Per Week/30 Days Discharge Instructions: Apply over primary dressing as directed. Secured With: Elastic Bandage 4 inch (ACE bandage) 1 x Per Week/30 Days Discharge Instructions: Secure with ACE bandage as directed. Secured With: [employer], 4.5x3.1 (in/yd) 1 x Per Week/30 Days Discharge Instructions: Secure with Kerlix as directed. 03/16/2023: The wound is [name] little bit smaller today. There is some slough buildup on the surface and some periwound callus accumulation. I used [name] curette to debride callus, slough, and skin from his wound. I then  moistened the Epicord skin substitute with saline and applied it in standard fashion. I put zinc oxide around the wound to protect the skin from moisture. Everything was secured in place with Adaptic and Steri-Strips. He will continue to wear the peg assist offloading shoe. Follow-up in 1 week. Electronic Signature(s) Signed: 03/16/2023 10:20:54 AM By: Duanne Guess MD FACS Previous Signature: 03/16/2023 10:09:07 AM Version By: Duanne Guess MD FACS Entered By: Duanne Guess on 03/16/2023 10:20:54 -------------------------------------------------------------------------------- HxROS Details Patient Name: Date of Service: [name] [name]. 03/16/2023 9:15 [name] Medical Record Number: [id number] Patient Account Number: [id number] Date of Birth/Sex: Treating RN: July 22, 1955 (67 y.o. M) Primary Care Provider: Arva Chafe Other Clinician: Referring Provider: Treating Provider/Extender: [name] in Treatment: 28 Information Obtained From Patient Respiratory Medical History: Positive for: Sleep Apnea Cardiovascular Medical History: Positive for: Arrhythmia - [name]-Fib; Coronary Artery Disease - s/p CABG; Deep Vein Thrombosis; Hypertension; Myocardial Infarction; Peripheral Arterial Disease - s/p Fempop x 2 Past Medical History Notes: CVA x 3 Endocrine Medical History: Positive for: Type II Diabetes Time with diabetes: since 1997 Treated with: Insulin, Oral agents Blood sugar tested every day: Yes Tested : 2-3x per day [name], [name] ([id number]) [phone].pdf Page 11 of 12 Musculoskeletal Medical History: Past Medical History Notes: carpal tunnel syndrome Neurologic Medical History: Positive for: Neuropathy Past Medical History Notes: stroke Oncologic Medical History: Past Medical History Notes: skin cancer Immunizations Pneumococcal Vaccine: Received Pneumococcal Vaccination: Yes Received Pneumococcal Vaccination  On or After 60th Birthday: Yes Implantable Devices None Hospitalization / Surgery History Type of Hospitalization/Surgery colonoscopy polypectomy peripheral vascular cath shoulder arthroscopy carpal tunnel release coronary artery bypass appendectomy cardiac cath coronary angioplasty thrombectomy knee arthroplasty popliteal artery stent Family and Social History Unknown History: Yes; Never smoker; Marital Status - Single; Alcohol Use: Never; Drug Use: No History; Caffeine Use: Rarely; Financial Concerns: No; Food, Clothing or Shelter Needs: No; Support System Lacking: No; Transportation Concerns: No Psychologist, prison and probation services) Signed: 03/16/2023 12:43:24 PM By: Duanne Guess MD FACS Entered By: Duanne Guess on 03/16/2023 10:06:40 -------------------------------------------------------------------------------- SuperBill Details Patient Name: Date of Service: Sturgess, [name]. 03/16/2023 Medical Record  Number: [id number] Patient Account Number: [id number] Date of Birth/Sex: Treating RN: 1956/03/12 (67 y.o. M) Primary Care Provider: Arva Chafe Other Clinician: Referring Provider: Treating Provider/Extender: [name] in Treatment: 28 Diagnosis Coding ICD-10 Codes Code Description [phone] Non-pressure chronic ulcer of other part of right foot with fat layer exposed I73.9 Peripheral vascular disease, unspecified Sieh, Moishy [name] ([id number]) [phone].pdf Page 12 of 12 I25.10 Atherosclerotic heart disease of native coronary artery without angina pectoris I10 Essential (primary) hypertension E11.65 Type 2 diabetes mellitus with hyperglycemia E11.40 Type 2 diabetes mellitus with diabetic neuropathy, unspecified I63.9 Cerebral infarction, unspecified Facility Procedures : CPT4 Code: [id number] Description: Q4187 Epicord 2cm x 3cm - per sqcm Modifier: Quantity: 6 : CPT4 Code: [id number] Description: 15275 - SKIN SUB  GRAFT FACE/NK/HF/G ICD-10 Diagnosis Description L97.512 Non-pressure chronic ulcer of other part of right foot with fat layer exposed Modifier: Quantity: 1 Physician Procedures : CPT4 Code Description Modifier [id number] - WC PHYS LEVEL 4 - EST PT 25 ICD-10 Diagnosis Description L97.512 Non-pressure chronic ulcer of other part of right foot with fat layer exposed I73.9 Peripheral vascular disease, unspecified E11.65 Type 2  diabetes mellitus with hyperglycemia E11.40 Type 2 diabetes mellitus with diabetic neuropathy, unspecified Quantity: 1 : [id number] 15275 - WC PHYS SKIN SUB GRAFT FACE/NK/HF/G ICD-10 Diagnosis Description L97.512 Non-pressure chronic ulcer of other part of right foot with fat layer exposed Quantity: 1 Electronic Signature(s) Signed: 03/16/2023 10:21:14 AM By: Duanne Guess MD FACS Entered By: Duanne Guess on 03/16/2023 10:21:14

## 2023-03-16 NOTE — Progress Notes (Signed)
Jacome, [name] [name] ([id number]) [id number] Page 1 of 7 Visit Report for 03/03/2023 Arrival Information Details Patient Name: Date of Service: [name], [name]. 03/03/2023 9:15 [name] Medical Record Number: [id number] Patient Account Number: [id number] Date of Birth/Sex: Treating RN: [birth year] (67 y.o. [name] Primary Care Favour Aleshire: Arva Chafe Other Clinician: Referring Duquan Gillooly: Treating Chavela Justiniano/Extender: [name] in Treatment: 26 Visit Information History Since Last Visit Added or deleted any medications: No Patient Arrived: Ambulatory Any new allergies or adverse reactions: No Arrival Time: 09:04 Had [name] fall or experienced change in No Accompanied By: partner activities of daily living that may affect Transfer Assistance: None risk of falls: Patient Identification Verified: Yes Signs or symptoms of abuse/neglect since last visito No Secondary Verification Process Completed: Yes Hospitalized since last visit: No Patient Requires Transmission-Based Precautions: No Implantable device outside of the clinic excluding No Patient Has Alerts: No cellular tissue based products placed in the center since last visit: Has Dressing in Place as Prescribed: Yes Has Footwear/Offloading in Place as Prescribed: Yes Right: T Contact Cast otal Pain Present Now: No Electronic Signature(s) Signed: 03/03/2023 4:38:35 PM By: Samuella Bruin Entered By: Samuella Bruin on 03/03/2023 09:05:07 -------------------------------------------------------------------------------- Encounter Discharge Information Details Patient Name: Date of Service: [name], [name]. 03/03/2023 9:15 [name] Medical Record Number: [id number] Patient Account Number: [id number] Date of Birth/Sex: Treating RN: May 26, 1956 (67 y.o. [name] Primary Care Willford Rabideau: Arva Chafe Other Clinician: Referring Blayne Frankie: Treating Tavius Turgeon/Extender:  [name] in Treatment: 26 Encounter Discharge Information Items Post Procedure Vitals Discharge Condition: Stable Temperature (F): 97.9 Ambulatory Status: Ambulatory Pulse (bpm): 63 Discharge Destination: Home Respiratory Rate (breaths/min): 18 Transportation: Private Auto Blood Pressure (mmHg): 170/73 Accompanied By: partner Schedule Follow-up Appointment: Yes Clinical Summary of Care: Patient Declined Electronic Signature(s) Signed: 03/03/2023 4:38:35 PM By: Samuella Bruin Entered By: Samuella Bruin on 03/03/2023 09:55:00 Harcum, [name] ([id number]) [id number] Page 2 of 7 -------------------------------------------------------------------------------- Lower Extremity Assessment Details Patient Name: Date of Service: [name], [name]. 03/03/2023 9:15 [name] Medical Record Number: [id number] Patient Account Number: [id number] Date of Birth/Sex: Treating RN: 11-20-1955 (67 y.o. [name] Primary Care Novelle Addair: Arva Chafe Other Clinician: Referring Woody Kronberg: Treating Bryam Taborda/Extender: [name] in Treatment: 26 Edema Assessment Assessed: [name]: No] [name]: No] Edema: [Left: Ye] [Right: s] Calf Left: Right: Point of Measurement: From Medial Instep 38.3 cm Ankle Left: Right: Point of Measurement: From Medial Instep 23.9 cm Vascular Assessment Pulses: Dorsalis Pedis Palpable: [Right:Yes] Extremity colors, hair growth, and conditions: Extremity Color: [Right:Normal] Hair Growth on Extremity: [Right:Yes] Temperature of Extremity: [Right:Warm] Capillary Refill: [Right:< 3 seconds] Dependent Rubor: [Right:No No] Electronic Signature(s) Signed: 03/03/2023 4:38:35 PM By: Samuella Bruin Entered By: Samuella Bruin on 03/03/2023 09:16:22 -------------------------------------------------------------------------------- Multi Wound Chart Details Patient Name: Date  of Service: [name], [name]. 03/03/2023 9:15 [name] Medical Record Number: [id number] Patient Account Number: [id number] Date of Birth/Sex: Treating RN: [birth year] (67 y.o. 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 [name] Primary Etiology: Extremity Sleep Apnea, Arrhythmia, Coronary [name] [name] Comorbid History: Artery Disease, Deep Vein Thrombosis, Hypertension, Myocardial Infarction, Peripheral Arterial Disease, Type II Diabetes, Neuropathy 07/08/2022 [name] [name]  Date Acquired: 67 [name] [name] Weeks of Treatment: Open [name] [name] Wound Status: No [name] [name] Wound Recurrence: 0.7x0.7x0.1 [name] [name] Measurements L x W x D (cm) 0.385 [name] [name] [name] : rea 0.038 [name] [name] Volume (cm) : 84.90% [name] [name] % Reduction in [name] rea: 85.10% [name] [name] % Reduction in Volume: Grade 1 [name] [name] Classification: Medium [name] [name] Exudate [name] mount: Serosanguineous [name] [name] Exudate Type: red, brown [name] [name] Exudate Color: Distinct, outline attached [name] [name] Wound Margin: Large (67-100%) [name] [name] Granulation [name] mount: Red, Pink, Pale [name] [name] Granulation Quality: Small (1-33%) [name] [name] Necrotic [name] mount: Fat Layer (Subcutaneous Tissue): Yes [name] [name] Exposed Structures: Fascia: No Tendon: No Muscle: No Joint: No Bone: No Medium (34-66%) [name] [name] Epithelialization: Debridement - Selective/Open Wound [name] [name] Debridement: Pre-procedure Verification/Time Out 09:25 [name] [name] Taken: Lidocaine 4% T opical Solution [name] [name] Pain Control: Callus, Slough [name] [name] Tissue  Debrided: Non-Viable Tissue [name] [name] Level: 0.38 [name] [name] Debridement [name] (sq cm): rea Curette [name] [name] Instrument: Minimum [name] [name] Bleeding: Pressure [name] [name] Hemostasis [name] chieved: Procedure was tolerated well [name] [name] Debridement Treatment Response: 0.7x0.7x0.1 [name] [name] Post Debridement Measurements L x W x D (cm) 0.038 [name] [name] Post Debridement Volume: (cm) Callus: Yes [name] [name] Periwound Skin Texture: Dry/Scaly: Yes [name] [name] Periwound Skin Moisture: Maceration: No No Abnormalities Noted [name] [name] Periwound Skin Color: No Abnormality [name] [name] Temperature: Cellular or Tissue Based Product [name] [name] Procedures Performed: Debridement Treatment Notes Electronic Signature(s) Signed: 03/03/2023 9:37:51 AM By: Duanne Guess MD FACS Entered By: Duanne Guess on 03/03/2023 09:37:51 -------------------------------------------------------------------------------- Multi-Disciplinary Care Plan Details Patient Name: Date of Service: [name], [name]. 03/03/2023 9:15 [name] Medical Record Number: [id number] Patient Account Number: [id number] Date of Birth/Sex: Treating RN: 05/04/1956 (67 y.o. [name] Primary Care Lynel Forester: Arva Chafe Other Clinician: Referring Kimerly Rowand: Treating Sarahbeth Cashin/Extender: Ocie Doyne Catarina, [place] [name] ([id number]) [phone].pdf Page 4 of 7 Weeks in Treatment: 26 Multidisciplinary Care Plan reviewed with physician Active Inactive Abuse / Safety / Falls / Self Care Management Nursing Diagnoses: Impaired physical mobility Potential for falls Goals: Patient will not experience any injury related to falls Date Initiated: 08/31/2022 Target Resolution Date: 04/21/2023 Goal Status: Active Patient/caregiver will verbalize/demonstrate measures taken to improve the patient's personal safety Date Initiated: 08/31/2022 Target Resolution Date: 04/21/2023 Goal Status: Active Interventions: Provide education on basic  hygiene Provide education on fall prevention Notes: Electronic Signature(s) Signed: 03/03/2023 4:38:35 PM By: Samuella Bruin Entered By: Samuella Bruin on 03/03/2023 09:17:21 -------------------------------------------------------------------------------- Pain Assessment Details Patient Name: Date of Service: [name], [name]. 03/03/2023 9:15 [name] Medical Record Number: [id number] Patient Account Number: [id number] Date of Birth/Sex: Treating RN: Jan 07, 1956 (67 y.o. [name] Primary Care Yona Stansbury: Arva Chafe Other Clinician: Referring Alexiya Franqui: Treating Aideen Fenster/Extender: [name] in Treatment: 26 Active Problems Location of Pain Severity and Description of Pain Patient Has Paino No Site Locations Rate the pain. Current Pain Level: 0 Pain Management and Medication Current Pain Management: Vanengen, Keller [name] ([id number]) [phone].pdf Page 5 of 7 Electronic Signature(s) Signed: 03/03/2023 4:38:35 PM By: Gelene Mink By: Samuella Bruin on 03/03/2023 09:05:15 -------------------------------------------------------------------------------- Patient/Caregiver Education Details Patient Name: Date of Service: [name] [name]. 8/14/2024andnbsp9:15 [name] Medical Record Number: [id number] Patient Account Number: [id number] Date of Birth/Gender: Treating RN: 09-Nov-1955 (67 y.o. [name] Primary Care Physician: Arva Chafe Other Clinician: Referring Physician: Treating Physician/Extender: [name] in Treatment: 26 Education Assessment  Education Provided To: Patient Education Topics Provided Wound/Skin Impairment: Methods: Explain/Verbal Responses: Reinforcements needed, State content correctly Electronic Signature(s) Signed: 03/03/2023 4:38:35 PM By: Samuella Bruin Entered By: Samuella Bruin on 03/03/2023  09:17:33 -------------------------------------------------------------------------------- Wound Assessment Details Patient Name: Date of Service: [name], [name]. 03/03/2023 9:15 [name] Medical Record Number: [id number] Patient Account Number: [id number] Date of Birth/Sex: Treating RN: July 07, 1956 (67 y.o. [name] Primary Care Shykeria Sakamoto: Arva Chafe Other Clinician: Referring Keyasha Miah: Treating Edin Skarda/Extender: [name] in Treatment: 26 Wound Status Wound Number: 2 Primary Diabetic Wound/Ulcer of the Lower Extremity Etiology: Wound Location: Right, Plantar Foot Wound Open Wounding Event: Gradually Appeared Status: Date Acquired: 07/08/2022 Comorbid Sleep Apnea, Arrhythmia, Coronary Artery Disease, Deep Vein Weeks Of Treatment: 26 History: Thrombosis, Hypertension, Myocardial Infarction, Peripheral Arterial Clustered Wound: No Disease, Type II Diabetes, Neuropathy Photos [name], [name] ([id number]) [phone].pdf Page 6 of 7 Wound Measurements Length: (cm) 0.7 Width: (cm) 0.7 Depth: (cm) 0.1 Area: (cm) 0.385 Volume: (cm) 0.038 % Reduction in Area: 84.9% % Reduction in Volume: 85.1% Epithelialization: Medium (34-66%) Tunneling: No Undermining: No Wound Description Classification: Grade 1 Wound Margin: Distinct, outline attached Exudate Amount: Medium Exudate Type: Serosanguineous Exudate Color: red, brown Foul Odor After Cleansing: No Slough/Fibrino Yes Wound Bed Granulation Amount: Large (67-100%) Exposed Structure Granulation Quality: Red, Pink, Pale Fascia Exposed: No Necrotic Amount: Small (1-33%) Fat Layer (Subcutaneous Tissue) Exposed: Yes Necrotic Quality: Adherent Slough Tendon Exposed: No Muscle Exposed: No Joint Exposed: No Bone Exposed: No Periwound Skin Texture Texture Color No Abnormalities Noted: No No Abnormalities Noted: Yes Callus: Yes Temperature / Pain Temperature: No  Abnormality Moisture No Abnormalities Noted: No Dry / Scaly: Yes Maceration: No Electronic Signature(s) Signed: 03/03/2023 4:38:35 PM By: Samuella Bruin Signed: 03/16/2023 2:03:55 PM By: Brenton Grills Entered By: Brenton Grills on 03/03/2023 09:19:22 -------------------------------------------------------------------------------- Vitals Details Patient Name: Date of Service: [name], [name]. 03/03/2023 9:15 [name] Medical Record Number: [id number] Patient Account Number: [id number] Date of Birth/Sex: Treating RN: [birth year] (67 y.o. [name] Primary Care Sufian Ravi: Arva Chafe Other Clinician: Referring Fawna Cranmer: Treating Athelene Hursey/Extender: [name] in Treatment: 26 Vital Signs Time Taken: 09:08 Temperature (F): 97.9 Height (in): 70 Pulse (bpm): 63 Weight (lbs): 260 Respiratory Rate (breaths/min): 18 Body Mass Index (BMI): 37.3 Blood Pressure (mmHg): 170/73 Reference Range: 80 - 120 mg / dl [name], [name] ([id number]) [id number] Page 7 of 7 Electronic Signature(s) Signed: 03/03/2023 4:38:35 PM By: Samuella Bruin Entered By: Samuella Bruin on 03/03/2023 09:08:15

## 2023-03-19 ENCOUNTER — Other Ambulatory Visit: Payer: Self-pay | Admitting: Nurse Practitioner

## 2023-03-20 DIAGNOSIS — G4733 Obstructive sleep apnea (adult) (pediatric): Secondary | ICD-10-CM | POA: Diagnosis not present

## 2023-03-23 ENCOUNTER — Encounter (HOSPITAL_BASED_OUTPATIENT_CLINIC_OR_DEPARTMENT_OTHER): Payer: Medicare HMO | Attending: General Surgery | Admitting: General Surgery

## 2023-03-23 DIAGNOSIS — E1142 Type 2 diabetes mellitus with diabetic polyneuropathy: Secondary | ICD-10-CM | POA: Insufficient documentation

## 2023-03-23 DIAGNOSIS — Z8673 Personal history of transient ischemic attack (TIA), and cerebral infarction without residual deficits: Secondary | ICD-10-CM | POA: Insufficient documentation

## 2023-03-23 DIAGNOSIS — G4733 Obstructive sleep apnea (adult) (pediatric): Secondary | ICD-10-CM | POA: Diagnosis not present

## 2023-03-23 DIAGNOSIS — Z85828 Personal history of other malignant neoplasm of skin: Secondary | ICD-10-CM | POA: Diagnosis not present

## 2023-03-23 DIAGNOSIS — E11622 Type 2 diabetes mellitus with other skin ulcer: Secondary | ICD-10-CM | POA: Insufficient documentation

## 2023-03-23 DIAGNOSIS — E1151 Type 2 diabetes mellitus with diabetic peripheral angiopathy without gangrene: Secondary | ICD-10-CM | POA: Diagnosis not present

## 2023-03-23 DIAGNOSIS — L97512 Non-pressure chronic ulcer of other part of right foot with fat layer exposed: Secondary | ICD-10-CM | POA: Insufficient documentation

## 2023-03-23 DIAGNOSIS — Z7901 Long term (current) use of anticoagulants: Secondary | ICD-10-CM | POA: Diagnosis not present

## 2023-03-23 DIAGNOSIS — I110 Hypertensive heart disease with heart failure: Secondary | ICD-10-CM | POA: Insufficient documentation

## 2023-03-23 DIAGNOSIS — L84 Corns and callosities: Secondary | ICD-10-CM | POA: Diagnosis not present

## 2023-03-23 DIAGNOSIS — Z7902 Long term (current) use of antithrombotics/antiplatelets: Secondary | ICD-10-CM | POA: Insufficient documentation

## 2023-03-23 DIAGNOSIS — I5032 Chronic diastolic (congestive) heart failure: Secondary | ICD-10-CM | POA: Diagnosis not present

## 2023-03-23 DIAGNOSIS — E11621 Type 2 diabetes mellitus with foot ulcer: Secondary | ICD-10-CM | POA: Insufficient documentation

## 2023-03-23 DIAGNOSIS — E1165 Type 2 diabetes mellitus with hyperglycemia: Secondary | ICD-10-CM | POA: Insufficient documentation

## 2023-03-23 DIAGNOSIS — I2510 Atherosclerotic heart disease of native coronary artery without angina pectoris: Secondary | ICD-10-CM | POA: Insufficient documentation

## 2023-03-23 NOTE — Progress Notes (Signed)
Brusca, Pope A ([id number]) [id number] Page 1 of 7 Visit Report for 03/23/2023 Arrival Information Details Patient Name: Date of Service: [place], [name] 03/23/2023 9:45 A M Medical Record Number: [id number] Patient Account Number: [id number] Date of Birth/Sex: Treating RN: [birth year] (67 y.o. M) Primary Care Daichi Moris: Arva Chafe Other Clinician: Referring Nivan Melendrez: Treating Azariah Latendresse/Extender: [name] in Treatment: 29 Visit Information History Since Last Visit All ordered tests and consults were completed: No Patient Arrived: Ambulatory Added or deleted any medications: No Arrival Time: 09:36 Any new allergies or adverse reactions: No Accompanied By: spouse Had a fall or experienced change in No Transfer Assistance: None activities of daily living that may affect Patient Identification Verified: Yes risk of falls: Secondary Verification Process Completed: Yes Signs or symptoms of abuse/neglect since No Patient Requires Transmission-Based Precautions: No last visito Patient Has Alerts: No Hospitalized since last visit: No Implantable device outside of the clinic No excluding cellular tissue based products placed in the center since last visit: Has Dressing in Place as Prescribed: Yes Has Footwear/Offloading in Place as Yes Prescribed: Right: Surgical Shoe with Pressure Relief Insole Pain Present Now: No Electronic Signature(s) Signed: 03/23/2023 5:02:17 PM By: Zenaida Deed RN, BSN Entered By: Zenaida Deed on 03/23/2023 07:16:00 -------------------------------------------------------------------------------- Lower Extremity Assessment Details Patient Name: Date of Service: [place], [name] 03/23/2023 9:45 A M Medical Record Number: [id number] Patient Account Number: [id number] Date of Birth/Sex: Treating RN: 1956-01-27 (67 y.o. [name] Primary Care Giovanni Biby: Arva Chafe Other  Clinician: Referring Kaelen Caughlin: Treating Anna Livers/Extender: [name] in Treatment: 29 Edema Assessment Assessed: [Left: No] [Right: No] Edema: [Left: Ye] [Right: s] Calf Left: Right: Point of Measurement: From Medial Instep 38.3 cm Ankle Left: Right: Point of Measurement: From Medial Instep 23.9 cm Cabello, Ruairi A ([id number]) [id number] Page 2 of 7 Vascular Assessment Pulses: Dorsalis Pedis Palpable: [Right:Yes] Extremity colors, hair growth, and conditions: Extremity Color: [Right:Normal] Hair Growth on Extremity: [Right:Yes] Temperature of Extremity: [Right:Warm] Capillary Refill: [Right:< 3 seconds] Dependent Rubor: [Right:No No] Electronic Signature(s) Signed: 03/23/2023 5:02:17 PM By: Zenaida Deed RN, BSN Entered By: Zenaida Deed on 03/23/2023 07:18:14 -------------------------------------------------------------------------------- Multi Wound Chart Details Patient Name: Date of Service: [name], [name] 03/23/2023 9:45 A M Medical Record Number: [id number] Patient Account Number: [id number] Date of Birth/Sex: Treating RN: 09-Apr-1956 (67 y.o. M) Primary Care Colston Pyle: Arva Chafe Other Clinician: Referring Khalib Fendley: Treating Fredi Hurtado/Extender: [name] in Treatment: 29 Vital Signs Height(in): 70 Capillary Blood Glucose(mg/dl): 433 Weight(lbs): 295 Pulse(bpm): 50 Body Mass Index(BMI): 37.3 Blood Pressure(mmHg): 181/72 Temperature(F): 97.8 Respiratory Rate(breaths/min): 18 [2:Photos:] [N/A:N/A] Right, Plantar Foot N/A N/A Wound Location: Gradually Appeared N/A N/A Wounding Event: Diabetic Wound/Ulcer of the Lower N/A N/A Primary Etiology: Extremity Sleep Apnea, Arrhythmia, Coronary N/A N/A Comorbid History: Artery Disease, Deep Vein Thrombosis, Hypertension, Myocardial Infarction, Peripheral Arterial Disease, Type II Diabetes, Neuropathy 07/08/2022 N/A  N/A Date Acquired: 18 N/A N/A Weeks of Treatment: Open N/A N/A Wound Status: No N/A N/A Wound Recurrence: 0.6x0.6x0.1 N/A N/A Measurements L x W x D (cm) 0.283 N/A N/A A (cm) : rea 0.028 N/A N/A Volume (cm) : 88.90% N/A N/A % Reduction in A rea: 89.00% N/A N/A % Reduction in Volume: Grade 1 N/A N/A Classification: Medium N/A N/A Exudate A mount: Serosanguineous N/A N/A Exudate Type: red, brown N/A N/A Exudate Color: Distinct, outline attached N/A N/A Wound Margin: Large (67-100%) N/A N/A Granulation A mount: Goldberg, Jireh A ([id number]) [id number] Page 3 of 7 Red  N/A N/A Granulation Quality: Small (1-33%) N/A N/A Necrotic Amount: Fat Layer (Subcutaneous Tissue): Yes N/A N/A Exposed Structures: Fascia: No Tendon: No Muscle: No Joint: No Bone: No Small (1-33%) N/A N/A Epithelialization: Debridement - Selective/Open Wound N/A N/A Debridement: Pre-procedure Verification/Time Out 10:30 N/A N/A Taken: Lidocaine 4% T opical Solution N/A N/A Pain Control: Callus, Slough N/A N/A Tissue Debrided: Skin/Epidermis N/A N/A Level: 0.28 N/A N/A Debridement A (sq cm): rea Curette N/A N/A Instrument: Minimum N/A N/A Bleeding: Pressure N/A N/A Hemostasis A chieved: 0 N/A N/A Procedural Pain: 0 N/A N/A Post Procedural Pain: Procedure was tolerated well N/A N/A Debridement Treatment Response: 0.6x0.6x0.1 N/A N/A Post Debridement Measurements L x W x D (cm) 0.028 N/A N/A Post Debridement Volume: (cm) Callus: Yes N/A N/A Periwound Skin Texture: Maceration: No N/A N/A Periwound Skin Moisture: Dry/Scaly: No No Abnormalities Noted N/A N/A Periwound Skin Color: No Abnormality N/A N/A Temperature: Cellular or Tissue Based Product N/A N/A Procedures Performed: Debridement Treatment Notes Electronic Signature(s) Signed: 03/23/2023 10:44:22 AM By: Duanne Guess MD FACS Entered By: Duanne Guess on 03/23/2023  07:44:22 -------------------------------------------------------------------------------- Multi-Disciplinary Care Plan Details Patient Name: Date of Service: [name], [name] 03/23/2023 9:45 A M Medical Record Number: [id number] Patient Account Number: [id number] Date of Birth/Sex: Treating RN: Apr 05, 1956 (67 y.o. [name] Primary Care Norie Latendresse: Arva Chafe Other Clinician: Referring Keyaira Clapham: Treating Verneta Hamidi/Extender: [name] in Treatment: 29 Multidisciplinary Care Plan reviewed with physician Active Inactive Abuse / Safety / Falls / Self Care Management Nursing Diagnoses: Impaired physical mobility Potential for falls Goals: Patient will not experience any injury related to falls Date Initiated: 08/31/2022 Target Resolution Date: 04/21/2023 Goal Status: Active Patient/caregiver will verbalize/demonstrate measures taken to improve the patient's personal safety Date Initiated: 08/31/2022 Target Resolution Date: 04/21/2023 Goal Status: Active Interventions: Provide education on basic hygiene Provide education on fall prevention Besson, Fernando A ([id number]) [id number] Page 4 of 7 Notes: Wound/Skin Impairment Nursing Diagnoses: Impaired tissue integrity Knowledge deficit related to ulceration/compromised skin integrity Goals: Patient/caregiver will verbalize understanding of skin care regimen Date Initiated: 08/31/2022 Target Resolution Date: 04/20/2023 Goal Status: Active Interventions: Assess patient/caregiver ability to obtain necessary supplies Assess patient/caregiver ability to perform ulcer/skin care regimen upon admission and as needed Assess ulceration(s) every visit Treatment Activities: Skin care regimen initiated : 08/31/2022 Topical wound management initiated : 08/31/2022 Notes: Electronic Signature(s) Signed: 03/23/2023 5:02:17 PM By: Zenaida Deed RN, BSN Entered By: Zenaida Deed on  03/23/2023 07:22:26 -------------------------------------------------------------------------------- Pain Assessment Details Patient Name: Date of Service: [name], [name] 03/23/2023 9:45 A M Medical Record Number: [id number] Patient Account Number: [id number] Date of Birth/Sex: Treating RN: Aug 14, 1955 (68 y.o. M) Primary Care Whitnee Orzel: Arva Chafe Other Clinician: Referring Aleja Yearwood: Treating Shonna Deiter/Extender: [name] in Treatment: 29 Active Problems Location of Pain Severity and Description of Pain Patient Has Paino No Site Locations Rate the pain. Current Pain Level: 0 Pain Management and Medication Current Pain Management: Electronic Signature(s) Signed: 03/23/2023 5:02:17 PM By: Zenaida Deed RN, BSN Houseman, Rahmir A ([id number]) [phone].pdf Page 5 of 7 Signed: 03/23/2023 5:02:17 PM By: Zenaida Deed RN, BSN Entered By: Zenaida Deed on 03/23/2023 07:17:00 -------------------------------------------------------------------------------- Patient/Caregiver Education Details Patient Name: Date of Service: [name] RK A. 9/3/2024andnbsp9:45 A M Medical Record Number: [id number] Patient Account Number: [id number] Date of Birth/Gender: Treating RN: [birth year] (67 y.o. [name] Primary Care Physician: Arva Chafe Other Clinician: Referring Physician: Treating Physician/Extender: [name] in Treatment: 29 Education Assessment Education Provided  To: Patient Education Topics Provided Elevated Blood Sugar/ Impact on Healing: Methods: Explain/Verbal Responses: Reinforcements needed, State content correctly Offloading: Methods: Explain/Verbal Responses: Reinforcements needed, State content correctly Wound/Skin Impairment: Methods: Explain/Verbal Responses: Reinforcements needed, State content correctly Electronic Signature(s) Signed: 03/23/2023 5:02:17 PM By: Zenaida Deed RN, BSN Entered By: Zenaida Deed on 03/23/2023 07:25:30 -------------------------------------------------------------------------------- Wound Assessment Details Patient Name: Date of Service: [name], [name] 03/23/2023 9:45 A M Medical Record Number: [id number] Patient Account Number: [id number] Date of Birth/Sex: Treating RN: [birth year] (67 y.o. [name] Primary Care Damien Cisar: Arva Chafe Other Clinician: Referring Shalik Sanfilippo: Treating Jerra Huckeby/Extender: [name] in Treatment: 29 Wound Status Wound Number: 2 Primary Diabetic Wound/Ulcer of the Lower Extremity Etiology: Wound Location: Right, Plantar Foot Wound Open Wounding Event: Gradually Appeared Status: Date Acquired: 07/08/2022 Comorbid Sleep Apnea, Arrhythmia, Coronary Artery Disease, Deep Vein Weeks Of Treatment: 29 History: Thrombosis, Hypertension, Myocardial Infarction, Peripheral Arterial Clustered Wound: No Disease, Type II Diabetes, Neuropathy Photos Galan, Zyheir A ([id number]) [id number] Page 6 of 7 Wound Measurements Length: (cm) 0.6 Width: (cm) 0.6 Depth: (cm) 0.1 Area: (cm) 0.283 Volume: (cm) 0.028 % Reduction in Area: 88.9% % Reduction in Volume: 89% Epithelialization: Small (1-33%) Tunneling: No Undermining: No Wound Description Classification: Grade 1 Wound Margin: Distinct, outline attached Exudate Amount: Medium Exudate Type: Serosanguineous Exudate Color: red, brown Foul Odor After Cleansing: No Slough/Fibrino Yes Wound Bed Granulation Amount: Large (67-100%) Exposed Structure Granulation Quality: Red Fascia Exposed: No Necrotic Amount: Small (1-33%) Fat Layer (Subcutaneous Tissue) Exposed: Yes Necrotic Quality: Adherent Slough Tendon Exposed: No Muscle Exposed: No Joint Exposed: No Bone Exposed: No Periwound Skin Texture Texture Color No Abnormalities Noted: No No Abnormalities Noted: Yes Callus: Yes  Temperature / Pain Temperature: No Abnormality Moisture No Abnormalities Noted: No Dry / Scaly: No Maceration: No Electronic Signature(s) Signed: 03/23/2023 5:02:17 PM By: Zenaida Deed RN, BSN Entered By: Zenaida Deed on 03/23/2023 07:19:45 -------------------------------------------------------------------------------- Vitals Details Patient Name: Date of Service: [name], [name] 03/23/2023 9:45 A M Medical Record Number: [id number] Patient Account Number: [id number] Date of Birth/Sex: Treating RN: 1956-02-08 (67 y.o. M) Primary Care Jaylene Arrowood: Arva Chafe Other Clinician: Referring Merissa Renwick: Treating Coden Franchi/Extender: [name] in Treatment: 29 Vital Signs Time Taken: 09:36 Temperature (F): 97.8 Height (in): 70 Pulse (bpm): 50 Weight (lbs): 260 Respiratory Rate (breaths/min): 18 Body Mass Index (BMI): 37.3 Blood Pressure (mmHg): 181/72 Capillary Blood Glucose (mg/dl): 884 Anstead, Davis A ([id number]) [id number] Page 7 of 7 Reference Range: 80 - 120 mg / dl Notes glucose per pt report yesterday Electronic Signature(s) Signed: 03/23/2023 5:02:17 PM By: Zenaida Deed RN, BSN Entered By: Zenaida Deed on 03/23/2023 07:16:41

## 2023-03-23 NOTE — Progress Notes (Signed)
Lemmons, [name] ([id number]) [id number] Page 1 of 12 Visit Report for 03/23/2023 Chief Complaint Document Details Patient Name: Date of Service: [name], [name]. 03/23/2023 9:45 [name] Medical Record Number: [id number] Patient Account Number: [id number] Date of Birth/Sex: Treating RN: 04/24/1956 (67 y.o. M) Primary Care Provider: Arva Chafe Other Clinician: Referring Provider: Treating Provider/Extender: [name] in Treatment: 29 Information Obtained from: Patient Chief Complaint 05/14/2020; patient is here for review of an abrasion injury on the left lateral calf 08/31/2022: [name] (1st metatarsal base, plantar) Electronic Signature(s) Signed: 03/23/2023 10:45:30 AM By: Duanne Guess MD FACS Entered By: Duanne Guess on 03/23/2023 07:45:29 -------------------------------------------------------------------------------- Cellular or Tissue Based Product Details Patient Name: Date of Service: [name]. 03/23/2023 9:45 [name] Medical Record Number: [id number] Patient Account Number: [id number] Date of Birth/Sex: Treating RN: 1956-06-11 (67 y.o. [name] Primary Care Provider: Arva Chafe Other Clinician: Referring Provider: Treating Provider/Extender: [name] in Treatment: 29 Cellular or Tissue Based Product Type Wound #2 Right,Plantar Foot Applied to: Performed By: Physician Duanne Guess, MD Cellular or Tissue Based Product Type: Epicord Level of Consciousness (Pre-procedure): Awake and Alert Pre-procedure Verification/Time Out Yes - 10:35 Taken: Location: genitalia / hands / feet / multiple digits Wound Size (sq cm): 0.36 Product Size (sq cm): 6 Waste Size (sq cm): 4 Waste Reason: wound size Amount of Product Applied (sq cm): 2 Instrument Used: Forceps, Scissors Lot #: [id number] Order #: 10 Expiration Date: 09/20/2027 Fenestrated:  No Reconstituted: Yes Solution Type: saline Solution Amount: 5 ml Lot #: [id number] KS Solution Expiration Date: 08/10/2024 Secured: Yes Secured With: Steri-Strips Dressing Applied: Yes Primary Dressing: adaptic, gauze Procedural Pain: 0 [name], [name] ([id number]) [id number].pdf Page 2 of 12 Post Procedural Pain: 0 Response to Treatment: Procedure was tolerated well Level of Consciousness (Post- Awake and Alert procedure): Post Procedure Diagnosis Same as Pre-procedure Notes scribed for Dr. Lady Gary by Zenaida Deed, RN Electronic Signature(s) Signed: 03/23/2023 10:54:18 AM By: Duanne Guess MD FACS Signed: 03/23/2023 5:02:17 PM By: Zenaida Deed RN, BSN Entered By: Zenaida Deed on 03/23/2023 07:40:56 -------------------------------------------------------------------------------- Debridement Details Patient Name: Date of Service: [name]. 03/23/2023 9:45 [name] Medical Record Number: [id number] Patient Account Number: [id number] Date of Birth/Sex: Treating RN: September 14, 1955 (67 y.o. [name], [name] Primary Care Provider: Arva Chafe Other Clinician: Referring Provider: Treating Provider/Extender: [name] in Treatment: 29 Debridement Performed for Assessment: Wound #2 Right,Plantar Foot Performed By: Physician Duanne Guess, MD Debridement Type: Debridement Severity of Tissue Pre Debridement: Fat layer exposed Level of Consciousness (Pre-procedure): Awake and Alert Pre-procedure Verification/Time Out Yes - 10:30 Taken: Start Time: 10:30 Pain Control: Lidocaine 4% Topical Solution Percent of Wound Bed Debrided: 100% T Area Debrided (cm): otal 0.28 Tissue and other material debrided: Non-Viable, Callus, Slough, Skin: Epidermis, Slough Level: Skin/Epidermis Debridement Description: Selective/Open Wound Instrument: Curette Bleeding: Minimum Hemostasis Achieved: Pressure Procedural Pain: 0 Post  Procedural Pain: 0 Response to Treatment: Procedure was tolerated well Level of Consciousness (Post- Awake and Alert procedure): Post Debridement Measurements of Total Wound Length: (cm) 0.6 Width: (cm) 0.6 Depth: (cm) 0.1 Volume: (cm) 0.028 Character of Wound/Ulcer Post Debridement: Stable Severity of Tissue Post Debridement: Fat layer exposed Post Procedure Diagnosis Same as Pre-procedure Notes scribed for Dr. Lady Gary by Zenaida Deed, RN Electronic Signature(s) Signed: 03/23/2023 10:54:18 AM By: Duanne Guess MD FACS Signed: 03/23/2023 5:02:17 PM By: Zenaida Deed RN, BSN Entered By: Zenaida Deed on 03/23/2023 07:35:49 [name], [name] (  [id number]) [id number] Page 3 of 12 -------------------------------------------------------------------------------- HPI Details Patient Name: Date of Service: [name], [name]. 03/23/2023 9:45 [name] Medical Record Number: [id number] Patient Account Number: [id number] Date of Birth/Sex: Treating RN: April 24, 1956 (67 y.o. M) Primary Care Provider: Arva Chafe Other Clinician: Referring Provider: Treating Provider/Extender: [name] in Treatment: 29 History of Present Illness HPI Description: ADMISSION 05/14/2020; this is [name] with multiple medical issues. Predominantly he has type 2 diabetes with [name] and also history of fairly significant PAD. He had [name] in February 2017 he also had an atherectomy and angioplasty by Dr. [name] of the right popliteal artery in 2016. He is supposed to be getting arterial studies annually however this was interrupted last year because of the pandemic. He tells [place] he was at [facility] 2 weeks ago was getting out of of the scooter and traumatized his left lateral lower leg. There was [name] as the patient is on Plavix and Eliquis. They have been  dressing this with Neosporin and doing [name] fairly good job. Wound measures 2.5 x 3.5 it does not have any depth he does not have [name] in his legs outside of surgery however he does have chronic edema and skin changes suggestive of chronic venous disease possibly some degree of lymphedema as well. Past medical history includes type 2 diabetes with peripheral neuropathy and gait instability, lumbar spondylosis, obstructive sleep apnea, history of [name], basal cell skin cancer, atrial fibrillation on anticoagulation, significant PAD as noted with [name] and [name] in 30th 2016. He also has [name] of coronary artery disease with an MI in 2002 hypertension hyperlipidemia and heart failure with preserved ejection fraction His last arterial studies I can see in epic were on 03/10/2018 this showed [name] and [name] with monophasic waveforms on the right. On the left his ABI was 1.20 with [name] and triphasic waveforms. He has not had arterial studies since. Our nurse in the clinic got an ABI on the left of 1.1 11/2; left anterior leg wound in the setting of chronic venous insufficiency. Wound was initially trauma. We have been using Hydrofera Blue under compression he has home health.. The wound looks [name] lot better today with improvement in surface area 11/16; left anterior leg wound in the setting of chronic venous insufficiency. Wound was initially trauma. We have been using Hydrofera Blue under compression. The patient is closed today. He is supposed to follow-up with vein and vascular with regards to arterial insufficiency nevertheless his leg wounds are 12/3; apparently 2 weeks ago when they were putting on their stockings they managed to get 3 wounds on the left anterior lower leg from abrasion when putting on the stockings. Home  health came by the week of Thanksgiving and put Hydrofera Blue 4-layer wrap on this and there is only one superficial area remaining. The patient and his wife complained about the difficulties getting stockings on I think we are using 20/30. We will order bilateral external compression stockings which should be easier. 12/10; wound on the left anterior lower leg is closed. He has chronic venous insufficiency we ordered him Farrow wrap stockings unfortunately he did not bring these in. 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 He says that it has been present since mid December. He is currently residing in [facility] until March and [name] podiatrist has been looking after him there. They have been simply painting the area with Betadine. He has not had any lower extremity arterial studies since 2019, at which time his right ABI was 0.69. Measured in clinic today, it was 0.71. He is not aware of his most recent hemoglobin A1c, but historically he has had exceptionally poor control. On the basis of his right first metatarsal, there is [name] small crescent shaped wound. There is surrounding eschar and callus. There is no malodor or purulent drainage. 09/08/2022: The original wound is smaller today and fairly clean, but there is some discoloration and [name] pulpy texture to the adjacent callus. Underneath this, the tissue is open exposing the fat layer. It looks like perhaps there was [name] crack in the callus and moisture got under the skin and caused breakdown. 09/15/2022: There has been more moisture related tissue breakdown. The callus is very soft and the underlying tissue is more open. 09/28/2022: The wound looks much better. He has done [name] good job keeping it dry. There is some callus overlying much of the wound surface. There is slough on the exposed open areas. 10/14/2022: There has been [name] lot of moisture related tissue breakdown. He is still  forming callus over the top and then it seems that moisture gets underneath the callus and causes tissue damage. There is slough on the exposed wound surface. [place]. 10/21/2022: His foot is less wet, but there is no significant change to his wound. He has developed [name] blister on his left anterior tibial surface. There is no open wound here, but he does have some fairly significant edema. We are planning to apply [name] total contact cast today. 10/23/2022: Here for his obligatory first cast change. He says he has not had any issues wearing the cast or walking in the boot. No detrimental effects on his wound. 10/29/2022: The wound is looking better. Clearly, putting him in the cast has prevented water from getting in under his callus and causing further tissue breakdown. The blister on his left anterior tibial surface has not yet ruptured. Edema control is significantly improved. 11/05/2022: The wound on his right plantar foot is getting better. There is still some callus accumulation around the wounds, but there are just 2 open areas now, [name] very small 1 on the medial aspect of his metatarsal head and [name] little bit larger 1 on the plantar surface. The blister on his left anterior tibial surface is now open with hanging dry skin. 11/12/2022: The left anterior tibial wound is closed. The wound on his right plantar foot is smaller with just [name] little bit of callus around the edges. [name], [name] ([id number]) [id number] Page 4 of 12 11/19/2022: The right plantar foot wound continues to contract. The surface is quite clean. The tiny satellite area on the medial aspect of his foot has closed. 11/26/2022: The wound is smaller again today. He is responding well to the offloading effects of total contact casting. 12/03/2022: The wound is about the same size today. There is [name] little bit of moisture around the perimeter of the wound. No  significant tissue breakdown. 12/10/2022: The wound is smaller today. There has been no moisture-related tissue breakdown of any maceration. 12/21/2022: The wound continues  to contract. Moisture control is excellent. 12/28/2022: The wound is [name] little bit smaller today. 01/05/2023: The wound measures smaller today. Excellent moisture control with zero evidence of maceration. The wound surface is clean with healthy granulation tissue. 01/13/2023: We are leaving his Apligraf in situ for [name] second week. There is no discolored drainage or malodor coming from the site. 01/19/2023: The wound is smaller today. The periwound skin is in good condition without tissue maceration and the healed areas of tissue appear to have improved integrity. 01/27/2023: Continued contraction of the wound. No tissue maceration. The wound surface is flush with the surrounding skin. 02/02/2023: The wound measured [name] little bit smaller today. There is some bruising just adjacent to the wound, but this has not resulted in any tissue breakdown. 02/09/2023: The wound is smaller again today. The area of bruising that was seen last week has resolved and there has been no tissue damage as [name] result. There is some callus accumulation around the wound edges along with minimal soft slough on the surface. 02/16/2023: The wound measured [name] little bit larger today. There is [name] little bit of maceration around the wound edges, which is likely responsible. 02/23/2023: The wound is smaller today and there is epithelium encroaching around the edges. There is no periwound tissue maceration, nor has he accumulated any significant callus. 03/03/2023: His wound continues to contract. There is no maceration nor any significant callus. There is slight slough on the wound surface. 03/09/2023: The wound is [name] little bit smaller again today. He is managing very well in the peg assist offloading insert and there has been no tissue breakdown or periwound maceration as [name] result  of being out of the total contact cast. 03/16/2023: The wound measured smaller again today. He continues to do well using the peg assist offloading insert. 03/23/2023: Although the wound measured the same size today, it visually appears smaller to me. There is good granulation tissue with minimal slough. There is [name] little callus around the edges. Electronic Signature(s) Signed: 03/23/2023 10:46:49 AM By: Duanne Guess MD FACS Entered By: Duanne Guess on 03/23/2023 07:46:48 -------------------------------------------------------------------------------- Physical Exam Details Patient Name: Date of Service: [name]. 03/23/2023 9:45 [name] Medical Record Number: [id number] Patient Account Number: [id number] Date of Birth/Sex: Treating RN: Mar 27, 1956 (67 y.o. M) Primary Care Provider: Arva Chafe Other Clinician: Referring Provider: Treating Provider/Extender: [name] in Treatment: 29 Constitutional Hypertensive, asymptomatic. Bradycardic, asymptomatic. . . no acute distress. Respiratory Normal work of breathing on room air. Notes 03/23/2023: Although the wound measured the same size today, it visually appears smaller to me. There is good granulation tissue with minimal slough. There is [name] little callus around the edges. Electronic Signature(s) Signed: 03/23/2023 10:48:28 AM By: Duanne Guess MD FACS Entered By: Duanne Guess on 03/23/2023 07:48:28 [name], [name] ([id number]) [id number] Page 5 of 12 -------------------------------------------------------------------------------- Physician Orders Details Patient Name: Date of Service: [name], [name]. 03/23/2023 9:45 [name] Medical Record Number: [id number] Patient Account Number: [id number] Date of Birth/Sex: Treating RN: 01-Dec-1955 (67 y.o. [name] Primary Care Provider: Arva Chafe Other Clinician: Referring Provider: Treating Provider/Extender: [name] in Treatment: 29 Verbal / Phone Orders: No Diagnosis Coding ICD-10 Coding Code Description L97.512 Non-pressure chronic ulcer of other part of right foot with fat layer exposed I73.9 Peripheral vascular disease, unspecified I25.10 Atherosclerotic heart disease of native coronary artery without angina pectoris I10 Essential (primary) hypertension E11.65 Type 2 diabetes mellitus with hyperglycemia E11.40  Type 2 diabetes mellitus with diabetic neuropathy, unspecified I63.9 Cerebral infarction, unspecified Follow-up Appointments ppointment in 1 week. - Dr. Lady Gary - room 2 Return [name] Anesthetic (In clinic) Topical Lidocaine 4% applied to wound bed Cellular or Tissue Based Products Wound #2 Right,Plantar Foot Cellular or Tissue Based Product Type: - Epicord #10 Cellular or Tissue Based Product applied to wound bed, secured with steri-strips, cover with Adaptic or Mepitel. (DO NOT REMOVE). Bathing/ Shower/ Hygiene May shower with protection but do not get wound dressing(s) wet. Protect dressing(s) with water repellant cover (for example, large plastic bag) or [name] cast cover and may then take shower. Edema Control - Lymphedema / SCD / Other Elevate legs to the level of the heart or above for 30 minutes daily and/or when sitting for 3-4 times [name] day throughout the day. Avoid standing for long periods of time. Patient to wear own compression stockings every day. Moisturize legs daily. Off-Loading Open toe surgical shoe to: - with peg assist insert to right foot Wound Treatment Wound #2 - Foot Wound Laterality: Plantar, Right Cleanser: Soap and Water 1 x Per Week/30 Days Discharge Instructions: May shower and wash wound with dial antibacterial soap and water prior to dressing change. Cleanser: Wound Cleanser 1 x Per Week/30 Days Discharge Instructions: Cleanse the wound with wound cleanser prior to applying [name] clean dressing using gauze sponges, not tissue or  cotton balls. Peri-Wound Care: Zinc Oxide Ointment 30g tube 1 x Per Week/30 Days Discharge Instructions: Apply Zinc Oxide to periwound with each dressing change Topical: Skintegrity Hydrogel 4 (oz) 1 x Per Week/30 Days Discharge Instructions: Apply hydrogel as directed Prim Dressing: ADAPTIC TOUCH 3x4.25 (in/in) 1 x Per Week/30 Days ary Discharge Instructions: Apply to wound bed as instructed Prim Dressing: EPICORD 1 x Per Week/30 Days ary Discharge Instructions: #6 (02/23/23) Secondary Dressing: Woven Gauze Sponge, Non-Sterile 4x4 in 1 x Per Week/30 Days Discharge Instructions: Apply over primary dressing as directed. [name], [name] ([id number]) [id number] Page 6 of 12 Secondary Dressing: Zetuvit Plus 4x8 in 1 x Per Week/30 Days Discharge Instructions: Apply over primary dressing as directed. Secured With: Elastic Bandage 4 inch (ACE bandage) 1 x Per Week/30 Days Discharge Instructions: Secure with ACE bandage as directed. Secured With: [employer], 4.5x3.1 (in/yd) 1 x Per Week/30 Days Discharge Instructions: Secure with Kerlix as directed. Electronic Signature(s) Signed: 03/23/2023 10:54:18 AM By: Duanne Guess MD FACS Entered By: Duanne Guess on 03/23/2023 07:50:12 -------------------------------------------------------------------------------- Problem List Details Patient Name: Date of Service: [name]. 03/23/2023 9:45 [name] Medical Record Number: [id number] Patient Account Number: [id number] Date of Birth/Sex: Treating RN: 10-05-1955 (67 y.o. [name] Primary Care Provider: Arva Chafe Other Clinician: Referring Provider: Treating Provider/Extender: [name] in Treatment: 29 Active Problems ICD-10 Encounter Code Description Active Date MDM Diagnosis L97.512 Non-pressure chronic ulcer of other part of right foot with fat layer exposed 08/31/2022 No Yes I73.9 Peripheral vascular disease,  unspecified 08/31/2022 No Yes I25.10 Atherosclerotic heart disease of native coronary artery without angina pectoris 08/31/2022 No Yes I10 Essential (primary) hypertension 08/31/2022 No Yes E11.65 Type 2 diabetes mellitus with hyperglycemia 08/31/2022 No Yes E11.40 Type 2 diabetes mellitus with diabetic neuropathy, unspecified 08/31/2022 No Yes I63.9 Cerebral infarction, unspecified 08/31/2022 No Yes Inactive Problems Resolved Problems ICD-10 Code Description Active Date Resolved Date L97.821 Non-pressure chronic ulcer of other part of left lower leg limited to breakdown of skin 11/05/2022 11/05/2022 Mcgillicuddy, Beverley [name] ([id number]) [id number] Page 7 of 12 Electronic Signature(s)  Signed: 03/23/2023 10:43:44 AM By: Duanne Guess MD FACS Entered By: Duanne Guess on 03/23/2023 07:43:44 -------------------------------------------------------------------------------- Progress Note Details Patient Name: Date of Service: [name]. 03/23/2023 9:45 [name] Medical Record Number: [id number] Patient Account Number: [id number] Date of Birth/Sex: Treating RN: 02-08-1956 (67 y.o. M) Primary Care Provider: Arva Chafe Other Clinician: Referring Provider: Treating Provider/Extender: [name] in Treatment: 29 Subjective Chief Complaint Information obtained from Patient 05/14/2020; patient is here for review of an abrasion injury on the left lateral calf 08/31/2022: [name] (1st metatarsal base, plantar) History of Present Illness (HPI) ADMISSION 05/14/2020; this is [name] with multiple medical issues. Predominantly he has type 2 diabetes with [name] and also history of fairly significant PAD. He had [name] in February 2017 he also had an atherectomy and angioplasty by Dr. [name] of the right popliteal artery in 2016. He is supposed to be getting  arterial studies annually however this was interrupted last year because of the pandemic. He tells [place] he was at [facility] 2 weeks ago was getting out of of the scooter and traumatized his left lateral lower leg. There was [name] as the patient is on Plavix and Eliquis. They have been dressing this with Neosporin and doing [name] fairly good job. Wound measures 2.5 x 3.5 it does not have any depth he does not have [name] in his legs outside of surgery however he does have chronic edema and skin changes suggestive of chronic venous disease possibly some degree of lymphedema as well. Past medical history includes type 2 diabetes with peripheral neuropathy and gait instability, lumbar spondylosis, obstructive sleep apnea, history of [name], basal cell skin cancer, atrial fibrillation on anticoagulation, significant PAD as noted with [name] and [name] in 30th 2016. He also has [name] of coronary artery disease with an MI in 2002 hypertension hyperlipidemia and heart failure with preserved ejection fraction His last arterial studies I can see in epic were on 03/10/2018 this showed [name] and [name] with monophasic waveforms on the right. On the left his ABI was 1.20 with [name] and triphasic waveforms. He has not had arterial studies since. Our nurse in the clinic got an ABI on the left of 1.1 11/2; left anterior leg wound in the setting of chronic venous insufficiency. Wound was initially trauma. We have been using Hydrofera Blue under compression he has home health.. The wound looks [name] lot better today with improvement in surface area 11/16; left anterior leg wound in the setting of chronic venous insufficiency. Wound was initially trauma. We have been using Hydrofera Blue under compression. The patient is closed today. He is supposed  to follow-up with vein and vascular with regards to arterial insufficiency nevertheless his leg wounds are 12/3; apparently 2 weeks ago when they were putting on their stockings they managed to get 3 wounds on the left anterior lower leg from abrasion when putting on the stockings. Home health came by the week of Thanksgiving and put Hydrofera Blue 4-layer wrap on this and there is only one superficial area remaining. The patient and his wife complained about the difficulties getting stockings on I think we are using 20/30. We will order bilateral external compression stockings  which should be easier. 12/10; wound on the left anterior lower leg is closed. He has chronic venous insufficiency we ordered him Farrow wrap stockings unfortunately he did not bring these in. 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 He says that it has been present since mid December. He is currently residing in [facility] until March and [name] podiatrist has been looking after him there. They have been simply painting the area with Betadine. He has not had any lower extremity arterial studies since 2019, at which time his right ABI was 0.69. Measured in clinic today, it was 0.71. He is not aware of his most recent hemoglobin A1c, but historically he has had exceptionally poor control. On the basis of his right first metatarsal, there is [name] small crescent shaped wound. There is surrounding eschar and callus. There is no malodor or purulent drainage. 09/08/2022: The original wound is smaller today and fairly clean, but there is some discoloration and [name] pulpy texture to the adjacent callus. Underneath this, the tissue is open exposing the fat layer. It looks like perhaps there was [name] crack in the callus and moisture got under the skin and caused breakdown. 09/15/2022: There has been more moisture related tissue breakdown. The callus is very soft and the underlying  tissue is more open. 09/28/2022: The wound looks much better. He has done [name] good job keeping it dry. There is some callus overlying much of the wound surface. There is slough on the exposed open areas. 10/14/2022: There has been [name] lot of moisture related tissue breakdown. He is still forming callus over the top and then it seems that moisture gets underneath the callus and causes tissue damage. There is slough on the exposed wound surface. [place] 10/21/2022: His foot is less wet, but there is no significant change to his wound. He has developed [name] blister on his left anterior tibial surface. There is no open wound here, but he does have some fairly significant edema. We are planning to apply [name] total contact cast today. [name], [name] ([id number]) [id number] Page 8 of 12 10/23/2022: Here for his obligatory first cast change. He says he has not had any issues wearing the cast or walking in the boot. No detrimental effects on his wound. 10/29/2022: The wound is looking better. Clearly, putting him in the cast has prevented water from getting in under his callus and causing further tissue breakdown. The blister on his left anterior tibial surface has not yet ruptured. Edema control is significantly improved. 11/05/2022: The wound on his right plantar foot is getting better. There is still some callus accumulation around the wounds, but there are just 2 open areas now, [name] very small 1 on the medial aspect of his metatarsal head and [name] little bit larger 1 on the plantar surface. The blister on his left anterior tibial surface is now open with hanging dry skin. 11/12/2022: The left anterior tibial wound is closed. The wound on his right plantar foot is smaller with just [name] little bit of callus around the edges. 11/19/2022: The right plantar foot wound continues to contract. The surface is quite clean. The tiny satellite area on the  medial aspect of his foot has closed. 11/26/2022: The wound is smaller again today. He is responding well to the offloading effects of total contact casting. 12/03/2022: The wound is about the  same size today. There is [name] little bit of moisture around the perimeter of the wound. No significant tissue breakdown. 12/10/2022: The wound is smaller today. There has been no moisture-related tissue breakdown of any maceration. 12/21/2022: The wound continues to contract. Moisture control is excellent. 12/28/2022: The wound is [name] little bit smaller today. 01/05/2023: The wound measures smaller today. Excellent moisture control with zero evidence of maceration. The wound surface is clean with healthy granulation tissue. 01/13/2023: We are leaving his Apligraf in situ for [name] second week. There is no discolored drainage or malodor coming from the site. 01/19/2023: The wound is smaller today. The periwound skin is in good condition without tissue maceration and the healed areas of tissue appear to have improved integrity. 01/27/2023: Continued contraction of the wound. No tissue maceration. The wound surface is flush with the surrounding skin. 02/02/2023: The wound measured [name] little bit smaller today. There is some bruising just adjacent to the wound, but this has not resulted in any tissue breakdown. 02/09/2023: The wound is smaller again today. The area of bruising that was seen last week has resolved and there has been no tissue damage as [name] result. There is some callus accumulation around the wound edges along with minimal soft slough on the surface. 02/16/2023: The wound measured [name] little bit larger today. There is [name] little bit of maceration around the wound edges, which is likely responsible. 02/23/2023: The wound is smaller today and there is epithelium encroaching around the edges. There is no periwound tissue maceration, nor has he accumulated any significant callus. 03/03/2023: His wound continues to contract. There is no  maceration nor any significant callus. There is slight slough on the wound surface. 03/09/2023: The wound is [name] little bit smaller again today. He is managing very well in the peg assist offloading insert and there has been no tissue breakdown or periwound maceration as [name] result of being out of the total contact cast. 03/16/2023: The wound measured smaller again today. He continues to do well using the peg assist offloading insert. 03/23/2023: Although the wound measured the same size today, it visually appears smaller to me. There is good granulation tissue with minimal slough. There is [name] little callus around the edges. Patient History Information obtained from Patient. Family History Unknown History. Social History Never smoker, Marital Status - Single, Alcohol Use - Never, Drug Use - No History, Caffeine Use - Rarely. Medical History Respiratory Patient has history of Sleep Apnea Cardiovascular Patient has history of Arrhythmia - [name]-Fib, Coronary Artery Disease - s/p CABG, Deep Vein Thrombosis, Hypertension, Myocardial Infarction, Peripheral Arterial Disease - s/p Fempop x 2 Endocrine Patient has history of Type II Diabetes Neurologic Patient has history of Neuropathy Hospitalization/Surgery History - colonoscopy. - polypectomy. - peripheral vascular cath. - shoulder arthroscopy. - carpal tunnel release. - coronary artery bypass. - appendectomy. - cardiac cath. - coronary angioplasty. - thrombectomy. - knee arthroplasty. - popliteal artery stent. 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 Bradycardic, asymptomatic. no acute distress. Vitals Time Taken: 9:36 AM, Height: 70 in, Weight: 260 lbs, BMI: 37.3, Temperature: 97.8 F, Pulse: 50 bpm, Respiratory Rate: 18  breaths/min, Blood Pressure: 181/72 mmHg, Capillary Blood Glucose: 164 mg/dl. General Notes: glucose per pt report yesterday Respiratory Normal work of breathing on room air. General Notes: 03/23/2023: Although the wound measured the same size today, it  visually appears smaller to me. There is good granulation tissue with minimal slough. There is [name] little callus around the edges. Integumentary (Hair, Skin) Wound #2 status is Open. Original cause of wound was Gradually Appeared. The date acquired was: 07/08/2022. The wound has been in treatment 29 weeks. The wound is located on the Right,Plantar Foot. The wound measures 0.6cm length x 0.6cm width x 0.1cm depth; 0.283cm^2 area and 0.028cm^3 volume. There is Fat Layer (Subcutaneous Tissue) exposed. There is no tunneling or undermining noted. There is [name] medium amount of serosanguineous drainage noted. The wound margin is distinct with the outline attached to the wound base. There is large (67-100%) red granulation within the wound bed. There is [name] small (1-33%) amount of necrotic tissue within the wound bed including Adherent Slough. The periwound skin appearance had no abnormalities noted for color. The periwound skin appearance exhibited: Callus. The periwound skin appearance did not exhibit: Dry/Scaly, Maceration. Periwound temperature was noted as No Abnormality. Assessment Active Problems ICD-10 Non-pressure chronic ulcer of other part of right foot with fat layer exposed Peripheral vascular disease, unspecified Atherosclerotic heart disease of native coronary artery without angina pectoris Essential (primary) hypertension Type 2 diabetes mellitus with hyperglycemia Type 2 diabetes mellitus with diabetic neuropathy, unspecified Cerebral infarction, unspecified Procedures Wound #2 Pre-procedure diagnosis of Wound #2 is [name] Diabetic Wound/Ulcer of the Lower Extremity located on the Right,Plantar Foot .Severity of Tissue Pre Debridement is: Fat  layer exposed. There was [name] Selective/Open Wound Skin/Epidermis Debridement with [name] total area of 0.28 sq cm performed by Duanne Guess, MD. With the following instrument(s): Curette to remove Non-Viable tissue/material. Material removed includes Callus, Slough, and Skin: Epidermis after achieving pain control using Lidocaine 4% T opical Solution. No specimens were taken. [name] time out was conducted at 10:30, prior to the start of the procedure. [name] Minimum amount of bleeding was controlled with Pressure. The procedure was tolerated well with [name] pain level of 0 throughout and [name] pain level of 0 following the procedure. Post Debridement Measurements: 0.6cm length x 0.6cm width x 0.1cm depth; 0.028cm^3 volume. Character of Wound/Ulcer Post Debridement is stable. Severity of Tissue Post Debridement is: Fat layer exposed. Post procedure Diagnosis Wound #2: Same as Pre-Procedure General Notes: scribed for Dr. Lady Gary by Zenaida Deed, RN. Pre-procedure diagnosis of Wound #2 is [name] Diabetic Wound/Ulcer of the Lower Extremity located on the Right,Plantar Foot. [name] skin graft procedure using [name] bioengineered skin substitute/cellular or tissue based product was performed by Duanne Guess, MD with the following instrument(s): Forceps and Scissors. Epicord was applied and secured with Steri-Strips. 2 sq cm of product was utilized and 4 sq cm was wasted due to wound size. Post Application, adaptic, gauze was applied. [name] Time Out was conducted at 10:35, prior to the start of the procedure. The procedure was tolerated well with [name] pain level of 0 throughout and [name] pain level of 0 following the procedure. Post procedure Diagnosis Wound #2: Same as Pre-Procedure General Notes: scribed for Dr. Lady Gary by Zenaida Deed, RN. Plan Follow-up Appointments: Return Appointment in 1 week. - Dr. Lady Gary - room 2 Anesthetic: (In clinic) Topical Lidocaine 4% applied to wound bed Cellular or Tissue Based Products: Wound #2  Right,Plantar Foot: Cellular or Tissue Based Product Type: - Epicord #10 Liscano, Addam [name] ([id number]) [phone].pdf Page 10 of 12 Cellular or Tissue Based Product applied to wound bed, secured with steri-strips, cover with Adaptic or Mepitel. (DO NOT REMOVE). Bathing/ Shower/ Hygiene: May shower with protection but do not  get wound dressing(s) wet. Protect dressing(s) with water repellant cover (for example, large plastic bag) or [name] cast cover and may then take shower. Edema Control - Lymphedema / SCD / Other: Elevate legs to the level of the heart or above for 30 minutes daily and/or when sitting for 3-4 times [name] day throughout the day. Avoid standing for long periods of time. Patient to wear own compression stockings every day. Moisturize legs daily. Off-Loading: Open toe surgical shoe to: - with peg assist insert to right foot WOUND #2: - Foot Wound Laterality: Plantar, Right Cleanser: Soap and Water 1 x Per Week/30 Days Discharge Instructions: May shower and wash wound with dial antibacterial soap and water prior to dressing change. Cleanser: Wound Cleanser 1 x Per Week/30 Days Discharge Instructions: Cleanse the wound with wound cleanser prior to applying [name] clean dressing using gauze sponges, not tissue or cotton balls. Peri-Wound Care: Zinc Oxide Ointment 30g tube 1 x Per Week/30 Days Discharge Instructions: Apply Zinc Oxide to periwound with each dressing change Topical: Skintegrity Hydrogel 4 (oz) 1 x Per Week/30 Days Discharge Instructions: Apply hydrogel as directed Prim Dressing: ADAPTIC TOUCH 3x4.25 (in/in) 1 x Per Week/30 Days ary Discharge Instructions: Apply to wound bed as instructed Prim Dressing: EPICORD 1 x Per Week/30 Days ary Discharge Instructions: #6 (02/23/23) Secondary Dressing: Woven Gauze Sponge, Non-Sterile 4x4 in 1 x Per Week/30 Days Discharge Instructions: Apply over primary dressing as directed. Secondary Dressing: Zetuvit Plus 4x8 in 1  x Per Week/30 Days Discharge Instructions: Apply over primary dressing as directed. Secured With: Elastic Bandage 4 inch (ACE bandage) 1 x Per Week/30 Days Discharge Instructions: Secure with ACE bandage as directed. Secured With: [employer], 4.5x3.1 (in/yd) 1 x Per Week/30 Days Discharge Instructions: Secure with Kerlix as directed. 03/23/2023: Although the wound measured the same size today, it visually appears smaller to me. There is good granulation tissue with minimal slough. There is [name] little callus around the edges. I used [name] curette to debride callus and slough from the wound. Epicort was then moistened with saline and applied in standard fashion. I applied zinc oxide around the wound edges and covered everything with Adaptic and Steri-Strips to secure it in place. He will continue to wear his peg assist offloading shoe. Follow-up in 1 week. Electronic Signature(s) Signed: 03/23/2023 10:51:37 AM By: Duanne Guess MD FACS Entered By: Duanne Guess on 03/23/2023 07:51:36 -------------------------------------------------------------------------------- HxROS Details Patient Name: Date of Service: [name]. 03/23/2023 9:45 [name] Medical Record Number: [id number] Patient Account Number: [id number] Date of Birth/Sex: Treating RN: 1956/04/22 (67 y.o. M) Primary Care Provider: Arva Chafe Other Clinician: Referring Provider: Treating Provider/Extender: [name] in Treatment: 29 Information Obtained From Patient Respiratory Medical History: Positive for: Sleep Apnea Cardiovascular Medical History: Positive for: Arrhythmia - [name]-Fib; Coronary Artery Disease - s/p CABG; Deep Vein Thrombosis; Hypertension; Myocardial Infarction; Peripheral Arterial Disease - s/p Fempop x 2 Past Medical History Notes: CVA x 3 [name], [name] ([id number]) [phone].pdf Page 11 of 12 Endocrine Medical History: Positive for: Type II  Diabetes Time with diabetes: since 1997 Treated with: Insulin, Oral agents Blood sugar tested every day: Yes Tested : 2-3x per day Musculoskeletal Medical History: Past Medical History Notes: carpal tunnel syndrome Neurologic Medical History: Positive for: Neuropathy Past Medical History Notes: stroke Oncologic Medical History: Past Medical History Notes: skin cancer Immunizations Pneumococcal Vaccine: Received Pneumococcal Vaccination: Yes Received Pneumococcal Vaccination On or After 60th Birthday: Yes Implantable Devices None Hospitalization /  Surgery History Type of Hospitalization/Surgery colonoscopy polypectomy peripheral vascular cath shoulder arthroscopy carpal tunnel release coronary artery bypass appendectomy cardiac cath coronary angioplasty thrombectomy knee arthroplasty popliteal artery stent Family and Social History Unknown History: Yes; Never smoker; Marital Status - Single; Alcohol Use: Never; Drug Use: No History; Caffeine Use: Rarely; Financial Concerns: No; Food, Clothing or Shelter Needs: No; Support System Lacking: No; Transportation Concerns: No Electronic Signature(s) Signed: 03/23/2023 10:54:18 AM By: Duanne Guess MD FACS Entered By: Duanne Guess on 03/23/2023 07:47:28 -------------------------------------------------------------------------------- SuperBill Details Patient Name: Date of Service: [name]. 03/23/2023 Medical Record Number: [id number] Patient Account Number: [id number] Date of Birth/Sex: Treating RN: 10-09-1955 (67 y.o. M) Primary Care Provider: Arva Chafe Other Clinician: Referring Provider: Treating Provider/Extender: [name] in Treatment: [address], [place] [name] ([id number]) [id number] Page 12 of 12 Diagnosis Coding ICD-10 Codes Code Description L97.512 Non-pressure chronic ulcer of other part of right foot with fat layer exposed I73.9  Peripheral vascular disease, unspecified I25.10 Atherosclerotic heart disease of native coronary artery without angina pectoris I10 Essential (primary) hypertension E11.65 Type 2 diabetes mellitus with hyperglycemia E11.40 Type 2 diabetes mellitus with diabetic neuropathy, unspecified I63.9 Cerebral infarction, unspecified Facility Procedures : CPT4 Code: 78295621 Description: Q4187 Epicord 2cm x 3cm - per sqcm Modifier: Quantity: 6 : CPT4 Code: 30865784 Description: 15275 - SKIN SUB GRAFT FACE/NK/HF/G ICD-10 Diagnosis Description L97.512 Non-pressure chronic ulcer of other part of right foot with fat layer exposed Modifier: Quantity: 1 Physician Procedures : CPT4 Code Description Modifier [id number] - WC PHYS LEVEL 3 - EST PT 25 ICD-10 Diagnosis Description L97.512 Non-pressure chronic ulcer of other part of right foot with fat layer exposed E11.40 Type 2 diabetes mellitus with diabetic neuropathy,  unspecified I73.9 Peripheral vascular disease, unspecified E11.65 Type 2 diabetes mellitus with hyperglycemia Quantity: 1 : [id number] 15275 - WC PHYS SKIN SUB GRAFT FACE/NK/HF/G ICD-10 Diagnosis Description L97.512 Non-pressure chronic ulcer of other part of right foot with fat layer exposed Quantity: 1 Electronic Signature(s) Signed: 03/23/2023 10:52:42 AM By: Duanne Guess MD FACS Entered By: Duanne Guess on 03/23/2023 07:52:42

## 2023-03-30 ENCOUNTER — Encounter (HOSPITAL_BASED_OUTPATIENT_CLINIC_OR_DEPARTMENT_OTHER): Payer: Medicare HMO | Admitting: General Surgery

## 2023-03-30 DIAGNOSIS — L97512 Non-pressure chronic ulcer of other part of right foot with fat layer exposed: Secondary | ICD-10-CM | POA: Diagnosis not present

## 2023-03-30 DIAGNOSIS — E11622 Type 2 diabetes mellitus with other skin ulcer: Secondary | ICD-10-CM | POA: Diagnosis not present

## 2023-03-30 DIAGNOSIS — E11621 Type 2 diabetes mellitus with foot ulcer: Secondary | ICD-10-CM | POA: Diagnosis not present

## 2023-03-30 NOTE — Progress Notes (Signed)
[name] ([id number]) [id number] Page 1 of 11 Visit Report for 03/30/2023 Chief Complaint Document Details Patient Name: Date of Service: [name], [name] [name]. 03/30/2023 9:15 [name] Medical Record Number: [id number] Patient Account Number: [id number] Date of Birth/Sex: Treating RN: 11-Jan-1956 (67 y.o. [name]) Primary Care Provider: Arva Chafe Other Clinician: Referring Provider: Treating Provider/Extender: [name] in Treatment: 30 Information Obtained from: Patient Chief Complaint 05/14/2020; patient is here for review of an abrasion injury on the left lateral calf 08/31/2022: [name] (1st metatarsal base, plantar) Electronic Signature(s) Signed: 03/30/2023 9:32:35 AM By: Duanne Guess MD FACS Entered By: Duanne Guess on 03/30/2023 06:32:35 -------------------------------------------------------------------------------- Debridement Details Patient Name: Date of Service: [name] Pian, [name]. 03/30/2023 9:15 [name] Medical Record Number: [id number] Patient Account Number: [id number] Date of Birth/Sex: Treating RN: 06-25-1956 (67 y.o. [name] Primary Care Provider: Arva Chafe Other Clinician: Referring Provider: Treating Provider/Extender: [name] in Treatment: 30 Debridement Performed for Assessment: Wound #2 Right,Plantar Foot Performed By: Physician Duanne Guess, MD The following information was scribed by: Samuella Bruin The information was scribed for: Duanne Guess Debridement Type: Debridement Severity of Tissue Pre Debridement: Fat layer exposed Level of Consciousness (Pre-procedure): Awake and Alert Pre-procedure Verification/Time Out Yes - 09:24 Taken: Start Time: 09:24 Pain Control: Lidocaine 4% Topical Solution Percent of Wound Bed Debrided: 100% T Area Debrided (cm): otal 0.33 Tissue and other material debrided: Non-Viable, Callus, Slough,  Slough Level: Non-Viable Tissue Debridement Description: Selective/Open Wound Instrument: Curette Bleeding: Minimum Hemostasis Achieved: Pressure Response to Treatment: Procedure was tolerated well Level of Consciousness (Post- Awake and Alert procedure): Post Debridement Measurements of Total Wound Length: (cm) 0.6 Width: (cm) 0.7 Depth: (cm) 0.1 Volume: (cm) 0.033 [name], [name] ([id number]) [id number] Page 2 of 11 Character of Wound/Ulcer Post Debridement: Improved Severity of Tissue Post Debridement: Fat layer exposed Post Procedure Diagnosis Same as Pre-procedure Electronic Signature(s) Signed: 03/30/2023 12:01:13 PM By: Duanne Guess MD FACS Signed: 03/30/2023 3:31:30 PM By: Samuella Bruin Entered By: Samuella Bruin on 03/30/2023 06:38:04 -------------------------------------------------------------------------------- HPI Details Patient Name: Date of Service: [name] Pian, [name]. 03/30/2023 9:15 [name] Medical Record Number: [id number] Patient Account Number: [id number] Date of Birth/Sex: Treating RN: March 04, 1956 (67 y.o. [name]) Primary Care Provider: Arva Chafe Other Clinician: Referring Provider: Treating Provider/Extender: [name] in Treatment: 30 History of Present Illness HPI Description: ADMISSION 05/14/2020; this is [name] with multiple medical issues. Predominantly he has type 2 diabetes with [name] and also history of fairly significant PAD. He had [name] in February 2017 he also had an atherectomy and angioplasty by Dr. [name] of the right popliteal artery in 2016. He is supposed to be getting arterial studies annually however this was interrupted last year because of the pandemic. He tells [place] he was at [facility] 2 weeks ago was getting out of of the scooter and traumatized his left lateral lower leg. There was  [name] as the patient is on Plavix and Eliquis. They have been dressing this with Neosporin and doing [name] fairly good job. Wound measures 2.5 x 3.5 it does not have any depth he does not have [name] in his legs outside of surgery however he does have chronic edema and skin changes suggestive of chronic venous disease possibly some degree of lymphedema as well. Past medical history includes type 2 diabetes with peripheral neuropathy and gait  instability, lumbar spondylosis, obstructive sleep apnea, history of [name], basal cell skin cancer, atrial fibrillation on anticoagulation, significant PAD as noted with [name] and [name] in 30th 2016. He also has [name] of coronary artery disease with an MI in 2002 hypertension hyperlipidemia and heart failure with preserved ejection fraction His last arterial studies I can see in epic were on 03/10/2018 this showed [name] and [name] with monophasic waveforms on the right. On the left his ABI was 1.20 with [name] and triphasic waveforms. He has not had arterial studies since. Our nurse in the clinic got an ABI on the left of 1.1 11/2; left anterior leg wound in the setting of chronic venous insufficiency. Wound was initially trauma. We have been using Hydrofera Blue under compression he has home health.. The wound looks [name] lot better today with improvement in surface area 11/16; left anterior leg wound in the setting of chronic venous insufficiency. Wound was initially trauma. We have been using Hydrofera Blue under compression. The patient is closed today. He is supposed to follow-up with vein and vascular with regards to arterial insufficiency nevertheless his leg wounds are 12/3; apparently 2 weeks ago when they were putting on their stockings they managed to get 3 wounds on the left  anterior lower leg from abrasion when putting on the stockings. Home health came by the week of Thanksgiving and put Hydrofera Blue 4-layer wrap on this and there is only one superficial area remaining. The patient and his wife complained about the difficulties getting stockings on I think we are using 20/30. We will order bilateral external compression stockings which should be easier. 12/10; wound on the left anterior lower leg is closed. He has chronic venous insufficiency we ordered him Farrow wrap stockings unfortunately he did not bring these in. 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 He says that it has been present since mid December. He is currently residing in [facility] until March and [name] podiatrist has been looking after him there. They have been simply painting the area with Betadine. He has not had any lower extremity arterial studies since 2019, at which time his right ABI was 0.69. Measured in clinic today, it was 0.71. He is not aware of his most recent hemoglobin A1c, but historically he has had exceptionally poor control. On the basis of his right first metatarsal, there is [name] small crescent shaped wound. There is surrounding eschar and callus. There is no malodor or purulent drainage. 09/08/2022: The original wound is smaller today and fairly clean, but there is some discoloration and [name] pulpy texture to the adjacent callus. Underneath this, the tissue is open exposing the fat layer. It looks like perhaps there was [name] crack in the callus and moisture got under the skin and caused breakdown. 09/15/2022: There has been more moisture related tissue breakdown. The callus is very soft and the underlying tissue is more open. 09/28/2022: The wound looks much better. He has done [name] good job keeping it dry. There is some callus overlying much of the wound surface. There is slough on the exposed open areas. 10/14/2022: There  has been [name] lot of moisture related tissue breakdown. He is still forming callus over the top and then it seems that moisture  gets underneath the callus and causes tissue damage. There is slough on the exposed wound surface. [place]. Goodner, Royale [name] ([id number]) [id number] Page 3 of 11 10/21/2022: His foot is less wet, but there is no significant change to his wound. He has developed [name] blister on his left anterior tibial surface. There is no open wound here, but he does have some fairly significant edema. We are planning to apply [name] total contact cast today. 10/23/2022: Here for his obligatory first cast change. He says he has not had any issues wearing the cast or walking in the boot. No detrimental effects on his wound. 10/29/2022: The wound is looking better. Clearly, putting him in the cast has prevented water from getting in under his callus and causing further tissue breakdown. The blister on his left anterior tibial surface has not yet ruptured. Edema control is significantly improved. 11/05/2022: The wound on his right plantar foot is getting better. There is still some callus accumulation around the wounds, but there are just 2 open areas now, [name] very small 1 on the medial aspect of his metatarsal head and [name] little bit larger 1 on the plantar surface. The blister on his left anterior tibial surface is now open with hanging dry skin. 11/12/2022: The left anterior tibial wound is closed. The wound on his right plantar foot is smaller with just [name] little bit of callus around the edges. 11/19/2022: The right plantar foot wound continues to contract. The surface is quite clean. The tiny satellite area on the medial aspect of his foot has closed. 11/26/2022: The wound is smaller again today. He is responding well to the offloading effects of total contact casting. 12/03/2022: The wound is about the same size today. There is [name]  little bit of moisture around the perimeter of the wound. No significant tissue breakdown. 12/10/2022: The wound is smaller today. There has been no moisture-related tissue breakdown of any maceration. 12/21/2022: The wound continues to contract. Moisture control is excellent. 12/28/2022: The wound is [name] little bit smaller today. 01/05/2023: The wound measures smaller today. Excellent moisture control with zero evidence of maceration. The wound surface is clean with healthy granulation tissue. 01/13/2023: We are leaving his Apligraf in situ for [name] second week. There is no discolored drainage or malodor coming from the site. 01/19/2023: The wound is smaller today. The periwound skin is in good condition without tissue maceration and the healed areas of tissue appear to have improved integrity. 01/27/2023: Continued contraction of the wound. No tissue maceration. The wound surface is flush with the surrounding skin. 02/02/2023: The wound measured [name] little bit smaller today. There is some bruising just adjacent to the wound, but this has not resulted in any tissue breakdown. 02/09/2023: The wound is smaller again today. The area of bruising that was seen last week has resolved and there has been no tissue damage as [name] result. There is some callus accumulation around the wound edges along with minimal soft slough on the surface. 02/16/2023: The wound measured [name] little bit larger today. There is [name] little bit of maceration around the wound edges, which is likely responsible. 02/23/2023: The wound is smaller today and there is epithelium encroaching around the edges. There is no periwound tissue maceration, nor has he accumulated any significant callus. 03/03/2023: His wound continues to contract. There is no maceration nor any significant callus. There is slight slough on the wound surface. 03/09/2023: The  wound is [name] little bit smaller again today. He is managing very well in the peg assist offloading insert and there has  been no tissue breakdown or periwound maceration as [name] result of being out of the total contact cast. 03/16/2023: The wound measured smaller again today. He continues to do well using the peg assist offloading insert. 03/23/2023: Although the wound measured the same size today, it visually appears smaller to me. There is good granulation tissue with minimal slough. There is [name] little callus around the edges. 03/30/2023: The wound is smaller today. The granulation tissue is healthy. There is [name] little bit of callus accumulation around the edges. Electronic Signature(s) Signed: 03/30/2023 9:33:02 AM By: Duanne Guess MD FACS Entered By: Duanne Guess on 03/30/2023 06:33:02 -------------------------------------------------------------------------------- Physical Exam Details Patient Name: Date of Service: [name] Pian, [name]. 03/30/2023 9:15 [name] Medical Record Number: [id number] Patient Account Number: [id number] Date of Birth/Sex: Treating RN: 11/22/1955 (67 y.o. [name]) Primary Care Provider: Arva Chafe Other Clinician: Referring Provider: Treating Provider/Extender: [name] in Treatment: 30 Constitutional Hypertensive, asymptomatic. . . . no acute distress. Respiratory Normal work of breathing on room air. Oubre, Hilding [name] ([id number]) [id number] Page 4 of 11 Notes 03/30/2023: The wound is smaller today. The granulation tissue is healthy. There is [name] little bit of callus accumulation around the edges. Electronic Signature(s) Signed: 03/30/2023 9:33:35 AM By: Duanne Guess MD FACS Entered By: Duanne Guess on 03/30/2023 06:33:35 -------------------------------------------------------------------------------- Physician Orders Details Patient Name: Date of Service: [name] Pian, [name]. 03/30/2023 9:15 [name] Medical Record Number: [id number] Patient Account Number: [id number] Date of Birth/Sex: Treating RN: Mar 10, 1956 (67 y.o. [name] Primary Care Provider: Arva Chafe Other Clinician: Referring Provider: Treating Provider/Extender: [name] in Treatment: 30 The following information was scribed by: Samuella Bruin The information was scribed for: Duanne Guess Verbal / Phone Orders: No Diagnosis Coding ICD-10 Coding Code Description [phone] Non-pressure chronic ulcer of other part of right foot with fat layer exposed I73.9 Peripheral vascular disease, unspecified I25.10 Atherosclerotic heart disease of native coronary artery without angina pectoris I10 Essential (primary) hypertension E11.65 Type 2 diabetes mellitus with hyperglycemia E11.40 Type 2 diabetes mellitus with diabetic neuropathy, unspecified I63.9 Cerebral infarction, unspecified Follow-up Appointments ppointment in 1 week. - Dr. Lady Gary - room 2 Return [name] Anesthetic (In clinic) Topical Lidocaine 4% applied to wound bed Bathing/ Shower/ Hygiene May shower and wash wound with soap and water. Edema Control - Lymphedema / SCD / Other Elevate legs to the level of the heart or above for 30 minutes daily and/or when sitting for 3-4 times [name] day throughout the day. Avoid standing for long periods of time. Patient to wear own compression stockings every day. Moisturize legs daily. Off-Loading Open toe surgical shoe to: - with peg assist insert to right foot Home Health Admit to Home Health for skilled nursing wound care. May utilize formulary equivalent dressing for wound treatment orders unless otherwise specified. New wound care orders this week; continue Home Health for wound care. May utilize formulary equivalent dressing for wound treatment orders unless otherwise specified. Dressing changes to be completed by Home Health on Monday / Wednesday / Friday except when patient has scheduled visit at [facility]. Other Home Health Orders/Instructions: Wound Treatment Wound #2 - Foot Wound  Laterality: Plantar, Right Cleanser: Soap and Water Every Other Day/30 Days Discharge Instructions: May shower and wash wound with dial antibacterial soap and water prior to dressing change. Cleanser: Wound  Cleanser Every Other Day/30 Days Discharge Instructions: Cleanse the wound with wound cleanser prior to applying [name] clean dressing using gauze sponges, not tissue or cotton balls. Seigler, [name] ([id number]) [id number] Page 5 of 11 Peri-Wound Care: Zinc Oxide Ointment 30g tube Every Other Day/30 Days Discharge Instructions: Apply Zinc Oxide to periwound with each dressing change Topical: Skintegrity Hydrogel 4 (oz) Every Other Day/30 Days Discharge Instructions: Apply hydrogel as directed Prim Dressing: Endoform 2x2 in Every Other Day/30 Days ary Discharge Instructions: Moisten with saline Secondary Dressing: Optifoam Non-Adhesive Dressing, 4x4 in Every Other Day/30 Days Discharge Instructions: Apply over primary dressing as directed. Secondary Dressing: Woven Gauze Sponge, Non-Sterile 4x4 in Every Other Day/30 Days Discharge Instructions: Apply over primary dressing as directed. Secured With: Elastic Bandage 4 inch (ACE bandage) Every Other Day/30 Days Discharge Instructions: Secure with ACE bandage as directed. Secured With: [employer], 4.5x3.1 (in/yd) Every Other Day/30 Days Discharge Instructions: Secure with Kerlix as directed. Secured With: 62M Medipore H Soft Cloth Surgical T ape, 4 x 10 (in/yd) Every Other Day/30 Days Discharge Instructions: Secure with tape as directed. Patient Medications llergies: No Known Drug Allergies [name] Notifications Medication Indication Start End 03/30/2023 lidocaine DOSE topical 4 % cream - cream topical Electronic Signature(s) Signed: 03/30/2023 12:01:13 PM By: Duanne Guess MD FACS Signed: 03/30/2023 3:31:30 PM By: Samuella Bruin Entered By: Samuella Bruin on 03/30/2023  06:40:13 -------------------------------------------------------------------------------- Problem List Details Patient Name: Date of Service: [name] Pian, [name]. 03/30/2023 9:15 [name] Medical Record Number: [id number] Patient Account Number: [id number] Date of Birth/Sex: Treating RN: May 12, 1956 (67 y.o. [name]) Primary Care Provider: Arva Chafe Other Clinician: Referring Provider: Treating Provider/Extender: [name] in Treatment: 30 Active Problems ICD-10 Encounter Code Description Active Date MDM Diagnosis L97.512 Non-pressure chronic ulcer of other part of right foot with fat layer exposed 08/31/2022 No Yes I73.9 Peripheral vascular disease, unspecified 08/31/2022 No Yes I25.10 Atherosclerotic heart disease of native coronary artery without angina pectoris 08/31/2022 No Yes I10 Essential (primary) hypertension 08/31/2022 No Yes Colvin, Paras [name] ([id number]) [phone].pdf Page 6 of 11 E11.65 Type 2 diabetes mellitus with hyperglycemia 08/31/2022 No Yes E11.40 Type 2 diabetes mellitus with diabetic neuropathy, unspecified 08/31/2022 No Yes I63.9 Cerebral infarction, unspecified 08/31/2022 No Yes Inactive Problems Resolved Problems ICD-10 Code Description Active Date Resolved Date L97.821 Non-pressure chronic ulcer of other part of left lower leg limited to breakdown of skin 11/05/2022 11/05/2022 Electronic Signature(s) Signed: 03/30/2023 9:31:03 AM By: Duanne Guess MD FACS Entered By: Duanne Guess on 03/30/2023 06:31:03 -------------------------------------------------------------------------------- Progress Note Details Patient Name: Date of Service: [name] Pian, [name]. 03/30/2023 9:15 [name] Medical Record Number: [id number] Patient Account Number: [id number] Date of Birth/Sex: Treating RN: [birth year] (67 y.o. [name]) Primary Care Provider: Arva Chafe Other Clinician: Referring Provider: Treating Provider/Extender: [name] in Treatment: 30 Subjective Chief Complaint Information obtained from Patient 05/14/2020; patient is here for review of an abrasion injury on the left lateral calf 08/31/2022: [name] (1st metatarsal base, plantar) History of Present Illness (HPI) ADMISSION 05/14/2020; this is [name] with multiple medical issues. Predominantly he has type 2 diabetes with [name] and also history of fairly significant PAD. He had [name] in February 2017 he also had an atherectomy and angioplasty by Dr. [name] of the right popliteal artery in 2016. He is supposed to be getting arterial studies annually however this was interrupted last year because of  the pandemic. He tells [place] he was at [facility] 2 weeks ago was getting out of of the scooter and traumatized his left lateral lower leg. There was [name] as the patient is on Plavix and Eliquis. They have been dressing this with Neosporin and doing [name] fairly good job. Wound measures 2.5 x 3.5 it does not have any depth he does not have [name] in his legs outside of surgery however he does have chronic edema and skin changes suggestive of chronic venous disease possibly some degree of lymphedema as well. Past medical history includes type 2 diabetes with peripheral neuropathy and gait instability, lumbar spondylosis, obstructive sleep apnea, history of [name], basal cell skin cancer, atrial fibrillation on anticoagulation, significant PAD as noted with [name] and [name] in 30th 2016. He also has [name] of coronary artery disease with an MI in 2002 hypertension hyperlipidemia and heart failure with preserved ejection fraction His last arterial studies I can see in epic were on 03/10/2018 this  showed [name] and [name] with monophasic waveforms on the right. On the left his ABI was 1.20 with [name] and triphasic waveforms. He has not had arterial studies since. Our nurse in the clinic got an ABI on the left of 1.1 11/2; left anterior leg wound in the setting of chronic venous insufficiency. Wound was initially trauma. We have been using Hydrofera Blue under compression he has home health.. The wound looks [name] lot better today with improvement in surface area 11/16; left anterior leg wound in the setting of chronic venous insufficiency. Wound was initially trauma. We have been using Hydrofera Blue under compression. The patient is closed today. He is supposed to follow-up with vein and vascular with regards to arterial insufficiency nevertheless his leg wounds are 12/3; apparently 2 weeks ago when they were putting on their stockings they managed to get 3 wounds on the left anterior lower leg from abrasion when putting on the stockings. Home health came by the week of Thanksgiving and put Hydrofera Blue 4-layer wrap on this and there is only one superficial area remaining. Latner, Lancer [name] ([id number]) [id number] Page 7 of 11 The patient and his wife complained about the difficulties getting stockings on I think we are using 20/30. We will order bilateral external compression stockings which should be easier. 12/10; wound on the left anterior lower leg is closed. He has chronic venous insufficiency we ordered him Farrow wrap stockings unfortunately he did not bring these in. 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 He says that it has been present since mid December. He is currently residing in [name] until March and [name] podiatrist has been looking after him there. They have been simply painting the area with Betadine. He has not had any lower extremity arterial  studies since 2019, at which time his right ABI was 0.69. Measured in clinic today, it was 0.71. He is not aware of his most recent hemoglobin A1c, but historically he has had exceptionally poor control. On the basis of his right first metatarsal, there is [name] small crescent shaped wound. There is surrounding eschar and callus. There is no malodor or purulent drainage. 09/08/2022: The original wound is smaller today and fairly clean, but there is  some discoloration and [name] pulpy texture to the adjacent callus. Underneath this, the tissue is open exposing the fat layer. It looks like perhaps there was [name] crack in the callus and moisture got under the skin and caused breakdown. 09/15/2022: There has been more moisture related tissue breakdown. The callus is very soft and the underlying tissue is more open. 09/28/2022: The wound looks much better. He has done [name] good job keeping it dry. There is some callus overlying much of the wound surface. There is slough on the exposed open areas. 10/14/2022: There has been [name] lot of moisture related tissue breakdown. He is still forming callus over the top and then it seems that moisture gets underneath the callus and causes tissue damage. There is slough on the exposed wound surface. [place] 10/21/2022: His foot is less wet, but there is no significant change to his wound. He has developed [name] blister on his left anterior tibial surface. There is no open wound here, but he does have some fairly significant edema. We are planning to apply [name] total contact cast today. 10/23/2022: Here for his obligatory first cast change. He says he has not had any issues wearing the cast or walking in the boot. No detrimental effects on his wound. 10/29/2022: The wound is looking better. Clearly, putting him in the cast has prevented water from getting in under his callus and causing further tissue breakdown. The blister on his left  anterior tibial surface has not yet ruptured. Edema control is significantly improved. 11/05/2022: The wound on his right plantar foot is getting better. There is still some callus accumulation around the wounds, but there are just 2 open areas now, [name] very small 1 on the medial aspect of his metatarsal head and [name] little bit larger 1 on the plantar surface. The blister on his left anterior tibial surface is now open with hanging dry skin. 11/12/2022: The left anterior tibial wound is closed. The wound on his right plantar foot is smaller with just [name] little bit of callus around the edges. 11/19/2022: The right plantar foot wound continues to contract. The surface is quite clean. The tiny satellite area on the medial aspect of his foot has closed. 11/26/2022: The wound is smaller again today. He is responding well to the offloading effects of total contact casting. 12/03/2022: The wound is about the same size today. There is [name] little bit of moisture around the perimeter of the wound. No significant tissue breakdown. 12/10/2022: The wound is smaller today. There has been no moisture-related tissue breakdown of any maceration. 12/21/2022: The wound continues to contract. Moisture control is excellent. 12/28/2022: The wound is [name] little bit smaller today. 01/05/2023: The wound measures smaller today. Excellent moisture control with zero evidence of maceration. The wound surface is clean with healthy granulation tissue. 01/13/2023: We are leaving his Apligraf in situ for [name] second week. There is no discolored drainage or malodor coming from the site. 01/19/2023: The wound is smaller today. The periwound skin is in good condition without tissue maceration and the healed areas of tissue appear to have improved integrity. 01/27/2023: Continued contraction of the wound. No tissue maceration. The wound surface is flush with the surrounding skin. 02/02/2023: The wound measured [name] little bit smaller today. There is some bruising  just adjacent to the wound, but this has not resulted in any tissue breakdown. 02/09/2023: The wound is smaller again today. The area  of bruising that was seen last week has resolved and there has been no tissue damage as [name] result. There is some callus accumulation around the wound edges along with minimal soft slough on the surface. 02/16/2023: The wound measured [name] little bit larger today. There is [name] little bit of maceration around the wound edges, which is likely responsible. 02/23/2023: The wound is smaller today and there is epithelium encroaching around the edges. There is no periwound tissue maceration, nor has he accumulated any significant callus. 03/03/2023: His wound continues to contract. There is no maceration nor any significant callus. There is slight slough on the wound surface. 03/09/2023: The wound is [name] little bit smaller again today. He is managing very well in the peg assist offloading insert and there has been no tissue breakdown or periwound maceration as [name] result of being out of the total contact cast. 03/16/2023: The wound measured smaller again today. He continues to do well using the peg assist offloading insert. 03/23/2023: Although the wound measured the same size today, it visually appears smaller to me. There is good granulation tissue with minimal slough. There is [name] little callus around the edges. 03/30/2023: The wound is smaller today. The granulation tissue is healthy. There is [name] little bit of callus accumulation around the edges. Patient History Information obtained from Patient. Family History Unknown History. Maher, [name] ([id number]) [id number] Page 8 of 11 Social History Never smoker, Marital Status - Single, Alcohol Use - Never, Drug Use - No History, Caffeine Use - Rarely. Medical History Respiratory Patient has history of Sleep Apnea Cardiovascular Patient has history of Arrhythmia - [name]-Fib, Coronary Artery Disease - s/p CABG, Deep Vein  Thrombosis, Hypertension, Myocardial Infarction, Peripheral Arterial Disease - s/p Fempop x 2 Endocrine Patient has history of Type II Diabetes Neurologic Patient has history of Neuropathy Hospitalization/Surgery History - colonoscopy. - polypectomy. - peripheral vascular cath. - shoulder arthroscopy. - carpal tunnel release. - coronary artery bypass. - appendectomy. - cardiac cath. - coronary angioplasty. - thrombectomy. - knee arthroplasty. - popliteal artery stent. 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 no acute distress. Vitals Time Taken: 9:02 AM, Height: 70 in, Weight: 260 lbs, BMI: 37.3, Temperature: 97.5 F, Pulse: 62 bpm, Respiratory Rate: 18 breaths/min, Blood Pressure: 195/69 mmHg, Capillary Blood Glucose: 142 mg/dl. Respiratory Normal work of breathing on room air. General Notes: 03/30/2023: The wound is smaller today. The granulation tissue is healthy. There is [name] little bit of callus accumulation around the edges. Integumentary (Hair, Skin) Wound #2 status is Open. Original cause of wound was Gradually Appeared. The date acquired was: 07/08/2022. The wound has been in treatment 30 weeks. The wound is located on the Right,Plantar Foot. The wound measures 0.6cm length x 0.7cm width x 0.1cm depth; 0.33cm^2 area and 0.033cm^3 volume. There is Fat Layer (Subcutaneous Tissue) exposed. There is no tunneling or undermining noted. There is [name] medium amount of serosanguineous drainage noted. The wound margin is distinct with the outline attached to the wound base. There is large (67-100%) red granulation within the wound bed. There is [name] small (1-33%) amount of necrotic tissue within the wound bed including Eschar and Adherent Slough. The periwound skin appearance had no abnormalities noted for color. The periwound skin appearance  exhibited: Callus. The periwound skin appearance did not exhibit: Dry/Scaly, Maceration. Periwound temperature was noted as No Abnormality. Assessment Active Problems ICD-10 Non-pressure chronic ulcer of other  part of right foot with fat layer exposed Peripheral vascular disease, unspecified Atherosclerotic heart disease of native coronary artery without angina pectoris Essential (primary) hypertension Type 2 diabetes mellitus with hyperglycemia Type 2 diabetes mellitus with diabetic neuropathy, unspecified Cerebral infarction, unspecified Procedures Wound #2 Pre-procedure diagnosis of Wound #2 is [name] Diabetic Wound/Ulcer of the Lower Extremity located on the Right,Plantar Foot .Severity of Tissue Pre Debridement is: Fat layer exposed. There was [name] Selective/Open Wound Non-Viable Tissue Debridement with [name] total area of 0.33 sq cm performed by Duanne Guess, MD. With the following instrument(s): Curette to remove Non-Viable tissue/material. Material removed includes Callus and Slough and after achieving pain control Cona, [name] ([id number]) [phone].pdf Page 9 of 11 using Lidocaine 4% T opical Solution. No specimens were taken. [name] time out was conducted at 09:24, prior to the start of the procedure. [name] Minimum amount of bleeding was controlled with Pressure. The procedure was tolerated well. Post Debridement Measurements: 0.6cm length x 0.7cm width x 0.1cm depth; 0.033cm^3 volume. Character of Wound/Ulcer Post Debridement is improved. Severity of Tissue Post Debridement is: Fat layer exposed. Post procedure Diagnosis Wound #2: Same as Pre-Procedure Plan Follow-up Appointments: Return Appointment in 1 week. - Dr. Lady Gary - room 2 Anesthetic: (In clinic) Topical Lidocaine 4% applied to wound bed Bathing/ Shower/ Hygiene: May shower and wash wound with soap and water. Edema Control - Lymphedema / SCD / Other: Elevate legs to the level of the heart or above for 30  minutes daily and/or when sitting for 3-4 times [name] day throughout the day. Avoid standing for long periods of time. Patient to wear own compression stockings every day. Moisturize legs daily. Off-Loading: Open toe surgical shoe to: - with peg assist insert to right foot Home Health: Admit to Home Health for skilled nursing wound care. May utilize formulary equivalent dressing for wound treatment orders unless otherwise specified. New wound care orders this week; continue Home Health for wound care. May utilize formulary equivalent dressing for wound treatment orders unless otherwise specified. Dressing changes to be completed by Home Health on Monday / Wednesday / Friday except when patient has scheduled visit at [facility]. Other Home Health Orders/Instructions: The following medication(s) was prescribed: lidocaine topical 4 % cream cream topical was prescribed at facility WOUND #2: - Foot Wound Laterality: Plantar, Right Cleanser: Soap and Water Every Other Day/30 Days Discharge Instructions: May shower and wash wound with dial antibacterial soap and water prior to dressing change. Cleanser: Wound Cleanser Every Other Day/30 Days Discharge Instructions: Cleanse the wound with wound cleanser prior to applying [name] clean dressing using gauze sponges, not tissue or cotton balls. Peri-Wound Care: Zinc Oxide Ointment 30g tube Every Other Day/30 Days Discharge Instructions: Apply Zinc Oxide to periwound with each dressing change Topical: Skintegrity Hydrogel 4 (oz) Every Other Day/30 Days Discharge Instructions: Apply hydrogel as directed Prim Dressing: Endoform 2x2 in Every Other Day/30 Days ary Discharge Instructions: Moisten with saline Secondary Dressing: Optifoam Non-Adhesive Dressing, 4x4 in Every Other Day/30 Days Discharge Instructions: Apply over primary dressing as directed. Secondary Dressing: Woven Gauze Sponge, Non-Sterile 4x4 in Every Other Day/30 Days Discharge Instructions:  Apply over primary dressing as directed. Secured With: Elastic Bandage 4 inch (ACE bandage) Every Other Day/30 Days Discharge Instructions: Secure with ACE bandage as directed. Secured With: [employer], 4.5x3.1 (in/yd) Every Other Day/30 Days Discharge Instructions: Secure with Kerlix as directed. Secured With: 96M Medipore H Soft Cloth Surgical T ape, 4 x 10 (in/yd) Every Other Day/30 Days  Discharge Instructions: Secure with tape as directed. 03/30/2023: The wound is smaller today. The granulation tissue is healthy. There is [name] little bit of callus accumulation around the edges. I used [name] curette to debride slough and callus from the wound. As we have used his allotment of Epicord, I am going to change the contact layer to endoform. His girlfriend helps him change the dressings at home. We will have her change the dressing every other day. We will continue the peg assist offloading insert. Follow-up in 1 week. Electronic Signature(s) Signed: 03/30/2023 12:01:13 PM By: Duanne Guess MD FACS Signed: 03/30/2023 3:31:30 PM By: Samuella Bruin Previous Signature: 03/30/2023 9:35:32 AM Version By: Duanne Guess MD FACS Entered By: Samuella Bruin on 03/30/2023 06:40:42 -------------------------------------------------------------------------------- HxROS Details Patient Name: Date of Service: [name] Pian, [name]. 03/30/2023 9:15 [name] Medical Record Number: [id number] Patient Account Number: [id number] Date of Birth/Sex: Treating RN: [birth year] (67 y.o. [name], Travell [name] ([id number]) [id number] Page 10 of 11 Primary Care Provider: Arva Chafe Other Clinician: Referring Provider: Treating Provider/Extender: [name] in Treatment: 30 Information Obtained From Patient Respiratory Medical History: Positive for: Sleep Apnea Cardiovascular Medical History: Positive for: Arrhythmia - [name]-Fib; Coronary Artery Disease - s/p  CABG; Deep Vein Thrombosis; Hypertension; Myocardial Infarction; Peripheral Arterial Disease - s/p Fempop x 2 Past Medical History Notes: CVA x 3 Endocrine Medical History: Positive for: Type II Diabetes Time with diabetes: since 1997 Treated with: Insulin, Oral agents Blood sugar tested every day: Yes Tested : 2-3x per day Musculoskeletal Medical History: Past Medical History Notes: carpal tunnel syndrome Neurologic Medical History: Positive for: Neuropathy Past Medical History Notes: stroke Oncologic Medical History: Past Medical History Notes: skin cancer Immunizations Pneumococcal Vaccine: Received Pneumococcal Vaccination: Yes Received Pneumococcal Vaccination On or After 60th Birthday: Yes Implantable Devices None Hospitalization / Surgery History Type of Hospitalization/Surgery colonoscopy polypectomy peripheral vascular cath shoulder arthroscopy carpal tunnel release coronary artery bypass appendectomy cardiac cath coronary angioplasty thrombectomy knee arthroplasty popliteal artery stent Family and Social History Unknown History: Yes; Never smoker; Marital Status - Single; Alcohol Use: Never; Drug Use: No History; Caffeine Use: Rarely; Financial Concerns: No; Food, Clothing or Shelter Needs: No; Support System Lacking: No; Transportation Concerns: No Electronic Signature(s) Signed: 03/30/2023 12:01:13 PM By: Duanne Guess MD FACS Victory, [name] ([id number]) [phone].pdf Page 11 of 11 Signed: 03/30/2023 12:01:13 PM By: Duanne Guess MD FACS Entered By: Duanne Guess on 03/30/2023 06:33:11 -------------------------------------------------------------------------------- SuperBill Details Patient Name: Date of Service: [name] Pian, [name]. 03/30/2023 Medical Record Number: [id number] Patient Account Number: [id number] Date of Birth/Sex: Treating RN: [birth year] (67 y.o. [name]) Primary Care Provider: Arva Chafe Other  Clinician: Referring Provider: Treating Provider/Extender: [name] in Treatment: 30 Diagnosis Coding ICD-10 Codes Code Description [phone] Non-pressure chronic ulcer of other part of right foot with fat layer exposed I73.9 Peripheral vascular disease, unspecified I25.10 Atherosclerotic heart disease of native coronary artery without angina pectoris I10 Essential (primary) hypertension E11.65 Type 2 diabetes mellitus with hyperglycemia E11.40 Type 2 diabetes mellitus with diabetic neuropathy, unspecified I63.9 Cerebral infarction, unspecified Facility Procedures : CPT4 Code: [id number] Description: 97597 - DEBRIDE WOUND 1ST 20 SQ CM OR < ICD-10 Diagnosis Description L97.512 Non-pressure chronic ulcer of other part of right foot with fat layer exposed Modifier: Quantity: 1 Physician Procedures : CPT4 Code Description Modifier [id number] - WC PHYS LEVEL 3 - EST PT 25 ICD-10 Diagnosis Description L97.512 Non-pressure chronic ulcer of other part of right foot with fat layer exposed  I73.9 Peripheral vascular disease, unspecified E11.65 Type 2  diabetes mellitus with hyperglycemia E11.40 Type 2 diabetes mellitus with diabetic neuropathy, unspecified Quantity: 1 : [id number] 97597 - WC PHYS DEBR WO ANESTH 20 SQ CM ICD-10 Diagnosis Description L97.512 Non-pressure chronic ulcer of other part of right foot with fat layer exposed Quantity: 1 Electronic Signature(s) Signed: 03/30/2023 9:35:59 AM By: Duanne Guess MD FACS Entered By: Duanne Guess on 03/30/2023 06:35:59

## 2023-03-30 NOTE — Progress Notes (Signed)
Bin, Ledarrius A [id number]) [id number] Page 1 of 7 Visit Report for 03/30/2023 Arrival Information Details Patient Name: Date of Service: [place], [name] 03/30/2023 9:15 A M Medical Record Number: [id number] Patient Account Number: [id number] Date of Birth/Sex: Treating RN: 1956-02-05 (67 y.o. [name] Primary Care Donivin Wirt: Arva Chafe Other Clinician: Referring Tiyanna Larcom: Treating Jen Eppinger/Extender: [name] in Treatment: 30 Visit Information History Since Last Visit Added or deleted any medications: No Patient Arrived: Ambulatory Any new allergies or adverse reactions: No Arrival Time: 08:59 Had a fall or experienced change in No Accompanied By: partner activities of daily living that may affect Transfer Assistance: None risk of falls: Patient Identification Verified: Yes Signs or symptoms of abuse/neglect since last visito No Secondary Verification Process Completed: Yes Hospitalized since last visit: No Patient Requires Transmission-Based Precautions: No Implantable device outside of the clinic excluding No Patient Has Alerts: No cellular tissue based products placed in the center since last visit: Has Dressing in Place as Prescribed: Yes Pain Present Now: No Electronic Signature(s) Signed: 03/30/2023 3:31:30 PM By: Samuella Bruin Entered By: Samuella Bruin on 03/30/2023 06:02:25 -------------------------------------------------------------------------------- Encounter Discharge Information Details Patient Name: Date of Service: [name], [name] 03/30/2023 9:15 A M Medical Record Number: [id number] Patient Account Number: [id number] Date of Birth/Sex: Treating RN: 05-18-1956 (67 y.o. [name] Primary Care Alley Neils: Arva Chafe Other Clinician: Referring Jeree Delcid: Treating Junette Bernat/Extender: [name] in Treatment: 30 Encounter Discharge  Information Items Post Procedure Vitals Discharge Condition: Stable Temperature (F): 97.5 Ambulatory Status: Ambulatory Pulse (bpm): 62 Discharge Destination: Home Respiratory Rate (breaths/min): 18 Transportation: Private Auto Blood Pressure (mmHg): 195/69 Accompanied By: partner Schedule Follow-up Appointment: Yes Clinical Summary of Care: Patient Declined Electronic Signature(s) Signed: 03/30/2023 3:31:30 PM By: Samuella Bruin Entered By: Samuella Bruin on 03/30/2023 06:41:52 Hawker, Leonardo A ([id number]) [id number] Page 2 of 7 -------------------------------------------------------------------------------- Lower Extremity Assessment Details Patient Name: Date of Service: [place], [name] 03/30/2023 9:15 A M Medical Record Number: [id number] Patient Account Number: [id number] Date of Birth/Sex: Treating RN: 01/04/1956 (67 y.o. [name] Primary Care Newell Wafer: Arva Chafe Other Clinician: Referring Sheba Whaling: Treating Ellington Greenslade/Extender: [name] in Treatment: 30 Edema Assessment Assessed: [name]: No] [name]: No] Edema: [Left: Ye] [Right: s] Calf Left: Right: Point of Measurement: From Medial Instep 38.3 cm Ankle Left: Right: Point of Measurement: From Medial Instep 23.9 cm Vascular Assessment Extremity colors, hair growth, and conditions: Extremity Color: [Right:Normal] Hair Growth on Extremity: [Right:Yes] Temperature of Extremity: [Right:Warm] Capillary Refill: [Right:< 3 seconds] Dependent Rubor: [Right:No No] Electronic Signature(s) Signed: 03/30/2023 3:31:30 PM By: Samuella Bruin Entered By: Samuella Bruin on 03/30/2023 06:02:40 -------------------------------------------------------------------------------- Multi Wound Chart Details Patient Name: Date of Service: [name], [name] 03/30/2023 9:15 A M Medical Record Number: [id number] Patient Account Number: [id number] Date of  Birth/Sex: Treating RN: [birth year] (67 y.o. M) Primary Care Clint Biello: Arva Chafe Other Clinician: Referring Breylin Dom: Treating Blakelee Allington/Extender: [name] in Treatment: 30 Vital Signs Height(in): 70 Capillary Blood Glucose(mg/dl): 623 Weight(lbs): 762 Pulse(bpm): 62 Body Mass Index(BMI): 37.3 Blood Pressure(mmHg): 195/69 Temperature(F): 97.5 Respiratory Rate(breaths/min): 18 [2:Photos:] [N/A:N/A] Right, Plantar Foot N/A N/A Wound Location: Gradually Appeared N/A N/A Wounding Event: Diabetic Wound/Ulcer of the Lower N/A N/A Primary Etiology: Extremity Sleep Apnea, Arrhythmia, Coronary N/A N/A Comorbid History: Artery Disease, Deep Vein Thrombosis, Hypertension, Myocardial Infarction, Peripheral Arterial Disease, Type II Diabetes, Neuropathy 07/08/2022 N/A N/A Date Acquired: 30 N/A N/A Weeks of Treatment: Open N/A N/A Wound Status:  No N/A N/A Wound Recurrence: 0.6x0.7x0.1 N/A N/A Measurements L x W x D (cm) 0.33 N/A N/A A (cm) : rea 0.033 N/A N/A Volume (cm) : 87.10% N/A N/A % Reduction in A rea: 87.10% N/A N/A % Reduction in Volume: Grade 1 N/A N/A Classification: Medium N/A N/A Exudate A mount: Serosanguineous N/A N/A Exudate Type: red, brown N/A N/A Exudate Color: Distinct, outline attached N/A N/A Wound Margin: Large (67-100%) N/A N/A Granulation A mount: Red N/A N/A Granulation Quality: Small (1-33%) N/A N/A Necrotic A mount: Eschar, Adherent Slough N/A N/A Necrotic Tissue: Fat Layer (Subcutaneous Tissue): Yes N/A N/A Exposed Structures: Fascia: No Tendon: No Muscle: No Joint: No Bone: No Medium (34-66%) N/A N/A Epithelialization: Callus: Yes N/A N/A Periwound Skin Texture: Maceration: No N/A N/A Periwound Skin Moisture: Dry/Scaly: No No Abnormalities Noted N/A N/A Periwound Skin Color: No Abnormality N/A N/A Temperature: Treatment Notes Electronic Signature(s) Signed: 03/30/2023 9:32:26  AM By: Duanne Guess MD FACS Entered By: Duanne Guess on 03/30/2023 06:32:26 -------------------------------------------------------------------------------- Multi-Disciplinary Care Plan Details Patient Name: Date of Service: [name], [name] 03/30/2023 9:15 A M Medical Record Number: [id number] Patient Account Number: [id number] Date of Birth/Sex: Treating RN: Oct 02, 1955 (67 y.o. [name] Primary Care Serenitie Vinton: Arva Chafe Other Clinician: Referring Taliesin Hartlage: Treating Bianey Tesoro/Extender: [name] in Treatment: 30 Multidisciplinary Care Plan reviewed with physician Active Inactive Abuse / Safety / Falls / Self Care Management Nursing Diagnoses: Impaired physical mobility Potential for falls Kainz, Linell A ([id number]) [id number] Page 4 of 7 Goals: Patient will not experience any injury related to falls Date Initiated: 08/31/2022 Target Resolution Date: 04/21/2023 Goal Status: Active Patient/caregiver will verbalize/demonstrate measures taken to improve the patient's personal safety Date Initiated: 08/31/2022 Target Resolution Date: 04/21/2023 Goal Status: Active Interventions: Provide education on basic hygiene Provide education on fall prevention Notes: Wound/Skin Impairment Nursing Diagnoses: Impaired tissue integrity Knowledge deficit related to ulceration/compromised skin integrity Goals: Patient/caregiver will verbalize understanding of skin care regimen Date Initiated: 08/31/2022 Target Resolution Date: 04/20/2023 Goal Status: Active Interventions: Assess patient/caregiver ability to obtain necessary supplies Assess patient/caregiver ability to perform ulcer/skin care regimen upon admission and as needed Assess ulceration(s) every visit Treatment Activities: Skin care regimen initiated : 08/31/2022 Topical wound management initiated : 08/31/2022 Notes: Electronic Signature(s) Signed:  03/30/2023 3:31:30 PM By: Samuella Bruin Entered By: Samuella Bruin on 03/30/2023 06:40:19 -------------------------------------------------------------------------------- Pain Assessment Details Patient Name: Date of Service: [name], [name] 03/30/2023 9:15 A M Medical Record Number: [id number] Patient Account Number: [id number] Date of Birth/Sex: Treating RN: September 06, 1955 (67 y.o. [name] Primary Care Mandy Fitzwater: Arva Chafe Other Clinician: Referring Billy Turvey: Treating Allannah Kempen/Extender: [name] in Treatment: 30 Active Problems Location of Pain Severity and Description of Pain Patient Has Paino No Site Locations Rate the pain. Nestler, Asaph A [phone]) [id number] Page 5 of 7 Rate the pain. Current Pain Level: 0 Pain Management and Medication Current Pain Management: Electronic Signature(s) Signed: 03/30/2023 3:31:30 PM By: Samuella Bruin Entered By: Samuella Bruin on 03/30/2023 06:02:36 -------------------------------------------------------------------------------- Patient/Caregiver Education Details Patient Name: Date of Service: [name] RK A. 9/10/2024andnbsp9:15 A M Medical Record Number: [id number] Patient Account Number: [id number] Date of Birth/Gender: Treating RN: Feb 27, 1956 (67 y.o. [name] Primary Care Physician: Arva Chafe Other Clinician: Referring Physician: Treating Physician/Extender: [name] in Treatment: 30 Education Assessment Education Provided To: Patient Education Topics Provided Wound/Skin Impairment: Methods: Explain/Verbal Responses: Reinforcements needed, State content correctly [employer]) Signed: 03/30/2023 3:31:30 PM By: Samuella Bruin  Entered By: Samuella Bruin on 03/30/2023 06:40:29 -------------------------------------------------------------------------------- Wound  Assessment Details Patient Name: Date of Service: TAU, RAMAN A. 03/30/2023 9:15 A M Medical Record Number: [id number] Patient Account Number: [id number] Date of Birth/Sex: Treating RN: 1955-12-12 (67 y.o. [name] Primary Care Nikayla Madaris: Arva Chafe Other Clinician: Referring Travonne Schowalter: Treating Taylour Lietzke/Extender: Ocie Doyne Cornlea, [place] A ([id number]) [id number] Page 6 of 7 Weeks in Treatment: 30 Wound Status Wound Number: 2 Primary Diabetic Wound/Ulcer of the Lower Extremity Etiology: Wound Location: Right, Plantar Foot Wound Open Wounding Event: Gradually Appeared Status: Date Acquired: 07/08/2022 Comorbid Sleep Apnea, Arrhythmia, Coronary Artery Disease, Deep Vein Weeks Of Treatment: 30 History: Thrombosis, Hypertension, Myocardial Infarction, Peripheral Arterial Clustered Wound: No Disease, Type II Diabetes, Neuropathy Photos Wound Measurements Length: (cm) 0.6 Width: (cm) 0.7 Depth: (cm) 0.1 Area: (cm) 0.33 Volume: (cm) 0.033 % Reduction in Area: 87.1% % Reduction in Volume: 87.1% Epithelialization: Medium (34-66%) Tunneling: No Undermining: No Wound Description Classification: Grade 1 Wound Margin: Distinct, outline attached Exudate Amount: Medium Exudate Type: Serosanguineous Exudate Color: red, brown Foul Odor After Cleansing: No Slough/Fibrino Yes Wound Bed Granulation Amount: Large (67-100%) Exposed Structure Granulation Quality: Red Fascia Exposed: No Necrotic Amount: Small (1-33%) Fat Layer (Subcutaneous Tissue) Exposed: Yes Necrotic Quality: Eschar, Adherent Slough Tendon Exposed: No Muscle Exposed: No Joint Exposed: No Bone Exposed: No Periwound Skin Texture Texture Color No Abnormalities Noted: No No Abnormalities Noted: Yes Callus: Yes Temperature / Pain Temperature: No Abnormality Moisture No Abnormalities Noted: No Dry / Scaly: No Maceration: No Treatment Notes Wound  #2 (Foot) Wound Laterality: Plantar, Right Cleanser Soap and Water Discharge Instruction: May shower and wash wound with dial antibacterial soap and water prior to dressing change. Wound Cleanser Discharge Instruction: Cleanse the wound with wound cleanser prior to applying a clean dressing using gauze sponges, not tissue or cotton balls. Peri-Wound Care Zinc Oxide Ointment 30g tube Discharge Instruction: Apply Zinc Oxide to periwound with each dressing change Topical Skintegrity Hydrogel 4 (oz) Bartz, Deano A ([id number]) [id number] Page 7 of 7 Discharge Instruction: Apply hydrogel as directed Primary Dressing Endoform 2x2 in Discharge Instruction: Moisten with saline Secondary Dressing Optifoam Non-Adhesive Dressing, 4x4 in Discharge Instruction: Apply over primary dressing as directed. Woven Gauze Sponge, Non-Sterile 4x4 in Discharge Instruction: Apply over primary dressing as directed. Secured With Elastic Bandage 4 inch (ACE bandage) Discharge Instruction: Secure with ACE bandage as directed. Kerlix Roll Sterile, 4.5x3.1 (in/yd) Discharge Instruction: Secure with Kerlix as directed. 78M Medipore H Soft Cloth Surgical T ape, 4 x 10 (in/yd) Discharge Instruction: Secure with tape as directed. Compression Wrap Compression Stockings Add-Ons Electronic Signature(s) Signed: 03/30/2023 3:31:30 PM By: Samuella Bruin Entered By: Samuella Bruin on 03/30/2023 06:10:01 -------------------------------------------------------------------------------- Vitals Details Patient Name: Date of Service: [name], [name] 03/30/2023 9:15 A M Medical Record Number: [id number] Patient Account Number: [id number] Date of Birth/Sex: Treating RN: 06-Jun-1956 (68 y.o. [name] Primary Care Laparis Durrett: Arva Chafe Other Clinician: Referring Moniqua Engebretsen: Treating Amitai Delaughter/Extender: [name] in Treatment: 30 Vital Signs Time  Taken: 09:02 Temperature (F): 97.5 Height (in): 70 Pulse (bpm): 62 Weight (lbs): 260 Respiratory Rate (breaths/min): 18 Body Mass Index (BMI): 37.3 Blood Pressure (mmHg): 195/69 Capillary Blood Glucose (mg/dl): 595 Reference Range: 80 - 120 mg / dl Electronic Signature(s) Signed: 03/30/2023 3:31:30 PM By: Samuella Bruin Entered By: Samuella Bruin on 03/30/2023 06:03:21

## 2023-04-01 DIAGNOSIS — E1165 Type 2 diabetes mellitus with hyperglycemia: Secondary | ICD-10-CM | POA: Diagnosis not present

## 2023-04-06 ENCOUNTER — Encounter (HOSPITAL_BASED_OUTPATIENT_CLINIC_OR_DEPARTMENT_OTHER): Payer: Medicare HMO | Admitting: General Surgery

## 2023-04-06 DIAGNOSIS — E11621 Type 2 diabetes mellitus with foot ulcer: Secondary | ICD-10-CM | POA: Diagnosis not present

## 2023-04-06 DIAGNOSIS — L97512 Non-pressure chronic ulcer of other part of right foot with fat layer exposed: Secondary | ICD-10-CM | POA: Diagnosis not present

## 2023-04-06 DIAGNOSIS — E11622 Type 2 diabetes mellitus with other skin ulcer: Secondary | ICD-10-CM | POA: Diagnosis not present

## 2023-04-06 NOTE — Progress Notes (Signed)
and/or when sitting for 3-4 times a day throughout the day. Avoid standing for long periods of time. Patient to  wear own compression stockings every day. Moisturize legs daily. Off-Loading: T Contact Cast to Right Lower Extremity otal Removable cast walker boot to: - right leg The following medication(s) was prescribed: lidocaine topical 4 % cream cream topical was prescribed at facility WOUND #2: - Foot Wound Laterality: Plantar, Right Cleanser: Soap and Water Every Other Day/30 Days Discharge Instructions: May shower and wash wound with dial antibacterial soap and water prior to dressing change. Cleanser: Wound Cleanser Every Other Day/30 Days Discharge Instructions: Cleanse the wound with wound cleanser prior to applying a clean dressing using gauze sponges, not tissue or cotton balls. Peri-Wound Care: Zinc Oxide Ointment 30g tube Every Other Day/30 Days Discharge Instructions: Apply Zinc Oxide to periwound with each dressing change Topical: Gentamicin Every Other Day/30 Days Discharge Instructions: As directed by physician Topical: Mupirocin Ointment Every Other Day/30 Days Discharge Instructions: Apply Mupirocin (Bactroban) as instructed Prim Dressing: Maxorb Extra Ag+ Alginate Dressing, 2x2 (in/in) Every Other Day/30 Days ary Discharge Instructions: Apply to wound bed as instructed Secondary Dressing: Woven Gauze Sponge, Non-Sterile 4x4 in Every Other Day/30 Days Discharge Instructions: Apply over primary dressing as directed. Secondary Dressing: Zetuvit Plus 4x8 in Every Other Day/30 Days Discharge Instructions: Apply over primary dressing as directed. Secured With: [employer], 4.5x3.1 (in/yd) Every Other Day/30 Days Discharge Instructions: Secure with Kerlix as directed. Secured With: 29M Medipore H Soft Cloth Surgical T ape, 4 x 10 (in/yd) Every Other Day/30 Days Discharge Instructions: Secure with tape as directed. 04/06/2023: There has been fairly substantial breakdown of the wound. This appears mostly secondary to moisture. It is larger and involves the medial aspect of the  first metatarsal head again. As he has not had assistance with changing his dressing and the wound has broken down considerably, I am going to put him back in a total contact cast. The wound bed was prepared for total contact casting. T opical gentamicin and mupirocin were applied followed by silver alginate and a substantial absorbent layer. The total contact cast was then applied without difficulty. Follow-up in 1 week. Electronic Signature(s) Signed: 04/06/2023 10:45:00 AM By: Duanne Guess MD FACS Entered By: Duanne Guess on 04/06/2023 07:44:59 Truxillo, Kerim A ([id number]) [id number] Page 9 of 11 -------------------------------------------------------------------------------- HxROS Details Patient Name: Date of Service: [place], [name] 04/06/2023 9:15 A M Medical Record Number: [id number] Patient Account Number: [id number] Date of Birth/Sex: Treating RN: [birth year] (67 y.o. M) Primary Care Provider: Arva Chafe Other Clinician: Referring Provider: Treating Provider/Extender: [name] in Treatment: 31 Information Obtained From Patient Respiratory Medical History: Positive for: Sleep Apnea Cardiovascular Medical History: Positive for: Arrhythmia - A-Fib; Coronary Artery Disease - s/p CABG; Deep Vein Thrombosis; Hypertension; Myocardial Infarction; Peripheral Arterial Disease - s/p Fempop x 2 Past Medical History Notes: CVA x 3 Endocrine Medical History: Positive for: Type II Diabetes Time with diabetes: since 1997 Treated with: Insulin, Oral agents Blood sugar tested every day: Yes Tested : 2-3x per day Musculoskeletal Medical History: Past Medical History Notes: carpal tunnel syndrome Neurologic Medical History: Positive for: Neuropathy Past Medical History Notes: stroke Oncologic Medical History: Past Medical History Notes: skin cancer Immunizations Pneumococcal Vaccine: Received Pneumococcal  Vaccination: Yes Received Pneumococcal Vaccination On or After 60th Birthday: Yes Implantable Devices None Hospitalization / Surgery History Type of Hospitalization/Surgery colonoscopy polypectomy peripheral vascular cath shoulder arthroscopy carpal tunnel release coronary artery bypass [place], [place]  and/or when sitting for 3-4 times a day throughout the day. Avoid standing for long periods of time. Patient to  wear own compression stockings every day. Moisturize legs daily. Off-Loading: T Contact Cast to Right Lower Extremity otal Removable cast walker boot to: - right leg The following medication(s) was prescribed: lidocaine topical 4 % cream cream topical was prescribed at facility WOUND #2: - Foot Wound Laterality: Plantar, Right Cleanser: Soap and Water Every Other Day/30 Days Discharge Instructions: May shower and wash wound with dial antibacterial soap and water prior to dressing change. Cleanser: Wound Cleanser Every Other Day/30 Days Discharge Instructions: Cleanse the wound with wound cleanser prior to applying a clean dressing using gauze sponges, not tissue or cotton balls. Peri-Wound Care: Zinc Oxide Ointment 30g tube Every Other Day/30 Days Discharge Instructions: Apply Zinc Oxide to periwound with each dressing change Topical: Gentamicin Every Other Day/30 Days Discharge Instructions: As directed by physician Topical: Mupirocin Ointment Every Other Day/30 Days Discharge Instructions: Apply Mupirocin (Bactroban) as instructed Prim Dressing: Maxorb Extra Ag+ Alginate Dressing, 2x2 (in/in) Every Other Day/30 Days ary Discharge Instructions: Apply to wound bed as instructed Secondary Dressing: Woven Gauze Sponge, Non-Sterile 4x4 in Every Other Day/30 Days Discharge Instructions: Apply over primary dressing as directed. Secondary Dressing: Zetuvit Plus 4x8 in Every Other Day/30 Days Discharge Instructions: Apply over primary dressing as directed. Secured With: [employer], 4.5x3.1 (in/yd) Every Other Day/30 Days Discharge Instructions: Secure with Kerlix as directed. Secured With: 29M Medipore H Soft Cloth Surgical T ape, 4 x 10 (in/yd) Every Other Day/30 Days Discharge Instructions: Secure with tape as directed. 04/06/2023: There has been fairly substantial breakdown of the wound. This appears mostly secondary to moisture. It is larger and involves the medial aspect of the  first metatarsal head again. As he has not had assistance with changing his dressing and the wound has broken down considerably, I am going to put him back in a total contact cast. The wound bed was prepared for total contact casting. T opical gentamicin and mupirocin were applied followed by silver alginate and a substantial absorbent layer. The total contact cast was then applied without difficulty. Follow-up in 1 week. Electronic Signature(s) Signed: 04/06/2023 10:45:00 AM By: Duanne Guess MD FACS Entered By: Duanne Guess on 04/06/2023 07:44:59 [place], Kerim A ([id number]) [id number] Page 9 of 11 -------------------------------------------------------------------------------- HxROS Details Patient Name: Date of Service: [place], [name] 04/06/2023 9:15 A M Medical Record Number: [id number] Patient Account Number: [id number] Date of Birth/Sex: Treating RN: [birth year] (67 y.o. M) Primary Care Provider: Arva Chafe Other Clinician: Referring Provider: Treating Provider/Extender: [name] in Treatment: 31 Information Obtained From Patient Respiratory Medical History: Positive for: Sleep Apnea Cardiovascular Medical History: Positive for: Arrhythmia - A-Fib; Coronary Artery Disease - s/p CABG; Deep Vein Thrombosis; Hypertension; Myocardial Infarction; Peripheral Arterial Disease - s/p Fempop x 2 Past Medical History Notes: CVA x 3 Endocrine Medical History: Positive for: Type II Diabetes Time with diabetes: since 1997 Treated with: Insulin, Oral agents Blood sugar tested every day: Yes Tested : 2-3x per day Musculoskeletal Medical History: Past Medical History Notes: carpal tunnel syndrome Neurologic Medical History: Positive for: Neuropathy Past Medical History Notes: stroke Oncologic Medical History: Past Medical History Notes: skin cancer Immunizations Pneumococcal Vaccine: Received Pneumococcal  Vaccination: Yes Received Pneumococcal Vaccination On or After 60th Birthday: Yes Implantable Devices None Hospitalization / Surgery History Type of Hospitalization/Surgery colonoscopy polypectomy peripheral vascular cath shoulder arthroscopy carpal tunnel release coronary artery bypass [place], [place]  Hypertensive, asymptomatic. . . . no acute distress. Respiratory Normal work of breathing on room air. Notes 04/06/2023: There has been fairly substantial breakdown of the wound. This appears mostly secondary to moisture. It is larger and involves the medial aspect of the first metatarsal head again. Electronic Signature(s) Signed: 04/06/2023 10:43:43 AM By:  Duanne Guess MD FACS Entered By: Duanne Guess on 04/06/2023 07:43:43 -------------------------------------------------------------------------------- Physician Orders Details Patient Name: Date of Service: [name] Pian, [name] 04/06/2023 9:15 A M Medical Record Number: [id number] Patient Account Number: [id number] Date of Birth/Sex: Treating RN: [birth year] (67 y.o. [name] Primary Care Provider: Arva Chafe Other Clinician: Referring Provider: Treating Provider/Extender: [name] in Treatment: 31 The following information was scribed by: Samuella Bruin The information was scribed for: Duanne Guess Verbal / Phone Orders: No Diagnosis Coding ICD-10 Coding Code Description [phone] Non-pressure chronic ulcer of other part of right foot with fat layer exposed I73.9 Peripheral vascular disease, unspecified I25.10 Atherosclerotic heart disease of native coronary artery without angina pectoris I10 Essential (primary) hypertension E11.65 Type 2 diabetes mellitus with hyperglycemia E11.40 Type 2 diabetes mellitus with diabetic neuropathy, unspecified I63.9 Cerebral infarction, unspecified Follow-up Appointments ppointment in 1 week. - Dr. Lady Gary - room 2 TCC Return A Anesthetic (In clinic) Topical Lidocaine 4% applied to wound bed Bathing/ Shower/ Hygiene May shower with protection but do not get wound dressing(s) wet. Protect dressing(s) with water repellant cover (for example, large plastic bag) or a cast cover and may then take shower. DECARI, KOPPLIN A ([id number]) [id number] Page 4 of 11 Edema Control - Lymphedema / SCD / Other Elevate legs to the level of the heart or above for 30 minutes daily and/or when sitting for 3-4 times a day throughout the day. Avoid standing for long periods of time. Patient to wear own compression stockings every day. Moisturize legs daily. Off-Loading Total Contact  Cast to Right Lower Extremity Removable cast walker boot to: - right leg Wound Treatment Wound #2 - Foot Wound Laterality: Plantar, Right Cleanser: Soap and Water Every Other Day/30 Days Discharge Instructions: May shower and wash wound with dial antibacterial soap and water prior to dressing change. Cleanser: Wound Cleanser Every Other Day/30 Days Discharge Instructions: Cleanse the wound with wound cleanser prior to applying a clean dressing using gauze sponges, not tissue or cotton balls. Peri-Wound Care: Zinc Oxide Ointment 30g tube Every Other Day/30 Days Discharge Instructions: Apply Zinc Oxide to periwound with each dressing change Topical: Gentamicin Every Other Day/30 Days Discharge Instructions: As directed by physician Topical: Mupirocin Ointment Every Other Day/30 Days Discharge Instructions: Apply Mupirocin (Bactroban) as instructed Prim Dressing: Maxorb Extra Ag+ Alginate Dressing, 2x2 (in/in) Every Other Day/30 Days ary Discharge Instructions: Apply to wound bed as instructed Secondary Dressing: Woven Gauze Sponge, Non-Sterile 4x4 in Every Other Day/30 Days Discharge Instructions: Apply over primary dressing as directed. Secondary Dressing: Zetuvit Plus 4x8 in Every Other Day/30 Days Discharge Instructions: Apply over primary dressing as directed. Secured With: [employer], 4.5x3.1 (in/yd) Every Other Day/30 Days Discharge Instructions: Secure with Kerlix as directed. Secured With: 48M Medipore H Soft Cloth Surgical T ape, 4 x 10 (in/yd) Every Other Day/30 Days Discharge Instructions: Secure with tape as directed. Patient Medications llergies: No Known Drug Allergies A Notifications Medication Indication Start End 04/06/2023 lidocaine DOSE topical 4 % cream - cream topical Electronic Signature(s) Signed: 04/06/2023 12:52:29 PM By: Duanne Guess MD FACS Entered By: Duanne Guess on 04/06/2023  07:44:01 -------------------------------------------------------------------------------- Problem List Details Patient Name:  Hypertensive, asymptomatic. . . . no acute distress. Respiratory Normal work of breathing on room air. Notes 04/06/2023: There has been fairly substantial breakdown of the wound. This appears mostly secondary to moisture. It is larger and involves the medial aspect of the first metatarsal head again. Electronic Signature(s) Signed: 04/06/2023 10:43:43 AM By:  Duanne Guess MD FACS Entered By: Duanne Guess on 04/06/2023 07:43:43 -------------------------------------------------------------------------------- Physician Orders Details Patient Name: Date of Service: [name] Pian, [name] 04/06/2023 9:15 A M Medical Record Number: [id number] Patient Account Number: [id number] Date of Birth/Sex: Treating RN: [birth year] (67 y.o. [name] Primary Care Provider: Arva Chafe Other Clinician: Referring Provider: Treating Provider/Extender: [name] in Treatment: 31 The following information was scribed by: Samuella Bruin The information was scribed for: Duanne Guess Verbal / Phone Orders: No Diagnosis Coding ICD-10 Coding Code Description [phone] Non-pressure chronic ulcer of other part of right foot with fat layer exposed I73.9 Peripheral vascular disease, unspecified I25.10 Atherosclerotic heart disease of native coronary artery without angina pectoris I10 Essential (primary) hypertension E11.65 Type 2 diabetes mellitus with hyperglycemia E11.40 Type 2 diabetes mellitus with diabetic neuropathy, unspecified I63.9 Cerebral infarction, unspecified Follow-up Appointments ppointment in 1 week. - Dr. Lady Gary - room 2 TCC Return A Anesthetic (In clinic) Topical Lidocaine 4% applied to wound bed Bathing/ Shower/ Hygiene May shower with protection but do not get wound dressing(s) wet. Protect dressing(s) with water repellant cover (for example, large plastic bag) or a cast cover and may then take shower. DECARI, KOPPLIN A ([id number]) [id number] Page 4 of 11 Edema Control - Lymphedema / SCD / Other Elevate legs to the level of the heart or above for 30 minutes daily and/or when sitting for 3-4 times a day throughout the day. Avoid standing for long periods of time. Patient to wear own compression stockings every day. Moisturize legs daily. Off-Loading Total Contact  Cast to Right Lower Extremity Removable cast walker boot to: - right leg Wound Treatment Wound #2 - Foot Wound Laterality: Plantar, Right Cleanser: Soap and Water Every Other Day/30 Days Discharge Instructions: May shower and wash wound with dial antibacterial soap and water prior to dressing change. Cleanser: Wound Cleanser Every Other Day/30 Days Discharge Instructions: Cleanse the wound with wound cleanser prior to applying a clean dressing using gauze sponges, not tissue or cotton balls. Peri-Wound Care: Zinc Oxide Ointment 30g tube Every Other Day/30 Days Discharge Instructions: Apply Zinc Oxide to periwound with each dressing change Topical: Gentamicin Every Other Day/30 Days Discharge Instructions: As directed by physician Topical: Mupirocin Ointment Every Other Day/30 Days Discharge Instructions: Apply Mupirocin (Bactroban) as instructed Prim Dressing: Maxorb Extra Ag+ Alginate Dressing, 2x2 (in/in) Every Other Day/30 Days ary Discharge Instructions: Apply to wound bed as instructed Secondary Dressing: Woven Gauze Sponge, Non-Sterile 4x4 in Every Other Day/30 Days Discharge Instructions: Apply over primary dressing as directed. Secondary Dressing: Zetuvit Plus 4x8 in Every Other Day/30 Days Discharge Instructions: Apply over primary dressing as directed. Secured With: [employer], 4.5x3.1 (in/yd) Every Other Day/30 Days Discharge Instructions: Secure with Kerlix as directed. Secured With: 48M Medipore H Soft Cloth Surgical T ape, 4 x 10 (in/yd) Every Other Day/30 Days Discharge Instructions: Secure with tape as directed. Patient Medications llergies: No Known Drug Allergies A Notifications Medication Indication Start End 04/06/2023 lidocaine DOSE topical 4 % cream - cream topical Electronic Signature(s) Signed: 04/06/2023 12:52:29 PM By: Duanne Guess MD FACS Entered By: Duanne Guess on 04/06/2023  07:44:01 -------------------------------------------------------------------------------- Problem List Details Patient Name:  Date of Service: [place], [name] 04/06/2023 9:15 A M Medical Record Number: [id number] Patient Account Number: [id number] Date of Birth/Sex: Treating RN: October 12, 1955 (67 y.o. M) Primary Care Provider: Arva Chafe Other Clinician: Referring Provider: Treating Provider/Extender: [name] in Treatment: 31 Active Problems Hoskin, Decklyn A ([id number]) [id number] Page 5 of 11 ICD-10 Encounter Code Description Active Date MDM Diagnosis L97.512 Non-pressure chronic ulcer of other part of right foot with fat layer exposed 08/31/2022 No Yes I73.9 Peripheral vascular disease, unspecified 08/31/2022 No Yes I25.10 Atherosclerotic heart disease of native coronary artery without angina pectoris 08/31/2022 No Yes I10 Essential (primary) hypertension 08/31/2022 No Yes E11.65 Type 2 diabetes mellitus with hyperglycemia 08/31/2022 No Yes E11.40 Type 2 diabetes mellitus with diabetic neuropathy, unspecified 08/31/2022 No Yes I63.9 Cerebral infarction, unspecified 08/31/2022 No Yes Inactive Problems Resolved Problems ICD-10 Code Description Active Date Resolved Date L97.821 Non-pressure chronic ulcer of other part of left lower leg limited to breakdown of skin 11/05/2022 11/05/2022 Electronic Signature(s) Signed: 04/06/2023 10:34:24 AM By: Duanne Guess MD FACS Entered By: Duanne Guess on 04/06/2023 07:34:24 -------------------------------------------------------------------------------- Progress Note Details Patient Name: Date of Service: [name] Pian, [name] 04/06/2023 9:15 A M Medical Record Number: [id number] Patient Account Number: [id number] Date of Birth/Sex: Treating RN: 1955/09/02 (67 y.o. M) Primary Care Provider: Arva Chafe Other Clinician: Referring Provider: Treating Provider/Extender: [name] in Treatment: 31 Subjective Chief Complaint Information obtained from Patient 05/14/2020; patient is here for review of an abrasion injury on the left lateral calf 08/31/2022: [name] (1st metatarsal base, plantar) History of Present Illness (HPI) ADMISSION 05/14/2020; this is a 67 year old man with multiple medical issues. Predominantly he has type 2 diabetes with a history of peripheral neuropathy and also history of fairly significant PAD. He had a left superficial femoral to posterior tibial artery bypass in February 2017 he also had an atherectomy and angioplasty by Dr. [name] of the right popliteal artery in 2016. He is supposed to be getting arterial studies annually however this was interrupted last year because of the pandemic. RONI, STARR A ([id number]) [id number] Page 6 of 11 He tells [place] he was at [facility] 2 weeks ago was getting out of of the scooter and traumatized his left lateral lower leg. There was a lot of bleeding as the patient is on Plavix and Eliquis. They have been dressing this with Neosporin and doing a fairly good job. Wound measures 2.5 x 3.5 it does not have any depth he does not have a wound history in his legs outside of surgery however he does have chronic edema and skin changes suggestive of chronic venous disease possibly some degree of lymphedema as well. Past medical history includes type 2 diabetes with peripheral neuropathy and gait instability, lumbar spondylosis, obstructive sleep apnea, history of a left pontine CVA, basal cell skin cancer, atrial fibrillation on anticoagulation, significant PAD as noted with a left superficial femoral to posterior tibial bypass in February 2017 and a right popliteal atherectomy and angioplasty by Dr. [name] in 30th 2016. He also has a history of coronary artery disease with an MI in 2002 hypertension hyperlipidemia and heart failure with preserved ejection  fraction His last arterial studies I can see in epic were on 03/10/2018 this showed a right ABI of 0.69 and a right TBI of 0.5 with monophasic waveforms on the right. On the left his ABI was 1.20 with a TBI of 0.92 and triphasic waveforms. He has not had arterial studies since.  Date of Service: [place], [name] 04/06/2023 9:15 A M Medical Record Number: [id number] Patient Account Number: [id number] Date of Birth/Sex: Treating RN: October 12, 1955 (67 y.o. M) Primary Care Provider: Arva Chafe Other Clinician: Referring Provider: Treating Provider/Extender: [name] in Treatment: 31 Active Problems Hoskin, Decklyn A ([id number]) [id number] Page 5 of 11 ICD-10 Encounter Code Description Active Date MDM Diagnosis L97.512 Non-pressure chronic ulcer of other part of right foot with fat layer exposed 08/31/2022 No Yes I73.9 Peripheral vascular disease, unspecified 08/31/2022 No Yes I25.10 Atherosclerotic heart disease of native coronary artery without angina pectoris 08/31/2022 No Yes I10 Essential (primary) hypertension 08/31/2022 No Yes E11.65 Type 2 diabetes mellitus with hyperglycemia 08/31/2022 No Yes E11.40 Type 2 diabetes mellitus with diabetic neuropathy, unspecified 08/31/2022 No Yes I63.9 Cerebral infarction, unspecified 08/31/2022 No Yes Inactive Problems Resolved Problems ICD-10 Code Description Active Date Resolved Date L97.821 Non-pressure chronic ulcer of other part of left lower leg limited to breakdown of skin 11/05/2022 11/05/2022 Electronic Signature(s) Signed: 04/06/2023 10:34:24 AM By: Duanne Guess MD FACS Entered By: Duanne Guess on 04/06/2023 07:34:24 -------------------------------------------------------------------------------- Progress Note Details Patient Name: Date of Service: [name] Pian, [name] 04/06/2023 9:15 A M Medical Record Number: [id number] Patient Account Number: [id number] Date of Birth/Sex: Treating RN: 1955/09/02 (67 y.o. M) Primary Care Provider: Arva Chafe Other Clinician: Referring Provider: Treating Provider/Extender: [name] in Treatment: 31 Subjective Chief Complaint Information obtained from Patient 05/14/2020; patient is here for review of an abrasion injury on the left lateral calf 08/31/2022: [name] (1st metatarsal base, plantar) History of Present Illness (HPI) ADMISSION 05/14/2020; this is a 67 year old man with multiple medical issues. Predominantly he has type 2 diabetes with a history of peripheral neuropathy and also history of fairly significant PAD. He had a left superficial femoral to posterior tibial artery bypass in February 2017 he also had an atherectomy and angioplasty by Dr. [name] of the right popliteal artery in 2016. He is supposed to be getting arterial studies annually however this was interrupted last year because of the pandemic. RONI, STARR A ([id number]) [id number] Page 6 of 11 He tells [place] he was at [facility] 2 weeks ago was getting out of of the scooter and traumatized his left lateral lower leg. There was a lot of bleeding as the patient is on Plavix and Eliquis. They have been dressing this with Neosporin and doing a fairly good job. Wound measures 2.5 x 3.5 it does not have any depth he does not have a wound history in his legs outside of surgery however he does have chronic edema and skin changes suggestive of chronic venous disease possibly some degree of lymphedema as well. Past medical history includes type 2 diabetes with peripheral neuropathy and gait instability, lumbar spondylosis, obstructive sleep apnea, history of a left pontine CVA, basal cell skin cancer, atrial fibrillation on anticoagulation, significant PAD as noted with a left superficial femoral to posterior tibial bypass in February 2017 and a right popliteal atherectomy and angioplasty by Dr. [name] in 30th 2016. He also has a history of coronary artery disease with an MI in 2002 hypertension hyperlipidemia and heart failure with preserved ejection  fraction His last arterial studies I can see in epic were on 03/10/2018 this showed a right ABI of 0.69 and a right TBI of 0.5 with monophasic waveforms on the right. On the left his ABI was 1.20 with a TBI of 0.92 and triphasic waveforms. He has not had arterial studies since.  Hypertensive, asymptomatic. . . . no acute distress. Respiratory Normal work of breathing on room air. Notes 04/06/2023: There has been fairly substantial breakdown of the wound. This appears mostly secondary to moisture. It is larger and involves the medial aspect of the first metatarsal head again. Electronic Signature(s) Signed: 04/06/2023 10:43:43 AM By:  Duanne Guess MD FACS Entered By: Duanne Guess on 04/06/2023 07:43:43 -------------------------------------------------------------------------------- Physician Orders Details Patient Name: Date of Service: [name] Pian, [name] 04/06/2023 9:15 A M Medical Record Number: [id number] Patient Account Number: [id number] Date of Birth/Sex: Treating RN: [birth year] (67 y.o. [name] Primary Care Provider: Arva Chafe Other Clinician: Referring Provider: Treating Provider/Extender: [name] in Treatment: 31 The following information was scribed by: Samuella Bruin The information was scribed for: Duanne Guess Verbal / Phone Orders: No Diagnosis Coding ICD-10 Coding Code Description [phone] Non-pressure chronic ulcer of other part of right foot with fat layer exposed I73.9 Peripheral vascular disease, unspecified I25.10 Atherosclerotic heart disease of native coronary artery without angina pectoris I10 Essential (primary) hypertension E11.65 Type 2 diabetes mellitus with hyperglycemia E11.40 Type 2 diabetes mellitus with diabetic neuropathy, unspecified I63.9 Cerebral infarction, unspecified Follow-up Appointments ppointment in 1 week. - Dr. Lady Gary - room 2 TCC Return A Anesthetic (In clinic) Topical Lidocaine 4% applied to wound bed Bathing/ Shower/ Hygiene May shower with protection but do not get wound dressing(s) wet. Protect dressing(s) with water repellant cover (for example, large plastic bag) or a cast cover and may then take shower. DECARI, KOPPLIN A ([id number]) [id number] Page 4 of 11 Edema Control - Lymphedema / SCD / Other Elevate legs to the level of the heart or above for 30 minutes daily and/or when sitting for 3-4 times a day throughout the day. Avoid standing for long periods of time. Patient to wear own compression stockings every day. Moisturize legs daily. Off-Loading Total Contact  Cast to Right Lower Extremity Removable cast walker boot to: - right leg Wound Treatment Wound #2 - Foot Wound Laterality: Plantar, Right Cleanser: Soap and Water Every Other Day/30 Days Discharge Instructions: May shower and wash wound with dial antibacterial soap and water prior to dressing change. Cleanser: Wound Cleanser Every Other Day/30 Days Discharge Instructions: Cleanse the wound with wound cleanser prior to applying a clean dressing using gauze sponges, not tissue or cotton balls. Peri-Wound Care: Zinc Oxide Ointment 30g tube Every Other Day/30 Days Discharge Instructions: Apply Zinc Oxide to periwound with each dressing change Topical: Gentamicin Every Other Day/30 Days Discharge Instructions: As directed by physician Topical: Mupirocin Ointment Every Other Day/30 Days Discharge Instructions: Apply Mupirocin (Bactroban) as instructed Prim Dressing: Maxorb Extra Ag+ Alginate Dressing, 2x2 (in/in) Every Other Day/30 Days ary Discharge Instructions: Apply to wound bed as instructed Secondary Dressing: Woven Gauze Sponge, Non-Sterile 4x4 in Every Other Day/30 Days Discharge Instructions: Apply over primary dressing as directed. Secondary Dressing: Zetuvit Plus 4x8 in Every Other Day/30 Days Discharge Instructions: Apply over primary dressing as directed. Secured With: [employer], 4.5x3.1 (in/yd) Every Other Day/30 Days Discharge Instructions: Secure with Kerlix as directed. Secured With: 48M Medipore H Soft Cloth Surgical T ape, 4 x 10 (in/yd) Every Other Day/30 Days Discharge Instructions: Secure with tape as directed. Patient Medications llergies: No Known Drug Allergies A Notifications Medication Indication Start End 04/06/2023 lidocaine DOSE topical 4 % cream - cream topical Electronic Signature(s) Signed: 04/06/2023 12:52:29 PM By: Duanne Guess MD FACS Entered By: Duanne Guess on 04/06/2023  07:44:01 -------------------------------------------------------------------------------- Problem List Details Patient Name:  Date of Service: [place], [name] 04/06/2023 9:15 A M Medical Record Number: [id number] Patient Account Number: [id number] Date of Birth/Sex: Treating RN: October 12, 1955 (67 y.o. M) Primary Care Provider: Arva Chafe Other Clinician: Referring Provider: Treating Provider/Extender: [name] in Treatment: 31 Active Problems Hoskin, Decklyn A ([id number]) [id number] Page 5 of 11 ICD-10 Encounter Code Description Active Date MDM Diagnosis L97.512 Non-pressure chronic ulcer of other part of right foot with fat layer exposed 08/31/2022 No Yes I73.9 Peripheral vascular disease, unspecified 08/31/2022 No Yes I25.10 Atherosclerotic heart disease of native coronary artery without angina pectoris 08/31/2022 No Yes I10 Essential (primary) hypertension 08/31/2022 No Yes E11.65 Type 2 diabetes mellitus with hyperglycemia 08/31/2022 No Yes E11.40 Type 2 diabetes mellitus with diabetic neuropathy, unspecified 08/31/2022 No Yes I63.9 Cerebral infarction, unspecified 08/31/2022 No Yes Inactive Problems Resolved Problems ICD-10 Code Description Active Date Resolved Date L97.821 Non-pressure chronic ulcer of other part of left lower leg limited to breakdown of skin 11/05/2022 11/05/2022 Electronic Signature(s) Signed: 04/06/2023 10:34:24 AM By: Duanne Guess MD FACS Entered By: Duanne Guess on 04/06/2023 07:34:24 -------------------------------------------------------------------------------- Progress Note Details Patient Name: Date of Service: [name] Pian, [name] 04/06/2023 9:15 A M Medical Record Number: [id number] Patient Account Number: [id number] Date of Birth/Sex: Treating RN: 1955/09/02 (67 y.o. M) Primary Care Provider: Arva Chafe Other Clinician: Referring Provider: Treating Provider/Extender: [name] in Treatment: 31 Subjective Chief Complaint Information obtained from Patient 05/14/2020; patient is here for review of an abrasion injury on the left lateral calf 08/31/2022: [name] (1st metatarsal base, plantar) History of Present Illness (HPI) ADMISSION 05/14/2020; this is a 67 year old man with multiple medical issues. Predominantly he has type 2 diabetes with a history of peripheral neuropathy and also history of fairly significant PAD. He had a left superficial femoral to posterior tibial artery bypass in February 2017 he also had an atherectomy and angioplasty by Dr. [name] of the right popliteal artery in 2016. He is supposed to be getting arterial studies annually however this was interrupted last year because of the pandemic. RONI, STARR A ([id number]) [id number] Page 6 of 11 He tells [place] he was at [facility] 2 weeks ago was getting out of of the scooter and traumatized his left lateral lower leg. There was a lot of bleeding as the patient is on Plavix and Eliquis. They have been dressing this with Neosporin and doing a fairly good job. Wound measures 2.5 x 3.5 it does not have any depth he does not have a wound history in his legs outside of surgery however he does have chronic edema and skin changes suggestive of chronic venous disease possibly some degree of lymphedema as well. Past medical history includes type 2 diabetes with peripheral neuropathy and gait instability, lumbar spondylosis, obstructive sleep apnea, history of a left pontine CVA, basal cell skin cancer, atrial fibrillation on anticoagulation, significant PAD as noted with a left superficial femoral to posterior tibial bypass in February 2017 and a right popliteal atherectomy and angioplasty by Dr. [name] in 30th 2016. He also has a history of coronary artery disease with an MI in 2002 hypertension hyperlipidemia and heart failure with preserved ejection  fraction His last arterial studies I can see in epic were on 03/10/2018 this showed a right ABI of 0.69 and a right TBI of 0.5 with monophasic waveforms on the right. On the left his ABI was 1.20 with a TBI of 0.92 and triphasic waveforms. He has not had arterial studies since.  and/or when sitting for 3-4 times a day throughout the day. Avoid standing for long periods of time. Patient to  wear own compression stockings every day. Moisturize legs daily. Off-Loading: T Contact Cast to Right Lower Extremity otal Removable cast walker boot to: - right leg The following medication(s) was prescribed: lidocaine topical 4 % cream cream topical was prescribed at facility WOUND #2: - Foot Wound Laterality: Plantar, Right Cleanser: Soap and Water Every Other Day/30 Days Discharge Instructions: May shower and wash wound with dial antibacterial soap and water prior to dressing change. Cleanser: Wound Cleanser Every Other Day/30 Days Discharge Instructions: Cleanse the wound with wound cleanser prior to applying a clean dressing using gauze sponges, not tissue or cotton balls. Peri-Wound Care: Zinc Oxide Ointment 30g tube Every Other Day/30 Days Discharge Instructions: Apply Zinc Oxide to periwound with each dressing change Topical: Gentamicin Every Other Day/30 Days Discharge Instructions: As directed by physician Topical: Mupirocin Ointment Every Other Day/30 Days Discharge Instructions: Apply Mupirocin (Bactroban) as instructed Prim Dressing: Maxorb Extra Ag+ Alginate Dressing, 2x2 (in/in) Every Other Day/30 Days ary Discharge Instructions: Apply to wound bed as instructed Secondary Dressing: Woven Gauze Sponge, Non-Sterile 4x4 in Every Other Day/30 Days Discharge Instructions: Apply over primary dressing as directed. Secondary Dressing: Zetuvit Plus 4x8 in Every Other Day/30 Days Discharge Instructions: Apply over primary dressing as directed. Secured With: [employer], 4.5x3.1 (in/yd) Every Other Day/30 Days Discharge Instructions: Secure with Kerlix as directed. Secured With: 29M Medipore H Soft Cloth Surgical T ape, 4 x 10 (in/yd) Every Other Day/30 Days Discharge Instructions: Secure with tape as directed. 04/06/2023: There has been fairly substantial breakdown of the wound. This appears mostly secondary to moisture. It is larger and involves the medial aspect of the  first metatarsal head again. As he has not had assistance with changing his dressing and the wound has broken down considerably, I am going to put him back in a total contact cast. The wound bed was prepared for total contact casting. T opical gentamicin and mupirocin were applied followed by silver alginate and a substantial absorbent layer. The total contact cast was then applied without difficulty. Follow-up in 1 week. Electronic Signature(s) Signed: 04/06/2023 10:45:00 AM By: Duanne Guess MD FACS Entered By: Duanne Guess on 04/06/2023 07:44:59 [place], Kerim A ([id number]) [id number] Page 9 of 11 -------------------------------------------------------------------------------- HxROS Details Patient Name: Date of Service: [place], [name] 04/06/2023 9:15 A M Medical Record Number: [id number] Patient Account Number: [id number] Date of Birth/Sex: Treating RN: [birth year] (67 y.o. M) Primary Care Provider: Arva Chafe Other Clinician: Referring Provider: Treating Provider/Extender: [name] in Treatment: 31 Information Obtained From Patient Respiratory Medical History: Positive for: Sleep Apnea Cardiovascular Medical History: Positive for: Arrhythmia - A-Fib; Coronary Artery Disease - s/p CABG; Deep Vein Thrombosis; Hypertension; Myocardial Infarction; Peripheral Arterial Disease - s/p Fempop x 2 Past Medical History Notes: CVA x 3 Endocrine Medical History: Positive for: Type II Diabetes Time with diabetes: since 1997 Treated with: Insulin, Oral agents Blood sugar tested every day: Yes Tested : 2-3x per day Musculoskeletal Medical History: Past Medical History Notes: carpal tunnel syndrome Neurologic Medical History: Positive for: Neuropathy Past Medical History Notes: stroke Oncologic Medical History: Past Medical History Notes: skin cancer Immunizations Pneumococcal Vaccine: Received Pneumococcal  Vaccination: Yes Received Pneumococcal Vaccination On or After 60th Birthday: Yes Implantable Devices None Hospitalization / Surgery History Type of Hospitalization/Surgery colonoscopy polypectomy peripheral vascular cath shoulder arthroscopy carpal tunnel release coronary artery bypass [place], [place]  Date of Service: [place], [name] 04/06/2023 9:15 A M Medical Record Number: [id number] Patient Account Number: [id number] Date of Birth/Sex: Treating RN: October 12, 1955 (67 y.o. M) Primary Care Provider: Arva Chafe Other Clinician: Referring Provider: Treating Provider/Extender: [name] in Treatment: 31 Active Problems Hoskin, Decklyn A ([id number]) [id number] Page 5 of 11 ICD-10 Encounter Code Description Active Date MDM Diagnosis L97.512 Non-pressure chronic ulcer of other part of right foot with fat layer exposed 08/31/2022 No Yes I73.9 Peripheral vascular disease, unspecified 08/31/2022 No Yes I25.10 Atherosclerotic heart disease of native coronary artery without angina pectoris 08/31/2022 No Yes I10 Essential (primary) hypertension 08/31/2022 No Yes E11.65 Type 2 diabetes mellitus with hyperglycemia 08/31/2022 No Yes E11.40 Type 2 diabetes mellitus with diabetic neuropathy, unspecified 08/31/2022 No Yes I63.9 Cerebral infarction, unspecified 08/31/2022 No Yes Inactive Problems Resolved Problems ICD-10 Code Description Active Date Resolved Date L97.821 Non-pressure chronic ulcer of other part of left lower leg limited to breakdown of skin 11/05/2022 11/05/2022 Electronic Signature(s) Signed: 04/06/2023 10:34:24 AM By: Duanne Guess MD FACS Entered By: Duanne Guess on 04/06/2023 07:34:24 -------------------------------------------------------------------------------- Progress Note Details Patient Name: Date of Service: [name] Pian, [name] 04/06/2023 9:15 A M Medical Record Number: [id number] Patient Account Number: [id number] Date of Birth/Sex: Treating RN: 1955/09/02 (67 y.o. M) Primary Care Provider: Arva Chafe Other Clinician: Referring Provider: Treating Provider/Extender: [name] in Treatment: 31 Subjective Chief Complaint Information obtained from Patient 05/14/2020; patient is here for review of an abrasion injury on the left lateral calf 08/31/2022: [name] (1st metatarsal base, plantar) History of Present Illness (HPI) ADMISSION 05/14/2020; this is a 67 year old man with multiple medical issues. Predominantly he has type 2 diabetes with a history of peripheral neuropathy and also history of fairly significant PAD. He had a left superficial femoral to posterior tibial artery bypass in February 2017 he also had an atherectomy and angioplasty by Dr. [name] of the right popliteal artery in 2016. He is supposed to be getting arterial studies annually however this was interrupted last year because of the pandemic. RONI, STARR A ([id number]) [id number] Page 6 of 11 He tells [place] he was at [facility] 2 weeks ago was getting out of of the scooter and traumatized his left lateral lower leg. There was a lot of bleeding as the patient is on Plavix and Eliquis. They have been dressing this with Neosporin and doing a fairly good job. Wound measures 2.5 x 3.5 it does not have any depth he does not have a wound history in his legs outside of surgery however he does have chronic edema and skin changes suggestive of chronic venous disease possibly some degree of lymphedema as well. Past medical history includes type 2 diabetes with peripheral neuropathy and gait instability, lumbar spondylosis, obstructive sleep apnea, history of a left pontine CVA, basal cell skin cancer, atrial fibrillation on anticoagulation, significant PAD as noted with a left superficial femoral to posterior tibial bypass in February 2017 and a right popliteal atherectomy and angioplasty by Dr. [name] in 30th 2016. He also has a history of coronary artery disease with an MI in 2002 hypertension hyperlipidemia and heart failure with preserved ejection  fraction His last arterial studies I can see in epic were on 03/10/2018 this showed a right ABI of 0.69 and a right TBI of 0.5 with monophasic waveforms on the right. On the left his ABI was 1.20 with a TBI of 0.92 and triphasic waveforms. He has not had arterial studies since.

## 2023-04-06 NOTE — Progress Notes (Signed)
[name], [name] ([id number]) [id number] Page 1 of 8 Visit Report for 04/06/2023 Arrival Information Details Patient Name: Date of Service: MANNER, [name] [name]. 04/06/2023 9:15 [name] Medical Record Number: [id number] Patient Account Number: [id number] Date of Birth/Sex: Treating RN: October 28, 1955 (67 y.o. [name], [name] Primary Care Josede Cicero: Arva Chafe Other Clinician: Referring Corleen Otwell: Treating Ellijah Leffel/Extender: [name] in Treatment: 31 Visit Information History Since Last Visit Added or deleted any medications: No Patient Arrived: Ambulatory Any new allergies or adverse reactions: No Arrival Time: 08:46 Had [name] fall or experienced change in No Accompanied By: self activities of daily living that may affect Transfer Assistance: None risk of falls: Patient Identification Verified: Yes Signs or symptoms of abuse/neglect since last visito No Secondary Verification Process Completed: Yes Hospitalized since last visit: No Patient Requires Transmission-Based Precautions: No Implantable device outside of the clinic excluding No Patient Has Alerts: No cellular tissue based products placed in the center since last visit: Has Dressing in Place as Prescribed: Yes Pain Present Now: Yes Electronic Signature(s) Signed: 04/06/2023 3:33:31 PM By: Samuella Bruin Entered By: Samuella Bruin on 04/06/2023 06:05:12 -------------------------------------------------------------------------------- Encounter Discharge Information Details Patient Name: Date of Service: [name] Pian, [name]. 04/06/2023 9:15 [name] Medical Record Number: [id number] Patient Account Number: [id number] Date of Birth/Sex: Treating RN: Mar 25, 1956 (67 y.o. [name] Primary Care Prince Couey: Arva Chafe Other Clinician: Referring Kenry Daubert: Treating Lilith Solana/Extender: [name] in Treatment: 31 Encounter Discharge  Information Items Discharge Condition: Stable Ambulatory Status: Ambulatory Discharge Destination: Home Transportation: Private Auto Accompanied By: self Schedule Follow-up Appointment: Yes Clinical Summary of Care: Patient Declined Electronic Signature(s) Signed: 04/06/2023 3:33:31 PM By: Samuella Bruin Entered By: Samuella Bruin on 04/06/2023 06:59:35 Hobson, Yvette [name] ([id number]) [id number] Page 2 of 8 -------------------------------------------------------------------------------- Lower Extremity Assessment Details Patient Name: Date of Service: [name], [name] [name]. 04/06/2023 9:15 [name] Medical Record Number: [id number] Patient Account Number: [id number] Date of Birth/Sex: Treating RN: 1956/04/03 (67 y.o. [name] Primary Care Antoniette Peake: Arva Chafe Other Clinician: Referring Elisha Cooksey: Treating Takumi Din/Extender: [name] in Treatment: 31 Edema Assessment Assessed: [name]: No] [name]: No] Edema: [Left: Ye] [Right: s] Calf Left: Right: Point of Measurement: From Medial Instep 38 cm Ankle Left: Right: Point of Measurement: From Medial Instep 24 cm Vascular Assessment Pulses: Dorsalis Pedis Palpable: [Right:Yes] Extremity colors, hair growth, and conditions: Extremity Color: [Right:Normal] Hair Growth on Extremity: [Right:Yes] Temperature of Extremity: [Right:Warm] Capillary Refill: [Right:< 3 seconds] Dependent Rubor: [Right:No No] Electronic Signature(s) Signed: 04/06/2023 3:33:31 PM By: Samuella Bruin Entered By: Samuella Bruin on 04/06/2023 06:11:15 -------------------------------------------------------------------------------- Multi Wound Chart Details Patient Name: Date of Service: [name] Pian, [name]. 04/06/2023 9:15 [name] Medical Record Number: [id number] Patient Account Number: [id number] Date of Birth/Sex: Treating RN: 09/14/1955 (67 y.o. 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 04/06/2023 9:15 [name] Medical Record Number: [id number] Patient Account Number: [id number] Date of Birth/Sex: Treating RN: October 28, 1955 (67 y.o. [name], [name] Primary Care Josede Cicero: Arva Chafe Other Clinician: Referring Corleen Otwell: Treating Ellijah Leffel/Extender: [name] in Treatment: 31 Visit Information History Since Last Visit Added or deleted any medications: No Patient Arrived: Ambulatory Any new allergies or adverse reactions: No Arrival Time: 08:46 Had [name] fall or experienced change in No Accompanied By: self activities of daily living that may affect Transfer Assistance: None risk of falls: Patient Identification Verified: Yes Signs or symptoms of abuse/neglect since last visito No Secondary Verification Process Completed: Yes Hospitalized since last visit: No Patient Requires Transmission-Based Precautions: No Implantable device outside of the clinic excluding No Patient Has Alerts: No cellular tissue based products placed in the center since last visit: Has Dressing in Place as Prescribed: Yes Pain Present Now: Yes Electronic Signature(s) Signed: 04/06/2023 3:33:31 PM By: Samuella Bruin Entered By: Samuella Bruin on 04/06/2023 06:05:12 -------------------------------------------------------------------------------- Encounter Discharge Information Details Patient Name: Date of Service: [name] Pian, [name]. 04/06/2023 9:15 [name] Medical Record Number: [id number] Patient Account Number: [id number] Date of Birth/Sex: Treating RN: Mar 25, 1956 (67 y.o. [name] Primary Care Prince Couey: Arva Chafe Other Clinician: Referring Kenry Daubert: Treating Lilith Solana/Extender: [name] in Treatment: 31 Encounter Discharge  Information Items Discharge Condition: Stable Ambulatory Status: Ambulatory Discharge Destination: Home Transportation: Private Auto Accompanied By: self Schedule Follow-up Appointment: Yes Clinical Summary of Care: Patient Declined Electronic Signature(s) Signed: 04/06/2023 3:33:31 PM By: Samuella Bruin Entered By: Samuella Bruin on 04/06/2023 06:59:35 Hobson, Yvette [name] ([id number]) [id number] Page 2 of 8 -------------------------------------------------------------------------------- Lower Extremity Assessment Details Patient Name: Date of Service: [name], [name] [name]. 04/06/2023 9:15 [name] Medical Record Number: [id number] Patient Account Number: [id number] Date of Birth/Sex: Treating RN: 1956/04/03 (67 y.o. [name] Primary Care Antoniette Peake: Arva Chafe Other Clinician: Referring Elisha Cooksey: Treating Takumi Din/Extender: [name] in Treatment: 31 Edema Assessment Assessed: [name]: No] [name]: No] Edema: [Left: Ye] [Right: s] Calf Left: Right: Point of Measurement: From Medial Instep 38 cm Ankle Left: Right: Point of Measurement: From Medial Instep 24 cm Vascular Assessment Pulses: Dorsalis Pedis Palpable: [Right:Yes] Extremity colors, hair growth, and conditions: Extremity Color: [Right:Normal] Hair Growth on Extremity: [Right:Yes] Temperature of Extremity: [Right:Warm] Capillary Refill: [Right:< 3 seconds] Dependent Rubor: [Right:No No] Electronic Signature(s) Signed: 04/06/2023 3:33:31 PM By: Samuella Bruin Entered By: Samuella Bruin on 04/06/2023 06:11:15 -------------------------------------------------------------------------------- Multi Wound Chart Details Patient Name: Date of Service: [name] Pian, [name]. 04/06/2023 9:15 [name] Medical Record Number: [id number] Patient Account Number: [id number] Date of Birth/Sex: Treating RN: 09/14/1955 (67 y.o. 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 Sleep Apnea, Arrhythmia, Coronary [name] [name] Comorbid History: Artery Disease, Deep Vein Thrombosis, Hypertension, Myocardial Infarction, Peripheral Arterial Disease, Type II Diabetes, Neuropathy 07/08/2022 [name] [name] Date Acquired: 31 [name] [name] Weeks of Treatment: Open [name] [name] Wound Status: No [name] [name] Wound Recurrence: 0.5x0.7x0.2 [name] [name] Measurements L x W  x D (cm) 0.275 [name] [name] [name] : rea 0.055 [name] [name] Volume (cm) : 89.20% [name] [name] % Reduction in [name] rea: 78.40% [name] [name] % Reduction in Volume: 12 Starting Position 1 (o'clock): 12 Ending Position 1 (o'clock): 1.5 Maximum Distance 1 (cm): Yes [name] [name] Undermining: Grade 1 [name] [name] Classification: Medium [name] [name] Exudate [name] mount: Serosanguineous [name] [name] Exudate Type: red, brown [name] [name] Exudate Color: Distinct, outline attached [name] [name] Wound Margin: Medium (34-66%) [name] [name] Granulation [name] mount: Red [name] [name] Granulation Quality: Medium (34-66%) [name] [name] Necrotic [name] mount: Eschar, Adherent Slough [name] [name] Necrotic Tissue: Fat Layer (Subcutaneous Tissue): Yes [name] [name] Exposed Structures: Fascia: No Tendon: No Muscle: No Joint: No Bone: No Small (1-33%) [name] [name] Epithelialization: Callus: Yes [name] [name] Periwound Skin Texture: Maceration: Yes [name] [name] Periwound Skin Moisture: Dry/Scaly: No No Abnormalities Noted [name] [name] Periwound Skin Color: No Abnormality [name] [name] Temperature: T Contact Cast otal [name]  [name] Procedures Performed: Treatment Notes Wound #2 (Foot) Wound Laterality: Plantar, Right Cleanser Soap and Water Discharge Instruction: May shower and wash wound with dial antibacterial soap and water prior to dressing change. Wound Cleanser Discharge Instruction: Cleanse the wound with wound cleanser prior to applying [name] clean dressing using gauze sponges, not tissue or cotton balls. Peri-Wound Care Zinc Oxide Ointment 30g tube Discharge Instruction: Apply Zinc Oxide to periwound with each dressing change Topical Gentamicin Discharge Instruction: As directed by physician Mupirocin Ointment Discharge Instruction: Apply Mupirocin (Bactroban) as instructed Primary Dressing Maxorb Extra Ag+ Alginate Dressing, 2x2 (in/in) Discharge Instruction: Apply to wound bed as instructed Secondary Dressing Woven Gauze Sponge, Non-Sterile 4x4 in Discharge Instruction: Apply over primary dressing as directed. SEAB, RHAME [name] ([id number]) [id number] Page 4 of 8 Zetuvit Plus 4x8 in Discharge Instruction: Apply over primary dressing as directed. Secured With [employer], 4.5x3.1 (in/yd) Discharge Instruction: Secure with Kerlix as directed. 69M Medipore H Soft Cloth Surgical T ape, 4 x 10 (in/yd) Discharge Instruction: Secure with tape as directed. Compression Wrap Compression Stockings Add-Ons Electronic Signature(s) Signed: 04/06/2023 10:39:43 AM By: Duanne Guess MD FACS Entered By: Duanne Guess on 04/06/2023 07:39:42 -------------------------------------------------------------------------------- Multi-Disciplinary Care Plan Details Patient Name: Date of Service: [name] Pian, [name]. 04/06/2023 9:15 [name] Medical Record Number: [id number] Patient Account Number: [id number] Date of Birth/Sex: Treating RN: [birth year] (67 y.o. [name] Primary Care Julionna Marczak: Arva Chafe Other Clinician: Referring Ahmaad Neidhardt: Treating Troye Hiemstra/Extender: [name] in Treatment: 31 Multidisciplinary Care Plan reviewed with physician Active Inactive Abuse / Safety / Falls / Self Care Management Nursing Diagnoses: Impaired physical mobility Potential for falls Goals: Patient will not experience any injury related to falls Date Initiated: 08/31/2022 Target Resolution Date: 04/21/2023 Goal Status: Active Patient/caregiver will verbalize/demonstrate measures taken to improve the patient's personal safety Date Initiated: 08/31/2022 Target Resolution Date: 04/21/2023 Goal Status: Active Interventions: Provide education on basic hygiene Provide education on fall prevention Notes: Wound/Skin Impairment Nursing Diagnoses: Impaired tissue integrity Knowledge deficit related to ulceration/compromised skin integrity Goals: Patient/caregiver will verbalize understanding of skin care regimen Date Initiated: 08/31/2022 Target Resolution Date: 04/20/2023 Goal Status: Active Interventions: Assess patient/caregiver ability to obtain necessary supplies Yerkes, Obi [name] ([id number]) [id number] Page 5 of 8 Assess patient/caregiver ability to perform ulcer/skin care regimen upon admission and as needed Assess ulceration(s) every visit Treatment Activities: Skin care regimen initiated : 08/31/2022 Topical wound management initiated : 08/31/2022 Notes: Electronic Signature(s) Signed: 04/06/2023 3:33:31 PM By: Samuella Bruin Entered By: Samuella Bruin on 04/06/2023 06:24:20 --------------------------------------------------------------------------------  Non-Sterile 4x4 in Discharge Instruction: Apply over primary dressing as directed. Zetuvit Plus 4x8 in Discharge Instruction: Apply over primary dressing as directed. Secured With [employer], 4.5x3.1 (in/yd) Discharge Instruction: Secure with Kerlix as directed. 28M Medipore H Soft Cloth Surgical T ape, 4 x 10 (in/yd) Discharge Instruction: Secure with tape as directed. Compression Wrap Compression Stockings Add-Ons Electronic Signature(s) Signed: 04/06/2023 3:33:31 PM By: Samuella Bruin Entered By: Samuella Bruin on 04/06/2023 06:20:41 Vanhorn, Hayes [name] ([id number]) [id number] Page 8 of 8 -------------------------------------------------------------------------------- Vitals Details Patient Name: Date of Service: [name], [name] [name]. 04/06/2023 9:15 [name] Medical Record Number: [id number] Patient Account Number: [id number] Date of Birth/Sex: Treating RN: 09/28/1955 (67 y.o. [name] Primary Care Lyndel Sarate: Arva Chafe Other Clinician: Referring Keneisha Heckart: Treating Kishan Wachsmuth/Extender: [name] in Treatment: 31 Vital Signs Time Taken: 09:06 Temperature (F): 97.8 Height (in): 70 Pulse (bpm): 67 Weight (lbs): 260 Respiratory Rate (breaths/min): 18 Body Mass Index (BMI): 37.3 Blood Pressure (mmHg): 177/73 Capillary Blood Glucose (mg/dl): 329 Reference Range: 80 - 120 mg / dl Electronic Signature(s) Signed: 04/06/2023 3:33:31 PM By:  Samuella Bruin Entered By: Samuella Bruin on 04/06/2023 06:06:49

## 2023-04-08 DIAGNOSIS — E1165 Type 2 diabetes mellitus with hyperglycemia: Secondary | ICD-10-CM | POA: Diagnosis not present

## 2023-04-08 DIAGNOSIS — Z23 Encounter for immunization: Secondary | ICD-10-CM | POA: Diagnosis not present

## 2023-04-13 ENCOUNTER — Encounter (HOSPITAL_BASED_OUTPATIENT_CLINIC_OR_DEPARTMENT_OTHER): Payer: Medicare HMO | Admitting: General Surgery

## 2023-04-13 DIAGNOSIS — L97512 Non-pressure chronic ulcer of other part of right foot with fat layer exposed: Secondary | ICD-10-CM | POA: Diagnosis not present

## 2023-04-13 DIAGNOSIS — E11621 Type 2 diabetes mellitus with foot ulcer: Secondary | ICD-10-CM | POA: Diagnosis not present

## 2023-04-13 DIAGNOSIS — E11622 Type 2 diabetes mellitus with other skin ulcer: Secondary | ICD-10-CM | POA: Diagnosis not present

## 2023-04-13 NOTE — Progress Notes (Signed)
DOSE topical 4 %  cream - cream topical Electronic Signature(s) Signed: 04/13/2023 10:42:46 AM By: Duanne Guess MD FACS Entered By: Duanne Guess on 04/13/2023 09:42:36 -------------------------------------------------------------------------------- Problem List Details Patient Name: Date of Service: [name] Pian, [name]. 04/13/2023 9:15 [name] Medical Record Number: [id number] Patient Account Number: [id number] Date of Birth/Sex: Treating RN: 1956/07/16 (67 y.o. M) Primary Care Provider: Arva Chafe Other Clinician: Referring Provider: Treating Provider/Extender: [name] in Treatment: [address], [name] ([id number]) [id number] Page 5 of 11 Active Problems ICD-10 Encounter Code Description Active Date MDM Diagnosis L97.512 Non-pressure chronic ulcer of other part of right foot with fat layer exposed 08/31/2022 No Yes I73.9 Peripheral vascular disease, unspecified 08/31/2022 No Yes I25.10 Atherosclerotic heart disease of native coronary artery without angina pectoris 08/31/2022 No Yes I10 Essential (primary) hypertension 08/31/2022 No Yes E11.65 Type 2 diabetes mellitus with hyperglycemia 08/31/2022 No Yes E11.40 Type 2 diabetes mellitus with diabetic neuropathy, unspecified 08/31/2022 No Yes I63.9 Cerebral infarction, unspecified 08/31/2022 No Yes Inactive Problems Resolved Problems ICD-10 Code Description Active Date Resolved Date L97.821 Non-pressure chronic ulcer of other part of left lower leg limited to breakdown of skin 11/05/2022 11/05/2022 Electronic Signature(s) Signed: 04/13/2023 9:40:46 AM By: Duanne Guess MD FACS Entered By: Duanne Guess on 04/13/2023 09:40:46 -------------------------------------------------------------------------------- Progress Note Details Patient Name: Date of Service: [name] Pian, [name]. 04/13/2023 9:15 [name] Medical Record Number: [id number] Patient Account Number: [id number] Date of Birth/Sex: Treating  RN: 07-Jan-1956 (67 y.o. M) Primary Care Provider: Arva Chafe Other Clinician: Referring Provider: Treating Provider/Extender: [name] in Treatment: 32 Subjective Chief Complaint Information obtained from Patient 05/14/2020; patient is here for review of an abrasion injury on the left lateral calf 08/31/2022: [name] (1st metatarsal base, plantar) History of Present Illness (HPI) ADMISSION 05/14/2020; this is [name] with multiple medical issues. Predominantly he has type 2 diabetes with [name] and also [name], [name] ([id number]) [id number] Page 6 of 11 history of fairly significant PAD. He had [name] in February 2017 he also had an atherectomy and angioplasty by Dr. [name] of the right popliteal artery in 2016. He is supposed to be getting arterial studies annually however this was interrupted last year because of the pandemic. He tells [name] he was at [facility] 2 weeks ago was getting out of of the scooter and traumatized his left lateral lower leg. There was [name] as the patient is on Plavix and Eliquis. They have been dressing this with Neosporin and doing [name] fairly good job. Wound measures 2.5 x 3.5 it does not have any depth he does not have [name] in his legs outside of surgery however he does have chronic edema and skin changes suggestive of chronic venous disease possibly some degree of lymphedema as well. Past medical history includes type 2 diabetes with peripheral neuropathy and gait instability, lumbar spondylosis, obstructive sleep apnea, history of [name], basal cell skin cancer, atrial fibrillation on anticoagulation, significant PAD as noted with [name] and [name] in 30th  2016. He also has [name] of coronary artery disease with an MI in 2002 hypertension hyperlipidemia and heart failure with preserved ejection fraction His last arterial studies I can see in epic were on 03/10/2018 this showed [name] and  DOSE topical 4 %  cream - cream topical Electronic Signature(s) Signed: 04/13/2023 10:42:46 AM By: Duanne Guess MD FACS Entered By: Duanne Guess on 04/13/2023 09:42:36 -------------------------------------------------------------------------------- Problem List Details Patient Name: Date of Service: [name] Pian, [name]. 04/13/2023 9:15 [name] Medical Record Number: [id number] Patient Account Number: [id number] Date of Birth/Sex: Treating RN: 1956/07/16 (67 y.o. M) Primary Care Provider: Arva Chafe Other Clinician: Referring Provider: Treating Provider/Extender: [name] in Treatment: [address], [name] ([id number]) [id number] Page 5 of 11 Active Problems ICD-10 Encounter Code Description Active Date MDM Diagnosis L97.512 Non-pressure chronic ulcer of other part of right foot with fat layer exposed 08/31/2022 No Yes I73.9 Peripheral vascular disease, unspecified 08/31/2022 No Yes I25.10 Atherosclerotic heart disease of native coronary artery without angina pectoris 08/31/2022 No Yes I10 Essential (primary) hypertension 08/31/2022 No Yes E11.65 Type 2 diabetes mellitus with hyperglycemia 08/31/2022 No Yes E11.40 Type 2 diabetes mellitus with diabetic neuropathy, unspecified 08/31/2022 No Yes I63.9 Cerebral infarction, unspecified 08/31/2022 No Yes Inactive Problems Resolved Problems ICD-10 Code Description Active Date Resolved Date L97.821 Non-pressure chronic ulcer of other part of left lower leg limited to breakdown of skin 11/05/2022 11/05/2022 Electronic Signature(s) Signed: 04/13/2023 9:40:46 AM By: Duanne Guess MD FACS Entered By: Duanne Guess on 04/13/2023 09:40:46 -------------------------------------------------------------------------------- Progress Note Details Patient Name: Date of Service: [name] Pian, [name]. 04/13/2023 9:15 [name] Medical Record Number: [id number] Patient Account Number: [id number] Date of Birth/Sex: Treating  RN: 07-Jan-1956 (67 y.o. M) Primary Care Provider: Arva Chafe Other Clinician: Referring Provider: Treating Provider/Extender: [name] in Treatment: 32 Subjective Chief Complaint Information obtained from Patient 05/14/2020; patient is here for review of an abrasion injury on the left lateral calf 08/31/2022: [name] (1st metatarsal base, plantar) History of Present Illness (HPI) ADMISSION 05/14/2020; this is [name] with multiple medical issues. Predominantly he has type 2 diabetes with [name] and also [name], [name] ([id number]) [id number] Page 6 of 11 history of fairly significant PAD. He had [name] in February 2017 he also had an atherectomy and angioplasty by Dr. [name] of the right popliteal artery in 2016. He is supposed to be getting arterial studies annually however this was interrupted last year because of the pandemic. He tells [name] he was at [facility] 2 weeks ago was getting out of of the scooter and traumatized his left lateral lower leg. There was [name] as the patient is on Plavix and Eliquis. They have been dressing this with Neosporin and doing [name] fairly good job. Wound measures 2.5 x 3.5 it does not have any depth he does not have [name] in his legs outside of surgery however he does have chronic edema and skin changes suggestive of chronic venous disease possibly some degree of lymphedema as well. Past medical history includes type 2 diabetes with peripheral neuropathy and gait instability, lumbar spondylosis, obstructive sleep apnea, history of [name], basal cell skin cancer, atrial fibrillation on anticoagulation, significant PAD as noted with [name] and [name] in 30th  2016. He also has [name] of coronary artery disease with an MI in 2002 hypertension hyperlipidemia and heart failure with preserved ejection fraction His last arterial studies I can see in epic were on 03/10/2018 this showed [name] and  Tabbert, Tyrece [name] ([id number]) [id number] Page 1 of 11 Visit Report for 04/13/2023 Chief Complaint Document Details Patient Name: Date of Service: [name], [name]. 04/13/2023 9:15 [name] Medical Record Number: [id number] Patient Account Number: [id number] Date of Birth/Sex: Treating RN: Nov 19, 1955 (67 y.o. M) Primary Care Provider: Arva Chafe Other Clinician: Referring Provider: Treating Provider/Extender: [name] in Treatment: 32 Information Obtained from: Patient Chief Complaint 05/14/2020; patient is here for review of an abrasion injury on the left lateral calf 08/31/2022: [name] (1st metatarsal base, plantar) Electronic Signature(s) Signed: 04/13/2023 9:41:01 AM By: Duanne Guess MD FACS Entered By: Duanne Guess on 04/13/2023 09:41:01 -------------------------------------------------------------------------------- HPI Details Patient Name: Date of Service: [name] Pian, [name]. 04/13/2023 9:15 [name] Medical Record Number: [id number] Patient Account Number: [id number] Date of Birth/Sex: Treating RN: 1955/07/31 (67 y.o. M) Primary Care Provider: Arva Chafe Other Clinician: Referring Provider: Treating Provider/Extender: [name] in Treatment: 32 History of Present Illness HPI Description: ADMISSION 05/14/2020; this is [name] with multiple medical issues. Predominantly he has type 2 diabetes with [name] and also history of fairly significant PAD. He had [name] in February 2017 he also had an atherectomy and angioplasty by Dr. [name] of the right popliteal artery in 2016. He is supposed to be getting arterial studies annually however this was interrupted last year because of the pandemic. He tells [name] he was at [facility] 2 weeks ago was getting out of of the scooter and traumatized his left  lateral lower leg. There was [name] as the patient is on Plavix and Eliquis. They have been dressing this with Neosporin and doing [name] fairly good job. Wound measures 2.5 x 3.5 it does not have any depth he does not have [name] in his legs outside of surgery however he does have chronic edema and skin changes suggestive of chronic venous disease possibly some degree of lymphedema as well. Past medical history includes type 2 diabetes with peripheral neuropathy and gait instability, lumbar spondylosis, obstructive sleep apnea, history of [name], basal cell skin cancer, atrial fibrillation on anticoagulation, significant PAD as noted with [name] and [name] in 30th 2016. He also has [name] of coronary artery disease with an MI in 2002 hypertension hyperlipidemia and heart failure with preserved ejection fraction His last arterial studies I can see in epic were on 03/10/2018 this showed [name] and [name] with monophasic waveforms on the right. On the left his ABI was 1.20 with [name] and triphasic waveforms. He has not had arterial studies since. Our nurse in the clinic got an ABI on the left of 1.1 11/2; left anterior leg wound in the setting of chronic venous insufficiency. Wound was initially trauma. We have been using Hydrofera Blue under compression he has home health.. The wound looks [name] lot better today with improvement in surface area 11/16; left anterior leg wound in the setting of chronic venous insufficiency. Wound was initially trauma. We have been using Hydrofera Blue under compression. The patient is closed today. He is supposed to follow-up with vein and vascular with regards to arterial insufficiency nevertheless his leg wounds are 12/3; apparently 2 weeks ago when they were putting on their stockings they managed  example, large plastic bag) or [name] cast cover and may then take shower. Edema Control - Lymphedema / SCD / Other: Elevate legs to  the level of the heart or above for 30 minutes daily and/or when sitting for 3-4 times [name] day throughout the day. Avoid standing for long periods of time. Patient to wear own compression stockings every day. Moisturize legs daily. Off-Loading: T Contact Cast to Right Lower Extremity otal Removable cast walker boot to: - right leg The following medication(s) was prescribed: lidocaine topical 4 % cream cream topical was prescribed at facility WOUND #2: - Foot Wound Laterality: Plantar, Right Cleanser: Soap and Water Every Other Day/30 Days Discharge Instructions: May shower and wash wound with dial antibacterial soap and water prior to dressing change. Cleanser: Wound Cleanser Every Other Day/30 Days Discharge Instructions: Cleanse the wound with wound cleanser prior to applying [name] clean dressing using gauze sponges, not tissue or cotton balls. Peri-Wound Care: Zinc Oxide Ointment 30g tube Every Other Day/30 Days Discharge Instructions: Apply Zinc Oxide to periwound with each dressing change Topical: Gentamicin Every Other Day/30 Days Discharge Instructions: As directed by physician Topical: Mupirocin Ointment Every Other Day/30 Days Discharge Instructions: Apply Mupirocin (Bactroban) as instructed Prim Dressing: Maxorb Extra Ag+ Alginate Dressing, 2x2 (in/in) Every Other Day/30 Days ary Discharge Instructions: Apply to wound bed as instructed Secondary Dressing: Woven Gauze Sponge, Non-Sterile 4x4 in Every Other Day/30 Days Discharge Instructions: Apply over primary dressing as directed. Secondary Dressing: Zetuvit Plus 4x8 in Every Other Day/30 Days Discharge Instructions: Apply over primary dressing as directed. Secured With: [employer], 4.5x3.1 (in/yd) Every Other Day/30 Days Discharge Instructions: Secure with Kerlix as directed. Secured With: 81M Medipore H Soft Cloth Surgical T ape, 4 x 10 (in/yd) Every Other Day/30 Days Discharge Instructions: Secure with tape as  directed. 04/13/2023: The wound looks markedly better this week after being in the total contact cast. There is no moisture-related tissue breakdown, and the wound opening is smaller. The wound was prepared for total contact casting. T opical gentamicin and mupirocin were applied to the wound along with silver alginate and zinc oxide was applied to the periwound. [name] total contact cast was then applied in standard fashion. Follow-up in 1 week. Mccreedy, Dvon [name] ([id number]) [id number] Page 9 of 11 Electronic Signature(s) Signed: 04/13/2023 9:43:22 AM By: Duanne Guess MD FACS Entered By: Duanne Guess on 04/13/2023 09:43:22 -------------------------------------------------------------------------------- HxROS Details Patient Name: Date of Service: [name] Pian, [name]. 04/13/2023 9:15 [name] Medical Record Number: [id number] Patient Account Number: [id number] Date of Birth/Sex: Treating RN: [birth year] (67 y.o. M) Primary Care Provider: Arva Chafe Other Clinician: Referring Provider: Treating Provider/Extender: [name] in Treatment: 32 Information Obtained From Patient Respiratory Medical History: Positive for: Sleep Apnea Cardiovascular Medical History: Positive for: Arrhythmia - [name]-Fib; Coronary Artery Disease - s/p CABG; Deep Vein Thrombosis; Hypertension; Myocardial Infarction; Peripheral Arterial Disease - s/p Fempop x 2 Past Medical History Notes: CVA x 3 Endocrine Medical History: Positive for: Type II Diabetes Time with diabetes: since 1997 Treated with: Insulin, Oral agents Blood sugar tested every day: Yes Tested : 2-3x per day Musculoskeletal Medical History: Past Medical History Notes: carpal tunnel syndrome Neurologic Medical History: Positive for: Neuropathy Past Medical History Notes: stroke Oncologic Medical History: Past Medical History Notes: skin cancer Immunizations Pneumococcal  Vaccine: Received Pneumococcal Vaccination: Yes Received Pneumococcal Vaccination On or After 60th Birthday: Yes Implantable Devices None Hospitalization / Surgery History Type of Hospitalization/Surgery colonoscopy polypectomy Lindell, Kimsey [name] ([id number])  example, large plastic bag) or [name] cast cover and may then take shower. Edema Control - Lymphedema / SCD / Other: Elevate legs to  the level of the heart or above for 30 minutes daily and/or when sitting for 3-4 times [name] day throughout the day. Avoid standing for long periods of time. Patient to wear own compression stockings every day. Moisturize legs daily. Off-Loading: T Contact Cast to Right Lower Extremity otal Removable cast walker boot to: - right leg The following medication(s) was prescribed: lidocaine topical 4 % cream cream topical was prescribed at facility WOUND #2: - Foot Wound Laterality: Plantar, Right Cleanser: Soap and Water Every Other Day/30 Days Discharge Instructions: May shower and wash wound with dial antibacterial soap and water prior to dressing change. Cleanser: Wound Cleanser Every Other Day/30 Days Discharge Instructions: Cleanse the wound with wound cleanser prior to applying [name] clean dressing using gauze sponges, not tissue or cotton balls. Peri-Wound Care: Zinc Oxide Ointment 30g tube Every Other Day/30 Days Discharge Instructions: Apply Zinc Oxide to periwound with each dressing change Topical: Gentamicin Every Other Day/30 Days Discharge Instructions: As directed by physician Topical: Mupirocin Ointment Every Other Day/30 Days Discharge Instructions: Apply Mupirocin (Bactroban) as instructed Prim Dressing: Maxorb Extra Ag+ Alginate Dressing, 2x2 (in/in) Every Other Day/30 Days ary Discharge Instructions: Apply to wound bed as instructed Secondary Dressing: Woven Gauze Sponge, Non-Sterile 4x4 in Every Other Day/30 Days Discharge Instructions: Apply over primary dressing as directed. Secondary Dressing: Zetuvit Plus 4x8 in Every Other Day/30 Days Discharge Instructions: Apply over primary dressing as directed. Secured With: [employer], 4.5x3.1 (in/yd) Every Other Day/30 Days Discharge Instructions: Secure with Kerlix as directed. Secured With: 81M Medipore H Soft Cloth Surgical T ape, 4 x 10 (in/yd) Every Other Day/30 Days Discharge Instructions: Secure with tape as  directed. 04/13/2023: The wound looks markedly better this week after being in the total contact cast. There is no moisture-related tissue breakdown, and the wound opening is smaller. The wound was prepared for total contact casting. T opical gentamicin and mupirocin were applied to the wound along with silver alginate and zinc oxide was applied to the periwound. [name] total contact cast was then applied in standard fashion. Follow-up in 1 week. Mccreedy, Dvon [name] ([id number]) [id number] Page 9 of 11 Electronic Signature(s) Signed: 04/13/2023 9:43:22 AM By: Duanne Guess MD FACS Entered By: Duanne Guess on 04/13/2023 09:43:22 -------------------------------------------------------------------------------- HxROS Details Patient Name: Date of Service: [name] Pian, [name]. 04/13/2023 9:15 [name] Medical Record Number: [id number] Patient Account Number: [id number] Date of Birth/Sex: Treating RN: [birth year] (67 y.o. M) Primary Care Provider: Arva Chafe Other Clinician: Referring Provider: Treating Provider/Extender: [name] in Treatment: 32 Information Obtained From Patient Respiratory Medical History: Positive for: Sleep Apnea Cardiovascular Medical History: Positive for: Arrhythmia - [name]-Fib; Coronary Artery Disease - s/p CABG; Deep Vein Thrombosis; Hypertension; Myocardial Infarction; Peripheral Arterial Disease - s/p Fempop x 2 Past Medical History Notes: CVA x 3 Endocrine Medical History: Positive for: Type II Diabetes Time with diabetes: since 1997 Treated with: Insulin, Oral agents Blood sugar tested every day: Yes Tested : 2-3x per day Musculoskeletal Medical History: Past Medical History Notes: carpal tunnel syndrome Neurologic Medical History: Positive for: Neuropathy Past Medical History Notes: stroke Oncologic Medical History: Past Medical History Notes: skin cancer Immunizations Pneumococcal  Vaccine: Received Pneumococcal Vaccination: Yes Received Pneumococcal Vaccination On or After 60th Birthday: Yes Implantable Devices None Hospitalization / Surgery History Type of Hospitalization/Surgery colonoscopy polypectomy Lindell, Kimsey [name] ([id number])  DOSE topical 4 %  cream - cream topical Electronic Signature(s) Signed: 04/13/2023 10:42:46 AM By: Duanne Guess MD FACS Entered By: Duanne Guess on 04/13/2023 09:42:36 -------------------------------------------------------------------------------- Problem List Details Patient Name: Date of Service: [name] Pian, [name]. 04/13/2023 9:15 [name] Medical Record Number: [id number] Patient Account Number: [id number] Date of Birth/Sex: Treating RN: 1956/07/16 (67 y.o. M) Primary Care Provider: Arva Chafe Other Clinician: Referring Provider: Treating Provider/Extender: [name] in Treatment: [address], [name] ([id number]) [id number] Page 5 of 11 Active Problems ICD-10 Encounter Code Description Active Date MDM Diagnosis L97.512 Non-pressure chronic ulcer of other part of right foot with fat layer exposed 08/31/2022 No Yes I73.9 Peripheral vascular disease, unspecified 08/31/2022 No Yes I25.10 Atherosclerotic heart disease of native coronary artery without angina pectoris 08/31/2022 No Yes I10 Essential (primary) hypertension 08/31/2022 No Yes E11.65 Type 2 diabetes mellitus with hyperglycemia 08/31/2022 No Yes E11.40 Type 2 diabetes mellitus with diabetic neuropathy, unspecified 08/31/2022 No Yes I63.9 Cerebral infarction, unspecified 08/31/2022 No Yes Inactive Problems Resolved Problems ICD-10 Code Description Active Date Resolved Date L97.821 Non-pressure chronic ulcer of other part of left lower leg limited to breakdown of skin 11/05/2022 11/05/2022 Electronic Signature(s) Signed: 04/13/2023 9:40:46 AM By: Duanne Guess MD FACS Entered By: Duanne Guess on 04/13/2023 09:40:46 -------------------------------------------------------------------------------- Progress Note Details Patient Name: Date of Service: [name] Pian, [name]. 04/13/2023 9:15 [name] Medical Record Number: [id number] Patient Account Number: [id number] Date of Birth/Sex: Treating  RN: 07-Jan-1956 (67 y.o. M) Primary Care Provider: Arva Chafe Other Clinician: Referring Provider: Treating Provider/Extender: [name] in Treatment: 32 Subjective Chief Complaint Information obtained from Patient 05/14/2020; patient is here for review of an abrasion injury on the left lateral calf 08/31/2022: [name] (1st metatarsal base, plantar) History of Present Illness (HPI) ADMISSION 05/14/2020; this is [name] with multiple medical issues. Predominantly he has type 2 diabetes with [name] and also [name], [name] ([id number]) [id number] Page 6 of 11 history of fairly significant PAD. He had [name] in February 2017 he also had an atherectomy and angioplasty by Dr. [name] of the right popliteal artery in 2016. He is supposed to be getting arterial studies annually however this was interrupted last year because of the pandemic. He tells [name] he was at [facility] 2 weeks ago was getting out of of the scooter and traumatized his left lateral lower leg. There was [name] as the patient is on Plavix and Eliquis. They have been dressing this with Neosporin and doing [name] fairly good job. Wound measures 2.5 x 3.5 it does not have any depth he does not have [name] in his legs outside of surgery however he does have chronic edema and skin changes suggestive of chronic venous disease possibly some degree of lymphedema as well. Past medical history includes type 2 diabetes with peripheral neuropathy and gait instability, lumbar spondylosis, obstructive sleep apnea, history of [name], basal cell skin cancer, atrial fibrillation on anticoagulation, significant PAD as noted with [name] and [name] in 30th  2016. He also has [name] of coronary artery disease with an MI in 2002 hypertension hyperlipidemia and heart failure with preserved ejection fraction His last arterial studies I can see in epic were on 03/10/2018 this showed [name] and  Record Number: [id number] Patient Account Number: [id number] Date of Birth/Sex: Treating RN: 04-24-1956 (67 y.o. M) Primary Care Provider: Arva Chafe Other Clinician: Referring Provider: Treating Provider/Extender: [name] in Treatment: 32 Constitutional Hypertensive, asymptomatic. . . . no acute distress. Respiratory Normal work of breathing on room air. Notes 04/13/2023: The wound  looks markedly better this week after being in the total contact cast. There is no moisture-related tissue breakdown, and the wound opening is smaller. Electronic Signature(s) Signed: 04/13/2023 9:42:13 AM By: Duanne Guess MD FACS Entered By: Duanne Guess on 04/13/2023 09:42:12 -------------------------------------------------------------------------------- Physician Orders Details Patient Name: Date of Service: [name] Pian, [name]. 04/13/2023 9:15 [name] Medical Record Number: [id number] Patient Account Number: [id number] Date of Birth/Sex: Treating RN: 02-03-1956 (67 y.o. [name] Primary Care Provider: Arva Chafe Other Clinician: Referring Provider: Treating Provider/Extender: [name] in Treatment: 32 The following information was scribed by: Samuella Bruin The information was scribed for: Duanne Guess Verbal / Phone Orders: No Diagnosis Coding ICD-10 Coding Code Description [phone] Non-pressure chronic ulcer of other part of right foot with fat layer exposed I73.9 Peripheral vascular disease, unspecified I25.10 Atherosclerotic heart disease of native coronary artery without angina pectoris I10 Essential (primary) hypertension E11.65 Type 2 diabetes mellitus with hyperglycemia E11.40 Type 2 diabetes mellitus with diabetic neuropathy, unspecified I63.9 Cerebral infarction, unspecified Follow-up Appointments ppointment in 1 week. - Dr. Lady Gary - room 2 TCC Return [name] Anesthetic (In clinic) Topical Lidocaine 4% applied to wound bed Bathing/ Shower/ Hygiene JAYTIN, [name] ([id number]) [id number] Page 4 of 11 May shower with protection but do not get wound dressing(s) wet. Protect dressing(s) with water repellant cover (for example, large plastic bag) or [name] cast cover and may then take shower. Edema Control - Lymphedema / SCD / Other Elevate legs to the level of the heart or above for 30 minutes  daily and/or when sitting for 3-4 times [name] day throughout the day. Avoid standing for long periods of time. Patient to wear own compression stockings every day. Moisturize legs daily. Off-Loading Total Contact Cast to Right Lower Extremity Removable cast walker boot to: - right leg Wound Treatment Wound #2 - Foot Wound Laterality: Plantar, Right Cleanser: Soap and Water Every Other Day/30 Days Discharge Instructions: May shower and wash wound with dial antibacterial soap and water prior to dressing change. Cleanser: Wound Cleanser Every Other Day/30 Days Discharge Instructions: Cleanse the wound with wound cleanser prior to applying [name] clean dressing using gauze sponges, not tissue or cotton balls. Peri-Wound Care: Zinc Oxide Ointment 30g tube Every Other Day/30 Days Discharge Instructions: Apply Zinc Oxide to periwound with each dressing change Topical: Gentamicin Every Other Day/30 Days Discharge Instructions: As directed by physician Topical: Mupirocin Ointment Every Other Day/30 Days Discharge Instructions: Apply Mupirocin (Bactroban) as instructed Prim Dressing: Maxorb Extra Ag+ Alginate Dressing, 2x2 (in/in) Every Other Day/30 Days ary Discharge Instructions: Apply to wound bed as instructed Secondary Dressing: Woven Gauze Sponge, Non-Sterile 4x4 in Every Other Day/30 Days Discharge Instructions: Apply over primary dressing as directed. Secondary Dressing: Zetuvit Plus 4x8 in Every Other Day/30 Days Discharge Instructions: Apply over primary dressing as directed. Secured With: [employer], 4.5x3.1 (in/yd) Every Other Day/30 Days Discharge Instructions: Secure with Kerlix as directed. Secured With: 84M Medipore H Soft Cloth Surgical T ape, 4 x 10 (in/yd) Every Other Day/30 Days Discharge Instructions: Secure with tape as directed. Patient Medications llergies: No Known Drug Allergies [name] Notifications Medication Indication Start End 04/13/2023 lidocaine  Record Number: [id number] Patient Account Number: [id number] Date of Birth/Sex: Treating RN: 04-24-1956 (67 y.o. M) Primary Care Provider: Arva Chafe Other Clinician: Referring Provider: Treating Provider/Extender: [name] in Treatment: 32 Constitutional Hypertensive, asymptomatic. . . . no acute distress. Respiratory Normal work of breathing on room air. Notes 04/13/2023: The wound  looks markedly better this week after being in the total contact cast. There is no moisture-related tissue breakdown, and the wound opening is smaller. Electronic Signature(s) Signed: 04/13/2023 9:42:13 AM By: Duanne Guess MD FACS Entered By: Duanne Guess on 04/13/2023 09:42:12 -------------------------------------------------------------------------------- Physician Orders Details Patient Name: Date of Service: [name] Pian, [name]. 04/13/2023 9:15 [name] Medical Record Number: [id number] Patient Account Number: [id number] Date of Birth/Sex: Treating RN: 02-03-1956 (67 y.o. [name] Primary Care Provider: Arva Chafe Other Clinician: Referring Provider: Treating Provider/Extender: [name] in Treatment: 32 The following information was scribed by: Samuella Bruin The information was scribed for: Duanne Guess Verbal / Phone Orders: No Diagnosis Coding ICD-10 Coding Code Description [phone] Non-pressure chronic ulcer of other part of right foot with fat layer exposed I73.9 Peripheral vascular disease, unspecified I25.10 Atherosclerotic heart disease of native coronary artery without angina pectoris I10 Essential (primary) hypertension E11.65 Type 2 diabetes mellitus with hyperglycemia E11.40 Type 2 diabetes mellitus with diabetic neuropathy, unspecified I63.9 Cerebral infarction, unspecified Follow-up Appointments ppointment in 1 week. - Dr. Lady Gary - room 2 TCC Return [name] Anesthetic (In clinic) Topical Lidocaine 4% applied to wound bed Bathing/ Shower/ Hygiene JAYTIN, [name] ([id number]) [id number] Page 4 of 11 May shower with protection but do not get wound dressing(s) wet. Protect dressing(s) with water repellant cover (for example, large plastic bag) or [name] cast cover and may then take shower. Edema Control - Lymphedema / SCD / Other Elevate legs to the level of the heart or above for 30 minutes  daily and/or when sitting for 3-4 times [name] day throughout the day. Avoid standing for long periods of time. Patient to wear own compression stockings every day. Moisturize legs daily. Off-Loading Total Contact Cast to Right Lower Extremity Removable cast walker boot to: - right leg Wound Treatment Wound #2 - Foot Wound Laterality: Plantar, Right Cleanser: Soap and Water Every Other Day/30 Days Discharge Instructions: May shower and wash wound with dial antibacterial soap and water prior to dressing change. Cleanser: Wound Cleanser Every Other Day/30 Days Discharge Instructions: Cleanse the wound with wound cleanser prior to applying [name] clean dressing using gauze sponges, not tissue or cotton balls. Peri-Wound Care: Zinc Oxide Ointment 30g tube Every Other Day/30 Days Discharge Instructions: Apply Zinc Oxide to periwound with each dressing change Topical: Gentamicin Every Other Day/30 Days Discharge Instructions: As directed by physician Topical: Mupirocin Ointment Every Other Day/30 Days Discharge Instructions: Apply Mupirocin (Bactroban) as instructed Prim Dressing: Maxorb Extra Ag+ Alginate Dressing, 2x2 (in/in) Every Other Day/30 Days ary Discharge Instructions: Apply to wound bed as instructed Secondary Dressing: Woven Gauze Sponge, Non-Sterile 4x4 in Every Other Day/30 Days Discharge Instructions: Apply over primary dressing as directed. Secondary Dressing: Zetuvit Plus 4x8 in Every Other Day/30 Days Discharge Instructions: Apply over primary dressing as directed. Secured With: [employer], 4.5x3.1 (in/yd) Every Other Day/30 Days Discharge Instructions: Secure with Kerlix as directed. Secured With: 84M Medipore H Soft Cloth Surgical T ape, 4 x 10 (in/yd) Every Other Day/30 Days Discharge Instructions: Secure with tape as directed. Patient Medications llergies: No Known Drug Allergies [name] Notifications Medication Indication Start End 04/13/2023 lidocaine  Record Number: [id number] Patient Account Number: [id number] Date of Birth/Sex: Treating RN: 04-24-1956 (67 y.o. M) Primary Care Provider: Arva Chafe Other Clinician: Referring Provider: Treating Provider/Extender: [name] in Treatment: 32 Constitutional Hypertensive, asymptomatic. . . . no acute distress. Respiratory Normal work of breathing on room air. Notes 04/13/2023: The wound  looks markedly better this week after being in the total contact cast. There is no moisture-related tissue breakdown, and the wound opening is smaller. Electronic Signature(s) Signed: 04/13/2023 9:42:13 AM By: Duanne Guess MD FACS Entered By: Duanne Guess on 04/13/2023 09:42:12 -------------------------------------------------------------------------------- Physician Orders Details Patient Name: Date of Service: [name] Pian, [name]. 04/13/2023 9:15 [name] Medical Record Number: [id number] Patient Account Number: [id number] Date of Birth/Sex: Treating RN: 02-03-1956 (67 y.o. [name] Primary Care Provider: Arva Chafe Other Clinician: Referring Provider: Treating Provider/Extender: [name] in Treatment: 32 The following information was scribed by: Samuella Bruin The information was scribed for: Duanne Guess Verbal / Phone Orders: No Diagnosis Coding ICD-10 Coding Code Description [phone] Non-pressure chronic ulcer of other part of right foot with fat layer exposed I73.9 Peripheral vascular disease, unspecified I25.10 Atherosclerotic heart disease of native coronary artery without angina pectoris I10 Essential (primary) hypertension E11.65 Type 2 diabetes mellitus with hyperglycemia E11.40 Type 2 diabetes mellitus with diabetic neuropathy, unspecified I63.9 Cerebral infarction, unspecified Follow-up Appointments ppointment in 1 week. - Dr. Lady Gary - room 2 TCC Return [name] Anesthetic (In clinic) Topical Lidocaine 4% applied to wound bed Bathing/ Shower/ Hygiene JAYTIN, [name] ([id number]) [id number] Page 4 of 11 May shower with protection but do not get wound dressing(s) wet. Protect dressing(s) with water repellant cover (for example, large plastic bag) or [name] cast cover and may then take shower. Edema Control - Lymphedema / SCD / Other Elevate legs to the level of the heart or above for 30 minutes  daily and/or when sitting for 3-4 times [name] day throughout the day. Avoid standing for long periods of time. Patient to wear own compression stockings every day. Moisturize legs daily. Off-Loading Total Contact Cast to Right Lower Extremity Removable cast walker boot to: - right leg Wound Treatment Wound #2 - Foot Wound Laterality: Plantar, Right Cleanser: Soap and Water Every Other Day/30 Days Discharge Instructions: May shower and wash wound with dial antibacterial soap and water prior to dressing change. Cleanser: Wound Cleanser Every Other Day/30 Days Discharge Instructions: Cleanse the wound with wound cleanser prior to applying [name] clean dressing using gauze sponges, not tissue or cotton balls. Peri-Wound Care: Zinc Oxide Ointment 30g tube Every Other Day/30 Days Discharge Instructions: Apply Zinc Oxide to periwound with each dressing change Topical: Gentamicin Every Other Day/30 Days Discharge Instructions: As directed by physician Topical: Mupirocin Ointment Every Other Day/30 Days Discharge Instructions: Apply Mupirocin (Bactroban) as instructed Prim Dressing: Maxorb Extra Ag+ Alginate Dressing, 2x2 (in/in) Every Other Day/30 Days ary Discharge Instructions: Apply to wound bed as instructed Secondary Dressing: Woven Gauze Sponge, Non-Sterile 4x4 in Every Other Day/30 Days Discharge Instructions: Apply over primary dressing as directed. Secondary Dressing: Zetuvit Plus 4x8 in Every Other Day/30 Days Discharge Instructions: Apply over primary dressing as directed. Secured With: [employer], 4.5x3.1 (in/yd) Every Other Day/30 Days Discharge Instructions: Secure with Kerlix as directed. Secured With: 84M Medipore H Soft Cloth Surgical T ape, 4 x 10 (in/yd) Every Other Day/30 Days Discharge Instructions: Secure with tape as directed. Patient Medications llergies: No Known Drug Allergies [name] Notifications Medication Indication Start End 04/13/2023 lidocaine  Record Number: [id number] Patient Account Number: [id number] Date of Birth/Sex: Treating RN: 04-24-1956 (67 y.o. M) Primary Care Provider: Arva Chafe Other Clinician: Referring Provider: Treating Provider/Extender: [name] in Treatment: 32 Constitutional Hypertensive, asymptomatic. . . . no acute distress. Respiratory Normal work of breathing on room air. Notes 04/13/2023: The wound  looks markedly better this week after being in the total contact cast. There is no moisture-related tissue breakdown, and the wound opening is smaller. Electronic Signature(s) Signed: 04/13/2023 9:42:13 AM By: Duanne Guess MD FACS Entered By: Duanne Guess on 04/13/2023 09:42:12 -------------------------------------------------------------------------------- Physician Orders Details Patient Name: Date of Service: [name] Pian, [name]. 04/13/2023 9:15 [name] Medical Record Number: [id number] Patient Account Number: [id number] Date of Birth/Sex: Treating RN: 02-03-1956 (67 y.o. [name] Primary Care Provider: Arva Chafe Other Clinician: Referring Provider: Treating Provider/Extender: [name] in Treatment: 32 The following information was scribed by: Samuella Bruin The information was scribed for: Duanne Guess Verbal / Phone Orders: No Diagnosis Coding ICD-10 Coding Code Description [phone] Non-pressure chronic ulcer of other part of right foot with fat layer exposed I73.9 Peripheral vascular disease, unspecified I25.10 Atherosclerotic heart disease of native coronary artery without angina pectoris I10 Essential (primary) hypertension E11.65 Type 2 diabetes mellitus with hyperglycemia E11.40 Type 2 diabetes mellitus with diabetic neuropathy, unspecified I63.9 Cerebral infarction, unspecified Follow-up Appointments ppointment in 1 week. - Dr. Lady Gary - room 2 TCC Return [name] Anesthetic (In clinic) Topical Lidocaine 4% applied to wound bed Bathing/ Shower/ Hygiene JAYTIN, [name] ([id number]) [id number] Page 4 of 11 May shower with protection but do not get wound dressing(s) wet. Protect dressing(s) with water repellant cover (for example, large plastic bag) or [name] cast cover and may then take shower. Edema Control - Lymphedema / SCD / Other Elevate legs to the level of the heart or above for 30 minutes  daily and/or when sitting for 3-4 times [name] day throughout the day. Avoid standing for long periods of time. Patient to wear own compression stockings every day. Moisturize legs daily. Off-Loading Total Contact Cast to Right Lower Extremity Removable cast walker boot to: - right leg Wound Treatment Wound #2 - Foot Wound Laterality: Plantar, Right Cleanser: Soap and Water Every Other Day/30 Days Discharge Instructions: May shower and wash wound with dial antibacterial soap and water prior to dressing change. Cleanser: Wound Cleanser Every Other Day/30 Days Discharge Instructions: Cleanse the wound with wound cleanser prior to applying [name] clean dressing using gauze sponges, not tissue or cotton balls. Peri-Wound Care: Zinc Oxide Ointment 30g tube Every Other Day/30 Days Discharge Instructions: Apply Zinc Oxide to periwound with each dressing change Topical: Gentamicin Every Other Day/30 Days Discharge Instructions: As directed by physician Topical: Mupirocin Ointment Every Other Day/30 Days Discharge Instructions: Apply Mupirocin (Bactroban) as instructed Prim Dressing: Maxorb Extra Ag+ Alginate Dressing, 2x2 (in/in) Every Other Day/30 Days ary Discharge Instructions: Apply to wound bed as instructed Secondary Dressing: Woven Gauze Sponge, Non-Sterile 4x4 in Every Other Day/30 Days Discharge Instructions: Apply over primary dressing as directed. Secondary Dressing: Zetuvit Plus 4x8 in Every Other Day/30 Days Discharge Instructions: Apply over primary dressing as directed. Secured With: [employer], 4.5x3.1 (in/yd) Every Other Day/30 Days Discharge Instructions: Secure with Kerlix as directed. Secured With: 84M Medipore H Soft Cloth Surgical T ape, 4 x 10 (in/yd) Every Other Day/30 Days Discharge Instructions: Secure with tape as directed. Patient Medications llergies: No Known Drug Allergies [name] Notifications Medication Indication Start End 04/13/2023 lidocaine

## 2023-04-13 NOTE — Progress Notes (Signed)
[name], [name] ([id number]) [id number] Page 1 of 8 Visit Report for 04/13/2023 Arrival Information Details Patient Name: Date of Service: [name], [name] 04/13/2023 9:15 A M Medical Record Number: [id number] Patient Account Number: [id number] Date of Birth/Sex: Treating RN: [birth year] (67 y.o. [name] Primary Care Collin Rengel: Arva Chafe Other Clinician: Referring Brileigh Sevcik: Treating Arial Galligan/Extender: [name] in Treatment: 32 Visit Information History Since Last Visit Added or deleted any medications: No Patient Arrived: Ambulatory Any new allergies or adverse reactions: No Arrival Time: 09:00 Had a fall or experienced change in No Accompanied By: self activities of daily living that may affect Transfer Assistance: None risk of falls: Patient Identification Verified: Yes Signs or symptoms of abuse/neglect since last visito No Secondary Verification Process Completed: Yes Hospitalized since last visit: No Patient Requires Transmission-Based Precautions: No Implantable device outside of the clinic excluding No Patient Has Alerts: No cellular tissue based products placed in the center since last visit: Has Dressing in Place as Prescribed: Yes Has Footwear/Offloading in Place as Prescribed: Yes Right: T Contact Cast otal Pain Present Now: No Electronic Signature(s) Signed: 04/13/2023 2:18:50 PM By: Samuella Bruin Entered By: Samuella Bruin on 04/13/2023 09:02:23 -------------------------------------------------------------------------------- Encounter Discharge Information Details Patient Name: Date of Service: [name] Pian, [name] 04/13/2023 9:15 A M Medical Record Number: [id number] Patient Account Number: [id number] Date of Birth/Sex: Treating RN: March 15, 1956 (67 y.o. [name] Primary Care Aldean Pipe: Arva Chafe Other Clinician: Referring Raymonde Hamblin: Treating Lolita Faulds/Extender: [name] in Treatment: 32 Encounter Discharge Information Items Discharge Condition: Stable Ambulatory Status: Ambulatory Discharge Destination: Home Transportation: Private Auto Accompanied By: self Schedule Follow-up Appointment: Yes Clinical Summary of Care: Patient Declined Electronic Signature(s) Signed: 04/13/2023 2:18:50 PM By: Samuella Bruin Entered By: Samuella Bruin on 04/13/2023 09:25:15 [name], [name] ([id number]) [id number] Page 2 of 8 -------------------------------------------------------------------------------- Lower Extremity Assessment Details Patient Name: Date of Service: [name], [name] 04/13/2023 9:15 A M Medical Record Number: [id number] Patient Account Number: [id number] Date of Birth/Sex: Treating RN: Aug 21, 1955 (67 y.o. [name] Primary Care Conchita Truxillo: Arva Chafe Other Clinician: Referring [place] Baldwin: Treating Easten Maceachern/Extender: [name] in Treatment: 32 Edema Assessment Assessed: [name]: No] [name]: No] Edema: [Left: Ye] [Right: s] Calf Left: Right: Point of Measurement: From Medial Instep 38 cm Ankle Left: Right: Point of Measurement: From Medial Instep 25 cm Vascular Assessment Pulses: Dorsalis Pedis Palpable: [Right:Yes] Extremity colors, hair growth, and conditions: Extremity Color: [Right:Normal] Hair Growth on Extremity: [Right:Yes] Temperature of Extremity: [Right:Warm] Capillary Refill: [Right:< 3 seconds] Dependent Rubor: [Right:No No] Electronic Signature(s) Signed: 04/13/2023 2:18:50 PM By: Samuella Bruin Entered By: Samuella Bruin on 04/13/2023 09:13:57 -------------------------------------------------------------------------------- Multi Wound Chart Details Patient Name: Date of Service: [name] Pian, [name] 04/13/2023 9:15 A M Medical Record Number: [id number] Patient Account Number: [id number] Date of Birth/Sex: Treating  RN: [birth year] (67 y.o. M) Primary Care Emari Demmer: Arva Chafe Other Clinician: Referring Kyrene Longan: Treating Allexa Acoff/Extender: [name] in Treatment: 32 Vital Signs Height(in): 70 Pulse(bpm): 67 Weight(lbs): 260 Blood Pressure(mmHg): 190/68 Body Mass Index(BMI): 37.3 Temperature(F): 97.9 Respiratory Rate(breaths/min): 16 [2:Photos:] [N/A:N/A] Right, Plantar Foot N/A N/A Wound Location: Gradually Appeared N/A N/A Wounding Event: Diabetic Wound/Ulcer of the Lower N/A N/A Primary Etiology: Extremity Sleep Apnea, Arrhythmia, Coronary N/A N/A Comorbid History: Artery Disease, Deep Vein Thrombosis, Hypertension, Myocardial Infarction, Peripheral Arterial Disease, Type II Diabetes, Neuropathy 07/08/2022 N/A N/A Date Acquired: 33 N/A N/A Weeks of Treatment: Open N/A N/A Wound Status: No N/A N/A Wound  Number: [id number] Patient Account Number: [id number] Date of Birth/Sex: Treating RN: 11-16-1955 (67 y.o. [name] Primary Care Odas Ozer: Arva Chafe Other Clinician: Referring Velia Pamer: Treating Kristen Bushway/Extender: [name] in Treatment: 32 Active Problems Location of Pain Severity and Description of Pain Patient Has Paino No Site  Locations Rate the pain. Current Pain Level: 0 Pain Management and Medication Current Pain Management: Electronic Signature(s) Signed: 04/13/2023 2:18:50 PM By: Samuella Bruin Entered By: Samuella Bruin on 04/13/2023 09:05:01 -------------------------------------------------------------------------------- Patient/Caregiver Education Details Patient Name: Date of Service: [name] 9/24/2024andnbsp9:15 A M Medical Record Number: [id number] Patient Account Number: [id number] Date of Birth/Gender: Treating RN: 02/14/1956 (67 y.o. [name] Primary Care Physician: Arva Chafe Other Clinician: Referring Physician: Treating Physician/Extender: [name] in Treatment: [address] Education Assessment [place], [place] A ([id number]) [id number] Page 6 of 8 Education Provided To: Patient Education Topics Provided Wound/Skin Impairment: Methods: Explain/Verbal Responses: Reinforcements needed, State content correctly Electronic Signature(s) Signed: 04/13/2023 2:18:50 PM By: Samuella Bruin Entered By: Samuella Bruin on 04/13/2023 09:17:18 -------------------------------------------------------------------------------- Wound Assessment Details Patient Name: Date of Service: [name] Pian, [name] 04/13/2023 9:15 A M Medical Record Number: [id number] Patient Account Number: [id number] Date of Birth/Sex: Treating RN: 31-Dec-1955 (67 y.o. [name] Primary Care Darchelle Nunes: Arva Chafe Other Clinician: Referring Shianne Zeiser: Treating Marisue Canion/Extender: [name] in Treatment: 32 Wound Status Wound Number: 2 Primary Diabetic Wound/Ulcer of the Lower Extremity Etiology: Wound Location: Right, Plantar Foot Wound Open Wounding Event: Gradually Appeared Status: Date Acquired: 07/08/2022 Comorbid Sleep Apnea, Arrhythmia, Coronary Artery Disease, Deep Vein Weeks Of Treatment:  32 History: Thrombosis, Hypertension, Myocardial Infarction, Peripheral Arterial Clustered Wound: No Disease, Type II Diabetes, Neuropathy Photos Wound Measurements Length: (cm) 0.6 Width: (cm) 1.6 Depth: (cm) 0.1 Area: (cm) 0.754 Volume: (cm) 0.075 % Reduction in Area: 70.5% % Reduction in Volume: 70.6% Epithelialization: Medium (34-66%) Tunneling: No Undermining: No Wound Description Classification: Grade 1 Wound Margin: Distinct, outline attached Exudate Amount: Medium Exudate Type: Serosanguineous Exudate Color: red, brown Foul Odor After Cleansing: No Slough/Fibrino Yes Wound Bed Granulation Amount: Large (67-100%) Exposed Structure Granulation Quality: Red Fascia Exposed: No Necrotic Amount: Small (1-33%) Fat Layer (Subcutaneous Tissue) Exposed: Yes Necrotic Quality: Adherent Slough Tendon Exposed: No Muscle Exposed: No Quijas, Creighton A ([id number]) [id number] Page 7 of 8 Joint Exposed: No Bone Exposed: No Periwound Skin Texture Texture Color No Abnormalities Noted: No No Abnormalities Noted: Yes Callus: Yes Temperature / Pain Temperature: No Abnormality Moisture No Abnormalities Noted: No Dry / Scaly: No Maceration: Yes Treatment Notes Wound #2 (Foot) Wound Laterality: Plantar, Right Cleanser Soap and Water Discharge Instruction: May shower and wash wound with dial antibacterial soap and water prior to dressing change. Wound Cleanser Discharge Instruction: Cleanse the wound with wound cleanser prior to applying a clean dressing using gauze sponges, not tissue or cotton balls. Peri-Wound Care Zinc Oxide Ointment 30g tube Discharge Instruction: Apply Zinc Oxide to periwound with each dressing change Topical Gentamicin Discharge Instruction: As directed by physician Mupirocin Ointment Discharge Instruction: Apply Mupirocin (Bactroban) as instructed Primary Dressing Maxorb Extra Ag+ Alginate Dressing, 2x2 (in/in) Discharge  Instruction: Apply to wound bed as instructed Secondary Dressing Woven Gauze Sponge, Non-Sterile 4x4 in Discharge Instruction: Apply over primary dressing as directed. Zetuvit Plus 4x8 in Discharge Instruction: Apply over primary dressing as directed. Secured With [employer], 4.5x3.1 (in/yd) Discharge Instruction: Secure with Kerlix as directed. 33M Medipore H Soft Cloth Surgical T ape, 4 x 10 (in/yd) Discharge Instruction: Secure  [name], [name] ([id number]) [id number] Page 1 of 8 Visit Report for 04/13/2023 Arrival Information Details Patient Name: Date of Service: [name], [name] 04/13/2023 9:15 A M Medical Record Number: [id number] Patient Account Number: [id number] Date of Birth/Sex: Treating RN: [birth year] (67 y.o. [name] Primary Care Collin Rengel: Arva Chafe Other Clinician: Referring Brileigh Sevcik: Treating Arial Galligan/Extender: [name] in Treatment: 32 Visit Information History Since Last Visit Added or deleted any medications: No Patient Arrived: Ambulatory Any new allergies or adverse reactions: No Arrival Time: 09:00 Had a fall or experienced change in No Accompanied By: self activities of daily living that may affect Transfer Assistance: None risk of falls: Patient Identification Verified: Yes Signs or symptoms of abuse/neglect since last visito No Secondary Verification Process Completed: Yes Hospitalized since last visit: No Patient Requires Transmission-Based Precautions: No Implantable device outside of the clinic excluding No Patient Has Alerts: No cellular tissue based products placed in the center since last visit: Has Dressing in Place as Prescribed: Yes Has Footwear/Offloading in Place as Prescribed: Yes Right: T Contact Cast otal Pain Present Now: No Electronic Signature(s) Signed: 04/13/2023 2:18:50 PM By: Samuella Bruin Entered By: Samuella Bruin on 04/13/2023 09:02:23 -------------------------------------------------------------------------------- Encounter Discharge Information Details Patient Name: Date of Service: [name] Pian, [name] 04/13/2023 9:15 A M Medical Record Number: [id number] Patient Account Number: [id number] Date of Birth/Sex: Treating RN: March 15, 1956 (67 y.o. [name] Primary Care Aldean Pipe: Arva Chafe Other Clinician: Referring Raymonde Hamblin: Treating Lolita Faulds/Extender: [name] in Treatment: 32 Encounter Discharge Information Items Discharge Condition: Stable Ambulatory Status: Ambulatory Discharge Destination: Home Transportation: Private Auto Accompanied By: self Schedule Follow-up Appointment: Yes Clinical Summary of Care: Patient Declined Electronic Signature(s) Signed: 04/13/2023 2:18:50 PM By: Samuella Bruin Entered By: Samuella Bruin on 04/13/2023 09:25:15 [name], [name] ([id number]) [id number] Page 2 of 8 -------------------------------------------------------------------------------- Lower Extremity Assessment Details Patient Name: Date of Service: [name], [name] 04/13/2023 9:15 A M Medical Record Number: [id number] Patient Account Number: [id number] Date of Birth/Sex: Treating RN: Aug 21, 1955 (67 y.o. [name] Primary Care Conchita Truxillo: Arva Chafe Other Clinician: Referring [place] Baldwin: Treating Easten Maceachern/Extender: [name] in Treatment: 32 Edema Assessment Assessed: [name]: No] [name]: No] Edema: [Left: Ye] [Right: s] Calf Left: Right: Point of Measurement: From Medial Instep 38 cm Ankle Left: Right: Point of Measurement: From Medial Instep 25 cm Vascular Assessment Pulses: Dorsalis Pedis Palpable: [Right:Yes] Extremity colors, hair growth, and conditions: Extremity Color: [Right:Normal] Hair Growth on Extremity: [Right:Yes] Temperature of Extremity: [Right:Warm] Capillary Refill: [Right:< 3 seconds] Dependent Rubor: [Right:No No] Electronic Signature(s) Signed: 04/13/2023 2:18:50 PM By: Samuella Bruin Entered By: Samuella Bruin on 04/13/2023 09:13:57 -------------------------------------------------------------------------------- Multi Wound Chart Details Patient Name: Date of Service: [name] Pian, [name] 04/13/2023 9:15 A M Medical Record Number: [id number] Patient Account Number: [id number] Date of Birth/Sex: Treating  RN: [birth year] (67 y.o. M) Primary Care Emari Demmer: Arva Chafe Other Clinician: Referring Kyrene Longan: Treating Allexa Acoff/Extender: [name] in Treatment: 32 Vital Signs Height(in): 70 Pulse(bpm): 67 Weight(lbs): 260 Blood Pressure(mmHg): 190/68 Body Mass Index(BMI): 37.3 Temperature(F): 97.9 Respiratory Rate(breaths/min): 16 [2:Photos:] [N/A:N/A] Right, Plantar Foot N/A N/A Wound Location: Gradually Appeared N/A N/A Wounding Event: Diabetic Wound/Ulcer of the Lower N/A N/A Primary Etiology: Extremity Sleep Apnea, Arrhythmia, Coronary N/A N/A Comorbid History: Artery Disease, Deep Vein Thrombosis, Hypertension, Myocardial Infarction, Peripheral Arterial Disease, Type II Diabetes, Neuropathy 07/08/2022 N/A N/A Date Acquired: 33 N/A N/A Weeks of Treatment: Open N/A N/A Wound Status: No N/A N/A Wound  Number: [id number] Patient Account Number: [id number] Date of Birth/Sex: Treating RN: 11-16-1955 (67 y.o. [name] Primary Care Odas Ozer: Arva Chafe Other Clinician: Referring Velia Pamer: Treating Kristen Bushway/Extender: [name] in Treatment: 32 Active Problems Location of Pain Severity and Description of Pain Patient Has Paino No Site  Locations Rate the pain. Current Pain Level: 0 Pain Management and Medication Current Pain Management: Electronic Signature(s) Signed: 04/13/2023 2:18:50 PM By: Samuella Bruin Entered By: Samuella Bruin on 04/13/2023 09:05:01 -------------------------------------------------------------------------------- Patient/Caregiver Education Details Patient Name: Date of Service: [name] 9/24/2024andnbsp9:15 A M Medical Record Number: [id number] Patient Account Number: [id number] Date of Birth/Gender: Treating RN: 02/14/1956 (67 y.o. [name] Primary Care Physician: Arva Chafe Other Clinician: Referring Physician: Treating Physician/Extender: [name] in Treatment: [address] Education Assessment [place], [place] A ([id number]) [id number] Page 6 of 8 Education Provided To: Patient Education Topics Provided Wound/Skin Impairment: Methods: Explain/Verbal Responses: Reinforcements needed, State content correctly Electronic Signature(s) Signed: 04/13/2023 2:18:50 PM By: Samuella Bruin Entered By: Samuella Bruin on 04/13/2023 09:17:18 -------------------------------------------------------------------------------- Wound Assessment Details Patient Name: Date of Service: [name] Pian, [name] 04/13/2023 9:15 A M Medical Record Number: [id number] Patient Account Number: [id number] Date of Birth/Sex: Treating RN: 31-Dec-1955 (67 y.o. [name] Primary Care Darchelle Nunes: Arva Chafe Other Clinician: Referring Shianne Zeiser: Treating Marisue Canion/Extender: [name] in Treatment: 32 Wound Status Wound Number: 2 Primary Diabetic Wound/Ulcer of the Lower Extremity Etiology: Wound Location: Right, Plantar Foot Wound Open Wounding Event: Gradually Appeared Status: Date Acquired: 07/08/2022 Comorbid Sleep Apnea, Arrhythmia, Coronary Artery Disease, Deep Vein Weeks Of Treatment:  32 History: Thrombosis, Hypertension, Myocardial Infarction, Peripheral Arterial Clustered Wound: No Disease, Type II Diabetes, Neuropathy Photos Wound Measurements Length: (cm) 0.6 Width: (cm) 1.6 Depth: (cm) 0.1 Area: (cm) 0.754 Volume: (cm) 0.075 % Reduction in Area: 70.5% % Reduction in Volume: 70.6% Epithelialization: Medium (34-66%) Tunneling: No Undermining: No Wound Description Classification: Grade 1 Wound Margin: Distinct, outline attached Exudate Amount: Medium Exudate Type: Serosanguineous Exudate Color: red, brown Foul Odor After Cleansing: No Slough/Fibrino Yes Wound Bed Granulation Amount: Large (67-100%) Exposed Structure Granulation Quality: Red Fascia Exposed: No Necrotic Amount: Small (1-33%) Fat Layer (Subcutaneous Tissue) Exposed: Yes Necrotic Quality: Adherent Slough Tendon Exposed: No Muscle Exposed: No Quijas, Creighton A ([id number]) [id number] Page 7 of 8 Joint Exposed: No Bone Exposed: No Periwound Skin Texture Texture Color No Abnormalities Noted: No No Abnormalities Noted: Yes Callus: Yes Temperature / Pain Temperature: No Abnormality Moisture No Abnormalities Noted: No Dry / Scaly: No Maceration: Yes Treatment Notes Wound #2 (Foot) Wound Laterality: Plantar, Right Cleanser Soap and Water Discharge Instruction: May shower and wash wound with dial antibacterial soap and water prior to dressing change. Wound Cleanser Discharge Instruction: Cleanse the wound with wound cleanser prior to applying a clean dressing using gauze sponges, not tissue or cotton balls. Peri-Wound Care Zinc Oxide Ointment 30g tube Discharge Instruction: Apply Zinc Oxide to periwound with each dressing change Topical Gentamicin Discharge Instruction: As directed by physician Mupirocin Ointment Discharge Instruction: Apply Mupirocin (Bactroban) as instructed Primary Dressing Maxorb Extra Ag+ Alginate Dressing, 2x2 (in/in) Discharge  Instruction: Apply to wound bed as instructed Secondary Dressing Woven Gauze Sponge, Non-Sterile 4x4 in Discharge Instruction: Apply over primary dressing as directed. Zetuvit Plus 4x8 in Discharge Instruction: Apply over primary dressing as directed. Secured With [employer], 4.5x3.1 (in/yd) Discharge Instruction: Secure with Kerlix as directed. 33M Medipore H Soft Cloth Surgical T ape, 4 x 10 (in/yd) Discharge Instruction: Secure

## 2023-04-15 ENCOUNTER — Encounter (HOSPITAL_COMMUNITY): Payer: Self-pay | Admitting: Gastroenterology

## 2023-04-19 DIAGNOSIS — G4733 Obstructive sleep apnea (adult) (pediatric): Secondary | ICD-10-CM | POA: Diagnosis not present

## 2023-04-20 ENCOUNTER — Encounter (HOSPITAL_BASED_OUTPATIENT_CLINIC_OR_DEPARTMENT_OTHER): Payer: Medicare HMO | Attending: General Surgery | Admitting: General Surgery

## 2023-04-20 DIAGNOSIS — E1142 Type 2 diabetes mellitus with diabetic polyneuropathy: Secondary | ICD-10-CM | POA: Diagnosis not present

## 2023-04-20 DIAGNOSIS — I2510 Atherosclerotic heart disease of native coronary artery without angina pectoris: Secondary | ICD-10-CM | POA: Diagnosis not present

## 2023-04-20 DIAGNOSIS — E11621 Type 2 diabetes mellitus with foot ulcer: Secondary | ICD-10-CM | POA: Diagnosis not present

## 2023-04-20 DIAGNOSIS — L97512 Non-pressure chronic ulcer of other part of right foot with fat layer exposed: Secondary | ICD-10-CM | POA: Diagnosis not present

## 2023-04-20 DIAGNOSIS — Z951 Presence of aortocoronary bypass graft: Secondary | ICD-10-CM | POA: Insufficient documentation

## 2023-04-20 DIAGNOSIS — Z8673 Personal history of transient ischemic attack (TIA), and cerebral infarction without residual deficits: Secondary | ICD-10-CM | POA: Diagnosis not present

## 2023-04-20 DIAGNOSIS — I5032 Chronic diastolic (congestive) heart failure: Secondary | ICD-10-CM | POA: Insufficient documentation

## 2023-04-20 DIAGNOSIS — Z7902 Long term (current) use of antithrombotics/antiplatelets: Secondary | ICD-10-CM | POA: Insufficient documentation

## 2023-04-20 DIAGNOSIS — E785 Hyperlipidemia, unspecified: Secondary | ICD-10-CM | POA: Insufficient documentation

## 2023-04-20 DIAGNOSIS — G4733 Obstructive sleep apnea (adult) (pediatric): Secondary | ICD-10-CM | POA: Diagnosis not present

## 2023-04-20 DIAGNOSIS — Z86718 Personal history of other venous thrombosis and embolism: Secondary | ICD-10-CM | POA: Insufficient documentation

## 2023-04-20 DIAGNOSIS — I110 Hypertensive heart disease with heart failure: Secondary | ICD-10-CM | POA: Diagnosis not present

## 2023-04-20 DIAGNOSIS — I872 Venous insufficiency (chronic) (peripheral): Secondary | ICD-10-CM | POA: Diagnosis not present

## 2023-04-20 DIAGNOSIS — E1151 Type 2 diabetes mellitus with diabetic peripheral angiopathy without gangrene: Secondary | ICD-10-CM | POA: Diagnosis not present

## 2023-04-20 DIAGNOSIS — Z7901 Long term (current) use of anticoagulants: Secondary | ICD-10-CM | POA: Insufficient documentation

## 2023-04-20 NOTE — Progress Notes (Signed)
Hochman, Vimal [name] ([id number]) [id number] Page 1 of 7 Visit Report for 04/20/2023 Arrival Information Details Patient Name: Date of Service: [name], [name]. 04/20/2023 9:15 [name] Medical Record Number: [id number] Patient Account Number: [id number] Date of Birth/Sex: Treating RN: [birth year] (67 y.o. [name] Primary Care Neftaly Swiss: Arva Chafe Other Clinician: Referring Luzelena Heeg: Treating Chelcee Korpi/Extender: [name] in Treatment: 33 Visit Information History Since Last Visit Added or deleted any medications: No Patient Arrived: Ambulatory Any new allergies or adverse reactions: No Arrival Time: 09:02 Had [name] fall or experienced change in No Accompanied By: self activities of daily living that may affect Transfer Assistance: None risk of falls: Patient Identification Verified: Yes Signs or symptoms of abuse/neglect since last visito No Secondary Verification Process Completed: Yes Hospitalized since last visit: No Patient Requires Transmission-Based Precautions: No Implantable device outside of the clinic excluding No Patient Has Alerts: No cellular tissue based products placed in the center since last visit: Has Dressing in Place as Prescribed: Yes Has Footwear/Offloading in Place as Prescribed: Yes Right: T Contact Cast otal Pain Present Now: No Electronic Signature(s) Signed: 04/20/2023 3:42:47 PM By: Samuella Bruin Entered By: Samuella Bruin on 04/20/2023 09:05:01 -------------------------------------------------------------------------------- Encounter Discharge Information Details Patient Name: Date of Service: [name] Pian, [name]. 04/20/2023 9:15 [name] Medical Record Number: [id number] Patient Account Number: [id number] Date of Birth/Sex: Treating RN: 05-31-1956 (67 y.o. [name] Primary Care Marquavion Venhuizen: Arva Chafe Other Clinician: Referring Keylin Ferryman: Treating Gregori Abril/Extender: [name] in Treatment: 33 Encounter Discharge Information Items Discharge Condition: Stable Ambulatory Status: Ambulatory Discharge Destination: Home Transportation: Private Auto Accompanied By: self Schedule Follow-up Appointment: Yes Clinical Summary of Care: Patient Declined Electronic Signature(s) Signed: 04/20/2023 3:42:47 PM By: Samuella Bruin Entered By: Samuella Bruin on 04/20/2023 09:42:17 Moor, Nathanuel [name] ([id number]) [id number] Page 2 of 7 -------------------------------------------------------------------------------- Lower Extremity Assessment Details Patient Name: Date of Service: [name], [name]. 04/20/2023 9:15 [name] Medical Record Number: [id number] Patient Account Number: [id number] Date of Birth/Sex: Treating RN: [birth year] (67 y.o. [name] Primary Care Blair Mesina: Arva Chafe Other Clinician: Referring Stephan Draughn: Treating Gerda Yin/Extender: [name] in Treatment: 33 Edema Assessment Assessed: [name]: No] [name]: No] Edema: [Left: Ye] [Right: s] Calf Left: Right: Point of Measurement: From Medial Instep 37.5 cm Ankle Left: Right: Point of Measurement: From Medial Instep 25.5 cm Vascular Assessment Pulses: Dorsalis Pedis Palpable: [Right:Yes] Extremity colors, hair growth, and conditions: Extremity Color: [Right:Normal] Hair Growth on Extremity: [Right:Yes] Temperature of Extremity: [Right:Warm] Capillary Refill: [Right:< 3 seconds] Dependent Rubor: [Right:No No] Electronic Signature(s) Signed: 04/20/2023 3:42:47 PM By: Samuella Bruin Entered By: Samuella Bruin on 04/20/2023 09:11:17 -------------------------------------------------------------------------------- Multi Wound Chart Details Patient Name: Date of Service: [name] Pian, [name]. 04/20/2023 9:15 [name] Medical Record Number: [id number] Patient Account Number: [id number] Date of Birth/Sex:  Treating RN: 23-Sep-1955 (67 y.o. 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 04/20/2023 9:15 [name] Medical Record Number: [id number] Patient Account Number: [id number] Date of Birth/Sex: Treating RN: [birth year] (67 y.o. [name] Primary Care Sandhya Denherder: Arva Chafe Other Clinician: Referring Leira Regino: Treating Rosaelena Kemnitz/Extender: Ocie Doyne [place], Darragh [name] ([id number]) [id number] Page 6 of 7 Weeks in Treatment: 33 Wound Status Wound Number: 2 Primary Diabetic Wound/Ulcer of the Lower Extremity Etiology: Wound Location: Right, Plantar Foot Wound Open Wounding Event: Gradually Appeared Status: Date Acquired: 07/08/2022 Comorbid Sleep Apnea, Arrhythmia, Coronary Artery Disease, Deep Vein Weeks Of Treatment: 33 History: Thrombosis, Hypertension, Myocardial Infarction, Peripheral Arterial Clustered Wound: No Disease, Type II Diabetes, Neuropathy Photos Wound Measurements Length: (cm) 0.4 Width: (cm) 1 Depth: (cm) 0.1 Area: (cm) 0.314 Volume: (cm) 0.031 % Reduction in Area: 87.7% % Reduction in Volume: 87.8% Epithelialization: Medium (34-66%) Tunneling: No Undermining: No Wound Description Classification: Grade 1 Wound Margin: Distinct, outline attached Exudate Amount: Medium Exudate Type: Serosanguineous Exudate Color: red, brown Foul Odor After Cleansing: No Slough/Fibrino Yes Wound Bed Granulation Amount: Large (67-100%) Exposed Structure Granulation Quality: Red Fascia Exposed: No Necrotic Amount: Small (1-33%) Fat Layer (Subcutaneous Tissue) Exposed: Yes Necrotic Quality: Adherent Slough Tendon Exposed: No Muscle Exposed: No Joint Exposed: No Bone Exposed: No Periwound Skin Texture Texture Color No Abnormalities Noted: No No Abnormalities Noted: Yes Callus: Yes Temperature / Pain Temperature: No Abnormality Moisture No Abnormalities Noted: Yes Treatment Notes Wound #2 (Foot) Wound Laterality: Plantar, Right Cleanser Soap and Water Discharge  Instruction: May shower and wash wound with dial antibacterial soap and water prior to dressing change. Wound Cleanser Discharge Instruction: Cleanse the wound with wound cleanser prior to applying [name] clean dressing using gauze sponges, not tissue or cotton balls. Peri-Wound Care Zinc Oxide Ointment 30g tube Discharge Instruction: Apply Zinc Oxide to periwound with each dressing change Topical Gentamicin Discharge Instruction: As directed by physician [name], [name] ([id number]) [id number] Page 7 of 7 Mupirocin Ointment Discharge Instruction: Apply Mupirocin (Bactroban) as instructed Primary Dressing Maxorb Extra Ag+ Alginate Dressing, 2x2 (in/in) Discharge Instruction: Apply to wound bed as instructed Secondary Dressing Woven Gauze Sponge, Non-Sterile 4x4 in Discharge Instruction: Apply over primary dressing as directed. Zetuvit Plus 4x8 in Discharge Instruction: Apply over primary dressing as directed. Secured With [employer], 4.5x3.1 (in/yd) Discharge Instruction: Secure with Kerlix as directed. 62M Medipore H Soft Cloth Surgical T ape, 4 x 10 (in/yd) Discharge Instruction: Secure with tape as directed. Compression Wrap Compression Stockings Add-Ons Electronic Signature(s) Signed: 04/20/2023 3:42:47 PM By: Samuella Bruin Entered By: Samuella Bruin on 04/20/2023 09:17:10 -------------------------------------------------------------------------------- Vitals Details Patient Name: Date of Service: [name] Pian, [name]. 04/20/2023 9:15 [name] Medical Record Number: [id number] Patient Account Number: [id number] Date of Birth/Sex: Treating RN: [birth year] (67 y.o. [name] Primary Care Bianey Tesoro: Arva Chafe Other Clinician: Referring Ginamarie Banfield: Treating Wandalene Abrams/Extender: [name] in Treatment: 33 Vital Signs Time Taken: 09:05 Temperature (F): 97.9 Height (in): 70 Pulse (bpm): 86 Weight (lbs):  260 Respiratory Rate (breaths/min): 20 Body Mass Index (BMI): 37.3 Blood Pressure (mmHg): 196/72 Reference Range: 80 - 120 mg / dl Electronic Signature(s) Signed: 04/20/2023 3:42:47 PM By: Samuella Bruin Entered By: Samuella Bruin on 04/20/2023 09:05:45  on 04/20/2023 09:41:56 -------------------------------------------------------------------------------- Wound Assessment Details Patient Name: Date of  Service: [name], [name]. 04/20/2023 9:15 [name] Medical Record Number: [id number] Patient Account Number: [id number] Date of Birth/Sex: Treating RN: [birth year] (67 y.o. [name] Primary Care Sandhya Denherder: Arva Chafe Other Clinician: Referring Leira Regino: Treating Rosaelena Kemnitz/Extender: Ocie Doyne [place], Darragh [name] ([id number]) [id number] Page 6 of 7 Weeks in Treatment: 33 Wound Status Wound Number: 2 Primary Diabetic Wound/Ulcer of the Lower Extremity Etiology: Wound Location: Right, Plantar Foot Wound Open Wounding Event: Gradually Appeared Status: Date Acquired: 07/08/2022 Comorbid Sleep Apnea, Arrhythmia, Coronary Artery Disease, Deep Vein Weeks Of Treatment: 33 History: Thrombosis, Hypertension, Myocardial Infarction, Peripheral Arterial Clustered Wound: No Disease, Type II Diabetes, Neuropathy Photos Wound Measurements Length: (cm) 0.4 Width: (cm) 1 Depth: (cm) 0.1 Area: (cm) 0.314 Volume: (cm) 0.031 % Reduction in Area: 87.7% % Reduction in Volume: 87.8% Epithelialization: Medium (34-66%) Tunneling: No Undermining: No Wound Description Classification: Grade 1 Wound Margin: Distinct, outline attached Exudate Amount: Medium Exudate Type: Serosanguineous Exudate Color: red, brown Foul Odor After Cleansing: No Slough/Fibrino Yes Wound Bed Granulation Amount: Large (67-100%) Exposed Structure Granulation Quality: Red Fascia Exposed: No Necrotic Amount: Small (1-33%) Fat Layer (Subcutaneous Tissue) Exposed: Yes Necrotic Quality: Adherent Slough Tendon Exposed: No Muscle Exposed: No Joint Exposed: No Bone Exposed: No Periwound Skin Texture Texture Color No Abnormalities Noted: No No Abnormalities Noted: Yes Callus: Yes Temperature / Pain Temperature: No Abnormality Moisture No Abnormalities Noted: Yes Treatment Notes Wound #2 (Foot) Wound Laterality: Plantar, Right Cleanser Soap and Water Discharge  Instruction: May shower and wash wound with dial antibacterial soap and water prior to dressing change. Wound Cleanser Discharge Instruction: Cleanse the wound with wound cleanser prior to applying [name] clean dressing using gauze sponges, not tissue or cotton balls. Peri-Wound Care Zinc Oxide Ointment 30g tube Discharge Instruction: Apply Zinc Oxide to periwound with each dressing change Topical Gentamicin Discharge Instruction: As directed by physician [name], [name] ([id number]) [id number] Page 7 of 7 Mupirocin Ointment Discharge Instruction: Apply Mupirocin (Bactroban) as instructed Primary Dressing Maxorb Extra Ag+ Alginate Dressing, 2x2 (in/in) Discharge Instruction: Apply to wound bed as instructed Secondary Dressing Woven Gauze Sponge, Non-Sterile 4x4 in Discharge Instruction: Apply over primary dressing as directed. Zetuvit Plus 4x8 in Discharge Instruction: Apply over primary dressing as directed. Secured With [employer], 4.5x3.1 (in/yd) Discharge Instruction: Secure with Kerlix as directed. 62M Medipore H Soft Cloth Surgical T ape, 4 x 10 (in/yd) Discharge Instruction: Secure with tape as directed. Compression Wrap Compression Stockings Add-Ons Electronic Signature(s) Signed: 04/20/2023 3:42:47 PM By: Samuella Bruin Entered By: Samuella Bruin on 04/20/2023 09:17:10 -------------------------------------------------------------------------------- Vitals Details Patient Name: Date of Service: [name] Pian, [name]. 04/20/2023 9:15 [name] Medical Record Number: [id number] Patient Account Number: [id number] Date of Birth/Sex: Treating RN: [birth year] (67 y.o. [name] Primary Care Bianey Tesoro: Arva Chafe Other Clinician: Referring Ginamarie Banfield: Treating Wandalene Abrams/Extender: [name] in Treatment: 33 Vital Signs Time Taken: 09:05 Temperature (F): 97.9 Height (in): 70 Pulse (bpm): 86 Weight (lbs):  260 Respiratory Rate (breaths/min): 20 Body Mass Index (BMI): 37.3 Blood Pressure (mmHg): 196/72 Reference Range: 80 - 120 mg / dl Electronic Signature(s) Signed: 04/20/2023 3:42:47 PM By: Samuella Bruin Entered By: Samuella Bruin on 04/20/2023 09:05:45

## 2023-04-20 NOTE — Progress Notes (Signed)
week. - Dr. Lady Gary - room 2 TCC Anesthetic: (In clinic) Topical Lidocaine 4% applied to wound bed Bathing/ Shower/ Hygiene: May shower with protection but do not get wound dressing(s) wet. Protect dressing(s) with water repellant cover (for example, large plastic bag) or [name] cast cover and  may then take shower. Edema Control - Lymphedema / SCD / Other: Elevate legs to the level of the heart or above for 30 minutes daily and/or when sitting for 3-4 times [name] day throughout the day. Avoid standing for long periods of time. Patient to wear own compression stockings every day. Moisturize legs daily. Off-Loading: T Contact Cast to Right Lower Extremity otal Removable cast walker boot to: - right leg The following medication(s) was prescribed: lidocaine topical 4 % cream cream topical was prescribed at facility WOUND #2: - Foot Wound Laterality: Plantar, Right Cleanser: Soap and Water 1 x Per Week/30 Days Discharge Instructions: May shower and wash wound with dial antibacterial soap and water prior to dressing change. Cleanser: Wound Cleanser 1 x Per Week/30 Days Discharge Instructions: Cleanse the wound with wound cleanser prior to applying [name] clean dressing using gauze sponges, not tissue or cotton balls. Peri-Wound Care: Zinc Oxide Ointment 30g tube 1 x Per Week/30 Days Discharge Instructions: Apply Zinc Oxide to periwound with each dressing change Topical: Gentamicin 1 x Per Week/30 Days Discharge Instructions: As directed by physician Topical: Mupirocin Ointment 1 x Per Week/30 Days Discharge Instructions: Apply Mupirocin (Bactroban) as instructed Prim Dressing: Maxorb Extra Ag+ Alginate Dressing, 2x2 (in/in) 1 x Per Week/30 Days ary Discharge Instructions: Apply to wound bed as instructed Secondary Dressing: Woven Gauze Sponge, Non-Sterile 4x4 in 1 x Per Week/30 Days Discharge Instructions: Apply over primary dressing as directed. Secondary Dressing: Zetuvit Plus 4x8 in 1 x Per Week/30 Days Discharge Instructions: Apply over primary dressing as directed. Secured With: [employer], 4.5x3.1 (in/yd) 1 x Per Week/30 Days Discharge Instructions: Secure with Kerlix as directed. Secured With: 62M Medipore H Soft Cloth Surgical T ape, 4 x 10 (in/yd) 1 x Per Week/30  Days Discharge Instructions: Secure with tape as directed. 04/20/2023: The wound is smaller again today. The periwound skin is nice and dry and there is no concern for infection. The wound bed was prepared for total contact casting. Topical gentamicin and mupirocin were applied, followed by silver alginate. The total contact cast was then applied in standard fashion. Follow-up in 1 week. [name], [name] ([id number]) [id number] Page 9 of 11 Electronic Signature(s) Signed: 04/20/2023 9:58:14 AM By: Duanne Guess MD FACS Signed: 04/20/2023 3:42:47 PM By: Samuella Bruin Previous Signature: 04/20/2023 9:40:29 AM Version By: Duanne Guess MD FACS Entered By: Samuella Bruin on 04/20/2023 09:42:45 -------------------------------------------------------------------------------- HxROS Details Patient Name: Date of Service: [name] Pian, [name]. 04/20/2023 9:15 [name] Medical Record Number: [id number] Patient Account Number: [id number] Date of Birth/Sex: Treating RN: [birth year] (67 y.o. M) Primary Care Provider: Arva Chafe Other Clinician: Referring Provider: Treating Provider/Extender: [name] in Treatment: 33 Information Obtained From Patient Respiratory Medical History: Positive for: Sleep Apnea Cardiovascular Medical History: Positive for: Arrhythmia - [name]-Fib; Coronary Artery Disease - s/p CABG; Deep Vein Thrombosis; Hypertension; Myocardial Infarction; Peripheral Arterial Disease - s/p Fempop x 2 Past Medical History Notes: CVA x 3 Endocrine Medical History: Positive for: Type II Diabetes Time with diabetes: since 1997 Treated with: Insulin, Oral agents Blood sugar tested every day: Yes Tested : 2-3x per day Musculoskeletal Medical History: Past Medical History Notes: carpal tunnel syndrome Neurologic Medical History:  directed. Patient Medications llergies: No Known Drug Allergies [name] Notifications Medication Indication Start End 04/20/2023 lidocaine DOSE topical 4 % cream - cream topical Electronic  Signature(s) Signed: 04/20/2023 9:58:14 AM By: Duanne Guess MD FACS Signed: 04/20/2023 3:42:47 PM By: Samuella Bruin Entered By: Samuella Bruin on 04/20/2023 09:42:30 -------------------------------------------------------------------------------- Problem List Details Patient Name: Date of Service: [name] Pian, [name]. 04/20/2023 9:15 [name] Medical Record Number: [id number] Patient Account Number: [id number] Date of Birth/Sex: Treating RN: Apr 05, 1956 (67 y.o. M) Primary Care Provider: Arva Chafe Other Clinician: Referring Provider: Treating Provider/Extender: [name] in Treatment: [address] [place] [name] ([id number]) [id number] Page 5 of 11 Active Problems ICD-10 Encounter Code Description Active Date MDM Diagnosis L97.512 Non-pressure chronic ulcer of other part of right foot with fat layer exposed 08/31/2022 No Yes I73.9 Peripheral vascular disease, unspecified 08/31/2022 No Yes I25.10 Atherosclerotic heart disease of native coronary artery without angina pectoris 08/31/2022 No Yes I10 Essential (primary) hypertension 08/31/2022 No Yes E11.65 Type 2 diabetes mellitus with hyperglycemia 08/31/2022 No Yes E11.40 Type 2 diabetes mellitus with diabetic neuropathy, unspecified 08/31/2022 No Yes I63.9 Cerebral infarction, unspecified 08/31/2022 No Yes Inactive Problems Resolved Problems ICD-10 Code Description Active Date Resolved Date L97.821 Non-pressure chronic ulcer of other part of left lower leg limited to breakdown of skin 11/05/2022 11/05/2022 Electronic Signature(s) Signed: 04/20/2023 9:37:15 AM By: Duanne Guess MD FACS Entered By: Duanne Guess on 04/20/2023 09:37:15 -------------------------------------------------------------------------------- Progress Note Details Patient Name: Date of Service: [name] Pian, [name]. 04/20/2023 9:15 [name] Medical Record Number: [id number] Patient Account Number: [id number] Date of  Birth/Sex: Treating RN: Oct 02, 1955 (67 y.o. M) Primary Care Provider: Arva Chafe Other Clinician: Referring Provider: Treating Provider/Extender: [name] in Treatment: 33 Subjective Chief Complaint Information obtained from Patient 05/14/2020; patient is here for review of an abrasion injury on the left lateral calf 08/31/2022: [name] (1st metatarsal base, plantar) History of Present Illness (HPI) [name], [name] ([id number]) [id number] Page 6 of 11 ADMISSION 05/14/2020; this is [name] with multiple medical issues. Predominantly he has type 2 diabetes with [name] and also history of fairly significant PAD. He had [name] in February 2017 he also had an atherectomy and angioplasty by Dr. [name] of the right popliteal artery in 2016. He is supposed to be getting arterial studies annually however this was interrupted last year because of the pandemic. He tells [place] he was at [facility] 2 weeks ago was getting out of of the scooter and traumatized his left lateral lower leg. There was [name] as the patient is on Plavix and Eliquis. They have been dressing this with Neosporin and doing [name] fairly good job. Wound measures 2.5 x 3.5 it does not have any depth he does not have [name] in his legs outside of surgery however he does have chronic edema and skin changes suggestive of chronic venous disease possibly some degree of lymphedema as well. Past medical history includes type 2 diabetes with peripheral neuropathy and gait instability, lumbar spondylosis, obstructive sleep apnea, history of [name], basal cell skin cancer, atrial fibrillation on anticoagulation, significant PAD as noted with [name] and [name] by  Dr. [name] in 30th 2016. He also has [name] of coronary artery disease with an MI in 2002 hypertension hyperlipidemia and heart failure with  week. - Dr. Lady Gary - room 2 TCC Anesthetic: (In clinic) Topical Lidocaine 4% applied to wound bed Bathing/ Shower/ Hygiene: May shower with protection but do not get wound dressing(s) wet. Protect dressing(s) with water repellant cover (for example, large plastic bag) or [name] cast cover and  may then take shower. Edema Control - Lymphedema / SCD / Other: Elevate legs to the level of the heart or above for 30 minutes daily and/or when sitting for 3-4 times [name] day throughout the day. Avoid standing for long periods of time. Patient to wear own compression stockings every day. Moisturize legs daily. Off-Loading: T Contact Cast to Right Lower Extremity otal Removable cast walker boot to: - right leg The following medication(s) was prescribed: lidocaine topical 4 % cream cream topical was prescribed at facility WOUND #2: - Foot Wound Laterality: Plantar, Right Cleanser: Soap and Water 1 x Per Week/30 Days Discharge Instructions: May shower and wash wound with dial antibacterial soap and water prior to dressing change. Cleanser: Wound Cleanser 1 x Per Week/30 Days Discharge Instructions: Cleanse the wound with wound cleanser prior to applying [name] clean dressing using gauze sponges, not tissue or cotton balls. Peri-Wound Care: Zinc Oxide Ointment 30g tube 1 x Per Week/30 Days Discharge Instructions: Apply Zinc Oxide to periwound with each dressing change Topical: Gentamicin 1 x Per Week/30 Days Discharge Instructions: As directed by physician Topical: Mupirocin Ointment 1 x Per Week/30 Days Discharge Instructions: Apply Mupirocin (Bactroban) as instructed Prim Dressing: Maxorb Extra Ag+ Alginate Dressing, 2x2 (in/in) 1 x Per Week/30 Days ary Discharge Instructions: Apply to wound bed as instructed Secondary Dressing: Woven Gauze Sponge, Non-Sterile 4x4 in 1 x Per Week/30 Days Discharge Instructions: Apply over primary dressing as directed. Secondary Dressing: Zetuvit Plus 4x8 in 1 x Per Week/30 Days Discharge Instructions: Apply over primary dressing as directed. Secured With: [employer], 4.5x3.1 (in/yd) 1 x Per Week/30 Days Discharge Instructions: Secure with Kerlix as directed. Secured With: 62M Medipore H Soft Cloth Surgical T ape, 4 x 10 (in/yd) 1 x Per Week/30  Days Discharge Instructions: Secure with tape as directed. 04/20/2023: The wound is smaller again today. The periwound skin is nice and dry and there is no concern for infection. The wound bed was prepared for total contact casting. Topical gentamicin and mupirocin were applied, followed by silver alginate. The total contact cast was then applied in standard fashion. Follow-up in 1 week. [name], [name] ([id number]) [id number] Page 9 of 11 Electronic Signature(s) Signed: 04/20/2023 9:58:14 AM By: Duanne Guess MD FACS Signed: 04/20/2023 3:42:47 PM By: Samuella Bruin Previous Signature: 04/20/2023 9:40:29 AM Version By: Duanne Guess MD FACS Entered By: Samuella Bruin on 04/20/2023 09:42:45 -------------------------------------------------------------------------------- HxROS Details Patient Name: Date of Service: [name] Pian, [name]. 04/20/2023 9:15 [name] Medical Record Number: [id number] Patient Account Number: [id number] Date of Birth/Sex: Treating RN: [birth year] (67 y.o. M) Primary Care Provider: Arva Chafe Other Clinician: Referring Provider: Treating Provider/Extender: [name] in Treatment: 33 Information Obtained From Patient Respiratory Medical History: Positive for: Sleep Apnea Cardiovascular Medical History: Positive for: Arrhythmia - [name]-Fib; Coronary Artery Disease - s/p CABG; Deep Vein Thrombosis; Hypertension; Myocardial Infarction; Peripheral Arterial Disease - s/p Fempop x 2 Past Medical History Notes: CVA x 3 Endocrine Medical History: Positive for: Type II Diabetes Time with diabetes: since 1997 Treated with: Insulin, Oral agents Blood sugar tested every day: Yes Tested : 2-3x per day Musculoskeletal Medical History: Past Medical History Notes: carpal tunnel syndrome Neurologic Medical History:  week. - Dr. Lady Gary - room 2 TCC Anesthetic: (In clinic) Topical Lidocaine 4% applied to wound bed Bathing/ Shower/ Hygiene: May shower with protection but do not get wound dressing(s) wet. Protect dressing(s) with water repellant cover (for example, large plastic bag) or [name] cast cover and  may then take shower. Edema Control - Lymphedema / SCD / Other: Elevate legs to the level of the heart or above for 30 minutes daily and/or when sitting for 3-4 times [name] day throughout the day. Avoid standing for long periods of time. Patient to wear own compression stockings every day. Moisturize legs daily. Off-Loading: T Contact Cast to Right Lower Extremity otal Removable cast walker boot to: - right leg The following medication(s) was prescribed: lidocaine topical 4 % cream cream topical was prescribed at facility WOUND #2: - Foot Wound Laterality: Plantar, Right Cleanser: Soap and Water 1 x Per Week/30 Days Discharge Instructions: May shower and wash wound with dial antibacterial soap and water prior to dressing change. Cleanser: Wound Cleanser 1 x Per Week/30 Days Discharge Instructions: Cleanse the wound with wound cleanser prior to applying [name] clean dressing using gauze sponges, not tissue or cotton balls. Peri-Wound Care: Zinc Oxide Ointment 30g tube 1 x Per Week/30 Days Discharge Instructions: Apply Zinc Oxide to periwound with each dressing change Topical: Gentamicin 1 x Per Week/30 Days Discharge Instructions: As directed by physician Topical: Mupirocin Ointment 1 x Per Week/30 Days Discharge Instructions: Apply Mupirocin (Bactroban) as instructed Prim Dressing: Maxorb Extra Ag+ Alginate Dressing, 2x2 (in/in) 1 x Per Week/30 Days ary Discharge Instructions: Apply to wound bed as instructed Secondary Dressing: Woven Gauze Sponge, Non-Sterile 4x4 in 1 x Per Week/30 Days Discharge Instructions: Apply over primary dressing as directed. Secondary Dressing: Zetuvit Plus 4x8 in 1 x Per Week/30 Days Discharge Instructions: Apply over primary dressing as directed. Secured With: [employer], 4.5x3.1 (in/yd) 1 x Per Week/30 Days Discharge Instructions: Secure with Kerlix as directed. Secured With: 62M Medipore H Soft Cloth Surgical T ape, 4 x 10 (in/yd) 1 x Per Week/30  Days Discharge Instructions: Secure with tape as directed. 04/20/2023: The wound is smaller again today. The periwound skin is nice and dry and there is no concern for infection. The wound bed was prepared for total contact casting. Topical gentamicin and mupirocin were applied, followed by silver alginate. The total contact cast was then applied in standard fashion. Follow-up in 1 week. [name], [name] ([id number]) [id number] Page 9 of 11 Electronic Signature(s) Signed: 04/20/2023 9:58:14 AM By: Duanne Guess MD FACS Signed: 04/20/2023 3:42:47 PM By: Samuella Bruin Previous Signature: 04/20/2023 9:40:29 AM Version By: Duanne Guess MD FACS Entered By: Samuella Bruin on 04/20/2023 09:42:45 -------------------------------------------------------------------------------- HxROS Details Patient Name: Date of Service: [name] Pian, [name]. 04/20/2023 9:15 [name] Medical Record Number: [id number] Patient Account Number: [id number] Date of Birth/Sex: Treating RN: [birth year] (67 y.o. M) Primary Care Provider: Arva Chafe Other Clinician: Referring Provider: Treating Provider/Extender: [name] in Treatment: 33 Information Obtained From Patient Respiratory Medical History: Positive for: Sleep Apnea Cardiovascular Medical History: Positive for: Arrhythmia - [name]-Fib; Coronary Artery Disease - s/p CABG; Deep Vein Thrombosis; Hypertension; Myocardial Infarction; Peripheral Arterial Disease - s/p Fempop x 2 Past Medical History Notes: CVA x 3 Endocrine Medical History: Positive for: Type II Diabetes Time with diabetes: since 1997 Treated with: Insulin, Oral agents Blood sugar tested every day: Yes Tested : 2-3x per day Musculoskeletal Medical History: Past Medical History Notes: carpal tunnel syndrome Neurologic Medical History:  [name], [name] ([id number]) [id number] Page 1 of 11 Visit Report for 04/20/2023 Chief Complaint Document Details Patient Name: Date of Service: Akeel, [name]. 04/20/2023 9:15 [name] Medical Record Number: [id number] Patient Account Number: [id number] Date of Birth/Sex: Treating RN: [birth year] (67 y.o. M) Primary Care Provider: Arva Chafe Other Clinician: Referring Provider: Treating Provider/Extender: [name] in Treatment: 33 Information Obtained from: Patient Chief Complaint 05/14/2020; patient is here for review of an abrasion injury on the left lateral calf 08/31/2022: [name] (1st metatarsal base, plantar) Electronic Signature(s) Signed: 04/20/2023 9:37:35 AM By: Duanne Guess MD FACS Entered By: Duanne Guess on 04/20/2023 09:37:35 -------------------------------------------------------------------------------- HPI Details Patient Name: Date of Service: [name] Pian, [name]. 04/20/2023 9:15 [name] Medical Record Number: [id number] Patient Account Number: [id number] Date of Birth/Sex: Treating RN: 04-18-1956 (67 y.o. M) Primary Care Provider: Arva Chafe Other Clinician: Referring Provider: Treating Provider/Extender: [name] in Treatment: 33 History of Present Illness HPI Description: ADMISSION 05/14/2020; this is [name] with multiple medical issues. Predominantly he has type 2 diabetes with [name] and also history of fairly significant PAD. He had [name] in February 2017 he also had an atherectomy and angioplasty by Dr. [name] of the right popliteal artery in 2016. He is supposed to be getting arterial studies annually however this was interrupted last year because of the pandemic. He tells [place] he was at [facility] 2 weeks ago was getting out of of the scooter and traumatized his left  lateral lower leg. There was [name] as the patient is on Plavix and Eliquis. They have been dressing this with Neosporin and doing [name] fairly good job. Wound measures 2.5 x 3.5 it does not have any depth he does not have [name] in his legs outside of surgery however he does have chronic edema and skin changes suggestive of chronic venous disease possibly some degree of lymphedema as well. Past medical history includes type 2 diabetes with peripheral neuropathy and gait instability, lumbar spondylosis, obstructive sleep apnea, history of [name], basal cell skin cancer, atrial fibrillation on anticoagulation, significant PAD as noted with [name] and [name] in 30th 2016. He also has [name] of coronary artery disease with an MI in 2002 hypertension hyperlipidemia and heart failure with preserved ejection fraction His last arterial studies I can see in epic were on 03/10/2018 this showed [name] and [name] with monophasic waveforms on the right. On the left his ABI was 1.20 with [name] and triphasic waveforms. He has not had arterial studies since. Our nurse in the clinic got an ABI on the left of 1.1 11/2; left anterior leg wound in the setting of chronic venous insufficiency. Wound was initially trauma. We have been using Hydrofera Blue under compression he has home health.. The wound looks [name] lot better today with improvement in surface area 11/16; left anterior leg wound in the setting of chronic venous insufficiency. Wound was initially trauma. We have been using Hydrofera Blue under compression. The patient is closed today. He is supposed to follow-up with vein and vascular with regards to arterial insufficiency nevertheless his leg wounds are 12/3; apparently 2 weeks ago when they were putting on their stockings they managed  Positive for: Neuropathy Past Medical History Notes: stroke Oncologic Medical History: Past  Medical History Notes: skin cancer Immunizations Pneumococcal Vaccine: Received Pneumococcal Vaccination: Yes Received Pneumococcal Vaccination On or After 60th Birthday: Yes Implantable Devices None Hospitalization / Surgery History Type of Hospitalization/Surgery [name], [name] ([id number]) [id number] Page 10 of 11 colonoscopy polypectomy peripheral vascular cath shoulder arthroscopy carpal tunnel release coronary artery bypass appendectomy cardiac cath coronary angioplasty thrombectomy knee arthroplasty popliteal artery stent Family and Social History Unknown History: Yes; Never smoker; Marital Status - Single; Alcohol Use: Never; Drug Use: No History; Caffeine Use: Rarely; Financial Concerns: No; Food, Clothing or Shelter Needs: No; Support System Lacking: No; Transportation Concerns: No Electronic Signature(s) Signed: 04/20/2023 9:58:14 AM By: Duanne Guess MD FACS Entered By: Duanne Guess on 04/20/2023 09:38:14 -------------------------------------------------------------------------------- Total Contact Cast Details Patient Name: Date of Service: [name] Sabal [name]. 04/20/2023 9:15 [name] Medical Record Number: [id number] Patient Account Number: [id number] Date of Birth/Sex: Treating RN: [birth year] (67 y.o. [name] Primary Care Provider: Arva Chafe Other Clinician: Referring Provider: Treating Provider/Extender: [name] in Treatment: 33 T Contact Cast Applied for Wound Assessment: otal Wound #2 Right,Plantar Foot Performed By: Physician Duanne Guess, MD The following information was scribed by: Samuella Bruin The information was scribed for: Duanne Guess Post Procedure Diagnosis Same as Pre-procedure Electronic Signature(s) Signed: 04/20/2023 9:58:14 AM By: Duanne Guess MD FACS Signed: 04/20/2023 3:42:47 PM By: Samuella Bruin Entered By: Samuella Bruin on  04/20/2023 09:20:09 -------------------------------------------------------------------------------- SuperBill Details Patient Name: Date of Service: [name] Pian, [name]. 04/20/2023 Medical Record Number: [id number] Patient Account Number: [id number] Date of Birth/Sex: Treating RN: 1956/03/05 (67 y.o. M) Primary Care Provider: Arva Chafe Other Clinician: Referring Provider: Treating Provider/Extender: [name] in Treatment: 33 Diagnosis Coding ICD-10 Codes Code Description [phone] Non-pressure chronic ulcer of other part of right foot with fat layer exposed I73.9 Peripheral vascular disease, unspecified [name], [name] ([id number]) [phone].pdf Page 11 of 11 I25.10 Atherosclerotic heart disease of native coronary artery without angina pectoris I10 Essential (primary) hypertension E11.65 Type 2 diabetes mellitus with hyperglycemia E11.40 Type 2 diabetes mellitus with diabetic neuropathy, unspecified I63.9 Cerebral infarction, unspecified Facility Procedures : CPT4 Code: [id number] Description: 62376 - APPLY TOTAL CONTACT LEG CAST ICD-10 Diagnosis Description L97.512 Non-pressure chronic ulcer of other part of right foot with fat layer exposed Modifier: Quantity: 1 Physician Procedures : CPT4 Code Description Modifier [id number] - WC PHYS LEVEL 4 - EST PT 25 ICD-10 Diagnosis Description L97.512 Non-pressure chronic ulcer of other part of right foot with fat layer exposed I73.9 Peripheral vascular disease, unspecified E11.65 Type 2  diabetes mellitus with hyperglycemia E11.40 Type 2 diabetes mellitus with diabetic neuropathy, unspecified Quantity: 1 : [id number] - WC PHYS APPLY TOTAL CONTACT CAST ICD-10 Diagnosis Description L97.512 Non-pressure chronic ulcer of other part of right foot with fat layer exposed Quantity: 1 Electronic Signature(s) Signed: 04/20/2023 9:40:56 AM By: Duanne Guess MD FACS Entered By: Duanne Guess on 04/20/2023 09:40:55  directed. Patient Medications llergies: No Known Drug Allergies [name] Notifications Medication Indication Start End 04/20/2023 lidocaine DOSE topical 4 % cream - cream topical Electronic  Signature(s) Signed: 04/20/2023 9:58:14 AM By: Duanne Guess MD FACS Signed: 04/20/2023 3:42:47 PM By: Samuella Bruin Entered By: Samuella Bruin on 04/20/2023 09:42:30 -------------------------------------------------------------------------------- Problem List Details Patient Name: Date of Service: [name] Pian, [name]. 04/20/2023 9:15 [name] Medical Record Number: [id number] Patient Account Number: [id number] Date of Birth/Sex: Treating RN: Apr 05, 1956 (67 y.o. M) Primary Care Provider: Arva Chafe Other Clinician: Referring Provider: Treating Provider/Extender: [name] in Treatment: [address] [place] [name] ([id number]) [id number] Page 5 of 11 Active Problems ICD-10 Encounter Code Description Active Date MDM Diagnosis L97.512 Non-pressure chronic ulcer of other part of right foot with fat layer exposed 08/31/2022 No Yes I73.9 Peripheral vascular disease, unspecified 08/31/2022 No Yes I25.10 Atherosclerotic heart disease of native coronary artery without angina pectoris 08/31/2022 No Yes I10 Essential (primary) hypertension 08/31/2022 No Yes E11.65 Type 2 diabetes mellitus with hyperglycemia 08/31/2022 No Yes E11.40 Type 2 diabetes mellitus with diabetic neuropathy, unspecified 08/31/2022 No Yes I63.9 Cerebral infarction, unspecified 08/31/2022 No Yes Inactive Problems Resolved Problems ICD-10 Code Description Active Date Resolved Date L97.821 Non-pressure chronic ulcer of other part of left lower leg limited to breakdown of skin 11/05/2022 11/05/2022 Electronic Signature(s) Signed: 04/20/2023 9:37:15 AM By: Duanne Guess MD FACS Entered By: Duanne Guess on 04/20/2023 09:37:15 -------------------------------------------------------------------------------- Progress Note Details Patient Name: Date of Service: [name] Pian, [name]. 04/20/2023 9:15 [name] Medical Record Number: [id number] Patient Account Number: [id number] Date of  Birth/Sex: Treating RN: Oct 02, 1955 (67 y.o. M) Primary Care Provider: Arva Chafe Other Clinician: Referring Provider: Treating Provider/Extender: [name] in Treatment: 33 Subjective Chief Complaint Information obtained from Patient 05/14/2020; patient is here for review of an abrasion injury on the left lateral calf 08/31/2022: [name] (1st metatarsal base, plantar) History of Present Illness (HPI) [name], [name] ([id number]) [id number] Page 6 of 11 ADMISSION 05/14/2020; this is [name] with multiple medical issues. Predominantly he has type 2 diabetes with [name] and also history of fairly significant PAD. He had [name] in February 2017 he also had an atherectomy and angioplasty by Dr. [name] of the right popliteal artery in 2016. He is supposed to be getting arterial studies annually however this was interrupted last year because of the pandemic. He tells [place] he was at [facility] 2 weeks ago was getting out of of the scooter and traumatized his left lateral lower leg. There was [name] as the patient is on Plavix and Eliquis. They have been dressing this with Neosporin and doing [name] fairly good job. Wound measures 2.5 x 3.5 it does not have any depth he does not have [name] in his legs outside of surgery however he does have chronic edema and skin changes suggestive of chronic venous disease possibly some degree of lymphedema as well. Past medical history includes type 2 diabetes with peripheral neuropathy and gait instability, lumbar spondylosis, obstructive sleep apnea, history of [name], basal cell skin cancer, atrial fibrillation on anticoagulation, significant PAD as noted with [name] and [name] by  Dr. [name] in 30th 2016. He also has [name] of coronary artery disease with an MI in 2002 hypertension hyperlipidemia and heart failure with  week. - Dr. Lady Gary - room 2 TCC Anesthetic: (In clinic) Topical Lidocaine 4% applied to wound bed Bathing/ Shower/ Hygiene: May shower with protection but do not get wound dressing(s) wet. Protect dressing(s) with water repellant cover (for example, large plastic bag) or [name] cast cover and  may then take shower. Edema Control - Lymphedema / SCD / Other: Elevate legs to the level of the heart or above for 30 minutes daily and/or when sitting for 3-4 times [name] day throughout the day. Avoid standing for long periods of time. Patient to wear own compression stockings every day. Moisturize legs daily. Off-Loading: T Contact Cast to Right Lower Extremity otal Removable cast walker boot to: - right leg The following medication(s) was prescribed: lidocaine topical 4 % cream cream topical was prescribed at facility WOUND #2: - Foot Wound Laterality: Plantar, Right Cleanser: Soap and Water 1 x Per Week/30 Days Discharge Instructions: May shower and wash wound with dial antibacterial soap and water prior to dressing change. Cleanser: Wound Cleanser 1 x Per Week/30 Days Discharge Instructions: Cleanse the wound with wound cleanser prior to applying [name] clean dressing using gauze sponges, not tissue or cotton balls. Peri-Wound Care: Zinc Oxide Ointment 30g tube 1 x Per Week/30 Days Discharge Instructions: Apply Zinc Oxide to periwound with each dressing change Topical: Gentamicin 1 x Per Week/30 Days Discharge Instructions: As directed by physician Topical: Mupirocin Ointment 1 x Per Week/30 Days Discharge Instructions: Apply Mupirocin (Bactroban) as instructed Prim Dressing: Maxorb Extra Ag+ Alginate Dressing, 2x2 (in/in) 1 x Per Week/30 Days ary Discharge Instructions: Apply to wound bed as instructed Secondary Dressing: Woven Gauze Sponge, Non-Sterile 4x4 in 1 x Per Week/30 Days Discharge Instructions: Apply over primary dressing as directed. Secondary Dressing: Zetuvit Plus 4x8 in 1 x Per Week/30 Days Discharge Instructions: Apply over primary dressing as directed. Secured With: [employer], 4.5x3.1 (in/yd) 1 x Per Week/30 Days Discharge Instructions: Secure with Kerlix as directed. Secured With: 62M Medipore H Soft Cloth Surgical T ape, 4 x 10 (in/yd) 1 x Per Week/30  Days Discharge Instructions: Secure with tape as directed. 04/20/2023: The wound is smaller again today. The periwound skin is nice and dry and there is no concern for infection. The wound bed was prepared for total contact casting. Topical gentamicin and mupirocin were applied, followed by silver alginate. The total contact cast was then applied in standard fashion. Follow-up in 1 week. [name], [name] ([id number]) [id number] Page 9 of 11 Electronic Signature(s) Signed: 04/20/2023 9:58:14 AM By: Duanne Guess MD FACS Signed: 04/20/2023 3:42:47 PM By: Samuella Bruin Previous Signature: 04/20/2023 9:40:29 AM Version By: Duanne Guess MD FACS Entered By: Samuella Bruin on 04/20/2023 09:42:45 -------------------------------------------------------------------------------- HxROS Details Patient Name: Date of Service: [name] Pian, [name]. 04/20/2023 9:15 [name] Medical Record Number: [id number] Patient Account Number: [id number] Date of Birth/Sex: Treating RN: [birth year] (67 y.o. M) Primary Care Provider: Arva Chafe Other Clinician: Referring Provider: Treating Provider/Extender: [name] in Treatment: 33 Information Obtained From Patient Respiratory Medical History: Positive for: Sleep Apnea Cardiovascular Medical History: Positive for: Arrhythmia - [name]-Fib; Coronary Artery Disease - s/p CABG; Deep Vein Thrombosis; Hypertension; Myocardial Infarction; Peripheral Arterial Disease - s/p Fempop x 2 Past Medical History Notes: CVA x 3 Endocrine Medical History: Positive for: Type II Diabetes Time with diabetes: since 1997 Treated with: Insulin, Oral agents Blood sugar tested every day: Yes Tested : 2-3x per day Musculoskeletal Medical History: Past Medical History Notes: carpal tunnel syndrome Neurologic Medical History:  [name], [name] ([id number]) [id number] Page 1 of 11 Visit Report for 04/20/2023 Chief Complaint Document Details Patient Name: Date of Service: Akeel, Reffner [name]. 04/20/2023 9:15 [name] Medical Record Number: [id number] Patient Account Number: [id number] Date of Birth/Sex: Treating RN: [birth year] (67 y.o. M) Primary Care Provider: Arva Chafe Other Clinician: Referring Provider: Treating Provider/Extender: [name] in Treatment: 33 Information Obtained from: Patient Chief Complaint 05/14/2020; patient is here for review of an abrasion injury on the left lateral calf 08/31/2022: [name] (1st metatarsal base, plantar) Electronic Signature(s) Signed: 04/20/2023 9:37:35 AM By: Duanne Guess MD FACS Entered By: Duanne Guess on 04/20/2023 09:37:35 -------------------------------------------------------------------------------- HPI Details Patient Name: Date of Service: [name] Pian, [name]. 04/20/2023 9:15 [name] Medical Record Number: [id number] Patient Account Number: [id number] Date of Birth/Sex: Treating RN: 04-18-1956 (67 y.o. M) Primary Care Provider: Arva Chafe Other Clinician: Referring Provider: Treating Provider/Extender: [name] in Treatment: 33 History of Present Illness HPI Description: ADMISSION 05/14/2020; this is [name] with multiple medical issues. Predominantly he has type 2 diabetes with [name] and also history of fairly significant PAD. He had [name] in February 2017 he also had an atherectomy and angioplasty by Dr. [name] of the right popliteal artery in 2016. He is supposed to be getting arterial studies annually however this was interrupted last year because of the pandemic. He tells [place] he was at [facility] 2 weeks ago was getting out of of the scooter and traumatized his left  lateral lower leg. There was [name] as the patient is on Plavix and Eliquis. They have been dressing this with Neosporin and doing [name] fairly good job. Wound measures 2.5 x 3.5 it does not have any depth he does not have [name] in his legs outside of surgery however he does have chronic edema and skin changes suggestive of chronic venous disease possibly some degree of lymphedema as well. Past medical history includes type 2 diabetes with peripheral neuropathy and gait instability, lumbar spondylosis, obstructive sleep apnea, history of [name], basal cell skin cancer, atrial fibrillation on anticoagulation, significant PAD as noted with [name] and [name] in 30th 2016. He also has [name] of coronary artery disease with an MI in 2002 hypertension hyperlipidemia and heart failure with preserved ejection fraction His last arterial studies I can see in epic were on 03/10/2018 this showed [name] and [name] with monophasic waveforms on the right. On the left his ABI was 1.20 with [name] and triphasic waveforms. He has not had arterial studies since. Our nurse in the clinic got an ABI on the left of 1.1 11/2; left anterior leg wound in the setting of chronic venous insufficiency. Wound was initially trauma. We have been using Hydrofera Blue under compression he has home health.. The wound looks [name] lot better today with improvement in surface area 11/16; left anterior leg wound in the setting of chronic venous insufficiency. Wound was initially trauma. We have been using Hydrofera Blue under compression. The patient is closed today. He is supposed to follow-up with vein and vascular with regards to arterial insufficiency nevertheless his leg wounds are 12/3; apparently 2 weeks ago when they were putting on their stockings they managed  Positive for: Neuropathy Past Medical History Notes: stroke Oncologic Medical History: Past  Medical History Notes: skin cancer Immunizations Pneumococcal Vaccine: Received Pneumococcal Vaccination: Yes Received Pneumococcal Vaccination On or After 60th Birthday: Yes Implantable Devices None Hospitalization / Surgery History Type of Hospitalization/Surgery [name], [name] ([id number]) [id number] Page 10 of 11 colonoscopy polypectomy peripheral vascular cath shoulder arthroscopy carpal tunnel release coronary artery bypass appendectomy cardiac cath coronary angioplasty thrombectomy knee arthroplasty popliteal artery stent Family and Social History Unknown History: Yes; Never smoker; Marital Status - Single; Alcohol Use: Never; Drug Use: No History; Caffeine Use: Rarely; Financial Concerns: No; Food, Clothing or Shelter Needs: No; Support System Lacking: No; Transportation Concerns: No Electronic Signature(s) Signed: 04/20/2023 9:58:14 AM By: Duanne Guess MD FACS Entered By: Duanne Guess on 04/20/2023 09:38:14 -------------------------------------------------------------------------------- Total Contact Cast Details Patient Name: Date of Service: [name] Sabal [name]. 04/20/2023 9:15 [name] Medical Record Number: [id number] Patient Account Number: [id number] Date of Birth/Sex: Treating RN: [birth year] (67 y.o. [name] Primary Care Provider: Arva Chafe Other Clinician: Referring Provider: Treating Provider/Extender: [name] in Treatment: 33 T Contact Cast Applied for Wound Assessment: otal Wound #2 Right,Plantar Foot Performed By: Physician Duanne Guess, MD The following information was scribed by: Samuella Bruin The information was scribed for: Duanne Guess Post Procedure Diagnosis Same as Pre-procedure Electronic Signature(s) Signed: 04/20/2023 9:58:14 AM By: Duanne Guess MD FACS Signed: 04/20/2023 3:42:47 PM By: Samuella Bruin Entered By: Samuella Bruin on  04/20/2023 09:20:09 -------------------------------------------------------------------------------- SuperBill Details Patient Name: Date of Service: [name] Pian, [name]. 04/20/2023 Medical Record Number: [id number] Patient Account Number: [id number] Date of Birth/Sex: Treating RN: 1956/03/05 (67 y.o. M) Primary Care Provider: Arva Chafe Other Clinician: Referring Provider: Treating Provider/Extender: [name] in Treatment: 33 Diagnosis Coding ICD-10 Codes Code Description [phone] Non-pressure chronic ulcer of other part of right foot with fat layer exposed I73.9 Peripheral vascular disease, unspecified [name], [name] ([id number]) [phone].pdf Page 11 of 11 I25.10 Atherosclerotic heart disease of native coronary artery without angina pectoris I10 Essential (primary) hypertension E11.65 Type 2 diabetes mellitus with hyperglycemia E11.40 Type 2 diabetes mellitus with diabetic neuropathy, unspecified I63.9 Cerebral infarction, unspecified Facility Procedures : CPT4 Code: [id number] Description: 62376 - APPLY TOTAL CONTACT LEG CAST ICD-10 Diagnosis Description L97.512 Non-pressure chronic ulcer of other part of right foot with fat layer exposed Modifier: Quantity: 1 Physician Procedures : CPT4 Code Description Modifier [id number] 99214 - WC PHYS LEVEL 4 - EST PT 25 ICD-10 Diagnosis Description L97.512 Non-pressure chronic ulcer of other part of right foot with fat layer exposed I73.9 Peripheral vascular disease, unspecified E11.65 Type 2  diabetes mellitus with hyperglycemia E11.40 Type 2 diabetes mellitus with diabetic neuropathy, unspecified Quantity: 1 : [id number] 10626 - WC PHYS APPLY TOTAL CONTACT CAST ICD-10 Diagnosis Description L97.512 Non-pressure chronic ulcer of other part of right foot with fat layer exposed Quantity: 1 Electronic Signature(s) Signed: 04/20/2023 9:40:56 AM By: Duanne Guess MD FACS Entered By: Duanne Guess on 04/20/2023 09:40:55

## 2023-04-21 NOTE — Anesthesia Preprocedure Evaluation (Signed)
Anesthesia Evaluation  Patient identified by MRN, date of birth, ID band Patient awake    Reviewed: Allergy & Precautions, NPO status , Patient's Chart, lab work & pertinent test results  Airway Mallampati: III  TM Distance: >3 FB Neck ROM: Full    Dental no notable dental hx. (+) Missing   Pulmonary sleep apnea and Continuous Positive Airway Pressure Ventilation    Pulmonary exam normal breath sounds clear to auscultation       Cardiovascular hypertension, + CAD, + Past MI, + Peripheral Vascular Disease and + DVT (hx)  Normal cardiovascular exam Rhythm:Regular Rate:Normal     Neuro/Psych CVA (weaker on LLE >R), Residual Symptoms    GI/Hepatic   Endo/Other  diabetes, Type 2  On GLP-1 last dose  Renal/GU      Musculoskeletal   Abdominal  (+) + obese (BMI 39.9)  Peds  Hematology On plavix   Anesthesia Other Findings   Reproductive/Obstetrics                             Anesthesia Physical Anesthesia Plan  ASA: 3  Anesthesia Plan: MAC   Post-op Pain Management: Minimal or no pain anticipated   Induction:   PONV Risk Score and Plan: Treatment may vary due to age or medical condition and Propofol infusion  Airway Management Planned: Natural Airway and Nasal Cannula  Additional Equipment: None  Intra-op Plan:   Post-operative Plan:   Informed Consent: I have reviewed the patients History and Physical, chart, labs and discussed the procedure including the risks, benefits and alternatives for the proposed anesthesia with the patient or authorized representative who has indicated his/her understanding and acceptance.     Dental advisory given  Plan Discussed with: CRNA  Anesthesia Plan Comments: (Hx of adenomatous polyps for colonoscopy)        Anesthesia Quick Evaluation

## 2023-04-22 ENCOUNTER — Encounter (HOSPITAL_COMMUNITY): Payer: Self-pay | Admitting: Gastroenterology

## 2023-04-22 ENCOUNTER — Encounter (HOSPITAL_COMMUNITY)
Admission: RE | Disposition: A | Discharge status: Home / Self Care | Payer: Self-pay | Source: Home / Self Care | Attending: Gastroenterology

## 2023-04-22 ENCOUNTER — Other Ambulatory Visit: Payer: Self-pay

## 2023-04-22 ENCOUNTER — Ambulatory Visit (HOSPITAL_COMMUNITY): Payer: Self-pay | Admitting: Certified Registered"

## 2023-04-22 ENCOUNTER — Ambulatory Visit (HOSPITAL_COMMUNITY)
Admission: RE | Admit: 2023-04-22 | Discharge: 2023-04-22 | Disposition: A | Discharge status: Home / Self Care | Payer: Medicare HMO | Attending: Gastroenterology | Admitting: Gastroenterology

## 2023-04-22 ENCOUNTER — Ambulatory Visit (HOSPITAL_BASED_OUTPATIENT_CLINIC_OR_DEPARTMENT_OTHER): Payer: Self-pay | Admitting: Certified Registered"

## 2023-04-22 DIAGNOSIS — Z09 Encounter for follow-up examination after completed treatment for conditions other than malignant neoplasm: Secondary | ICD-10-CM | POA: Diagnosis not present

## 2023-04-22 DIAGNOSIS — Z9049 Acquired absence of other specified parts of digestive tract: Secondary | ICD-10-CM | POA: Insufficient documentation

## 2023-04-22 DIAGNOSIS — D122 Benign neoplasm of ascending colon: Secondary | ICD-10-CM | POA: Diagnosis not present

## 2023-04-22 DIAGNOSIS — Z9889 Other specified postprocedural states: Secondary | ICD-10-CM | POA: Insufficient documentation

## 2023-04-22 DIAGNOSIS — G4733 Obstructive sleep apnea (adult) (pediatric): Secondary | ICD-10-CM | POA: Diagnosis not present

## 2023-04-22 DIAGNOSIS — Z8673 Personal history of transient ischemic attack (TIA), and cerebral infarction without residual deficits: Secondary | ICD-10-CM | POA: Diagnosis not present

## 2023-04-22 DIAGNOSIS — K648 Other hemorrhoids: Secondary | ICD-10-CM | POA: Diagnosis not present

## 2023-04-22 DIAGNOSIS — Z860101 Personal history of adenomatous and serrated colon polyps: Secondary | ICD-10-CM | POA: Diagnosis not present

## 2023-04-22 DIAGNOSIS — Z6839 Body mass index (BMI) 39.0-39.9, adult: Secondary | ICD-10-CM | POA: Diagnosis not present

## 2023-04-22 DIAGNOSIS — I10 Essential (primary) hypertension: Secondary | ICD-10-CM | POA: Insufficient documentation

## 2023-04-22 DIAGNOSIS — Z1211 Encounter for screening for malignant neoplasm of colon: Secondary | ICD-10-CM | POA: Diagnosis not present

## 2023-04-22 DIAGNOSIS — Z7901 Long term (current) use of anticoagulants: Secondary | ICD-10-CM | POA: Insufficient documentation

## 2023-04-22 DIAGNOSIS — E119 Type 2 diabetes mellitus without complications: Secondary | ICD-10-CM

## 2023-04-22 DIAGNOSIS — K621 Rectal polyp: Secondary | ICD-10-CM | POA: Diagnosis not present

## 2023-04-22 DIAGNOSIS — K635 Polyp of colon: Secondary | ICD-10-CM

## 2023-04-22 DIAGNOSIS — K5140 Inflammatory polyps of colon without complications: Secondary | ICD-10-CM | POA: Diagnosis not present

## 2023-04-22 DIAGNOSIS — D128 Benign neoplasm of rectum: Secondary | ICD-10-CM

## 2023-04-22 DIAGNOSIS — D120 Benign neoplasm of cecum: Secondary | ICD-10-CM | POA: Diagnosis not present

## 2023-04-22 DIAGNOSIS — Z86718 Personal history of other venous thrombosis and embolism: Secondary | ICD-10-CM | POA: Diagnosis not present

## 2023-04-22 DIAGNOSIS — Z7985 Long-term (current) use of injectable non-insulin antidiabetic drugs: Secondary | ICD-10-CM | POA: Insufficient documentation

## 2023-04-22 DIAGNOSIS — Q438 Other specified congenital malformations of intestine: Secondary | ICD-10-CM | POA: Insufficient documentation

## 2023-04-22 DIAGNOSIS — I2510 Atherosclerotic heart disease of native coronary artery without angina pectoris: Secondary | ICD-10-CM | POA: Insufficient documentation

## 2023-04-22 DIAGNOSIS — Z951 Presence of aortocoronary bypass graft: Secondary | ICD-10-CM | POA: Diagnosis not present

## 2023-04-22 DIAGNOSIS — D123 Benign neoplasm of transverse colon: Secondary | ICD-10-CM | POA: Insufficient documentation

## 2023-04-22 DIAGNOSIS — E1151 Type 2 diabetes mellitus with diabetic peripheral angiopathy without gangrene: Secondary | ICD-10-CM | POA: Insufficient documentation

## 2023-04-22 DIAGNOSIS — E669 Obesity, unspecified: Secondary | ICD-10-CM | POA: Insufficient documentation

## 2023-04-22 HISTORY — PX: COLONOSCOPY WITH PROPOFOL: SHX5780

## 2023-04-22 HISTORY — PX: POLYPECTOMY: SHX5525

## 2023-04-22 HISTORY — PX: HEMOSTASIS CLIP PLACEMENT: SHX6857

## 2023-04-22 LAB — GLUCOSE, CAPILLARY
Glucose-Capillary: 82 mg/dL (ref 70–99)
Glucose-Capillary: 53 mg/dL — ABNORMAL LOW (ref 70–99)
Glucose-Capillary: 94 mg/dL (ref 70–99)

## 2023-04-22 SURGERY — COLONOSCOPY WITH PROPOFOL
Anesthesia: Monitor Anesthesia Care

## 2023-04-22 MED ORDER — PROPOFOL 10 MG/ML IV BOLUS
INTRAVENOUS | Status: DC | PRN
  Administered 2023-04-22: 20 mg via INTRAVENOUS

## 2023-04-22 MED ORDER — LIDOCAINE 2% (20 MG/ML) 5 ML SYRINGE
INTRAMUSCULAR | Status: DC | PRN
  Administered 2023-04-22: 40 mg via INTRAVENOUS
  Administered 2023-04-22: 60 mg via INTRAVENOUS

## 2023-04-22 MED ORDER — LACTATED RINGERS IV SOLN: INTRAVENOUS | Status: DC

## 2023-04-22 MED ORDER — SODIUM CHLORIDE 0.9 % IV SOLN: INTRAVENOUS | Status: DC

## 2023-04-22 MED ORDER — PROPOFOL 1000 MG/100ML IV EMUL
INTRAVENOUS | Status: AC
  Filled 2023-04-22: qty 100

## 2023-04-22 MED ORDER — PROPOFOL 500 MG/50ML IV EMUL
INTRAVENOUS | Status: DC | PRN
  Administered 2023-04-22: 125 ug/kg/min via INTRAVENOUS

## 2023-04-22 SURGICAL SUPPLY — 22 items

## 2023-04-22 NOTE — Anesthesia Postprocedure Evaluation (Signed)
Anesthesia Post Note  Patient: Eriel Doyon Covault  Procedure(s) Performed: COLONOSCOPY WITH PROPOFOL POLYPECTOMY HEMOSTASIS CLIP PLACEMENT     Patient location during evaluation: Endoscopy Anesthesia Type: MAC Level of consciousness: awake and alert Pain management: pain level controlled Vital Signs Assessment: post-procedure vital signs reviewed and stable Respiratory status: spontaneous breathing, nonlabored ventilation, respiratory function stable and patient connected to nasal cannula oxygen Cardiovascular status: blood pressure returned to baseline and stable Postop Assessment: no apparent nausea or vomiting Anesthetic complications: no   No notable events documented.  Last Vitals:  Vitals:   04/22/23 0850 04/22/23 0900  BP: (!) 138/39 (!) 146/56  Pulse: 79 68  Resp: 10 19  Temp:    SpO2: 96% 95%    Last Pain:  Vitals:   04/22/23 0900  TempSrc:   PainSc: 0-No pain                 Trevor Iha

## 2023-04-22 NOTE — Anesthesia Procedure Notes (Signed)
Procedure Name: MAC Date/Time: 04/22/2023 7:47 AM  Performed by: Sindy Guadeloupe, CRNAPre-anesthesia Checklist: Patient identified, Emergency Drugs available, Suction available, Patient being monitored and Timeout performed Oxygen Delivery Method: Simple face mask Placement Confirmation: positive ETCO2

## 2023-04-22 NOTE — Interval H&P Note (Signed)
History and Physical Interval Note:  04/22/2023 7:34 AM  [name]  has presented today for surgery, with the diagnosis of hx of adenomatous polyps.  The various methods of treatment have been discussed with the patient and family. After consideration of risks, benefits and other options for treatment, the patient has consented to  Procedure(s): COLONOSCOPY WITH PROPOFOL (N/A) as a surgical intervention.  The patient's history has been reviewed, patient examined, no change in status, stable for surgery.  I have reviewed the patient's chart and labs.  Questions were answered to the patient's satisfaction.     Charlie Pitter III
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
## 2023-04-22 NOTE — Discharge Instructions (Addendum)
Resume your Eliquis and Plavix at usual doses the day after tomorrow (04/24/23)  __________________________________________   YOU HAD AN ENDOSCOPIC PROCEDURE TODAY: Refer to the procedure report and other information in the discharge instructions given to you for any specific questions about what was found during the examination. If this information does not answer your questions, please call [facility] office at [phone] to clarify.   YOU SHOULD EXPECT: Some feelings of bloating in the abdomen. Passage of more gas than usual. Walking can help get rid of the air that was put into your GI tract during the procedure and reduce the bloating. If you had a lower endoscopy (such as a colonoscopy or flexible sigmoidoscopy) you may notice spotting of blood in your stool or on the toilet paper. Some abdominal soreness may be present for a day or two, also.  DIET: Your first meal following the procedure should be a light meal and then it is ok to progress to your normal diet. A half-sandwich or bowl of soup is an example of a good first meal. Heavy or fried foods are harder to digest and may make you feel nauseous or bloated. Drink plenty of fluids but you should avoid alcoholic beverages for 24 hours. If you had a esophageal dilation, please see attached instructions for diet.    ACTIVITY: Your care partner should take you home directly after the procedure. You should plan to take it easy, moving slowly for the rest of the day. You can resume normal activity the day after the procedure however YOU SHOULD NOT DRIVE, use power tools, machinery or perform tasks that involve climbing or major physical exertion for 24 hours (because of the sedation medicines used during the test).   SYMPTOMS TO REPORT IMMEDIATELY: A gastroenterologist can be reached at any hour. Please call [phone]  for any of the following symptoms:  Following lower endoscopy (colonoscopy, flexible sigmoidoscopy) Excessive amounts of blood  in the stool  Significant tenderness, worsening of abdominal pains  Swelling of the abdomen that is new, acute  Fever of 100 or higher  Following upper endoscopy (EGD, EUS, ERCP, esophageal dilation) Vomiting of blood or coffee ground material  New, significant abdominal pain  New, significant chest pain or pain under the shoulder blades  Painful or persistently difficult swallowing  New shortness of breath  Black, tarry-looking or red, bloody stools  FOLLOW UP:  If any biopsies were taken you will be contacted by phone or by letter within the next 1-3 weeks. Call [phone]  if you have not heard about the biopsies in 3 weeks.  Please also call with any specific questions about appointments or follow up tests.

## 2023-04-22 NOTE — Progress Notes (Signed)
Patient CBG at 53.  Dr. Myrtie Neither informed.  Patient given soda to elevate sugar per doctor orders.

## 2023-04-22 NOTE — Transfer of Care (Signed)
Immediate Anesthesia Transfer of Care Note  Patient: [name]  Procedure(s) Performed: COLONOSCOPY WITH PROPOFOL POLYPECTOMY HEMOSTASIS CLIP PLACEMENT  Patient Location: PACU and Endoscopy Unit  Anesthesia Type:MAC  Level of Consciousness: drowsy and responds to stimulation  Airway & Oxygen Therapy: Patient Spontanous Breathing and Patient connected to face mask oxygen  Post-op Assessment: Report given to RN and Post -op Vital signs reviewed and stable  Post vital signs: Reviewed and stable  Last Vitals:  Vitals Value Taken Time  BP 190/71 04/22/23 0840  Temp    Pulse 68 04/22/23 0842  Resp 25 04/22/23 0842  SpO2 100 % 04/22/23 0842  Vitals shown include unfiled device data.  Last Pain:  Vitals:   04/22/23 0718  TempSrc: Temporal  PainSc: 0-No pain         Complications: No notable events documented.

## 2023-04-22 NOTE — Op Note (Signed)
[facility] Patient Name: [name] Procedure Date: 04/22/2023 MRN: [id number] Attending MD: Starr Lake. Myrtie Neither , MD, [id number] Date of Birth: May 18, 1956 CSN: [id number] Age: 67 Admit Type: Outpatient Procedure:                Colonoscopy Indications:              High risk colon polyp surveillance: Personal                            history of multiple (3 or more) adenomas                           First screening colonoscopy October 2023 removed 19                            tubular adenomas Providers:                Sherilyn Cooter L. Myrtie Neither, MD, Jacquelyn "Jaci" Clelia Croft, RN,                            Babs Bertin CRNA, Geoffery Lyons, Technician Referring MD:              Medicines:                Monitored Anesthesia Care Complications:            No immediate complications. Estimated Blood Loss:     Estimated blood loss was minimal. Procedure:                Pre-Anesthesia Assessment:                           - Prior to the procedure, a History and Physical                            was performed, and patient medications and                            allergies were reviewed. The patient's tolerance of                            previous anesthesia was also reviewed. The risks                            and benefits of the procedure and the sedation                            options and risks were discussed with the patient.                            All questions were answered, and informed consent                            was obtained. Prior  Anticoagulants: The patient                            last took Eliquis (apixaban) 2 days and Plavix                            (clopidogrel) 5 days prior to the procedure. ASA                            Grade Assessment: III - A patient with severe                            systemic disease. After reviewing the risks and                            benefits, the patient was deemed in  satisfactory                            condition to undergo the procedure.                           After obtaining informed consent, the colonoscope                            was passed under direct vision. Throughout the                            procedure, the patient's blood pressure, pulse, and                            oxygen saturations were monitored continuously. The                            CF-HQ190L ([id number]) Olympus colonoscope was                            introduced through the anus and advanced to the the                            cecum, identified by appendiceal orifice and                            ileocecal valve. The colonoscopy was performed with                            difficulty due to a redundant colon, significant                            looping and the patient's body habitus. Successful                            completion of the procedure was aided by using  manual pressure and straightening and shortening                            the scope to obtain bowel loop reduction. The                            patient tolerated the procedure well. The quality                            of the bowel preparation was excellent. The                            ileocecal valve, appendiceal orifice, and rectum                            were photographed. Scope In: 7:56:09 AM Scope Out: 8:31:04 AM Scope Withdrawal Time: 0 hours 27 minutes 46 seconds  Total Procedure Duration: 0 hours 34 minutes 55 seconds  Findings:      The perianal and digital rectal examinations were normal.      Repeat examination of right colon under NBI performed.      The colon (entire examined portion) was redundant.      A diminutive polyp was found in the cecum. The polyp was semi-sessile.       The polyp was removed with a cold biopsy forceps. Resection and       retrieval were complete. (Jar 1)      A post polypectomy scar was found at the hepatic  flexure. There was a 6       mm residual sessile polyp tissue in the middle of the scar, removed with       cold snare polypectomy. (Jar 2)      A diminutive polyp was found in the transverse colon. The polyp was       sessile. The polyp was removed with a cold snare. Resection and       retrieval were complete. (Jar 3)      An 8-10 mm polyp was found in the distal transverse colon. The polyp was       pedunculated. The polyp was removed with a hot snare. Resection and       retrieval were complete. To prevent bleeding post-intervention, one       hemostatic clip was successfully placed (MR conditional). (Jar 4)      A diminutive polyp was found in the mid rectum. The polyp was sessile.       The polyp was removed with a cold snare. Resection and retrieval were       complete. To stop active bleeding, one hemostatic clip was successfully       placed (MR conditional). There was no bleeding at the end of the       procedure.      Internal hemorrhoids were found. The hemorrhoids were small.      The exam was otherwise without abnormality on direct and retroflexion       views. Impression:               - Redundant colon.                           -  One diminutive polyp in the cecum, removed with a                            cold biopsy forceps. Resected and retrieved.                           - Post-polypectomy scar at the hepatic flexure.                           - One diminutive polyp in the transverse colon,                            removed with a cold snare. Resected and retrieved.                           - One 10 mm polyp in the distal transverse colon,                            removed with a hot snare. Resected and retrieved.                            Clip (MR conditional) was placed.                           - One diminutive polyp in the mid rectum, removed                            with a cold snare. Resected and retrieved. Clip (MR                             conditional) was placed.                           - Internal hemorrhoids.                           - The examination was otherwise normal on direct                            and retroflexion views. Moderate Sedation:      MAC sedation used Recommendation:           - Patient has a contact number available for                            emergencies. The signs and symptoms of potential                            delayed complications were discussed with the                            patient. Return to normal activities tomorrow.                            Written discharge  instructions were provided to the                            patient.                           - Resume previous diet.                           - Resume Eliquis (apixaban) in 2 days and Plavix                            (clopidogrel) in 2 days at prior doses.                           - Await pathology results.                           - Repeat colonoscopy is recommended for                            surveillance. The colonoscopy date will be                            determined after pathology results from today's                            exam become available for review. Procedure Code(s):        --- Professional ---                           [phone], Colonoscopy, flexible; with removal of                            tumor(s), polyp(s), or other lesion(s) by snare                            technique                           45380, 59, Colonoscopy, flexible; with biopsy,                            single or multiple Diagnosis Code(s):        --- Professional ---                           Z86.010, Personal history of colonic polyps                           D12.0, Benign neoplasm of cecum                           D12.3, Benign neoplasm of transverse colon (hepatic                            flexure or splenic flexure)  D12.8, Benign neoplasm of rectum                            Z98.890, Other specified postprocedural states                           K64.8, Other hemorrhoids                           Q43.8, Other specified congenital malformations of                            intestine CPT copyright 2022 [employer]. All rights reserved. The codes documented in this report are preliminary and upon coder review may  be revised to meet current compliance requirements. Kim Lauver L. Myrtie Neither, MD 04/22/2023 8:45:13 AM This report has been signed electronically. Number of Addenda: 0

## 2023-04-26 ENCOUNTER — Encounter (HOSPITAL_COMMUNITY): Payer: Self-pay | Admitting: Gastroenterology

## 2023-04-26 ENCOUNTER — Encounter: Payer: Self-pay | Admitting: Gastroenterology

## 2023-04-26 LAB — SURGICAL PATHOLOGY

## 2023-04-27 ENCOUNTER — Encounter (HOSPITAL_BASED_OUTPATIENT_CLINIC_OR_DEPARTMENT_OTHER): Payer: Medicare HMO | Admitting: General Surgery

## 2023-04-27 DIAGNOSIS — E1165 Type 2 diabetes mellitus with hyperglycemia: Secondary | ICD-10-CM | POA: Diagnosis not present

## 2023-04-27 DIAGNOSIS — E11621 Type 2 diabetes mellitus with foot ulcer: Secondary | ICD-10-CM | POA: Diagnosis not present

## 2023-04-27 NOTE — Progress Notes (Signed)
[place], Knowledge A [id number]) [id number] Page 1 of 11 Visit Report for 04/27/2023 Chief Complaint Document Details Patient Name: Date of Service: Sheldon, Sem [name] 04/27/2023 9:15 A M Medical Record Number: [id number] Patient Account Number: [id number] Date of Birth/Sex: Treating RN: 04-13-1956 (67 y.o. M) Primary Care Provider: Arva Chafe Other Clinician: Referring Provider: Treating Provider/Extender: [name] in Treatment: 34 Information Obtained from: Patient Chief Complaint 05/14/2020; patient is here for review of an abrasion injury on the left lateral calf 08/31/2022: [name] (1st metatarsal base, plantar) Electronic Signature(s) Signed: 04/27/2023 9:21:58 AM By: Duanne Guess MD FACS Entered By: Duanne Guess on 04/27/2023 09:21:58 -------------------------------------------------------------------------------- HPI Details Patient Name: Date of Service: [name], [name] 04/27/2023 9:15 A M Medical Record Number: [id number] Patient Account Number: [id number] Date of Birth/Sex: Treating RN: [birth year] (67 y.o. M) Primary Care Provider: Arva Chafe Other Clinician: Referring Provider: Treating Provider/Extender: [name] in Treatment: 34 History of Present Illness HPI Description: ADMISSION 05/14/2020; this is a 67 year old man with multiple medical issues. Predominantly he has type 2 diabetes with a history of peripheral neuropathy and also history of fairly significant PAD. He had a left superficial femoral to posterior tibial artery bypass in February 2017 he also had an atherectomy and angioplasty by Dr. [name] of the right popliteal artery in 2016. He is supposed to be getting arterial studies annually however this was interrupted last year because of the pandemic. He tells [place] he was at [facility] 2 weeks ago was getting out of of the scooter and traumatized his left  lateral lower leg. There was a lot of bleeding as the patient is on Plavix and Eliquis. They have been dressing this with Neosporin and doing a fairly good job. Wound measures 2.5 x 3.5 it does not have any depth he does not have a wound history in his legs outside of surgery however he does have chronic edema and skin changes suggestive of chronic venous disease possibly some degree of lymphedema as well. Past medical history includes type 2 diabetes with peripheral neuropathy and gait instability, lumbar spondylosis, obstructive sleep apnea, history of a left pontine CVA, basal cell skin cancer, atrial fibrillation on anticoagulation, significant PAD as noted with a left superficial femoral to posterior tibial bypass in February 2017 and a right popliteal atherectomy and angioplasty by Dr. [name] in 30th 2016. He also has a history of coronary artery disease with an MI in 2002 hypertension hyperlipidemia and heart failure with preserved ejection fraction His last arterial studies I can see in epic were on 03/10/2018 this showed a right ABI of 0.69 and a right TBI of 0.5 with monophasic waveforms on the right. On the left his ABI was 1.20 with a TBI of 0.92 and triphasic waveforms. He has not had arterial studies since. Our nurse in the clinic got an ABI on the left of 1.1 11/2; left anterior leg wound in the setting of chronic venous insufficiency. Wound was initially trauma. We have been using Hydrofera Blue under compression he has home health.. The wound looks a lot better today with improvement in surface area 11/16; left anterior leg wound in the setting of chronic venous insufficiency. Wound was initially trauma. We have been using Hydrofera Blue under compression. The patient is closed today. He is supposed to follow-up with vein and vascular with regards to arterial insufficiency nevertheless his leg wounds are 12/3; apparently 2 weeks ago when they were putting on their stockings they managed  of native coronary artery without angina pectoris Essential (primary) hypertension Type 2 diabetes mellitus with hyperglycemia Type 2 diabetes mellitus with diabetic neuropathy, unspecified Cerebral infarction, unspecified Procedures Wound #2 Pre-procedure diagnosis of Wound #2 is a Diabetic Wound/Ulcer of the Lower Extremity located on the  Right,Plantar Foot . There was a T Contact Cast otal Procedure by Duanne Guess, MD. Post procedure Diagnosis Wound #2: Same as Pre-Procedure Plan Follow-up Appointments: Return Appointment in 1 week. - Dr. Lady Gary - room 2 TCC Anesthetic: (In clinic) Topical Lidocaine 5% applied to wound bed Bathing/ Shower/ Hygiene: May shower with protection but do not get wound dressing(s) wet. Protect dressing(s) with water repellant cover (for example, large plastic bag) or a cast cover and may then take shower. Edema Control - Lymphedema / SCD / Other: Elevate legs to the level of the heart or above for 30 minutes daily and/or when sitting for 3-4 times a day throughout the day. Avoid standing for long periods of time. Patient to wear own compression stockings every day. Moisturize legs daily. Off-Loading: T Contact Cast to Right Lower Extremity otal Removable cast walker boot to: - right leg The following medication(s) was prescribed: lidocaine topical 5 % ointment ointment topical was prescribed at facility WOUND #2: - Foot Wound Laterality: Plantar, Right Cleanser: Soap and Water 1 x Per Week/30 Days Discharge Instructions: May shower and wash wound with dial antibacterial soap and water prior to dressing change. Cleanser: Wound Cleanser 1 x Per Week/30 Days Discharge Instructions: Cleanse the wound with wound cleanser prior to applying a clean dressing using gauze sponges, not tissue or cotton balls. Peri-Wound Care: Zinc Oxide Ointment 30g tube 1 x Per Week/30 Days Discharge Instructions: Apply Zinc Oxide to periwound with each dressing change Topical: Gentamicin 1 x Per Week/30 Days Discharge Instructions: As directed by physician Topical: Mupirocin Ointment 1 x Per Week/30 Days Discharge Instructions: Apply Mupirocin (Bactroban) as instructed Prim Dressing: Maxorb Extra Ag+ Alginate Dressing, 2x2 (in/in) 1 x Per Week/30 Days ary Discharge Instructions: Apply to wound bed as  instructed Secondary Dressing: Woven Gauze Sponge, Non-Sterile 4x4 in 1 x Per Week/30 Days Discharge Instructions: Apply over primary dressing as directed. Secondary Dressing: Zetuvit Plus 4x8 in 1 x Per Week/30 Days Discharge Instructions: Apply over primary dressing as directed. Secured With: [employer], 4.5x3.1 (in/yd) 1 x Per Week/30 Days Discharge Instructions: Secure with Kerlix as directed. Secured With: 95M Medipore H Soft Cloth Surgical T ape, 4 x 10 (in/yd) 1 x Per Week/30 Days Discharge Instructions: Secure with tape as directed. 04/27/2023: The wound continues to contract. The surface is clean. There is a little bit of periwound moisture, but the wound itself has not broken down at all. Malizia, Nicoles A [phone] [id number]) [id number] Page 9 of 11 The wound bed was prepared for total contact casting. T opical gentamicin and mupirocin were applied, followed by silver alginate and periwound zinc oxide. The total contact cast was then applied in standard fashion. Follow-up in 1 week. Electronic Signature(s) Signed: 04/27/2023 9:35:25 AM By: Duanne Guess MD FACS Entered By: Duanne Guess on 04/27/2023 09:35:25 -------------------------------------------------------------------------------- HxROS Details Patient Name: Date of Service: [name], [name] 04/27/2023 9:15 A M Medical Record Number: [id number] Patient Account Number: [id number] Date of Birth/Sex: Treating RN: 05/19/1956 (67 y.o. M) Primary Care Provider: Arva Chafe Other Clinician: Referring Provider: Treating Provider/Extender: [name] in Treatment: 34 Information Obtained From Patient Respiratory Medical History: Positive for: Sleep Apnea Cardiovascular Medical History: Positive for: Arrhythmia -  [place], Knowledge A [id number]) [id number] Page 1 of 11 Visit Report for 04/27/2023 Chief Complaint Document Details Patient Name: Date of Service: Sheldon, Sem [name] 04/27/2023 9:15 A M Medical Record Number: [id number] Patient Account Number: [id number] Date of Birth/Sex: Treating RN: 04-13-1956 (67 y.o. M) Primary Care Provider: Arva Chafe Other Clinician: Referring Provider: Treating Provider/Extender: [name] in Treatment: 34 Information Obtained from: Patient Chief Complaint 05/14/2020; patient is here for review of an abrasion injury on the left lateral calf 08/31/2022: [name] (1st metatarsal base, plantar) Electronic Signature(s) Signed: 04/27/2023 9:21:58 AM By: Duanne Guess MD FACS Entered By: Duanne Guess on 04/27/2023 09:21:58 -------------------------------------------------------------------------------- HPI Details Patient Name: Date of Service: [name], [name] 04/27/2023 9:15 A M Medical Record Number: [id number] Patient Account Number: [id number] Date of Birth/Sex: Treating RN: [birth year] (67 y.o. M) Primary Care Provider: Arva Chafe Other Clinician: Referring Provider: Treating Provider/Extender: [name] in Treatment: 34 History of Present Illness HPI Description: ADMISSION 05/14/2020; this is a 67 year old man with multiple medical issues. Predominantly he has type 2 diabetes with a history of peripheral neuropathy and also history of fairly significant PAD. He had a left superficial femoral to posterior tibial artery bypass in February 2017 he also had an atherectomy and angioplasty by Dr. [name] of the right popliteal artery in 2016. He is supposed to be getting arterial studies annually however this was interrupted last year because of the pandemic. He tells [place] he was at [facility] 2 weeks ago was getting out of of the scooter and traumatized his left  lateral lower leg. There was a lot of bleeding as the patient is on Plavix and Eliquis. They have been dressing this with Neosporin and doing a fairly good job. Wound measures 2.5 x 3.5 it does not have any depth he does not have a wound history in his legs outside of surgery however he does have chronic edema and skin changes suggestive of chronic venous disease possibly some degree of lymphedema as well. Past medical history includes type 2 diabetes with peripheral neuropathy and gait instability, lumbar spondylosis, obstructive sleep apnea, history of a left pontine CVA, basal cell skin cancer, atrial fibrillation on anticoagulation, significant PAD as noted with a left superficial femoral to posterior tibial bypass in February 2017 and a right popliteal atherectomy and angioplasty by Dr. [name] in 30th 2016. He also has a history of coronary artery disease with an MI in 2002 hypertension hyperlipidemia and heart failure with preserved ejection fraction His last arterial studies I can see in epic were on 03/10/2018 this showed a right ABI of 0.69 and a right TBI of 0.5 with monophasic waveforms on the right. On the left his ABI was 1.20 with a TBI of 0.92 and triphasic waveforms. He has not had arterial studies since. Our nurse in the clinic got an ABI on the left of 1.1 11/2; left anterior leg wound in the setting of chronic venous insufficiency. Wound was initially trauma. We have been using Hydrofera Blue under compression he has home health.. The wound looks a lot better today with improvement in surface area 11/16; left anterior leg wound in the setting of chronic venous insufficiency. Wound was initially trauma. We have been using Hydrofera Blue under compression. The patient is closed today. He is supposed to follow-up with vein and vascular with regards to arterial insufficiency nevertheless his leg wounds are 12/3; apparently 2 weeks ago when they were putting on their stockings they managed  A-Fib; Coronary Artery Disease - s/p CABG; Deep Vein Thrombosis; Hypertension; Myocardial Infarction; Peripheral Arterial Disease - s/p Fempop x 2 Past  Medical History Notes: CVA x 3 Endocrine Medical History: Positive for: Type II Diabetes Time with diabetes: since 1997 Treated with: Insulin, Oral agents Blood sugar tested every day: Yes Tested : 2-3x per day Musculoskeletal Medical History: Past Medical History Notes: carpal tunnel syndrome Neurologic Medical History: Positive for: Neuropathy Past Medical History Notes: stroke Oncologic Medical History: Past Medical History Notes: skin cancer Immunizations Pneumococcal Vaccine: Received Pneumococcal Vaccination: Yes Received Pneumococcal Vaccination On or After 60th Birthday: Yes Implantable Devices None Leyba, Duvall A ([id number]) [id number] Page 10 of 11 Hospitalization / Surgery History Type of Hospitalization/Surgery colonoscopy polypectomy peripheral vascular cath shoulder arthroscopy carpal tunnel release coronary artery bypass appendectomy cardiac cath coronary angioplasty thrombectomy knee arthroplasty popliteal artery stent Family and Social History Unknown History: Yes; Never smoker; Marital Status - Single; Alcohol Use: Never; Drug Use: No History; Caffeine Use: Rarely; Financial Concerns: No; Food, Clothing or Shelter Needs: No; Support System Lacking: No; Transportation Concerns: No Electronic Signature(s) Signed: 04/27/2023 9:53:05 AM By: Duanne Guess MD FACS Entered By: Duanne Guess on 04/27/2023 09:33:49 -------------------------------------------------------------------------------- Total Contact Cast Details Patient Name: Date of Service: [name] RK A. 04/27/2023 9:15 A M Medical Record Number: [id number] Patient Account Number: [id number] Date of Birth/Sex: Treating RN: 08-Oct-1955 (67 y.o. [name] Primary Care Provider: Arva Chafe Other Clinician: Referring Provider: Treating Provider/Extender: [name] in Treatment: 34 T Contact Cast Applied for  Wound Assessment: otal Wound #2 Right,Plantar Foot Performed By: Physician Duanne Guess, MD The following information was scribed by: Samuella Bruin The information was scribed for: Duanne Guess Post Procedure Diagnosis Same as Pre-procedure Electronic Signature(s) Signed: 04/27/2023 9:53:05 AM By: Duanne Guess MD FACS Signed: 04/27/2023 3:15:01 PM By: Samuella Bruin Entered By: Samuella Bruin on 04/27/2023 09:15:05 -------------------------------------------------------------------------------- SuperBill Details Patient Name: Date of Service: [name], [name] 04/27/2023 Medical Record Number: [id number] Patient Account Number: [id number] Date of Birth/Sex: Treating RN: 10/03/1955 (67 y.o. M) Primary Care Provider: Arva Chafe Other Clinician: Referring Provider: Treating Provider/Extender: [name] in Treatment: 34 Diagnosis Coding ICD-10 Codes Bickham, Derrico A ([id number]) [id number] Page 11 of 11 Code Description L97.512 Non-pressure chronic ulcer of other part of right foot with fat layer exposed I73.9 Peripheral vascular disease, unspecified I25.10 Atherosclerotic heart disease of native coronary artery without angina pectoris I10 Essential (primary) hypertension E11.65 Type 2 diabetes mellitus with hyperglycemia E11.40 Type 2 diabetes mellitus with diabetic neuropathy, unspecified I63.9 Cerebral infarction, unspecified Facility Procedures : CPT4 Code: 10932355 Description: 73220 - APPLY TOTAL CONTACT LEG CAST ICD-10 Diagnosis Description L97.512 Non-pressure chronic ulcer of other part of right foot with fat layer exposed Modifier: Quantity: 1 Physician Procedures : CPT4 Code Description Modifier [id number] 99214 - WC PHYS LEVEL 4 - EST PT 25 ICD-10 Diagnosis Description L97.512 Non-pressure chronic ulcer of other part of right foot with fat layer exposed I73.9 Peripheral vascular disease,  unspecified E11.65 Type 2  diabetes mellitus with hyperglycemia E11.40 Type 2 diabetes mellitus with diabetic neuropathy, unspecified Quantity: 1 : [id number] - WC PHYS APPLY TOTAL CONTACT CAST ICD-10 Diagnosis Description L97.512 Non-pressure chronic ulcer of other part of right foot with fat layer exposed Quantity: 1 Electronic Signature(s) Signed: 04/27/2023 9:35:48 AM By: Duanne Guess MD FACS Entered By: Duanne Guess on 04/27/2023 09:35:47  dressing as directed. Secured With: [employer], 4.5x3.1 (in/yd) 1 x Per Week/30 Days Discharge Instructions: Secure with Kerlix as directed. Secured With:  56M Medipore H Soft Cloth Surgical T ape, 4 x 10 (in/yd) 1 x Per Week/30 Days Discharge Instructions: Secure with tape as directed. Patient Medications llergies: No Known Drug Allergies A Notifications Medication Indication Start End 04/27/2023 lidocaine DOSE topical 5 % ointment - ointment topical Electronic Signature(s) Signed: 04/27/2023 9:53:05 AM By: Duanne Guess MD FACS Entered By: Duanne Guess on 04/27/2023 09:34:35 -------------------------------------------------------------------------------- Problem List Details Patient Name: Date of Service: [name], [name] 04/27/2023 9:15 A M Medical Record Number: [id number] Patient Account Number: [id number] Date of Birth/Sex: Treating RN: 1955/12/02 (67 y.o. Renee Harder, Kayle A ([id number]) [id number] Page 5 of 11 Primary Care Provider: Arva Chafe Other Clinician: Referring Provider: Treating Provider/Extender: [name] in Treatment: 34 Active Problems ICD-10 Encounter Code Description Active Date MDM Diagnosis L97.512 Non-pressure chronic ulcer of other part of right foot with fat layer exposed 08/31/2022 No Yes I73.9 Peripheral vascular disease, unspecified 08/31/2022 No Yes I25.10 Atherosclerotic heart disease of native coronary artery without angina pectoris 08/31/2022 No Yes I10 Essential (primary) hypertension 08/31/2022 No Yes E11.65 Type 2 diabetes mellitus with hyperglycemia 08/31/2022 No Yes E11.40 Type 2 diabetes mellitus with diabetic neuropathy, unspecified 08/31/2022 No Yes I63.9 Cerebral infarction, unspecified 08/31/2022 No Yes Inactive Problems Resolved Problems ICD-10 Code Description Active Date Resolved Date L97.821 Non-pressure chronic ulcer of other part of left lower leg limited to breakdown of skin 11/05/2022 11/05/2022 Electronic Signature(s) Signed: 04/27/2023 9:20:05 AM By: Duanne Guess MD FACS Entered By: Duanne Guess on 04/27/2023  09:20:05 -------------------------------------------------------------------------------- Progress Note Details Patient Name: Date of Service: [name], [name] 04/27/2023 9:15 A M Medical Record Number: [id number] Patient Account Number: [id number] Date of Birth/Sex: Treating RN: [birth year] (67 y.o. M) Primary Care Provider: Arva Chafe Other Clinician: Referring Provider: Treating Provider/Extender: [name] in Treatment: 34 Subjective Chief Complaint Information obtained from Patient 05/14/2020; patient is here for review of an abrasion injury on the left lateral calf 08/31/2022: [name] (1st metatarsal base, plantar) Guilbault, Rilley A ([id number]) [id number] Page 6 of 11 History of Present Illness (HPI) ADMISSION 05/14/2020; this is a 67 year old man with multiple medical issues. Predominantly he has type 2 diabetes with a history of peripheral neuropathy and also history of fairly significant PAD. He had a left superficial femoral to posterior tibial artery bypass in February 2017 he also had an atherectomy and angioplasty by Dr. [name] of the right popliteal artery in 2016. He is supposed to be getting arterial studies annually however this was interrupted last year because of the pandemic. He tells [place] he was at [facility] 2 weeks ago was getting out of of the scooter and traumatized his left lateral lower leg. There was a lot of bleeding as the patient is on Plavix and Eliquis. They have been dressing this with Neosporin and doing a fairly good job. Wound measures 2.5 x 3.5 it does not have any depth he does not have a wound history in his legs outside of surgery however he does have chronic edema and skin changes suggestive of chronic venous disease possibly some degree of lymphedema as well. Past medical history includes type 2 diabetes with peripheral neuropathy and gait instability, lumbar spondylosis, obstructive sleep  apnea, history of a left pontine CVA, basal cell skin cancer, atrial fibrillation on anticoagulation, significant PAD as noted with a left superficial femoral  A-Fib; Coronary Artery Disease - s/p CABG; Deep Vein Thrombosis; Hypertension; Myocardial Infarction; Peripheral Arterial Disease - s/p Fempop x 2 Past  Medical History Notes: CVA x 3 Endocrine Medical History: Positive for: Type II Diabetes Time with diabetes: since 1997 Treated with: Insulin, Oral agents Blood sugar tested every day: Yes Tested : 2-3x per day Musculoskeletal Medical History: Past Medical History Notes: carpal tunnel syndrome Neurologic Medical History: Positive for: Neuropathy Past Medical History Notes: stroke Oncologic Medical History: Past Medical History Notes: skin cancer Immunizations Pneumococcal Vaccine: Received Pneumococcal Vaccination: Yes Received Pneumococcal Vaccination On or After 60th Birthday: Yes Implantable Devices None Leyba, Duvall A ([id number]) [id number] Page 10 of 11 Hospitalization / Surgery History Type of Hospitalization/Surgery colonoscopy polypectomy peripheral vascular cath shoulder arthroscopy carpal tunnel release coronary artery bypass appendectomy cardiac cath coronary angioplasty thrombectomy knee arthroplasty popliteal artery stent Family and Social History Unknown History: Yes; Never smoker; Marital Status - Single; Alcohol Use: Never; Drug Use: No History; Caffeine Use: Rarely; Financial Concerns: No; Food, Clothing or Shelter Needs: No; Support System Lacking: No; Transportation Concerns: No Electronic Signature(s) Signed: 04/27/2023 9:53:05 AM By: Duanne Guess MD FACS Entered By: Duanne Guess on 04/27/2023 09:33:49 -------------------------------------------------------------------------------- Total Contact Cast Details Patient Name: Date of Service: [name] RK A. 04/27/2023 9:15 A M Medical Record Number: [id number] Patient Account Number: [id number] Date of Birth/Sex: Treating RN: 08-Oct-1955 (67 y.o. [name] Primary Care Provider: Arva Chafe Other Clinician: Referring Provider: Treating Provider/Extender: [name] in Treatment: 34 T Contact Cast Applied for  Wound Assessment: otal Wound #2 Right,Plantar Foot Performed By: Physician Duanne Guess, MD The following information was scribed by: Samuella Bruin The information was scribed for: Duanne Guess Post Procedure Diagnosis Same as Pre-procedure Electronic Signature(s) Signed: 04/27/2023 9:53:05 AM By: Duanne Guess MD FACS Signed: 04/27/2023 3:15:01 PM By: Samuella Bruin Entered By: Samuella Bruin on 04/27/2023 09:15:05 -------------------------------------------------------------------------------- SuperBill Details Patient Name: Date of Service: [name], [name] 04/27/2023 Medical Record Number: [id number] Patient Account Number: [id number] Date of Birth/Sex: Treating RN: 10/03/1955 (67 y.o. M) Primary Care Provider: Arva Chafe Other Clinician: Referring Provider: Treating Provider/Extender: [name] in Treatment: 34 Diagnosis Coding ICD-10 Codes Bickham, Derrico A ([id number]) [id number] Page 11 of 11 Code Description L97.512 Non-pressure chronic ulcer of other part of right foot with fat layer exposed I73.9 Peripheral vascular disease, unspecified I25.10 Atherosclerotic heart disease of native coronary artery without angina pectoris I10 Essential (primary) hypertension E11.65 Type 2 diabetes mellitus with hyperglycemia E11.40 Type 2 diabetes mellitus with diabetic neuropathy, unspecified I63.9 Cerebral infarction, unspecified Facility Procedures : CPT4 Code: 10932355 Description: 73220 - APPLY TOTAL CONTACT LEG CAST ICD-10 Diagnosis Description L97.512 Non-pressure chronic ulcer of other part of right foot with fat layer exposed Modifier: Quantity: 1 Physician Procedures : CPT4 Code Description Modifier [id number] 99214 - WC PHYS LEVEL 4 - EST PT 25 ICD-10 Diagnosis Description L97.512 Non-pressure chronic ulcer of other part of right foot with fat layer exposed I73.9 Peripheral vascular disease,  unspecified E11.65 Type 2  diabetes mellitus with hyperglycemia E11.40 Type 2 diabetes mellitus with diabetic neuropathy, unspecified Quantity: 1 : [id number] - WC PHYS APPLY TOTAL CONTACT CAST ICD-10 Diagnosis Description L97.512 Non-pressure chronic ulcer of other part of right foot with fat layer exposed Quantity: 1 Electronic Signature(s) Signed: 04/27/2023 9:35:48 AM By: Duanne Guess MD FACS Entered By: Duanne Guess on 04/27/2023 09:35:47  dressing as directed. Secured With: [employer], 4.5x3.1 (in/yd) 1 x Per Week/30 Days Discharge Instructions: Secure with Kerlix as directed. Secured With:  56M Medipore H Soft Cloth Surgical T ape, 4 x 10 (in/yd) 1 x Per Week/30 Days Discharge Instructions: Secure with tape as directed. Patient Medications llergies: No Known Drug Allergies A Notifications Medication Indication Start End 04/27/2023 lidocaine DOSE topical 5 % ointment - ointment topical Electronic Signature(s) Signed: 04/27/2023 9:53:05 AM By: Duanne Guess MD FACS Entered By: Duanne Guess on 04/27/2023 09:34:35 -------------------------------------------------------------------------------- Problem List Details Patient Name: Date of Service: [name], [name] 04/27/2023 9:15 A M Medical Record Number: [id number] Patient Account Number: [id number] Date of Birth/Sex: Treating RN: 1955/12/02 (67 y.o. Renee Harder, Kayle A ([id number]) [id number] Page 5 of 11 Primary Care Provider: Arva Chafe Other Clinician: Referring Provider: Treating Provider/Extender: [name] in Treatment: 34 Active Problems ICD-10 Encounter Code Description Active Date MDM Diagnosis L97.512 Non-pressure chronic ulcer of other part of right foot with fat layer exposed 08/31/2022 No Yes I73.9 Peripheral vascular disease, unspecified 08/31/2022 No Yes I25.10 Atherosclerotic heart disease of native coronary artery without angina pectoris 08/31/2022 No Yes I10 Essential (primary) hypertension 08/31/2022 No Yes E11.65 Type 2 diabetes mellitus with hyperglycemia 08/31/2022 No Yes E11.40 Type 2 diabetes mellitus with diabetic neuropathy, unspecified 08/31/2022 No Yes I63.9 Cerebral infarction, unspecified 08/31/2022 No Yes Inactive Problems Resolved Problems ICD-10 Code Description Active Date Resolved Date L97.821 Non-pressure chronic ulcer of other part of left lower leg limited to breakdown of skin 11/05/2022 11/05/2022 Electronic Signature(s) Signed: 04/27/2023 9:20:05 AM By: Duanne Guess MD FACS Entered By: Duanne Guess on 04/27/2023  09:20:05 -------------------------------------------------------------------------------- Progress Note Details Patient Name: Date of Service: [name], [name] 04/27/2023 9:15 A M Medical Record Number: [id number] Patient Account Number: [id number] Date of Birth/Sex: Treating RN: [birth year] (67 y.o. M) Primary Care Provider: Arva Chafe Other Clinician: Referring Provider: Treating Provider/Extender: [name] in Treatment: 34 Subjective Chief Complaint Information obtained from Patient 05/14/2020; patient is here for review of an abrasion injury on the left lateral calf 08/31/2022: [name] (1st metatarsal base, plantar) Guilbault, Rilley A ([id number]) [id number] Page 6 of 11 History of Present Illness (HPI) ADMISSION 05/14/2020; this is a 67 year old man with multiple medical issues. Predominantly he has type 2 diabetes with a history of peripheral neuropathy and also history of fairly significant PAD. He had a left superficial femoral to posterior tibial artery bypass in February 2017 he also had an atherectomy and angioplasty by Dr. [name] of the right popliteal artery in 2016. He is supposed to be getting arterial studies annually however this was interrupted last year because of the pandemic. He tells [place] he was at [facility] 2 weeks ago was getting out of of the scooter and traumatized his left lateral lower leg. There was a lot of bleeding as the patient is on Plavix and Eliquis. They have been dressing this with Neosporin and doing a fairly good job. Wound measures 2.5 x 3.5 it does not have any depth he does not have a wound history in his legs outside of surgery however he does have chronic edema and skin changes suggestive of chronic venous disease possibly some degree of lymphedema as well. Past medical history includes type 2 diabetes with peripheral neuropathy and gait instability, lumbar spondylosis, obstructive sleep  apnea, history of a left pontine CVA, basal cell skin cancer, atrial fibrillation on anticoagulation, significant PAD as noted with a left superficial femoral  [place], Knowledge A [id number]) [id number] Page 1 of 11 Visit Report for 04/27/2023 Chief Complaint Document Details Patient Name: Date of Service: [place], Sem [name] 04/27/2023 9:15 A M Medical Record Number: [id number] Patient Account Number: [id number] Date of Birth/Sex: Treating RN: 04-13-1956 (67 y.o. M) Primary Care Provider: Arva Chafe Other Clinician: Referring Provider: Treating Provider/Extender: [name] in Treatment: 34 Information Obtained from: Patient Chief Complaint 05/14/2020; patient is here for review of an abrasion injury on the left lateral calf 08/31/2022: [name] (1st metatarsal base, plantar) Electronic Signature(s) Signed: 04/27/2023 9:21:58 AM By: Duanne Guess MD FACS Entered By: Duanne Guess on 04/27/2023 09:21:58 -------------------------------------------------------------------------------- HPI Details Patient Name: Date of Service: [name], [name] 04/27/2023 9:15 A M Medical Record Number: [id number] Patient Account Number: [id number] Date of Birth/Sex: Treating RN: [birth year] (67 y.o. M) Primary Care Provider: Arva Chafe Other Clinician: Referring Provider: Treating Provider/Extender: [name] in Treatment: 34 History of Present Illness HPI Description: ADMISSION 05/14/2020; this is a 67 year old man with multiple medical issues. Predominantly he has type 2 diabetes with a history of peripheral neuropathy and also history of fairly significant PAD. He had a left superficial femoral to posterior tibial artery bypass in February 2017 he also had an atherectomy and angioplasty by Dr. [name] of the right popliteal artery in 2016. He is supposed to be getting arterial studies annually however this was interrupted last year because of the pandemic. He tells [place] he was at [facility] 2 weeks ago was getting out of of the scooter and traumatized his left  lateral lower leg. There was a lot of bleeding as the patient is on Plavix and Eliquis. They have been dressing this with Neosporin and doing a fairly good job. Wound measures 2.5 x 3.5 it does not have any depth he does not have a wound history in his legs outside of surgery however he does have chronic edema and skin changes suggestive of chronic venous disease possibly some degree of lymphedema as well. Past medical history includes type 2 diabetes with peripheral neuropathy and gait instability, lumbar spondylosis, obstructive sleep apnea, history of a left pontine CVA, basal cell skin cancer, atrial fibrillation on anticoagulation, significant PAD as noted with a left superficial femoral to posterior tibial bypass in February 2017 and a right popliteal atherectomy and angioplasty by Dr. [name] in 30th 2016. He also has a history of coronary artery disease with an MI in 2002 hypertension hyperlipidemia and heart failure with preserved ejection fraction His last arterial studies I can see in epic were on 03/10/2018 this showed a right ABI of 0.69 and a right TBI of 0.5 with monophasic waveforms on the right. On the left his ABI was 1.20 with a TBI of 0.92 and triphasic waveforms. He has not had arterial studies since. Our nurse in the clinic got an ABI on the left of 1.1 11/2; left anterior leg wound in the setting of chronic venous insufficiency. Wound was initially trauma. We have been using Hydrofera Blue under compression he has home health.. The wound looks a lot better today with improvement in surface area 11/16; left anterior leg wound in the setting of chronic venous insufficiency. Wound was initially trauma. We have been using Hydrofera Blue under compression. The patient is closed today. He is supposed to follow-up with vein and vascular with regards to arterial insufficiency nevertheless his leg wounds are 12/3; apparently 2 weeks ago when they were putting on their stockings they managed  of native coronary artery without angina pectoris Essential (primary) hypertension Type 2 diabetes mellitus with hyperglycemia Type 2 diabetes mellitus with diabetic neuropathy, unspecified Cerebral infarction, unspecified Procedures Wound #2 Pre-procedure diagnosis of Wound #2 is a Diabetic Wound/Ulcer of the Lower Extremity located on the  Right,Plantar Foot . There was a T Contact Cast otal Procedure by Duanne Guess, MD. Post procedure Diagnosis Wound #2: Same as Pre-Procedure Plan Follow-up Appointments: Return Appointment in 1 week. - Dr. Lady Gary - room 2 TCC Anesthetic: (In clinic) Topical Lidocaine 5% applied to wound bed Bathing/ Shower/ Hygiene: May shower with protection but do not get wound dressing(s) wet. Protect dressing(s) with water repellant cover (for example, large plastic bag) or a cast cover and may then take shower. Edema Control - Lymphedema / SCD / Other: Elevate legs to the level of the heart or above for 30 minutes daily and/or when sitting for 3-4 times a day throughout the day. Avoid standing for long periods of time. Patient to wear own compression stockings every day. Moisturize legs daily. Off-Loading: T Contact Cast to Right Lower Extremity otal Removable cast walker boot to: - right leg The following medication(s) was prescribed: lidocaine topical 5 % ointment ointment topical was prescribed at facility WOUND #2: - Foot Wound Laterality: Plantar, Right Cleanser: Soap and Water 1 x Per Week/30 Days Discharge Instructions: May shower and wash wound with dial antibacterial soap and water prior to dressing change. Cleanser: Wound Cleanser 1 x Per Week/30 Days Discharge Instructions: Cleanse the wound with wound cleanser prior to applying a clean dressing using gauze sponges, not tissue or cotton balls. Peri-Wound Care: Zinc Oxide Ointment 30g tube 1 x Per Week/30 Days Discharge Instructions: Apply Zinc Oxide to periwound with each dressing change Topical: Gentamicin 1 x Per Week/30 Days Discharge Instructions: As directed by physician Topical: Mupirocin Ointment 1 x Per Week/30 Days Discharge Instructions: Apply Mupirocin (Bactroban) as instructed Prim Dressing: Maxorb Extra Ag+ Alginate Dressing, 2x2 (in/in) 1 x Per Week/30 Days ary Discharge Instructions: Apply to wound bed as  instructed Secondary Dressing: Woven Gauze Sponge, Non-Sterile 4x4 in 1 x Per Week/30 Days Discharge Instructions: Apply over primary dressing as directed. Secondary Dressing: Zetuvit Plus 4x8 in 1 x Per Week/30 Days Discharge Instructions: Apply over primary dressing as directed. Secured With: [employer], 4.5x3.1 (in/yd) 1 x Per Week/30 Days Discharge Instructions: Secure with Kerlix as directed. Secured With: 95M Medipore H Soft Cloth Surgical T ape, 4 x 10 (in/yd) 1 x Per Week/30 Days Discharge Instructions: Secure with tape as directed. 04/27/2023: The wound continues to contract. The surface is clean. There is a little bit of periwound moisture, but the wound itself has not broken down at all. Malizia, Nicoles A [phone] [id number]) [id number] Page 9 of 11 The wound bed was prepared for total contact casting. T opical gentamicin and mupirocin were applied, followed by silver alginate and periwound zinc oxide. The total contact cast was then applied in standard fashion. Follow-up in 1 week. Electronic Signature(s) Signed: 04/27/2023 9:35:25 AM By: Duanne Guess MD FACS Entered By: Duanne Guess on 04/27/2023 09:35:25 -------------------------------------------------------------------------------- HxROS Details Patient Name: Date of Service: [name], [name] 04/27/2023 9:15 A M Medical Record Number: [id number] Patient Account Number: [id number] Date of Birth/Sex: Treating RN: 05/19/1956 (67 y.o. M) Primary Care Provider: Arva Chafe Other Clinician: Referring Provider: Treating Provider/Extender: [name] in Treatment: 34 Information Obtained From Patient Respiratory Medical History: Positive for: Sleep Apnea Cardiovascular Medical History: Positive for: Arrhythmia -  dressing as directed. Secured With: [employer], 4.5x3.1 (in/yd) 1 x Per Week/30 Days Discharge Instructions: Secure with Kerlix as directed. Secured With:  56M Medipore H Soft Cloth Surgical T ape, 4 x 10 (in/yd) 1 x Per Week/30 Days Discharge Instructions: Secure with tape as directed. Patient Medications llergies: No Known Drug Allergies A Notifications Medication Indication Start End 04/27/2023 lidocaine DOSE topical 5 % ointment - ointment topical Electronic Signature(s) Signed: 04/27/2023 9:53:05 AM By: Duanne Guess MD FACS Entered By: Duanne Guess on 04/27/2023 09:34:35 -------------------------------------------------------------------------------- Problem List Details Patient Name: Date of Service: [name], [name] 04/27/2023 9:15 A M Medical Record Number: [id number] Patient Account Number: [id number] Date of Birth/Sex: Treating RN: 1955/12/02 (67 y.o. Renee Harder, Kayle A ([id number]) [id number] Page 5 of 11 Primary Care Provider: Arva Chafe Other Clinician: Referring Provider: Treating Provider/Extender: [name] in Treatment: 34 Active Problems ICD-10 Encounter Code Description Active Date MDM Diagnosis L97.512 Non-pressure chronic ulcer of other part of right foot with fat layer exposed 08/31/2022 No Yes I73.9 Peripheral vascular disease, unspecified 08/31/2022 No Yes I25.10 Atherosclerotic heart disease of native coronary artery without angina pectoris 08/31/2022 No Yes I10 Essential (primary) hypertension 08/31/2022 No Yes E11.65 Type 2 diabetes mellitus with hyperglycemia 08/31/2022 No Yes E11.40 Type 2 diabetes mellitus with diabetic neuropathy, unspecified 08/31/2022 No Yes I63.9 Cerebral infarction, unspecified 08/31/2022 No Yes Inactive Problems Resolved Problems ICD-10 Code Description Active Date Resolved Date L97.821 Non-pressure chronic ulcer of other part of left lower leg limited to breakdown of skin 11/05/2022 11/05/2022 Electronic Signature(s) Signed: 04/27/2023 9:20:05 AM By: Duanne Guess MD FACS Entered By: Duanne Guess on 04/27/2023  09:20:05 -------------------------------------------------------------------------------- Progress Note Details Patient Name: Date of Service: [name], [name] 04/27/2023 9:15 A M Medical Record Number: [id number] Patient Account Number: [id number] Date of Birth/Sex: Treating RN: [birth year] (67 y.o. M) Primary Care Provider: Arva Chafe Other Clinician: Referring Provider: Treating Provider/Extender: [name] in Treatment: 34 Subjective Chief Complaint Information obtained from Patient 05/14/2020; patient is here for review of an abrasion injury on the left lateral calf 08/31/2022: [name] (1st metatarsal base, plantar) Guilbault, Rilley A ([id number]) [id number] Page 6 of 11 History of Present Illness (HPI) ADMISSION 05/14/2020; this is a 67 year old man with multiple medical issues. Predominantly he has type 2 diabetes with a history of peripheral neuropathy and also history of fairly significant PAD. He had a left superficial femoral to posterior tibial artery bypass in February 2017 he also had an atherectomy and angioplasty by Dr. [name] of the right popliteal artery in 2016. He is supposed to be getting arterial studies annually however this was interrupted last year because of the pandemic. He tells [place] he was at [facility] 2 weeks ago was getting out of of the scooter and traumatized his left lateral lower leg. There was a lot of bleeding as the patient is on Plavix and Eliquis. They have been dressing this with Neosporin and doing a fairly good job. Wound measures 2.5 x 3.5 it does not have any depth he does not have a wound history in his legs outside of surgery however he does have chronic edema and skin changes suggestive of chronic venous disease possibly some degree of lymphedema as well. Past medical history includes type 2 diabetes with peripheral neuropathy and gait instability, lumbar spondylosis, obstructive sleep  apnea, history of a left pontine CVA, basal cell skin cancer, atrial fibrillation on anticoagulation, significant PAD as noted with a left superficial femoral

## 2023-04-27 NOTE — Progress Notes (Signed)
Volume (cm) : 94.50% N/A N/A % Reduction in A rea: 94.50% N/A N/A % Reduction in Volume: Grade 1 N/A N/A Classification: Medium N/A N/A Exudate A mount: Serosanguineous N/A N/A Exudate Type: red, brown N/A N/A Exudate Color: Distinct, outline attached N/A N/A Wound Margin: Large (67-100%) N/A N/A Granulation A mount: Red N/A N/A Granulation Quality: Small (1-33%) N/A N/A Necrotic A mount: Fat Layer (Subcutaneous Tissue): Yes N/A N/A Exposed Structures: Fascia: No Tendon: No Muscle: No Joint: No Bone: No Medium (34-66%) N/A N/A Epithelialization: Callus: Yes N/A N/A Periwound Skin Texture: Maceration: Yes N/A N/A Periwound Skin Moisture: Dry/Scaly: No No Abnormalities Noted N/A N/A Periwound Skin Color: No Abnormality N/A N/A Temperature: T Contact Cast otal N/A N/A Procedures Performed: Treatment Notes Electronic Signature(s) Signed: 04/27/2023 9:21:49 AM By: Duanne Guess MD FACS Entered By: Duanne Guess on 04/27/2023 09:21:49 -------------------------------------------------------------------------------- Multi-Disciplinary Care  Plan Details Patient Name: Date of Service: [name] Pian, [name] 04/27/2023 9:15 A M Medical Record Number: [id number] Patient Account Number: [id number] Date of Birth/Sex: Treating RN: 22-Aug-1955 (67 y.o. [name] Primary Care Natasa Stigall: Arva Chafe Other Clinician: Referring Lakara Weiland: Treating Tashica Provencio/Extender: [name] in Treatment: 34 Multidisciplinary Care Plan reviewed with physician Active Inactive Abuse / Safety / Falls / Self Care Management Nursing Diagnoses: ZAVIEN, CLUBB A ([id number]) [id number] Page 4 of 7 Impaired physical mobility Potential for falls Goals: Patient will not experience any injury related to falls Date Initiated: 08/31/2022 Target Resolution Date: 06/18/2023 Goal Status: Active Patient/caregiver will verbalize/demonstrate measures taken to improve the patient's personal safety Date Initiated: 08/31/2022 Target Resolution Date: 06/18/2023 Goal Status: Active Interventions: Provide education on basic hygiene Provide education on fall prevention Notes: Wound/Skin Impairment Nursing Diagnoses: Impaired tissue integrity Knowledge deficit related to ulceration/compromised skin integrity Goals: Patient/caregiver will verbalize understanding of skin care regimen Date Initiated: 08/31/2022 Target Resolution Date: 06/18/2023 Goal Status: Active Interventions: Assess patient/caregiver ability to obtain necessary supplies Assess patient/caregiver ability to perform ulcer/skin care regimen upon admission and as needed Assess ulceration(s) every visit Treatment Activities: Skin care regimen initiated : 08/31/2022 Topical wound management initiated : 08/31/2022 Notes: Electronic Signature(s) Signed: 04/27/2023 3:15:01 PM By: Samuella Bruin Entered By: Samuella Bruin on 04/27/2023 09:00:22 -------------------------------------------------------------------------------- Pain  Assessment Details Patient Name: Date of Service: [name] Pian, [name] 04/27/2023 9:15 A M Medical Record Number: [id number] Patient Account Number: [id number] Date of Birth/Sex: Treating RN: August 17, 1955 (67 y.o. M) Primary Care Yen Wandell: Arva Chafe Other Clinician: Referring Ventura Hollenbeck: Treating Algernon Mundie/Extender: [name] in Treatment: 34 Active Problems Location of Pain Severity and Description of Pain Patient Has Paino No Site Locations [place], [place] A ([id number]) [id number] Page 5 of 7 Pain Management and Medication Current Pain Management: Electronic Signature(s) Signed: 04/27/2023 10:37:36 AM By: Dayton Scrape Entered By: Dayton Scrape on 04/27/2023 09:01:29 -------------------------------------------------------------------------------- Patient/Caregiver Education Details Patient Name: Date of Service: [name] RK A. 10/8/2024andnbsp9:15 A M Medical Record Number: [id number] Patient Account Number: [id number] Date of Birth/Gender: Treating RN: 1956/04/13 (67 y.o. [name] Primary Care Physician: Arva Chafe Other Clinician: Referring Physician: Treating Physician/Extender: [name] in Treatment: 56 Education Assessment Education Provided To: Patient Education Topics Provided Wound/Skin Impairment: Methods: Explain/Verbal Responses: Reinforcements needed, State content correctly Electronic Signature(s) Signed: 04/27/2023 3:15:01 PM By: Samuella Bruin Entered By: Samuella Bruin on 04/27/2023 09:00:36 -------------------------------------------------------------------------------- Wound Assessment Details Patient Name: Date of Service: [name] Pian, [name] 04/27/2023 9:15 A M Medical Record Number: [id number] Patient Account Number: [id number] Date of Birth/Sex: Treating  [name], [name] [id number]) [id number] Page 1 of 7 Visit Report for 04/27/2023 Arrival Information Details Patient Name: Date of Service: [name], [name] 04/27/2023 9:15 A M Medical Record Number: [id number] Patient Account Number: [id number] Date of Birth/Sex: Treating RN: 08-13-1955 (67 y.o. M) Primary Care Asiah Browder: Arva Chafe Other Clinician: Referring Jhony Antrim: Treating Admiral Marcucci/Extender: [name] in Treatment: 34 Visit Information History Since Last Visit Added or deleted any medications: No Patient Arrived: Ambulatory Any new allergies or adverse reactions: No Arrival Time: 09:00 Had a fall or experienced change in No Accompanied By: girlfriend activities of daily living that may affect Transfer Assistance: None risk of falls: Patient Identification Verified: Yes Signs or symptoms of abuse/neglect since last visito No Secondary Verification Process Completed: Yes Hospitalized since last visit: No Patient Requires Transmission-Based Precautions: No Implantable device outside of the clinic excluding No Patient Has Alerts: No cellular tissue based products placed in the center since last visit: Pain Present Now: No Electronic Signature(s) Signed: 04/27/2023 10:37:36 AM By: Dayton Scrape Entered By: Dayton Scrape on 04/27/2023 09:01:03 -------------------------------------------------------------------------------- Encounter Discharge Information Details Patient Name: Date of Service: [name] Pian, [name] 04/27/2023 9:15 A M Medical Record Number: [id number] Patient Account Number: [id number] Date of Birth/Sex: Treating RN: 25-Nov-1955 (67 y.o. [name] Primary Care Margherita Collyer: Arva Chafe Other Clinician: Referring Favour Aleshire: Treating Baili Stang/Extender: [name] in Treatment: 34 Encounter Discharge Information Items Discharge Condition: Stable Ambulatory Status:  Ambulatory Discharge Destination: Home Transportation: Private Auto Accompanied By: self Schedule Follow-up Appointment: Yes Clinical Summary of Care: Patient Declined Electronic Signature(s) Signed: 04/27/2023 3:15:01 PM By: Samuella Bruin Entered By: Samuella Bruin on 04/27/2023 09:33:40 Derryberry, Raghav A ([id number]) [id number] Page 2 of 7 -------------------------------------------------------------------------------- Lower Extremity Assessment Details Patient Name: Date of Service: NUTTALL, [name] 04/27/2023 9:15 A M Medical Record Number: [id number] Patient Account Number: [id number] Date of Birth/Sex: Treating RN: [birth year] (67 y.o. [name] Primary Care Tavio Biegel: Arva Chafe Other Clinician: Referring Amoree Newlon: Treating Malynda Smolinski/Extender: [name] in Treatment: 34 Edema Assessment Assessed: [name]: No] [name]: No] Edema: [Left: Ye] [Right: s] Calf Left: Right: Point of Measurement: From Medial Instep 38.5 cm Ankle Left: Right: Point of Measurement: From Medial Instep 27.8 cm Vascular Assessment Pulses: Dorsalis Pedis Palpable: [Right:Yes] Extremity colors, hair growth, and conditions: Extremity Color: [Right:Normal] Hair Growth on Extremity: [Right:Yes] Temperature of Extremity: [Right:Warm] Capillary Refill: [Right:< 3 seconds] Dependent Rubor: [Right:No No] Electronic Signature(s) Signed: 04/27/2023 3:15:01 PM By: Samuella Bruin Entered By: Samuella Bruin on 04/27/2023 09:09:19 -------------------------------------------------------------------------------- Multi Wound Chart Details Patient Name: Date of Service: [name] Pian, [name] 04/27/2023 9:15 A M Medical Record Number: [id number] Patient Account Number: [id number] Date of Birth/Sex: Treating RN: 08/02/1955 (67 y.o. M) Primary Care Enslie Sahota: Arva Chafe Other Clinician: Referring Clara Herbison: Treating Flannery Cavallero/Extender:  [name] in Treatment: 34 Vital Signs Height(in): 70 Pulse(bpm): 52 Weight(lbs): 260 Blood Pressure(mmHg): 194/80 Body Mass Index(BMI): 37.3 Temperature(F): 97.9 Respiratory Rate(breaths/min): 20 [2:Photos:] [N/A:N/A] Right, Plantar Foot N/A N/A Wound Location: Gradually Appeared N/A N/A Wounding Event: Diabetic Wound/Ulcer of the Lower N/A N/A Primary Etiology: Extremity Sleep Apnea, Arrhythmia, Coronary N/A N/A Comorbid History: Artery Disease, Deep Vein Thrombosis, Hypertension, Myocardial Infarction, Peripheral Arterial Disease, Type II Diabetes, Neuropathy 07/08/2022 N/A N/A Date Acquired: 62 N/A N/A Weeks of Treatment: Open N/A N/A Wound Status: No N/A N/A Wound Recurrence: 0.3x0.6x0.1 N/A N/A Measurements L x W x D (cm) 0.141 N/A N/A A (cm) : rea 0.014 N/A N/A  Volume (cm) : 94.50% N/A N/A % Reduction in A rea: 94.50% N/A N/A % Reduction in Volume: Grade 1 N/A N/A Classification: Medium N/A N/A Exudate A mount: Serosanguineous N/A N/A Exudate Type: red, brown N/A N/A Exudate Color: Distinct, outline attached N/A N/A Wound Margin: Large (67-100%) N/A N/A Granulation A mount: Red N/A N/A Granulation Quality: Small (1-33%) N/A N/A Necrotic A mount: Fat Layer (Subcutaneous Tissue): Yes N/A N/A Exposed Structures: Fascia: No Tendon: No Muscle: No Joint: No Bone: No Medium (34-66%) N/A N/A Epithelialization: Callus: Yes N/A N/A Periwound Skin Texture: Maceration: Yes N/A N/A Periwound Skin Moisture: Dry/Scaly: No No Abnormalities Noted N/A N/A Periwound Skin Color: No Abnormality N/A N/A Temperature: T Contact Cast otal N/A N/A Procedures Performed: Treatment Notes Electronic Signature(s) Signed: 04/27/2023 9:21:49 AM By: Duanne Guess MD FACS Entered By: Duanne Guess on 04/27/2023 09:21:49 -------------------------------------------------------------------------------- Multi-Disciplinary Care  Plan Details Patient Name: Date of Service: [name] Pian, [name] 04/27/2023 9:15 A M Medical Record Number: [id number] Patient Account Number: [id number] Date of Birth/Sex: Treating RN: 22-Aug-1955 (67 y.o. [name] Primary Care Natasa Stigall: Arva Chafe Other Clinician: Referring Lakara Weiland: Treating Tashica Provencio/Extender: [name] in Treatment: 34 Multidisciplinary Care Plan reviewed with physician Active Inactive Abuse / Safety / Falls / Self Care Management Nursing Diagnoses: ZAVIEN, CLUBB A ([id number]) [id number] Page 4 of 7 Impaired physical mobility Potential for falls Goals: Patient will not experience any injury related to falls Date Initiated: 08/31/2022 Target Resolution Date: 06/18/2023 Goal Status: Active Patient/caregiver will verbalize/demonstrate measures taken to improve the patient's personal safety Date Initiated: 08/31/2022 Target Resolution Date: 06/18/2023 Goal Status: Active Interventions: Provide education on basic hygiene Provide education on fall prevention Notes: Wound/Skin Impairment Nursing Diagnoses: Impaired tissue integrity Knowledge deficit related to ulceration/compromised skin integrity Goals: Patient/caregiver will verbalize understanding of skin care regimen Date Initiated: 08/31/2022 Target Resolution Date: 06/18/2023 Goal Status: Active Interventions: Assess patient/caregiver ability to obtain necessary supplies Assess patient/caregiver ability to perform ulcer/skin care regimen upon admission and as needed Assess ulceration(s) every visit Treatment Activities: Skin care regimen initiated : 08/31/2022 Topical wound management initiated : 08/31/2022 Notes: Electronic Signature(s) Signed: 04/27/2023 3:15:01 PM By: Samuella Bruin Entered By: Samuella Bruin on 04/27/2023 09:00:22 -------------------------------------------------------------------------------- Pain  Assessment Details Patient Name: Date of Service: [name] Pian, [name] 04/27/2023 9:15 A M Medical Record Number: [id number] Patient Account Number: [id number] Date of Birth/Sex: Treating RN: August 17, 1955 (67 y.o. M) Primary Care Yen Wandell: Arva Chafe Other Clinician: Referring Ventura Hollenbeck: Treating Algernon Mundie/Extender: [name] in Treatment: 34 Active Problems Location of Pain Severity and Description of Pain Patient Has Paino No Site Locations [place], [place] A ([id number]) [id number] Page 5 of 7 Pain Management and Medication Current Pain Management: Electronic Signature(s) Signed: 04/27/2023 10:37:36 AM By: Dayton Scrape Entered By: Dayton Scrape on 04/27/2023 09:01:29 -------------------------------------------------------------------------------- Patient/Caregiver Education Details Patient Name: Date of Service: [name] RK A. 10/8/2024andnbsp9:15 A M Medical Record Number: [id number] Patient Account Number: [id number] Date of Birth/Gender: Treating RN: 1956/04/13 (67 y.o. [name] Primary Care Physician: Arva Chafe Other Clinician: Referring Physician: Treating Physician/Extender: [name] in Treatment: 56 Education Assessment Education Provided To: Patient Education Topics Provided Wound/Skin Impairment: Methods: Explain/Verbal Responses: Reinforcements needed, State content correctly Electronic Signature(s) Signed: 04/27/2023 3:15:01 PM By: Samuella Bruin Entered By: Samuella Bruin on 04/27/2023 09:00:36 -------------------------------------------------------------------------------- Wound Assessment Details Patient Name: Date of Service: [name] Pian, [name] 04/27/2023 9:15 A M Medical Record Number: [id number] Patient Account Number: [id number] Date of Birth/Sex: Treating

## 2023-04-30 ENCOUNTER — Encounter: Payer: Self-pay | Admitting: Family Medicine

## 2023-04-30 ENCOUNTER — Ambulatory Visit: Payer: Medicare HMO | Admitting: Family Medicine

## 2023-04-30 VITALS — BP 134/80 | HR 74 | Temp 97.8°F | Ht 70.0 in | Wt 265.0 lb

## 2023-04-30 DIAGNOSIS — G4733 Obstructive sleep apnea (adult) (pediatric): Secondary | ICD-10-CM | POA: Diagnosis not present

## 2023-04-30 NOTE — Patient Instructions (Signed)
If you do not hear anything about your referral in the next 1-2 weeks, call our office and ask for an update.  Let us know if you need anything.  
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
## 2023-05-02 ENCOUNTER — Other Ambulatory Visit: Payer: Self-pay | Admitting: Internal Medicine

## 2023-05-04 ENCOUNTER — Encounter (HOSPITAL_BASED_OUTPATIENT_CLINIC_OR_DEPARTMENT_OTHER): Payer: Medicare HMO | Admitting: General Surgery

## 2023-05-04 DIAGNOSIS — E11621 Type 2 diabetes mellitus with foot ulcer: Secondary | ICD-10-CM | POA: Diagnosis not present

## 2023-05-04 NOTE — Progress Notes (Signed)
Referring [name]: Treating [name]/Extender: [name] in Treatment: 35 Active Problems Location of Pain Severity and Description of Pain Patient Has Paino  No Site Locations Rate the pain. Current Pain Level: 0 Pain Management and Medication Current Pain Management: Electronic Signature(s) Signed: 05/04/2023 3:55:41 PM By: Samuella Bruin Entered By: Samuella Bruin on 05/04/2023 09:05:53 -------------------------------------------------------------------------------- Patient/Caregiver Education Details Patient Name: Date of Service: [name]. 10/15/2024andnbsp9:15 [name] Medical Record Number: [id number] Patient Account Number: [id number] Date of Birth/Gender: Treating RN: [birth year] (67 y.o. [name] Primary Care Physician: Arva Chafe Other Clinician: Referring Physician: Treating Physician/Extender: [name] in Treatment: 35 Education Assessment Education Provided To: Patient [name] ([id number]) [id number] Page 6 of 8 Education Topics Provided Wound/Skin Impairment: Methods: Explain/Verbal Responses: Reinforcements needed, State content correctly Electronic Signature(s) Signed: 05/04/2023 3:55:41 PM By: Samuella Bruin Entered By: Samuella Bruin on 05/04/2023 09:30:47 -------------------------------------------------------------------------------- Wound Assessment Details Patient Name: Date of Service: [name] [name]. 05/04/2023 9:15 [name] Medical Record Number: [id number] Patient Account Number: [id number] Date of Birth/Sex: Treating RN: 1955/07/28 (67 y.o. [name] Primary Care Aarya [name]: Arva Chafe Other Clinician: Referring Blair Mesina: Treating Takota Cahalan/Extender: [name] in Treatment: 35 Wound Status Wound Number: 2 Primary Diabetic Wound/Ulcer of the Lower Extremity Etiology: Wound Location: Right, Plantar Foot Wound Open Wounding Event: Gradually Appeared Status: Date Acquired: 07/08/2022 Comorbid Sleep Apnea, Arrhythmia, Coronary Artery Disease, Deep Vein Weeks Of  Treatment: 35 History: Thrombosis, Hypertension, Myocardial Infarction, Peripheral Arterial Clustered Wound: No Disease, Type II Diabetes, Neuropathy Photos Wound Measurements Length: (cm) 0.3 Width: (cm) 0.8 Depth: (cm) 0.2 Area: (cm) 0.188 Volume: (cm) 0.038 % Reduction in Area: 92.6% % Reduction in Volume: 85.1% Epithelialization: Medium (34-66%) Tunneling: Yes Position (o'clock): 9 Maximum Distance: (cm) 0.5 Undermining: No Wound Description Classification: Grade 1 Wound Margin: Distinct, outline attached Exudate Amount: Medium Exudate Type: Serosanguineous Exudate Color: red, brown Foul Odor After Cleansing: No Slough/Fibrino Yes Wound Bed Granulation Amount: Large (67-100%) Exposed Structure Granulation Quality: Red Fascia Exposed: No Necrotic Amount: Small (1-33%) Fat Layer (Subcutaneous Tissue) Exposed: Yes Necrotic Quality: Eschar, Adherent Slough Tendon Exposed: No Muscle Exposed: No Findling, Carmen [name] ([id number]) [id number] Page 7 of 8 Joint Exposed: No Bone Exposed: No Periwound Skin Texture Texture Color No Abnormalities Noted: No No Abnormalities Noted: Yes Callus: Yes Temperature / Pain Temperature: No Abnormality Moisture No Abnormalities Noted: No Dry / Scaly: No Maceration: Yes Treatment Notes Wound #2 (Foot) Wound Laterality: Plantar, Right Cleanser Soap and Water Discharge Instruction: May shower and wash wound with dial antibacterial soap and water prior to dressing change. Wound Cleanser Discharge Instruction: Cleanse the wound with wound cleanser prior to applying [name] clean dressing using gauze sponges, not tissue or cotton balls. Peri-Wound Care Zinc Oxide Ointment 30g tube Discharge Instruction: Apply Zinc Oxide to periwound with each dressing change Topical Gentamicin Discharge Instruction: As directed by physician Mupirocin Ointment Discharge Instruction: Apply Mupirocin (Bactroban) as instructed Primary  Dressing Maxorb Extra Ag+ Alginate Dressing, 2x2 (in/in) Discharge Instruction: Apply to wound bed as instructed Secondary Dressing Woven Gauze Sponge, Non-Sterile 4x4 in Discharge Instruction: Apply over primary dressing as directed. Zetuvit Plus 4x8 in Discharge Instruction: Apply over primary dressing as directed. Secured With [employer], 4.5x3.1 (in/yd) Discharge Instruction: Secure with Kerlix as directed. 33M Medipore H Soft Cloth Surgical T ape, 4 x 10 (in/yd) Discharge Instruction: Secure with tape as directed. Compression Wrap Compression Stockings Add-Ons Electronic Signature(s) Signed: 05/04/2023 3:55:41 PM By:  Referring [name]: Treating [name]/Extender: [name] in Treatment: 35 Active Problems Location of Pain Severity and Description of Pain Patient Has Paino  No Site Locations Rate the pain. Current Pain Level: 0 Pain Management and Medication Current Pain Management: Electronic Signature(s) Signed: 05/04/2023 3:55:41 PM By: Samuella Bruin Entered By: Samuella Bruin on 05/04/2023 09:05:53 -------------------------------------------------------------------------------- Patient/Caregiver Education Details Patient Name: Date of Service: [name]. 10/15/2024andnbsp9:15 [name] Medical Record Number: [id number] Patient Account Number: [id number] Date of Birth/Gender: Treating RN: [birth year] (67 y.o. [name] Primary Care Physician: Arva Chafe Other Clinician: Referring Physician: Treating Physician/Extender: [name] in Treatment: 35 Education Assessment Education Provided To: Patient [name] ([id number]) [id number] Page 6 of 8 Education Topics Provided Wound/Skin Impairment: Methods: Explain/Verbal Responses: Reinforcements needed, State content correctly Electronic Signature(s) Signed: 05/04/2023 3:55:41 PM By: Samuella Bruin Entered By: Samuella Bruin on 05/04/2023 09:30:47 -------------------------------------------------------------------------------- Wound Assessment Details Patient Name: Date of Service: [name] [name]. 05/04/2023 9:15 [name] Medical Record Number: [id number] Patient Account Number: [id number] Date of Birth/Sex: Treating RN: 1955/07/28 (67 y.o. [name] Primary Care Aarya [name]: Arva Chafe Other Clinician: Referring Blair Mesina: Treating Takota Cahalan/Extender: [name] in Treatment: 35 Wound Status Wound Number: 2 Primary Diabetic Wound/Ulcer of the Lower Extremity Etiology: Wound Location: Right, Plantar Foot Wound Open Wounding Event: Gradually Appeared Status: Date Acquired: 07/08/2022 Comorbid Sleep Apnea, Arrhythmia, Coronary Artery Disease, Deep Vein Weeks Of  Treatment: 35 History: Thrombosis, Hypertension, Myocardial Infarction, Peripheral Arterial Clustered Wound: No Disease, Type II Diabetes, Neuropathy Photos Wound Measurements Length: (cm) 0.3 Width: (cm) 0.8 Depth: (cm) 0.2 Area: (cm) 0.188 Volume: (cm) 0.038 % Reduction in Area: 92.6% % Reduction in Volume: 85.1% Epithelialization: Medium (34-66%) Tunneling: Yes Position (o'clock): 9 Maximum Distance: (cm) 0.5 Undermining: No Wound Description Classification: Grade 1 Wound Margin: Distinct, outline attached Exudate Amount: Medium Exudate Type: Serosanguineous Exudate Color: red, brown Foul Odor After Cleansing: No Slough/Fibrino Yes Wound Bed Granulation Amount: Large (67-100%) Exposed Structure Granulation Quality: Red Fascia Exposed: No Necrotic Amount: Small (1-33%) Fat Layer (Subcutaneous Tissue) Exposed: Yes Necrotic Quality: Eschar, Adherent Slough Tendon Exposed: No Muscle Exposed: No Findling, Carmen [name] ([id number]) [id number] Page 7 of 8 Joint Exposed: No Bone Exposed: No Periwound Skin Texture Texture Color No Abnormalities Noted: No No Abnormalities Noted: Yes Callus: Yes Temperature / Pain Temperature: No Abnormality Moisture No Abnormalities Noted: No Dry / Scaly: No Maceration: Yes Treatment Notes Wound #2 (Foot) Wound Laterality: Plantar, Right Cleanser Soap and Water Discharge Instruction: May shower and wash wound with dial antibacterial soap and water prior to dressing change. Wound Cleanser Discharge Instruction: Cleanse the wound with wound cleanser prior to applying [name] clean dressing using gauze sponges, not tissue or cotton balls. Peri-Wound Care Zinc Oxide Ointment 30g tube Discharge Instruction: Apply Zinc Oxide to periwound with each dressing change Topical Gentamicin Discharge Instruction: As directed by physician Mupirocin Ointment Discharge Instruction: Apply Mupirocin (Bactroban) as instructed Primary  Dressing Maxorb Extra Ag+ Alginate Dressing, 2x2 (in/in) Discharge Instruction: Apply to wound bed as instructed Secondary Dressing Woven Gauze Sponge, Non-Sterile 4x4 in Discharge Instruction: Apply over primary dressing as directed. Zetuvit Plus 4x8 in Discharge Instruction: Apply over primary dressing as directed. Secured With [employer], 4.5x3.1 (in/yd) Discharge Instruction: Secure with Kerlix as directed. 33M Medipore H Soft Cloth Surgical T ape, 4 x 10 (in/yd) Discharge Instruction: Secure with tape as directed. Compression Wrap Compression Stockings Add-Ons Electronic Signature(s) Signed: 05/04/2023 3:55:41 PM By:  Calef, Malahki [name] ([id number]) [id number] Page 1 of 8 Visit Report for 05/04/2023 Arrival Information Details Patient Name: Date of Service: [place], [name]. 05/04/2023 9:15 [name] Medical Record Number: [id number] Patient Account Number: [id number] Date of Birth/Sex: Treating RN: [birth year] (67 y.o. [name] Primary Care Tye Vigo: Arva Chafe Other Clinician: Referring Jaylanni Eltringham: Treating Henslee Lottman/Extender: [name] in Treatment: 35 Visit Information History Since Last Visit Added or deleted any medications: No Patient Arrived: Ambulatory Any new allergies or adverse reactions: No Arrival Time: 09:05 Had [name] fall or experienced change in No Accompanied By: self activities of daily living that may affect Transfer Assistance: None risk of falls: Patient Identification Verified: Yes Signs or symptoms of abuse/neglect since last visito No Secondary Verification Process Completed: Yes Hospitalized since last visit: No Patient Requires Transmission-Based Precautions: No Implantable device outside of the clinic excluding No Patient Has Alerts: No cellular tissue based products placed in the center since last visit: Has Dressing in Place as Prescribed: Yes Has Footwear/Offloading in Place as Prescribed: Yes Right: T Contact Cast otal Pain Present Now: No Electronic Signature(s) Signed: 05/04/2023 3:55:41 PM By: Samuella Bruin Entered By: Samuella Bruin on 05/04/2023 09:05:45 -------------------------------------------------------------------------------- Compression Therapy Details Patient Name: Date of Service: [name] [name]. 05/04/2023 9:15 [name] Medical Record Number: [id number] Patient Account Number: [id number] Date of Birth/Sex: Treating RN: [birth year] (67 y.o. [name] Primary Care Amier Hoyt: Arva Chafe Other Clinician: Referring Mykaela Arena: Treating Brailyn Delman/Extender: [name] in Treatment: 35 Compression Therapy Performed for Wound Assessment: NonWound Condition Lymphedema - Left Leg Performed By: Clinician Samuella Bruin, RN Compression Type: Double Layer Post Procedure Diagnosis Same as Pre-procedure Electronic Signature(s) Signed: 05/04/2023 3:55:41 PM By: Samuella Bruin Entered By: Samuella Bruin on 05/04/2023 09:30:29 Bielinski, Goerge [name] ([id number]) [id number] Page 2 of 8 -------------------------------------------------------------------------------- Encounter Discharge Information Details Patient Name: Date of Service: [place], [name]. 05/04/2023 9:15 [name] Medical Record Number: [id number] Patient Account Number: [id number] Date of Birth/Sex: Treating RN: August 23, 1955 (67 y.o. [name] Primary Care Maureen Duesing: Arva Chafe Other Clinician: Referring Granvel Proudfoot: Treating Felesia Stahlecker/Extender: [name] in Treatment: 35 Encounter Discharge Information Items Discharge Condition: Stable Ambulatory Status: Ambulatory Discharge Destination: Home Transportation: Private Auto Accompanied By: self Schedule Follow-up Appointment: Yes Clinical Summary of Care: Patient Declined Electronic Signature(s) Signed: 05/04/2023 3:55:41 PM By: Gelene Mink By: Samuella Bruin on 05/04/2023 09:54:57 -------------------------------------------------------------------------------- Lower Extremity Assessment Details Patient Name: Date of Service: [name] [name]. 05/04/2023 9:15 [name] Medical Record Number: [id number] Patient Account Number: [id number] Date of Birth/Sex: Treating RN: 12/08/1955 (67 y.o. [name] Primary Care Kanna Dafoe: Arva Chafe Other Clinician: Referring Markale Birdsell: Treating Ronique Simerly/Extender: [name] in Treatment: 35 Edema Assessment Assessed: [name]: No] [name]: No] Edema:  [Left: Ye] [Right: s] Calf Left: Right: Point of Measurement: From Medial Instep 42 cm 39 cm Ankle Left: Right: Point of Measurement: From Medial Instep 30 cm 29 cm Vascular Assessment Pulses: Dorsalis Pedis Palpable: [Left:Yes] [Right:Yes] Extremity colors, hair growth, and conditions: Extremity Color: [Left:Red] [Right:Normal] Hair Growth on Extremity: [Left:No] [Right:Yes] Temperature of Extremity: [Left:Warm] [Right:Warm] Capillary Refill: [Left:< 3 seconds] [Right:< 3 seconds] Dependent Rubor: [Left:No] [Right:No] Blanched when Elevated: [Left:No] Lipodermatosclerosis: [Left:No] [Right:No] Vowels, Amine [name] ([id number]) [Right:[id number].pdf Page 3 of 8] Electronic Signature(s) Signed: 05/04/2023 3:55:41 PM By: Gelene Mink By: Samuella Bruin on 05/04/2023 09:22:22 -------------------------------------------------------------------------------- Multi Wound Chart Details Patient Name: Date of Service: Gebert, [name] [name]. 05/04/2023 9:15  [name] Medical Record Number: [id number] Patient Account Number: [id number] Date of Birth/Sex: Treating RN: 04/30/1956 (67 y.o. M) Primary Care Arcadia Gorgas: Arva Chafe Other Clinician: Referring Kendarious Gudino: Treating Jaheim Canino/Extender: [name] in Treatment: 35 Vital Signs Height(in): 70 Pulse(bpm): 51 Weight(lbs): 260 Blood Pressure(mmHg): 189/65 Body Mass Index(BMI): 37.3 Temperature(F): 97.7 Respiratory Rate(breaths/min): 16 [2:Photos:] [[name]:[name]] Right, Plantar Foot [name] [name] Wound Location: Gradually Appeared [name] [name] Wounding Event: Diabetic Wound/Ulcer of the Lower [name] [name] Primary Etiology: Extremity Sleep Apnea, Arrhythmia, Coronary [name] [name] Comorbid History: Artery Disease, Deep Vein Thrombosis, Hypertension, Myocardial Infarction, Peripheral Arterial Disease, Type II Diabetes, Neuropathy 07/08/2022 [name] [name] Date Acquired: 35 [name] [name] Weeks of Treatment: Open  [name] [name] Wound Status: No [name] [name] Wound Recurrence: 0.3x0.8x0.2 [name] [name] Measurements L x W x D (cm) 0.188 [name] [name] [name] : rea 0.038 [name] [name] Volume (cm) : 92.60% [name] [name] % Reduction in [name] rea: 85.10% [name] [name] % Reduction in Volume: 9 Position 1 (o'clock): 0.5 Maximum Distance 1 (cm): Yes [name] [name] Tunneling: Grade 1 [name] [name] Classification: Medium [name] [name] Exudate [name] mount: Serosanguineous [name] [name] Exudate Type: red, brown [name] [name] Exudate Color: Distinct, outline attached [name] [name] Wound Margin: Large (67-100%) [name] [name] Granulation [name] mount: Red [name] [name] Granulation Quality: Small (1-33%) [name] [name] Necrotic [name] mount: Eschar, Adherent Slough [name] [name] Necrotic Tissue: Fat Layer (Subcutaneous Tissue): Yes [name] [name] Exposed Structures: Fascia: No Tendon: No Muscle: No Joint: No Bone: No Medium (34-66%) [name] [name] Epithelialization: Callus: Yes [name] [name] Periwound Skin Texture: Maceration: Yes [name] [name] Periwound Skin Moisture: Dry/Scaly: No Weaber, Dagan [name] ([id number]) [id number].pdf Page 4 of 8 No Abnormalities Noted [name] [name] Periwound Skin Color: No Abnormality [name] [name] Temperature: T Contact Cast otal [name] [name] Procedures Performed: Treatment Notes Electronic Signature(s) Signed: 05/04/2023 9:34:54 AM By: Duanne Guess MD FACS Entered By: Duanne Guess on 05/04/2023 09:34:53 -------------------------------------------------------------------------------- Multi-Disciplinary Care Plan Details Patient Name: Date of Service: [name] [name]. 05/04/2023 9:15 [name] Medical Record Number: [id number] Patient Account Number: [id number] Date of Birth/Sex: Treating RN: 10/15/1955 (67 y.o. [name] Primary Care Kamaal Cast: Arva Chafe Other Clinician: Referring Kyzer Blowe: Treating Nick Stults/Extender: [name] in Treatment: 35 Multidisciplinary Care Plan reviewed with physician Active Inactive Abuse / Safety / Falls  / Self Care Management Nursing Diagnoses: Impaired physical mobility Potential for falls Goals: Patient will not experience any injury related to falls Date Initiated: 08/31/2022 Target Resolution Date: 06/18/2023 Goal Status: Active Patient/caregiver will verbalize/demonstrate measures taken to improve the patient's personal safety Date Initiated: 08/31/2022 Target Resolution Date: 06/18/2023 Goal Status: Active Interventions: Provide education on basic hygiene Provide education on fall prevention Notes: Wound/Skin Impairment Nursing Diagnoses: Impaired tissue integrity Knowledge deficit related to ulceration/compromised skin integrity Goals: Patient/caregiver will verbalize understanding of skin care regimen Date Initiated: 08/31/2022 Target Resolution Date: 06/18/2023 Goal Status: Active Interventions: Assess patient/caregiver ability to obtain necessary supplies Assess patient/caregiver ability to perform ulcer/skin care regimen upon admission and as needed Assess ulceration(s) every visit Treatment Activities: Skin care regimen initiated : 08/31/2022 Topical wound management initiated : 08/31/2022 Notes: GEROGE, GILLIAM [name] ([id number]) [phone].pdf Page 5 of 8 Electronic Signature(s) Signed: 05/04/2023 3:55:41 PM By: Gelene Mink By: Samuella Bruin on 05/04/2023 09:30:35 -------------------------------------------------------------------------------- Pain Assessment Details Patient Name: Date of Service: [name], [name]. 05/04/2023 9:15 [name] Medical Record Number: [id number] Patient Account Number: [id number] Date of Birth/Sex: Treating RN: 06/19/1956 (67 y.o. [name] Primary Care Ava Tangney: Arva Chafe Other Clinician:

## 2023-05-04 NOTE — Progress Notes (Signed)
H Soft Cloth Surgical T ape, 4 x 10 (in/yd) 1 x Per Week/30 Days Discharge Instructions: Secure with tape as directed. 05/04/2023: The wound is stable this week. He has had an increase in his left lower leg swelling, however, and he has an intact blister on the distal medial aspect of the leg, adjacent to where his saphenous vein was harvested. The wound  was prepared for total contact casting. Topical gentamicin and mupirocin were applied, followed by silver alginate. A total contact cast procedure was performed in standard fashion. We are also going to apply some silver alginate over the blister on his left leg and apply Urgo light compression. Follow- up in 1 week. Electronic Signature(s) Signed: 05/04/2023 9:37:59 AM By: Duanne Guess MD FACS Entered By: Duanne Guess on 05/04/2023 09:37:58 -------------------------------------------------------------------------------- HxROS Details Patient Name: Date of Service: [name], [name] 05/04/2023 9:15 A M Medical Record Number: [id number] Patient Account Number: [id number] Date of Birth/Sex: Treating RN: [birth year] (67 y.o. M) Primary Care Provider: Arva Chafe Other Clinician: Referring Provider: Treating Provider/Extender: [name] in Treatment: 35 Information Obtained From Patient Respiratory Medical History: Positive for: Sleep Apnea Cardiovascular Medical History: Positive for: Arrhythmia - A-Fib; Coronary Artery Disease - s/p CABG; Deep Vein Thrombosis; Hypertension; Myocardial Infarction; Peripheral Arterial Disease - s/p Fempop x 2 Past Medical History Notes: CVA x 3 Endocrine Medical HistoryLAWERANCE, MATSUO A ([id number]) [id number] Page 10 of 11 Positive for: Type II Diabetes Time with diabetes: since 1997 Treated with: Insulin, Oral agents Blood sugar tested every day: Yes Tested : 2-3x per day Musculoskeletal Medical History: Past Medical History Notes: carpal tunnel syndrome Neurologic Medical History: Positive for: Neuropathy Past Medical History Notes: stroke Oncologic Medical History: Past Medical History Notes: skin cancer Immunizations Pneumococcal Vaccine: Received Pneumococcal Vaccination: Yes Received Pneumococcal Vaccination On or After 60th Birthday: Yes Implantable  Devices None Hospitalization / Surgery History Type of Hospitalization/Surgery colonoscopy polypectomy peripheral vascular cath shoulder arthroscopy carpal tunnel release coronary artery bypass appendectomy cardiac cath coronary angioplasty thrombectomy knee arthroplasty popliteal artery stent Family and Social History Unknown History: Yes; Never smoker; Marital Status - Single; Alcohol Use: Never; Drug Use: No History; Caffeine Use: Rarely; Financial Concerns: No; Food, Clothing or Shelter Needs: No; Support System Lacking: No; Transportation Concerns: No Electronic Signature(s) Signed: 05/04/2023 10:00:44 AM By: Duanne Guess MD FACS Entered By: Duanne Guess on 05/04/2023 09:36:11 -------------------------------------------------------------------------------- Total Contact Cast Details Patient Name: Date of Service: [name] RK A. 05/04/2023 9:15 A M Medical Record Number: [id number] Patient Account Number: [id number] Date of Birth/Sex: Treating RN: [birth year] (67 y.o. [name] Primary Care Provider: Arva Chafe Other Clinician: Referring Provider: Treating Provider/Extender: [name] in Treatment: 35 T Contact Cast Applied for Wound Assessment: otal Wound #2 Right,Plantar Foot Performed By: Physician Duanne Guess, MD The following information was scribed by: Karleen Dolphin, Khaleed A ([id number]) [id number] Page 11 of 11 The information was scribed for: Duanne Guess Post Procedure Diagnosis Same as Pre-procedure Electronic Signature(s) Signed: 05/04/2023 10:00:44 AM By: Duanne Guess MD FACS Signed: 05/04/2023 3:55:41 PM By: Gelene Mink By: Samuella Bruin on 05/04/2023 09:28:49 -------------------------------------------------------------------------------- SuperBill Details Patient Name: Date of Service: [name], [name] 05/04/2023 Medical  Record Number: [id number] Patient Account Number: [id number] Date of Birth/Sex: Treating RN: 03/07/1956 (67 y.o. M) Primary Care Provider: Arva Chafe Other Clinician: Referring Provider: Treating Provider/Extender: [name] in Treatment: 35 Diagnosis Coding ICD-10 Codes Code Description [phone] Non-pressure chronic ulcer of  Page 3 of 11 05/04/2023: The wound is stable this week. He has had an increase in his left lower leg swelling, however, and he has an intact blister on the distal medial aspect of the leg, adjacent to where his saphenous vein was harvested. Electronic Signature(s) Signed: 05/04/2023 9:35:55 AM By: Duanne Guess MD FACS Entered By: Duanne Guess on 05/04/2023 09:35:54 -------------------------------------------------------------------------------- Physical Exam Details Patient Name: Date of Service: [name], [name] 05/04/2023 9:15  A M Medical Record Number: [id number] Patient Account Number: [id number] Date of Birth/Sex: Treating RN: [birth year] (67 y.o. M) Primary Care Provider: Arva Chafe Other Clinician: Referring Provider: Treating Provider/Extender: [name] in Treatment: 35 Constitutional Hypertensive, asymptomatic. Bradycardic, asymptomatic. . . no acute distress. Respiratory Normal work of breathing on room air. Notes 05/04/2023: The wound is stable this week. He has had an increase in his left lower leg swelling, however, and he has an intact blister on the distal medial aspect of the leg, adjacent to where his saphenous vein was harvested. Electronic Signature(s) Signed: 05/04/2023 9:36:41 AM By: Duanne Guess MD FACS Entered By: Duanne Guess on 05/04/2023 09:36:41 -------------------------------------------------------------------------------- Physician Orders Details Patient Name: Date of Service: [name], [name] 05/04/2023 9:15 A M Medical Record Number: [id number] Patient Account Number: [id number] Date of Birth/Sex: Treating RN: 1956/01/15 (67 y.o. [name] Primary Care Provider: Arva Chafe Other Clinician: Referring Provider: Treating Provider/Extender: [name] in Treatment: 35 The following information was scribed by: Samuella Bruin The information was scribed for: Duanne Guess Verbal / Phone Orders: No Diagnosis Coding ICD-10 Coding Code Description [phone] Non-pressure chronic ulcer of other part of right foot with fat layer exposed R60.0 Localized edema I73.9 Peripheral vascular disease, unspecified I25.10 Atherosclerotic heart disease of native coronary artery without angina pectoris I10 Essential (primary) hypertension E11.65 Type 2 diabetes mellitus with hyperglycemia E11.40 Type 2 diabetes mellitus with diabetic neuropathy, unspecified I63.9 Cerebral infarction,  unspecified Kowalczyk, Daryll A ([id number]) [id number] Page 4 of 11 Follow-up Appointments ppointment in 1 week. - Dr. Lady Gary - room 2 TCC Return A Anesthetic (In clinic) Topical Lidocaine 5% applied to wound bed Bathing/ Shower/ Hygiene May shower with protection but do not get wound dressing(s) wet. Protect dressing(s) with water repellant cover (for example, large plastic bag) or a cast cover and may then take shower. Edema Control - Lymphedema / SCD / Other Elevate legs to the level of the heart or above for 30 minutes daily and/or when sitting for 3-4 times a day throughout the day. Avoid standing for long periods of time. Patient to wear own compression stockings every day. Moisturize legs daily. Off-Loading Total Contact Cast to Right Lower Extremity Removable cast walker boot to: - right leg Non Wound Condition Left Lower Extremity Other Non Wound Condition Orders/Instructions: - compression wrap urgo lite to left leg Wound Treatment Wound #2 - Foot Wound Laterality: Plantar, Right Cleanser: Soap and Water 1 x Per Week/30 Days Discharge Instructions: May shower and wash wound with dial antibacterial soap and water prior to dressing change. Cleanser: Wound Cleanser 1 x Per Week/30 Days Discharge Instructions: Cleanse the wound with wound cleanser prior to applying a clean dressing using gauze sponges, not tissue or cotton balls. Peri-Wound Care: Zinc Oxide Ointment 30g tube 1 x Per Week/30 Days Discharge Instructions: Apply Zinc Oxide to periwound with each dressing change Topical: Gentamicin 1 x Per Week/30 Days Discharge Instructions: As directed by physician Topical: Mupirocin Ointment 1 x Per Week/30 Days Discharge Instructions:  Apply Mupirocin (Bactroban) as instructed Prim Dressing: Maxorb Extra Ag+ Alginate Dressing, 2x2 (in/in) 1 x Per Week/30 Days ary Discharge Instructions: Apply to wound bed as instructed Secondary Dressing: Woven Gauze  Sponge, Non-Sterile 4x4 in 1 x Per Week/30 Days Discharge Instructions: Apply over primary dressing as directed. Secondary Dressing: Zetuvit Plus 4x8 in 1 x Per Week/30 Days Discharge Instructions: Apply over primary dressing as directed. Secured With: [employer], 4.5x3.1 (in/yd) 1 x Per Week/30 Days Discharge Instructions: Secure with Kerlix as directed. Secured With: 24M Medipore H Soft Cloth Surgical T ape, 4 x 10 (in/yd) 1 x Per Week/30 Days Discharge Instructions: Secure with tape as directed. Patient Medications llergies: No Known Drug Allergies A Notifications Medication Indication Start End 05/04/2023 lidocaine DOSE topical 5 % ointment - ointment topical Electronic Signature(s) Signed: 05/04/2023 10:00:44 AM By: Duanne Guess MD FACS Entered By: Duanne Guess on 05/04/2023 09:37:09 Fennewald, Jacy A ([id number]) [id number] Page 5 of 11 -------------------------------------------------------------------------------- Problem List Details Patient Name: Date of Service: PATIL, [name] 05/04/2023 9:15 A M Medical Record Number: [id number] Patient Account Number: [id number] Date of Birth/Sex: Treating RN: 1956/07/13 (67 y.o. M) Primary Care Provider: Arva Chafe Other Clinician: Referring Provider: Treating Provider/Extender: [name] in Treatment: 35 Active Problems ICD-10 Encounter Code Description Active Date MDM Diagnosis L97.512 Non-pressure chronic ulcer of other part of right foot with fat layer exposed 08/31/2022 No Yes R60.0 Localized edema 05/04/2023 No Yes I73.9 Peripheral vascular disease, unspecified 08/31/2022 No Yes I25.10 Atherosclerotic heart disease of native coronary artery without angina pectoris 08/31/2022 No Yes I10 Essential (primary) hypertension 08/31/2022 No Yes E11.65 Type 2 diabetes mellitus with hyperglycemia 08/31/2022 No Yes E11.40 Type 2 diabetes mellitus with diabetic  neuropathy, unspecified 08/31/2022 No Yes I63.9 Cerebral infarction, unspecified 08/31/2022 No Yes Inactive Problems Resolved Problems ICD-10 Code Description Active Date Resolved Date L97.821 Non-pressure chronic ulcer of other part of left lower leg limited to breakdown of skin 11/05/2022 11/05/2022 Electronic Signature(s) Signed: 05/04/2023 9:33:39 AM By: Duanne Guess MD FACS Entered By: Duanne Guess on 05/04/2023 09:33:38 Casebolt, Khaleed A ([id number]) [id number] Page 6 of 11 -------------------------------------------------------------------------------- Progress Note Details Patient Name: Date of Service: [place]CE[place], [name] 05/04/2023 9:15 A M Medical Record Number: [id number] Patient Account Number: [id number] Date of Birth/Sex: Treating RN: [birth year] (67 y.o. M) Primary Care Provider: Arva Chafe Other Clinician: Referring Provider: Treating Provider/Extender: [name] in Treatment: 35 Subjective Chief Complaint Information obtained from Patient 05/14/2020; patient is here for review of an abrasion injury on the left lateral calf 08/31/2022: [name] (1st metatarsal base, plantar) History of Present Illness (HPI) ADMISSION 05/14/2020; this is a 67 year old man with multiple medical issues. Predominantly he has type 2 diabetes with a history of peripheral neuropathy and also history of fairly significant PAD. He had a left superficial femoral to posterior tibial artery bypass in February 2017 he also had an atherectomy and angioplasty by Dr. [name] of the right popliteal artery in 2016. He is supposed to be getting arterial studies annually however this was interrupted last year because of the pandemic. He tells [place] he was at [facility] 2 weeks ago was getting out of of the scooter and traumatized his left lateral lower leg. There was a lot of bleeding as the patient is on Plavix and Eliquis. They have been  dressing this with Neosporin and doing a fairly good job. Wound measures 2.5 x 3.5 it does not have any depth he does not have  H Soft Cloth Surgical T ape, 4 x 10 (in/yd) 1 x Per Week/30 Days Discharge Instructions: Secure with tape as directed. 05/04/2023: The wound is stable this week. He has had an increase in his left lower leg swelling, however, and he has an intact blister on the distal medial aspect of the leg, adjacent to where his saphenous vein was harvested. The wound  was prepared for total contact casting. Topical gentamicin and mupirocin were applied, followed by silver alginate. A total contact cast procedure was performed in standard fashion. We are also going to apply some silver alginate over the blister on his left leg and apply Urgo light compression. Follow- up in 1 week. Electronic Signature(s) Signed: 05/04/2023 9:37:59 AM By: Duanne Guess MD FACS Entered By: Duanne Guess on 05/04/2023 09:37:58 -------------------------------------------------------------------------------- HxROS Details Patient Name: Date of Service: [name], [name] 05/04/2023 9:15 A M Medical Record Number: [id number] Patient Account Number: [id number] Date of Birth/Sex: Treating RN: [birth year] (67 y.o. M) Primary Care Provider: Arva Chafe Other Clinician: Referring Provider: Treating Provider/Extender: [name] in Treatment: 35 Information Obtained From Patient Respiratory Medical History: Positive for: Sleep Apnea Cardiovascular Medical History: Positive for: Arrhythmia - A-Fib; Coronary Artery Disease - s/p CABG; Deep Vein Thrombosis; Hypertension; Myocardial Infarction; Peripheral Arterial Disease - s/p Fempop x 2 Past Medical History Notes: CVA x 3 Endocrine Medical HistoryLAWERANCE, MATSUO A ([id number]) [id number] Page 10 of 11 Positive for: Type II Diabetes Time with diabetes: since 1997 Treated with: Insulin, Oral agents Blood sugar tested every day: Yes Tested : 2-3x per day Musculoskeletal Medical History: Past Medical History Notes: carpal tunnel syndrome Neurologic Medical History: Positive for: Neuropathy Past Medical History Notes: stroke Oncologic Medical History: Past Medical History Notes: skin cancer Immunizations Pneumococcal Vaccine: Received Pneumococcal Vaccination: Yes Received Pneumococcal Vaccination On or After 60th Birthday: Yes Implantable  Devices None Hospitalization / Surgery History Type of Hospitalization/Surgery colonoscopy polypectomy peripheral vascular cath shoulder arthroscopy carpal tunnel release coronary artery bypass appendectomy cardiac cath coronary angioplasty thrombectomy knee arthroplasty popliteal artery stent Family and Social History Unknown History: Yes; Never smoker; Marital Status - Single; Alcohol Use: Never; Drug Use: No History; Caffeine Use: Rarely; Financial Concerns: No; Food, Clothing or Shelter Needs: No; Support System Lacking: No; Transportation Concerns: No Electronic Signature(s) Signed: 05/04/2023 10:00:44 AM By: Duanne Guess MD FACS Entered By: Duanne Guess on 05/04/2023 09:36:11 -------------------------------------------------------------------------------- Total Contact Cast Details Patient Name: Date of Service: [name] RK A. 05/04/2023 9:15 A M Medical Record Number: [id number] Patient Account Number: [id number] Date of Birth/Sex: Treating RN: [birth year] (67 y.o. [name] Primary Care Provider: Arva Chafe Other Clinician: Referring Provider: Treating Provider/Extender: [name] in Treatment: 35 T Contact Cast Applied for Wound Assessment: otal Wound #2 Right,Plantar Foot Performed By: Physician Duanne Guess, MD The following information was scribed by: Karleen Dolphin, Khaleed A ([id number]) [id number] Page 11 of 11 The information was scribed for: Duanne Guess Post Procedure Diagnosis Same as Pre-procedure Electronic Signature(s) Signed: 05/04/2023 10:00:44 AM By: Duanne Guess MD FACS Signed: 05/04/2023 3:55:41 PM By: Gelene Mink By: Samuella Bruin on 05/04/2023 09:28:49 -------------------------------------------------------------------------------- SuperBill Details Patient Name: Date of Service: [name], [name] 05/04/2023 Medical  Record Number: [id number] Patient Account Number: [id number] Date of Birth/Sex: Treating RN: 03/07/1956 (67 y.o. M) Primary Care Provider: Arva Chafe Other Clinician: Referring Provider: Treating Provider/Extender: [name] in Treatment: 35 Diagnosis Coding ICD-10 Codes Code Description [id number] Non-pressure chronic ulcer of  IBN, STIEF A ([id number]) [id number] Page 1 of 11 Visit Report for 05/04/2023 Chief Complaint Document Details Patient Name: Date of Service: [place], [name] 05/04/2023 9:15 A M Medical Record Number: [id number] Patient Account Number: [id number] Date of Birth/Sex: Treating RN: 09-07-1955 (67 y.o. M) Primary Care Provider: Arva Chafe Other Clinician: Referring Provider: Treating Provider/Extender: [name] in Treatment: 35 Information Obtained from: Patient Chief Complaint 05/14/2020; patient is here for review of an abrasion injury on the left lateral calf 08/31/2022: [name] (1st metatarsal base, plantar) Electronic Signature(s) Signed: 05/04/2023 9:35:04 AM By: Duanne Guess MD FACS Entered By: Duanne Guess on 05/04/2023 09:35:04 -------------------------------------------------------------------------------- HPI Details Patient Name: Date of Service: [name], [name] 05/04/2023 9:15 A M Medical Record Number: [id number] Patient Account Number: [id number] Date of Birth/Sex: Treating RN: 03-21-1956 (67 y.o. M) Primary Care Provider: Arva Chafe Other Clinician: Referring Provider: Treating Provider/Extender: [name] in Treatment: 35 History of Present Illness HPI Description: ADMISSION 05/14/2020; this is a 67 year old man with multiple medical issues. Predominantly he has type 2 diabetes with a history of peripheral neuropathy and also history of fairly significant PAD. He had a left superficial femoral to posterior tibial artery bypass in February 2017 he also had an atherectomy and angioplasty by Dr. [name] of the right popliteal artery in 2016. He is supposed to be getting arterial studies annually however this was interrupted last year because of the pandemic. He tells [place] he was at [facility] 2 weeks ago was getting out of of the scooter and traumatized his left  lateral lower leg. There was a lot of bleeding as the patient is on Plavix and Eliquis. They have been dressing this with Neosporin and doing a fairly good job. Wound measures 2.5 x 3.5 it does not have any depth he does not have a wound history in his legs outside of surgery however he does have chronic edema and skin changes suggestive of chronic venous disease possibly some degree of lymphedema as well. Past medical history includes type 2 diabetes with peripheral neuropathy and gait instability, lumbar spondylosis, obstructive sleep apnea, history of a left pontine CVA, basal cell skin cancer, atrial fibrillation on anticoagulation, significant PAD as noted with a left superficial femoral to posterior tibial bypass in February 2017 and a right popliteal atherectomy and angioplasty by Dr. [name] in 30th 2016. He also has a history of coronary artery disease with an MI in 2002 hypertension hyperlipidemia and heart failure with preserved ejection fraction His last arterial studies I can see in epic were on 03/10/2018 this showed a right ABI of 0.69 and a right TBI of 0.5 with monophasic waveforms on the right. On the left his ABI was 1.20 with a TBI of 0.92 and triphasic waveforms. He has not had arterial studies since. Our nurse in the clinic got an ABI on the left of 1.1 11/2; left anterior leg wound in the setting of chronic venous insufficiency. Wound was initially trauma. We have been using Hydrofera Blue under compression he has home health.. The wound looks a lot better today with improvement in surface area 11/16; left anterior leg wound in the setting of chronic venous insufficiency. Wound was initially trauma. We have been using Hydrofera Blue under compression. The patient is closed today. He is supposed to follow-up with vein and vascular with regards to arterial insufficiency nevertheless his leg wounds are 12/3; apparently 2 weeks ago when they were putting on their stockings they managed  Apply Mupirocin (Bactroban) as instructed Prim Dressing: Maxorb Extra Ag+ Alginate Dressing, 2x2 (in/in) 1 x Per Week/30 Days ary Discharge Instructions: Apply to wound bed as instructed Secondary Dressing: Woven Gauze  Sponge, Non-Sterile 4x4 in 1 x Per Week/30 Days Discharge Instructions: Apply over primary dressing as directed. Secondary Dressing: Zetuvit Plus 4x8 in 1 x Per Week/30 Days Discharge Instructions: Apply over primary dressing as directed. Secured With: [employer], 4.5x3.1 (in/yd) 1 x Per Week/30 Days Discharge Instructions: Secure with Kerlix as directed. Secured With: 24M Medipore H Soft Cloth Surgical T ape, 4 x 10 (in/yd) 1 x Per Week/30 Days Discharge Instructions: Secure with tape as directed. Patient Medications llergies: No Known Drug Allergies A Notifications Medication Indication Start End 05/04/2023 lidocaine DOSE topical 5 % ointment - ointment topical Electronic Signature(s) Signed: 05/04/2023 10:00:44 AM By: Duanne Guess MD FACS Entered By: Duanne Guess on 05/04/2023 09:37:09 Fennewald, Jacy A ([id number]) [id number] Page 5 of 11 -------------------------------------------------------------------------------- Problem List Details Patient Name: Date of Service: PATIL, [name] 05/04/2023 9:15 A M Medical Record Number: [id number] Patient Account Number: [id number] Date of Birth/Sex: Treating RN: 1956/07/13 (67 y.o. M) Primary Care Provider: Arva Chafe Other Clinician: Referring Provider: Treating Provider/Extender: [name] in Treatment: 35 Active Problems ICD-10 Encounter Code Description Active Date MDM Diagnosis L97.512 Non-pressure chronic ulcer of other part of right foot with fat layer exposed 08/31/2022 No Yes R60.0 Localized edema 05/04/2023 No Yes I73.9 Peripheral vascular disease, unspecified 08/31/2022 No Yes I25.10 Atherosclerotic heart disease of native coronary artery without angina pectoris 08/31/2022 No Yes I10 Essential (primary) hypertension 08/31/2022 No Yes E11.65 Type 2 diabetes mellitus with hyperglycemia 08/31/2022 No Yes E11.40 Type 2 diabetes mellitus with diabetic  neuropathy, unspecified 08/31/2022 No Yes I63.9 Cerebral infarction, unspecified 08/31/2022 No Yes Inactive Problems Resolved Problems ICD-10 Code Description Active Date Resolved Date L97.821 Non-pressure chronic ulcer of other part of left lower leg limited to breakdown of skin 11/05/2022 11/05/2022 Electronic Signature(s) Signed: 05/04/2023 9:33:39 AM By: Duanne Guess MD FACS Entered By: Duanne Guess on 05/04/2023 09:33:38 Casebolt, Khaleed A ([id number]) [id number] Page 6 of 11 -------------------------------------------------------------------------------- Progress Note Details Patient Name: Date of Service: PARCELL, [name] 05/04/2023 9:15 A M Medical Record Number: [id number] Patient Account Number: [id number] Date of Birth/Sex: Treating RN: [birth year] (67 y.o. M) Primary Care Provider: Arva Chafe Other Clinician: Referring Provider: Treating Provider/Extender: [name] in Treatment: 35 Subjective Chief Complaint Information obtained from Patient 05/14/2020; patient is here for review of an abrasion injury on the left lateral calf 08/31/2022: [name] (1st metatarsal base, plantar) History of Present Illness (HPI) ADMISSION 05/14/2020; this is a 67 year old man with multiple medical issues. Predominantly he has type 2 diabetes with a history of peripheral neuropathy and also history of fairly significant PAD. He had a left superficial femoral to posterior tibial artery bypass in February 2017 he also had an atherectomy and angioplasty by Dr. [name] of the right popliteal artery in 2016. He is supposed to be getting arterial studies annually however this was interrupted last year because of the pandemic. He tells [place] he was at [facility] 2 weeks ago was getting out of of the scooter and traumatized his left lateral lower leg. There was a lot of bleeding as the patient is on Plavix and Eliquis. They have been  dressing this with Neosporin and doing a fairly good job. Wound measures 2.5 x 3.5 it does not have any depth he does not have  IBN, STIEF A ([id number]) [id number] Page 1 of 11 Visit Report for 05/04/2023 Chief Complaint Document Details Patient Name: Date of Service: [place], [name] 05/04/2023 9:15 A M Medical Record Number: [id number] Patient Account Number: [id number] Date of Birth/Sex: Treating RN: 09-07-1955 (67 y.o. M) Primary Care Provider: Arva Chafe Other Clinician: Referring Provider: Treating Provider/Extender: [name] in Treatment: 35 Information Obtained from: Patient Chief Complaint 05/14/2020; patient is here for review of an abrasion injury on the left lateral calf 08/31/2022: [name] (1st metatarsal base, plantar) Electronic Signature(s) Signed: 05/04/2023 9:35:04 AM By: Duanne Guess MD FACS Entered By: Duanne Guess on 05/04/2023 09:35:04 -------------------------------------------------------------------------------- HPI Details Patient Name: Date of Service: [name], [name] 05/04/2023 9:15 A M Medical Record Number: [id number] Patient Account Number: [id number] Date of Birth/Sex: Treating RN: 03-21-1956 (67 y.o. M) Primary Care Provider: Arva Chafe Other Clinician: Referring Provider: Treating Provider/Extender: [name] in Treatment: 35 History of Present Illness HPI Description: ADMISSION 05/14/2020; this is a 67 year old man with multiple medical issues. Predominantly he has type 2 diabetes with a history of peripheral neuropathy and also history of fairly significant PAD. He had a left superficial femoral to posterior tibial artery bypass in February 2017 he also had an atherectomy and angioplasty by Dr. [name] of the right popliteal artery in 2016. He is supposed to be getting arterial studies annually however this was interrupted last year because of the pandemic. He tells [place] he was at [facility] 2 weeks ago was getting out of of the scooter and traumatized his left  lateral lower leg. There was a lot of bleeding as the patient is on Plavix and Eliquis. They have been dressing this with Neosporin and doing a fairly good job. Wound measures 2.5 x 3.5 it does not have any depth he does not have a wound history in his legs outside of surgery however he does have chronic edema and skin changes suggestive of chronic venous disease possibly some degree of lymphedema as well. Past medical history includes type 2 diabetes with peripheral neuropathy and gait instability, lumbar spondylosis, obstructive sleep apnea, history of a left pontine CVA, basal cell skin cancer, atrial fibrillation on anticoagulation, significant PAD as noted with a left superficial femoral to posterior tibial bypass in February 2017 and a right popliteal atherectomy and angioplasty by Dr. [name] in 30th 2016. He also has a history of coronary artery disease with an MI in 2002 hypertension hyperlipidemia and heart failure with preserved ejection fraction His last arterial studies I can see in epic were on 03/10/2018 this showed a right ABI of 0.69 and a right TBI of 0.5 with monophasic waveforms on the right. On the left his ABI was 1.20 with a TBI of 0.92 and triphasic waveforms. He has not had arterial studies since. Our nurse in the clinic got an ABI on the left of 1.1 11/2; left anterior leg wound in the setting of chronic venous insufficiency. Wound was initially trauma. We have been using Hydrofera Blue under compression he has home health.. The wound looks a lot better today with improvement in surface area 11/16; left anterior leg wound in the setting of chronic venous insufficiency. Wound was initially trauma. We have been using Hydrofera Blue under compression. The patient is closed today. He is supposed to follow-up with vein and vascular with regards to arterial insufficiency nevertheless his leg wounds are 12/3; apparently 2 weeks ago when they were putting on their stockings they managed  Apply Mupirocin (Bactroban) as instructed Prim Dressing: Maxorb Extra Ag+ Alginate Dressing, 2x2 (in/in) 1 x Per Week/30 Days ary Discharge Instructions: Apply to wound bed as instructed Secondary Dressing: Woven Gauze  Sponge, Non-Sterile 4x4 in 1 x Per Week/30 Days Discharge Instructions: Apply over primary dressing as directed. Secondary Dressing: Zetuvit Plus 4x8 in 1 x Per Week/30 Days Discharge Instructions: Apply over primary dressing as directed. Secured With: [employer], 4.5x3.1 (in/yd) 1 x Per Week/30 Days Discharge Instructions: Secure with Kerlix as directed. Secured With: 24M Medipore H Soft Cloth Surgical T ape, 4 x 10 (in/yd) 1 x Per Week/30 Days Discharge Instructions: Secure with tape as directed. Patient Medications llergies: No Known Drug Allergies A Notifications Medication Indication Start End 05/04/2023 lidocaine DOSE topical 5 % ointment - ointment topical Electronic Signature(s) Signed: 05/04/2023 10:00:44 AM By: Duanne Guess MD FACS Entered By: Duanne Guess on 05/04/2023 09:37:09 Fennewald, Jacy A ([id number]) [id number] Page 5 of 11 -------------------------------------------------------------------------------- Problem List Details Patient Name: Date of Service: [place], [name] 05/04/2023 9:15 A M Medical Record Number: [id number] Patient Account Number: [id number] Date of Birth/Sex: Treating RN: 1956/07/13 (67 y.o. M) Primary Care Provider: Arva Chafe Other Clinician: Referring Provider: Treating Provider/Extender: [name] in Treatment: 35 Active Problems ICD-10 Encounter Code Description Active Date MDM Diagnosis L97.512 Non-pressure chronic ulcer of other part of right foot with fat layer exposed 08/31/2022 No Yes R60.0 Localized edema 05/04/2023 No Yes I73.9 Peripheral vascular disease, unspecified 08/31/2022 No Yes I25.10 Atherosclerotic heart disease of native coronary artery without angina pectoris 08/31/2022 No Yes I10 Essential (primary) hypertension 08/31/2022 No Yes E11.65 Type 2 diabetes mellitus with hyperglycemia 08/31/2022 No Yes E11.40 Type 2 diabetes mellitus with diabetic  neuropathy, unspecified 08/31/2022 No Yes I63.9 Cerebral infarction, unspecified 08/31/2022 No Yes Inactive Problems Resolved Problems ICD-10 Code Description Active Date Resolved Date L97.821 Non-pressure chronic ulcer of other part of left lower leg limited to breakdown of skin 11/05/2022 11/05/2022 Electronic Signature(s) Signed: 05/04/2023 9:33:39 AM By: Duanne Guess MD FACS Entered By: Duanne Guess on 05/04/2023 09:33:38 Casebolt, Khaleed A ([id number]) [id number] Page 6 of 11 -------------------------------------------------------------------------------- Progress Note Details Patient Name: Date of Service: [place], [name] 05/04/2023 9:15 A M Medical Record Number: [id number] Patient Account Number: [id number] Date of Birth/Sex: Treating RN: [birth year] (67 y.o. M) Primary Care Provider: Arva Chafe Other Clinician: Referring Provider: Treating Provider/Extender: [name] in Treatment: 35 Subjective Chief Complaint Information obtained from Patient 05/14/2020; patient is here for review of an abrasion injury on the left lateral calf 08/31/2022: [name] (1st metatarsal base, plantar) History of Present Illness (HPI) ADMISSION 05/14/2020; this is a 67 year old man with multiple medical issues. Predominantly he has type 2 diabetes with a history of peripheral neuropathy and also history of fairly significant PAD. He had a left superficial femoral to posterior tibial artery bypass in February 2017 he also had an atherectomy and angioplasty by Dr. [name] of the right popliteal artery in 2016. He is supposed to be getting arterial studies annually however this was interrupted last year because of the pandemic. He tells [place] he was at [facility] 2 weeks ago was getting out of of the scooter and traumatized his left lateral lower leg. There was a lot of bleeding as the patient is on Plavix and Eliquis. They have been  dressing this with Neosporin and doing a fairly good job. Wound measures 2.5 x 3.5 it does not have any depth he does not have  H Soft Cloth Surgical T ape, 4 x 10 (in/yd) 1 x Per Week/30 Days Discharge Instructions: Secure with tape as directed. 05/04/2023: The wound is stable this week. He has had an increase in his left lower leg swelling, however, and he has an intact blister on the distal medial aspect of the leg, adjacent to where his saphenous vein was harvested. The wound  was prepared for total contact casting. Topical gentamicin and mupirocin were applied, followed by silver alginate. A total contact cast procedure was performed in standard fashion. We are also going to apply some silver alginate over the blister on his left leg and apply Urgo light compression. Follow- up in 1 week. Electronic Signature(s) Signed: 05/04/2023 9:37:59 AM By: Duanne Guess MD FACS Entered By: Duanne Guess on 05/04/2023 09:37:58 -------------------------------------------------------------------------------- HxROS Details Patient Name: Date of Service: [name], [name] 05/04/2023 9:15 A M Medical Record Number: [id number] Patient Account Number: [id number] Date of Birth/Sex: Treating RN: [birth year] (67 y.o. M) Primary Care Provider: Arva Chafe Other Clinician: Referring Provider: Treating Provider/Extender: [name] in Treatment: 35 Information Obtained From Patient Respiratory Medical History: Positive for: Sleep Apnea Cardiovascular Medical History: Positive for: Arrhythmia - A-Fib; Coronary Artery Disease - s/p CABG; Deep Vein Thrombosis; Hypertension; Myocardial Infarction; Peripheral Arterial Disease - s/p Fempop x 2 Past Medical History Notes: CVA x 3 Endocrine Medical HistoryLAWERANCE, MATSUO A ([id number]) [id number] Page 10 of 11 Positive for: Type II Diabetes Time with diabetes: since 1997 Treated with: Insulin, Oral agents Blood sugar tested every day: Yes Tested : 2-3x per day Musculoskeletal Medical History: Past Medical History Notes: carpal tunnel syndrome Neurologic Medical History: Positive for: Neuropathy Past Medical History Notes: stroke Oncologic Medical History: Past Medical History Notes: skin cancer Immunizations Pneumococcal Vaccine: Received Pneumococcal Vaccination: Yes Received Pneumococcal Vaccination On or After 60th Birthday: Yes Implantable  Devices None Hospitalization / Surgery History Type of Hospitalization/Surgery colonoscopy polypectomy peripheral vascular cath shoulder arthroscopy carpal tunnel release coronary artery bypass appendectomy cardiac cath coronary angioplasty thrombectomy knee arthroplasty popliteal artery stent Family and Social History Unknown History: Yes; Never smoker; Marital Status - Single; Alcohol Use: Never; Drug Use: No History; Caffeine Use: Rarely; Financial Concerns: No; Food, Clothing or Shelter Needs: No; Support System Lacking: No; Transportation Concerns: No Electronic Signature(s) Signed: 05/04/2023 10:00:44 AM By: Duanne Guess MD FACS Entered By: Duanne Guess on 05/04/2023 09:36:11 -------------------------------------------------------------------------------- Total Contact Cast Details Patient Name: Date of Service: [name] RK A. 05/04/2023 9:15 A M Medical Record Number: [id number] Patient Account Number: [id number] Date of Birth/Sex: Treating RN: [birth year] (67 y.o. [name] Primary Care Provider: Arva Chafe Other Clinician: Referring Provider: Treating Provider/Extender: [name] in Treatment: 35 T Contact Cast Applied for Wound Assessment: otal Wound #2 Right,Plantar Foot Performed By: Physician Duanne Guess, MD The following information was scribed by: Karleen Dolphin, Khaleed A ([id number]) [id number] Page 11 of 11 The information was scribed for: Duanne Guess Post Procedure Diagnosis Same as Pre-procedure Electronic Signature(s) Signed: 05/04/2023 10:00:44 AM By: Duanne Guess MD FACS Signed: 05/04/2023 3:55:41 PM By: Gelene Mink By: Samuella Bruin on 05/04/2023 09:28:49 -------------------------------------------------------------------------------- SuperBill Details Patient Name: Date of Service: [name], [name] 05/04/2023 Medical  Record Number: [id number] Patient Account Number: [id number] Date of Birth/Sex: Treating RN: 03/07/1956 (67 y.o. M) Primary Care Provider: Arva Chafe Other Clinician: Referring Provider: Treating Provider/Extender: [name] in Treatment: 35 Diagnosis Coding ICD-10 Codes Code Description [id number] Non-pressure chronic ulcer of  H Soft Cloth Surgical T ape, 4 x 10 (in/yd) 1 x Per Week/30 Days Discharge Instructions: Secure with tape as directed. 05/04/2023: The wound is stable this week. He has had an increase in his left lower leg swelling, however, and he has an intact blister on the distal medial aspect of the leg, adjacent to where his saphenous vein was harvested. The wound  was prepared for total contact casting. Topical gentamicin and mupirocin were applied, followed by silver alginate. A total contact cast procedure was performed in standard fashion. We are also going to apply some silver alginate over the blister on his left leg and apply Urgo light compression. Follow- up in 1 week. Electronic Signature(s) Signed: 05/04/2023 9:37:59 AM By: Duanne Guess MD FACS Entered By: Duanne Guess on 05/04/2023 09:37:58 -------------------------------------------------------------------------------- HxROS Details Patient Name: Date of Service: [name], [name] 05/04/2023 9:15 A M Medical Record Number: [id number] Patient Account Number: [id number] Date of Birth/Sex: Treating RN: [birth year] (67 y.o. M) Primary Care Provider: Arva Chafe Other Clinician: Referring Provider: Treating Provider/Extender: [name] in Treatment: 35 Information Obtained From Patient Respiratory Medical History: Positive for: Sleep Apnea Cardiovascular Medical History: Positive for: Arrhythmia - A-Fib; Coronary Artery Disease - s/p CABG; Deep Vein Thrombosis; Hypertension; Myocardial Infarction; Peripheral Arterial Disease - s/p Fempop x 2 Past Medical History Notes: CVA x 3 Endocrine Medical HistoryLAWERANCE, MATSUO A ([id number]) [id number] Page 10 of 11 Positive for: Type II Diabetes Time with diabetes: since 1997 Treated with: Insulin, Oral agents Blood sugar tested every day: Yes Tested : 2-3x per day Musculoskeletal Medical History: Past Medical History Notes: carpal tunnel syndrome Neurologic Medical History: Positive for: Neuropathy Past Medical History Notes: stroke Oncologic Medical History: Past Medical History Notes: skin cancer Immunizations Pneumococcal Vaccine: Received Pneumococcal Vaccination: Yes Received Pneumococcal Vaccination On or After 60th Birthday: Yes Implantable  Devices None Hospitalization / Surgery History Type of Hospitalization/Surgery colonoscopy polypectomy peripheral vascular cath shoulder arthroscopy carpal tunnel release coronary artery bypass appendectomy cardiac cath coronary angioplasty thrombectomy knee arthroplasty popliteal artery stent Family and Social History Unknown History: Yes; Never smoker; Marital Status - Single; Alcohol Use: Never; Drug Use: No History; Caffeine Use: Rarely; Financial Concerns: No; Food, Clothing or Shelter Needs: No; Support System Lacking: No; Transportation Concerns: No Electronic Signature(s) Signed: 05/04/2023 10:00:44 AM By: Duanne Guess MD FACS Entered By: Duanne Guess on 05/04/2023 09:36:11 -------------------------------------------------------------------------------- Total Contact Cast Details Patient Name: Date of Service: [name] RK A. 05/04/2023 9:15 A M Medical Record Number: [id number] Patient Account Number: [id number] Date of Birth/Sex: Treating RN: [birth year] (67 y.o. [name] Primary Care Provider: Arva Chafe Other Clinician: Referring Provider: Treating Provider/Extender: [name] in Treatment: 35 T Contact Cast Applied for Wound Assessment: otal Wound #2 Right,Plantar Foot Performed By: Physician Duanne Guess, MD The following information was scribed by: Karleen Dolphin, Khaleed A ([id number]) [id number] Page 11 of 11 The information was scribed for: Duanne Guess Post Procedure Diagnosis Same as Pre-procedure Electronic Signature(s) Signed: 05/04/2023 10:00:44 AM By: Duanne Guess MD FACS Signed: 05/04/2023 3:55:41 PM By: Gelene Mink By: Samuella Bruin on 05/04/2023 09:28:49 -------------------------------------------------------------------------------- SuperBill Details Patient Name: Date of Service: [name], [name] 05/04/2023 Medical  Record Number: [id number] Patient Account Number: [id number] Date of Birth/Sex: Treating RN: 03/07/1956 (67 y.o. M) Primary Care Provider: Arva Chafe Other Clinician: Referring Provider: Treating Provider/Extender: [name] in Treatment: 35 Diagnosis Coding ICD-10 Codes Code Description [id number] Non-pressure chronic ulcer of  Apply Mupirocin (Bactroban) as instructed Prim Dressing: Maxorb Extra Ag+ Alginate Dressing, 2x2 (in/in) 1 x Per Week/30 Days ary Discharge Instructions: Apply to wound bed as instructed Secondary Dressing: Woven Gauze  Sponge, Non-Sterile 4x4 in 1 x Per Week/30 Days Discharge Instructions: Apply over primary dressing as directed. Secondary Dressing: Zetuvit Plus 4x8 in 1 x Per Week/30 Days Discharge Instructions: Apply over primary dressing as directed. Secured With: [employer], 4.5x3.1 (in/yd) 1 x Per Week/30 Days Discharge Instructions: Secure with Kerlix as directed. Secured With: 24M Medipore H Soft Cloth Surgical T ape, 4 x 10 (in/yd) 1 x Per Week/30 Days Discharge Instructions: Secure with tape as directed. Patient Medications llergies: No Known Drug Allergies A Notifications Medication Indication Start End 05/04/2023 lidocaine DOSE topical 5 % ointment - ointment topical Electronic Signature(s) Signed: 05/04/2023 10:00:44 AM By: Duanne Guess MD FACS Entered By: Duanne Guess on 05/04/2023 09:37:09 Fennewald, Jacy A ([id number]) [id number] Page 5 of 11 -------------------------------------------------------------------------------- Problem List Details Patient Name: Date of Service: [place], [name] 05/04/2023 9:15 A M Medical Record Number: [id number] Patient Account Number: [id number] Date of Birth/Sex: Treating RN: 1956/07/13 (67 y.o. M) Primary Care Provider: Arva Chafe Other Clinician: Referring Provider: Treating Provider/Extender: [name] in Treatment: 35 Active Problems ICD-10 Encounter Code Description Active Date MDM Diagnosis L97.512 Non-pressure chronic ulcer of other part of right foot with fat layer exposed 08/31/2022 No Yes R60.0 Localized edema 05/04/2023 No Yes I73.9 Peripheral vascular disease, unspecified 08/31/2022 No Yes I25.10 Atherosclerotic heart disease of native coronary artery without angina pectoris 08/31/2022 No Yes I10 Essential (primary) hypertension 08/31/2022 No Yes E11.65 Type 2 diabetes mellitus with hyperglycemia 08/31/2022 No Yes E11.40 Type 2 diabetes mellitus with diabetic  neuropathy, unspecified 08/31/2022 No Yes I63.9 Cerebral infarction, unspecified 08/31/2022 No Yes Inactive Problems Resolved Problems ICD-10 Code Description Active Date Resolved Date L97.821 Non-pressure chronic ulcer of other part of left lower leg limited to breakdown of skin 11/05/2022 11/05/2022 Electronic Signature(s) Signed: 05/04/2023 9:33:39 AM By: Duanne Guess MD FACS Entered By: Duanne Guess on 05/04/2023 09:33:38 Casebolt, Khaleed A ([id number]) [id number] Page 6 of 11 -------------------------------------------------------------------------------- Progress Note Details Patient Name: Date of Service: [place], [name] 05/04/2023 9:15 A M Medical Record Number: [id number] Patient Account Number: [id number] Date of Birth/Sex: Treating RN: [birth year] (67 y.o. M) Primary Care Provider: Arva Chafe Other Clinician: Referring Provider: Treating Provider/Extender: [name] in Treatment: 35 Subjective Chief Complaint Information obtained from Patient 05/14/2020; patient is here for review of an abrasion injury on the left lateral calf 08/31/2022: [name] (1st metatarsal base, plantar) History of Present Illness (HPI) ADMISSION 05/14/2020; this is a 67 year old man with multiple medical issues. Predominantly he has type 2 diabetes with a history of peripheral neuropathy and also history of fairly significant PAD. He had a left superficial femoral to posterior tibial artery bypass in February 2017 he also had an atherectomy and angioplasty by Dr. [name] of the right popliteal artery in 2016. He is supposed to be getting arterial studies annually however this was interrupted last year because of the pandemic. He tells [place] he was at [facility] 2 weeks ago was getting out of of the scooter and traumatized his left lateral lower leg. There was a lot of bleeding as the patient is on Plavix and Eliquis. They have been  dressing this with Neosporin and doing a fairly good job. Wound measures 2.5 x 3.5 it does not have any depth he does not have

## 2023-05-11 ENCOUNTER — Encounter (HOSPITAL_BASED_OUTPATIENT_CLINIC_OR_DEPARTMENT_OTHER): Payer: Medicare HMO | Admitting: General Surgery

## 2023-05-11 DIAGNOSIS — E11621 Type 2 diabetes mellitus with foot ulcer: Secondary | ICD-10-CM | POA: Diagnosis not present

## 2023-05-11 NOTE — Progress Notes (Signed)
Secondary Dressing: Woven Gauze Sponge, Non-Sterile 4x4 in 1 x Per Week/30 Days Discharge Instructions: Apply over primary dressing as directed. Secondary Dressing: Zetuvit Plus 4x8 in 1 x Per Week/30 Days Discharge Instructions: Apply over primary dressing as directed. Secured With: [employer], 4.5x3.1 (in/yd) 1 x Per Week/30 Days Discharge Instructions: Secure with Kerlix as directed. Secured With: 49M  Medipore H Soft Cloth Surgical T ape, 4 x 10 (in/yd) 1 x Per Week/30 Days Discharge Instructions: Secure with tape as directed. 05/11/2023: The wound is a little bit narrower from 12-6 o'clock but slightly wider from 9-3 o'clock. It is otherwise fairly clean. The blister on his left lower leg is still present but flat. Edema is improved. The wound bed was prepared for total contact casting. T opical gentamicin and mupirocin were applied under silver alginate. Periwound zinc oxide. The total contact cast was then put on in standard fashion. The blister on his left leg ruptured as I was examining it. We will apply silver alginate and Urgo light compression on this leg to help dry things out a little bit more. He will follow-up in 1 week. Electronic Signature(s) Signed: 05/11/2023 10:25:19 AM By: Duanne Guess MD FACS Entered By: Duanne Guess on 05/11/2023 07:25:19 -------------------------------------------------------------------------------- HxROS Details Patient Name: Date of Service: [name], [name] 05/11/2023 10:00 A M Medical Record Number: [id number] Patient Account Number: [id number] Date of Birth/Sex: Treating RN: Aug 26, 1955 (67 y.o. M) Primary Care Provider: Arva Chafe Other Clinician: Referring Provider: Treating Provider/Extender: [name] in Treatment: 36 Information Obtained From Patient Respiratory Medical History: Positive for: Sleep Apnea Cardiovascular Medical History: Positive for: Arrhythmia - A-Fib; Coronary Artery Disease - s/p CABG; Deep Vein Thrombosis; Hypertension; Myocardial Infarction; Peripheral Arterial Disease - s/p Fempop x 2 Past Medical History Notes: CVA x 3 Shaddock, Luca A ([id number]) [phone].pdf Page 10 of 11 Endocrine Medical History: Positive for: Type II Diabetes Time with diabetes: since 1997 Treated with: Insulin, Oral agents Blood sugar tested every day: Yes Tested :  2-3x per day Musculoskeletal Medical History: Past Medical History Notes: carpal tunnel syndrome Neurologic Medical History: Positive for: Neuropathy Past Medical History Notes: stroke Oncologic Medical History: Past Medical History Notes: skin cancer Immunizations Pneumococcal Vaccine: Received Pneumococcal Vaccination: Yes Received Pneumococcal Vaccination On or After 60th Birthday: Yes Implantable Devices None Hospitalization / Surgery History Type of Hospitalization/Surgery colonoscopy polypectomy peripheral vascular cath shoulder arthroscopy carpal tunnel release coronary artery bypass appendectomy cardiac cath coronary angioplasty thrombectomy knee arthroplasty popliteal artery stent Family and Social History Unknown History: Yes; Never smoker; Marital Status - Single; Alcohol Use: Never; Drug Use: No History; Caffeine Use: Rarely; Financial Concerns: No; Food, Clothing or Shelter Needs: No; Support System Lacking: No; Transportation Concerns: No Electronic Signature(s) Signed: 05/11/2023 10:55:53 AM By: Duanne Guess MD FACS Entered By: Duanne Guess on 05/11/2023 07:23:09 -------------------------------------------------------------------------------- Total Contact Cast Details Patient Name: Date of Service: [name], [name] 05/11/2023 10:00 A M Medical Record Number: [id number] Patient Account Number: [id number] Date of Birth/Sex: Treating RN: March 11, 1956 (67 y.o. [name] Primary Care Provider: Arva Chafe Other Clinician: Referring Provider: Treating Provider/Extender: [name] in Treatment: [phone] T Contact Cast Applied for Wound Assessment: otal Wound #2 Right,Plantar Foot Skalsky, Yussuf A ([id number]) [phone].pdf Page 11 of 11 Performed By: Physician Duanne Guess, MD The following information was scribed by: Samuella Bruin The information was scribed for: Duanne Guess Post Procedure Diagnosis Same as Pre-procedure Electronic Signature(s) Signed: 05/11/2023 10:55:53 AM By: Lady Gary,  Bradeen, Alvon A ([id number]) [id number] Page 1 of 11 Visit Report for 05/11/2023 Chief Complaint Document Details Patient Name: Date of Service: [place], [name] 05/11/2023 10:00 A M Medical Record Number: [id number] Patient Account Number: [id number] Date of Birth/Sex: Treating RN: 02/13/1956 (67 y.o. M) Primary Care Provider: Arva Chafe Other Clinician: Referring Provider: Treating Provider/Extender: [name] in Treatment: 36 Information Obtained from: Patient Chief Complaint 05/14/2020; patient is here for review of an abrasion injury on the left lateral calf 08/31/2022: [name] (1st metatarsal base, plantar) Electronic Signature(s) Signed: 05/11/2023 10:20:16 AM By: Duanne Guess MD FACS Entered By: Duanne Guess on 05/11/2023 07:20:16 -------------------------------------------------------------------------------- HPI Details Patient Name: Date of Service: [name], [name] 05/11/2023 10:00 A M Medical Record Number: [id number] Patient Account Number: [id number] Date of Birth/Sex: Treating RN: [birth year] (67 y.o. M) Primary Care Provider: Arva Chafe Other Clinician: Referring Provider: Treating Provider/Extender: [name] in Treatment: 36 History of Present Illness HPI Description: ADMISSION 05/14/2020; this is a 67 year old man with multiple medical issues. Predominantly he has type 2 diabetes with a history of peripheral neuropathy and also history of fairly significant PAD. He had a left superficial femoral to posterior tibial artery bypass in February 2017 he also had an atherectomy and angioplasty by Dr. [name] of the right popliteal artery in 2016. He is supposed to be getting arterial studies annually however this was interrupted last year because of the pandemic. He tells [place] he was at [facility] 2 weeks ago was getting out of of the scooter and traumatized his  left lateral lower leg. There was a lot of bleeding as the patient is on Plavix and Eliquis. They have been dressing this with Neosporin and doing a fairly good job. Wound measures 2.5 x 3.5 it does not have any depth he does not have a wound history in his legs outside of surgery however he does have chronic edema and skin changes suggestive of chronic venous disease possibly some degree of lymphedema as well. Past medical history includes type 2 diabetes with peripheral neuropathy and gait instability, lumbar spondylosis, obstructive sleep apnea, history of a left pontine CVA, basal cell skin cancer, atrial fibrillation on anticoagulation, significant PAD as noted with a left superficial femoral to posterior tibial bypass in February 2017 and a right popliteal atherectomy and angioplasty by Dr. [name] in 30th 2016. He also has a history of coronary artery disease with an MI in 2002 hypertension hyperlipidemia and heart failure with preserved ejection fraction His last arterial studies I can see in epic were on 03/10/2018 this showed a right ABI of 0.69 and a right TBI of 0.5 with monophasic waveforms on the right. On the left his ABI was 1.20 with a TBI of 0.92 and triphasic waveforms. He has not had arterial studies since. Our nurse in the clinic got an ABI on the left of 1.1 11/2; left anterior leg wound in the setting of chronic venous insufficiency. Wound was initially trauma. We have been using Hydrofera Blue under compression he has home health.. The wound looks a lot better today with improvement in surface area 11/16; left anterior leg wound in the setting of chronic venous insufficiency. Wound was initially trauma. We have been using Hydrofera Blue under compression. The patient is closed today. He is supposed to follow-up with vein and vascular with regards to arterial insufficiency nevertheless his leg wounds are 12/3; apparently 2 weeks ago when they were putting on their stockings they  Secondary Dressing: Woven Gauze Sponge, Non-Sterile 4x4 in 1 x Per Week/30 Days Discharge Instructions: Apply over primary dressing as directed. Secondary Dressing: Zetuvit Plus 4x8 in 1 x Per Week/30 Days Discharge Instructions: Apply over primary dressing as directed. Secured With: [employer], 4.5x3.1 (in/yd) 1 x Per Week/30 Days Discharge Instructions: Secure with Kerlix as directed. Secured With: 49M  Medipore H Soft Cloth Surgical T ape, 4 x 10 (in/yd) 1 x Per Week/30 Days Discharge Instructions: Secure with tape as directed. 05/11/2023: The wound is a little bit narrower from 12-6 o'clock but slightly wider from 9-3 o'clock. It is otherwise fairly clean. The blister on his left lower leg is still present but flat. Edema is improved. The wound bed was prepared for total contact casting. T opical gentamicin and mupirocin were applied under silver alginate. Periwound zinc oxide. The total contact cast was then put on in standard fashion. The blister on his left leg ruptured as I was examining it. We will apply silver alginate and Urgo light compression on this leg to help dry things out a little bit more. He will follow-up in 1 week. Electronic Signature(s) Signed: 05/11/2023 10:25:19 AM By: Duanne Guess MD FACS Entered By: Duanne Guess on 05/11/2023 07:25:19 -------------------------------------------------------------------------------- HxROS Details Patient Name: Date of Service: [name], [name] 05/11/2023 10:00 A M Medical Record Number: [id number] Patient Account Number: [id number] Date of Birth/Sex: Treating RN: Aug 26, 1955 (67 y.o. M) Primary Care Provider: Arva Chafe Other Clinician: Referring Provider: Treating Provider/Extender: [name] in Treatment: 36 Information Obtained From Patient Respiratory Medical History: Positive for: Sleep Apnea Cardiovascular Medical History: Positive for: Arrhythmia - A-Fib; Coronary Artery Disease - s/p CABG; Deep Vein Thrombosis; Hypertension; Myocardial Infarction; Peripheral Arterial Disease - s/p Fempop x 2 Past Medical History Notes: CVA x 3 Shaddock, Luca A ([id number]) [phone].pdf Page 10 of 11 Endocrine Medical History: Positive for: Type II Diabetes Time with diabetes: since 1997 Treated with: Insulin, Oral agents Blood sugar tested every day: Yes Tested :  2-3x per day Musculoskeletal Medical History: Past Medical History Notes: carpal tunnel syndrome Neurologic Medical History: Positive for: Neuropathy Past Medical History Notes: stroke Oncologic Medical History: Past Medical History Notes: skin cancer Immunizations Pneumococcal Vaccine: Received Pneumococcal Vaccination: Yes Received Pneumococcal Vaccination On or After 60th Birthday: Yes Implantable Devices None Hospitalization / Surgery History Type of Hospitalization/Surgery colonoscopy polypectomy peripheral vascular cath shoulder arthroscopy carpal tunnel release coronary artery bypass appendectomy cardiac cath coronary angioplasty thrombectomy knee arthroplasty popliteal artery stent Family and Social History Unknown History: Yes; Never smoker; Marital Status - Single; Alcohol Use: Never; Drug Use: No History; Caffeine Use: Rarely; Financial Concerns: No; Food, Clothing or Shelter Needs: No; Support System Lacking: No; Transportation Concerns: No Electronic Signature(s) Signed: 05/11/2023 10:55:53 AM By: Duanne Guess MD FACS Entered By: Duanne Guess on 05/11/2023 07:23:09 -------------------------------------------------------------------------------- Total Contact Cast Details Patient Name: Date of Service: [name], [name] 05/11/2023 10:00 A M Medical Record Number: [id number] Patient Account Number: [id number] Date of Birth/Sex: Treating RN: March 11, 1956 (67 y.o. [name] Primary Care Provider: Arva Chafe Other Clinician: Referring Provider: Treating Provider/Extender: [name] in Treatment: [phone] T Contact Cast Applied for Wound Assessment: otal Wound #2 Right,Plantar Foot Skalsky, Yussuf A ([id number]) [phone].pdf Page 11 of 11 Performed By: Physician Duanne Guess, MD The following information was scribed by: Samuella Bruin The information was scribed for: Duanne Guess Post Procedure Diagnosis Same as Pre-procedure Electronic Signature(s) Signed: 05/11/2023 10:55:53 AM By: Lady Gary,  Instructions: Apply Zinc Oxide to periwound with each dressing change Topical: Gentamicin 1 x Per Week/30 Days Discharge Instructions: As directed by physician Topical: Mupirocin Ointment 1 x Per Week/30 Days Discharge Instructions: Apply Mupirocin (Bactroban) as  instructed Prim Dressing: Maxorb Extra Ag+ Alginate Dressing, 2x2 (in/in) 1 x Per Week/30 Days ary Discharge Instructions: Apply to wound bed as instructed Secondary Dressing: Woven Gauze Sponge, Non-Sterile 4x4 in 1 x Per Week/30 Days Discharge Instructions: Apply over primary dressing as directed. Secondary Dressing: Zetuvit Plus 4x8 in 1 x Per Week/30 Days Discharge Instructions: Apply over primary dressing as directed. Secured With: [employer], 4.5x3.1 (in/yd) 1 x Per Week/30 Days Discharge Instructions: Secure with Kerlix as directed. Secured With: 33M Medipore H Soft Cloth Surgical T ape, 4 x 10 (in/yd) 1 x Per Week/30 Days Discharge Instructions: Secure with tape as directed. Patient Medications llergies: No Known Drug Allergies A Notifications Medication Indication Start End 05/11/2023 lidocaine DOSE topical 4 % cream - cream topical Electronic Signature(s) Signed: 05/11/2023 10:55:53 AM By: Duanne Guess MD FACS Entered By: Duanne Guess on 05/11/2023 07:24:22 Acres, Yorel A ([id number]) [id number].pdf Page 5 of 11 -------------------------------------------------------------------------------- Problem List Details Patient Name: Date of Service: [place], [name] 05/11/2023 10:00 A M Medical Record Number: [id number] Patient Account Number: [id number] Date of Birth/Sex: Treating RN: 02-20-1956 (67 y.o. M) Primary Care Provider: Arva Chafe Other Clinician: Referring Provider: Treating Provider/Extender: [name] in Treatment: 36 Active Problems ICD-10 Encounter Code Description Active Date MDM Diagnosis L97.512 Non-pressure chronic ulcer of other part of right foot with fat layer exposed 08/31/2022 No Yes R60.0 Localized edema 05/04/2023 No Yes I73.9 Peripheral vascular disease, unspecified 08/31/2022 No Yes I25.10 Atherosclerotic heart disease of native coronary artery without angina pectoris  08/31/2022 No Yes I10 Essential (primary) hypertension 08/31/2022 No Yes E11.65 Type 2 diabetes mellitus with hyperglycemia 08/31/2022 No Yes E11.40 Type 2 diabetes mellitus with diabetic neuropathy, unspecified 08/31/2022 No Yes I63.9 Cerebral infarction, unspecified 08/31/2022 No Yes Inactive Problems Resolved Problems ICD-10 Code Description Active Date Resolved Date L97.821 Non-pressure chronic ulcer of other part of left lower leg limited to breakdown of skin 11/05/2022 11/05/2022 Electronic Signature(s) Signed: 05/11/2023 10:20:01 AM By: Duanne Guess MD FACS Entered By: Duanne Guess on 05/11/2023 07:20:01 Cahoon, Legrand A ([id number]) [id number] Page 6 of 11 -------------------------------------------------------------------------------- Progress Note Details Patient Name: Date of Service: [place], [name] 05/11/2023 10:00 A M Medical Record Number: [id number] Patient Account Number: [id number] Date of Birth/Sex: Treating RN: 1956/04/26 (67 y.o. M) Primary Care Provider: Arva Chafe Other Clinician: Referring Provider: Treating Provider/Extender: [name] in Treatment: 36 Subjective Chief Complaint Information obtained from Patient 05/14/2020; patient is here for review of an abrasion injury on the left lateral calf 08/31/2022: [name] (1st metatarsal base, plantar) History of Present Illness (HPI) ADMISSION 05/14/2020; this is a 67 year old man with multiple medical issues. Predominantly he has type 2 diabetes with a history of peripheral neuropathy and also history of fairly significant PAD. He had a left superficial femoral to posterior tibial artery bypass in February 2017 he also had an atherectomy and angioplasty by Dr. [name] of the right popliteal artery in 2016. He is supposed to be getting arterial studies annually however this was interrupted last year because of the pandemic. He tells [place] he was at  [facility] 2 weeks ago was getting out of of the scooter and traumatized his left lateral lower leg. There was a lot of bleeding as the patient  Victorino Dike MD FACS Signed: 05/11/2023 3:45:49 PM By: Gelene Mink By: Samuella Bruin on 05/11/2023 07:15:44 -------------------------------------------------------------------------------- SuperBill Details Patient Name: Date of Service: [name], [name] 05/11/2023 Medical Record Number: [id number] Patient Account Number: [id number] Date of Birth/Sex: Treating RN: February 01, 1956 (67 y.o. M) Primary Care Provider: Arva Chafe Other Clinician: Referring Provider: Treating Provider/Extender: [name] in Treatment: 36 Diagnosis Coding ICD-10 Codes Code Description [phone] Non-pressure chronic ulcer of other part of right foot with fat layer exposed R60.0 Localized edema I73.9 Peripheral vascular disease, unspecified I25.10 Atherosclerotic heart disease of native coronary artery without angina pectoris I10 Essential (primary) hypertension E11.65 Type 2 diabetes mellitus with hyperglycemia E11.40 Type 2 diabetes mellitus with diabetic neuropathy, unspecified I63.9 Cerebral infarction, unspecified Facility Procedures : CPT4 Code: [id number] Description: 29445 - APPLY TOTAL CONTACT LEG CAST ICD-10 Diagnosis Description L97.512 Non-pressure chronic ulcer of other part of right foot with fat layer exposed Modifier: Quantity: 1 Physician Procedures : CPT4 Code Description Modifier [id number] 99214 - WC PHYS LEVEL 4 - EST PT 25 ICD-10 Diagnosis Description L97.512 Non-pressure chronic ulcer of other part of right foot with fat layer exposed R60.0 Localized edema I73.9 Peripheral vascular disease,  unspecified E11.65 Type 2 diabetes mellitus with hyperglycemia Quantity: 1 : [id number] 29528 - WC PHYS APPLY TOTAL CONTACT CAST ICD-10 Diagnosis Description L97.512 Non-pressure chronic ulcer of other part of right foot with fat layer  exposed Quantity: 1 Electronic Signature(s) Signed: 05/11/2023 10:25:47 AM By: Duanne Guess MD FACS Entered By: Duanne Guess on 05/11/2023 07:25:47  Victorino Dike MD FACS Signed: 05/11/2023 3:45:49 PM By: Gelene Mink By: Samuella Bruin on 05/11/2023 07:15:44 -------------------------------------------------------------------------------- SuperBill Details Patient Name: Date of Service: [name], [name] 05/11/2023 Medical Record Number: [id number] Patient Account Number: [id number] Date of Birth/Sex: Treating RN: February 01, 1956 (67 y.o. M) Primary Care Provider: Arva Chafe Other Clinician: Referring Provider: Treating Provider/Extender: [name] in Treatment: 36 Diagnosis Coding ICD-10 Codes Code Description [phone] Non-pressure chronic ulcer of other part of right foot with fat layer exposed R60.0 Localized edema I73.9 Peripheral vascular disease, unspecified I25.10 Atherosclerotic heart disease of native coronary artery without angina pectoris I10 Essential (primary) hypertension E11.65 Type 2 diabetes mellitus with hyperglycemia E11.40 Type 2 diabetes mellitus with diabetic neuropathy, unspecified I63.9 Cerebral infarction, unspecified Facility Procedures : CPT4 Code: [id number] Description: 29445 - APPLY TOTAL CONTACT LEG CAST ICD-10 Diagnosis Description L97.512 Non-pressure chronic ulcer of other part of right foot with fat layer exposed Modifier: Quantity: 1 Physician Procedures : CPT4 Code Description Modifier [id number] 99214 - WC PHYS LEVEL 4 - EST PT 25 ICD-10 Diagnosis Description L97.512 Non-pressure chronic ulcer of other part of right foot with fat layer exposed R60.0 Localized edema I73.9 Peripheral vascular disease,  unspecified E11.65 Type 2 diabetes mellitus with hyperglycemia Quantity: 1 : [id number] 29528 - WC PHYS APPLY TOTAL CONTACT CAST ICD-10 Diagnosis Description L97.512 Non-pressure chronic ulcer of other part of right foot with fat layer  exposed Quantity: 1 Electronic Signature(s) Signed: 05/11/2023 10:25:47 AM By: Duanne Guess MD FACS Entered By: Duanne Guess on 05/11/2023 07:25:47  [phone].pdf Page 3 of 11 05/04/2023: The wound is stable this week. He has had an increase in his left lower leg swelling, however, and he has an intact blister on the distal medial aspect of the leg, adjacent to where his saphenous vein was harvested. 05/11/2023: The wound is a little bit narrower from 12-6 o'clock but slightly wider from 9-3 o'clock. It is otherwise fairly clean. The blister on his left lower leg is still present but flat. Edema is improved. Electronic Signature(s) Signed: 05/11/2023 10:22:09 AM By: Duanne Guess MD  FACS Entered By: Duanne Guess on 05/11/2023 07:22:09 -------------------------------------------------------------------------------- Physical Exam Details Patient Name: Date of Service: [name], [name] 05/11/2023 10:00 A M Medical Record Number: [id number] Patient Account Number: [id number] Date of Birth/Sex: Treating RN: Jul 30, 1955 (67 y.o. M) Primary Care Provider: Arva Chafe Other Clinician: Referring Provider: Treating Provider/Extender: [name] in Treatment: 36 Constitutional Hypertensive, asymptomatic. . . . no acute distress. Respiratory Normal work of breathing on room air. Notes 05/11/2023: The wound is a little bit narrower from 12-6 o'clock but slightly wider from 9-3 o'clock. It is otherwise fairly clean. The blister on his left lower leg is still present but flat. Edema is improved. Electronic Signature(s) Signed: 05/11/2023 10:23:43 AM By: Duanne Guess MD FACS Entered By: Duanne Guess on 05/11/2023 07:23:43 -------------------------------------------------------------------------------- Physician Orders Details Patient Name: Date of Service: [name], [name] 05/11/2023 10:00 A M Medical Record Number: [id number] Patient Account Number: [id number] Date of Birth/Sex: Treating RN: 1955/10/25 (67 y.o. [name] Primary Care Provider: Arva Chafe Other Clinician: Referring Provider: Treating Provider/Extender: [name] in Treatment: 36 The following information was scribed by: Samuella Bruin The information was scribed for: Duanne Guess Verbal / Phone Orders: No Diagnosis Coding ICD-10 Coding Code Description [phone] Non-pressure chronic ulcer of other part of right foot with fat layer exposed R60.0 Localized edema I73.9 Peripheral vascular disease, unspecified I25.10 Atherosclerotic heart disease of native coronary artery without angina pectoris I10  Essential (primary) hypertension E11.65 Type 2 diabetes mellitus with hyperglycemia E11.40 Type 2 diabetes mellitus with diabetic neuropathy, unspecified Buttler, Fleet A ([id number]) [id number] Page 4 of 11 I63.9 Cerebral infarction, unspecified Follow-up Appointments ppointment in 1 week. - Dr. Lady Gary - room 2 TCC Return A Anesthetic (In clinic) Topical Lidocaine 4% applied to wound bed Bathing/ Shower/ Hygiene May shower with protection but do not get wound dressing(s) wet. Protect dressing(s) with water repellant cover (for example, large plastic bag) or a cast cover and may then take shower. Edema Control - Lymphedema / SCD / Other Elevate legs to the level of the heart or above for 30 minutes daily and/or when sitting for 3-4 times a day throughout the day. Avoid standing for long periods of time. Patient to wear own compression stockings every day. Moisturize legs daily. Off-Loading Total Contact Cast to Right Lower Extremity Removable cast walker boot to: - right leg Non Wound Condition Left Lower Extremity Other Non Wound Condition Orders/Instructions: - compression wrap urgo lite to left leg Wound Treatment Wound #2 - Foot Wound Laterality: Plantar, Right Cleanser: Soap and Water 1 x Per Week/30 Days Discharge Instructions: May shower and wash wound with dial antibacterial soap and water prior to dressing change. Cleanser: Wound Cleanser 1 x Per Week/30 Days Discharge Instructions: Cleanse the wound with wound cleanser prior to applying a clean dressing using gauze sponges, not tissue or cotton balls. Peri-Wound Care: Zinc Oxide Ointment 30g tube 1 x Per Week/30 Days Discharge  Victorino Dike MD FACS Signed: 05/11/2023 3:45:49 PM By: Gelene Mink By: Samuella Bruin on 05/11/2023 07:15:44 -------------------------------------------------------------------------------- SuperBill Details Patient Name: Date of Service: [name], [name] 05/11/2023 Medical Record Number: [id number] Patient Account Number: [id number] Date of Birth/Sex: Treating RN: February 01, 1956 (67 y.o. M) Primary Care Provider: Arva Chafe Other Clinician: Referring Provider: Treating Provider/Extender: [name] in Treatment: 36 Diagnosis Coding ICD-10 Codes Code Description [phone] Non-pressure chronic ulcer of other part of right foot with fat layer exposed R60.0 Localized edema I73.9 Peripheral vascular disease, unspecified I25.10 Atherosclerotic heart disease of native coronary artery without angina pectoris I10 Essential (primary) hypertension E11.65 Type 2 diabetes mellitus with hyperglycemia E11.40 Type 2 diabetes mellitus with diabetic neuropathy, unspecified I63.9 Cerebral infarction, unspecified Facility Procedures : CPT4 Code: [id number] Description: 29445 - APPLY TOTAL CONTACT LEG CAST ICD-10 Diagnosis Description L97.512 Non-pressure chronic ulcer of other part of right foot with fat layer exposed Modifier: Quantity: 1 Physician Procedures : CPT4 Code Description Modifier [id number] - WC PHYS LEVEL 4 - EST PT 25 ICD-10 Diagnosis Description L97.512 Non-pressure chronic ulcer of other part of right foot with fat layer exposed R60.0 Localized edema I73.9 Peripheral vascular disease,  unspecified E11.65 Type 2 diabetes mellitus with hyperglycemia Quantity: 1 : [id number] - WC PHYS APPLY TOTAL CONTACT CAST ICD-10 Diagnosis Description L97.512 Non-pressure chronic ulcer of other part of right foot with fat layer  exposed Quantity: 1 Electronic Signature(s) Signed: 05/11/2023 10:25:47 AM By: Duanne Guess MD FACS Entered By: Duanne Guess on 05/11/2023 07:25:47  Instructions: Apply Zinc Oxide to periwound with each dressing change Topical: Gentamicin 1 x Per Week/30 Days Discharge Instructions: As directed by physician Topical: Mupirocin Ointment 1 x Per Week/30 Days Discharge Instructions: Apply Mupirocin (Bactroban) as  instructed Prim Dressing: Maxorb Extra Ag+ Alginate Dressing, 2x2 (in/in) 1 x Per Week/30 Days ary Discharge Instructions: Apply to wound bed as instructed Secondary Dressing: Woven Gauze Sponge, Non-Sterile 4x4 in 1 x Per Week/30 Days Discharge Instructions: Apply over primary dressing as directed. Secondary Dressing: Zetuvit Plus 4x8 in 1 x Per Week/30 Days Discharge Instructions: Apply over primary dressing as directed. Secured With: [employer], 4.5x3.1 (in/yd) 1 x Per Week/30 Days Discharge Instructions: Secure with Kerlix as directed. Secured With: 33M Medipore H Soft Cloth Surgical T ape, 4 x 10 (in/yd) 1 x Per Week/30 Days Discharge Instructions: Secure with tape as directed. Patient Medications llergies: No Known Drug Allergies A Notifications Medication Indication Start End 05/11/2023 lidocaine DOSE topical 4 % cream - cream topical Electronic Signature(s) Signed: 05/11/2023 10:55:53 AM By: Duanne Guess MD FACS Entered By: Duanne Guess on 05/11/2023 07:24:22 Acres, Yorel A ([id number]) [id number] Page 5 of 11 -------------------------------------------------------------------------------- Problem List Details Patient Name: Date of Service: [place], [name] 05/11/2023 10:00 A M Medical Record Number: [id number] Patient Account Number: [id number] Date of Birth/Sex: Treating RN: 02-20-1956 (67 y.o. M) Primary Care Provider: Arva Chafe Other Clinician: Referring Provider: Treating Provider/Extender: [name] in Treatment: 36 Active Problems ICD-10 Encounter Code Description Active Date MDM Diagnosis L97.512 Non-pressure chronic ulcer of other part of right foot with fat layer exposed 08/31/2022 No Yes R60.0 Localized edema 05/04/2023 No Yes I73.9 Peripheral vascular disease, unspecified 08/31/2022 No Yes I25.10 Atherosclerotic heart disease of native coronary artery without angina pectoris  08/31/2022 No Yes I10 Essential (primary) hypertension 08/31/2022 No Yes E11.65 Type 2 diabetes mellitus with hyperglycemia 08/31/2022 No Yes E11.40 Type 2 diabetes mellitus with diabetic neuropathy, unspecified 08/31/2022 No Yes I63.9 Cerebral infarction, unspecified 08/31/2022 No Yes Inactive Problems Resolved Problems ICD-10 Code Description Active Date Resolved Date L97.821 Non-pressure chronic ulcer of other part of left lower leg limited to breakdown of skin 11/05/2022 11/05/2022 Electronic Signature(s) Signed: 05/11/2023 10:20:01 AM By: Duanne Guess MD FACS Entered By: Duanne Guess on 05/11/2023 07:20:01 Cahoon, Legrand A ([id number]) [id number] Page 6 of 11 -------------------------------------------------------------------------------- Progress Note Details Patient Name: Date of Service: [place], [name] 05/11/2023 10:00 A M Medical Record Number: [id number] Patient Account Number: [id number] Date of Birth/Sex: Treating RN: 1956/04/26 (67 y.o. M) Primary Care Provider: Arva Chafe Other Clinician: Referring Provider: Treating Provider/Extender: [name] in Treatment: 36 Subjective Chief Complaint Information obtained from Patient 05/14/2020; patient is here for review of an abrasion injury on the left lateral calf 08/31/2022: [name] (1st metatarsal base, plantar) History of Present Illness (HPI) ADMISSION 05/14/2020; this is a 67 year old man with multiple medical issues. Predominantly he has type 2 diabetes with a history of peripheral neuropathy and also history of fairly significant PAD. He had a left superficial femoral to posterior tibial artery bypass in February 2017 he also had an atherectomy and angioplasty by Dr. [name] of the right popliteal artery in 2016. He is supposed to be getting arterial studies annually however this was interrupted last year because of the pandemic. He tells [place] he was at  [facility] 2 weeks ago was getting out of of the scooter and traumatized his left lateral lower leg. There was a lot of bleeding as the patient  Instructions: Apply Zinc Oxide to periwound with each dressing change Topical: Gentamicin 1 x Per Week/30 Days Discharge Instructions: As directed by physician Topical: Mupirocin Ointment 1 x Per Week/30 Days Discharge Instructions: Apply Mupirocin (Bactroban) as  instructed Prim Dressing: Maxorb Extra Ag+ Alginate Dressing, 2x2 (in/in) 1 x Per Week/30 Days ary Discharge Instructions: Apply to wound bed as instructed Secondary Dressing: Woven Gauze Sponge, Non-Sterile 4x4 in 1 x Per Week/30 Days Discharge Instructions: Apply over primary dressing as directed. Secondary Dressing: Zetuvit Plus 4x8 in 1 x Per Week/30 Days Discharge Instructions: Apply over primary dressing as directed. Secured With: [employer], 4.5x3.1 (in/yd) 1 x Per Week/30 Days Discharge Instructions: Secure with Kerlix as directed. Secured With: 33M Medipore H Soft Cloth Surgical T ape, 4 x 10 (in/yd) 1 x Per Week/30 Days Discharge Instructions: Secure with tape as directed. Patient Medications llergies: No Known Drug Allergies A Notifications Medication Indication Start End 05/11/2023 lidocaine DOSE topical 4 % cream - cream topical Electronic Signature(s) Signed: 05/11/2023 10:55:53 AM By: Duanne Guess MD FACS Entered By: Duanne Guess on 05/11/2023 07:24:22 Acres, Yorel A ([id number]) [id number] Page 5 of 11 -------------------------------------------------------------------------------- Problem List Details Patient Name: Date of Service: [place], [name] 05/11/2023 10:00 A M Medical Record Number: [id number] Patient Account Number: [id number] Date of Birth/Sex: Treating RN: 02-20-1956 (67 y.o. M) Primary Care Provider: Arva Chafe Other Clinician: Referring Provider: Treating Provider/Extender: [name] in Treatment: 36 Active Problems ICD-10 Encounter Code Description Active Date MDM Diagnosis L97.512 Non-pressure chronic ulcer of other part of right foot with fat layer exposed 08/31/2022 No Yes R60.0 Localized edema 05/04/2023 No Yes I73.9 Peripheral vascular disease, unspecified 08/31/2022 No Yes I25.10 Atherosclerotic heart disease of native coronary artery without angina pectoris  08/31/2022 No Yes I10 Essential (primary) hypertension 08/31/2022 No Yes E11.65 Type 2 diabetes mellitus with hyperglycemia 08/31/2022 No Yes E11.40 Type 2 diabetes mellitus with diabetic neuropathy, unspecified 08/31/2022 No Yes I63.9 Cerebral infarction, unspecified 08/31/2022 No Yes Inactive Problems Resolved Problems ICD-10 Code Description Active Date Resolved Date L97.821 Non-pressure chronic ulcer of other part of left lower leg limited to breakdown of skin 11/05/2022 11/05/2022 Electronic Signature(s) Signed: 05/11/2023 10:20:01 AM By: Duanne Guess MD FACS Entered By: Duanne Guess on 05/11/2023 07:20:01 Cahoon, Legrand A ([id number]) [id number] Page 6 of 11 -------------------------------------------------------------------------------- Progress Note Details Patient Name: Date of Service: [place], [name] 05/11/2023 10:00 A M Medical Record Number: [id number] Patient Account Number: [id number] Date of Birth/Sex: Treating RN: 1956/04/26 (67 y.o. M) Primary Care Provider: Arva Chafe Other Clinician: Referring Provider: Treating Provider/Extender: [name] in Treatment: 36 Subjective Chief Complaint Information obtained from Patient 05/14/2020; patient is here for review of an abrasion injury on the left lateral calf 08/31/2022: [name] (1st metatarsal base, plantar) History of Present Illness (HPI) ADMISSION 05/14/2020; this is a 67 year old man with multiple medical issues. Predominantly he has type 2 diabetes with a history of peripheral neuropathy and also history of fairly significant PAD. He had a left superficial femoral to posterior tibial artery bypass in February 2017 he also had an atherectomy and angioplasty by Dr. [name] of the right popliteal artery in 2016. He is supposed to be getting arterial studies annually however this was interrupted last year because of the pandemic. He tells [place] he was at  [facility] 2 weeks ago was getting out of of the scooter and traumatized his left lateral lower leg. There was a lot of bleeding as the patient  Bradeen, Alvon A ([id number]) [id number] Page 1 of 11 Visit Report for 05/11/2023 Chief Complaint Document Details Patient Name: Date of Service: [place], [name] 05/11/2023 10:00 A M Medical Record Number: [id number] Patient Account Number: [id number] Date of Birth/Sex: Treating RN: 02/13/1956 (67 y.o. M) Primary Care Provider: Arva Chafe Other Clinician: Referring Provider: Treating Provider/Extender: [name] in Treatment: 36 Information Obtained from: Patient Chief Complaint 05/14/2020; patient is here for review of an abrasion injury on the left lateral calf 08/31/2022: [name] (1st metatarsal base, plantar) Electronic Signature(s) Signed: 05/11/2023 10:20:16 AM By: Duanne Guess MD FACS Entered By: Duanne Guess on 05/11/2023 07:20:16 -------------------------------------------------------------------------------- HPI Details Patient Name: Date of Service: [name], [name] 05/11/2023 10:00 A M Medical Record Number: [id number] Patient Account Number: [id number] Date of Birth/Sex: Treating RN: [birth year] (67 y.o. M) Primary Care Provider: Arva Chafe Other Clinician: Referring Provider: Treating Provider/Extender: [name] in Treatment: 36 History of Present Illness HPI Description: ADMISSION 05/14/2020; this is a 67 year old man with multiple medical issues. Predominantly he has type 2 diabetes with a history of peripheral neuropathy and also history of fairly significant PAD. He had a left superficial femoral to posterior tibial artery bypass in February 2017 he also had an atherectomy and angioplasty by Dr. [name] of the right popliteal artery in 2016. He is supposed to be getting arterial studies annually however this was interrupted last year because of the pandemic. He tells [place] he was at [facility] 2 weeks ago was getting out of of the scooter and traumatized his  left lateral lower leg. There was a lot of bleeding as the patient is on Plavix and Eliquis. They have been dressing this with Neosporin and doing a fairly good job. Wound measures 2.5 x 3.5 it does not have any depth he does not have a wound history in his legs outside of surgery however he does have chronic edema and skin changes suggestive of chronic venous disease possibly some degree of lymphedema as well. Past medical history includes type 2 diabetes with peripheral neuropathy and gait instability, lumbar spondylosis, obstructive sleep apnea, history of a left pontine CVA, basal cell skin cancer, atrial fibrillation on anticoagulation, significant PAD as noted with a left superficial femoral to posterior tibial bypass in February 2017 and a right popliteal atherectomy and angioplasty by Dr. [name] in 30th 2016. He also has a history of coronary artery disease with an MI in 2002 hypertension hyperlipidemia and heart failure with preserved ejection fraction His last arterial studies I can see in epic were on 03/10/2018 this showed a right ABI of 0.69 and a right TBI of 0.5 with monophasic waveforms on the right. On the left his ABI was 1.20 with a TBI of 0.92 and triphasic waveforms. He has not had arterial studies since. Our nurse in the clinic got an ABI on the left of 1.1 11/2; left anterior leg wound in the setting of chronic venous insufficiency. Wound was initially trauma. We have been using Hydrofera Blue under compression he has home health.. The wound looks a lot better today with improvement in surface area 11/16; left anterior leg wound in the setting of chronic venous insufficiency. Wound was initially trauma. We have been using Hydrofera Blue under compression. The patient is closed today. He is supposed to follow-up with vein and vascular with regards to arterial insufficiency nevertheless his leg wounds are 12/3; apparently 2 weeks ago when they were putting on their stockings they

## 2023-05-11 NOTE — Progress Notes (Signed)
[name], [name] ([id number]) [id number] Page 1 of 7 Visit Report for 05/11/2023 Arrival Information Details Patient Name: Date of Service: [name]. 05/11/2023 10:00 [name] Medical Record Number: [id number] Patient Account Number: [id number] Date of Birth/Sex: Treating RN: February 20, 1956 (67 y.o. [name]) Primary Care [name]: [name] Other Clinician: Referring [name]: Treating [name] in Treatment: 36 Visit Information History Since Last Visit Added or deleted any medications: No Patient Arrived: Ambulatory Any new allergies or adverse reactions: No Arrival Time: 09:47 Had [name] fall or experienced change in No Accompanied By: self activities of daily living that may affect Transfer Assistance: None risk of falls: Patient Identification Verified: Yes Signs or symptoms of abuse/neglect since last visito No Secondary Verification Process Completed: Yes Hospitalized since last visit: No Patient Requires Transmission-Based Precautions: No Implantable device outside of the clinic excluding No Patient Has Alerts: No cellular tissue based products placed in the center since last visit: Pain Present Now: No Electronic Signature(s) Signed: 05/11/2023 10:30:24 AM By: [name] Entered By: [name] on 05/11/2023 06:47:57 -------------------------------------------------------------------------------- Compression Therapy Details Patient Name: Date of Service: [name]. 05/11/2023 10:00 [name] Medical Record Number: [id number] Patient Account Number: [id number] Date of Birth/Sex: Treating RN: Jul 18, 1956 (67 y.o. [name] Primary Care Tavonna Worthington: [name] Other Clinician: Referring Armina Galloway: Treating [place] Wanner/Extender: [name] in Treatment: 36 Compression Therapy Performed for Wound Assessment: NonWound Condition Lymphedema - Left Leg Performed By:  Clinician [name] Bruin, RN Compression Type: Double Layer Post Procedure Diagnosis Same as Pre-procedure Electronic Signature(s) Signed: 05/11/2023 3:45:49 PM By: [name] By: [name] on 05/11/2023 07:15:54 Encounter Discharge Information Details -------------------------------------------------------------------------------- [name] ([id number]) [phone].pdf Page 2 of 7 Patient Name: Date of Service: [name]. 05/11/2023 10:00 [name] Medical Record Number: [id number] Patient Account Number: [id number] Date of Birth/Sex: Treating RN: 23-Oct-1955 (67 y.o. [name] Primary Care Pinchas Reither: [name] Other Clinician: Referring Federick Levene: Treating Jahlisa Rossitto/Extender: [name] in Treatment: 36 Encounter Discharge Information Items Discharge Condition: Stable Ambulatory Status: Ambulatory Discharge Destination: Home Transportation: Private Auto Accompanied By: self Schedule Follow-up Appointment: Yes Clinical Summary of Care: Patient Declined Electronic Signature(s) Signed: 05/11/2023 3:45:49 PM By: [name] By: [name] on 05/11/2023 07:38:29 -------------------------------------------------------------------------------- Lower Extremity Assessment Details Patient Name: Date of Service: [name]. 05/11/2023 10:00 [name] Medical Record Number: [id number] Patient Account Number: [id number] Date of Birth/Sex: Treating RN: 1955/11/29 (67 y.o. [name] Primary Care [name]: [name] Other Clinician: Referring [name]: Treating Itai Barbian/Extender: [name] in Treatment: 36 Edema Assessment Assessed: [name]: No] [name]: No] Edema: [Left: Ye] [Right: s] Calf Left: Right: Point of Measurement: From Medial Instep 42 cm 39 cm Ankle Left: Right: Point of Measurement: From Medial Instep 30  cm 29 cm Vascular Assessment Extremity colors, hair growth, and conditions: Extremity Color: [Left:Red] [Right:Normal] Hair Growth on Extremity: [Left:No] [Right:Yes] Temperature of Extremity: [Left:Warm] [Right:Warm] Capillary Refill: [Left:< 3 seconds] [Right:< 3 seconds] Dependent Rubor: [Left:No No] [Right:No No] Electronic Signature(s) Signed: 05/11/2023 3:45:49 PM By: [name] Entered By: [name] on 05/11/2023 07:09:35 [name] ([id number]) [id number] Page 3 of 7 -------------------------------------------------------------------------------- Multi Wound Chart Details Patient Name: Date of Service: [name]. 05/11/2023 10:00 [name] Medical Record Number: [id number] Patient Account Number: [id number] Date of Birth/Sex: Treating RN: 13-Nov-1955 (67 y.o. [name]) Primary Care Dillyn Menna: [name] Other Clinician: Referring [name]: Treating [name]  By: [name] Entered By: [name] on 05/11/2023 06:48:26 -------------------------------------------------------------------------------- Patient/Caregiver Education Details Patient Name: Date of Service: [name], [place] [name]  [name]. 10/22/2024andnbsp10:00 [name] Medical Record Number: [id number] Patient Account Number: [id number] Date of Birth/Gender: Treating RN: 01-Feb-1956 (67 y.o. [name] Primary Care Physician: [name] Other Clinician: Referring Physician: Treating Physician/Extender: [name] in Treatment: 36 Education Assessment Education Provided To: Patient Education Topics Provided Wound/Skin Impairment: Methods: Explain/Verbal Responses: Reinforcements needed, State content correctly Electronic Signature(s) Signed: 05/11/2023 3:45:49 PM By: [name] Entered By: [name] on 05/11/2023 07:16:50 [name] ([id number]) [id number] Page 6 of 7 -------------------------------------------------------------------------------- Wound Assessment Details Patient Name: Date of Service: [name]. 05/11/2023 10:00 [name] Medical Record Number: [id number] Patient Account Number: [id number] Date of Birth/Sex: Treating RN: [birth year] (67 y.o. [name]) Primary Care Janay Canan: [name] Other Clinician: Referring Katerina Zurn: Treating Patryck Kilgore/Extender: [name] in Treatment: 36 Wound Status Wound Number: 2 Primary Diabetic Wound/Ulcer of the Lower Extremity Etiology: Wound Location: Right, Plantar Foot Wound Open Wounding Event: Gradually Appeared Status: Date Acquired: 07/08/2022 Comorbid Sleep Apnea, Arrhythmia, Coronary Artery Disease, Deep Vein Weeks Of Treatment: 36 History: Thrombosis, Hypertension, Myocardial Infarction, Peripheral Arterial Clustered Wound: No Disease, Type II Diabetes, Neuropathy Photos Wound Measurements Length: (cm) 0.3 Width: (cm) 0.3 Depth: (cm) 0.1 Area: (cm) 0.071 Volume: (cm) 0.007 % Reduction in Area: 97.2% % Reduction in Volume: 97.3% Epithelialization: Medium (34-66%) Tunneling: Yes Position (o'clock): 9 Maximum Distance: (cm)  0.5 Undermining: No Wound Description Classification: Grade 1 Wound Margin: Distinct, outline attached Exudate Amount: Medium Exudate Type: Serosanguineous Exudate Color: red, brown Foul Odor After Cleansing: No Slough/Fibrino Yes Wound Bed Granulation Amount: Large (67-100%) Exposed Structure Granulation Quality: Red Fascia Exposed: No Necrotic Amount: Small (1-33%) Fat Layer (Subcutaneous Tissue) Exposed: Yes Necrotic Quality: Adherent Slough Tendon Exposed: No Muscle Exposed: No Joint Exposed: No Bone Exposed: No Periwound Skin Texture Texture Color No Abnormalities Noted: No No Abnormalities Noted: Yes Callus: Yes Temperature / Pain Temperature: No Abnormality Moisture No Abnormalities Noted: Yes Treatment Notes Wound #2 (Foot) Wound Laterality: Plantar, Right Crumby, Daquawn [name] ([id number]) [id number] Page 7 of 7 Cleanser Soap and Water Discharge Instruction: May shower and wash wound with dial antibacterial soap and water prior to dressing change. Wound Cleanser Discharge Instruction: Cleanse the wound with wound cleanser prior to applying [name] clean dressing using gauze sponges, not tissue or cotton balls. Peri-Wound Care Zinc Oxide Ointment 30g tube Discharge Instruction: Apply Zinc Oxide to periwound with each dressing change Topical Gentamicin Discharge Instruction: As directed by physician Mupirocin Ointment Discharge Instruction: Apply Mupirocin (Bactroban) as instructed Primary Dressing Maxorb Extra Ag+ Alginate Dressing, 2x2 (in/in) Discharge Instruction: Apply to wound bed as instructed Secondary Dressing Woven Gauze Sponge, Non-Sterile 4x4 in Discharge Instruction: Apply over primary dressing as directed. Zetuvit Plus 4x8 in Discharge Instruction: Apply over primary dressing as directed. Secured With [employer], 4.5x3.1 (in/yd) Discharge Instruction: Secure with Kerlix as directed. 2M Medipore H Soft Cloth Surgical T  ape, 4 x 10 (in/yd) Discharge Instruction: Secure with tape as directed. Compression Wrap Compression Stockings Add-Ons Electronic Signature(s) Signed: 05/11/2023 3:45:49 PM By: [name] Entered By: [name] on 05/11/2023 07:12:02 -------------------------------------------------------------------------------- Vitals Details Patient Name: Date of Service: [name]. 05/11/2023 10:00 [name] Medical Record Number: [id number] Patient Account Number: [id number] Date of Birth/Sex: Treating RN: 06-10-1956 (67 y.o. [name]) Primary Care Sorah Falkenstein: [name] Other Clinician: Referring Lubna Stegeman: Treating Jerrit Horen/Extender: Ocie Doyne  [name], [name] ([id number]) [id number] Page 1 of 7 Visit Report for 05/11/2023 Arrival Information Details Patient Name: Date of Service: [name]. 05/11/2023 10:00 [name] Medical Record Number: [id number] Patient Account Number: [id number] Date of Birth/Sex: Treating RN: February 20, 1956 (67 y.o. [name]) Primary Care [name]: [name] Other Clinician: Referring [name]: Treating [name] in Treatment: 36 Visit Information History Since Last Visit Added or deleted any medications: No Patient Arrived: Ambulatory Any new allergies or adverse reactions: No Arrival Time: 09:47 Had [name] fall or experienced change in No Accompanied By: self activities of daily living that may affect Transfer Assistance: None risk of falls: Patient Identification Verified: Yes Signs or symptoms of abuse/neglect since last visito No Secondary Verification Process Completed: Yes Hospitalized since last visit: No Patient Requires Transmission-Based Precautions: No Implantable device outside of the clinic excluding No Patient Has Alerts: No cellular tissue based products placed in the center since last visit: Pain Present Now: No Electronic Signature(s) Signed: 05/11/2023 10:30:24 AM By: [name] Entered By: [name] on 05/11/2023 06:47:57 -------------------------------------------------------------------------------- Compression Therapy Details Patient Name: Date of Service: [name]. 05/11/2023 10:00 [name] Medical Record Number: [id number] Patient Account Number: [id number] Date of Birth/Sex: Treating RN: Jul 18, 1956 (67 y.o. [name] Primary Care Tavonna Worthington: [name] Other Clinician: Referring Armina Galloway: Treating [place] Wanner/Extender: [name] in Treatment: 36 Compression Therapy Performed for Wound Assessment: NonWound Condition Lymphedema - Left Leg Performed By:  Clinician [name] Bruin, RN Compression Type: Double Layer Post Procedure Diagnosis Same as Pre-procedure Electronic Signature(s) Signed: 05/11/2023 3:45:49 PM By: [name] By: [name] on 05/11/2023 07:15:54 Encounter Discharge Information Details -------------------------------------------------------------------------------- [name] ([id number]) [phone].pdf Page 2 of 7 Patient Name: Date of Service: [name]. 05/11/2023 10:00 [name] Medical Record Number: [id number] Patient Account Number: [id number] Date of Birth/Sex: Treating RN: 23-Oct-1955 (67 y.o. [name] Primary Care Pinchas Reither: [name] Other Clinician: Referring Federick Levene: Treating Jahlisa Rossitto/Extender: [name] in Treatment: 36 Encounter Discharge Information Items Discharge Condition: Stable Ambulatory Status: Ambulatory Discharge Destination: Home Transportation: Private Auto Accompanied By: self Schedule Follow-up Appointment: Yes Clinical Summary of Care: Patient Declined Electronic Signature(s) Signed: 05/11/2023 3:45:49 PM By: [name] By: [name] on 05/11/2023 07:38:29 -------------------------------------------------------------------------------- Lower Extremity Assessment Details Patient Name: Date of Service: [name]. 05/11/2023 10:00 [name] Medical Record Number: [id number] Patient Account Number: [id number] Date of Birth/Sex: Treating RN: 1955/11/29 (67 y.o. [name] Primary Care [name]: [name] Other Clinician: Referring [name]: Treating Itai Barbian/Extender: [name] in Treatment: 36 Edema Assessment Assessed: [name]: No] [name]: No] Edema: [Left: Ye] [Right: s] Calf Left: Right: Point of Measurement: From Medial Instep 42 cm 39 cm Ankle Left: Right: Point of Measurement: From Medial Instep 30  cm 29 cm Vascular Assessment Extremity colors, hair growth, and conditions: Extremity Color: [Left:Red] [Right:Normal] Hair Growth on Extremity: [Left:No] [Right:Yes] Temperature of Extremity: [Left:Warm] [Right:Warm] Capillary Refill: [Left:< 3 seconds] [Right:< 3 seconds] Dependent Rubor: [Left:No No] [Right:No No] Electronic Signature(s) Signed: 05/11/2023 3:45:49 PM By: [name] Entered By: [name] on 05/11/2023 07:09:35 [name] ([id number]) [id number] Page 3 of 7 -------------------------------------------------------------------------------- Multi Wound Chart Details Patient Name: Date of Service: [name]. 05/11/2023 10:00 [name] Medical Record Number: [id number] Patient Account Number: [id number] Date of Birth/Sex: Treating RN: 13-Nov-1955 (67 y.o. [name]) Primary Care Dillyn Menna: [name] Other Clinician: Referring [name]: Treating [name]  [name], [name] ([id number]) [id number] Page 1 of 7 Visit Report for 05/11/2023 Arrival Information Details Patient Name: Date of Service: [name]. 05/11/2023 10:00 [name] Medical Record Number: [id number] Patient Account Number: [id number] Date of Birth/Sex: Treating RN: February 20, 1956 (67 y.o. [name]) Primary Care [name]: [name] Other Clinician: Referring [name]: Treating [name] in Treatment: 36 Visit Information History Since Last Visit Added or deleted any medications: No Patient Arrived: Ambulatory Any new allergies or adverse reactions: No Arrival Time: 09:47 Had [name] fall or experienced change in No Accompanied By: self activities of daily living that may affect Transfer Assistance: None risk of falls: Patient Identification Verified: Yes Signs or symptoms of abuse/neglect since last visito No Secondary Verification Process Completed: Yes Hospitalized since last visit: No Patient Requires Transmission-Based Precautions: No Implantable device outside of the clinic excluding No Patient Has Alerts: No cellular tissue based products placed in the center since last visit: Pain Present Now: No Electronic Signature(s) Signed: 05/11/2023 10:30:24 AM By: [name] Entered By: [name] on 05/11/2023 06:47:57 -------------------------------------------------------------------------------- Compression Therapy Details Patient Name: Date of Service: [name]. 05/11/2023 10:00 [name] Medical Record Number: [id number] Patient Account Number: [id number] Date of Birth/Sex: Treating RN: Jul 18, 1956 (67 y.o. [name] Primary Care Tavonna Worthington: [name] Other Clinician: Referring Armina Galloway: Treating [place] Wanner/Extender: [name] in Treatment: 36 Compression Therapy Performed for Wound Assessment: NonWound Condition Lymphedema - Left Leg Performed By:  Clinician [name] Bruin, RN Compression Type: Double Layer Post Procedure Diagnosis Same as Pre-procedure Electronic Signature(s) Signed: 05/11/2023 3:45:49 PM By: [name] By: [name] on 05/11/2023 07:15:54 Encounter Discharge Information Details -------------------------------------------------------------------------------- [name] ([id number]) [phone].pdf Page 2 of 7 Patient Name: Date of Service: [name]. 05/11/2023 10:00 [name] Medical Record Number: [id number] Patient Account Number: [id number] Date of Birth/Sex: Treating RN: 23-Oct-1955 (67 y.o. [name] Primary Care Pinchas Reither: [name] Other Clinician: Referring Federick Levene: Treating Jahlisa Rossitto/Extender: [name] in Treatment: 36 Encounter Discharge Information Items Discharge Condition: Stable Ambulatory Status: Ambulatory Discharge Destination: Home Transportation: Private Auto Accompanied By: self Schedule Follow-up Appointment: Yes Clinical Summary of Care: Patient Declined Electronic Signature(s) Signed: 05/11/2023 3:45:49 PM By: [name] By: [name] on 05/11/2023 07:38:29 -------------------------------------------------------------------------------- Lower Extremity Assessment Details Patient Name: Date of Service: [name]. 05/11/2023 10:00 [name] Medical Record Number: [id number] Patient Account Number: [id number] Date of Birth/Sex: Treating RN: 1955/11/29 (67 y.o. [name] Primary Care [name]: [name] Other Clinician: Referring [name]: Treating Itai Barbian/Extender: [name] in Treatment: 36 Edema Assessment Assessed: [name]: No] [name]: No] Edema: [Left: Ye] [Right: s] Calf Left: Right: Point of Measurement: From Medial Instep 42 cm 39 cm Ankle Left: Right: Point of Measurement: From Medial Instep 30  cm 29 cm Vascular Assessment Extremity colors, hair growth, and conditions: Extremity Color: [Left:Red] [Right:Normal] Hair Growth on Extremity: [Left:No] [Right:Yes] Temperature of Extremity: [Left:Warm] [Right:Warm] Capillary Refill: [Left:< 3 seconds] [Right:< 3 seconds] Dependent Rubor: [Left:No No] [Right:No No] Electronic Signature(s) Signed: 05/11/2023 3:45:49 PM By: [name] Entered By: [name] on 05/11/2023 07:09:35 [name] ([id number]) [id number] Page 3 of 7 -------------------------------------------------------------------------------- Multi Wound Chart Details Patient Name: Date of Service: [name]. 05/11/2023 10:00 [name] Medical Record Number: [id number] Patient Account Number: [id number] Date of Birth/Sex: Treating RN: 13-Nov-1955 (67 y.o. [name]) Primary Care Dillyn Menna: [name] Other Clinician: Referring [name]: Treating [name]

## 2023-05-12 ENCOUNTER — Ambulatory Visit (HOSPITAL_BASED_OUTPATIENT_CLINIC_OR_DEPARTMENT_OTHER): Payer: Medicare HMO | Admitting: Nurse Practitioner

## 2023-05-12 ENCOUNTER — Other Ambulatory Visit: Payer: Self-pay | Admitting: Nurse Practitioner

## 2023-05-12 ENCOUNTER — Other Ambulatory Visit: Payer: Self-pay | Admitting: Internal Medicine

## 2023-05-18 ENCOUNTER — Encounter (HOSPITAL_BASED_OUTPATIENT_CLINIC_OR_DEPARTMENT_OTHER): Payer: Medicare HMO | Admitting: General Surgery

## 2023-05-18 DIAGNOSIS — E11621 Type 2 diabetes mellitus with foot ulcer: Secondary | ICD-10-CM | POA: Diagnosis not present

## 2023-05-18 DIAGNOSIS — L97512 Non-pressure chronic ulcer of other part of right foot with fat layer exposed: Secondary | ICD-10-CM | POA: Diagnosis not present

## 2023-05-18 NOTE — Progress Notes (Signed)
Lidocaine 5% applied to wound bed Bathing/ Shower/ Hygiene May shower with protection but do not get wound dressing(s) wet. Protect dressing(s) with water repellant cover (for example, large plastic bag) or [name] cast cover and may then take shower. Off-Loading Total Contact Cast to Right Lower Extremity Removable cast walker boot to: - right leg Non Wound Condition Left Lower Extremity Other Non Wound Condition Orders/Instructions: - compression wrap urgo lite to left leg Wound Treatment Wound #2 - Foot Wound Laterality: Plantar, Right Cleanser: Soap and Water 1 x Per Week/30 Days Discharge Instructions: May shower and wash wound with dial antibacterial soap and water prior to dressing change. Cleanser: Wound Cleanser 1 x Per Week/30 Days [name], [name] ([id number]) [phone].pdf Page 5 of 12 Discharge Instructions: Cleanse the wound with wound cleanser prior to applying [name] clean dressing using gauze sponges, not tissue or cotton balls. Peri-Wound Care: Zinc Oxide Ointment 30g tube 1 x Per Week/30 Days Discharge Instructions: Apply Zinc Oxide to periwound with each dressing change Topical: Gentamicin 1 x Per Week/30 Days Discharge Instructions: As directed by physician Topical: Mupirocin Ointment 1 x Per Week/30 Days Discharge Instructions: Apply Mupirocin (Bactroban) as instructed Prim Dressing: Maxorb Extra Ag+ Alginate Dressing, 2x2 (in/in) 1 x Per Week/30 Days ary Discharge Instructions: Apply to wound bed as instructed Secondary Dressing: Woven Gauze Sponge, Non-Sterile 4x4 in 1 x Per Week/30 Days Discharge Instructions: Apply over primary dressing as directed. Secondary Dressing: Zetuvit Plus 4x8 in 1 x Per Week/30 Days Discharge Instructions: Apply over primary dressing as directed. Secured With: [employer], 4.5x3.1 (in/yd) 1 x Per Week/30 Days Discharge Instructions: Secure with Kerlix as directed. Secured With: 59M Medipore H Soft  Cloth Surgical T ape, 4 x 10 (in/yd) 1 x Per Week/30 Days Discharge Instructions: Secure with tape as directed. Patient Medications llergies: No Known Drug Allergies [name] Notifications Medication Indication Start End 05/18/2023 lidocaine DOSE topical 5 % ointment - ointment topical Electronic Signature(s) Signed: 05/18/2023 9:44:36 AM By: Duanne Guess MD FACS Entered By: Duanne Guess on 05/18/2023 09:30:27 -------------------------------------------------------------------------------- Problem List Details Patient Name: Date of Service: [name] Medical Record Number: [id number] Patient Account Number: [id number] Date of Birth/Sex: Treating RN: September 28, 1955 (67 y.o. [name]) Primary Care Provider: Arva Chafe Other Clinician: Referring Provider: Treating Provider/Extender: [name] in Treatment: 37 Active Problems ICD-10 Encounter Code Description Active Date MDM Diagnosis L97.512 Non-pressure chronic ulcer of other part of right foot with fat layer exposed 08/31/2022 No Yes R60.0 Localized edema 05/04/2023 No Yes I73.9 Peripheral vascular disease, unspecified 08/31/2022 No Yes [name], [name] ([id number]) [phone].pdf Page 6 of 12 I25.10 Atherosclerotic heart disease of native coronary artery without angina pectoris 08/31/2022 No Yes I10 Essential (primary) hypertension 08/31/2022 No Yes E11.65 Type 2 diabetes mellitus with hyperglycemia 08/31/2022 No Yes E11.40 Type 2 diabetes mellitus with diabetic neuropathy, unspecified 08/31/2022 No Yes I63.9 Cerebral infarction, unspecified 08/31/2022 No Yes Inactive Problems Resolved Problems ICD-10 Code Description Active Date Resolved Date L97.821 Non-pressure chronic ulcer of other part of left lower leg limited to breakdown of skin 11/05/2022 11/05/2022 Electronic Signature(s) Signed: 05/18/2023 9:27:17 AM By: Duanne Guess MD FACS Entered By:  Duanne Guess on 05/18/2023 09:27:17 -------------------------------------------------------------------------------- Progress Note Details Patient Name: Date of Service: [name] Medical Record Number: [id number] Patient Account Number: [id number] Date of Birth/Sex: Treating RN: 1956-01-27 (67 y.o. [name]) Primary Care Provider: Arva Chafe Other Clinician: Referring Provider: Treating Provider/Extender: Tennis Must,  Pneumococcal Vaccine: Received Pneumococcal Vaccination: Yes Received Pneumococcal Vaccination On or After 60th Birthday: Yes Implantable Devices None [name], [name] ([id number]) [phone].pdf Page 11 of 12 Hospitalization / Surgery History Type of Hospitalization/Surgery colonoscopy polypectomy peripheral vascular cath shoulder arthroscopy carpal tunnel release coronary artery bypass appendectomy cardiac cath coronary angioplasty thrombectomy knee arthroplasty popliteal artery stent Family and Social History Unknown History: Yes; Never smoker; Marital Status - Single; Alcohol Use: Never; Drug Use: No History; Caffeine Use: Rarely; Financial Concerns: No; Food, Clothing or Shelter Needs: No; Support System Lacking: No; Transportation Concerns: No Psychologist, prison and probation services) Signed: 05/18/2023 9:44:36 AM By: Duanne Guess MD FACS Entered By: Duanne Guess on 05/18/2023 09:28:47 -------------------------------------------------------------------------------- Total Contact Cast Details Patient Name: Date of Service: [name]. 05/18/2023 9:15 [name] [name] Medical Record Number: [id number] Patient Account Number: [id number] Date of Birth/Sex: Treating RN: [birth year] (67 y.o. [name] Primary Care Provider: Arva Chafe Other Clinician: Referring Provider: Treating Provider/Extender: [name] in Treatment: 37 T Contact Cast Applied for Wound Assessment: otal Wound #2 Right,Plantar Foot Performed By: Physician Duanne Guess, MD The following information was scribed by: Samuella Bruin The information was scribed for: Duanne Guess Post Procedure Diagnosis Same as Pre-procedure Electronic Signature(s) Signed: 05/18/2023 9:44:36 AM By: Duanne Guess MD FACS Signed: 05/18/2023 1:45:14 PM By:  Gelene Mink By: Samuella Bruin on 05/18/2023 09:22:42 -------------------------------------------------------------------------------- SuperBill Details Patient Name: Date of Service: [name]. 05/18/2023 Medical Record Number: [id number] Patient Account Number: [id number] Date of Birth/Sex: Treating RN: [birth year] (67 y.o. [name]) Primary Care Provider: Arva Chafe Other Clinician: Referring Provider: Treating Provider/Extender: [name] in Treatment: 37 Diagnosis Coding ICD-10 Codes Code Description MACKLIN, EINSTEIN [name] ([id number]) [id number] Page 12 of 12 L97.512 Non-pressure chronic ulcer of other part of right foot with fat layer exposed R60.0 Localized edema I73.9 Peripheral vascular disease, unspecified I25.10 Atherosclerotic heart disease of native coronary artery without angina pectoris I10 Essential (primary) hypertension E11.65 Type 2 diabetes mellitus with hyperglycemia E11.40 Type 2 diabetes mellitus with diabetic neuropathy, unspecified I63.9 Cerebral infarction, unspecified Facility Procedures : CPT4 Code: [id number] Description: 97597 - DEBRIDE WOUND 1ST 20 SQ CM OR < ICD-10 Diagnosis Description L97.512 Non-pressure chronic ulcer of other part of right foot with fat layer exposed Modifier: Quantity: 1 Physician Procedures : CPT4 Code Description Modifier [id number] - WC PHYS LEVEL 4 - EST PT ICD-10 Diagnosis Description L97.512 Non-pressure chronic ulcer of other part of right foot with fat layer exposed I73.9 Peripheral vascular disease, unspecified E11.40 Type 2  diabetes mellitus with diabetic neuropathy, unspecified Quantity: 1 : [id number] 97597 - WC PHYS DEBR WO ANESTH 20 SQ CM ICD-10 Diagnosis Description L97.512 Non-pressure chronic ulcer of other part of right foot with fat layer exposed Quantity: 1 Electronic Signature(s) Signed: 05/18/2023 9:31:33 AM By: Duanne Guess MD  FACS Entered By: Duanne Guess on 05/18/2023 09:31:32  Lidocaine 5% applied to wound bed Bathing/ Shower/ Hygiene May shower with protection but do not get wound dressing(s) wet. Protect dressing(s) with water repellant cover (for example, large plastic bag) or [name] cast cover and may then take shower. Off-Loading Total Contact Cast to Right Lower Extremity Removable cast walker boot to: - right leg Non Wound Condition Left Lower Extremity Other Non Wound Condition Orders/Instructions: - compression wrap urgo lite to left leg Wound Treatment Wound #2 - Foot Wound Laterality: Plantar, Right Cleanser: Soap and Water 1 x Per Week/30 Days Discharge Instructions: May shower and wash wound with dial antibacterial soap and water prior to dressing change. Cleanser: Wound Cleanser 1 x Per Week/30 Days [name], [name] ([id number]) [phone].pdf Page 5 of 12 Discharge Instructions: Cleanse the wound with wound cleanser prior to applying [name] clean dressing using gauze sponges, not tissue or cotton balls. Peri-Wound Care: Zinc Oxide Ointment 30g tube 1 x Per Week/30 Days Discharge Instructions: Apply Zinc Oxide to periwound with each dressing change Topical: Gentamicin 1 x Per Week/30 Days Discharge Instructions: As directed by physician Topical: Mupirocin Ointment 1 x Per Week/30 Days Discharge Instructions: Apply Mupirocin (Bactroban) as instructed Prim Dressing: Maxorb Extra Ag+ Alginate Dressing, 2x2 (in/in) 1 x Per Week/30 Days ary Discharge Instructions: Apply to wound bed as instructed Secondary Dressing: Woven Gauze Sponge, Non-Sterile 4x4 in 1 x Per Week/30 Days Discharge Instructions: Apply over primary dressing as directed. Secondary Dressing: Zetuvit Plus 4x8 in 1 x Per Week/30 Days Discharge Instructions: Apply over primary dressing as directed. Secured With: [employer], 4.5x3.1 (in/yd) 1 x Per Week/30 Days Discharge Instructions: Secure with Kerlix as directed. Secured With: 59M Medipore H Soft  Cloth Surgical T ape, 4 x 10 (in/yd) 1 x Per Week/30 Days Discharge Instructions: Secure with tape as directed. Patient Medications llergies: No Known Drug Allergies [name] Notifications Medication Indication Start End 05/18/2023 lidocaine DOSE topical 5 % ointment - ointment topical Electronic Signature(s) Signed: 05/18/2023 9:44:36 AM By: Duanne Guess MD FACS Entered By: Duanne Guess on 05/18/2023 09:30:27 -------------------------------------------------------------------------------- Problem List Details Patient Name: Date of Service: [name] Medical Record Number: [id number] Patient Account Number: [id number] Date of Birth/Sex: Treating RN: September 28, 1955 (67 y.o. [name]) Primary Care Provider: Arva Chafe Other Clinician: Referring Provider: Treating Provider/Extender: [name] in Treatment: 37 Active Problems ICD-10 Encounter Code Description Active Date MDM Diagnosis L97.512 Non-pressure chronic ulcer of other part of right foot with fat layer exposed 08/31/2022 No Yes R60.0 Localized edema 05/04/2023 No Yes I73.9 Peripheral vascular disease, unspecified 08/31/2022 No Yes [name], [name] ([id number]) [phone].pdf Page 6 of 12 I25.10 Atherosclerotic heart disease of native coronary artery without angina pectoris 08/31/2022 No Yes I10 Essential (primary) hypertension 08/31/2022 No Yes E11.65 Type 2 diabetes mellitus with hyperglycemia 08/31/2022 No Yes E11.40 Type 2 diabetes mellitus with diabetic neuropathy, unspecified 08/31/2022 No Yes I63.9 Cerebral infarction, unspecified 08/31/2022 No Yes Inactive Problems Resolved Problems ICD-10 Code Description Active Date Resolved Date L97.821 Non-pressure chronic ulcer of other part of left lower leg limited to breakdown of skin 11/05/2022 11/05/2022 Electronic Signature(s) Signed: 05/18/2023 9:27:17 AM By: Duanne Guess MD FACS Entered By:  Duanne Guess on 05/18/2023 09:27:17 -------------------------------------------------------------------------------- Progress Note Details Patient Name: Date of Service: [name] Medical Record Number: [id number] Patient Account Number: [id number] Date of Birth/Sex: Treating RN: 1956-01-27 (67 y.o. [name]) Primary Care Provider: Arva Chafe Other Clinician: Referring Provider: Treating Provider/Extender: Tennis Must,  Lidocaine 5% applied to wound bed Bathing/ Shower/ Hygiene May shower with protection but do not get wound dressing(s) wet. Protect dressing(s) with water repellant cover (for example, large plastic bag) or [name] cast cover and may then take shower. Off-Loading Total Contact Cast to Right Lower Extremity Removable cast walker boot to: - right leg Non Wound Condition Left Lower Extremity Other Non Wound Condition Orders/Instructions: - compression wrap urgo lite to left leg Wound Treatment Wound #2 - Foot Wound Laterality: Plantar, Right Cleanser: Soap and Water 1 x Per Week/30 Days Discharge Instructions: May shower and wash wound with dial antibacterial soap and water prior to dressing change. Cleanser: Wound Cleanser 1 x Per Week/30 Days [name], [name] ([id number]) [phone].pdf Page 5 of 12 Discharge Instructions: Cleanse the wound with wound cleanser prior to applying [name] clean dressing using gauze sponges, not tissue or cotton balls. Peri-Wound Care: Zinc Oxide Ointment 30g tube 1 x Per Week/30 Days Discharge Instructions: Apply Zinc Oxide to periwound with each dressing change Topical: Gentamicin 1 x Per Week/30 Days Discharge Instructions: As directed by physician Topical: Mupirocin Ointment 1 x Per Week/30 Days Discharge Instructions: Apply Mupirocin (Bactroban) as instructed Prim Dressing: Maxorb Extra Ag+ Alginate Dressing, 2x2 (in/in) 1 x Per Week/30 Days ary Discharge Instructions: Apply to wound bed as instructed Secondary Dressing: Woven Gauze Sponge, Non-Sterile 4x4 in 1 x Per Week/30 Days Discharge Instructions: Apply over primary dressing as directed. Secondary Dressing: Zetuvit Plus 4x8 in 1 x Per Week/30 Days Discharge Instructions: Apply over primary dressing as directed. Secured With: [employer], 4.5x3.1 (in/yd) 1 x Per Week/30 Days Discharge Instructions: Secure with Kerlix as directed. Secured With: 59M Medipore H Soft  Cloth Surgical T ape, 4 x 10 (in/yd) 1 x Per Week/30 Days Discharge Instructions: Secure with tape as directed. Patient Medications llergies: No Known Drug Allergies [name] Notifications Medication Indication Start End 05/18/2023 lidocaine DOSE topical 5 % ointment - ointment topical Electronic Signature(s) Signed: 05/18/2023 9:44:36 AM By: Duanne Guess MD FACS Entered By: Duanne Guess on 05/18/2023 09:30:27 -------------------------------------------------------------------------------- Problem List Details Patient Name: Date of Service: [name] Medical Record Number: [id number] Patient Account Number: [id number] Date of Birth/Sex: Treating RN: September 28, 1955 (67 y.o. [name]) Primary Care Provider: Arva Chafe Other Clinician: Referring Provider: Treating Provider/Extender: [name] in Treatment: 37 Active Problems ICD-10 Encounter Code Description Active Date MDM Diagnosis L97.512 Non-pressure chronic ulcer of other part of right foot with fat layer exposed 08/31/2022 No Yes R60.0 Localized edema 05/04/2023 No Yes I73.9 Peripheral vascular disease, unspecified 08/31/2022 No Yes [name], [name] ([id number]) [phone].pdf Page 6 of 12 I25.10 Atherosclerotic heart disease of native coronary artery without angina pectoris 08/31/2022 No Yes I10 Essential (primary) hypertension 08/31/2022 No Yes E11.65 Type 2 diabetes mellitus with hyperglycemia 08/31/2022 No Yes E11.40 Type 2 diabetes mellitus with diabetic neuropathy, unspecified 08/31/2022 No Yes I63.9 Cerebral infarction, unspecified 08/31/2022 No Yes Inactive Problems Resolved Problems ICD-10 Code Description Active Date Resolved Date L97.821 Non-pressure chronic ulcer of other part of left lower leg limited to breakdown of skin 11/05/2022 11/05/2022 Electronic Signature(s) Signed: 05/18/2023 9:27:17 AM By: Duanne Guess MD FACS Entered By:  Duanne Guess on 05/18/2023 09:27:17 -------------------------------------------------------------------------------- Progress Note Details Patient Name: Date of Service: [name] Medical Record Number: [id number] Patient Account Number: [id number] Date of Birth/Sex: Treating RN: 1956-01-27 (67 y.o. [name]) Primary Care Provider: Arva Chafe Other Clinician: Referring Provider: Treating Provider/Extender: Tennis Must,  Lidocaine 5% applied to wound bed Bathing/ Shower/ Hygiene May shower with protection but do not get wound dressing(s) wet. Protect dressing(s) with water repellant cover (for example, large plastic bag) or [name] cast cover and may then take shower. Off-Loading Total Contact Cast to Right Lower Extremity Removable cast walker boot to: - right leg Non Wound Condition Left Lower Extremity Other Non Wound Condition Orders/Instructions: - compression wrap urgo lite to left leg Wound Treatment Wound #2 - Foot Wound Laterality: Plantar, Right Cleanser: Soap and Water 1 x Per Week/30 Days Discharge Instructions: May shower and wash wound with dial antibacterial soap and water prior to dressing change. Cleanser: Wound Cleanser 1 x Per Week/30 Days [name], [name] ([id number]) [phone].pdf Page 5 of 12 Discharge Instructions: Cleanse the wound with wound cleanser prior to applying [name] clean dressing using gauze sponges, not tissue or cotton balls. Peri-Wound Care: Zinc Oxide Ointment 30g tube 1 x Per Week/30 Days Discharge Instructions: Apply Zinc Oxide to periwound with each dressing change Topical: Gentamicin 1 x Per Week/30 Days Discharge Instructions: As directed by physician Topical: Mupirocin Ointment 1 x Per Week/30 Days Discharge Instructions: Apply Mupirocin (Bactroban) as instructed Prim Dressing: Maxorb Extra Ag+ Alginate Dressing, 2x2 (in/in) 1 x Per Week/30 Days ary Discharge Instructions: Apply to wound bed as instructed Secondary Dressing: Woven Gauze Sponge, Non-Sterile 4x4 in 1 x Per Week/30 Days Discharge Instructions: Apply over primary dressing as directed. Secondary Dressing: Zetuvit Plus 4x8 in 1 x Per Week/30 Days Discharge Instructions: Apply over primary dressing as directed. Secured With: [employer], 4.5x3.1 (in/yd) 1 x Per Week/30 Days Discharge Instructions: Secure with Kerlix as directed. Secured With: 59M Medipore H Soft  Cloth Surgical T ape, 4 x 10 (in/yd) 1 x Per Week/30 Days Discharge Instructions: Secure with tape as directed. Patient Medications llergies: No Known Drug Allergies [name] Notifications Medication Indication Start End 05/18/2023 lidocaine DOSE topical 5 % ointment - ointment topical Electronic Signature(s) Signed: 05/18/2023 9:44:36 AM By: Duanne Guess MD FACS Entered By: Duanne Guess on 05/18/2023 09:30:27 -------------------------------------------------------------------------------- Problem List Details Patient Name: Date of Service: [name] Medical Record Number: [id number] Patient Account Number: [id number] Date of Birth/Sex: Treating RN: September 28, 1955 (67 y.o. [name]) Primary Care Provider: Arva Chafe Other Clinician: Referring Provider: Treating Provider/Extender: [name] in Treatment: 37 Active Problems ICD-10 Encounter Code Description Active Date MDM Diagnosis L97.512 Non-pressure chronic ulcer of other part of right foot with fat layer exposed 08/31/2022 No Yes R60.0 Localized edema 05/04/2023 No Yes I73.9 Peripheral vascular disease, unspecified 08/31/2022 No Yes [name], [name] ([id number]) [phone].pdf Page 6 of 12 I25.10 Atherosclerotic heart disease of native coronary artery without angina pectoris 08/31/2022 No Yes I10 Essential (primary) hypertension 08/31/2022 No Yes E11.65 Type 2 diabetes mellitus with hyperglycemia 08/31/2022 No Yes E11.40 Type 2 diabetes mellitus with diabetic neuropathy, unspecified 08/31/2022 No Yes I63.9 Cerebral infarction, unspecified 08/31/2022 No Yes Inactive Problems Resolved Problems ICD-10 Code Description Active Date Resolved Date L97.821 Non-pressure chronic ulcer of other part of left lower leg limited to breakdown of skin 11/05/2022 11/05/2022 Electronic Signature(s) Signed: 05/18/2023 9:27:17 AM By: Duanne Guess MD FACS Entered By:  Duanne Guess on 05/18/2023 09:27:17 -------------------------------------------------------------------------------- Progress Note Details Patient Name: Date of Service: [name] Medical Record Number: [id number] Patient Account Number: [id number] Date of Birth/Sex: Treating RN: 1956-01-27 (67 y.o. [name]) Primary Care Provider: Arva Chafe Other Clinician: Referring Provider: Treating Provider/Extender: Tennis Must,  [name], [name] ([id number]) [id number] Page 1 of 12 Visit Report for 05/18/2023 Chief Complaint Document Details Patient Name: Date of Service: Raliegh, Karpf [name]. 05/18/2023 9:15 [name] [name] Medical Record Number: [id number] Patient Account Number: [id number] Date of Birth/Sex: Treating RN: [birth year] (67 y.o. [name]) Primary Care Provider: Arva Chafe Other Clinician: Referring Provider: Treating Provider/Extender: [name] in Treatment: 37 Information Obtained from: Patient Chief Complaint 05/14/2020; patient is here for review of an abrasion injury on the left lateral calf 08/31/2022: [name] (1st metatarsal base, plantar) Electronic Signature(s) Signed: 05/18/2023 9:28:00 AM By: Duanne Guess MD FACS Entered By: Duanne Guess on 05/18/2023 09:28:00 -------------------------------------------------------------------------------- Debridement Details Patient Name: Date of Service: [name] Medical Record Number: [id number] Patient Account Number: [id number] Date of Birth/Sex: Treating RN: 31-Oct-1955 (67 y.o. [name] Primary Care Provider: Arva Chafe Other Clinician: Referring Provider: Treating Provider/Extender: [name] in Treatment: 37 Debridement Performed for Assessment: Wound #2 Right,Plantar Foot Performed By: Physician Duanne Guess, MD The following information was scribed by: Samuella Bruin The information was scribed for: Duanne Guess Debridement Type: Debridement Severity of Tissue Pre Debridement: Fat layer exposed Level of Consciousness (Pre-procedure): Awake and Alert Pre-procedure Verification/Time Out Yes - 09:23 Taken: Start Time: 09:23 Pain Control: Lidocaine 5% topical ointment Percent of Wound Bed Debrided: 100% T Area Debrided (cm): otal 0.16 Tissue and other material debrided: Non-Viable, Eschar,  Slough, Slough Level: Non-Viable Tissue Debridement Description: Selective/Open Wound Instrument: Curette Bleeding: Minimum Hemostasis Achieved: Pressure Response to Treatment: Procedure was tolerated well Level of Consciousness (Post- Awake and Alert procedure): Post Debridement Measurements of Total Wound Length: (cm) 0.2 Width: (cm) 1 Depth: (cm) 0.1 Volume: (cm) 0.016 [name], [name] ([id number]) [id number].pdf Page 2 of 12 Character of Wound/Ulcer Post Debridement: Improved Severity of Tissue Post Debridement: Fat layer exposed Post Procedure Diagnosis Same as Pre-procedure Electronic Signature(s) Signed: 05/18/2023 9:44:36 AM By: Duanne Guess MD FACS Signed: 05/18/2023 1:45:14 PM By: Samuella Bruin Entered By: Samuella Bruin on 05/18/2023 09:25:09 -------------------------------------------------------------------------------- HPI Details Patient Name: Date of Service: [name] Medical Record Number: [id number] Patient Account Number: [id number] Date of Birth/Sex: Treating RN: July 31, 1955 (67 y.o. [name]) Primary Care Provider: Arva Chafe Other Clinician: Referring Provider: Treating Provider/Extender: [name] in Treatment: 37 History of Present Illness HPI Description: ADMISSION 05/14/2020; this is [name] with multiple medical issues. Predominantly he has type 2 diabetes with [name] and also history of fairly significant PAD. He had [name] in February 2017 he also had an atherectomy and angioplasty by Dr. [name] of the right popliteal artery in 2016. He is supposed to be getting arterial studies annually however this was interrupted last year because of the pandemic. He tells [place] he was at [facility] 2 weeks ago was getting out of of the scooter and traumatized his left lateral lower leg.  There was [name] as the patient is on Plavix and Eliquis. They have been dressing this with Neosporin and doing [name] fairly good job. Wound measures 2.5 x 3.5 it does not have any depth he does not have [name] in his legs outside of surgery however he does have chronic edema and skin changes suggestive of chronic venous disease possibly some degree of lymphedema as well. Past medical history includes type 2 diabetes with peripheral neuropathy and gait  [name], [name] ([id number]) [id number] Page 1 of 12 Visit Report for 05/18/2023 Chief Complaint Document Details Patient Name: Date of Service: Raliegh, Karpf [name]. 05/18/2023 9:15 [name] [name] Medical Record Number: [id number] Patient Account Number: [id number] Date of Birth/Sex: Treating RN: [birth year] (67 y.o. [name]) Primary Care Provider: Arva Chafe Other Clinician: Referring Provider: Treating Provider/Extender: [name] in Treatment: 37 Information Obtained from: Patient Chief Complaint 05/14/2020; patient is here for review of an abrasion injury on the left lateral calf 08/31/2022: [name] (1st metatarsal base, plantar) Electronic Signature(s) Signed: 05/18/2023 9:28:00 AM By: Duanne Guess MD FACS Entered By: Duanne Guess on 05/18/2023 09:28:00 -------------------------------------------------------------------------------- Debridement Details Patient Name: Date of Service: [name] Medical Record Number: [id number] Patient Account Number: [id number] Date of Birth/Sex: Treating RN: 31-Oct-1955 (67 y.o. [name] Primary Care Provider: Arva Chafe Other Clinician: Referring Provider: Treating Provider/Extender: [name] in Treatment: 37 Debridement Performed for Assessment: Wound #2 Right,Plantar Foot Performed By: Physician Duanne Guess, MD The following information was scribed by: Samuella Bruin The information was scribed for: Duanne Guess Debridement Type: Debridement Severity of Tissue Pre Debridement: Fat layer exposed Level of Consciousness (Pre-procedure): Awake and Alert Pre-procedure Verification/Time Out Yes - 09:23 Taken: Start Time: 09:23 Pain Control: Lidocaine 5% topical ointment Percent of Wound Bed Debrided: 100% T Area Debrided (cm): otal 0.16 Tissue and other material debrided: Non-Viable, Eschar,  Slough, Slough Level: Non-Viable Tissue Debridement Description: Selective/Open Wound Instrument: Curette Bleeding: Minimum Hemostasis Achieved: Pressure Response to Treatment: Procedure was tolerated well Level of Consciousness (Post- Awake and Alert procedure): Post Debridement Measurements of Total Wound Length: (cm) 0.2 Width: (cm) 1 Depth: (cm) 0.1 Volume: (cm) 0.016 [name], [name] ([id number]) [id number] Page 2 of 12 Character of Wound/Ulcer Post Debridement: Improved Severity of Tissue Post Debridement: Fat layer exposed Post Procedure Diagnosis Same as Pre-procedure Electronic Signature(s) Signed: 05/18/2023 9:44:36 AM By: Duanne Guess MD FACS Signed: 05/18/2023 1:45:14 PM By: Samuella Bruin Entered By: Samuella Bruin on 05/18/2023 09:25:09 -------------------------------------------------------------------------------- HPI Details Patient Name: Date of Service: [name] Medical Record Number: [id number] Patient Account Number: [id number] Date of Birth/Sex: Treating RN: July 31, 1955 (67 y.o. [name]) Primary Care Provider: Arva Chafe Other Clinician: Referring Provider: Treating Provider/Extender: [name] in Treatment: 37 History of Present Illness HPI Description: ADMISSION 05/14/2020; this is [name] with multiple medical issues. Predominantly he has type 2 diabetes with [name] and also history of fairly significant PAD. He had [name] in February 2017 he also had an atherectomy and angioplasty by Dr. [name] of the right popliteal artery in 2016. He is supposed to be getting arterial studies annually however this was interrupted last year because of the pandemic. He tells [place] he was at [facility] 2 weeks ago was getting out of of the scooter and traumatized his left lateral lower leg.  There was [name] as the patient is on Plavix and Eliquis. They have been dressing this with Neosporin and doing [name] fairly good job. Wound measures 2.5 x 3.5 it does not have any depth he does not have [name] in his legs outside of surgery however he does have chronic edema and skin changes suggestive of chronic venous disease possibly some degree of lymphedema as well. Past medical history includes type 2 diabetes with peripheral neuropathy and gait  wound is [name] little bit smaller again today. He is managing very well in the peg assist offloading  insert and there has been no tissue breakdown or periwound maceration as [name] result of being out of the total contact cast. 03/16/2023: The wound measured smaller again today. He continues to do well using the peg assist offloading insert. 03/23/2023: Although the wound measured the same size today, it visually appears smaller to me. There is good granulation tissue with minimal slough. There is [name] little callus around the edges. 03/30/2023: The wound is smaller today. The granulation tissue is healthy. There is [name] little bit of callus accumulation around the edges. 04/06/2023: Unfortunately, he did not have any assistance to change his dressing during the week and he was also walking substantially more due to attending [name] family event. This has resulted in fairly significant breakdown of his wound. Much of this seems to be related to moisture accumulation. 04/13/2023: The wound looks markedly better this week after being in the total contact cast. There is no moisture-related tissue breakdown, and the wound opening is smaller. 04/20/2023: The wound is smaller again today. The periwound skin is nice and dry and there is no concern for infection. 04/27/2023: The wound continues to contract. The surface is clean. There is [name] little bit of periwound moisture, but the wound itself has not broken down at all. 05/04/2023: The wound is stable this week. He has had an increase in his left lower leg swelling, however, and he has an intact blister on the distal medial aspect of the leg, adjacent to where his saphenous vein was harvested. 05/11/2023: The wound is [name] little bit narrower from 12-6 o'clock but slightly wider from 9-3 o'clock. It is otherwise fairly clean. The blister on his left lower leg is still present but flat. Edema is improved. 05/18/2023: The blister on his left lower leg has resolved. The wound on his right first metatarsal head is smaller today. There is [name] little bit of slough, callus, and eschar  present. Moisture balance is good. Electronic Signature(s) Signed: 05/18/2023 9:28:39 AM By: Duanne Guess MD FACS Entered By: Duanne Guess on 05/18/2023 09:28:39 [name], [name] ([id number]) [id number] Page 4 of 12 -------------------------------------------------------------------------------- Physical Exam Details Patient Name: Date of Service: [name], [name]. 05/18/2023 9:15 [name] [name] Medical Record Number: [id number] Patient Account Number: [id number] Date of Birth/Sex: Treating RN: 09-Feb-1956 (67 y.o. [name]) Primary Care Provider: Arva Chafe Other Clinician: Referring Provider: Treating Provider/Extender: [name] in Treatment: 37 Constitutional Hypertensive, asymptomatic. . . . no acute distress. Respiratory Normal work of breathing on room air.. Notes 05/18/2023: The blister on his left lower leg has resolved. The wound on his right first metatarsal head is smaller today. There is [name] little bit of slough, callus, and eschar present. Moisture balance is good. Electronic Signature(s) Signed: 05/18/2023 9:29:12 AM By: Duanne Guess MD FACS Entered By: Duanne Guess on 05/18/2023 09:29:12 -------------------------------------------------------------------------------- Physician Orders Details Patient Name: Date of Service: [name] Medical Record Number: [id number] Patient Account Number: [id number] Date of Birth/Sex: Treating RN: 1956/04/29 (67 y.o. [name] Primary Care Provider: Arva Chafe Other Clinician: Referring Provider: Treating Provider/Extender: [name] in Treatment: 37 The following information was scribed by: Samuella Bruin The information was scribed for: Duanne Guess Verbal / Phone Orders: No Diagnosis Coding Follow-up Appointments ppointment in 1 week. - Dr. Lady Gary - room 2 TCC Return [name] Anesthetic (In  clinic) Topical  wound is [name] little bit smaller again today. He is managing very well in the peg assist offloading  insert and there has been no tissue breakdown or periwound maceration as [name] result of being out of the total contact cast. 03/16/2023: The wound measured smaller again today. He continues to do well using the peg assist offloading insert. 03/23/2023: Although the wound measured the same size today, it visually appears smaller to me. There is good granulation tissue with minimal slough. There is [name] little callus around the edges. 03/30/2023: The wound is smaller today. The granulation tissue is healthy. There is [name] little bit of callus accumulation around the edges. 04/06/2023: Unfortunately, he did not have any assistance to change his dressing during the week and he was also walking substantially more due to attending [name] family event. This has resulted in fairly significant breakdown of his wound. Much of this seems to be related to moisture accumulation. 04/13/2023: The wound looks markedly better this week after being in the total contact cast. There is no moisture-related tissue breakdown, and the wound opening is smaller. 04/20/2023: The wound is smaller again today. The periwound skin is nice and dry and there is no concern for infection. 04/27/2023: The wound continues to contract. The surface is clean. There is [name] little bit of periwound moisture, but the wound itself has not broken down at all. 05/04/2023: The wound is stable this week. He has had an increase in his left lower leg swelling, however, and he has an intact blister on the distal medial aspect of the leg, adjacent to where his saphenous vein was harvested. 05/11/2023: The wound is [name] little bit narrower from 12-6 o'clock but slightly wider from 9-3 o'clock. It is otherwise fairly clean. The blister on his left lower leg is still present but flat. Edema is improved. 05/18/2023: The blister on his left lower leg has resolved. The wound on his right first metatarsal head is smaller today. There is [name] little bit of slough, callus, and eschar  present. Moisture balance is good. Electronic Signature(s) Signed: 05/18/2023 9:28:39 AM By: Duanne Guess MD FACS Entered By: Duanne Guess on 05/18/2023 09:28:39 [name], [name] ([id number]) [id number] Page 4 of 12 -------------------------------------------------------------------------------- Physical Exam Details Patient Name: Date of Service: [name], [name]. 05/18/2023 9:15 [name] [name] Medical Record Number: [id number] Patient Account Number: [id number] Date of Birth/Sex: Treating RN: 09-Feb-1956 (67 y.o. [name]) Primary Care Provider: Arva Chafe Other Clinician: Referring Provider: Treating Provider/Extender: [name] in Treatment: 37 Constitutional Hypertensive, asymptomatic. . . . no acute distress. Respiratory Normal work of breathing on room air.. Notes 05/18/2023: The blister on his left lower leg has resolved. The wound on his right first metatarsal head is smaller today. There is [name] little bit of slough, callus, and eschar present. Moisture balance is good. Electronic Signature(s) Signed: 05/18/2023 9:29:12 AM By: Duanne Guess MD FACS Entered By: Duanne Guess on 05/18/2023 09:29:12 -------------------------------------------------------------------------------- Physician Orders Details Patient Name: Date of Service: [name] Medical Record Number: [id number] Patient Account Number: [id number] Date of Birth/Sex: Treating RN: 1956/04/29 (67 y.o. [name] Primary Care Provider: Arva Chafe Other Clinician: Referring Provider: Treating Provider/Extender: [name] in Treatment: 37 The following information was scribed by: Samuella Bruin The information was scribed for: Duanne Guess Verbal / Phone Orders: No Diagnosis Coding Follow-up Appointments ppointment in 1 week. - Dr. Lady Gary - room 2 TCC Return [name] Anesthetic (In  clinic) Topical  [name], [name] ([id number]) [id number] Page 1 of 12 Visit Report for 05/18/2023 Chief Complaint Document Details Patient Name: Date of Service: Raliegh, Karpf [name]. 05/18/2023 9:15 [name] [name] Medical Record Number: [id number] Patient Account Number: [id number] Date of Birth/Sex: Treating RN: [birth year] (67 y.o. [name]) Primary Care Provider: Arva Chafe Other Clinician: Referring Provider: Treating Provider/Extender: [name] in Treatment: 37 Information Obtained from: Patient Chief Complaint 05/14/2020; patient is here for review of an abrasion injury on the left lateral calf 08/31/2022: [name] (1st metatarsal base, plantar) Electronic Signature(s) Signed: 05/18/2023 9:28:00 AM By: Duanne Guess MD FACS Entered By: Duanne Guess on 05/18/2023 09:28:00 -------------------------------------------------------------------------------- Debridement Details Patient Name: Date of Service: [name] Medical Record Number: [id number] Patient Account Number: [id number] Date of Birth/Sex: Treating RN: 31-Oct-1955 (67 y.o. [name] Primary Care Provider: Arva Chafe Other Clinician: Referring Provider: Treating Provider/Extender: [name] in Treatment: 37 Debridement Performed for Assessment: Wound #2 Right,Plantar Foot Performed By: Physician Duanne Guess, MD The following information was scribed by: Samuella Bruin The information was scribed for: Duanne Guess Debridement Type: Debridement Severity of Tissue Pre Debridement: Fat layer exposed Level of Consciousness (Pre-procedure): Awake and Alert Pre-procedure Verification/Time Out Yes - 09:23 Taken: Start Time: 09:23 Pain Control: Lidocaine 5% topical ointment Percent of Wound Bed Debrided: 100% T Area Debrided (cm): otal 0.16 Tissue and other material debrided: Non-Viable, Eschar,  Slough, Slough Level: Non-Viable Tissue Debridement Description: Selective/Open Wound Instrument: Curette Bleeding: Minimum Hemostasis Achieved: Pressure Response to Treatment: Procedure was tolerated well Level of Consciousness (Post- Awake and Alert procedure): Post Debridement Measurements of Total Wound Length: (cm) 0.2 Width: (cm) 1 Depth: (cm) 0.1 Volume: (cm) 0.016 [name], [name] ([id number]) [id number] Page 2 of 12 Character of Wound/Ulcer Post Debridement: Improved Severity of Tissue Post Debridement: Fat layer exposed Post Procedure Diagnosis Same as Pre-procedure Electronic Signature(s) Signed: 05/18/2023 9:44:36 AM By: Duanne Guess MD FACS Signed: 05/18/2023 1:45:14 PM By: Samuella Bruin Entered By: Samuella Bruin on 05/18/2023 09:25:09 -------------------------------------------------------------------------------- HPI Details Patient Name: Date of Service: [name] Medical Record Number: [id number] Patient Account Number: [id number] Date of Birth/Sex: Treating RN: July 31, 1955 (67 y.o. [name]) Primary Care Provider: Arva Chafe Other Clinician: Referring Provider: Treating Provider/Extender: [name] in Treatment: 37 History of Present Illness HPI Description: ADMISSION 05/14/2020; this is [name] with multiple medical issues. Predominantly he has type 2 diabetes with [name] and also history of fairly significant PAD. He had [name] in February 2017 he also had an atherectomy and angioplasty by Dr. [name] of the right popliteal artery in 2016. He is supposed to be getting arterial studies annually however this was interrupted last year because of the pandemic. He tells [place] he was at [facility] 2 weeks ago was getting out of of the scooter and traumatized his left lateral lower leg.  There was [name] as the patient is on Plavix and Eliquis. They have been dressing this with Neosporin and doing [name] fairly good job. Wound measures 2.5 x 3.5 it does not have any depth he does not have [name] in his legs outside of surgery however he does have chronic edema and skin changes suggestive of chronic venous disease possibly some degree of lymphedema as well. Past medical history includes type 2 diabetes with peripheral neuropathy and gait  [name], [name] ([id number]) [id number] Page 1 of 12 Visit Report for 05/18/2023 Chief Complaint Document Details Patient Name: Date of Service: Raliegh, Karpf [name]. 05/18/2023 9:15 [name] [name] Medical Record Number: [id number] Patient Account Number: [id number] Date of Birth/Sex: Treating RN: [birth year] (67 y.o. [name]) Primary Care Provider: Arva Chafe Other Clinician: Referring Provider: Treating Provider/Extender: [name] in Treatment: 37 Information Obtained from: Patient Chief Complaint 05/14/2020; patient is here for review of an abrasion injury on the left lateral calf 08/31/2022: [name] (1st metatarsal base, plantar) Electronic Signature(s) Signed: 05/18/2023 9:28:00 AM By: Duanne Guess MD FACS Entered By: Duanne Guess on 05/18/2023 09:28:00 -------------------------------------------------------------------------------- Debridement Details Patient Name: Date of Service: [name] Medical Record Number: [id number] Patient Account Number: [id number] Date of Birth/Sex: Treating RN: 31-Oct-1955 (67 y.o. [name] Primary Care Provider: Arva Chafe Other Clinician: Referring Provider: Treating Provider/Extender: [name] in Treatment: 37 Debridement Performed for Assessment: Wound #2 Right,Plantar Foot Performed By: Physician Duanne Guess, MD The following information was scribed by: Samuella Bruin The information was scribed for: Duanne Guess Debridement Type: Debridement Severity of Tissue Pre Debridement: Fat layer exposed Level of Consciousness (Pre-procedure): Awake and Alert Pre-procedure Verification/Time Out Yes - 09:23 Taken: Start Time: 09:23 Pain Control: Lidocaine 5% topical ointment Percent of Wound Bed Debrided: 100% T Area Debrided (cm): otal 0.16 Tissue and other material debrided: Non-Viable, Eschar,  Slough, Slough Level: Non-Viable Tissue Debridement Description: Selective/Open Wound Instrument: Curette Bleeding: Minimum Hemostasis Achieved: Pressure Response to Treatment: Procedure was tolerated well Level of Consciousness (Post- Awake and Alert procedure): Post Debridement Measurements of Total Wound Length: (cm) 0.2 Width: (cm) 1 Depth: (cm) 0.1 Volume: (cm) 0.016 [name], [name] ([id number]) [id number] Page 2 of 12 Character of Wound/Ulcer Post Debridement: Improved Severity of Tissue Post Debridement: Fat layer exposed Post Procedure Diagnosis Same as Pre-procedure Electronic Signature(s) Signed: 05/18/2023 9:44:36 AM By: Duanne Guess MD FACS Signed: 05/18/2023 1:45:14 PM By: Samuella Bruin Entered By: Samuella Bruin on 05/18/2023 09:25:09 -------------------------------------------------------------------------------- HPI Details Patient Name: Date of Service: [name] Medical Record Number: [id number] Patient Account Number: [id number] Date of Birth/Sex: Treating RN: July 31, 1955 (67 y.o. [name]) Primary Care Provider: Arva Chafe Other Clinician: Referring Provider: Treating Provider/Extender: [name] in Treatment: 37 History of Present Illness HPI Description: ADMISSION 05/14/2020; this is [name] with multiple medical issues. Predominantly he has type 2 diabetes with [name] and also history of fairly significant PAD. He had [name] in February 2017 he also had an atherectomy and angioplasty by Dr. [name] of the right popliteal artery in 2016. He is supposed to be getting arterial studies annually however this was interrupted last year because of the pandemic. He tells [place] he was at [facility] 2 weeks ago was getting out of of the scooter and traumatized his left lateral lower leg.  There was [name] as the patient is on Plavix and Eliquis. They have been dressing this with Neosporin and doing [name] fairly good job. Wound measures 2.5 x 3.5 it does not have any depth he does not have [name] in his legs outside of surgery however he does have chronic edema and skin changes suggestive of chronic venous disease possibly some degree of lymphedema as well. Past medical history includes type 2 diabetes with peripheral neuropathy and gait

## 2023-05-18 NOTE — Progress Notes (Signed)
No Site Locations Rate the pain. Current Pain Level: 0 Pain Management and Medication Current Pain Management: Electronic Signature(s) Signed: 05/18/2023 1:45:14 PM By: Samuella Bruin Entered By: Samuella Bruin on 05/18/2023  09:08:09 -------------------------------------------------------------------------------- Patient/Caregiver Education Details Patient Name: Date of Service: [name] RK A. 10/29/2024andnbsp9:15 A M Medical Record Number: [id number] Patient Account Number: [id number] Date of Birth/Gender: Treating RN: 03-14-1956 (67 y.o. [name] Primary Care Physician: Arva Chafe Other Clinician: Referring Physician: Treating Physician/Extender: [name] in Treatment: 37 Education Assessment Education Provided To: Patient Education Topics Provided Wound/Skin Impairment: Methods: Explain/Verbal Responses: Reinforcements needed, State content correctly Electronic Signature(s) Signed: 05/18/2023 1:45:14 PM By: Gelene Mink By: Samuella Bruin on 05/18/2023 09:24:38 -------------------------------------------------------------------------------- Wound Assessment Details Patient Name: Date of Service: [name], [name] 05/18/2023 9:15 A M Medical Record Number: [id number] Patient Account Number: [id number] Date of Birth/Sex: Treating RN: 1956-01-09 (67 y.o. Lenise Herald, Dorann Lodge, Shykeem A ([id number]) [phone].pdf Page 6 of 7 Primary Care Meriah Shands: Arva Chafe Other Clinician: Referring Dequarius Jeffries: Treating Zeshan Sena/Extender: [name] in Treatment: 37 Wound Status Wound Number: 2 Primary Diabetic Wound/Ulcer of the Lower Extremity Etiology: Wound Location: Right, Plantar Foot Wound Open Wounding Event: Gradually Appeared Status: Date Acquired: 07/08/2022 Comorbid Sleep Apnea, Arrhythmia, Coronary Artery Disease, Deep Vein Weeks Of Treatment: 37 History: Thrombosis, Hypertension, Myocardial Infarction, Peripheral Arterial Clustered Wound: No Disease, Type II Diabetes, Neuropathy Photos Wound Measurements Length: (cm) 0.2 Width: (cm) 1 Depth: (cm) 0.1 Area:  (cm) 0.157 Volume: (cm) 0.016 % Reduction in Area: 93.9% % Reduction in Volume: 93.7% Epithelialization: Medium (34-66%) Tunneling: No Undermining: No Wound Description Classification: Grade 1 Wound Margin: Distinct, outline attached Exudate Amount: Medium Exudate Type: Serosanguineous Exudate Color: red, brown Foul Odor After Cleansing: No Slough/Fibrino Yes Wound Bed Granulation Amount: Large (67-100%) Exposed Structure Granulation Quality: Red Fascia Exposed: No Necrotic Amount: Small (1-33%) Fat Layer (Subcutaneous Tissue) Exposed: Yes Necrotic Quality: Adherent Slough Tendon Exposed: No Muscle Exposed: No Joint Exposed: No Bone Exposed: No Periwound Skin Texture Texture Color No Abnormalities Noted: No No Abnormalities Noted: Yes Callus: Yes Temperature / Pain Temperature: No Abnormality Moisture No Abnormalities Noted: Yes Treatment Notes Wound #2 (Foot) Wound Laterality: Plantar, Right Cleanser Soap and Water Discharge Instruction: May shower and wash wound with dial antibacterial soap and water prior to dressing change. Wound Cleanser Discharge Instruction: Cleanse the wound with wound cleanser prior to applying a clean dressing using gauze sponges, not tissue or cotton balls. Peri-Wound Care Zinc Oxide Ointment 30g tube Discharge Instruction: Apply Zinc Oxide to periwound with each dressing change Topical Gentamicin Ohlinger, Romey A ([id number]) [phone].pdf Page 7 of 7 Discharge Instruction: As directed by physician Mupirocin Ointment Discharge Instruction: Apply Mupirocin (Bactroban) as instructed Primary Dressing Maxorb Extra Ag+ Alginate Dressing, 2x2 (in/in) Discharge Instruction: Apply to wound bed as instructed Secondary Dressing Woven Gauze Sponge, Non-Sterile 4x4 in Discharge Instruction: Apply over primary dressing as directed. Zetuvit Plus 4x8 in Discharge Instruction: Apply over primary dressing as directed. Secured  With [employer], 4.5x3.1 (in/yd) Discharge Instruction: Secure with Kerlix as directed. 80M Medipore H Soft Cloth Surgical T ape, 4 x 10 (in/yd) Discharge Instruction: Secure with tape as directed. Compression Wrap Compression Stockings Add-Ons Electronic Signature(s) Signed: 05/18/2023 1:45:14 PM By: Samuella Bruin Entered By: Samuella Bruin on 05/18/2023 09:20:05 -------------------------------------------------------------------------------- Vitals Details Patient Name: Date of Service: [name], [name] 05/18/2023 9:15 A M Medical Record Number: [id number] Patient Account Number: [id number] Date of Birth/Sex: Treating RN: [birth year] (  Comorbid History: Artery Disease, Deep Vein Thrombosis, Hypertension, Myocardial Infarction, Peripheral Arterial Disease, Type II Diabetes, Neuropathy 07/08/2022 N/A N/A Date Acquired: 47 N/A N/A Weeks of Treatment: Open N/A N/A Wound Status: No N/A N/A Wound Recurrence: 0.2x1x0.1 N/A N/A Measurements L x W x D (cm) 0.157 N/A N/A A (cm) : rea 0.016 N/A N/A Volume (cm) : 93.90% N/A N/A % Reduction in A rea: 93.70% N/A N/A % Reduction in Volume: Grade 1 N/A N/A Classification: Medium N/A N/A Exudate A mount: Serosanguineous N/A N/A Exudate Type: red, brown N/A N/A Exudate Color: Distinct, outline attached N/A N/A Wound Margin: Large (67-100%) N/A N/A Granulation A mount: Red N/A N/A Granulation Quality: Small (1-33%) N/A N/A Necrotic A mount: Fat Layer (Subcutaneous Tissue): Yes N/A N/A Exposed Structures: Fascia: No Tendon: No Muscle: No Joint: No Bone: No Medium (34-66%) N/A N/A Epithelialization: Debridement - Selective/Open Wound N/A N/A Debridement: Pre-procedure  Verification/Time Out 09:23 N/A N/A Taken: Lidocaine 5% topical ointment N/A N/A Pain Control: Necrotic/Eschar, Slough N/A N/A Tissue Debrided: Non-Viable Tissue N/A N/A Level: 0.16 N/A N/A Debridement A (sq cm): rea Curette N/A N/A Instrument: Minimum N/A N/A Bleeding: Pressure N/A N/A Hemostasis A chieved: Procedure was tolerated well N/A N/A Debridement Treatment Response: 0.2x1x0.1 N/A N/A Post Debridement Measurements L x W x D (cm) 0.016 N/A N/A Post Debridement Volume: (cm) Callus: Yes N/A N/A Periwound Skin Texture: Maceration: Yes N/A N/A Periwound Skin Moisture: Dry/Scaly: No No Abnormalities Noted N/A N/A Periwound Skin Color: No Abnormality N/A N/A Temperature: Debridement N/A N/A Procedures Performed: T Contact Cast otal Treatment Notes Electronic Signature(s) Signed: 05/18/2023 9:27:24 AM By: Duanne Guess MD FACS Entered By: Duanne Guess on 05/18/2023 09:27:24 -------------------------------------------------------------------------------- Multi-Disciplinary Care Plan Details Patient Name: Date of Service: [name], [name] 05/18/2023 9:15 A M Medical Record Number: [id number] Patient Account Number: [id number] Date of Birth/Sex: Treating RN: 1956/06/08 (67 y.o. Lenise Herald, Dorann Lodge, Kazim A ([id number]) [phone].pdf Page 4 of 7 Primary Care Keeon Zurn: Arva Chafe Other Clinician: Referring Alphonza Tramell: Treating Ayleen Mckinstry/Extender: [name] in Treatment: 37 Multidisciplinary Care Plan reviewed with physician Active Inactive Abuse / Safety / Falls / Self Care Management Nursing Diagnoses: Impaired physical mobility Potential for falls Goals: Patient will not experience any injury related to falls Date Initiated: 08/31/2022 Target Resolution Date: 06/18/2023 Goal Status: Active Patient/caregiver will verbalize/demonstrate measures taken to improve the patient's personal  safety Date Initiated: 08/31/2022 Target Resolution Date: 06/18/2023 Goal Status: Active Interventions: Provide education on basic hygiene Provide education on fall prevention Notes: Wound/Skin Impairment Nursing Diagnoses: Impaired tissue integrity Knowledge deficit related to ulceration/compromised skin integrity Goals: Patient/caregiver will verbalize understanding of skin care regimen Date Initiated: 08/31/2022 Target Resolution Date: 06/18/2023 Goal Status: Active Interventions: Assess patient/caregiver ability to obtain necessary supplies Assess patient/caregiver ability to perform ulcer/skin care regimen upon admission and as needed Assess ulceration(s) every visit Treatment Activities: Skin care regimen initiated : 08/31/2022 Topical wound management initiated : 08/31/2022 Notes: Electronic Signature(s) Signed: 05/18/2023 1:45:14 PM By: Samuella Bruin Entered By: Samuella Bruin on 05/18/2023 09:24:28 -------------------------------------------------------------------------------- Pain Assessment Details Patient Name: Date of Service: [name], [name] 05/18/2023 9:15 A M Medical Record Number: [id number] Patient Account Number: [id number] Date of Birth/Sex: Treating RN: Jun 06, 1956 (67 y.o. [name] Primary Care Bolivar Koranda: Arva Chafe Other Clinician: Referring Kaysen Sefcik: Treating Terique Kawabata/Extender: [name] in Treatment: 37 Active Problems Location of Pain Severity and Description of Pain Oncale, Govind A ([id number]) [id number].pdf Page 5 of 7 Patient Has Paino  Lawes, Anthany A ([id number]) [id number].pdf Page 1 of 7 Visit Report for 05/18/2023 Arrival Information Details Patient Name: Date of Service: [place], [name] 05/18/2023 9:15 A M Medical Record Number: [id number] Patient Account Number: [id number] Date of Birth/Sex: Treating RN: [birth year] (67 y.o. [name] Primary Care Shereece Wellborn: Arva Chafe Other Clinician: Referring Celina Shiley: Treating Johnjoseph Rolfe/Extender: [name] in Treatment: 37 Visit Information History Since Last Visit Added or deleted any medications: No Patient Arrived: Ambulatory Any new allergies or adverse reactions: No Arrival Time: 09:08 Had a fall or experienced change in No Accompanied By: self activities of daily living that may affect Transfer Assistance: None risk of falls: Patient Identification Verified: Yes Signs or symptoms of abuse/neglect since last visito No Secondary Verification Process Completed: Yes Hospitalized since last visit: No Patient Requires Transmission-Based Precautions: No Implantable device outside of the clinic excluding No Patient Has Alerts: No cellular tissue based products placed in the center since last visit: Has Dressing in Place as Prescribed: Yes Has Compression in Place as Prescribed: Yes Has Footwear/Offloading in Place as Prescribed: Yes Right: T Contact Cast otal Pain Present Now: No Electronic Signature(s) Signed: 05/18/2023 1:45:14 PM By: Samuella Bruin Entered By: Samuella Bruin on 05/18/2023 09:08:28 -------------------------------------------------------------------------------- Encounter Discharge Information Details Patient Name: Date of Service: [name], [name] 05/18/2023 9:15 A M Medical Record Number: [id number] Patient Account Number: [id number] Date of Birth/Sex: Treating RN: 03-07-1956 (67 y.o. [name] Primary Care Jabre Heo: Arva Chafe Other  Clinician: Referring Markala Sitts: Treating Kasyn Stouffer/Extender: [name] in Treatment: 37 Encounter Discharge Information Items Post Procedure Vitals Discharge Condition: Stable Temperature (F): 97.8 Ambulatory Status: Ambulatory Pulse (bpm): 79 Discharge Destination: Home Respiratory Rate (breaths/min): 20 Transportation: Private Auto Blood Pressure (mmHg): 194/70 Accompanied By: self Schedule Follow-up Appointment: Yes Clinical Summary of Care: Patient Declined Electronic Signature(s) Signed: 05/18/2023 1:45:14 PM By: Samuella Bruin Entered By: Samuella Bruin on 05/18/2023 09:43:53 Bearden, Naven A ([id number]) [id number].pdf Page 2 of 7 -------------------------------------------------------------------------------- Lower Extremity Assessment Details Patient Name: Date of Service: [place], [name] 05/18/2023 9:15 A M Medical Record Number: [id number] Patient Account Number: [id number] Date of Birth/Sex: Treating RN: October 27, 1955 (67 y.o. [name] Primary Care Chenoah Mcnally: Arva Chafe Other Clinician: Referring Undra Harriman: Treating Adaijah Endres/Extender: [name] in Treatment: 37 Edema Assessment Assessed: [name]: No] [name]: No] Edema: [Left: Ye] [Right: s] Calf Left: Right: Point of Measurement: From Medial Instep 39.9 cm 37 cm Ankle Left: Right: Point of Measurement: From Medial Instep 25 cm 27.5 cm Vascular Assessment Pulses: Dorsalis Pedis Palpable: [Left:Yes] [Right:Yes] Extremity colors, hair growth, and conditions: Extremity Color: [Left:Red] [Right:Normal] Hair Growth on Extremity: [Left:No] [Right:Yes] Temperature of Extremity: [Left:Warm] [Right:Warm] Capillary Refill: [Left:< 3 seconds] [Right:< 3 seconds] Dependent Rubor: [Left:No No] [Right:No No] Electronic Signature(s) Signed: 05/18/2023 1:45:14 PM By: Samuella Bruin Entered By: Samuella Bruin on  05/18/2023 09:18:09 -------------------------------------------------------------------------------- Multi Wound Chart Details Patient Name: Date of Service: [name], [name] 05/18/2023 9:15 A M Medical Record Number: [id number] Patient Account Number: [id number] Date of Birth/Sex: Treating RN: March 19, 1956 (67 y.o. M) Primary Care Lovie Agresta: Arva Chafe Other Clinician: Referring Vernie Piet: Treating Shaunte Weissinger/Extender: [name] in Treatment: 37 Vital Signs Height(in): 70 Pulse(bpm): 79 Weight(lbs): 260 Blood Pressure(mmHg): 194/70 Body Mass Index(BMI): 37.3 Temperature(F): 97.8 Respiratory Rate(breaths/min): 20 [2:Photos:] [N/A:N/A] Right, Plantar Foot N/A N/A Wound Location: Gradually Appeared N/A N/A Wounding Event: Diabetic Wound/Ulcer of the Lower N/A N/A Primary Etiology: Extremity Sleep Apnea, Arrhythmia, Coronary N/A N/A  No Site Locations Rate the pain. Current Pain Level: 0 Pain Management and Medication Current Pain Management: Electronic Signature(s) Signed: 05/18/2023 1:45:14 PM By: Samuella Bruin Entered By: Samuella Bruin on 05/18/2023  09:08:09 -------------------------------------------------------------------------------- Patient/Caregiver Education Details Patient Name: Date of Service: [name] RK A. 10/29/2024andnbsp9:15 A M Medical Record Number: [id number] Patient Account Number: [id number] Date of Birth/Gender: Treating RN: 03-14-1956 (67 y.o. [name] Primary Care Physician: Arva Chafe Other Clinician: Referring Physician: Treating Physician/Extender: [name] in Treatment: 37 Education Assessment Education Provided To: Patient Education Topics Provided Wound/Skin Impairment: Methods: Explain/Verbal Responses: Reinforcements needed, State content correctly Electronic Signature(s) Signed: 05/18/2023 1:45:14 PM By: Gelene Mink By: Samuella Bruin on 05/18/2023 09:24:38 -------------------------------------------------------------------------------- Wound Assessment Details Patient Name: Date of Service: [name], [name] 05/18/2023 9:15 A M Medical Record Number: [id number] Patient Account Number: [id number] Date of Birth/Sex: Treating RN: 1956-01-09 (67 y.o. Lenise Herald, Dorann Lodge, Shykeem A ([id number]) [phone].pdf Page 6 of 7 Primary Care Meriah Shands: Arva Chafe Other Clinician: Referring Dequarius Jeffries: Treating Zeshan Sena/Extender: [name] in Treatment: 37 Wound Status Wound Number: 2 Primary Diabetic Wound/Ulcer of the Lower Extremity Etiology: Wound Location: Right, Plantar Foot Wound Open Wounding Event: Gradually Appeared Status: Date Acquired: 07/08/2022 Comorbid Sleep Apnea, Arrhythmia, Coronary Artery Disease, Deep Vein Weeks Of Treatment: 37 History: Thrombosis, Hypertension, Myocardial Infarction, Peripheral Arterial Clustered Wound: No Disease, Type II Diabetes, Neuropathy Photos Wound Measurements Length: (cm) 0.2 Width: (cm) 1 Depth: (cm) 0.1 Area:  (cm) 0.157 Volume: (cm) 0.016 % Reduction in Area: 93.9% % Reduction in Volume: 93.7% Epithelialization: Medium (34-66%) Tunneling: No Undermining: No Wound Description Classification: Grade 1 Wound Margin: Distinct, outline attached Exudate Amount: Medium Exudate Type: Serosanguineous Exudate Color: red, brown Foul Odor After Cleansing: No Slough/Fibrino Yes Wound Bed Granulation Amount: Large (67-100%) Exposed Structure Granulation Quality: Red Fascia Exposed: No Necrotic Amount: Small (1-33%) Fat Layer (Subcutaneous Tissue) Exposed: Yes Necrotic Quality: Adherent Slough Tendon Exposed: No Muscle Exposed: No Joint Exposed: No Bone Exposed: No Periwound Skin Texture Texture Color No Abnormalities Noted: No No Abnormalities Noted: Yes Callus: Yes Temperature / Pain Temperature: No Abnormality Moisture No Abnormalities Noted: Yes Treatment Notes Wound #2 (Foot) Wound Laterality: Plantar, Right Cleanser Soap and Water Discharge Instruction: May shower and wash wound with dial antibacterial soap and water prior to dressing change. Wound Cleanser Discharge Instruction: Cleanse the wound with wound cleanser prior to applying a clean dressing using gauze sponges, not tissue or cotton balls. Peri-Wound Care Zinc Oxide Ointment 30g tube Discharge Instruction: Apply Zinc Oxide to periwound with each dressing change Topical Gentamicin Ohlinger, Romey A ([id number]) [phone].pdf Page 7 of 7 Discharge Instruction: As directed by physician Mupirocin Ointment Discharge Instruction: Apply Mupirocin (Bactroban) as instructed Primary Dressing Maxorb Extra Ag+ Alginate Dressing, 2x2 (in/in) Discharge Instruction: Apply to wound bed as instructed Secondary Dressing Woven Gauze Sponge, Non-Sterile 4x4 in Discharge Instruction: Apply over primary dressing as directed. Zetuvit Plus 4x8 in Discharge Instruction: Apply over primary dressing as directed. Secured  With [employer], 4.5x3.1 (in/yd) Discharge Instruction: Secure with Kerlix as directed. 80M Medipore H Soft Cloth Surgical T ape, 4 x 10 (in/yd) Discharge Instruction: Secure with tape as directed. Compression Wrap Compression Stockings Add-Ons Electronic Signature(s) Signed: 05/18/2023 1:45:14 PM By: Samuella Bruin Entered By: Samuella Bruin on 05/18/2023 09:20:05 -------------------------------------------------------------------------------- Vitals Details Patient Name: Date of Service: [name], [name] 05/18/2023 9:15 A M Medical Record Number: [id number] Patient Account Number: [id number] Date of Birth/Sex: Treating RN: [birth year] (

## 2023-05-20 DIAGNOSIS — I2510 Atherosclerotic heart disease of native coronary artery without angina pectoris: Secondary | ICD-10-CM | POA: Diagnosis not present

## 2023-05-20 DIAGNOSIS — E1165 Type 2 diabetes mellitus with hyperglycemia: Secondary | ICD-10-CM | POA: Diagnosis not present

## 2023-05-20 DIAGNOSIS — I10 Essential (primary) hypertension: Secondary | ICD-10-CM | POA: Diagnosis not present

## 2023-05-20 DIAGNOSIS — N1831 Chronic kidney disease, stage 3a: Secondary | ICD-10-CM | POA: Diagnosis not present

## 2023-05-20 DIAGNOSIS — L97519 Non-pressure chronic ulcer of other part of right foot with unspecified severity: Secondary | ICD-10-CM | POA: Diagnosis not present

## 2023-05-20 DIAGNOSIS — E7800 Pure hypercholesterolemia, unspecified: Secondary | ICD-10-CM | POA: Diagnosis not present

## 2023-05-25 ENCOUNTER — Encounter (HOSPITAL_BASED_OUTPATIENT_CLINIC_OR_DEPARTMENT_OTHER): Payer: Medicare HMO | Attending: General Surgery | Admitting: General Surgery

## 2023-05-25 DIAGNOSIS — L97512 Non-pressure chronic ulcer of other part of right foot with fat layer exposed: Secondary | ICD-10-CM | POA: Insufficient documentation

## 2023-05-25 DIAGNOSIS — I2510 Atherosclerotic heart disease of native coronary artery without angina pectoris: Secondary | ICD-10-CM | POA: Diagnosis not present

## 2023-05-25 DIAGNOSIS — I872 Venous insufficiency (chronic) (peripheral): Secondary | ICD-10-CM | POA: Diagnosis not present

## 2023-05-25 DIAGNOSIS — Z951 Presence of aortocoronary bypass graft: Secondary | ICD-10-CM | POA: Diagnosis not present

## 2023-05-25 DIAGNOSIS — Z7902 Long term (current) use of antithrombotics/antiplatelets: Secondary | ICD-10-CM | POA: Diagnosis not present

## 2023-05-25 DIAGNOSIS — E11621 Type 2 diabetes mellitus with foot ulcer: Secondary | ICD-10-CM | POA: Diagnosis not present

## 2023-05-25 DIAGNOSIS — I110 Hypertensive heart disease with heart failure: Secondary | ICD-10-CM | POA: Diagnosis not present

## 2023-05-25 DIAGNOSIS — E1142 Type 2 diabetes mellitus with diabetic polyneuropathy: Secondary | ICD-10-CM | POA: Diagnosis not present

## 2023-05-25 DIAGNOSIS — Z7901 Long term (current) use of anticoagulants: Secondary | ICD-10-CM | POA: Insufficient documentation

## 2023-05-25 DIAGNOSIS — I4891 Unspecified atrial fibrillation: Secondary | ICD-10-CM | POA: Insufficient documentation

## 2023-05-25 DIAGNOSIS — I252 Old myocardial infarction: Secondary | ICD-10-CM | POA: Insufficient documentation

## 2023-05-25 DIAGNOSIS — G4733 Obstructive sleep apnea (adult) (pediatric): Secondary | ICD-10-CM | POA: Insufficient documentation

## 2023-05-25 DIAGNOSIS — I5032 Chronic diastolic (congestive) heart failure: Secondary | ICD-10-CM | POA: Insufficient documentation

## 2023-05-25 DIAGNOSIS — E1151 Type 2 diabetes mellitus with diabetic peripheral angiopathy without gangrene: Secondary | ICD-10-CM | POA: Diagnosis not present

## 2023-05-25 NOTE — Progress Notes (Signed)
[name], [name] [id number] [id number] Page 1 of 7 Visit Report for 05/25/2023 Arrival Information Details Patient Name: Date of Service: [name], [name]. 05/25/2023 9:15 [name] Medical Record Number: [id number] Patient Account Number: [id number] Date of Birth/Sex: Treating RN: 16-Oct-1955 (67 y.o. [name] Primary Care Sevannah Madia: [name] Other Clinician: Referring [name]: Treating [name]/Extender: [name] in Treatment: 38 Visit Information History Since Last Visit Added or deleted any medications: No Patient Arrived: Ambulatory Any new allergies or adverse reactions: No Arrival Time: 09:02 Had [name] fall or experienced change in No Accompanied By: self activities of daily living that may affect Transfer Assistance: None risk of falls: Patient Identification Verified: Yes Signs or symptoms of abuse/neglect since last visito No Secondary Verification Process Completed: Yes Hospitalized since last visit: No Patient Requires Transmission-Based Precautions: No Implantable device outside of the clinic excluding No Patient Has Alerts: No cellular tissue based products placed in the center since last visit: Has Dressing in Place as Prescribed: Yes Has Footwear/Offloading in Place as Prescribed: Yes Right: T Contact Cast otal Pain Present Now: No Electronic Signature(s) Signed: 05/25/2023 3:30:49 PM By: [name] Entered By: [name] on 05/25/2023 06:02:44 -------------------------------------------------------------------------------- Encounter Discharge Information Details Patient Name: Date of Service: [name], [name]. 05/25/2023 9:15 [name] Medical Record Number: [id number] Patient Account Number: [id number] Date of Birth/Sex: Treating RN: 1955-09-05 (67 y.o. [name] Primary Care [name]: [name] Other Clinician: Referring [name]: Treating [name]/Extender: [name] in Treatment: 38 Encounter Discharge Information Items Post Procedure Vitals Discharge Condition: Stable Temperature (F): 98.9 Ambulatory Status: Ambulatory Pulse (bpm): 61 Discharge Destination: Home Respiratory Rate (breaths/min): 16 Transportation: Private Auto Blood Pressure (mmHg): 191/81 Accompanied By: self Schedule Follow-up Appointment: Yes Clinical Summary of Care: Patient Declined Electronic Signature(s) Signed: 05/25/2023 3:30:49 PM By: [name] Entered By: [name] on 05/25/2023 06:43:06 [name], [name] ([id number]) [id number] Page 2 of 7 -------------------------------------------------------------------------------- Lower Extremity Assessment Details Patient Name: Date of Service: [name], [name]. 05/25/2023 9:15 [name] Medical Record Number: [id number] Patient Account Number: [id number] Date of Birth/Sex: Treating RN: [birth year] (67 y.o. [name] Primary Care [name]: [name] Other Clinician: Referring [name]: Treating [name]/Extender: [name] in Treatment: 38 Edema Assessment Assessed: [name]: No] [name]: No] Edema: [Left: Ye] [Right: s] Calf Left: Right: Point of Measurement: From Medial Instep 39.9 cm 37.7 cm Ankle Left: Right: Point of Measurement: From Medial Instep 25 cm 28 cm Vascular Assessment Pulses: Dorsalis Pedis Palpable: [Right:Yes] Extremity colors, hair growth, and conditions: Extremity Color: [Left:Red] [Right:Normal] Hair Growth on Extremity: [Left:No] [Right:Yes] Temperature of Extremity: [Left:Warm] [Right:Warm] Capillary Refill: [Left:< 3 seconds] [Right:< 3 seconds] Dependent Rubor: [Left:No No] [Right:No No] Electronic Signature(s) Signed: 05/25/2023 3:30:49 PM By: [name] Entered By: [name] on 05/25/2023  06:12:02 -------------------------------------------------------------------------------- Multi Wound Chart Details Patient Name: Date of Service: [name], [name]. 05/25/2023 9:15 [name] Medical Record Number: [id number] Patient Account Number: [id number] Date of Birth/Sex: Treating RN: [birth year] (67 y.o. 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 [name] Primary Etiology: Extremity Sleep Apnea, Arrhythmia, Coronary [name] [name] Comorbid History: Artery Disease, Deep Vein Thrombosis, Hypertension,  Myocardial Infarction, Peripheral Arterial Disease, Type II Diabetes, Neuropathy 07/08/2022 [name] [name] Date Acquired: 53 [name] [name] Weeks of Treatment: Open [name] [name] Wound Status: No [name] [name] Wound Recurrence: 0.3x0.3x0.1 [name] [name] Measurements L x W x D (cm) 0.071 [name] [name] [name] : rea 0.007 [name] [name] Volume (cm) : 97.20% [name] [name] % Reduction in [name] rea: 97.30% [name] [name] % Reduction in Volume: Grade 1 [name] [name] Classification: Medium [name] [name] Exudate [name] mount: Serosanguineous [name] [name] Exudate Type: red, brown [name] [name] Exudate Color: Distinct, outline attached [name] [name] Wound Margin: Small (1-33%) [name] [name] Granulation [name] mount: Red [name] [name] Granulation Quality: Large (67-100%) [name] [name] Necrotic [name] mount: Eschar [name] [name] Necrotic Tissue: Fat Layer (Subcutaneous Tissue): Yes [name] [name] Exposed Structures: Fascia: No Tendon: No Muscle: No Joint: No Bone: No Medium (34-66%) [name] [name] Epithelialization: Debridement - Excisional [name] [name] Debridement: Pre-procedure  Verification/Time Out 09:16 [name] [name] Taken: Lidocaine 5% topical ointment [name] [name] Pain Control: Callus, Subcutaneous, Slough [name] [name] Tissue Debrided: Skin/Subcutaneous Tissue [name] [name] Level: 0.07 [name] [name] Debridement [name] (sq cm): rea Curette [name] [name] Instrument: Minimum [name] [name] Bleeding: Pressure [name] [name] Hemostasis [name] chieved: Procedure was tolerated well [name] [name] Debridement Treatment Response: 0.3x0.3x0.1 [name] [name] Post Debridement Measurements L x W x D (cm) 0.007 [name] [name] Post Debridement Volume: (cm) Callus: Yes [name] [name] Periwound Skin Texture: Maceration: Yes [name] [name] Periwound Skin Moisture: Dry/Scaly: No No Abnormalities Noted [name] [name] Periwound Skin Color: No Abnormality [name] [name] Temperature: Debridement [name] [name] Procedures Performed: T Contact Cast otal Treatment Notes Electronic Signature(s) Signed: 05/25/2023 9:21:28 AM By: Duanne Guess MD FACS Entered By: Duanne Guess on 05/25/2023 06:21:28 -------------------------------------------------------------------------------- Multi-Disciplinary Care Plan Details Patient Name: Date of Service: [name], [name]. 05/25/2023 9:15 [name] Medical Record Number: [id number] Patient Account Number: [id number] Date of Birth/Sex: Treating RN: 1956-05-29 (67 y.o. [name] Primary Care Perfecto Purdy: [name] Other Clinician: [name] ([id number]) [id number] Page 4 of 7 Referring Falon Huesca: Treating Shruti Arrey/Extender: [name] in Treatment: 38 Multidisciplinary Care Plan reviewed with physician Active Inactive Abuse / Safety / Falls / Self Care Management Nursing Diagnoses: Impaired physical mobility Potential for falls Goals: Patient will not experience any injury related to falls Date Initiated: 08/31/2022 Target Resolution Date: 06/18/2023 Goal Status: Active Patient/caregiver will verbalize/demonstrate measures taken to improve the patient's  personal safety Date Initiated: 08/31/2022 Target Resolution Date: 06/18/2023 Goal Status: Active Interventions: Provide education on basic hygiene Provide education on fall prevention Notes: Wound/Skin Impairment Nursing Diagnoses: Impaired tissue integrity Knowledge deficit related to ulceration/compromised skin integrity Goals: Patient/caregiver will verbalize understanding of skin care regimen Date Initiated: 08/31/2022 Target Resolution Date: 06/18/2023 Goal Status: Active Interventions: Assess patient/caregiver ability to obtain necessary supplies Assess patient/caregiver ability to perform ulcer/skin care regimen upon admission and as needed Assess ulceration(s) every visit Treatment Activities: Skin care regimen initiated : 08/31/2022 Topical wound management initiated : 08/31/2022 Notes: Electronic Signature(s) Signed: 05/25/2023 3:30:49 PM By: [name] Entered By: [name] on 05/25/2023 06:17:56 -------------------------------------------------------------------------------- Pain Assessment Details Patient Name: Date of Service: [name], [name]. 05/25/2023 9:15 [name] Medical Record Number: [id number] Patient Account Number: [id number] Date of Birth/Sex: Treating RN: 12-May-1956 (67 y.o. [name] Primary Care Rayne Cowdrey: [name] Other Clinician: Referring Senon Nixon: Treating Charleigh Correnti/Extender: [name] in Treatment: 38 Active Problems Location of Pain Severity and Description of Pain Patient Has Paino No [name], [name] ([id number]) [id number] Page 5 of 7 Patient Has  Paino No Site Locations Rate the pain. Current Pain Level: 0 Pain Management and Medication Current Pain Management: Electronic Signature(s) Signed: 05/25/2023 3:30:49 PM By: [name] Entered By: [name] on 05/25/2023  06:03:04 -------------------------------------------------------------------------------- Patient/Caregiver Education Details Patient Name: Date of Service: [name] [name]. 11/5/2024andnbsp9:15 [name] Medical Record Number: [id number] Patient Account Number: [id number] Date of Birth/Gender: Treating RN: 03/04/1956 (67 y.o. [name] Primary Care Physician: [name] Other Clinician: Referring Physician: Treating Physician/Extender: [name] in Treatment: 50 Education Assessment Education Provided To: Patient Education Topics Provided Wound/Skin Impairment: Methods: Explain/Verbal Responses: Reinforcements needed, State content correctly Electronic Signature(s) Signed: 05/25/2023 3:30:49 PM By: [name] Entered By: [name] on 05/25/2023 06:18:05 -------------------------------------------------------------------------------- Wound Assessment Details Patient Name: Date of Service: [name], [name]. 05/25/2023 9:15 [name] Medical Record Number: [id number] Patient Account Number: [id number] Date of Birth/Sex: Treating RN: 1955-10-27 (67 y.o. [name], [name], [name] ([id number]) [id number] Page 6 of 7 Primary Care Taijuan Serviss: [name] Other Clinician: Referring Burnell Hurta: Treating Taegen Lennox/Extender: [name] in Treatment: 38 Wound Status Wound Number: 2 Primary Diabetic Wound/Ulcer of the Lower Extremity Etiology: Wound Location: Right, Plantar Foot Wound Open Wounding Event: Gradually Appeared Status: Date Acquired: 07/08/2022 Comorbid Sleep Apnea, Arrhythmia, Coronary Artery Disease, Deep Vein Weeks Of Treatment: 38 History: Thrombosis, Hypertension, Myocardial Infarction, Peripheral Arterial Clustered Wound: No Disease, Type II Diabetes, Neuropathy Photos Wound Measurements Length: (cm) 0.3 Width: (cm) 0.3 Depth: (cm) 0.1 Area: (cm)  0.071 Volume: (cm) 0.007 % Reduction in Area: 97.2% % Reduction in Volume: 97.3% Epithelialization: Medium (34-66%) Tunneling: No Undermining: No Wound Description Classification: Grade 1 Wound Margin: Distinct, outline attached Exudate Amount: Medium Exudate Type: Serosanguineous Exudate Color: red, brown Foul Odor After Cleansing: No Slough/Fibrino Yes Wound Bed Granulation Amount: Small (1-33%) Exposed Structure Granulation Quality: Red Fascia Exposed: No Necrotic Amount: Large (67-100%) Fat Layer (Subcutaneous Tissue) Exposed: Yes Necrotic Quality: Eschar Tendon Exposed: No Muscle Exposed: No Joint Exposed: No Bone Exposed: No Periwound Skin Texture Texture Color No Abnormalities Noted: No No Abnormalities Noted: Yes Callus: Yes Temperature / Pain Temperature: No Abnormality Moisture No Abnormalities Noted: Yes Treatment Notes Wound #2 (Foot) Wound Laterality: Plantar, Right Cleanser Soap and Water Discharge Instruction: May shower and wash wound with dial antibacterial soap and water prior to dressing change. Wound Cleanser Discharge Instruction: Cleanse the wound with wound cleanser prior to applying [name] clean dressing using gauze sponges, not tissue or cotton balls. Peri-Wound Care Zinc Oxide Ointment 30g tube Discharge Instruction: Apply Zinc Oxide to periwound with each dressing change Topical Gentamicin Carawan, [name] ([id number]) [id number] Page 7 of 7 Discharge Instruction: As directed by physician Mupirocin Ointment Discharge Instruction: Apply Mupirocin (Bactroban) as instructed Primary Dressing Maxorb Extra Ag+ Alginate Dressing, 2x2 (in/in) Discharge Instruction: Apply to wound bed as instructed Secondary Dressing Woven Gauze Sponge, Non-Sterile 4x4 in Discharge Instruction: Apply over primary dressing as directed. Zetuvit Plus 4x8 in Discharge Instruction: Apply over primary dressing as directed. Secured With [employer], 4.5x3.1 (in/yd) Discharge Instruction: Secure with Kerlix as directed. 68M Medipore H Soft Cloth Surgical T ape, 4 x 10 (in/yd) Discharge Instruction: Secure with tape as directed. Compression Wrap Compression Stockings Add-Ons Electronic Signature(s) Signed: 05/25/2023 3:30:49 PM By: [name] Entered By: [name] on 05/25/2023 06:14:13 -------------------------------------------------------------------------------- Vitals Details Patient Name: Date of Service: [name], [name]. 05/25/2023 9:15 [name] Medical Record Number: [id number] Patient Account Number: [id number] Date of Birth/Sex: Treating RN: [birth year] (  67 y.o. [name] Primary Care Rupa Lagan: [name] Other Clinician: Referring Aunya Lemler: Treating Mirabella Hilario/Extender: [name] in Treatment: 38 Vital Signs Time Taken: 09:02 Temperature (F): 98.9 Height (in): 70 Pulse (bpm): 61 Weight (lbs): 260 Respiratory Rate (breaths/min): 16 Body Mass Index (BMI): 37.3 Blood Pressure (mmHg): 191/81 Reference Range: 80 - 120 mg / dl Electronic Signature(s) Signed: 05/25/2023 3:30:49 PM By: [name] Entered By: [name] on 05/25/2023 06:02:59

## 2023-05-25 NOTE — Progress Notes (Signed)
[name], [name] [phone]) [id number] Page 1 of 12 Visit Report for 05/25/2023 Chief Complaint Document Details Patient Name: Date of Service: [place], [name] 05/25/2023 9:15 A M Medical Record Number: [id number] Patient Account Number: [id number] Date of Birth/Sex: Treating RN: 04-18-1956 (67 y.o. M) Primary Care Provider: Arva Chafe Other Clinician: Referring Provider: Treating Provider/Extender: [name] in Treatment: 38 Information Obtained from: Patient Chief Complaint 05/14/2020; patient is here for review of an abrasion injury on the left lateral calf 08/31/2022: [name] (1st metatarsal base, plantar) Electronic Signature(s) Signed: 05/25/2023 9:21:37 AM By: Duanne Guess MD FACS Entered By: Duanne Guess on 05/25/2023 06:21:37 -------------------------------------------------------------------------------- Debridement Details Patient Name: Date of Service: [name], [name] 05/25/2023 9:15 A M Medical Record Number: [id number] Patient Account Number: [id number] Date of Birth/Sex: Treating RN: 1955-11-03 (67 y.o. [name] Primary Care Provider: Arva Chafe Other Clinician: Referring Provider: Treating Provider/Extender: [name] in Treatment: 38 Debridement Performed for Assessment: Wound #2 Right,Plantar Foot Performed By: Physician Duanne Guess, MD The following information was scribed by: Samuella Bruin The information was scribed for: Duanne Guess Debridement Type: Debridement Severity of Tissue Pre Debridement: Fat layer exposed Level of Consciousness (Pre-procedure): Awake and Alert Pre-procedure Verification/Time Out Yes - 09:16 Taken: Start Time: 09:16 Pain Control: Lidocaine 5% topical ointment Percent of Wound Bed Debrided: 100% T Area Debrided (cm): otal 0.07 Tissue and other material debrided: Non-Viable, Callus, Slough,  Subcutaneous, Slough Level: Skin/Subcutaneous Tissue Debridement Description: Excisional Instrument: Curette Bleeding: Minimum Hemostasis Achieved: Pressure Response to Treatment: Procedure was tolerated well Level of Consciousness (Post- Awake and Alert procedure): Post Debridement Measurements of Total Wound Length: (cm) 0.3 Width: (cm) 0.3 Depth: (cm) 0.1 Volume: (cm) 0.007 [name], [name] ([id number]) [id number] Page 2 of 12 Character of Wound/Ulcer Post Debridement: Improved Severity of Tissue Post Debridement: Fat layer exposed Post Procedure Diagnosis Same as Pre-procedure Electronic Signature(s) Signed: 05/25/2023 10:39:53 AM By: Duanne Guess MD FACS Signed: 05/25/2023 3:30:49 PM By: Samuella Bruin Entered By: Samuella Bruin on 05/25/2023 06:19:12 -------------------------------------------------------------------------------- HPI Details Patient Name: Date of Service: [name], [name] 05/25/2023 9:15 A M Medical Record Number: [id number] Patient Account Number: [id number] Date of Birth/Sex: Treating RN: [birth year] (67 y.o. M) Primary Care Provider: Arva Chafe Other Clinician: Referring Provider: Treating Provider/Extender: [name] in Treatment: 38 History of Present Illness HPI Description: ADMISSION 05/14/2020; this is a 67 year old man with multiple medical issues. Predominantly he has type 2 diabetes with a history of peripheral neuropathy and also history of fairly significant PAD. He had a left superficial femoral to posterior tibial artery bypass in February 2017 he also had an atherectomy and angioplasty by Dr. [name] of the right popliteal artery in 2016. He is supposed to be getting arterial studies annually however this was interrupted last year because of the pandemic. He tells [place] he was at [facility] 2 weeks ago was getting out of of the scooter and traumatized his left lateral lower leg.  There was a lot of bleeding as the patient is on Plavix and Eliquis. They have been dressing this with Neosporin and doing a fairly good job. Wound measures 2.5 x 3.5 it does not have any depth he does not have a wound history in his legs outside of surgery however he does have chronic edema and skin changes suggestive of chronic venous disease possibly some degree of lymphedema as well. Past medical history includes type 2 diabetes with peripheral neuropathy and gait  instability, lumbar spondylosis, obstructive sleep apnea, history of a left pontine CVA, basal cell skin cancer, atrial fibrillation on anticoagulation, significant PAD as noted with a left superficial femoral to posterior tibial bypass in February 2017 and a right popliteal atherectomy and angioplasty by Dr. [name] in 30th 2016. He also has a history of coronary artery disease with an MI in 2002 hypertension hyperlipidemia and heart failure with preserved ejection fraction His last arterial studies I can see in epic were on 03/10/2018 this showed a right ABI of 0.69 and a right TBI of 0.5 with monophasic waveforms on the right. On the left his ABI was 1.20 with a TBI of 0.92 and triphasic waveforms. He has not had arterial studies since. Our nurse in the clinic got an ABI on the left of 1.1 11/2; left anterior leg wound in the setting of chronic venous insufficiency. Wound was initially trauma. We have been using Hydrofera Blue under compression he has home health.. The wound looks a lot better today with improvement in surface area 11/16; left anterior leg wound in the setting of chronic venous insufficiency. Wound was initially trauma. We have been using Hydrofera Blue under compression. The patient is closed today. He is supposed to follow-up with vein and vascular with regards to arterial insufficiency nevertheless his leg wounds are 12/3; apparently 2 weeks ago when they were putting on their stockings they managed to get 3 wounds on  the left anterior lower leg from abrasion when putting on the stockings. Home health came by the week of Thanksgiving and put Hydrofera Blue 4-layer wrap on this and there is only one superficial area remaining. The patient and his wife complained about the difficulties getting stockings on I think we are using 20/30. We will order bilateral external compression stockings which should be easier. 12/10; wound on the left anterior lower leg is closed. He has chronic venous insufficiency we ordered him Farrow wrap stockings unfortunately he did not bring these in. 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 He says that it has been present since mid December. He is currently residing in [facility] until March and a podiatrist has been looking after him there. They have been simply painting the area with Betadine. He has not had any lower extremity arterial studies since 2019, at which time his right ABI was 0.69. Measured in clinic today, it was 0.71. He is not aware of his most recent hemoglobin A1c, but historically he has had exceptionally poor control. On the basis of his right first metatarsal, there is a small crescent shaped wound. There is surrounding eschar and callus. There is no malodor or purulent drainage. 09/08/2022: The original wound is smaller today and fairly clean, but there is some discoloration and a pulpy texture to the adjacent callus. Underneath this, the tissue is open exposing the fat layer. It looks like perhaps there was a crack in the callus and moisture got under the skin and caused breakdown. 09/15/2022: There has been more moisture related tissue breakdown. The callus is very soft and the underlying tissue is more open. 09/28/2022: The wound looks much better. He has done a good job keeping it dry. There is some callus overlying much of the wound surface. There is slough on the exposed open  areas. 10/14/2022: There has been a lot of moisture related tissue breakdown. He is still forming callus over the top and then it seems that moisture  gets underneath the callus and causes tissue damage. There is slough on the exposed wound surface. [place]. Naim, Ko A [id number]) [id number].pdf Page 3 of 12 10/21/2022: His foot is less wet, but there is no significant change to his wound. He has developed a blister on his left anterior tibial surface. There is no open wound here, but he does have some fairly significant edema. We are planning to apply a total contact cast today. 10/23/2022: Here for his obligatory first cast change. He says he has not had any issues wearing the cast or walking in the boot. No detrimental effects on his wound. 10/29/2022: The wound is looking better. Clearly, putting him in the cast has prevented water from getting in under his callus and causing further tissue breakdown. The blister on his left anterior tibial surface has not yet ruptured. Edema control is significantly improved. 11/05/2022: The wound on his right plantar foot is getting better. There is still some callus accumulation around the wounds, but there are just 2 open areas now, a very small 1 on the medial aspect of his metatarsal head and a little bit larger 1 on the plantar surface. The blister on his left anterior tibial surface is now open with hanging dry skin. 11/12/2022: The left anterior tibial wound is closed. The wound on his right plantar foot is smaller with just a little bit of callus around the edges. 11/19/2022: The right plantar foot wound continues to contract. The surface is quite clean. The tiny satellite area on the medial aspect of his foot has closed. 11/26/2022: The wound is smaller again today. He is responding well to the offloading effects of total contact casting. 12/03/2022: The wound is about the same  size today. There is a little bit of moisture around the perimeter of the wound. No significant tissue breakdown. 12/10/2022: The wound is smaller today. There has been no moisture-related tissue breakdown of any maceration. 12/21/2022: The wound continues to contract. Moisture control is excellent. 12/28/2022: The wound is a little bit smaller today. 01/05/2023: The wound measures smaller today. Excellent moisture control with zero evidence of maceration. The wound surface is clean with healthy granulation tissue. 01/13/2023: We are leaving his Apligraf in situ for a second week. There is no discolored drainage or malodor coming from the site. 01/19/2023: The wound is smaller today. The periwound skin is in good condition without tissue maceration and the healed areas of tissue appear to have improved integrity. 01/27/2023: Continued contraction of the wound. No tissue maceration. The wound surface is flush with the surrounding skin. 02/02/2023: The wound measured a little bit smaller today. There is some bruising just adjacent to the wound, but this has not resulted in any tissue breakdown. 02/09/2023: The wound is smaller again today. The area of bruising that was seen last week has resolved and there has been no tissue damage as a result. There is some callus accumulation around the wound edges along with minimal soft slough on the surface. 02/16/2023: The wound measured a little bit larger today. There is a little bit of maceration around the wound edges, which is likely responsible. 02/23/2023: The wound is smaller today and there is epithelium encroaching around the edges. There is no periwound tissue maceration, nor has he accumulated any significant callus. 03/03/2023: His wound continues to contract. There is no maceration nor any significant callus. There is slight slough on the wound surface. 03/09/2023: The  wound is a little bit smaller again today. He is managing very well in the peg assist offloading  insert and there has been no tissue breakdown or periwound maceration as a result of being out of the total contact cast. 03/16/2023: The wound measured smaller again today. He continues to do well using the peg assist offloading insert. 03/23/2023: Although the wound measured the same size today, it visually appears smaller to me. There is good granulation tissue with minimal slough. There is a little callus around the edges. 03/30/2023: The wound is smaller today. The granulation tissue is healthy. There is a little bit of callus accumulation around the edges. 04/06/2023: Unfortunately, he did not have any assistance to change his dressing during the week and he was also walking substantially more due to attending a family event. This has resulted in fairly significant breakdown of his wound. Much of this seems to be related to moisture accumulation. 04/13/2023: The wound looks markedly better this week after being in the total contact cast. There is no moisture-related tissue breakdown, and the wound opening is smaller. 04/20/2023: The wound is smaller again today. The periwound skin is nice and dry and there is no concern for infection. 04/27/2023: The wound continues to contract. The surface is clean. There is a little bit of periwound moisture, but the wound itself has not broken down at all. 05/04/2023: The wound is stable this week. He has had an increase in his left lower leg swelling, however, and he has an intact blister on the distal medial aspect of the leg, adjacent to where his saphenous vein was harvested. 05/11/2023: The wound is a little bit narrower from 12-6 o'clock but slightly wider from 9-3 o'clock. It is otherwise fairly clean. The blister on his left lower leg is still present but flat. Edema is improved. 05/18/2023: The blister on his left lower leg has resolved. The wound on his right first metatarsal head is smaller today. There is a little bit of slough, callus, and eschar  present. Moisture balance is good. 05/25/2023: His first metatarsal head wound is down to just a narrow slit. No concern for infection. Electronic Signature(s) Signed: 05/25/2023 9:22:16 AM By: Duanne Guess MD FACS Entered By: Duanne Guess on 05/25/2023 06:22:16 [name], [name] ([id number]) [id number] Page 4 of 12 -------------------------------------------------------------------------------- Physical Exam Details Patient Name: Date of Service: [place], [name] 05/25/2023 9:15 A M Medical Record Number: [id number] Patient Account Number: [id number] Date of Birth/Sex: Treating RN: 11-11-1955 (67 y.o. M) Primary Care Provider: Arva Chafe Other Clinician: Referring Provider: Treating Provider/Extender: [name] in Treatment: 38 Constitutional Hypertensive, asymptomatic. . . . no acute distress. Respiratory Normal work of breathing on room air.. Notes 05/25/2023: His first metatarsal head wound is down to just a narrow slit. No concern for infection. Electronic Signature(s) Signed: 05/25/2023 9:24:54 AM By: Duanne Guess MD FACS Entered By: Duanne Guess on 05/25/2023 06:24:54 -------------------------------------------------------------------------------- Physician Orders Details Patient Name: Date of Service: [name], [name] 05/25/2023 9:15 A M Medical Record Number: [id number] Patient Account Number: [id number] Date of Birth/Sex: Treating RN: 1956/05/16 (67 y.o. [name] Primary Care Provider: Arva Chafe Other Clinician: Referring Provider: Treating Provider/Extender: [name] in Treatment: 38 The following information was scribed by: Samuella Bruin The information was scribed for: Duanne Guess Verbal / Phone Orders: No Diagnosis Coding Follow-up Appointments ppointment in 1 week. - Dr. Lady Gary - room 2 TCC Return A Anesthetic (In clinic)  Topical Lidocaine 5%  applied to wound bed Bathing/ Shower/ Hygiene May shower with protection but do not get wound dressing(s) wet. Protect dressing(s) with water repellant cover (for example, large plastic bag) or a cast cover and may then take shower. Off-Loading Total Contact Cast to Right Lower Extremity Removable cast walker boot to: - right leg Non Wound Condition Left Lower Extremity Other Non Wound Condition Orders/Instructions: - compression wrap urgo lite to left leg Wound Treatment Wound #2 - Foot Wound Laterality: Plantar, Right Cleanser: Soap and Water 1 x Per Week/30 Days Discharge Instructions: May shower and wash wound with dial antibacterial soap and water prior to dressing change. Cleanser: Wound Cleanser 1 x Per Week/30 Days Discharge Instructions: Cleanse the wound with wound cleanser prior to applying a clean dressing using gauze sponges, not tissue or cotton balls. [name], [name] [id number]) [id number] Page 5 of 12 Peri-Wound Care: Zinc Oxide Ointment 30g tube 1 x Per Week/30 Days Discharge Instructions: Apply Zinc Oxide to periwound with each dressing change Topical: Gentamicin 1 x Per Week/30 Days Discharge Instructions: As directed by physician Topical: Mupirocin Ointment 1 x Per Week/30 Days Discharge Instructions: Apply Mupirocin (Bactroban) as instructed Prim Dressing: Maxorb Extra Ag+ Alginate Dressing, 2x2 (in/in) 1 x Per Week/30 Days ary Discharge Instructions: Apply to wound bed as instructed Secondary Dressing: Woven Gauze Sponge, Non-Sterile 4x4 in 1 x Per Week/30 Days Discharge Instructions: Apply over primary dressing as directed. Secondary Dressing: Zetuvit Plus 4x8 in 1 x Per Week/30 Days Discharge Instructions: Apply over primary dressing as directed. Secured With: [employer], 4.5x3.1 (in/yd) 1 x Per Week/30 Days Discharge Instructions: Secure with Kerlix as directed. Secured With: 54M Medipore H Soft Cloth  Surgical T ape, 4 x 10 (in/yd) 1 x Per Week/30 Days Discharge Instructions: Secure with tape as directed. Patient Medications llergies: No Known Drug Allergies A Notifications Medication Indication Start End 05/25/2023 lidocaine DOSE topical 4 % cream - cream topical Electronic Signature(s) Signed: 05/25/2023 10:39:53 AM By: Duanne Guess MD FACS Entered By: Duanne Guess on 05/25/2023 06:25:14 -------------------------------------------------------------------------------- Problem List Details Patient Name: Date of Service: [name], [name] 05/25/2023 9:15 A M Medical Record Number: [id number] Patient Account Number: [id number] Date of Birth/Sex: Treating RN: 11/12/1955 (67 y.o. M) Primary Care Provider: Arva Chafe Other Clinician: Referring Provider: Treating Provider/Extender: [name] in Treatment: 38 Active Problems ICD-10 Encounter Code Description Active Date MDM Diagnosis L97.512 Non-pressure chronic ulcer of other part of right foot with fat layer exposed 08/31/2022 No Yes R60.0 Localized edema 05/04/2023 No Yes I73.9 Peripheral vascular disease, unspecified 08/31/2022 No Yes I25.10 Atherosclerotic heart disease of native coronary artery without angina pectoris 08/31/2022 No Yes [name], [name] ([id number]) [id number] Page 6 of 12 I10 Essential (primary) hypertension 08/31/2022 No Yes E11.65 Type 2 diabetes mellitus with hyperglycemia 08/31/2022 No Yes E11.40 Type 2 diabetes mellitus with diabetic neuropathy, unspecified 08/31/2022 No Yes I63.9 Cerebral infarction, unspecified 08/31/2022 No Yes Inactive Problems Resolved Problems ICD-10 Code Description Active Date Resolved Date L97.821 Non-pressure chronic ulcer of other part of left lower leg limited to breakdown of skin 11/05/2022 11/05/2022 Electronic Signature(s) Signed: 05/25/2023 9:21:15 AM By: Duanne Guess MD FACS Entered By: Duanne Guess on  05/25/2023 06:21:14 -------------------------------------------------------------------------------- Progress Note Details Patient Name: Date of Service: [name], [name] 05/25/2023 9:15 A M Medical Record Number: [id number] Patient Account Number: [id number] Date of Birth/Sex: Treating RN: 07/07/1956 (67 y.o. M) Primary Care Provider: Arva Chafe Other Clinician: Referring Provider: Treating Provider/Extender: [name]  in Treatment: 38 Subjective Chief Complaint Information obtained from Patient 05/14/2020; patient is here for review of an abrasion injury on the left lateral calf 08/31/2022: [name] (1st metatarsal base, plantar) History of Present Illness (HPI) ADMISSION 05/14/2020; this is a 67 year old man with multiple medical issues. Predominantly he has type 2 diabetes with a history of peripheral neuropathy and also history of fairly significant PAD. He had a left superficial femoral to posterior tibial artery bypass in February 2017 he also had an atherectomy and angioplasty by Dr. [name] of the right popliteal artery in 2016. He is supposed to be getting arterial studies annually however this was interrupted last year because of the pandemic. He tells [place] he was at [facility] 2 weeks ago was getting out of of the scooter and traumatized his left lateral lower leg. There was a lot of bleeding as the patient is on Plavix and Eliquis. They have been dressing this with Neosporin and doing a fairly good job. Wound measures 2.5 x 3.5 it does not have any depth he does not have a wound history in his legs outside of surgery however he does have chronic edema and skin changes suggestive of chronic venous disease possibly some degree of lymphedema as well. Past medical history includes type 2 diabetes with peripheral neuropathy and gait instability, lumbar spondylosis, obstructive sleep apnea, history of a left pontine CVA, basal cell skin cancer,  atrial fibrillation on anticoagulation, significant PAD as noted with a left superficial femoral to posterior tibial bypass in February 2017 and a right popliteal atherectomy and angioplasty by Dr. [name] in 30th 2016. He also has a history of coronary artery disease with an MI in 2002 hypertension hyperlipidemia and heart failure with preserved ejection fraction His last arterial studies I can see in epic were on 03/10/2018 this showed a right ABI of 0.69 and a right TBI of 0.5 with monophasic waveforms on the right. On the left his ABI was 1.20 with a TBI of 0.92 and triphasic waveforms. He has not had arterial studies since. Our nurse in the clinic got an ABI on the left of 1.1 11/2; left anterior leg wound in the setting of chronic venous insufficiency. Wound was initially trauma. We have been using Hydrofera Blue under compression he has home health.. The wound looks a lot better today with improvement in surface area 11/16; left anterior leg wound in the setting of chronic venous insufficiency. Wound was initially trauma. We have been using Hydrofera Blue under Coley, Toshio A ([id number]) [phone].pdf Page 7 of 12 compression. The patient is closed today. He is supposed to follow-up with vein and vascular with regards to arterial insufficiency nevertheless his leg wounds are 12/3; apparently 2 weeks ago when they were putting on their stockings they managed to get 3 wounds on the left anterior lower leg from abrasion when putting on the stockings. Home health came by the week of Thanksgiving and put Hydrofera Blue 4-layer wrap on this and there is only one superficial area remaining. The patient and his wife complained about the difficulties getting stockings on I think we are using 20/30. We will order bilateral external compression stockings which should be easier. 12/10; wound on the left anterior lower leg is closed. He has chronic venous insufficiency we ordered him  Farrow wrap stockings unfortunately he did not bring these in. 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 He says that it has been  present since mid December. He is currently residing in [facility] until March and a podiatrist has been looking after him there. They have been simply painting the area with Betadine. He has not had any lower extremity arterial studies since 2019, at which time his right ABI was 0.69. Measured in clinic today, it was 0.71. He is not aware of his most recent hemoglobin A1c, but historically he has had exceptionally poor control. On the basis of his right first metatarsal, there is a small crescent shaped wound. There is surrounding eschar and callus. There is no malodor or purulent drainage. 09/08/2022: The original wound is smaller today and fairly clean, but there is some discoloration and a pulpy texture to the adjacent callus. Underneath this, the tissue is open exposing the fat layer. It looks like perhaps there was a crack in the callus and moisture got under the skin and caused breakdown. 09/15/2022: There has been more moisture related tissue breakdown. The callus is very soft and the underlying tissue is more open. 09/28/2022: The wound looks much better. He has done a good job keeping it dry. There is some callus overlying much of the wound surface. There is slough on the exposed open areas. 10/14/2022: There has been a lot of moisture related tissue breakdown. He is still forming callus over the top and then it seems that moisture gets underneath the callus and causes tissue damage. There is slough on the exposed wound surface. [place] will [place] the [place] this [place]. 10/21/2022: His foot is less wet, but there is no significant change to his wound. He has developed a blister on his left anterior tibial surface. There is no open wound here, but he does have some  fairly significant edema. We are planning to apply a total contact cast today. 10/23/2022: Here for his obligatory first cast change. He says he has not had any issues wearing the cast or walking in the boot. No detrimental effects on his wound. 10/29/2022: The wound is looking better. Clearly, putting him in the cast has prevented water from getting in under his callus and causing further tissue breakdown. The blister on his left anterior tibial surface has not yet ruptured. Edema control is significantly improved. 11/05/2022: The wound on his right plantar foot is getting better. There is still some callus accumulation around the wounds, but there are just 2 open areas now, a very small 1 on the medial aspect of his metatarsal head and a little bit larger 1 on the plantar surface. The blister on his left anterior tibial surface is now open with hanging dry skin. 11/12/2022: The left anterior tibial wound is closed. The wound on his right plantar foot is smaller with just a little bit of callus around the edges. 11/19/2022: The right plantar foot wound continues to contract. The surface is quite clean. The tiny satellite area on the medial aspect of his foot has closed. 11/26/2022: The wound is smaller again today. He is responding well to the offloading effects of total contact casting. 12/03/2022: The wound is about the same size today. There is a little bit of moisture around the perimeter of the wound. No significant tissue breakdown. 12/10/2022: The wound is smaller today. There has been no moisture-related tissue breakdown of any maceration. 12/21/2022: The wound continues to contract. Moisture control is excellent. 12/28/2022: The wound is a little bit smaller today. 01/05/2023: The wound measures smaller today. Excellent moisture control with zero evidence  of maceration. The wound surface is clean with healthy granulation tissue. 01/13/2023: We are leaving his Apligraf in situ for a second week. There is  no discolored drainage or malodor coming from the site. 01/19/2023: The wound is smaller today. The periwound skin is in good condition without tissue maceration and the healed areas of tissue appear to have improved integrity. 01/27/2023: Continued contraction of the wound. No tissue maceration. The wound surface is flush with the surrounding skin. 02/02/2023: The wound measured a little bit smaller today. There is some bruising just adjacent to the wound, but this has not resulted in any tissue breakdown. 02/09/2023: The wound is smaller again today. The area of bruising that was seen last week has resolved and there has been no tissue damage as a result. There is some callus accumulation around the wound edges along with minimal soft slough on the surface. 02/16/2023: The wound measured a little bit larger today. There is a little bit of maceration around the wound edges, which is likely responsible. 02/23/2023: The wound is smaller today and there is epithelium encroaching around the edges. There is no periwound tissue maceration, nor has he accumulated any significant callus. 03/03/2023: His wound continues to contract. There is no maceration nor any significant callus. There is slight slough on the wound surface. 03/09/2023: The wound is a little bit smaller again today. He is managing very well in the peg assist offloading insert and there has been no tissue breakdown or periwound maceration as a result of being out of the total contact cast. 03/16/2023: The wound measured smaller again today. He continues to do well using the peg assist offloading insert. 03/23/2023: Although the wound measured the same size today, it visually appears smaller to me. There is good granulation tissue with minimal slough. There is a little callus around the edges. 03/30/2023: The wound is smaller today. The granulation tissue is healthy. There is a little bit of callus accumulation around the edges. 04/06/2023: Unfortunately,  he did not have any assistance to change his dressing during the week and he was also walking substantially more due to attending a [name], [name] ([id number]) [phone].pdf Page 8 of 12 family event. This has resulted in fairly significant breakdown of his wound. Much of this seems to be related to moisture accumulation. 04/13/2023: The wound looks markedly better this week after being in the total contact cast. There is no moisture-related tissue breakdown, and the wound opening is smaller. 04/20/2023: The wound is smaller again today. The periwound skin is nice and dry and there is no concern for infection. 04/27/2023: The wound continues to contract. The surface is clean. There is a little bit of periwound moisture, but the wound itself has not broken down at all. 05/04/2023: The wound is stable this week. He has had an increase in his left lower leg swelling, however, and he has an intact blister on the distal medial aspect of the leg, adjacent to where his saphenous vein was harvested. 05/11/2023: The wound is a little bit narrower from 12-6 o'clock but slightly wider from 9-3 o'clock. It is otherwise fairly clean. The blister on his left lower leg is still present but flat. Edema is improved. 05/18/2023: The blister on his left lower leg has resolved. The wound on his right first metatarsal head is smaller today. There is a little bit of slough, callus, and eschar present. Moisture balance is good. 05/25/2023: His first metatarsal head wound is down to just a narrow slit. No  concern for infection. Patient History Information obtained from Patient. Family History Unknown History. Social History Never smoker, Marital Status - Single, Alcohol Use - Never, Drug Use - No History, Caffeine Use - Rarely. Medical History Respiratory Patient has history of Sleep Apnea Cardiovascular Patient has history of Arrhythmia - A-Fib, Coronary Artery Disease - s/p CABG, Deep Vein  Thrombosis, Hypertension, Myocardial Infarction, Peripheral Arterial Disease - s/p Fempop x 2 Endocrine Patient has history of Type II Diabetes Neurologic Patient has history of Neuropathy Hospitalization/Surgery History - colonoscopy. - polypectomy. - peripheral vascular cath. - shoulder arthroscopy. - carpal tunnel release. - coronary artery bypass. - appendectomy. - cardiac cath. - coronary angioplasty. - thrombectomy. - knee arthroplasty. - popliteal artery stent. 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 no acute distress. Vitals Time Taken: 9:02 AM, Height: 70 in, Weight: 260 lbs, BMI: 37.3, Temperature: 98.9 F, Pulse: 61 bpm, Respiratory Rate: 16 breaths/min, Blood Pressure: 191/81 mmHg. Respiratory Normal work of breathing on room air.. General Notes: 05/25/2023: His first metatarsal head wound is down to just a narrow slit. No concern for infection. Integumentary (Hair, Skin) Wound #2 status is Open. Original cause of wound was Gradually Appeared. The date acquired was: 07/08/2022. The wound has been in treatment 38 weeks. The wound is located on the Right,Plantar Foot. The wound measures 0.3cm length x 0.3cm width x 0.1cm depth; 0.071cm^2 area and 0.007cm^3 volume. There is Fat Layer (Subcutaneous Tissue) exposed. There is no tunneling or undermining noted. There is a medium amount of serosanguineous drainage noted. The wound margin is distinct with the outline attached to the wound base. There is small (1-33%) red granulation within the wound bed. There is a large (67-100%) amount of necrotic tissue within the wound bed including Eschar. The periwound skin appearance had no abnormalities noted for moisture. The periwound skin appearance had no abnormalities noted for color. The periwound skin appearance exhibited: Callus.  Periwound temperature was noted as No Abnormality. [name], [name] [phone] [id number] [id number] Page 9 of 12 Assessment Active Problems ICD-10 Non-pressure chronic ulcer of other part of right foot with fat layer exposed Localized edema Peripheral vascular disease, unspecified Atherosclerotic heart disease of native coronary artery without angina pectoris Essential (primary) hypertension Type 2 diabetes mellitus with hyperglycemia Type 2 diabetes mellitus with diabetic neuropathy, unspecified Cerebral infarction, unspecified Procedures Wound #2 Pre-procedure diagnosis of Wound #2 is a Diabetic Wound/Ulcer of the Lower Extremity located on the Right,Plantar Foot .Severity of Tissue Pre Debridement is: Fat layer exposed. There was a Excisional Skin/Subcutaneous Tissue Debridement with a total area of 0.07 sq cm performed by Duanne Guess, MD. With the following instrument(s): Curette to remove Non-Viable tissue/material. Material removed includes Callus, Subcutaneous Tissue, and Slough after achieving pain control using Lidocaine 5% topical ointment. No specimens were taken. A time out was conducted at 09:16, prior to the start of the procedure. A Minimum amount of bleeding was controlled with Pressure. The procedure was tolerated well. Post Debridement Measurements: 0.3cm length x 0.3cm width x 0.1cm depth; 0.007cm^3 volume. Character of Wound/Ulcer Post Debridement is improved. Severity of Tissue Post Debridement is: Fat layer exposed. Post procedure Diagnosis Wound #2: Same as Pre-Procedure Pre-procedure diagnosis of Wound #2 is a Diabetic Wound/Ulcer of the Lower Extremity located on the Right,Plantar Foot . There was a T Contact Cast otal Procedure by Duanne Guess, MD. Post procedure Diagnosis Wound #2: Same as Pre-Procedure Plan Follow-up Appointments:  Return Appointment in 1 week. - Dr. Lady Gary - room 2 TCC Anesthetic: (In clinic) Topical Lidocaine 5%  applied to wound bed Bathing/ Shower/ Hygiene: May shower with protection but do not get wound dressing(s) wet. Protect dressing(s) with water repellant cover (for example, large plastic bag) or a cast cover and may then take shower. Off-Loading: T Contact Cast to Right Lower Extremity otal Removable cast walker boot to: - right leg Non Wound Condition: Other Non Wound Condition Orders/Instructions: - compression wrap urgo lite to left leg The following medication(s) was prescribed: lidocaine topical 4 % cream cream topical was prescribed at facility WOUND #2: - Foot Wound Laterality: Plantar, Right Cleanser: Soap and Water 1 x Per Week/30 Days Discharge Instructions: May shower and wash wound with dial antibacterial soap and water prior to dressing change. Cleanser: Wound Cleanser 1 x Per Week/30 Days Discharge Instructions: Cleanse the wound with wound cleanser prior to applying a clean dressing using gauze sponges, not tissue or cotton balls. Peri-Wound Care: Zinc Oxide Ointment 30g tube 1 x Per Week/30 Days Discharge Instructions: Apply Zinc Oxide to periwound with each dressing change Topical: Gentamicin 1 x Per Week/30 Days Discharge Instructions: As directed by physician Topical: Mupirocin Ointment 1 x Per Week/30 Days Discharge Instructions: Apply Mupirocin (Bactroban) as instructed Prim Dressing: Maxorb Extra Ag+ Alginate Dressing, 2x2 (in/in) 1 x Per Week/30 Days ary Discharge Instructions: Apply to wound bed as instructed Secondary Dressing: Woven Gauze Sponge, Non-Sterile 4x4 in 1 x Per Week/30 Days Discharge Instructions: Apply over primary dressing as directed. Secondary Dressing: Zetuvit Plus 4x8 in 1 x Per Week/30 Days Discharge Instructions: Apply over primary dressing as directed. Secured With: [employer], 4.5x3.1 (in/yd) 1 x Per Week/30 Days Discharge Instructions: Secure with Kerlix as directed. Secured With: 20M Medipore H Soft Cloth Surgical T ape, 4 x 10  (in/yd) 1 x Per Week/30 Days Discharge Instructions: Secure with tape as directed. 05/25/2023: His first metatarsal head wound is down to just a narrow slit. No concern for infection. I used a curette to debride callus, slough, and subcutaneous tissue from the wound. We applied topical gentamicin and mupirocin, followed by silver alginate. A total contact cast was then applied. I am hopeful, that if he stays on this trajectory, he will be closed within about a month. Follow-up in 1 week. [name], [name] [phone] [id number].pdf Page 10 of 12 Electronic Signature(s) Signed: 05/25/2023 9:25:56 AM By: Duanne Guess MD FACS Entered By: Duanne Guess on 05/25/2023 06:25:55 -------------------------------------------------------------------------------- HxROS Details Patient Name: Date of Service: [name], [name] 05/25/2023 9:15 A M Medical Record Number: [id number] Patient Account Number: [id number] Date of Birth/Sex: Treating RN: [birth year] (67 y.o. M) Primary Care Provider: Arva Chafe Other Clinician: Referring Provider: Treating Provider/Extender: [name] in Treatment: 38 Information Obtained From Patient Respiratory Medical History: Positive for: Sleep Apnea Cardiovascular Medical History: Positive for: Arrhythmia - A-Fib; Coronary Artery Disease - s/p CABG; Deep Vein Thrombosis; Hypertension; Myocardial Infarction; Peripheral Arterial Disease - s/p Fempop x 2 Past Medical History Notes: CVA x 3 Endocrine Medical History: Positive for: Type II Diabetes Time with diabetes: since 1997 Treated with: Insulin, Oral agents Blood sugar tested every day: Yes Tested : 2-3x per day Musculoskeletal Medical History: Past Medical History Notes: carpal tunnel syndrome Neurologic Medical History: Positive for: Neuropathy Past Medical History Notes: stroke Oncologic Medical History: Past Medical History Notes: skin  cancer Immunizations Pneumococcal Vaccine: Received Pneumococcal Vaccination: Yes Received Pneumococcal Vaccination On or After 60th Birthday:  Yes Implantable Devices None Hospitalization / Surgery History Type of Hospitalization/Surgery colonoscopy [name], [name] ([id number]) [id number] Page 11 of 12 polypectomy peripheral vascular cath shoulder arthroscopy carpal tunnel release coronary artery bypass appendectomy cardiac cath coronary angioplasty thrombectomy knee arthroplasty popliteal artery stent Family and Social History Unknown History: Yes; Never smoker; Marital Status - Single; Alcohol Use: Never; Drug Use: No History; Caffeine Use: Rarely; Financial Concerns: No; Food, Clothing or Shelter Needs: No; Support System Lacking: No; Transportation Concerns: No Electronic Signature(s) Signed: 05/25/2023 10:39:53 AM By: Duanne Guess MD FACS Entered By: Duanne Guess on 05/25/2023 06:22:25 -------------------------------------------------------------------------------- Total Contact Cast Details Patient Name: Date of Service: [name], [name] 05/25/2023 9:15 A M Medical Record Number: [id number] Patient Account Number: [id number] Date of Birth/Sex: Treating RN: 27-Dec-1955 (67 y.o. [name] Primary Care Provider: Arva Chafe Other Clinician: Referring Provider: Treating Provider/Extender: [name] in Treatment: 38 T Contact Cast Applied for Wound Assessment: otal Wound #2 Right,Plantar Foot Performed By: Physician Duanne Guess, MD The following information was scribed by: Samuella Bruin The information was scribed for: Duanne Guess Post Procedure Diagnosis Same as Pre-procedure Electronic Signature(s) Signed: 05/25/2023 10:39:53 AM By: Duanne Guess MD FACS Signed: 05/25/2023 3:30:49 PM By: Samuella Bruin Entered By: Samuella Bruin on 05/25/2023  06:18:20 -------------------------------------------------------------------------------- SuperBill Details Patient Name: Date of Service: [name], [name] 05/25/2023 Medical Record Number: [id number] Patient Account Number: [id number] Date of Birth/Sex: Treating RN: [birth year] (67 y.o. M) Primary Care Provider: Arva Chafe Other Clinician: Referring Provider: Treating Provider/Extender: [name] in Treatment: 38 Diagnosis Coding ICD-10 Codes Code Description [phone] Non-pressure chronic ulcer of other part of right foot with fat layer exposed R60.0 Localized edema Voges, Laquon A ([id number]) [id number].pdf Page 12 of 12 I73.9 Peripheral vascular disease, unspecified I25.10 Atherosclerotic heart disease of native coronary artery without angina pectoris I10 Essential (primary) hypertension E11.65 Type 2 diabetes mellitus with hyperglycemia E11.40 Type 2 diabetes mellitus with diabetic neuropathy, unspecified I63.9 Cerebral infarction, unspecified Facility Procedures : CPT4 Code: [id number] Description: 11042 - DEB SUBQ TISSUE 20 SQ CM/< ICD-10 Diagnosis Description L97.512 Non-pressure chronic ulcer of other part of right foot with fat layer exposed Modifier: Quantity: 1 Physician Procedures : CPT4 Code Description Modifier [id number] - WC PHYS LEVEL 4 - EST PT ICD-10 Diagnosis Description L97.512 Non-pressure chronic ulcer of other part of right foot with fat layer exposed R60.0 Localized edema I73.9 Peripheral vascular disease,  unspecified E11.65 Type 2 diabetes mellitus with hyperglycemia Quantity: 1 : [id number] 11042 - WC PHYS SUBQ TISS 20 SQ CM ICD-10 Diagnosis Description L97.512 Non-pressure chronic ulcer of other part of right foot with fat layer exposed Quantity: 1 Electronic Signature(s) Signed: 05/25/2023 9:26:16 AM By: Duanne Guess MD FACS Entered By: Duanne Guess on 05/25/2023 06:26:15

## 2023-05-28 DIAGNOSIS — E1165 Type 2 diabetes mellitus with hyperglycemia: Secondary | ICD-10-CM | POA: Diagnosis not present

## 2023-06-01 ENCOUNTER — Ambulatory Visit: Payer: Medicare HMO | Admitting: Neurology

## 2023-06-01 ENCOUNTER — Encounter: Payer: Self-pay | Admitting: Neurology

## 2023-06-01 ENCOUNTER — Encounter (HOSPITAL_BASED_OUTPATIENT_CLINIC_OR_DEPARTMENT_OTHER): Payer: Medicare HMO | Admitting: General Surgery

## 2023-06-01 VITALS — BP 160/88 | HR 86 | Ht 70.0 in | Wt 260.0 lb

## 2023-06-01 DIAGNOSIS — R0609 Other forms of dyspnea: Secondary | ICD-10-CM

## 2023-06-01 DIAGNOSIS — E1142 Type 2 diabetes mellitus with diabetic polyneuropathy: Secondary | ICD-10-CM | POA: Diagnosis not present

## 2023-06-01 DIAGNOSIS — I2510 Atherosclerotic heart disease of native coronary artery without angina pectoris: Secondary | ICD-10-CM | POA: Diagnosis not present

## 2023-06-01 DIAGNOSIS — I5032 Chronic diastolic (congestive) heart failure: Secondary | ICD-10-CM | POA: Diagnosis not present

## 2023-06-01 DIAGNOSIS — Z951 Presence of aortocoronary bypass graft: Secondary | ICD-10-CM | POA: Diagnosis not present

## 2023-06-01 DIAGNOSIS — I872 Venous insufficiency (chronic) (peripheral): Secondary | ICD-10-CM | POA: Diagnosis not present

## 2023-06-01 DIAGNOSIS — Z7902 Long term (current) use of antithrombotics/antiplatelets: Secondary | ICD-10-CM | POA: Diagnosis not present

## 2023-06-01 DIAGNOSIS — G4733 Obstructive sleep apnea (adult) (pediatric): Secondary | ICD-10-CM | POA: Diagnosis not present

## 2023-06-01 DIAGNOSIS — L97512 Non-pressure chronic ulcer of other part of right foot with fat layer exposed: Secondary | ICD-10-CM | POA: Diagnosis not present

## 2023-06-01 DIAGNOSIS — Z8669 Personal history of other diseases of the nervous system and sense organs: Secondary | ICD-10-CM | POA: Insufficient documentation

## 2023-06-01 DIAGNOSIS — I252 Old myocardial infarction: Secondary | ICD-10-CM | POA: Diagnosis not present

## 2023-06-01 DIAGNOSIS — I4891 Unspecified atrial fibrillation: Secondary | ICD-10-CM | POA: Diagnosis not present

## 2023-06-01 DIAGNOSIS — E11621 Type 2 diabetes mellitus with foot ulcer: Secondary | ICD-10-CM | POA: Diagnosis not present

## 2023-06-01 DIAGNOSIS — I739 Peripheral vascular disease, unspecified: Secondary | ICD-10-CM | POA: Diagnosis not present

## 2023-06-01 DIAGNOSIS — I110 Hypertensive heart disease with heart failure: Secondary | ICD-10-CM | POA: Diagnosis not present

## 2023-06-01 DIAGNOSIS — E1151 Type 2 diabetes mellitus with diabetic peripheral angiopathy without gangrene: Secondary | ICD-10-CM | POA: Diagnosis not present

## 2023-06-01 DIAGNOSIS — Z7901 Long term (current) use of anticoagulants: Secondary | ICD-10-CM | POA: Diagnosis not present

## 2023-06-01 NOTE — Progress Notes (Signed)
Mccarroll, Evo A [phone]) [id number] Page 1 of 12 Visit Report for 06/01/2023 Chief Complaint Document Details Patient Name: Date of Service: [name], [name] RK A. 06/01/2023 9:15 A M Medical Record Number: [id number] Patient Account Number: [id number] Date of Birth/Sex: Treating RN: Nov 30, 1955 (67 y.o. M) Primary Care Provider: Arva Chafe Other Clinician: Referring Provider: Treating Provider/Extender: [name] in Treatment: 39 Information Obtained from: Patient Chief Complaint 05/14/2020; patient is here for review of an abrasion injury on the left lateral calf 08/31/2022: [name] (1st metatarsal base, plantar) Electronic Signature(s) Signed: 06/01/2023 9:50:23 AM By: Duanne Guess MD FACS Entered By: Duanne Guess on 06/01/2023 06:50:23 -------------------------------------------------------------------------------- Debridement Details Patient Name: Date of Service: [name] Pian, [name] 06/01/2023 9:15 A M Medical Record Number: [id number] Patient Account Number: [id number] Date of Birth/Sex: Treating RN: 04-26-1956 (67 y.o. [name] Primary Care Provider: Arva Chafe Other Clinician: Referring Provider: Treating Provider/Extender: [name] in Treatment: 39 Debridement Performed for Assessment: Wound #2 Right,Plantar Foot Performed By: Physician Duanne Guess, MD The following information was scribed by: Samuella Bruin The information was scribed for: Duanne Guess Debridement Type: Debridement Severity of Tissue Pre Debridement: Fat layer exposed Level of Consciousness (Pre-procedure): Awake and Alert Pre-procedure Verification/Time Out Yes - 09:31 Taken: Start Time: 09:31 Pain Control: Lidocaine 5% topical ointment Percent of Wound Bed Debrided: 100% T Area Debrided (cm): otal 0.21 Tissue and other material debrided: Non-Viable, Eschar,  Subcutaneous, Skin: Epidermis Level: Skin/Subcutaneous Tissue Debridement Description: Excisional Instrument: Curette Bleeding: Minimum Hemostasis Achieved: Pressure Response to Treatment: Procedure was tolerated well Level of Consciousness (Post- Awake and Alert procedure): Post Debridement Measurements of Total Wound Length: (cm) 0.3 Width: (cm) 0.9 Depth: (cm) 0.1 Volume: (cm) 0.021 [name], [name] A ([id number]) [id number].pdf Page 2 of 12 Character of Wound/Ulcer Post Debridement: Improved Severity of Tissue Post Debridement: Fat layer exposed Post Procedure Diagnosis Same as Pre-procedure Electronic Signature(s) Signed: 06/01/2023 10:34:59 AM By: Duanne Guess MD FACS Signed: 06/01/2023 3:33:50 PM By: Samuella Bruin Entered By: Samuella Bruin on 06/01/2023 06:32:58 -------------------------------------------------------------------------------- HPI Details Patient Name: Date of Service: [name] Pian, [name] 06/01/2023 9:15 A M Medical Record Number: [id number] Patient Account Number: [id number] Date of Birth/Sex: Treating RN: Jan 19, 1956 (67 y.o. M) Primary Care Provider: Arva Chafe Other Clinician: Referring Provider: Treating Provider/Extender: [name] in Treatment: 39 History of Present Illness HPI Description: ADMISSION 05/14/2020; this is a 67 year old man with multiple medical issues. Predominantly he has type 2 diabetes with a history of peripheral neuropathy and also history of fairly significant PAD. He had a left superficial femoral to posterior tibial artery bypass in February 2017 he also had an atherectomy and angioplasty by Dr. [name] of the right popliteal artery in 2016. He is supposed to be getting arterial studies annually however this was interrupted last year because of the pandemic. He tells [place] he was at [facility] 2 weeks ago was getting out of of the scooter and traumatized his left  lateral lower leg. There was a lot of bleeding as the patient is on Plavix and Eliquis. They have been dressing this with Neosporin and doing a fairly good job. Wound measures 2.5 x 3.5 it does not have any depth he does not have a wound history in his legs outside of surgery however he does have chronic edema and skin changes suggestive of chronic venous disease possibly some degree of lymphedema as well. Past medical history includes type 2 diabetes with peripheral neuropathy and gait  instability, lumbar spondylosis, obstructive sleep apnea, history of a left pontine CVA, basal cell skin cancer, atrial fibrillation on anticoagulation, significant PAD as noted with a left superficial femoral to posterior tibial bypass in February 2017 and a right popliteal atherectomy and angioplasty by Dr. [name] in 30th 2016. He also has a history of coronary artery disease with an MI in 2002 hypertension hyperlipidemia and heart failure with preserved ejection fraction His last arterial studies I can see in epic were on 03/10/2018 this showed a right ABI of 0.69 and a right TBI of 0.5 with monophasic waveforms on the right. On the left his ABI was 1.20 with a TBI of 0.92 and triphasic waveforms. He has not had arterial studies since. Our nurse in the clinic got an ABI on the left of 1.1 11/2; left anterior leg wound in the setting of chronic venous insufficiency. Wound was initially trauma. We have been using Hydrofera Blue under compression he has home health.. The wound looks a lot better today with improvement in surface area 11/16; left anterior leg wound in the setting of chronic venous insufficiency. Wound was initially trauma. We have been using Hydrofera Blue under compression. The patient is closed today. He is supposed to follow-up with vein and vascular with regards to arterial insufficiency nevertheless his leg wounds are 12/3; apparently 2 weeks ago when they were putting on their stockings they managed  to get 3 wounds on the left anterior lower leg from abrasion when putting on the stockings. Home health came by the week of Thanksgiving and put Hydrofera Blue 4-layer wrap on this and there is only one superficial area remaining. The patient and his wife complained about the difficulties getting stockings on I think we are using 20/30. We will order bilateral external compression stockings which should be easier. 12/10; wound on the left anterior lower leg is closed. He has chronic venous insufficiency we ordered him Farrow wrap stockings unfortunately he did not bring these in. 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 He says that it has been present since mid December. He is currently residing in [facility] until March and a podiatrist has been looking after him there. They have been simply painting the area with Betadine. He has not had any lower extremity arterial studies since 2019, at which time his right ABI was 0.69. Measured in clinic today, it was 0.71. He is not aware of his most recent hemoglobin A1c, but historically he has had exceptionally poor control. On the basis of his right first metatarsal, there is a small crescent shaped wound. There is surrounding eschar and callus. There is no malodor or purulent drainage. 09/08/2022: The original wound is smaller today and fairly clean, but there is some discoloration and a pulpy texture to the adjacent callus. Underneath this, the tissue is open exposing the fat layer. It looks like perhaps there was a crack in the callus and moisture got under the skin and caused breakdown. 09/15/2022: There has been more moisture related tissue breakdown. The callus is very soft and the underlying tissue is more open. 09/28/2022: The wound looks much better. He has done a good job keeping it dry. There is some callus overlying much of the wound surface. There is slough on the exposed  open areas. 10/14/2022: There has been a lot of moisture related tissue breakdown. He is still forming callus over the top and then it seems that moisture  gets underneath the callus and causes tissue damage. There is slough on the exposed wound surface. [place]. Mercadel, Lewayne A [phone]) [id number].pdf Page 3 of 12 10/21/2022: His foot is less wet, but there is no significant change to his wound. He has developed a blister on his left anterior tibial surface. There is no open wound here, but he does have some fairly significant edema. We are planning to apply a total contact cast today. 10/23/2022: Here for his obligatory first cast change. He says he has not had any issues wearing the cast or walking in the boot. No detrimental effects on his wound. 10/29/2022: The wound is looking better. Clearly, putting him in the cast has prevented water from getting in under his callus and causing further tissue breakdown. The blister on his left anterior tibial surface has not yet ruptured. Edema control is significantly improved. 11/05/2022: The wound on his right plantar foot is getting better. There is still some callus accumulation around the wounds, but there are just 2 open areas now, a very small 1 on the medial aspect of his metatarsal head and a little bit larger 1 on the plantar surface. The blister on his left anterior tibial surface is now open with hanging dry skin. 11/12/2022: The left anterior tibial wound is closed. The wound on his right plantar foot is smaller with just a little bit of callus around the edges. 11/19/2022: The right plantar foot wound continues to contract. The surface is quite clean. The tiny satellite area on the medial aspect of his foot has closed. 11/26/2022: The wound is smaller again today. He is responding well to the offloading effects of total contact casting. 12/03/2022: The wound is about the  same size today. There is a little bit of moisture around the perimeter of the wound. No significant tissue breakdown. 12/10/2022: The wound is smaller today. There has been no moisture-related tissue breakdown of any maceration. 12/21/2022: The wound continues to contract. Moisture control is excellent. 12/28/2022: The wound is a little bit smaller today. 01/05/2023: The wound measures smaller today. Excellent moisture control with zero evidence of maceration. The wound surface is clean with healthy granulation tissue. 01/13/2023: We are leaving his Apligraf in situ for a second week. There is no discolored drainage or malodor coming from the site. 01/19/2023: The wound is smaller today. The periwound skin is in good condition without tissue maceration and the healed areas of tissue appear to have improved integrity. 01/27/2023: Continued contraction of the wound. No tissue maceration. The wound surface is flush with the surrounding skin. 02/02/2023: The wound measured a little bit smaller today. There is some bruising just adjacent to the wound, but this has not resulted in any tissue breakdown. 02/09/2023: The wound is smaller again today. The area of bruising that was seen last week has resolved and there has been no tissue damage as a result. There is some callus accumulation around the wound edges along with minimal soft slough on the surface. 02/16/2023: The wound measured a little bit larger today. There is a little bit of maceration around the wound edges, which is likely responsible. 02/23/2023: The wound is smaller today and there is epithelium encroaching around the edges. There is no periwound tissue maceration, nor has he accumulated any significant callus. 03/03/2023: His wound continues to contract. There is no maceration nor any significant callus. There is slight slough on the wound surface. 03/09/2023: The  wound is a little bit smaller again today. He is managing very well in the peg assist  offloading insert and there has been no tissue breakdown or periwound maceration as a result of being out of the total contact cast. 03/16/2023: The wound measured smaller again today. He continues to do well using the peg assist offloading insert. 03/23/2023: Although the wound measured the same size today, it visually appears smaller to me. There is good granulation tissue with minimal slough. There is a little callus around the edges. 03/30/2023: The wound is smaller today. The granulation tissue is healthy. There is a little bit of callus accumulation around the edges. 04/06/2023: Unfortunately, he did not have any assistance to change his dressing during the week and he was also walking substantially more due to attending a family event. This has resulted in fairly significant breakdown of his wound. Much of this seems to be related to moisture accumulation. 04/13/2023: The wound looks markedly better this week after being in the total contact cast. There is no moisture-related tissue breakdown, and the wound opening is smaller. 04/20/2023: The wound is smaller again today. The periwound skin is nice and dry and there is no concern for infection. 04/27/2023: The wound continues to contract. The surface is clean. There is a little bit of periwound moisture, but the wound itself has not broken down at all. 05/04/2023: The wound is stable this week. He has had an increase in his left lower leg swelling, however, and he has an intact blister on the distal medial aspect of the leg, adjacent to where his saphenous vein was harvested. 05/11/2023: The wound is a little bit narrower from 12-6 o'clock but slightly wider from 9-3 o'clock. It is otherwise fairly clean. The blister on his left lower leg is still present but flat. Edema is improved. 05/18/2023: The blister on his left lower leg has resolved. The wound on his right first metatarsal head is smaller today. There is a little bit of slough, callus,  and eschar present. Moisture balance is good. 05/25/2023: His first metatarsal head wound is down to just a narrow slit. No concern for infection. 06/01/2023: The wound has contracted further. There is a little bit of periwound dry skin and callus. Electronic Signature(s) Signed: 06/01/2023 9:50:54 AM By: Duanne Guess MD FACS Entered By: Duanne Guess on 06/01/2023 06:50:54 Lolli, Benney A ([id number]) [id number] Page 4 of 12 -------------------------------------------------------------------------------- Physical Exam Details Patient Name: Date of Service: BURKES, [name] RK A. 06/01/2023 9:15 A M Medical Record Number: [id number] Patient Account Number: [id number] Date of Birth/Sex: Treating RN: 11/09/1955 (67 y.o. M) Primary Care Provider: Arva Chafe Other Clinician: Referring Provider: Treating Provider/Extender: [name] in Treatment: 39 Constitutional Hypertensive, asymptomatic. . . . no acute distress. Respiratory Normal work of breathing on room air.. Notes 06/01/2023: The wound has contracted further. There is a little bit of periwound dry skin and callus. Electronic Signature(s) Signed: 06/01/2023 9:57:33 AM By: Duanne Guess MD FACS Entered By: Duanne Guess on 06/01/2023 06:57:32 -------------------------------------------------------------------------------- Physician Orders Details Patient Name: Date of Service: [name] Pian, [name] 06/01/2023 9:15 A M Medical Record Number: [id number] Patient Account Number: [id number] Date of Birth/Sex: Treating RN: 1956-07-11 (67 y.o. [name] Primary Care Provider: Arva Chafe Other Clinician: Referring Provider: Treating Provider/Extender: [name] in Treatment: 39 The following information was scribed by: Samuella Bruin The information was scribed for: Duanne Guess Verbal / Phone Orders: No Diagnosis  Coding ICD-10 Coding Code Description  L97.512 Non-pressure chronic ulcer of other part of right foot with fat layer exposed R60.0 Localized edema I73.9 Peripheral vascular disease, unspecified I25.10 Atherosclerotic heart disease of native coronary artery without angina pectoris I10 Essential (primary) hypertension E11.65 Type 2 diabetes mellitus with hyperglycemia E11.40 Type 2 diabetes mellitus with diabetic neuropathy, unspecified I63.9 Cerebral infarction, unspecified Follow-up Appointments ppointment in 1 week. - Dr. Lady Gary - room 2 TCC Return A Anesthetic (In clinic) Topical Lidocaine 5% applied to wound bed Bathing/ Shower/ Hygiene May shower with protection but do not get wound dressing(s) wet. Protect dressing(s) with water repellant cover (for example, large plastic bag) or a cast cover and may then take shower. Off-Loading Rosas, Coltyn A ([id number]) [id number].pdf Page 5 of 12 Total Contact Cast to Right Lower Extremity Removable cast walker boot to: - right leg Non Wound Condition Left Lower Extremity Other Non Wound Condition Orders/Instructions: - compression wrap urgo lite to left leg Wound Treatment Wound #2 - Foot Wound Laterality: Plantar, Right Cleanser: Soap and Water 1 x Per Week/30 Days Discharge Instructions: May shower and wash wound with dial antibacterial soap and water prior to dressing change. Cleanser: Wound Cleanser 1 x Per Week/30 Days Discharge Instructions: Cleanse the wound with wound cleanser prior to applying a clean dressing using gauze sponges, not tissue or cotton balls. Peri-Wound Care: Zinc Oxide Ointment 30g tube 1 x Per Week/30 Days Discharge Instructions: Apply Zinc Oxide to periwound with each dressing change Topical: Gentamicin 1 x Per Week/30 Days Discharge Instructions: As directed by physician Topical: Mupirocin Ointment 1 x Per Week/30 Days Discharge Instructions: Apply Mupirocin (Bactroban) as  instructed Prim Dressing: Maxorb Extra Ag+ Alginate Dressing, 2x2 (in/in) 1 x Per Week/30 Days ary Discharge Instructions: Apply to wound bed as instructed Secondary Dressing: Woven Gauze Sponge, Non-Sterile 4x4 in 1 x Per Week/30 Days Discharge Instructions: Apply over primary dressing as directed. Secondary Dressing: Zetuvit Plus 4x8 in 1 x Per Week/30 Days Discharge Instructions: Apply over primary dressing as directed. Secured With: [employer], 4.5x3.1 (in/yd) 1 x Per Week/30 Days Discharge Instructions: Secure with Kerlix as directed. Secured With: 66M Medipore H Soft Cloth Surgical T ape, 4 x 10 (in/yd) 1 x Per Week/30 Days Discharge Instructions: Secure with tape as directed. Patient Medications llergies: No Known Drug Allergies A Notifications Medication Indication Start End 06/01/2023 lidocaine DOSE topical 5 % ointment - ointment topical Electronic Signature(s) Signed: 06/01/2023 10:34:59 AM By: Duanne Guess MD FACS Entered By: Duanne Guess on 06/01/2023 07:02:42 -------------------------------------------------------------------------------- Problem List Details Patient Name: Date of Service: [name] Pian, [name] 06/01/2023 9:15 A M Medical Record Number: [id number] Patient Account Number: [id number] Date of Birth/Sex: Treating RN: 1955-09-19 (67 y.o. [name] Primary Care Provider: Arva Chafe Other Clinician: Referring Provider: Treating Provider/Extender: [name] in Treatment: 39 Active Problems ICD-10 Encounter Code Description Active Date MDM Diagnosis Allston, Daiki A ([id number]) [id number].pdf Page 6 of 12 [phone] Non-pressure chronic ulcer of other part of right foot with fat layer exposed 08/31/2022 No Yes R60.0 Localized edema 05/04/2023 No Yes I73.9 Peripheral vascular disease, unspecified 08/31/2022 No Yes I25.10 Atherosclerotic heart disease of native coronary artery  without angina pectoris 08/31/2022 No Yes I10 Essential (primary) hypertension 08/31/2022 No Yes E11.65 Type 2 diabetes mellitus with hyperglycemia 08/31/2022 No Yes E11.40 Type 2 diabetes mellitus with diabetic neuropathy, unspecified 08/31/2022 No Yes I63.9 Cerebral infarction, unspecified 08/31/2022 No Yes Inactive Problems Resolved Problems ICD-10 Code Description Active Date Resolved Date L97.821 Non-pressure chronic ulcer of other  part of left lower leg limited to breakdown of skin 11/05/2022 11/05/2022 Electronic Signature(s) Signed: 06/01/2023 9:49:34 AM By: Duanne Guess MD FACS Entered By: Duanne Guess on 06/01/2023 06:49:34 -------------------------------------------------------------------------------- Progress Note Details Patient Name: Date of Service: [name] Pian, [name] 06/01/2023 9:15 A M Medical Record Number: [id number] Patient Account Number: [id number] Date of Birth/Sex: Treating RN: 09-14-1955 (67 y.o. M) Primary Care Provider: Arva Chafe Other Clinician: Referring Provider: Treating Provider/Extender: [name] in Treatment: 39 Subjective Chief Complaint Information obtained from Patient 05/14/2020; patient is here for review of an abrasion injury on the left lateral calf 08/31/2022: [name] (1st metatarsal base, plantar) History of Present Illness (HPI) ADMISSION 05/14/2020; this is a 67 year old man with multiple medical issues. Predominantly he has type 2 diabetes with a history of peripheral neuropathy and also history of fairly significant PAD. He had a left superficial femoral to posterior tibial artery bypass in February 2017 he also had an atherectomy and angioplasty by Dr. [name] of the right popliteal artery in 2016. He is supposed to be getting arterial studies annually however this was interrupted last year Brailsford, Demitri A ([id number]) [phone].pdf Page 7 of 12 because of the  pandemic. He tells [place] he was at [facility] 2 weeks ago was getting out of of the scooter and traumatized his left lateral lower leg. There was a lot of bleeding as the patient is on Plavix and Eliquis. They have been dressing this with Neosporin and doing a fairly good job. Wound measures 2.5 x 3.5 it does not have any depth he does not have a wound history in his legs outside of surgery however he does have chronic edema and skin changes suggestive of chronic venous disease possibly some degree of lymphedema as well. Past medical history includes type 2 diabetes with peripheral neuropathy and gait instability, lumbar spondylosis, obstructive sleep apnea, history of a left pontine CVA, basal cell skin cancer, atrial fibrillation on anticoagulation, significant PAD as noted with a left superficial femoral to posterior tibial bypass in February 2017 and a right popliteal atherectomy and angioplasty by Dr. [name] in 30th 2016. He also has a history of coronary artery disease with an MI in 2002 hypertension hyperlipidemia and heart failure with preserved ejection fraction His last arterial studies I can see in epic were on 03/10/2018 this showed a right ABI of 0.69 and a right TBI of 0.5 with monophasic waveforms on the right. On the left his ABI was 1.20 with a TBI of 0.92 and triphasic waveforms. He has not had arterial studies since. Our nurse in the clinic got an ABI on the left of 1.1 11/2; left anterior leg wound in the setting of chronic venous insufficiency. Wound was initially trauma. We have been using Hydrofera Blue under compression he has home health.. The wound looks a lot better today with improvement in surface area 11/16; left anterior leg wound in the setting of chronic venous insufficiency. Wound was initially trauma. We have been using Hydrofera Blue under compression. The patient is closed today. He is supposed to follow-up with vein and vascular with regards to arterial insufficiency  nevertheless his leg wounds are 12/3; apparently 2 weeks ago when they were putting on their stockings they managed to get 3 wounds on the left anterior lower leg from abrasion when putting on the stockings. Home health came by the week of Thanksgiving and put Hydrofera Blue 4-layer wrap on this and there is only one superficial area remaining. The patient  and his wife complained about the difficulties getting stockings on I think we are using 20/30. We will order bilateral external compression stockings which should be easier. 12/10; wound on the left anterior lower leg is closed. He has chronic venous insufficiency we ordered him Farrow wrap stockings unfortunately he did not bring these in. 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 He says that it has been present since mid December. He is currently residing in [facility] until March and a podiatrist has been looking after him there. They have been simply painting the area with Betadine. He has not had any lower extremity arterial studies since 2019, at which time his right ABI was 0.69. Measured in clinic today, it was 0.71. He is not aware of his most recent hemoglobin A1c, but historically he has had exceptionally poor control. On the basis of his right first metatarsal, there is a small crescent shaped wound. There is surrounding eschar and callus. There is no malodor or purulent drainage. 09/08/2022: The original wound is smaller today and fairly clean, but there is some discoloration and a pulpy texture to the adjacent callus. Underneath this, the tissue is open exposing the fat layer. It looks like perhaps there was a crack in the callus and moisture got under the skin and caused breakdown. 09/15/2022: There has been more moisture related tissue breakdown. The callus is very soft and the underlying tissue is more open. 09/28/2022: The wound looks much better. He has done  a good job keeping it dry. There is some callus overlying much of the wound surface. There is slough on the exposed open areas. 10/14/2022: There has been a lot of moisture related tissue breakdown. He is still forming callus over the top and then it seems that moisture gets underneath the callus and causes tissue damage. There is slough on the exposed wound surface. [place]. 10/21/2022: His foot is less wet, but there is no significant change to his wound. He has developed a blister on his left anterior tibial surface. There is no open wound here, but he does have some fairly significant edema. We are planning to apply a total contact cast today. 10/23/2022: Here for his obligatory first cast change. He says he has not had any issues wearing the cast or walking in the boot. No detrimental effects on his wound. 10/29/2022: The wound is looking better. Clearly, putting him in the cast has prevented water from getting in under his callus and causing further tissue breakdown. The blister on his left anterior tibial surface has not yet ruptured. Edema control is significantly improved. 11/05/2022: The wound on his right plantar foot is getting better. There is still some callus accumulation around the wounds, but there are just 2 open areas now, a very small 1 on the medial aspect of his metatarsal head and a little bit larger 1 on the plantar surface. The blister on his left anterior tibial surface is now open with hanging dry skin. 11/12/2022: The left anterior tibial wound is closed. The wound on his right plantar foot is smaller with just a little bit of callus around the edges. 11/19/2022: The right plantar foot wound continues to contract. The surface is quite clean. The tiny satellite area on the medial aspect of his foot has closed. 11/26/2022: The wound is smaller again today. He is responding well to  the offloading effects of total contact  casting. 12/03/2022: The wound is about the same size today. There is a little bit of moisture around the perimeter of the wound. No significant tissue breakdown. 12/10/2022: The wound is smaller today. There has been no moisture-related tissue breakdown of any maceration. 12/21/2022: The wound continues to contract. Moisture control is excellent. 12/28/2022: The wound is a little bit smaller today. 01/05/2023: The wound measures smaller today. Excellent moisture control with zero evidence of maceration. The wound surface is clean with healthy granulation tissue. 01/13/2023: We are leaving his Apligraf in situ for a second week. There is no discolored drainage or malodor coming from the site. 01/19/2023: The wound is smaller today. The periwound skin is in good condition without tissue maceration and the healed areas of tissue appear to have improved integrity. 01/27/2023: Continued contraction of the wound. No tissue maceration. The wound surface is flush with the surrounding skin. 02/02/2023: The wound measured a little bit smaller today. There is some bruising just adjacent to the wound, but this has not resulted in any tissue breakdown. 02/09/2023: The wound is smaller again today. The area of bruising that was seen last week has resolved and there has been no tissue damage as a result. There is some callus accumulation around the wound edges along with minimal soft slough on the surface. 02/16/2023: The wound measured a little bit larger today. There is a little bit of maceration around the wound edges, which is likely responsible. Parkerson, Aerion A [id number] [id number] Page 8 of 12 02/23/2023: The wound is smaller today and there is epithelium encroaching around the edges. There is no periwound tissue maceration, nor has he accumulated any significant callus. 03/03/2023: His wound continues to contract. There is no maceration nor any significant callus. There is slight slough on the  wound surface. 03/09/2023: The wound is a little bit smaller again today. He is managing very well in the peg assist offloading insert and there has been no tissue breakdown or periwound maceration as a result of being out of the total contact cast. 03/16/2023: The wound measured smaller again today. He continues to do well using the peg assist offloading insert. 03/23/2023: Although the wound measured the same size today, it visually appears smaller to me. There is good granulation tissue with minimal slough. There is a little callus around the edges. 03/30/2023: The wound is smaller today. The granulation tissue is healthy. There is a little bit of callus accumulation around the edges. 04/06/2023: Unfortunately, he did not have any assistance to change his dressing during the week and he was also walking substantially more due to attending a family event. This has resulted in fairly significant breakdown of his wound. Much of this seems to be related to moisture accumulation. 04/13/2023: The wound looks markedly better this week after being in the total contact cast. There is no moisture-related tissue breakdown, and the wound opening is smaller. 04/20/2023: The wound is smaller again today. The periwound skin is nice and dry and there is no concern for infection. 04/27/2023: The wound continues to contract. The surface is clean. There is a little bit of periwound moisture, but the wound itself has not broken down at all. 05/04/2023: The wound is stable this week. He has had an increase in his left lower leg swelling, however, and he has an intact blister on the distal medial aspect of the leg, adjacent to where his saphenous vein was harvested. 05/11/2023: The wound is a little bit  narrower from 12-6 o'clock but slightly wider from 9-3 o'clock. It is otherwise fairly clean. The blister on his left lower leg is still present but flat. Edema is improved. 05/18/2023: The blister on his left lower leg has  resolved. The wound on his right first metatarsal head is smaller today. There is a little bit of slough, callus, and eschar present. Moisture balance is good. 05/25/2023: His first metatarsal head wound is down to just a narrow slit. No concern for infection. 06/01/2023: The wound has contracted further. There is a little bit of periwound dry skin and callus. Patient History Information obtained from Patient. Family History Unknown History. Social History Never smoker, Marital Status - Single, Alcohol Use - Never, Drug Use - No History, Caffeine Use - Rarely. Medical History Respiratory Patient has history of Sleep Apnea Cardiovascular Patient has history of Arrhythmia - A-Fib, Coronary Artery Disease - s/p CABG, Deep Vein Thrombosis, Hypertension, Myocardial Infarction, Peripheral Arterial Disease - s/p Fempop x 2 Endocrine Patient has history of Type II Diabetes Neurologic Patient has history of Neuropathy Hospitalization/Surgery History - colonoscopy. - polypectomy. - peripheral vascular cath. - shoulder arthroscopy. - carpal tunnel release. - coronary artery bypass. - appendectomy. - cardiac cath. - coronary angioplasty. - thrombectomy. - knee arthroplasty. - popliteal artery stent. 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 no acute distress. Vitals Time Taken: 9:01 AM, Height: 70 in, Weight: 260 lbs, BMI: 37.3, Temperature: 98.1 F, Pulse: 63 bpm, Respiratory Rate: 20 breaths/min, Blood Pressure: 204/72 mmHg, Capillary Blood Glucose: 169 mg/dl. General Notes: glucose per pt report this am Pepper, Larren A ([id number]) [phone].pdf Page 9 of 12 Respiratory Normal work of breathing on room air.. General Notes: 06/01/2023: The wound has contracted further. There is a little bit of periwound dry skin and  callus. Integumentary (Hair, Skin) Wound #2 status is Open. Original cause of wound was Gradually Appeared. The date acquired was: 07/08/2022. The wound has been in treatment 39 weeks. The wound is located on the Right,Plantar Foot. The wound measures 0.3cm length x 0.9cm width x 0.1cm depth; 0.212cm^2 area and 0.021cm^3 volume. There is Fat Layer (Subcutaneous Tissue) exposed. There is no tunneling or undermining noted. There is a medium amount of serosanguineous drainage noted. The wound margin is distinct with the outline attached to the wound base. There is large (67-100%) red granulation within the wound bed. There is a small (1-33%) amount of necrotic tissue within the wound bed including Eschar. The periwound skin appearance had no abnormalities noted for moisture. The periwound skin appearance had no abnormalities noted for color. The periwound skin appearance exhibited: Callus. Periwound temperature was noted as No Abnormality. Assessment Active Problems ICD-10 Non-pressure chronic ulcer of other part of right foot with fat layer exposed Localized edema Peripheral vascular disease, unspecified Atherosclerotic heart disease of native coronary artery without angina pectoris Essential (primary) hypertension Type 2 diabetes mellitus with hyperglycemia Type 2 diabetes mellitus with diabetic neuropathy, unspecified Cerebral infarction, unspecified Procedures Wound #2 Pre-procedure diagnosis of Wound #2 is a Diabetic Wound/Ulcer of the Lower Extremity located on the Right,Plantar Foot .Severity of Tissue Pre Debridement is: Fat layer exposed. There was a Excisional Skin/Subcutaneous Tissue Debridement with a total area of 0.21 sq cm performed by Duanne Guess, MD. With the following instrument(s): Curette to remove Non-Viable tissue/material. Material removed includes Eschar, Subcutaneous Tissue, and Skin: Epidermis after achieving pain control using Lidocaine 5% topical ointment.  No  specimens were taken. A time out was conducted at 09:31, prior to the start of the procedure. A Minimum amount of bleeding was controlled with Pressure. The procedure was tolerated well. Post Debridement Measurements: 0.3cm length x 0.9cm width x 0.1cm depth; 0.021cm^3 volume. Character of Wound/Ulcer Post Debridement is improved. Severity of Tissue Post Debridement is: Fat layer exposed. Post procedure Diagnosis Wound #2: Same as Pre-Procedure Pre-procedure diagnosis of Wound #2 is a Diabetic Wound/Ulcer of the Lower Extremity located on the Right,Plantar Foot . There was a T Contact Cast otal Procedure by Duanne Guess, MD. Post procedure Diagnosis Wound #2: Same as Pre-Procedure Plan Follow-up Appointments: Return Appointment in 1 week. - Dr. Lady Gary - room 2 TCC Anesthetic: (In clinic) Topical Lidocaine 5% applied to wound bed Bathing/ Shower/ Hygiene: May shower with protection but do not get wound dressing(s) wet. Protect dressing(s) with water repellant cover (for example, large plastic bag) or a cast cover and may then take shower. Off-Loading: T Contact Cast to Right Lower Extremity otal Removable cast walker boot to: - right leg Non Wound Condition: Other Non Wound Condition Orders/Instructions: - compression wrap urgo lite to left leg The following medication(s) was prescribed: lidocaine topical 5 % ointment ointment topical was prescribed at facility WOUND #2: - Foot Wound Laterality: Plantar, Right Cleanser: Soap and Water 1 x Per Week/30 Days Discharge Instructions: May shower and wash wound with dial antibacterial soap and water prior to dressing change. Cleanser: Wound Cleanser 1 x Per Week/30 Days Discharge Instructions: Cleanse the wound with wound cleanser prior to applying a clean dressing using gauze sponges, not tissue or cotton balls. Peri-Wound Care: Zinc Oxide Ointment 30g tube 1 x Per Week/30 Days Discharge Instructions: Apply Zinc Oxide to periwound with  each dressing change Topical: Gentamicin 1 x Per Week/30 Days Discharge Instructions: As directed by physician Topical: Mupirocin Ointment 1 x Per Week/30 Days Discharge Instructions: Apply Mupirocin (Bactroban) as instructed Prim Dressing: Maxorb Extra Ag+ Alginate Dressing, 2x2 (in/in) 1 x Per Week/30 Days ary Sime, Dequann A ([id number]) [id number] Page 10 of 12 Discharge Instructions: Apply to wound bed as instructed Secondary Dressing: Woven Gauze Sponge, Non-Sterile 4x4 in 1 x Per Week/30 Days Discharge Instructions: Apply over primary dressing as directed. Secondary Dressing: Zetuvit Plus 4x8 in 1 x Per Week/30 Days Discharge Instructions: Apply over primary dressing as directed. Secured With: [employer], 4.5x3.1 (in/yd) 1 x Per Week/30 Days Discharge Instructions: Secure with Kerlix as directed. Secured With: 43M Medipore H Soft Cloth Surgical T ape, 4 x 10 (in/yd) 1 x Per Week/30 Days Discharge Instructions: Secure with tape as directed. 06/01/2023: The wound has contracted further. There is a little bit of periwound dry skin and callus. I used a curette to debride slough, subcutaneous tissue, dry skin, and callus from the wound site. We will continue the mixture of topical gentamicin and mupirocin with silver alginate and a total contact cast. He will follow-up in 1 week. Electronic Signature(s) Signed: 06/01/2023 10:03:26 AM By: Duanne Guess MD FACS Entered By: Duanne Guess on 06/01/2023 07:03:25 -------------------------------------------------------------------------------- HxROS Details Patient Name: Date of Service: [name] Pian, [name] 06/01/2023 9:15 A M Medical Record Number: [id number] Patient Account Number: [id number] Date of Birth/Sex: Treating RN: 1955-09-02 (68 y.o. M) Primary Care Provider: Arva Chafe Other Clinician: Referring Provider: Treating Provider/Extender: [name] in  Treatment: 39 Information Obtained From Patient Respiratory Medical History: Positive for: Sleep Apnea Cardiovascular Medical History: Positive for: Arrhythmia - A-Fib; Coronary Artery  Disease - s/p CABG; Deep Vein Thrombosis; Hypertension; Myocardial Infarction; Peripheral Arterial Disease - s/p Fempop x 2 Past Medical History Notes: CVA x 3 Endocrine Medical History: Positive for: Type II Diabetes Time with diabetes: since 1997 Treated with: Insulin, Oral agents Blood sugar tested every day: Yes Tested : 2-3x per day Musculoskeletal Medical History: Past Medical History Notes: carpal tunnel syndrome Neurologic Medical History: Positive for: Neuropathy Past Medical History Notes: stroke Oncologic Salm, Chirstopher A ([id number]) [id number] Page 11 of 12 Medical History: Past Medical History Notes: skin cancer Immunizations Pneumococcal Vaccine: Received Pneumococcal Vaccination: Yes Received Pneumococcal Vaccination On or After 60th Birthday: Yes Implantable Devices None Hospitalization / Surgery History Type of Hospitalization/Surgery colonoscopy polypectomy peripheral vascular cath shoulder arthroscopy carpal tunnel release coronary artery bypass appendectomy cardiac cath coronary angioplasty thrombectomy knee arthroplasty popliteal artery stent Family and Social History Unknown History: Yes; Never smoker; Marital Status - Single; Alcohol Use: Never; Drug Use: No History; Caffeine Use: Rarely; Financial Concerns: No; Food, Clothing or Shelter Needs: No; Support System Lacking: No; Transportation Concerns: No Electronic Signature(s) Signed: 06/01/2023 10:34:59 AM By: Duanne Guess MD FACS Entered By: Duanne Guess on 06/01/2023 06:51:48 -------------------------------------------------------------------------------- Total Contact Cast Details Patient Name: Date of Service: [name] RK A. 06/01/2023 9:15 A M Medical Record  Number: [id number] Patient Account Number: [id number] Date of Birth/Sex: Treating RN: Jul 29, 1955 (67 y.o. [name] Primary Care Provider: Arva Chafe Other Clinician: Referring Provider: Treating Provider/Extender: [name] in Treatment: 39 T Contact Cast Applied for Wound Assessment: otal Wound #2 Right,Plantar Foot Performed By: Physician Duanne Guess, MD The following information was scribed by: Samuella Bruin The information was scribed for: Duanne Guess Post Procedure Diagnosis Same as Pre-procedure Electronic Signature(s) Signed: 06/01/2023 10:34:59 AM By: Duanne Guess MD FACS Signed: 06/01/2023 3:33:50 PM By: Samuella Bruin Entered By: Samuella Bruin on 06/01/2023 06:33:08 Navia, Ladislaus A ([id number]) [id number] Page 12 of 12 -------------------------------------------------------------------------------- SuperBill Details Patient Name: Date of Service: WERLING, [name] RK A. 06/01/2023 Medical Record Number: [id number] Patient Account Number: [id number] Date of Birth/Sex: Treating RN: April 19, 1956 (67 y.o. M) Primary Care Provider: Arva Chafe Other Clinician: Referring Provider: Treating Provider/Extender: [name] in Treatment: 39 Diagnosis Coding ICD-10 Codes Code Description [phone] Non-pressure chronic ulcer of other part of right foot with fat layer exposed R60.0 Localized edema I73.9 Peripheral vascular disease, unspecified I25.10 Atherosclerotic heart disease of native coronary artery without angina pectoris I10 Essential (primary) hypertension E11.65 Type 2 diabetes mellitus with hyperglycemia E11.40 Type 2 diabetes mellitus with diabetic neuropathy, unspecified I63.9 Cerebral infarction, unspecified Facility Procedures : CPT4 Code: [id number] Description: 11042 - DEB SUBQ TISSUE 20 SQ CM/< ICD-10 Diagnosis Description L97.512  Non-pressure chronic ulcer of other part of right foot with fat layer exposed Modifier: Quantity: 1 Physician Procedures : CPT4 Code Description Modifier [id number] - WC PHYS LEVEL 4 - EST PT ICD-10 Diagnosis Description L97.512 Non-pressure chronic ulcer of other part of right foot with fat layer exposed I73.9 Peripheral vascular disease, unspecified R60.0 Localized  edema E11.65 Type 2 diabetes mellitus with hyperglycemia Quantity: 1 : [id number] 11042 - WC PHYS SUBQ TISS 20 SQ CM ICD-10 Diagnosis Description L97.512 Non-pressure chronic ulcer of other part of right foot with fat layer exposed Quantity: 1 Electronic Signature(s) Signed: 06/01/2023 10:06:19 AM By: Duanne Guess MD FACS Entered By: Duanne Guess on 06/01/2023 07:06:19

## 2023-06-01 NOTE — Patient Instructions (Signed)
I will order a new sleep study, this one will be a HST for the patient, I don't think we can bring him into the lab in a reasonable time.    HST - ordered by watch pat device. If the  device fails to pick up heart rate may have to invite to an in lab sleep study.   I plan to follow up either personally or through our NP within 3-5 months.

## 2023-06-01 NOTE — Progress Notes (Unsigned)
Cardiology Office Note:  .   Date:  06/02/2023  ID:  [name], DOB [birth year], MRN [id number] PCP: Sharlene Dory, DO  [facility] Providers Cardiologist:  Nanetta Batty, MD    Patient Profile: .      PMH Coronary artery disease S/p CABG x 4 DVT CVA Hypertension Hyperlipidemia PVD Type 2 DM Atrial fibrillation  Referred to advanced lipid disorder clinic and seen by Dr. Rennis Golden 02/15/2019.  He had significant cardiovascular history and dyslipidemia with most recent lipid profile showing total cholesterol 231, triglycerides 406, HDL 35, and LDL could not be calculated.  3 years prior his triglycerides were markedly elevated in the 1900 range.  He was previously seeing Dr. Hulda Marin at [facility] for management of hypertriglyceridemia.  He had improvement in triglycerides possibly from better treatment of diabetes.  He was on insulin, fenofibrate 145 mg and atorvastatin 40 mg daily.  Vascepa was added.  LP(a) was negative at 9.6. PCSK9i thought to be cost-prohibitive. He was enrolled in the [place] 4 clinical trial. He reported concerns with cost of Eliquis, Jardiance and Vascepa and patient assistance paperwork was provided.        History of Present Illness: [name]   [name] is a pleasant loquacious 67 y.o. male who returns today for follow-up of dyslipidemia. He reports frustration with a non-healing wound on the right foot. The wound has been present for nine and a half months and has significantly impacted his quality of life. He can no longer ride his motorcycle and travel which is what he loves to do. Having back pain due to altered gait from the wound, which has led to a hospital visit and a prescription for muscle relaxers. He reports recent lab work but I cannot find records of recent lipid panel. Reports compliance with medications, but does not know specific details of what he takes. Reports many of his medications are expensive.  He wants [place] to complete  paperwork for Eliquis, however he does not currently meet guidelines.  Has a CPAP machine for sleep apnea, which is reportedly "dying" and needs to be replaced. It is difficult to keep him on task and get a thorough history.    Discussed the use of AI scribe software for clinical note transcription with the patient, who gave verbal consent to proceed.   ROS: See HPI       Studies Reviewed: [name]   EKG Interpretation Date/Time:  Wednesday June 02 2023 14:28:35 EST Ventricular Rate:  60 PR Interval:    QRS Duration:  76 QT Interval:  452 QTC Calculation: 452 R Axis:   133  Text Interpretation: Atrial fibrillation Left posterior fascicular block ST & T wave abnormality, consider anterolateral ischemia Confirmed by Eligha Bridegroom [phone]) on 06/02/2023 2:42:55 PM    Risk Assessment/Calculations:    CHA2DS2-VASc Score = 7   This indicates a 11.2% annual risk of stroke. The patient's score is based upon: CHF History: 1 HTN History: 1 Diabetes History: 1 Stroke History: 2 Vascular Disease History: 1 Age Score: 1 Gender Score: 0    HYPERTENSION CONTROL Vitals:   06/02/23 1425 06/02/23 1457  BP: (!) 190/66 (!) 144/72    The patient's blood pressure is elevated above target today.  In order to address the patient's elevated BP: Blood pressure will be monitored at home to determine if medication changes need to be made.          Physical Exam:   VS:  BP [name])  144/72   Pulse 60   Ht 5\' 10"  (1.778 m)   Wt 279 lb 9.6 oz (126.8 kg)   SpO2 98%   BMI 40.12 kg/m    Wt Readings from Last 3 Encounters:  06/02/23 279 lb 9.6 oz (126.8 kg)  06/01/23 260 lb (117.9 kg)  04/30/23 265 lb (120.2 kg)    GEN: Obese, well developed in no acute distress NECK: No JVD; No carotid bruits CARDIAC: Irregular RR, no murmurs, rubs, gallops RESPIRATORY:  Clear to auscultation without rales, wheezing or rhonchi  ABDOMEN: Soft, non-tender, non-distended EXTREMITIES:  RLE wrapped and splinted,  LLE with compression stockings, mild edema     ASSESSMENT AND PLAN: .    Dyslipidemia LDL goal < 55: No recent lipid panel to review. He reports compliance with medications "if you prescribed it I take it." We will get lipid panel when he can return fasting.  If LDL above goal, would recommend continuing rosuvastatin and consider PCSK9i therapy or bempedoic acid.   Hypertension: BP initially quite elevated but improved on my recheck. It is difficult to ensure compliance with current medications as he changes subjects. If BP remains elevated, consider adding amlodipine at next office visit. Will encourage him to bring medications to next office visit.   CAD: S/p CABG in 2002. He denies chest pain. Has some shortness of breath which he feels is secondary to not being as active with his foot wound. No recent ischemia evaluation. He is overdue for follow-up with general cardiology. Will have him make an appointment before he leaves.        Dispo: Soon appointment needed with general cardiology/1 year for lipid follow-up.   Signed, Eligha Bridegroom, NP-C

## 2023-06-01 NOTE — Progress Notes (Signed)
PEGGY, MOHIUDDIN A [id number]) [id number] Page 1 of 7 Visit Report for 06/01/2023 Arrival Information Details Patient Name: Date of Service: [place], [name] 06/01/2023 9:15 A M Medical Record Number: [id number] Patient Account Number: [id number] Date of Birth/Sex: Treating RN: 07/27/1955 (67 y.o. [name] Primary Care Jersee Winiarski: Arva Chafe Other Clinician: Referring Sibbie Flammia: Treating Nakai Yard/Extender: [name] in Treatment: 39 Visit Information History Since Last Visit Added or deleted any medications: No Patient Arrived: Ambulatory Any new allergies or adverse reactions: No Arrival Time: 08:58 Had a fall or experienced change in No Accompanied By: self activities of daily living that may affect Transfer Assistance: None risk of falls: Patient Identification Verified: Yes Signs or symptoms of abuse/neglect since last visito No Secondary Verification Process Completed: Yes Hospitalized since last visit: No Patient Requires Transmission-Based Precautions: No Implantable device outside of the clinic excluding No Patient Has Alerts: No cellular tissue based products placed in the center since last visit: Has Dressing in Place as Prescribed: Yes Has Footwear/Offloading in Place as Prescribed: Yes Right: T Contact Cast otal Pain Present Now: No Electronic Signature(s) Signed: 06/01/2023 4:40:40 PM By: Zenaida Deed RN, BSN Entered By: Zenaida Deed on 06/01/2023 05:59:25 -------------------------------------------------------------------------------- Encounter Discharge Information Details Patient Name: Date of Service: [name], [name] 06/01/2023 9:15 A M Medical Record Number: [id number] Patient Account Number: [id number] Date of Birth/Sex: Treating RN: 02-06-1956 (67 y.o. [name] Primary Care Rilley Stash: Arva Chafe Other Clinician: Referring Asaf Elmquist: Treating Aldyn Toon/Extender:  [name] in Treatment: 39 Encounter Discharge Information Items Post Procedure Vitals Discharge Condition: Stable Temperature (F): 98.1 Ambulatory Status: Ambulatory Pulse (bpm): 63 Discharge Destination: Home Respiratory Rate (breaths/min): 20 Transportation: Private Auto Blood Pressure (mmHg): 204/72 Accompanied By: self Schedule Follow-up Appointment: Yes Clinical Summary of Care: Patient Declined Electronic Signature(s) Signed: 06/01/2023 3:33:50 PM By: Samuella Bruin Entered By: Samuella Bruin on 06/01/2023 06:56:39 Temples, Merritt A ([id number]) [id number] Page 2 of 7 -------------------------------------------------------------------------------- Lower Extremity Assessment Details Patient Name: Date of Service: [place], [name] 06/01/2023 9:15 A M Medical Record Number: [id number] Patient Account Number: [id number] Date of Birth/Sex: Treating RN: 1955-12-02 (67 y.o. [name] Primary Care Killian Ress: Arva Chafe Other Clinician: Referring Naydelin Ziegler: Treating Bayard More/Extender: [name] in Treatment: 39 Edema Assessment Assessed: [Left: No] [Right: No] Edema: [Left: Ye] [Right: s] Calf Left: Right: Point of Measurement: From Medial Instep 38.5 cm Ankle Left: Right: Point of Measurement: From Medial Instep 28 cm Vascular Assessment Pulses: Dorsalis Pedis Palpable: [Right:No] Extremity colors, hair growth, and conditions: Extremity Color: [Right:Normal] Hair Growth on Extremity: [Right:Yes] Temperature of Extremity: [Right:Warm] Capillary Refill: [Right:< 3 seconds] Dependent Rubor: [Right:No No] Electronic Signature(s) Signed: 06/01/2023 4:40:40 PM By: Zenaida Deed RN, BSN Entered By: Zenaida Deed on 06/01/2023 06:08:39 -------------------------------------------------------------------------------- Multi Wound Chart Details Patient Name: Date of  Service: [name], [name] 06/01/2023 9:15 A M Medical Record Number: [id number] Patient Account Number: [id number] Date of Birth/Sex: Treating RN: 1956/02/13 (67 y.o. M) Primary Care Shelden Raborn: Arva Chafe Other Clinician: Referring Hanya Guerin: Treating Daryl Quiros/Extender: [name] in Treatment: 39 Vital Signs Height(in): 70 Capillary Blood Glucose(mg/dl): 623 Weight(lbs): 762 Pulse(bpm): 63 Body Mass Index(BMI): 37.3 Blood Pressure(mmHg): 204/72 Temperature(F): 98.1 Respiratory Rate(breaths/min): 20 [2:Photos:] [N/A:N/A] Right, Plantar Foot N/A N/A Wound Location: Gradually Appeared N/A N/A Wounding Event: Diabetic Wound/Ulcer of the Lower N/A N/A Primary Etiology: Extremity Sleep Apnea, Arrhythmia, Coronary N/A N/A Comorbid History: Artery Disease, Deep Vein Thrombosis, Hypertension, Myocardial Infarction, Peripheral Arterial  Disease, Type II Diabetes, Neuropathy 07/08/2022 N/A N/A Date Acquired: 63 N/A N/A Weeks of Treatment: Open N/A N/A Wound Status: No N/A N/A Wound Recurrence: 0.3x0.9x0.1 N/A N/A Measurements L x W x D (cm) 0.212 N/A N/A A (cm) : rea 0.021 N/A N/A Volume (cm) : 91.70% N/A N/A % Reduction in A rea: 91.80% N/A N/A % Reduction in Volume: Grade 1 N/A N/A Classification: Medium N/A N/A Exudate A mount: Serosanguineous N/A N/A Exudate Type: red, brown N/A N/A Exudate Color: Distinct, outline attached N/A N/A Wound Margin: Large (67-100%) N/A N/A Granulation A mount: Red N/A N/A Granulation Quality: Small (1-33%) N/A N/A Necrotic A mount: Eschar N/A N/A Necrotic Tissue: Fat Layer (Subcutaneous Tissue): Yes N/A N/A Exposed Structures: Fascia: No Tendon: No Muscle: No Joint: No Bone: No Small (1-33%) N/A N/A Epithelialization: Debridement - Excisional N/A N/A Debridement: Pre-procedure Verification/Time Out 09:31 N/A N/A Taken: Lidocaine 5% topical ointment N/A N/A Pain  Control: Necrotic/Eschar, Subcutaneous N/A N/A Tissue Debrided: Skin/Subcutaneous Tissue N/A N/A Level: 0.21 N/A N/A Debridement A (sq cm): rea Curette N/A N/A Instrument: Minimum N/A N/A Bleeding: Pressure N/A N/A Hemostasis A chieved: Procedure was tolerated well N/A N/A Debridement Treatment Response: 0.3x0.9x0.1 N/A N/A Post Debridement Measurements L x W x D (cm) 0.021 N/A N/A Post Debridement Volume: (cm) Callus: Yes N/A N/A Periwound Skin Texture: Maceration: Yes N/A N/A Periwound Skin Moisture: Dry/Scaly: No No Abnormalities Noted N/A N/A Periwound Skin Color: No Abnormality N/A N/A Temperature: Debridement N/A N/A Procedures Performed: T Contact Cast otal Treatment Notes Electronic Signature(s) Signed: 06/01/2023 9:50:11 AM By: Duanne Guess MD FACS Entered By: Duanne Guess on 06/01/2023 06:50:10 -------------------------------------------------------------------------------- Multi-Disciplinary Care Plan Details Patient Name: Date of Service: [name], [name] 06/01/2023 9:15 A M Medical Record Number: [id number] Patient Account Number: [id number] Date of Birth/Sex: Treating RN: Sep 16, 1955 (67 y.o. [name] Primary Care Relena Ivancic: Arva Chafe Other Clinician: Coralie Carpen A ([id number]) [id number] Page 4 of 7 Referring Icker Swigert: Treating Jaleigha Deane/Extender: [name] in Treatment: 39 Multidisciplinary Care Plan reviewed with physician Active Inactive Abuse / Safety / Falls / Self Care Management Nursing Diagnoses: Impaired physical mobility Potential for falls Goals: Patient will not experience any injury related to falls Date Initiated: 08/31/2022 Target Resolution Date: 06/18/2023 Goal Status: Active Patient/caregiver will verbalize/demonstrate measures taken to improve the patient's personal safety Date Initiated: 08/31/2022 Target Resolution Date: 06/18/2023 Goal  Status: Active Interventions: Provide education on basic hygiene Provide education on fall prevention Notes: Wound/Skin Impairment Nursing Diagnoses: Impaired tissue integrity Knowledge deficit related to ulceration/compromised skin integrity Goals: Patient/caregiver will verbalize understanding of skin care regimen Date Initiated: 08/31/2022 Target Resolution Date: 06/18/2023 Goal Status: Active Interventions: Assess patient/caregiver ability to obtain necessary supplies Assess patient/caregiver ability to perform ulcer/skin care regimen upon admission and as needed Assess ulceration(s) every visit Treatment Activities: Skin care regimen initiated : 08/31/2022 Topical wound management initiated : 08/31/2022 Notes: Electronic Signature(s) Signed: 06/01/2023 3:33:50 PM By: Samuella Bruin Entered By: Samuella Bruin on 06/01/2023 06:33:20 -------------------------------------------------------------------------------- Pain Assessment Details Patient Name: Date of Service: [name], [name] 06/01/2023 9:15 A M Medical Record Number: [id number] Patient Account Number: [id number] Date of Birth/Sex: Treating RN: 1956/07/05 (67 y.o. [name] Primary Care Lenvil Swaim: Arva Chafe Other Clinician: Referring Letia Guidry: Treating Alnisa Hasley/Extender: [name] in Treatment: 39 Active Problems Location of Pain Severity and Description of Pain Patient Has Paino No Paulsen, Nicholous A ([id number]) [id number] Page 5 of 7 Patient Has Paino No Site Locations Rate  the pain. Current Pain Level: 0 Pain Management and Medication Current Pain Management: Electronic Signature(s) Signed: 06/01/2023 4:40:40 PM By: Zenaida Deed RN, BSN Entered By: Zenaida Deed on 06/01/2023 06:02:20 -------------------------------------------------------------------------------- Patient/Caregiver Education Details Patient Name: Date of  Service: [name] RK A. 11/12/2024andnbsp9:15 A M Medical Record Number: [id number] Patient Account Number: [id number] Date of Birth/Gender: Treating RN: 1955/08/13 (67 y.o. [name] Primary Care Physician: Arva Chafe Other Clinician: Referring Physician: Treating Physician/Extender: [name] in Treatment: 39 Education Assessment Education Provided To: Patient Education Topics Provided Offloading: Methods: Explain/Verbal Responses: Reinforcements needed, State content correctly Electronic Signature(s) Signed: 06/01/2023 3:33:50 PM By: Samuella Bruin Entered By: Samuella Bruin on 06/01/2023 06:33:44 -------------------------------------------------------------------------------- Wound Assessment Details Patient Name: Date of Service: [name], [name] 06/01/2023 9:15 A M Medical Record Number: [id number] Patient Account Number: [id number] Date of Birth/Sex: Treating RN: Oct 27, 1955 (67 y.o. Eilan, Matsunaga, Kendell A ([id number]) [id number] Page 6 of 7 Primary Care Cristan Hout: Arva Chafe Other Clinician: Referring Axel Frisk: Treating Jasiah Elsen/Extender: [name] in Treatment: 39 Wound Status Wound Number: 2 Primary Diabetic Wound/Ulcer of the Lower Extremity Etiology: Wound Location: Right, Plantar Foot Wound Open Wounding Event: Gradually Appeared Status: Date Acquired: 07/08/2022 Comorbid Sleep Apnea, Arrhythmia, Coronary Artery Disease, Deep Vein Weeks Of Treatment: 39 History: Thrombosis, Hypertension, Myocardial Infarction, Peripheral Arterial Clustered Wound: No Disease, Type II Diabetes, Neuropathy Photos Wound Measurements Length: (cm) 0.3 Width: (cm) 0.9 Depth: (cm) 0.1 Area: (cm) 0.212 Volume: (cm) 0.021 % Reduction in Area: 91.7% % Reduction in Volume: 91.8% Epithelialization: Small (1-33%) Tunneling: No Undermining: No Wound  Description Classification: Grade 1 Wound Margin: Distinct, outline attached Exudate Amount: Medium Exudate Type: Serosanguineous Exudate Color: red, brown Foul Odor After Cleansing: No Slough/Fibrino Yes Wound Bed Granulation Amount: Large (67-100%) Exposed Structure Granulation Quality: Red Fascia Exposed: No Necrotic Amount: Small (1-33%) Fat Layer (Subcutaneous Tissue) Exposed: Yes Necrotic Quality: Eschar Tendon Exposed: No Muscle Exposed: No Joint Exposed: No Bone Exposed: No Periwound Skin Texture Texture Color No Abnormalities Noted: No No Abnormalities Noted: Yes Callus: Yes Temperature / Pain Temperature: No Abnormality Moisture No Abnormalities Noted: Yes Treatment Notes Wound #2 (Foot) Wound Laterality: Plantar, Right Cleanser Soap and Water Discharge Instruction: May shower and wash wound with dial antibacterial soap and water prior to dressing change. Wound Cleanser Discharge Instruction: Cleanse the wound with wound cleanser prior to applying a clean dressing using gauze sponges, not tissue or cotton balls. Peri-Wound Care Zinc Oxide Ointment 30g tube Discharge Instruction: Apply Zinc Oxide to periwound with each dressing change Topical Gentamicin Cease, Wadie A ([id number]) [id number] Page 7 of 7 Discharge Instruction: As directed by physician Mupirocin Ointment Discharge Instruction: Apply Mupirocin (Bactroban) as instructed Primary Dressing Maxorb Extra Ag+ Alginate Dressing, 2x2 (in/in) Discharge Instruction: Apply to wound bed as instructed Secondary Dressing Woven Gauze Sponge, Non-Sterile 4x4 in Discharge Instruction: Apply over primary dressing as directed. Zetuvit Plus 4x8 in Discharge Instruction: Apply over primary dressing as directed. Secured With [employer], 4.5x3.1 (in/yd) Discharge Instruction: Secure with Kerlix as directed. 47M Medipore H Soft Cloth Surgical T ape, 4 x 10 (in/yd) Discharge  Instruction: Secure with tape as directed. Compression Wrap Compression Stockings Add-Ons Electronic Signature(s) Signed: 06/01/2023 4:40:40 PM By: Zenaida Deed RN, BSN Entered By: Zenaida Deed on 06/01/2023 06:19:51 -------------------------------------------------------------------------------- Vitals Details Patient Name: Date of Service: [name], [name] 06/01/2023 9:15 A M Medical Record Number: [id number] Patient Account Number: [id number] Date of Birth/Sex: Treating RN: 11-May-1956 (66 y.o.  [name] Primary Care Ameen Mostafa: Arva Chafe Other Clinician: Referring Danetta Prom: Treating [employer]/Extender: [name] in Treatment: 39 Vital Signs Time Taken: 09:01 Temperature (F): 98.1 Height (in): 70 Pulse (bpm): 63 Weight (lbs): 260 Respiratory Rate (breaths/min): 20 Body Mass Index (BMI): 37.3 Blood Pressure (mmHg): 204/72 Capillary Blood Glucose (mg/dl): 295 Reference Range: 80 - 120 mg / dl Notes glucose per pt report this am Electronic Signature(s) Signed: 06/01/2023 4:40:40 PM By: Zenaida Deed RN, BSN Entered By: Zenaida Deed on 06/01/2023 06:01:45
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
## 2023-06-02 ENCOUNTER — Ambulatory Visit (HOSPITAL_BASED_OUTPATIENT_CLINIC_OR_DEPARTMENT_OTHER): Payer: Medicare HMO | Admitting: Nurse Practitioner

## 2023-06-02 ENCOUNTER — Encounter (HOSPITAL_BASED_OUTPATIENT_CLINIC_OR_DEPARTMENT_OTHER): Payer: Self-pay | Admitting: Nurse Practitioner

## 2023-06-02 VITALS — BP 144/72 | HR 60 | Ht 70.0 in | Wt 279.6 lb

## 2023-06-02 DIAGNOSIS — I10 Essential (primary) hypertension: Secondary | ICD-10-CM | POA: Diagnosis not present

## 2023-06-02 DIAGNOSIS — I739 Peripheral vascular disease, unspecified: Secondary | ICD-10-CM

## 2023-06-02 DIAGNOSIS — I2510 Atherosclerotic heart disease of native coronary artery without angina pectoris: Secondary | ICD-10-CM

## 2023-06-02 NOTE — Patient Instructions (Signed)
Medication Instructions:   Your physician recommends that you continue on your current medications as directed. Please refer to the Current Medication list given to you today.   *If you need a refill on your cardiac medications before your next appointment, please call your pharmacy*   Lab Work:  None ordered.  If you have labs (blood work) drawn today and your tests are completely normal, you will receive your results only by: MyChart Message (if you have MyChart) OR A paper copy in the mail If you have any lab test that is abnormal or we need to change your treatment, we will call you to review the results.   Testing/Procedures:  None ordered.   Follow-Up: At [facility], you and your health needs are our priority.  As part of our continuing mission to provide you with exceptional heart care, we have created designated Provider Care Teams.  These Care Teams include your primary Cardiologist (physician) and Advanced Practice Providers (APPs -  Physician Assistants and Nurse Practitioners) who all work together to provide you with the care you need, when you need it.  We recommend signing up for the patient portal called "MyChart".  Sign up information is provided on this After Visit Summary.  MyChart is used to connect with patients for Virtual Visits (Telemedicine).  Patients are able to view lab/test results, encounter notes, upcoming appointments, etc.  Non-urgent messages can be sent to your provider as well.   To learn more about what you can do with MyChart, go to [email or web address].    Your next appointment:   1 year(s)  Provider:   K. Italy Hilty, MD or Lebron Conners   Other Instructions  Your physician wants you to follow-up in: 1 year.  You will receive a reminder letter in the mail two months in advance. If you don't receive a letter, please call our office to schedule the follow-up appointment.

## 2023-06-08 ENCOUNTER — Encounter (HOSPITAL_BASED_OUTPATIENT_CLINIC_OR_DEPARTMENT_OTHER): Payer: Medicare HMO | Admitting: General Surgery

## 2023-06-08 ENCOUNTER — Other Ambulatory Visit: Payer: Self-pay | Admitting: Internal Medicine

## 2023-06-08 DIAGNOSIS — Z951 Presence of aortocoronary bypass graft: Secondary | ICD-10-CM | POA: Diagnosis not present

## 2023-06-08 DIAGNOSIS — I5032 Chronic diastolic (congestive) heart failure: Secondary | ICD-10-CM | POA: Diagnosis not present

## 2023-06-08 DIAGNOSIS — L97512 Non-pressure chronic ulcer of other part of right foot with fat layer exposed: Secondary | ICD-10-CM | POA: Diagnosis not present

## 2023-06-08 DIAGNOSIS — G4733 Obstructive sleep apnea (adult) (pediatric): Secondary | ICD-10-CM | POA: Diagnosis not present

## 2023-06-08 DIAGNOSIS — I872 Venous insufficiency (chronic) (peripheral): Secondary | ICD-10-CM | POA: Diagnosis not present

## 2023-06-08 DIAGNOSIS — I4891 Unspecified atrial fibrillation: Secondary | ICD-10-CM | POA: Diagnosis not present

## 2023-06-08 DIAGNOSIS — I2510 Atherosclerotic heart disease of native coronary artery without angina pectoris: Secondary | ICD-10-CM | POA: Diagnosis not present

## 2023-06-08 DIAGNOSIS — I110 Hypertensive heart disease with heart failure: Secondary | ICD-10-CM | POA: Diagnosis not present

## 2023-06-08 DIAGNOSIS — Z7901 Long term (current) use of anticoagulants: Secondary | ICD-10-CM | POA: Diagnosis not present

## 2023-06-08 DIAGNOSIS — Z7902 Long term (current) use of antithrombotics/antiplatelets: Secondary | ICD-10-CM | POA: Diagnosis not present

## 2023-06-08 DIAGNOSIS — E1142 Type 2 diabetes mellitus with diabetic polyneuropathy: Secondary | ICD-10-CM | POA: Diagnosis not present

## 2023-06-08 DIAGNOSIS — E11621 Type 2 diabetes mellitus with foot ulcer: Secondary | ICD-10-CM | POA: Diagnosis not present

## 2023-06-08 DIAGNOSIS — E1151 Type 2 diabetes mellitus with diabetic peripheral angiopathy without gangrene: Secondary | ICD-10-CM | POA: Diagnosis not present

## 2023-06-08 DIAGNOSIS — I252 Old myocardial infarction: Secondary | ICD-10-CM | POA: Diagnosis not present

## 2023-06-08 NOTE — Progress Notes (Signed)
Herzberg, [name] [name] ([id number]) [id number] Page 1 of 12 Visit Report for 06/08/2023 Chief Complaint Document Details Patient Name: Date of Service: [name] [name], [name] [name]. 06/08/2023 9:15 [name] Medical Record Number: [id number] Patient Account Number: [id number] Date of Birth/Sex: Treating RN: 12/04/1955 (67 y.o. M) Primary Care Provider: Arva Chafe Other Clinician: Referring Provider: Treating Provider/Extender: [name] in Treatment: 40 Information Obtained from: Patient Chief Complaint 05/14/2020; patient is here for review of an abrasion injury on the left lateral calf 08/31/2022: [name] (1st metatarsal base, plantar) Electronic Signature(s) Signed: 06/08/2023 10:14:18 AM By: Duanne Guess MD FACS Entered By: Duanne Guess on 06/08/2023 07:14:18 -------------------------------------------------------------------------------- Debridement Details Patient Name: Date of Service: [name] [name], [name] [name]. 06/08/2023 9:15 [name] Medical Record Number: [id number] Patient Account Number: [id number] Date of Birth/Sex: Treating RN: [birth year] (67 y.o. [name] [name] Primary Care Provider: Arva Chafe Other Clinician: Referring Provider: Treating Provider/Extender: [name] in Treatment: 40 Debridement Performed for Assessment: Wound #2 Right,Plantar Foot Performed By: Physician Duanne Guess, MD The following information was scribed by: Samuella Bruin The information was scribed for: Duanne Guess Debridement Type: Debridement Severity of Tissue Pre Debridement: Fat layer exposed Level of Consciousness (Pre-procedure): Awake and Alert Pre-procedure Verification/Time Out Yes - 09:32 Taken: Start Time: 09:32 Pain Control: Lidocaine 5% topical ointment Percent of Wound Bed Debrided: 100% T Area Debrided (cm): otal 0.03 Tissue and other material debrided: Non-Viable, Callus,  Eschar, Slough, Subcutaneous, Slough Level: Skin/Subcutaneous Tissue Debridement Description: Excisional Instrument: Curette Bleeding: Minimum Hemostasis Achieved: Pressure Response to Treatment: Procedure was tolerated well Level of Consciousness (Post- Awake and Alert procedure): Post Debridement Measurements of Total Wound Length: (cm) 0.2 Width: (cm) 0.2 Depth: (cm) 0.1 Volume: (cm) 0.003 [name] [name], [name] [name] ([id number]) [id number] Page 2 of 12 Character of Wound/Ulcer Post Debridement: Improved Severity of Tissue Post Debridement: Fat layer exposed Post Procedure Diagnosis Same as Pre-procedure Electronic Signature(s) Signed: 06/08/2023 10:29:35 AM By: Duanne Guess MD FACS Signed: 06/08/2023 3:29:21 PM By: Samuella Bruin Entered By: Samuella Bruin on 06/08/2023 06:35:17 -------------------------------------------------------------------------------- HPI Details Patient Name: Date of Service: [name] [name], [name] [name]. 06/08/2023 9:15 [name] Medical Record Number: [id number] Patient Account Number: [id number] Date of Birth/Sex: Treating RN: [birth year] (67 y.o. M) Primary Care Provider: Arva Chafe Other Clinician: Referring Provider: Treating Provider/Extender: [name] in Treatment: 40 History of Present Illness HPI Description: ADMISSION 05/14/2020; this is [name] with multiple medical issues. Predominantly he has type 2 diabetes with [name] and also history of fairly significant PAD. He had [name] in February 2017 he also had an atherectomy and angioplasty by Dr. [name] of the right popliteal artery in 2016. He is supposed to be getting arterial studies annually however this was interrupted last year because of the pandemic. He tells [place] he was at [facility] 2 weeks ago was getting out of of the scooter and traumatized his left  lateral lower leg. There was [name] as the patient is on Plavix and Eliquis. They have been dressing this with Neosporin and doing [name] fairly good job. Wound measures 2.5 x 3.5 it does not have any depth he does not have [name] in his legs outside of surgery however he does have chronic edema and skin changes suggestive of chronic venous disease possibly some degree of lymphedema as well. Past medical history includes type 2 diabetes with peripheral neuropathy and  gait instability, lumbar spondylosis, obstructive sleep apnea, history of [name], basal cell skin cancer, atrial fibrillation on anticoagulation, significant PAD as noted with [name] and [name] in 30th 2016. He also has [name] of coronary artery disease with an MI in 2002 hypertension hyperlipidemia and heart failure with preserved ejection fraction His last arterial studies I can see in epic were on 03/10/2018 this showed [name] and [name] with monophasic waveforms on the right. On the left his ABI was 1.20 with [name] and triphasic waveforms. He has not had arterial studies since. Our nurse in the clinic got an ABI on the left of 1.1 11/2; left anterior leg wound in the setting of chronic venous insufficiency. Wound was initially trauma. We have been using Hydrofera Blue under compression he has home health.. The wound looks [name] lot better today with improvement in surface area 11/16; left anterior leg wound in the setting of chronic venous insufficiency. Wound was initially trauma. We have been using Hydrofera Blue under compression. The patient is closed today. He is supposed to follow-up with vein and vascular with regards to arterial insufficiency nevertheless his leg wounds are 12/3; apparently 2 weeks ago when they were putting on their stockings they managed  to get 3 wounds on the left anterior lower leg from abrasion when putting on the stockings. Home health came by the week of Thanksgiving and put Hydrofera Blue 4-layer wrap on this and there is only one superficial area remaining. The patient and his wife complained about the difficulties getting stockings on I think we are using 20/30. We will order bilateral external compression stockings which should be easier. 12/10; wound on the left anterior lower leg is closed. He has chronic venous insufficiency we ordered him Farrow wrap stockings unfortunately he did not bring these in. 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 He says that it has been present since mid December. He is currently residing in [facility] until March and [name] podiatrist has been looking after him there. They have been simply painting the area with Betadine. He has not had any lower extremity arterial studies since 2019, at which time his right ABI was 0.69. Measured in clinic today, it was 0.71. He is not aware of his most recent hemoglobin A1c, but historically he has had exceptionally poor control. On the basis of his right first metatarsal, there is [name] small crescent shaped wound. There is surrounding eschar and callus. There is no malodor or purulent drainage. 09/08/2022: The original wound is smaller today and fairly clean, but there is some discoloration and [name] pulpy texture to the adjacent callus. Underneath this, the tissue is open exposing the fat layer. It looks like perhaps there was [name] crack in the callus and moisture got under the skin and caused breakdown. 09/15/2022: There has been more moisture related tissue breakdown. The callus is very soft and the underlying tissue is more open. 09/28/2022: The wound looks much better. He has done [name] good job keeping it dry. There is some callus overlying much of the wound surface. There is slough on the exposed  open areas. 10/14/2022: There has been [name] lot of moisture related tissue breakdown. He is still forming callus over the top and then it seems that  moisture gets underneath the callus and causes tissue damage. There is slough on the exposed wound surface. [place]. [name] [name], [name] [name] ([id number]) [id number] Page 3 of 12 10/21/2022: His foot is less wet, but there is no significant change to his wound. He has developed [name] blister on his left anterior tibial surface. There is no open wound here, but he does have some fairly significant edema. We are planning to apply [name] total contact cast today. 10/23/2022: Here for his obligatory first cast change. He says he has not had any issues wearing the cast or walking in the boot. No detrimental effects on his wound. 10/29/2022: The wound is looking better. Clearly, putting him in the cast has prevented water from getting in under his callus and causing further tissue breakdown. The blister on his left anterior tibial surface has not yet ruptured. Edema control is significantly improved. 11/05/2022: The wound on his right plantar foot is getting better. There is still some callus accumulation around the wounds, but there are just 2 open areas now, [name] very small 1 on the medial aspect of his metatarsal head and [name] little bit larger 1 on the plantar surface. The blister on his left anterior tibial surface is now open with hanging dry skin. 11/12/2022: The left anterior tibial wound is closed. The wound on his right plantar foot is smaller with just [name] little bit of callus around the edges. 11/19/2022: The right plantar foot wound continues to contract. The surface is quite clean. The tiny satellite area on the medial aspect of his foot has closed. 11/26/2022: The wound is smaller again today. He is responding well to the offloading effects of total contact casting. 12/03/2022: The wound is about the  same size today. There is [name] little bit of moisture around the perimeter of the wound. No significant tissue breakdown. 12/10/2022: The wound is smaller today. There has been no moisture-related tissue breakdown of any maceration. 12/21/2022: The wound continues to contract. Moisture control is excellent. 12/28/2022: The wound is [name] little bit smaller today. 01/05/2023: The wound measures smaller today. Excellent moisture control with zero evidence of maceration. The wound surface is clean with healthy granulation tissue. 01/13/2023: We are leaving his Apligraf in situ for [name] second week. There is no discolored drainage or malodor coming from the site. 01/19/2023: The wound is smaller today. The periwound skin is in good condition without tissue maceration and the healed areas of tissue appear to have improved integrity. 01/27/2023: Continued contraction of the wound. No tissue maceration. The wound surface is flush with the surrounding skin. 02/02/2023: The wound measured [name] little bit smaller today. There is some bruising just adjacent to the wound, but this has not resulted in any tissue breakdown. 02/09/2023: The wound is smaller again today. The area of bruising that was seen last week has resolved and there has been no tissue damage as [name] result. There is some callus accumulation around the wound edges along with minimal soft slough on the surface. 02/16/2023: The wound measured [name] little bit larger today. There is [name] little bit of maceration around the wound edges, which is likely responsible. 02/23/2023: The wound is smaller today and there is epithelium encroaching around the edges. There is no periwound tissue maceration, nor has he accumulated any significant callus. 03/03/2023: His wound continues to contract. There is no maceration nor any significant callus. There is slight slough on the wound surface. 03/09/2023:  The wound is [name] little bit smaller again today. He is managing very well in the peg assist  offloading insert and there has been no tissue breakdown or periwound maceration as [name] result of being out of the total contact cast. 03/16/2023: The wound measured smaller again today. He continues to do well using the peg assist offloading insert. 03/23/2023: Although the wound measured the same size today, it visually appears smaller to me. There is good granulation tissue with minimal slough. There is [name] little callus around the edges. 03/30/2023: The wound is smaller today. The granulation tissue is healthy. There is [name] little bit of callus accumulation around the edges. 04/06/2023: Unfortunately, he did not have any assistance to change his dressing during the week and he was also walking substantially more due to attending [name] family event. This has resulted in fairly significant breakdown of his wound. Much of this seems to be related to moisture accumulation. 04/13/2023: The wound looks markedly better this week after being in the total contact cast. There is no moisture-related tissue breakdown, and the wound opening is smaller. 04/20/2023: The wound is smaller again today. The periwound skin is nice and dry and there is no concern for infection. 04/27/2023: The wound continues to contract. The surface is clean. There is [name] little bit of periwound moisture, but the wound itself has not broken down at all. 05/04/2023: The wound is stable this week. He has had an increase in his left lower leg swelling, however, and he has an intact blister on the distal medial aspect of the leg, adjacent to where his saphenous vein was harvested. 05/11/2023: The wound is [name] little bit narrower from 12-6 o'clock but slightly wider from 9-3 o'clock. It is otherwise fairly clean. The blister on his left lower leg is still present but flat. Edema is improved. 05/18/2023: The blister on his left lower leg has resolved. The wound on his right first metatarsal head is smaller today. There is [name] little bit of slough, callus,  and eschar present. Moisture balance is good. 05/25/2023: His first metatarsal head wound is down to just [name] narrow slit. No concern for infection. 06/01/2023: The wound has contracted further. There is [name] little bit of periwound dry skin and callus. 06/08/2023: The wound is down to just [name] couple of millimeters. There is minimal dry skin and callus around the edges. No tissue maceration. Electronic Signature(s) Signed: 06/08/2023 10:14:54 AM By: Duanne Guess MD FACS Entered By: Duanne Guess on 06/08/2023 07:14:54 [name] [name], [name] [name] ([id number]) [id number] Page 4 of 12 -------------------------------------------------------------------------------- Physical Exam Details Patient Name: Date of Service: [name] [name], [name] [name]. 06/08/2023 9:15 [name] Medical Record Number: [id number] Patient Account Number: [id number] Date of Birth/Sex: Treating RN: 10/15/1955 (67 y.o. M) Primary Care Provider: Arva Chafe Other Clinician: Referring Provider: Treating Provider/Extender: [name] in Treatment: 40 Constitutional Hypertensive, asymptomatic. Bradycardic, asymptomatic. . . no acute distress. Respiratory Normal work of breathing on room air.. Notes 06/08/2023: The wound is down to just [name] couple of millimeters. There is minimal dry skin and callus around the edges. No tissue maceration. Electronic Signature(s) Signed: 06/08/2023 10:15:28 AM By: Duanne Guess MD FACS Entered By: Duanne Guess on 06/08/2023 07:15:27 -------------------------------------------------------------------------------- Physician Orders Details Patient Name: Date of Service: [name] [name], [name] [name]. 06/08/2023 9:15 [name] Medical Record Number: [id number] Patient Account Number: [id number] Date of Birth/Sex: Treating RN: 1956-07-17 (67 y.o. [name] [name] Primary Care Provider: Arva Chafe Other Clinician: Referring Provider: Treating Provider/Extender:  Lady Gary  Ruby Cola, Janyth Pupa Weeks in Treatment: 40 The following information was scribed by: Samuella Bruin The information was scribed for: Duanne Guess Verbal / Phone Orders: No Diagnosis Coding ICD-10 Coding Code Description L97.512 Non-pressure chronic ulcer of other part of right foot with fat layer exposed E11.621 Type 2 diabetes mellitus with foot ulcer I73.9 Peripheral vascular disease, unspecified R60.0 Localized edema I25.10 Atherosclerotic heart disease of native coronary artery without angina pectoris I10 Essential (primary) hypertension E11.65 Type 2 diabetes mellitus with hyperglycemia E11.40 Type 2 diabetes mellitus with diabetic neuropathy, unspecified I63.9 Cerebral infarction, unspecified Follow-up Appointments ppointment in 1 week. - Dr. Lady Gary - room 2 TCC Return [name] Anesthetic (In clinic) Topical Lidocaine 5% applied to wound bed Bathing/ Shower/ Hygiene May shower with protection but do not get wound dressing(s) wet. Protect dressing(s) with water repellant cover (for example, large plastic bag) or [name] cast cover and may then take shower. Peet, Keithan [name] ([id number]) [id number] Page 5 of 12 Off-Loading Total Contact Cast to Right Lower Extremity Removable cast walker boot to: - right leg Non Wound Condition Left Lower Extremity Other Non Wound Condition Orders/Instructions: - compression wrap urgo lite to left leg Wound Treatment Wound #2 - Foot Wound Laterality: Plantar, Right Cleanser: Soap and Water 1 x Per Week/30 Days Discharge Instructions: May shower and wash wound with dial antibacterial soap and water prior to dressing change. Cleanser: Wound Cleanser 1 x Per Week/30 Days Discharge Instructions: Cleanse the wound with wound cleanser prior to applying [name] clean dressing using gauze sponges, not tissue or cotton balls. Peri-Wound Care: Zinc Oxide Ointment 30g tube 1 x Per Week/30 Days Discharge Instructions: Apply  Zinc Oxide to periwound with each dressing change Topical: Gentamicin 1 x Per Week/30 Days Discharge Instructions: As directed by physician Topical: Mupirocin Ointment 1 x Per Week/30 Days Discharge Instructions: Apply Mupirocin (Bactroban) as instructed Prim Dressing: Maxorb Extra Ag+ Alginate Dressing, 2x2 (in/in) 1 x Per Week/30 Days ary Discharge Instructions: Apply to wound bed as instructed Secondary Dressing: Woven Gauze Sponge, Non-Sterile 4x4 in 1 x Per Week/30 Days Discharge Instructions: Apply over primary dressing as directed. Secondary Dressing: Zetuvit Plus 4x8 in 1 x Per Week/30 Days Discharge Instructions: Apply over primary dressing as directed. Secured With: [employer], 4.5x3.1 (in/yd) 1 x Per Week/30 Days Discharge Instructions: Secure with Kerlix as directed. Secured With: 7M Medipore H Soft Cloth Surgical T ape, 4 x 10 (in/yd) 1 x Per Week/30 Days Discharge Instructions: Secure with tape as directed. Patient Medications llergies: No Known Drug Allergies [name] Notifications Medication Indication Start End 06/08/2023 lidocaine DOSE topical 4 % cream - cream topical Electronic Signature(s) Signed: 06/08/2023 10:29:35 AM By: Duanne Guess MD FACS Entered By: Duanne Guess on 06/08/2023 07:15:44 -------------------------------------------------------------------------------- Problem List Details Patient Name: Date of Service: [name] [name], [name] [name]. 06/08/2023 9:15 [name] Medical Record Number: [id number] Patient Account Number: [id number] Date of Birth/Sex: Treating RN: 1956-01-07 (67 y.o. M) Primary Care Provider: Arva Chafe Other Clinician: Referring Provider: Treating Provider/Extender: [name] in Treatment: 40 Active Problems ICD-10 Encounter [name] [name], [name] [name] ([id number]) [id number] Page 6 of 12 Encounter Code Description Active Date MDM Diagnosis L97.512 Non-pressure chronic ulcer of  other part of right foot with fat layer exposed 08/31/2022 No Yes E11.621 Type 2 diabetes mellitus with foot ulcer 08/31/2022 No Yes I73.9 Peripheral vascular disease, unspecified 08/31/2022 No Yes R60.0 Localized edema 05/04/2023 No Yes I25.10 Atherosclerotic heart disease of native coronary artery without angina pectoris 08/31/2022 No Yes I10 Essential (  primary) hypertension 08/31/2022 No Yes E11.65 Type 2 diabetes mellitus with hyperglycemia 08/31/2022 No Yes E11.40 Type 2 diabetes mellitus with diabetic neuropathy, unspecified 08/31/2022 No Yes I63.9 Cerebral infarction, unspecified 08/31/2022 No Yes Inactive Problems Resolved Problems ICD-10 Code Description Active Date Resolved Date L97.821 Non-pressure chronic ulcer of other part of left lower leg limited to breakdown of skin 11/05/2022 11/05/2022 Electronic Signature(s) Signed: 06/08/2023 10:13:57 AM By: Duanne Guess MD FACS Entered By: Duanne Guess on 06/08/2023 07:13:57 -------------------------------------------------------------------------------- Progress Note Details Patient Name: Date of Service: [name] [name], [name] [name]. 06/08/2023 9:15 [name] Medical Record Number: [id number] Patient Account Number: [id number] Date of Birth/Sex: Treating RN: [birth year] (67 y.o. M) Primary Care Provider: Arva Chafe Other Clinician: Referring Provider: Treating Provider/Extender: [name] in Treatment: 40 Subjective Chief Complaint Information obtained from Patient 05/14/2020; patient is here for review of an abrasion injury on the left lateral calf 08/31/2022: [name] (1st metatarsal base, plantar) Chandra, Jlen [name] ([id number]) [id number] Page 7 of 12 History of Present Illness (HPI) ADMISSION 05/14/2020; this is [name] with multiple medical issues. Predominantly he has type 2 diabetes with [name] and also history of fairly significant PAD.  He had [name] in February 2017 he also had an atherectomy and angioplasty by Dr. [name] of the right popliteal artery in 2016. He is supposed to be getting arterial studies annually however this was interrupted last year because of the pandemic. He tells [place] he was at [facility] 2 weeks ago was getting out of of the scooter and traumatized his left lateral lower leg. There was [name] as the patient is on Plavix and Eliquis. They have been dressing this with Neosporin and doing [name] fairly good job. Wound measures 2.5 x 3.5 it does not have any depth he does not have [name] in his legs outside of surgery however he does have chronic edema and skin changes suggestive of chronic venous disease possibly some degree of lymphedema as well. Past medical history includes type 2 diabetes with peripheral neuropathy and gait instability, lumbar spondylosis, obstructive sleep apnea, history of [name], basal cell skin cancer, atrial fibrillation on anticoagulation, significant PAD as noted with [name] and [name] in 30th 2016. He also has [name] of coronary artery disease with an MI in 2002 hypertension hyperlipidemia and heart failure with preserved ejection fraction His last arterial studies I can see in epic were on 03/10/2018 this showed [name] and [name] with monophasic waveforms on the right. On the left his ABI was 1.20 with [name] and triphasic waveforms. He has not had arterial studies since. Our nurse in the clinic got an ABI on the left of 1.1 11/2; left anterior leg wound in the setting of chronic venous insufficiency. Wound was initially trauma. We have been using Hydrofera Blue under compression he has home health.. The wound looks [name] lot better today with improvement in surface  area 11/16; left anterior leg wound in the setting of chronic venous insufficiency. Wound was initially trauma. We have been using Hydrofera Blue under compression. The patient is closed today. He is supposed to follow-up with vein and vascular with regards to arterial insufficiency nevertheless his leg wounds are 12/3; apparently 2 weeks ago when  they were putting on their stockings they managed to get 3 wounds on the left anterior lower leg from abrasion when putting on the stockings. Home health came by the week of Thanksgiving and put Hydrofera Blue 4-layer wrap on this and there is only one superficial area remaining. The patient and his wife complained about the difficulties getting stockings on I think we are using 20/30. We will order bilateral external compression stockings which should be easier. 12/10; wound on the left anterior lower leg is closed. He has chronic venous insufficiency we ordered him Farrow wrap stockings unfortunately he did not bring these in. 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 He says that it has been present since mid December. He is currently residing in [facility] until March and [name] podiatrist has been looking after him there. They have been simply painting the area with Betadine. He has not had any lower extremity arterial studies since 2019, at which time his right ABI was 0.69. Measured in clinic today, it was 0.71. He is not aware of his most recent hemoglobin A1c, but historically he has had exceptionally poor control. On the basis of his right first metatarsal, there is [name] small crescent shaped wound. There is surrounding eschar and callus. There is no malodor or purulent drainage. 09/08/2022: The original wound is smaller today and fairly clean, but there is some discoloration and [name] pulpy texture to the adjacent callus. Underneath this, the tissue is open exposing the fat layer. It  looks like perhaps there was [name] crack in the callus and moisture got under the skin and caused breakdown. 09/15/2022: There has been more moisture related tissue breakdown. The callus is very soft and the underlying tissue is more open. 09/28/2022: The wound looks much better. He has done [name] good job keeping it dry. There is some callus overlying much of the wound surface. There is slough on the exposed open areas. 10/14/2022: There has been [name] lot of moisture related tissue breakdown. He is still forming callus over the top and then it seems that moisture gets underneath the callus and causes tissue damage. There is slough on the exposed wound surface. [place] 10/21/2022: His foot is less wet, but there is no significant change to his wound. He has developed [name] blister on his left anterior tibial surface. There is no open wound here, but he does have some fairly significant edema. We are planning to apply [name] total contact cast today. 10/23/2022: Here for his obligatory first cast change. He says he has not had any issues wearing the cast or walking in the boot. No detrimental effects on his wound. 10/29/2022: The wound is looking better. Clearly, putting him in the cast has prevented water from getting in under his callus and causing further tissue breakdown. The blister on his left anterior tibial surface has not yet ruptured. Edema control is significantly improved. 11/05/2022: The wound on his right plantar foot is getting better. There is still some callus accumulation around the wounds, but there are just 2 open areas now, [name] very small 1 on the medial aspect of his metatarsal head and [name] little bit larger 1 on the plantar surface. The blister on his left anterior tibial surface is now open with hanging dry skin. 11/12/2022: The left anterior tibial wound is closed. The wound on his right plantar foot  is smaller with just [name] little bit of callus  around the edges. 11/19/2022: The right plantar foot wound continues to contract. The surface is quite clean. The tiny satellite area on the medial aspect of his foot has closed. 11/26/2022: The wound is smaller again today. He is responding well to the offloading effects of total contact casting. 12/03/2022: The wound is about the same size today. There is [name] little bit of moisture around the perimeter of the wound. No significant tissue breakdown. 12/10/2022: The wound is smaller today. There has been no moisture-related tissue breakdown of any maceration. 12/21/2022: The wound continues to contract. Moisture control is excellent. 12/28/2022: The wound is [name] little bit smaller today. 01/05/2023: The wound measures smaller today. Excellent moisture control with zero evidence of maceration. The wound surface is clean with healthy granulation tissue. 01/13/2023: We are leaving his Apligraf in situ for [name] second week. There is no discolored drainage or malodor coming from the site. 01/19/2023: The wound is smaller today. The periwound skin is in good condition without tissue maceration and the healed areas of tissue appear to have improved integrity. 01/27/2023: Continued contraction of the wound. No tissue maceration. The wound surface is flush with the surrounding skin. [name] [name], [name] [name] ([id number]) [id number] Page 8 of 12 02/02/2023: The wound measured [name] little bit smaller today. There is some bruising just adjacent to the wound, but this has not resulted in any tissue breakdown. 02/09/2023: The wound is smaller again today. The area of bruising that was seen last week has resolved and there has been no tissue damage as [name] result. There is some callus accumulation around the wound edges along with minimal soft slough on the surface. 02/16/2023: The wound measured [name] little bit larger today. There is [name] little bit of maceration around the wound edges, which is likely responsible. 02/23/2023: The  wound is smaller today and there is epithelium encroaching around the edges. There is no periwound tissue maceration, nor has he accumulated any significant callus. 03/03/2023: His wound continues to contract. There is no maceration nor any significant callus. There is slight slough on the wound surface. 03/09/2023: The wound is [name] little bit smaller again today. He is managing very well in the peg assist offloading insert and there has been no tissue breakdown or periwound maceration as [name] result of being out of the total contact cast. 03/16/2023: The wound measured smaller again today. He continues to do well using the peg assist offloading insert. 03/23/2023: Although the wound measured the same size today, it visually appears smaller to me. There is good granulation tissue with minimal slough. There is [name] little callus around the edges. 03/30/2023: The wound is smaller today. The granulation tissue is healthy. There is [name] little bit of callus accumulation around the edges. 04/06/2023: Unfortunately, he did not have any assistance to change his dressing during the week and he was also walking substantially more due to attending [name] family event. This has resulted in fairly significant breakdown of his wound. Much of this seems to be related to moisture accumulation. 04/13/2023: The wound looks markedly better this week after being in the total contact cast. There is no moisture-related tissue breakdown, and the wound opening is smaller. 04/20/2023: The wound is smaller again today. The periwound skin is nice and dry and there is no concern for infection. 04/27/2023: The wound continues to contract. The surface is clean. There is [name] little bit of periwound moisture, but the wound itself has not broken  down at all. 05/04/2023: The wound is stable this week. He has had an increase in his left lower leg swelling, however, and he has an intact blister on the distal medial aspect of the leg, adjacent to where his  saphenous vein was harvested. 05/11/2023: The wound is [name] little bit narrower from 12-6 o'clock but slightly wider from 9-3 o'clock. It is otherwise fairly clean. The blister on his left lower leg is still present but flat. Edema is improved. 05/18/2023: The blister on his left lower leg has resolved. The wound on his right first metatarsal head is smaller today. There is [name] little bit of slough, callus, and eschar present. Moisture balance is good. 05/25/2023: His first metatarsal head wound is down to just [name] narrow slit. No concern for infection. 06/01/2023: The wound has contracted further. There is [name] little bit of periwound dry skin and callus. 06/08/2023: The wound is down to just [name] couple of millimeters. There is minimal dry skin and callus around the edges. No tissue maceration. Patient History Information obtained from Patient. Family History Unknown History. Social History Never smoker, Marital Status - Single, Alcohol Use - Never, Drug Use - No History, Caffeine Use - Rarely. Medical History Respiratory Patient has history of Sleep Apnea Cardiovascular Patient has history of Arrhythmia - [name]-Fib, Coronary Artery Disease - s/p CABG, Deep Vein Thrombosis, Hypertension, Myocardial Infarction, Peripheral Arterial Disease - s/p Fempop x 2 Endocrine Patient has history of Type II Diabetes Neurologic Patient has history of Neuropathy Hospitalization/Surgery History - colonoscopy. - polypectomy. - peripheral vascular cath. - shoulder arthroscopy. - carpal tunnel release. - coronary artery bypass. - appendectomy. - cardiac cath. - coronary angioplasty. - thrombectomy. - knee arthroplasty. - popliteal artery stent. 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  Bradycardic, asymptomatic. no acute distress. Vitals Time Taken: 10:04 AM, Height: 70 in, Weight: 260 lbs, BMI: 37.3, Temperature: 97.7 F, Pulse: 45 bpm, Respiratory Rate: 18 breaths/min, Blood Pressure: 192/76 mmHg. Respiratory Normal work of breathing on room air.. General Notes: 06/08/2023: The wound is down to just [name] couple of millimeters. There is minimal dry skin and callus around the edges. No tissue maceration. Integumentary (Hair, Skin) Wound #2 status is Open. Original cause of wound was Gradually Appeared. The date acquired was: 07/08/2022. The wound has been in treatment 40 weeks. The wound is located on the Right,Plantar Foot. The wound measures 0.2cm length x 0.2cm width x 0.1cm depth; 0.031cm^2 area and 0.003cm^3 volume. There is Fat Layer (Subcutaneous Tissue) exposed. There is no tunneling or undermining noted. There is [name] medium amount of serosanguineous drainage noted. The wound margin is distinct with the outline attached to the wound base. There is medium (34-66%) red granulation within the wound bed. There is [name] medium (34-66%) amount of necrotic tissue within the wound bed including Eschar. The periwound skin appearance had no abnormalities noted for moisture. The periwound skin appearance had no abnormalities noted for color. The periwound skin appearance exhibited: Callus. Periwound temperature was noted as No Abnormality. Assessment Active Problems ICD-10 Non-pressure chronic ulcer of other part of right foot with fat layer exposed Type 2 diabetes mellitus with foot ulcer Peripheral vascular disease, unspecified Localized edema Atherosclerotic heart disease of native coronary artery without angina pectoris Essential (primary) hypertension Type 2 diabetes mellitus with hyperglycemia Type 2 diabetes mellitus with diabetic neuropathy, unspecified Cerebral infarction,  unspecified Procedures Wound #2 Pre-procedure diagnosis of Wound #2 is [name] Diabetic Wound/Ulcer of the  Lower Extremity located on the Right,Plantar Foot .Severity of Tissue Pre Debridement is: Fat layer exposed. There was [name] Excisional Skin/Subcutaneous Tissue Debridement with [name] total area of 0.03 sq cm performed by Duanne Guess, MD. With the following instrument(s): Curette to remove Non-Viable tissue/material. Material removed includes Eschar, Callus, Subcutaneous Tissue, and Slough after achieving pain control using Lidocaine 5% topical ointment. No specimens were taken. [name] time out was conducted at 09:32, prior to the start of the procedure. [name] Minimum amount of bleeding was controlled with Pressure. The procedure was tolerated well. Post Debridement Measurements: 0.2cm length x 0.2cm width x 0.1cm depth; 0.003cm^3 volume. Character of Wound/Ulcer Post Debridement is improved. Severity of Tissue Post Debridement is: Fat layer exposed. Post procedure Diagnosis Wound #2: Same as Pre-Procedure Pre-procedure diagnosis of Wound #2 is [name] Diabetic Wound/Ulcer of the Lower Extremity located on the Right,Plantar Foot . There was [name] T Contact Cast otal Procedure by Duanne Guess, MD. Post procedure Diagnosis Wound #2: Same as Pre-Procedure Plan Follow-up Appointments: Return Appointment in 1 week. - Dr. Lady Gary - room 2 TCC Anesthetic: (In clinic) Topical Lidocaine 5% applied to wound bed Bathing/ Shower/ Hygiene: May shower with protection but do not get wound dressing(s) wet. Protect dressing(s) with water repellant cover (for example, large plastic bag) or [name] cast cover and may then take shower. Off-Loading: T Contact Cast to Right Lower Extremity otal Removable cast walker boot to: - right leg Non Wound Condition: Other Non Wound Condition Orders/Instructions: - compression wrap urgo lite to left leg The following medication(s) was prescribed: lidocaine topical 4 % cream cream topical was prescribed at facility WOUND #2: - Foot Wound Laterality: Plantar, Right Cleanser: Soap and Water 1 x  Per Week/30 Days [name] [name], [name] [name] ([id number]) [id number] Page 10 of 12 Discharge Instructions: May shower and wash wound with dial antibacterial soap and water prior to dressing change. Cleanser: Wound Cleanser 1 x Per Week/30 Days Discharge Instructions: Cleanse the wound with wound cleanser prior to applying [name] clean dressing using gauze sponges, not tissue or cotton balls. Peri-Wound Care: Zinc Oxide Ointment 30g tube 1 x Per Week/30 Days Discharge Instructions: Apply Zinc Oxide to periwound with each dressing change Topical: Gentamicin 1 x Per Week/30 Days Discharge Instructions: As directed by physician Topical: Mupirocin Ointment 1 x Per Week/30 Days Discharge Instructions: Apply Mupirocin (Bactroban) as instructed Prim Dressing: Maxorb Extra Ag+ Alginate Dressing, 2x2 (in/in) 1 x Per Week/30 Days ary Discharge Instructions: Apply to wound bed as instructed Secondary Dressing: Woven Gauze Sponge, Non-Sterile 4x4 in 1 x Per Week/30 Days Discharge Instructions: Apply over primary dressing as directed. Secondary Dressing: Zetuvit Plus 4x8 in 1 x Per Week/30 Days Discharge Instructions: Apply over primary dressing as directed. Secured With: [employer], 4.5x3.1 (in/yd) 1 x Per Week/30 Days Discharge Instructions: Secure with Kerlix as directed. Secured With: 24M Medipore H Soft Cloth Surgical T ape, 4 x 10 (in/yd) 1 x Per Week/30 Days Discharge Instructions: Secure with tape as directed. 06/08/2023: The wound is down to just [name] couple of millimeters. There is minimal dry skin and callus around the edges. No tissue maceration. I used [name] curette to debride slough, dry skin and eschar, and subcutaneous tissue from the foot wound. We will continue topical gentamicin and mupirocin with silver alginate, periwound zinc oxide, and [name] total contact cast. He will follow-up in 1 week. Electronic Signature(s) Signed: 06/08/2023 10:16:27 AM  By: Duanne Guess MD  FACS Entered By: Duanne Guess on 06/08/2023 07:16:27 -------------------------------------------------------------------------------- HxROS Details Patient Name: Date of Service: [name] [name], [name] [name]. 06/08/2023 9:15 [name] Medical Record Number: [id number] Patient Account Number: [id number] Date of Birth/Sex: Treating RN: [birth year] (67 y.o. M) Primary Care Provider: Arva Chafe Other Clinician: Referring Provider: Treating Provider/Extender: [name] in Treatment: 40 Information Obtained From Patient Respiratory Medical History: Positive for: Sleep Apnea Cardiovascular Medical History: Positive for: Arrhythmia - [name]-Fib; Coronary Artery Disease - s/p CABG; Deep Vein Thrombosis; Hypertension; Myocardial Infarction; Peripheral Arterial Disease - s/p Fempop x 2 Past Medical History Notes: CVA x 3 Endocrine Medical History: Positive for: Type II Diabetes Time with diabetes: since 1997 Treated with: Insulin, Oral agents Blood sugar tested every day: Yes Tested : 2-3x per day Musculoskeletal Medical History: Past Medical History Notes: carpal tunnel syndrome [name] [name], [name] [name] ([id number]) [id number] Page 11 of 12 Neurologic Medical History: Positive for: Neuropathy Past Medical History Notes: stroke Oncologic Medical History: Past Medical History Notes: skin cancer Immunizations Pneumococcal Vaccine: Received Pneumococcal Vaccination: Yes Received Pneumococcal Vaccination On or After 60th Birthday: Yes Implantable Devices None Hospitalization / Surgery History Type of Hospitalization/Surgery colonoscopy polypectomy peripheral vascular cath shoulder arthroscopy carpal tunnel release coronary artery bypass appendectomy cardiac cath coronary angioplasty thrombectomy knee arthroplasty popliteal artery stent Family and Social History Unknown History: Yes; Never smoker; Marital Status - Single; Alcohol Use:  Never; Drug Use: No History; Caffeine Use: Rarely; Financial Concerns: No; Food, Clothing or Shelter Needs: No; Support System Lacking: No; Transportation Concerns: No Electronic Signature(s) Signed: 06/08/2023 10:29:35 AM By: Duanne Guess MD FACS Entered By: Duanne Guess on 06/08/2023 07:15:00 -------------------------------------------------------------------------------- Total Contact Cast Details Patient Name: Date of Service: [name] Sabal RK [name]. 06/08/2023 9:15 [name] Medical Record Number: [id number] Patient Account Number: [id number] Date of Birth/Sex: Treating RN: 08/18/1955 (67 y.o. [name] [name] Primary Care Provider: Arva Chafe Other Clinician: Referring Provider: Treating Provider/Extender: [name] in Treatment: 40 T Contact Cast Applied for Wound Assessment: otal Wound #2 Right,Plantar Foot Performed By: Physician Duanne Guess, MD The following information was scribed by: Samuella Bruin The information was scribed for: Duanne Guess Post Procedure Diagnosis Same as Pre-procedure Electronic Signature(s) Signed: 06/08/2023 10:29:35 AM By: Duanne Guess MD FACS Signed: 06/08/2023 3:29:21 PM By: Samuella Bruin Entered By: Samuella Bruin on 06/08/2023 06:32:37 [name] [name], [name] [name] ([id number]) [id number] Page 12 of 12 -------------------------------------------------------------------------------- SuperBill Details Patient Name: Date of Service: [name] [name], [name] [name]. 06/08/2023 Medical Record Number: [id number] Patient Account Number: [id number] Date of Birth/Sex: Treating RN: [birth year] (67 y.o. M) Primary Care Provider: Arva Chafe Other Clinician: Referring Provider: Treating Provider/Extender: [name] in Treatment: 40 Diagnosis Coding ICD-10 Codes Code Description [phone] Non-pressure chronic ulcer of other part of right foot with fat layer  exposed E11.621 Type 2 diabetes mellitus with foot ulcer I73.9 Peripheral vascular disease, unspecified R60.0 Localized edema I25.10 Atherosclerotic heart disease of native coronary artery without angina pectoris I10 Essential (primary) hypertension E11.65 Type 2 diabetes mellitus with hyperglycemia E11.40 Type 2 diabetes mellitus with diabetic neuropathy, unspecified I63.9 Cerebral infarction, unspecified Facility Procedures : CPT4 Code: [id number] Description: 11042 - DEB SUBQ TISSUE 20 SQ CM/< ICD-10 Diagnosis Description L97.512 Non-pressure chronic ulcer of other part of right foot with fat layer exposed Modifier: Quantity: 1 Physician Procedures : CPT4 Code Description Modifier [id number] - WC PHYS LEVEL 4 - EST PT ICD-10 Diagnosis Description L97.512 Non-pressure chronic ulcer of other part of  right foot with fat layer exposed E11.621 Type 2 diabetes mellitus with foot ulcer I73.9 Peripheral  vascular disease, unspecified R60.0 Localized edema Quantity: 1 : [id number] 11042 - WC PHYS SUBQ TISS 20 SQ CM ICD-10 Diagnosis Description L97.512 Non-pressure chronic ulcer of other part of right foot with fat layer exposed Quantity: 1 Electronic Signature(s) Signed: 06/08/2023 10:16:47 AM By: Duanne Guess MD FACS Entered By: Duanne Guess on 06/08/2023 07:16:46

## 2023-06-08 NOTE — Progress Notes (Signed)
[name], [name] [id number]) [id number] Page 1 of 8 Visit Report for 06/08/2023 Arrival Information Details Patient Name: Date of Service: [name], [name]. 06/08/2023 9:15 [name] Medical Record Number: [id number] Patient Account Number: [id number] Date of Birth/Sex: Treating RN: [birth year] (67 y.o. [name] Primary Care Toia Micale: Arva Chafe Other Clinician: Referring Nissan Frazzini: Treating Lenis Nettleton/Extender: [name] in Treatment: 40 Visit Information History Since Last Visit Added or deleted any medications: No Patient Arrived: Ambulatory Any new allergies or adverse reactions: No Arrival Time: 09:19 Had [name] fall or experienced change in No Accompanied By: self activities of daily living that may affect Transfer Assistance: None risk of falls: Patient Identification Verified: Yes Signs or symptoms of abuse/neglect since last visito No Secondary Verification Process Completed: Yes Hospitalized since last visit: No Patient Requires Transmission-Based Precautions: No Implantable device outside of the clinic excluding No Patient Has Alerts: No cellular tissue based products placed in the center since last visit: Has Dressing in Place as Prescribed: Yes Has Footwear/Offloading in Place as Prescribed: Yes Right: T Contact Cast otal Pain Present Now: No Electronic Signature(s) Signed: 06/08/2023 3:29:21 PM By: Samuella Bruin Entered By: Samuella Bruin on 06/08/2023 06:19:34 -------------------------------------------------------------------------------- Encounter Discharge Information Details Patient Name: Date of Service: [name] Pian, [name]. 06/08/2023 9:15 [name] Medical Record Number: [id number] Patient Account Number: [id number] Date of Birth/Sex: Treating RN: 11-12-1955 (67 y.o. [name] Primary Care Alyda Megna: Arva Chafe Other Clinician: Referring Makayli Bracken: Treating Crystalynn Mcinerney/Extender:  [name] in Treatment: 40 Encounter Discharge Information Items Post Procedure Vitals Discharge Condition: Stable Temperature (F): 97.7 Ambulatory Status: Ambulatory Pulse (bpm): 45 Discharge Destination: Home Respiratory Rate (breaths/min): 18 Transportation: Private Auto Blood Pressure (mmHg): 192/76 Accompanied By: self Schedule Follow-up Appointment: Yes Clinical Summary of Care: Patient Declined Electronic Signature(s) Signed: 06/08/2023 3:29:21 PM By: Samuella Bruin Entered By: Samuella Bruin on 06/08/2023 07:08:27 [name] ([id number]) [id number] Page 2 of 8 -------------------------------------------------------------------------------- Lower Extremity Assessment Details Patient Name: Date of Service: [name], [name]. 06/08/2023 9:15 [name] Medical Record Number: [id number] Patient Account Number: [id number] Date of Birth/Sex: Treating RN: Dec 12, 1955 (67 y.o. [name] Primary Care Laquentin Loudermilk: Arva Chafe Other Clinician: Referring Anastassia Noack: Treating Juliauna Stueve/Extender: [name] in Treatment: 40 Edema Assessment Assessed: [name]: No] [Right: No] Edema: [Left: Ye] [Right: s] Calf Left: Right: Point of Measurement: From Medial Instep 37.8 cm Ankle Left: Right: Point of Measurement: From Medial Instep 28 cm Vascular Assessment Pulses: Dorsalis Pedis Palpable: [Right:Yes] Extremity colors, hair growth, and conditions: Extremity Color: [Right:Normal] Hair Growth on Extremity: [Right:Yes] Temperature of Extremity: [Right:Warm] Capillary Refill: [Right:< 3 seconds] Dependent Rubor: [Right:No No] Electronic Signature(s) Signed: 06/08/2023 3:29:21 PM By: Samuella Bruin Entered By: Samuella Bruin on 06/08/2023 06:27:53 -------------------------------------------------------------------------------- Multi Wound Chart Details Patient Name: Date of  Service: [name] Pian, [name]. 06/08/2023 9:15 [name] Medical Record Number: [id number] Patient Account Number: [id number] Date of Birth/Sex: Treating RN: 1956/01/01 (67 y.o. 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 [name] Primary Etiology: Extremity Sleep Apnea, Arrhythmia, Coronary [name] [name] Comorbid History: Artery Disease, Deep Vein Thrombosis, Hypertension, Myocardial Infarction, Peripheral Arterial Disease, Type II Diabetes, Neuropathy 07/08/2022 [name] [name]  Date Acquired: 86 [name] [name] Weeks of Treatment: Open [name] [name] Wound Status: No [name] [name] Wound Recurrence: 0.2x0.2x0.1 [name] [name] Measurements L x W x D (cm) 0.031 [name] [name] [name] : rea 0.003 [name] [name] Volume (cm) : 98.80% [name] [name] % Reduction in [name] rea: 98.80% [name] [name] % Reduction in Volume: Grade 1 [name] [name] Classification: Medium [name] [name] Exudate [name] mount: Serosanguineous [name] [name] Exudate Type: red, brown [name] [name] Exudate Color: Distinct, outline attached [name] [name] Wound Margin: Medium (34-66%) [name] [name] Granulation [name] mount: Red [name] [name] Granulation Quality: Medium (34-66%) [name] [name] Necrotic [name] mount: Eschar [name] [name] Necrotic Tissue: Fat Layer (Subcutaneous Tissue): Yes [name] [name] Exposed Structures: Fascia: No Tendon: No Muscle: No Joint: No Bone: No Large (67-100%) [name] [name] Epithelialization: Debridement - Excisional [name] [name] Debridement: Pre-procedure Verification/Time Out 09:32 [name] [name] Taken: Lidocaine 5% topical ointment [name] [name] Pain Control: Necrotic/Eschar, Callus, [name] [name] Tissue  Debrided: Subcutaneous, Slough Skin/Subcutaneous Tissue [name] [name] Level: 0.03 [name] [name] Debridement [name] (sq cm): rea Curette [name] [name] Instrument: Minimum [name] [name] Bleeding: Pressure [name] [name] Hemostasis Achieved: Debridement Treatment Response: Procedure was tolerated well [name] [name] Post Debridement Measurements L x 0.2x0.2x0.1 [name] [name] W x D (cm) 0.003 [name] [name] Post Debridement Volume: (cm) Callus: Yes [name] [name] Periwound Skin Texture: Maceration: Yes [name] [name] Periwound Skin Moisture: Dry/Scaly: No No Abnormalities Noted [name] [name] Periwound Skin Color: No Abnormality [name] [name] Temperature: Debridement [name] [name] Procedures Performed: T Contact Cast otal Treatment Notes Wound #2 (Foot) Wound Laterality: Plantar, Right Cleanser Soap and Water Discharge Instruction: May shower and wash wound with dial antibacterial soap and water prior to dressing change. Wound Cleanser Discharge Instruction: Cleanse the wound with wound cleanser prior to applying [name] clean dressing using gauze sponges, not tissue or cotton balls. Peri-Wound Care Zinc Oxide Ointment 30g tube Discharge Instruction: Apply Zinc Oxide to periwound with each dressing change Topical Gentamicin Discharge Instruction: As directed by physician Mupirocin Ointment Discharge Instruction: Apply Mupirocin (Bactroban) as instructed [name] ([id number]) [id number] Page 4 of 8 Primary Dressing Maxorb Extra Ag+ Alginate Dressing, 2x2 (in/in) Discharge Instruction: Apply to wound bed as instructed Secondary Dressing Woven Gauze Sponge, Non-Sterile 4x4 in Discharge Instruction: Apply over primary dressing as directed. Zetuvit Plus 4x8 in Discharge Instruction: Apply over primary dressing as directed. Secured With [employer], 4.5x3.1 (in/yd) Discharge Instruction: Secure with Kerlix as directed. 33M Medipore H Soft Cloth Surgical T ape, 4 x 10 (in/yd) Discharge Instruction: Secure with tape as  directed. Compression Wrap Compression Stockings Add-Ons Electronic Signature(s) Signed: 06/08/2023 10:14:09 AM By: Duanne Guess MD FACS Entered By: Duanne Guess on 06/08/2023 07:14:08 -------------------------------------------------------------------------------- Multi-Disciplinary Care Plan Details Patient Name: Date of Service: [name] Pian, [name]. 06/08/2023 9:15 [name] Medical Record Number: [id number] Patient Account Number: [id number] Date of Birth/Sex: Treating RN: [birth year] (67 y.o. [name] Primary Care Heyden Jaber: Arva Chafe Other Clinician: Referring Claudio Mondry: Treating Haniah Penny/Extender: [name] in Treatment: 40 Multidisciplinary Care Plan reviewed with physician Active Inactive Abuse / Safety / Falls / Self Care Management Nursing Diagnoses: Impaired physical mobility Potential for falls Goals: Patient will not experience any injury related to falls Date Initiated: 08/31/2022 Target Resolution Date: 06/18/2023 Goal Status: Active Patient/caregiver will verbalize/demonstrate measures taken to improve the patient's personal safety Date Initiated: 08/31/2022 Target Resolution Date: 06/18/2023 Goal Status: Active Interventions: Provide education on basic hygiene Provide education on fall prevention Notes: Wound/Skin Impairment Nursing Diagnoses: Impaired tissue integrity Knowledge deficit related to  ulceration/compromised skin integrity [name] ([id number]) [id number] Page 5 of 8 Goals: Patient/caregiver will verbalize understanding of skin care regimen Date Initiated: 08/31/2022 Target Resolution Date: 06/18/2023 Goal Status: Active Interventions: Assess patient/caregiver ability to obtain necessary supplies Assess patient/caregiver ability to perform ulcer/skin care regimen upon admission and as needed Assess ulceration(s) every visit Treatment Activities: Skin care regimen  initiated : 08/31/2022 Topical wound management initiated : 08/31/2022 Notes: Electronic Signature(s) Signed: 06/08/2023 3:29:21 PM By: Samuella Bruin Entered By: Samuella Bruin on 06/08/2023 06:35:25 -------------------------------------------------------------------------------- Pain Assessment Details Patient Name: Date of Service: [name] Pian, [name]. 06/08/2023 9:15 [name] Medical Record Number: [id number] Patient Account Number: [id number] Date of Birth/Sex: Treating RN: [birth year] (67 y.o. [name] Primary Care Kennette Cuthrell: Arva Chafe Other Clinician: Referring Kariya Lavergne: Treating Mossie Gilder/Extender: [name] in Treatment: 40 Active Problems Location of Pain Severity and Description of Pain Patient Has Paino No Site Locations Rate the pain. Current Pain Level: 0 Pain Management and Medication Current Pain Management: Electronic Signature(s) Signed: 06/08/2023 3:29:21 PM By: Samuella Bruin Entered By: Samuella Bruin on 06/08/2023 06:19:42 [name] ([id number]) [id number] Page 6 of 8 -------------------------------------------------------------------------------- Patient/Caregiver Education Details Patient Name: Date of Service: GALES, [name]. 11/19/2024andnbsp9:15 [name] Medical Record Number: [id number] Patient Account Number: [id number] Date of Birth/Gender: Treating RN: 10-25-1955 (67 y.o. [name] Primary Care Physician: Arva Chafe Other Clinician: Referring Physician: Treating Physician/Extender: [name] in Treatment: 40 Education Assessment Education Provided To: Patient Education Topics Provided Wound/Skin Impairment: Methods: Explain/Verbal Responses: Reinforcements needed, State content correctly Electronic Signature(s) Signed: 06/08/2023 3:29:21 PM By: Samuella Bruin Entered By: Samuella Bruin on 06/08/2023  06:35:38 -------------------------------------------------------------------------------- Wound Assessment Details Patient Name: Date of Service: [name] Pian, [name]. 06/08/2023 9:15 [name] Medical Record Number: [id number] Patient Account Number: [id number] Date of Birth/Sex: Treating RN: 10-19-1955 (67 y.o. [name] Primary Care Lannah Koike: Arva Chafe Other Clinician: Referring Xylah Early: Treating Abdalrahman Clementson/Extender: [name] in Treatment: 40 Wound Status Wound Number: 2 Primary Diabetic Wound/Ulcer of the Lower Extremity Etiology: Wound Location: Right, Plantar Foot Wound Open Wounding Event: Gradually Appeared Status: Date Acquired: 07/08/2022 Comorbid Sleep Apnea, Arrhythmia, Coronary Artery Disease, Deep Vein Weeks Of Treatment: 40 History: Thrombosis, Hypertension, Myocardial Infarction, Peripheral Arterial Clustered Wound: No Disease, Type II Diabetes, Neuropathy Photos Wound Measurements Length: (cm) 0. Width: (cm) 0. [name] ([id number]) Depth: (cm) Area: (cm) Volume: (cm) 2 % Reduction in Area: 98.8% 2 % Reduction in Volume: 98.8% [id number] Page 7 of 8 0.1 Epithelialization: Large (67-100%) 0.031 Tunneling: No 0.003 Undermining: No Wound Description Classification: Grade 1 Wound Margin: Distinct, outline attached Exudate Amount: Medium Exudate Type: Serosanguineous Exudate Color: red, brown Foul Odor After Cleansing: No Slough/Fibrino Yes Wound Bed Granulation Amount: Medium (34-66%) Exposed Structure Granulation Quality: Red Fascia Exposed: No Necrotic Amount: Medium (34-66%) Fat Layer (Subcutaneous Tissue) Exposed: Yes Necrotic Quality: Eschar Tendon Exposed: No Muscle Exposed: No Joint Exposed: No Bone Exposed: No Periwound Skin Texture Texture Color No Abnormalities Noted: No No Abnormalities Noted: Yes Callus: Yes Temperature / Pain Temperature: No  Abnormality Moisture No Abnormalities Noted: Yes Treatment Notes Wound #2 (Foot) Wound Laterality: Plantar, Right Cleanser Soap and Water Discharge Instruction: May shower and wash wound with dial antibacterial soap and water prior to dressing change. Wound Cleanser Discharge Instruction: Cleanse the wound with wound cleanser prior to applying [name] clean dressing using gauze sponges, not tissue or cotton balls. Peri-Wound Care Zinc Oxide Ointment 30g tube Discharge  Instruction: Apply Zinc Oxide to periwound with each dressing change Topical Gentamicin Discharge Instruction: As directed by physician Mupirocin Ointment Discharge Instruction: Apply Mupirocin (Bactroban) as instructed Primary Dressing Maxorb Extra Ag+ Alginate Dressing, 2x2 (in/in) Discharge Instruction: Apply to wound bed as instructed Secondary Dressing Woven Gauze Sponge, Non-Sterile 4x4 in Discharge Instruction: Apply over primary dressing as directed. Zetuvit Plus 4x8 in Discharge Instruction: Apply over primary dressing as directed. Secured With [employer], 4.5x3.1 (in/yd) Discharge Instruction: Secure with Kerlix as directed. 25M Medipore H Soft Cloth Surgical T ape, 4 x 10 (in/yd) Discharge Instruction: Secure with tape as directed. Compression Wrap Compression Stockings Add-Ons Electronic Signature(s) Signed: 06/08/2023 3:29:21 PM By: Samuella Bruin Entered By: Samuella Bruin on 06/08/2023 06:30:15 [name] ([id number]) [id number] Page 8 of 8 -------------------------------------------------------------------------------- Vitals Details Patient Name: Date of Service: STUTE, [name]. 06/08/2023 9:15 [name] Medical Record Number: [id number] Patient Account Number: [id number] Date of Birth/Sex: Treating RN: 12-09-1955 (67 y.o. [name] Primary Care Iyari Hagner: Arva Chafe Other Clinician: Referring Eria Lozoya: Treating Ashan Cueva/Extender: [name] in Treatment: 40 Vital Signs Time Taken: 10:04 Temperature (F): 97.7 Height (in): 70 Pulse (bpm): 45 Weight (lbs): 260 Respiratory Rate (breaths/min): 18 Body Mass Index (BMI): 37.3 Blood Pressure (mmHg): 192/76 Reference Range: 80 - 120 mg / dl Electronic Signature(s) Signed: 06/08/2023 3:29:21 PM By: Samuella Bruin Entered By: Samuella Bruin on 06/08/2023 07:04:42

## 2023-06-11 ENCOUNTER — Encounter: Payer: Self-pay | Admitting: Nurse Practitioner

## 2023-06-11 ENCOUNTER — Ambulatory Visit: Payer: Medicare HMO | Attending: Nurse Practitioner | Admitting: Nurse Practitioner

## 2023-06-11 ENCOUNTER — Other Ambulatory Visit (HOSPITAL_COMMUNITY): Payer: Self-pay

## 2023-06-11 VITALS — BP 128/62 | HR 57 | Ht 70.0 in | Wt 260.0 lb

## 2023-06-11 DIAGNOSIS — I2510 Atherosclerotic heart disease of native coronary artery without angina pectoris: Secondary | ICD-10-CM

## 2023-06-11 DIAGNOSIS — Z86718 Personal history of other venous thrombosis and embolism: Secondary | ICD-10-CM

## 2023-06-11 DIAGNOSIS — Z794 Long term (current) use of insulin: Secondary | ICD-10-CM

## 2023-06-11 DIAGNOSIS — I4821 Permanent atrial fibrillation: Secondary | ICD-10-CM | POA: Diagnosis not present

## 2023-06-11 DIAGNOSIS — E1159 Type 2 diabetes mellitus with other circulatory complications: Secondary | ICD-10-CM

## 2023-06-11 DIAGNOSIS — Z8673 Personal history of transient ischemic attack (TIA), and cerebral infarction without residual deficits: Secondary | ICD-10-CM

## 2023-06-11 DIAGNOSIS — E785 Hyperlipidemia, unspecified: Secondary | ICD-10-CM | POA: Diagnosis not present

## 2023-06-11 DIAGNOSIS — R0609 Other forms of dyspnea: Secondary | ICD-10-CM | POA: Diagnosis not present

## 2023-06-11 DIAGNOSIS — G4733 Obstructive sleep apnea (adult) (pediatric): Secondary | ICD-10-CM

## 2023-06-11 DIAGNOSIS — I351 Nonrheumatic aortic (valve) insufficiency: Secondary | ICD-10-CM | POA: Diagnosis not present

## 2023-06-11 DIAGNOSIS — I10 Essential (primary) hypertension: Secondary | ICD-10-CM

## 2023-06-11 DIAGNOSIS — I739 Peripheral vascular disease, unspecified: Secondary | ICD-10-CM

## 2023-06-11 NOTE — Patient Instructions (Signed)
Medication Instructions:  Your physician recommends that you continue on your current medications as directed. Please refer to the Current Medication list given to you today.  *If you need a refill on your cardiac medications before your next appointment, please call your pharmacy*   Lab Work: Fasting Lipid panel, CMET, CBC today   Testing/Procedures: Your physician has requested that you have an echocardiogram. Echocardiography is a painless test that uses sound waves to create images of your heart. It provides your doctor with information about the size and shape of your heart and how well your heart's chambers and valves are working. This procedure takes approximately one hour. There are no restrictions for this procedure. Please do NOT wear cologne, perfume, aftershave, or lotions (deodorant is allowed). Please arrive 15 minutes prior to your appointment time.  Please note: We ask at that you not bring children with you during ultrasound (echo/ vascular) testing. Due to room size and safety concerns, children are not allowed in the ultrasound rooms during exams. Our front office staff cannot provide observation of children in our lobby area while testing is being conducted. An adult accompanying a patient to their appointment will only be allowed in the ultrasound room at the discretion of the ultrasound technician under special circumstances. We apologize for any inconvenience.    Follow-Up: At [facility], you and your health needs are our priority.  As part of our continuing mission to provide you with exceptional heart care, we have created designated Provider Care Teams.  These Care Teams include your primary Cardiologist (physician) and Advanced Practice Providers (APPs -  Physician Assistants and Nurse Practitioners) who all work together to provide you with the care you need, when you need it.  We recommend signing up for the patient portal called "MyChart".  Sign up  information is provided on this After Visit Summary.  MyChart is used to connect with patients for Virtual Visits (Telemedicine).  Patients are able to view lab/test results, encounter notes, upcoming appointments, etc.  Non-urgent messages can be sent to your provider as well.   To learn more about what you can do with MyChart, go to [email or web address].    Your next appointment:   4-6 month(s)  Provider:   Nanetta Batty, MD     Other Instructions
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
## 2023-06-12 LAB — CBC
Hematocrit: 46 % (ref 37.5–51.0)
Hemoglobin: 14.7 g/dL (ref 13.0–17.7)
MCH: 28.4 pg (ref 26.6–33.0)
MCHC: 32 g/dL (ref 31.5–35.7)
MCV: 89 fL (ref 79–97)
Platelets: 235 10*3/uL (ref 150–450)
RBC: 5.18 x10E6/uL (ref 4.14–5.80)
RDW: 14 % (ref 11.6–15.4)
WBC: 7.8 10*3/uL (ref 3.4–10.8)

## 2023-06-12 LAB — COMPREHENSIVE METABOLIC PANEL
ALT: 9 [IU]/L (ref 0–44)
AST: 14 [IU]/L (ref 0–40)
Albumin: 4.2 g/dL (ref 3.9–4.9)
Alkaline Phosphatase: 63 [IU]/L (ref 44–121)
BUN/Creatinine Ratio: 11 (ref 10–24)
BUN: 16 mg/dL (ref 8–27)
Bilirubin Total: 0.6 mg/dL (ref 0.0–1.2)
CO2: 22 mmol/L (ref 20–29)
Calcium: 9.5 mg/dL (ref 8.6–10.2)
Chloride: 103 mmol/L (ref 96–106)
Creatinine, Ser: 1.44 mg/dL — ABNORMAL HIGH (ref 0.76–1.27)
Globulin, Total: 2.8 g/dL (ref 1.5–4.5)
Glucose: 152 mg/dL — ABNORMAL HIGH (ref 70–99)
Potassium: 4.4 mmol/L (ref 3.5–5.2)
Sodium: 141 mmol/L (ref 134–144)
Total Protein: 7 g/dL (ref 6.0–8.5)
eGFR: 53 mL/min/{1.73_m2} — ABNORMAL LOW (ref >59)

## 2023-06-13 ENCOUNTER — Encounter: Payer: Self-pay | Admitting: Nurse Practitioner

## 2023-06-15 ENCOUNTER — Encounter (HOSPITAL_BASED_OUTPATIENT_CLINIC_OR_DEPARTMENT_OTHER): Payer: Medicare HMO | Admitting: General Surgery

## 2023-06-15 DIAGNOSIS — E11621 Type 2 diabetes mellitus with foot ulcer: Secondary | ICD-10-CM | POA: Diagnosis not present

## 2023-06-15 DIAGNOSIS — I5032 Chronic diastolic (congestive) heart failure: Secondary | ICD-10-CM | POA: Diagnosis not present

## 2023-06-15 DIAGNOSIS — G4733 Obstructive sleep apnea (adult) (pediatric): Secondary | ICD-10-CM | POA: Diagnosis not present

## 2023-06-15 DIAGNOSIS — I110 Hypertensive heart disease with heart failure: Secondary | ICD-10-CM | POA: Diagnosis not present

## 2023-06-15 DIAGNOSIS — I252 Old myocardial infarction: Secondary | ICD-10-CM | POA: Diagnosis not present

## 2023-06-15 DIAGNOSIS — I4891 Unspecified atrial fibrillation: Secondary | ICD-10-CM | POA: Diagnosis not present

## 2023-06-15 DIAGNOSIS — L97512 Non-pressure chronic ulcer of other part of right foot with fat layer exposed: Secondary | ICD-10-CM | POA: Diagnosis not present

## 2023-06-15 DIAGNOSIS — I2510 Atherosclerotic heart disease of native coronary artery without angina pectoris: Secondary | ICD-10-CM | POA: Diagnosis not present

## 2023-06-15 DIAGNOSIS — Z7902 Long term (current) use of antithrombotics/antiplatelets: Secondary | ICD-10-CM | POA: Diagnosis not present

## 2023-06-15 DIAGNOSIS — E1142 Type 2 diabetes mellitus with diabetic polyneuropathy: Secondary | ICD-10-CM | POA: Diagnosis not present

## 2023-06-15 DIAGNOSIS — Z7901 Long term (current) use of anticoagulants: Secondary | ICD-10-CM | POA: Diagnosis not present

## 2023-06-15 DIAGNOSIS — E1151 Type 2 diabetes mellitus with diabetic peripheral angiopathy without gangrene: Secondary | ICD-10-CM | POA: Diagnosis not present

## 2023-06-15 DIAGNOSIS — Z951 Presence of aortocoronary bypass graft: Secondary | ICD-10-CM | POA: Diagnosis not present

## 2023-06-15 DIAGNOSIS — I872 Venous insufficiency (chronic) (peripheral): Secondary | ICD-10-CM | POA: Diagnosis not present

## 2023-06-16 NOTE — Progress Notes (Signed)
Boling, Bayne [name] ([id number]) [id number] Page 1 of 8 Visit Report for 06/15/2023 Arrival Information Details Patient Name: Date of Service: [name] [name] [name]. 06/15/2023 9:15 [name] Medical Record Number: [id number] Patient Account Number: [id number] Date of Birth/Sex: Treating RN: 07/13/1956 (67 y.o. [name] [name] Primary Care Telly Broberg: Arva Chafe Other Clinician: Referring Kerin Cecchi: Treating Orlene Salmons/Extender: [name] in Treatment: 46 Visit Information History Since Last Visit Added or deleted any medications: No Patient Arrived: Ambulatory Any new allergies or adverse reactions: No Arrival Time: 09:11 Had [name] fall or experienced change in No Accompanied By: self activities of daily living that may affect Transfer Assistance: None risk of falls: Patient Identification Verified: Yes Signs or symptoms of abuse/neglect since last visito No Secondary Verification Process Completed: Yes Hospitalized since last visit: No Patient Requires Transmission-Based Precautions: No Implantable device outside of the clinic excluding No Patient Has Alerts: No cellular tissue based products placed in the center since last visit: Has Dressing in Place as Prescribed: Yes Has Footwear/Offloading in Place as Prescribed: Yes Right: T Contact Cast otal Pain Present Now: No Electronic Signature(s) Signed: 06/15/2023 5:01:06 PM By: Samuella Bruin Entered By: Samuella Bruin on 06/15/2023 09:12:22 -------------------------------------------------------------------------------- Encounter Discharge Information Details Patient Name: Date of Service: [name] [name] [name] [name]. 06/15/2023 9:15 [name] Medical Record Number: [id number] Patient Account Number: [id number] Date of Birth/Sex: Treating RN: [birth year] (67 y.o. [name] [name] Primary Care Rainey Rodger: Arva Chafe Other Clinician: Referring Cambree Hendrix: Treating Catie Chiao/Extender:  [name] in Treatment: 60 Encounter Discharge Information Items Post Procedure Vitals Discharge Condition: Stable Temperature (F): 97.8 Ambulatory Status: Ambulatory Pulse (bpm): 60 Discharge Destination: Home Respiratory Rate (breaths/min): 18 Transportation: Private Auto Blood Pressure (mmHg): 167/62 Accompanied By: self Schedule Follow-up Appointment: Yes Clinical Summary of Care: Patient Declined Electronic Signature(s) Signed: 06/15/2023 5:01:06 PM By: Samuella Bruin Entered By: Samuella Bruin on 06/15/2023 09:48:56 Lucarelli, Karin [name] ([id number]) [id number] Page 2 of 8 -------------------------------------------------------------------------------- Lower Extremity Assessment Details Patient Name: Date of Service: [name] [name] [name]. 06/15/2023 9:15 [name] Medical Record Number: [id number] Patient Account Number: [id number] Date of Birth/Sex: Treating RN: 24-Sep-1955 (67 y.o. [name] [name] Primary Care Carlisha Wisler: Arva Chafe Other Clinician: Referring Biannca Scantlin: Treating Joangel Vanosdol/Extender: [name] in Treatment: 41 Edema Assessment Assessed: [name]: No] [name]: No] Edema: [Left: Ye] [Right: s] Calf Left: Right: Point of Measurement: From Medial Instep 37.8 cm Ankle Left: Right: Point of Measurement: From Medial Instep 28 cm Vascular Assessment Pulses: Dorsalis Pedis Palpable: [Right:Yes] Extremity colors, hair growth, and conditions: Extremity Color: [Right:Normal] Hair Growth on Extremity: [Right:Yes] Temperature of Extremity: [Right:Warm] Capillary Refill: [Right:< 3 seconds] Dependent Rubor: [Right:No No] Electronic Signature(s) Signed: 06/15/2023 5:01:06 PM By: Samuella Bruin Entered By: Samuella Bruin on 06/15/2023 09:17:36 -------------------------------------------------------------------------------- Multi Wound Chart Details Patient Name: Date of  Service: [name] [name] [name] [name]. 06/15/2023 9:15 [name] Medical Record Number: [id number] Patient Account Number: [id number] Date of Birth/Sex: Treating RN: 12/02/1955 (67 y.o. 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 [name] Primary Etiology: Extremity Sleep Apnea, Arrhythmia, Coronary [name] [name] Comorbid History: Artery Disease, Deep Vein Thrombosis, Hypertension, Myocardial Infarction, Peripheral Arterial Disease, Type II Diabetes, Neuropathy 07/08/2022 [name] [name]  Date Acquired: 58 [name] [name] Weeks of Treatment: Open [name] [name] Wound Status: No [name] [name] Wound Recurrence: 1.3x0.5x0.1 [name] [name] Measurements L x W x D (cm) 0.511 [name] [name] [name] : rea 0.051 [name] [name] Volume (cm) : 80.00% [name] [name] % Reduction in [name] rea: 80.00% [name] [name] % Reduction in Volume: Grade 1 [name] [name] Classification: Medium [name] [name] Exudate [name] mount: Serosanguineous [name] [name] Exudate Type: red, brown [name] [name] Exudate Color: Distinct, outline attached [name] [name] Wound Margin: Medium (34-66%) [name] [name] Granulation [name] mount: Red [name] [name] Granulation Quality: Medium (34-66%) [name] [name] Necrotic [name] mount: Eschar [name] [name] Necrotic Tissue: Fat Layer (Subcutaneous Tissue): Yes [name] [name] Exposed Structures: Fascia: No Tendon: No Muscle: No Joint: No Bone: No Large (67-100%) [name] [name] Epithelialization: Debridement - Excisional [name] [name] Debridement: Pre-procedure Verification/Time Out 09:26 [name] [name] Taken: Lidocaine 4% T opical Solution [name] [name] Pain Control: Subcutaneous, Slough [name] [name] Tissue  Debrided: Skin/Subcutaneous Tissue [name] [name] Level: 0.51 [name] [name] Debridement [name] (sq cm): rea Curette [name] [name] Instrument: Moderate [name] [name] Bleeding: Silver Nitrate [name] [name] Hemostasis [name] chieved: Procedure was tolerated well [name] [name] Debridement Treatment Response: 1.3x0.5x0.1 [name] [name] Post Debridement Measurements L x W x D (cm) 0.051 [name] [name] Post Debridement Volume: (cm) Callus: Yes [name] [name] Periwound Skin Texture: Maceration: Yes [name] [name] Periwound Skin Moisture: Dry/Scaly: No No Abnormalities Noted [name] [name] Periwound Skin Color: No Abnormality [name] [name] Temperature: Debridement [name] [name] Procedures Performed: T Contact Cast otal Treatment Notes Wound #2 (Foot) Wound Laterality: Plantar, Right Cleanser Soap and Water Discharge Instruction: May shower and wash wound with dial antibacterial soap and water prior to dressing change. Wound Cleanser Discharge Instruction: Cleanse the wound with wound cleanser prior to applying [name] clean dressing using gauze sponges, not tissue or cotton balls. Peri-Wound Care Zinc Oxide Ointment 30g tube Discharge Instruction: Apply Zinc Oxide to periwound with each dressing change Topical Gentamicin Discharge Instruction: As directed by physician Mupirocin Ointment Discharge Instruction: Apply Mupirocin (Bactroban) as instructed [name] [name] [name] ([id number]) [id number] Page 4 of 8 Primary Dressing Maxorb Extra Ag+ Alginate Dressing, 2x2 (in/in) Discharge Instruction: Apply to wound bed as instructed Secondary Dressing Woven Gauze Sponge, Non-Sterile 4x4 in Discharge Instruction: Apply over primary dressing as directed. Zetuvit Plus 4x8 in Discharge Instruction: Apply over primary dressing as directed. Secured With [employer], 4.5x3.1 (in/yd) Discharge Instruction: Secure with Kerlix as directed. 61M Medipore H Soft Cloth Surgical T ape, 4 x 10 (in/yd) Discharge Instruction: Secure with tape as  directed. Compression Wrap Compression Stockings Add-Ons Electronic Signature(s) Signed: 06/15/2023 10:14:31 AM By: Duanne Guess MD FACS Entered By: Duanne Guess on 06/15/2023 10:14:31 -------------------------------------------------------------------------------- Multi-Disciplinary Care Plan Details Patient Name: Date of Service: [name] [name] [name] [name]. 06/15/2023 9:15 [name] Medical Record Number: [id number] Patient Account Number: [id number] Date of Birth/Sex: Treating RN: 03-03-1956 (68 y.o. [name] [name] Primary Care [name]: Arva Chafe Other Clinician: Referring Bryan Omura: Treating Libni Fusaro/Extender: [name] in Treatment: 64 Multidisciplinary Care Plan reviewed with physician Active Inactive Abuse / Safety / Falls / Self Care Management Nursing Diagnoses: Impaired physical mobility Potential for falls Goals: Patient will not experience any injury related to falls Date Initiated: 08/31/2022 Target Resolution Date: 07/16/2023 Goal Status: Active Patient/caregiver will verbalize/demonstrate measures taken to improve the patient's personal safety Date Initiated: 08/31/2022 Target Resolution Date: 07/16/2023 Goal Status: Active Interventions: Provide education on basic hygiene Provide education on fall prevention Notes: Wound/Skin Impairment Nursing Diagnoses: Impaired tissue integrity Knowledge deficit related  to ulceration/compromised skin integrity [name] [name] [name] ([id number]) [id number] Page 5 of 8 Goals: Patient/caregiver will verbalize understanding of skin care regimen Date Initiated: 08/31/2022 Target Resolution Date: 07/16/2023 Goal Status: Active Interventions: Assess patient/caregiver ability to obtain necessary supplies Assess patient/caregiver ability to perform ulcer/skin care regimen upon admission and as needed Assess ulceration(s) every visit Treatment Activities: Skin care regimen  initiated : 08/31/2022 Topical wound management initiated : 08/31/2022 Notes: Electronic Signature(s) Signed: 06/15/2023 5:01:06 PM By: Samuella Bruin Entered By: Samuella Bruin on 06/15/2023 09:48:08 -------------------------------------------------------------------------------- Pain Assessment Details Patient Name: Date of Service: [name] [name] [name] [name]. 06/15/2023 9:15 [name] Medical Record Number: [id number] Patient Account Number: [id number] Date of Birth/Sex: Treating RN: [birth year] (67 y.o. [name] [name] Primary Care Jivan Symanski: Arva Chafe Other Clinician: Referring Gianfranco Araki: Treating Malasia Torain/Extender: [name] in Treatment: 4 Active Problems Location of Pain Severity and Description of Pain Patient Has Paino No Site Locations Rate the pain. Current Pain Level: 0 Pain Management and Medication Current Pain Management: Electronic Signature(s) Signed: 06/15/2023 5:01:06 PM By: Samuella Bruin Entered By: Samuella Bruin on 06/15/2023 09:14:06 [name] [name] [name] ([id number]) [id number] Page 6 of 8 -------------------------------------------------------------------------------- Patient/Caregiver Education Details Patient Name: Date of Service: [name] [name] [name]. 11/26/2024andnbsp9:15 [name] Medical Record Number: [id number] Patient Account Number: [id number] Date of Birth/Gender: Treating RN: [birth year] (67 y.o. [name] [name] Primary Care Physician: Arva Chafe Other Clinician: Referring Physician: Treating Physician/Extender: [name] in Treatment: 51 Education Assessment Education Provided To: Patient Education Topics Provided Wound/Skin Impairment: Methods: Explain/Verbal Responses: Reinforcements needed, State content correctly Electronic Signature(s) Signed: 06/15/2023 5:01:06 PM By: Samuella Bruin Entered By: Samuella Bruin on 06/15/2023  09:48:18 -------------------------------------------------------------------------------- Wound Assessment Details Patient Name: Date of Service: [name] [name] [name] [name]. 06/15/2023 9:15 [name] Medical Record Number: [id number] Patient Account Number: [id number] Date of Birth/Sex: Treating RN: 1956-03-08 (67 y.o. [name] [name] Primary Care Naje Rice: Arva Chafe Other Clinician: Referring Hoover Grewe: Treating Abundio Teuscher/Extender: [name] in Treatment: 41 Wound Status Wound Number: 2 Primary Diabetic Wound/Ulcer of the Lower Extremity Etiology: Wound Location: Right, Plantar Foot Wound Open Wounding Event: Gradually Appeared Status: Date Acquired: 07/08/2022 Comorbid Sleep Apnea, Arrhythmia, Coronary Artery Disease, Deep Vein Weeks Of Treatment: 41 History: Thrombosis, Hypertension, Myocardial Infarction, Peripheral Arterial Clustered Wound: No Disease, Type II Diabetes, Neuropathy Photos Wound Measurements Length: (cm) 1. Width: (cm) 0. Ivens, [name] [name] ([id number]) Depth: (cm) Area: (cm) Volume: (cm) 3 % Reduction in Area: 80% 5 % Reduction in Volume: 80% [id number] Page 7 of 8 0.1 Epithelialization: Large (67-100%) 0.511 Tunneling: No 0.051 Undermining: No Wound Description Classification: Grade 1 Wound Margin: Distinct, outline attached Exudate Amount: Medium Exudate Type: Serosanguineous Exudate Color: red, brown Foul Odor After Cleansing: No Slough/Fibrino No Wound Bed Granulation Amount: Medium (34-66%) Exposed Structure Granulation Quality: Red Fascia Exposed: No Necrotic Amount: Medium (34-66%) Fat Layer (Subcutaneous Tissue) Exposed: Yes Necrotic Quality: Eschar Tendon Exposed: No Muscle Exposed: No Joint Exposed: No Bone Exposed: No Periwound Skin Texture Texture Color No Abnormalities Noted: No No Abnormalities Noted: Yes Callus: Yes Temperature / Pain Temperature: No Abnormality Moisture No  Abnormalities Noted: Yes Treatment Notes Wound #2 (Foot) Wound Laterality: Plantar, Right Cleanser Soap and Water Discharge Instruction: May shower and wash wound with dial antibacterial soap and water prior to dressing change. Wound Cleanser Discharge Instruction: Cleanse the wound with wound cleanser prior to applying [name] clean dressing using gauze sponges, not tissue or cotton balls. Peri-Wound Care Zinc Oxide Ointment 30g tube  Discharge Instruction: Apply Zinc Oxide to periwound with each dressing change Topical Gentamicin Discharge Instruction: As directed by physician Mupirocin Ointment Discharge Instruction: Apply Mupirocin (Bactroban) as instructed Primary Dressing Maxorb Extra Ag+ Alginate Dressing, 2x2 (in/in) Discharge Instruction: Apply to wound bed as instructed Secondary Dressing Woven Gauze Sponge, Non-Sterile 4x4 in Discharge Instruction: Apply over primary dressing as directed. Zetuvit Plus 4x8 in Discharge Instruction: Apply over primary dressing as directed. Secured With [employer], 4.5x3.1 (in/yd) Discharge Instruction: Secure with Kerlix as directed. 43M Medipore H Soft Cloth Surgical T ape, 4 x 10 (in/yd) Discharge Instruction: Secure with tape as directed. Compression Wrap Compression Stockings Add-Ons Electronic Signature(s) Signed: 06/15/2023 5:01:06 PM By: Samuella Bruin Entered By: Samuella Bruin on 06/15/2023 09:32:14 [name] [name] [name] ([id number]) [id number] Page 8 of 8 -------------------------------------------------------------------------------- Vitals Details Patient Name: Date of Service: [name] [name] [name]. 06/15/2023 9:15 [name] Medical Record Number: [id number] Patient Account Number: [id number] Date of Birth/Sex: Treating RN: 1955/10/06 (67 y.o. [name] [name] Primary Care Madine Sarr: Arva Chafe Other Clinician: Referring Kaithlyn Teagle: Treating Donyell Ding/Extender: [name] in Treatment: 41 Vital Signs Time Taken: 09:12 Temperature (F): 97.8 Height (in): 70 Pulse (bpm): 60 Weight (lbs): 260 Respiratory Rate (breaths/min): 18 Body Mass Index (BMI): 37.3 Blood Pressure (mmHg): 167/62 Reference Range: 80 - 120 mg / dl Electronic Signature(s) Signed: 06/15/2023 5:01:06 PM By: Samuella Bruin Entered By: Samuella Bruin on 06/15/2023 09:14:01

## 2023-06-16 NOTE — Progress Notes (Signed)
STINSON, RASER [name] ([id number]) [id number] Page 1 of 13 Visit Report for 06/15/2023 Chief Complaint Document Details Patient Name: Date of Service: [name]. 06/15/2023 9:15 [name] Medical Record Number: [id number] Patient Account Number: [id number] Date of Birth/Sex: Treating RN: Dec 12, 1955 (67 y.o. M) Primary Care Provider: Arva [name] Other Clinician: Referring Provider: Treating Provider/Extender: [name] in Treatment: 41 Information Obtained from: Patient Chief Complaint 05/14/2020; patient is here for review of an abrasion injury on the left lateral calf 08/31/2022: [name] (1st metatarsal base, plantar) Electronic Signature(s) Signed: 06/15/2023 10:17:06 AM By: Duanne Guess MD FACS Entered By: Duanne Guess on 06/15/2023 10:17:06 -------------------------------------------------------------------------------- Debridement Details Patient Name: Date of Service: [name], [name]. 06/15/2023 9:15 [name] Medical Record Number: [id number] Patient Account Number: [id number] Date of Birth/Sex: Treating RN: Sep 04, 1955 (67 y.o. [name] Primary Care Provider: Arva [name] Other Clinician: Referring Provider: Treating Provider/Extender: [name] in Treatment: 41 Debridement Performed for Assessment: Wound #2 Right,Plantar Foot Performed By: Physician Duanne Guess, MD The following information was scribed by: Samuella Bruin The information was scribed for: Duanne Guess Debridement Type: Debridement Severity of Tissue Pre Debridement: Fat layer exposed Level of Consciousness (Pre-procedure): Awake and Alert Pre-procedure Verification/Time Out Yes - 09:26 Taken: Start Time: 09:26 Pain Control: Lidocaine 4% T opical Solution Percent of Wound Bed Debrided: 100% T Area Debrided (cm): otal 0.51 Tissue and other material debrided: Non-Viable, Slough,  Subcutaneous, Slough Level: Skin/Subcutaneous Tissue Debridement Description: Excisional Instrument: Curette Specimen: Tissue Culture Number of Specimens T aken: 1 Bleeding: Moderate Hemostasis Achieved: Silver Nitrate Response to Treatment: Procedure was tolerated well Level of Consciousness (Post- Awake and Alert procedure): Post Debridement Measurements of Total Wound Length: (cm) 1.3 Width: (cm) 0.5 [name] ([id number]) [id number] Page 2 of 13 Depth: (cm) 0.1 Volume: (cm) 0.051 Character of Wound/Ulcer Post Debridement: Improved Severity of Tissue Post Debridement: Fat layer exposed Post Procedure Diagnosis Same as Pre-procedure Electronic Signature(s) Signed: 06/15/2023 10:29:44 AM By: Duanne Guess MD FACS Signed: 06/15/2023 5:01:06 PM By: Samuella Bruin Entered By: Samuella Bruin on 06/15/2023 09:52:42 -------------------------------------------------------------------------------- HPI Details Patient Name: Date of Service: [name], [name]. 06/15/2023 9:15 [name] Medical Record Number: [id number] Patient Account Number: [id number] Date of Birth/Sex: Treating RN: 1955-10-22 (67 y.o. M) Primary Care Provider: Arva [name] Other Clinician: Referring Provider: Treating Provider/Extender: [name] in Treatment: 80 History of Present Illness HPI Description: ADMISSION 05/14/2020; this is [name] with multiple medical issues. Predominantly he has type 2 diabetes with [name] and also history of fairly significant PAD. He had [name] in February 2017 he also had an atherectomy and angioplasty by Dr. [name] of the right popliteal artery in 2016. He is supposed to be getting arterial studies annually however this was interrupted last year because of the pandemic. He tells [place] he was at [facility] 2 weeks ago was  getting out of of the scooter and traumatized his left lateral lower leg. There was [name] as the patient is on Plavix and Eliquis. They have been dressing this with Neosporin and doing [name] fairly good job. Wound measures 2.5 x 3.5 it does not have any depth he does not have [name] in his legs outside of surgery however he does have chronic edema and skin changes suggestive of chronic venous disease possibly some degree of lymphedema as well. Past medical  history includes type 2 diabetes with peripheral neuropathy and gait instability, lumbar spondylosis, obstructive sleep apnea, history of [name], basal cell skin cancer, atrial fibrillation on anticoagulation, significant PAD as noted with [name] and [name] in 30th 2016. He also has [name] of coronary artery disease with an MI in 2002 hypertension hyperlipidemia and heart failure with preserved ejection fraction His last arterial studies I can see in epic were on 03/10/2018 this showed [name] and [name] with monophasic waveforms on the right. On the left his ABI was 1.20 with [name] and triphasic waveforms. He has not had arterial studies since. Our nurse in the clinic got an ABI on the left of 1.1 11/2; left anterior leg wound in the setting of chronic venous insufficiency. Wound was initially trauma. We have been using Hydrofera Blue under compression he has home health.. The wound looks [name] lot better today with improvement in surface area 11/16; left anterior leg wound in the setting of chronic venous insufficiency. Wound was initially trauma. We have been using Hydrofera Blue under compression. The patient is closed today. He is supposed to follow-up with vein and vascular with regards to arterial insufficiency nevertheless his leg wounds are 12/3; apparently 2 weeks ago  when they were putting on their stockings they managed to get 3 wounds on the left anterior lower leg from abrasion when putting on the stockings. Home health came by the week of Thanksgiving and put Hydrofera Blue 4-layer wrap on this and there is only one superficial area remaining. The patient and his wife complained about the difficulties getting stockings on I think we are using 20/30. We will order bilateral external compression stockings which should be easier. 12/10; wound on the left anterior lower leg is closed. He has chronic venous insufficiency we ordered him Farrow wrap stockings unfortunately he did not bring these in. 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 He says that it has been present since mid December. He is currently residing in [facility] until March and [name] podiatrist has been looking after him there. They have been simply painting the area with Betadine. He has not had any lower extremity arterial studies since 2019, at which time his right ABI was 0.69. Measured in clinic today, it was 0.71. He is not aware of his most recent hemoglobin A1c, but historically he has had exceptionally poor control. On the basis of his right first metatarsal, there is [name] small crescent shaped wound. There is surrounding eschar and callus. There is no malodor or purulent drainage. 09/08/2022: The original wound is smaller today and fairly clean, but there is some discoloration and [name] pulpy texture to the adjacent callus. Underneath this, the tissue is open exposing the fat layer. It looks like perhaps there was [name] crack in the callus and moisture got under the skin and caused breakdown. 09/15/2022: There has been more moisture related tissue breakdown. The callus is very soft and the underlying tissue is more open. 09/28/2022: The wound looks much better. He has done [name] good job keeping it dry. There is some callus overlying much  of the wound surface. There is slough on the exposed open areas. [name], [name] ([id number]) [id number] Page 3 of 13 10/14/2022: There has been [name] lot  of moisture related tissue breakdown. He is still forming callus over the top and then it seems that moisture gets underneath the callus and causes tissue damage. There is slough on the exposed wound surface. [place] will [place] the [place] this [place]. 10/21/2022: His foot is less wet, but there is no significant change to his wound. He has developed [name] blister on his left anterior tibial surface. There is no open wound here, but he does have some fairly significant edema. We are planning to apply [name] total contact cast today. 10/23/2022: Here for his obligatory first cast change. He says he has not had any issues wearing the cast or walking in the boot. No detrimental effects on his wound. 10/29/2022: The wound is looking better. Clearly, putting him in the cast has prevented water from getting in under his callus and causing further tissue breakdown. The blister on his left anterior tibial surface has not yet ruptured. Edema control is significantly improved. 11/05/2022: The wound on his right plantar foot is getting better. There is still some callus accumulation around the wounds, but there are just 2 open areas now, [name] very small 1 on the medial aspect of his metatarsal head and [name] little bit larger 1 on the plantar surface. The blister on his left anterior tibial surface is now open with hanging dry skin. 11/12/2022: The left anterior tibial wound is closed. The wound on his right plantar foot is smaller with just [name] little bit of callus around the edges. 11/19/2022: The right plantar foot wound continues to contract. The surface is quite clean. The tiny satellite area on the medial aspect of his foot has closed. 11/26/2022: The wound is smaller again today. He is responding well to the offloading effects of  total contact casting. 12/03/2022: The wound is about the same size today. There is [name] little bit of moisture around the perimeter of the wound. No significant tissue breakdown. 12/10/2022: The wound is smaller today. There has been no moisture-related tissue breakdown of any maceration. 12/21/2022: The wound continues to contract. Moisture control is excellent. 12/28/2022: The wound is [name] little bit smaller today. 01/05/2023: The wound measures smaller today. Excellent moisture control with zero evidence of maceration. The wound surface is clean with healthy granulation tissue. 01/13/2023: We are leaving his Apligraf in situ for [name] second week. There is no discolored drainage or malodor coming from the site. 01/19/2023: The wound is smaller today. The periwound skin is in good condition without tissue maceration and the healed areas of tissue appear to have improved integrity. 01/27/2023: Continued contraction of the wound. No tissue maceration. The wound surface is flush with the surrounding skin. 02/02/2023: The wound measured [name] little bit smaller today. There is some bruising just adjacent to the wound, but this has not resulted in any tissue breakdown. 02/09/2023: The wound is smaller again today. The area of bruising that was seen last week has resolved and there has been no tissue damage as [name] result. There is some callus accumulation around the wound edges along with minimal soft slough on the surface. 02/16/2023: The wound measured [name] little bit larger today. There is [name] little bit of maceration around the wound edges, which is likely responsible. 02/23/2023: The wound is smaller today and there is epithelium encroaching around the edges. There is no periwound tissue maceration, nor has he accumulated any significant callus. 03/03/2023: His wound continues to contract. There is no maceration nor any significant callus.  There is slight slough on the wound surface. 03/09/2023: The wound is [name] little bit smaller  again today. He is managing very well in the peg assist offloading insert and there has been no tissue breakdown or periwound maceration as [name] result of being out of the total contact cast. 03/16/2023: The wound measured smaller again today. He continues to do well using the peg assist offloading insert. 03/23/2023: Although the wound measured the same size today, it visually appears smaller to me. There is good granulation tissue with minimal slough. There is [name] little callus around the edges. 03/30/2023: The wound is smaller today. The granulation tissue is healthy. There is [name] little bit of callus accumulation around the edges. 04/06/2023: Unfortunately, he did not have any assistance to change his dressing during the week and he was also walking substantially more due to attending [name] family event. This has resulted in fairly significant breakdown of his wound. Much of this seems to be related to moisture accumulation. 04/13/2023: The wound looks markedly better this week after being in the total contact cast. There is no moisture-related tissue breakdown, and the wound opening is smaller. 04/20/2023: The wound is smaller again today. The periwound skin is nice and dry and there is no concern for infection. 04/27/2023: The wound continues to contract. The surface is clean. There is [name] little bit of periwound moisture, but the wound itself has not broken down at all. 05/04/2023: The wound is stable this week. He has had an increase in his left lower leg swelling, however, and he has an intact blister on the distal medial aspect of the leg, adjacent to where his saphenous vein was harvested. 05/11/2023: The wound is [name] little bit narrower from 12-6 o'clock but slightly wider from 9-3 o'clock. It is otherwise fairly clean. The blister on his left lower leg is still present but flat. Edema is improved. 05/18/2023: The blister on his left lower leg has resolved. The wound on his right first metatarsal head is  smaller today. There is [name] little bit of slough, callus, and eschar present. Moisture balance is good. 05/25/2023: His first metatarsal head wound is down to just [name] narrow slit. No concern for infection. 06/01/2023: The wound has contracted further. There is [name] little bit of periwound dry skin and callus. 06/08/2023: The wound is down to just [name] couple of millimeters. There is minimal dry skin and callus around the edges. No tissue maceration. 06/15/2023: The wound surface is covered over with dry skin and callus. Unfortunately, as I began to debride this, purulent drainage emerged. There is no surrounding erythema or induration, nor is there any malodor. Electronic Signature(s) Goodridge, Atley [name] ([id number]) [id number] Page 4 of 13 Signed: 06/15/2023 10:17:42 AM By: Duanne Guess MD FACS Entered By: Duanne Guess on 06/15/2023 10:17:42 -------------------------------------------------------------------------------- Physical Exam Details Patient Name: Date of Service: [name], [name]. 06/15/2023 9:15 [name] Medical Record Number: [id number] Patient Account Number: [id number] Date of Birth/Sex: Treating RN: May 07, 1956 (67 y.o. M) Primary Care Provider: Arva [name] Other Clinician: Referring Provider: Treating Provider/Extender: [name] in Treatment: 41 Constitutional Hypertensive, asymptomatic. . . . no acute distress. Respiratory Normal work of breathing on room air.. Notes 06/15/2023: The wound surface is covered over with dry skin and callus. Unfortunately, as I began to debride this, purulent drainage emerged. There is no surrounding erythema or induration, nor is there any malodor. Electronic Signature(s) Signed: 06/15/2023 10:18:16 AM By: Duanne Guess MD FACS Entered By: Duanne Guess  on 06/15/2023 10:18:16 -------------------------------------------------------------------------------- Physician Orders  Details Patient Name: Date of Service: [name], [name]. 06/15/2023 9:15 [name] Medical Record Number: [id number] Patient Account Number: [id number] Date of Birth/Sex: Treating RN: 17-Apr-1956 (67 y.o. [name] Primary Care Provider: Arva [name] Other Clinician: Referring Provider: Treating Provider/Extender: [name] in Treatment: 41 The following information was scribed by: Samuella Bruin The information was scribed for: Duanne Guess Verbal / Phone Orders: No Diagnosis Coding ICD-10 Coding Code Description [phone] Non-pressure chronic ulcer of other part of right foot with fat layer exposed E11.621 Type 2 diabetes mellitus with foot ulcer I73.9 Peripheral vascular disease, unspecified R60.0 Localized edema I25.10 Atherosclerotic heart disease of native coronary artery without angina pectoris I10 Essential (primary) hypertension E11.65 Type 2 diabetes mellitus with hyperglycemia E11.40 Type 2 diabetes mellitus with diabetic neuropathy, unspecified I63.9 Cerebral infarction, unspecified Follow-up Appointments ppointment in 1 week. - Dr. Lady Gary - room 2 TCC Return [name] Anesthetic Ontiveros, Aleksandar [name] ([id number]) [id number] Page 5 of 13 (In clinic) Topical Lidocaine 4% applied to wound bed Bathing/ Shower/ Hygiene May shower with protection but do not get wound dressing(s) wet. Protect dressing(s) with water repellant cover (for example, large plastic bag) or [name] cast cover and may then take shower. Off-Loading Total Contact Cast to Right Lower Extremity Removable cast walker boot to: - right leg Non Wound Condition Left Lower Extremity Other Non Wound Condition Orders/Instructions: - compression wrap urgo lite to left leg Wound Treatment Wound #2 - Foot Wound Laterality: Plantar, Right Cleanser: Soap and Water 1 x Per Week/30 [name] Discharge Instructions: May shower and wash wound with dial antibacterial  soap and water prior to dressing change. Cleanser: Wound Cleanser 1 x Per Week/30 [name] Discharge Instructions: Cleanse the wound with wound cleanser prior to applying [name] clean dressing using gauze sponges, not tissue or cotton balls. Peri-Wound Care: Zinc Oxide Ointment 30g tube 1 x Per Week/30 [name] Discharge Instructions: Apply Zinc Oxide to periwound with each dressing change Topical: Gentamicin 1 x Per Week/30 [name] Discharge Instructions: As directed by physician Topical: Mupirocin Ointment 1 x Per Week/30 [name] Discharge Instructions: Apply Mupirocin (Bactroban) as instructed Prim Dressing: Maxorb Extra Ag+ Alginate Dressing, 2x2 (in/in) 1 x Per Week/30 [name] ary Discharge Instructions: Apply to wound bed as instructed Secondary Dressing: Woven Gauze Sponge, Non-Sterile 4x4 in 1 x Per Week/30 [name] Discharge Instructions: Apply over primary dressing as directed. Secondary Dressing: Zetuvit Plus 4x8 in 1 x Per Week/30 [name] Discharge Instructions: Apply over primary dressing as directed. Secured With: [employer], 4.5x3.1 (in/yd) 1 x Per Week/30 [name] Discharge Instructions: Secure with Kerlix as directed. Secured With: 26M Medipore H Soft Cloth Surgical T ape, 4 x 10 (in/yd) 1 x Per Week/30 [name] Discharge Instructions: Secure with tape as directed. Laboratory naerobe culture (MICRO) - PCR of nonhealing wound to right foot Bacteria identified in Unspecified specimen by [name] LOINC Code: 635-3 Convenience Name: Anaerobic culture Patient Medications llergies: No Known Drug Allergies [name] Notifications Medication Indication Start End 06/15/2023 lidocaine DOSE topical 4 % cream - cream topical Electronic Signature(s) Signed: 06/15/2023 10:29:44 AM By: Duanne Guess MD FACS Entered By: Duanne Guess on 06/15/2023 10:18:38 Sustaita, Kordel [name] ([id number]) [id number] Page 6 of  13 -------------------------------------------------------------------------------- Problem List Details Patient Name: Date of Service: [name], [name]. 06/15/2023 9:15 [name] Medical Record Number: [id number] Patient Account Number: [id number] Date of Birth/Sex: Treating RN: [birth year] (67 y.o. M) Primary Care Provider: Arva [name] Other Clinician: Referring Provider: Treating  Provider/Extender: Ocie Doyne Weeks in Treatment: [phone] Active Problems ICD-10 Encounter Code Description Active Date MDM Diagnosis L97.512 Non-pressure chronic ulcer of other part of right foot with fat layer exposed 08/31/2022 No Yes E11.621 Type 2 diabetes mellitus with foot ulcer 08/31/2022 No Yes I73.9 Peripheral vascular disease, unspecified 08/31/2022 No Yes R60.0 Localized edema 05/04/2023 No Yes I25.10 Atherosclerotic heart disease of native coronary artery without angina pectoris 08/31/2022 No Yes I10 Essential (primary) hypertension 08/31/2022 No Yes E11.65 Type 2 diabetes mellitus with hyperglycemia 08/31/2022 No Yes E11.40 Type 2 diabetes mellitus with diabetic neuropathy, unspecified 08/31/2022 No Yes I63.9 Cerebral infarction, unspecified 08/31/2022 No Yes Inactive Problems Resolved Problems ICD-10 Code Description Active Date Resolved Date L97.821 Non-pressure chronic ulcer of other part of left lower leg limited to breakdown of skin 11/05/2022 11/05/2022 Electronic Signature(s) Signed: 06/15/2023 10:14:21 AM By: Duanne Guess MD FACS Entered By: Duanne Guess on 06/15/2023 10:14:21 [name], [name] ([id number]) [id number] Page 7 of 13 -------------------------------------------------------------------------------- Progress Note Details Patient Name: Date of Service: [name], [name]. 06/15/2023 9:15 [name] Medical Record Number: [id number] Patient Account Number: [id number] Date of Birth/Sex: Treating RN: October 15, 1955 (67 y.o. M) Primary Care Provider:  Arva [name] Other Clinician: Referring Provider: Treating Provider/Extender: [name] in Treatment: 41 Subjective Chief Complaint Information obtained from Patient 05/14/2020; patient is here for review of an abrasion injury on the left lateral calf 08/31/2022: [name] (1st metatarsal base, plantar) History of Present Illness (HPI) ADMISSION 05/14/2020; this is [name] with multiple medical issues. Predominantly he has type 2 diabetes with [name] and also history of fairly significant PAD. He had [name] in February 2017 he also had an atherectomy and angioplasty by Dr. [name] of the right popliteal artery in 2016. He is supposed to be getting arterial studies annually however this was interrupted last year because of the pandemic. He tells [place] he was at [facility] 2 weeks ago was getting out of of the scooter and traumatized his left lateral lower leg. There was [name] as the patient is on Plavix and Eliquis. They have been dressing this with Neosporin and doing [name] fairly good job. Wound measures 2.5 x 3.5 it does not have any depth he does not have [name] in his legs outside of surgery however he does have chronic edema and skin changes suggestive of chronic venous disease possibly some degree of lymphedema as well. Past medical history includes type 2 diabetes with peripheral neuropathy and gait instability, lumbar spondylosis, obstructive sleep apnea, history of [name], basal cell skin cancer, atrial fibrillation on anticoagulation, significant PAD as noted with [name] and [name] in 30th 2016. He also has [name] of coronary artery disease with an MI in 2002 hypertension hyperlipidemia and heart failure with  preserved ejection fraction His last arterial studies I can see in epic were on 03/10/2018 this showed [name] and [name] with monophasic waveforms on the right. On the left his ABI was 1.20 with [name] and triphasic waveforms. He has not had arterial studies since. Our nurse in the clinic got an ABI on the left of 1.1 11/2; left anterior leg wound in the setting of chronic venous insufficiency. Wound was initially trauma. We  have been using Hydrofera Blue under compression he has home health.. The wound looks [name] lot better today with improvement in surface area 11/16; left anterior leg wound in the setting of chronic venous insufficiency. Wound was initially trauma. We have been using Hydrofera Blue under compression. The patient is closed today. He is supposed to follow-up with vein and vascular with regards to arterial insufficiency nevertheless his leg wounds are 12/3; apparently 2 weeks ago when they were putting on their stockings they managed to get 3 wounds on the left anterior lower leg from abrasion when putting on the stockings. Home health came by the week of Thanksgiving and put Hydrofera Blue 4-layer wrap on this and there is only one superficial area remaining. The patient and his wife complained about the difficulties getting stockings on I think we are using 20/30. We will order bilateral external compression stockings which should be easier. 12/10; wound on the left anterior lower leg is closed. He has chronic venous insufficiency we ordered him Farrow wrap stockings unfortunately he did not bring these in. 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 He says that it has been present since mid December. He is currently residing in [facility] until March and [name] podiatrist has been looking after him there. They have been simply painting the area with Betadine. He has not had any lower  extremity arterial studies since 2019, at which time his right ABI was 0.69. Measured in clinic today, it was 0.71. He is not aware of his most recent hemoglobin A1c, but historically he has had exceptionally poor control. On the basis of his right first metatarsal, there is [name] small crescent shaped wound. There is surrounding eschar and callus. There is no malodor or purulent drainage. 09/08/2022: The original wound is smaller today and fairly clean, but there is some discoloration and [name] pulpy texture to the adjacent callus. Underneath this, the tissue is open exposing the fat layer. It looks like perhaps there was [name] crack in the callus and moisture got under the skin and caused breakdown. 09/15/2022: There has been more moisture related tissue breakdown. The callus is very soft and the underlying tissue is more open. 09/28/2022: The wound looks much better. He has done [name] good job keeping it dry. There is some callus overlying much of the wound surface. There is slough on the exposed open areas. 10/14/2022: There has been [name] lot of moisture related tissue breakdown. He is still forming callus over the top and then it seems that moisture gets underneath the callus and causes tissue damage. There is slough on the exposed wound surface. [place]. 10/21/2022: His foot is less wet, but there is no significant change to his wound. He has developed [name] blister on his left anterior tibial surface. There is no open wound here, but he does have some fairly significant edema. We are planning to apply [name] total contact cast today. 10/23/2022: Here for his obligatory first cast change. He says he has not had any issues wearing the cast or walking in the boot. No detrimental effects on his wound. 10/29/2022: The wound is looking better. Clearly, putting him in the cast has prevented water from getting in under his callus and causing further tissue breakdown. The  blister on his left anterior tibial surface has not yet ruptured. Edema control is significantly improved. 11/05/2022: The  wound on his right plantar foot is getting better. There is still some callus accumulation around the wounds, but there are just 2 open areas now, [name] very small 1 on the medial aspect of his metatarsal head and [name] little bit larger 1 on the plantar surface. The blister on his left anterior tibial surface is now open with hanging dry skin. 11/12/2022: The left anterior tibial wound is closed. The wound on his right plantar foot is smaller with just [name] little bit of callus around the edges. 11/19/2022: The right plantar foot wound continues to contract. The surface is quite clean. The tiny satellite area on the medial aspect of his foot has closed. 11/26/2022: The wound is smaller again today. He is responding well to the offloading effects of total contact casting. [name], [name] ([id number]) [id number] Page 8 of 13 12/03/2022: The wound is about the same size today. There is [name] little bit of moisture around the perimeter of the wound. No significant tissue breakdown. 12/10/2022: The wound is smaller today. There has been no moisture-related tissue breakdown of any maceration. 12/21/2022: The wound continues to contract. Moisture control is excellent. 12/28/2022: The wound is [name] little bit smaller today. 01/05/2023: The wound measures smaller today. Excellent moisture control with zero evidence of maceration. The wound surface is clean with healthy granulation tissue. 01/13/2023: We are leaving his Apligraf in situ for [name] second week. There is no discolored drainage or malodor coming from the site. 01/19/2023: The wound is smaller today. The periwound skin is in good condition without tissue maceration and the healed areas of tissue appear to have improved integrity. 01/27/2023: Continued contraction of the wound. No tissue maceration. The wound surface is flush with the  surrounding skin. 02/02/2023: The wound measured [name] little bit smaller today. There is some bruising just adjacent to the wound, but this has not resulted in any tissue breakdown. 02/09/2023: The wound is smaller again today. The area of bruising that was seen last week has resolved and there has been no tissue damage as [name] result. There is some callus accumulation around the wound edges along with minimal soft slough on the surface. 02/16/2023: The wound measured [name] little bit larger today. There is [name] little bit of maceration around the wound edges, which is likely responsible. 02/23/2023: The wound is smaller today and there is epithelium encroaching around the edges. There is no periwound tissue maceration, nor has he accumulated any significant callus. 03/03/2023: His wound continues to contract. There is no maceration nor any significant callus. There is slight slough on the wound surface. 03/09/2023: The wound is [name] little bit smaller again today. He is managing very well in the peg assist offloading insert and there has been no tissue breakdown or periwound maceration as [name] result of being out of the total contact cast. 03/16/2023: The wound measured smaller again today. He continues to do well using the peg assist offloading insert. 03/23/2023: Although the wound measured the same size today, it visually appears smaller to me. There is good granulation tissue with minimal slough. There is [name] little callus around the edges. 03/30/2023: The wound is smaller today. The granulation tissue is healthy. There is [name] little bit of callus accumulation around the edges. 04/06/2023: Unfortunately, he did not have any assistance to change his dressing during the week and he was also walking substantially more due to attending [name] family event. This has resulted in fairly significant breakdown of his wound. Much of this seems to be  related to moisture accumulation. 04/13/2023: The wound looks markedly better this week after  being in the total contact cast. There is no moisture-related tissue breakdown, and the wound opening is smaller. 04/20/2023: The wound is smaller again today. The periwound skin is nice and dry and there is no concern for infection. 04/27/2023: The wound continues to contract. The surface is clean. There is [name] little bit of periwound moisture, but the wound itself has not broken down at all. 05/04/2023: The wound is stable this week. He has had an increase in his left lower leg swelling, however, and he has an intact blister on the distal medial aspect of the leg, adjacent to where his saphenous vein was harvested. 05/11/2023: The wound is [name] little bit narrower from 12-6 o'clock but slightly wider from 9-3 o'clock. It is otherwise fairly clean. The blister on his left lower leg is still present but flat. Edema is improved. 05/18/2023: The blister on his left lower leg has resolved. The wound on his right first metatarsal head is smaller today. There is [name] little bit of slough, callus, and eschar present. Moisture balance is good. 05/25/2023: His first metatarsal head wound is down to just [name] narrow slit. No concern for infection. 06/01/2023: The wound has contracted further. There is [name] little bit of periwound dry skin and callus. 06/08/2023: The wound is down to just [name] couple of millimeters. There is minimal dry skin and callus around the edges. No tissue maceration. 06/15/2023: The wound surface is covered over with dry skin and callus. Unfortunately, as I began to debride this, purulent drainage emerged. There is no surrounding erythema or induration, nor is there any malodor. Patient History Information obtained from Patient. Family History Unknown History. Social History Never smoker, Marital Status - Single, Alcohol Use - Never, Drug Use - No History, Caffeine Use - Rarely. Medical History Respiratory Patient has history of Sleep Apnea Cardiovascular Patient has history of Arrhythmia -  [name]-Fib, Coronary Artery Disease - s/p CABG, Deep Vein Thrombosis, Hypertension, Myocardial Infarction, Peripheral Arterial Disease - s/p Fempop x 2 Endocrine Patient has history of Type II Diabetes Neurologic Patient has history of Neuropathy Hospitalization/Surgery History - colonoscopy. - polypectomy. - peripheral vascular cath. - shoulder arthroscopy. - carpal tunnel release. - coronary artery bypass. - appendectomy. - cardiac cath. - coronary angioplasty. - thrombectomy. - knee arthroplasty. - popliteal artery stent. 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 no acute distress. Vitals Time Taken: 9:12 AM, Height: 70 in, Weight: 260 lbs, BMI: 37.3, Temperature: 97.8 F, Pulse: 60 bpm, Respiratory Rate: 18 breaths/min, Blood Pressure: 167/62 mmHg. Respiratory Normal work of breathing on room air.. General Notes: 06/15/2023: The wound surface is covered over with dry skin and callus. Unfortunately, as I began to debride this, purulent drainage emerged. There is no surrounding erythema or induration, nor is there any malodor. Integumentary (Hair, Skin) Wound #2 status is Open. Original cause of wound was Gradually Appeared. The date acquired was: 07/08/2022. The wound has been in treatment 41 weeks. The wound is located on the Right,Plantar Foot. The wound measures 1.3cm length x 0.5cm width x 0.1cm depth; 0.511cm^2 area and 0.051cm^3 volume. There is Fat Layer (Subcutaneous Tissue) exposed. There is no tunneling or undermining noted. There is [name] medium amount of serosanguineous drainage noted. The wound margin is distinct with the outline attached to the  wound base. There is medium (34-66%) red granulation within the wound bed. There is [name] medium (34- 66%) amount  of necrotic tissue within the wound bed including Eschar. The periwound skin appearance had no abnormalities noted for moisture. The periwound skin appearance had no abnormalities noted for color. The periwound skin appearance exhibited: Callus. Periwound temperature was noted as No Abnormality. Assessment Active Problems ICD-10 Non-pressure chronic ulcer of other part of right foot with fat layer exposed Type 2 diabetes mellitus with foot ulcer Peripheral vascular disease, unspecified Localized edema Atherosclerotic heart disease of native coronary artery without angina pectoris Essential (primary) hypertension Type 2 diabetes mellitus with hyperglycemia Type 2 diabetes mellitus with diabetic neuropathy, unspecified Cerebral infarction, unspecified Procedures Wound #2 Pre-procedure diagnosis of Wound #2 is [name] Diabetic Wound/Ulcer of the Lower Extremity located on the Right,Plantar Foot .Severity of Tissue Pre Debridement is: Fat layer exposed. There was [name] Excisional Skin/Subcutaneous Tissue Debridement with [name] total area of 0.51 sq cm performed by Duanne Guess, MD. With the following instrument(s): Curette to remove Non-Viable tissue/material. Material removed includes Subcutaneous Tissue and Slough and after achieving pain control using Lidocaine 4% T opical Solution. 1 specimen was taken by [name] Tissue Culture and sent to the lab per facility protocol. [name] time out was conducted at 09:26, prior to the start of the procedure. [name] Moderate amount of bleeding was controlled with Silver Nitrate. The procedure was tolerated well. Post Debridement Measurements: 1.3cm length x 0.5cm width x 0.1cm depth; 0.051cm^3 volume. Character of Wound/Ulcer Post Debridement is improved. Severity of Tissue Post Debridement is: Fat layer exposed. Post procedure Diagnosis Wound #2: Same as Pre-Procedure Pre-procedure diagnosis of Wound #2 is [name] Diabetic Wound/Ulcer of the Lower Extremity located on the Right,Plantar  Foot . There was [name] T Contact Cast otal Procedure by Duanne Guess, MD. Post procedure Diagnosis Wound #2: Same as Pre-Procedure [name], [name] ([id number]) [id number] Page 10 of 13 Plan Follow-up Appointments: Return Appointment in 1 week. - Dr. Lady Gary - room 2 TCC Anesthetic: (In clinic) Topical Lidocaine 4% applied to wound bed Bathing/ Shower/ Hygiene: May shower with protection but do not get wound dressing(s) wet. Protect dressing(s) with water repellant cover (for example, large plastic bag) or [name] cast cover and may then take shower. Off-Loading: T Contact Cast to Right Lower Extremity otal Removable cast walker boot to: - right leg Non Wound Condition: Other Non Wound Condition Orders/Instructions: - compression wrap urgo lite to left leg Laboratory ordered were: Anaerobic culture - PCR of nonhealing wound to right foot The following medication(s) was prescribed: lidocaine topical 4 % cream cream topical was prescribed at facility WOUND #2: - Foot Wound Laterality: Plantar, Right Cleanser: Soap and Water 1 x Per Week/30 [name] Discharge Instructions: May shower and wash wound with dial antibacterial soap and water prior to dressing change. Cleanser: Wound Cleanser 1 x Per Week/30 [name] Discharge Instructions: Cleanse the wound with wound cleanser prior to applying [name] clean dressing using gauze sponges, not tissue or cotton balls. Peri-Wound Care: Zinc Oxide Ointment 30g tube 1 x Per Week/30 [name] Discharge Instructions: Apply Zinc Oxide to periwound with each dressing change Topical: Gentamicin 1 x Per Week/30 [name] Discharge Instructions: As directed by physician Topical: Mupirocin Ointment 1 x Per Week/30 [name] Discharge Instructions: Apply Mupirocin (Bactroban) as instructed Prim Dressing: Maxorb Extra Ag+ Alginate Dressing, 2x2 (in/in) 1 x Per Week/30 [name] ary Discharge Instructions: Apply to wound bed as instructed Secondary Dressing: Woven Gauze  Sponge, Non-Sterile 4x4 in 1 x  Per Week/30 [name] Discharge Instructions: Apply over primary dressing as directed. Secondary Dressing: Zetuvit Plus 4x8 in 1 x Per Week/30 [name] Discharge Instructions: Apply over primary dressing as directed. Secured With: [employer], 4.5x3.1 (in/yd) 1 x Per Week/30 [name] Discharge Instructions: Secure with Kerlix as directed. Secured With: 37M Medipore H Soft Cloth Surgical T ape, 4 x 10 (in/yd) 1 x Per Week/30 [name] Discharge Instructions: Secure with tape as directed. 06/15/2023: The wound surface is covered over with dry skin and callus. Unfortunately, as I began to debride this, purulent drainage emerged. There is no surrounding erythema or induration, nor is there any malodor. I took [name] culture of the purulent material. I then used [name] curette to debride slough, callus, dry skin, and subcutaneous tissue from the wound. The wound itself actually looks quite good and I wonder if this may be [name] sterile abscess. Nonetheless, we will continue topical gentamicin and mupirocin with silver alginate. [name] total contact cast was applied. Once culture data return, I will make appropriate antibiotic interventions as indicated. Follow-up in 1 week. Electronic Signature(s) Signed: 06/15/2023 10:19:28 AM By: Duanne Guess MD FACS Entered By: Duanne Guess on 06/15/2023 10:19:28 -------------------------------------------------------------------------------- HxROS Details Patient Name: Date of Service: [name], [name]. 06/15/2023 9:15 [name] Medical Record Number: [id number] Patient Account Number: [id number] Date of Birth/Sex: Treating RN: February 25, 1956 (67 y.o. M) Primary Care Provider: Arva [name] Other Clinician: Referring Provider: Treating Provider/Extender: [name] in Treatment: 41 Information Obtained From Patient Respiratory Medical History: Positive for: Sleep Apnea Cardiovascular Rabadi, [name] ([id number])  [id number] Page 11 of 13 Medical History: Positive for: Arrhythmia - [name]-Fib; Coronary Artery Disease - s/p CABG; Deep Vein Thrombosis; Hypertension; Myocardial Infarction; Peripheral Arterial Disease - s/p Fempop x 2 Past Medical History Notes: CVA x 3 Endocrine Medical History: Positive for: Type II Diabetes Time with diabetes: since 1997 Treated with: Insulin, Oral agents Blood sugar tested every day: Yes Tested : 2-3x per day Musculoskeletal Medical History: Past Medical History Notes: carpal tunnel syndrome Neurologic Medical History: Positive for: Neuropathy Past Medical History Notes: stroke Oncologic Medical History: Past Medical History Notes: skin cancer Immunizations Pneumococcal Vaccine: Received Pneumococcal Vaccination: Yes Received Pneumococcal Vaccination On or After 60th Birthday: Yes Implantable Devices None Hospitalization / Surgery History Type of Hospitalization/Surgery colonoscopy polypectomy peripheral vascular cath shoulder arthroscopy carpal tunnel release coronary artery bypass appendectomy cardiac cath coronary angioplasty thrombectomy knee arthroplasty popliteal artery stent Family and Social History Unknown History: Yes; Never smoker; Marital Status - Single; Alcohol Use: Never; Drug Use: No History; Caffeine Use: Rarely; Financial Concerns: No; Food, Clothing or Shelter Needs: No; Support System Lacking: No; Transportation Concerns: No Electronic Signature(s) Signed: 06/15/2023 10:29:44 AM By: Duanne Guess MD FACS Entered By: Duanne Guess on 06/15/2023 10:17:52 -------------------------------------------------------------------------------- Total Contact Cast Details Patient Name: Date of Service: [name], [name]. 06/15/2023 9:15 [name] Renee Harder, Mechel [name] ([id number]) [id number] Page 12 of 13 Medical Record Number: [id number] Patient Account Number: [id number] Date of Birth/Sex:  Treating RN: 02-07-1956 (67 y.o. [name] Primary Care Provider: Arva [name] Other Clinician: Referring Provider: Treating Provider/Extender: [name] in Treatment: 41 T Contact Cast Applied for Wound Assessment: otal Wound #2 Right,Plantar Foot Performed By: Physician Duanne Guess, MD The following information was scribed by: Samuella Bruin The information was scribed for: Duanne Guess Post Procedure Diagnosis Same as Pre-procedure Electronic Signature(s) Signed: 06/15/2023 10:29:44 AM By: Duanne Guess MD FACS Signed: 06/15/2023 5:01:06 PM By: Gelene Mink  By: Samuella Bruin on 06/15/2023 09:47:55 -------------------------------------------------------------------------------- SuperBill Details Patient Name: Date of Service: [name], [name]. 06/15/2023 Medical Record Number: [id number] Patient Account Number: [id number] Date of Birth/Sex: Treating RN: October 28, 1955 (67 y.o. M) Primary Care Provider: Arva [name] Other Clinician: Referring Provider: Treating Provider/Extender: [name] in Treatment: 41 Diagnosis Coding ICD-10 Codes Code Description [phone] Non-pressure chronic ulcer of other part of right foot with fat layer exposed E11.621 Type 2 diabetes mellitus with foot ulcer I73.9 Peripheral vascular disease, unspecified R60.0 Localized edema I25.10 Atherosclerotic heart disease of native coronary artery without angina pectoris I10 Essential (primary) hypertension E11.65 Type 2 diabetes mellitus with hyperglycemia E11.40 Type 2 diabetes mellitus with diabetic neuropathy, unspecified I63.9 Cerebral infarction, unspecified Facility Procedures : CPT4 Code: [id number] Description: 11042 - DEB SUBQ TISSUE 20 SQ CM/< ICD-10 Diagnosis Description L97.512 Non-pressure chronic ulcer of other part of right foot with fat layer exposed Modifier: Quantity:  1 Physician Procedures : CPT4 Code Description Modifier [id number] - WC PHYS LEVEL 4 - EST PT 25 ICD-10 Diagnosis Description L97.512 Non-pressure chronic ulcer of other part of right foot with fat layer exposed E11.621 Type 2 diabetes mellitus with foot ulcer I73.9  Peripheral vascular disease, unspecified R60.0 Localized edema Quantity: 1 : [id number] 11042 - WC PHYS SUBQ TISS 20 SQ CM ICD-10 Diagnosis Description L97.512 Non-pressure chronic ulcer of other part of right foot with fat layer exposed [name], [name] ([id number]) [id number] Quantity: 1 df Page 13 of 13 Electronic Signature(s) Signed: 06/15/2023 10:19:50 AM By: Duanne Guess MD FACS Entered By: Duanne Guess on 06/15/2023 10:19:50

## 2023-06-18 DIAGNOSIS — M549 Dorsalgia, unspecified: Secondary | ICD-10-CM | POA: Diagnosis not present

## 2023-06-24 ENCOUNTER — Encounter (HOSPITAL_BASED_OUTPATIENT_CLINIC_OR_DEPARTMENT_OTHER): Payer: Medicare HMO | Attending: Internal Medicine | Admitting: Internal Medicine

## 2023-06-24 DIAGNOSIS — I110 Hypertensive heart disease with heart failure: Secondary | ICD-10-CM | POA: Diagnosis not present

## 2023-06-24 DIAGNOSIS — I872 Venous insufficiency (chronic) (peripheral): Secondary | ICD-10-CM | POA: Diagnosis not present

## 2023-06-24 DIAGNOSIS — E1151 Type 2 diabetes mellitus with diabetic peripheral angiopathy without gangrene: Secondary | ICD-10-CM | POA: Insufficient documentation

## 2023-06-24 DIAGNOSIS — Z7901 Long term (current) use of anticoagulants: Secondary | ICD-10-CM | POA: Insufficient documentation

## 2023-06-24 DIAGNOSIS — Z7902 Long term (current) use of antithrombotics/antiplatelets: Secondary | ICD-10-CM | POA: Insufficient documentation

## 2023-06-24 DIAGNOSIS — I2510 Atherosclerotic heart disease of native coronary artery without angina pectoris: Secondary | ICD-10-CM | POA: Diagnosis not present

## 2023-06-24 DIAGNOSIS — Z8673 Personal history of transient ischemic attack (TIA), and cerebral infarction without residual deficits: Secondary | ICD-10-CM | POA: Insufficient documentation

## 2023-06-24 DIAGNOSIS — G4733 Obstructive sleep apnea (adult) (pediatric): Secondary | ICD-10-CM | POA: Diagnosis not present

## 2023-06-24 DIAGNOSIS — L97512 Non-pressure chronic ulcer of other part of right foot with fat layer exposed: Secondary | ICD-10-CM | POA: Diagnosis not present

## 2023-06-24 DIAGNOSIS — I5032 Chronic diastolic (congestive) heart failure: Secondary | ICD-10-CM | POA: Diagnosis not present

## 2023-06-24 DIAGNOSIS — E11621 Type 2 diabetes mellitus with foot ulcer: Secondary | ICD-10-CM | POA: Insufficient documentation

## 2023-06-24 DIAGNOSIS — I4891 Unspecified atrial fibrillation: Secondary | ICD-10-CM | POA: Insufficient documentation

## 2023-06-24 DIAGNOSIS — E1140 Type 2 diabetes mellitus with diabetic neuropathy, unspecified: Secondary | ICD-10-CM | POA: Insufficient documentation

## 2023-06-24 DIAGNOSIS — L97518 Non-pressure chronic ulcer of other part of right foot with other specified severity: Secondary | ICD-10-CM | POA: Diagnosis not present

## 2023-06-24 DIAGNOSIS — E785 Hyperlipidemia, unspecified: Secondary | ICD-10-CM | POA: Insufficient documentation

## 2023-06-25 NOTE — Progress Notes (Signed)
Fredlund, Phelix A [phone] [id number] Page 1 of 7 Visit Report for 06/24/2023 Arrival Information Details Patient Name: Date of Service: [place], [name] 06/24/2023 10:00 A M Medical Record Number: [id number] Patient Account Number: [id number] Date of Birth/Sex: Treating RN: 14-Jan-1956 (68 y.o. M) Primary Care Keiaira Donlan: Arva Chafe Other Clinician: Referring Robertta Halfhill: Treating Altagracia Rone/Extender: Lynnell Catalan in Treatment: 42 Visit Information History Since Last Visit Added or deleted any medications: No Patient Arrived: Ambulatory Any new allergies or adverse reactions: No Arrival Time: 10:24 Had a fall or experienced change in No Accompanied By: friend activities of daily living that may affect Transfer Assistance: None risk of falls: Patient Identification Verified: Yes Signs or symptoms of abuse/neglect since last visito No Secondary Verification Process Completed: Yes Hospitalized since last visit: No Patient Requires Transmission-Based Precautions: No Implantable device outside of the clinic excluding No Patient Has Alerts: No cellular tissue based products placed in the center since last visit: Pain Present Now: No Electronic Signature(s) Signed: 06/24/2023 4:00:37 PM By: Dayton Scrape Entered By: Dayton Scrape on 06/24/2023 10:25:56 -------------------------------------------------------------------------------- Encounter Discharge Information Details Patient Name: Date of Service: [place], [name] 06/24/2023 10:00 A M Medical Record Number: [id number] Patient Account Number: [id number] Date of Birth/Sex: Treating RN: Apr 21, 1956 (67 y.o. [name] Primary Care Brenlynn Fake: Arva Chafe Other Clinician: Referring Kesean Serviss: Treating Loneta Tamplin/Extender: Lynnell Catalan in Treatment: 42 Encounter Discharge Information Items Discharge Condition: Stable Ambulatory Status:  Ambulatory Discharge Destination: Home Transportation: Private Auto Accompanied By: self Schedule Follow-up Appointment: Yes Clinical Summary of Care: Patient Declined Electronic Signature(s) Signed: 06/24/2023 4:50:46 PM By: Samuella Bruin Entered By: Samuella Bruin on 06/24/2023 11:03:16 Debes, Senon A ([id number]) [id number] Page 2 of 7 -------------------------------------------------------------------------------- Lower Extremity Assessment Details Patient Name: Date of Service: [place], [name] 06/24/2023 10:00 A M Medical Record Number: [id number] Patient Account Number: [id number] Date of Birth/Sex: Treating RN: 12-29-1955 (67 y.o. [name] Primary Care Skya Mccullum: Arva Chafe Other Clinician: Referring Righteous Claiborne: Treating Daaron Dimarco/Extender: Lynnell Catalan in Treatment: 42 Edema Assessment Assessed: [name]: No] [name]: No] Edema: [Left: Ye] [Right: s] Calf Left: Right: Point of Measurement: From Medial Instep 38.4 cm Ankle Left: Right: Point of Measurement: From Medial Instep 28.8 cm Vascular Assessment Pulses: Dorsalis Pedis Palpable: [Right:Yes] Extremity colors, hair growth, and conditions: Extremity Color: [Right:Normal] Hair Growth on Extremity: [Right:Yes] Temperature of Extremity: [Right:Warm] Capillary Refill: [Right:< 3 seconds] Dependent Rubor: [Right:No No] Electronic Signature(s) Signed: 06/24/2023 4:50:46 PM By: Samuella Bruin Entered By: Samuella Bruin on 06/24/2023 10:43:27 -------------------------------------------------------------------------------- Multi Wound Chart Details Patient Name: Date of Service: [name], [name] 06/24/2023 10:00 A M Medical Record Number: [id number] Patient Account Number: [id number] Date of Birth/Sex: Treating RN: 12-24-1955 (67 y.o. M) Primary Care Deania Siguenza: Arva Chafe Other Clinician: Referring Dougles Kimmey: Treating Lummie Montijo/Extender:  Lynnell Catalan in Treatment: 42 Vital Signs Height(in): 70 Pulse(bpm): 61 Weight(lbs): 260 Blood Pressure(mmHg): 181/79 Body Mass Index(BMI): 37.3 Temperature(F): 98.2 Respiratory Rate(breaths/min): 18 [2:Photos:] [N/A:N/A] Right, Plantar Foot N/A N/A Wound Location: Gradually Appeared N/A N/A Wounding Event: Diabetic Wound/Ulcer of the Lower N/A N/A Primary Etiology: Extremity Sleep Apnea, Arrhythmia, Coronary N/A N/A Comorbid History: Artery Disease, Deep Vein Thrombosis, Hypertension, Myocardial Infarction, Peripheral Arterial Disease, Type II Diabetes, Neuropathy 07/08/2022 N/A N/A Date Acquired: 39 N/A N/A Weeks of Treatment: Open N/A N/A Wound Status: No N/A N/A Wound Recurrence: 0.1x0.1x0.1 N/A N/A Measurements L x W x D (cm) 0.008 N/A N/A A (cm) : rea 0.001 N/A N/A  Volume (cm) : 99.70% N/A N/A % Reduction in A rea: 99.60% N/A N/A % Reduction in Volume: Grade 1 N/A N/A Classification: None Present N/A N/A Exudate A mount: Distinct, outline attached N/A N/A Wound Margin: None Present (0%) N/A N/A Granulation A mount: None Present (0%) N/A N/A Necrotic A mount: Fascia: No N/A N/A Exposed Structures: Fat Layer (Subcutaneous Tissue): No Tendon: No Muscle: No Joint: No Bone: No Large (67-100%) N/A N/A Epithelialization: Callus: Yes N/A N/A Periwound Skin Texture: Maceration: Yes N/A N/A Periwound Skin Moisture: Dry/Scaly: No No Abnormalities Noted N/A N/A Periwound Skin Color: No Abnormality N/A N/A Temperature: T Contact Cast otal N/A N/A Procedures Performed: Treatment Notes Wound #2 (Foot) Wound Laterality: Plantar, Right Cleanser Soap and Water Discharge Instruction: May shower and wash wound with dial antibacterial soap and water prior to dressing change. Wound Cleanser Discharge Instruction: Cleanse the wound with wound cleanser prior to applying a clean dressing using gauze sponges, not tissue or  cotton balls. Peri-Wound Care Topical Primary Dressing Secondary Dressing Woven Gauze Sponge, Non-Sterile 4x4 in Discharge Instruction: Apply over primary dressing as directed. Secured With 21M Medipore H Soft Cloth Surgical T ape, 4 x 10 (in/yd) Discharge Instruction: Secure with tape as directed. Compression Wrap Compression Stockings Add-Ons Electronic Signature(s) Signed: 06/24/2023 5:41:20 PM By: Baltazar Najjar MD Entered By: Baltazar Najjar on 06/24/2023 12:01:21 Coralie Carpen A ([id number]) [id number] Page 4 of 7 -------------------------------------------------------------------------------- Multi-Disciplinary Care Plan Details Patient Name: Date of Service: [place], [name] 06/24/2023 10:00 A M Medical Record Number: [id number] Patient Account Number: [id number] Date of Birth/Sex: Treating RN: Mar 05, 1956 (67 y.o. [name] Primary Care Hina Gupta: Arva Chafe Other Clinician: Referring Eletha Culbertson: Treating Masaye Gatchalian/Extender: Lynnell Catalan in Treatment: 42 Multidisciplinary Care Plan reviewed with physician Active Inactive Abuse / Safety / Falls / Self Care Management Nursing Diagnoses: Impaired physical mobility Potential for falls Goals: Patient will not experience any injury related to falls Date Initiated: 08/31/2022 Target Resolution Date: 07/16/2023 Goal Status: Active Patient/caregiver will verbalize/demonstrate measures taken to improve the patient's personal safety Date Initiated: 08/31/2022 Target Resolution Date: 07/16/2023 Goal Status: Active Interventions: Provide education on basic hygiene Provide education on fall prevention Notes: Wound/Skin Impairment Nursing Diagnoses: Impaired tissue integrity Knowledge deficit related to ulceration/compromised skin integrity Goals: Patient/caregiver will verbalize understanding of skin care regimen Date Initiated: 08/31/2022 Target Resolution  Date: 07/16/2023 Goal Status: Active Interventions: Assess patient/caregiver ability to obtain necessary supplies Assess patient/caregiver ability to perform ulcer/skin care regimen upon admission and as needed Assess ulceration(s) every visit Treatment Activities: Skin care regimen initiated : 08/31/2022 Topical wound management initiated : 08/31/2022 Notes: Electronic Signature(s) Signed: 06/24/2023 4:50:46 PM By: Samuella Bruin Entered By: Samuella Bruin on 06/24/2023 10:46:04 Rings, Sonnie A ([id number]) [id number] Page 5 of 7 -------------------------------------------------------------------------------- Pain Assessment Details Patient Name: Date of Service: [place], [name] 06/24/2023 10:00 A M Medical Record Number: [id number] Patient Account Number: [id number] Date of Birth/Sex: Treating RN: Dec 28, 1955 (66 y.o. M) Primary Care Chase Arnall: Arva Chafe Other Clinician: Referring Brionne Mertz: Treating Jericca Russett/Extender: Lynnell Catalan in Treatment: 42 Active Problems Location of Pain Severity and Description of Pain Patient Has Paino No Site Locations Pain Management and Medication Current Pain Management: Electronic Signature(s) Signed: 06/24/2023 4:00:37 PM By: Dayton Scrape Entered By: Dayton Scrape on 06/24/2023 10:27:00 -------------------------------------------------------------------------------- Patient/Caregiver Education Details Patient Name: Date of Service: [place], [name] 12/5/2024andnbsp10:00 A M Medical Record Number: [id number] Patient Account Number: [id number] Date of Birth/Gender: Treating RN: [birth year] (68 y.o. M)  Samuella Bruin Primary Care Physician: Arva Chafe Other Clinician: Referring Physician: Treating Physician/Extender: Lynnell Catalan in Treatment: 17 Education Assessment Education Provided To: Patient Education Topics Provided Wound/Skin  Impairment: Methods: Explain/Verbal Responses: Reinforcements needed, State content correctly Electronic Signature(s) Signed: 06/24/2023 4:50:46 PM By: Samuella Bruin Entered By: Samuella Bruin on 06/24/2023 10:46:14 Riera, Rio A ([id number]) [id number] Page 6 of 7 -------------------------------------------------------------------------------- Wound Assessment Details Patient Name: Date of Service: [place], [name] 06/24/2023 10:00 A M Medical Record Number: [id number] Patient Account Number: [id number] Date of Birth/Sex: Treating RN: 17-Dec-1955 (67 y.o. M) Primary Care Karsyn Rochin: Arva Chafe Other Clinician: Referring Angeles Paolucci: Treating Maalle Starrett/Extender: Lynnell Catalan in Treatment: 42 Wound Status Wound Number: 2 Primary Diabetic Wound/Ulcer of the Lower Extremity Etiology: Wound Location: Right, Plantar Foot Wound Open Wounding Event: Gradually Appeared Status: Date Acquired: 07/08/2022 Comorbid Sleep Apnea, Arrhythmia, Coronary Artery Disease, Deep Vein Weeks Of Treatment: 42 History: Thrombosis, Hypertension, Myocardial Infarction, Peripheral Arterial Clustered Wound: No Disease, Type II Diabetes, Neuropathy Photos Wound Measurements Length: (cm) 0 Width: (cm) 0 Depth: (cm) 0 Area: (cm) Volume: (cm) .1 % Reduction in Area: 99.7% .1 % Reduction in Volume: 99.6% .1 Epithelialization: Large (67-100%) 0.008 Tunneling: No 0.001 Undermining: No Wound Description Classification: Grade 1 Wound Margin: Distinct, outline attached Exudate Amount: None Present Foul Odor After Cleansing: No Slough/Fibrino No Wound Bed Granulation Amount: None Present (0%) Exposed Structure Necrotic Amount: None Present (0%) Fascia Exposed: No Fat Layer (Subcutaneous Tissue) Exposed: No Tendon Exposed: No Muscle Exposed: No Joint Exposed: No Bone Exposed: No Periwound Skin Texture Texture Color No Abnormalities Noted:  No No Abnormalities Noted: Yes Callus: Yes Temperature / Pain Temperature: No Abnormality Moisture No Abnormalities Noted: Yes Treatment Notes Wound #2 (Foot) Wound Laterality: Plantar, Right Cleanser Soap and Water Stan, [place] A ([id number]) [id number] Page 7 of 7 Discharge Instruction: May shower and wash wound with dial antibacterial soap and water prior to dressing change. Wound Cleanser Discharge Instruction: Cleanse the wound with wound cleanser prior to applying a clean dressing using gauze sponges, not tissue or cotton balls. Peri-Wound Care Topical Primary Dressing Secondary Dressing Woven Gauze Sponge, Non-Sterile 4x4 in Discharge Instruction: Apply over primary dressing as directed. Secured With 33M Medipore H Soft Cloth Surgical T ape, 4 x 10 (in/yd) Discharge Instruction: Secure with tape as directed. Compression Wrap Compression Stockings Add-Ons Electronic Signature(s) Signed: 06/24/2023 4:50:46 PM By: Samuella Bruin Entered By: Samuella Bruin on 06/24/2023 10:46:43 -------------------------------------------------------------------------------- Vitals Details Patient Name: Date of Service: [name], [name] 06/24/2023 10:00 A M Medical Record Number: [id number] Patient Account Number: [id number] Date of Birth/Sex: Treating RN: [birth year] (67 y.o. M) Primary Care Evy Lutterman: Arva Chafe Other Clinician: Referring Loletha Bertini: Treating Oaklyn Mans/Extender: Lynnell Catalan in Treatment: 42 Vital Signs Time Taken: 10:26 Temperature (F): 98.2 Height (in): 70 Pulse (bpm): 61 Weight (lbs): 260 Respiratory Rate (breaths/min): 18 Body Mass Index (BMI): 37.3 Blood Pressure (mmHg): 181/79 Reference Range: 80 - 120 mg / dl Electronic Signature(s) Signed: 06/24/2023 4:00:37 PM By: Dayton Scrape Entered By: Dayton Scrape on 06/24/2023 10:26:39

## 2023-06-25 NOTE — Progress Notes (Signed)
[name], [name] [name] ([id number]) [id number] Page 1 of 9 Visit Report for 06/24/2023 HPI Details Patient Name: Date of Service: [name] [name], [name] [name]. 06/24/2023 10:00 [name] Medical Record Number: [id number] Patient Account Number: [id number] Date of Birth/Sex: Treating RN: [birth year] (67 y.o. M) Primary Care Provider: Arva Chafe Other Clinician: Referring Provider: Treating Provider/Extender: Lynnell Catalan in Treatment: 42 History of Present Illness HPI Description: ADMISSION 05/14/2020; this is [name] with multiple medical issues. Predominantly he has type 2 diabetes with [name] and also history of fairly significant PAD. He had [name] in February 2017 he also had an atherectomy and angioplasty by Dr. [name] of the right popliteal artery in 2016. He is supposed to be getting arterial studies annually however this was interrupted last year because of the pandemic. He tells [place] he was at [facility] 2 weeks ago was getting out of of the scooter and traumatized his left lateral lower leg. There was [name] as the patient is on Plavix and Eliquis. They have been dressing this with Neosporin and doing [name] fairly good job. Wound measures 2.5 x 3.5 it does not have any depth he does not have [name] in his legs outside of surgery however he does have chronic edema and skin changes suggestive of chronic venous disease possibly some degree of lymphedema as well. Past medical history includes type 2 diabetes with peripheral neuropathy and gait instability, lumbar spondylosis, obstructive sleep apnea, history of [name], basal cell skin cancer, atrial fibrillation on anticoagulation, significant PAD as noted with [name] and [name] in  30th 2016. He also has [name] of coronary artery disease with an MI in 2002 hypertension hyperlipidemia and heart failure with preserved ejection fraction His last arterial studies I can see in epic were on 03/10/2018 this showed [name] and [name] with monophasic waveforms on the right. On the left his ABI was 1.20 with [name] and triphasic waveforms. He has not had arterial studies since. Our nurse in the clinic got an ABI on the left of 1.1 11/2; left anterior leg wound in the setting of chronic venous insufficiency. Wound was initially trauma. We have been using Hydrofera Blue under compression he has home health.. The wound looks [name] lot better today with improvement in surface area 11/16; left anterior leg wound in the setting of chronic venous insufficiency. Wound was initially trauma. We have been using Hydrofera Blue under compression. The patient is closed today. He is supposed to follow-up with vein and vascular with regards to arterial insufficiency nevertheless his leg wounds are 12/3; apparently 2 weeks ago when they were putting on their stockings they managed to get 3 wounds on the left anterior lower leg from abrasion when putting on the stockings. Home health came by the week of Thanksgiving and put Hydrofera Blue 4-layer wrap on this and there is only one superficial area remaining. The patient and his wife complained about the difficulties getting stockings on I think we are using 20/30. We will order bilateral external compression stockings which should be easier. 12/10; wound on the left anterior lower leg is closed. He has chronic venous insufficiency we ordered him Farrow wrap stockings unfortunately he did not bring these in. 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 He says that it has been present since mid December. He is currently residing in [facility] until March and [name]  podiatrist has been looking after him there. They have been simply painting the area with Betadine. He has not had any lower extremity arterial studies since 2019, at which time his right ABI was 0.69. Measured in clinic today, it was 0.71. He is not aware of his most recent hemoglobin A1c, but historically he has had exceptionally poor control. On the basis of his right first metatarsal, there is [name] small crescent shaped wound. There is surrounding eschar and callus. There is no malodor or purulent drainage. 09/08/2022: The original wound is smaller today and fairly clean, but there is some discoloration and [name] pulpy texture to the adjacent callus. Underneath this, the tissue is open exposing the fat layer. It looks like perhaps there was [name] crack in the callus and moisture got under the skin and caused breakdown. 09/15/2022: There has been more moisture related tissue breakdown. The callus is very soft and the underlying tissue is more open. 09/28/2022: The wound looks much better. He has done [name] good job keeping it dry. There is some callus overlying much of the wound surface. There is slough on the exposed open areas. 10/14/2022: There has been [name] lot of moisture related tissue breakdown. He is still forming callus over the top and then it seems that moisture gets underneath the callus and causes tissue damage. There is slough on the exposed wound surface. They will be moving back to the local area from the beach this weekend. 10/21/2022: His foot is less wet, but there is no significant change to his wound. He has developed [name] blister on his left anterior tibial surface. There is no open wound here, but he does have some fairly significant edema. We are planning to apply [name] total contact cast today. 10/23/2022: Here for his obligatory first cast change. He says he has not had any issues wearing the cast or walking in the boot. No detrimental effects on his wound. 10/29/2022: The wound is looking better.  Clearly, putting him in the cast has prevented water from getting in under his callus and causing further tissue breakdown. The blister on his left anterior tibial surface has not yet ruptured. Edema control is significantly improved. 11/05/2022: The wound on his right plantar foot is getting better. There is still some callus accumulation around the wounds, but there are just 2 open areas now, [name] very small 1 on the medial aspect of his metatarsal head and [name] little bit larger 1 on the plantar surface. The blister on his left anterior tibial surface is now open with hanging dry skin. [name] [name], [name] [name] ([id number]) [id number] Page 2 of 9 11/12/2022: The left anterior tibial wound is closed. The wound on his right plantar foot is smaller with just [name] little bit of callus around the edges. 11/19/2022: The right plantar foot wound continues to contract. The surface is quite clean. The tiny satellite area on the medial aspect of his foot has closed. 11/26/2022: The wound is smaller again today. He is responding well to the offloading effects of total contact casting. 12/03/2022: The wound is about the same size today. There is [name] little bit of moisture around the perimeter of the wound. No significant tissue breakdown. 12/10/2022: The wound is smaller today. There has been no moisture-related tissue breakdown of any  maceration. 12/21/2022: The wound continues to contract. Moisture control is excellent. 12/28/2022: The wound is [name] little bit smaller today. 01/05/2023: The wound measures smaller today. Excellent moisture control with zero evidence of maceration. The wound surface is clean with healthy granulation tissue. 01/13/2023: We are leaving his Apligraf in situ for [name] second week. There is no discolored drainage or malodor coming from the site. 01/19/2023: The wound is smaller today. The periwound skin is in good condition without tissue maceration and the healed areas of tissue appear to  have improved integrity. 01/27/2023: Continued contraction of the wound. No tissue maceration. The wound surface is flush with the surrounding skin. 02/02/2023: The wound measured [name] little bit smaller today. There is some bruising just adjacent to the wound, but this has not resulted in any tissue breakdown. 02/09/2023: The wound is smaller again today. The area of bruising that was seen last week has resolved and there has been no tissue damage as [name] result. There is some callus accumulation around the wound edges along with minimal soft slough on the surface. 02/16/2023: The wound measured [name] little bit larger today. There is [name] little bit of maceration around the wound edges, which is likely responsible. 02/23/2023: The wound is smaller today and there is epithelium encroaching around the edges. There is no periwound tissue maceration, nor has he accumulated any significant callus. 03/03/2023: His wound continues to contract. There is no maceration nor any significant callus. There is slight slough on the wound surface. 03/09/2023: The wound is [name] little bit smaller again today. He is managing very well in the peg assist offloading insert and there has been no tissue breakdown or periwound maceration as [name] result of being out of the total contact cast. 03/16/2023: The wound measured smaller again today. He continues to do well using the peg assist offloading insert. 03/23/2023: Although the wound measured the same size today, it visually appears smaller to me. There is good granulation tissue with minimal slough. There is [name] little callus around the edges. 03/30/2023: The wound is smaller today. The granulation tissue is healthy. There is [name] little bit of callus accumulation around the edges. 04/06/2023: Unfortunately, he did not have any assistance to change his dressing during the week and he was also walking substantially more due to attending [name] family event. This has resulted in fairly significant breakdown of  his wound. Much of this seems to be related to moisture accumulation. 04/13/2023: The wound looks markedly better this week after being in the total contact cast. There is no moisture-related tissue breakdown, and the wound opening is smaller. 04/20/2023: The wound is smaller again today. The periwound skin is nice and dry and there is no concern for infection. 04/27/2023: The wound continues to contract. The surface is clean. There is [name] little bit of periwound moisture, but the wound itself has not broken down at all. 05/04/2023: The wound is stable this week. He has had an increase in his left lower leg swelling, however, and he has an intact blister on the distal medial aspect of the leg, adjacent to where his saphenous vein was harvested. 05/11/2023: The wound is [name] little bit narrower from 12-6 o'clock but slightly wider from 9-3 o'clock. It is otherwise fairly clean. The blister on his left lower leg is still present but flat. Edema is improved. 05/18/2023: The blister on his left lower leg has resolved. The wound on his right first metatarsal head is smaller today. There is [name] little bit of  slough, callus, and eschar present. Moisture balance is good. 05/25/2023: His first metatarsal head wound is down to just [name] narrow slit. No concern for infection. 06/01/2023: The wound has contracted further. There is [name] little bit of periwound dry skin and callus. 06/08/2023: The wound is down to just [name] couple of millimeters. There is minimal dry skin and callus around the edges. No tissue maceration. 06/15/2023: The wound surface is covered over with dry skin and callus. Unfortunately, as I began to debride this, purulent drainage emerged. There is no surrounding erythema or induration, nor is there any malodor. 12/5; the area on the first metatarsal head on the right is epithelialized. However it is fragile and would certainly benefit from another week of [name] total contact cast. I think there is also some  subluxation of the first metatarsal head which may make maintenance of healing difficult. Electronic Signature(s) Signed: 06/24/2023 5:41:20 PM By: Baltazar Najjar MD Entered By: Baltazar Najjar on 06/24/2023 12:03:11 [name] [name], [name] [name] ([id number]) [id number] Page 3 of 9 -------------------------------------------------------------------------------- Physical Exam Details Patient Name: Date of Service: [name] [name], [name] [name]. 06/24/2023 10:00 [name] Medical Record Number: [id number] Patient Account Number: [id number] Date of Birth/Sex: Treating RN: [birth year] (66 y.o. M) Primary Care Provider: Arva Chafe Other Clinician: Referring Provider: Treating Provider/Extender: Lynnell Catalan in Treatment: 42 Constitutional Patient is hypertensive.. Pulse regular and within target range for patient.[name] Respirations regular, non-labored and within target range.. Temperature is normal and within the target range for the patient.[name] Appears in no distress. Notes Wound exam; everything is epithelialized over the right first metatarsal head note the previously mentioned subluxation of the first metatarsal head in this area. There is no evidence of infection Electronic Signature(s) Signed: 06/24/2023 5:41:20 PM By: Baltazar Najjar MD Entered By: Baltazar Najjar on 06/24/2023 12:04:12 -------------------------------------------------------------------------------- Physician Orders Details Patient Name: Date of Service: [name] Pian, [name] [name]. 06/24/2023 10:00 [name] Medical Record Number: [id number] Patient Account Number: [id number] Date of Birth/Sex: Treating RN: 06/03/1956 (67 y.o. [name] Primary Care Provider: Arva Chafe Other Clinician: Referring Provider: Treating Provider/Extender: Lynnell Catalan in Treatment: 42 The following information was scribed by: Samuella Bruin The information was scribed for: Baltazar Najjar Verbal / Phone Orders: No Diagnosis Coding Follow-up Appointments ppointment in 1 week. - Dr. Lady Gary - room 2 TCC Return [name] Other: - purchase Dr. Margart Sickles insoles and callus pads to wear next visit Bathing/ Shower/ Hygiene May shower with protection but do not get wound dressing(s) wet. Protect dressing(s) with water repellant cover (for example, large plastic bag) or [name] cast cover and may then take shower. Off-Loading Total Contact Cast to Right Lower Extremity Removable cast walker boot to: - right leg Non Wound Condition Left Lower Extremity Other Non Wound Condition Orders/Instructions: - compression wrap urgo lite to left leg Wound Treatment Wound #2 - Foot Wound Laterality: Plantar, Right Cleanser: Soap and Water 1 x Per Week/30 Days Discharge Instructions: May shower and wash wound with dial antibacterial soap and water prior to dressing change. Cleanser: Wound Cleanser 1 x Per Week/30 Days Discharge Instructions: Cleanse the wound with wound cleanser prior to applying [name] clean dressing using gauze sponges, not tissue or cotton balls. Secondary Dressing: Woven Gauze Sponge, Non-Sterile 4x4 in 1 x Per Week/30 Days Discharge Instructions: Apply over primary dressing as directed. KIMARI, VONG [name] ([id number]) [id number] Page 4 of 9 Secured With: 96M Medipore H Soft Cloth Surgical T ape, 4 x 10 (in/yd) 1 x Per Week/30  Days Discharge Instructions: Secure with tape as directed. Electronic Signature(s) Signed: 06/24/2023 4:50:46 PM By: Samuella Bruin Signed: 06/24/2023 5:41:20 PM By: Baltazar Najjar MD Entered By: Samuella Bruin on 06/24/2023 11:02:42 -------------------------------------------------------------------------------- Problem List Details Patient Name: Date of Service: [name] Pian, [name] [name]. 06/24/2023 10:00 [name] Medical Record Number: [id number] Patient Account Number: [id number] Date of Birth/Sex: Treating RN: 1955-09-22 (67 y.o. M) Primary  Care Provider: Arva Chafe Other Clinician: Referring Provider: Treating Provider/Extender: Lynnell Catalan in Treatment: 42 Active Problems ICD-10 Encounter Code Description Active Date MDM Diagnosis L97.512 Non-pressure chronic ulcer of other part of right foot with fat layer exposed 08/31/2022 No Yes E11.621 Type 2 diabetes mellitus with foot ulcer 08/31/2022 No Yes I73.9 Peripheral vascular disease, unspecified 08/31/2022 No Yes R60.0 Localized edema 05/04/2023 No Yes I25.10 Atherosclerotic heart disease of native coronary artery without angina pectoris 08/31/2022 No Yes I10 Essential (primary) hypertension 08/31/2022 No Yes E11.65 Type 2 diabetes mellitus with hyperglycemia 08/31/2022 No Yes E11.40 Type 2 diabetes mellitus with diabetic neuropathy, unspecified 08/31/2022 No Yes I63.9 Cerebral infarction, unspecified 08/31/2022 No Yes Inactive Problems Resolved Problems ICD-10 Glover, Leamon [name] ([id number]) [id number] Page 5 of 9 Code Description Active Date Resolved Date L97.821 Non-pressure chronic ulcer of other part of left lower leg limited to breakdown of skin 11/05/2022 11/05/2022 Electronic Signature(s) Signed: 06/24/2023 5:41:20 PM By: Baltazar Najjar MD Entered By: Baltazar Najjar on 06/24/2023 12:01:14 -------------------------------------------------------------------------------- Progress Note Details Patient Name: Date of Service: [name] Pian, [name] [name]. 06/24/2023 10:00 [name] Medical Record Number: [id number] Patient Account Number: [id number] Date of Birth/Sex: Treating RN: 1955/09/11 (67 y.o. M) Primary Care Provider: Arva Chafe Other Clinician: Referring Provider: Treating Provider/Extender: Lynnell Catalan in Treatment: 42 Subjective History of Present Illness (HPI) ADMISSION 05/14/2020; this is [name] with multiple medical issues. Predominantly he has type 2 diabetes with [name]  history of peripheral neuropathy and also history of fairly significant PAD. He had [name] in February 2017 he also had an atherectomy and angioplasty by Dr. [name] of the right popliteal artery in 2016. He is supposed to be getting arterial studies annually however this was interrupted last year because of the pandemic. He tells [place] he was at [facility] 2 weeks ago was getting out of of the scooter and traumatized his left lateral lower leg. There was [name] as the patient is on Plavix and Eliquis. They have been dressing this with Neosporin and doing [name] fairly good job. Wound measures 2.5 x 3.5 it does not have any depth he does not have [name] in his legs outside of surgery however he does have chronic edema and skin changes suggestive of chronic venous disease possibly some degree of lymphedema as well. Past medical history includes type 2 diabetes with peripheral neuropathy and gait instability, lumbar spondylosis, obstructive sleep apnea, history of [name], basal cell skin cancer, atrial fibrillation on anticoagulation, significant PAD as noted with [name] and [name] in 30th 2016. He also has [name] of coronary artery disease with an MI in 2002 hypertension hyperlipidemia and heart failure with preserved ejection fraction His last arterial studies I can see in epic were on 03/10/2018 this showed [name] and [name] with monophasic waveforms on the right. On the left his ABI was  1.20 with [name] and triphasic waveforms. He has not had arterial studies since. Our nurse in the clinic got an ABI on the left of 1.1 11/2; left anterior leg wound in the setting of chronic venous insufficiency. Wound was initially trauma. We have been using Hydrofera Blue under compression he has  home health.. The wound looks [name] lot better today with improvement in surface area 11/16; left anterior leg wound in the setting of chronic venous insufficiency. Wound was initially trauma. We have been using Hydrofera Blue under compression. The patient is closed today. He is supposed to follow-up with vein and vascular with regards to arterial insufficiency nevertheless his leg wounds are 12/3; apparently 2 weeks ago when they were putting on their stockings they managed to get 3 wounds on the left anterior lower leg from abrasion when putting on the stockings. Home health came by the week of Thanksgiving and put Hydrofera Blue 4-layer wrap on this and there is only one superficial area remaining. The patient and his wife complained about the difficulties getting stockings on I think we are using 20/30. We will order bilateral external compression stockings which should be easier. 12/10; wound on the left anterior lower leg is closed. He has chronic venous insufficiency we ordered him Farrow wrap stockings unfortunately he did not bring these in. 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 He says that it has been present since mid December. He is currently residing in [facility] until March and [name] podiatrist has been looking after him there. They have been simply painting the area with Betadine. He has not had any lower extremity arterial studies since 2019, at which time his right ABI was 0.69. Measured in clinic today, it was 0.71. He is not aware of his most recent hemoglobin A1c, but historically he has had exceptionally poor control. On the basis of his right first metatarsal, there is [name] small crescent shaped wound. There is surrounding eschar and callus. There is no malodor or purulent drainage. 09/08/2022: The original wound is smaller today and fairly clean, but there is some discoloration and [name] pulpy texture to the  adjacent callus. Underneath this, the tissue is open exposing the fat layer. It looks like perhaps there was [name] crack in the callus and moisture got under the skin and caused breakdown. 09/15/2022: There has been more moisture related tissue breakdown. The callus is very soft and the underlying tissue is more open. 09/28/2022: The wound looks much better. He has done [name] good job keeping it dry. There is some callus overlying much of the wound surface. There is slough on the exposed open areas. 10/14/2022: There has been [name] lot of moisture related tissue breakdown. He is still forming callus over the top and then it seems that moisture gets underneath the callus and causes tissue damage. There is slough on the exposed wound surface. They will be moving back to the [place] the [place] this weekend. 10/21/2022: His foot is less wet, but there is no significant change to his wound. He has developed [name] blister on his left anterior tibial surface. There is no open wound here, but he does have some fairly significant edema. We are planning to apply [name] total contact cast today. [name] [name], [name] [name] ([id number]) [id number] Page 6 of 9 10/23/2022: Here for his obligatory first cast change. He says he has not had any issues wearing the cast or  walking in the boot. No detrimental effects on his wound. 10/29/2022: The wound is looking better. Clearly, putting him in the cast has prevented water from getting in under his callus and causing further tissue breakdown. The blister on his left anterior tibial surface has not yet ruptured. Edema control is significantly improved. 11/05/2022: The wound on his right plantar foot is getting better. There is still some callus accumulation around the wounds, but there are just 2 open areas now, [name] very small 1 on the medial aspect of his metatarsal head and [name] little bit larger 1 on the plantar surface. The blister on his left anterior tibial surface is now open  with hanging dry skin. 11/12/2022: The left anterior tibial wound is closed. The wound on his right plantar foot is smaller with just [name] little bit of callus around the edges. 11/19/2022: The right plantar foot wound continues to contract. The surface is quite clean. The tiny satellite area on the medial aspect of his foot has closed. 11/26/2022: The wound is smaller again today. He is responding well to the offloading effects of total contact casting. 12/03/2022: The wound is about the same size today. There is [name] little bit of moisture around the perimeter of the wound. No significant tissue breakdown. 12/10/2022: The wound is smaller today. There has been no moisture-related tissue breakdown of any maceration. 12/21/2022: The wound continues to contract. Moisture control is excellent. 12/28/2022: The wound is [name] little bit smaller today. 01/05/2023: The wound measures smaller today. Excellent moisture control with zero evidence of maceration. The wound surface is clean with healthy granulation tissue. 01/13/2023: We are leaving his Apligraf in situ for [name] second week. There is no discolored drainage or malodor coming from the site. 01/19/2023: The wound is smaller today. The periwound skin is in good condition without tissue maceration and the healed areas of tissue appear to have improved integrity. 01/27/2023: Continued contraction of the wound. No tissue maceration. The wound surface is flush with the surrounding skin. 02/02/2023: The wound measured [name] little bit smaller today. There is some bruising just adjacent to the wound, but this has not resulted in any tissue breakdown. 02/09/2023: The wound is smaller again today. The area of bruising that was seen last week has resolved and there has been no tissue damage as [name] result. There is some callus accumulation around the wound edges along with minimal soft slough on the surface. 02/16/2023: The wound measured [name] little bit larger today. There is [name] little bit of  maceration around the wound edges, which is likely responsible. 02/23/2023: The wound is smaller today and there is epithelium encroaching around the edges. There is no periwound tissue maceration, nor has he accumulated any significant callus. 03/03/2023: His wound continues to contract. There is no maceration nor any significant callus. There is slight slough on the wound surface. 03/09/2023: The wound is [name] little bit smaller again today. He is managing very well in the peg assist offloading insert and there has been no tissue breakdown or periwound maceration as [name] result of being out of the total contact cast. 03/16/2023: The wound measured smaller again today. He continues to do well using the peg assist offloading insert. 03/23/2023: Although the wound measured the same size today, it visually appears smaller to me. There is good granulation tissue with minimal slough. There is [name] little callus around the edges. 03/30/2023: The wound is smaller today. The granulation tissue is healthy. There is [name] little bit of callus accumulation  around the edges. 04/06/2023: Unfortunately, he did not have any assistance to change his dressing during the week and he was also walking substantially more due to attending [name] family event. This has resulted in fairly significant breakdown of his wound. Much of this seems to be related to moisture accumulation. 04/13/2023: The wound looks markedly better this week after being in the total contact cast. There is no moisture-related tissue breakdown, and the wound opening is smaller. 04/20/2023: The wound is smaller again today. The periwound skin is nice and dry and there is no concern for infection. 04/27/2023: The wound continues to contract. The surface is clean. There is [name] little bit of periwound moisture, but the wound itself has not broken down at all. 05/04/2023: The wound is stable this week. He has had an increase in his left lower leg swelling, however, and he has an  intact blister on the distal medial aspect of the leg, adjacent to where his saphenous vein was harvested. 05/11/2023: The wound is [name] little bit narrower from 12-6 o'clock but slightly wider from 9-3 o'clock. It is otherwise fairly clean. The blister on his left lower leg is still present but flat. Edema is improved. 05/18/2023: The blister on his left lower leg has resolved. The wound on his right first metatarsal head is smaller today. There is [name] little bit of slough, callus, and eschar present. Moisture balance is good. 05/25/2023: His first metatarsal head wound is down to just [name] narrow slit. No concern for infection. 06/01/2023: The wound has contracted further. There is [name] little bit of periwound dry skin and callus. 06/08/2023: The wound is down to just [name] couple of millimeters. There is minimal dry skin and callus around the edges. No tissue maceration. 06/15/2023: The wound surface is covered over with dry skin and callus. Unfortunately, as I began to debride this, purulent drainage emerged. There is no surrounding erythema or induration, nor is there any malodor. 12/5; the area on the first metatarsal head on the right is epithelialized. However it is fragile and would certainly benefit from another week of [name] total contact cast. I think there is also some subluxation of the first metatarsal head which may make maintenance of healing difficult. [name] [name], [name] [name] ([id number]) [id number] Page 7 of 9 Objective Constitutional Patient is hypertensive.. Pulse regular and within target range for patient.[name] Respirations regular, non-labored and within target range.. Temperature is normal and within the target range for the patient.[name] Appears in no distress. Vitals Time Taken: 10:26 AM, Height: 70 in, Weight: 260 lbs, BMI: 37.3, Temperature: 98.2 F, Pulse: 61 bpm, Respiratory Rate: 18 breaths/min, Blood Pressure: 181/79 mmHg. General Notes: Wound exam; everything is  epithelialized over the right first metatarsal head note the previously mentioned subluxation of the first metatarsal head in this area. There is no evidence of infection Integumentary (Hair, Skin) Wound #2 status is Open. Original cause of wound was Gradually Appeared. The date acquired was: 07/08/2022. The wound has been in treatment 42 weeks. The wound is located on the Right,Plantar Foot. The wound measures 0.1cm length x 0.1cm width x 0.1cm depth; 0.008cm^2 area and 0.001cm^3 volume. There is no tunneling or undermining noted. There is [name] none present amount of drainage noted. The wound margin is distinct with the outline attached to the wound base. There is no granulation within the wound bed. There is no necrotic tissue within the wound bed. The periwound skin appearance had no abnormalities noted for moisture. The periwound skin appearance had no  abnormalities noted for color. The periwound skin appearance exhibited: Callus. Periwound temperature was noted as No Abnormality. 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 There was [name] T Contact Cast otal Procedure by Maxwell Caul., MD. Post procedure Diagnosis Wound #2: Same as Pre-Procedure Plan Follow-up Appointments: Return Appointment in 1 week. - Dr. Lady Gary - room 2 TCC Other: - purchase Dr. Margart Sickles insoles and callus pads to wear next visit Bathing/ Shower/ Hygiene: May  shower with protection but do not get wound dressing(s) wet. Protect dressing(s) with water repellant cover (for example, large plastic bag) or [name] cast cover and may then take shower. Off-Loading: T Contact Cast to Right Lower Extremity otal Removable cast walker boot to: - right leg Non Wound Condition: Other Non Wound Condition Orders/Instructions: - compression wrap urgo lite to left leg WOUND #2: - Foot Wound Laterality: Plantar, Right Cleanser: Soap and Water 1 x Per Week/30 Days Discharge Instructions: May shower and wash wound with dial antibacterial soap and water prior to dressing change. Cleanser: Wound Cleanser 1 x Per Week/30 Days Discharge Instructions: Cleanse the wound with wound cleanser prior to applying [name] clean dressing using gauze sponges, not tissue or cotton balls. Secondary Dressing: Woven Gauze Sponge, Non-Sterile 4x4 in 1 x Per Week/30 Days Discharge Instructions: Apply over primary dressing as directed. Secured With: 42M Medipore H Soft Cloth Surgical T ape, 4 x 10 (in/yd) 1 x Per Week/30 Days Discharge Instructions: Secure with tape as directed. 1. We reapplied the total contact cast for 1 more week Liera, Taivon [name] ([id number]) [phone].pdf Page 8 of 9 2. T the patient he will need callus pads insoles for his shoes old 3. He does not have diabetic foot wear but that certainly would be an option to explore. Electronic Signature(s) Signed: 06/24/2023 5:41:20 PM By: Baltazar Najjar MD Entered By: Baltazar Najjar on 06/24/2023 12:05:07 -------------------------------------------------------------------------------- Total Contact Cast Details Patient Name: Date of Service: [name] Sabal RK [name]. 06/24/2023 10:00 [name] Medical Record Number: [id number] Patient Account Number: [id number] Date of Birth/Sex: Treating RN: April 23, 1956 (67 y.o. M) Primary Care Provider: Arva Chafe Other Clinician: Referring Provider: Treating Provider/Extender: Lynnell Catalan in Treatment: 42 T Contact Cast Applied for Wound Assessment: otal Wound #2 Right,Plantar Foot Performed By: Physician Maxwell Caul., MD The following information was scribed by: Samuella Bruin The information was scribed for: Gregery Na Procedure Diagnosis Same as Pre-procedure Electronic Signature(s) Signed: 06/24/2023 5:41:20 PM By: Baltazar Najjar MD Entered By: Baltazar Najjar on 06/24/2023 12:02:25 -------------------------------------------------------------------------------- SuperBill Details Patient Name: Date of Service: [name] Sabal RK [name]. 06/24/2023 Medical Record Number: [id number] Patient Account Number: [id number] Date of Birth/Sex: Treating RN: February 23, 1956 (67 y.o. M) Primary Care Provider: Arva Chafe Other Clinician: Referring Provider: Treating Provider/Extender: Lynnell Catalan in Treatment: 42 Diagnosis Coding ICD-10 Codes Code Description [phone] Non-pressure chronic ulcer of other part of right foot with fat layer exposed E11.621 Type 2 diabetes mellitus with foot ulcer I73.9 Peripheral vascular disease, unspecified R60.0 Localized edema I25.10 Atherosclerotic heart disease of native coronary artery  without angina pectoris I10 Essential (primary) hypertension E11.65 Type 2 diabetes mellitus with hyperglycemia E11.40 Type 2 diabetes mellitus with diabetic neuropathy, unspecified I63.9 Cerebral infarction, unspecified Facility Procedures : [name] [name], [name] [name] Code: [id number] K [name] ([id number]) Description: [phone] - APPLY TOTAL CONTACT LEG CAST [id number] ICD-10 Diagnosis Description L97.512 Non-pressure chronic ulcer of other part of right foot with fat layer expos E11.621 Type 2 diabetes mellitus with foot ulcer Modifier: [id number] ed Quantity: 1 [id number] Page 9 of 9 Physician Procedures : CPT4 Code Description Modifier [id number] - WC PHYS LEVEL 3 - EST PT ICD-10  Diagnosis Description L97.512 Non-pressure chronic ulcer of other part of right foot with fat layer exposed E11.621 Type 2 diabetes mellitus with foot ulcer Quantity: 1 : [id number] - WC PHYS APPLY TOTAL CONTACT CAST ICD-10 Diagnosis Description L97.512 Non-pressure chronic ulcer of other part of right foot with fat layer exposed E11.621 Type 2 diabetes mellitus with foot ulcer Quantity: 1 Electronic Signature(s) Signed: 06/28/2023 2:59:23 PM By: Pearletha Alfred Signed: 06/28/2023 4:43:54 PM By: Baltazar Najjar MD Previous Signature: 06/24/2023 5:41:20 PM Version By: Baltazar Najjar MD Entered By: Pearletha Alfred on 06/28/2023 14:59:22

## 2023-06-27 DIAGNOSIS — E1165 Type 2 diabetes mellitus with hyperglycemia: Secondary | ICD-10-CM | POA: Diagnosis not present

## 2023-07-01 ENCOUNTER — Encounter (HOSPITAL_BASED_OUTPATIENT_CLINIC_OR_DEPARTMENT_OTHER): Payer: Medicare HMO | Admitting: Internal Medicine

## 2023-07-01 DIAGNOSIS — Z7901 Long term (current) use of anticoagulants: Secondary | ICD-10-CM | POA: Diagnosis not present

## 2023-07-01 DIAGNOSIS — E1151 Type 2 diabetes mellitus with diabetic peripheral angiopathy without gangrene: Secondary | ICD-10-CM | POA: Diagnosis not present

## 2023-07-01 DIAGNOSIS — E1165 Type 2 diabetes mellitus with hyperglycemia: Secondary | ICD-10-CM | POA: Diagnosis not present

## 2023-07-01 DIAGNOSIS — E1140 Type 2 diabetes mellitus with diabetic neuropathy, unspecified: Secondary | ICD-10-CM | POA: Diagnosis not present

## 2023-07-01 DIAGNOSIS — I110 Hypertensive heart disease with heart failure: Secondary | ICD-10-CM | POA: Diagnosis not present

## 2023-07-01 DIAGNOSIS — Z8673 Personal history of transient ischemic attack (TIA), and cerebral infarction without residual deficits: Secondary | ICD-10-CM | POA: Diagnosis not present

## 2023-07-01 DIAGNOSIS — Z7902 Long term (current) use of antithrombotics/antiplatelets: Secondary | ICD-10-CM | POA: Diagnosis not present

## 2023-07-01 DIAGNOSIS — E11621 Type 2 diabetes mellitus with foot ulcer: Secondary | ICD-10-CM | POA: Diagnosis not present

## 2023-07-01 DIAGNOSIS — E785 Hyperlipidemia, unspecified: Secondary | ICD-10-CM | POA: Diagnosis not present

## 2023-07-01 DIAGNOSIS — I4891 Unspecified atrial fibrillation: Secondary | ICD-10-CM | POA: Diagnosis not present

## 2023-07-01 DIAGNOSIS — G4733 Obstructive sleep apnea (adult) (pediatric): Secondary | ICD-10-CM | POA: Diagnosis not present

## 2023-07-01 DIAGNOSIS — I2510 Atherosclerotic heart disease of native coronary artery without angina pectoris: Secondary | ICD-10-CM | POA: Diagnosis not present

## 2023-07-01 DIAGNOSIS — I872 Venous insufficiency (chronic) (peripheral): Secondary | ICD-10-CM | POA: Diagnosis not present

## 2023-07-01 DIAGNOSIS — I10 Essential (primary) hypertension: Secondary | ICD-10-CM | POA: Diagnosis not present

## 2023-07-01 DIAGNOSIS — L97512 Non-pressure chronic ulcer of other part of right foot with fat layer exposed: Secondary | ICD-10-CM | POA: Diagnosis not present

## 2023-07-01 DIAGNOSIS — E119 Type 2 diabetes mellitus without complications: Secondary | ICD-10-CM | POA: Diagnosis not present

## 2023-07-01 DIAGNOSIS — E7800 Pure hypercholesterolemia, unspecified: Secondary | ICD-10-CM | POA: Diagnosis not present

## 2023-07-01 DIAGNOSIS — I5032 Chronic diastolic (congestive) heart failure: Secondary | ICD-10-CM | POA: Diagnosis not present

## 2023-07-02 ENCOUNTER — Telehealth: Payer: Self-pay

## 2023-07-02 ENCOUNTER — Other Ambulatory Visit: Payer: Self-pay | Admitting: Family

## 2023-07-02 ENCOUNTER — Ambulatory Visit (HOSPITAL_BASED_OUTPATIENT_CLINIC_OR_DEPARTMENT_OTHER)
Admission: RE | Admit: 2023-07-02 | Discharge: 2023-07-02 | Disposition: A | Discharge status: Home / Self Care | Payer: Medicare HMO | Source: Ambulatory Visit | Attending: Family | Admitting: Family

## 2023-07-02 ENCOUNTER — Encounter: Payer: Self-pay | Admitting: Family

## 2023-07-02 ENCOUNTER — Ambulatory Visit (INDEPENDENT_AMBULATORY_CARE_PROVIDER_SITE_OTHER): Payer: Medicare HMO | Admitting: Family

## 2023-07-02 VITALS — BP 144/82 | HR 54 | Ht 70.0 in

## 2023-07-02 DIAGNOSIS — N200 Calculus of kidney: Secondary | ICD-10-CM | POA: Diagnosis not present

## 2023-07-02 DIAGNOSIS — R109 Unspecified abdominal pain: Secondary | ICD-10-CM | POA: Insufficient documentation

## 2023-07-02 DIAGNOSIS — R319 Hematuria, unspecified: Secondary | ICD-10-CM | POA: Diagnosis not present

## 2023-07-02 LAB — POCT URINALYSIS DIPSTICK
Bilirubin, UA: NEGATIVE
Glucose, UA: POSITIVE — AB
Ketones, UA: NEGATIVE
Leukocytes, UA: NEGATIVE
Nitrite, UA: NEGATIVE
Protein, UA: POSITIVE — AB
Spec Grav, UA: 1.01 (ref 1.010–1.025)
Urobilinogen, UA: 0.2 U/dL
pH, UA: 6.5 (ref 5.0–8.0)

## 2023-07-02 LAB — CBC WITH DIFFERENTIAL/PLATELET
Basophils Absolute: 0 10*3/uL (ref 0.0–0.1)
Basophils Relative: 0.6 % (ref 0.0–3.0)
Eosinophils Absolute: 0.2 10*3/uL (ref 0.0–0.7)
Eosinophils Relative: 2.5 % (ref 0.0–5.0)
HCT: 43.8 % (ref 39.0–52.0)
Hemoglobin: 14.5 g/dL (ref 13.0–17.0)
Lymphocytes Relative: 20.2 % (ref 12.0–46.0)
Lymphs Abs: 1.4 10*3/uL (ref 0.7–4.0)
MCHC: 33.1 g/dL (ref 30.0–36.0)
MCV: 87 fL (ref 78.0–100.0)
Monocytes Absolute: 0.7 10*3/uL (ref 0.1–1.0)
Monocytes Relative: 10.9 % (ref 3.0–12.0)
Neutro Abs: 4.4 10*3/uL (ref 1.4–7.7)
Neutrophils Relative %: 65.8 % (ref 43.0–77.0)
Platelets: 241 10*3/uL (ref 150.0–400.0)
RBC: 5.04 Mil/uL (ref 4.22–5.81)
RDW: 16.4 % — ABNORMAL HIGH (ref 11.5–15.5)
WBC: 6.7 10*3/uL (ref 4.0–10.5)

## 2023-07-02 LAB — COMPREHENSIVE METABOLIC PANEL
ALT: 9 U/L (ref 0–53)
AST: 13 U/L (ref 0–37)
Albumin: 4.3 g/dL (ref 3.5–5.2)
Alkaline Phosphatase: 62 U/L (ref 39–117)
BUN: 19 mg/dL (ref 6–23)
CO2: 30 meq/L (ref 19–32)
Calcium: 9.5 mg/dL (ref 8.4–10.5)
Chloride: 102 meq/L (ref 96–112)
Creatinine, Ser: 1.34 mg/dL (ref 0.40–1.50)
GFR: 54.24 mL/min — ABNORMAL LOW (ref >60.00)
Glucose, Bld: 241 mg/dL — ABNORMAL HIGH (ref 70–99)
Potassium: 4.3 meq/L (ref 3.5–5.1)
Sodium: 143 meq/L (ref 135–145)
Total Bilirubin: 0.9 mg/dL (ref 0.2–1.2)
Total Protein: 7.2 g/dL (ref 6.0–8.3)

## 2023-07-02 LAB — PSA: PSA: 1.39 ng/mL (ref 0.10–4.00)

## 2023-07-02 MED ORDER — HYDROCODONE-ACETAMINOPHEN 5-325 MG PO TABS: 1.0000 | ORAL_TABLET | Freq: Four times a day (QID) | ORAL | 0 refills | Status: AC | PRN

## 2023-07-02 NOTE — Progress Notes (Signed)
[name], [name] ([id number]) [id number] Page 1 of 8 Visit Report for 07/01/2023 HPI Details Patient Name: Date of Service: [name], [name]. 07/01/2023 10:45 [name] Medical Record Number: [id number] Patient Account Number: [id number] Date of Birth/Sex: Treating RN: Jul 20, 1956 (67 y.o. M) Primary Care Provider: Arva [name] Other Clinician: Referring Provider: Treating Provider/Extender: [name] in Treatment: 43 History of Present Illness HPI Description: ADMISSION 05/14/2020; this is [name] with multiple medical issues. Predominantly he has type 2 diabetes with [name] and also history of fairly significant PAD. He had [name] in February 2017 he also had an atherectomy and angioplasty by Dr. Allyson [name] of the right popliteal artery in 2016. He is supposed to be getting arterial studies annually however this was interrupted last year because of the pandemic. He tells [place] he was at [facility] 2 weeks ago was getting out of of the scooter and traumatized his left lateral lower leg. There was [name] as the patient is on Plavix and Eliquis. They have been dressing this with Neosporin and doing [name] fairly good job. Wound measures 2.5 x 3.5 it does not have any depth he does not have [name] in his legs outside of surgery however he does have chronic edema and skin changes suggestive of chronic venous disease possibly some degree of lymphedema as well. Past medical history includes type 2 diabetes with peripheral neuropathy and gait instability, lumbar spondylosis, obstructive sleep apnea, history of [name], basal cell skin cancer, atrial fibrillation on anticoagulation, significant PAD as noted with [name] and [name] by Dr. Allyson [name]  in 30th 2016. He also has [name] of coronary artery disease with an MI in 2002 hypertension hyperlipidemia and heart failure with preserved ejection fraction His last arterial studies I can see in epic were on 03/10/2018 this showed [name] and [name] with monophasic waveforms on the right. On the left his ABI was 1.20 with [name] and triphasic waveforms. He has not had arterial studies since. Our nurse in the clinic got an ABI on the left of 1.1 11/2; left anterior leg wound in the setting of chronic venous insufficiency. Wound was initially trauma. We have been using Hydrofera Blue under compression he has home health.. The wound looks [name] lot better today with improvement in surface area 11/16; left anterior leg wound in the setting of chronic venous insufficiency. Wound was initially trauma. We have been using Hydrofera Blue under compression. The patient is closed today. He is supposed to follow-up with vein and vascular with regards to arterial insufficiency nevertheless his leg wounds are 12/3; apparently 2 weeks ago when they were putting on their stockings they managed to get 3 wounds on the left anterior lower leg from abrasion when putting on the stockings. Home health came by the week of Thanksgiving and put Hydrofera Blue 4-layer wrap on this and there is only one superficial area remaining. The patient and his wife complained about the difficulties getting stockings on I think we are using 20/30. We will order bilateral external compression stockings which should be easier. 12/10; wound on the left anterior lower leg is closed. He has chronic venous insufficiency we ordered him Farrow wrap stockings unfortunately he did not bring these in. 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 He says that it has been present since mid December. He is currently residing in [facility] until March  and [name] podiatrist has been looking after him there. They have been simply painting the area with Betadine. He has not had any lower extremity arterial studies since 2019, at which time his right ABI was 0.69. Measured in clinic today, it was 0.71. He is not aware of his most recent hemoglobin A1c, but historically he has had exceptionally poor control. On the basis of his right first metatarsal, there is [name] small crescent shaped wound. There is surrounding eschar and callus. There is no malodor or purulent drainage. 09/08/2022: The original wound is smaller today and fairly clean, but there is some discoloration and [name] pulpy texture to the adjacent callus. Underneath this, the tissue is open exposing the fat layer. It looks like perhaps there was [name] crack in the callus and moisture got under the skin and caused breakdown. 09/15/2022: There has been more moisture related tissue breakdown. The callus is very soft and the underlying tissue is more open. 09/28/2022: The wound looks much better. He has done [name] good job keeping it dry. There is some callus overlying much of the wound surface. There is slough on the exposed open areas. 10/14/2022: There has been [name] lot of moisture related tissue breakdown. He is still forming callus over the top and then it seems that moisture gets underneath the callus and causes tissue damage. There is slough on the exposed wound surface. They will be moving back to the local area from the beach this weekend. 10/21/2022: His foot is less wet, but there is no significant change to his wound. He has developed [name] blister on his left anterior tibial surface. There is no open wound here, but he does have some fairly significant edema. We are planning to apply [name] total contact cast today. 10/23/2022: Here for his obligatory first cast change. He says he has not had any issues wearing the cast or walking in the boot. No detrimental effects on his wound. 10/29/2022: The wound is looking better.  Clearly, putting him in the cast has prevented water from getting in under his callus and causing further tissue breakdown. The blister on his left anterior tibial surface has not yet ruptured. Edema control is significantly improved. 11/05/2022: The wound on his right plantar foot is getting better. There is still some callus accumulation around the wounds, but there are just 2 open areas now, [name] very small 1 on the medial aspect of his metatarsal head and [name] little bit larger 1 on the plantar surface. The blister on his left anterior tibial surface is now open with hanging dry skin. [name], [name] ([id number]) [id number] Page 2 of 8 11/12/2022: The left anterior tibial wound is closed. The wound on his right plantar foot is smaller with just [name] little bit of callus around the edges. 11/19/2022: The right plantar foot wound continues to contract. The surface is quite clean. The tiny satellite area on the medial aspect of his foot has closed. 11/26/2022: The wound is smaller again today. He is responding well to the offloading effects of total contact casting. 12/03/2022: The wound is about the same size today. There is [name] little bit of moisture around the perimeter of the wound. No significant tissue breakdown. 12/10/2022: The wound is smaller today. There has been no moisture-related tissue breakdown of any  maceration. 12/21/2022: The wound continues to contract. Moisture control is excellent. 12/28/2022: The wound is [name] little bit smaller today. 01/05/2023: The wound measures smaller today. Excellent moisture control with zero evidence of maceration. The wound surface is clean with healthy granulation tissue. 01/13/2023: We are leaving his Apligraf in situ for [name] second week. There is no discolored drainage or malodor coming from the site. 01/19/2023: The wound is smaller today. The periwound skin is in good condition without tissue maceration and the healed areas of tissue appear to  have improved integrity. 01/27/2023: Continued contraction of the wound. No tissue maceration. The wound surface is flush with the surrounding skin. 02/02/2023: The wound measured [name] little bit smaller today. There is some bruising just adjacent to the wound, but this has not resulted in any tissue breakdown. 02/09/2023: The wound is smaller again today. The area of bruising that was seen last week has resolved and there has been no tissue damage as [name] result. There is some callus accumulation around the wound edges along with minimal soft slough on the surface. 02/16/2023: The wound measured [name] little bit larger today. There is [name] little bit of maceration around the wound edges, which is likely responsible. 02/23/2023: The wound is smaller today and there is epithelium encroaching around the edges. There is no periwound tissue maceration, nor has he accumulated any significant callus. 03/03/2023: His wound continues to contract. There is no maceration nor any significant callus. There is slight slough on the wound surface. 03/09/2023: The wound is [name] little bit smaller again today. He is managing very well in the peg assist offloading insert and there has been no tissue breakdown or periwound maceration as [name] result of being out of the total contact cast. 03/16/2023: The wound measured smaller again today. He continues to do well using the peg assist offloading insert. 03/23/2023: Although the wound measured the same size today, it visually appears smaller to me. There is good granulation tissue with minimal slough. There is [name] little callus around the edges. 03/30/2023: The wound is smaller today. The granulation tissue is healthy. There is [name] little bit of callus accumulation around the edges. 04/06/2023: Unfortunately, he did not have any assistance to change his dressing during the week and he was also walking substantially more due to attending [name] family event. This has resulted in fairly significant breakdown of  his wound. Much of this seems to be related to moisture accumulation. 04/13/2023: The wound looks markedly better this week after being in the total contact cast. There is no moisture-related tissue breakdown, and the wound opening is smaller. 04/20/2023: The wound is smaller again today. The periwound skin is nice and dry and there is no concern for infection. 04/27/2023: The wound continues to contract. The surface is clean. There is [name] little bit of periwound moisture, but the wound itself has not broken down at all. 05/04/2023: The wound is stable this week. He has had an increase in his left lower leg swelling, however, and he has an intact blister on the distal medial aspect of the leg, adjacent to where his saphenous vein was harvested. 05/11/2023: The wound is [name] little bit narrower from 12-6 o'clock but slightly wider from 9-3 o'clock. It is otherwise fairly clean. The blister on his left lower leg is still present but flat. Edema is improved. 05/18/2023: The blister on his left lower leg has resolved. The wound on his right first metatarsal head is smaller today. There is [name] little bit of  slough, callus, and eschar present. Moisture balance is good. 05/25/2023: His first metatarsal head wound is down to just [name] narrow slit. No concern for infection. 06/01/2023: The wound has contracted further. There is [name] little bit of periwound dry skin and callus. 06/08/2023: The wound is down to just [name] couple of millimeters. There is minimal dry skin and callus around the edges. No tissue maceration. 06/15/2023: The wound surface is covered over with dry skin and callus. Unfortunately, as I began to debride this, purulent drainage emerged. There is no surrounding erythema or induration, nor is there any malodor. 12/5; the area on the first metatarsal head on the right is epithelialized. However it is fragile and would certainly benefit from another week of [name] total contact cast. I think there is also some  subluxation of the first metatarsal head which may make maintenance of healing difficult. 12/12; first metatarsal head is healed. He did not require replacement of [name] cast today. Electronic Signature(s) Signed: 07/01/2023 5:17:16 PM By: Baltazar Najjar MD Entered By: Baltazar Najjar on 07/01/2023 09:13:51 [name], [name] ([id number]) [id number] Page 3 of 8 -------------------------------------------------------------------------------- Physical Exam Details Patient Name: Date of Service: [name], [name]. 07/01/2023 10:45 [name] Medical Record Number: [id number] Patient Account Number: [id number] Date of Birth/Sex: Treating RN: Feb 05, 1956 (67 y.o. M) Primary Care Provider: Arva [name] Other Clinician: Referring Provider: Treating Provider/Extender: [name] in Treatment: 61 Constitutional Patient is hypertensive.. Pulse regular and within target range for patient.[name] Respirations regular, non-labored and within target range.. Temperature is normal and within the target range for the patient.[name] Appears in no distress. Notes Wound exam; everything is healed over the first metatarsal head. I think there is been some subluxation of the first metatarsal head. There is certainly no fat over bone here. I pointed this out to his wife's risk factors for recurrence. No evidence of infection Electronic Signature(s) Signed: 07/01/2023 5:17:16 PM By: Baltazar Najjar MD Entered By: Baltazar Najjar on 07/01/2023 09:15:04 -------------------------------------------------------------------------------- Physician Orders Details Patient Name: Date of Service: [name], [name]. 07/01/2023 10:45 [name] Medical Record Number: [id number] Patient Account Number: [id number] Date of Birth/Sex: Treating RN: August 03, 1955 (67 y.o. Tammy Sours Primary Care Provider: Arva [name] Other Clinician: Referring Provider: Treating Provider/Extender: [name] in Treatment: 49 Verbal / Phone Orders: No Diagnosis Coding Discharge From [facility] Discharge from [facility] - call if any future wound care needs. Edema Control - Orders / Instructions Elevate legs to the level of the heart or above for 30 minutes daily and/or when sitting for 3-4 times [name] day throughout the day. Avoid standing for long periods of time. Patient to wear own compression stockings every day. Off-Loading Other: - wear regular shoe add insole to your shoe. ensure to inspect feet daily every night before bed. wear callous pad at least for the next 3 months. Electronic Signature(s) Signed: 07/01/2023 5:17:16 PM By: Baltazar Najjar MD Signed: 07/01/2023 5:28:21 PM By: Shawn Stall RN, BSN Entered By: Shawn Stall on 07/01/2023 08:18:49 Problem List Details -------------------------------------------------------------------------------- [name] ([id number]) [id number] Page 4 of 8 Patient Name: Date of Service: [name], [name]. 07/01/2023 10:45 [name] Medical Record Number: [id number] Patient Account Number: [id number] Date of Birth/Sex: Treating RN: [birth year] (67 y.o. M) Primary Care Provider: Arva [name] Other Clinician: Referring Provider: Treating Provider/Extender: [name] in Treatment: 43 Active Problems ICD-10 Encounter Code Description Active Date MDM Diagnosis L97.512 Non-pressure chronic ulcer of  other part of right foot with fat layer exposed 08/31/2022 No Yes E11.621 Type 2 diabetes mellitus with foot ulcer 08/31/2022 No Yes I73.9 Peripheral vascular disease, unspecified 08/31/2022 No Yes R60.0 Localized edema 05/04/2023 No Yes I25.10 Atherosclerotic heart disease of native coronary artery without angina pectoris 08/31/2022 No Yes I10 Essential (primary) hypertension 08/31/2022 No Yes E11.65 Type 2 diabetes mellitus with hyperglycemia 08/31/2022 No  Yes E11.40 Type 2 diabetes mellitus with diabetic neuropathy, unspecified 08/31/2022 No Yes I63.9 Cerebral infarction, unspecified 08/31/2022 No Yes Inactive Problems Resolved Problems ICD-10 Code Description Active Date Resolved Date L97.821 Non-pressure chronic ulcer of other part of left lower leg limited to breakdown of skin 11/05/2022 11/05/2022 Electronic Signature(s) Signed: 07/01/2023 5:17:16 PM By: Baltazar Najjar MD Entered By: Baltazar Najjar on 07/01/2023 09:12:41 -------------------------------------------------------------------------------- Progress Note Details Patient Name: Date of Service: [name], [name]. 07/01/2023 10:45 [name] Renee Harder, Keimari [name] ([id number]) [id number] Page 5 of 8 Medical Record Number: [id number] Patient Account Number: [id number] Date of Birth/Sex: Treating RN: January 13, 1956 (67 y.o. M) Primary Care Provider: Arva [name] Other Clinician: Referring Provider: Treating Provider/Extender: [name] in Treatment: 43 Subjective History of Present Illness (HPI) ADMISSION 05/14/2020; this is [name] with multiple medical issues. Predominantly he has type 2 diabetes with [name] and also history of fairly significant PAD. He had [name] in February 2017 he also had an atherectomy and angioplasty by Dr. Allyson [name] of the right popliteal artery in 2016. He is supposed to be getting arterial studies annually however this was interrupted last year because of the pandemic. He tells [place] he was at [facility] 2 weeks ago was getting out of of the scooter and traumatized his left lateral lower leg. There was [name] as the patient is on Plavix and Eliquis. They have been dressing this with Neosporin and doing [name] fairly good job. Wound measures 2.5 x 3.5 it does not have any depth he does not have [name] in his legs  outside of surgery however he does have chronic edema and skin changes suggestive of chronic venous disease possibly some degree of lymphedema as well. Past medical history includes type 2 diabetes with peripheral neuropathy and gait instability, lumbar spondylosis, obstructive sleep apnea, history of [name], basal cell skin cancer, atrial fibrillation on anticoagulation, significant PAD as noted with [name] and [name] by Dr. Allyson [name] in 30th 2016. He also has [name] of coronary artery disease with an MI in 2002 hypertension hyperlipidemia and heart failure with preserved ejection fraction His last arterial studies I can see in epic were on 03/10/2018 this showed [name] and [name] with monophasic waveforms on the right. On the left his ABI was 1.20 with [name] and triphasic waveforms. He has not had arterial studies since. Our nurse in the clinic got an ABI on the left of 1.1 11/2; left anterior leg wound in the setting of chronic venous insufficiency. Wound was initially trauma. We have been using Hydrofera Blue under compression he has home health.. The wound looks [name] lot better today with improvement in surface area 11/16; left anterior leg wound in the setting of chronic venous insufficiency. Wound was initially trauma. We have been using Hydrofera Blue under compression. The patient is closed today. He is  supposed to follow-up with vein and vascular with regards to arterial insufficiency nevertheless his leg wounds are 12/3; apparently 2 weeks ago when they were putting on their stockings they managed to get 3 wounds on the left anterior lower leg from abrasion when putting on the stockings. Home health came by the week of Thanksgiving and put Hydrofera Blue 4-layer wrap on this and there is only one superficial area remaining. The patient and his wife  complained about the difficulties getting stockings on I think we are using 20/30. We will order bilateral external compression stockings which should be easier. 12/10; wound on the left anterior lower leg is closed. He has chronic venous insufficiency we ordered him Farrow wrap stockings unfortunately he did not bring these in. 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 He says that it has been present since mid December. He is currently residing in [facility] until March and [name] podiatrist has been looking after him there. They have been simply painting the area with Betadine. He has not had any lower extremity arterial studies since 2019, at which time his right ABI was 0.69. Measured in clinic today, it was 0.71. He is not aware of his most recent hemoglobin A1c, but historically he has had exceptionally poor control. On the basis of his right first metatarsal, there is [name] small crescent shaped wound. There is surrounding eschar and callus. There is no malodor or purulent drainage. 09/08/2022: The original wound is smaller today and fairly clean, but there is some discoloration and [name] pulpy texture to the adjacent callus. Underneath this, the tissue is open exposing the fat layer. It looks like perhaps there was [name] crack in the callus and moisture got under the skin and caused breakdown. 09/15/2022: There has been more moisture related tissue breakdown. The callus is very soft and the underlying tissue is more open. 09/28/2022: The wound looks much better. He has done [name] good job keeping it dry. There is some callus overlying much of the wound surface. There is slough on the exposed open areas. 10/14/2022: There has been [name] lot of moisture related tissue breakdown. He is still forming callus over the top and then it seems that moisture gets underneath the callus and causes tissue damage. There is slough on the exposed wound surface. They  will be moving back to the local area from the beach this weekend. 10/21/2022: His foot is less wet, but there is no significant change to his wound. He has developed [name] blister on his left anterior tibial surface. There is no open wound here, but he does have some fairly significant edema. We are planning to apply [name] total contact cast today. 10/23/2022: Here for his obligatory first cast change. He says he has not had any issues wearing the cast or walking in the boot. No detrimental effects on his wound. 10/29/2022: The wound is looking better. Clearly, putting him in the cast has prevented water from getting in under his callus and causing further tissue breakdown. The blister on his left anterior tibial surface has not yet ruptured. Edema control is significantly improved. 11/05/2022: The wound on his right plantar foot is getting better. There is still some callus accumulation around the wounds, but there are just 2 open areas now, [name] very small 1 on the medial aspect of his metatarsal head and [name] little bit larger 1 on the plantar surface. The blister on his left anterior  tibial surface is now open with hanging dry skin. 11/12/2022: The left anterior tibial wound is closed. The wound on his right plantar foot is smaller with just [name] little bit of callus around the edges. 11/19/2022: The right plantar foot wound continues to contract. The surface is quite clean. The tiny satellite area on the medial aspect of his foot has closed. 11/26/2022: The wound is smaller again today. He is responding well to the offloading effects of total contact casting. 12/03/2022: The wound is about the same size today. There is [name] little bit of moisture around the perimeter of the wound. No significant tissue breakdown. 12/10/2022: The wound is smaller today. There has been no moisture-related tissue breakdown of any maceration. 12/21/2022: The wound continues to contract. Moisture control is excellent. 12/28/2022: The wound is [name] little  bit smaller today. 01/05/2023: The wound measures smaller today. Excellent moisture control with zero evidence of maceration. The wound surface is clean with healthy [name], [name] ([id number]) [phone].pdf Page 6 of 8 granulation tissue. 01/13/2023: We are leaving his Apligraf in situ for [name] second week. There is no discolored drainage or malodor coming from the site. 01/19/2023: The wound is smaller today. The periwound skin is in good condition without tissue maceration and the healed areas of tissue appear to have improved integrity. 01/27/2023: Continued contraction of the wound. No tissue maceration. The wound surface is flush with the surrounding skin. 02/02/2023: The wound measured [name] little bit smaller today. There is some bruising just adjacent to the wound, but this has not resulted in any tissue breakdown. 02/09/2023: The wound is smaller again today. The area of bruising that was seen last week has resolved and there has been no tissue damage as [name] result. There is some callus accumulation around the wound edges along with minimal soft slough on the surface. 02/16/2023: The wound measured [name] little bit larger today. There is [name] little bit of maceration around the wound edges, which is likely responsible. 02/23/2023: The wound is smaller today and there is epithelium encroaching around the edges. There is no periwound tissue maceration, nor has he accumulated any significant callus. 03/03/2023: His wound continues to contract. There is no maceration nor any significant callus. There is slight slough on the wound surface. 03/09/2023: The wound is [name] little bit smaller again today. He is managing very well in the peg assist offloading insert and there has been no tissue breakdown or periwound maceration as [name] result of being out of the total contact cast. 03/16/2023: The wound measured smaller again today. He continues to do well using the peg assist offloading insert. 03/23/2023:  Although the wound measured the same size today, it visually appears smaller to me. There is good granulation tissue with minimal slough. There is [name] little callus around the edges. 03/30/2023: The wound is smaller today. The granulation tissue is healthy. There is [name] little bit of callus accumulation around the edges. 04/06/2023: Unfortunately, he did not have any assistance to change his dressing during the week and he was also walking substantially more due to attending [name] family event. This has resulted in fairly significant breakdown of his wound. Much of this seems to be related to moisture accumulation. 04/13/2023: The wound looks markedly better this week after being in the total contact cast. There is no moisture-related tissue breakdown, and the wound opening is smaller. 04/20/2023: The wound is smaller again today. The periwound skin is nice and dry and there is no concern for infection. 04/27/2023:  The wound continues to contract. The surface is clean. There is [name] little bit of periwound moisture, but the wound itself has not broken down at all. 05/04/2023: The wound is stable this week. He has had an increase in his left lower leg swelling, however, and he has an intact blister on the distal medial aspect of the leg, adjacent to where his saphenous vein was harvested. 05/11/2023: The wound is [name] little bit narrower from 12-6 o'clock but slightly wider from 9-3 o'clock. It is otherwise fairly clean. The blister on his left lower leg is still present but flat. Edema is improved. 05/18/2023: The blister on his left lower leg has resolved. The wound on his right first metatarsal head is smaller today. There is [name] little bit of slough, callus, and eschar present. Moisture balance is good. 05/25/2023: His first metatarsal head wound is down to just [name] narrow slit. No concern for infection. 06/01/2023: The wound has contracted further. There is [name] little bit of periwound dry skin and callus. 06/08/2023: The  wound is down to just [name] couple of millimeters. There is minimal dry skin and callus around the edges. No tissue maceration. 06/15/2023: The wound surface is covered over with dry skin and callus. Unfortunately, as I began to debride this, purulent drainage emerged. There is no surrounding erythema or induration, nor is there any malodor. 12/5; the area on the first metatarsal head on the right is epithelialized. However it is fragile and would certainly benefit from another week of [name] total contact cast. I think there is also some subluxation of the first metatarsal head which may make maintenance of healing difficult. 12/12; first metatarsal head is healed. He did not require replacement of [name] cast today. Objective Constitutional Patient is hypertensive.. Pulse regular and within target range for patient.[name] Respirations regular, non-labored and within target range.. Temperature is normal and within the target range for the patient.[name] Appears in no distress. Vitals Time Taken: 11:02 AM, Height: 70 in, Weight: 260 lbs, BMI: 37.3, Temperature: 97.8 F, Pulse: 60 bpm, Respiratory Rate: 18 breaths/min, Blood Pressure: 179/69 mmHg. General Notes: Wound exam; everything is healed over the first metatarsal head. I think there is been some subluxation of the first metatarsal head. There is certainly no fat over bone here. I pointed this out to his wife's risk factors for recurrence. No evidence of infection Integumentary (Hair, Skin) Wound #2 status is Healed - Epithelialized. Original cause of wound was Gradually Appeared. The date acquired was: 07/08/2022. The wound has been in treatment 43 weeks. The wound is located on the Right,Plantar Foot. The wound measures 0cm length x 0cm width x 0cm depth; 0cm^2 area and 0cm^3 volume. There is no tunneling or undermining noted. There is [name] none present amount of drainage noted. The wound margin is distinct with the outline attached to the wound base. There is no  granulation within the wound bed. There is no necrotic tissue within the wound bed. The periwound skin appearance had no Kuenzel, Jordon [name] ([id number]) [phone].pdf Page 7 of 8 abnormalities noted for moisture. The periwound skin appearance had no abnormalities noted for color. The periwound skin appearance exhibited: Callus. Periwound temperature was noted as No Abnormality. 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 Edema Control - Orders / Instructions: Elevate legs to the level of the heart or above for 30 minutes daily and/or when sitting for 3-4 times [name] day throughout the day. Avoid standing for long periods of time. Patient to wear own compression stockings every day. Off-Loading: Other: - wear regular shoe add insole to your shoe. ensure to inspect feet daily every night before bed. wear callous pad at least for the next 3 months. 1. The patient did not require total contact cast 2. He has [name] shoe with insoles and callus pads to apply to this area. 3. The patient to be discharged today Electronic Signature(s) Signed: 07/01/2023 5:17:16 PM By: Baltazar Najjar MD Entered By: Baltazar Najjar on 07/01/2023 09:15:55 -------------------------------------------------------------------------------- SuperBill Details Patient Name: Date of Service: [name] [name]. 07/01/2023 Medical Record Number: [id number] Patient Account Number: [id number] Date  of Birth/Sex: Treating RN: 1956-06-22 (67 y.o. Harlon Flor, Yvonne Kendall Primary Care Provider: Arva [name] Other Clinician: Referring Provider: Treating Provider/Extender: [name] in Treatment: 43 Diagnosis Coding ICD-10 Codes Code Description [phone] Non-pressure chronic ulcer of other part of right foot with fat layer exposed E11.621 Type 2 diabetes mellitus with foot ulcer I73.9 Peripheral vascular disease, unspecified R60.0 Localized edema I25.10 Atherosclerotic heart disease of native coronary artery without angina pectoris I10 Essential (primary) hypertension E11.65 Type 2 diabetes mellitus with hyperglycemia E11.40 Type 2 diabetes mellitus with diabetic neuropathy, unspecified Hypolite, Nash [name] ([id number]) [id number].pdf Page 8 of 8 I63.9 Cerebral infarction, unspecified Facility Procedures : CPT4 Code: [id number] Description: 99213 - WOUND CARE VISIT-LEV 3 EST PT Modifier: Quantity: 1 Physician Procedures : CPT4 Code Description Modifier [id number] 99213 - WC PHYS LEVEL 3 - EST PT ICD-10 Diagnosis Description L97.512 Non-pressure chronic ulcer of other part of right foot with fat layer exposed E11.621 Type 2 diabetes mellitus with foot ulcer Quantity: 1 Electronic Signature(s) Signed: 07/01/2023 5:17:16 PM By: Baltazar Najjar MD Entered By: Baltazar Najjar on 07/01/2023 09:16:13

## 2023-07-02 NOTE — Telephone Encounter (Signed)
Caller Name Jesses Ficca Caller Phone Number [phone] Patient Name [name] Patient DOB Dec 13, 1955 Call Type Message Only Information Provided Reason for Call Request to Schedule Office Appointment Initial Comment Caller states that he would like to know what time to call to speak to someone about scheduling. He seems to have a kidney infection. He is urinating blood. Disp. Time Disposition Final User 07/02/2023 7:03:40 AM General Information Provided Yes Cherylynn Ridges Call Closed By: Cherylynn Ridges Transaction Date/Time: 07/02/2023 7:01:20 AM (ET)
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
## 2023-07-02 NOTE — Telephone Encounter (Signed)
Pt called and spoke with wife , pt scheduled for 07/02/23 at 1120 with Vernona Rieger

## 2023-07-02 NOTE — Progress Notes (Signed)
Cangemi, Coady [name] [id number]) [id number] Page 1 of 8 Visit Report for 07/01/2023 Arrival Information Details Patient Name: Date of Service: [name], [name]. 07/01/2023 10:45 [name] Medical Record Number: [id number] Patient Account Number: [id number] Date of Birth/Sex: Treating RN: [birth year] (67 y.o. [name]) Primary Care [name]: [name] Other Clinician: Referring Terea Neubauer: Treating Malka Bocek/Extender: Lynnell Catalan in Treatment: 43 Visit Information History Since Last Visit Added or deleted any medications: No Patient Arrived: Ambulatory Any new allergies or adverse reactions: No Arrival Time: 11:00 Had [name] fall or experienced change in No Accompanied By: wife activities of daily living that may affect Transfer Assistance: None risk of falls: Patient Identification Verified: Yes Signs or symptoms of abuse/neglect since last visito No Secondary Verification Process Completed: Yes Hospitalized since last visit: No Patient Requires Transmission-Based Precautions: No Implantable device outside of the clinic excluding No Patient Has Alerts: No cellular tissue based products placed in the center since last visit: Pain Present Now: No Electronic Signature(s) Signed: 07/01/2023 1:16:13 PM By: Dayton Scrape Entered By: Dayton Scrape on 07/01/2023 11:02:02 -------------------------------------------------------------------------------- Clinic Level of Care Assessment Details Patient Name: Date of Service: [name], [name]. 07/01/2023 10:45 [name] Medical Record Number: [id number] Patient Account Number: [id number] Date of Birth/Sex: Treating RN: 07/25/1955 (67 y.o. Harlon Flor, Millard.Loa Primary Care Korie Streat: [name] Other Clinician: Referring Rayfield Beem: Treating Ashlen Kiger/Extender: Lynnell Catalan in Treatment: 43 Clinic Level of Care Assessment Items TOOL 4 Quantity Score X- 1 0 Use when only an EandM is  performed on FOLLOW-UP visit ASSESSMENTS - Nursing Assessment / Reassessment X- 1 10 Reassessment of Co-morbidities (includes updates in patient status) X- 1 5 Reassessment of Adherence to Treatment Plan ASSESSMENTS - Wound and Skin [name] ssessment / Reassessment X - Simple Wound Assessment / Reassessment - one wound 1 5 []  - 0 Complex Wound Assessment / Reassessment - multiple wounds X- 1 10 Dermatologic / Skin Assessment (not related to wound area) ASSESSMENTS - Focused Assessment X- 1 5 Circumferential Edema Measurements - multi extremities []  - 0 Nutritional Assessment / Counseling / Intervention []  - 0 Lower Extremity Assessment (monofilament, tuning fork, pulses) Devin, Bishop [name] ([id number]) [id number] Page 2 of 8 []  - 0 Peripheral Arterial Disease Assessment (using hand held doppler) ASSESSMENTS - Ostomy and/or Continence Assessment and Care []  - 0 Incontinence Assessment and Management []  - 0 Ostomy Care Assessment and Management (repouching, etc.) PROCESS - Coordination of Care X - Simple Patient / Family Education for ongoing care 1 15 []  - 0 Complex (extensive) Patient / Family Education for ongoing care X- 1 10 Staff obtains Chiropractor, Records, T Results / Process Orders est []  - 0 Staff telephones HHA, Nursing Homes / Clarify orders / etc []  - 0 Routine Transfer to another Facility (non-emergent condition) []  - 0 Routine Hospital Admission (non-emergent condition) []  - 0 New Admissions / Manufacturing engineer / Ordering NPWT Apligraf, etc. , []  - 0 Emergency Hospital Admission (emergent condition) X- 1 10 Simple Discharge Coordination []  - 0 Complex (extensive) Discharge Coordination PROCESS - Special Needs []  - 0 Pediatric / Minor Patient Management []  - 0 Isolation Patient Management []  - 0 Hearing / Language / Visual special needs []  - 0 Assessment of Community assistance (transportation, D/C planning, etc.) []  -  0 Additional assistance / Altered mentation []  - 0 Support Surface(s) Assessment (bed, cushion, seat, etc.) INTERVENTIONS - Wound Cleansing / Measurement X - Simple Wound Cleansing - one wound 1 5 []  - 0 Complex  Wound Cleansing - multiple wounds X- 1 5 Wound Imaging (photographs - any number of wounds) []  - 0 Wound Tracing (instead of photographs) X- 1 5 Simple Wound Measurement - one wound []  - 0 Complex Wound Measurement - multiple wounds INTERVENTIONS - Wound Dressings []  - 0 Small Wound Dressing one or multiple wounds []  - 0 Medium Wound Dressing one or multiple wounds []  - 0 Large Wound Dressing one or multiple wounds []  - 0 Application of Medications - topical []  - 0 Application of Medications - injection INTERVENTIONS - Miscellaneous []  - 0 External ear exam []  - 0 Specimen Collection (cultures, biopsies, blood, body fluids, etc.) []  - 0 Specimen(s) / Culture(s) sent or taken to Lab for analysis []  - 0 Patient Transfer (multiple staff / Nurse, adult / Similar devices) []  - 0 Simple Staple / Suture removal (25 or less) []  - 0 Complex Staple / Suture removal (26 or more) []  - 0 Hypo / Hyperglycemic Management (close monitor of Blood Glucose) []  - 0 Ankle / Brachial Index (ABI) - do not check if billed separately [name], [name] ([id number]) [id number] Page 3 of 8 X- 1 5 Vital Signs Has the patient been seen at the hospital within the last three years: Yes Total Score: 90 Level Of Care: New/Established - Level 3 Electronic Signature(s) Signed: 07/01/2023 5:28:21 PM By: Shawn Stall RN, BSN Entered By: Shawn Stall on 07/01/2023 11:19:47 -------------------------------------------------------------------------------- Encounter Discharge Information Details Patient Name: Date of Service: [name], [name]. 07/01/2023 10:45 [name] Medical Record Number: [id number] Patient Account Number: [id number] Date of Birth/Sex: Treating RN: 04-06-1956  (67 y.o. [name] Primary Care Chaim Gatley: [name] Other Clinician: Referring Kieara Schwark: Treating Amran Malter/Extender: Lynnell Catalan in Treatment: 75 Encounter Discharge Information Items Discharge Condition: Stable Ambulatory Status: Ambulatory Discharge Destination: Home Transportation: Private Auto Accompanied By: family Schedule Follow-up Appointment: No Clinical Summary of Care: Electronic Signature(s) Signed: 07/01/2023 5:28:21 PM By: Shawn Stall RN, BSN Entered By: Shawn Stall on 07/01/2023 11:20:31 -------------------------------------------------------------------------------- Lower Extremity Assessment Details Patient Name: Date of Service: Askin, [name]. 07/01/2023 10:45 [name] Medical Record Number: [id number] Patient Account Number: [id number] Date of Birth/Sex: Treating RN: 07/06/1956 (67 y.o. [name] Primary Care Kamarrion Stfort: [name] Other Clinician: Referring Eastyn Skalla: Treating Rae Sutcliffe/Extender: Lynnell Catalan in Treatment: 43 Edema Assessment Assessed: [name]: No] [name]: Yes] Edema: [Left: N] [Right: o] Calf Left: Right: Point of Measurement: From Medial Instep 38 cm Ankle Left: Right: Point of Measurement: From Medial Instep 28 cm Vascular Assessment Pulses: Droege, Hobson [name] ([id number]) [Right:[id number] Page 4 of 8] Dorsalis Pedis Palpable: [Right:Yes] Extremity colors, hair growth, and conditions: Extremity Color: [Right:Normal] Hair Growth on Extremity: [Right:Yes] Temperature of Extremity: [Right:Warm] Capillary Refill: [Right:< 3 seconds] Dependent Rubor: [Right:No] Blanched when Elevated: [Right:No No] Toe Nail Assessment Left: Right: Thick: Yes Discolored: Yes Deformed: Yes Improper Length and Hygiene: Yes Electronic Signature(s) Signed: 07/01/2023 5:28:21 PM By: Shawn Stall RN, BSN Entered By: Shawn Stall on 07/01/2023  11:14:42 -------------------------------------------------------------------------------- Multi Wound Chart Details Patient Name: Date of Service: [name], [name]. 07/01/2023 10:45 [name] Medical Record Number: [id number] Patient Account Number: [id number] Date of Birth/Sex: Treating RN: 1955/09/17 (67 y.o. [name]) Primary Care Debie Ashline: [name] Other Clinician: Referring Jemuel Laursen: Treating Rigel Filsinger/Extender: Lynnell Catalan in Treatment: 43 Vital Signs Height(in): 70 Pulse(bpm): 60 Weight(lbs): 260 Blood Pressure(mmHg): 179/69 Body Mass Index(BMI): 37.3 Temperature(F): 97.8 Respiratory Rate(breaths/min): 18 [2:Photos:] [[name]:[name]] Right, Plantar Foot [name] [name] Wound Location:  Gradually Appeared [name] [name] Wounding Event: Diabetic Wound/Ulcer of the Lower [name] [name] Primary Etiology: Extremity Sleep Apnea, Arrhythmia, Coronary [name] [name] Comorbid History: Artery Disease, Deep Vein Thrombosis, Hypertension, Myocardial Infarction, Peripheral Arterial Disease, Type II Diabetes, Neuropathy 07/08/2022 [name] [name] Date Acquired: 42 [name] [name] Weeks of Treatment: Healed - Epithelialized [name] [name] Wound Status: No [name] [name] Wound Recurrence: 0x0x0 [name] [name] Measurements L x W x D (cm) 0 [name] [name] [name] : rea 0 [name] [name] Volume (cm) : 100.00% [name] [name] % Reduction in Area: 100.00% [name] [name] % Reduction in Volume: Grade 1 [name] [name] Classification: [name], [name] ([id number]) [id number] Page 5 of 8 None Present [name] [name] Exudate Amount: Distinct, outline attached [name] [name] Wound Margin: None Present (0%) [name] [name] Granulation Amount: None Present (0%) [name] [name] Necrotic Amount: Fascia: No [name] [name] Exposed Structures: Fat Layer (Subcutaneous Tissue): No Tendon: No Muscle: No Joint: No Bone: No Large (67-100%) [name] [name] Epithelialization: Callus: Yes [name] [name] Periwound Skin Texture: Maceration: Yes [name] [name] Periwound Skin Moisture: Dry/Scaly: No No  Abnormalities Noted [name] [name] Periwound Skin Color: No Abnormality [name] [name] Temperature: Treatment Notes Electronic Signature(s) Signed: 07/01/2023 5:17:16 PM By: Baltazar Najjar MD Entered By: Baltazar Najjar on 07/01/2023 12:12:53 -------------------------------------------------------------------------------- Multi-Disciplinary Care Plan Details Patient Name: Date of Service: [name] Sabal [name]. 07/01/2023 10:45 [name] Medical Record Number: [id number] Patient Account Number: [id number] Date of Birth/Sex: Treating RN: 03/06/1956 (67 y.o. [name] Primary Care Pankaj Haack: [name] Other Clinician: Referring Misti Towle: Treating Kashif Pooler/Extender: Lynnell Catalan in Treatment: 24 Multidisciplinary Care Plan reviewed with physician Active Inactive Electronic Signature(s) Signed: 07/01/2023 5:28:21 PM By: Shawn Stall RN, BSN Entered By: Shawn Stall on 07/01/2023 11:18:56 -------------------------------------------------------------------------------- Pain Assessment Details Patient Name: Date of Service: [name], [name]. 07/01/2023 10:45 [name] Medical Record Number: [id number] Patient Account Number: [id number] Date of Birth/Sex: Treating RN: 03-Aug-1955 (67 y.o. [name]) Primary Care Alvaretta Eisenberger: [name] Other Clinician: Referring Nolawi Kanady: Treating Haydin Calandra/Extender: Lynnell Catalan in Treatment: 43 Active Problems Location of Pain Severity and Description of Pain Patient Has Paino No Site Locations [name], [name] ([id number]) [id number] Page 6 of 8 Pain Management and Medication Current Pain Management: Electronic Signature(s) Signed: 07/01/2023 1:16:13 PM By: Dayton Scrape Entered By: Dayton Scrape on 07/01/2023 11:02:43 -------------------------------------------------------------------------------- Patient/Caregiver Education Details Patient Name: Date of Service: [name] Sabal [name].  12/12/2024andnbsp10:45 [name] Medical Record Number: [id number] Patient Account Number: [id number] Date of Birth/Gender: Treating RN: Oct 25, 1955 (67 y.o. [name] Primary Care Physician: [name] Other Clinician: Referring Physician: Treating Physician/Extender: Lynnell Catalan in Treatment: 90 Education Assessment Education Provided To: Patient Education Topics Provided Wound/Skin Impairment: Handouts: Caring for Your Ulcer Methods: Explain/Verbal Responses: Reinforcements needed Electronic Signature(s) Signed: 07/01/2023 5:28:21 PM By: Shawn Stall RN, BSN Entered By: Shawn Stall on 07/01/2023 11:19:20 -------------------------------------------------------------------------------- Wound Assessment Details Patient Name: Date of Service: [name], [name]. 07/01/2023 10:45 [name] Medical Record Number: [id number] Patient Account Number: [id number] Date of Birth/Sex: Treating RN: 04-23-1956 (67 y.o. [name] Primary Care Syenna Nazir: [name] Other Clinician: [name] ([id number]) [id number] Page 7 of 8 Referring Ayodele Sangalang: Treating [name] Foots/Extender: Lynnell Catalan in Treatment: 43 Wound Status Wound Number: 2 Primary Diabetic Wound/Ulcer of the Lower Extremity Etiology: Wound Location: Right, Plantar Foot Wound Healed - Epithelialized Wounding Event: Gradually Appeared Status: Date Acquired: 07/08/2022 Comorbid Sleep Apnea, Arrhythmia, Coronary Artery Disease, Deep Vein Weeks Of Treatment: 43 History: Thrombosis, Hypertension,  Myocardial Infarction, Peripheral Arterial Clustered Wound: No Disease, Type II Diabetes, Neuropathy Photos Wound Measurements Length: (cm) Width: (cm) Depth: (cm) Area: (cm) Volume: (cm) 0 % Reduction in Area: 100% 0 % Reduction in Volume: 100% 0 Epithelialization: Large (67-100%) 0 Tunneling: No 0 Undermining: No Wound  Description Classification: Grade 1 Wound Margin: Distinct, outline attached Exudate Amount: None Present Foul Odor After Cleansing: No Slough/Fibrino No Wound Bed Granulation Amount: None Present (0%) Exposed Structure Necrotic Amount: None Present (0%) Fascia Exposed: No Fat Layer (Subcutaneous Tissue) Exposed: No Tendon Exposed: No Muscle Exposed: No Joint Exposed: No Bone Exposed: No Periwound Skin Texture Texture Color No Abnormalities Noted: No No Abnormalities Noted: Yes Callus: Yes Temperature / Pain Temperature: No Abnormality Moisture No Abnormalities Noted: Yes Electronic Signature(s) Signed: 07/01/2023 5:28:21 PM By: Shawn Stall RN, BSN Entered By: Shawn Stall on 07/01/2023 11:14:05 -------------------------------------------------------------------------------- Vitals Details Patient Name: Date of Service: [name], [name]. 07/01/2023 10:45 [name] Medical Record Number: [id number] Patient Account Number: [id number] Date of Birth/Sex: Treating RN: 10-04-1955 (67 y.o. [name]) Primary Care Rondi Ivy: [name] Other Clinician: Referring Matheson Vandehei: Treating Jennife Zaucha/Extender: Karie Georges, Trey [name] ([id number]) [id number] Page 8 of 8 Weeks in Treatment: 43 Vital Signs Time Taken: 11:02 Temperature (F): 97.8 Height (in): 70 Pulse (bpm): 60 Weight (lbs): 260 Respiratory Rate (breaths/min): 18 Body Mass Index (BMI): 37.3 Blood Pressure (mmHg): 179/69 Reference Range: 80 - 120 mg / dl Electronic Signature(s) Signed: 07/01/2023 1:16:13 PM By: Dayton Scrape Entered By: Dayton Scrape on 07/01/2023 11:02:34

## 2023-07-03 LAB — URINE CULTURE
MICRO NUMBER:: 15848303
Result:: NO GROWTH
SPECIMEN QUALITY:: ADEQUATE

## 2023-07-06 ENCOUNTER — Ambulatory Visit (HOSPITAL_BASED_OUTPATIENT_CLINIC_OR_DEPARTMENT_OTHER): Payer: Medicare HMO | Admitting: General Surgery

## 2023-07-07 ENCOUNTER — Encounter: Payer: Self-pay | Admitting: Urology

## 2023-07-07 ENCOUNTER — Ambulatory Visit: Payer: Medicare HMO | Admitting: Urology

## 2023-07-07 ENCOUNTER — Ambulatory Visit (INDEPENDENT_AMBULATORY_CARE_PROVIDER_SITE_OTHER): Payer: Medicare HMO | Admitting: Neurology

## 2023-07-07 VITALS — BP 192/76 | HR 63 | Ht 70.0 in | Wt 265.0 lb

## 2023-07-07 DIAGNOSIS — G4733 Obstructive sleep apnea (adult) (pediatric): Secondary | ICD-10-CM

## 2023-07-07 DIAGNOSIS — R0609 Other forms of dyspnea: Secondary | ICD-10-CM

## 2023-07-07 DIAGNOSIS — I739 Peripheral vascular disease, unspecified: Secondary | ICD-10-CM

## 2023-07-07 DIAGNOSIS — R399 Unspecified symptoms and signs involving the genitourinary system: Secondary | ICD-10-CM | POA: Diagnosis not present

## 2023-07-07 DIAGNOSIS — I2510 Atherosclerotic heart disease of native coronary artery without angina pectoris: Secondary | ICD-10-CM

## 2023-07-07 DIAGNOSIS — R310 Gross hematuria: Secondary | ICD-10-CM

## 2023-07-07 DIAGNOSIS — Z8669 Personal history of other diseases of the nervous system and sense organs: Secondary | ICD-10-CM

## 2023-07-07 LAB — URINALYSIS, ROUTINE W REFLEX MICROSCOPIC
Bilirubin, UA: NEGATIVE
Glucose, UA: NEGATIVE
Ketones, UA: NEGATIVE
Leukocytes,UA: NEGATIVE
Nitrite, UA: NEGATIVE
Specific Gravity, UA: 1.02 (ref 1.005–1.030)
Urobilinogen, Ur: 0.2 mg/dL (ref 0.2–1.0)
pH, UA: 7 (ref 5.0–7.5)

## 2023-07-07 LAB — MICROSCOPIC EXAMINATION
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
## 2023-07-08 DIAGNOSIS — G4733 Obstructive sleep apnea (adult) (pediatric): Secondary | ICD-10-CM | POA: Diagnosis not present

## 2023-07-08 NOTE — Progress Notes (Signed)
[facility] Sprenkle 67 year old male 04-23-1956   HOME SLEEP TEST REPORT ( by Watch PAT)   STUDY DATE:  07-08-2023= Data  from mail-out TEST device    ORDERING CLINICIAN:  REFERRING CLINICIAN: PCP Arva Chafe, DO    CLINICAL INFORMATION/HISTORY: " My machine displayed a message, is going to stop working" . Patient presented with severe ankle edema,  crowded teeth, had bypass surgery.   Mr. Philis Nettle had been referred as a patient of Dr. Delia Heady for a full night polysomnography study in 2018  and was able to sleep for about 319 minutes.  The apnea hypopnea index was 16.6/h which would be considered mild to moderate but it was clustered in supine sleep.  Actually every sleep apnea that we measured was seen in supine sleep.  He had 6 apneas in non-REM sleep, 3 apneas and REM sleep a total of an AHI of 53.3 when in supine and a total of 9.3 apneas in non-REM sleep supine.  So his so his main vulnerability with the REM sleep dependent sleep apnea which has to be treated with positive airway pressure.    He returned for in lab CPAP titration- FFM-  He became a highly compliant user of CPAP and his current download shows 100% compliance with an average of 9-1/2 hours use at night.  CPAP is set still to 12 cm water pressure with 3 cm expiratory relief.  The residual AHI here is 7.3/h and these are all obstructive residuals.  The 95th percentile air leak is very low which means that his interface is fitting well.  95th percentile pressure will not be determined by unknown autotitrator but the air leak was 0.5 L a minute only.   History from 2017, referral by Stroke MD: Mr. Wallack is seen here today in the presence of his wife, who has noticed him to stop breathing at night and has witnessed snoring. Had a stroke in 2017, is diabetic and severely overweight.     Epworth sleepiness score:  9/ 24 points - sleeps on and off many hours in the day.   FSS endorsed at 33/ 63  points.    BMI: 37 kg/m   Neck Circumference: 20"    FINDINGS:   Sleep Summary by AASM criteria:   Total Recording Time (hours, min): 9 hours 7 minutes      Total Sleep Time (hours, min):    7 hours 41 minutes             Percent REM (%):    17%                                    Respiratory Indices:   Calculated pAHI (per hour):    45.3/h and 27.8/h by CMS criteria                         REM pAHI:     40/h                                            NREM pAHI: 47/h  Supine AHI: There were 400 minutes of supine sleep recorded with an AHI by AASM criteria of 45/h (24/h following CMS criteria ).                                                Oxygen Saturation Statistics:   Oxygen Saturation (%) Mean:    92%             O2 Saturation Range (%):    Between the nadir at 81 with a maximum saturation of 98%.                                   O2 Saturation (minutes) <89%:     22 minutes O2 saturation in minutes<90%:     45 minutes      Pulse Rate Statistics:   Pulse Mean (bpm):    44 bpm, clearly bradycardic             Pulse Range:   Between 30 bpm with a maximum of 83 bpm.              IMPRESSION:  This HST confirms the persistent presence of severe sleep apnea which the device here interpreted as all obstructive apnea.  There is clearly hypoxia noted, intermittent bradycardia is seen, and based on all these findings the  positive airway pressure therapy should be continued.   RECOMMENDATION: The patient used in the past 12 cm water as a set pressure.  This time we will place him on an auto titration device by [employer] with a pressure window between 7 and 17 cm water pressure, 2 cm EPR, heated humidification and an interface that does not interfere with facial hair , if possible.    INTERPRETING PHYSICIAN:   Melvyn Novas, MD  [facility] and [facility] Sleep Board certified by [employer] Medicine and Diplomate  of the [employer] of [employer] Medicine. Board certified In Neurology through the ABPN, Fellow of the [employer] Neu[employer].

## 2023-07-09 ENCOUNTER — Telehealth: Payer: Self-pay

## 2023-07-09 ENCOUNTER — Ambulatory Visit (HOSPITAL_COMMUNITY)
Admission: RE | Admit: 2023-07-09 | Discharge: 2023-07-09 | Disposition: A | Discharge status: Home / Self Care | Payer: Medicare HMO | Source: Ambulatory Visit | Attending: Nurse Practitioner | Admitting: Nurse Practitioner

## 2023-07-09 DIAGNOSIS — I351 Nonrheumatic aortic (valve) insufficiency: Secondary | ICD-10-CM | POA: Insufficient documentation

## 2023-07-09 DIAGNOSIS — R0609 Other forms of dyspnea: Secondary | ICD-10-CM | POA: Insufficient documentation

## 2023-07-09 DIAGNOSIS — I480 Paroxysmal atrial fibrillation: Secondary | ICD-10-CM

## 2023-07-09 LAB — ECHOCARDIOGRAM COMPLETE
AR max vel: 2.5 cm2
AV Area VTI: 2.33 cm2
AV Area mean vel: 2.51 cm2
AV Mean grad: 3 mm[Hg]
AV Peak grad: 6.5 mm[Hg]
Ao pk vel: 1.27 m/s
Area-P 1/2: 4.8 cm2
MV M vel: 2.34 m/s
MV Peak grad: 21.9 mm[Hg]
S' Lateral: 3.94 cm

## 2023-07-09 NOTE — Telephone Encounter (Signed)
Spoke with pt. Pt was notified of echocardiogram results. Pt will continue current medication and f/u as planned.   Pt also was notified that I spoke with [employer] pt assistance [employer]. They received the renal application for Eliquis. They just started working on renewals this week and will notify our office of an approval or denial soon.

## 2023-07-11 NOTE — Procedures (Signed)
[name] at [facility] Mcginniss 67 year old male 1956-07-20   HOME [name] TEST REPORT ( by Watch PAT)   STUDY DATE:  07-08-2023= Data from mail-out TEST device    ORDERING CLINICIAN:  REFERRING CLINICIAN: PCP [name] Chafe, [name]    CLINICAL INFORMATION/HISTORY: " My machine displayed a message, saying that it  is going to stop working" .  This Patient presented with severe ankle edema,  crowded teeth, had bypass surgery since last being see for a [name] study.   [name] had been referred as a patient of [name] for a full night polysomnography study in 2018  and was able to [name] for about 319 minutes.  The apnea hypopnea index was 16.6/h which would be considered mild to moderate but it was clustered in supine [name].  Actually every [name] apnea that we measured was seen in supine [name].  He had 6 apneas in non-REM [name], 3 apneas and REM [name] a total of an AHI of 53.3 when in supine and a total of 9.3 apneas in non-REM [name] supine.  So his so his main vulnerability with the REM [name] dependent [name] apnea which has to be treated with positive airway pressure.    He returned for in lab CPAP titration- FFM-  He became a highly compliant user of CPAP and his current download shows 100% compliance with an average of 9-1/2 hours use at night.  CPAP is set still to 12 cm water pressure with 3 cm expiratory relief.  The residual AHI here is 7.3/h and these are all obstructive residuals.  The 95th percentile air leak is very low which means that his interface is fitting well.  95th percentile pressure will not be determined by unknown autotitrator but the air leak was 0.5 L a minute only.   History from 2017, referral by Stroke MD: [name] is seen here today in the presence of his wife, who has noticed him to stop breathing at night and has witnessed snoring. Had a stroke in 2017, is diabetic and severely overweight.     Epworth sleepiness score:  9/ 24 points - sleeps on and  off many hours in the day.   FSS endorsed at 33/ 63 points.    BMI: 37 kg/m   Neck Circumference: 20"    FINDINGS:   [name] Summary by AASM criteria:   Total Recording Time (hours, min): 9 hours 7 minutes      Total [name] Time (hours, min):    7 hours 41 minutes             Percent REM (%):    17%                                    Respiratory Indices:   Calculated pAHI (per hour):    45.3/h and 27.8/h by CMS criteria                         REM pAHI:     40/h                                            NREM pAHI: 47/h  Supine AHI: There were 400 minutes of supine [name] recorded with an AHI by AASM criteria of 45/h (24/h following CMS criteria ).                                                Oxygen Saturation Statistics:   Oxygen Saturation (%) Mean:    92%             O2 Saturation Range (%):    Between the nadir at 81 with a maximum saturation of 98%.                                   O2 Saturation (minutes) <89%:     22 minutes O2 saturation in minutes<90%:     45 minutes      Pulse Rate Statistics:   Pulse Mean (bpm):    44 bpm, clearly bradycardic             Pulse Range:   Between 30 bpm with a maximum of 83 bpm.              IMPRESSION:  This HST confirms the persistent presence of severe [name] apnea which the device here interpreted as all obstructive apnea.  There is clearly hypoxia noted, intermittent bradycardia is seen, and based on all these findings the  positive airway pressure therapy should be continued.   RECOMMENDATION: The patient used in the past 12 cm water as a set pressure.  This time we will place him on an auto titration device by ResMed with a pressure window between 7 and 17 cm water pressure, 2 cm EPR, heated humidification and an interface that does not interfere with facial hair , if possible.    INTERPRETING PHYSICIAN:   [name] Novas, MD  [facility] and [facility] [name] Board certified  by [employer] of [name] Medicine and Diplomate of the [employer] of [name] Medicine. Board certified In Neurology through the ABPN, Fellow of the [employer] of Neu[employer].     Cc Dr Delia Heady, MD

## 2023-07-13 ENCOUNTER — Telehealth: Payer: Self-pay

## 2023-07-13 NOTE — Telephone Encounter (Signed)
-----   Message from Orchid Dohmeier sent at 07/11/2023  7:33 PM EST -----   Confirmed Dx of severe OSA; The patient used in the past 12 cm water as a set pressure.  This time we will place him on an auto titration device by ResMed with a pressure window between 7 and 17 cm water pressure, 2 cm EPR, heated humidification and an interface that does not interfere with facial hair , if possible.

## 2023-07-13 NOTE — Telephone Encounter (Signed)
I left a voicemail for the patient to return our call to review home sleep test results.

## 2023-07-13 NOTE — Telephone Encounter (Signed)
Pt has lvm in sleep lab returning callback. Pt states he is home now. Please call patient back. Thanks

## 2023-07-19 ENCOUNTER — Telehealth: Payer: Self-pay

## 2023-07-19 NOTE — Telephone Encounter (Signed)
Returned pt request for an Phone Call and LVM.

## 2023-07-19 NOTE — Telephone Encounter (Signed)
Called [facility] [place] at [phone] to check the status of pts Eliquis application. Per the representative Lanora Manis, they are currently working on pts application. I was advised to check back in 3-5 days.

## 2023-07-19 NOTE — Telephone Encounter (Signed)
Returned patient call to informed him that we have order a new Auto CPAP machine. His DME will Follow up and get him schedule for setup. Also informed patient to give [place] a call back to schedule his follow 30 to 90 days after setup for his new CPAP machine. Pt verbalized understanding. Pt had no questions at this time but was encouraged to call back if questions arise.

## 2023-07-19 NOTE — Telephone Encounter (Signed)
Pt returned call. Please call back when available. 

## 2023-07-19 NOTE — Telephone Encounter (Signed)
-----   Message from Orchid Dohmeier sent at 07/11/2023  7:33 PM EST -----   Confirmed Dx of severe OSA; The patient used in the past 12 cm water as a set pressure.  This time we will place him on an auto titration device by ResMed with a pressure window between 7 and 17 cm water pressure, 2 cm EPR, heated humidification and an interface that does not interfere with facial hair , if possible.

## 2023-07-20 ENCOUNTER — Telehealth: Payer: Self-pay

## 2023-07-22 ENCOUNTER — Other Ambulatory Visit: Payer: Self-pay

## 2023-07-22 DIAGNOSIS — I739 Peripheral vascular disease, unspecified: Secondary | ICD-10-CM

## 2023-07-28 DIAGNOSIS — E1165 Type 2 diabetes mellitus with hyperglycemia: Secondary | ICD-10-CM | POA: Diagnosis not present
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
## 2023-07-29 ENCOUNTER — Encounter: Payer: Self-pay | Admitting: Vascular Surgery

## 2023-07-29 ENCOUNTER — Ambulatory Visit (INDEPENDENT_AMBULATORY_CARE_PROVIDER_SITE_OTHER): Payer: Medicare HMO | Admitting: Vascular Surgery

## 2023-07-29 ENCOUNTER — Ambulatory Visit (INDEPENDENT_AMBULATORY_CARE_PROVIDER_SITE_OTHER)
Admission: RE | Admit: 2023-07-29 | Discharge: 2023-07-29 | Disposition: A | Discharge status: Home / Self Care | Payer: Medicare HMO | Source: Ambulatory Visit | Attending: Vascular Surgery | Admitting: Vascular Surgery

## 2023-07-29 ENCOUNTER — Ambulatory Visit (HOSPITAL_COMMUNITY)
Admission: RE | Admit: 2023-07-29 | Discharge: 2023-07-29 | Disposition: A | Discharge status: Home / Self Care | Payer: Medicare HMO | Source: Ambulatory Visit | Attending: Vascular Surgery | Admitting: Vascular Surgery

## 2023-07-29 VITALS — BP 181/70 | HR 64 | Temp 98.7°F | Resp 20 | Ht 70.0 in | Wt 279.0 lb

## 2023-07-29 DIAGNOSIS — Z95828 Presence of other vascular implants and grafts: Secondary | ICD-10-CM

## 2023-07-29 DIAGNOSIS — I739 Peripheral vascular disease, unspecified: Secondary | ICD-10-CM

## 2023-07-29 LAB — VAS US ABI WITH/WO TBI
Left ABI: 1.16
Right ABI: 0.7

## 2023-08-02 DIAGNOSIS — G4733 Obstructive sleep apnea (adult) (pediatric): Secondary | ICD-10-CM | POA: Diagnosis not present

## 2023-08-04 ENCOUNTER — Encounter: Payer: Self-pay | Admitting: Urology

## 2023-08-04 ENCOUNTER — Ambulatory Visit: Payer: Medicare HMO | Admitting: Urology

## 2023-08-04 VITALS — BP 192/93 | HR 81 | Ht 70.0 in | Wt 265.0 lb

## 2023-08-04 DIAGNOSIS — R310 Gross hematuria: Secondary | ICD-10-CM | POA: Diagnosis not present

## 2023-08-04 DIAGNOSIS — R399 Unspecified symptoms and signs involving the genitourinary system: Secondary | ICD-10-CM | POA: Diagnosis not present

## 2023-08-04 MED ORDER — FINASTERIDE 5 MG PO TABS: 5.0000 mg | ORAL_TABLET | Freq: Every day | ORAL | 3 refills | Status: AC
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
## 2023-08-05 LAB — URINALYSIS, ROUTINE W REFLEX MICROSCOPIC
Bilirubin, UA: NEGATIVE
Glucose, UA: NEGATIVE
Ketones, UA: NEGATIVE
Leukocytes,UA: NEGATIVE
Nitrite, UA: NEGATIVE
Specific Gravity, UA: 1.025 (ref 1.005–1.030)
Urobilinogen, Ur: 0.2 mg/dL (ref 0.2–1.0)
pH, UA: 5.5 (ref 5.0–7.5)

## 2023-08-05 LAB — MICROSCOPIC EXAMINATION: RBC, Urine: >30 /[HPF] — AB (ref 0–2)

## 2023-08-08 DIAGNOSIS — G4733 Obstructive sleep apnea (adult) (pediatric): Secondary | ICD-10-CM | POA: Diagnosis not present

## 2023-08-10 ENCOUNTER — Other Ambulatory Visit: Payer: Self-pay

## 2023-08-10 DIAGNOSIS — I739 Peripheral vascular disease, unspecified: Secondary | ICD-10-CM

## 2023-08-16 ENCOUNTER — Encounter: Payer: Self-pay | Admitting: Urology

## 2023-08-17 DIAGNOSIS — Z006 Encounter for examination for normal comparison and control in clinical research program: Secondary | ICD-10-CM

## 2023-08-17 MED ORDER — STUDY - ORION 4 - INCLISIRAN 300 MG/1.5 ML OR PLACEBO SQ INJECTION (PI-STUCKEY)
300.0000 mg | INJECTION | SUBCUTANEOUS | Status: AC
  Administered 2023-08-17: 300 mg via SUBCUTANEOUS
  Filled 2023-08-17: qty 1.5

## 2023-08-17 NOTE — Research (Signed)
Orion 4  Patient doing well  Only med change was finasteride was ordered and no more empagliflozin No hospitalizations   Will see patient back 7/29  Current Outpatient Medications:    apixaban (ELIQUIS) 5 MG TABS tablet, Take 1 tablet (5 mg total) by mouth 2 (two) times daily for 14 days., Disp: 28 tablet, Rfl: 0   BD INSULIN SYRINGE U/F 31G X 5/16" 1 ML MISC, AS DIRECTED 3 TIMES A DAY SUBCUTANEOUS 30, Disp: , Rfl:    clopidogrel (PLAVIX) 75 MG tablet, TAKE 1 TABLET BY MOUTH EVERY DAY, Disp: 90 tablet, Rfl: 1   fenofibrate (TRICOR) 145 MG tablet, TAKE 1 TABLET BY MOUTH EVERY DAY, Disp: 30 tablet, Rfl: 11   finasteride (PROSCAR) 5 MG tablet, Take 1 tablet (5 mg total) by mouth daily., Disp: 90 tablet, Rfl: 3   furosemide (LASIX) 40 MG tablet, Take 1 tablet (40 mg total) by mouth 2 (two) times daily., Disp: 90 tablet, Rfl: 1   gabapentin (NEURONTIN) 300 MG capsule, Take 300 mg by mouth 2 (two) times daily. , Disp: , Rfl:    HYDROcodone-acetaminophen (NORCO) 5-325 MG tablet, Take 1 tablet by mouth every 6 (six) hours as needed for moderate pain (pain score 4-6)., Disp: 20 tablet, Rfl: 0   lisinopril (ZESTRIL) 20 MG tablet, Take 20 mg by mouth daily., Disp: , Rfl:    metFORMIN (GLUCOPHAGE) 500 MG tablet, Take 500 mg by mouth 2 (two) times daily., Disp: , Rfl:    metoprolol succinate (TOPROL-XL) 25 MG 24 hr tablet, TAKE 1 TABLET (25 MG TOTAL) BY MOUTH DAILY., Disp: 90 tablet, Rfl: 3   NOVOLIN 70/30 KWIKPEN (70-30) 100 UNIT/ML KwikPen, Inject 45-75 Units into the skin See admin instructions. Inject 65-75 units with breakfast, 45-65 units at lunch and 75 units at dinner, Disp: , Rfl:    ONETOUCH VERIO test strip, AS DIRECTED 3 TIMES A DAY 30, Disp: , Rfl:    potassium chloride SA (KLOR-CON M20) 20 MEQ tablet, TAKE 1 TABLET BY MOUTH TWICE A DAY, Disp: 180 tablet, Rfl: 3   rosuvastatin (CRESTOR) 40 MG tablet, Take 40 mg by mouth daily., Disp: , Rfl:    Semaglutide,0.25 or 0.5MG /DOS, (OZEMPIC, 0.25 OR  0.5 MG/DOSE,) 2 MG/3ML SOPN, Inject 0.25 mg into the skin once a week., Disp: , Rfl:    Study - ORION 4 - inclisiran 300 mg/1.26mL or placebo SQ injection (PI-Stuckey), Inject 1.5 mLs (300 mg total) into the skin every 6 (six) months., Disp: 1 mL, Rfl: 1   VASCEPA 1 g capsule, TAKE 2 CAPSULES BY MOUTH TWICE A DAY, Disp: 160 capsule, Rfl: 6  Current Facility-Administered Medications:    Study - ORION 4 - inclisiran 300 mg/1.68mL or placebo SQ injection (PI-Stuckey), 300 mg, Subcutaneous, Q6 months, , 300 mg at 08/17/23 1155

## 2023-08-18 MED ORDER — APIXABAN 5 MG PO TABS: 5.0000 mg | ORAL_TABLET | Freq: Two times a day (BID) | ORAL | 0 refills | Status: DC

## 2023-08-18 NOTE — Addendum Note (Signed)
Addended by: Lamar Benes on: 08/18/2023 04:51 PM   Modules accepted: Orders

## 2023-08-18 NOTE — Telephone Encounter (Signed)
Spoke with pt about [employer] application for [employer]. I spoke with a representation at [facility] and the application was received. I was told that they will rush the application and pt will hear back this week ([phone]  Pt will pick samples of Eliquis 5 mg this week.

## 2023-08-28 DIAGNOSIS — E1165 Type 2 diabetes mellitus with hyperglycemia: Secondary | ICD-10-CM | POA: Diagnosis not present

## 2023-08-29 ENCOUNTER — Other Ambulatory Visit: Payer: Self-pay | Admitting: Family Medicine

## 2023-08-31 DIAGNOSIS — M47816 Spondylosis without myelopathy or radiculopathy, lumbar region: Secondary | ICD-10-CM | POA: Diagnosis not present

## 2023-08-31 DIAGNOSIS — M51369 Other intervertebral disc degeneration, lumbar region without mention of lumbar back pain or lower extremity pain: Secondary | ICD-10-CM | POA: Diagnosis not present

## 2023-09-02 DIAGNOSIS — G4733 Obstructive sleep apnea (adult) (pediatric): Secondary | ICD-10-CM | POA: Diagnosis not present

## 2023-09-06 ENCOUNTER — Telehealth: Payer: Self-pay | Admitting: Internal Medicine

## 2023-09-06 NOTE — Telephone Encounter (Signed)
Yes, I can. I will reach of to [facility] and discuss further with Bernadene Person, NP when she returns Wednesday September 08, 2023.

## 2023-09-06 NOTE — Telephone Encounter (Signed)
 Noted

## 2023-09-06 NOTE — Telephone Encounter (Signed)
Spoke with Cala Bradford from [place] [phone]. Per Alver Fisher & state protocol, It has to have the MD's signature. They're aware that pt was seen by Bernadene Person NP. They're also aware that this application was turned in in December 2024 & that I've followed up on this application a few times. Page 4 will be filled out and will await Dr. Blanchie Dessert signature next week.

## 2023-09-06 NOTE — Telephone Encounter (Signed)
Caller Chelsea Primus) stated patient's application will need to be corrected.  Caller stated Dr. Rennis Golden will need to sign patient's application as E. Monge signed in the wrong space.  Dennie Bible ID# - [id number].

## 2023-09-06 NOTE — Telephone Encounter (Signed)
Routed to A. Christell Constant CMA

## 2023-09-14 DIAGNOSIS — M47816 Spondylosis without myelopathy or radiculopathy, lumbar region: Secondary | ICD-10-CM | POA: Diagnosis not present

## 2023-09-17 NOTE — Telephone Encounter (Signed)
 Will look further into LIS/extra help program and discuss further with Rutherford Nail.

## 2023-09-17 NOTE — Telephone Encounter (Signed)
 Thank you Rutherford Nail. I'll check pts income and discuss further with him when I return to [place] office on Tuesday.

## 2023-09-17 NOTE — Telephone Encounter (Signed)
 Called to check the on the status of application. Spoke with Apolinar Junes [phone]. Missing information was faxed Monday to [facility]. Application has been denied due to pt not meeting the cost deductible for the calendar year. I was also told that Medicare has changed some of the qualifications starting in January. Alver Fisher sent pt a denial letter. Our office hasn't received one and I was told they will ask if a letter can be sent to our office.

## 2023-09-20 ENCOUNTER — Telehealth: Payer: Self-pay | Admitting: Cardiovascular Disease

## 2023-09-20 NOTE — Telephone Encounter (Signed)
 Spoke to patient he stated Vascepa is too expensive.Advised Dr.Berry is out of office today.I will send message to him to ask if ok to take over the counter fish oil.

## 2023-09-20 NOTE — Telephone Encounter (Signed)
 Pt c/o medication issue:  1. Name of Medication: VASCEPA 1 g capsule   2. How are you currently taking this medication (dosage and times per day)? TAKE 2 CAPSULES BY MOUTH TWICE A DAY   3. Are you having a reaction (difficulty breathing--STAT)? No  4. What is your medication issue? Patient is calling because he is unable to afford the medication. Patient would like to know if there would be another alternative to getting this medication at a cheaper cost. Please advise.

## 2023-09-20 NOTE — Telephone Encounter (Signed)
 Message sent to prior auth team to see about enrollment for [employer]

## 2023-09-21 ENCOUNTER — Encounter: Payer: Self-pay | Admitting: Pharmacy Technician

## 2023-09-21 ENCOUNTER — Other Ambulatory Visit (HOSPITAL_COMMUNITY): Payer: Self-pay

## 2023-09-21 ENCOUNTER — Telehealth: Payer: Self-pay | Admitting: Pharmacy Technician

## 2023-09-21 NOTE — Telephone Encounter (Signed)
 Grant approved. Pharmacy made aware. Patient made aware via mychart

## 2023-09-21 NOTE — Telephone Encounter (Signed)
 Patient Advocate Encounter   The patient was approved for a [employer] grant that will help cover the cost of vascepa Total amount awarded, 2500.00.  Effective: 08/22/23 - 08/20/24   [id number] [id number]   Pharmacy provided with approval and processing information. Patient informed via mychart

## 2023-09-23 ENCOUNTER — Other Ambulatory Visit: Payer: Self-pay | Admitting: Family Medicine

## 2023-09-25 DIAGNOSIS — E1165 Type 2 diabetes mellitus with hyperglycemia: Secondary | ICD-10-CM | POA: Diagnosis not present

## 2023-09-27 NOTE — Telephone Encounter (Signed)
 Lmom to discuss medication application denial and options.

## 2023-09-28 DIAGNOSIS — G4733 Obstructive sleep apnea (adult) (pediatric): Secondary | ICD-10-CM | POA: Diagnosis not present

## 2023-09-29 DIAGNOSIS — E1165 Type 2 diabetes mellitus with hyperglycemia: Secondary | ICD-10-CM | POA: Diagnosis not present

## 2023-09-30 ENCOUNTER — Telehealth: Payer: Self-pay | Admitting: Cardiovascular Disease

## 2023-09-30 DIAGNOSIS — G4733 Obstructive sleep apnea (adult) (pediatric): Secondary | ICD-10-CM | POA: Diagnosis not present

## 2023-09-30 NOTE — Telephone Encounter (Signed)
 Pt states that he is returning a call that he received 2 days ago. Please advise

## 2023-09-30 NOTE — Telephone Encounter (Signed)
 Spoke with pt. Pt isn't able to talk right now. Will call pt back.

## 2023-10-04 NOTE — Telephone Encounter (Signed)
 Patient is returning phone call.

## 2023-10-04 NOTE — Telephone Encounter (Signed)
 Spoke with pt. Pt is aware that Eliquis 5 mg pt assistance application was denied. Pt was given the information to call [facility] [phone]) to see available resources given for Medicaid. Pt isn't able to pay the monthly cost for Coumadin. Pt is going to give an update about the resources given at the [facility]. 3 boxes of Eliquis samples were placed up front for pick Monday October 11, 2023 when pt see's Dr. [name].

## 2023-10-05 DIAGNOSIS — M47816 Spondylosis without myelopathy or radiculopathy, lumbar region: Secondary | ICD-10-CM | POA: Diagnosis not present

## 2023-10-06 DIAGNOSIS — I10 Essential (primary) hypertension: Secondary | ICD-10-CM | POA: Diagnosis not present

## 2023-10-06 DIAGNOSIS — E7800 Pure hypercholesterolemia, unspecified: Secondary | ICD-10-CM | POA: Diagnosis not present

## 2023-10-06 DIAGNOSIS — E1165 Type 2 diabetes mellitus with hyperglycemia: Secondary | ICD-10-CM | POA: Diagnosis not present

## 2023-10-06 DIAGNOSIS — N1831 Chronic kidney disease, stage 3a: Secondary | ICD-10-CM | POA: Diagnosis not present

## 2023-10-06 DIAGNOSIS — I2510 Atherosclerotic heart disease of native coronary artery without angina pectoris: Secondary | ICD-10-CM | POA: Diagnosis not present

## 2023-10-06 DIAGNOSIS — E1140 Type 2 diabetes mellitus with diabetic neuropathy, unspecified: Secondary | ICD-10-CM | POA: Diagnosis not present

## 2023-10-11 ENCOUNTER — Ambulatory Visit: Payer: Medicare HMO | Attending: Cardiovascular Disease | Admitting: Cardiovascular Disease

## 2023-10-11 ENCOUNTER — Encounter: Payer: Self-pay | Admitting: Cardiovascular Disease

## 2023-10-11 VITALS — BP 140/70 | HR 62 | Ht 70.0 in | Wt 273.6 lb

## 2023-10-11 DIAGNOSIS — I480 Paroxysmal atrial fibrillation: Secondary | ICD-10-CM | POA: Diagnosis not present

## 2023-10-11 DIAGNOSIS — E782 Mixed hyperlipidemia: Secondary | ICD-10-CM | POA: Diagnosis not present

## 2023-10-11 DIAGNOSIS — E785 Hyperlipidemia, unspecified: Secondary | ICD-10-CM | POA: Diagnosis not present

## 2023-10-11 DIAGNOSIS — I739 Peripheral vascular disease, unspecified: Secondary | ICD-10-CM | POA: Diagnosis not present

## 2023-10-11 NOTE — Assessment & Plan Note (Signed)
 History of hyperlipidemia on high-dose rosuvastatin, Vascepa and inclisiran research study (Orian 4)..  His last lipid profile in our chart performed 08/10/2020 revealed total cholesterol 150, LDL 83 and HDL of 32.

## 2023-10-11 NOTE — Assessment & Plan Note (Signed)
 History of CAD status post coronary artery bypass grafting x 4 in 2002 after myocardial infarction.  He has really been asymptomatic from a coronary point of view since.

## 2023-10-11 NOTE — Assessment & Plan Note (Signed)
 History of PAD status post peripheral angiography which I performed 03/28/2015 revealing an occluded left SFA with a patent left Pham posterior tibial bypass graft.  He had 80% stenosis in the P1 segment of the right lower extremity with two-vessel runoff (occluded peroneal).  I performed [facility] 1 directional atherectomy followed by [facility] with an excellent angiographic result.  His claudication resolved.  He did have an occluded left femoropopliteal bypass graft by angiography which has been redone by Dr. Darrick Penna.  They follow him noninvasively.  He denies claudication.

## 2023-10-11 NOTE — Patient Instructions (Signed)
 Medication Instructions:  Your physician recommends that you continue on your current medications as directed. Please refer to the Current Medication list given to you today.  *If you need a refill on your cardiac medications before your next appointment, please call your pharmacy*    Follow-Up: At [facility], you and your health needs are our priority.  As part of our continuing mission to provide you with exceptional heart care, we have created designated Provider Care Teams.  These Care Teams include your primary Cardiologist (physician) and Advanced Practice Providers (APPs -  Physician Assistants and Nurse Practitioners) who all work together to provide you with the care you need, when you need it.  We recommend signing up for the patient portal called "MyChart".  Sign up information is provided on this After Visit Summary.  MyChart is used to connect with patients for Virtual Visits (Telemedicine).  Patients are able to view lab/test results, encounter notes, upcoming appointments, etc.  Non-urgent messages can be sent to your provider as well.   To learn more about what you can do with MyChart, go to [email or web address].    Your next appointment:   12 month(s)  Provider:   Bernadene Person, NP       Then, Nanetta Batty, MD will plan to see you again in 2 year(s).    Other Instructions   1st Floor: - Lobby - Registration  - Pharmacy  - Lab - Cafe  2nd Floor: - PV Lab - Diagnostic Testing (echo, CT, nuclear med)  3rd Floor: - Vacant  4th Floor: - TCTS (cardiothoracic surgery) - AFib Clinic - Structural Heart Clinic - Vascular Surgery  - Vascular Ultrasound  5th Floor: - HeartCare Cardiology (general and EP) - Clinical Pharmacy for coumadin, hypertension, lipid, weight-loss medications, and med management appointments    Valet parking services will be available as well.

## 2023-10-11 NOTE — Progress Notes (Signed)
 10/11/2023 RONTRELL MOQUIN   1955-09-12  [id number]  Primary Physician Carmelia Roller, Jilda Roche, DO Primary Cardiologist: Runell Gess MD Nicholes Calamity, [place]  HPI:  [name] is a 68 y.o.    moderately overweight, widowed Caucasian male, father of 2, grandfather to 3 grandchildren who has a history of ischemic heart disease status post MI back in 2002 with bypass grafting x4 at that time. I last saw him in the office 01/04/2019.  He is accompanied by his significant other Chip Boer today.[name]  He has seen Azalee Course PA-C as well as several other APP's since that time.[name] KitchenHe has had occluded graft by cath and has had EECP in the past. His other problems include remote tobacco abuse, diabetes with peripheral neuropathy, dyslipidemia and hypertension. He also has peripheral vascular occlusive disease. I stented his left lower extremity several times, the most recent with a Viabahn endoprosthesis with an excellent result. This ultimated closed probably because of his failure to continue his Plavix. He ultimately underwent above-to-below-the-knee bypass grafting by Dr. Fabienne Bruns which eventually closed as well. Dr. Darrick Penna recently did Dopplers on him revealing a right ABI of 1.03 and a left of 0.66. His last Myoview performed Dec 12, 2011, revealed inferolateral thinning towards the apex. He is otherwise asymptomatic.his lipids are followed by Dr. Talmage Nap, his endocrinologist. He did have a negative Myoview in August of last year prior to orthopedic surgery. He denies chest pain but does complain of increasing dyspnea on exertion. Recent lower extremity Dopplers performed by Dr. Darrick Penna revealed a right ABI of 0.76 and a left of 0.61. He does complain of bilateral lower extremity lifestyle limiting claudication. Recent Dopplers performed 02/05/15 revealed a right ABI 0.79 with a high frequency signal in the right popliteal artery and one-vessel runoff, left ABI of 0.66 with an occluded popliteal artery. A 2-D echo  and Myoview stress test were unrevealing. I performed angiography on him 03/28/15 revealing an occluded left SFA with a patent left phlegm to posterior tibial bypass. He had an 80% stenosis in the P1 segment of the right lower extremity with two-vessel runoff (occluded peroneal). I performed [facility]  one directional atherectomy followed by drug eluding balloon angioplasty  with excellent angiographic and clinical result. His Dopplers improved and his claudication resolved. He saw Dr. Darrick Penna after my last office visit at that time was noted that his left femoropopliteal bypass graft had occluded by angiography. He underwent redo composite vein/PTFE left fem-tib bypass grafting. He had a stroke back in March and a subsequent stroke last month. He was noted to be in paroxysmal A. fib and was started on Eliquis. He also underwent basilar artery stenting by Dr. Titus Dubin. He is supposed to start drug rehabilitation. He also complains of increasing dyspnea on exertion since his stroke. A 2-D echo reveals slight decline in his EF down to the 45-50% range. Since I saw him in the office a year ago he's done well. He denies chest pain or shortness of breath. He is followed as an outpatient additionally by Dr. Cleon Gustin for his diabetes and hyperlipidemia as well as Dr. Darrick Penna and Dr. Titus Dubin.   Since I saw him 5 years ago he has seen Anice Paganini, [place] 06/11/2023.  He does have chronic diastolic heart failure and high-dose furosemide.  He has had a wound on his right foot and has undergone outpatient wound care for 10 months at [facility] wound care center which has healed.  He also suffered a  stroke in the past and underwent basilar artery stenting by Dr. Titus Dubin.  The vascular surgeons follow his lower extremities.  He denies chest pain or shortness of breath.   Current Meds  Medication Sig   apixaban (ELIQUIS) 5 MG TABS tablet Take 1 tablet (5 mg total) by mouth 2 (two) times daily.   BD INSULIN SYRINGE U/F 31G X 5/16"  1 ML MISC AS DIRECTED 3 TIMES A DAY SUBCUTANEOUS 30   clopidogrel (PLAVIX) 75 MG tablet TAKE 1 TABLET BY MOUTH EVERY DAY   Continuous Glucose Receiver (DEXCOM G7 RECEIVER) DEVI by Does not apply route every 14 (fourteen) days.   Continuous Glucose Sensor (DEXCOM G7 SENSOR) MISC by Does not apply route every 14 (fourteen) days.   fenofibrate (TRICOR) 145 MG tablet TAKE 1 TABLET BY MOUTH EVERY DAY   finasteride (PROSCAR) 5 MG tablet Take 1 tablet (5 mg total) by mouth daily.   furosemide (LASIX) 40 MG tablet Take 1 tablet (40 mg total) by mouth 2 (two) times daily.   gabapentin (NEURONTIN) 300 MG capsule Take 300 mg by mouth 2 (two) times daily.    HYDROcodone-acetaminophen (NORCO) 5-325 MG tablet Take 1 tablet by mouth every 6 (six) hours as needed for moderate pain (pain score 4-6).   lisinopril (ZESTRIL) 20 MG tablet Take 20 mg by mouth daily.   metFORMIN (GLUCOPHAGE) 500 MG tablet Take 500 mg by mouth 2 (two) times daily.   metoprolol succinate (TOPROL-XL) 25 MG 24 hr tablet TAKE 1 TABLET (25 MG TOTAL) BY MOUTH DAILY.   NOVOLIN 70/30 KWIKPEN (70-30) 100 UNIT/ML KwikPen Inject 45-75 Units into the skin See admin instructions. Inject 65-75 units with breakfast, 45-65 units at lunch and 75 units at dinner   ONETOUCH VERIO test strip AS DIRECTED 3 TIMES A DAY 30   potassium chloride SA (KLOR-CON M20) 20 MEQ tablet TAKE 1 TABLET BY MOUTH TWICE A DAY   rosuvastatin (CRESTOR) 40 MG tablet Take 40 mg by mouth daily.   Semaglutide,0.25 or 0.5MG /DOS, (OZEMPIC, 0.25 OR 0.5 MG/DOSE,) 2 MG/3ML SOPN Inject 0.25 mg into the skin once a week.   VASCEPA 1 g capsule TAKE 2 CAPSULES BY MOUTH TWICE A DAY   Current Facility-Administered Medications for the 10/11/23 encounter (Office Visit) with Runell Gess, MD  Medication   Study - ORION 4 - inclisiran 300 mg/1.63mL or placebo SQ injection (PI-Stuckey)     No Known Allergies  Social History   Socioeconomic History   Marital status: Widowed    Spouse  name: Not on file   Number of children: 2   Years of education: Not on file   Highest education level: Not on file  Occupational History   Occupation: disable  Tobacco Use   Smoking status: Never   Smokeless tobacco: Never  Vaping Use   Vaping status: Never Used  Substance and Sexual Activity   Alcohol use: No   Drug use: No   Sexual activity: Yes  Other Topics Concern   Not on file  Social History Narrative   Not on file   Social Drivers of Health   Financial Resource Strain: Medium Risk (03/10/2023)   Overall Financial Resource Strain (CARDIA)    Difficulty of Paying Living Expenses: Somewhat hard  Food Insecurity: No Food Insecurity (03/10/2023)   Hunger Vital Sign    Worried About Running Out of Food in the Last Year: Never true    Ran Out of Food in the Last Year: Never true  Transportation  Needs: No Transportation Needs (03/10/2023)   PRAPARE - Administrator, Civil Service (Medical): No    Lack of Transportation (Non-Medical): No  Physical Activity: Inactive (03/10/2023)   Exercise Vital Sign    Days of Exercise per Week: 0 days    Minutes of Exercise per Session: 0 min  Stress: No Stress Concern Present (03/10/2023)   [employer] of Occupational Health - Occupational Stress Questionnaire    Feeling of Stress : Not at all  Social Connections: Unknown (12/14/2022)   Received from [facility]   Social Network    Social Network: Not on file  Intimate Partner Violence: Not At Risk (03/10/2023)   Humiliation, Afraid, Rape, and Kick questionnaire    Fear of Current or Ex-Partner: No    Emotionally Abused: No    Physically Abused: No    Sexually Abused: No     Review of Systems: General: negative for chills, fever, night sweats or weight changes.  Cardiovascular: negative for chest pain, dyspnea on exertion, edema, orthopnea, palpitations, paroxysmal nocturnal dyspnea or shortness of breath Dermatological: negative for  rash Respiratory: negative for cough or wheezing Urologic: negative for hematuria Abdominal: negative for nausea, vomiting, diarrhea, bright red blood per rectum, melena, or hematemesis Neurologic: negative for visual changes, syncope, or dizziness All other systems reviewed and are otherwise negative except as noted above.    Blood pressure (!) 140/70, pulse 62, height 5\' 10"  (1.778 m), weight 273 lb 9.6 oz (124.1 kg), SpO2 96%.  General appearance: alert and no distress Neck: no adenopathy, no carotid bruit, no JVD, supple, symmetrical, trachea midline, and thyroid not enlarged, symmetric, no tenderness/mass/nodules Lungs: clear to auscultation bilaterally Heart: irregularly irregular rhythm Extremities: Both lower extremities have compression stockings secondary to diastolic dysfunction. Pulses: The pedal pulses Skin: Skin color, texture, turgor normal. No rashes or lesions Neurologic: Grossly normal  EKG not performed today      ASSESSMENT AND PLAN:   Coronary artery disease, history of CABG History of CAD status post coronary artery bypass grafting x 4 in 2002 after myocardial infarction.  He has really been asymptomatic from a coronary point of view since.  PAD (peripheral artery disease) (HCC) History of PAD status post peripheral angiography which I performed 03/28/2015 revealing an occluded left SFA with a patent left Pham posterior tibial bypass graft.  He had 80% stenosis in the P1 segment of the right lower extremity with two-vessel runoff (occluded peroneal).  I performed [facility] 1 directional atherectomy followed by [facility] with an excellent angiographic result.  His claudication resolved.  He did have an occluded left femoropopliteal bypass graft by angiography which has been redone by Dr. Darrick Penna.  They follow him noninvasively.  He denies claudication.  Hyperlipidemia History of hyperlipidemia on high-dose rosuvastatin, Vascepa and inclisiran research study (Orian 4)..  His last  lipid profile in our chart performed 08/10/2020 revealed total cholesterol 150, LDL 83 and HDL of 32.  Paroxysmal atrial fibrillation (HCC) History of persistent A-fib rate controlled on Eliquis oral anticoagulation.     Runell Gess MD FACP,FACC,FAHA, [facility] 10/11/2023 10:20 AM

## 2023-10-11 NOTE — Assessment & Plan Note (Signed)
History of persistent A-fib rate controlled on Eliquis oral anticoagulation. 

## 2023-10-22 ENCOUNTER — Other Ambulatory Visit: Payer: Self-pay | Admitting: Family Medicine

## 2023-10-26 DIAGNOSIS — E1165 Type 2 diabetes mellitus with hyperglycemia: Secondary | ICD-10-CM | POA: Diagnosis not present

## 2023-10-31 DIAGNOSIS — G4733 Obstructive sleep apnea (adult) (pediatric): Secondary | ICD-10-CM | POA: Diagnosis not present

## 2023-11-01 ENCOUNTER — Telehealth: Payer: Self-pay

## 2023-11-01 NOTE — Telephone Encounter (Signed)
   Pre-operative Risk Assessment    Patient Name: [name]  DOB: 11-29-1955 MRN: [id number]   Date of last office visit: 10/11/23 with DR. BERRY Date of next office visit: NONE   Request for Surgical Clearance    Procedure:   LUMBAR RADIOFREQUENCY LESIONING   Date of Surgery:  Clearance 11/19/23                                Surgeon:  DR. Rexanne Catalina Surgeon's Group or Practice Name:  [facility] Phone number:  [phone] Fax number:  [phone]   Type of Clearance Requested:   - Pharmacy:  Hold Clopidogrel (Plavix) and Apixaban (Eliquis) X 3 DAYS PRIOR   Type of Anesthesia:  Not Indicated   Additional requests/questions:    Signed, Vira Grieves   11/01/2023, 4:00 PM

## 2023-11-01 NOTE — Telephone Encounter (Signed)
 Patient with diagnosis of afib on Eliquis for anticoagulation.    Procedure: LUMBAR RADIOFREQUENCY LESIONING  Date of procedure: 11/19/23   CHA2DS2-VASc Score = 7   This indicates a 11.2% annual risk of stroke. The patient's score is based upon: CHF History: 1 HTN History: 1 Diabetes History: 1 Stroke History: 2 Vascular Disease History: 1 Age Score: 1 Gender Score: 0      CrCl 69 ml/min Platelet count 241  Due to pt hx of multiple strokes and the need to hold Eliquis for 3 days, will defer to Dr. Katheryne Pane  **This guidance is not considered finalized until pre-operative APP has relayed final recommendations.**

## 2023-11-02 ENCOUNTER — Other Ambulatory Visit: Payer: Self-pay

## 2023-11-02 MED ORDER — FUROSEMIDE 40 MG PO TABS: 40.0000 mg | ORAL_TABLET | Freq: Two times a day (BID) | ORAL | 3 refills | Status: AC

## 2023-11-03 NOTE — Telephone Encounter (Signed)
 Patient has been scheduled 11/23 in PharmD clinic to review lovenox bridge

## 2023-11-03 NOTE — Telephone Encounter (Signed)
 Per Dr. Addie Holstein, Patient may hold Eliquis for 3 days prior to procedure but will need bridge with Lovenox.  Called pt and LVM for him to call back. Will discuss bridge with patient and schedule in office to review.

## 2023-11-03 NOTE — Telephone Encounter (Signed)
   Patient Name: [name]  DOB: [birth year] MRN: [id number]  Primary Cardiologist: Lauro Portal, MD  Clinical pharmacists have reviewed the patient's past medical history, labs, and current medications as part of preoperative protocol coverage. The following recommendations have been made:  Hold Plavix 5 days prior to procedure and can hold Eliquis 3 days prior to procedure with Lovenox bridge.  Patient will be contacted by our office for scheduled follow-up to review Lovenox instructions prior to procedure.   I will route this recommendation to the requesting party via Epic fax function and remove from pre-op pool.  Please call with questions.  Francene Ing, Retha Cast, NP 11/03/2023, 11:12 AM

## 2023-11-03 NOTE — Telephone Encounter (Signed)
 I agree with Lovett Ruck that with him having recent stent and diagnosis of A-fib that if Eliquis needs to be held, he should be bridged probably with Lovenox.  Plavix can be held 5 days preop  Randene Bustard, MD

## 2023-11-10 ENCOUNTER — Ambulatory Visit: Attending: Internal Medicine | Admitting: Pharmacist

## 2023-11-10 DIAGNOSIS — I639 Cerebral infarction, unspecified: Secondary | ICD-10-CM | POA: Diagnosis not present

## 2023-11-10 MED ORDER — ENOXAPARIN SODIUM 120 MG/0.8ML IJ SOSY: 120.0000 mg | PREFILLED_SYRINGE | Freq: Two times a day (BID) | INTRAMUSCULAR | 0 refills | Status: DC

## 2023-11-10 NOTE — Patient Instructions (Signed)
 4/28: Last dose of Eliquis  in the PM  4/29: No Eliquis , Inject 120mg  into the abdomen at least 2 in away from belly button at 7 AM and 7PM  4/30:Inject 120mg  into the abdomen at least 2 in away from belly button at 7 AM and 7PM  5/1:Inject 120mg  into the abdomen at least 2 in away from belly button at 7 AM No PM dose  5/2: procedure day: No Eliquis , No enoxaparin   Resume Eliquis  as directed by MD  Last dose of Plavix  is 4/26 Do not take the Plavix  starting 4/27

## 2023-11-10 NOTE — Progress Notes (Signed)
 Patient to undergo spinal procedure May 2.  Due to his history of several strokes was recommended by Dr. Addie Holstein, who is covering for Dr. Katheryne Pane, that patient be bridged with Lovenox . Patient presents today accompanied by his caregiver to review instructions for bridge.   4/28: Last dose of Eliquis  in the PM  4/29: No Eliquis , Inject 120mg  into the abdomen at least 2 in away from belly button at 7 AM and 7PM  4/30:Inject 120mg  into the abdomen at least 2 in away from belly button at 7 AM and 7PM  5/1:Inject 120mg  into the abdomen at least 2 in away from belly button at 7 AM No PM dose  5/2: procedure day: No Eliquis , No enoxaparin   Resume Eliquis  as directed by MD  Last dose of Plavix  is 4/26 Do not take the Plavix  starting 4/27  Reviewed injection technique, patient familiar as he gets insulin  injections.  [facility] and I reiterated to patient that he will need 5 syringes.

## 2023-11-19 DIAGNOSIS — M47896 Other spondylosis, lumbar region: Secondary | ICD-10-CM | POA: Diagnosis not present

## 2023-11-19 DIAGNOSIS — M47816 Spondylosis without myelopathy or radiculopathy, lumbar region: Secondary | ICD-10-CM | POA: Diagnosis not present

## 2023-11-25 DIAGNOSIS — E1165 Type 2 diabetes mellitus with hyperglycemia: Secondary | ICD-10-CM | POA: Diagnosis not present

## 2023-11-30 DIAGNOSIS — G4733 Obstructive sleep apnea (adult) (pediatric): Secondary | ICD-10-CM | POA: Diagnosis not present

## 2023-12-01 DIAGNOSIS — M51369 Other intervertebral disc degeneration, lumbar region without mention of lumbar back pain or lower extremity pain: Secondary | ICD-10-CM | POA: Diagnosis not present

## 2023-12-01 DIAGNOSIS — M47896 Other spondylosis, lumbar region: Secondary | ICD-10-CM | POA: Diagnosis not present

## 2023-12-08 ENCOUNTER — Other Ambulatory Visit: Payer: Self-pay | Admitting: Family Medicine

## 2023-12-09 DIAGNOSIS — Z008 Encounter for other general examination: Secondary | ICD-10-CM | POA: Diagnosis not present

## 2023-12-09 DIAGNOSIS — I130 Hypertensive heart and chronic kidney disease with heart failure and stage 1 through stage 4 chronic kidney disease, or unspecified chronic kidney disease: Secondary | ICD-10-CM | POA: Diagnosis not present

## 2023-12-09 DIAGNOSIS — Z794 Long term (current) use of insulin: Secondary | ICD-10-CM | POA: Diagnosis not present

## 2023-12-09 DIAGNOSIS — E785 Hyperlipidemia, unspecified: Secondary | ICD-10-CM | POA: Diagnosis not present

## 2023-12-09 DIAGNOSIS — I509 Heart failure, unspecified: Secondary | ICD-10-CM | POA: Diagnosis not present

## 2023-12-09 DIAGNOSIS — F324 Major depressive disorder, single episode, in partial remission: Secondary | ICD-10-CM | POA: Diagnosis not present

## 2023-12-09 DIAGNOSIS — E1122 Type 2 diabetes mellitus with diabetic chronic kidney disease: Secondary | ICD-10-CM | POA: Diagnosis not present

## 2023-12-09 DIAGNOSIS — D6869 Other thrombophilia: Secondary | ICD-10-CM | POA: Diagnosis not present

## 2023-12-09 DIAGNOSIS — N1831 Chronic kidney disease, stage 3a: Secondary | ICD-10-CM | POA: Diagnosis not present

## 2023-12-09 DIAGNOSIS — I4891 Unspecified atrial fibrillation: Secondary | ICD-10-CM | POA: Diagnosis not present

## 2023-12-09 DIAGNOSIS — E1142 Type 2 diabetes mellitus with diabetic polyneuropathy: Secondary | ICD-10-CM | POA: Diagnosis not present

## 2023-12-09 DIAGNOSIS — E1151 Type 2 diabetes mellitus with diabetic peripheral angiopathy without gangrene: Secondary | ICD-10-CM | POA: Diagnosis not present

## 2023-12-12 ENCOUNTER — Other Ambulatory Visit: Payer: Self-pay | Admitting: Internal Medicine

## 2023-12-23 MED ORDER — APIXABAN 5 MG PO TABS: 5.0000 mg | ORAL_TABLET | Freq: Two times a day (BID) | ORAL | 0 refills | Status: DC

## 2023-12-23 NOTE — Addendum Note (Signed)
 Addended by: Karyn Pai on: 12/23/2023 09:44 AM   Modules accepted: Orders

## 2023-12-26 DIAGNOSIS — E1165 Type 2 diabetes mellitus with hyperglycemia: Secondary | ICD-10-CM | POA: Diagnosis not present

## 2023-12-27 ENCOUNTER — Telehealth: Payer: Self-pay | Admitting: Cardiovascular Disease

## 2023-12-27 NOTE — Telephone Encounter (Signed)
 Pt c/o medication issue:  1. Name of Medication:   apixaban  (ELIQUIS ) 5 MG TABS tablet   2. How are you currently taking this medication (dosage and times per day)?   As prescribed  3. Are you having a reaction (difficulty breathing--STAT)?   4. What is your medication issue?   Patient stated he can no longer afford this medication and wants assistance getting this medication.  Patient stated he only has enough medication to last until Sunday (6/15).

## 2023-12-30 ENCOUNTER — Telehealth: Payer: Self-pay

## 2023-12-30 DIAGNOSIS — G4733 Obstructive sleep apnea (adult) (pediatric): Secondary | ICD-10-CM | POA: Diagnosis not present

## 2023-12-30 NOTE — Telephone Encounter (Signed)
 Mailing PAP application to patient

## 2023-12-30 NOTE — Telephone Encounter (Addendum)
 NEEDS APP MAILED Staff onsite again on 6/13

## 2023-12-31 ENCOUNTER — Telehealth: Payer: Self-pay | Admitting: Physician Assistant

## 2023-12-31 DIAGNOSIS — I480 Paroxysmal atrial fibrillation: Secondary | ICD-10-CM

## 2023-12-31 DIAGNOSIS — G4733 Obstructive sleep apnea (adult) (pediatric): Secondary | ICD-10-CM | POA: Diagnosis not present

## 2023-12-31 MED ORDER — APIXABAN 5 MG PO TABS: 5.0000 mg | ORAL_TABLET | Freq: Two times a day (BID) | ORAL | Status: DC

## 2023-12-31 MED ORDER — APIXABAN 5 MG PO TABS
5.0000 mg | ORAL_TABLET | Freq: Two times a day (BID) | ORAL | 11 refills | Status: AC
Start: 2023-12-31 — End: ?

## 2023-12-31 NOTE — Telephone Encounter (Signed)
 PAP: Patient assistance application for Eliquis through [employer] (BMS) has been mailed to pt's home address on file. Provider portion of application will be faxed to provider's office.

## 2023-12-31 NOTE — Telephone Encounter (Signed)
 Pt called stating he is out of eliquis  and requests a [facility]. I have sent that over. He is expecting an application for patient assistance.   Lamond Pilot, PA-C 12/31/2023, 8:02 PM [phone] [facility] [address] [place], [place] [address]

## 2024-01-03 ENCOUNTER — Telehealth: Payer: Self-pay | Admitting: Family Medicine

## 2024-01-03 NOTE — Telephone Encounter (Signed)
 Copied from CRM [id number]. Topic: Medicare AWV >> Jan 03, 2024  2:44 PM Juliana Ocean wrote: Reason for CRM: LVM 01/03/2024 to schedule AWV. Please schedule Virtual or Telehealth visits ONLY.   Rosalee Collins; Care Guide Ambulatory Clinical Support [place] [facility] Direct Dial: [phone]

## 2024-01-06 DIAGNOSIS — E113293 Type 2 diabetes mellitus with mild nonproliferative diabetic retinopathy without macular edema, bilateral: Secondary | ICD-10-CM | POA: Diagnosis not present

## 2024-01-06 DIAGNOSIS — H25813 Combined forms of age-related cataract, bilateral: Secondary | ICD-10-CM | POA: Diagnosis not present

## 2024-01-12 ENCOUNTER — Other Ambulatory Visit: Payer: Self-pay | Admitting: Cardiovascular Disease

## 2024-01-13 DIAGNOSIS — H25813 Combined forms of age-related cataract, bilateral: Secondary | ICD-10-CM | POA: Diagnosis not present

## 2024-01-15 ENCOUNTER — Encounter (HOSPITAL_COMMUNITY): Payer: Self-pay | Admitting: Interventional Radiology

## 2024-01-16 ENCOUNTER — Other Ambulatory Visit: Payer: Self-pay | Admitting: Family Medicine

## 2024-01-16 ENCOUNTER — Other Ambulatory Visit: Payer: Self-pay | Admitting: Cardiovascular Disease

## 2024-01-17 NOTE — Telephone Encounter (Signed)
 Application scanned in media. I faxed what is scanned to bms. It only had the patient form, oop and proof of income.

## 2024-01-18 HISTORY — PX: CATARACT EXTRACTION: SUR2

## 2024-01-18 NOTE — Telephone Encounter (Signed)
 Application signed by Dr Court and faxed to number provided.

## 2024-01-18 NOTE — Telephone Encounter (Signed)
 Please have provider sign and date provider portion (see chart media). Please fax the completed form back to [phone]. Thanks in advance for your time!

## 2024-01-19 ENCOUNTER — Telehealth: Payer: Self-pay | Admitting: *Deleted

## 2024-01-19 NOTE — Telephone Encounter (Signed)
 Called pt to review denial of pt assistance. Pt reports will call back is currently in a loud environment and can not hear.

## 2024-01-19 NOTE — Telephone Encounter (Signed)
 Scanned in media.

## 2024-01-19 NOTE — Telephone Encounter (Signed)
   Confirmed we can pull data from resmed [employer]. Pt also needs appt ASAP to ensure insurance will continue to cover. I called pt and scheduled appt for 01/20/24 at 12:45p with SS,NP.

## 2024-01-19 NOTE — Telephone Encounter (Signed)
 Received, thank you

## 2024-01-19 NOTE — Telephone Encounter (Signed)
 PAP: Patient has been denied for patient assistance by [employer] ([employer]MS) due to 3% out of pocket spending requirement not met. Letter has been sent to patient.

## 2024-01-20 ENCOUNTER — Ambulatory Visit: Admitting: Neurology

## 2024-01-20 ENCOUNTER — Encounter: Payer: Self-pay | Admitting: Neurology

## 2024-01-20 ENCOUNTER — Telehealth: Payer: Self-pay | Admitting: Cardiovascular Disease

## 2024-01-20 VITALS — BP 120/75 | HR 64 | Ht 70.0 in | Wt 265.5 lb

## 2024-01-20 DIAGNOSIS — G4733 Obstructive sleep apnea (adult) (pediatric): Secondary | ICD-10-CM | POA: Diagnosis not present

## 2024-01-20 NOTE — Telephone Encounter (Signed)
 The 3% figure may not have anything to do with the income documents he provided. BMS does a credit check and often times what they pull is different than what patient says they make. BMS does not provide us  with a figure for the 3% in the denial letter. We have to call the company and ask what they based the 3% on. Patient can inquire with them directly on this as well. I will reach back out to BMS and get clarification on what spending requirement still needed to be met. Will reach back out to patient via mychart once I have a response.

## 2024-01-20 NOTE — Patient Instructions (Signed)
 Great to see you today! Continue superb CPAP use! I am going to increase your pressure range slightly.  I will contact you in about 6 weeks with updated data.  Continue to use CPAP nightly minimum 4 hours.  Please call if any issues.  Follow-up in 1 year.  Thanks!!

## 2024-01-20 NOTE — Telephone Encounter (Signed)
 BMS unable to give the 3% figure but stated patient may be eligible again in November if they provide additional RX spending.

## 2024-01-20 NOTE — Telephone Encounter (Signed)
 Patient states he is returning a call.  Please advise.
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
## 2024-01-20 NOTE — Telephone Encounter (Signed)
 Criss Fleeting, [facility]    01/19/24  3:12 PM Note PAP: Patient has been denied for patient assistance by [employer] ([employer]) due to 3% out of pocket spending requirement not met. Letter has been sent to patient.      Called patient with Kayla's message. I will see if she can call patient and explain how he figures out what is the 3%?

## 2024-01-25 DIAGNOSIS — E1165 Type 2 diabetes mellitus with hyperglycemia: Secondary | ICD-10-CM | POA: Diagnosis not present

## 2024-01-28 DIAGNOSIS — H52222 Regular astigmatism, left eye: Secondary | ICD-10-CM | POA: Diagnosis not present

## 2024-01-28 DIAGNOSIS — H25812 Combined forms of age-related cataract, left eye: Secondary | ICD-10-CM | POA: Diagnosis not present

## 2024-01-28 DIAGNOSIS — E1136 Type 2 diabetes mellitus with diabetic cataract: Secondary | ICD-10-CM | POA: Diagnosis not present

## 2024-01-28 DIAGNOSIS — G4733 Obstructive sleep apnea (adult) (pediatric): Secondary | ICD-10-CM | POA: Diagnosis not present

## 2024-01-30 DIAGNOSIS — G4733 Obstructive sleep apnea (adult) (pediatric): Secondary | ICD-10-CM | POA: Diagnosis not present

## 2024-02-03 DIAGNOSIS — E1165 Type 2 diabetes mellitus with hyperglycemia: Secondary | ICD-10-CM | POA: Diagnosis not present

## 2024-02-04 ENCOUNTER — Other Ambulatory Visit: Payer: Self-pay | Admitting: Family Medicine

## 2024-02-10 DIAGNOSIS — E1165 Type 2 diabetes mellitus with hyperglycemia: Secondary | ICD-10-CM | POA: Diagnosis not present

## 2024-02-10 DIAGNOSIS — E7800 Pure hypercholesterolemia, unspecified: Secondary | ICD-10-CM | POA: Diagnosis not present

## 2024-02-10 DIAGNOSIS — I10 Essential (primary) hypertension: Secondary | ICD-10-CM | POA: Diagnosis not present

## 2024-02-10 LAB — HEMOGLOBIN A1C: Hemoglobin A1C: 9.1

## 2024-02-15 ENCOUNTER — Encounter: Payer: Medicare HMO | Admitting: *Deleted

## 2024-02-15 VITALS — BP 179/71 | HR 74 | Temp 98.2°F | Resp 18

## 2024-02-15 DIAGNOSIS — Z9842 Cataract extraction status, left eye: Secondary | ICD-10-CM

## 2024-02-15 DIAGNOSIS — Z006 Encounter for examination for normal comparison and control in clinical research program: Secondary | ICD-10-CM

## 2024-02-15 DIAGNOSIS — Z794 Long term (current) use of insulin: Secondary | ICD-10-CM

## 2024-02-15 MED ORDER — STUDY - ORION 4 - INCLISIRAN 300 MG/1.5 ML OR PLACEBO SQ INJECTION (PI-STUCKEY)
300.0000 mg | INJECTION | SUBCUTANEOUS | Status: AC
  Administered 2024-02-15: 300 mg via SUBCUTANEOUS
  Filled 2024-02-15: qty 1.5

## 2024-02-15 NOTE — Research (Signed)
 Orion 4  Patient is doing well. His leg is healing.  IP given.  Will see back in January.   Suzen Hardy :) RN BSN  Clinical Research Nurse  Be strong and take heart, all you who hope in the [place]. ~ Psalm 31:24

## 2024-02-25 DIAGNOSIS — E1165 Type 2 diabetes mellitus with hyperglycemia: Secondary | ICD-10-CM | POA: Diagnosis not present

## 2024-03-01 DIAGNOSIS — G4733 Obstructive sleep apnea (adult) (pediatric): Secondary | ICD-10-CM | POA: Diagnosis not present

## 2024-03-05 ENCOUNTER — Other Ambulatory Visit: Payer: Self-pay | Admitting: Cardiovascular Disease

## 2024-03-07 ENCOUNTER — Encounter: Payer: Self-pay | Admitting: Neurology

## 2024-03-14 ENCOUNTER — Ambulatory Visit (INDEPENDENT_AMBULATORY_CARE_PROVIDER_SITE_OTHER)

## 2024-03-14 VITALS — Ht 70.0 in | Wt 265.0 lb

## 2024-03-14 DIAGNOSIS — Z0000 Encounter for general adult medical examination without abnormal findings: Secondary | ICD-10-CM | POA: Diagnosis not present
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
## 2024-03-14 NOTE — Patient Instructions (Signed)
 [name] , Thank you for taking time out of your busy schedule to complete your Annual Wellness Visit with me. I enjoyed our conversation and look forward to speaking with you again next year. I, as well as your care team,  appreciate your ongoing commitment to your health goals. Please review the following plan we discussed and let me know if I can assist you in the future. Your Game plan/ To Do List     Follow up Visits: We will see or speak with you next year for your Next Medicare AWV with our clinical staff Have you seen your provider in the last 6 months (3 months if uncontrolled diabetes)? No  Clinician Recommendations:  Aim for 30 minutes of exercise or brisk walking, 6-8 glasses of water , and 5 servings of fruits and vegetables each day.       This is a list of the screenings recommended for you:  Health Maintenance  Topic Date Due   Yearly kidney health urinalysis for diabetes  09/06/2013   COVID-19 Vaccine (5 - 2024-25 season) 03/21/2023   Eye exam for diabetics  11/12/2023   Flu Shot  02/18/2024   Colon Cancer Screening  04/21/2024   Yearly kidney function blood test for diabetes  07/01/2024   Hemoglobin A1C  08/12/2024   Medicare Annual Wellness Visit  03/14/2025   DTaP/Tdap/Td vaccine (3 - Td or Tdap) 12/17/2028   Pneumococcal Vaccine for age over 96  Completed   Hepatitis C Screening  Completed   Zoster (Shingles) Vaccine  Completed   HPV Vaccine  Aged Out   Meningitis B Vaccine  Aged Out   Complete foot exam   Discontinued    Advanced directives: (ACP Link)Information on Advanced Care Planning can be found at [place]  Secretary of [facility] Advance Health Care Directives Advance Health Care Directives. [email or web address]   Advance Care Planning is important because it:  [x]  Makes sure you receive the medical care that is consistent with your values, goals, and preferences  [x]  It provides guidance to your family and loved ones and reduces their decisional burden about  whether or not they are making the right decisions based on your wishes.  Follow the link provided in your after visit summary or read over the paperwork we have mailed to you to help you started getting your Advance Directives in place. If you need assistance in completing these, please reach out to us  so that we can help you!  See attachments for Preventive Care and Fall Prevention Tips.

## 2024-03-27 DIAGNOSIS — E1165 Type 2 diabetes mellitus with hyperglycemia: Secondary | ICD-10-CM | POA: Diagnosis not present

## 2024-03-29 DIAGNOSIS — G4733 Obstructive sleep apnea (adult) (pediatric): Secondary | ICD-10-CM | POA: Diagnosis not present

## 2024-04-01 DIAGNOSIS — G4733 Obstructive sleep apnea (adult) (pediatric): Secondary | ICD-10-CM | POA: Diagnosis not present

## 2024-04-12 DIAGNOSIS — G8929 Other chronic pain: Secondary | ICD-10-CM | POA: Diagnosis not present

## 2024-04-12 DIAGNOSIS — M549 Dorsalgia, unspecified: Secondary | ICD-10-CM | POA: Diagnosis not present

## 2024-04-12 DIAGNOSIS — R079 Chest pain, unspecified: Secondary | ICD-10-CM | POA: Diagnosis not present

## 2024-04-12 DIAGNOSIS — M546 Pain in thoracic spine: Secondary | ICD-10-CM | POA: Diagnosis not present

## 2024-04-13 DIAGNOSIS — M47816 Spondylosis without myelopathy or radiculopathy, lumbar region: Secondary | ICD-10-CM | POA: Diagnosis not present

## 2024-04-24 ENCOUNTER — Other Ambulatory Visit: Payer: Self-pay | Admitting: Internal Medicine

## 2024-04-24 ENCOUNTER — Other Ambulatory Visit: Payer: Self-pay | Admitting: Urology

## 2024-04-26 DIAGNOSIS — E1165 Type 2 diabetes mellitus with hyperglycemia: Secondary | ICD-10-CM | POA: Diagnosis not present

## 2024-05-01 DIAGNOSIS — G4733 Obstructive sleep apnea (adult) (pediatric): Secondary | ICD-10-CM | POA: Diagnosis not present

## 2024-05-11 DIAGNOSIS — E1165 Type 2 diabetes mellitus with hyperglycemia: Secondary | ICD-10-CM | POA: Diagnosis not present

## 2024-05-18 DIAGNOSIS — I2510 Atherosclerotic heart disease of native coronary artery without angina pectoris: Secondary | ICD-10-CM | POA: Diagnosis not present

## 2024-05-18 DIAGNOSIS — E1121 Type 2 diabetes mellitus with diabetic nephropathy: Secondary | ICD-10-CM | POA: Diagnosis not present

## 2024-05-18 DIAGNOSIS — L97519 Non-pressure chronic ulcer of other part of right foot with unspecified severity: Secondary | ICD-10-CM | POA: Diagnosis not present

## 2024-05-18 DIAGNOSIS — N1831 Chronic kidney disease, stage 3a: Secondary | ICD-10-CM | POA: Diagnosis not present

## 2024-05-18 DIAGNOSIS — E1140 Type 2 diabetes mellitus with diabetic neuropathy, unspecified: Secondary | ICD-10-CM | POA: Diagnosis not present

## 2024-05-18 DIAGNOSIS — E7800 Pure hypercholesterolemia, unspecified: Secondary | ICD-10-CM | POA: Diagnosis not present

## 2024-05-18 DIAGNOSIS — E1165 Type 2 diabetes mellitus with hyperglycemia: Secondary | ICD-10-CM | POA: Diagnosis not present

## 2024-05-18 DIAGNOSIS — I10 Essential (primary) hypertension: Secondary | ICD-10-CM | POA: Diagnosis not present

## 2024-05-27 DIAGNOSIS — E1165 Type 2 diabetes mellitus with hyperglycemia: Secondary | ICD-10-CM | POA: Diagnosis not present

## 2024-06-13 NOTE — Progress Notes (Addendum)
 [name]                                          MRN: [id number]   06/13/2024   The VBCI Quality Team Specialist reviewed this patient medical record for the purposes of chart review for care gap closure. The following were reviewed: chart review for care gap closure-glycemic status assessment. Abstracted most recent CBP  08/01/2024- NO NEW CBP TO CLOSE 2025  08/09/2024- no new gsd to close.    VBCI Quality Team

## 2024-06-26 DIAGNOSIS — E1165 Type 2 diabetes mellitus with hyperglycemia: Secondary | ICD-10-CM | POA: Diagnosis not present

## 2024-07-25 ENCOUNTER — Telehealth: Payer: Self-pay | Admitting: Cardiovascular Disease

## 2024-07-25 NOTE — Telephone Encounter (Signed)
 Pt c/o medication issue:  1. Name of Medication:   apixaban  (ELIQUIS ) 5 MG TABS tablet    2. How are you currently taking this medication (dosage and times per day)? As written  3. Are you having a reaction (difficulty breathing--STAT)? no  4. What is your medication issue? Pt states he cannot afford it. Please advise

## 2024-08-03 NOTE — Telephone Encounter (Signed)
 Hi, this patient was denied bms assistance for eliquis  and he does not have cardiomyopathy so he does not qualify for a grant. Would he be able to be changed to xarelto? If he can, can someone please have the provider to sign the xarelto provider application that is scanned in media? Thank you!

## 2024-08-04 ENCOUNTER — Telehealth: Payer: Self-pay | Admitting: Pharmacy Technician

## 2024-08-04 ENCOUNTER — Other Ambulatory Visit: Payer: Self-pay | Admitting: Vascular Surgery

## 2024-08-04 DIAGNOSIS — I739 Peripheral vascular disease, unspecified: Secondary | ICD-10-CM

## 2024-08-04 NOTE — Telephone Encounter (Signed)
 Patient called and would like to change to xarelto since there is no other assistance for eliquis . He was denied by bms and does not have cardiomyopathy  Message sent to the provider to ask if ok for change in another encounter and application scanned in media for xarelto  Will need to send application for xarelto to patient as well

## 2024-08-08 NOTE — Telephone Encounter (Signed)
 Court Dorn PARAS, MD to Me (Selected Message)     08/07/24  4:00 PM Yes, okay to switch to Xarelto.  Pt assistance application signed by Dr. Court. Faxed back to Pt asst team.

## 2024-08-08 NOTE — Telephone Encounter (Signed)
"    08/07/24  4:00 PM Yes, okay to switch to Xarelto.   Pt assistance application signed by Dr. Court. Faxed back to Pt asst team. "

## 2024-08-08 NOTE — Telephone Encounter (Signed)
" ° °  PAP: Patient assistance application for Xarelto through [employer] has been mailed to pt's home address on file. Provider portion of application will be faxed to provider's office.  -provider portion in media "

## 2024-08-09 NOTE — Telephone Encounter (Signed)
 Hi, I got the provider portion back but it didn't specify which mg for the xarleto. Is it going to be xarelto 20mg  1 tablet daily?

## 2024-08-11 NOTE — Telephone Encounter (Signed)
 SABRA

## 2024-08-11 NOTE — Telephone Encounter (Signed)
 Patient said he will bring in the xarelto application in a week or two

## 2024-08-15 ENCOUNTER — Encounter

## 2024-08-15 ENCOUNTER — Other Ambulatory Visit: Payer: Self-pay | Admitting: Vascular Surgery

## 2024-08-15 DIAGNOSIS — Z95828 Presence of other vascular implants and grafts: Secondary | ICD-10-CM

## 2024-08-15 DIAGNOSIS — I739 Peripheral vascular disease, unspecified: Secondary | ICD-10-CM

## 2024-08-23 ENCOUNTER — Encounter: Admitting: *Deleted

## 2024-08-23 VITALS — BP 156/53 | HR 58 | Temp 98.4°F | Resp 16

## 2024-08-23 DIAGNOSIS — Z006 Encounter for examination for normal comparison and control in clinical research program: Secondary | ICD-10-CM

## 2024-08-23 MED ORDER — STUDY - ORION 4 - INCLISIRAN 300 MG/1.5 ML OR PLACEBO SQ INJECTION (PI-CHRISTOPHER)
300.0000 mg | INJECTION | SUBCUTANEOUS | Status: AC
  Administered 2024-08-23: 300 mg via SUBCUTANEOUS
  Filled 2024-08-23: qty 1.5

## 2024-08-23 NOTE — Research (Signed)
 [facility]  Patient doing well, no complaints  Going to see cardiology this month.  No med changes according to patient  Will see patient for his eos visit in August 2026  Suzen Hardy :) RN BSN  Clinical Research Nurse  Be strong and take heart, all you who hope in the Lord. ~ Psalm 31:24

## 2024-08-23 NOTE — Telephone Encounter (Signed)
 I called the patient and he said he will bring on the 19th

## 2024-09-07 ENCOUNTER — Ambulatory Visit: Admitting: Vascular Surgery

## 2024-09-07 ENCOUNTER — Ambulatory Visit (HOSPITAL_COMMUNITY)

## 2025-01-25 ENCOUNTER — Ambulatory Visit: Admitting: Neurology

## 2025-02-20 ENCOUNTER — Encounter

## 2025-03-27 ENCOUNTER — Ambulatory Visit
